# Patient Record
Sex: Female | Born: 1950
Health system: Southern US, Community
[De-identification: ages and names within clinical notes are randomized; demographics above are authoritative.]

## PROBLEM LIST (undated history)

## (undated) DIAGNOSIS — M5136 Other intervertebral disc degeneration, lumbar region: Secondary | ICD-10-CM

## (undated) DIAGNOSIS — K219 Gastro-esophageal reflux disease without esophagitis: Secondary | ICD-10-CM

## (undated) DIAGNOSIS — N189 Chronic kidney disease, unspecified: Secondary | ICD-10-CM

## (undated) DIAGNOSIS — F329 Major depressive disorder, single episode, unspecified: Secondary | ICD-10-CM

## (undated) DIAGNOSIS — I639 Cerebral infarction, unspecified: Secondary | ICD-10-CM

## (undated) DIAGNOSIS — R269 Unspecified abnormalities of gait and mobility: Principal | ICD-10-CM

## (undated) DIAGNOSIS — G971 Other reaction to spinal and lumbar puncture: Secondary | ICD-10-CM

## (undated) DIAGNOSIS — J310 Chronic rhinitis: Secondary | ICD-10-CM

## (undated) DIAGNOSIS — Z5189 Encounter for other specified aftercare: Secondary | ICD-10-CM

## (undated) DIAGNOSIS — F419 Anxiety disorder, unspecified: Secondary | ICD-10-CM

## (undated) DIAGNOSIS — IMO0002 Reserved for concepts with insufficient information to code with codable children: Secondary | ICD-10-CM

## (undated) DIAGNOSIS — H269 Unspecified cataract: Secondary | ICD-10-CM

## (undated) DIAGNOSIS — R0600 Dyspnea, unspecified: Secondary | ICD-10-CM

## (undated) DIAGNOSIS — D649 Anemia, unspecified: Secondary | ICD-10-CM

## (undated) DIAGNOSIS — M978XXA Periprosthetic fracture around other internal prosthetic joint, initial encounter: Secondary | ICD-10-CM

## (undated) DIAGNOSIS — J189 Pneumonia, unspecified organism: Secondary | ICD-10-CM

## (undated) DIAGNOSIS — T7840XA Allergy, unspecified, initial encounter: Secondary | ICD-10-CM

## (undated) DIAGNOSIS — E785 Hyperlipidemia, unspecified: Secondary | ICD-10-CM

## (undated) DIAGNOSIS — G709 Myoneural disorder, unspecified: Secondary | ICD-10-CM

## (undated) DIAGNOSIS — G4730 Sleep apnea, unspecified: Secondary | ICD-10-CM

## (undated) DIAGNOSIS — H44009 Unspecified purulent endophthalmitis, unspecified eye: Secondary | ICD-10-CM

## (undated) DIAGNOSIS — Z96659 Presence of unspecified artificial knee joint: Secondary | ICD-10-CM

## (undated) DIAGNOSIS — S7225XA Nondisplaced subtrochanteric fracture of left femur, initial encounter for closed fracture: Secondary | ICD-10-CM

## (undated) DIAGNOSIS — K922 Gastrointestinal hemorrhage, unspecified: Secondary | ICD-10-CM

## (undated) DIAGNOSIS — K5000 Crohn's disease of small intestine without complications: Secondary | ICD-10-CM

## (undated) DIAGNOSIS — G459 Transient cerebral ischemic attack, unspecified: Secondary | ICD-10-CM

## (undated) DIAGNOSIS — F32A Depression, unspecified: Secondary | ICD-10-CM

## (undated) DIAGNOSIS — I2510 Atherosclerotic heart disease of native coronary artery without angina pectoris: Secondary | ICD-10-CM

## (undated) DIAGNOSIS — I10 Essential (primary) hypertension: Secondary | ICD-10-CM

## (undated) DIAGNOSIS — R569 Unspecified convulsions: Secondary | ICD-10-CM

## (undated) DIAGNOSIS — M1990 Unspecified osteoarthritis, unspecified site: Secondary | ICD-10-CM

## (undated) DIAGNOSIS — E039 Hypothyroidism, unspecified: Secondary | ICD-10-CM

## (undated) DIAGNOSIS — M797 Fibromyalgia: Secondary | ICD-10-CM

## (undated) DIAGNOSIS — E559 Vitamin D deficiency, unspecified: Secondary | ICD-10-CM

## (undated) DIAGNOSIS — R7303 Prediabetes: Secondary | ICD-10-CM

## (undated) DIAGNOSIS — M51369 Other intervertebral disc degeneration, lumbar region without mention of lumbar back pain or lower extremity pain: Secondary | ICD-10-CM

## (undated) DIAGNOSIS — H409 Unspecified glaucoma: Secondary | ICD-10-CM

## (undated) DIAGNOSIS — G2581 Restless legs syndrome: Secondary | ICD-10-CM

## (undated) HISTORY — DX: Unspecified purulent endophthalmitis, unspecified eye: H44.009

## (undated) HISTORY — PX: FRACTURE SURGERY: SHX138

## (undated) HISTORY — DX: Unspecified cataract: H26.9

## (undated) HISTORY — DX: Unspecified convulsions: R56.9

## (undated) HISTORY — DX: Depression, unspecified: F32.A

## (undated) HISTORY — DX: Crohn's disease of small intestine without complications: K50.00

## (undated) HISTORY — PX: TOE SURGERY: SHX1073

## (undated) HISTORY — PX: JOINT REPLACEMENT: SHX530

## (undated) HISTORY — PX: SPINAL CORD DECOMPRESSION: SHX97

## (undated) HISTORY — DX: Unspecified glaucoma: H40.9

## (undated) HISTORY — PX: SPINE SURGERY: SHX786

## (undated) HISTORY — DX: Unspecified abnormalities of gait and mobility: R26.9

## (undated) HISTORY — DX: Chronic rhinitis: J31.0

## (undated) HISTORY — DX: Chronic kidney disease, unspecified: N18.9

## (undated) HISTORY — DX: Other intervertebral disc degeneration, lumbar region: M51.36

## (undated) HISTORY — DX: Encounter for other specified aftercare: Z51.89

## (undated) HISTORY — DX: Fibromyalgia: M79.7

## (undated) HISTORY — DX: Hyperlipidemia, unspecified: E78.5

## (undated) HISTORY — DX: Nondisplaced subtrochanteric fracture of left femur, initial encounter for closed fracture: S72.25XA

## (undated) HISTORY — DX: Restless legs syndrome: G25.81

## (undated) HISTORY — DX: Reserved for concepts with insufficient information to code with codable children: IMO0002

## (undated) HISTORY — DX: Vitamin D deficiency, unspecified: E55.9

## (undated) HISTORY — PX: BACK SURGERY: SHX140

## (undated) HISTORY — DX: Hypothyroidism, unspecified: E03.9

## (undated) HISTORY — PX: FINGER ARTHROSCOPY: SHX5001

## (undated) HISTORY — DX: Gastro-esophageal reflux disease without esophagitis: K21.9

## (undated) HISTORY — PX: EYE SURGERY: SHX253

## (undated) HISTORY — DX: Anxiety disorder, unspecified: F41.9

## (undated) HISTORY — DX: Essential (primary) hypertension: I10

## (undated) HISTORY — DX: Major depressive disorder, single episode, unspecified: F32.9

## (undated) HISTORY — DX: Other intervertebral disc degeneration, lumbar region without mention of lumbar back pain or lower extremity pain: M51.369

## (undated) HISTORY — PX: CATARACT EXTRACTION, BILATERAL: SHX1313

## (undated) HISTORY — PX: SHOULDER ADHESION RELEASE: SHX773

## (undated) HISTORY — DX: Atherosclerotic heart disease of native coronary artery without angina pectoris: I25.10

## (undated) HISTORY — PX: KNEE SURGERY: SHX244

## (undated) HISTORY — DX: Allergy, unspecified, initial encounter: T78.40XA

---
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
## 1990-06-30 HISTORY — PX: ABDOMINAL HYSTERECTOMY: SHX81

## 1998-11-12 ENCOUNTER — Other Ambulatory Visit: Admission: RE | Admit: 1998-11-12 | Discharge: 1998-11-12 | Payer: Self-pay | Admitting: Obstetrics and Gynecology

## 1999-02-07 ENCOUNTER — Ambulatory Visit (HOSPITAL_COMMUNITY): Admission: RE | Admit: 1999-02-07 | Discharge: 1999-02-07 | Payer: Self-pay | Admitting: *Deleted

## 2000-08-04 ENCOUNTER — Ambulatory Visit (HOSPITAL_COMMUNITY): Admission: RE | Admit: 2000-08-04 | Discharge: 2000-08-04 | Payer: Self-pay | Admitting: Otolaryngology

## 2000-08-04 ENCOUNTER — Encounter: Payer: Self-pay | Admitting: Otolaryngology

## 2000-10-08 ENCOUNTER — Other Ambulatory Visit: Admission: RE | Admit: 2000-10-08 | Discharge: 2000-10-08 | Payer: Self-pay | Admitting: Otolaryngology

## 2000-10-08 ENCOUNTER — Encounter (INDEPENDENT_AMBULATORY_CARE_PROVIDER_SITE_OTHER): Payer: Self-pay | Admitting: Specialist

## 2000-12-14 ENCOUNTER — Ambulatory Visit (HOSPITAL_COMMUNITY): Admission: RE | Admit: 2000-12-14 | Discharge: 2000-12-14 | Payer: Self-pay | Admitting: Internal Medicine

## 2000-12-14 ENCOUNTER — Encounter: Payer: Self-pay | Admitting: Internal Medicine

## 2000-12-21 ENCOUNTER — Encounter: Payer: Self-pay | Admitting: Internal Medicine

## 2000-12-21 ENCOUNTER — Ambulatory Visit (HOSPITAL_COMMUNITY): Admission: RE | Admit: 2000-12-21 | Discharge: 2000-12-21 | Payer: Self-pay | Admitting: Internal Medicine

## 2001-01-26 ENCOUNTER — Ambulatory Visit (HOSPITAL_BASED_OUTPATIENT_CLINIC_OR_DEPARTMENT_OTHER): Admission: RE | Admit: 2001-01-26 | Discharge: 2001-01-26 | Payer: Self-pay | Admitting: Otolaryngology

## 2001-01-26 ENCOUNTER — Encounter (INDEPENDENT_AMBULATORY_CARE_PROVIDER_SITE_OTHER): Payer: Self-pay | Admitting: *Deleted

## 2002-03-15 ENCOUNTER — Encounter: Payer: Self-pay | Admitting: Internal Medicine

## 2002-03-15 ENCOUNTER — Ambulatory Visit (HOSPITAL_COMMUNITY): Admission: RE | Admit: 2002-03-15 | Discharge: 2002-03-15 | Payer: Self-pay | Admitting: Internal Medicine

## 2002-10-24 ENCOUNTER — Ambulatory Visit (HOSPITAL_COMMUNITY): Admission: RE | Admit: 2002-10-24 | Discharge: 2002-10-24 | Payer: Self-pay | Admitting: Internal Medicine

## 2002-10-24 ENCOUNTER — Encounter: Payer: Self-pay | Admitting: Internal Medicine

## 2003-07-01 HISTORY — PX: ROTATOR CUFF REPAIR: SHX139

## 2003-10-24 ENCOUNTER — Ambulatory Visit (HOSPITAL_COMMUNITY): Admission: RE | Admit: 2003-10-24 | Discharge: 2003-10-24 | Payer: Self-pay | Admitting: Neurology

## 2003-12-13 ENCOUNTER — Ambulatory Visit (HOSPITAL_COMMUNITY): Admission: RE | Admit: 2003-12-13 | Discharge: 2003-12-13 | Payer: Self-pay | Admitting: Internal Medicine

## 2004-10-18 ENCOUNTER — Ambulatory Visit: Payer: Self-pay | Admitting: Gastroenterology

## 2004-11-01 ENCOUNTER — Ambulatory Visit: Payer: Self-pay | Admitting: Gastroenterology

## 2005-04-15 ENCOUNTER — Encounter: Admission: RE | Admit: 2005-04-15 | Discharge: 2005-04-15 | Payer: Self-pay | Admitting: Orthopedic Surgery

## 2005-04-15 ENCOUNTER — Other Ambulatory Visit: Admission: RE | Admit: 2005-04-15 | Discharge: 2005-04-15 | Payer: Self-pay | Admitting: Internal Medicine

## 2005-05-02 ENCOUNTER — Ambulatory Visit (HOSPITAL_COMMUNITY): Admission: RE | Admit: 2005-05-02 | Discharge: 2005-05-02 | Payer: Self-pay | Admitting: Internal Medicine

## 2005-05-07 ENCOUNTER — Emergency Department (HOSPITAL_COMMUNITY): Admission: EM | Admit: 2005-05-07 | Discharge: 2005-05-07 | Payer: Self-pay | Admitting: *Deleted

## 2006-01-21 ENCOUNTER — Ambulatory Visit: Payer: Self-pay | Admitting: Internal Medicine

## 2006-02-12 ENCOUNTER — Ambulatory Visit: Payer: Self-pay | Admitting: Cardiology

## 2006-02-13 ENCOUNTER — Ambulatory Visit: Payer: Self-pay

## 2006-03-13 ENCOUNTER — Inpatient Hospital Stay (HOSPITAL_COMMUNITY): Admission: AD | Admit: 2006-03-13 | Discharge: 2006-03-18 | Payer: Self-pay | Admitting: Orthopedic Surgery

## 2006-12-28 ENCOUNTER — Encounter (INDEPENDENT_AMBULATORY_CARE_PROVIDER_SITE_OTHER): Payer: Self-pay | Admitting: Otolaryngology

## 2006-12-28 ENCOUNTER — Ambulatory Visit (HOSPITAL_BASED_OUTPATIENT_CLINIC_OR_DEPARTMENT_OTHER): Admission: RE | Admit: 2006-12-28 | Discharge: 2006-12-28 | Payer: Self-pay | Admitting: Otolaryngology

## 2007-03-19 ENCOUNTER — Inpatient Hospital Stay (HOSPITAL_COMMUNITY): Admission: RE | Admit: 2007-03-19 | Discharge: 2007-03-22 | Payer: Self-pay | Admitting: Orthopedic Surgery

## 2007-10-21 ENCOUNTER — Emergency Department (HOSPITAL_COMMUNITY): Admission: EM | Admit: 2007-10-21 | Discharge: 2007-10-21 | Payer: Self-pay | Admitting: Emergency Medicine

## 2007-10-26 ENCOUNTER — Ambulatory Visit (HOSPITAL_COMMUNITY): Admission: RE | Admit: 2007-10-26 | Discharge: 2007-10-26 | Payer: Self-pay | Admitting: Emergency Medicine

## 2007-11-23 ENCOUNTER — Ambulatory Visit (HOSPITAL_COMMUNITY): Admission: RE | Admit: 2007-11-23 | Discharge: 2007-11-23 | Payer: Self-pay | Admitting: Neurosurgery

## 2007-11-25 ENCOUNTER — Ambulatory Visit (HOSPITAL_COMMUNITY): Admission: RE | Admit: 2007-11-25 | Discharge: 2007-11-25 | Payer: Self-pay | Admitting: Internal Medicine

## 2007-12-23 ENCOUNTER — Inpatient Hospital Stay (HOSPITAL_COMMUNITY): Admission: RE | Admit: 2007-12-23 | Discharge: 2007-12-27 | Payer: Self-pay | Admitting: Neurosurgery

## 2008-05-02 ENCOUNTER — Ambulatory Visit (HOSPITAL_COMMUNITY): Admission: RE | Admit: 2008-05-02 | Discharge: 2008-05-02 | Payer: Self-pay | Admitting: Orthopedic Surgery

## 2008-06-01 ENCOUNTER — Ambulatory Visit (HOSPITAL_COMMUNITY): Admission: RE | Admit: 2008-06-01 | Discharge: 2008-06-01 | Payer: Self-pay | Admitting: Orthopedic Surgery

## 2008-06-01 ENCOUNTER — Ambulatory Visit: Payer: Self-pay | Admitting: Surgery

## 2008-06-01 ENCOUNTER — Encounter (INDEPENDENT_AMBULATORY_CARE_PROVIDER_SITE_OTHER): Payer: Self-pay | Admitting: Orthopedic Surgery

## 2008-08-09 ENCOUNTER — Encounter: Admission: RE | Admit: 2008-08-09 | Discharge: 2008-08-09 | Payer: Self-pay | Admitting: Gastroenterology

## 2008-12-18 ENCOUNTER — Inpatient Hospital Stay (HOSPITAL_COMMUNITY): Admission: RE | Admit: 2008-12-18 | Discharge: 2008-12-20 | Payer: Self-pay | Admitting: Orthopedic Surgery

## 2008-12-18 ENCOUNTER — Encounter (INDEPENDENT_AMBULATORY_CARE_PROVIDER_SITE_OTHER): Payer: Self-pay | Admitting: Orthopedic Surgery

## 2009-03-20 ENCOUNTER — Ambulatory Visit (HOSPITAL_COMMUNITY): Admission: RE | Admit: 2009-03-20 | Discharge: 2009-03-20 | Payer: Self-pay | Admitting: Neurology

## 2009-07-24 ENCOUNTER — Ambulatory Visit (HOSPITAL_COMMUNITY)
Admission: RE | Admit: 2009-07-24 | Discharge: 2009-07-24 | Payer: Self-pay | Source: Home / Self Care | Admitting: Internal Medicine

## 2009-08-14 ENCOUNTER — Ambulatory Visit (HOSPITAL_COMMUNITY): Admission: RE | Admit: 2009-08-14 | Discharge: 2009-08-14 | Payer: Self-pay | Admitting: Internal Medicine

## 2009-08-28 ENCOUNTER — Encounter: Admission: RE | Admit: 2009-08-28 | Discharge: 2009-08-28 | Payer: Self-pay | Admitting: Orthopedic Surgery

## 2010-06-21 DIAGNOSIS — M797 Fibromyalgia: Secondary | ICD-10-CM | POA: Insufficient documentation

## 2010-06-21 DIAGNOSIS — G2581 Restless legs syndrome: Secondary | ICD-10-CM | POA: Insufficient documentation

## 2010-06-21 DIAGNOSIS — K219 Gastro-esophageal reflux disease without esophagitis: Secondary | ICD-10-CM | POA: Insufficient documentation

## 2010-06-21 DIAGNOSIS — G4733 Obstructive sleep apnea (adult) (pediatric): Secondary | ICD-10-CM | POA: Insufficient documentation

## 2010-06-21 DIAGNOSIS — I10 Essential (primary) hypertension: Secondary | ICD-10-CM | POA: Insufficient documentation

## 2010-06-21 DIAGNOSIS — K5090 Crohn's disease, unspecified, without complications: Secondary | ICD-10-CM | POA: Insufficient documentation

## 2010-06-25 ENCOUNTER — Encounter: Payer: Self-pay | Admitting: Internal Medicine

## 2010-06-25 ENCOUNTER — Ambulatory Visit: Payer: Self-pay | Admitting: Internal Medicine

## 2010-06-25 DIAGNOSIS — J310 Chronic rhinitis: Secondary | ICD-10-CM

## 2010-06-25 HISTORY — DX: Chronic rhinitis: J31.0

## 2010-06-25 LAB — CONVERTED CEMR LAB: IgE (Immunoglobulin E), Serum: 52.8 intl units/mL (ref 0.0–180.0)

## 2010-07-02 ENCOUNTER — Telehealth (INDEPENDENT_AMBULATORY_CARE_PROVIDER_SITE_OTHER): Payer: Self-pay | Admitting: *Deleted

## 2010-07-11 ENCOUNTER — Ambulatory Visit (HOSPITAL_COMMUNITY)
Admission: RE | Admit: 2010-07-11 | Discharge: 2010-07-11 | Payer: Self-pay | Source: Home / Self Care | Attending: Internal Medicine | Admitting: Internal Medicine

## 2010-08-01 NOTE — Assessment & Plan Note (Signed)
Summary: allergies/jd   Vital Signs:  Patient profile:   60 year old female Weight:      191.38 pounds O2 Sat:      93 % on Room air Pulse rate:   115 / minute BP sitting:   134 / 76  (left arm) Cuff size:   regular  Vitals Entered By: Reynaldo Minium CMA (June 25, 2010 2:32 PM)  O2 Flow:  Room air   Primary Provider/Referring Provider:  Oneta Rack   History of Present Illness: [birth year]89 yoF referred courtesy of Dr Oneta Rack asking allergy evaluation. Nonsmoker who had worked with Dr Rema Jasmine on allergy complaints as far back as 1965 when she was taking antihistamines and allergy shots. She gave her own vaccine then, but kept drifting off. She felt the shots helped but she relapsed each time she stopped. Recently using Claritin-D, nasal sprays, antibiotic ear drops and prednisone. She describes ear pressure and drainage, eyes water and burn, tension type bitemporal headaches, postnasal drip, sneezing. Denies cough, chest tightness, asthma dx or wheeze. Hx Bells Palsy and right eyelid droops. Skin itch on contact with some fragrances, otherwise no allergic skin problem and no problem with latex, foods, insects.  Has cat.  Preventive Screening-Counseling & Management  Alcohol-Tobacco     Smoking Status: never  Allergies (verified): 1)  ! Codeine 2)  ! Sulfa 3)  ! * Emycin 4)  ! * Flaxseed 5)  ! * Pravastain 6)  ! Lipitor 7)  ! Morphine 8)  ! Nsaids  Past History:  Family History: Last updated: 06/25/2010 Heart Disease: father-bypass, mother-pacemaker uncle(deceased) MI RA: mother-osetoarthritis,sister-lupus, sister-RA Cancer-mother-blood disorder, sister-luekimia  Social History: Last updated: 06/25/2010 Retired Engineer, site- disbility Married with 2 grown children ETOH-rare to social Patient never smoked.   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  Smoking Status:  never  Review of Systems      See HPI       The patient complains of shortness of breath with activity, shortness of breath at rest, acid heartburn, indigestion, abdominal pain, headaches, nasal congestion/difficulty breathing through nose, sneezing, itching, ear ache, hand/feet swelling, and joint stiffness or pain.  The patient denies productive cough, non-productive cough, coughing up blood, chest pain, irregular heartbeats, loss of appetite, weight change, difficulty swallowing, sore throat, tooth/dental problems, anxiety, depression, rash, change in color of mucus, and fever.    Physical Exam  Additional Exam:  General: A/Ox3; pleasant and cooperative, NAD, SKIN: no rash, lesions NODES: no lymphadenopathy HEENT: [place]/AT, EOM- WNL, Conjuctivae- clear, PERRLA, EARS- crust and debris in canals, right TM is red, not bulging, Nose- clear, Throat- clear and wnl, Mallampati  II NECK: Supple w/ fair ROM, JVD- none, normal carotid impulses w/o bruits Thyroid- normal to palpation CHEST: Clear to P&A HEART: RRR, no m/g/r heard ABDOMEN: Soft and nl; nml bowel sounds; no organomegaly or masses noted WNU:UVOZ, nl pulses, no edema  NEURO: Grossly intact to observation      Impression & Recommendations:  Problem # 1:  ALLERGIC RHINITIS (ICD-477.9) Chronic perennial and seasonal rhinitis by description. She  is not clear now about environmental  triggers as opposed to irritants. We will inventory her IgE status. I have begun discussion of environmental dust and mold precautions.   Problem # 2:  OTITIS EXTERNA (ICD-380.10) She has been treated and is encouraged to resume and finish prescribed ear drops. If she fails to clear, it may be eczema or fungal rather than allergic, and she may benefit from ENT.   Problem # 3:  OBSTRUCTIVE SLEEP APNEA (ICD-327.23)  We disciussed this briefly and can work with her on it if needed.   Medications Added to Medication List This Visit: 1)  Omeprazole 20 Mg Cpdr (Omeprazole) .... Take 1 by mouth once daily 2)  Neurontin 300 Mg Caps (Gabapentin) .... Take 1 by mouth four times a day  Other Orders: New Patient Level IV (72536) T-Allergy Profile Region II-[place], [place], [place], [place], [place] [phone])  Patient Instructions: 1)  Return as able for allergy skin testing. Stop all antihistamines 3 days before skin testing, including cold and allergy meds, otc sleep and cough meds.  2)  Lab 3)  Please use up your current ear drop as instructed.  4)  We can work on your sleep apnea issues if needed.    Orders Added: 1)  New Patient Level IV [34742] 2)  T-Allergy Profile Region [place], [place], [place], [place], [place] [[id number]]

## 2010-08-01 NOTE — Progress Notes (Signed)
Summary: results/cb  Phone Note Call from Patient Call back at Home Phone [phone]   Caller: Patient Call For: young Summary of Call: wants results of labs Initial call taken by: Lacinda Axon,  July 02, 2010 11:13 AM  Follow-up for Phone Call        Pls advise recent labs from 06/25/10, thanks! Follow-up by: Vernie Murders,  July 02, 2010 2:34 PM  Additional Follow-up for Phone Call Additional follow up Details #1::        Allergy profile does not show elevation of circulating antibodies in her blood stream. This makes allergy less likely, but doesn't rule it out. I can discuss with her when she comes in February.  Additional Follow-up by: Waymon Budge MD,  July 02, 2010 5:29 PM    Additional Follow-up for Phone Call Additional follow up Details #2::    Pt is aware of results and appt with CDY-will go over more in detail at Feb appt.Reynaldo Minium CMA  July 02, 2010 5:35 PM

## 2010-08-20 ENCOUNTER — Telehealth: Payer: Self-pay | Admitting: Internal Medicine

## 2010-08-20 ENCOUNTER — Institutional Professional Consult (permissible substitution): Payer: Self-pay | Admitting: Internal Medicine

## 2010-09-05 NOTE — Progress Notes (Signed)
Summary: allergy skin test date-LMTCB x 1  Phone Note Call from Patient   Caller: Patient Call For: young Reason for Call: Talk to Nurse Summary of Call: Wants to talk to [facility] in ref to her allergy testing. Initial call taken by: Netta Neat,  August 20, 2010 12:22 PM  Follow-up for Phone Call        Pt had to resched allergy testing and first opening was 3/26.  She is not happy with this date and would like to have this done sooner.  Pls advise thanks Follow-up by: Tilden Dome,  August 20, 2010 12:37 PM  Additional Follow-up for Phone Call Additional follow up Details #1::        I have called and left message for pt to call back to see if 09-17-2010 at 215pm will work for pt otherwise it will be first part of April for next skin testing day. Pt to call back and ask for Katie as I have held the slot for pt.Clayborne Dana CMA  August 21, 2010 3:07 PM     Additional Follow-up for Phone Call Additional follow up Details #2::    Patient calling back. #[phone]  Mateo Flow  August 22, 2010 10:33 AM   After reviewing the schedule-09-17-10 is our "go-live" for Epic and will not work so we need to work with pt for next open slot to do an allergy test-please call pt. Thanks.Clayborne Dana CMA  August 23, 2010 1:51 PM   LMOMTCB Tilden Dome  August 23, 2010 1:52 PM  pt ok with keeping 09-23-10 appt.Big [place]pp[place] Bing CMA  August 26, 2010 5:45 PM

## 2010-09-13 ENCOUNTER — Encounter: Payer: Self-pay | Admitting: Internal Medicine

## 2010-09-23 ENCOUNTER — Ambulatory Visit (INDEPENDENT_AMBULATORY_CARE_PROVIDER_SITE_OTHER): Payer: Medicare Other | Admitting: Internal Medicine

## 2010-09-23 ENCOUNTER — Encounter: Payer: Self-pay | Admitting: Internal Medicine

## 2010-09-23 VITALS — BP 112/58 | HR 80 | Ht 64.5 in | Wt 191.4 lb

## 2010-09-23 DIAGNOSIS — J309 Allergic rhinitis, unspecified: Secondary | ICD-10-CM

## 2010-09-23 NOTE — Assessment & Plan Note (Signed)
Most of her described situational exacerbations, together with the negative IgE tests, favor a nonallergic rhinitis. It may still help to avoid dusts, heavy pollens and other irritant exposures.  She was disappointed that testing did not confirm her expected explanation for her symptoms, however the tests are mutually confirmatory.

## 2010-09-23 NOTE — Patient Instructions (Signed)
Allergy evaluation by blood test "Allergy profile" and by allergy skin testing does not show significant allergy by the usual IgE pathway. You can still use antihistamines, decongestants and saline nasal rinse like Neti pot.  You can use dust barrrier encasings on your pillow and mattress, and maybe a HEPA- type air cleaner in your room as ways to reduce pollens and dust exposure, to see if that helps.   If you remain very suspicious that an allergy process is important, we can look again, maybe in a different season.

## 2010-09-23 NOTE — Progress Notes (Signed)
  Subjective:    Patient ID: Crystal [name], female    DOB: [birth year], 60 y.o.   MRN: [id number]  HPI 79 yoF followed here for allergic rhinitis, obstructive sleep apnea and hx of otitis. Last here 06/26/11 on referral from Dr Oneta Rack with complaits o nasal and ear pressure, postnasal drip. Coming now for allergy skin testing. Notices watering eyes, sneeze off antihistamines. Just finished 2 rounds of antibiotics for otitis interna/ externa with Dr C. Newman.. Had been on plane trip to [place] just before this and we discussed barotrauma, climate changes. Allergy profile 06/26/10- negative, w/ total IgE 52.8.  Skin test: Appropriate controls. Punctures- negative Intradermals- negative   Review of Systems    Constitutional:   No weight loss, night sweats,  Fevers, chills, fatigue, lassitude. HEENT:   No headaches,  Difficulty swallowing,  Tooth/dental problems,  Sore throat,                CV:  No chest pain,  Orthopnea, PND, swelling in lower extremities, anasarca, dizziness, palpitations  GI  No heartburn, indigestion, abdominal pain, nausea, vomiting, diarrhea, change in bowel habits, loss of appetite  Resp: No shortness of breath with exertion or at rest.  No excess mucus, no productive cough,  No non-productive cough,  No coughing up of blood.  No change in color of mucus.  No wheezing.  No chest wall deformity  Skin: no rash or lesions.  GU: no dysuria, change in color of urine, no urgency or frequency.  No flank pain.  MS:  No joint pain or swelling.  No decreased range of motion.  No back pain.  Psych:  No change in mood or affect. No depression or anxiety.  No memory loss.  Objective:   Physical Exam General- Alert, Oriented, Affect-appropriate, Distress- none acute  Skin- rash-none, lesions- none, excoriation- none. Clammy diaphoresis  Lymphadenopathy- none  Head- atraumatic  Eyes- Gross vision intact, PERRLA, conjunctivae clear, secretions clear  Ears- Normal-  Hearing, canals, Tm L ,   R ,  Nose- Clear, Septal dev, mucus, polyps, erosion, perforation   Throat- Mallampati II , mucosa clear , drainage- none, tonsils- atrophic  Neck- flexible , trachea midline, no stridor , thyroid nl, carotid no bruit  Chest - symmetrical excursion , unlabored     Heart/CV- RRR , no murmur , no gallop  , no rub, nl s1 s2                     - JVD- none , edema- none, stasis changes- none, varices- none     Lung- clear to P&A, wheeze- none, cough- none , dullness-none, rub- none     Chest wall-   Abd- tender-no, distended-no, bowel sounds-present, HSM- no  Br/ Gen/ Rectal- Not done, not indicated  Extrem- cyanosis- none, clubbing, none, atrophy- none, strength- nl  Neuro- hx Bell's Palsey w/ droop right lid         Assessment & Plan:

## 2010-10-07 LAB — CBC
HCT: 26.4 % — ABNORMAL LOW (ref 36.0–46.0)
HCT: 29.8 % — ABNORMAL LOW (ref 36.0–46.0)
HCT: 40.2 % (ref 36.0–46.0)
Hemoglobin: 13 g/dL (ref 12.0–15.0)
Hemoglobin: 8.8 g/dL — ABNORMAL LOW (ref 12.0–15.0)
Hemoglobin: 9.9 g/dL — ABNORMAL LOW (ref 12.0–15.0)
MCHC: 32.4 g/dL (ref 30.0–36.0)
MCHC: 33.2 g/dL (ref 30.0–36.0)
MCHC: 33.2 g/dL (ref 30.0–36.0)
MCV: 85.5 fL (ref 78.0–100.0)
MCV: 85.8 fL (ref 78.0–100.0)
MCV: 86 fL (ref 78.0–100.0)
Platelets: 231 10*3/uL (ref 150–400)
Platelets: 252 10*3/uL (ref 150–400)
Platelets: 333 10*3/uL (ref 150–400)
RBC: 3.07 MIL/uL — ABNORMAL LOW (ref 3.87–5.11)
RBC: 3.47 MIL/uL — ABNORMAL LOW (ref 3.87–5.11)
RBC: 4.71 MIL/uL (ref 3.87–5.11)
RDW: 16.6 % — ABNORMAL HIGH (ref 11.5–15.5)
RDW: 16.7 % — ABNORMAL HIGH (ref 11.5–15.5)
RDW: 16.7 % — ABNORMAL HIGH (ref 11.5–15.5)
WBC: 11.3 10*3/uL — ABNORMAL HIGH (ref 4.0–10.5)
WBC: 6.8 10*3/uL (ref 4.0–10.5)
WBC: 8.8 10*3/uL (ref 4.0–10.5)

## 2010-10-07 LAB — COMPREHENSIVE METABOLIC PANEL
ALT: 15 U/L (ref 0–35)
AST: 18 U/L (ref 0–37)
Albumin: 4.1 g/dL (ref 3.5–5.2)
Alkaline Phosphatase: 45 U/L (ref 39–117)
BUN: 16 mg/dL (ref 6–23)
CO2: 30 mEq/L (ref 19–32)
Calcium: 9.1 mg/dL (ref 8.4–10.5)
Chloride: 96 mEq/L (ref 96–112)
Creatinine, Ser: 0.9 mg/dL (ref 0.4–1.2)
GFR calc Af Amer: >60 mL/min (ref >60)
GFR calc non Af Amer: >60 mL/min (ref >60)
Glucose, Bld: 95 mg/dL (ref 70–99)
Potassium: 3.9 mEq/L (ref 3.5–5.1)
Sodium: 135 mEq/L (ref 135–145)
Total Bilirubin: 0.4 mg/dL (ref 0.3–1.2)
Total Protein: 7.3 g/dL (ref 6.0–8.3)

## 2010-10-07 LAB — DIFFERENTIAL
Basophils Absolute: 0.1 10*3/uL (ref 0.0–0.1)
Basophils Relative: 1 % (ref 0–1)
Eosinophils Absolute: 0.1 10*3/uL (ref 0.0–0.7)
Eosinophils Relative: 1 % (ref 0–5)
Lymphocytes Relative: 14 % (ref 12–46)
Lymphs Abs: 1.5 10*3/uL (ref 0.7–4.0)
Monocytes Absolute: 0.8 10*3/uL (ref 0.1–1.0)
Monocytes Relative: 7 % (ref 3–12)
Neutro Abs: 8.9 10*3/uL — ABNORMAL HIGH (ref 1.7–7.7)
Neutrophils Relative %: 78 % — ABNORMAL HIGH (ref 43–77)

## 2010-10-07 LAB — ANAEROBIC CULTURE

## 2010-10-07 LAB — CROSSMATCH
ABO/RH(D): A NEG
Antibody Screen: NEGATIVE

## 2010-10-07 LAB — URINE CULTURE
Colony Count: NO GROWTH
Culture: NO GROWTH

## 2010-10-07 LAB — URINE MICROSCOPIC-ADD ON

## 2010-10-07 LAB — URINALYSIS, ROUTINE W REFLEX MICROSCOPIC
Bilirubin Urine: NEGATIVE
Glucose, UA: NEGATIVE mg/dL
Ketones, ur: NEGATIVE mg/dL
Leukocytes, UA: NEGATIVE
Nitrite: NEGATIVE
Protein, ur: NEGATIVE mg/dL
Specific Gravity, Urine: 1.022 (ref 1.005–1.030)
Urobilinogen, UA: 0.2 mg/dL (ref 0.0–1.0)
pH: 5.5 (ref 5.0–8.0)

## 2010-10-07 LAB — BASIC METABOLIC PANEL
BUN: 12 mg/dL (ref 6–23)
BUN: 16 mg/dL (ref 6–23)
CO2: 31 mEq/L (ref 19–32)
CO2: 32 mEq/L (ref 19–32)
Calcium: 8 mg/dL — ABNORMAL LOW (ref 8.4–10.5)
Calcium: 8.1 mg/dL — ABNORMAL LOW (ref 8.4–10.5)
Chloride: 101 mEq/L (ref 96–112)
Chloride: 99 mEq/L (ref 96–112)
Creatinine, Ser: 0.97 mg/dL (ref 0.4–1.2)
Creatinine, Ser: 1.09 mg/dL (ref 0.4–1.2)
GFR calc Af Amer: >60 mL/min (ref >60)
GFR calc Af Amer: >60 mL/min (ref >60)
GFR calc non Af Amer: 51 mL/min — ABNORMAL LOW (ref >60)
GFR calc non Af Amer: 59 mL/min — ABNORMAL LOW (ref >60)
Glucose, Bld: 116 mg/dL — ABNORMAL HIGH (ref 70–99)
Glucose, Bld: 128 mg/dL — ABNORMAL HIGH (ref 70–99)
Potassium: 3.9 mEq/L (ref 3.5–5.1)
Potassium: 4.3 mEq/L (ref 3.5–5.1)
Sodium: 136 mEq/L (ref 135–145)
Sodium: 136 mEq/L (ref 135–145)

## 2010-10-07 LAB — SYNOVIAL CELL COUNT + DIFF, W/ CRYSTALS
Crystals, Fluid: NONE SEEN
Eosinophils-Synovial: 0 % (ref 0–1)
Lymphocytes-Synovial Fld: 11 % (ref 0–20)
Monocyte-Macrophage-Synovial Fluid: 11 % — ABNORMAL LOW (ref 50–90)
Neutrophil, Synovial: 78 % — ABNORMAL HIGH (ref 0–25)
WBC, Synovial: 2400 /mm3 — ABNORMAL HIGH (ref 0–200)

## 2010-10-07 LAB — BODY FLUID CULTURE: Culture: NO GROWTH

## 2010-10-07 LAB — GRAM STAIN

## 2010-10-07 LAB — PROTIME-INR
INR: 1 (ref 0.00–1.49)
Prothrombin Time: 12.8 seconds (ref 11.6–15.2)

## 2010-10-07 LAB — APTT: aPTT: 30 seconds (ref 24–37)

## 2010-10-10 ENCOUNTER — Encounter: Payer: Self-pay | Admitting: Internal Medicine

## 2010-11-12 NOTE — Op Note (Signed)
NAMERAJA, CAPUTI NO.:  [id number]   MEDICAL RECORD NO.:  [id number]          PATIENT TYPE:  INP   LOCATION:  2550                         FACILITY:  [facility]   PHYSICIAN:  Lockie Pares, M.D.    DATE OF BIRTH:  Aug 19, 1950   DATE OF PROCEDURE:  03/19/2007  DATE OF DISCHARGE:                               OPERATIVE REPORT   PREOPERATIVE DIAGNOSIS:  Osteoarthritis right knee.   POSTOPERATIVE DIAGNOSIS:  Osteoarthritis right knee.   OPERATION:  Right total knee replacement (LCS  standard femur, patella,  2.5 tibia with 15 mm bearing).   SURGEON:  Lockie Pares, M.D.   ASSISTANT:  Vonita Moss. Duffy, P.A.   TOURNIQUET TIME:  Approximately 1 hour 30 minutes.   DESCRIPTION OF PROCEDURE:  Sterile prep and drape.  Exsanguination of  leg, and placed at 375 mmHg.  Straight skin incision was made.  Medial  parapatellar approach to the knee made.  The tibia was cut with a 7-  degree posterior slope, followed by anterior-posterior femoral cut, and  a 15 mm bearing.  The medial side had to be stripped off relative to a  varus deformity.  Thereafter, a 4-degree distal valgus cut was made, and  a finishing guide was next used on the femur. Remnants of the menisci  were removed.  PCL was released.  Attention was next directed at the  tibia.  The tibia was sized to be 2.5 tibia.  Nyoka Lint was cut.  The  prosthesis trial was placed and the trial of the femur. Patella was cut,  resecting about 11 mm of the patella for a standard patella.  A 3-peg  patella was used and trialed.  A 15 bearing noted full extension.  No  instability, good alignment, and no instability in flexion as well.  Final components were next inserted, with the trials removed with the  exception of the bearing.  Prior to this, thrombin paste was placed into  the knee for blood control.  The capsule was infiltrated with 10 cc of  0.25% Marcaine with epinephrine.  Again, the cement was allowed to  harden.  Excess  cement was removed.  The tourniquet was released.  No  excess bleeding was noted.  Final bearing placed.  The Hemovac drain was  placed deep to the capsule, with closure with #1 Ethibond and 2-0 Vicryl  skin clips.  Hemovac was clamped.  Light pressure sterile dressing and  knee immobilizer applied.  Taken to the recovery room in stable  addition.      Lockie Pares, M.D.  Electronically Signed     WDC/MEDQ  D:  03/19/2007  T:  03/19/2007  Job:  [id number]

## 2010-11-12 NOTE — Op Note (Signed)
Crystal, [name] NO.:  [id number]   MEDICAL RECORD NO.:  [id number]          PATIENT TYPE:  INP   LOCATION:  2899                         FACILITY:  [facility]   PHYSICIAN:  Estill Bamberg. Ronnie Derby, M.D. DATE OF BIRTH:  1951-05-07   DATE OF PROCEDURE:  12/18/2008  DATE OF DISCHARGE:                               OPERATIVE REPORT   SURGEON:  Estill Bamberg. Ronnie Derby, MD   ASSISTANTS:  1. Carlynn Spry, PA-C  2. Lowell Guitar. Mancel Bale, PA-C   ANESTHESIA:  General.   PREOPERATIVE DIAGNOSIS:  Failed left total knee arthroplasty.   POSTOPERATIVE DIAGNOSIS:  Failed left total knee arthroplasty.   PROCEDURE:  Left removal of prosthesis and revision total knee  arthroplasty.   INDICATIONS FOR PROCEDURE:  The patient is a 60 year old white female I  believe a year status post a primary total knee arthroplasty but now  with evidence of loosening and no clinical evidence of infection.  Informed consent was obtained.   DESCRIPTION OF PROCEDURE:  The patient was laid spine and administered  general anesthesia.  Left leg prepped and draped in usual sterile  fashion.  Extremity exsanguinated with the Esmarch and the tourniquet  inflated to 350 mmHg.  I then elevated the tourniquet and then made the  midline incision over the old incision.  At this point, I opened up the  knee and sent off fluid for stat Gram-stain which was negative.  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  I debrided the entire synovium.  The  large were loose.  I easily removed them and then removed all the excess  cement and soft tissue.  There was some destruction of the femur.  There  was type A for sure as was the tibia with some posterior medial bone  loss on the tibia.  After getting all the soft tissue out and getting  all the cement out, I then sequentially reamed to 12 on the tibia and 16  on the femur.  I then used the 16 with a size D femur to cut my notch.  I sagittally sized the D.  I then trialed with the  D femur, 16 stem, and  found that I needed a posterolateral 5 mm augment.  This was cut through  the trial femoral component and this fit very very well.  I then turned  my attention to the tibia where I placed a 12-mm reamer down and then  the extramedullary alignment and cut about 6 mm off my lateral side of  the tibia and basically this freshened up the medial side.  I then  drilled with the top  and keeled to a size 3 tibial tray and then  trialed with a 3 tibia.  I did use an offset on the tibia to medialize  the component on the shaft and then the D femur with a 16 stem and then  I had to use a 23-mm CCK.  Chronically after evaluating the components I  removed, it was obvious that the mobile bearing patella had what seems  foot vertically.  There was metal-on-metal rubbing in the  patellofemoral  compartment causing the metallosis.  It is my assumption that the metal  debris created the loosening and fall into varus and stretched out the  lateral structures where a CCK constrained device was necessary.  I then  removed the trial components, copiously irrigated,  and then cemented  the Palamed G cement, removed all excess cement, and allowed the cement  to harden in extension.  I left a Hemovac coming out superolaterally and  deep to the arthrotomy.  Pain catheter coming out superomedially and  superficial to the arthrotomy.  I then let the tourniquet down, obtained  hemostasis at 2 hours and 5 minutes.  I then irrigated again and closed  the arthrotomy with figure-of-eight #1 Vicryl sutures, deep soft tissue  with buried with 0 Vicryl sutures, subcuticular 2-0 Vicryl stitch and  skin staples.   COMPLICATIONS:  None.   DRAINS:  One Hemovac and one pain catheter   ESTIMATED BLOOD LOSS:  300 mL.           ______________________________  Estill Bamberg. Ronnie Derby, M.D.     SDL/MEDQ  D:  12/18/2008  T:  12/19/2008  Job:  [id number]

## 2010-11-12 NOTE — Op Note (Signed)
NAMEVEGA, STARE NO.:  [id number]   MEDICAL RECORD NO.:  [id number]          PATIENT TYPE:  INP   LOCATION:  3025                         FACILITY:  [facility]   PHYSICIAN:  Marchia Meiers. Vertell Limber, M.D.  DATE OF BIRTH:  June 11, 1951   DATE OF PROCEDURE:  12/23/2007  DATE OF DISCHARGE:                               OPERATIVE REPORT   PREOPERATIVE DIAGNOSIS:  Herniated lumbar disk L4-5 with spondylosis,  spondylolisthesis, degenerative disk disease, back pain and lumbar  radiculopathy.   POSTOPERATIVE DIAGNOSIS:  Herniated lumbar disk L4-5 with spondylosis,  spondylolisthesis, degenerative disk disease, back pain and lumbar  radiculopathy.   PROCEDURE:  1. Exploration and fusion L4-5 through S1  2. Diskectomy L4-5.  3. Transforaminal lumbar interbody fusion with PEEK cage autograft,      Fortoss and bone morphogenic protein.  4. Nonsegmental pedicle screw fixation L4 through L5.  5. Posterolateral arthrodesis L4-S1 levels utilizing bone morphogenic      protein, autograft and Fortoss.   SURGEON:  Marchia Meiers. Vertell Limber, MD   ASSISTANTS:  1. Verdis Prime, RN  2. Otilio Connors, MD   ANESTHESIA:  General endotracheal anesthesia.   ESTIMATED BLOOD LOSS:  680 mL   COMPLICATIONS:  None.   DISPOSITION:  Recovery.   INDICATIONS:  Crystal [name] is a 60 year old woman who previously  undergone lumbar decompression and fusion at L5-S1 levels.  She presents  now with back pain and left lumbar radiculopathy with a large disk  herniation at L4-5 on the left.  It was elected to take her to surgery  to explore previous fusion and to decompress and fuse the L4-5 level.   PROCEDURE:  Crystal [name] was brought to the operating room.  Following  satisfactory and uncomplicated induction of general endotracheal  anesthesia plus intravenous lines, the patient was placed in a prone  position on the [place] table.  Her low back was then prepped and draped  in the usual sterile fashion  using C-arm in the midline incision after  infiltrating skin, subcutaneous tissues with local lidocaine with  epinephrine.  The L4-L5 transverse processes were exposed as were the  sacral alae bilaterally.  Intraoperative x-ray confirmed correct  orientation.  I did not took down the lateral fusion mass, did not  detect significant motion at the L5-S1 level.  But because Crystal [name]  procedure had been performed previously, it was not possible to distract  the spinous process to confirm with certainty that there had been  satisfactory arthrodesis.  Therefore, it was elected to extend the  posterolateral arthrodesis from L4-S1 levels at conclusion of the  decompression and fusion.  Subsequently, a hemilaminectomy of L4 was  performed with high-speed drill and Kerrison rongeurs and dense previous  scar tissue was carefully dissected free.  The lateral thecal sac at L5  nerve root decompressed.  Inferior facet of L4 was removed from the  left.  There appeared to be a free fragment of herniated disk material  which was directly compressing the takeoff of the left L5 nerve root and  this was carefully decompressed and disk material was removed.  The  interspace was then incised with 15 blade and disk material was removed  in piecemeal fashion.  Endplates were stripped of residual disk material  and prepared for grafting.  Subsequently, using a variety of ring  curettes, the endplates were stripped of residual disk material and  after trial sizing, a 9 mm x 28-mm PEEK banana shaped cage was selected,  packed with small BMP and Fortoss.  Additionally BMP and Fortoss were  placed deep to the cage and cage was then inserted and rotated into  position in transverse fashion without difficulty protecting the common  dural tube and L5 nerve roots.  Subsequently, additional bone autograft  was placed overlying the cage and tamped into position.  Intraoperative  fluoroscopy confirmed well-positioned cage with a  good amount of bone  graft around the cage within the interspace and subsequently L4-L5  pedicle screws were placed using 40 x 5.5-mm screws at L5, 45 x 5.5-mm  screws at L4, the sacral ala at L4 and L5 transverse processes were  decorticated with high-speed drill prior to placing the screws.  After  confirming the positioning on AP and lateral fluoroscopy, the left L5  screw was directed more medially and final x-ray demonstrated well-  positioned interbody grafts and pedicle screw fixation at these  operative levels.  Remaining BMP soaked sponge was then placed from L4-  S1 on the right and remaining Fortoss and bone autograft were then  placed overlying this and the decorticated lamina and facet joint  complex at L4-5.  Preloaded rods were then placed and locked down in  situ.  The self-retaining tractor was removed.  The lumbodorsal fascia  was closed with 1Vicryl sutures, subcutaneous tissues were approximated  with 2-0 Vicryl interrupted inverted sutures and the skin edges were  approximated with interrupted 3-0 Vicryl subcuticular stitch.  The wound  was dressed with Benzoin, Steri-Strips, Telfa gauze and tape.  The  patient was extubated in the operating room, returned to recovery in  stable satisfactory condition having tolerated the operation well.  Counts were correct at the end of the case.      Marchia Meiers. Vertell Limber, M.D.  Electronically Signed     JDS/MEDQ  D:  12/23/2007  T:  12/24/2007  Job:  [id number]

## 2010-11-12 NOTE — Op Note (Signed)
Crystal [name], Crystal [name]             ACCOUNT NO.:  [id number]   MEDICAL RECORD NO.:  [id number]          PATIENT TYPE:  AMB   LOCATION:  [place]                          FACILITY:  [facility]   PHYSICIAN:  Christopher E. Lucia Gaskins, M.D.DATE OF BIRTH:  1951/06/12   DATE OF PROCEDURE:  12/28/2006  DATE OF DISCHARGE:                               OPERATIVE REPORT   PREOPERATIVE DIAGNOSIS:  Left lower lip lesion.   POSTOPERATIVE DIAGNOSIS:  Left lower lip lesion.   OPERATION:  Excision of left lower lip lesion, 2.5 x 1.5 cm.   SURGEON:  Leonides Sake. Lucia Gaskins, M.D.   ANESTHESIA:  Local, 1% Xylocaine with epinephrine.   COMPLICATIONS:  None.   BRIEF NOTE:  Crystal [name] is a 60 year old female who accidentally bit her  lip during a nightmare about a year ago.  She developed a reactive  fibroma or excessive scar tissue of the left lower lip that she  intermittently bites.  She thought it would gradually get smaller, but  it has stayed the same size or gotten a little bit larger.  It measures  approximately 2.5 x 1.5 cm, exophytic, firm lesion of the left lower  lip.  She is taken to the operating room at this time for excision of  the left lower lip lesion under local anesthetic.   DESCRIPTION OF PROCEDURE:  Lip was injected with 3 mL of Xylocaine with  epinephrine for local anesthetic.  The lesion was then elliptically  excised and sent to Pathology.  Deep edge was closed with 4-0 chromic  sutures, two sutures in the submucosal layer and then approximately 10  interrupted 4-0 chromic sutures on the mucous membrane.  This completed  the procedure.  Crystal [name] tolerated this well and was discharged home.   DISPOSITION:  Will have her follow up in my office in 10 days for  recheck.  Review pathology.  Instructed to apply a cool compress if she  has any bleeding.  Given Tylenol and Vicodin p.r.n. pain.           ______________________________  Leonides Sake. Lucia Gaskins, M.D.     CEN/MEDQ  D:   12/28/2006  T:  12/28/2006  Job:  [id number]

## 2010-11-15 NOTE — Op Note (Signed)
NAMESAUNDRA, [name] NO.:  [id number]   MEDICAL RECORD NO.:  [id number]          PATIENT TYPE:  INP   LOCATION:  2899                         FACILITY:  [facility]   PHYSICIAN:  Lockie Pares, M.D.    DATE OF BIRTH:  [birth year]   DATE OF PROCEDURE:  03/13/2006  DATE OF DISCHARGE:                                 OPERATIVE REPORT   PREOPERATIVE DIAGNOSIS:  Severe osteoarthritis, left knee, with varus  deformity.   POSTOPERATIVE DIAGNOSIS:  Severe osteoarthritis, left knee, with varus  deformity.   OPERATION:  Left total knee replacement using OCS standard femur and  patella, size 2.5 tibia with 15-mm bearing.   SURGEON:  Lockie Pares, M.D.   ASSISTANT:  Aaron Edelman D. Petrarca, P.A.-C.   TOURNIQUET TIME:  1 hour 20 minutes.   DESCRIPTION OF PROCEDURE:  Sterile prep and drape, exsanguination of the  left, inflation to 350.  Straight skin incision, medial parapatellar  approach to the knee mach.  Stripping of the medial side to release some  mild varus contracture to the knee.  Cutting of the tibia was done with the  7-degree sloped cut 2 mm below the most diseased medial compartment.  The  anterior and posterior femoral cuts were made for a 15-mm flexion gap to be  equal to the 15-mm extension gap, with a 4-degree distal valgus cut.  Excess  menisci were removed.  Release and resection of the PCL and ACL were carried  out.  The finishing guide was placed, with the finishing cuts being made,  followed by attention being directed at the tibia.  Sized to be a 2.5 tibia,  with the keel hole being cut for the tibia.  Again, once the tibial tray was  placed, a 15-mm bearing was placed.  With the femoral trial in place, the  patella was cut, leaving about 14 mm of native patella for a 3-peg patella.  Patellar trial was placed.  Full extension was obtained on the table.  Excellent stability in flexion and extension.  No tendency for the bearing  to spin out, with 2 towel  clips applied to the extensor mechanism.  The  trial components were removed and the final components were inserted, with  the exception of the bearing.  Excess cement was removed, and then excess  cement was checked on the posterior aspect of the knee with the trial  bearing removed.  The final bearing was placed with a 15-mm bearing, and all  perimeters of stability in flexion and extension were seen to be  appropriate.  The cement was inserted with 1.5 g of Zinacef per batch for  antibiotic-impregnated cement.  A Hemovac drain was placed deep in the intra-  articular portion.  The capsule was infiltrated with 30 cc of 0.25%  Marcaine.  Interrupted Ethibond used on the capsule, 2-0 Vicryl and skin  clips.  Hemovac, again, had been placed.  Lightly compressive sterile  dressing and knee immobilizer applied.  Taken to the recovery room in stable  condition.      Lockie Pares, M.D.  Electronically Signed  WDC/MEDQ  D:  03/13/2006  T:  03/13/2006  Job:  [id number]

## 2010-11-15 NOTE — Assessment & Plan Note (Signed)
[facility]                               PULMONARY OFFICE NOTE   GEMINI, BUNTE                    MRN:          [id number]  DATE:01/21/2006                            DOB:          [birth year]    PROBLEM:  60 year old woman complains of sleep-related movements and  disorders.   HISTORY:  She says she experienced a transient ischemic attack 2 years ago,  which had produced some numbness in her cheek.  She had a negative  neurologic workup at that time, and follows with Dr. Erling Cruz, who is to see her  again in August.  Within about a month or two after that episode, her  husband began to complain that she was tossing and turning a lot in her  sleep, then kicking, then gradually beginning to bicycle with her legs and  to do karate kicks.  Her sister, a Marine scientist, spent a night with her at one  point, and told her it was like a seizure.  The patient thinks both legs  start jerking some before she falls asleep.  She began to talk in her sleep,  and did some limited sleep walking.  She tried Requip and Ambien, then quit  Ambien because of concern about the sleep walking, which did not improve.  Subsequently she failed Lunesta, Rozerem and all the others given to help  consolidate sleep and stop the activity.  Requip by itself initially was a  big help at 1 mg, but lately she is told that her legs are moving again, and  she is humming in her sleep.  She had a nocturnal polysomnogram on 2 nights  at the [facility] on [facility].  She says there was  marked leg activity and mild sleep apnea with an AHI of 6 per hour by her  description.  She was fitted with CPAP at an unknown pressure, and has been  using that every night.  She was told at the time of her sleep study that  she had no REM.  She has little or no dream recall currently.  Bedtime is  usually between 10:00 and 11:00 p.m. with variable latency to sleep onset.  She thinks  she wakes enough to be aware about twice a night, and gets up  between 7:00 and 9:00 a.m.   REVIEW OF SYSTEMS:  Snoring and witnessed apneas, difficulty with sleep  onset, crawling in legs and leg movement at night, daytime sleepiness, leg  and body jerks, some choking or gasping that wakes her from sleep, daytime  naps.  No daytime hallucination or vivid dreams.  Depression.  Indigestion  is helped by Prevacid.  In the mornings she tends to wake with numbness  below her knees and sharp pains in her feet, for which she uses capsaicin.   PAST HISTORY:  1.  Hypertension.  2.  Elevated cholesterol.  3.  Transient ischemic attack with negative diagnostic evaluation, she      reports, including cardiac echocardiogram, seen by Dr. Erling Cruz.  4.  Some allergy or seasonal nasal congestion.  5.  Chronic headaches.  6.  Obstructive sleep apnea, with an AHI of 6 per hour (very mild), now      using CPAP at 8 CWP through Dr. Pennie Banter office.  7.  Bell's palsy affecting right cheek, present for 5 years.  8.  Degenerative disk disease, involving neck and lumbar spine, with lumbar      laminectomy L4-5 in 1999.  9.  Hysterectomy.  10. Appendectomy.  11. Fibromyalgia.  12. Two C-sections.  13. Two shoulder surgeries.  14. Sprained joints.  15. Restless leg syndrome.   MEDICATIONS:  1.  Multivitamins.  2.  Prevacid 30 mg.  3.  Levothroid 100 mcg.  4.  Bisoprolol 10/6.25 mg.  5.  Allegra D 12 hours.  6.  Cymbalta 60 mg.  7.  Nabumetone 500 mg.  8.  Lyrica 150 mg t.i.d.  9.  Tramadol 50 mg.  10. Simvastatin 40 mg.  11. Requip 1 mg.  12. Aspirin 81 mg.  13. CPAP 8 CWP.   DRUG INTOLERANCES:  SULFA and ERYTHROMYCIN.   SOCIAL:  Nonsmoker, rare alcohol.  Married with children.   FAMILY:  Blood clots and cardiac congestion, leukemia, allergies.   OBJECTIVE:  VITAL SIGNS:  Weight 198 pounds, BP 114/62 sitting left arm,  pulse regular 72, room air saturation 98%.  GENERAL:  She is an overweight  white female, alert, oriented and articulate.  Mild facial asymmetry and expression.  HEENT:  Gaze conjugate.  Oropharynx is clear.  Nasal airway unobstructed.  NECK:  No thyromegaly, neck vein distention or stridor.  CHEST:  Breathing unlabored without cough or wheeze.  Heart sounds regular.  EXTREMITIES:  Without restlessness, tremor, cyanosis, clubbing or edema.   IMPRESSION:  1.  Minimal obstructive sleep apnea, apnea/hypopnea index 6 per hour, on      continuous positive airway pressure 8 CWP.  2.  Sleep disordered movements, which may include both restless legs and      rapid eye movement behavior disorder, although she appears to have rapid      eye movement suppression, perhaps by her Cymbalta.  3.  Paresthesias in the legs, especially on waking in the morning.  4.  Degenerative disk disease.  5.  History of transient ischemic attack.  6.  Some of her discomforts would also be consistent with side effects of      her medications, especially Requip.  We talked about overlap among her      various conditions and therapies.  I cannot tell that she does not have      a seizure disorder, given her history of transient ischemic attack, but      witnesses are not available to indicate how stereotyped her nighttime      physical movements are.  I have suggested that she consider either      increasing her Requip dose, since the lower dose had worked well, while      watching for increased side effects.  Alternatively, she could stop the      Requip entirely, and after a wash-out period of a week or so, perhaps      try Mirapex as an alternative.  Since she is very close to a return      office visit with Dr. Erling Cruz, I have suggested that she try increasing the      Requip and watching for side effects.  We had reviewed Requip side      effects in [facility].  She should have some sense of direction by  the     time she sees Dr. Erling Cruz.  On that basis, I have offered to see her again       p.r.n.  I have appreciated the chance to meet her.                                   Clinton D. Annamaria Boots, MD, FCCP, FACP   CDY/MedQ  DD:  01/21/2006  DT:  01/22/2006  Job #:  [id number]   cc:   Unk Pinto, MD  Alyson Locket. Love, MD

## 2010-11-15 NOTE — Discharge Summary (Signed)
[name], [name] NO.:  [id number]   MEDICAL RECORD NO.:  [id number]          PATIENT TYPE:  INP   LOCATION:  5035                         FACILITY:  [facility]   PHYSICIAN:  Estill Bamberg. Crystal [name], M.D. DATE OF BIRTH:  1951/03/18   DATE OF ADMISSION:  12/18/2008  DATE OF DISCHARGE:  12/20/2008                               DISCHARGE SUMMARY   ADMISSION DIAGNOSIS:  Painful left knee.   DISCHARGE DIAGNOSES:  1. Status post left total knee revision.  2. Acute blood loss anemia, status post surgery   PROCEDURE:  Left total knee revision.   HISTORY:  A 60 year old female complained of pain in left knee.  She had  a left  TKA done in 2007.  The patient has had pain ever since surgery.  Risks and benefits of new surgery were discussed with the patient and  the patient would like to proceed with the total knee revision.   ALLERGIES:  The patient has no known allergies.   ADMISSION MEDICATIONS:  Upon admission, the patient was taking:  1. Omeprazole 40 mg daily.  2. Levothyroxine 100 mcg daily.  3. Benazepril 20 mg in the morning.  4. Claritin-D in the morning.  5. Bisoprolol 10/6.25 daily.  6. Citalopram 40 mg daily.  7. Gabapentin 800 mg 3-4 times a day.  8. Fenofibrate 30 mg.  9. Ropinirole 4 mg.  10.Lidoderm patches 2-3 patches q.8-12 hours as needed.  11.Fluconazole nasal spray 50 mg 2 sprays per nostril a day.  12.Percocet 7.5/500 as needed.  13.Ultimate Woman vitamin twice a day.  14.Vitamin D3 five times a week.  15.Ultimate 10.  16.Probiotic daily.  17.Essential Enzymes 500 mg daily.  18.__________ daily.  19.Prilosec OTC daily.  20.MiraLax 17 daily.   HOSPITAL COURSE:  This is a 60 year old female admitted on December 18, 2008.  After appropriate workup laboratory studies were obtained  preoperatively as well as Ancef on-call to the operating room, she was  taken to OR where she underwent a left total knee revision.  She  tolerated the procedure well.   She was taken to PACU in good condition.  She was placed on IV pain medication as well as p.o. and Foley was  placed intraoperatively.   Postop day #1, vital signs stable.  The patient denied chest pain,  shortness breath, or calf pain.  The patient was started on Lovenox 30  mg subcu q.12 at 8 a.m.  Consults to PT, OT, and Care Management were  made.  The patient is weightbearing as tolerated.  CPM 0 to 90 degrees 6-  8 hours per day.  Incentive spirometry teaching was done.   Postop day #2, the patient continued to progress physical therapy.  Dressing was changed.  Marcaine pump and Hemovac were discontinued.  Foley was discontinued.  The patient was continued on p.o. meds.  The  patient was discharged home after Lovenox teaching.   LABORATORY STUDIES:  Upon admission to the hospital, the patient's white  blood cell count was 11.3, H and H were 13.0 and 4.2, and platelets were  333.  Sodium was 135,  potassium 3.9, chloride was 96, CO2 was 30,  glucose was 95, BUN was 16, and creatinine was 0.9.  Upon discharge from  the hospital, the patient's white blood cell count was 6.8, H and H were  8.8 and 26.4, and platelets were 231.  Sodium was 136, potassium was  3.9, chloride was 101, CO2 was 32, glucose was 128, BUN was 12, and  creatinine was 0.97.   DISCHARGE MEDICATIONS:  Upon discharge from the hospital, the patient  was given prescriptions for:  1. Oxycodone 5 mg 1-2 tablets every 4-6 hours as needed for pain.  2. Robaxin 500 mg 1-2 tablets every 6-8 hours as need for spasm.  3. Lovenox 40 mg inject subcutaneously daily, last dose being January 01, 2009.   DISCHARGE INSTRUCTIONS:  There are no restrictions to diet.  Follow blue  instruction sheet for wound care.  Increase activity slowly.  May use a  cane or walker, weightbearing as tolerated.  No lifting or driving for 6  weeks.  Home health care per [facility].   The patient will follow with Dr. Ronnie [name] on July 6.  Call for an   appointment [phone].  Discharged in improved condition.     ______________________________  Crystal Spry, PA-C    ______________________________  Estill Bamberg. Crystal [name], M.D.    MJ/MEDQ  D:  01/26/2009  T:  01/27/2009  Job:  [id number]

## 2010-11-15 NOTE — Discharge Summary (Signed)
NAMEDAVEDA, LAROCK NO.:  [id number]   MEDICAL RECORD NO.:  [id number]          PATIENT TYPE:  INP   LOCATION:  5014                         FACILITY:  [facility]   PHYSICIAN:  Lockie Pares, M.D.    DATE OF BIRTH:  May 01, 1951   DATE OF ADMISSION:  03/13/2006  DATE OF DISCHARGE:  03/18/2006                               DISCHARGE SUMMARY   ADMISSION DIAGNOSIS:  Osteoarthritis left knee.   DISCHARGE DIAGNOSES:  1. Osteoarthritis left knee.  2. Hypothyroidism.  3. History of Bell's palsy and trigeminal neuralgia.  4. Gastroesophageal reflux disease (GERD).  5. Hypertension.  6. Hyperlipidemia.  7. History of interstitial cystitis.  8. Fibromyalgia.  9. Restless leg syndrome.  10.History of transient ischemic attack.  11.Sleep apnea, on CPAP.  12.Degenerative joint disease and osteoarthritis of the lumbar spine.  13.Post op hemorrhagic anemia.   PROCEDURE:  Left total knee arthroplasty, surgeon Caffrey, assistant  Petrarca PA-C, using LCS standard femur, 15 mm bearing with size 2.5  tibia and patella.   HISTORY:  Mrs. Sida is a 60 year old white female with left knee pain  since a sledding accident in January 1996.  At that time, she had an MCL  tear and medial meniscal tear.  They were treated conservatively.  Patient did well until February 2007 when she developed left knee pain.  She denies any mechanical symptoms.  Knee pain is constant pain that  mostly affects the medial aspect of the knee.  She does have waking pain  also.  She uses a cane to ambulate.  She has failed conservative  treatment which has included Orthovisc and cortisone injections.  X-rays  reveal end-stage left knee osteoarthritis.  Since she has failed  conservative treatment, she is now indicated for total knee replacement.   HOSPITAL COURSE:  60 year old female admitted March 13, 2006 after  appropriate laboratory studies were obtained, as well as 1 g Ancef IV on  call to the  operating room.  Was taken to the operating room where she  underwent a left total knee arthroplasty.  She tolerated the procedure  well.  She was continued on Ancef 1 g IV q.8 hours x3 more doses.  She  was placed on Lovenox 30 mg sub cu q.12 hours starting on March 14, 2006 at 8:00 a.m.  A Foley was placed intra operatively.  Consultations  with PT, OT, and care management were made.  Physical therapy for weight  bearing as tolerated.  Radiographic studies were obtained in the PACU of  the left knee.  She was placed on a Dilaudid full dose PCA pump.  CPAP  was used as per protocol.   She was allowed out of bed to chair the following day.  Her PCA was  discontinued.  Ziac was held for mild hypotension.  Drains were pulled  on the 16th and her IV was saline locked, 17th, she began teaching for  Lovenox.  She was typed and crossed for 1 unit of blood on the 18th and  this was given with 20 mg of Lasix IV after the blood cells.  The  remainder of her hospital course was uneventful.  She was taught step  training and once this occurred, she was discharged on the 19th, to  return back to the office in followup.  EKG was read as normal sinus  rhythm, normal ECG.  Radiographic studies portable left knee revealed  total knee replacement.   LABORATORY STUDIES:  Admitted with hemoglobin 13.9, hematocrit 40.4%,  white count 6900, platelets 295,000.  Discharge hemoglobin 11.0,  hematocrit 31.9%.  Pre op Pro Time 13.7, INR 1.0, PTT 33.  Pre op sodium  140, potassium 3.6, chloride 100, CO2 31, glucose 78, BUN 8, creatinine  0.9, calcium 9.3, total protein 6.4, albumin 3.8, AST 27, ALT 28, ALP  73, total bilirubin 0.6.  Discharge sodium 139, potassium 3.8, chloride  103, CO2 29, glucose 131, BUN 4, creatinine 0.8, calcium 8.1.  Urinalysis showed a __________ urine.  Blood type was A positive,  antibody screen negative.  Transfused 1 unit.  Urine culture showed  40,000 colonies of multiple  bacteria.   DISCHARGE INSTRUCTIONS:  There is no restriction in her diet.  No  driving or lifting for 6 weeks.  Ambulate weight bearing as tolerated  with the crutches.   DISCHARGE MEDICATIONS:  1. Lovenox 40 mg inject daily at 8:00 a.m. as taught.  2. Dilaudid 2 mg 1 tablet every 4 hours to 6 hours p.r.n. pain.  3. Robaxin 500 mg 1 tab every 6-8 hours for spasms.  4. No aspirin while on the Lovenox.  Resume aspirin once she is      finished with the Lovenox.  5. Hold Tramadol while on the Dilaudid.   [facility] for her PT and OT.  CPM 0-90 degrees increasing daily by 5  degrees.   FOLLOWUP:  Follow instruction sheet.  Followup with Dr. French Ana  in 2  weeks post op.   DISPOSITION:  Discharged in improved condition.      Mike Craze [place], P.A.-C.      Lockie Pares, M.D.  Electronically Signed    BDP/MEDQ  D:  05/09/2006  T:  05/10/2006  Job:  [id number]

## 2010-11-15 NOTE — Op Note (Signed)
[place]  Patient:    Crystal [name], Crystal [name]                    MRN: [id number] Proc. Date: 01/26/01 Adm. Date:  [id number] Attending:  Clare Charon CC:         Leonides Sake. Lucia Gaskins, M.D.   Operative Report  PREOPERATIVE DIAGNOSIS:  Chronic left lateral tongue ulcer.  POSTOPERATIVE DIAGNOSIS:  Chronic left lateral tongue ulcer.  OPERATION PERFORMED:  Excisional biopsy of left lateral tongue ulcer (1.5 cm).  SURGEON:  Leonides Sake. Lucia Gaskins, M.D.  ANESTHESIA:  Local 1% Xylocaine with 1:100,000 epinephrine.  INDICATIONS FOR PROCEDURE:  The patient is a 60 year old female who has a history of a right Bells palsy, has had a chronic ulcer on the side of her tongue, now for about five months.  She has had previous biopsy which was benign but because this has been persistent and causing a lot of pain, she is taken to the operating room at this time for excision of left lateral tongue ulcer.  DESCRIPTION OF PROCEDURE:  The left lateral tongue was injected with 2 cc of Xylocaine with epinephrine.  The ulcer was then elliptically excised.  there was some surrounding induration.  There was minimal bleeding.  Ulcer was sent to pathology and the defect was closed with three interrupted chromic sutures. The patient tolerated the procedure well and was subsequently discharged home.  DISPOSITION:  The patient is discharged home later this morning on Tylenol and Vicodin for pain.  Will have her follow up in my office in two weeks for recheck. DD:  01/26/01 TD:  01/26/01 Job: [id number]

## 2010-11-15 NOTE — Discharge Summary (Signed)
Crystal [name], Crystal [name] NO.:  [id number]   MEDICAL RECORD NO.:  [id number]          PATIENT TYPE:  INP   LOCATION:  5006                         FACILITY:  [facility]   PHYSICIAN:  Lockie Pares, M.D.    DATE OF BIRTH:  10-19-1950   DATE OF ADMISSION:  03/19/2007  DATE OF DISCHARGE:  03/22/2007                               DISCHARGE SUMMARY   ADMITTING DIAGNOSES:  1. Osteoarthritis right knee.  2. Hypothyroidism.  3. History of left total knee.  4. History of Bell's palsy.  5. Gastroesophageal reflux disease.  6. Hypertension.  7. Fibromyalgia.  8. Sleep apnea.  9. History of transient ischemic attack.  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  11.Restless leg syndrome.  12.Sinusitis.   DISCHARGE DIAGNOSES:  1. Status post right total knee arthroplasty.  2. Hypotension, resolved.  3. Acute blood loss anemia secondary to surgery, did not require blood      transfusion.  4. History of left total knee arthroplasty.  5. Hypothyroidism.  6. Gastroesophageal reflux disease.  7. History of Bell's palsy.  8. Fibromyalgia.  9. Sleep apnea.  10.History of transient ischemic attack.  11.Degenerative changes lumbar and cervical spine.  12.Restless leg syndrome.  13.Sinusitis.   HISTORY OF PRESENT ILLNESS:  Mrs. Weich is a 60 year old female with a  five-year history of right knee pain.  Pain worse over the last year.  No injury to the right knee.  History of left total knee September of  2007 doing well.  Right knee pain described as constant with radiation  to the right lower leg.  Pain worse with ambulation and prolonged  sitting.  She does use a cane to ambulate.  Has waking pain.  Mechanical  symptoms positive giving way.  X-rays:  X-rays of the right knee show  bone-on-bone medial compartment, tricompartmental changes.  Patient  admitted to undergo right total knee arthroplasty.   SURGICAL PROCEDURE:  Patient was taken to the operating  room on  March 19, 2007 by Dr. Earlie Server assisted by Zenaida Deed, PA-C.  Patient underwent general anesthesia and femoral nerve block and then  underwent a right total knee arthroplasty.  The following components  were used:  A standard right femoral component, a 2.5 tibial tray, a 15  mm insert and a metal-backed standard patellar.  Patient tolerated  procedure well and returned to recovery in good stable condition.   CONSULTS:  The following consults were obtained while patient was  hospitalized:  1. PT  2. [place]:  Postop day one, patient afebrile, vital signs stable.  Patient complained of right shoulder pain greater than right knee.  The  patient afebrile, hypotensive 89/60, otherwise vital signs stable.  H&H  11 and 32.3.   Postop day two, patient denied any chest pain, shortness of breath, no  nausea or vomiting, tolerating diet well and voiding well.  Patient was  afebrile, vital signs otherwise stable, hypotension resolved.  H&H 9.8  and 28.8.   Postop day three, patient complained of no bowel movement, otherwise  denied chest pain, shortness of  breath, nausea, vomiting, tolerating  diet, pain under control.  The patient was afebrile, vital signs stable.  H&H 10.5 and 31.3.  Patient did work with physical therapy and did well  that day and; therefore, was discharged later that day in good stable  condition.  For her constipation, she was given Dulcolax suppository.   LABS:  Routine labs on admission:  CBC:  All values within normal  limits.   Coags:  All values within normal limits.   Routine chemistries on admission:  All values within normal limits.   Hepatic enzymes:  All values within normal limits.   Urinalysis was negative.  Urine culture showed 10,000 colonies, multiple  bacterial species, no predominant species.   DISCHARGE MEDICATIONS:  1. Estradiol 0.1 mg patch.  2. Nabumetone 500 mg is to be discontinued.  3. Claritin-D one  daily.  4. Continue Tramadol HCL 50 mg four times daily to be stopped.  5. Requip 4 mg at bedtime, continue.  6. Benazepril 20 mg one daily, continue.  7. Bisoprolol 10/6.25 one daily, continue.  8. Gabapentin 800 mg four tabs daily, continue.  9. Tricor 140 mg one daily, continue.  10.Citalopram 40 mg one daily, continue.  11.Ranitidine 300 mg two daily, continue.  12.Azithromycin 250 mg as directed, continue.  13.Levothyroxine 100 mcg one daily, continue.  14.Compazine 5 mg one p.o. three times daily, continue.  15.Meclizine 25 mg t.i.d. as needed.  16.Lorazepam 2 mg one p.o. q.h.s. p.r.n.  17.Fluticasone nasal spray b.i.d., continue.   The following meds __________  1. Lovenox 40 mg one injection daily at 8:00 a.m. x11 days.  2. Dilaudid 2 mg one tablet q.4 hours p.r.n. pain.  3. Robaxin 500 mg one or two tabs q 6-8 hours for spasm.  4. Iron 325 mg one tab daily x30 daily.  5. Laxative stool softener as needed.   WOUND CARE:  Patient to keep wound clean, dry and change dressing daily.  May shower after two days if no drainage.  Call if any signs of  infection.   DIET:  No restrictions.   SPECIAL INSTRUCTIONS:  CPM zero to 90 degrees six to eight hours a day,  increase by 10 degrees daily.   FOLLOWUP:  Patient needs to followup with Dr. French Ana in the office 14  days from surgery.  Patient is to call office at [phone] for  appointment.   CONDITION ON DISCHARGE:  Patient was discharged to home in good stable  condition.      Erskine Emery, Alisa Graff, M.D.  Electronically Signed    GC/MEDQ  D:  04/16/2007  T:  04/17/2007  Job:  [id number]

## 2010-11-15 NOTE — Assessment & Plan Note (Signed)
[facility]                              CARDIOLOGY OFFICE NOTE   Crystal [name], Crystal [name]                    MRN:          [id number]  DATE:02/12/2006                            DOB:          May 02, 1951    CARDIOLOGY CONSULTATION.   REQUESTING PHYSICIAN:  Unk Pinto, MD.   REASON FOR CONSULTATION:  Preoperative evaluation.   HISTORY OF PRESENT ILLNESS:  Crystal [name] is a 60 year old woman with a  reported history of hypertension, hyperlipidemia, obstructive sleep apnea on  CPAP therapy, and previous transient ischemic attack back in 2005.  She has  additional history of Bell's palsy and previous surgeries as outlined below.  She denies any known history of coronary artery disease or myocardial  infarction.  She had an echocardiogram obtained through our practice back in  2005, around the time of her transient ischemic attack, which revealed  normal left ventricular function with mild aortic sclerosis, trace mitral  regurgitation and no other major valvular abnormalities.  She is referred  now for preoperative assessment prior to a left total knee arthroplasty.  The actual surgical referral form indicates bilateral rotator cuff tear,  although the patient tells me that this was treated surgically in the past  and that her actual consultation now is for her knee surgery.   In terms of symptoms, Crystal [name] ambulates with a cane and is able to  traverse 12 steps without stopping, although not in quick pace.  She does  not have any clear exertional chest pain.  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  She denies having any prior ischemic evaluation and also has a  family history significant for cardiovascular disease including a heart  attack in her uncle at age 37.  Her electrocardiogram today shows sinus  rhythm at 82 b.p.m.  I do not have an old tracing at hand for  comparison.  We have been asked to evaluate her and assist with additional risk  stratification.   ALLERGIES:  SULFA DRUGS AND ERYTHROMYCIN.   PRESENT MEDICATIONS:  1. Multivitamin.  2. Prevacid 30 mg p.o. daily.  3. Levothyroxine 100 mcg p.o. daily.  4. Bisoprolol 10/6.25 mg p.o. daily.  5. Allegra D as directed.  6. Cymbalta 60 mg p.o. daily.  7. Nabumetone 500 mg as directed.  8. Lyrica 150 mg p.o. t.i.d.  9. Tramadol 50 mg 1-2 tablets p.o. p.r.n.  10.Requip 1 mg as directed.  11.Aspirin 81 mg p.o. daily.  12.Zetia 10 mg p.o. daily.  13.Omega 3 fish oil supplements.   PAST MEDICAL HISTORY:  As outlined in the History of Present Illness.  The  patient states that she has had several prior surgeries including  tonsillectomy, adenoidectomy, appendectomy, reset broken arm, cesarean  sections, hysterectomy, spinal surgery of L4 L5 in 1998, and more recently  bilateral shoulder surgery due to rotator cuff tears in 2004 and 2006.   REVIEW OF SYSTEMS:  Significant for seasonal allergies, fatigue, significant  left knee pain and occasional reflux.   SOCIAL HISTORY:  The patient is married and has 2 children.  She has no  significant tobacco or alcohol use.  She is retired, previously worked as a  Lawyer.  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   FAMILY HISTORY:  Is as discussed above including a history of premature  cardiovascular disease.   EXAMINATION:  Blood pressure is 122/74, rate is 82, weight is 198 pounds.  GENERAL:  This is an overweight woman in no acute distress, denying any  active chest pain.  HEENT:  Conjunctiva and lids are normal, oropharynx is clear.  NECK:  Supple without elevated jugular venous pressure, there is no loud  bruits, no thyromegaly is noted.  LUNGS:  Clear without labored breathing.  CARDIAC EXAM:  Reveals a regular rate and rhythm without loud murmur or S3  gallop.  There is no pericardial rub evident.   ABDOMEN:  Soft with normal bowel sounds, no obvious bruits.  EXTREMITIES:  Exhibit no significant pitting edema, distal pulses are 1-2+.   IMPRESSION RECOMMENDATIONS:  1. Preoperative evaluation in a 60 year old woman with cardiac risk      factors including hypertension, hyperlipidemia, previous transient      ischemic attack in 2005 and family history of premature cardiovascular      disease.  Her resting electrocardiogram is normal.  She is functionally      limited by her knee pain.  To achieve more accurate risk      stratification, our plan will be to proceed with an adenosine Myoview      on medical therapy.  If this is low risk, I would anticipate that she      would be able to proceed with planned knee replacement.  She is      already on a beta-blocker, which should obviously be continued      throughout.  2. Further plans to follow.                                Satira Sark, MD    SGM/MedQ  DD:  02/12/2006  DT:  02/12/2006  Job #:  [id number]   cc:   Unk Pinto, MD  Lockie Pares, MD

## 2010-11-15 NOTE — H&P (Signed)
NAMEEVELEEN, MCNEAR NO.:  [id number]   MEDICAL RECORD NO.:  [id number]          PATIENT TYPE:  INP   LOCATION:  NA                           FACILITY:  [facility]   PHYSICIAN:  Lockie Pares, M.D.    DATE OF BIRTH:  1950-09-28   DATE OF ADMISSION:  DATE OF DISCHARGE:                                HISTORY & PHYSICAL   DATE OF ADMISSION:  March 13, 2006   CHIEF COMPLAINT:  Left knee OA.   HISTORY OF PRESENT ILLNESS:  Mrs. Kester is a 60 year old white female with  left knee pain since a sledding accident January 1996.  At that time she had  an MCL tear and a medial meniscal tear.  These were treated conservatively.  The patient did well until February 2007 when she developed left knee pain.  She denies any mechanical symptoms.  Knee pain is a constant pain that  mostly affects the medial aspect of the knee.  She does have waking pain.  She uses a cane to ambulate.  She has failed conservative treatment which  has included Orthovisc and cortisone injections.  X-rays of the left knee  show end-stage osteoarthritis.   ALLERGIES:  1. CODEINE causes headache.  2. PERCOCET - hallucinations.  3. VICODIN - itching.  4. MORPHINE - nausea and dizziness.  5. SULFA - she is unsure.  6. ERYTHROMYCIN causes her to break out in hives.  7. FLAX SEED OIL - runny nose and itchy eyes.   CURRENT MEDICATIONS:  1. Prevacid 30 mg one daily in the a.m.  2. Levothroid 100 mcg one daily.  3. Bisoprolol 10/625 one daily.  4. Allegra-D 12 Hour one tablet q.a.m.  5. Cymbalta 60 mg one q.a.m.  6. Nabumetone 500 mg one b.i.d.  7. Lyrica 150 mg one t.i.d.  8. Tramadol 50 mg one to two tablets as needed for pain.  9. Zetia 10 mg one p.o. q.h.s.  10.ReQuip 2 mg one tablet p.o. q.h.s.  11.Aspirin 81 mg once daily, stopped 1 month ago.  12.Iron one tablet daily.  13.Omega 3 two to three tablets daily.  14.Vitamin D 400 international units one daily.  15.Malic acid and magnesium  tablet 5/86 mg two tablets three times a day.  16.Coenzyme Q one tablet two to three times a day.  17.Manganese chelate 16.7 mg three tablets at dinner.  18.Glucosamine sulfate 1500 mg two tablets daily.  19.Red yeast rice 600 mg two tablets daily.  20.Chelated iron 25 mg two tablets daily.   PAST MEDICAL HISTORY:  1. Hypothyroidism.  2. History of Bell's palsy and trigeminal neuralgia.  3. History of GERD.  4. History of hypertension.  5. Hyperlipidemia.  6. History of interstitial cystitis.  7. History of fibromyalgia.  8. Restless legs syndrome.  9. History of TIA.  10.Sleep apnea, on CPAP.  11.Cervical degenerative disc disease, lumbar degenerative disc disease.   PAST SURGICAL HISTORY:  1. In the 1960s, tonsillectomy, adenoidectomy.  2. In the 1960s, appendectomy.  3. In 1983, C-section.  4. In 1986, C-section.  5. In 1998, hysterectomy.  6. In 1998,  lumbar fusion L4-L5.  7. In 2005, left shoulder cope with rotator cuff repair.  8. In 2006, right shoulder rotator cuff repair.   The patient denies any complications with the above procedures and has  received no blood transfusions in the past.  She does note an ileus  postoperatively the hysterectomy.   SOCIAL HISTORY:  The patient denies any tobacco use.  She drinks  occasionally.  She lives in a three-story home with her husband.   PCP is Dr. Melford Aase.   FAMILY HISTORY:  The patient's mother alive at age 71, has CHF, diabetes,  pacemaker, and history of cancer.  Father, age 15, has coronary artery  disease and history of CABG with stent placement.  Also has history of MI  and hypertension.  She had a 77 year old sister who died of leukemia and had  a history of lupus.  She has a 83 year old sister who has a history of  gastric cancer and fibromyalgia.   REVIEW OF SYSTEMS:  The patient wears glasses at all times.  She had a  remote history of bronchitis.  History of Bell's palsy 5 years ago with mild  facial droop  on the right.  History of TIA 2 years ago.  Hypertension.  She  has a history of nausea, vomiting, diarrhea, constipation, and a history of  ileus postoperatively hysterectomy.  As a child she had scarlet fever.  She  has hypothyroidism.  Suffers from dizziness secondary to orthostatic  hypotension.  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  Positive for sleep  apnea for which she uses CPAP.  She has weight gain, restless legs syndrome,  and degenerative disc disease of the lumbar spine and the cervical spine.   PHYSICAL EXAMINATION:  GENERAL:  The patient is a well-developed, well-  nourished, overweight female who walks with an antalgic gait and uses a cane  to ambulate.  The patient's mood and affect are appropriate.  She talks  easily with the examiner.  The patient's height 5 feet 5 inches, weight is  185 pounds.  VITAL SIGNS:  Temperature 98.6, pulse 72, respiratory rate 16, blood  pressure is 112/72.  CARDIAC:  Regular rate and rhythm; no murmurs, rubs, gallops noted.  LUNGS:  Clear to auscultation bilaterally with no wheezes, rhonchi, or rales  noted.  HEENT:  Head is normocephalic, atraumatic, without frontal sinus tenderness.  She does have some maxillary tenderness on the left to palpation.  Conjunctivae are pink, sclerae are nonicteric.  PERRLA.  EOMs are intact.  No visible external ear deformities.  TMs are pearly gray bilaterally.  Nose:  Nasal septum midline, nasal mucosa pink and moist without polyps.  Buccal mucosa pink and moist.  The patient has good dentition.  Throat is  without erythema or exudate.  Tongue and uvula are in the midline.  ABDOMEN:  The patient has some mild epigastric tenderness to right upper  quadrant and hypogastric tenderness with palpation.  Otherwise, the abdomen  is soft and has bowel sounds x4 quadrants.  NECK:  Trachea is midline.  No lymphadenopathy.  Carotids 2+ without bruits. No tenderness along the upper cervical spine.  Lower cervical  spine she does  have some tenderness with palpation.  She has full range of motion of the  cervical spine without pain.  BACK:  No tenderness with palpation of the thoracic spine.  The lower lumbar  spine, she does have tenderness.  BREAST, GENITOURINARY AND RECTAL:  All deferred at this time.  MUSCULOSKELETAL:  Upper extremities:  She has full range of motion of the  upper extremities including the shoulders, elbows, and wrists.  Bilateral  hands:  She has arthritic changes and has tenderness in the right hand in  the first dorsal compartment.  Otherwise, good sensation throughout the  fingertips.  Lower extremities:  She has full range of motion of both hips  without pain.  Left knee 0-116 degrees of flexion.  She has tenderness along  the medial joint line with palpation.  No effusion, no edema noted in the  knee.  Crepitus noted with passive range of motion.  Valgus-varus stressing  reveals no laxity.  Right knee 0-120 degrees of flexion.  She has tenderness  with palpation along the medial joint line.  Valgus-varus stressing reveals  no laxity.  No effusion, no edema is noted.  Calves are nontender  bilaterally.  She has bunions both feet, left greater than right.  Dorsal  pedal pulses are 2+ bilaterally and equal and symmetric.  NEUROLOGIC:  The patient is alert and oriented x3.  Cranial nerves II-XII  are grossly intact except for cranial nerve VII which she has the inability  to smile or raise her eyelids on the right secondary to Bell's palsy.  Lower  extremity strength testing reveals 5/5 strength without any evidence of  weakness.   IMPRESSION:  1. End-stage osteoarthritis left knee.  2. Hypothyroidism.  3. History of Bell's palsy and trigeminal neuralgia.  The patient with      some residual effects involving cranial nerve VII and the right side of      the fact.  4. Gastroesophageal reflux disease.  5. Hypertension.  6. Hyperlipidemia.  7. History of interstitial  cystitis.  8. Fibromyalgia.  9. Restless legs syndrome.  10.History of transient ischemic attack.  11.Sleep apnea on constant positive airway pressure.  12.Degenerative disc disease lumbar spine and cervical spine.   PLAN:  The patient is to be admitted to [facility] on March 13, 2006, to undergo a left total knee arthroplasty by Dr. French Ana.  Prior to  surgery the patient to undergo all preoperative labs and testing.  The  patient did receive clearance from her primary care physician, Dr. Melford Aase.      Erskine Emery, Alisa Graff, M.D.  Electronically Signed    GC/MEDQ  D:  03/10/2006  T:  03/10/2006  Job:  [id number]

## 2010-11-15 NOTE — Discharge Summary (Signed)
Crystal [name], Crystal [name] NO.:  [id number]   MEDICAL RECORD NO.:  [id number]          PATIENT TYPE:  INP   LOCATION:  3025                         FACILITY:  [facility]   PHYSICIAN:  Marchia Meiers. Vertell Limber, M.D.  DATE OF BIRTH:  1950/08/17   DATE OF ADMISSION:  12/23/2007  DATE OF DISCHARGE:  12/27/2007                               DISCHARGE SUMMARY   REASON FOR ADMISSION:  Lumbar disk displacement with lumbosacral  spondylosis, spondylolisthesis, lumbar disk degeneration, hypertension,  hypercholesterolemia, hypothyroidism, myalgia, myositis, osteoarthritis,  restless leg syndrome, personal history of TIA, and status post prior  lumbar arthrodesis.   FINAL DIAGNOSES:  Lumbar disk displacement with lumbosacral spondylosis,  spondylolisthesis, lumbar disk degeneration, hypertension,  hypercholesterolemia, hypothyroidism, myalgia, myositis, osteoarthritis,  restless leg syndrome, personal history of TIA, and status post prior  lumbar arthrodesis.   HISTORY OF ILLNESS AND HOSPITAL COURSE:  [name] is a 60-year-  old woman who had previously undergone L5 through S1 decompression and  fusion, has not developed a significant spondylolisthesis and  degeneration with herniated disk at the L4-L5 level and with severe back  pain and radiculopathy.  The patient was admitted to the hospital and  underwent uncomplicated exploration of fusion, diskectomy at L4-L5 with  pedicle screw fixation at the L4 through L5 levels.  Postoperatively,  she was on a fentanyl PCA.  She was gradually mobilized.  She worked  with physical therapy.  Foley catheter was discontinued on December 25, 2007, and she was doing well on December 27, 2007, was discharged home with  OxyContin 20 mg twice daily, OxyIR 5 mg every four hours as needed, and  Robaxin 750 mg every six as needed for muscle spasms with home health,  physical therapy, occupational therapy 3 in 1 and a rolling walker with  instructions to  follow up in the office in 3 weeks postoperatively.      Marchia Meiers. Vertell Limber, M.D.  Electronically Signed     JDS/MEDQ  D:  02/09/2008  T:  02/09/2008  Job:  [id number]

## 2011-03-25 LAB — CREATININE, SERUM
Creatinine, Ser: 0.91
GFR calc Af Amer: >60
GFR calc non Af Amer: >60

## 2011-03-26 LAB — BASIC METABOLIC PANEL
BUN: 13
CO2: 29
Calcium: 9.4
Chloride: 100
Creatinine, Ser: 1.12
GFR calc Af Amer: >60
GFR calc non Af Amer: 50 — ABNORMAL LOW
Glucose, Bld: 88
Potassium: 4.1
Sodium: 137

## 2011-03-26 LAB — CBC
HCT: 38
Hemoglobin: 12.8
MCHC: 33.7
MCV: 89
Platelets: 329
RBC: 4.27
RDW: 14
WBC: 10.2

## 2011-03-26 LAB — TYPE AND SCREEN
ABO/RH(D): A NEG
Antibody Screen: NEGATIVE

## 2011-03-27 LAB — BASIC METABOLIC PANEL
BUN: 11
BUN: 8
CO2: 28
CO2: 30
Calcium: 8.4
Calcium: 9.3
Chloride: 100
Chloride: 108
Creatinine, Ser: 0.89
Creatinine, Ser: 1.07
GFR calc Af Amer: >60
GFR calc Af Amer: >60
GFR calc non Af Amer: 52 — ABNORMAL LOW
GFR calc non Af Amer: >60
Glucose, Bld: 104 — ABNORMAL HIGH
Glucose, Bld: 111 — ABNORMAL HIGH
Potassium: 3.9
Potassium: 3.9
Sodium: 137
Sodium: 143

## 2011-03-27 LAB — CBC
HCT: 29.2 — ABNORMAL LOW
HCT: 40.4
Hemoglobin: 13.9
Hemoglobin: 9.9 — ABNORMAL LOW
MCHC: 34
MCHC: 34.4
MCV: 88.1
MCV: 88.6
Platelets: 261
Platelets: 322
RBC: 3.3 — ABNORMAL LOW
RBC: 4.58
RDW: 14.2
RDW: 14.2
WBC: 6.1
WBC: 9.9

## 2011-03-27 LAB — TYPE AND SCREEN
ABO/RH(D): A NEG
Antibody Screen: NEGATIVE

## 2011-04-10 LAB — CBC
HCT: 28.8 — ABNORMAL LOW
HCT: 31.3 — ABNORMAL LOW
HCT: 32.3 — ABNORMAL LOW
HCT: 42.2
Hemoglobin: 10.5 — ABNORMAL LOW
Hemoglobin: 11 — ABNORMAL LOW
Hemoglobin: 14.3
Hemoglobin: 9.8 — ABNORMAL LOW
MCHC: 33.7
MCHC: 33.9
MCHC: 33.9
MCHC: 34
MCV: 94.1
MCV: 94.2
MCV: 94.3
MCV: 95.5
Platelets: 229
Platelets: 257
Platelets: 274
Platelets: 334
RBC: 3.06 — ABNORMAL LOW
RBC: 3.28 — ABNORMAL LOW
RBC: 3.43 — ABNORMAL LOW
RBC: 4.49
RDW: 12.5
RDW: 12.9
RDW: 12.9
RDW: 13
WBC: 5.8
WBC: 7.2
WBC: 8
WBC: 8.7

## 2011-04-10 LAB — COMPREHENSIVE METABOLIC PANEL
ALT: 33
AST: 29
Albumin: 4.1
Alkaline Phosphatase: 39
BUN: 15
CO2: 30
Calcium: 9.6
Chloride: 98
Creatinine, Ser: 0.98
GFR calc Af Amer: >60
GFR calc non Af Amer: 58 — ABNORMAL LOW
Glucose, Bld: 94
Potassium: 4.1
Sodium: 137
Total Bilirubin: 0.7
Total Protein: 7.3

## 2011-04-10 LAB — BASIC METABOLIC PANEL
BUN: 15
BUN: 7
CO2: 31
CO2: 31
Calcium: 7.7 — ABNORMAL LOW
Calcium: 8.1 — ABNORMAL LOW
Chloride: 101
Chloride: 98
Creatinine, Ser: 0.96
Creatinine, Ser: 1.05
GFR calc Af Amer: >60
GFR calc Af Amer: >60
GFR calc non Af Amer: 54 — ABNORMAL LOW
GFR calc non Af Amer: 60 — ABNORMAL LOW
Glucose, Bld: 120 — ABNORMAL HIGH
Glucose, Bld: 121 — ABNORMAL HIGH
Potassium: 3.7
Potassium: 4.2
Sodium: 134 — ABNORMAL LOW
Sodium: 136

## 2011-04-10 LAB — DIFFERENTIAL
Basophils Absolute: 0.1
Basophils Relative: 1
Eosinophils Absolute: 0.1
Eosinophils Relative: 1
Lymphocytes Relative: 22
Lymphs Abs: 1.9
Monocytes Absolute: 0.7
Monocytes Relative: 8
Neutro Abs: 5.9
Neutrophils Relative %: 68

## 2011-04-10 LAB — URINALYSIS, ROUTINE W REFLEX MICROSCOPIC
Bilirubin Urine: NEGATIVE
Glucose, UA: NEGATIVE
Hgb urine dipstick: NEGATIVE
Ketones, ur: NEGATIVE
Nitrite: NEGATIVE
Protein, ur: NEGATIVE
Specific Gravity, Urine: 1.022
Urobilinogen, UA: 0.2
pH: 5.5

## 2011-04-10 LAB — CROSSMATCH
ABO/RH(D): A NEG
Antibody Screen: NEGATIVE

## 2011-04-10 LAB — URINE CULTURE: Colony Count: 10000

## 2011-04-10 LAB — PROTIME-INR
INR: 1
Prothrombin Time: 12.9

## 2011-04-10 LAB — APTT: aPTT: 31

## 2011-05-05 ENCOUNTER — Other Ambulatory Visit (HOSPITAL_COMMUNITY): Payer: Self-pay | Admitting: Neurosurgery

## 2011-05-05 DIAGNOSIS — M4310 Spondylolisthesis, site unspecified: Secondary | ICD-10-CM

## 2011-05-09 ENCOUNTER — Ambulatory Visit (HOSPITAL_COMMUNITY)
Admission: RE | Admit: 2011-05-09 | Discharge: 2011-05-09 | Disposition: A | Discharge status: Home / Self Care | Payer: Medicare Other | Source: Ambulatory Visit | Attending: Neurosurgery | Admitting: Neurosurgery

## 2011-05-09 DIAGNOSIS — M4310 Spondylolisthesis, site unspecified: Secondary | ICD-10-CM

## 2011-05-09 DIAGNOSIS — M545 Low back pain, unspecified: Secondary | ICD-10-CM | POA: Insufficient documentation

## 2011-05-09 DIAGNOSIS — M79609 Pain in unspecified limb: Secondary | ICD-10-CM | POA: Insufficient documentation

## 2011-05-09 DIAGNOSIS — Q762 Congenital spondylolisthesis: Secondary | ICD-10-CM | POA: Insufficient documentation

## 2011-05-09 DIAGNOSIS — M51379 Other intervertebral disc degeneration, lumbosacral region without mention of lumbar back pain or lower extremity pain: Secondary | ICD-10-CM | POA: Insufficient documentation

## 2011-05-09 DIAGNOSIS — M5137 Other intervertebral disc degeneration, lumbosacral region: Secondary | ICD-10-CM | POA: Insufficient documentation

## 2011-05-09 LAB — CREATININE, SERUM
Creatinine, Ser: 0.98 mg/dL (ref 0.50–1.10)
GFR calc Af Amer: 70 mL/min — ABNORMAL LOW (ref >90)
GFR calc non Af Amer: 61 mL/min — ABNORMAL LOW (ref >90)

## 2011-05-09 MED ORDER — GADOBENATE DIMEGLUMINE 529 MG/ML IV SOLN
18.0000 mL | Freq: Once | INTRAVENOUS | Status: AC | PRN
  Administered 2011-05-09: 18 mL via INTRAVENOUS

## 2011-11-19 DIAGNOSIS — M21611 Bunion of right foot: Secondary | ICD-10-CM | POA: Insufficient documentation

## 2012-01-13 ENCOUNTER — Other Ambulatory Visit (HOSPITAL_COMMUNITY): Payer: Self-pay | Admitting: Internal Medicine

## 2012-01-13 ENCOUNTER — Ambulatory Visit (HOSPITAL_COMMUNITY)
Admission: RE | Admit: 2012-01-13 | Discharge: 2012-01-13 | Disposition: A | Discharge status: Home / Self Care | Payer: Medicare Other | Source: Ambulatory Visit | Attending: Internal Medicine | Admitting: Internal Medicine

## 2012-01-13 DIAGNOSIS — R143 Flatulence: Secondary | ICD-10-CM | POA: Insufficient documentation

## 2012-01-13 DIAGNOSIS — R141 Gas pain: Secondary | ICD-10-CM | POA: Insufficient documentation

## 2012-01-13 DIAGNOSIS — R109 Unspecified abdominal pain: Secondary | ICD-10-CM

## 2012-01-13 DIAGNOSIS — K5090 Crohn's disease, unspecified, without complications: Secondary | ICD-10-CM | POA: Insufficient documentation

## 2012-01-13 DIAGNOSIS — R142 Eructation: Secondary | ICD-10-CM | POA: Insufficient documentation

## 2012-04-19 ENCOUNTER — Other Ambulatory Visit: Payer: Self-pay | Admitting: Internal Medicine

## 2012-04-19 DIAGNOSIS — R109 Unspecified abdominal pain: Secondary | ICD-10-CM

## 2012-04-21 ENCOUNTER — Other Ambulatory Visit: Payer: Medicare Other

## 2012-04-23 ENCOUNTER — Ambulatory Visit
Admission: RE | Admit: 2012-04-23 | Discharge: 2012-04-23 | Disposition: A | Discharge status: Home / Self Care | Payer: Medicare Other | Source: Ambulatory Visit | Attending: Internal Medicine | Admitting: Internal Medicine

## 2012-04-23 DIAGNOSIS — R109 Unspecified abdominal pain: Secondary | ICD-10-CM

## 2012-04-23 MED ORDER — IOHEXOL 300 MG/ML  SOLN
100.0000 mL | Freq: Once | INTRAMUSCULAR | Status: AC | PRN
  Administered 2012-04-23: 100 mL via INTRAVENOUS

## 2012-07-05 ENCOUNTER — Other Ambulatory Visit: Payer: Self-pay | Admitting: Neurology

## 2012-07-05 DIAGNOSIS — R269 Unspecified abnormalities of gait and mobility: Secondary | ICD-10-CM

## 2012-07-05 DIAGNOSIS — G459 Transient cerebral ischemic attack, unspecified: Secondary | ICD-10-CM

## 2012-07-09 ENCOUNTER — Ambulatory Visit
Admission: RE | Admit: 2012-07-09 | Discharge: 2012-07-09 | Disposition: A | Discharge status: Home / Self Care | Payer: Medicare Other | Source: Ambulatory Visit | Attending: Neurology | Admitting: Neurology

## 2012-07-09 DIAGNOSIS — R269 Unspecified abnormalities of gait and mobility: Secondary | ICD-10-CM

## 2012-07-09 DIAGNOSIS — G459 Transient cerebral ischemic attack, unspecified: Secondary | ICD-10-CM

## 2012-08-23 DIAGNOSIS — M75100 Unspecified rotator cuff tear or rupture of unspecified shoulder, not specified as traumatic: Secondary | ICD-10-CM | POA: Insufficient documentation

## 2012-09-07 DIAGNOSIS — M25569 Pain in unspecified knee: Secondary | ICD-10-CM | POA: Insufficient documentation

## 2012-09-15 ENCOUNTER — Other Ambulatory Visit: Payer: Self-pay | Admitting: Internal Medicine

## 2012-09-15 DIAGNOSIS — R599 Enlarged lymph nodes, unspecified: Secondary | ICD-10-CM

## 2012-09-15 DIAGNOSIS — N6310 Unspecified lump in the right breast, unspecified quadrant: Secondary | ICD-10-CM

## 2012-09-17 ENCOUNTER — Ambulatory Visit: Payer: Medicare Other

## 2012-09-21 ENCOUNTER — Telehealth: Payer: Self-pay | Admitting: Oncology

## 2012-09-21 ENCOUNTER — Telehealth: Payer: Self-pay | Admitting: Internal Medicine

## 2012-09-21 ENCOUNTER — Encounter (HOSPITAL_COMMUNITY): Payer: Medicare Other

## 2012-09-21 NOTE — Telephone Encounter (Signed)
LVOM FOR PT TO RETURN CALL IN RE NP APPT.

## 2012-09-21 NOTE — Telephone Encounter (Signed)
S/W PT IN RE TO NP APPT 04/23 @ 1:30 W/DR. [place], PA DX- SYSTEMATIC REFRACTORY ANEMIA/ ? IRON INFUSION.  WELCOME PACKET MAILED,

## 2012-09-22 ENCOUNTER — Telehealth: Payer: Self-pay | Admitting: Oncology

## 2012-09-22 NOTE — Telephone Encounter (Signed)
C/D 09/22/12 for appt. 10/20/12

## 2012-09-24 ENCOUNTER — Ambulatory Visit
Admission: RE | Admit: 2012-09-24 | Discharge: 2012-09-24 | Disposition: A | Discharge status: Home / Self Care | Payer: Medicare Other | Source: Ambulatory Visit | Attending: Internal Medicine | Admitting: Internal Medicine

## 2012-09-24 DIAGNOSIS — R599 Enlarged lymph nodes, unspecified: Secondary | ICD-10-CM

## 2012-09-24 DIAGNOSIS — N6310 Unspecified lump in the right breast, unspecified quadrant: Secondary | ICD-10-CM

## 2012-09-30 ENCOUNTER — Other Ambulatory Visit: Payer: Self-pay | Admitting: Neurology

## 2012-09-30 DIAGNOSIS — D509 Iron deficiency anemia, unspecified: Secondary | ICD-10-CM

## 2012-09-30 NOTE — Progress Notes (Signed)
I called patient. The patient has apparently had blood work done recently showing a low iron level of 12. In the past, Dr. love has given her IV iron dextran. The patient does have a history of restless leg syndrome. I will get an iron dextran infusion set up for her.

## 2012-10-06 ENCOUNTER — Other Ambulatory Visit: Payer: Self-pay | Admitting: Neurology

## 2012-10-06 ENCOUNTER — Telehealth: Payer: Self-pay | Admitting: Neurology

## 2012-10-06 NOTE — Telephone Encounter (Signed)
Pt returned my call and she stated she was not aware of appts.  She has been talking with several people in Dr. Jacelyn Pi office. LMVM for Dr. Melford Aase to call her.  She had labs drawn by Dr. Earlean Shawl (GI) yesterday and is awaiting results.   I told her that would proceed with Dr. Melford Aase since he started process.   If we can be of help to let us know.  She verbalized understanding. I relayed the cancelled and rescheduled appts for her at [facility].

## 2012-10-06 NOTE — Telephone Encounter (Signed)
I called and spoke to [facility] at [facility] and pt had been scheduled for IV fereheme thru Dr. Melford Aase.  (09-17-12 and 09-21-12).  She had cancelled and then rescheduled and then cancelled again.   I LMVM for pt on her home phone for her to call me back for I needed information.  I consulted with Dr. Jannifer Franklin and told him about the other appt for infusion and pt should proceed with Dr. Jacelyn Pi previous order.

## 2012-10-20 ENCOUNTER — Ambulatory Visit: Payer: Medicare Other

## 2012-10-20 ENCOUNTER — Other Ambulatory Visit: Payer: Self-pay | Admitting: Oncology

## 2012-10-20 ENCOUNTER — Ambulatory Visit (HOSPITAL_BASED_OUTPATIENT_CLINIC_OR_DEPARTMENT_OTHER): Payer: Medicare Other | Admitting: Lab

## 2012-10-20 ENCOUNTER — Ambulatory Visit: Payer: Medicare Other | Admitting: Oncology

## 2012-10-20 ENCOUNTER — Other Ambulatory Visit: Payer: Medicare Other | Admitting: Lab

## 2012-10-20 DIAGNOSIS — D649 Anemia, unspecified: Secondary | ICD-10-CM

## 2012-10-20 LAB — CBC WITH DIFFERENTIAL/PLATELET
BASO%: 1.1 % (ref 0.0–2.0)
Basophils Absolute: 0.1 10*3/uL (ref 0.0–0.1)
EOS%: 1.7 % (ref 0.0–7.0)
Eosinophils Absolute: 0.1 10*3/uL (ref 0.0–0.5)
HCT: 40.2 % (ref 34.8–46.6)
HGB: 12.9 g/dL (ref 11.6–15.9)
LYMPH%: 17.6 % (ref 14.0–49.7)
MCH: 26.4 pg (ref 25.1–34.0)
MCHC: 32.2 g/dL (ref 31.5–36.0)
MCV: 82.1 fL (ref 79.5–101.0)
MONO#: 0.6 10*3/uL (ref 0.1–0.9)
MONO%: 8.2 % (ref 0.0–14.0)
NEUT#: 5.3 10*3/uL (ref 1.5–6.5)
NEUT%: 71.4 % (ref 38.4–76.8)
Platelets: 265 10*3/uL (ref 145–400)
RBC: 4.9 10*6/uL (ref 3.70–5.45)
RDW: 16.9 % — ABNORMAL HIGH (ref 11.2–14.5)
WBC: 7.4 10*3/uL (ref 3.9–10.3)
lymph#: 1.3 10*3/uL (ref 0.9–3.3)

## 2012-10-20 LAB — IRON AND TIBC
%SAT: 16 % — ABNORMAL LOW (ref 20–55)
Iron: 64 ug/dL (ref 42–145)
TIBC: 407 ug/dL (ref 250–470)
UIBC: 343 ug/dL (ref 125–400)

## 2012-10-20 LAB — COMPREHENSIVE METABOLIC PANEL (CC13)
ALT: 10 U/L (ref 0–55)
AST: 12 U/L (ref 5–34)
Albumin: 3.7 g/dL (ref 3.5–5.0)
Alkaline Phosphatase: 48 U/L (ref 40–150)
BUN: 15.6 mg/dL (ref 7.0–26.0)
CO2: 27 mEq/L (ref 22–29)
Calcium: 9.7 mg/dL (ref 8.4–10.4)
Chloride: 104 mEq/L (ref 98–107)
Creatinine: 1.1 mg/dL (ref 0.6–1.1)
Glucose: 109 mg/dl — ABNORMAL HIGH (ref 70–99)
Potassium: 3.9 mEq/L (ref 3.5–5.1)
Sodium: 141 mEq/L (ref 136–145)
Total Bilirubin: 0.31 mg/dL (ref 0.20–1.20)
Total Protein: 7.2 g/dL (ref 6.4–8.3)

## 2012-10-20 LAB — CHCC SMEAR

## 2012-10-20 LAB — FERRITIN: Ferritin: 15 ng/mL (ref 10–291)

## 2012-10-26 ENCOUNTER — Ambulatory Visit (INDEPENDENT_AMBULATORY_CARE_PROVIDER_SITE_OTHER): Payer: Medicare Other | Admitting: Neurology

## 2012-10-26 ENCOUNTER — Telehealth: Payer: Self-pay | Admitting: *Deleted

## 2012-10-26 ENCOUNTER — Encounter: Payer: Self-pay | Admitting: Neurology

## 2012-10-26 VITALS — BP 134/72 | HR 92 | Temp 99.2°F | Ht 63.25 in | Wt 191.0 lb

## 2012-10-26 DIAGNOSIS — R251 Tremor, unspecified: Secondary | ICD-10-CM

## 2012-10-26 DIAGNOSIS — R259 Unspecified abnormal involuntary movements: Secondary | ICD-10-CM

## 2012-10-26 DIAGNOSIS — G4733 Obstructive sleep apnea (adult) (pediatric): Secondary | ICD-10-CM

## 2012-10-26 DIAGNOSIS — R269 Unspecified abnormalities of gait and mobility: Secondary | ICD-10-CM

## 2012-10-26 HISTORY — DX: Unspecified abnormalities of gait and mobility: R26.9

## 2012-10-26 NOTE — Telephone Encounter (Signed)
Pt called states " I  I had labs on 4/23 and would like to know the results of my iron and if I will need an Iron infusion. Reviewed with MD, no Iron infusion needed, Fe is almost normal, MD will discuss further when pt is seen on 11/11/12

## 2012-10-26 NOTE — Patient Instructions (Addendum)
I think overall you are doing fairly well and are stable at this point.  I do have some generic suggestions for you today:  Please make sure that you drink plenty of fluids. I would like for you to exercise daily for example in the form of walking 20-30 minutes every day, if you can. Please keep a regular sleep-wake schedule, keep regular meal times, do not skip any meals, eat  healthy snacks in between meals, such as fruit or nuts. Try to eat protein with every meal.   Engage in social activities in your community and with your family and try to keep up with current events by reading the newspaper or watching the news.  I do not think we need to make any changes in your medications at this point. I think you're stable enough that I can see you back in 6 months, sooner if we need to. Please call us if you have any interim questions, concerns, or problems or updates to need to discuss.  Steve is my clinical assistant and will answer any of your questions and relay your messages to me and will give you my messages.   Our phone number is [phone]. We also have an after hours call service for urgent matters and there is a physician on-call for urgent questions. For any emergencies you know to call 911 or go to the nearest emergency room.      

## 2012-10-26 NOTE — Progress Notes (Signed)
Subjective:    Patient ID: Crystal [name] is a 62 y.o. female.  HPI  Interim history:   Crystal [name] is a very pleasant 62 year old right-handed woman who presents for FU consultation of her gait disturbance and tremors. She has an underlying medical history of hypertension, hyperlipidemia, Crohn's disease, hypothyroidism, Obstructive sleep apnea and right-sided TIA in March 2005 with left-sided weakness and numbness in the face lasting 40 minutes at the time. She also has depression and a history of right Bell's palsy and back pain as well as fibromyalgia and restless leg syndrome. She has a history of interstitial cystitis and cervical and lumbar degenerative disc disease. She has previously been seen by Dr. love and was last seen by him on 07/05/2012 for her history of balance difficulties. At the time of her last visit he suggested a repeat MRI and MRA of the brain. Her falls assessment postauricular time was 10. She had an MRI and MRA of the brain which did not show any acute or new abnormalities and changes were not much progress from 2005.  She is currently on Mucinex, probiotic, tramadol, ranitidine, oxybutynin, vitamin D, omeprazole, Synthroid, benazepril, hydrochlorothiazide, gabapentin fenofibrate, lorazepam, Skelaxin and multivitamin.  I reviewed Dr. Imagene Gurney prior notes and the patient's records and below is a summary of that review:  62 year old left-handed woman with bilateral hand tremors and a family history of tremors and degenerative disc disease as well as obstructive sleep apnea on BiPAP and was on CPAP in the past, status post left knee surgery in 2010. She has a history of Crohn's disease and TIA in March 2005.   She fell on 3/14 and also had food poisoning and she had L leg pain and was checked out at a Urgent care and had X ray of the L leg. She has a history of dream enactments and was told that she has PLMs. She uses a nasal mask, but her residual R facial weakness makes it  hard for her to use the mask and she is not closing her eye. She was taken off of ASA, which was 81 mg since her last presumed TIAs in 12/13, due to her colonoscopy some 3 weeks ago and she has an appt to discuss the findings with her GI doctor next week.   She has ongoing problems with her L shoulder and had 2 surgeries on it.   Her Past Medical History Is Significant For: Past Medical History  Diagnosis Date  . Restless leg syndrome   . OSA (obstructive sleep apnea)   . Fibromyalgia   . Crohn's   . GERD (gastroesophageal reflux disease)   . Hypertension   . Rhinitis 06/25/2010  . Gait disorder 10/26/2012    Her Past Surgical History Is Significant For: Past Surgical History  Procedure Laterality Date  . Tonsillectomy    . Knee susrgery    . Shoulder adhesion release    . Spine surgery      Her Family History Is Significant For: Family History  Problem Relation Age of Onset  . Lupus Sister   . Heart disease Father   . Cancer Mother     Her Social History Is Significant For: History   Social History  . Marital Status: Married    Spouse Name: N/A    Number of Children: 2  . Years of Education: N/A   Occupational History  . Retired Engineer, site    Social History Main Topics  . Smoking status: Never Smoker   .  Smokeless tobacco: None  . Alcohol Use: None  . Drug Use: None  . Sexually Active: None   Other Topics Concern  . None   Social History Narrative  . None    Her Allergies Are:  Allergies  Allergen Reactions  . Amitriptyline   . Atorvastatin   . Bio-Flax   . Codeine   . Erythromycin   . Morphine   . Nsaids   . Pravastatin   . Sinemet (Carbidopa W-Levodopa)   . Sulfonamide Derivatives   . Zoloft (Sertraline Hcl)   :   Her Current Medications Are:  Outpatient Encounter Prescriptions as of 10/26/2012  Medication Sig Dispense Refill  . azelastine (ASTELIN) 137 MCG/SPRAY nasal spray 1 spray by Nasal route 2 (two) times daily. Use in each  nostril as directed       . benazepril (LOTENSIN) 20 MG tablet Take 20 mg by mouth daily.        . bisoprolol-hydrochlorothiazide (ZIAC) 10-6.25 MG per tablet Take 1 tablet by mouth daily.        . DULoxetine (CYMBALTA) 60 MG capsule Take 60 mg by mouth daily.        . ergocalciferol (VITAMIN D2) 50000 UNITS capsule Take 50,000 Units by mouth once a week.        . fenofibrate micronized (LOFIBRA) 134 MG capsule Take 134 mg by mouth daily before breakfast.        . gabapentin (NEURONTIN) 300 MG capsule 800 mg. 1 tablet four times a day      . levothyroxine (SYNTHROID, LEVOTHROID) 100 MCG tablet Take 100 mcg by mouth daily.        [name] LORazepam (ATIVAN) 2 MG tablet Take 2 mg by mouth every 6 (six) hours as needed.        . metaxalone (SKELAXIN) 800 MG tablet Take 800 mg by mouth 3 (three) times daily.      [name] omeprazole (PRILOSEC) 20 MG capsule Take 40 mg by mouth daily.       [name] rOPINIRole (REQUIP) 4 MG tablet Take 4 mg by mouth at bedtime.        . traMADol (ULTRAM) 50 MG tablet Take 50 mg by mouth every 6 (six) hours as needed.        [name] estradiol (VIVELLE-DOT) 0.05 MG/24HR Place 1 patch onto the skin once a week.        . methocarbamol (ROBAXIN) 500 MG tablet Take 500 mg by mouth 4 (four) times daily.        [name] oxyCODONE (OXY IR/ROXICODONE) 5 MG immediate release tablet Take 5 mg by mouth every 4 (four) hours as needed.        [name] oxyCODONE (OXYCONTIN) 10 MG 12 hr tablet Take 10 mg by mouth every 12 (twelve) hours.        . phentermine 30 MG capsule Take 30 mg by mouth every morning.        No facility-administered encounter medications on file as of 10/26/2012.  :  Review of Systems  Constitutional: Positive for fatigue.  Respiratory:       Snoring  Endocrine: Positive for cold intolerance and heat intolerance.  Musculoskeletal: Positive for myalgias and arthralgias.  Neurological: Positive for tremors and headaches.       Memory loss  Psychiatric/Behavioral: Positive for sleep disturbance  (BiPap machine, too much sleep).    Objective:  Neurologic Exam  Physical Exam Physical Examination:   Filed Vitals:   10/26/12 1334  BP: 134/72  Pulse: 92  Temp: 99.2 F (37.3 C)    General Examination: The patient is a very pleasant 62 y.o. female in no acute distress. She appears well-developed and well-nourished and well groomed.   HEENT: Normocephalic, atraumatic, pupils are equal, round and reactive to light and accommodation. Extraocular tracking is good without limitation to gaze excursion or nystagmus noted. Normal smooth pursuit is noted. Hearing is grossly intact. Tympanic membranes are clear bilaterally. Face is asymmetric with R facial palsy. peech is clear with no dysarthria noted. There is no hypophonia. There is no lip, neck or jaw tremor. Neck is supple with full range of motion. There are no carotid bruits on auscultation. Oropharynx exam reveals: adequate dental hygiene and moderate airway crowding, due to redundant soft palate. Mallampati is class II. Tongue protrudes centrally and palate elevates symmetrically. There is moderate mouth dryness.   Chest: Clear to auscultation without wheezing, rhonchi or crackles noted.  Heart: S1+S2+0, regular and normal without murmurs, rubs or gallops noted.   Abdomen: Soft, non-tender and non-distended with normal bowel sounds appreciated on auscultation.  Extremities: There is no pitting edema in the distal lower extremities bilaterally. Pedal pulses are intact.  Skin: Warm and dry without trophic changes noted. There are no varicose veins.  Musculoskeletal: exam reveals no obvious joint deformities, tenderness or joint swelling or erythema.   Neurologically:  Mental status: The patient is awake, alert and oriented in all 4 spheres. Her memory, attention, language and knowledge are appropriate. There is no aphasia, agnosia, apraxia or anomia. Speech is clear with normal prosody and enunciation. Thought process is linear. Mood  is congruent and affect is normal.  Cranial nerves are as described above under HEENT exam. In addition, shoulder shrug is normal with equal shoulder height noted. Motor exam: Normal bulk, strength and tone is noted. There is no drift, or rebound. She has a very mild UE postural and action tremor, no resting tremor. Romberg is showing swaying. Reflexes are 1+ throughout. Fine motor skills are fairly well intact with normal finger taps, normal hand movements, normal rapid alternating patting, normal foot taps and normal foot agility.   Cerebellar testing shows no dysmetria or intention tremor on finger to nose testing. Heel to shin is unremarkable bilaterally. There is no truncal or gait ataxia.   Sensory exam is intact to light touch, pinprick, vibration, temperature sense and proprioception in the upper and lower extremities.  Gait, station and balance: cautious gait and unable to do tandem. No leaning to one side is noted. Posture is age-appropriate and stance is narrow based. No problems turning are noted. Intact toe and heel stance is noted.               Assessment and Plan:   Assessment and Plan:  In summary, CARSYN BOSTER is a very pleasant 62 y.o.-year old female with a history of gait disorder and tremor, she also has OSA on BiPAP. Her physical exam is stable. She is doing fairly well at this time and I reassured the patient in that regard.  I had a long chat with the patient about my findings and the diagnosis of gait disorder, which in most often multifactorial in etiology. She will have to wait and see if she can restart the baby ASA after she sees  the GI doctor next week. We talked about maintaining a healthy lifestyle in general. I encouraged the patient to eat healthy, exercise daily and keep well hydrated, to keep a scheduled bedtime and  wake time routine, to not skip any meals and eat healthy snacks in between meals and to have protein with every meal.  As far as medications are  concerned, I recommended the following at this time: no change.  I answered all her questions today and the patient was in agreement with the above outlined plan. I would like to see the patient back in 6 months, sooner if the need arises and encouraged her to call with any interim questions, concerns, problems or updates. She was advised to bring her BiPAP machine next time and to use an eye patch at night.

## 2012-11-11 ENCOUNTER — Telehealth: Payer: Self-pay | Admitting: Oncology

## 2012-11-11 ENCOUNTER — Ambulatory Visit: Payer: Medicare Other

## 2012-11-11 ENCOUNTER — Other Ambulatory Visit: Payer: Medicare Other | Admitting: Lab

## 2012-11-11 ENCOUNTER — Ambulatory Visit (HOSPITAL_BASED_OUTPATIENT_CLINIC_OR_DEPARTMENT_OTHER): Payer: Medicare Other | Admitting: Oncology

## 2012-11-11 VITALS — BP 128/72 | HR 92 | Temp 98.9°F | Resp 18 | Ht 63.25 in | Wt 192.9 lb

## 2012-11-11 DIAGNOSIS — D510 Vitamin B12 deficiency anemia due to intrinsic factor deficiency: Secondary | ICD-10-CM

## 2012-11-11 NOTE — Progress Notes (Signed)
Reason for Referral: Anemia.   HPI: 62 year old woman native of [place] currently have been living in [place] for the majority of her life. She does have a past medical history significant for Crohn's disease, fibromyalgia, osteoarthritis among other conditions who was referred to me for evaluation for iron deficiency anemia. Patient was evaluated by her primary care physician on 09/13/2012 after she presented with symptoms of nausea vomiting and food poisoning. She was found to have a hemoglobin of 11.9 and her iron level was 12 and saturation percent of 4. At that time she was not taking her iron supplements but since then she have restarted on that. She tolerates oral iron without any complications and since then she had a CBC done and showed a hemoglobin of 12.9 white cell count of 7.4 and platelet count of 265. Her MCV was 82.1 and her iron studies showed a total iron of 64 and ferritin of 15. That was done on April 20 13,014. Since that time she reports she is feeling well her energy is a little better than it was 2 months ago. She has not reported any active bleeding at this time. She has not reported any hematochezia or melena did not report any GU or GYN bleeding.  She follows up with gastroenterology on a regular basis and have not had any active GI bleeding. She is up to speed on her colonoscopies due to her Crohn's disease.  She is currently asymptomatic and has not reported any chest pain or difficulty breathing has not reported any dyspnea on exertion she does report mild fatigue however. She is able to perform most activities of daily living without any hindrance or decline   Past Medical History  Diagnosis Date  . Restless leg syndrome   . OSA (obstructive sleep apnea)   . Fibromyalgia   . Crohn's   . GERD (gastroesophageal reflux disease)   . Hypertension   . Rhinitis 06/25/2010  . Gait disorder 10/26/2012  :  Past Surgical History  Procedure Laterality Date  .  Tonsillectomy    . Knee susrgery    . Shoulder adhesion release    . Spine surgery    :  Current Outpatient Prescriptions  Medication Sig Dispense Refill  . azelastine (ASTELIN) 137 MCG/SPRAY nasal spray 1 spray by Nasal route 2 (two) times daily. Use in each nostril as directed       . benazepril (LOTENSIN) 20 MG tablet Take 20 mg by mouth daily.        . bisoprolol-hydrochlorothiazide (ZIAC) 10-6.25 MG per tablet Take 1 tablet by mouth daily.        . DULoxetine (CYMBALTA) 60 MG capsule Take 60 mg by mouth daily.        . ergocalciferol (VITAMIN D2) 50000 UNITS capsule Take 50,000 Units by mouth once a week.        . estradiol (VIVELLE-DOT) 0.05 MG/24HR Place 1 patch onto the skin once a week.        . fenofibrate micronized (LOFIBRA) 134 MG capsule Take 134 mg by mouth daily before breakfast.        . gabapentin (NEURONTIN) 300 MG capsule 800 mg. 1 tablet four times a day      . levothyroxine (SYNTHROID, LEVOTHROID) 100 MCG tablet Take 100 mcg by mouth daily.        [name] LORazepam (ATIVAN) 2 MG tablet Take 2 mg by mouth every 6 (six) hours as needed.        . metaxalone (SKELAXIN)  800 MG tablet Take 800 mg by mouth 3 (three) times daily.      . methocarbamol (ROBAXIN) 500 MG tablet Take 500 mg by mouth 4 (four) times daily.        [name] omeprazole (PRILOSEC) 20 MG capsule Take 40 mg by mouth daily.       [name] oxyCODONE (OXY IR/ROXICODONE) 5 MG immediate release tablet Take 5 mg by mouth every 4 (four) hours as needed.        [name] oxyCODONE (OXYCONTIN) 10 MG 12 hr tablet Take 10 mg by mouth every 12 (twelve) hours.        . phentermine 30 MG capsule Take 30 mg by mouth every morning.       [name] rOPINIRole (REQUIP) 4 MG tablet Take 4 mg by mouth at bedtime.        . traMADol (ULTRAM) 50 MG tablet Take 50 mg by mouth every 6 (six) hours as needed.         No current facility-administered medications for this visit.     Allergen Reactions  . Amitriptyline   . Atorvastatin   . Bio-Flax   . Codeine    . Erythromycin   . Morphine   . Nsaids   . Pravastatin   . Sinemet (Carbidopa W-Levodopa)   . Sulfonamide Derivatives   . Zoloft (Sertraline Hcl)   :  Family History  Problem Relation Age of Onset  . Lupus Sister   . Heart disease Father   . Cancer Mother   :  History   Social History  . Marital Status: Married    Spouse Name: N/A    Number of Children: 2  . Years of Education: N/A   Occupational History  . Retired Education officer, museum    Social History Main Topics  . Smoking status: Never Smoker   . Smokeless tobacco: Not on file  . Alcohol Use: Not on file  . Drug Use: Not on file  . Sexually Active: Not on file   Other Topics Concern  . Not on file   Social History Narrative  . No narrative on file  :  A comprehensive review of systems was negative.  Exam: Blood pressure 128/72, pulse 92, temperature 98.9 F (37.2 C), temperature source Oral, resp. rate 18, height 5' 3.25" (1.607 m), weight 192 lb 14.4 oz (87.499 kg). General appearance: alert and appears stated age Head: Normocephalic, without obvious abnormality, atraumatic Neck: no adenopathy, no carotid bruit, no JVD, supple, symmetrical, trachea midline and thyroid not enlarged, symmetric, no tenderness/mass/nodules Resp: clear to auscultation bilaterally Chest wall: no tenderness Cardio: regular rate and rhythm, S1, S2 normal, no murmur, click, rub or gallop GI: soft, non-tender; bowel sounds normal; no masses,  no organomegaly Extremities: extremities normal, atraumatic, no cyanosis or edema Skin: Skin color, texture, turgor normal. No rashes or lesions  CBC    Component Value Date/Time   WBC 7.4 10/20/2012 1358   WBC 6.8 12/20/2008 0558   RBC 4.90 10/20/2012 1358   RBC 3.07* 12/20/2008 0558   HGB 12.9 10/20/2012 1358   HGB 8.8* 12/20/2008 0558   HCT 40.2 10/20/2012 1358   HCT 26.4* 12/20/2008 0558   PLT 265 10/20/2012 1358   PLT 231 12/20/2008 0558   MCV 82.1 10/20/2012 1358   MCV 86.0 12/20/2008 0558    MCH 26.4 10/20/2012 1358   MCHC 32.2 10/20/2012 1358   MCHC 33.2 12/20/2008 0558   RDW 16.9* 10/20/2012 1358   RDW 16.6* 12/20/2008 0558  LYMPHSABS 1.3 10/20/2012 1358   LYMPHSABS 1.5 12/14/2008 1240   MONOABS 0.6 10/20/2012 1358   MONOABS 0.8 12/14/2008 1240   EOSABS 0.1 10/20/2012 1358   EOSABS 0.1 12/14/2008 1240   BASOSABS 0.1 10/20/2012 1358   BASOSABS 0.1 12/14/2008 1240   Results for RISHIKA, MCCOLLOM (MRN [id number]) as of 11/11/2012 13:53  Ref. Range 10/20/2012 13:58  Iron Latest Range: 42-145 ug/dL 64  UIBC Latest Range: 125-400 ug/dL 343  TIBC Latest Range: 250-470 ug/dL 407  %SAT Latest Range: 20-55 % 16 (L)  Ferritin Latest Range: 10-291 ng/mL 15      Assessment and Plan:   62 year old woman with the following issues:  1. Iron deficiency anemia she presented with a hemoglobin of 11.9 and RDW of 16.4 with a higher level of 12 with percent saturation of 4. After oral iron replacement her levels have corrected as mentioned above. The differential diagnosis of her iron deficiency anemia was discussed today most likely this is related to an absorptive issue to her Crohn's disease. That might have been aggravated by her food poisoning and cause her iron stores to be depleted at that time. Since that time with aggressive oral replacement her hemoglobin and iron levels have corrected. I see no evidence to suggest a blood disorder I see really no evidence of any ongoing blood loss. In terms of management, I recommended continuous oral supplementation as she is doing right now I also discussed with her the role of IV iron if needed to. I would reserve the use of IV iron after her oral iron replacement becomes unsuccessful and for that reason I will recheck her iron stores in about 4 months and make that decision.   2. Gross disease: She is followed up with her gastroenterologist.   3. Followup: 4 months to repeat CBC and iron studies.

## 2012-11-11 NOTE — Telephone Encounter (Signed)
gv pt appt schedule for September.

## 2012-11-11 NOTE — Addendum Note (Signed)
Addended by: Reesa Chew on: 11/11/2012 04:17 PM   Modules accepted: Orders

## 2012-11-11 NOTE — Progress Notes (Signed)
Mailed updated medication list to patient's home 

## 2012-12-01 ENCOUNTER — Other Ambulatory Visit (HOSPITAL_COMMUNITY): Payer: Self-pay | Admitting: Internal Medicine

## 2012-12-01 DIAGNOSIS — Z79899 Other long term (current) drug therapy: Secondary | ICD-10-CM

## 2012-12-01 DIAGNOSIS — E2839 Other primary ovarian failure: Secondary | ICD-10-CM

## 2012-12-06 ENCOUNTER — Ambulatory Visit (HOSPITAL_COMMUNITY)
Admission: RE | Admit: 2012-12-06 | Discharge: 2012-12-06 | Disposition: A | Discharge status: Home / Self Care | Payer: Medicare Other | Source: Ambulatory Visit | Attending: Internal Medicine | Admitting: Internal Medicine

## 2012-12-06 DIAGNOSIS — Z79899 Other long term (current) drug therapy: Secondary | ICD-10-CM

## 2012-12-06 DIAGNOSIS — Z13820 Encounter for screening for osteoporosis: Secondary | ICD-10-CM | POA: Insufficient documentation

## 2012-12-06 DIAGNOSIS — Z780 Asymptomatic menopausal state: Secondary | ICD-10-CM | POA: Insufficient documentation

## 2012-12-06 DIAGNOSIS — E2839 Other primary ovarian failure: Secondary | ICD-10-CM

## 2013-01-10 DIAGNOSIS — M545 Low back pain, unspecified: Secondary | ICD-10-CM | POA: Insufficient documentation

## 2013-01-10 DIAGNOSIS — M47812 Spondylosis without myelopathy or radiculopathy, cervical region: Secondary | ICD-10-CM | POA: Insufficient documentation

## 2013-01-10 DIAGNOSIS — M51379 Other intervertebral disc degeneration, lumbosacral region without mention of lumbar back pain or lower extremity pain: Secondary | ICD-10-CM | POA: Insufficient documentation

## 2013-01-28 ENCOUNTER — Telehealth: Payer: Self-pay | Admitting: Neurology

## 2013-03-15 ENCOUNTER — Telehealth: Payer: Self-pay | Admitting: Oncology

## 2013-03-15 NOTE — Telephone Encounter (Signed)
Pt spouse called to cx 9/17 appt due to pt out of town. Per spouse will call back to r/s.

## 2013-03-16 ENCOUNTER — Other Ambulatory Visit: Payer: Medicare Other | Admitting: Lab

## 2013-03-16 ENCOUNTER — Ambulatory Visit: Payer: Medicare Other | Admitting: Oncology

## 2013-04-27 ENCOUNTER — Ambulatory Visit: Payer: Medicare Other | Admitting: Neurology

## 2013-04-28 ENCOUNTER — Encounter: Payer: Self-pay | Admitting: Neurology

## 2013-04-28 ENCOUNTER — Ambulatory Visit (INDEPENDENT_AMBULATORY_CARE_PROVIDER_SITE_OTHER): Payer: Medicare Other | Admitting: Neurology

## 2013-04-28 VITALS — BP 136/88 | HR 77 | Temp 98.3°F | Ht 63.0 in | Wt 187.0 lb

## 2013-04-28 DIAGNOSIS — R251 Tremor, unspecified: Secondary | ICD-10-CM

## 2013-04-28 DIAGNOSIS — G459 Transient cerebral ischemic attack, unspecified: Secondary | ICD-10-CM

## 2013-04-28 DIAGNOSIS — R259 Unspecified abnormal involuntary movements: Secondary | ICD-10-CM

## 2013-04-28 DIAGNOSIS — H9222 Otorrhagia, left ear: Secondary | ICD-10-CM

## 2013-04-28 DIAGNOSIS — G510 Bell's palsy: Secondary | ICD-10-CM

## 2013-04-28 DIAGNOSIS — R269 Unspecified abnormalities of gait and mobility: Secondary | ICD-10-CM

## 2013-04-28 DIAGNOSIS — R42 Dizziness and giddiness: Secondary | ICD-10-CM

## 2013-04-28 DIAGNOSIS — G4733 Obstructive sleep apnea (adult) (pediatric): Secondary | ICD-10-CM

## 2013-04-28 DIAGNOSIS — H9210 Otorrhea, unspecified ear: Secondary | ICD-10-CM

## 2013-04-28 NOTE — Patient Instructions (Signed)
I think overall you are doing fairly well but I do want to suggest a few things today:  Remember to drink plenty of fluid, eat healthy meals and do not skip any meals. Try to eat protein with a every meal and eat a healthy snack such as fruit or nuts in between meals. Try to keep a regular sleep-wake schedule and try to exercise daily, particularly in the form of walking, 20-30 minutes a day, if you can.   Engage in social activities in your community and with your family and try to keep up with current events by reading the newspaper or watching the news.   As far as your medications are concerned, I would like to suggest starting back on a baby aspirin, you may have had a TIA. Continue outpatient rehab and your medications as directed. As discussed, secondary prevention is key after a TIA. This means: taking care of blood sugar values or diabetes management, good blood pressure (hypertension) control and optimizing cholesterol management, exercising daily or regularly within your own mobility limitations of course and overall cardiovascular risk factor reduction, which includes screening for and treatment of obstructive sleep apnea (OSA).   As far as diagnostic testing: MRI brain , echo, carotid doppler, referral to ENT.  I would like to see you back in 3 months, sooner if we need to. Please call us with any interim questions, concerns, problems, updates or refill requests.  Please also call us for any test results so we can go over those with you on the phone. Brett Canales is my clinical assistant and will answer any of your questions and relay your messages to me and also relay most of my messages to you.  Our phone number is [phone]. We also have an after hours call service for urgent matters and there is a physician on-call for urgent questions. For any emergencies you know to call 911 or go to the nearest emergency room.

## 2013-04-28 NOTE — Progress Notes (Signed)
Subjective:    Patient ID: Crystal [name] is a 62 y.o. female.  HPI  Interim history:   Crystal [name] is a very pleasant 62 year old right-handed woman who presents for FU consultation of her gait disturbance and tremors. She is unaccompanied today. I first met her on 10/26/2012, and which time I felt she had tremors as well as a multifactorial gait disorder and I suggested no new medications at the time but reinforced the need for treatment of her sleep apnea and we talked about general cardiovascular risk reduction. She has an underlying medical history of hypertension, hyperlipidemia, Crohn's disease, hypothyroidism, Obstructive sleep apnea and right-sided TIA in March 2005, which manifested with left-sided weakness and numbness in the face lasting 40 minutes. She also has depression and a history of right Bell's palsy, chronic back pain, fibromyalgia, RLS, interstitial cystitis and cervical and lumbar degenerative disc disease.  Last month, she had sudden onset of R eye vision loss for a about 40 minutes. She saw an eye doctor, and was told she may have glaucoma, she has astigmatism. Last week, she had blood from the L ear. She has had orthostatic hypotension before. She endorsed stress and lost her son in 2010-11-08, her mother died in 2011-11-08, and her father died on 04/15/13. She is tearful today. She had counseling in the past. She has PT per ortho and per PCP. She had some vertigo a few days ago.  She has previously been seen by Dr. Sandria Manly and was last seen by him on 07/05/2012 for her history of balance difficulties. At the time of her last visit he suggested a repeat MRI and MRA of the brain. MRI and MRA of the brain did not show any acute or new abnormalities and changes were not much progressed from 11-08-03.  She has had bilateral hand tremors and a family history of tremors and degenerative disc disease as well as obstructive sleep apnea on BiPAP and was on CPAP in the past, and is status post left knee  surgery in 11/07/08.  She fell in March 3/14 and went to [facility] and was Dx with food poisoning, and she had L leg pain and had X ray of the L leg. She has a history of dream enactments and was told that she has PLMs. She uses a nasal mask, but her residual R facial weakness makes it hard for her to use the mask and she is not closing her eye. She has been on ASA 81 mg since her last presumed TIA in 12/13. She has ongoing problems with her L shoulder and had 2 surgeries on it.   Her Past Medical History Is Significant For: Past Medical History  Diagnosis Date  . Restless leg syndrome   . OSA (obstructive sleep apnea)   . Fibromyalgia   . Crohn's   . GERD (gastroesophageal reflux disease)   . Hypertension   . Rhinitis 06/25/2010  . Gait disorder 10/26/2012    Her Past Surgical History Is Significant For: Past Surgical History  Procedure Laterality Date  . Tonsillectomy    . Knee susrgery    . Shoulder adhesion release    . Spine surgery      Her Family History Is Significant For: Family History  Problem Relation Age of Onset  . Lupus Sister   . Heart disease Father   . Cancer Mother     Her Social History Is Significant For: History   Social History  . Marital Status: Married  Spouse Name: N/A    Number of Children: 2  . Years of Education: N/A   Occupational History  . Retired Engineer, site    Social History Main Topics  . Smoking status: Never Smoker   . Smokeless tobacco: None  . Alcohol Use: None  . Drug Use: None  . Sexual Activity: None   Other Topics Concern  . None   Social History Narrative  . None    Her Allergies Are:  Allergies  Allergen Reactions  . Amitriptyline   . Atorvastatin   . Bio-Flax   . Codeine   . Erythromycin   . Morphine   . Nsaids   . Pravastatin   . Sinemet [Carbidopa W-Levodopa]   . Sulfonamide Derivatives   . Zoloft [Sertraline Hcl]   :   Her Current Medications Are:  Outpatient Encounter Prescriptions as of 04/28/2013   Medication Sig Dispense Refill  . benazepril (LOTENSIN) 20 MG tablet Take 20 mg by mouth daily.        . bisoprolol-hydrochlorothiazide (ZIAC) 10-6.25 MG per tablet Take 1 tablet by mouth daily.        [name] dexlansoprazole (DEXILANT) 60 MG capsule Take 60 mg by mouth daily.      . DULoxetine (CYMBALTA) 60 MG capsule Take 60 mg by mouth daily.        . ergocalciferol (VITAMIN D2) 50000 UNITS capsule Take 50,000 Units by mouth once a week.        . fenofibrate micronized (LOFIBRA) 134 MG capsule Take 134 mg by mouth daily before breakfast.        . gabapentin (NEURONTIN) 300 MG capsule 800 mg. 1 tablet four times a day      . levothyroxine (SYNTHROID, LEVOTHROID) 100 MCG tablet Take 100 mcg by mouth daily.        [name] LORazepam (ATIVAN) 2 MG tablet Take 2 mg by mouth every 6 (six) hours as needed.        . metaxalone (SKELAXIN) 800 MG tablet Take 800 mg by mouth 3 (three) times daily.      [name] rOPINIRole (REQUIP) 4 MG tablet Take 4 mg by mouth at bedtime.        . traMADol (ULTRAM) 50 MG tablet Take 50 mg by mouth every 6 (six) hours as needed.        . Triamcinolone Acetonide (NASACORT ALLERGY 24HR NA) Place 1 spray into the nose 2 (two) times daily.      [name] omeprazole (PRILOSEC) 20 MG capsule Take 40 mg by mouth daily.       . [DISCONTINUED] azelastine (ASTELIN) 137 MCG/SPRAY nasal spray 1 spray by Nasal route 2 (two) times daily. Use in each nostril as directed        No facility-administered encounter medications on file as of 04/28/2013.  :  Review of Systems:  Out of a complete 14 point review of systems, all are reviewed and negative with the exception of these symptoms as listed below:   Review of Systems  Constitutional: Negative.   HENT: Positive for rhinorrhea and tinnitus.   Eyes: Positive for visual disturbance.  Respiratory:       Snoring  Cardiovascular: Negative.   Gastrointestinal: Positive for diarrhea and constipation.  Endocrine: Negative.   Genitourinary: Negative.    Musculoskeletal: Negative.   Skin: Negative.   Allergic/Immunologic: Negative.   Neurological: Positive for tremors.       Restless leg  Hematological: Negative.   Psychiatric/Behavioral: Positive for dysphoric mood.  Objective:  Neurologic Exam  Physical Exam Physical Examination:   Filed Vitals:   04/28/13 1446  BP: 136/88  Pulse: 77  Temp:    General Examination: The patient is a very pleasant 62 y.o. female in no acute distress. She appears well-developed and well-nourished and well groomed. She is overweight.   HEENT: Normocephalic, atraumatic, pupils are equal, round and reactive to light and accommodation. Fundoscopy is normal b/l. Extraocular tracking is good without limitation to gaze excursion or nystagmus noted. Normal smooth pursuit is noted. Hearing is grossly intact. Tympanic membranes are clear bilaterally, but there is dried blood in the L ear canal. Face is asymmetric with R facial palsy. peech is clear with no dysarthria noted. There is no hypophonia. There is no lip, neck or jaw tremor. Neck is supple with full range of motion. There are no carotid bruits on auscultation. Oropharynx exam reveals: adequate dental hygiene and moderate airway crowding, due to redundant soft palate. Mallampati is class II. Tongue protrudes centrally and palate elevates symmetrically. There is moderate mouth dryness.   Chest: Clear to auscultation without wheezing, rhonchi or crackles noted.  Heart: S1+S2+0, regular and normal without murmurs, rubs or gallops noted.   Abdomen: Soft, non-tender and non-distended with normal bowel sounds appreciated on auscultation.  Extremities: There is no pitting edema in the distal lower extremities bilaterally. Pedal pulses are intact.  Skin: Warm and dry without trophic changes noted. There are no varicose veins.  Musculoskeletal: exam reveals no obvious joint deformities, tenderness or joint swelling or erythema.   Neurologically:  Mental  status: The patient is awake, alert and oriented in all 4 spheres. Her memory, attention, language and knowledge are appropriate. There is no aphasia, agnosia, apraxia or anomia. Speech is clear with normal prosody and enunciation. Thought process is linear. Mood is congruent and affect is normal.  Cranial nerves are as described above under HEENT exam. In addition, shoulder shrug is normal with equal shoulder height noted. Motor exam: Normal bulk, strength and tone is noted. There is no drift, or rebound. She has a very mild UE postural and action tremor, no resting tremor. Romberg is showing swaying. Reflexes are 1+ throughout. Fine motor skills are fairly well intact with normal finger taps, normal hand movements, normal rapid alternating patting, normal foot taps and normal foot agility.   Cerebellar testing shows no dysmetria or intention tremor on finger to nose testing. Heel to shin is unremarkable bilaterally. There is no truncal or gait ataxia.   Sensory exam is intact to light touch, pinprick, vibration, temperature sense in the upper and lower extremities.  Gait, station and balance: cautious gait. No leaning to one side is noted. Posture is age-appropriate and stance is narrow based. No problems turning are noted. She has some trouble with tandem walk, but better than last time.   Assessment and Plan:   In summary, Crystal [name] is a very pleasant 62 year old female with a history of gait disorder and tremor, she also has OSA on BiPAP and TIA in the past, and a possible recent TIA with transient R eye vision loss. Her physical exam is stable. She is advised to get started on baby ASA and I will do echocardiogram, C doppler, and repeat MRI brain. I will refer her to ENT. She is advised to discuss stress and depression with her PCP. She is on Cymbalta. She is advised to re-start counseling.   I had a long chat with the patient about my findings and  the diagnosis of gait disorder, which in  most often multifactorial in etiology, her HX of TIA, with possible recurrence. We talked about maintaining a healthy lifestyle in general and secondary stroke preventive. I encouraged the patient to eat healthy, exercise daily and keep well hydrated, to keep a scheduled bedtime and wake time routine, to not skip any meals and eat healthy snacks in between meals and to have protein with every meal.  As far as medications are concerned, I recommended the following at this time: restart baby aspirin.  I answered all her questions today and the patient was in agreement with the above outlined plan. I would like to see the patient back in 3 months, sooner if the need arises and encouraged her to call with any interim questions, concerns, problems or updates. She was advised to continue BiPAP regularly and to use an eye patch at night.

## 2013-05-11 ENCOUNTER — Ambulatory Visit
Admission: RE | Admit: 2013-05-11 | Discharge: 2013-05-11 | Disposition: A | Discharge status: Home / Self Care | Payer: Medicare Other | Source: Ambulatory Visit | Attending: Neurology | Admitting: Neurology

## 2013-05-11 DIAGNOSIS — H9222 Otorrhagia, left ear: Secondary | ICD-10-CM

## 2013-05-11 DIAGNOSIS — R42 Dizziness and giddiness: Secondary | ICD-10-CM

## 2013-05-11 DIAGNOSIS — R269 Unspecified abnormalities of gait and mobility: Secondary | ICD-10-CM

## 2013-05-11 DIAGNOSIS — G459 Transient cerebral ischemic attack, unspecified: Secondary | ICD-10-CM

## 2013-05-12 ENCOUNTER — Telehealth: Payer: Self-pay | Admitting: Internal Medicine

## 2013-05-12 NOTE — Telephone Encounter (Signed)
Pt said she has gotten a letter from the [facility] saying that it was time for to get an update on TDAP and Zostavax . Told her to let me send her chart back and look in our records to see what we show Call pt at [phone] Sending chart back to you.

## 2013-05-12 NOTE — Progress Notes (Signed)
Quick Note:  Please advise patient that the brain MRI from 05/11/2013 was reported as normal and without any change from a brain MRI from earlier this year. Huston Foley, MD, PhD [facility]  ______

## 2013-05-16 ENCOUNTER — Ambulatory Visit (HOSPITAL_COMMUNITY): Payer: Medicare Other | Attending: Cardiology | Admitting: Cardiology

## 2013-05-16 ENCOUNTER — Encounter: Payer: Self-pay | Admitting: Cardiology

## 2013-05-16 ENCOUNTER — Other Ambulatory Visit (HOSPITAL_COMMUNITY): Payer: Self-pay | Admitting: Neurology

## 2013-05-16 DIAGNOSIS — I10 Essential (primary) hypertension: Secondary | ICD-10-CM | POA: Insufficient documentation

## 2013-05-16 DIAGNOSIS — F17200 Nicotine dependence, unspecified, uncomplicated: Secondary | ICD-10-CM | POA: Insufficient documentation

## 2013-05-16 DIAGNOSIS — R42 Dizziness and giddiness: Secondary | ICD-10-CM

## 2013-05-16 DIAGNOSIS — I079 Rheumatic tricuspid valve disease, unspecified: Secondary | ICD-10-CM | POA: Insufficient documentation

## 2013-05-16 DIAGNOSIS — G459 Transient cerebral ischemic attack, unspecified: Secondary | ICD-10-CM

## 2013-05-16 DIAGNOSIS — H9222 Otorrhagia, left ear: Secondary | ICD-10-CM

## 2013-05-16 DIAGNOSIS — E785 Hyperlipidemia, unspecified: Secondary | ICD-10-CM | POA: Insufficient documentation

## 2013-05-16 DIAGNOSIS — I059 Rheumatic mitral valve disease, unspecified: Secondary | ICD-10-CM | POA: Insufficient documentation

## 2013-05-16 DIAGNOSIS — R269 Unspecified abnormalities of gait and mobility: Secondary | ICD-10-CM

## 2013-05-16 NOTE — Progress Notes (Signed)
Echo performed. 

## 2013-05-17 NOTE — Progress Notes (Signed)
Quick Note:  Please advise patient that there was no significant problem seen in her echocardiogram which is the heart ultrasound she recently had. In particular the heart pump function seems to be okay and the heart valves look okay as well as well as the chambers. There is no further testing needed as a followup for this particular test. Please remind her to keep any other tests and appointments. Huston Foley, MD, PhD [facility]  ______

## 2013-05-19 ENCOUNTER — Ambulatory Visit: Payer: Self-pay | Admitting: *Deleted

## 2013-05-19 VITALS — BP 116/72 | HR 80 | Resp 16 | Wt 185.0 lb

## 2013-05-19 DIAGNOSIS — R350 Frequency of micturition: Secondary | ICD-10-CM

## 2013-05-19 MED ORDER — CIPROFLOXACIN HCL 500 MG PO TABS: 500.0000 mg | ORAL_TABLET | Freq: Two times a day (BID) | ORAL | Status: AC

## 2013-05-19 NOTE — Progress Notes (Signed)
Patient ID: Crystal [name], female   DOB: 08/15/1950, 62 y.o.   MRN: [id number] Pt here for nurse visit to evaluate urine for possible infection. Pt states some low back discomfort and chills at times. RX  cipro 500mg  sent to [phone]).  Pt advised to fill RX if UTI symptoms increase.  UA and C and S ordered by Melissa Smith,[phone])

## 2013-05-20 LAB — URINALYSIS, ROUTINE W REFLEX MICROSCOPIC
Glucose, UA: NEGATIVE mg/dL
Hgb urine dipstick: NEGATIVE
Nitrite: NEGATIVE
Protein, ur: NEGATIVE mg/dL
Specific Gravity, Urine: 1.024 (ref 1.005–1.030)
Urobilinogen, UA: 1 mg/dL (ref 0.0–1.0)
pH: 5.5 (ref 5.0–8.0)

## 2013-05-20 LAB — URINALYSIS, MICROSCOPIC ONLY: Bacteria, UA: NONE SEEN

## 2013-05-20 LAB — URINE CULTURE
Colony Count: NO GROWTH
Organism ID, Bacteria: NO GROWTH

## 2013-05-22 ENCOUNTER — Encounter: Payer: Self-pay | Admitting: Internal Medicine

## 2013-05-22 DIAGNOSIS — I2510 Atherosclerotic heart disease of native coronary artery without angina pectoris: Secondary | ICD-10-CM | POA: Insufficient documentation

## 2013-05-22 DIAGNOSIS — E1169 Type 2 diabetes mellitus with other specified complication: Secondary | ICD-10-CM | POA: Insufficient documentation

## 2013-05-22 DIAGNOSIS — F3341 Major depressive disorder, recurrent, in partial remission: Secondary | ICD-10-CM | POA: Insufficient documentation

## 2013-05-22 DIAGNOSIS — E785 Hyperlipidemia, unspecified: Secondary | ICD-10-CM | POA: Insufficient documentation

## 2013-05-22 DIAGNOSIS — E039 Hypothyroidism, unspecified: Secondary | ICD-10-CM | POA: Insufficient documentation

## 2013-05-22 DIAGNOSIS — E559 Vitamin D deficiency, unspecified: Secondary | ICD-10-CM | POA: Insufficient documentation

## 2013-05-23 ENCOUNTER — Ambulatory Visit: Payer: Self-pay | Admitting: Emergency Medicine

## 2013-05-23 ENCOUNTER — Encounter: Payer: Self-pay | Admitting: Emergency Medicine

## 2013-05-23 VITALS — BP 120/60 | HR 78 | Temp 98.0°F | Resp 18 | Ht 64.0 in | Wt 184.0 lb

## 2013-05-23 DIAGNOSIS — M25571 Pain in right ankle and joints of right foot: Secondary | ICD-10-CM

## 2013-05-23 DIAGNOSIS — M549 Dorsalgia, unspecified: Secondary | ICD-10-CM

## 2013-05-23 DIAGNOSIS — R5383 Other fatigue: Secondary | ICD-10-CM

## 2013-05-23 LAB — CBC WITH DIFFERENTIAL/PLATELET
Basophils Absolute: 0.1 10*3/uL (ref 0.0–0.1)
Basophils Relative: 1 % (ref 0–1)
Eosinophils Absolute: 0.1 10*3/uL (ref 0.0–0.7)
Eosinophils Relative: 3 % (ref 0–5)
HCT: 39.6 % (ref 36.0–46.0)
Hemoglobin: 13.5 g/dL (ref 12.0–15.0)
Lymphocytes Relative: 21 % (ref 12–46)
Lymphs Abs: 1.1 10*3/uL (ref 0.7–4.0)
MCH: 30.1 pg (ref 26.0–34.0)
MCHC: 34.1 g/dL (ref 30.0–36.0)
MCV: 88.4 fL (ref 78.0–100.0)
Monocytes Absolute: 0.6 10*3/uL (ref 0.1–1.0)
Monocytes Relative: 11 % (ref 3–12)
Neutro Abs: 3.4 10*3/uL (ref 1.7–7.7)
Neutrophils Relative %: 64 % (ref 43–77)
Platelets: 272 10*3/uL (ref 150–400)
RBC: 4.48 MIL/uL (ref 3.87–5.11)
RDW: 13.7 % (ref 11.5–15.5)
WBC: 5.3 10*3/uL (ref 4.0–10.5)

## 2013-05-23 LAB — BASIC METABOLIC PANEL WITH GFR
BUN: 16 mg/dL (ref 6–23)
CO2: 30 mEq/L (ref 19–32)
Calcium: 9.7 mg/dL (ref 8.4–10.5)
Chloride: 99 mEq/L (ref 96–112)
Creat: 0.97 mg/dL (ref 0.50–1.10)
GFR, Est African American: 71 mL/min
GFR, Est Non African American: 62 mL/min
Glucose, Bld: 111 mg/dL — ABNORMAL HIGH (ref 70–99)
Potassium: 4.1 mEq/L (ref 3.5–5.3)
Sodium: 139 mEq/L (ref 135–145)

## 2013-05-23 LAB — HEPATIC FUNCTION PANEL
ALT: 11 U/L (ref 0–35)
AST: 15 U/L (ref 0–37)
Albumin: 4.3 g/dL (ref 3.5–5.2)
Alkaline Phosphatase: 42 U/L (ref 39–117)
Bilirubin, Direct: 0.1 mg/dL (ref 0.0–0.3)
Indirect Bilirubin: 0.4 mg/dL (ref 0.0–0.9)
Total Bilirubin: 0.5 mg/dL (ref 0.3–1.2)
Total Protein: 6.9 g/dL (ref 6.0–8.3)

## 2013-05-23 LAB — URIC ACID: Uric Acid, Serum: 4.3 mg/dL (ref 2.4–7.0)

## 2013-05-23 MED ORDER — DULOXETINE HCL 60 MG PO CPEP: 60.0000 mg | ORAL_CAPSULE | Freq: Every day | ORAL | Status: DC

## 2013-05-23 NOTE — Patient Instructions (Signed)
Gout Gout is when your joints become red, sore, and swell (inflammed). This is caused by the buildup of uric acid crystals in the joints. Uric acid is a chemical that is normally in the blood. If the level of uric acid gets too high in the blood, these crystals form in your joints and tissues. Over time, these crystals can form into masses near the joints and tissues. These masses can destroy bone and cause the bone to look misshapen (deformed). HOME CARE   Do not take aspirin for pain.  Only take medicine as told by your doctor.  Rest the joint as much as you can. When in bed, keep sheets and blankets off painful areas.  Keep the sore joints raised (elevated).  Put warm or cold packs on painful joints. Use of warm or cold packs depends on which works best for you.  Use crutches if the painful joint is in your leg.  Drink enough fluids to keep your pee (urine) clear or pale yellow. Limit alcohol, sugary drinks, and drinks with fructose in them.  Follow your diet instructions. Pay careful attention to how much protein you eat. Include fruits, vegetables, whole grains, and fat-free or low-fat milk products in your daily diet. Talk to your doctor or dietician about the use of coffee, vitamin C, and cherries. These may help lower uric acid levels.  Keep a healthy body weight. GET HELP RIGHT AWAY IF:   You have watery poop (diarrhea), throw up (vomit), or have any side effects from medicines.  You do not feel better in 24 hours, or you are getting worse.  Your joint becomes suddenly more tender, and you have chills or a fever. MAKE SURE YOU:   Understand these instructions.  Will watch your condition.  Will get help right away if you are not doing well or get worse. Document Released: 03/25/2008 Document Revised: 10/11/2012 Document Reviewed: 09/24/2009 [facility] Patient Information 2014 [place], [place]. Back Pain, Adult Back pain is very common. The pain often gets better over time.  The cause of back pain is usually not dangerous. Most people can learn to manage their back pain on their own.  HOME CARE   Stay active. Start with short walks on flat ground if you can. Try to walk farther each day.  Do not sit, drive, or stand in one place for more than 30 minutes. Do not stay in bed.  Do not avoid exercise or work. Activity can help your back heal faster.  Be careful when you bend or lift an object. Bend at your knees, keep the object close to you, and do not twist.  Sleep on a firm mattress. Lie on your side, and bend your knees. If you lie on your back, put a pillow under your knees.  Only take medicines as told by your doctor.  Put ice on the injured area.  Put ice in a plastic bag.  Place a towel between your skin and the bag.  Leave the ice on for 15-20 minutes, 03-04 times a day for the first 2 to 3 days. After that, you can switch between ice and heat packs.  Ask your doctor about back exercises or massage.  Avoid feeling anxious or stressed. Find good ways to deal with stress, such as exercise. GET HELP RIGHT AWAY IF:   Your pain does not go away with rest or medicine.  Your pain does not go away in 1 week.  You have new problems.  You do not  feel well.  The pain spreads into your legs.  You cannot control when you poop (bowel movement) or pee (urinate).  Your arms or legs feel weak or lose feeling (numbness).  You feel sick to your stomach (nauseous) or throw up (vomit).  You have belly (abdominal) pain.  You feel like you may pass out (faint). MAKE SURE YOU:   Understand these instructions.  Will watch your condition.  Will get help right away if you are not doing well or get worse. Document Released: 12/03/2007 Document Revised: 09/08/2011 Document Reviewed: 11/04/2010 [facility] Patient Information 2014 [place], [place].

## 2013-05-24 ENCOUNTER — Ambulatory Visit (INDEPENDENT_AMBULATORY_CARE_PROVIDER_SITE_OTHER): Payer: Medicare Other

## 2013-05-24 ENCOUNTER — Other Ambulatory Visit: Payer: Self-pay | Admitting: Internal Medicine

## 2013-05-24 DIAGNOSIS — R269 Unspecified abnormalities of gait and mobility: Secondary | ICD-10-CM

## 2013-05-24 DIAGNOSIS — I669 Occlusion and stenosis of unspecified cerebral artery: Secondary | ICD-10-CM

## 2013-05-24 DIAGNOSIS — R42 Dizziness and giddiness: Secondary | ICD-10-CM

## 2013-05-24 DIAGNOSIS — H9222 Otorrhagia, left ear: Secondary | ICD-10-CM

## 2013-05-24 DIAGNOSIS — G459 Transient cerebral ischemic attack, unspecified: Secondary | ICD-10-CM

## 2013-05-24 NOTE — Progress Notes (Signed)
Subjective:    Patient ID: Crystal [name], female    DOB: 1950-09-22, 62 y.o.   MRN: [id number]  HPI Comments: 62 yo female presents with increased fatigue and back pain but notes she has been off Cymbalta x 1 week. She also notes she is not exercising regularly, staying up late and off schedule due to multiple functions she has been attending. She is not drinking enough water or eating as healthy. She did have a neg UA C/S today.   Recently finished lamisil for toe fungus and has noticed Right great nail is now black and tender. She denies trauma. She notes HX of fungus with nail removal by podiatry in past.    Current Outpatient Prescriptions on File Prior to Visit  Medication Sig Dispense Refill  . benazepril (LOTENSIN) 20 MG tablet Take 20 mg by mouth daily.        . bisoprolol-hydrochlorothiazide (ZIAC) 10-6.25 MG per tablet Take 1 tablet by mouth daily.        [name] dexlansoprazole (DEXILANT) 60 MG capsule Take 60 mg by mouth daily.      . ergocalciferol (VITAMIN D2) 50000 UNITS capsule Take 50,000 Units by mouth once a week.        . fenofibrate micronized (LOFIBRA) 134 MG capsule Take 134 mg by mouth daily before breakfast.        . gabapentin (NEURONTIN) 300 MG capsule 800 mg. 1 tablet four times a day      . levothyroxine (SYNTHROID, LEVOTHROID) 100 MCG tablet Take 100 mcg by mouth daily.        [name] LORazepam (ATIVAN) 2 MG tablet Take 2 mg by mouth every 6 (six) hours as needed.        . metaxalone (SKELAXIN) 800 MG tablet Take 800 mg by mouth 3 (three) times daily.      [name] omeprazole (PRILOSEC) 20 MG capsule Take 40 mg by mouth daily.       [name] oxybutynin (DITROPAN) 5 MG tablet Take 5 mg by mouth 3 (three) times daily.      [name] rOPINIRole (REQUIP) 4 MG tablet Take 4 mg by mouth at bedtime.        . traMADol (ULTRAM) 50 MG tablet Take 50 mg by mouth every 6 (six) hours as needed.        . Triamcinolone Acetonide (NASACORT ALLERGY 24HR NA) Place 1 spray into the nose 2 (two) times daily.        No current facility-administered medications on file prior to visit.   ALLERGIES Amitriptyline; Atorvastatin; Bio-flax; Codeine; Erythromycin; Fish oil; Flax seed; Linzess; Morphine; Nsaids; Nuvigil; Pravastatin; Sinemet; Statins; Sulfonamide derivatives; and Zoloft  Past Medical History  Diagnosis Date  . Restless leg syndrome   . OSA (obstructive sleep apnea)   . Fibromyalgia   . Crohn's   . GERD (gastroesophageal reflux disease)   . Rhinitis 06/25/2010  . Gait disorder 10/26/2012  . Hypertension   . Hyperlipidemia   . Elevated hemoglobin A1c   . Thyroid disease   . Hypothyroid   . Depression   . Vitamin D deficiency   . ASCVD (arteriosclerotic cardiovascular disease)      Review of Systems  Constitutional: Positive for fatigue.  Musculoskeletal: Positive for back pain.       Toe pain  Skin: Positive for color change.  All other systems reviewed and are negative.    BP 120/60  Pulse 78  Temp(Src) 98 F (36.7 C) (Temporal)  Resp 18  Ht  5\' 4"  (1.626 m)  Wt 184 lb (83.462 kg)  BMI 31.57 kg/m2     Objective:   Physical Exam  Nursing note and vitals reviewed. Constitutional: She appears well-developed and well-nourished.  HENT:  Head: Normocephalic and atraumatic.  Right Ear: External ear normal.  Left Ear: External ear normal.  Nose: Nose normal.  Mouth/Throat: Oropharynx is clear and moist.  Eyes: Pupils are equal, round, and reactive to light.  Neck: Normal range of motion.  Cardiovascular: Normal rate, regular rhythm, normal heart sounds and intact distal pulses.   Pulmonary/Chest: Effort normal and breath sounds normal.  Abdominal: Soft. Bowel sounds are normal. She exhibits no distension.  Obese centrally  Musculoskeletal: Normal range of motion. She exhibits tenderness.  At base of nail right great toe  Lymphadenopathy:    She has no cervical adenopathy.  Neurological: She is alert.  Skin: Skin is warm and dry.  Right great toe nail thick and  black 3/4 of nail   Psychiatric: Judgment normal.          Assessment & Plan:  1. Fatigue- Neg UTI, needs to increase H2O, exercise regularly, eat healthier, and get scheduled routine. Restart Cymbalta AD. 2. Chronic back pain- See #1 and keep f/u with ortho/ neuro 3. Toe pain- S/p Lamisil treatment, try epsom salt soaks x 1 week if increase symptoms w/c, if no change f/u podiatry

## 2013-05-30 ENCOUNTER — Ambulatory Visit (INDEPENDENT_AMBULATORY_CARE_PROVIDER_SITE_OTHER): Payer: Medicare Other | Admitting: Internal Medicine

## 2013-05-30 ENCOUNTER — Encounter: Payer: Self-pay | Admitting: Internal Medicine

## 2013-05-30 VITALS — BP 114/66 | HR 76 | Temp 97.3°F | Resp 18 | Ht 63.5 in | Wt 185.6 lb

## 2013-05-30 DIAGNOSIS — Z113 Encounter for screening for infections with a predominantly sexual mode of transmission: Secondary | ICD-10-CM

## 2013-05-30 DIAGNOSIS — Z1212 Encounter for screening for malignant neoplasm of rectum: Secondary | ICD-10-CM

## 2013-05-30 DIAGNOSIS — E782 Mixed hyperlipidemia: Secondary | ICD-10-CM

## 2013-05-30 DIAGNOSIS — E559 Vitamin D deficiency, unspecified: Secondary | ICD-10-CM

## 2013-05-30 DIAGNOSIS — R7401 Elevation of levels of liver transaminase levels: Secondary | ICD-10-CM

## 2013-05-30 DIAGNOSIS — Z0000 Encounter for general adult medical examination without abnormal findings: Secondary | ICD-10-CM

## 2013-05-30 DIAGNOSIS — Z79899 Other long term (current) drug therapy: Secondary | ICD-10-CM

## 2013-05-30 DIAGNOSIS — I10 Essential (primary) hypertension: Secondary | ICD-10-CM

## 2013-05-30 LAB — CBC WITH DIFFERENTIAL/PLATELET
Basophils Absolute: 0 10*3/uL (ref 0.0–0.1)
Basophils Relative: 1 % (ref 0–1)
Eosinophils Absolute: 0.2 10*3/uL (ref 0.0–0.7)
Eosinophils Relative: 2 % (ref 0–5)
HCT: 40.5 % (ref 36.0–46.0)
Hemoglobin: 13.9 g/dL (ref 12.0–15.0)
Lymphocytes Relative: 23 % (ref 12–46)
Lymphs Abs: 1.9 10*3/uL (ref 0.7–4.0)
MCH: 30.6 pg (ref 26.0–34.0)
MCHC: 34.3 g/dL (ref 30.0–36.0)
MCV: 89.2 fL (ref 78.0–100.0)
Monocytes Absolute: 0.7 10*3/uL (ref 0.1–1.0)
Monocytes Relative: 8 % (ref 3–12)
Neutro Abs: 5.4 10*3/uL (ref 1.7–7.7)
Neutrophils Relative %: 66 % (ref 43–77)
Platelets: 297 10*3/uL (ref 150–400)
RBC: 4.54 MIL/uL (ref 3.87–5.11)
RDW: 14.1 % (ref 11.5–15.5)
WBC: 8.2 10*3/uL (ref 4.0–10.5)

## 2013-05-30 MED ORDER — ASPIRIN 81 MG PO CHEW: 81.0000 mg | CHEWABLE_TABLET | Freq: Every day | ORAL | Status: AC

## 2013-05-30 NOTE — Progress Notes (Signed)
Patient ID: Crystal [name], female   DOB: 10-23-1950, 62 y.o.   MRN: [id number]  Annual Screening Comprehensive Examination  This very nice 62 yo [name]  presents for complete physical.  Patient has been followed for HTN (1996), OSA & RLS, GERD, Fibromyalgia, Depression, Crohn's Disease, Hypothyroidism, Prediabetes, Hyperlipidemia, and Vitamin D Deficiency.   Patient's BP has been controlled at home. Patient denies any cardiac Symptoms as chest pain, palpitations, shortness of breath, dizziness or ankle swelling.Last renal profile did find normal BUN/Creat of 19/0.89 and calculated low GFR of 69 in mild insufficiency range.   Patient's hyperlipidemia is controlled with diet and medications. Patient denies myalgias or other medication SE's. Last cholesterol last visit was  , triglycerides  , and LDL   .     Patient has prediabetes since Oct 2010 with an A1c of 6.6%. Patient attempts dietary management with last A1c  6.6% in July this year. Patient denies reactive hypoglycemic symptoms, visual blurring, diabetic polys, or paresthesias.    Patient is followed by Dr Kathie Rhodes. [place] for Fibromyalgia with classic am stiffness, fatigue and trigger points. Dr Kinnie Scales follows patient for Crohn's Disease and GERD. Patient was recently seen by Dr Frances Furbish for transient (~ 40 min.) unilateral visual field defect and had negative 2DEC, carotid dopplers and brain MRI and subsequently started on low dose bASA.[name]   Finally, patient has history of Vitamin D Deficiency with last vitamin D of 70 in July (was 33 in 2008).     Current Outpatient Prescriptions on File Prior to Visit  Medication Sig Dispense Refill  . acyclovir (ZOVIRAX) 800 MG tablet Take 800 mg by mouth daily.      . benazepril (LOTENSIN) 20 MG tablet Take 20 mg by mouth daily.        . bisoprolol-hydrochlorothiazide (ZIAC) 10-6.25 MG per tablet Take 1 tablet by mouth daily.        [name] dexlansoprazole (DEXILANT) 60 MG capsule Take 60 mg by mouth daily.       . DULoxetine (CYMBALTA) 60 MG capsule Take 1 capsule (60 mg total) by mouth daily.  90 capsule  1  . ergocalciferol (VITAMIN D2) 50000 UNITS capsule Take 50,000 Units by mouth once a week.        . fenofibrate micronized (LOFIBRA) 134 MG capsule Take 134 mg by mouth daily before breakfast.        . gabapentin (NEURONTIN) 800 MG tablet Take one tablet by mouth four times daily  120 tablet  1  . levothyroxine (SYNTHROID, LEVOTHROID) 100 MCG tablet Take 100 mcg by mouth daily.        [name] LORazepam (ATIVAN) 2 MG tablet Take 2 mg by mouth every 6 (six) hours as needed.        . metaxalone (SKELAXIN) 800 MG tablet Take 800 mg by mouth 3 (three) times daily.      [name] omeprazole (PRILOSEC) 20 MG capsule Take 40 mg by mouth daily.       [name] oxybutynin (DITROPAN) 5 MG tablet Take 5 mg by mouth 3 (three) times daily.      [name] rOPINIRole (REQUIP) 4 MG tablet Take 4 mg by mouth at bedtime.        . traMADol (ULTRAM) 50 MG tablet Take 50 mg by mouth every 6 (six) hours as needed.        . Triamcinolone Acetonide (NASACORT ALLERGY 24HR NA) Place 1 spray into the nose 2 (two) times daily.       No  current facility-administered medications on file prior to visit.    Allergies  Allergen Reactions  . Amitriptyline   . Atorvastatin   . Bio-Flax   . Codeine   . Erythromycin   . Fish Oil   . Flax Seed [Bio-Flax]   . Linzess [Linaclotide]   . Morphine   . Nsaids   . Nuvigil [Armodafinil]   . Pravastatin   . Sinemet [Carbidopa W-Levodopa]   . Statins   . Sulfonamide Derivatives   . Zoloft [Sertraline Hcl]     Past Medical History  Diagnosis Date  . Restless leg syndrome   . OSA (obstructive sleep apnea)   . Fibromyalgia   . Crohn's   . GERD (gastroesophageal reflux disease)   . Rhinitis 06/25/2010  . Gait disorder 10/26/2012  . Hypertension   . Hyperlipidemia   . Elevated hemoglobin A1c   . Thyroid disease   . Hypothyroid   . Depression   . Vitamin D deficiency   . ASCVD (arteriosclerotic  cardiovascular disease)     Past Surgical History  Procedure Laterality Date  . Tonsillectomy    . Knee susrgery    . Shoulder adhesion release    . Spine surgery    . Appendectomy  1960  . Tonsillectomy  1960  . Cesarean section  1983 & 1886  . Abdominal hysterectomy  1992  . Rotator cuff repair Left 2005    Family History  Problem Relation Age of Onset  . Cancer Sister     uterus  . Depression Sister   . Heart disease Father   . Hypertension Father   . Hyperlipidemia Father   . Cancer Mother   . Diabetes Mother   . Osteoporosis Mother   . Leukemia Mother   . Leukemia Sister   . Lupus Sister     History  Substance Use Topics  . Smoking status: Never Smoker   . Smokeless tobacco: Not on file  . Alcohol Use: Yes     Comment: rarely    ROS Constitutional: Denies fever, chills, weight loss/gain, headaches, insomnia, fatigue, night sweats, and change in appetite. Eyes: Denies redness, blurred vision, diplopia, discharge, itchy, watery eyes.  ENT: Denies discharge, congestion, post nasal drip, epistaxis, sore throat, earache, hearing loss, dental pain, Tinnitus, Vertigo, Sinus pain, snoring.  Cardio: Denies chest pain, palpitations, irregular heartbeat, syncope, dyspnea, diaphoresis, orthopnea, PND, claudication, edema Respiratory: denies cough, dyspnea, DOE, pleurisy, hoarseness, laryngitis, wheezing.  Gastrointestinal: Denies dysphagia, heartburn, reflux, water brash, pain, cramps, nausea, vomiting, bloating, diarrhea, constipation, hematemesis, melena, hematochezia, jaundice, hemorrhoids Genitourinary: Denies dysuria, frequency, urgency, nocturia, hesitancy, discharge, hematuria, flank pain Breast:Breast lumps, nipple discharge, bleeding.  Musculoskeletal: Denies arthralgia, myalgia, stiffness, Jt. Swelling, pain, limp, and strain/sprain. Skin: Denies puritis, rash, hives, warts, acne, eczema, changing in skin lesion Neuro: Weakness, tremor, incoordination, spasms,  paresthesia, pain Psychiatric: Denies confusion, memory loss, sensory loss Endocrine: Denies change in weight, skin, hair change, nocturia, and paresthesia, diabetic polys, visual blurring, hyper /hypo glycemic episodes.  Heme/Lymph: No excessive bleeding, bruising, enlarged lymph nodes.  Filed Vitals:   05/30/13 1724  BP: 114/66  Pulse: 76  Temp: 97.3 F (36.3 C)  Resp: 18   Estimated body mass index is 32.36 kg/(m^2) as calculated from the following:   Height as of this encounter: 5' 3.5" (1.613 m).   Weight as of this encounter: 185 lb 9.6 oz (84.188 kg).  Physical Exam General Appearance: Well nourished, in no apparent distress. Eyes: PERRLA, EOMs, conjunctiva no swelling or  erythema, normal fundi and vessels. Sinuses: No frontal/maxillary tenderness ENT/Mouth: EACs patent / TMs  nl. Nares clear without erythema, swelling, mucoid exudates. Oral hygiene is good. No erythema, swelling, or exudate. Tongue normal, non-obstructing. Tonsils not swollen or erythematous. Hearing normal. Rt Hemifacial paresis (fr remote Bell's Palsy). Neck: Supple, thyroid normal. No bruits, nodes or JVD. Respiratory: Respiratory effort normal.  BS equal and clear bilateral without rales, rhonci, wheezing or stridor. Cardio: Heart sounds are normal with regular rate and rhythm and no murmurs, rubs or gallops. Peripheral pulses are normal and equal bilaterally without edema. No aortic or femoral bruits. Chest: symmetric with normal excursions and percussion. Breasts: Symmetric, without lumps, nipple discharge, retractions, or fibrocystic changes.  Abdomen: Flat, soft, with bowl sounds. Nontender, no guarding, rebound, hernias, masses, or organomegaly.  Lymphatics: Non tender without lymphadenopathy.  Genitourinary:  Musculoskeletal: Full ROM all peripheral extremities, joint stability, 5/5 strength, and normal gait. Skin: Warm and dry without rashes, lesions, cyanosis, clubbing or  ecchymosis.  Neuro:  Cranial nerves intact, reflexes equal bilaterally. Normal muscle tone, no cerebellar symptoms. Sensation intact.  Pysch: Awake and oriented X 3, normal affect, Insight and Judgment appropriate.   Assessment and Plan  1. Annual Screening Examination 2. Hypertension  3. Hyperlipidemia 4. Pre Diabetes 5. Vitamin D Deficiency 6. OSA 7. RLS 8. GERD 9. Crohn's Disease 10. Depression 11. Hypothyroidism 12. Fibromyalgia  Continue prudent diet as discussed, weight control, regular exercise, and medications. Routine screening labs and tests as requested with regular follow-up as recommended.

## 2013-05-30 NOTE — Patient Instructions (Signed)

## 2013-05-31 LAB — LIPID PANEL
Cholesterol: 199 mg/dL (ref 0–200)
HDL: 40 mg/dL (ref >39)
LDL Cholesterol: 110 mg/dL — ABNORMAL HIGH (ref 0–99)
Total CHOL/HDL Ratio: 5 Ratio
Triglycerides: 243 mg/dL — ABNORMAL HIGH (ref <150)
VLDL: 49 mg/dL — ABNORMAL HIGH (ref 0–40)

## 2013-05-31 LAB — BASIC METABOLIC PANEL WITH GFR
BUN: 20 mg/dL (ref 6–23)
CO2: 30 mEq/L (ref 19–32)
Calcium: 9.9 mg/dL (ref 8.4–10.5)
Chloride: 103 mEq/L (ref 96–112)
Creat: 0.85 mg/dL (ref 0.50–1.10)
GFR, Est African American: 84 mL/min
GFR, Est Non African American: 73 mL/min
Glucose, Bld: 89 mg/dL (ref 70–99)
Potassium: 3.9 mEq/L (ref 3.5–5.3)
Sodium: 142 mEq/L (ref 135–145)

## 2013-05-31 LAB — HEPATIC FUNCTION PANEL
ALT: 11 U/L (ref 0–35)
AST: 14 U/L (ref 0–37)
Albumin: 4.5 g/dL (ref 3.5–5.2)
Alkaline Phosphatase: 45 U/L (ref 39–117)
Bilirubin, Direct: 0.1 mg/dL (ref 0.0–0.3)
Indirect Bilirubin: 0.3 mg/dL (ref 0.0–0.9)
Total Bilirubin: 0.4 mg/dL (ref 0.3–1.2)
Total Protein: 7.1 g/dL (ref 6.0–8.3)

## 2013-05-31 LAB — HIV ANTIBODY (ROUTINE TESTING W REFLEX): HIV: NONREACTIVE

## 2013-05-31 LAB — HEPATITIS B SURFACE ANTIBODY,QUALITATIVE: Hep B S Ab: NEGATIVE

## 2013-05-31 LAB — URINALYSIS, MICROSCOPIC ONLY
Bacteria, UA: NONE SEEN
Casts: NONE SEEN
Squamous Epithelial / LPF: NONE SEEN

## 2013-05-31 LAB — MICROALBUMIN / CREATININE URINE RATIO
Creatinine, Urine: 235.8 mg/dL
Microalb Creat Ratio: 6.1 mg/g (ref 0.0–30.0)
Microalb, Ur: 1.45 mg/dL (ref 0.00–1.89)

## 2013-05-31 LAB — HEPATITIS B CORE ANTIBODY, TOTAL: Hep B Core Total Ab: NONREACTIVE

## 2013-05-31 LAB — RPR

## 2013-05-31 LAB — HEPATITIS A ANTIBODY, TOTAL: Hep A Total Ab: NONREACTIVE

## 2013-05-31 LAB — HEMOGLOBIN A1C
Hgb A1c MFr Bld: 6.4 % — ABNORMAL HIGH (ref <5.7)
Mean Plasma Glucose: 137 mg/dL — ABNORMAL HIGH (ref <117)

## 2013-05-31 LAB — TSH: TSH: 3.264 u[IU]/mL (ref 0.350–4.500)

## 2013-05-31 LAB — INSULIN, FASTING: Insulin fasting, serum: 23 u[IU]/mL (ref 3–28)

## 2013-05-31 LAB — VITAMIN D 25 HYDROXY (VIT D DEFICIENCY, FRACTURES): Vit D, 25-Hydroxy: 85 ng/mL (ref 30–89)

## 2013-05-31 LAB — HEPATITIS C ANTIBODY: HCV Ab: NEGATIVE

## 2013-05-31 LAB — VITAMIN B12: Vitamin B-12: 561 pg/mL (ref 211–911)

## 2013-05-31 LAB — MAGNESIUM: Magnesium: 2 mg/dL (ref 1.5–2.5)

## 2013-06-03 ENCOUNTER — Telehealth: Payer: Self-pay | Admitting: *Deleted

## 2013-06-03 LAB — HEPATITIS B E ANTIBODY: Hepatitis Be Antibody: NEGATIVE

## 2013-06-05 ENCOUNTER — Other Ambulatory Visit: Payer: Self-pay | Admitting: Internal Medicine

## 2013-06-05 DIAGNOSIS — M790 Rheumatism, unspecified: Secondary | ICD-10-CM

## 2013-06-05 MED ORDER — METAXALONE 800 MG PO TABS: 800.0000 mg | ORAL_TABLET | Freq: Three times a day (TID) | ORAL | Status: DC

## 2013-06-07 NOTE — Telephone Encounter (Signed)
DONE

## 2013-06-08 ENCOUNTER — Telehealth: Payer: Self-pay

## 2013-06-08 ENCOUNTER — Encounter: Payer: Self-pay | Admitting: Emergency Medicine

## 2013-06-08 NOTE — Telephone Encounter (Signed)
Pt was advised to schedule appt w/ ortho. Pt aware.

## 2013-06-08 NOTE — Telephone Encounter (Signed)
Message copied by Joya Martyr on Wed Jun 08, 2013  4:30 PM ------      Message from: Loree Fee R      Created: Wed Jun 08, 2013  1:02 PM       Please inform patient since no relief with her leg/ buttock ache/ pain/ jerking she needs to cal lher already established othopedic and schedule appointment for evaluation. ------

## 2013-06-08 NOTE — Progress Notes (Unsigned)
Patient ID: Crystal [name], female   DOB: [birth year], 62 y.o.   MRN: [id number]   Patient called with continued pain of right buttocks and leg, despite increased Tramadol, will advise she needs to call Ortho to set up re-evaluation, since she is already established.

## 2013-06-20 ENCOUNTER — Ambulatory Visit: Payer: Medicare Other | Admitting: Neurology

## 2013-07-12 ENCOUNTER — Telehealth: Payer: Self-pay | Admitting: *Deleted

## 2013-07-12 NOTE — Telephone Encounter (Signed)
Please advise patient that her carotid artery ultrasound was unremarkable, her echocardiogram was within normal limits and her brain MRI showed no changes from before.

## 2013-07-12 NOTE — Telephone Encounter (Signed)
Called patient to inform her about her Ultrasound, MRI and echocardiogram being normal. I advised the patient that if she has any other problems, questions or concerns to call the office. Patient stated that she would like to know if she could be cleared to go ahead and schedule her surgery for her hand. Please advise.

## 2013-07-12 NOTE — Telephone Encounter (Signed)
Please advise 

## 2013-07-12 NOTE — Telephone Encounter (Signed)
Surgical clearance should come from primary care physician. Please advise patient.

## 2013-07-13 NOTE — Telephone Encounter (Signed)
Informed patient per Dr Teofilo PodAthar's note to consult with pcp, she verbalized understanding but said that Dr. Frances FurbishAthar had suggested not to have the surgery, said that she will contact Pcp

## 2013-07-21 DIAGNOSIS — M5417 Radiculopathy, lumbosacral region: Secondary | ICD-10-CM | POA: Insufficient documentation

## 2013-07-25 ENCOUNTER — Other Ambulatory Visit: Payer: Self-pay | Admitting: Physician Assistant

## 2013-07-25 MED ORDER — LORAZEPAM 2 MG PO TABS: 2.0000 mg | ORAL_TABLET | Freq: Every evening | ORAL | Status: DC | PRN

## 2013-07-26 NOTE — Progress Notes (Signed)
[facility] TO CALL TO SCHEDULE A MARCH,JUNE & SEPT APPT

## 2013-08-18 ENCOUNTER — Encounter: Payer: Self-pay | Admitting: Neurology

## 2013-08-26 NOTE — Progress Notes (Signed)
Quick Note:  I reviewed the patient's PAP compliance data from 05/11/2013 to 08/08/2013, which is a total of 90 days, during which time the patient used PAP only 62 days. The average usage for all days was 3 hours and 57 minutes. The percent used days greater than 4 hours was only 53 %, indicating fair to poor compliance. The residual AHI was 1.5 per hour, indicating an adequate treatment pressure. We will have to get in touch with the patient and her home health company regarding her lapses and compliance and find out what the reasons may be to prevent her from being fully compliant. Her next appointment with me on his own 09/13/2013 at 3 PM.  Huston Foley, MD, PhD [facility] ([facility]   ______

## 2013-08-31 ENCOUNTER — Encounter: Payer: Self-pay | Admitting: Neurology

## 2013-09-07 ENCOUNTER — Encounter: Payer: Self-pay | Admitting: Physician Assistant

## 2013-09-07 ENCOUNTER — Ambulatory Visit (INDEPENDENT_AMBULATORY_CARE_PROVIDER_SITE_OTHER): Payer: Medicare Other | Admitting: Physician Assistant

## 2013-09-07 VITALS — BP 118/68 | HR 72 | Temp 97.5°F | Resp 16 | Ht 64.0 in | Wt 182.0 lb

## 2013-09-07 DIAGNOSIS — F329 Major depressive disorder, single episode, unspecified: Secondary | ICD-10-CM

## 2013-09-07 DIAGNOSIS — F3289 Other specified depressive episodes: Secondary | ICD-10-CM

## 2013-09-07 DIAGNOSIS — E785 Hyperlipidemia, unspecified: Secondary | ICD-10-CM

## 2013-09-07 DIAGNOSIS — E039 Hypothyroidism, unspecified: Secondary | ICD-10-CM

## 2013-09-07 DIAGNOSIS — Z0000 Encounter for general adult medical examination without abnormal findings: Secondary | ICD-10-CM

## 2013-09-07 DIAGNOSIS — E559 Vitamin D deficiency, unspecified: Secondary | ICD-10-CM

## 2013-09-07 DIAGNOSIS — R7309 Other abnormal glucose: Secondary | ICD-10-CM

## 2013-09-07 DIAGNOSIS — I10 Essential (primary) hypertension: Secondary | ICD-10-CM

## 2013-09-07 DIAGNOSIS — F32A Depression, unspecified: Secondary | ICD-10-CM

## 2013-09-07 LAB — CBC WITH DIFFERENTIAL/PLATELET
Basophils Absolute: 0.1 10*3/uL (ref 0.0–0.1)
Basophils Relative: 1 % (ref 0–1)
Eosinophils Absolute: 0.1 10*3/uL (ref 0.0–0.7)
Eosinophils Relative: 2 % (ref 0–5)
HCT: 42.6 % (ref 36.0–46.0)
Hemoglobin: 14.4 g/dL (ref 12.0–15.0)
Lymphocytes Relative: 21 % (ref 12–46)
Lymphs Abs: 1.4 10*3/uL (ref 0.7–4.0)
MCH: 30.1 pg (ref 26.0–34.0)
MCHC: 33.8 g/dL (ref 30.0–36.0)
MCV: 88.9 fL (ref 78.0–100.0)
Monocytes Absolute: 0.6 10*3/uL (ref 0.1–1.0)
Monocytes Relative: 8 % (ref 3–12)
Neutro Abs: 4.7 10*3/uL (ref 1.7–7.7)
Neutrophils Relative %: 68 % (ref 43–77)
Platelets: 293 10*3/uL (ref 150–400)
RBC: 4.79 MIL/uL (ref 3.87–5.11)
RDW: 13.8 % (ref 11.5–15.5)
WBC: 6.9 10*3/uL (ref 4.0–10.5)

## 2013-09-07 LAB — HEMOGLOBIN A1C
Hgb A1c MFr Bld: 6 % — ABNORMAL HIGH (ref <5.7)
Mean Plasma Glucose: 126 mg/dL — ABNORMAL HIGH (ref <117)

## 2013-09-07 MED ORDER — LEVOTHYROXINE SODIUM 112 MCG PO TABS: 112.0000 ug | ORAL_TABLET | Freq: Every day | ORAL | Status: DC

## 2013-09-07 MED ORDER — RANITIDINE HCL 150 MG PO TABS: 150.0000 mg | ORAL_TABLET | Freq: Every day | ORAL | Status: DC

## 2013-09-07 MED ORDER — CLONAZEPAM 2 MG PO TABS: 2.0000 mg | ORAL_TABLET | Freq: Two times a day (BID) | ORAL | Status: DC | PRN

## 2013-09-07 MED ORDER — PILOCARPINE HCL 5 MG PO TABS: 5.0000 mg | ORAL_TABLET | Freq: Every day | ORAL | Status: DC

## 2013-09-07 MED ORDER — CLONAZEPAM 2 MG PO TABS: 2.0000 mg | ORAL_TABLET | Freq: Two times a day (BID) | ORAL | Status: DC

## 2013-09-07 NOTE — Progress Notes (Signed)
Subjective:   Crystal [name] is a 63 y.o. female who presents for Medicare Annual Wellness Visit and 3 month follow up on hypertension, prediabetes, hyperlipidemia, vitamin D def.  Date of last medicare wellness visit is unknown.   Her blood pressure has been controlled at home, today their BP is BP: 118/68 mmHg She was walking, doing PT, and joined [facility] but then she stepped down wrong, and had right thigh/back pain, normal hip, after MRI on back and neck. She denies chest pain, shortness of breath, dizziness.  Her cholesterol is diet controlled. Her cholesterol is controlled. The cholesterol last visit was:   Lab Results  Component Value Date   CHOL 199 05/30/2013   HDL 40 05/30/2013   LDLCALC 110* 05/30/2013   TRIG 243* 05/30/2013   CHOLHDL 5.0 05/30/2013   She has been working on diet and exercise for prediabetes, and has no polyuria or polydipsia, no chest pain, dyspnea or TIAs, no numbness, tingling or pain in extremities. Last A1C in the office was:  Lab Results  Component Value Date   HGBA1C 6.4* 05/30/2013   Patient is on Vitamin D supplement.  Saw Dr. Micheal Likens for left shoulder for torn rotator cuff, doing PT,but now her right shoulder is hurting going down to two fingers.  Not sleeping well, waking up a 630-530, sleeps 3-4 a night and poor sleep, she is on CPAP.   Names of Other Physician/Practitioners you currently use: 1. [facility]- here for primary care 2. Dr. Janyth Contes, [place] [facility] eye doctor, last visit 07/2013 3. Dr. Ignatius Specking,  dentist, last visit 05/2013 Patient Care Team: Unk Pinto, MD as PCP - General (Internal Medicine)  Medication Review Current Outpatient Prescriptions on File Prior to Visit  Medication Sig Dispense Refill  . acyclovir (ZOVIRAX) 800 MG tablet Take 800 mg by mouth 3 (three) times daily.       [name] aspirin (ASPIRIN CHILDRENS) 81 MG chewable tablet Chew 1 tablet (81 mg total) by mouth daily.      . benazepril  (LOTENSIN) 20 MG tablet Take 20 mg by mouth daily.        . bisoprolol-hydrochlorothiazide (ZIAC) 10-6.25 MG per tablet Take 1 tablet by mouth daily.        [name] dexlansoprazole (DEXILANT) 60 MG capsule Take 60 mg by mouth daily.      . DULoxetine (CYMBALTA) 60 MG capsule Take 1 capsule (60 mg total) by mouth daily.  90 capsule  1  . ergocalciferol (VITAMIN D2) 50000 UNITS capsule Take 50,000 Units by mouth 3 (three) times a week.       . fenofibrate micronized (LOFIBRA) 134 MG capsule Take 134 mg by mouth daily before breakfast.        . gabapentin (NEURONTIN) 800 MG tablet Take one tablet by mouth four times daily  120 tablet  1  . levothyroxine (SYNTHROID, LEVOTHROID) 100 MCG tablet Take 100 mcg by mouth daily.       [name] LORazepam (ATIVAN) 2 MG tablet Take 1 tablet (2 mg total) by mouth at bedtime as needed.  30 tablet  3  . metaxalone (SKELAXIN) 800 MG tablet Take 1 tablet (800 mg total) by mouth 3 (three) times daily.  270 tablet  99  . oxybutynin (DITROPAN) 5 MG tablet Take 5 mg by mouth 3 (three) times daily.      . pilocarpine (SALAGEN) 5 MG tablet Take 5 mg by mouth daily.      [name] rOPINIRole (REQUIP) 4  MG tablet Take 4 mg by mouth at bedtime.        . traMADol (ULTRAM) 50 MG tablet Take 50 mg by mouth every 6 (six) hours as needed.        . Triamcinolone Acetonide (NASACORT ALLERGY 24HR NA) Place 1 spray into the nose 2 (two) times daily.       No current facility-administered medications on file prior to visit.    Current Problems (verified) Patient Active Problem List   Diagnosis Date Noted  . Hyperlipidemia   . Elevated hemoglobin A1c   . Hypothyroid   . Depression   . Vitamin D deficiency   . ASCVD (arteriosclerotic cardiovascular disease)   . Gait disorder 10/26/2012  . Tremor 10/26/2012  . Rhinitis 06/25/2010  . OBSTRUCTIVE SLEEP APNEA 06/21/2010  . RESTLESS LEG SYNDROME 06/21/2010  . HYPERTENSION 06/21/2010  . GERD 06/21/2010  . CROHN'S DISEASE 06/21/2010  . FIBROMYALGIA  06/21/2010    Screening Tests Health Maintenance  Topic Date Due  . Pap Smear  09/26/1966  . Colonoscopy  09/26/1998  . Influenza Vaccine  01/28/2013  . Tetanus/tdap  06/30/2014  . Mammogram  09/25/2014  . Zostavax  Completed    Health Maintenance Topics with due status: Overdue     Topic Date Due   PAP SMEAR 09/26/1966   COLONOSCOPY 09/26/1998   INFLUENZA VACCINE 01/28/2013    Immunization History  Administered Date(s) Administered  . Influenza Whole 03/30/2010  . Influenza-Unspecified 03/31/2011  . Pneumococcal-Unspecified 06/30/2005  . Td 06/30/2004  . Zoster 07/01/2007    Preventative care: Last colonoscopy: Dr. Earlean Shawl 2009 due this year Last mammogram: 08/2012 Last pap smear/pelvic exam: remote TAH/BSO  DEXA: 11/2012  Prior vaccinations: TD or Tdap: 2006  Influenza: 02/2013  Pneumococcal: 2007 Shingles/Zostavax: 2009  History reviewed: allergies, current medications, past family history, past medical history, past social history, past surgical history and problem list  Risk Factors: Osteoporosis: postmenopausal estrogen deficiency and amenorrhea History of fracture in the past year: no  Tobacco History  Substance Use Topics  . Smoking status: Never Smoker   . Smokeless tobacco: Not on file  . Alcohol Use: Yes     Comment: rarely    She does not smoke.  Patient is not a former smoker. Are there smokers in your home (other than you)?  No  Alcohol Current alcohol use: social drinker  Caffeine Current caffeine use: coffee 1 /day  Exercise Exercise limitations: The patient is experiencing exercise intolerance (ortho). Current exercise: none  Nutrition/Diet Current diet: in general, a "healthy" diet    Cardiac risk factors: dyslipidemia, family history of premature cardiovascular disease, hypertension, obesity (BMI >= 30 kg/m2) and sedentary lifestyle.  Depression Screen Nurse depression screen reviewed.  (Note: if answer to either of the  following is "Yes", a more complete depression screening is indicated)   Q1: Over the past two weeks, have you felt down, depressed or hopeless? No  Q2: Over the past two weeks, have you felt little interest or pleasure in doing things? No  Have you lost interest or pleasure in daily life? No  Do you often feel hopeless? No  Do you cry easily over simple problems? No  Activities of Daily Living Nurse ADLs screen reviewed.  In your present state of health, do you have any difficulty performing the following activities?:  Driving? No Managing money?  No Feeding yourself? No Getting from bed to chair? No Climbing a flight of stairs? Yes Preparing food and eating?: No Bathing  or showering? No Getting dressed: No Getting to the toilet? No Using the toilet:No Moving around from place to place: No In the past year have you fallen or had a near fall?:Yes   Are you sexually active?  No  Do you have more than one partner?  No  Vision Difficulties: yes  Hearing Difficulties: No Do you often ask people to speak up or repeat themselves? No Do you experience ringing or noises in your ears? No Do you have difficulty understanding soft or whispered voices? No  Cognition  Do you feel that you have a problem with memory? Yes  Do you often misplace items? Yes  Do you feel safe at home?  Yes  Advanced directives Does patient have a [place]? Yes Does patient have a Living Will? Yes    Objective:     Vision and hearing screens reviewed.   Blood pressure 118/68, pulse 72, temperature 97.5 F (36.4 C), resp. rate 16, height 5' 4"  (1.626 m), weight 182 lb (82.555 kg). Body mass index is 31.22 kg/(m^2).  General appearance: alert, no distress, WD/WN,  female Cognitive Testing  Alert? Yes  Normal Appearance?Yes  Oriented to person? Yes  Place? Yes   Time? Yes  Recall of three objects?  Yes  Can perform simple calculations? Yes  Displays appropriate  judgment?Yes  Can read the correct time from a watch face?Yes  HEENT: normocephalic, sclerae anicteric, TMs pearly, nares patent, no discharge or erythema, pharynx normal Oral cavity: MMM, no lesions Neck: supple, no lymphadenopathy, no thyromegaly, no masses Heart: RRR, normal S1, S2, no murmurs Lungs: CTA bilaterally, no wheezes, rhonchi, or rales Abdomen: +bs, soft, non tender, non distended, no masses, no hepatomegaly, no splenomegaly Musculoskeletal: nontender, no swelling, no obvious deformity Extremities: no edema, no cyanosis, no clubbing Pulses: 2+ symmetric, upper and lower extremities, normal cap refill Neurological: alert, oriented x 3, CN2-12 intact, strength normal upper extremities and lower extremities, sensation normal throughout, DTRs 2+ throughout, no cerebellar signs, gait normal Psychiatric: normal affect, behavior normal, pleasant  Breast: defer Gyn: defer Rectal:    Assessment:   Insomnia- stop lorazepam and try klonopin 5 for possible RLS  Dry mouth/sores in mouth- retry the pilocarpine  GERD/gas- can do tums, or zantac   Hypertension- Plan: CBC with Differential, BASIC METABOLIC PANEL WITH GFR, Hepatic function panel  Hypothyroid - Plan: TSH  Hyperlipidemia - Plan: Lipid panel  Elevated hemoglobin A1c - Plan: Hemoglobin A1c, Insulin, fasting  Vitamin D deficiency - Plan: Vit D  25 hydroxy (rtn osteoporosis monitoring)  Routine general medical examination at a health care facility - Plan: Magnesium    Plan:   During the course of the visit the patient was educated and counseled about appropriate screening and preventive services including:    Influenza vaccine  Screening electrocardiogram  Screening mammography  Bone densitometry screening  Colorectal cancer screening  Diabetes screening  Glaucoma screening  Nutrition counseling   Screening recommendations, referrals:  Vaccinations: Tdap vaccine not done  Influenza vaccine  yes Pneumococcal vaccine not done Shingles vaccine not done Hep B vaccine not done  Nutrition assessed and recommended  Colonoscopy yes Mammogram yes Pap smear not done Pelvic exam not done Recommended yearly ophthalmology/optometry visit for glaucoma screening and checkup Recommended yearly dental visit for hygiene and checkup Advanced directives - not done  Conditions/risks identified: BMI: Discussed weight loss, diet, and increase physical activity.  Increase physical activity: AHA recommends 150 minutes of physical activity a week.  Medications reviewed DEXA- next year Diabetes is at goal, ACE/ARB therapy: Yes. Urinary Incontinence is not an issue: discussed non pharmacology and pharmacology options.  Fall risk: high- discussed PT, home fall assessment, medications.   Medicare Attestation I have personally reviewed: The patient's medical and social history Their use of alcohol, tobacco or illicit drugs Their current medications and supplements The patient's functional ability including ADLs,fall risks, home safety risks, cognitive, and hearing and visual impairment Diet and physical activities Evidence for depression or mood disorders  The patient's weight, height, BMI, and visual acuity have been recorded in the chart.  I have made referrals, counseling, and provided education to the patient based on review of the above and I have provided the patient with a written personalized care plan for preventive services.     Vicie Mutters, PA-C   09/07/2013

## 2013-09-07 NOTE — Patient Instructions (Signed)
Stop lorazepam and try the klonopin- can take 1/4 at lunch or dinner and can take 1/2 or 1 whole tablet at night.  Stop oxybutynin and try myrbetriq at night 51m then 50 if needed- i'm hoping this will help decrease your anxiety and dry mouth Try the pilocarpine for 1 month to see if it helps dry mouth Continue dexliant and can take ranitindine at night as needed.   Preventative Care for Adults - Female      MAINTAIN REGULAR HEALTH EXAMS:  A routine yearly physical is a good way to check in with your primary care provider about your health and preventive screening. It is also an opportunity to share updates about your health and any concerns you have, and receive a thorough all-over exam.   Most health insurance companies pay for at least some preventative services.  Check with your health plan for specific coverages.  WHAT PREVENTATIVE SERVICES DO WOMEN NEED?  Adult women should have their weight and blood pressure checked regularly.   Women age 65and older should have their cholesterol levels checked regularly.  Women should be screened for cervical cancer with a Pap smear and pelvic exam beginning at either age 650 or 3 years after they become sexually activity.    Breast cancer screening generally begins at age 6595with a mammogram and breast exam by your primary care provider.    Beginning at age 6528and continuing to age 65 women should be screened for colorectal cancer.  Certain people may need continued testing until age 65  Updating vaccinations is part of preventative care.  Vaccinations help protect against diseases such as the flu.  Osteoporosis is a disease in which the bones lose minerals and strength as we age. Women ages 65and over should discuss this with their caregivers, as should women after menopause who have other risk factors.  Lab tests are generally done as part of preventative care to screen for anemia and blood disorders, to screen for problems with the  kidneys and liver, to screen for bladder problems, to check blood sugar, and to check your cholesterol level.  Preventative services generally include counseling about diet, exercise, avoiding tobacco, drugs, excessive alcohol consumption, and sexually transmitted infections.    GENERAL RECOMMENDATIONS FOR GOOD HEALTH:  Healthy diet:  Eat a variety of foods, including fruit, vegetables, animal or vegetable protein, such as meat, fish, chicken, and eggs, or beans, lentils, tofu, and grains, such as rice.  Drink plenty of water daily.  Decrease saturated fat in the diet, avoid lots of red meat, processed foods, sweets, fast foods, and fried foods.  Exercise:  Aerobic exercise helps maintain good heart health. At least 30-40 minutes of moderate-intensity exercise is recommended. For example, a brisk walk that increases your heart rate and breathing. This should be done on most days of the week.   Find a type of exercise or a variety of exercises that you enjoy so that it becomes a part of your daily life.  Examples are running, walking, swimming, water aerobics, and biking.  For motivation and support, explore group exercise such as aerobic class, spin class, Zumba, Yoga,or  martial arts, etc.    Set exercise goals for yourself, such as a certain weight goal, walk or run in a race such as a 5k walk/run.  Speak to your primary care provider about exercise goals.  Disease prevention:  If you smoke or chew tobacco, find out from your caregiver how to quit. It can  literally save your life, no matter how long you have been a tobacco user. If you do not use tobacco, never begin.   Maintain a healthy diet and normal weight. Increased weight leads to problems with blood pressure and diabetes.   The Body Mass Index or BMI is a way of measuring how much of your body is fat. Having a BMI above 27 increases the risk of heart disease, diabetes, hypertension, stroke and other problems related to obesity.  Your caregiver can help determine your BMI and based on it develop an exercise and dietary program to help you achieve or maintain this important measurement at a healthful level.  High blood pressure causes heart and blood vessel problems.  Persistent high blood pressure should be treated with medicine if weight loss and exercise do not work.   Fat and cholesterol leaves deposits in your arteries that can block them. This causes heart disease and vessel disease elsewhere in your body.  If your cholesterol is found to be high, or if you have heart disease or certain other medical conditions, then you may need to have your cholesterol monitored frequently and be treated with medication.   Ask if you should have a cardiac stress test if your history suggests this. A stress test is a test done on a treadmill that looks for heart disease. This test can find disease prior to there being a problem.  Menopause can be associated with physical symptoms and risks. Hormone replacement therapy is available to decrease these. You should talk to your caregiver about whether starting or continuing to take hormones is right for you.   Osteoporosis is a disease in which the bones lose minerals and strength as we age. This can result in serious bone fractures. Risk of osteoporosis can be identified using a bone density scan. Women ages 23 and over should discuss this with their caregivers, as should women after menopause who have other risk factors. Ask your caregiver whether you should be taking a calcium supplement and Vitamin D, to reduce the rate of osteoporosis.   Avoid drinking alcohol in excess (more than two drinks per day).  Avoid use of street drugs. Do not share needles with anyone. Ask for professional help if you need assistance or instructions on stopping the use of alcohol, cigarettes, and/or drugs.  Brush your teeth twice a day with fluoride toothpaste, and floss once a day. Good oral hygiene prevents  tooth decay and gum disease. The problems can be painful, unattractive, and can cause other health problems. Visit your dentist for a routine oral and dental check up and preventive care every 6-12 months.   Look at your skin regularly.  Use a mirror to look at your back. Notify your caregivers of changes in moles, especially if there are changes in shapes, colors, a size larger than a pencil eraser, an irregular border, or development of new moles.  Safety:  Use seatbelts 100% of the time, whether driving or as a passenger.  Use safety devices such as hearing protection if you work in environments with loud noise or significant background noise.  Use safety glasses when doing any work that could send debris in to the eyes.  Use a helmet if you ride a bike or motorcycle.  Use appropriate safety gear for contact sports.  Talk to your caregiver about gun safety.  Use sunscreen with a SPF (or skin protection factor) of 15 or greater.  Lighter skinned people are at a greater risk of  skin cancer. Don't forget to also wear sunglasses in order to protect your eyes from too much damaging sunlight. Damaging sunlight can accelerate cataract formation.   Practice safe sex. Use condoms. Condoms are used for birth control and to help reduce the spread of sexually transmitted infections (or STIs).  Some of the STIs are gonorrhea (the clap), chlamydia, syphilis, trichomonas, herpes, HPV (human papilloma virus) and HIV (human immunodeficiency virus) which causes AIDS. The herpes, HIV and HPV are viral illnesses that have no cure. These can result in disability, cancer and death.   Keep carbon monoxide and smoke detectors in your home functioning at all times. Change the batteries every 6 months or use a model that plugs into the wall.   Vaccinations:  Stay up to date with your tetanus shots and other required immunizations. You should have a booster for tetanus every 10 years. Be sure to get your flu shot every  year, since 5%-20% of the [place] population comes down with the flu. The flu vaccine changes each year, so being vaccinated once is not enough. Get your shot in the fall, before the flu season peaks.   Other vaccines to consider:  Human Papilloma Virus or HPV causes cancer of the cervix, and other infections that can be transmitted from person to person. There is a vaccine for HPV, and females should get immunized between the ages of 27 and 25. It requires a series of 3 shots.   Pneumococcal vaccine to protect against certain types of pneumonia.  This is normally recommended for adults age 54 or older.  However, adults younger than 65 years old with certain underlying conditions such as diabetes, heart or lung disease should also receive the vaccine.  Shingles vaccine to protect against Varicella Zoster if you are older than age 68, or younger than 65 years old with certain underlying illness.  Hepatitis A vaccine to protect against a form of infection of the liver by a virus acquired from food.  Hepatitis B vaccine to protect against a form of infection of the liver by a virus acquired from blood or body fluids, particularly if you work in health care.  If you plan to travel internationally, check with your local health department for specific vaccination recommendations.  Cancer Screening:  Breast cancer screening is essential to preventive care for women. All women age 72 and older should perform a breast self-exam every month. At age 11 and older, women should have their caregiver complete a breast exam each year. Women at ages 34 and older should have a mammogram (x-ray film) of the breasts. Your caregiver can discuss how often you need mammograms.    Cervical cancer screening includes taking a Pap smear (sample of cells examined under a microscope) from the cervix (end of the uterus). It also includes testing for HPV (Human Papilloma Virus, which can cause cervical cancer). Screening and a  pelvic exam should begin at age 3, or 3 years after a woman becomes sexually active. Screening should occur every year, with a Pap smear but no HPV testing, up to age 94. After age 24, you should have a Pap smear every 3 years with HPV testing, if no HPV was found previously.   Most routine colon cancer screening begins at the age of 94. On a yearly basis, doctors may provide special easy to use take-home tests to check for hidden blood in the stool. Sigmoidoscopy or colonoscopy can detect the earliest forms of colon cancer and is life saving.  These tests use a small camera at the end of a tube to directly examine the colon. Speak to your caregiver about this at age 32, when routine screening begins (and is repeated every 5 years unless early forms of pre-cancerous polyps or small growths are found).

## 2013-09-08 LAB — HEPATIC FUNCTION PANEL
ALT: 11 U/L (ref 0–35)
AST: 13 U/L (ref 0–37)
Albumin: 4.3 g/dL (ref 3.5–5.2)
Alkaline Phosphatase: 35 U/L — ABNORMAL LOW (ref 39–117)
Bilirubin, Direct: 0.1 mg/dL (ref 0.0–0.3)
Indirect Bilirubin: 0.4 mg/dL (ref 0.2–1.2)
Total Bilirubin: 0.5 mg/dL (ref 0.2–1.2)
Total Protein: 6.7 g/dL (ref 6.0–8.3)

## 2013-09-08 LAB — VITAMIN D 25 HYDROXY (VIT D DEFICIENCY, FRACTURES): Vit D, 25-Hydroxy: 109 ng/mL — ABNORMAL HIGH (ref 30–89)

## 2013-09-08 LAB — LIPID PANEL
Cholesterol: 194 mg/dL (ref 0–200)
HDL: 42 mg/dL (ref >39)
LDL Cholesterol: 123 mg/dL — ABNORMAL HIGH (ref 0–99)
Total CHOL/HDL Ratio: 4.6 Ratio
Triglycerides: 143 mg/dL (ref <150)
VLDL: 29 mg/dL (ref 0–40)

## 2013-09-08 LAB — BASIC METABOLIC PANEL WITH GFR
BUN: 16 mg/dL (ref 6–23)
CO2: 35 mEq/L — ABNORMAL HIGH (ref 19–32)
Calcium: 9.7 mg/dL (ref 8.4–10.5)
Chloride: 99 mEq/L (ref 96–112)
Creat: 0.93 mg/dL (ref 0.50–1.10)
GFR, Est African American: 75 mL/min
GFR, Est Non African American: 65 mL/min
Glucose, Bld: 103 mg/dL — ABNORMAL HIGH (ref 70–99)
Potassium: 4.2 mEq/L (ref 3.5–5.3)
Sodium: 142 mEq/L (ref 135–145)

## 2013-09-08 LAB — INSULIN, FASTING: Insulin fasting, serum: 26 u[IU]/mL (ref 3–28)

## 2013-09-08 LAB — MAGNESIUM: Magnesium: 2 mg/dL (ref 1.5–2.5)

## 2013-09-08 LAB — TSH: TSH: 0.889 u[IU]/mL (ref 0.350–4.500)

## 2013-09-13 ENCOUNTER — Ambulatory Visit (INDEPENDENT_AMBULATORY_CARE_PROVIDER_SITE_OTHER): Payer: Medicare Other | Admitting: Neurology

## 2013-09-13 ENCOUNTER — Encounter: Payer: Self-pay | Admitting: Neurology

## 2013-09-13 VITALS — BP 125/87 | HR 87 | Temp 97.6°F | Ht 63.0 in | Wt 185.0 lb

## 2013-09-13 DIAGNOSIS — R269 Unspecified abnormalities of gait and mobility: Secondary | ICD-10-CM

## 2013-09-13 DIAGNOSIS — G4733 Obstructive sleep apnea (adult) (pediatric): Secondary | ICD-10-CM

## 2013-09-13 DIAGNOSIS — R251 Tremor, unspecified: Secondary | ICD-10-CM

## 2013-09-13 DIAGNOSIS — G459 Transient cerebral ischemic attack, unspecified: Secondary | ICD-10-CM

## 2013-09-13 DIAGNOSIS — R259 Unspecified abnormal involuntary movements: Secondary | ICD-10-CM

## 2013-09-13 DIAGNOSIS — G510 Bell's palsy: Secondary | ICD-10-CM

## 2013-09-13 NOTE — Patient Instructions (Signed)
Continue exercising and your medications as directed. As discussed, secondary prevention is key after a stroke. This means: taking care of blood sugar values or diabetes management, good blood pressure (hypertension) control and optimizing cholesterol management, exercising daily or regularly within your own mobility limitations of course and overall cardiovascular risk factor reduction, which includes screening for and treatment of obstructive sleep apnea (OSA).

## 2013-09-13 NOTE — Progress Notes (Signed)
Subjective:    Patient ID: Crystal [name] is a 63 y.o. female.  HPI    Interim history:   Crystal [name] is a very pleasant 63 year old right-handed woman with a complex underlying medical history of hypertension, hyperlipidemia, Crohn's disease, hypothyroidism, obstructive sleep apnea, right-sided TIA in March 2005 (left-sided weakness and numbness in the face lasting 40 minutes), depression, and a history of right Bell's palsy, chronic back pain, fibromyalgia, RLS, interstitial cystitis and cervical and lumbar degenerative disc disease (s/p spine surgery of the lumbar spine under Dr. Vertell Limber), who presents for FU consultation of her gait disturbance and tremors. She is unaccompanied today. Last saw her on 04/28/2013, at which time I felt her physical exam was stable and she was advised to get started on baby aspirin for possible recent TIA with transient right eye vision loss. I requested echocardiogram, carotid Doppler study and repeat brain MRI and referred her to ENT. She was encouraged to discuss her stress and depression symptoms with her primary care physician. She was also encouraged to restart psychotherapy. We talked about secondary stroke prevention and her multifactorial gait disorder. Her carotid Doppler study from 05/24/2013 was unremarkable. Her brain MRI without contrast from 05/11/2013 was reported as normal without change from January 2014. In addition I personally reviewed the images through the PACS system. Her echocardiogram from 05/16/2013 showed an EF of 50-55%, mild mitral regurgitation, trivial tricuspid regurgitation, mild left atrial dilatation, and otherwise normal findings. We called her with all her test results.   Today, she reports, that she did not see ENT. She missed an appointment with Dr. Constance Holster in 11/14, but was not aware of the appointment. She had a fall and had hip pain on the R and had an X ray. She has had no further TIA. Her gait is stable. She has pain in her R  shoulder and hands.   I first met her on 10/26/2012, and which time I felt she had tremors as well as a multifactorial gait disorder and I suggested no new medications at the time but reinforced the need for treatment of her sleep apnea and we talked about general cardiovascular risk reduction. In 9/14, she had sudden onset of R eye vision loss for a about 40 minutes. She saw an eye doctor, and was told she may have glaucoma, and she has astigmatism. She has had orthostatic hypotension before. She endorsed stress and lost her son in 09-03-10, her mother died in 09-04-2011, and her father died on 04-25-2013. She was tearful. She had counseling in the past. She had PT per ortho and per PCP. She has had vertigo.  She has previously been seen by Dr. Erling Cruz and was last seen by him on 07/05/2012 for her history of balance difficulties. At the time of her last visit he suggested a repeat MRI and MRA of the brain. MRI and MRA of the brain did not show any acute or new abnormalities and changes were not much progressed from 2003/09/04.  She has had bilateral hand tremors and a family history of tremors and degenerative disc disease as well as obstructive sleep apnea on BiPAP and was on CPAP in the past, and is status post left knee surgery in September 03, 2008.  She fell in March 3/14 and went to [facility] and was Dx with food poisoning, and she had L leg pain and had X ray of the L leg. She has a history of dream enactments and was told that she has PLMs. She uses a  nasal mask, but her residual R facial weakness makes it hard for her to use the mask and she is not closing her eye. She has been on ASA 81 mg since her last presumed TIA in 12/13. She has ongoing problems with her L shoulder and had 2 surgeries on it.   Her Past Medical History Is Significant For: Past Medical History  Diagnosis Date  . Restless leg syndrome   . OSA (obstructive sleep apnea)   . Fibromyalgia   . Crohn's   . GERD (gastroesophageal reflux disease)   . Rhinitis  06/25/2010  . Gait disorder 10/26/2012  . Hypertension   . Hyperlipidemia   . Elevated hemoglobin A1c   . Thyroid disease   . Hypothyroid   . Depression   . Vitamin D deficiency   . ASCVD (arteriosclerotic cardiovascular disease)    Her Past Surgical History Is Significant For: Past Surgical History  Procedure Laterality Date  . Tonsillectomy    . Knee susrgery    . Shoulder adhesion release    . Spine surgery    . Appendectomy  1960  . Tonsillectomy  1960  . Cesarean section  [place]  . Abdominal hysterectomy  1992  . Rotator cuff repair Left 2005   Her Family History Is Significant For: Family History  Problem Relation Age of Onset  . Cancer Sister     uterus  . Depression Sister   . Heart disease Father   . Hypertension Father   . Hyperlipidemia Father   . Cancer Mother   . Diabetes Mother   . Osteoporosis Mother   . Leukemia Mother   . Leukemia Sister   . Lupus Sister     Her Social History Is Significant For: History   Social History  . Marital Status: Married    Spouse Name: N/A    Number of Children: 2  . Years of Education: N/A   Occupational History  . Retired Education officer, museum    Social History Main Topics  . Smoking status: Never Smoker   . Smokeless tobacco: None  . Alcohol Use: Yes     Comment: rarely  . Drug Use: None  . Sexual Activity: None   Other Topics Concern  . None   Social History Narrative  . None    Her Allergies Are:  Allergies  Allergen Reactions  . Amitriptyline   . Atorvastatin   . Bio-Flax   . Codeine   . Erythromycin   . Fish Oil   . Flax Seed [Bio-Flax]   . Linzess [Linaclotide]   . Morphine   . Nsaids   . Nuvigil [Armodafinil]   . Pravastatin   . Sinemet [Carbidopa W-Levodopa]   . Statins   . Sulfonamide Derivatives   . Zoloft [Sertraline Hcl]   :   Her Current Medications Are:  Outpatient Encounter Prescriptions as of 09/13/2013  Medication Sig  . acyclovir (ZOVIRAX) 800 MG tablet Take 800 mg  by mouth 3 (three) times daily.   [name] aspirin (ASPIRIN CHILDRENS) 81 MG chewable tablet Chew 1 tablet (81 mg total) by mouth daily.  . benazepril (LOTENSIN) 20 MG tablet Take 20 mg by mouth daily.    . bisoprolol-hydrochlorothiazide (ZIAC) 10-6.25 MG per tablet Take 1 tablet by mouth daily.    [name] dexlansoprazole (DEXILANT) 60 MG capsule Take 60 mg by mouth daily.  . DULoxetine (CYMBALTA) 60 MG capsule Take 1 capsule (60 mg total) by mouth daily.  . ergocalciferol (VITAMIN D2) 50000  UNITS capsule Take 50,000 Units by mouth 3 (three) times a week.   . fenofibrate micronized (LOFIBRA) 134 MG capsule Take 134 mg by mouth daily before breakfast.    . gabapentin (NEURONTIN) 800 MG tablet Take one tablet by mouth four times daily  . levothyroxine (SYNTHROID, LEVOTHROID) 112 MCG tablet Take 1 tablet (112 mcg total) by mouth daily before breakfast.  . LORazepam (ATIVAN) 2 MG tablet Take 1 tablet (2 mg total) by mouth at bedtime as needed.  . metaxalone (SKELAXIN) 800 MG tablet Take 1 tablet (800 mg total) by mouth 3 (three) times daily.  . pilocarpine (SALAGEN) 5 MG tablet Take 1 tablet (5 mg total) by mouth daily.  . ranitidine (ZANTAC) 150 MG tablet Take 1 tablet (150 mg total) by mouth at bedtime.  [name] rOPINIRole (REQUIP) 4 MG tablet Take 4 mg by mouth at bedtime.    . traMADol (ULTRAM) 50 MG tablet Take 50 mg by mouth every 6 (six) hours as needed.    . Triamcinolone Acetonide (NASACORT ALLERGY 24HR NA) Place 1 spray into the nose 2 (two) times daily.  . clonazePAM (KLONOPIN) 2 MG tablet Take 1 tablet (2 mg total) by mouth 2 (two) times daily as needed for anxiety.  [name] oxybutynin (DITROPAN) 5 MG tablet Take 5 mg by mouth 3 (three) times daily.   Review of Systems:  Out of a complete 14 point review of systems, all are reviewed and negative with the exception of these symptoms as listed below:   Review of Systems  Constitutional: Positive for diaphoresis and fatigue.  HENT: Positive for facial swelling.    Eyes: Negative.   Respiratory: Negative.   Cardiovascular: Negative.   Gastrointestinal: Positive for abdominal pain, constipation, blood in stool, abdominal distention and rectal pain.  Endocrine: Positive for polydipsia.  Genitourinary: Positive for frequency.  Musculoskeletal: Positive for arthralgias, back pain and neck stiffness.  Skin: Negative.   Allergic/Immunologic: Negative.   Neurological: Positive for dizziness, tremors, facial asymmetry and headaches.  Hematological: Negative.   Psychiatric/Behavioral: The patient is nervous/anxious.     Objective:  Neurologic Exam  Physical Exam Physical Examination:   Filed Vitals:   09/13/13 1528  BP: 125/87  Pulse: 87  Temp: 97.6 F (36.4 C)   General Examination: The patient is a very pleasant 63 y.o. female in no acute distress. She appears well-developed and well-nourished and well groomed. She is overweight.   HEENT: Normocephalic, atraumatic, pupils are equal, round and reactive to light and accommodation. Funduscopy is normal b/l. Extraocular tracking is good without limitation to gaze excursion or nystagmus noted. Normal smooth pursuit is noted. Hearing is grossly intact. Tympanic membranes are clear bilaterally. Face is asymmetric with R facial palsy, unchaged. Speech is clear with no dysarthria noted. There is no hypophonia. There is no lip, neck or jaw tremor. Neck is supple with full range of motion. There are no carotid bruits on auscultation. Oropharynx exam reveals: adequate dental hygiene and moderate airway crowding, due to redundant soft palate. Mallampati is class II. Tongue protrudes centrally and palate elevates symmetrically. There is moderate mouth dryness.   Chest: Clear to auscultation without wheezing, rhonchi or crackles noted.  Heart: S1+S2+0, regular and normal without murmurs, rubs or gallops noted.   Abdomen: Soft, non-tender and non-distended with normal bowel sounds appreciated on  auscultation.  Extremities: There is no pitting edema in the distal lower extremities bilaterally. Pedal pulses are intact.  Skin: Warm and dry without trophic changes noted. There are no  varicose veins. Mild eczematous changes noted on the L dorsal hand.   Musculoskeletal: exam reveals no obvious joint deformities, tenderness or joint swelling or erythema, but changes c/w arthritis in her hands are noted.    Neurologically:  Mental status: The patient is awake, alert and oriented in all 4 spheres. Her memory, attention, language and knowledge are appropriate. There is no aphasia, agnosia, apraxia or anomia. Speech is clear with normal prosody and enunciation. Thought process is linear. Mood is congruent and affect is normal.  Cranial nerves are as described above under HEENT exam. In addition, shoulder shrug is normal with equal shoulder height noted. Motor exam: Normal bulk, strength and tone is noted. There is no drift, or rebound. She has a very mild UE postural and action tremor, no resting tremor. Romberg is showing swaying. Reflexes are 1+ throughout. Fine motor skills are fairly well intact with normal finger taps, normal hand movements, normal rapid alternating patting, normal foot taps and normal foot agility.   Cerebellar testing shows no dysmetria or intention tremor on finger to nose testing. Heel to shin is unremarkable bilaterally. There is no truncal or gait ataxia.   Sensory exam is intact to light touch, pinprick, vibration, temperature sense in the upper and lower extremities.  Gait, station and balance: cautious gait. No leaning to one side is noted. Posture is age-appropriate and stance is narrow based. No problems turning are noted. She has some trouble with tandem walk, but better than last time.   Assessment and Plan:   In summary, Crystal [name] is a very pleasant 63 year old female with a history of gait disorder and tremor, OSA on BiPAP and TIA in the past, and a  possible recent TIA with transient R eye vision loss. Her physical exam is stable. She is advised to continue baby ASA and we completed a TIA w/u, which was unremarkable. We discussed all her test results today. She is still on Cymbalta. She could not tolerate Lyrica.   I had a long chat with the patient about my findings and the diagnosis of gait disorder and TIA. We talked about maintaining a healthy lifestyle in general and secondary stroke preventive. I encouraged the patient to eat healthy, exercise daily and keep well hydrated, to keep a scheduled bedtime and wake time routine, to not skip any meals and eat healthy snacks in between meals and to have protein with every meal.  As far as medications are concerned, I recommended the following at this time: restart baby aspirin. She is on BiPAP therapy. She has a sleep doctor but has not seen him. She does not know his name. She does not know who her DME company as. She was under the impression that I follow her for sleep apnea but I encouraged her to followup with the original sleep doctor. If not possible I would like for her to get records on her sleep study results and BiPAP machine orders so I can look at back. If she follows with me for sleep apnea we will have to get records. Otherwise, I can see her back on an as-needed basis at this point. She was advised to continue BiPAP regularly each night.

## 2013-09-15 DIAGNOSIS — M12819 Other specific arthropathies, not elsewhere classified, unspecified shoulder: Secondary | ICD-10-CM | POA: Insufficient documentation

## 2013-09-29 NOTE — Telephone Encounter (Signed)
Closing encounter

## 2013-10-21 ENCOUNTER — Telehealth: Payer: Self-pay

## 2013-10-21 NOTE — Telephone Encounter (Signed)
Pt called requesting lab results be faxed to Dr. Corliss Skains. Labs were faxed to [phone]. Pt aware.

## 2013-10-23 ENCOUNTER — Other Ambulatory Visit: Payer: Self-pay | Admitting: Physician Assistant

## 2013-10-25 ENCOUNTER — Encounter: Payer: Self-pay | Admitting: Internal Medicine

## 2013-10-25 ENCOUNTER — Ambulatory Visit (INDEPENDENT_AMBULATORY_CARE_PROVIDER_SITE_OTHER): Payer: Medicare Other | Admitting: Internal Medicine

## 2013-10-25 VITALS — BP 100/62 | HR 88 | Temp 97.7°F | Resp 16 | Ht 64.0 in | Wt 182.6 lb

## 2013-10-25 DIAGNOSIS — B351 Tinea unguium: Secondary | ICD-10-CM

## 2013-10-25 DIAGNOSIS — Z79899 Other long term (current) drug therapy: Secondary | ICD-10-CM

## 2013-10-25 DIAGNOSIS — G894 Chronic pain syndrome: Secondary | ICD-10-CM

## 2013-10-25 DIAGNOSIS — M25519 Pain in unspecified shoulder: Secondary | ICD-10-CM

## 2013-10-25 MED ORDER — PILOCARPINE HCL 5 MG PO TABS: 5.0000 mg | ORAL_TABLET | Freq: Two times a day (BID) | ORAL | Status: DC

## 2013-10-25 MED ORDER — ERGOCALCIFEROL 1.25 MG (50000 UT) PO CAPS: ORAL_CAPSULE | ORAL | Status: DC

## 2013-10-25 MED ORDER — GABAPENTIN 400 MG PO CAPS: 400.0000 mg | ORAL_CAPSULE | Freq: Three times a day (TID) | ORAL | Status: DC

## 2013-10-25 NOTE — Progress Notes (Deleted)
Patient ID: Crystal [name], female   DOB: 08-26-1950, 63 y.o.   MRN: [id number]    This very nice 63 y.o. female presents for 3 month follow up with Hypertension, Hyperlipidemia, Pre-Diabetes and Vitamin D Deficiency. Other problems include Crohn's Dz, Fibromyalgia, GERD, Depression, Hypothyroidism and OSA/RLS    HTN predates since   . BP has been controlled at home. Today's   . Patient denies any cardiac type chest pain, palpitations, dyspnea/orthopnea/PND, dizziness, claudication, or dependent edema.   Hyperlipidemia is controlled with diet & meds. Last Cholesterol was 194, Triglycerides were 143, HDL 42 and LDL 123 in Mar [id number]. Patient denies myalgias or other med SE's.    Also, the patient has history of PreDiabetes/insulin resistance since     with last A1c of 6.0% in Mar 2015.[name]. Patient denies any symptoms of reactive hypoglycemia, diabetic polys, paresthesias or visual blurring.   Further, Patient has history of Vitamin D Deficiency with last vitamin D of 109 in mar 2015. Patient supplements vitamin D without any suspected side-effects.  Current Outpatient Prescriptions on File Prior to Visit  Medication Sig Dispense Refill  . acyclovir (ZOVIRAX) 800 MG tablet Take 800 mg by mouth 3 (three) times daily.       [name]. aspirin (ASPIRIN CHILDRENS) 81 MG chewable tablet Chew 1 tablet (81 mg total) by mouth daily.      . benazepril (LOTENSIN) 20 MG tablet Take 20 mg by mouth daily.        . bisoprolol-hydrochlorothiazide (ZIAC) 10-6.25 MG per tablet Take 1 tablet by mouth daily.        . clonazePAM (KLONOPIN) 2 MG tablet Take 1 tablet (2 mg total) by mouth 2 (two) times daily as needed for anxiety.  60 tablet  0  . dexlansoprazole (DEXILANT) 60 MG capsule Take 60 mg by mouth daily.      . DULoxetine (CYMBALTA) 60 MG capsule Take 1 capsule (60 mg total) by mouth daily.  90 capsule  1  . ergocalciferol (VITAMIN D2) 50000 UNITS capsule Take 50,000 Units by mouth 3 (three) times a week.       .  fenofibrate micronized (LOFIBRA) 134 MG capsule Take 134 mg by mouth daily before breakfast.        . gabapentin (NEURONTIN) 800 MG tablet Take one tablet by mouth four times daily  120 tablet  1  . levothyroxine (SYNTHROID, LEVOTHROID) 112 MCG tablet Take 1 tablet (112 mcg total) by mouth daily before breakfast.  90 tablet  1  . LORazepam (ATIVAN) 2 MG tablet Take 1 tablet (2 mg total) by mouth at bedtime as needed.  30 tablet  3  . metaxalone (SKELAXIN) 800 MG tablet Take 1 tablet (800 mg total) by mouth 3 (three) times daily.  270 tablet  99  . oxybutynin (DITROPAN) 5 MG tablet Take 5 mg by mouth 3 (three) times daily.      . pilocarpine (SALAGEN) 5 MG tablet TAKE ONE TABLET BY MOUTH ONE TIME DAILY   30 tablet  1  . ranitidine (ZANTAC) 150 MG tablet Take 1 tablet (150 mg total) by mouth at bedtime.  90 tablet  3  . rOPINIRole (REQUIP) 4 MG tablet Take 4 mg by mouth at bedtime.        . traMADol (ULTRAM) 50 MG tablet Take 50 mg by mouth every 6 (six) hours as needed.        . Triamcinolone Acetonide (NASACORT ALLERGY 24HR NA) Place 1 spray into the nose 2 (  two) times daily.       No current facility-administered medications on file prior to visit.     Allergies  Allergen Reactions  . Amitriptyline   . Atorvastatin   . Bio-Flax   . Codeine   . Erythromycin   . Fish Oil   . Flax Seed [Bio-Flax]   . Linzess [Linaclotide]   . Morphine   . Nsaids   . Nuvigil [Armodafinil]   . Pravastatin   . Sinemet [Carbidopa W-Levodopa]   . Statins   . Sulfonamide Derivatives   . Zoloft [Sertraline Hcl]     PMHx:   Past Medical History  Diagnosis Date  . Restless leg syndrome   . OSA (obstructive sleep apnea)   . Fibromyalgia   . Crohn's   . GERD (gastroesophageal reflux disease)   . Rhinitis 06/25/2010  . Gait disorder 10/26/2012  . Hypertension   . Hyperlipidemia   . Elevated hemoglobin A1c   . Thyroid disease   . Hypothyroid   . Depression   . Vitamin D deficiency   . ASCVD  (arteriosclerotic cardiovascular disease)     FHx:    Reviewed / unchanged  SHx:    Reviewed / unchanged   Systems Review: Constitutional: Denies fever, chills, wt changes, headaches, insomnia, fatigue, night sweats, change in appetite. Eyes: Denies redness, blurred vision, diplopia, discharge, itchy, watery eyes.  ENT: Denies discharge, congestion, post nasal drip, epistaxis, sore throat, earache, hearing loss, dental pain, tinnitus, vertigo, sinus pain, snoring.  CV: Denies chest pain, palpitations, irregular heartbeat, syncope, dyspnea, diaphoresis, orthopnea, PND, claudication, edema. Respiratory: denies cough, dyspnea, DOE, pleurisy, hoarseness, laryngitis, wheezing.  Gastrointestinal: Denies dysphagia, odynophagia, heartburn, reflux, water brash, abdominal pain or cramps, nausea, vomiting, bloating, diarrhea, constipation, hematemesis, melena, hematochezia,  or hemorrhoids. Genitourinary: Denies dysuria, frequency, urgency, nocturia, hesitancy, discharge, hematuria, flank pain. Musculoskeletal: Denies arthralgias, myalgias, stiffness, jt. swelling, pain, limp, strain/sprain.  Skin: Denies pruritus, rash, hives, warts, acne, eczema, change in skin lesion(s). Neuro: No weakness, tremor, incoordination, spasms, paresthesia, or pain. Psychiatric: Denies confusion, memory loss, or sensory loss. Endo: Denies change in weight, skin, hair change.  Heme/Lymph: No excessive bleeding, bruising, orenlarged lymph nodes.   Exam:  There were no vitals taken for this visit.  Appears well nourished - in no distress. Eyes: PERRLA, EOMs, conjunctiva no swelling or erythema. Sinuses: No frontal/maxillary tenderness ENT/Mouth: EAC's clear, TM's nl w/o erythema, bulging. Nares clear w/o erythema, swelling, exudates. Oropharynx clear without erythema or exudates. Oral hygiene is good. Tongue normal, non obstructing. Hearing intact.  Neck: Supple. Thyroid nl. Car 2+/2+ without bruits, nodes or  JVD. Chest: Respirations nl with BS clear & equal w/o rales, rhonchi, wheezing or stridor.  Cor: Heart sounds normal w/ regular rate and rhythm without sig. murmurs, gallops, clicks, or rubs. Peripheral pulses normal and equal  without edema.  Abdomen: Soft & bowel sounds normal. Non-tender w/o guarding, rebound, hernias, masses, or organomegaly.  Lymphatics: Unremarkable.  Musculoskeletal: Full ROM all peripheral extremities, joint stability, 5/5 strength, and normal gait.  Skin: Warm, dry without exposed rashes, lesions, ecchymosis apparent.  Neuro: Cranial nerves intact, reflexes equal bilaterally. Sensory-motor testing grossly intact. Tendon reflexes grossly intact.  Pysch: Alert & oriented x 3. Insight and judgement nl & appropriate. No ideations.  Assessment and Plan:  1. Hypertension - Continue monitor blood pressure at home. Continue diet/meds same.  2. Hyperlipidemia - Continue diet/meds, exercise,& lifestyle modifications. Continue monitor periodic cholesterol/liver & renal functions   3. Pre-diabetes/Insulin Resistance - Continue  diet, exercise, lifestyle modifications. Monitor appropriate labs.  4. Vitamin D Deficiency - Continue supplementation.  Recommended regular exercise, BP monitoring, weight control, and discussed med and SE's. Recommended labs to assess and monitor clinical status. Further disposition pending results of labs.

## 2013-10-25 NOTE — Patient Instructions (Addendum)
   Shoulder Pain The shoulder is the joint that connects your arms to your body. The bones that form the shoulder joint include the upper arm bone (humerus), the shoulder blade (scapula), and the collarbone (clavicle). The top of the humerus is shaped like a ball and fits into a rather flat socket on the scapula (glenoid cavity). A combination of muscles and strong, fibrous tissues that connect muscles to bones (tendons) support your shoulder joint and hold the ball in the socket. Small, fluid-filled sacs (bursae) are located in different areas of the joint. They act as cushions between the bones and the overlying soft tissues and help reduce friction between the gliding tendons and the bone as you move your arm. Your shoulder joint allows a wide range of motion in your arm. This range of motion allows you to do things like scratch your back or throw a ball. However, this range of motion also makes your shoulder more prone to pain from overuse and injury. Causes of shoulder pain can originate from both injury and overuse and usually can be grouped in the following four categories:  Redness, swelling, and pain (inflammation) of the tendon (tendinitis) or the bursae (bursitis).  Instability, such as a dislocation of the joint.  Inflammation of the joint (arthritis).  Broken bone (fracture). HOME CARE INSTRUCTIONS   Apply ice to the sore area.  Put ice in a plastic bag.  Place a towel between your skin and the bag.  Leave the ice on for 15-20 minutes, 03-04 times per day for the first 2 days.  Stop using cold packs if they do not help with the pain.  If you have a shoulder sling or immobilizer, wear it as long as your caregiver instructs. Only remove it to shower or bathe. Move your arm as little as possible, but keep your hand moving to prevent swelling.  Squeeze a soft ball or foam pad as much as possible to help prevent swelling.  Only take over-the-counter or prescription medicines for  pain, discomfort, or fever as directed by your caregiver. SEEK MEDICAL CARE IF:   Your shoulder pain increases, or new pain develops in your arm, hand, or fingers.  Your hand or fingers become cold and numb.  Your pain is not relieved with medicines. SEEK IMMEDIATE MEDICAL CARE IF:   Your arm, hand, or fingers are numb or tingling.  Your arm, hand, or fingers are significantly swollen or turn white or blue. MAKE SURE YOU:   Understand these instructions.  Will watch your condition.  Will get help right away if you are not doing well or get worse. Document Released: 03/26/2005 Document Revised: 03/10/2012 Document Reviewed: 05/31/2011 [employer] Patient Information 2014 [employer].  

## 2013-10-25 NOTE — Progress Notes (Signed)
Subjective:    Patient ID: Crystal [name], female    DOB: April 23, 1951, 63 y.o.   MRN: [id number]  HPI Patient presents today with c/o neck and LBP for which she has seen Dr Venetia Maxon. She also relates she was seen by Dr Selmer Dominion at [facility] for left shoulder pains and was referred to Dr Lamona Curl for evaluation of bilat. shoulder pain and received steroid injections to both shoulders. She reports improvement in her shoulder pains with  Increased ROM. She now requests referral to a local pain management clinic for management of he pain meds and for further steroid injections if needed. She also admits excessive sleepiness throughout the day which she speculates may be medication related. Lastly she note failure on Lamisil for cure of her toenail fungus and would like to see a podiatrist.    Medication List       This list is accurate as of: 10/25/13  7:01 PM.  Always use your most recent med list.               acyclovir 800 MG tablet  Take 800 mg by mouth 3 (three) times daily.     aspirin 81 MG chewable tablet  Chew 1 tablet (81 mg total) by mouth daily.     benazepril 20 MG tablet  Take 20 mg by mouth daily.     bisoprolol-hydrochlorothiazide 10-6.25 MG per tablet ZIAC  Take 1 tablet by mouth daily.     DEXILANT 60 MG capsule  Take 60 mg by mouth daily.     DULoxetine 60 MG capsule  Take 1 capsule (60 mg total) by mouth daily.     ergocalciferol 50000 UNITS capsule  Take 1 capsule 2 x week (Mon & Fri)     fenofibrate micronized 134 MG capsule LOFIBRA  Take 134 mg by mouth daily before breakfast.     gabapentin 400 MG capsule NEURONTIN  Take 1 capsule (400 mg total) by mouth 3 (three) times daily. For pain     levothyroxine 112 MCG tablet  Take 1 tablet (112 mcg total) by mouth daily before breakfast.     LORazepam 2 MG tablet  Take 1 tablet (2 mg total) by mouth at bedtime as needed.     metaxalone 800 MG tablet  SKELAXIN  Take 1 tablet (800 mg total) by mouth 3 (three)  times daily.     NASACORT ALLERGY 24HR NA  Place 1 spray into the nose 2 (two) times daily.     oxybutynin 5 MG tablet  DITROPAN  Take 5 mg by mouth 3 (three) times daily.     pilocarpine 5 MG tablet  SALAGEN  Take 1 tablet (5 mg total) by mouth 2 (two) times daily. For dry mouth     ranitidine 150 MG tablet  Take 1 tablet (150 mg total) by mouth at bedtime.     rOPINIRole 4 MG tablet  REQUIP  Take 4 mg by mouth at bedtime.     traMADol 50 MG tablet  Take 50 mg by mouth every 6 (six) hours as needed.       Allergies  Allergen Reactions  . Amitriptyline   . Atorvastatin   . Bio-Flax   . Codeine   . Erythromycin   . Fish Oil   . Flax Seed [Bio-Flax]   . Linzess [Linaclotide]   . Morphine   . Nsaids   . Nuvigil [Armodafinil]   . Pravastatin   . Sinemet [Carbidopa W-Levodopa]   .  Statins   . Sulfonamide Derivatives   . Zoloft [Sertraline Hcl]    Past Medical History  Diagnosis Date  . Restless leg syndrome   . OSA (obstructive sleep apnea)   . Fibromyalgia   . Crohn's   . GERD (gastroesophageal reflux disease)   . Rhinitis 06/25/2010  . Gait disorder 10/26/2012  . Hypertension   . Hyperlipidemia   . Elevated hemoglobin A1c   . Thyroid disease   . Hypothyroid   . Depression   . Vitamin D deficiency   . ASCVD (arteriosclerotic cardiovascular disease)    Review of Systems Focused as above   and  c/o dysuria Objective:   Physical Exam  BP 100/62  Pulse 88  Temp(Src) 97.7 F (36.5 C) (Temporal)  Resp 16  Ht 5\' 4"  (1.626 m)  Wt 182 lb 9.6 oz (82.827 kg)  BMI 31.33 kg/m2  Exam focused on bilat UE shows decreased shoulder ROM secondary to pain with generally normal sensory motor testing  Assessment & Plan:   1. Pain in joint, shoulder region  - Ambulatory referral to Pain Clinic  2. Chronic pain syndrome  - gabapentin (NEURONTIN) 400 MG capsule; Take 1 capsule (400 mg total) by mouth 3 (three) times daily. (Tapering dose from 2400 mg to 1200 mg  daiy suspecting it is contributory to her excessive sleepiness.)  3. Encounter for long-term (current) use of other medications  - Urine Microscopic  4. Dermatophytosis of nail  - Ambulatory referral to Podiatry

## 2013-10-26 LAB — URINALYSIS, MICROSCOPIC ONLY

## 2013-10-28 ENCOUNTER — Other Ambulatory Visit: Payer: Self-pay | Admitting: Physician Assistant

## 2013-11-16 ENCOUNTER — Encounter: Payer: Self-pay | Admitting: Podiatry

## 2013-11-16 ENCOUNTER — Ambulatory Visit (INDEPENDENT_AMBULATORY_CARE_PROVIDER_SITE_OTHER): Payer: Medicare Other | Admitting: Podiatry

## 2013-11-16 ENCOUNTER — Ambulatory Visit (INDEPENDENT_AMBULATORY_CARE_PROVIDER_SITE_OTHER): Payer: Medicare Other

## 2013-11-16 DIAGNOSIS — M2040 Other hammer toe(s) (acquired), unspecified foot: Secondary | ICD-10-CM

## 2013-11-16 DIAGNOSIS — R52 Pain, unspecified: Secondary | ICD-10-CM

## 2013-11-16 DIAGNOSIS — M7750 Other enthesopathy of unspecified foot: Secondary | ICD-10-CM

## 2013-11-16 DIAGNOSIS — B351 Tinea unguium: Secondary | ICD-10-CM

## 2013-11-16 NOTE — Progress Notes (Signed)
Subjective:     Patient ID: Crystal [name], female   DOB: [birth year], 63 y.o.   MRN: [id number]  Foot Pain   patient presents with pain in the lesser digits right foot with elevation of the second third and elongation of the second and third toes right and previous surgery on both feet of digits and first metatarsal. Also complains about thick toenail right   Review of Systems  All other systems reviewed and are negative.      Objective:   Physical Exam  Nursing note and vitals reviewed. Constitutional: She is oriented to person, place, and time.  Cardiovascular: Intact distal pulses.   Musculoskeletal: Normal range of motion.  Neurological: She is oriented to person, place, and time.  Skin: Skin is warm.   neurovascular status found to be intact with range of motion adequate subtalar and midtarsal joint both feet with no motion first metatarsal left secondary to effusion and digital procedure second and third digit left with slight elevation but minimal discomfort and I noted on the right foot elevation of the second and third toes with redness on top of the interphalangeal joint and distal keratotic tissue third digit right. Also noted there to be thickness of the big toenail right secondary to trauma     Assessment:     Patient is in numerous surgeries done and has elongated second and third toes right foot that are moderately painful and is noted to have probable trauma mycotic nail right    Plan:     H&P and x-rays reviewed with patient. Today we discussed what we can and cannot accomplish in the best area I think I can help her would be the second and third toes right which would benefit from a fusion. Patient will be scheduled for this after she has her neck worked on by Dr. Vertell Limber in June

## 2013-11-16 NOTE — Progress Notes (Signed)
   Subjective:    Patient ID: Crystal [name], female    DOB: 10/10/1950, 63 y.o.   MRN: [id number]  HPI PT STATED BOTH TOP OF THE FEET ARE SORE SOMETIMES AND BURNING SENSATION FOR 3-4 YEARS. THE FEET GETTING WORSE. THE FEET GET AGGRAVATED BY WALKING. I TRIED PAIN MEDICATION CREAM.      Review of Systems  Constitutional: Positive for chills and fatigue.  HENT: Positive for sinus pressure.   Gastrointestinal: Positive for nausea, diarrhea and constipation.  Musculoskeletal: Positive for back pain, gait problem, joint swelling and myalgias.  Skin: Positive for color change.  Allergic/Immunologic: Positive for environmental allergies and food allergies.  Neurological: Positive for dizziness, tremors, weakness and headaches.  All other systems reviewed and are negative.      Objective:   Physical Exam        Assessment & Plan:

## 2013-11-22 ENCOUNTER — Other Ambulatory Visit: Payer: Self-pay | Admitting: Emergency Medicine

## 2013-11-23 ENCOUNTER — Telehealth: Payer: Self-pay | Admitting: *Deleted

## 2013-11-23 NOTE — Telephone Encounter (Signed)
Patient called and was given meds from pain management-Horizant and Neucynta.  Patient complained of sweats, dizziness, lack of focus, body aches, unsteady gait and sleep disturbance.  Patient stopped meds and returned to her previous pain medications and side effects subsided.  I called patient and she had received a call from pain management office.  They approved of her returning to previous meds until her next visit with them.

## 2013-12-07 ENCOUNTER — Ambulatory Visit: Payer: Self-pay | Admitting: Emergency Medicine

## 2013-12-08 ENCOUNTER — Ambulatory Visit (INDEPENDENT_AMBULATORY_CARE_PROVIDER_SITE_OTHER): Payer: Medicare Other | Admitting: Emergency Medicine

## 2013-12-08 ENCOUNTER — Encounter: Payer: Self-pay | Admitting: Emergency Medicine

## 2013-12-08 VITALS — BP 118/64 | HR 92 | Temp 98.0°F | Resp 18 | Ht 64.0 in | Wt 184.0 lb

## 2013-12-08 DIAGNOSIS — I10 Essential (primary) hypertension: Secondary | ICD-10-CM

## 2013-12-08 DIAGNOSIS — E039 Hypothyroidism, unspecified: Secondary | ICD-10-CM

## 2013-12-08 DIAGNOSIS — E782 Mixed hyperlipidemia: Secondary | ICD-10-CM

## 2013-12-08 DIAGNOSIS — D649 Anemia, unspecified: Secondary | ICD-10-CM

## 2013-12-08 DIAGNOSIS — R7309 Other abnormal glucose: Secondary | ICD-10-CM

## 2013-12-08 DIAGNOSIS — M2550 Pain in unspecified joint: Secondary | ICD-10-CM

## 2013-12-08 DIAGNOSIS — IMO0001 Reserved for inherently not codable concepts without codable children: Secondary | ICD-10-CM

## 2013-12-08 DIAGNOSIS — E538 Deficiency of other specified B group vitamins: Secondary | ICD-10-CM

## 2013-12-08 LAB — CBC WITH DIFFERENTIAL/PLATELET
Basophils Absolute: 0.1 10*3/uL (ref 0.0–0.1)
Basophils Relative: 1 % (ref 0–1)
Eosinophils Absolute: 0.3 10*3/uL (ref 0.0–0.7)
Eosinophils Relative: 4 % (ref 0–5)
HCT: 40.3 % (ref 36.0–46.0)
Hemoglobin: 14.1 g/dL (ref 12.0–15.0)
Lymphocytes Relative: 21 % (ref 12–46)
Lymphs Abs: 1.4 10*3/uL (ref 0.7–4.0)
MCH: 30.6 pg (ref 26.0–34.0)
MCHC: 35 g/dL (ref 30.0–36.0)
MCV: 87.4 fL (ref 78.0–100.0)
Monocytes Absolute: 0.7 10*3/uL (ref 0.1–1.0)
Monocytes Relative: 10 % (ref 3–12)
Neutro Abs: 4.2 10*3/uL (ref 1.7–7.7)
Neutrophils Relative %: 64 % (ref 43–77)
Platelets: 294 10*3/uL (ref 150–400)
RBC: 4.61 MIL/uL (ref 3.87–5.11)
RDW: 13.8 % (ref 11.5–15.5)
WBC: 6.5 10*3/uL (ref 4.0–10.5)

## 2013-12-08 NOTE — Progress Notes (Signed)
Subjective:    Patient ID: Crystal [name], female    DOB: [birth year], 63 y.o.   MRN: [id number]  HPI Comments: 63 yo WF presents for 3 month F/U for HTN, Cholesterol, Pre-Dm, D. Deficient. She is trying to improve exercise but has had difficulty with right thigh and neck and shoulder and hand arthritis/ myalgias. She is being followed by multiple orthopedics and neurologist for th. She has been trying to eat better.   ZAAZ machine helps with cholesterol/ balance/ pain and she has been researching the machine and wants to try to get insurance to cover cost. She did try machine of her cousin and noticed improvement with symptoms.  SHe has had increased stress and is feeling more depressed. She has lost her son, mother, father and cat over the last 3 years and feels she is not doing very well. She is crying more.   WBC             6.9   09/07/2013 HGB            14.4   09/07/2013 HCT            42.6   09/07/2013 PLT             293   09/07/2013 GLUCOSE         103   09/07/2013 CHOL            194   09/07/2013 TRIG            143   09/07/2013 HDL              42   09/07/2013 LDLCALC         123   09/07/2013 ALT              11   09/07/2013 AST              13   09/07/2013 NA              142   09/07/2013 K               4.2   09/07/2013 CL               99   09/07/2013 CREATININE     0.93   09/07/2013 BUN              16   09/07/2013 CO2              35   09/07/2013 TSH           0.889   09/07/2013 INR             1.0   12/14/2008 HGBA1C          6.0   09/07/2013 MICROALBUR     1.45   05/30/2013   Hyperlipidemia Associated symptoms include myalgias.  Hypertension     Medication List       This list is accurate as of: 12/08/13 12:16 PM.  Always use your most recent med list.               acyclovir 800 MG tablet  Commonly known as:  ZOVIRAX  Take 800 mg by mouth 3 (three) times daily.     aspirin 81 MG chewable tablet  Commonly known as:  ASPIRIN CHILDRENS  Chew 1 tablet (81 mg total)  by mouth daily.     benazepril 20 MG tablet  Commonly known as:  LOTENSIN  Take 20 mg by mouth daily.     bisoprolol-hydrochlorothiazide 10-6.25 MG per tablet  Commonly known as:  ZIAC  Take 1 tablet by mouth daily.     DEXILANT 60 MG capsule  Generic drug:  dexlansoprazole  Take 60 mg by mouth daily.     DULoxetine 60 MG capsule  Commonly known as:  CYMBALTA  TAKE ONE CAPSULE BY MOUTH ONE TIME DAILY     ergocalciferol 50000 UNITS capsule  Commonly known as:  VITAMIN D2  Take 1 capsule 2 x week (Mon & Fri)     fenofibrate micronized 134 MG capsule  Commonly known as:  LOFIBRA  Take 134 mg by mouth daily before breakfast.     gabapentin 800 MG tablet  Commonly known as:  NEURONTIN  Take 800 mg by mouth 3 (three) times daily.     levothyroxine 112 MCG tablet  Commonly known as:  SYNTHROID, LEVOTHROID  Take 1 tablet (112 mcg total) by mouth daily before breakfast.     LORazepam 2 MG tablet  Commonly known as:  ATIVAN  Take 1 tablet (2 mg total) by mouth at bedtime as needed.     metaxalone 800 MG tablet  Commonly known as:  SKELAXIN  Take 1 tablet (800 mg total) by mouth 3 (three) times daily.     NASACORT ALLERGY 24HR NA  Place 1 spray into the nose 2 (two) times daily.     oxybutynin 5 MG tablet  Commonly known as:  DITROPAN  Take 5 mg by mouth 3 (three) times daily.     pilocarpine 5 MG tablet  Commonly known as:  SALAGEN  Take 1 tablet (5 mg total) by mouth 2 (two) times daily. For dry mouth     ranitidine 150 MG tablet  Commonly known as:  ZANTAC  Take 1 tablet (150 mg total) by mouth at bedtime.     rOPINIRole 4 MG tablet  Commonly known as:  REQUIP  TAKE ONE TABLET BY MOUTH NIGHTLY AT BEDTIME for restless leg     traMADol 50 MG tablet  Commonly known as:  ULTRAM  Take 50 mg by mouth every 6 (six) hours as needed.       Allergies  Allergen Reactions  . Amitriptyline   . Atorvastatin   . Bio-Flax   . Codeine   . Erythromycin   . Fish Oil    . Flax Seed [Bio-Flax]   . Linzess [Linaclotide]   . Morphine   . Nsaids   . Nuvigil [Armodafinil]   . Pravastatin   . Sinemet [Carbidopa W-Levodopa]   . Statins   . Sulfonamide Derivatives   . Zoloft [Sertraline Hcl]    Past Medical History  Diagnosis Date  . Restless leg syndrome   . OSA (obstructive sleep apnea)   . Fibromyalgia   . Crohn's   . GERD (gastroesophageal reflux disease)   . Rhinitis 06/25/2010  . Gait disorder 10/26/2012  . Hypertension   . Hyperlipidemia   . Elevated hemoglobin A1c   . Thyroid disease   . Hypothyroid   . Depression   . Vitamin D deficiency   . ASCVD (arteriosclerotic cardiovascular disease)       Review of Systems  Constitutional: Positive for fatigue.  Musculoskeletal: Positive for arthralgias and myalgias.  Neurological: Positive for tremors.  Psychiatric/Behavioral: The patient is nervous/anxious.        Depressed  All other systems reviewed and are negative.  BP 118/64  Pulse 92  Temp(Src) 98 F (36.7 C) (Temporal)  Resp 18  Ht 5\' 4"  (1.626 m)  Wt 184 lb (83.462 kg)  BMI 31.57 kg/m2     Objective:   Physical Exam  Nursing note and vitals reviewed. Constitutional: She is oriented to person, place, and time. She appears well-developed and well-nourished. No distress.  HENT:  Head: Normocephalic and atraumatic.  Right Ear: External ear normal.  Left Ear: External ear normal.  Nose: Nose normal.  Mouth/Throat: Oropharynx is clear and moist.  Eyes: Conjunctivae and EOM are normal.  Neck: Normal range of motion. Neck supple. No JVD present. No thyromegaly present.  Cardiovascular: Normal rate, regular rhythm, normal heart sounds and intact distal pulses.   Pulmonary/Chest: Effort normal and breath sounds normal.  Abdominal: Soft. Bowel sounds are normal. She exhibits no distension and no mass. There is no tenderness. There is no rebound and no guarding.  Musculoskeletal: Normal range of motion. She exhibits no edema and  no tenderness.  + crepitus bilateral  Lymphadenopathy:    She has no cervical adenopathy.  Neurological: She is alert and oriented to person, place, and time. No cranial nerve deficit.  + tremor of both legs with sitting resting, otherwise no change with left facial droop  Skin: Skin is warm and dry. No rash noted. No erythema. No pallor.  Psychiatric: She has a normal mood and affect. Her behavior is normal. Judgment and thought content normal.  + tearful but appropriate          Assessment & Plan:  1.  3 month F/U for HTN, Cholesterol, Pre-Dm, D. Deficient. Needs healthy diet, cardio QD and obtain healthy weight. Check Labs, Check BP if >130/80 call office   2. Arthralgia/ Myalgias with chronic degenerative changes- will research ZAAZ machine and consider letter to insurance, advise needs same letter from [facility] and other specialists she sees.  3. Fatigue vs depression- check labs, increase activity and H2O, advise restart [facility], will consider medication change if no improvement, increase green leafy veggies.  OVER 40 minutes of exam, counseling, chart review, referral performed

## 2013-12-09 LAB — LIPID PANEL
Cholesterol: 174 mg/dL (ref 0–200)
HDL: 38 mg/dL — ABNORMAL LOW (ref >39)
LDL Cholesterol: 85 mg/dL (ref 0–99)
Total CHOL/HDL Ratio: 4.6 Ratio
Triglycerides: 255 mg/dL — ABNORMAL HIGH (ref <150)
VLDL: 51 mg/dL — ABNORMAL HIGH (ref 0–40)

## 2013-12-09 LAB — BASIC METABOLIC PANEL WITH GFR
BUN: 21 mg/dL (ref 6–23)
CO2: 31 mEq/L (ref 19–32)
Calcium: 9.9 mg/dL (ref 8.4–10.5)
Chloride: 102 mEq/L (ref 96–112)
Creat: 0.85 mg/dL (ref 0.50–1.10)
GFR, Est African American: 83 mL/min
GFR, Est Non African American: 72 mL/min
Glucose, Bld: 104 mg/dL — ABNORMAL HIGH (ref 70–99)
Potassium: 4 mEq/L (ref 3.5–5.3)
Sodium: 142 mEq/L (ref 135–145)

## 2013-12-09 LAB — HEPATIC FUNCTION PANEL
ALT: 12 U/L (ref 0–35)
AST: 14 U/L (ref 0–37)
Albumin: 4.2 g/dL (ref 3.5–5.2)
Alkaline Phosphatase: 38 U/L — ABNORMAL LOW (ref 39–117)
Bilirubin, Direct: 0.1 mg/dL (ref 0.0–0.3)
Indirect Bilirubin: 0.3 mg/dL (ref 0.2–1.2)
Total Bilirubin: 0.4 mg/dL (ref 0.2–1.2)
Total Protein: 6.7 g/dL (ref 6.0–8.3)

## 2013-12-09 LAB — FERRITIN: Ferritin: 58 ng/mL (ref 10–291)

## 2013-12-09 LAB — TSH: TSH: 0.799 u[IU]/mL (ref 0.350–4.500)

## 2013-12-09 LAB — HEMOGLOBIN A1C
Hgb A1c MFr Bld: 6.4 % — ABNORMAL HIGH (ref <5.7)
Mean Plasma Glucose: 137 mg/dL — ABNORMAL HIGH (ref <117)

## 2013-12-09 LAB — INSULIN, FASTING: Insulin fasting, serum: 50 u[IU]/mL — ABNORMAL HIGH (ref 3–28)

## 2013-12-09 LAB — VITAMIN B12: Vitamin B-12: 497 pg/mL (ref 211–911)

## 2013-12-09 LAB — FOLATE RBC: RBC Folate: 241 ng/mL — ABNORMAL LOW (ref >280)

## 2013-12-09 LAB — IRON AND TIBC
%SAT: 15 % — ABNORMAL LOW (ref 20–55)
Iron: 56 ug/dL (ref 42–145)
TIBC: 376 ug/dL (ref 250–470)
UIBC: 320 ug/dL (ref 125–400)

## 2013-12-19 ENCOUNTER — Other Ambulatory Visit: Payer: Self-pay | Admitting: Internal Medicine

## 2013-12-23 ENCOUNTER — Other Ambulatory Visit: Payer: Self-pay | Admitting: *Deleted

## 2013-12-23 MED ORDER — DEXLANSOPRAZOLE 60 MG PO CPDR: 60.0000 mg | DELAYED_RELEASE_CAPSULE | Freq: Every day | ORAL | Status: DC

## 2013-12-26 ENCOUNTER — Other Ambulatory Visit: Payer: Self-pay | Admitting: Physician Assistant

## 2013-12-26 NOTE — Telephone Encounter (Signed)
Called refill in to pharmacy    

## 2013-12-29 ENCOUNTER — Telehealth: Payer: Self-pay | Admitting: *Deleted

## 2013-12-29 NOTE — Telephone Encounter (Signed)
Left message to inform patient Dr Oneta Rack does not have a CPT code for a ZAAZ machine and he looked at device on-line and does not feel it would be beneficial.

## 2014-01-16 ENCOUNTER — Ambulatory Visit: Payer: Medicare Other | Admitting: Podiatry

## 2014-01-16 ENCOUNTER — Telehealth: Payer: Self-pay | Admitting: *Deleted

## 2014-01-16 ENCOUNTER — Other Ambulatory Visit: Payer: Self-pay | Admitting: Internal Medicine

## 2014-01-16 NOTE — Telephone Encounter (Signed)
Patient called requesting refill on Tramadol 50 mg 2 po TID.  Per Dr. Oneta Rack, I called and advised patient that Dr. Oneta Rack does not prescribe that Rx for her at that dosage and she needs to get all pain Rxs from her pain management doctor.  Patient aware and will call Pain Management for refill.

## 2014-01-19 ENCOUNTER — Ambulatory Visit: Payer: Medicare Other | Admitting: Podiatry

## 2014-01-23 ENCOUNTER — Ambulatory Visit: Payer: Medicare Other | Admitting: Podiatry

## 2014-01-30 ENCOUNTER — Other Ambulatory Visit: Payer: Self-pay | Admitting: Physician Assistant

## 2014-01-30 ENCOUNTER — Telehealth: Payer: Self-pay | Admitting: Internal Medicine

## 2014-01-30 NOTE — Telephone Encounter (Signed)
gabapentin (NEURONTIN) 800 MG tablet REFILL REQUEST

## 2014-02-01 ENCOUNTER — Encounter: Payer: Self-pay | Admitting: Podiatry

## 2014-02-01 ENCOUNTER — Ambulatory Visit (INDEPENDENT_AMBULATORY_CARE_PROVIDER_SITE_OTHER): Payer: Medicare Other | Admitting: Podiatry

## 2014-02-01 VITALS — BP 112/75 | HR 74 | Resp 17

## 2014-02-01 DIAGNOSIS — M2040 Other hammer toe(s) (acquired), unspecified foot: Secondary | ICD-10-CM

## 2014-02-01 NOTE — Progress Notes (Signed)
Subjective:     Patient ID: Crystal [name], female   DOB: [birth year], 64 y.o.   MRN: [id number]  HPI patient presents stating the second and third toes of my right foot are really bothering me and I want them fixed. I'm not having surgery on my neck currently   Review of Systems     Objective:   Physical Exam Neurovascular status intact with no changes in health history and elevated second and third toes right with moderate rigid contracture and pain when pressed to the interphalangeal joint    Assessment:     Hammertoe deformity second and third right with previous surgery done that has not successfully alleviated symptoms    Plan:     Reviewed conditions discussed and this is revisional surgery and there is no guarantee as far as results. Patient wants procedure and today I allowed her to read consent form line byline for digital fusion of digits 2 and 3 right foot with pin fixation. Patient wants surgery signed consent form and is scheduled for outpatient surgery understanding total recovery. Can take 6 months to one year and no guarantee as to final position of the toes

## 2014-02-03 ENCOUNTER — Other Ambulatory Visit: Payer: Self-pay | Admitting: Emergency Medicine

## 2014-02-09 ENCOUNTER — Encounter: Payer: Self-pay | Admitting: Physician Assistant

## 2014-02-09 ENCOUNTER — Ambulatory Visit (INDEPENDENT_AMBULATORY_CARE_PROVIDER_SITE_OTHER): Payer: Medicare Other | Admitting: Physician Assistant

## 2014-02-09 VITALS — BP 110/62 | HR 88 | Temp 97.7°F | Resp 16 | Wt 186.0 lb

## 2014-02-09 DIAGNOSIS — R059 Cough, unspecified: Secondary | ICD-10-CM

## 2014-02-09 DIAGNOSIS — R05 Cough: Secondary | ICD-10-CM

## 2014-02-09 MED ORDER — LOSARTAN POTASSIUM 50 MG PO TABS: 50.0000 mg | ORAL_TABLET | Freq: Every day | ORAL | Status: DC

## 2014-02-09 NOTE — Patient Instructions (Signed)
Stop/finish the benazepril and start on the new BP medication losartan once daily  If you feel your cough/ear is worse, fever, chills, call the office/message and I will send in Amoxacillin.

## 2014-02-09 NOTE — Progress Notes (Signed)
   Subjective:    Patient ID: Crystal [name], female    DOB: April 13, 1951, 63 y.o.   MRN: [id number]  Cough This is a new problem. Episode onset: 4 weeks. The problem has been unchanged. The cough is non-productive. Associated symptoms include ear congestion (right ear), ear pain, nasal congestion, postnasal drip, a rash (some chelitis on left mouth and anterior chest. ) and rhinorrhea. Pertinent negatives include no chest pain, chills, fever, headaches, heartburn, hemoptysis, myalgias, sore throat, shortness of breath, sweats, weight loss or wheezing. Treatments tried: nasocort. The treatment provided no relief.    Review of Systems  Constitutional: Negative for fever, chills, weight loss and diaphoresis.  HENT: Positive for congestion, ear pain, postnasal drip, rhinorrhea, sinus pressure and sneezing. Negative for sore throat.   Respiratory: Positive for cough. Negative for hemoptysis, chest tightness, shortness of breath and wheezing.   Cardiovascular: Negative.  Negative for chest pain.  Gastrointestinal: Negative.  Negative for heartburn.  Genitourinary: Negative.   Musculoskeletal: Negative for myalgias and neck pain.  Skin: Positive for rash (some chelitis on left mouth and anterior chest. ).  Neurological: Negative for headaches.       Objective:   Physical Exam  Constitutional: She is oriented to person, place, and time. She appears well-developed and well-nourished.  HENT:  Head: Normocephalic and atraumatic.  Mouth/Throat: Uvula is midline. Posterior oropharyngeal edema present. No oropharyngeal exudate, posterior oropharyngeal erythema or tonsillar abscesses.  Bilateral TM's appear dull  Eyes: Conjunctivae are normal. Pupils are equal, round, and reactive to light.  Neck: Normal range of motion. Neck supple.  Cardiovascular: Normal rate and regular rhythm.   Pulmonary/Chest: Effort normal and breath sounds normal.  Abdominal: Soft. Bowel sounds are normal.   Lymphadenopathy:    She has no cervical adenopathy.  Neurological: She is alert and oriented to person, place, and time.  Skin: Skin is warm and dry.       Assessment & Plan:  Non productive cough X 1 month- has had sick exposure, but no fever, chills, etc- will try to switch the ACE to losartan 50 mg QD and if it is not better will get ABX, get on OTC allergy pill.

## 2014-02-12 ENCOUNTER — Other Ambulatory Visit: Payer: Self-pay | Admitting: Internal Medicine

## 2014-02-14 ENCOUNTER — Telehealth: Payer: Self-pay

## 2014-02-14 ENCOUNTER — Telehealth: Payer: Self-pay | Admitting: *Deleted

## 2014-02-14 ENCOUNTER — Telehealth: Payer: Self-pay | Admitting: Podiatry

## 2014-02-14 DIAGNOSIS — M2040 Other hammer toe(s) (acquired), unspecified foot: Secondary | ICD-10-CM

## 2014-02-14 NOTE — Telephone Encounter (Signed)
Spoke with pt and called in a medication that she can take

## 2014-02-14 NOTE — Telephone Encounter (Signed)
Called the pharmacy as the patient states she has multiple allergies as she is unable to take the prescribed pain medication. Discussed her allergies and current medications with the pharmacist. Recommended Tramadol. Called in Tramadol 50mg  1 tab PO q4h prn pain disp #40. Patient notified.

## 2014-02-14 NOTE — Telephone Encounter (Signed)
Left message for pt to call back regarding medication question

## 2014-02-14 NOTE — Telephone Encounter (Signed)
Spoke with pt in regards to pain medication management, told her that ultram was called in to her pharmacy and to take as directed

## 2014-02-14 NOTE — Telephone Encounter (Signed)
Had surgery this morning.  The prescription my husband picked up is Hydrocodone Acetaminophen.  I interacts with one of my medicines.  Plus I had listed it under allergies that I itch with Hydrocodone.  Please give me a call back.

## 2014-02-15 ENCOUNTER — Encounter: Payer: Self-pay | Admitting: Podiatry

## 2014-02-15 NOTE — Progress Notes (Signed)
Dr Paulla Dolly performed a right hammertoe repair of 2,3 met on 02/14/14. Prescribed vicodin 10/320m #25 1 Q6-8 hrs prn pain

## 2014-02-15 NOTE — Telephone Encounter (Signed)
Can probably take ibuprophen if not she can come in for demerol

## 2014-02-16 ENCOUNTER — Other Ambulatory Visit: Payer: Self-pay | Admitting: *Deleted

## 2014-02-16 MED ORDER — FENOFIBRATE MICRONIZED 134 MG PO CAPS: 134.0000 mg | ORAL_CAPSULE | Freq: Every day | ORAL | Status: DC

## 2014-02-16 MED ORDER — BENAZEPRIL HCL 20 MG PO TABS: 20.0000 mg | ORAL_TABLET | Freq: Every day | ORAL | Status: DC

## 2014-02-20 ENCOUNTER — Encounter: Payer: Self-pay | Admitting: Podiatry

## 2014-02-20 ENCOUNTER — Ambulatory Visit: Payer: Medicare Other | Admitting: Podiatry

## 2014-02-20 ENCOUNTER — Ambulatory Visit (INDEPENDENT_AMBULATORY_CARE_PROVIDER_SITE_OTHER): Payer: Medicare Other

## 2014-02-20 VITALS — BP 133/72 | HR 84 | Resp 13 | Ht 63.5 in | Wt 182.0 lb

## 2014-02-20 DIAGNOSIS — M2041 Other hammer toe(s) (acquired), right foot: Secondary | ICD-10-CM

## 2014-02-20 DIAGNOSIS — M2040 Other hammer toe(s) (acquired), unspecified foot: Secondary | ICD-10-CM

## 2014-02-20 NOTE — Progress Notes (Signed)
Subjective:     Patient ID: Crystal [name], female   DOB: Nov 01, 1950, 63 y.o.   MRN: [id number]  HPI patient states I am doing fine with my second and third toes on my right foot with minimal discomfort and able to walk without pain. 6 days after having digital surgery 23 right   Review of Systems     Objective:   Physical Exam Neurovascular status intact with muscle strength adequate negative Homans sign noted and pins in place second and third toes with straight toes noted and wound edges well coapted with stitches in place    Assessment:     Doing well post digital fusion digits 23 right foot    Plan:     X-rays reviewed and sterile dressing reapplied. Digital splint dispensed and reappoint 2 weeks for suture removal

## 2014-02-27 ENCOUNTER — Telehealth: Payer: Self-pay | Admitting: Neurology

## 2014-02-27 NOTE — Telephone Encounter (Signed)
Patient calling because she wants to know if Dr. Frances Furbish will be her sleep provider since she is already established with her.  Advised the patient that I would document her request.

## 2014-02-28 ENCOUNTER — Other Ambulatory Visit: Payer: Self-pay | Admitting: *Deleted

## 2014-02-28 MED ORDER — DULOXETINE HCL 60 MG PO CPEP: ORAL_CAPSULE | ORAL | Status: DC

## 2014-03-01 ENCOUNTER — Ambulatory Visit: Payer: Self-pay | Admitting: Internal Medicine

## 2014-03-01 NOTE — Telephone Encounter (Signed)
Please look at my note from 09/13/2013 for additional information in the assessment and plan. Essentially, I had encouraged her to followup with her sleep physician and I did not have to see her back on a scheduled basis because she was doing well from the [place] standpoint. We did not make a followup appointment for that reason. I have also encouraged her to let me have a look at her sleep records but she could not recall the name of her sleep doctor. If she could please get me her sleep study results and records on her sleep evaluation and a compliance download we can take it from there. Apparently she is on BiPAP therapy.

## 2014-03-07 ENCOUNTER — Telehealth: Payer: Self-pay | Admitting: Podiatry

## 2014-03-07 ENCOUNTER — Ambulatory Visit (INDEPENDENT_AMBULATORY_CARE_PROVIDER_SITE_OTHER): Payer: Medicare Other

## 2014-03-07 DIAGNOSIS — Z4802 Encounter for removal of sutures: Secondary | ICD-10-CM

## 2014-03-07 NOTE — Telephone Encounter (Signed)
Pt would like to know if it is ok for her to drive a car?  She asked that a nurse/assistant to give her a call.

## 2014-03-07 NOTE — Progress Notes (Signed)
   Subjective:    Patient ID: Crystal [name], female    DOB: December 28, 1950, 63 y.o.   MRN: [id number]  HPI  Pt presents for suture removal   Review of Systems     Objective:   Physical Exam Sutures removed, wound edges aligned and approximated, afebrile and foot is slightly swollen, no erythema.       Assessment & Plan:  Advised to remain in digital splint to keep toes aligned, one remaining suture on 3rd met will be removed on next visit once scab is gone. Reappointed to come back in 2 weeks to see Dr Paulla Dolly and have external pins removed.

## 2014-03-10 NOTE — Telephone Encounter (Signed)
Called pt and told her to wait until the pin in her toe is removed before she drives

## 2014-03-15 ENCOUNTER — Ambulatory Visit: Payer: Self-pay | Admitting: Internal Medicine

## 2014-03-16 ENCOUNTER — Encounter: Payer: Self-pay | Admitting: Emergency Medicine

## 2014-03-20 ENCOUNTER — Other Ambulatory Visit: Payer: Self-pay | Admitting: Physician Assistant

## 2014-03-22 ENCOUNTER — Encounter: Payer: Medicare Other | Admitting: Podiatry

## 2014-03-23 ENCOUNTER — Ambulatory Visit (INDEPENDENT_AMBULATORY_CARE_PROVIDER_SITE_OTHER): Payer: Medicare Other

## 2014-03-23 ENCOUNTER — Ambulatory Visit (INDEPENDENT_AMBULATORY_CARE_PROVIDER_SITE_OTHER): Payer: Medicare Other | Admitting: Podiatry

## 2014-03-23 ENCOUNTER — Encounter: Payer: Self-pay | Admitting: Podiatry

## 2014-03-23 VITALS — BP 108/69 | HR 82 | Resp 15 | Ht 63.75 in | Wt 182.0 lb

## 2014-03-23 DIAGNOSIS — M2041 Other hammer toe(s) (acquired), right foot: Secondary | ICD-10-CM

## 2014-03-23 DIAGNOSIS — M2040 Other hammer toe(s) (acquired), unspecified foot: Secondary | ICD-10-CM

## 2014-03-23 NOTE — Progress Notes (Signed)
   Subjective:    Patient ID: Crystal [name], female    DOB: 07-31-1950, 63 y.o.   MRN: [id number]  HPI Comments: DOS 02/14/2014 right 2,3 hammer toe repair with pins     Review of Systems     Objective:   Physical Exam        Assessment & Plan:

## 2014-03-24 NOTE — Progress Notes (Signed)
Subjective:     Patient ID: Crystal [name], female   DOB: 11/22/1950, 63 y.o.   MRN: [id number]  HPI patient states I'm doing well with my surgery and ready to get these pins taken out   Review of Systems     Objective:   Physical Exam Neurovascular status intact with digits and good alignment and pins intact second and third toes right with toes that are in a very nice position in the transverse and sagittal plane    Assessment:     Doing well postop digital fusion digits 23 right    Plan:     Pins are removed today with sterile dressings applied x-rays reviewed and gradually allow patient to return to shoe gear over the next couple weeks and begin increased activities. Reappoint 4 weeks earlier if necessary

## 2014-03-26 ENCOUNTER — Other Ambulatory Visit: Payer: Self-pay | Admitting: Physician Assistant

## 2014-04-14 ENCOUNTER — Other Ambulatory Visit: Payer: Self-pay

## 2014-04-18 ENCOUNTER — Other Ambulatory Visit: Payer: Self-pay | Admitting: Internal Medicine

## 2014-04-24 ENCOUNTER — Ambulatory Visit (INDEPENDENT_AMBULATORY_CARE_PROVIDER_SITE_OTHER): Payer: Medicare Other | Admitting: Podiatry

## 2014-04-24 ENCOUNTER — Ambulatory Visit (INDEPENDENT_AMBULATORY_CARE_PROVIDER_SITE_OTHER): Payer: Medicare Other

## 2014-04-24 VITALS — BP 112/78 | HR 74 | Temp 98.2°F | Resp 17

## 2014-04-24 DIAGNOSIS — Z9889 Other specified postprocedural states: Secondary | ICD-10-CM

## 2014-04-24 NOTE — Progress Notes (Signed)
Subjective:     Patient ID: Crystal [name], female   DOB: [birth year], 63 y.o.   MRN: [id number]  HPI patient statesshe traumatized the right second and third toe and just wanted to make sure everything was okay after surgery   Review of Systems     Objective:   Physical Exam Neurovascular status unchanged with good alignment of the second and third toes with mild redness and swelling noted but no indications of change in structural position    Assessment:     Trauma to the right foot with possible damage to the underlying digits    Plan:     X-rays reviewed and explained condition. Allow patient to return to normal activity and reappoint on an as-needed basis

## 2014-04-24 NOTE — Progress Notes (Signed)
   Subjective:    Patient ID: Crystal [name], female    DOB: 01/18/1951, 63 y.o.   MRN: [id number]  HPI  Pt presents as routine post op, states that she stumped her toe 3 weeks ago and has had intermittent pain since. Noted some redness and swelling on 2,3 met right foot  Review of Systems     Objective:   Physical Exam        Assessment & Plan:

## 2014-04-26 DIAGNOSIS — M5412 Radiculopathy, cervical region: Secondary | ICD-10-CM | POA: Insufficient documentation

## 2014-04-27 ENCOUNTER — Other Ambulatory Visit: Payer: Self-pay | Admitting: Internal Medicine

## 2014-05-01 ENCOUNTER — Other Ambulatory Visit: Payer: Self-pay | Admitting: Internal Medicine

## 2014-05-18 ENCOUNTER — Other Ambulatory Visit: Payer: Self-pay | Admitting: Physician Assistant

## 2014-05-19 ENCOUNTER — Other Ambulatory Visit: Payer: Self-pay | Admitting: Internal Medicine

## 2014-05-19 MED ORDER — LORAZEPAM 2 MG PO TABS: ORAL_TABLET | ORAL | Status: DC

## 2014-06-01 ENCOUNTER — Other Ambulatory Visit: Payer: Self-pay

## 2014-06-01 MED ORDER — DULOXETINE HCL 60 MG PO CPEP: ORAL_CAPSULE | ORAL | Status: DC

## 2014-06-07 ENCOUNTER — Encounter: Payer: Self-pay | Admitting: Internal Medicine

## 2014-06-26 ENCOUNTER — Encounter: Payer: Self-pay | Admitting: Internal Medicine

## 2014-07-07 ENCOUNTER — Ambulatory Visit: Payer: Medicare Other | Admitting: Internal Medicine

## 2014-07-07 NOTE — Progress Notes (Signed)
Patient ID: Crystal [name], female   DOB: 06-07-1951, 64 y.o.   MRN: [id number] R E S [facility]

## 2014-07-11 ENCOUNTER — Encounter: Payer: Self-pay | Admitting: Internal Medicine

## 2014-07-11 ENCOUNTER — Ambulatory Visit (INDEPENDENT_AMBULATORY_CARE_PROVIDER_SITE_OTHER): Payer: Medicare Other | Admitting: Internal Medicine

## 2014-07-11 VITALS — BP 110/72 | HR 68 | Temp 99.0°F | Resp 16 | Ht 64.0 in | Wt 184.6 lb

## 2014-07-11 DIAGNOSIS — K50919 Crohn's disease, unspecified, with unspecified complications: Secondary | ICD-10-CM

## 2014-07-11 DIAGNOSIS — M797 Fibromyalgia: Secondary | ICD-10-CM

## 2014-07-11 DIAGNOSIS — Z0000 Encounter for general adult medical examination without abnormal findings: Secondary | ICD-10-CM

## 2014-07-11 DIAGNOSIS — G4733 Obstructive sleep apnea (adult) (pediatric): Secondary | ICD-10-CM

## 2014-07-11 DIAGNOSIS — K219 Gastro-esophageal reflux disease without esophagitis: Secondary | ICD-10-CM

## 2014-07-11 DIAGNOSIS — F32A Depression, unspecified: Secondary | ICD-10-CM

## 2014-07-11 DIAGNOSIS — R6889 Other general symptoms and signs: Secondary | ICD-10-CM

## 2014-07-11 DIAGNOSIS — Z79899 Other long term (current) drug therapy: Secondary | ICD-10-CM

## 2014-07-11 DIAGNOSIS — E039 Hypothyroidism, unspecified: Secondary | ICD-10-CM

## 2014-07-11 DIAGNOSIS — Z1331 Encounter for screening for depression: Secondary | ICD-10-CM

## 2014-07-11 DIAGNOSIS — E559 Vitamin D deficiency, unspecified: Secondary | ICD-10-CM

## 2014-07-11 DIAGNOSIS — Z0001 Encounter for general adult medical examination with abnormal findings: Secondary | ICD-10-CM

## 2014-07-11 DIAGNOSIS — Z1212 Encounter for screening for malignant neoplasm of rectum: Secondary | ICD-10-CM

## 2014-07-11 DIAGNOSIS — I10 Essential (primary) hypertension: Secondary | ICD-10-CM

## 2014-07-11 DIAGNOSIS — R7309 Other abnormal glucose: Secondary | ICD-10-CM

## 2014-07-11 DIAGNOSIS — F329 Major depressive disorder, single episode, unspecified: Secondary | ICD-10-CM

## 2014-07-11 DIAGNOSIS — E785 Hyperlipidemia, unspecified: Secondary | ICD-10-CM

## 2014-07-11 DIAGNOSIS — I2510 Atherosclerotic heart disease of native coronary artery without angina pectoris: Secondary | ICD-10-CM

## 2014-07-11 LAB — CBC WITH DIFFERENTIAL/PLATELET
Basophils Absolute: 0.1 10*3/uL (ref 0.0–0.1)
Basophils Relative: 1 % (ref 0–1)
Eosinophils Absolute: 0.2 10*3/uL (ref 0.0–0.7)
Eosinophils Relative: 3 % (ref 0–5)
HCT: 47.2 % — ABNORMAL HIGH (ref 36.0–46.0)
Hemoglobin: 15.8 g/dL — ABNORMAL HIGH (ref 12.0–15.0)
Lymphocytes Relative: 24 % (ref 12–46)
Lymphs Abs: 1.6 10*3/uL (ref 0.7–4.0)
MCH: 30.3 pg (ref 26.0–34.0)
MCHC: 33.5 g/dL (ref 30.0–36.0)
MCV: 90.6 fL (ref 78.0–100.0)
MPV: 8.9 fL (ref 8.6–12.4)
Monocytes Absolute: 0.7 10*3/uL (ref 0.1–1.0)
Monocytes Relative: 10 % (ref 3–12)
Neutro Abs: 4 10*3/uL (ref 1.7–7.7)
Neutrophils Relative %: 62 % (ref 43–77)
Platelets: 324 10*3/uL (ref 150–400)
RBC: 5.21 MIL/uL — ABNORMAL HIGH (ref 3.87–5.11)
RDW: 14.1 % (ref 11.5–15.5)
WBC: 6.5 10*3/uL (ref 4.0–10.5)

## 2014-07-11 LAB — HEMOGLOBIN A1C
Hgb A1c MFr Bld: 6.3 % — ABNORMAL HIGH (ref <5.7)
Mean Plasma Glucose: 134 mg/dL — ABNORMAL HIGH (ref <117)

## 2014-07-11 MED ORDER — BUPROPION HCL ER (XL) 300 MG PO TB24: ORAL_TABLET | ORAL | Status: DC

## 2014-07-11 NOTE — Progress Notes (Signed)
Patient ID: NOVEMBER SYPHER, female   DOB: [birth year], 64 y.o.   MRN: [id number]  [facility] VISIT AND CPE  Assessment:   1. Essential hypertension  - Microalbumin / creatinine urine ratio - EKG 12-Lead - [place], RETROPERITNL ABD,  LTD - TSH  2. Hyperlipidemia  - Lipid panel  3. PreDiabetes  - Hemoglobin A1c - Insulin, fasting  4. Vitamin D deficiency  - Vit D  25 hydroxy (rtn osteoporosis monitoring)  5. Fibromyalgia   6. Hypothyroidism   7. ASCVD (arteriosclerotic cardiovascular disease)   8. Regional enteritis (Crohn's Dz)    9. Gastroesophageal reflux disease   10. Screening for rectal cancer  - POC Hemoccult Bld/Stl (3-Cd Home Screen); Future  11. Depression screen  - - Rx Wellbutrin added to regimen  12. Medication management  - Urine Microscopic - CBC with Differential - BASIC METABOLIC PANEL WITH GFR - Hepatic function panel - Magnesium  13 - OSA -   - on BiPAP  - Refer to Dr Dohmeier  14. DJD, Cervical   15. Bilat Shoulder DJD -  16 . DDD, Cervical -   Plan:   During the course of the visit the patient was educated and counseled about appropriate screening and preventive services including:    Pneumococcal vaccine   Influenza vaccine  Td vaccine  Screening electrocardiogram  Bone densitometry screening  Colorectal cancer screening  Diabetes screening  Glaucoma screening  Nutrition counseling   Advanced directives: requested  Screening recommendations, referrals: Vaccinations: DT  vaccine 2006 Influenza vaccine HD 05/14/14 Pneumococcal vaccine 2007 Prevnar vaccine unavailable Shingles vaccine 2009 Hep B vaccine not indicated  Nutrition assessed and recommended  Colonoscopy 2010 - Dr Kinnie Scales Recommended yearly ophthalmology/optometry visit for glaucoma screening and checkup Recommended yearly dental visit for hygiene and checkup Advanced directives - Has HC POA, but no living will - offered   forms  Conditions/risks identified: BMI: Discussed weight loss, diet, and increase physical activity.  Increase physical activity: AHA recommends 150 minutes of physical activity a week.  Medications reviewed T2 Diabetes is not at goal, She has attempted dietary management, ACE/ARB therapy: No, Reason not on Ace Inhibitor/ARB therapy:  Allergic to  ACEi - on Losartin Urinary Incontinence is not an issue: discussed non pharmacology and pharmacology options.  Fall risk: low- discussed PT, home fall assessment, medications.   Subjective:   JESUSITA JOCELYN is a 64 y.o. [name] who presents for Medicare Annual Wellness Visit and complete physical.  Date of last medicare wellness visit is unknown.  She has had elevated blood pressure since 1996. Her blood pressure has been controlled at home, & today their BP is BP: 110/72 mmHg She does workout. She denies chest pain, shortness of breath, dizziness.   She is on cholesterol medication and denies myalgias. Her cholesterol is at goal. The cholesterol last visit was:  Lab Results  Component Value Date   CHOL 174 12/08/2013   HDL 38* 12/08/2013   LDLCALC 85 12/08/2013   TRIG 255* 12/08/2013   CHOLHDL 4.6 12/08/2013    She has had T2_NIDDM for 3 years (A1c 6.6% in Oct 2012 ). She has been working on diet and exercise for diabetes, and denies foot ulcerations, hyperglycemia, hypoglycemia , paresthesia of the feet, polydipsia, polyuria, visual disturbances and weight loss.  Last A1C in the office was:  Lab Results  Component Value Date   HGBA1C 6.4* 12/08/2013   Patient is on Vitamin D supplement.   (Vit D was 33 in  2008)  Lab Results  Component Value Date   VD25OH 109* 09/07/2013      Names of Other Physician/Practitioners you currently use: 1. [facility] here for primary care 2. Dr Josephina Gip [facility] -  eye doctor, last visit Oct 2015 3. Dr Jari Favre, DDS, dentist, last visit July  2015  Patient Care Team: [name], MD as PCP - General (Internal Medicine) Huston Foley, MD as Attending Physician (Neurology) Griffith Citron, MD as Consulting Physician (Gastroenterology) Maeola Harman, MD as Consulting Physician (Neurosurgery) Antony Contras, MD as Consulting Physician (Ophthalmology) [facility] Bjorn Loser, MD as Consulting Physician (Dermatology) Wendee Copp, MD as Referring Physician (Orthopedic Surgery)  Medication Review: Medication Sig  . acyclovir (ZOVIRAX) 800 MG tablet take 1 tablet by mouth 3 times daily before meals and 2 at bedtime  . bisoprolol-hydrochlorothiazide (ZIAC) 10-6.25 MG per tablet TAKE ONE TABLET BY MOUTH EVERY DAY FOR BLOOD PRESSURE   . buprenorphine (BUTRANS - DOSED MCG/HR) 5 MCG/HR PTWK patch Place onto the skin.  [name] dexlansoprazole (DEXILANT) 60 MG capsule Take 1 capsule (60 mg total) by mouth daily.  . DULoxetine (CYMBALTA) 60 MG capsule TAKE ONE CAPSULE BY MOUTH ONE TIME DAILY  . fenofibrate micronized (LOFIBRA) 134 MG capsule Take 1 capsule (134 mg total) by mouth daily before breakfast.  . gabapentin (NEURONTIN) 800 MG tablet TAKE ONE TABLET BY MOUTH FOUR TIMES DAILY   . levothyroxine (SYNTHROID, LEVOTHROID) 100 MCG tablet Take 1 tablet by mouth on tues, wed, thurs, sat, & sun. Take 1 & 1/2 tablet on mon & fri.  [name] LORazepam (ATIVAN) 2 MG tablet Take 1/2 to 1 tablet at bedtime as needed for sleep  . losartan (COZAAR) 50 MG tablet Take 1 tablet (50 mg total) by mouth daily.  [name] lubiprostone (AMITIZA) 24 MCG capsule Take 24 mcg by mouth 2 (two) times daily with a meal.  . metaxalone (SKELAXIN) 800 MG tablet Take 1 tablet (800 mg total) by mouth 3 (three) times daily.  [name] oxybutynin (DITROPAN) 5 MG tablet take 1 tablet twice up to three times a day  . pilocarpine (SALAGEN) 5 MG tablet Take 1 tablet (5 mg total) by mouth 2 (two) times daily. For dry mouth  . rOPINIRole (REQUIP) 4 MG tablet TAKE ONE TABLET BY MOUTH NIGHTLY AT BEDTIME for  restless leg  . Triamcinolone Acetonide (NASACORT ALLERGY 24HR NA) Place 1 spray into the nose 2 (two) times daily.  . Vitamin D, Ergocalciferol, (DRISDOL) 50000 UNITS CAPS capsule Take one capsule by mouth one time daily   Current Problems (verified) Patient Active Problem List   Diagnosis Date Noted  . Medication management 10/25/2013  . Rotator cuff arthropathy 09/15/2013  . Hyperlipidemia   . PreDiabetes   . Hypothyroid   . Depression   . Vitamin D deficiency   . ASCVD (arteriosclerotic cardiovascular disease)   . Gait disorder 10/26/2012  . Tremor 10/26/2012  . Disorder of rotator cuff 08/23/2012  . Bunion 11/19/2011  . Rhinitis 06/25/2010  . OBSTRUCTIVE SLEEP APNEA 06/21/2010  . RESTLESS LEG SYNDROME 06/21/2010  . Essential hypertension 06/21/2010  . GERD 06/21/2010  . Crohn's Enterocolitis / Regional enteritis 06/21/2010  . Fibromyalgia 06/21/2010    Screening Tests Health Maintenance  Topic Date Due  . TETANUS/TDAP  06/30/2014  . MAMMOGRAM  09/25/2014  . INFLUENZA VACCINE  01/29/2015  . COLONOSCOPY  05/16/2018  . DEXA SCAN  Completed  . PNEUMOCOCCAL POLYSACCHARIDE VACCINE AGE 41 AND OVER  Completed  .  ZOSTAVAX  Completed   Immunization History  Administered Date(s) Administered  . Influenza Whole 03/30/2010  . Influenza-Unspecified 03/31/2011, 05/14/2014  . Pneumococcal-Unspecified 06/30/2005  . Td 06/30/2004  . Zoster 07/01/2007   Preventative care: Last colonoscopy: 2010  Prior vaccinations: TD or : 2006  Influenza: HD 05/14/2014  Pneumococcal: 2007 Prevnar: unavailable Shingles/Zostavax: 2009  History reviewed: allergies, current medications, past family history, past medical history, past social history, past surgical history and problem list  Risk Factors: Tobacco History  Substance Use Topics  . Smoking status: Never Smoker   . Smokeless tobacco: Not on file  . Alcohol Use: Yes     Comment: rarely   She does not smoke.  Patient is not  a former smoker. Are there smokers in your home (other than you)?  No  Alcohol Current alcohol use: rarely  Caffeine Current caffeine use: coffee 1 cup /day  Exercise Current exercise: none  Nutrition/Diet Current diet: in general, a "healthy" diet    Cardiac risk factors: advanced age (older than 68 for men, 27 for women), diabetes mellitus, dyslipidemia, hypertension, obesity (BMI >= 30 kg/m2) and sedentary lifestyle.  Depression Screen (Note: if answer to either of the following is "Yes", a more complete depression screening is indicated)   Q1: Over the past two weeks, have you felt down, depressed or hopeless? No  Q2: Over the past two weeks, have you felt little interest or pleasure in doing things? No  Have you lost interest or pleasure in daily life? No  Do you often feel hopeless? No  Do you cry easily over simple problems? No  Activities of Daily Living In your present state of health, do you have any difficulty performing the following activities?:  Driving? No Managing money?  No Feeding yourself? No Getting from bed to chair? No Climbing a flight of stairs? No Preparing food and eating?: No Bathing or showering? No Getting dressed: No Getting to the toilet? No Using the toilet:No Moving around from place to place: No In the past year have you fallen or had a near fall?:No   Are you sexually active?  Yes  Do you have more than one partner?  No  Vision Difficulties: No  Hearing Difficulties: No Do you often ask people to speak up or repeat themselves? No Do you experience ringing or noises in your ears? No Do you have difficulty understanding soft or whispered voices? Sometimes.  Cognition  Do you feel that you have a problem with memory?No  Do you often misplace items? No  Do you feel safe at home?  Yes  Advanced directives Does patient have a Health Care Power of Attorney? Yes Does patient have a Living Will? No - offerede forms  In addition to  the HPI above,  No Fever-chills,  No Headache, No changes with Vision or hearing,  No problems swallowing food or Liquids,  No Chest pain or productive Cough or Shortness of Breath,  No Abdominal pain, No Nausea or Vomitting, Bowel movements are regular,  No Blood in stool or Urine,  No dysuria,  No new skin rashes or bruises,  (+) chronic neck pains followed by Dr Venetia Maxon (+) Chronic bilat shoulder pains. No new weakness, tingling, numbness in any extremity,  No recent weight loss,  No polyuria, polydypsia or polyphagia,  No significant Mental Stressors.  A full 10 point Review of Systems was done, except as stated above, all other Review of Systems were negative    Objective:  Blood pressure 110/72, pulse 68, temperature 99 F (37.2 C), resp. rate 16, height 5\' 4"  (1.626 m), weight 184 lb 9.6 oz (83.734 kg). Body mass index is 31.67 kg/(m^2).  General appearance: alert, no distress, WD/WN, female Cognitive Testing  Alert? Yes  Normal Appearance?Yes  Oriented to person? Yes  Place? Yes   Time? Yes  Recall of three objects?  Yes  Can perform simple calculations? Yes  Displays appropriate judgment? Yes  Can read the correct time from a watch/clock?Yes  HEENT: normocephalic, sclerae anicteric, TMs pearly, nares patent, no discharge or erythema, pharynx normal Oral cavity: MMM, no lesions Neck: supple, no lymphadenopathy, no thyromegaly, no masses Heart: RRR, normal S1, S2, no murmurs Lungs: CTA bilaterally, no wheezes, rhonchi, or rales Breasts: No abnormal masses. Abdomen: +bs, soft, non tender, non distended, no masses, no hepatomegaly, no splenomegaly Musculoskeletal: nontender, no swelling, no obvious deformity Extremities: no edema, no cyanosis, no clubbing Pulses: 2+ symmetric, upper and lower extremities, normal cap refill Neurological: alert, oriented x 3, CN2-12 intact, strength normal upper extremities and lower extremities, sensation normal throughout, DTRs 2+  throughout, no cerebellar signs, gait normal. Left hemifacial paresis (residuae of remote Bell's palsy) Psychiatric: normal affect, behavior normal, pleasant   Medicare Attestation I have personally reviewed: The patient's medical and social history Their use of alcohol, tobacco or illicit drugs Their current medications and supplements The patient's functional ability including ADLs,fall risks, home safety risks, cognitive, and hearing and visual impairment Diet and physical activities Evidence for depression or mood disorders  The patient's weight, height, BMI, and visual acuity have been recorded in the chart.  I have made referrals, counseling, and provided education to the patient based on review of the above and I have provided the patient with a written personalized care plan for preventive services.    Maisha Bogen DAVID, MD   07/11/2014

## 2014-07-11 NOTE — Patient Instructions (Signed)
Recommend the book "The END of DIETING" by Dr Baker Janus   and the book "The END of DIABETES " by Dr Excell Seltzer  At [email or web address] - get book & Audio CD's      Being diabetic has a  300% increased risk for heart attack, stroke, cancer, and alzheimer- type vascular dementia. It is very important that you work harder with diet by avoiding all foods that are white except chicken & fish. Avoid white rice (brown & wild rice is OK), white potatoes (sweetpotatoes in moderation is OK), White bread or wheat bread or anything made out of white flour like bagels, donuts, rolls, buns, biscuits, cakes, pastries, cookies, pizza crust, and pasta (made from white flour & egg whites) - vegetarian pasta or spinach or wheat pasta is OK. Multigrain breads like Arnold's or [employer], or multigrain sandwich thins or flatbreads.  Diet, exercise and weight loss can reverse and cure diabetes in the early stages.  Diet, exercise and weight loss is very important in the control and prevention of complications of diabetes which affects every system in your body, ie. Brain - dementia/stroke, eyes - glaucoma/blindness, heart - heart attack/heart failure, kidneys - dialysis, stomach - gastric paralysis, intestines - malabsorption, nerves - severe painful neuritis, circulation - gangrene & loss of a leg(s), and finally cancer and Alzheimers.    I recommend avoid fried & greasy foods,  sweets/candy, white rice (brown or wild rice or Quinoa is OK), white potatoes (sweet potatoes are OK) - anything made from white flour - bagels, doughnuts, rolls, buns, biscuits,white and wheat breads, pizza crust and traditional pasta made of white flour & egg white(vegetarian pasta or spinach or wheat pasta is OK).  Multi-grain bread is OK - like multi-grain flat bread or sandwich thins. Avoid alcohol in excess. Exercise is also important.    Eat all the vegetables you want - avoid meat, especially red meat and dairy - especially cheese.  Cheese  is the most concentrated form of trans-fats which is the worst thing to clog up our arteries. Veggie cheese is OK which can be found in the fresh produce section at [employer] or [employer] or [employer]  Preventive Care for Adults A healthy lifestyle and preventive care can promote health and wellness. Preventive health guidelines for women include the following key practices.  A routine yearly physical is a good way to check with your health care provider about your health and preventive screening. It is a chance to share any concerns and updates on your health and to receive a thorough exam.  Visit your dentist for a routine exam and preventive care every 6 months. Brush your teeth twice a day and floss once a day. Good oral hygiene prevents tooth decay and gum disease.  The frequency of eye exams is based on your age, health, family medical history, use of contact lenses, and other factors. Follow your health care provider's recommendations for frequency of eye exams.  Eat a healthy diet. Foods like vegetables, fruits, whole grains, low-fat dairy products, and lean protein foods contain the nutrients you need without too many calories. Decrease your intake of foods high in solid fats, added sugars, and salt. Eat the right amount of calories for you.Get information about a proper diet from your health care provider, if necessary.  Regular physical exercise is one of the most important things you can do for your health. Most adults should get at least 150 minutes of moderate-intensity exercise (any activity that increases  your heart rate and causes you to sweat) each week. In addition, most adults need muscle-strengthening exercises on 2 or more days a week.  Maintain a healthy weight. The body mass index (BMI) is a screening tool to identify possible weight problems. It provides an estimate of body fat based on height and weight. Your health care provider can find your BMI and can help you  achieve or maintain a healthy weight.For adults 20 years and older:  A BMI below 18.5 is considered underweight.  A BMI of 18.5 to 24.9 is normal.  A BMI of 25 to 29.9 is considered overweight.  A BMI of 30 and above is considered obese.  Maintain normal blood lipids and cholesterol levels by exercising and minimizing your intake of saturated fat. Eat a balanced diet with plenty of fruit and vegetables. If your lipid or cholesterol levels are high, you are over 50, or you are at high risk for heart disease, you may need your cholesterol levels checked more frequently.Ongoing high lipid and cholesterol levels should be treated with medicines if diet and exercise are not working.  If you smoke, find out from your health care provider how to quit. If you do not use tobacco, do not start.  Lung cancer screening is recommended for adults aged 55-80 years who are at high risk for developing lung cancer because of a history of smoking. A yearly low-dose CT scan of the lungs is recommended for people who have at least a 30-pack-year history of smoking and are a current smoker or have quit within the past 15 years. A pack year of smoking is smoking an average of 1 pack of cigarettes a day for 1 year (for example: 1 pack a day for 30 years or 2 packs a day for 15 years). Yearly screening should continue until the smoker has stopped smoking for at least 15 years. Yearly screening should be stopped for people who develop a health problem that would prevent them from having lung cancer treatment.  Avoid use of street drugs. Do not share needles with anyone. Ask for help if you need support or instructions about stopping the use of drugs.  High blood pressure causes heart disease and increases the risk of stroke.  Ongoing high blood pressure should be treated with medicines if weight loss and exercise do not work.  If you are 55-79 years old, ask your health care provider if you should take aspirin to  prevent strokes.  Diabetes screening involves taking a blood sample to check your fasting blood sugar level. This should be done once every 3 years, after age 45, if you are within normal weight and without risk factors for diabetes. Testing should be considered at a younger age or be carried out more frequently if you are overweight and have at least 1 risk factor for diabetes.  Breast cancer screening is essential preventive care for women. You should practice "breast self-awareness." This means understanding the normal appearance and feel of your breasts and may include breast self-examination. Any changes detected, no matter how small, should be reported to a health care provider. Women in their 20s and 30s should have a clinical breast exam (CBE) by a health care provider as part of a regular health exam every 1 to 3 years. After age 40, women should have a CBE every year. Starting at age 40, women should consider having a mammogram (breast X-ray test) every year. Women who have a family history of breast cancer should   talk to their health care provider about genetic screening. Women at a high risk of breast cancer should talk to their health care providers about having an MRI and a mammogram every year.  Breast cancer gene (BRCA)-related cancer risk assessment is recommended for women who have family members with BRCA-related cancers. BRCA-related cancers include breast, ovarian, tubal, and peritoneal cancers. Having family members with these cancers may be associated with an increased risk for harmful changes (mutations) in the breast cancer genes BRCA1 and BRCA2. Results of the assessment will determine the need for genetic counseling and BRCA1 and BRCA2 testing.  Routine pelvic exams to screen for cancer are no longer recommended for nonpregnant women who are considered low risk for cancer of the pelvic organs (ovaries, uterus, and vagina) and who do not have symptoms. Ask your health care provider  if a screening pelvic exam is right for you.  If you have had past treatment for cervical cancer or a condition that could lead to cancer, you need Pap tests and screening for cancer for at least 20 years after your treatment. If Pap tests have been discontinued, your risk factors (such as having a new sexual partner) need to be reassessed to determine if screening should be resumed. Some women have medical problems that increase the chance of getting cervical cancer. In these cases, your health care provider may recommend more frequent screening and Pap tests.    Colorectal cancer can be detected and often prevented. Most routine colorectal cancer screening begins at the age of 90 years and continues through age 8 years. However, your health care provider may recommend screening at an earlier age if you have risk factors for colon cancer. On a yearly basis, your health care provider may provide home test kits to check for hidden blood in the stool. Use of a small camera at the end of a tube, to directly examine the colon (sigmoidoscopy or colonoscopy), can detect the earliest forms of colorectal cancer. Talk to your health care provider about this at age 86, when routine screening begins. Direct exam of the colon should be repeated every 5-10 years through age 32 years, unless early forms of pre-cancerous polyps or small growths are found.  Osteoporosis is a disease in which the bones lose minerals and strength with aging. This can result in serious bone fractures or breaks. The risk of osteoporosis can be identified using a bone density scan. Women ages 75 years and over and women at risk for fractures or osteoporosis should discuss screening with their health care providers. Ask your health care provider whether you should take a calcium supplement or vitamin D to reduce the rate of osteoporosis.  Menopause can be associated with physical symptoms and risks. Hormone replacement therapy is available to  decrease symptoms and risks. You should talk to your health care provider about whether hormone replacement therapy is right for you.  Use sunscreen. Apply sunscreen liberally and repeatedly throughout the day. You should seek shade when your shadow is shorter than you. Protect yourself by wearing long sleeves, pants, a wide-brimmed hat, and sunglasses year round, whenever you are outdoors.  Once a month, do a whole body skin exam, using a mirror to look at the skin on your back. Tell your health care provider of new moles, moles that have irregular borders, moles that are larger than a pencil eraser, or moles that have changed in shape or color.  Stay current with required vaccines (immunizations).  Influenza vaccine. All adults  should be immunized every year.  Tetanus, diphtheria, and acellular pertussis (Td, Tdap) vaccine. Pregnant women should receive 1 dose of Tdap vaccine during each pregnancy. The dose should be obtained regardless of the length of time since the last dose. Immunization is preferred during the 27th-36th week of gestation. An adult who has not previously received Tdap or who does not know her vaccine status should receive 1 dose of Tdap. This initial dose should be followed by tetanus and diphtheria toxoids (Td) booster doses every 10 years. Adults with an unknown or incomplete history of completing a 3-dose immunization series with Td-containing vaccines should begin or complete a primary immunization series including a Tdap dose. Adults should receive a Td booster every 10 years.    Zoster vaccine. One dose is recommended for adults aged 44 years or older unless certain conditions are present.    Pneumococcal 13-valent conjugate (PCV13) vaccine. When indicated, a person who is uncertain of her immunization history and has no record of immunization should receive the PCV13 vaccine. An adult aged 74 years or older who has certain medical conditions and has not been previously  immunized should receive 1 dose of PCV13 vaccine. This PCV13 should be followed with a dose of pneumococcal polysaccharide (PPSV23) vaccine. The PPSV23 vaccine dose should be obtained at least 8 weeks after the dose of PCV13 vaccine. An adult aged 66 years or older who has certain medical conditions and previously received 1 or more doses of PPSV23 vaccine should receive 1 dose of PCV13. The PCV13 vaccine dose should be obtained 1 or more years after the last PPSV23 vaccine dose.    Pneumococcal polysaccharide (PPSV23) vaccine. When PCV13 is also indicated, PCV13 should be obtained first. All adults aged 89 years and older should be immunized. An adult younger than age 80 years who has certain medical conditions should be immunized. Any person who resides in a nursing home or long-term care facility should be immunized. An adult smoker should be immunized. People with an immunocompromised condition and certain other conditions should receive both PCV13 and PPSV23 vaccines. People with human immunodeficiency virus (HIV) infection should be immunized as soon as possible after diagnosis. Immunization during chemotherapy or radiation therapy should be avoided. Routine use of PPSV23 vaccine is not recommended for American Indians, [place] Natives, or people younger than 65 years unless there are medical conditions that require PPSV23 vaccine. When indicated, people who have unknown immunization and have no record of immunization should receive PPSV23 vaccine. One-time revaccination 5 years after the first dose of PPSV23 is recommended for people aged 19-64 years who have chronic kidney failure, nephrotic syndrome, asplenia, or immunocompromised conditions. People who received 1-2 doses of PPSV23 before age 34 years should receive another dose of PPSV23 vaccine at age 31 years or later if at least 5 years have passed since the previous dose. Doses of PPSV23 are not needed for people immunized with PPSV23 at or after  age 29 years.   Preventive Services / Frequency  Ages 55 years and over  Blood pressure check.  Lipid and cholesterol check.  Lung cancer screening. / Every year if you are aged 29-80 years and have a 30-pack-year history of smoking and currently smoke or have quit within the past 15 years. Yearly screening is stopped once you have quit smoking for at least 15 years or develop a health problem that would prevent you from having lung cancer treatment.  Clinical breast exam.** / Every year after age 40 years.  BRCA-related cancer risk assessment.** / For women who have family members with a BRCA-related cancer (breast, ovarian, tubal, or peritoneal cancers).  Mammogram.** / Every year beginning at age 40 years and continuing for as long as you are in good health. Consult with your health care provider.  Pap test.** / Every 3 years starting at age 30 years through age 65 or 70 years with 3 consecutive normal Pap tests. Testing can be stopped between 65 and 70 years with 3 consecutive normal Pap tests and no abnormal Pap or HPV tests in the past 10 years.  Fecal occult blood test (FOBT) of stool. / Every year beginning at age 50 years and continuing until age 75 years. You may not need to do this test if you get a colonoscopy every 10 years.  Flexible sigmoidoscopy or colonoscopy.** / Every 5 years for a flexible sigmoidoscopy or every 10 years for a colonoscopy beginning at age 50 years and continuing until age 75 years.  Hepatitis C blood test.** / For all people born from 1945 through 1965 and any individual with known risks for hepatitis C.  Osteoporosis screening.** / A one-time screening for women ages 65 years and over and women at risk for fractures or osteoporosis.  Skin self-exam. / Monthly.  Influenza vaccine. / Every year.  Tetanus, diphtheria, and acellular pertussis (Tdap/Td) vaccine.** / 1 dose of Td every 10 years.  Zoster vaccine.** / 1 dose for adults aged 60 years  or older.  Pneumococcal 13-valent conjugate (PCV13) vaccine.** / Consult your health care provider.  Pneumococcal polysaccharide (PPSV23) vaccine.** / 1 dose for all adults aged 65 years and older. Screening for abdominal aortic aneurysm (AAA)  by ultrasound is recommended for people who have history of high blood pressure or who are current or former smokers. 

## 2014-07-12 LAB — MAGNESIUM: Magnesium: 2.1 mg/dL (ref 1.5–2.5)

## 2014-07-12 LAB — HEPATIC FUNCTION PANEL
ALT: 10 U/L (ref 0–35)
AST: 14 U/L (ref 0–37)
Albumin: 4.3 g/dL (ref 3.5–5.2)
Alkaline Phosphatase: 37 U/L — ABNORMAL LOW (ref 39–117)
Bilirubin, Direct: 0.2 mg/dL (ref 0.0–0.3)
Indirect Bilirubin: 0.4 mg/dL (ref 0.2–1.2)
Total Bilirubin: 0.6 mg/dL (ref 0.2–1.2)
Total Protein: 7 g/dL (ref 6.0–8.3)

## 2014-07-12 LAB — BASIC METABOLIC PANEL WITH GFR
BUN: 14 mg/dL (ref 6–23)
CO2: 29 mEq/L (ref 19–32)
Calcium: 9.4 mg/dL (ref 8.4–10.5)
Chloride: 100 mEq/L (ref 96–112)
Creat: 0.84 mg/dL (ref 0.50–1.10)
GFR, Est African American: 84 mL/min
GFR, Est Non African American: 73 mL/min
Glucose, Bld: 104 mg/dL — ABNORMAL HIGH (ref 70–99)
Potassium: 4.1 mEq/L (ref 3.5–5.3)
Sodium: 141 mEq/L (ref 135–145)

## 2014-07-12 LAB — MICROALBUMIN / CREATININE URINE RATIO
Creatinine, Urine: 277.4 mg/dL
Microalb Creat Ratio: 8.7 mg/g (ref 0.0–30.0)
Microalb, Ur: 2.4 mg/dL — ABNORMAL HIGH (ref <2.0)

## 2014-07-12 LAB — INSULIN, FASTING: Insulin fasting, serum: 5.9 u[IU]/mL (ref 2.0–19.6)

## 2014-07-12 LAB — LIPID PANEL
Cholesterol: 201 mg/dL — ABNORMAL HIGH (ref 0–200)
HDL: 42 mg/dL (ref >39)
LDL Cholesterol: 130 mg/dL — ABNORMAL HIGH (ref 0–99)
Total CHOL/HDL Ratio: 4.8 Ratio
Triglycerides: 143 mg/dL (ref <150)
VLDL: 29 mg/dL (ref 0–40)

## 2014-07-12 LAB — TSH: TSH: 2.443 u[IU]/mL (ref 0.350–4.500)

## 2014-07-12 LAB — VITAMIN D 25 HYDROXY (VIT D DEFICIENCY, FRACTURES): Vit D, 25-Hydroxy: 79 ng/mL (ref 30–100)

## 2014-07-18 ENCOUNTER — Telehealth: Payer: Self-pay | Admitting: Neurology

## 2014-07-18 NOTE — Telephone Encounter (Signed)
Please see phone note from 02/27/2014 for further information. We have addressed this before. What I had explained to the patient in the past is when she is established on CPAP or BiPAP therapy and the machine is working well for her, she does not necessarily need to establish care with a new sleep MD.   She was encouraged to follow-up with her own sleep doctor. If she no longer has a sleep doctor, she can be scheduled with me. As far as I can tell, she had a CPAP titration study in January 2013 which is under "media" tab scanned in which I reviewed. She was placed on BiPAP therapy.   Bottom line, Okay to schedule for new sleep evaluation. Bring BiPAP machine and please ask DME for download. thx

## 2014-07-18 NOTE — Telephone Encounter (Signed)
Referring doctor office is calling to schedule appointment with you to follow up with patient for sleep. I see the patient had a sleep study performed back in 2012 and she was following up with you for neurology. Could you please take a look at patient chart and let me know if its ok to schedule appointment so I can let the referring office know where we stand with the referral? Thanks

## 2014-07-23 ENCOUNTER — Other Ambulatory Visit: Payer: Self-pay | Admitting: Internal Medicine

## 2014-07-27 ENCOUNTER — Ambulatory Visit (INDEPENDENT_AMBULATORY_CARE_PROVIDER_SITE_OTHER): Payer: Medicare Other | Admitting: Physician Assistant

## 2014-07-27 ENCOUNTER — Encounter: Payer: Self-pay | Admitting: Physician Assistant

## 2014-07-27 VITALS — BP 122/78 | HR 76 | Temp 97.7°F | Resp 16 | Ht 64.0 in | Wt 185.0 lb

## 2014-07-27 DIAGNOSIS — M545 Low back pain, unspecified: Secondary | ICD-10-CM

## 2014-07-27 DIAGNOSIS — K50919 Crohn's disease, unspecified, with unspecified complications: Secondary | ICD-10-CM

## 2014-07-27 LAB — COMPREHENSIVE METABOLIC PANEL
ALT: 12 U/L (ref 0–35)
AST: 14 U/L (ref 0–37)
Albumin: 4.3 g/dL (ref 3.5–5.2)
Alkaline Phosphatase: 37 U/L — ABNORMAL LOW (ref 39–117)
BUN: 14 mg/dL (ref 6–23)
CO2: 30 mEq/L (ref 19–32)
Calcium: 9.4 mg/dL (ref 8.4–10.5)
Chloride: 101 mEq/L (ref 96–112)
Creat: 0.94 mg/dL (ref 0.50–1.10)
Glucose, Bld: 97 mg/dL (ref 70–99)
Potassium: 4.2 mEq/L (ref 3.5–5.3)
Sodium: 142 mEq/L (ref 135–145)
Total Bilirubin: 0.6 mg/dL (ref 0.2–1.2)
Total Protein: 6.9 g/dL (ref 6.0–8.3)

## 2014-07-27 LAB — CBC WITH DIFFERENTIAL/PLATELET
Basophils Absolute: 0.1 10*3/uL (ref 0.0–0.1)
Basophils Relative: 1 % (ref 0–1)
Eosinophils Absolute: 0.2 10*3/uL (ref 0.0–0.7)
Eosinophils Relative: 3 % (ref 0–5)
HCT: 45.8 % (ref 36.0–46.0)
Hemoglobin: 15.6 g/dL — ABNORMAL HIGH (ref 12.0–15.0)
Lymphocytes Relative: 22 % (ref 12–46)
Lymphs Abs: 1.6 10*3/uL (ref 0.7–4.0)
MCH: 30.6 pg (ref 26.0–34.0)
MCHC: 34.1 g/dL (ref 30.0–36.0)
MCV: 90 fL (ref 78.0–100.0)
MPV: 9 fL (ref 8.6–12.4)
Monocytes Absolute: 0.6 10*3/uL (ref 0.1–1.0)
Monocytes Relative: 8 % (ref 3–12)
Neutro Abs: 4.7 10*3/uL (ref 1.7–7.7)
Neutrophils Relative %: 66 % (ref 43–77)
Platelets: 297 10*3/uL (ref 150–400)
RBC: 5.09 MIL/uL (ref 3.87–5.11)
RDW: 13.8 % (ref 11.5–15.5)
WBC: 7.1 10*3/uL (ref 4.0–10.5)

## 2014-07-27 MED ORDER — PREDNISONE 20 MG PO TABS: ORAL_TABLET | ORAL | Status: DC

## 2014-07-27 NOTE — Patient Instructions (Signed)
Call Dr. Vertell Limber and call Dr. Earlean Shawl  Crohn Disease Crohn disease is a long-term (chronic) soreness and redness (inflammation) of the intestines (bowel). It can affect any portion of the digestive tract, from the mouth to the anus. It can also cause problems outside the digestive tract. Crohn disease is closely related to a disease called ulcerative colitis (together, these two diseases are called inflammatory bowel disease).  CAUSES  The cause of Crohn disease is not known. One Link Snuffer is that, in an easily affected person, the immune system is triggered to attack the body's own digestive tissue. Crohn disease runs in families. It seems to be more common in certain geographic areas and amongst certain races. There are no clear-cut dietary causes.  SYMPTOMS  Crohn disease can cause many different symptoms since it can affect many different parts of the body. Symptoms include:  Fatigue.  Weight loss.  Chronic diarrhea, sometime bloody.  Abdominal pain and cramps.  Fever.  Ulcers or canker sores in the mouth or rectum.  Anemia (low red blood cells).  Arthritis, skin problems, and eye problems may occur. Complications of Crohn disease can include:  Series of holes (perforation) of the bowel.  Portions of the intestines sticking to each other (adhesions).  Obstruction of the bowel.  Fistula formation, typically in the rectal area but also in other areas. A fistula is an opening between the bowels and the outside, or between the bowels and another organ.  A painful crack in the mucous membrane of the anus (rectal fissure). DIAGNOSIS  Your caregiver may suspect Crohn disease based on your symptoms and an exam. Blood tests may confirm that there is a problem. You may be asked to submit a stool specimen for examination. X-rays and CT scans may be necessary. Ultimately, the diagnosis is usually made after a procedure that uses a flexible tube that is inserted via your mouth or your anus. This  is done under sedation and is called either an upper endoscopy or colonoscopy. With these tests, the specialist can take tiny tissue samples and remove them from the inside of the bowel (biopsy). Examination of this biopsy tissue under a microscope can reveal Crohn disease as the cause of your symptoms. Due to the many different forms that Crohn disease can take, symptoms may be present for several years before a diagnosis is made. TREATMENT  Medications are often used to decrease inflammation and control the immune system. These include medicines related to aspirin, steroid medications, and newer and stronger medications to slow down the immune system. Some medications may be used as suppositories or enemas. A number of other medications are used or have been studied. Your caregiver will make specific recommendations. HOME CARE INSTRUCTIONS   Symptoms such as diarrhea can be controlled with medications. Avoid foods that have a laxative effect such as fresh fruit, vegetables, and dairy products. During flare-ups, you can rest your bowel by refraining from solid foods. Drink clear liquids frequently during the day. (Electrolyte or rehydrating fluids are best. Your caregiver can help you with suggestions.) Drink often to prevent loss of body fluids (dehydration). When diarrhea has cleared, eat small meals and more frequently. Avoid food additives and stimulants such as caffeine (coffee, tea, or chocolate). Enzyme supplements may help if you develop intolerance to a sugar in dairy products (lactose). Ask your caregiver or dietitian about specific dietary instructions.  Try to maintain a positive attitude. Learn relaxation techniques such as self-hypnosis, mental imaging, and muscle relaxation.  If possible, avoid  stresses which can aggravate your condition.  Exercise regularly.  Follow your diet.  Always get plenty of rest. SEEK MEDICAL CARE IF:   Your symptoms fail to improve after a week or two of  new treatment.  You experience continued weight loss.  You have ongoing cramps or loose bowels.  You develop a new skin rash, skin sores, or eye problems. SEEK IMMEDIATE MEDICAL CARE IF:   You have worsening of your symptoms or develop new symptoms.  You have a fever.  You develop bloody diarrhea.  You develop severe abdominal pain. MAKE SURE YOU:   Understand these instructions.  Will watch your condition.  Will get help right away if you are not doing well or get worse. Document Released: 03/26/2005 Document Revised: 10/31/2013 Document Reviewed: 02/22/2007 [facility] Patient Information 2015 [place], [place]. This information is not intended to replace advice given to you by your health care provider. Make sure you discuss any questions you have with your health care provider.

## 2014-07-27 NOTE — Progress Notes (Signed)
Subjective:    Patient ID: Crystal [name], female    DOB: 1951-06-09, 64 y.o.   MRN: [id number]  HPI 64 y.o. WF with history of Crohn's, GERD, HTN, FM, RLS presents with lower back pain and crohn flare. She has had two spinal surgery's with Dr. Vertell Limber in the past, sees him every 3 months.  She has complicated history of chronic pain, FM/OA and sees mutlple doctors for this, she currently complains of left sharp SI pain, no radiation, worse with movement, lying helps some, she has gotten an injection with Dr. Larna Daughters 04/28/2014, states this did not help much. She states she does not want to cover up the symptom but believes that it is from a bone spur and would like to see Dr. Vertell Limber to possible have this removed as a cause.  Having mouth dryness. She had crohn flare since this Thursday. She has been having suprapubic pain and lower back with diarrhea 6-7 a day, denies fever, chills, mucus/blood in stool.   Past Medical History  Diagnosis Date  . Restless leg syndrome   . OSA (obstructive sleep apnea)   . Fibromyalgia   . Crohn's   . GERD (gastroesophageal reflux disease)   . Rhinitis 06/25/2010  . Gait disorder 10/26/2012  . Hypertension   . Hyperlipidemia   . Elevated hemoglobin A1c   . Thyroid disease   . Hypothyroid   . Depression   . Vitamin D deficiency   . ASCVD (arteriosclerotic cardiovascular disease)    Current Outpatient Prescriptions on File Prior to Visit  Medication Sig Dispense Refill  . acyclovir (ZOVIRAX) 800 MG tablet take 1 tablet by mouth 3 times daily before meals and 2 at bedtime 50 tablet PRN  . bisoprolol-hydrochlorothiazide (ZIAC) 10-6.25 MG per tablet TAKE ONE TABLET BY MOUTH EVERY DAY FOR BLOOD PRESSURE  90 tablet 99  . buPROPion (WELLBUTRIN XL) 300 MG 24 hr tablet Take 1 tablet every morning for mood & focusing attention 90 tablet 99  . dexlansoprazole (DEXILANT) 60 MG capsule Take 1 capsule (60 mg total) by mouth daily. 30 capsule 3  . DULoxetine  (CYMBALTA) 60 MG capsule TAKE ONE CAPSULE BY MOUTH ONE TIME DAILY 90 capsule 0  . fenofibrate micronized (LOFIBRA) 134 MG capsule Take 1 capsule (134 mg total) by mouth daily before breakfast. 90 capsule 3  . gabapentin (NEURONTIN) 800 MG tablet TAKE ONE TABLET BY MOUTH FOUR TIMES DAILY  120 tablet 2  . levothyroxine (SYNTHROID, LEVOTHROID) 100 MCG tablet Take 1 tablet by mouth on tues, wed, thurs, sat, & sun. Take 1 & 1/2 tablet on mon & fri. 102 tablet 11  . LORazepam (ATIVAN) 2 MG tablet Take 1/2 to 1 tablet at bedtime as needed for sleep 30 tablet 5  . losartan (COZAAR) 50 MG tablet Take 1 tablet (50 mg total) by mouth daily. 30 tablet 3  . lubiprostone (AMITIZA) 24 MCG capsule Take 24 mcg by mouth 2 (two) times daily with a meal.    . metaxalone (SKELAXIN) 800 MG tablet Take 1 tablet (800 mg total) by mouth 3 (three) times daily. 270 tablet 99  . oxybutynin (DITROPAN) 5 MG tablet take 1 tablet twice up to three times a day 90 tablet PRN  . pilocarpine (SALAGEN) 5 MG tablet Take 1 tablet (5 mg total) by mouth 2 (two) times daily. For dry mouth 60 tablet 99  . rOPINIRole (REQUIP) 4 MG tablet take 1 tablet by mouth at bedtime for restless leg 90 tablet 1  .  Triamcinolone Acetonide (NASACORT ALLERGY 24HR NA) Place 1 spray into the nose 2 (two) times daily.    . Vitamin D, Ergocalciferol, (DRISDOL) 50000 UNITS CAPS capsule Take one capsule by mouth one time daily 30 capsule PRN   No current facility-administered medications on file prior to visit.    Review of Systems  Constitutional: Positive for fatigue. Negative for fever, chills, diaphoresis, activity change, appetite change and unexpected weight change.  HENT: Negative.   Respiratory: Negative.   Cardiovascular: Negative.   Gastrointestinal: Positive for abdominal pain, diarrhea, constipation and abdominal distention. Negative for nausea, vomiting, blood in stool, anal bleeding and rectal pain.  Genitourinary: Negative.    Musculoskeletal: Positive for myalgias, back pain, arthralgias, gait problem, neck pain and neck stiffness. Negative for joint swelling.  Skin: Negative.   Neurological: Negative.   Psychiatric/Behavioral: Negative.        Objective:   Physical Exam  Constitutional: She is oriented to person, place, and time. She appears well-developed and well-nourished. No distress.  HENT:  Head: Normocephalic and atraumatic.  Eyes: Conjunctivae are normal. Pupils are equal, round, and reactive to light.  Neck: Normal range of motion. Neck supple.  Cardiovascular: Normal rate and regular rhythm.   Pulmonary/Chest: Effort normal and breath sounds normal.  Abdominal: Soft. Bowel sounds are normal. She exhibits no distension and no mass. There is tenderness (LLQ). There is no rebound and no guarding.  Musculoskeletal:  Patient is able to ambulate well.   Gait is not  antalgic. Straight leg raising with dorsiflexion negative bilaterally for radicular symptoms. Sensory exam in the legs are normal.  Knee reflexes are normal Ankle reflexes are normal Strength is normal and symmetric in arms and legs. There is not SI tenderness to palpation.  There isparaspinal muscle spasm.   There is not midline tenderness.   ROM of spine with normal but painful flexion, extension, lateral range of motion to the right and left, and rotation to the right and left.    Lymphadenopathy:    She has no cervical adenopathy.  Neurological: She is alert and oriented to person, place, and time.  Skin: Skin is warm and dry.       Assessment & Plan:  Lower back pain- Prednisone taper- follow up Dr. Laurin Coder management- will not write any pain medications and explained that we are limited with what we are able to do with her chronic/complicated pain.  Crohn's flare- prendisone taper- call Dr. Earlean Shawl- no rebound tenderness- if worsening AB pain/fever chills go to ER.

## 2014-07-28 ENCOUNTER — Other Ambulatory Visit: Payer: Self-pay | Admitting: Gastroenterology

## 2014-07-31 ENCOUNTER — Other Ambulatory Visit: Payer: Self-pay | Admitting: Gastroenterology

## 2014-07-31 DIAGNOSIS — K50019 Crohn's disease of small intestine with unspecified complications: Secondary | ICD-10-CM

## 2014-08-03 ENCOUNTER — Telehealth: Payer: Self-pay | Admitting: Neurology

## 2014-08-03 NOTE — Telephone Encounter (Signed)
Called [facility](spoke with [place]) for recent download and they will contact patient to bring in machine before visit

## 2014-08-07 ENCOUNTER — Ambulatory Visit (INDEPENDENT_AMBULATORY_CARE_PROVIDER_SITE_OTHER): Payer: 59 | Admitting: Neurology

## 2014-08-07 ENCOUNTER — Encounter: Payer: Self-pay | Admitting: Neurology

## 2014-08-07 VITALS — BP 129/90 | HR 102 | Temp 97.9°F | Ht 63.0 in | Wt 187.0 lb

## 2014-08-07 DIAGNOSIS — Z9989 Dependence on other enabling machines and devices: Principal | ICD-10-CM

## 2014-08-07 DIAGNOSIS — G4733 Obstructive sleep apnea (adult) (pediatric): Secondary | ICD-10-CM

## 2014-08-07 DIAGNOSIS — R251 Tremor, unspecified: Secondary | ICD-10-CM

## 2014-08-07 DIAGNOSIS — G510 Bell's palsy: Secondary | ICD-10-CM

## 2014-08-07 NOTE — Progress Notes (Signed)
Subjective:    Patient ID: Crystal [name] is a 64 y.o. female.  HPI     Interim history:   [name] is a 64 year old right-handed woman with a complex underlying medical history of hypertension, hyperlipidemia, Crohn's disease, hypothyroidism, obstructive sleep apnea, right-sided TIA in March 2005 (left-sided weakness and numbness in the face lasting 40 minutes), depression, and a history of right Bell's palsy, chronic back pain, fibromyalgia, RLS, interstitial cystitis and cervical and lumbar degenerative disc disease (s/p spine surgery of the lumbar spine under Dr. Vertell Limber), who presents for evaluation of her sleep disorder, in particular to establish care for obstructive sleep apnea. The patient is unaccompanied today. I had previously seen her for her gait disorder. I last saw her on 09/13/2013, at which time she reported no further TIA type symptoms. She had a stable gait. I suggested a prn follow-up. She requested to establish care for her obstructive sleep apnea as she no longer has a sleep specialist.   Today, reports not being fully compliant with her BiPAP machine lately. She has a nose mask, but it is not very comfortable. She She had seen ENT in 10-Aug-2012 and saw Dr. Lucia Gaskins. Her DME company is [employer]. She wakes up during the night. She gets up to use the bathroom once per night, usually around 5 AM. She sees Pain management. She gets injections into her shoulders. She was on a steroid taper, but finished it. She had dry needling with PT recently.   I reviewed today her compliance data from 05/09/2014 through 08/06/2014 which is a total of 90 days during which time she used her machine only 39 days with percent used days greater than 4 hours of 37%, indicating poor compliance. She is on AutoBiPAP with a maximum IPAP of 19, minimum EPAP of 5, pressure support of 4. Residual AHI is low at 0.9 per hour, leak acceptable with the 95th percentile at 13.3 L/m. Average usage for all days of only 2 hours  and 16 minutes.   I saw her on 04/28/2013, at which time I felt her physical exam was stable and she was advised to get started on baby aspirin for possible recent TIA with transient right eye vision loss. I requested echocardiogram, carotid Doppler study and repeat brain MRI and referred her to ENT. She was encouraged to discuss her stress and depression symptoms with her primary care physician. She was also encouraged to restart psychotherapy. We talked about secondary stroke prevention and her multifactorial gait disorder. Her carotid Doppler study from 05/24/2013 was unremarkable. Her brain MRI without contrast from 05/11/2013 was reported as normal without change from 08-10-12. In addition I personally reviewed the images through the PACS system. Her echocardiogram from 05/16/2013 showed an EF of 50-55%, mild mitral regurgitation, trivial tricuspid regurgitation, mild left atrial dilatation, and otherwise normal findings. We called her with all her test results.   I first met her on 10/26/2012, and which time I felt she had tremors as well as a multifactorial gait disorder and I suggested no new medications at the time but reinforced the need for treatment of her sleep apnea and we talked about general cardiovascular risk reduction. In 9/14, she had sudden onset of R eye vision loss for a about 40 minutes. She saw an eye doctor, and was told she may have glaucoma, and she has astigmatism. She has had orthostatic hypotension before. She endorsed stress and lost her son in 08-10-10, her mother died in 08-11-2011, and her father  died on 04/14/13. She was tearful. She had counseling in the past. She had PT per ortho and per PCP. She has had vertigo.   She has previously been seen by Dr. Erling Cruz and was last seen by him on 07/05/2012 for her history of balance difficulties. At the time of her last visit he suggested a repeat MRI and MRA of the brain. MRI and MRA of the brain did not show any acute or new abnormalities  and changes were not much progressed from 2005.   She has had bilateral hand tremors and a family history of tremors and degenerative disc disease as well as obstructive sleep apnea on BiPAP and was on CPAP in the past, and is status post left knee surgery in 2010.   She fell in March 3/14 and went to [facility] and was Dx with food poisoning, and she had L leg pain and had X ray of the L leg. She has a history of dream enactments and was told that she has PLMs. She uses a nasal mask, but her residual R facial weakness makes it hard for her to use the mask and she is not closing her eye. She has been on ASA 81 mg since her last presumed TIA in 12/13. She has ongoing problems with her L shoulder and had 2 surgeries on it.   Her Past Medical History Is Significant For: Past Medical History  Diagnosis Date  . Restless leg syndrome   . OSA (obstructive sleep apnea)   . Fibromyalgia   . Crohn's   . GERD (gastroesophageal reflux disease)   . Rhinitis 06/25/2010  . Gait disorder 10/26/2012  . Hypertension   . Hyperlipidemia   . Elevated hemoglobin A1c   . Thyroid disease   . Hypothyroid   . Depression   . Vitamin D deficiency   . ASCVD (arteriosclerotic cardiovascular disease)     Her Past Surgical History Is Significant For: Past Surgical History  Procedure Laterality Date  . Tonsillectomy    . Knee susrgery    . Shoulder adhesion release    . Spine surgery    . Appendectomy  1960  . Tonsillectomy  1960  . Cesarean section  [place]  . Abdominal hysterectomy  1992  . Rotator cuff repair Left 2005    Her Family History Is Significant For: Family History  Problem Relation Age of Onset  . Cancer Sister     uterus  . Depression Sister   . Heart disease Father   . Hypertension Father   . Hyperlipidemia Father   . Cancer Mother   . Diabetes Mother   . Osteoporosis Mother   . Leukemia Mother   . Leukemia Sister   . Lupus Sister     Her Social History Is Significant For: History     Social History  . Marital Status: Married    Spouse Name: N/A    Number of Children: 2  . Years of Education: N/A   Occupational History  . Retired Education officer, museum    Social History Main Topics  . Smoking status: Never Smoker   . Smokeless tobacco: None  . Alcohol Use: Yes     Comment: rarely  . Drug Use: None  . Sexual Activity: None   Other Topics Concern  . None   Social History Narrative    Her Allergies Are:  Allergies  Allergen Reactions  . Amitriptyline   . Atorvastatin   . Bio-Flax   . Codeine   .  Erythromycin   . Fish Oil   . Flax Seed [Bio-Flax]   . Linzess [Linaclotide]   . Morphine   . Nsaids   . Nuvigil [Armodafinil]   . Pravastatin   . Sinemet [Carbidopa W-Levodopa]   . Statins   . Sulfonamide Derivatives   . Zoloft [Sertraline Hcl]   :   Her Current Medications Are:  Outpatient Encounter Prescriptions as of 08/07/2014  Medication Sig  . acyclovir (ZOVIRAX) 800 MG tablet take 1 tablet by mouth 3 times daily before meals and 2 at bedtime  . bisoprolol-hydrochlorothiazide (ZIAC) 10-6.25 MG per tablet TAKE ONE TABLET BY MOUTH EVERY DAY FOR BLOOD PRESSURE   . buprenorphine (BUTRANS - DOSED MCG/HR) 10 MCG/HR PTWK patch Place 10 mcg onto the skin once a week.  [name] buPROPion (WELLBUTRIN XL) 300 MG 24 hr tablet Take 1 tablet every morning for mood & focusing attention  . dexlansoprazole (DEXILANT) 60 MG capsule Take 1 capsule (60 mg total) by mouth daily.  . DULoxetine (CYMBALTA) 60 MG capsule TAKE ONE CAPSULE BY MOUTH ONE TIME DAILY  . fenofibrate micronized (LOFIBRA) 134 MG capsule Take 1 capsule (134 mg total) by mouth daily before breakfast.  . gabapentin (NEURONTIN) 800 MG tablet TAKE ONE TABLET BY MOUTH FOUR TIMES DAILY   . levothyroxine (SYNTHROID, LEVOTHROID) 100 MCG tablet Take 1 tablet by mouth on tues, wed, thurs, sat, & sun. Take 1 & 1/2 tablet on mon & fri.  [name] LORazepam (ATIVAN) 2 MG tablet Take 1/2 to 1 tablet at bedtime as needed for sleep   . losartan (COZAAR) 50 MG tablet Take 1 tablet (50 mg total) by mouth daily.  [name] lubiprostone (AMITIZA) 24 MCG capsule Take 24 mcg by mouth 2 (two) times daily with a meal.  . metaxalone (SKELAXIN) 800 MG tablet Take 1 tablet (800 mg total) by mouth 3 (three) times daily.  [name] oxybutynin (DITROPAN) 5 MG tablet take 1 tablet twice up to three times a day  . pilocarpine (SALAGEN) 5 MG tablet Take 1 tablet (5 mg total) by mouth 2 (two) times daily. For dry mouth  . rOPINIRole (REQUIP) 4 MG tablet take 1 tablet by mouth at bedtime for restless leg  . TRAMADOL HCL PO Take by mouth as needed.  . Triamcinolone Acetonide (NASACORT ALLERGY 24HR NA) Place 1 spray into the nose 2 (two) times daily.  . Vitamin D, Ergocalciferol, (DRISDOL) 50000 UNITS CAPS capsule Take one capsule by mouth one time daily  . [DISCONTINUED] predniSONE (DELTASONE) 20 MG tablet 3 tablets daily with food for 3 days, 2 tabs daily for 3 days, 1 tab a day for 5 days. (Patient not taking: Reported on 08/07/2014)  :  Review of Systems:  Out of a complete 14 point review of systems, all are reviewed and negative with the exception of these symptoms as listed below:   Review of Systems Abdominal pain, finished on steroids, back pain, shoulder pain.   Objective:  Neurologic Exam  Physical Exam Physical Examination:   Filed Vitals:   08/07/14 1335  BP: 129/90  Pulse: 102  Temp: 97.9 F (36.6 C)    General Examination: The patient is a very pleasant 64 y.o. female in no acute distress. She appears well-developed and well-nourished and well groomed. She is overweight.   HEENT: Normocephalic, atraumatic, pupils are equal, round and reactive to light and accommodation. Funduscopy is normal b/l. Extraocular tracking is good without limitation to gaze excursion or nystagmus noted. Normal smooth pursuit is noted. Hearing  is grossly intact. Face is asymmetric with chronic R facial weakness and mild R sided hemifacial spasm. Speech is  clear with no dysarthria noted. There is no hypophonia. There is no lip, neck or jaw tremor. Neck is supple with full range of motion. There are no carotid bruits on auscultation. Oropharynx exam reveals: adequate dental hygiene and moderate airway crowding, due to redundant soft palate. Mallampati is class II. Tongue protrudes centrally and palate elevates symmetrically. There is moderate mouth dryness. She has a small nasal anatomy with small nostrils.  Chest: Clear to auscultation without wheezing, rhonchi or crackles noted.  Heart: S1+S2+0, regular and normal without murmurs, rubs or gallops noted.   Abdomen: Soft, non-tender and non-distended with normal bowel sounds appreciated on auscultation.  Extremities: There is no pitting edema in the distal lower extremities bilaterally. Pedal pulses are intact. Mild puffiness is noted around the ankles bilaterally.  Skin: Warm and dry without trophic changes noted. There are no varicose veins.   Musculoskeletal: exam reveals no obvious joint deformities, tenderness or joint swelling or erythema, but changes c/w arthritis in her hands are noted.    Neurologically:  Mental status: The patient is awake, alert and oriented in all 4 spheres. Her memory, attention, language and knowledge are appropriate. There is no aphasia, agnosia, apraxia or anomia. Speech is clear with normal prosody and enunciation. Thought process is linear. Mood is congruent and affect is normal.  Cranial nerves are as described above under HEENT exam. In addition, shoulder shrug is normal with equal shoulder height noted. Motor exam: Normal bulk, strength and tone is noted. There is no drift, or rebound. She has a very mild UE postural and action tremor, no resting tremor. Romberg shows minimal sway. Reflexes are 1+ throughout. Fine motor skills are fairly well intact with normal finger taps, normal hand movements, normal rapid alternating patting, normal foot taps and normal foot  agility.   Cerebellar testing shows no dysmetria or intention tremor on finger to nose testing. Heel to shin is unremarkable bilaterally. There is no truncal or gait ataxia.   Sensory exam is intact to light touch, pinprick, vibration, temperature sense in the upper and lower extremities.  Gait, station and balance: cautious gait. No leaning to one side is noted. Posture is age-appropriate and stance is narrow based. No problems turning are noted. She has some trouble with tandem walk.   Assessment and Plan:   In summary, SHANIYA TASHIRO is a very pleasant 64 year old female with a complex underlying medical history of hypertension, hyperlipidemia, Crohn's disease, hypothyroidism, obstructive sleep apnea, right-sided TIA in March 2005 (left-sided weakness and numbness in the face lasting 40 minutes), depression, and a history of right Bell's palsy, chronic back pain, fibromyalgia, RLS, interstitial cystitis and cervical and lumbar degenerative disc disease (s/p spine surgery of the lumbar spine under Dr. Vertell Limber), who presents for evaluation of her sleep disorder, in particular to establish care for obstructive sleep apnea. We discussed her sleep apnea diagnosis. She has been on AutoBiPAP, with suboptimal to poor compliance. She has some tolerance issues of her nasal mask. Her AHI is less than 1 per hour. Leak is acceptable. I reviewed her compliance data with her and explained the numbers. I would like for her to get refitted for the different nose mask. I provided an order for this and we will send this to her DME company, [facility]. I asked her to be fully compliant with BiPAP therapy. I will see her back  routinely in 3-4 months. Her physical exam is stable from last time.

## 2014-08-07 NOTE — Patient Instructions (Signed)
We will continue with you autoBiPAP, but try to get a better fitting nasal mask for you.

## 2014-08-14 ENCOUNTER — Other Ambulatory Visit: Payer: Self-pay

## 2014-08-15 ENCOUNTER — Other Ambulatory Visit: Payer: Self-pay

## 2014-08-22 ENCOUNTER — Other Ambulatory Visit: Payer: Self-pay | Admitting: Physician Assistant

## 2014-08-25 ENCOUNTER — Ambulatory Visit
Admission: RE | Admit: 2014-08-25 | Discharge: 2014-08-25 | Disposition: A | Discharge status: Home / Self Care | Payer: Medicare Other | Source: Ambulatory Visit | Attending: Gastroenterology | Admitting: Gastroenterology

## 2014-08-25 DIAGNOSIS — K50019 Crohn's disease of small intestine with unspecified complications: Secondary | ICD-10-CM

## 2014-08-25 MED ORDER — GADOBENATE DIMEGLUMINE 529 MG/ML IV SOLN
17.0000 mL | Freq: Once | INTRAVENOUS | Status: AC | PRN
  Administered 2014-08-25: 17 mL via INTRAVENOUS

## 2014-09-05 ENCOUNTER — Ambulatory Visit (INDEPENDENT_AMBULATORY_CARE_PROVIDER_SITE_OTHER): Payer: Medicare Other | Admitting: Internal Medicine

## 2014-09-05 ENCOUNTER — Encounter: Payer: Self-pay | Admitting: Internal Medicine

## 2014-09-05 VITALS — BP 130/76 | HR 88 | Temp 98.0°F | Resp 16 | Ht 64.0 in | Wt 185.0 lb

## 2014-09-05 DIAGNOSIS — J0140 Acute pansinusitis, unspecified: Secondary | ICD-10-CM

## 2014-09-05 MED ORDER — AZITHROMYCIN 250 MG PO TABS: ORAL_TABLET | ORAL | Status: DC

## 2014-09-05 MED ORDER — PROCHLORPERAZINE MALEATE 5 MG PO TABS: 5.0000 mg | ORAL_TABLET | Freq: Four times a day (QID) | ORAL | Status: DC | PRN

## 2014-09-05 NOTE — Patient Instructions (Signed)

## 2014-09-05 NOTE — Progress Notes (Signed)
Patient ID: Crystal [name], female   DOB: 1951-04-15, 64 y.o.   MRN: [id number]  HPI  Patient presents to the office for evaluation of cough.  It has been going on for 4 days.  Patient reports dry cough that is worse at night.  They also endorse change in voice, postnasal drip and sinus pressure, yellow green nasal sputum, congestion, left ear pain.  .  They have tried drinking fluids and resting.  They report that nothing has worked.  They denies other sick contacts.  Review of Systems  Constitutional: Negative for fever, chills, malaise/fatigue and diaphoresis.  HENT: Positive for congestion and ear pain. Negative for ear discharge, sore throat and tinnitus.        Sinus pressure  Eyes: Negative.   Respiratory: Positive for cough. Negative for sputum production, shortness of breath, wheezing and stridor.   Cardiovascular: Negative.   Gastrointestinal: Positive for nausea.  Genitourinary: Negative.   Musculoskeletal: Negative.   Neurological: Positive for headaches.    PE:  Filed Vitals:   09/05/14 1103  BP: 130/76  Pulse: 88  Temp: 98 F (36.7 C)  Resp: 16    General:  Alert and non-toxic, WDWN, NAD HEENT: NCAT, PERLA, EOM normal, no occular discharge or erythema.  Nasal mucosal edema with sinus tenderness to palpation.  Oropharynx clear with minimal oropharyngeal edema and erythema.  Mucous membranes moist and pink. Neck:  Cervical adenopathy Chest:  RRR no MRGs.  Lungs clear to auscultation A&P with no wheezes rhonchi or rales.   Abdomen: +BS x 4 quadrants, soft, non-tender, no guarding, rigidity, or rebound. Skin: warm and dry no rash Neuro: A&Ox4, CN II-XII grossly intact  Assessment and Plan:   1. Acute pansinusitis, recurrence not specified Patient is a 64 y.o. Who presents with URI symptoms and sinus pressure.  History and exam consistent with sinusitis.  Suspect that this is likely viral.  Have spoke to her about symptomatic care and use of nasal saline and  mucinex.  Will give Z pack with 1 refill.  Patient also requesting compazine for trip coming up.  Prescription given.  Patient has scheduled f/u appointment.  She is to call if no better after 1 zpack.  Patient states understanding.

## 2014-09-14 ENCOUNTER — Encounter: Payer: Self-pay | Admitting: Gastroenterology

## 2014-09-25 ENCOUNTER — Other Ambulatory Visit: Payer: Self-pay | Admitting: Physician Assistant

## 2014-10-06 ENCOUNTER — Other Ambulatory Visit: Payer: Self-pay | Admitting: Internal Medicine

## 2014-10-06 ENCOUNTER — Telehealth: Payer: Self-pay | Admitting: *Deleted

## 2014-10-06 NOTE — Telephone Encounter (Signed)
Patient called and states she only takes Wellbutrin in spring around the time of her son's death.  She asked how to wean off the drug later this spring.  Per Dr Oneta Rack, take 1 tab every other day x 2 weeks and then stop med.  Patient aware.

## 2014-10-31 ENCOUNTER — Encounter: Payer: Self-pay | Admitting: Physician Assistant

## 2014-10-31 ENCOUNTER — Ambulatory Visit (INDEPENDENT_AMBULATORY_CARE_PROVIDER_SITE_OTHER): Payer: Medicare Other | Admitting: Physician Assistant

## 2014-10-31 ENCOUNTER — Ambulatory Visit (HOSPITAL_COMMUNITY)
Admission: RE | Admit: 2014-10-31 | Discharge: 2014-10-31 | Disposition: A | Discharge status: Home / Self Care | Payer: Medicare Other | Source: Ambulatory Visit | Attending: Physician Assistant | Admitting: Physician Assistant

## 2014-10-31 ENCOUNTER — Ambulatory Visit: Payer: Self-pay | Admitting: Physician Assistant

## 2014-10-31 VITALS — BP 122/72 | HR 72 | Temp 97.7°F | Resp 16 | Wt 184.0 lb

## 2014-10-31 DIAGNOSIS — E559 Vitamin D deficiency, unspecified: Secondary | ICD-10-CM

## 2014-10-31 DIAGNOSIS — I10 Essential (primary) hypertension: Secondary | ICD-10-CM

## 2014-10-31 DIAGNOSIS — R7309 Other abnormal glucose: Secondary | ICD-10-CM

## 2014-10-31 DIAGNOSIS — E669 Obesity, unspecified: Secondary | ICD-10-CM | POA: Insufficient documentation

## 2014-10-31 DIAGNOSIS — M79641 Pain in right hand: Secondary | ICD-10-CM | POA: Diagnosis not present

## 2014-10-31 DIAGNOSIS — E66811 Obesity, class 1: Secondary | ICD-10-CM | POA: Insufficient documentation

## 2014-10-31 DIAGNOSIS — M7989 Other specified soft tissue disorders: Secondary | ICD-10-CM | POA: Insufficient documentation

## 2014-10-31 DIAGNOSIS — I2510 Atherosclerotic heart disease of native coronary artery without angina pectoris: Secondary | ICD-10-CM

## 2014-10-31 DIAGNOSIS — E785 Hyperlipidemia, unspecified: Secondary | ICD-10-CM

## 2014-10-31 DIAGNOSIS — E039 Hypothyroidism, unspecified: Secondary | ICD-10-CM

## 2014-10-31 DIAGNOSIS — Z79899 Other long term (current) drug therapy: Secondary | ICD-10-CM

## 2014-10-31 LAB — BASIC METABOLIC PANEL WITH GFR
BUN: 12 mg/dL (ref 6–23)
CO2: 31 mEq/L (ref 19–32)
Calcium: 9.2 mg/dL (ref 8.4–10.5)
Chloride: 103 mEq/L (ref 96–112)
Creat: 0.9 mg/dL (ref 0.50–1.10)
GFR, Est African American: 77 mL/min
GFR, Est Non African American: 67 mL/min
Glucose, Bld: 148 mg/dL — ABNORMAL HIGH (ref 70–99)
Potassium: 3.8 mEq/L (ref 3.5–5.3)
Sodium: 143 mEq/L (ref 135–145)

## 2014-10-31 LAB — MAGNESIUM: Magnesium: 1.8 mg/dL (ref 1.5–2.5)

## 2014-10-31 LAB — HEPATIC FUNCTION PANEL
ALT: 11 U/L (ref 0–35)
AST: 15 U/L (ref 0–37)
Albumin: 4 g/dL (ref 3.5–5.2)
Alkaline Phosphatase: 30 U/L — ABNORMAL LOW (ref 39–117)
Bilirubin, Direct: 0.1 mg/dL (ref 0.0–0.3)
Indirect Bilirubin: 0.4 mg/dL (ref 0.2–1.2)
Total Bilirubin: 0.5 mg/dL (ref 0.2–1.2)
Total Protein: 6.4 g/dL (ref 6.0–8.3)

## 2014-10-31 LAB — TSH: TSH: 1.406 u[IU]/mL (ref 0.350–4.500)

## 2014-10-31 LAB — LIPID PANEL
Cholesterol: 180 mg/dL (ref 0–200)
HDL: 34 mg/dL — ABNORMAL LOW (ref >=46)
LDL Cholesterol: 103 mg/dL — ABNORMAL HIGH (ref 0–99)
Total CHOL/HDL Ratio: 5.3 Ratio
Triglycerides: 214 mg/dL — ABNORMAL HIGH (ref <150)
VLDL: 43 mg/dL — ABNORMAL HIGH (ref 0–40)

## 2014-10-31 NOTE — Patient Instructions (Signed)
GET ON YOUR BIPAP REGULARLY!!!!!!!!!!   Diabetes is a very complicated disease...lets simplify it.  An easy way to look at it to understand the complications is if you think of the extra sugar floating in your blood stream as glass shards floating through your blood stream.    Diabetes affects your small vessels first: 1) The glass shards (sugar) scraps down the tiny blood vessels in your eyes and lead to diabetic retinopathy, the leading cause of blindness in the [place]. Diabetes is the leading cause of newly diagnosed adult (40 to 66 years of age) blindness in the [place].  2) The glass shards scratches down the tiny vessels of your legs leading to nerve damage called neuropathy and can lead to amputations of your feet. More than 60% of all non-traumatic amputations of lower limbs occur in people with diabetes.  3) Over time the small vessels in your brain are shredded and closed off, individually this does not cause any problems but over a long period of time many of the small vessels being blocked can lead to Vascular Dementia.   4) Your kidney's are a filter system and have a "net" that keeps certain things in the body and lets bad things out. Sugar shreds this net and leads to kidney damage and eventually failure. Decreasing the sugar that is destroying the net and certain blood pressure medications can help stop or decrease progression of kidney disease. Diabetes was the primary cause of kidney failure in 44 percent of all new cases in 2011.  5) Diabetes also destroys the small vessels in your penis that lead to erectile dysfunction. Eventually the vessels are so damaged that you may not be responsive to cialis or viagra.   Diabetes and your large vessels: Your larger vessels consist of your coronary arteries in your heart and the carotid vessels to your brain. Diabetes or even increased sugars put you at 300% increased risk of heart attack and stroke and this is why.. The sugar scrapes  down your large blood vessels and your body sees this as an internal injury and tries to repair itself. Just like you get a scab on your skin, your platelets will stick to the blood vessel wall trying to heal it. This is why we have diabetics on low dose aspirin daily, this prevents the platelets from sticking and can prevent plaque formation. In addition, your body takes cholesterol and tries to shove it into the open wound. This is why we want your LDL, or bad cholesterol, below 70.   The combination of platelets and cholesterol over 5-10 years forms plaque that can break off and cause a heart attack or stroke.   PLEASE REMEMBER:  Diabetes is preventable! Up to 71 percent of complications and morbidities among individuals with type 2 diabetes can be prevented, delayed, or effectively treated and minimized with regular visits to a health professional, appropriate monitoring and medication, and a healthy diet and lifestyle.     Bad carbs also include fruit juice, alcohol, and sweet tea. These are empty calories that do not signal to your brain that you are full.   Please remember the good carbs are still carbs which convert into sugar. So please measure them out no more than 1/2-1 cup of rice, oatmeal, pasta, and beans  Veggies are however free foods! Pile them on.   Not all fruit is created equal. Please see the list below, the fruit at the bottom is higher in sugars than the fruit at  the top. Please avoid all dried fruits.      Dizziness Dizziness is a common problem. It is a feeling of unsteadiness or light-headedness. You may feel like you are about to faint. Dizziness can lead to injury if you stumble or fall. A person of any age group can suffer from dizziness, but dizziness is more common in older adults. CAUSES  Dizziness can be caused by many different things, including: 5. Middle ear problems. 6. Standing for too long. 7. Infections. 8. An allergic reaction. 9. Aging. 10. An  emotional response to something, such as the sight of blood. 11. Side effects of medicines. 12. Tiredness. 13. Problems with circulation or blood pressure. 14. Excessive use of alcohol or medicines, or illegal drug use. 15. Breathing too fast (hyperventilation). 16. An irregular heart rhythm (arrhythmia). 17. A low red blood cell count (anemia). 18. Pregnancy. 19. Vomiting, diarrhea, fever, or other illnesses that cause body fluid loss (dehydration). 20. Diseases or conditions such as Parkinson's disease, high blood pressure (hypertension), diabetes, and thyroid problems. 21. Exposure to extreme heat. DIAGNOSIS  Your health care provider will ask about your symptoms, perform a physical exam, and perform an electrocardiogram (ECG) to record the electrical activity of your heart. Your health care provider may also perform other heart or blood tests to determine the cause of your dizziness. These may include:  Transthoracic echocardiogram (TTE). During echocardiography, sound waves are used to evaluate how blood flows through your heart.  Transesophageal echocardiogram (TEE).  Cardiac monitoring. This allows your health care provider to monitor your heart rate and rhythm in real time.  Holter monitor. This is a portable device that records your heartbeat and can help diagnose heart arrhythmias. It allows your health care provider to track your heart activity for several days if needed.  Stress tests by exercise or by giving medicine that makes the heart beat faster. TREATMENT  Treatment of dizziness depends on the cause of your symptoms and can vary greatly. HOME CARE INSTRUCTIONS   Drink enough fluids to keep your urine clear or pale yellow. This is especially important in very hot weather. In older adults, it is also important in cold weather.  Take your medicine exactly as directed if your dizziness is caused by medicines. When taking blood pressure medicines, it is especially important  to get up slowly.  Rise slowly from chairs and steady yourself until you feel okay.  In the morning, first sit up on the side of the bed. When you feel okay, stand slowly while holding onto something until you know your balance is fine.  Move your legs often if you need to stand in one place for a long time. Tighten and relax your muscles in your legs while standing.  Have someone stay with you for 1-2 days if dizziness continues to be a problem. Do this until you feel you are well enough to stay alone. Have the person call your health care provider if he or she notices changes in you that are concerning.  Do not drive or use heavy machinery if you feel dizzy.  Do not drink alcohol. SEEK IMMEDIATE MEDICAL CARE IF:   Your dizziness or light-headedness gets worse.  You feel nauseous or vomit.  You have problems talking, walking, or using your arms, hands, or legs.  You feel weak.  You are not thinking clearly or you have trouble forming sentences. It may take a friend or family member to notice this.  You have chest pain, abdominal  pain, shortness of breath, or sweating.  Your vision changes.  You notice any bleeding.  You have side effects from medicine that seems to be getting worse rather than better. MAKE SURE YOU:   Understand these instructions.  Will watch your condition.  Will get help right away if you are not doing well or get worse. Document Released: 12/10/2000 Document Revised: 06/21/2013 Document Reviewed: 01/03/2011 [facility] Patient Information 2015 [place], [place]. This information is not intended to replace advice given to you by your health care provider. Make sure you discuss any questions you have with your health care provider.

## 2014-10-31 NOTE — Progress Notes (Signed)
Assessment and Plan:  1. Hypertension -Continue medication, monitor blood pressure at home. Continue DASH diet.  Reminder to go to the ER if any CP, SOB, nausea, dizziness, severe HA, changes vision/speech, left arm numbness and tingling and jaw pain.  2. Cholesterol -Continue diet and exercise. Check cholesterol.   3. PreDiabetes  -Continue diet and exercise. Check A1C  4. Vitamin D Def - check level and continue medications.   .5. Hypothyroidism- check TSH level, continue medications the same, reminded to take on an empty stomach 30-50mns before food.  Send results to [facility] and Dr. DPatrecia Pour 6. Right hand pain s/p fall Get xray rule out fracture 4th and 5th MCP's  7. Shoulder pain and back pain Follow up ortho  8. Imbalance Several medications can cause it as well as not using bipap, allergies, non compliance with TSH medication, sugar is poorly controlled at this time-normal neuro-  Continue bASA, check labs and get back on BiPAP, if all normal will start to decrease medications.   Continue diet and meds as discussed. Further disposition pending results of labs. Discussed med's effects and SE's.   Over 30 minutes of exam, counseling, chart review, and critical decision making was performed   HPI 64y.o. female  presents for 3 month follow up on hypertension, cholesterol, diabetes and vitamin D deficiency.   Her blood pressure has been controlled at home, today her BP is BP: 122/72 mmHg.  She does workout but has been very busy and has not been doing it regularly. She denies chest pain, shortness of breath, dizziness. She has been fatigued, followed up with Dr. [place]exene Alberts [place] [address]   She is not on cholesterol medication, on fenofibrate and denies myalgias. Her cholesterol is not at goal. The cholesterol was:   Lab Results  Component Value Date   CHOL 201* 07/11/2014   HDL 42 07/11/2014   LDLCALC 130* 07/11/2014   TRIG 143 07/11/2014   CHOLHDL 4.8 07/11/2014   She has been  working on diet and exercise for prediabetes ,  and denies  paresthesia of the feet, polydipsia, polyuria and visual disturbances. Last A1C was:  Lab Results  Component Value Date   HGBA1C 6.3* 07/11/2014   Patient is on Vitamin D supplement. Lab Results  Component Value Date   VD25OH 7301/05/2015   She is on thyroid medication but she is afraid that she may have not had it for a while.  Her medication was not changed last visit.   Lab Results  Component Value Date   TSH 2.443 07/11/2014  .  She has history of falling 3 times over the last month, and last week fell on pavement landing on right hand and hitting the corner of her right eye. She continues to have swelling, bruising and pain in her right hand. She has been having some hand shaking, feel nausea, and she also feels that her feet have been "slapping more" having abnormal sensation in her feet with swelling.   BMI is Body mass index is 31.57 kg/(m^2)., she is working on diet and exercise. Wt Readings from Last 3 Encounters:  10/31/14 184 lb (83.462 kg)  09/05/14 185 lb (83.915 kg)  08/07/14 187 lb (84.823 kg)    Current Medications:  Current Outpatient Prescriptions on File Prior to Visit  Medication Sig Dispense Refill  . acyclovir (ZOVIRAX) 800 MG tablet take 1 tablet by mouth 3 times daily before meals and 2 at bedtime 50 tablet PRN  . bisoprolol-hydrochlorothiazide ([facility] 10-6.25  MG per tablet TAKE ONE TABLET BY MOUTH EVERY DAY FOR BLOOD PRESSURE  90 tablet 99  . buprenorphine (BUTRANS - DOSED MCG/HR) 10 MCG/HR PTWK patch Place 10 mcg onto the skin once a week.    [name] buPROPion (WELLBUTRIN XL) 300 MG 24 hr tablet Take 1 tablet every morning for mood & focusing attention 90 tablet 99  . dexlansoprazole (DEXILANT) 60 MG capsule Take 1 capsule (60 mg total) by mouth daily. 30 capsule 3  . DULoxetine (CYMBALTA) 60 MG capsule TAKE ONE CAPSULE BY MOUTH ONE TIME DAILY 90 capsule 0  . fenofibrate micronized (LOFIBRA) 134 MG  capsule Take 1 capsule (134 mg total) by mouth daily before breakfast. 90 capsule 3  . fluticasone (FLONASE) 50 MCG/ACT nasal spray USE TWO SPRAYS IN EACH NOSTRIL DAILY AS NEEDED 16 g 99  . gabapentin (NEURONTIN) 800 MG tablet TAKE ONE TABLET BY MOUTH FOUR TIMES DAILY  120 tablet 1  . levothyroxine (SYNTHROID, LEVOTHROID) 100 MCG tablet Take 1 tablet by mouth on tues, wed, thurs, sat, & sun. Take 1 & 1/2 tablet on mon & fri. 102 tablet 11  . LORazepam (ATIVAN) 2 MG tablet Take 1/2 to 1 tablet at bedtime as needed for sleep 30 tablet 5  . losartan (COZAAR) 50 MG tablet TAKE ONE TABLET BY MOUTH ONE TIME DAILY  30 tablet 99  . lubiprostone (AMITIZA) 24 MCG capsule Take 24 mcg by mouth 2 (two) times daily with a meal.    . metaxalone (SKELAXIN) 800 MG tablet Take 1 tablet (800 mg total) by mouth 3 (three) times daily. 270 tablet 99  . oxybutynin (DITROPAN) 5 MG tablet take 1 tablet twice up to three times a day 90 tablet PRN  . prochlorperazine (COMPAZINE) 5 MG tablet Take 1 tablet (5 mg total) by mouth every 6 (six) hours as needed for nausea or vomiting. 30 tablet 0  . rOPINIRole (REQUIP) 4 MG tablet take 1 tablet by mouth at bedtime for restless leg 90 tablet 1  . TRAMADOL HCL PO Take by mouth as needed.    . Triamcinolone Acetonide (NASACORT ALLERGY 24HR NA) Place 1 spray into the nose 2 (two) times daily.    . Vitamin D, Ergocalciferol, (DRISDOL) 50000 UNITS CAPS capsule Take one capsule by mouth one time daily 30 capsule PRN   No current facility-administered medications on file prior to visit.   Medical History:  Past Medical History  Diagnosis Date  . Restless leg syndrome   . OSA (obstructive sleep apnea)   . Fibromyalgia   . Crohn's   . GERD (gastroesophageal reflux disease)   . Rhinitis 06/25/2010  . Gait disorder 10/26/2012  . Hypertension   . Hyperlipidemia   . Elevated hemoglobin A1c   . Thyroid disease   . Hypothyroid   . Depression   . Vitamin D deficiency   . ASCVD  (arteriosclerotic cardiovascular disease)    Allergies:  Allergies  Allergen Reactions  . Amitriptyline   . Atorvastatin   . Bio-Flax   . Codeine   . Erythromycin   . Fish Oil   . Flax Seed [Bio-Flax]   . Linzess [Linaclotide]   . Morphine   . Nsaids   . Nuvigil [Armodafinil]   . Pravastatin   . Sinemet [Carbidopa W-Levodopa]   . Statins   . Sulfonamide Derivatives   . Zoloft [Sertraline Hcl]      Review of Systems:  Review of Systems  Constitutional: Positive for malaise/fatigue (has not used BiPAP x  1-2 months). Negative for fever, chills, weight loss and diaphoresis.  HENT: Positive for tinnitus. Negative for congestion, ear discharge, ear pain, hearing loss, nosebleeds and sore throat.   Respiratory: Negative.  Negative for stridor.   Cardiovascular: Negative.   Gastrointestinal: Positive for abdominal pain, diarrhea and constipation. Negative for heartburn, nausea, vomiting, blood in stool and melena.       Following up with Dr. Earlean Shawl today  Musculoskeletal: Positive for joint pain (shoulder, right hand that she fell on) and falls.  Skin: Negative.   Neurological: Positive for dizziness. Negative for tingling, tremors, sensory change, speech change, focal weakness, seizures, loss of consciousness, weakness and headaches.  Psychiatric/Behavioral: Positive for depression and memory loss. Negative for suicidal ideas, hallucinations and substance abuse. The patient has insomnia. The patient is not nervous/anxious.     Family history- Review and unchanged Social history- Review and unchanged Physical Exam: BP 122/72 mmHg  Pulse 72  Temp(Src) 97.7 F (36.5 C)  Resp 16  Wt 184 lb (83.462 kg) Wt Readings from Last 3 Encounters:  10/31/14 184 lb (83.462 kg)  09/05/14 185 lb (83.915 kg)  08/07/14 187 lb (84.823 kg)   General Appearance: Well nourished, in no apparent distress. Eyes: PERRLA, EOMs, conjunctiva no swelling or erythema Sinuses: No Frontal/maxillary  tenderness ENT/Mouth: Ext aud canals clear, TMs without erythema, bulging. No erythema, swelling, or exudate on post pharynx.  Tonsils not swollen or erythematous. Hearing normal.  Neck: Supple, thyroid normal.  Respiratory: Respiratory effort normal, BS equal bilaterally without rales, rhonchi, wheezing or stridor.  Cardio: RRR with no MRGs. Brisk peripheral pulses without edema.  Abdomen: Soft, + BS.  Non tender, no guarding, rebound, hernias, masses. Lymphatics: Non tender without lymphadenopathy.  Musculoskeletal: Full ROM, 5/5 strength, Normal gait. She has swelling, ecchymosis at lateral right hand with pinpoint tenderness at 4th and 5th MCP.  Skin: Warm, dry without rashes, lesions, ecchymosis.  Neuro: Cranial nerves intact. No cerebellar symptoms. 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    Vicie Mutters, PA-C 11:42 AM [facility]

## 2014-11-01 LAB — CBC WITH DIFFERENTIAL/PLATELET
Basophils Absolute: 0.1 10*3/uL (ref 0.0–0.1)
Basophils Relative: 1 % (ref 0–1)
Eosinophils Absolute: 0.1 10*3/uL (ref 0.0–0.7)
Eosinophils Relative: 1 % (ref 0–5)
HCT: 42.3 % (ref 36.0–46.0)
Hemoglobin: 14.2 g/dL (ref 12.0–15.0)
Lymphocytes Relative: 21 % (ref 12–46)
Lymphs Abs: 1.3 10*3/uL (ref 0.7–4.0)
MCH: 30.6 pg (ref 26.0–34.0)
MCHC: 33.6 g/dL (ref 30.0–36.0)
MCV: 91.2 fL (ref 78.0–100.0)
MPV: 8.9 fL (ref 8.6–12.4)
Monocytes Absolute: 0.5 10*3/uL (ref 0.1–1.0)
Monocytes Relative: 8 % (ref 3–12)
Neutro Abs: 4.2 10*3/uL (ref 1.7–7.7)
Neutrophils Relative %: 69 % (ref 43–77)
Platelets: 282 10*3/uL (ref 150–400)
RBC: 4.64 MIL/uL (ref 3.87–5.11)
RDW: 13.8 % (ref 11.5–15.5)
WBC: 6.1 10*3/uL (ref 4.0–10.5)

## 2014-11-01 LAB — INSULIN, FASTING: Insulin fasting, serum: 25.2 u[IU]/mL — ABNORMAL HIGH (ref 2.0–19.6)

## 2014-11-01 LAB — HEMOGLOBIN A1C
Hgb A1c MFr Bld: 6.7 % — ABNORMAL HIGH (ref <5.7)
Mean Plasma Glucose: 146 mg/dL — ABNORMAL HIGH (ref <117)

## 2014-11-01 LAB — VITAMIN D 25 HYDROXY (VIT D DEFICIENCY, FRACTURES): Vit D, 25-Hydroxy: 57 ng/mL (ref 30–100)

## 2014-11-18 ENCOUNTER — Other Ambulatory Visit: Payer: Self-pay | Admitting: Internal Medicine

## 2014-11-20 NOTE — Progress Notes (Signed)
Called Rx into [facility] in [employer] [address]

## 2014-11-23 DIAGNOSIS — M12819 Other specific arthropathies, not elsewhere classified, unspecified shoulder: Secondary | ICD-10-CM | POA: Insufficient documentation

## 2014-11-30 ENCOUNTER — Telehealth: Payer: Self-pay

## 2014-11-30 ENCOUNTER — Other Ambulatory Visit: Payer: Self-pay

## 2014-11-30 MED ORDER — CITALOPRAM HYDROBROMIDE 20 MG PO TABS: 20.0000 mg | ORAL_TABLET | Freq: Every day | ORAL | Status: DC

## 2014-11-30 NOTE — Telephone Encounter (Signed)
Received paper note from front office staff, patient called to advise that Cymbalta no longer covered on her insurance plan, and will cost her over 200.oo, per Dr Oneta Rack , sent in Citalopram 20 mg, three times daily, patient aware

## 2014-12-01 ENCOUNTER — Encounter: Payer: Self-pay | Admitting: Internal Medicine

## 2014-12-01 ENCOUNTER — Ambulatory Visit (INDEPENDENT_AMBULATORY_CARE_PROVIDER_SITE_OTHER): Payer: Medicare Other | Admitting: Internal Medicine

## 2014-12-01 VITALS — BP 138/80 | HR 82 | Temp 98.2°F | Resp 18 | Ht 64.0 in | Wt 184.0 lb

## 2014-12-01 DIAGNOSIS — M797 Fibromyalgia: Secondary | ICD-10-CM

## 2014-12-01 DIAGNOSIS — J324 Chronic pansinusitis: Secondary | ICD-10-CM

## 2014-12-01 DIAGNOSIS — Z79899 Other long term (current) drug therapy: Secondary | ICD-10-CM

## 2014-12-01 MED ORDER — CYCLOBENZAPRINE HCL 10 MG PO TABS: 10.0000 mg | ORAL_TABLET | Freq: Three times a day (TID) | ORAL | Status: DC | PRN

## 2014-12-01 MED ORDER — CETIRIZINE HCL 10 MG PO TABS: 10.0000 mg | ORAL_TABLET | Freq: Every day | ORAL | Status: DC

## 2014-12-01 NOTE — Patient Instructions (Signed)

## 2014-12-01 NOTE — Progress Notes (Signed)
   Subjective:    Patient ID: Crystal [name], female    DOB: 1951-05-03, 64 y.o.   MRN: [id number]  Fall Pertinent negatives include no fever or headaches.  Patient reports to the office for evaluation of possible nasal cartilage defect.  She reports that she fell at the end of April and she hit her hand and also her face on the ground at the time.  She reports that she tried taking a zpak which did not relieve the swelling in her nose.  She reports that she feels something inside her nose.  She feels like she has a lot of inflammation in her nose and she has not been able to breath through her nose.  She reports that she did have a broken tooth and is now having to have a cap placed and have some cavities filled.  She has seen an ENT in the past.  She saw Dr. Ezzard Standing in the past.    Patient reports that she is going to Dr. Scarlette Slice this week to have injections in her neck.  She would like to have reverse total shoulder surgery and would like to also have some spinal fusion done by her neurosurgeon.    She sees her neurologist Dr. Pernell Dupre in 2 weeks to talk about her fibromyalgia and also her depression.  She wants to make sure that she can switch from her cymbalta and can go on citalopram.     Review of Systems  Constitutional: Negative for fever, chills and fatigue.  HENT: Positive for congestion, rhinorrhea and sinus pressure. Negative for postnasal drip, sneezing, sore throat and voice change.   Neurological: Negative for dizziness, light-headedness and headaches.  Psychiatric/Behavioral: Negative for confusion, decreased concentration and agitation.       Objective:   Physical Exam  Constitutional: She is oriented to person, place, and time. She appears well-developed and well-nourished. No distress.  HENT:  Head: Normocephalic and atraumatic.  Mouth/Throat: Oropharynx is clear and moist. No oropharyngeal exudate.  Eyes: Conjunctivae are normal. No scleral icterus.  Neck: Normal range  of motion. Neck supple. No JVD present. No thyromegaly present.  Cardiovascular: Normal rate, regular rhythm, normal heart sounds and intact distal pulses.  Exam reveals no gallop and no friction rub.   No murmur heard. Pulmonary/Chest: Effort normal and breath sounds normal. No respiratory distress. She has no wheezes. She has no rales. She exhibits no tenderness.  Abdominal: Soft. Bowel sounds are normal. She exhibits no distension and no mass. There is no tenderness. There is no rebound and no guarding.  Musculoskeletal: Normal range of motion.  Lymphadenopathy:    She has no cervical adenopathy.  Neurological: She is alert and oriented to person, place, and time.  Skin: Skin is warm and dry. She is not diaphoretic.  Psychiatric: She has a normal mood and affect. Her behavior is normal. Judgment and thought content normal.  Nursing note and vitals reviewed.          Assessment & Plan:    1. Chronic pansinusitis -exam of nose appeared benign.  NO specific abnormalities or swelling noted -prednisone - Ambulatory referral to ENT  2. Fibromyalgia -restart cymbalta.  Start with 1/2 tablet daily x 1 week.  Increase as tolerated based on symptoms.   -d/c citalopram -patient told to discuss further treatment with neuro  3. Medication management -d/c skalaxin -flexeril prescribed secondary to cost concerns

## 2014-12-04 ENCOUNTER — Other Ambulatory Visit: Payer: Self-pay | Admitting: Internal Medicine

## 2014-12-04 ENCOUNTER — Encounter: Payer: Self-pay | Admitting: Internal Medicine

## 2014-12-04 MED ORDER — DULOXETINE HCL 60 MG PO CPEP: 60.0000 mg | ORAL_CAPSULE | Freq: Every day | ORAL | Status: DC

## 2014-12-05 ENCOUNTER — Encounter: Payer: Self-pay | Admitting: Neurology

## 2014-12-06 ENCOUNTER — Encounter: Payer: Self-pay | Admitting: Neurology

## 2014-12-06 ENCOUNTER — Ambulatory Visit (INDEPENDENT_AMBULATORY_CARE_PROVIDER_SITE_OTHER): Payer: Medicare Other | Admitting: Neurology

## 2014-12-06 VITALS — BP 112/60 | HR 68 | Resp 16 | Ht 64.0 in | Wt 180.0 lb

## 2014-12-06 DIAGNOSIS — G4733 Obstructive sleep apnea (adult) (pediatric): Secondary | ICD-10-CM

## 2014-12-06 NOTE — Patient Instructions (Addendum)
Discuss with Dr. Ezzard Standing any alternatives to BiPAP for sleep apnea from his standpoint, also discuss with your dentist whether he provides oral appliances for obstructive sleep apnea, since you cannot tolerate your BiPAP.  I can see you back as needed.  Use a cane for safety or your walker.

## 2014-12-06 NOTE — Progress Notes (Signed)
Subjective:    Patient ID: Crystal [name] is a 65 y.o. female.  HPI     Interim history:   Crystal [name] is a 64 year old left-handed woman with a complex underlying medical history of hypertension, hyperlipidemia, Crohn's disease, hypothyroidism, obstructive sleep apnea, right-sided TIA in March 2005 (left-sided weakness and numbness in the face lasting 40 minutes), depression, and a history of right Bell's palsy, chronic back pain, fibromyalgia, RLS, interstitial cystitis and cervical and lumbar degenerative disc disease (s/p spine surgery of the lumbar spine under Dr. Vertell Limber), who presents for follow-up consultation of her obstructive sleep apnea. The patient is unaccompanied today. I had previously seen her for her gait disorder. I last saw her on 08/07/2014, at which time she admitted that she was not fully compliant with her BiPAP. She had tolerance issues with the mask. I prescribed a new nose mask. She was also complaining of shoulder pain. I suggested that she continue using her BiPAP regularly and provided her with an order for new supplies.   Today, 12/06/2014: I reviewed her BiPAP compliance data from 07/08/2014 through 08/06/2014 which is a total of 30 days during which time she used her machine only 16 days with percent used days greater than 4 hours at only 47%, indicating suboptimal compliance with an average usage for all days of only 2 hours and 44 minutes, residual AHI low at 0.8, leak acceptable. Pressure setting of maximum IPAP of 19 cm, minimum EPAP of 5 cm and pressure support of 4 cm.   Today, 12/06/2014: She reports that she is not able to use her machine. Her mask causes her facial pain. She feels that her fibromyalgia has flared up and therefore she cannot tolerate the pressure or the mask on her face. She fell in March. She saw her primary care physician. She has an appointment with ENT coming up. She also has been seeing her dentist. She is interested in pursuing an oral  appliance. She's not using a cane or walker. She has both available. She has not used her BiPAP machine in about 4 months. She has bilateral shoulder pain. She had 2 rotator cuff surgeries on the left and one on the right. She may need shoulder replacement surgery. She is seeing orthopedics at [facility]. She also sees the pain clinic.  Previously:   I saw her on 09/13/2013, at which time she reported no further TIA type symptoms. She had a stable gait. I suggested a prn follow-up. She requested to establish care for her obstructive sleep apnea as she no longer has a sleep specialist.   I reviewed today her compliance data from 05/09/2014 through 08/06/2014 which is a total of 90 days during which time she used her machine only 39 days with percent used days greater than 4 hours of 37%, indicating poor compliance. She is on AutoBiPAP with a maximum IPAP of 19, minimum EPAP of 5, pressure support of 4. Residual AHI is low at 0.9 per hour, leak acceptable with the 95th percentile at 13.3 L/m. Average usage for all days of only 2 hours and 16 minutes.   I saw her on 04/28/2013, at which time I felt her physical exam was stable and she was advised to get started on baby aspirin for possible recent TIA with transient right eye vision loss. I requested echocardiogram, carotid Doppler study and repeat brain MRI and referred her to ENT. She was encouraged to discuss her stress and depression symptoms with her primary care physician. She was  also encouraged to restart psychotherapy. We talked about secondary stroke prevention and her multifactorial gait disorder. Her carotid Doppler study from 05/24/2013 was unremarkable. Her brain MRI without contrast from 05/11/2013 was reported as normal without change from January 2014. In addition I personally reviewed the images through the PACS system. Her echocardiogram from 05/16/2013 showed an EF of 50-55%, mild mitral regurgitation, trivial tricuspid regurgitation, mild left  atrial dilatation, and otherwise normal findings. We called her with all her test results.   I first met her on 10/26/2012, and which time I felt she had tremors as well as a multifactorial gait disorder and I suggested no new medications at the time but reinforced the need for treatment of her sleep apnea and we talked about general cardiovascular risk reduction. In 9/14, she had sudden onset of R eye vision loss for a about 40 minutes. She saw an eye doctor, and was told she may have glaucoma, and she has astigmatism. She has had orthostatic hypotension before. She endorsed stress and lost her son in 08-25-2010, her mother died in 08/26/11, and her father died on 04/16/2013. She was tearful. She had counseling in the past. She had PT per ortho and per PCP. She has had vertigo.   She has previously been seen by Dr. Erling Cruz and was last seen by him on 07/05/2012 for her history of balance difficulties. At the time of her last visit he suggested a repeat MRI and MRA of the brain. MRI and MRA of the brain did not show any acute or new abnormalities and changes were not much progressed from 26-Aug-2003.   She has had bilateral hand tremors and a family history of tremors and degenerative disc disease as well as obstructive sleep apnea on BiPAP and was on CPAP in the past, and is status post left knee surgery in 08-25-08.   She fell in March 3/14 and went to [facility] and was Dx with food poisoning, and she had L leg pain and had X ray of the L leg. She has a history of dream enactments and was told that she has PLMs. She uses a nasal mask, but her residual R facial weakness makes it hard for her to use the mask and she is not closing her eye. She has been on ASA 81 mg since her last presumed TIA in 12/13. She has ongoing problems with her L shoulder and had 2 surgeries on it.   Her Past Medical History Is Significant For: Past Medical History  Diagnosis Date  . Restless leg syndrome   . OSA (obstructive sleep apnea)   . Fibromyalgia   .  Crohn's   . GERD (gastroesophageal reflux disease)   . Rhinitis 06/25/2010  . Gait disorder 10/26/2012  . Hypertension   . Hyperlipidemia   . Elevated hemoglobin A1c   . Thyroid disease   . Hypothyroid   . Depression   . Vitamin D deficiency   . ASCVD (arteriosclerotic cardiovascular disease)     Her Past Surgical History Is Significant For: Past Surgical History  Procedure Laterality Date  . Tonsillectomy    . Knee susrgery    . Shoulder adhesion release    . Spine surgery    . Appendectomy  1960  . Tonsillectomy  1960  . Cesarean section  [place]  . Abdominal hysterectomy  1992  . Rotator cuff repair Left 2003-08-26    Her Family History Is Significant For: Family History  Problem Relation Age of Onset  .  Cancer Sister     uterus  . Depression Sister   . Heart disease Father   . Hypertension Father   . Hyperlipidemia Father   . Cancer Mother   . Diabetes Mother   . Osteoporosis Mother   . Leukemia Mother   . Leukemia Sister   . Lupus Sister     Her Social History Is Significant For: History   Social History  . Marital Status: Married    Spouse Name: N/A  . Number of Children: 2  . Years of Education: N/A   Occupational History  . Retired Education officer, museum    Social History Main Topics  . Smoking status: Never Smoker   . Smokeless tobacco: Not on file  . Alcohol Use: 0.0 oz/week    0 Standard drinks or equivalent per week     Comment: rarely  . Drug Use: Not on file  . Sexual Activity: Not on file   Other Topics Concern  . None   Social History Narrative    Her Allergies Are:  Allergies  Allergen Reactions  . Amitriptyline   . Atorvastatin   . Bio-Flax   . Codeine   . Erythromycin   . Fish Oil   . Flax Seed [Bio-Flax]   . Linzess [Linaclotide]   . Morphine   . Nsaids   . Nuvigil [Armodafinil]   . Pravastatin   . Sinemet [Carbidopa W-Levodopa]   . Statins   . Sulfonamide Derivatives   . Zoloft [Sertraline Hcl]   :   Her Current  Medications Are:  Outpatient Encounter Prescriptions as of 12/06/2014  Medication Sig  . acyclovir (ZOVIRAX) 800 MG tablet take 1 tablet by mouth 3 times daily before meals and 2 at bedtime  . baclofen (LIORESAL) 20 MG tablet Take 20 mg by mouth 2 (two) times daily.  . bisoprolol-hydrochlorothiazide (ZIAC) 10-6.25 MG per tablet TAKE ONE TABLET BY MOUTH EVERY DAY FOR BLOOD PRESSURE   . buprenorphine (BUTRANS - DOSED MCG/HR) 10 MCG/HR PTWK patch Place 10 mcg onto the skin once a week.  . cetirizine (ZYRTEC) 10 MG tablet Take 1 tablet (10 mg total) by mouth at bedtime.  . cyclobenzaprine (FLEXERIL) 10 MG tablet Take 1 tablet (10 mg total) by mouth every 8 (eight) hours as needed for muscle spasms.  [name] dexlansoprazole (DEXILANT) 60 MG capsule Take 1 capsule (60 mg total) by mouth daily.  . DULoxetine (CYMBALTA) 60 MG capsule Take 1 capsule (60 mg total) by mouth daily.  . fenofibrate micronized (LOFIBRA) 134 MG capsule Take 1 capsule (134 mg total) by mouth daily before breakfast.  . fluticasone (FLONASE) 50 MCG/ACT nasal spray USE TWO SPRAYS IN EACH NOSTRIL DAILY AS NEEDED  . gabapentin (NEURONTIN) 800 MG tablet TAKE ONE TABLET BY MOUTH FOUR TIMES DAILY   . levothyroxine (SYNTHROID, LEVOTHROID) 100 MCG tablet Take 1 tablet by mouth on tues, wed, thurs, sat, & sun. Take 1 & 1/2 tablet on mon & fri.  . losartan (COZAAR) 50 MG tablet TAKE ONE TABLET BY MOUTH ONE TIME DAILY   . lubiprostone (AMITIZA) 24 MCG capsule Take 24 mcg by mouth 2 (two) times daily with a meal.  . oxybutynin (DITROPAN) 5 MG tablet take 1 tablet twice up to three times a day  . prochlorperazine (COMPAZINE) 5 MG tablet Take 1 tablet (5 mg total) by mouth every 6 (six) hours as needed for nausea or vomiting.  . ranitidine (ZANTAC) 150 MG tablet Take 150 mg by mouth at bedtime.  [name]  rOPINIRole (REQUIP) 4 MG tablet take 1 tablet by mouth at bedtime for restless leg  . TRAMADOL HCL PO Take by mouth as needed.  . Triamcinolone Acetonide  (NASACORT ALLERGY 24HR NA) Place 1 spray into the nose 2 (two) times daily.  . Vitamin D, Ergocalciferol, (DRISDOL) 50000 UNITS CAPS capsule Take one capsule by mouth one time daily  . [DISCONTINUED] buPROPion (WELLBUTRIN XL) 300 MG 24 hr tablet Take 1 tablet every morning for mood & focusing attention (Patient not taking: Reported on 12/01/2014)   No facility-administered encounter medications on file as of 12/06/2014.  :  Review of Systems:  Out of a complete 14 point review of systems, all are reviewed and negative with the exception of these symptoms as listed below:   Review of Systems  Neurological:       Patient feel like she cannot wear PAP mask due to fibromyalgia and recent fall.     Objective:  Neurologic Exam  Physical Exam Physical Examination:   Filed Vitals:   12/06/14 1158  BP: 112/60  Pulse: 68  Resp: 16    General Examination: The patient is a very pleasant 64 y.o. female in no acute distress. She appears well-developed and well-nourished and well groomed. She is overweight.   HEENT: Normocephalic, atraumatic, pupils are equal, round and reactive to light and accommodation. Funduscopy is normal b/l. Extraocular tracking is good without limitation to gaze excursion or nystagmus noted. Normal smooth pursuit is noted. Hearing is grossly intact. Face is asymmetric with chronic R facial weakness and mild R sided hemifacial spasm, both unchanged. Speech is clear with no dysarthria noted. There is no hypophonia. There is no lip, neck or jaw tremor. Neck is supple with full range of motion. There are no carotid bruits on auscultation. Oropharynx exam reveals: adequate dental hygiene and moderate airway crowding, due to redundant soft palate. Mallampati is class II. Tongue protrudes centrally and palate elevates symmetrically. There is moderate to severe mouth dryness. She has a small nasal anatomy with small nostrils, she has a deviated septum.  Chest: Clear to auscultation  without wheezing, rhonchi or crackles noted.  Heart: S1+S2+0, regular and normal without murmurs, rubs or gallops noted.   Abdomen: Soft, non-tender and non-distended with normal bowel sounds appreciated on auscultation.  Extremities: There is no pitting edema in the distal lower extremities bilaterally. Pedal pulses are intact. Mild puffiness is noted around the ankles bilaterally.  Skin: Warm and dry without trophic changes noted. There are no varicose veins.   Musculoskeletal: exam reveals no obvious joint deformities, tenderness or joint swelling or erythema, but changes c/w arthritis in her hands are noted.    Neurologically:  Mental status: The patient is awake, alert and oriented in all 4 spheres. Her memory, attention, language and knowledge are appropriate. There is no aphasia, agnosia, apraxia or anomia. Speech is clear with normal prosody and enunciation. Thought process is linear. Mood is congruent and affect is normal.  Cranial nerves are as described above under HEENT exam. In addition, shoulder shrug is normal with equal shoulder height noted. Motor exam: Normal bulk, strength and tone is noted. There is no drift, or rebound. She has a very mild UE postural and action tremor, no resting tremor. Romberg shows no sway. Reflexes are 1+ throughout. Fine motor skills are fairly well intact with normal finger taps, normal hand movements, normal rapid alternating patting, normal foot taps and normal foot agility.   Cerebellar testing shows no dysmetria or intention tremor on  finger to nose testing. Heel to shin is unremarkable bilaterally. There is no truncal or gait ataxia.   Sensory exam is intact to light touch.  Gait, station and balance: cautious gait. No leaning to one side is noted. Posture is age-appropriate and stance is narrow based. No problems turning are noted. She has some trouble with tandem walk, unchanged.   Assessment and Plan:   In summary, Crystal [name] is a  very pleasant 64 year old female with a complex underlying medical history of hypertension, hyperlipidemia, Crohn's disease, hypothyroidism, obstructive sleep apnea, right-sided TIA in March 2005 (left-sided weakness and numbness in the face lasting 40 minutes), depression, and a history of right Bell's palsy, chronic back pain, fibromyalgia, RLS, interstitial cystitis and cervical and lumbar degenerative disc disease (s/p spine surgery of the lumbar spine under Dr. Vertell Limber), shoulder pain bilaterally, status post rotator cuff surgeries on both sides, who presents for follow-up consultation of her obstructive sleep apnea. She has been on auto BiPAP but has had suboptimal to poor compliance. She has had issues with tolerance of her mask and pressure. Today, we talked about potential alternative treatments to BiPAP and she is encouraged to talk to her ENT physician as well as her dentist about alternatives from their end of things. She may be a candidate for an oral appliance. If her dentist does not provide those I can certainly make a referral for her to a specialist dentist that makes custom-made dental devices. She was in agreement. In the interim, she will try to use her BiPAP if possible. She is advised to use her cane or walker for safety. At this juncture, I can see her back on an as-needed basis. I spent 15 minutes in total face-to-face time with the patient, more than 50% of which was spent in counseling and coordination of care, reviewing test results, reviewing medication and discussing or reviewing the diagnosis of OSA, its prognosis and treatment options.

## 2014-12-07 ENCOUNTER — Telehealth: Payer: Self-pay | Admitting: Internal Medicine

## 2014-12-07 NOTE — Telephone Encounter (Signed)
Patient called with questions about taking cymbalta.  Patient sent mychart message on how to take it.

## 2014-12-18 ENCOUNTER — Other Ambulatory Visit: Payer: Self-pay | Admitting: Internal Medicine

## 2014-12-29 ENCOUNTER — Other Ambulatory Visit: Payer: Self-pay | Admitting: Physician Assistant

## 2015-01-02 ENCOUNTER — Other Ambulatory Visit: Payer: Self-pay | Admitting: Internal Medicine

## 2015-01-02 DIAGNOSIS — H60399 Other infective otitis externa, unspecified ear: Secondary | ICD-10-CM

## 2015-01-02 MED ORDER — NEOMYCIN-POLYMYXIN-HC 3.5-10000-1 OT SOLN: OTIC | Status: DC

## 2015-01-03 ENCOUNTER — Encounter: Payer: Self-pay | Admitting: Internal Medicine

## 2015-01-03 ENCOUNTER — Encounter (INDEPENDENT_AMBULATORY_CARE_PROVIDER_SITE_OTHER): Payer: Self-pay

## 2015-01-03 ENCOUNTER — Ambulatory Visit (INDEPENDENT_AMBULATORY_CARE_PROVIDER_SITE_OTHER): Payer: Medicare Other | Admitting: Internal Medicine

## 2015-01-03 VITALS — BP 116/64 | HR 96 | Temp 98.0°F | Resp 18 | Ht 64.0 in | Wt 186.0 lb

## 2015-01-03 DIAGNOSIS — M797 Fibromyalgia: Secondary | ICD-10-CM

## 2015-01-03 DIAGNOSIS — Z23 Encounter for immunization: Secondary | ICD-10-CM

## 2015-01-03 NOTE — Patient Instructions (Signed)
Diphtheria Toxoid; Tetanus Toxoid Adsorbed, DT, Td What is this medicine? DIPHTHERIA AND TETANUS TOXOIDS ADSORBED (dif THEER ee uh and TET n us TOK soids ad SAWRB) is a vaccine. It is used to prevent infections of diphtheria and tetanus (lockjaw). This medicine may be used for other purposes; ask your health care provider or pharmacist if you have questions. COMMON BRAND NAME(S): DECAVAC, TENIVAC What should I tell my health care provider before I take this medicine? They need to know if you have any of these conditions: -bleeding disorder -immune system problems -infection with fever -low levels of platelets in the blood -an unusual or allergic reaction to diphtheria or tetanus toxoid, latex, thimerosal, other medicines, foods, dyes, or preservatives -pregnant or trying to get pregnant -breast-feeding How should I use this medicine? This vaccine is for injection into a muscle. It is given by a health care professional. A copy of Vaccine Information Statements will be given before each vaccination. Read this sheet carefully each time. The sheet may change frequently. Talk to your pediatrician regarding the use of this medicine in children. While this drug may be prescribed for selected conditions, precautions do apply. Overdosage: If you think you have taken too much of this medicine contact a poison control center or emergency room at once. NOTE: This medicine is only for you. Do not share this medicine with others. What if I miss a dose? Keep appointments for follow-up (booster) doses as directed. It is important not to miss your dose. Call your doctor or health care professional if you are unable to keep an appointment. What may interact with this medicine? -adalimumab -anakinra -infliximab -live vaccines -medicines that suppress your immune system -medicines to treat cancer -medicines that treat or prevent blood clots like daily aspirin, enoxaparin, heparin, ticlopidine,  warfarin -radiopharmaceuticals like iodine I-125 or I-131 This list may not describe all possible interactions. Give your health care provider a list of all the medicines, herbs, non-prescription drugs, or dietary supplements you use. Also tell them if you smoke, drink alcohol, or use illegal drugs. Some items may interact with your medicine. What should I watch for while using this medicine? Contact your doctor or health care professional and seek emergency medical care if any serious side effects occur. This vaccine, like all vaccines, may not fully protect everyone. What side effects may I notice from receiving this medicine? Side effects that you should report to your doctor or health care professional as soon as possible: -allergic reactions like skin rash, itching or hives, swelling of the face, lips, or tongue -arthritis pain -breathing problems -changes in hearing -extreme changes in behavior -fast, irregular heartbeat -fever over 100 degrees F -pain, tingling, numbness in the hands or feet -seizures -unusually weak or tired Side effects that usually do not require medical attention (report to your doctor or health care professional if they continue or are bothersome): -aches or pains -bruising, pain, swelling at site where injected -headache -loss of appetite -low-grade fever of 100 degrees F or less -nausea, vomiting -sleepy -swollen glands This list may not describe all possible side effects. Call your doctor for medical advice about side effects. You may report side effects to FDA at 1-800-FDA-1088. Where should I keep my medicine? This drug is given in a hospital or clinic and will not be stored at home. NOTE: This sheet is a summary. It may not cover all possible information. If you have questions about this medicine, talk to your doctor, pharmacist, or health care provider.    2015, Elsevier/Gold Standard. (2007-10-14 13:57:36)  

## 2015-01-03 NOTE — Progress Notes (Signed)
   Subjective:    Patient ID: Crystal [name], female    DOB: 09-13-1950, 64 y.o.   MRN: [id number]  HPI  Patient presents to the office for 1 month recheck after restarting cymbalta.  She reports that since seeing [place] last month she has been doing okay.  She reports that she has been having some numbness in her right leg.  She reports that she has had an MRI done and is seeing Dr. Elvera Bicker.  She is going to get the reading done tomorrow.  She feels like the cymbalta is helping significantly.  She is very happy with the relief of her fibromyalgia.  She does feel a little puffy, but feels that this is related to her constipation.  She reports no other problems with her medication.    Review of Systems  Constitutional: Positive for fatigue. Negative for fever and chills.  Respiratory: Negative for chest tightness and shortness of breath.   Cardiovascular: Negative for chest pain and palpitations.  Musculoskeletal: Positive for back pain. Negative for myalgias.  Neurological: Positive for numbness. Negative for dizziness.       Objective:   Physical Exam  Constitutional: She is oriented to person, place, and time. She appears well-developed and well-nourished. No distress.  HENT:  Head: Normocephalic and atraumatic.  Mouth/Throat: Oropharynx is clear and moist. No oropharyngeal exudate.  Eyes: Conjunctivae are normal. No scleral icterus.  Neck: Normal range of motion. Neck supple. No JVD present. No thyromegaly present.  Cardiovascular: Normal rate, regular rhythm, normal heart sounds and intact distal pulses.  Exam reveals no gallop and no friction rub.   No murmur heard. Pulmonary/Chest: Effort normal and breath sounds normal. No respiratory distress. She has no wheezes. She has no rales. She exhibits no tenderness.  Abdominal: Soft. Bowel sounds are normal. She exhibits no distension and no mass. There is no tenderness. There is no rebound and no guarding.  Musculoskeletal: Normal range  of motion.  Lymphadenopathy:    She has no cervical adenopathy.  Neurological: She is alert and oriented to person, place, and time.  Skin: Skin is warm and dry. She is not diaphoretic.  Psychiatric: She has a normal mood and affect. Her behavior is normal. Judgment and thought content normal.  Nursing note and vitals reviewed.         Assessment & Plan:    1. Need for prophylactic vaccination with tetanus-diphtheria (TD)  - DT Vaccine greater than 7yo IM  2. Fibromyalgia -cont cymbalta -try cutting flexeril in half to see if this decreases fatigue in the am

## 2015-01-13 ENCOUNTER — Other Ambulatory Visit: Payer: Self-pay | Admitting: Physician Assistant

## 2015-01-20 ENCOUNTER — Other Ambulatory Visit: Payer: Self-pay | Admitting: Internal Medicine

## 2015-01-31 ENCOUNTER — Other Ambulatory Visit: Payer: Self-pay | Admitting: *Deleted

## 2015-01-31 ENCOUNTER — Encounter: Payer: Self-pay | Admitting: Internal Medicine

## 2015-01-31 ENCOUNTER — Ambulatory Visit (INDEPENDENT_AMBULATORY_CARE_PROVIDER_SITE_OTHER): Payer: Medicare Other | Admitting: Internal Medicine

## 2015-01-31 VITALS — BP 126/80 | HR 84 | Temp 97.6°F | Resp 16 | Ht 64.0 in | Wt 185.8 lb

## 2015-01-31 DIAGNOSIS — E039 Hypothyroidism, unspecified: Secondary | ICD-10-CM

## 2015-01-31 DIAGNOSIS — M62838 Other muscle spasm: Secondary | ICD-10-CM

## 2015-01-31 DIAGNOSIS — E119 Type 2 diabetes mellitus without complications: Secondary | ICD-10-CM

## 2015-01-31 DIAGNOSIS — R7309 Other abnormal glucose: Secondary | ICD-10-CM

## 2015-01-31 DIAGNOSIS — Z6831 Body mass index (BMI) 31.0-31.9, adult: Secondary | ICD-10-CM

## 2015-01-31 DIAGNOSIS — I10 Essential (primary) hypertension: Secondary | ICD-10-CM

## 2015-01-31 DIAGNOSIS — E785 Hyperlipidemia, unspecified: Secondary | ICD-10-CM | POA: Diagnosis not present

## 2015-01-31 DIAGNOSIS — Z0000 Encounter for general adult medical examination without abnormal findings: Secondary | ICD-10-CM

## 2015-01-31 DIAGNOSIS — E559 Vitamin D deficiency, unspecified: Secondary | ICD-10-CM | POA: Diagnosis not present

## 2015-01-31 DIAGNOSIS — Z79899 Other long term (current) drug therapy: Secondary | ICD-10-CM

## 2015-01-31 DIAGNOSIS — K219 Gastro-esophageal reflux disease without esophagitis: Secondary | ICD-10-CM

## 2015-01-31 LAB — CBC WITH DIFFERENTIAL/PLATELET
Basophils Absolute: 0 10*3/uL (ref 0.0–0.1)
Basophils Relative: 0 % (ref 0–1)
Eosinophils Absolute: 0.2 10*3/uL (ref 0.0–0.7)
Eosinophils Relative: 2 % (ref 0–5)
HCT: 45.8 % (ref 36.0–46.0)
Hemoglobin: 15.6 g/dL — ABNORMAL HIGH (ref 12.0–15.0)
Lymphocytes Relative: 23 % (ref 12–46)
Lymphs Abs: 2.3 10*3/uL (ref 0.7–4.0)
MCH: 30.5 pg (ref 26.0–34.0)
MCHC: 34.1 g/dL (ref 30.0–36.0)
MCV: 89.5 fL (ref 78.0–100.0)
MPV: 8.7 fL (ref 8.6–12.4)
Monocytes Absolute: 0.9 10*3/uL (ref 0.1–1.0)
Monocytes Relative: 9 % (ref 3–12)
Neutro Abs: 6.5 10*3/uL (ref 1.7–7.7)
Neutrophils Relative %: 66 % (ref 43–77)
Platelets: 317 10*3/uL (ref 150–400)
RBC: 5.12 MIL/uL — ABNORMAL HIGH (ref 3.87–5.11)
RDW: 13.3 % (ref 11.5–15.5)
WBC: 9.9 10*3/uL (ref 4.0–10.5)

## 2015-01-31 LAB — BASIC METABOLIC PANEL WITH GFR
BUN: 17 mg/dL (ref 7–25)
CO2: 30 mmol/L (ref 20–31)
Calcium: 9.5 mg/dL (ref 8.6–10.4)
Chloride: 99 mmol/L (ref 98–110)
Creat: 0.93 mg/dL (ref 0.50–0.99)
GFR, Est African American: 74 mL/min (ref >=60)
GFR, Est Non African American: 64 mL/min (ref >=60)
Glucose, Bld: 91 mg/dL (ref 65–99)
Potassium: 4.1 mmol/L (ref 3.5–5.3)
Sodium: 142 mmol/L (ref 135–146)

## 2015-01-31 LAB — HEPATIC FUNCTION PANEL
ALT: 11 U/L (ref 6–29)
AST: 12 U/L (ref 10–35)
Albumin: 4 g/dL (ref 3.6–5.1)
Alkaline Phosphatase: 36 U/L (ref 33–130)
Bilirubin, Direct: 0.1 mg/dL (ref <=0.2)
Indirect Bilirubin: 0.4 mg/dL (ref 0.2–1.2)
Total Bilirubin: 0.5 mg/dL (ref 0.2–1.2)
Total Protein: 6.4 g/dL (ref 6.1–8.1)

## 2015-01-31 LAB — MAGNESIUM: Magnesium: 2.1 mg/dL (ref 1.5–2.5)

## 2015-01-31 LAB — LIPID PANEL
Cholesterol: 193 mg/dL (ref 125–200)
HDL: 52 mg/dL (ref >=46)
LDL Cholesterol: 105 mg/dL (ref <130)
Total CHOL/HDL Ratio: 3.7 Ratio (ref <=5.0)
Triglycerides: 179 mg/dL — ABNORMAL HIGH (ref <150)
VLDL: 36 mg/dL — ABNORMAL HIGH (ref <30)

## 2015-01-31 LAB — HEMOGLOBIN A1C
Hgb A1c MFr Bld: 6.6 % — ABNORMAL HIGH (ref <5.7)
Mean Plasma Glucose: 143 mg/dL — ABNORMAL HIGH (ref <117)

## 2015-01-31 LAB — TSH: TSH: 6.299 u[IU]/mL — ABNORMAL HIGH (ref 0.350–4.500)

## 2015-01-31 MED ORDER — FENOFIBRATE MICRONIZED 134 MG PO CAPS: ORAL_CAPSULE | ORAL | Status: DC

## 2015-01-31 MED ORDER — LEVOTHYROXINE SODIUM 100 MCG PO TABS: ORAL_TABLET | ORAL | Status: DC

## 2015-01-31 MED ORDER — ROPINIROLE HCL 4 MG PO TABS
ORAL_TABLET | ORAL | Status: DC
Start: 2015-01-31 — End: 2015-06-04

## 2015-01-31 MED ORDER — LOSARTAN POTASSIUM 50 MG PO TABS: 50.0000 mg | ORAL_TABLET | Freq: Every day | ORAL | Status: DC

## 2015-01-31 MED ORDER — BACLOFEN 20 MG PO TABS: ORAL_TABLET | ORAL | Status: DC

## 2015-01-31 MED ORDER — DEXLANSOPRAZOLE 60 MG PO CPDR: 60.0000 mg | DELAYED_RELEASE_CAPSULE | Freq: Every day | ORAL | Status: DC

## 2015-01-31 MED ORDER — CYCLOBENZAPRINE HCL 10 MG PO TABS: ORAL_TABLET | ORAL | Status: DC

## 2015-01-31 NOTE — Progress Notes (Signed)
Patient ID: Crystal [name], female   DOB: 11-Jul-1950, 64 y.o.   MRN: [id number]   This very nice 64 y.o. [name] presents for  follow up with Hypertension, Hyperlipidemia, Pre-Diabetes and Vitamin D Deficiency.    Patient is treated for HTN since 2006 & BP has been controlled at home. Today's BP: 126/80 mmHg. Patient has had no complaints of any cardiac type chest pain, palpitations, dyspnea/orthopnea/PND, dizziness, claudication, or dependent edema.   Hyperlipidemia is controlled with diet & meds. Patient denies myalgias or other med SE's. Last Lipids were near goal - Chol 180; HDL 34*; LDL 103*; and elevated Trig 214 on 10/31/2014.    Also, the patient has history of T2_NIDDM predating since Oct 2012 and she has attempted to manage this with diet  and she has had no symptoms of reactive hypoglycemia, diabetic polys, paresthesias or visual blurring.  Today's A1c is 6.6%.     Further, the patient also has history of Vitamin D Deficiency of 33 in 2008 and supplements vitamin D without any suspected side-effects. Last vitamin D was  57 on 10/31/2014.  Medication Sig  . acyclovir (ZOVIRAX) 800 MG tablet take 1 tablet by mouth 3 times daily before meals and 2 at bedtime  . bisoprolol-hctz (ZIAC) 10-6.25 MG per tablet TAKE ONE TABLET BY MOUTH EVERY DAY FOR BLOOD PRESSURE   . BUTRANS - DOSED MCG/HR WK patch Place 10 mcg onto the skin once a week.  . cetirizine 10 MG tablet Take 1 tablet (10 mg total) by mouth at bedtime.  . DULoxetine (CYMBALTA) 60 MG capsule Take 1 capsule (60 mg total) by mouth daily.  . fluticasone (FLONASE) 50 MCG/ACT nasal spray USE TWO SPRAYS IN EACH NOSTRIL DAILY AS NEEDED  . gabapentin (NEURONTIN) 800 MG tablet TAKE ONE TABLET BY MOUTH FOUR TIMES DAILY  . lubiprostone (AMITIZA) 24 MCG capsule Take 24 mcg by mouth 2 (two) times daily with a meal.  . CORTISPORIN otic solution 6  - 8 drops to affected ear 4 x daily for Otitis Externa  . oxybutynin  5 MG tablet take 1 tablet twice up to  three times a day  . prochlorperazine  5 MG tablet Take 1 tablet (5 mg total) by mouth every 6 (six) hours as needed for nausea or vomiting.  . ranitidine  150 MG tablet Take 150 mg by mouth at bedtime.  . TRAMADOL  Take by mouth as needed.  [name] NASACORT  Place 1 spray into the nose 2 (two) times daily.  . Vitamin D 50,000 UNITS CAPS Take one capsule by mouth one time daily  . baclofen (LIORESAL) 20 MG tablet Take 20 mg by mouth 2 (two) times daily. 7 am & 2 pm  . cyclobenzaprine  10 MG tablet TAKE 1 TABLET at HS  . dexlansoprazole ( 60 MG capsule Take 1 capsule (60 mg total) by mouth daily.  . fenofibrate  134 MG capsule TAKE 1 CAPSULE BY MOUTH DAILY BEFORE BREAKFAST  . levothyroxine  100 MCG tablet Take 1 tablet by mouth on tues, wed, thurs, sat, & sun. Take 1 & 1/2 tablet on mon & fri.  . losartan (COZAAR) 50 MG tablet TAKE ONE TABLET BY MOUTH ONE TIME DAILY   . rOPINIRole (REQUIP) 4 MG tablet TAKE 1 TABLET BY MOUTH AT BEDTIME FOR RESTLESS LEG   Allergies  Allergen Reactions  . Amitriptyline   . Atorvastatin   . Bio-Flax   . Codeine   . Erythromycin   . Fish Oil   .  Flax Seed [Bio-Flax]   . Linzess [Linaclotide]   . Morphine   . Nsaids   . Nuvigil [Armodafinil]   . Pravastatin   . Sinemet [Carbidopa W-Levodopa]   . Statins   . Sulfonamide Derivatives   . Zoloft [Sertraline Hcl]     PMHx:   Past Medical History  Diagnosis Date  . Restless leg syndrome   . OSA (obstructive sleep apnea)   . Fibromyalgia   . Crohn's   . GERD (gastroesophageal reflux disease)   . Rhinitis 06/25/2010  . Gait disorder 10/26/2012  . Hypertension   . Hyperlipidemia   . Elevated hemoglobin A1c   . Thyroid disease   . Hypothyroid   . Depression   . Vitamin D deficiency   . ASCVD (arteriosclerotic cardiovascular disease)    Immunization History  Administered Date(s) Administered  . DT 01/03/2015  . Influenza Whole 03/30/2010  . Influenza-Unspecified 03/31/2011, 05/14/2014  .  Pneumococcal-Unspecified 06/30/2005  . Td 06/30/2004  . Zoster 07/01/2007   Past Surgical History  Procedure Laterality Date  . Tonsillectomy    . Knee susrgery    . Shoulder adhesion release    . Spine surgery    . Appendectomy  1960  . Tonsillectomy  1960  . Cesarean section  1983 & 1886  . Abdominal hysterectomy  1992  . Rotator cuff repair Left 2005   FHx:    Reviewed / unchanged  SHx:    Reviewed / unchanged  Systems Review:  Constitutional: Denies fever, chills, wt changes, headaches, insomnia, fatigue, night sweats, change in appetite. Eyes: Denies redness, blurred vision, diplopia, discharge, itchy, watery eyes.  ENT: Denies discharge, congestion, post nasal drip, epistaxis, sore throat, earache, hearing loss, dental pain, tinnitus, vertigo, sinus pain, snoring.  CV: Denies chest pain, palpitations, irregular heartbeat, syncope, dyspnea, diaphoresis, orthopnea, PND, claudication or edema. Respiratory: denies cough, dyspnea, DOE, pleurisy, hoarseness, laryngitis, wheezing.  Gastrointestinal: Denies dysphagia, odynophagia, heartburn, reflux, water brash, abdominal pain or cramps, nausea, vomiting, bloating, diarrhea, constipation, hematemesis, melena, hematochezia  or hemorrhoids. Genitourinary: Denies dysuria, frequency, urgency, nocturia, hesitancy, discharge, hematuria or flank pain. Musculoskeletal: Denies arthralgias, myalgias, stiffness, jt. swelling, pain, limping or strain/sprain.  Skin: Denies pruritus, rash, hives, warts, acne, eczema or change in skin lesion(s). Neuro: No weakness, tremor, incoordination, spasms, paresthesia or pain. Psychiatric: Denies confusion, memory loss or sensory loss. Endo: Denies change in weight, skin or hair change.  Heme/Lymph: No excessive bleeding, bruising or enlarged lymph nodes.  Physical Exam  BP 126/80 mmHg  Pulse 84  Temp(Src) 97.6 F (36.4 C)  Resp 16  Ht 5\' 4"  (1.626 m)  Wt 185 lb 12.8 oz (84.278 kg)  BMI 31.88  kg/m2  Appears well nourished and in no distress. Eyes: PERRLA, EOMs, conjunctiva no swelling or erythema. Sinuses: No frontal/maxillary tenderness ENT/Mouth: EAC's clear, TM's nl w/o erythema, bulging. Nares clear w/o erythema, swelling, exudates. Oropharynx clear without erythema or exudates. Oral hygiene is good. Tongue normal, non obstructing. Hearing intact.  Neck: Supple. Thyroid nl. Car 2+/2+ without bruits, nodes or JVD. Chest: Respirations nl with BS clear & equal w/o rales, rhonchi, wheezing or stridor.  Cor: Heart sounds normal w/ regular rate and rhythm without sig. murmurs, gallops, clicks, or rubs. Peripheral pulses normal and equal  without edema.  Abdomen: Soft & bowel sounds normal. Non-tender w/o guarding, rebound, hernias, masses, or organomegaly.  Lymphatics: Unremarkable.  Musculoskeletal: Full ROM all peripheral extremities, joint stability, 5/5 strength, and normal gait.  Skin: Warm, dry without exposed  rashes, lesions or ecchymosis apparent.  Neuro: Cranial nerves intact, reflexes equal bilaterally. Sensory-motor testing grossly intact. Tendon reflexes grossly intact.  Pysch: Alert & oriented x 3.  Insight and judgement nl & appropriate. No ideations.  Assessment and Plan:  1. Essential hypertension  - TSH  2. Hyperlipidemia  - Lipid panel  3. T2_NIDDM, diet controlled  - Hemoglobin A1c - Insulin, random  4. Vitamin D deficiency  - Vit D  25 hydroxy (rtn osteoporosis monitoring); Future  5. Hypothyroidism, unspecified hypothyroidism type  - TSH  6. Gastroesophageal reflux disease, esophagitis presence not specified   7. Muscle spasms of lower extremity, unspecified laterality  - baclofen (LIORESAL) 20 MG tablet; Take 1 tablet 2 x daily at 7 AM and 2 PM for muscle spasm  Dispense: 180 each; Refill: 1  8. Medication management  - CBC with Differential/Platelet - BASIC METABOLIC PANEL WITH GFR - Hepatic function panel - Magnesium  9. BMI  31.0-31.9,adult   Recommended regular exercise, BP monitoring, weight control, and discussed med and SE's. Recommended labs to assess and monitor clinical status. Further disposition pending results of labs. Over 30 minutes of exam, counseling, chart review was performed

## 2015-01-31 NOTE — Patient Instructions (Signed)

## 2015-02-01 LAB — INSULIN, RANDOM: Insulin: 6.3 u[IU]/mL (ref 2.0–19.6)

## 2015-02-27 ENCOUNTER — Other Ambulatory Visit: Payer: Self-pay | Admitting: Internal Medicine

## 2015-03-13 ENCOUNTER — Ambulatory Visit (INDEPENDENT_AMBULATORY_CARE_PROVIDER_SITE_OTHER): Payer: Medicare Other | Admitting: Internal Medicine

## 2015-03-13 ENCOUNTER — Encounter: Payer: Self-pay | Admitting: Internal Medicine

## 2015-03-13 VITALS — BP 106/66 | HR 84 | Temp 97.0°F | Resp 18 | Ht 64.0 in | Wt 183.2 lb

## 2015-03-13 DIAGNOSIS — E119 Type 2 diabetes mellitus without complications: Secondary | ICD-10-CM | POA: Diagnosis not present

## 2015-03-13 DIAGNOSIS — L03211 Cellulitis of face: Secondary | ICD-10-CM | POA: Diagnosis not present

## 2015-03-13 DIAGNOSIS — Z6831 Body mass index (BMI) 31.0-31.9, adult: Secondary | ICD-10-CM

## 2015-03-13 DIAGNOSIS — L0201 Cutaneous abscess of face: Secondary | ICD-10-CM

## 2015-03-13 DIAGNOSIS — Z23 Encounter for immunization: Secondary | ICD-10-CM | POA: Diagnosis not present

## 2015-03-13 MED ORDER — DOXYCYCLINE HYCLATE 100 MG PO CAPS: ORAL_CAPSULE | ORAL | Status: DC

## 2015-03-13 NOTE — Progress Notes (Signed)
Subjective:    Patient ID: Crystal [name], female    DOB: 1950-10-26, 64 y.o.   MRN: [id number]  HPI  Patient presents for evaluation of a sore lump of her left chin and right cheek - the latter which she lanced with a straight pin.   She alo recounted hx/o chronic and disabling neck & LBP and bilat shoulder pains w/hx/o bilat rotator cuff surgeries at [facility] by Dr Selmer Dominion (?sp) (on the Rt x 1 and the Left x 2). He referred her to Dr Joanette Gula (?sp)  at [facility] for consideration of bilat  reverse-cup shoulder replacements, but wanted an opinion from her neurosurgeon, Dr Venetia Maxon and patient also was referred to Dr Fredia Beets at [facility]  (appt pending in Oct) for a 2sd opinion for consideration of sequence of neck vs shoulder surg 1st.  Patient was adamant that if she needs neck surgery that she prefers Dr Venetia Maxon do the surgery.    Medication Sig  . acyclovir (ZOVIRAX) 800 MG tablet take 1 tablet by mouth 3 times daily before meals and 2 at bedtime  . baclofen (LIORESAL) 20 MG tablet Take 1 tablet 2 x daily at 7 AM and 2 PM for muscle spasm  . bisoprolol-hctz (ZIAC) 10-6.25 MG  TAKE ONE TABLET BY MOUTH EVERY DAY FOR BLOOD PRESSURE   . BUTRANS - DOSED MCG/HR) 10 MCG/HR  Place 10 mcg onto the skin once a week.  [name] buPROPion  XL 300 MG  Take 300 mg by mouth daily.  . cetirizine (ZYRTEC) 10 MG tablet Take 1 tablet (10 mg total) by mouth at bedtime.  . cyclobenzaprine (FLEXERIL) 10 MG tablet TAKE 1 TABLET BY MOUTH EVERY 8 HOURS AS NEEDED FOR MUSCLE SPASMS.  [name] dexlansoprazole (DEXILANT) 60 MG capsule Take 1 capsule (60 mg total) by mouth daily.  . DULoxetine (CYMBALTA) 60 MG capsule TAKE 1 CAPSULE BY MOUTH DAILY.  . fenofibrate micronized (LOFIBRA) 134 MG capsule TAKE 1 CAPSULE BY MOUTH DAILY BEFORE BREAKFAST  . FLONASE nasal spray USE TWO SPRAYS IN EACH NOSTRIL DAILY AS NEEDED  . gabapentin (NEURONTIN) 800 MG tablet TAKE ONE TABLET BY MOUTH FOUR TIMES DAILY  . levothyroxine  100 MCG tablet Take 1 tablet by  mouth on tues, wed, thurs, sat, & sun. Take 1 & 1/2 tablet on mon & fri.  . losartan (COZAAR) 50 MG tablet Take 1 tablet (50 mg total) by mouth daily.  [name] lubiprostone (AMITIZA) 24 MCG capsule Take 24 mcg by mouth 2 (two) times daily with a meal.  . oxybutynin (DITROPAN) 5 MG tablet take 1 tablet twice up to three times a day  . prochlorperazine (COMPAZINE) 5 MG tablet Take 1 tablet (5 mg total) by mouth every 6 (six) hours as needed for nausea or vomiting.  . ranitidine (ZANTAC) 150 MG tablet Take 150 mg by mouth at bedtime.  [name] rOPINIRole (REQUIP) 4 MG tablet TAKE 1 TABLET BY MOUTH AT BEDTIME FOR RESTLESS LEG  . TRAMADOL HCL PO Take by mouth as needed.  . Vitamin D 50,000 UNITS CAPS capsule Take one capsule by mouth one time daily   Allergies  Allergen Reactions  . Amitriptyline   . Atorvastatin   . Bio-Flax   . Codeine   . Erythromycin   . Fish Oil   . Flax Seed [Bio-Flax]   . Linzess [Linaclotide]   . Morphine   . Nsaids   . Nuvigil [Armodafinil]   . Pravastatin   . Sinemet [Carbidopa W-Levodopa]   .  Statins   . Sulfonamide Derivatives   . Zoloft [Sertraline Hcl]    /pmh Past Surgical History  Procedure Laterality Date  . Tonsillectomy    . Knee susrgery    . Shoulder adhesion release    . Spine surgery    . Appendectomy  1960  . Tonsillectomy  1960  . Cesarean section  1983 & 1886  . Abdominal hysterectomy  1992  . Rotator cuff repair Left 2005   Review of Systems  10 point systems review negative except as above.     Objective:   Physical Exam  BP 106/66 mmHg  Pulse 84  Temp(Src) 97 F (36.1 C)  Resp 18  Ht 5\' 4"  (1.626 m)  Wt 183 lb 3.2 oz (83.099 kg)  BMI 31.43 kg/m2  Foicused exam of the head finds HEENT - Eac's patent. TM's Nl. EOM's full. PERRLA. NasoOroPharynx clear.there is a tender approx 1 cm palpable nodule in the left lower chin  w/o definite induration noted and no overlying skin changes. There is also a tender sebaceous cyst over the right  cheek zygoma area.  Neck is supple with tender para-cx spasm and no palpable Cx LN's.     Assessment & Plan:   1. Cellulitis and abscess of face  - doxycycline (VIBRAMYCIN) 100 MG capsule; Take 2 capsules at once on a full stomach, then take 1 capsule  2 x day on a full stomach for infection for 10 days  Dispense: 22 capsule; Refill: 0  - ROV 10-12 days to recheck.  2. Need for prophylactic vaccination and inoculation against influenza  - Flu vaccine HIGH DOSE PF (Fluzone High dose)

## 2015-03-15 ENCOUNTER — Other Ambulatory Visit: Payer: Self-pay | Admitting: *Deleted

## 2015-03-15 MED ORDER — PILOCARPINE HCL 5 MG PO TABS: 5.0000 mg | ORAL_TABLET | Freq: Two times a day (BID) | ORAL | Status: DC

## 2015-03-26 ENCOUNTER — Encounter: Payer: Self-pay | Admitting: Internal Medicine

## 2015-03-26 ENCOUNTER — Ambulatory Visit (INDEPENDENT_AMBULATORY_CARE_PROVIDER_SITE_OTHER): Payer: Medicare Other | Admitting: Internal Medicine

## 2015-03-26 VITALS — BP 110/62 | HR 88 | Temp 97.3°F | Resp 16 | Ht 64.0 in | Wt 187.4 lb

## 2015-03-26 DIAGNOSIS — L0201 Cutaneous abscess of face: Secondary | ICD-10-CM

## 2015-03-26 DIAGNOSIS — L03211 Cellulitis of face: Principal | ICD-10-CM

## 2015-03-26 NOTE — Progress Notes (Signed)
   Subjective:    Patient ID: Crystal [name], female    DOB: June 02, 1951, 64 y.o.   MRN: [id number]  HPI  Patient returns for f/u of infected sebaceous cyst of the left lower lip for which she had been on Doxycycline for 10 days. Review of Systems Neg except as above    Objective:   Physical Exam  BP 110/62 mmHg  Pulse 88  Temp(Src) 97.3 F (36.3 C)  Resp 16  Ht 5\' 4"  (1.626 m)  Wt 187 lb 6.4 oz (85.004 kg)  BMI 32.15 kg/m2  The cyst appears larger and is still tender.     Assessment & Plan:   1. Cellulitis and abscess of face  - Ref to Dr Narda Bonds and patient is to be seen now as a work-in

## 2015-04-12 ENCOUNTER — Ambulatory Visit: Payer: Self-pay | Admitting: Internal Medicine

## 2015-04-17 ENCOUNTER — Ambulatory Visit (INDEPENDENT_AMBULATORY_CARE_PROVIDER_SITE_OTHER): Payer: Medicare Other | Admitting: Internal Medicine

## 2015-04-17 ENCOUNTER — Encounter: Payer: Self-pay | Admitting: Internal Medicine

## 2015-04-17 VITALS — BP 110/78 | HR 88 | Temp 97.6°F | Resp 16 | Ht 64.0 in | Wt 188.2 lb

## 2015-04-17 DIAGNOSIS — Z6832 Body mass index (BMI) 32.0-32.9, adult: Secondary | ICD-10-CM

## 2015-04-17 DIAGNOSIS — Z23 Encounter for immunization: Secondary | ICD-10-CM

## 2015-04-17 DIAGNOSIS — K130 Diseases of lips: Secondary | ICD-10-CM

## 2015-04-17 MED ORDER — DOXYCYCLINE HYCLATE 100 MG PO CAPS: ORAL_CAPSULE | ORAL | Status: DC

## 2015-04-17 NOTE — Progress Notes (Signed)
Subjective:    Patient ID: Crystal [name], female    DOB: 05/04/1951, 64 y.o.   MRN: [id number]  HPI  Patient returns as a 3 week f/u of an infected cyst of the left lower lip that was I&D'd by Dr Narda Bonds and treated with Keflex 500 mg tid x 1 week . She feels that she still has some tender STS in the left lower lip. Denies fever.She relates that Dr Ezzard Standing expressed concern tht the infection represented a Staph infection, but no culture was done.     She also relates an upcoming visit with Dr Venetia Maxon in anticipation of scheduling neck surgery to be done before her [facility]ist will do a Rt shoulder replacement. Other interests she is pursing is to have a spinal cord stimulator done by Dr Jordan Likes for ongoing LE neuropathic pains which she relates have recently improved or lessened somewhat.     Medication Sig  . acyclovir (ZOVIRAX) 800 MG tablet take 1 tablet by mouth 3 times daily before meals and 2 at bedtime  . baclofen (LIORESAL) 20 MG tablet Take 1 tablet 2 x daily at 7 AM and 2 PM for muscle spasm  . bisoprolol-hydrochlorothiazide (ZIAC) 10-6.25 MG per tablet TAKE ONE TABLET BY MOUTH EVERY DAY FOR BLOOD PRESSURE   . buprenorphine (BUTRANS - DOSED MCG/HR) 10 MCG/HR PTWK patch Place 10 mcg onto the skin once a week.  [name] buPROPion (WELLBUTRIN XL) 300 MG 24 hr tablet Take 300 mg by mouth daily.  . cetirizine (ZYRTEC) 10 MG tablet Take 1 tablet (10 mg total) by mouth at bedtime.  . cyclobenzaprine (FLEXERIL) 10 MG tablet TAKE 1 TABLET BY MOUTH EVERY 8 HOURS AS NEEDED FOR MUSCLE SPASMS.  [name] dexlansoprazole (DEXILANT) 60 MG capsule Take 1 capsule (60 mg total) by mouth daily.  . DULoxetine (CYMBALTA) 60 MG capsule TAKE 1 CAPSULE BY MOUTH DAILY.  . fenofibrate micronized (LOFIBRA) 134 MG capsule TAKE 1 CAPSULE BY MOUTH DAILY BEFORE BREAKFAST  . fluticasone (FLONASE) 50 MCG/ACT nasal spray USE TWO SPRAYS IN EACH NOSTRIL DAILY AS NEEDED  . gabapentin (NEURONTIN) 800 MG tablet TAKE ONE TABLET  BY MOUTH FOUR TIMES DAILY  . levothyroxine (SYNTHROID, LEVOTHROID) 100 MCG tablet Take 1 tablet by mouth on tues, wed, thurs, sat, & sun. Take 1 & 1/2 tablet on mon & fri.  . losartan (COZAAR) 50 MG tablet Take 1 tablet (50 mg total) by mouth daily.  [name] lubiprostone (AMITIZA) 24 MCG capsule Take 24 mcg by mouth 2 (two) times daily with a meal.  . neomycin-polymyxin-hydrocortisone (CORTISPORIN) otic solution 6  - 8 drops to affected ear 4 x daily for Otitis Externa  . oxybutynin (DITROPAN) 5 MG tablet take 1 tablet twice up to three times a day  . pilocarpine (SALAGEN) 5 MG tablet Take 1 tablet (5 mg total) by mouth 2 (two) times daily.  . prochlorperazine (COMPAZINE) 5 MG tablet Take 1 tablet (5 mg total) by mouth every 6 (six) hours as needed for nausea or vomiting.  . ranitidine (ZANTAC) 150 MG tablet Take 150 mg by mouth at bedtime.  [name] rOPINIRole (REQUIP) 4 MG tablet TAKE 1 TABLET BY MOUTH AT BEDTIME FOR RESTLESS LEG  . TRAMADOL HCL PO Take by mouth as needed.  . Triamcinolone Acetonide (NASACORT ALLERGY 24HR NA) Place 1 spray into the nose 2 (two) times daily.  . Vitamin D, Ergocalciferol, (DRISDOL) 50000 UNITS CAPS capsule Take one capsule by mouth one time daily   Allergies  Allergen Reactions  . Amitriptyline   . Atorvastatin   . Bio-Flax   . Codeine   . Erythromycin   . Fish Oil   . Flax Seed [Bio-Flax]   . Linzess [Linaclotide]   . Morphine   . Nsaids   . Nuvigil [Armodafinil]   . Pravastatin   . Sinemet [Carbidopa W-Levodopa]   . Statins   . Sulfonamide Derivatives   . Zoloft [Sertraline Hcl]    Past Medical History  Diagnosis Date  . Restless leg syndrome   . Fibromyalgia   . Rhinitis 06/25/2010  . Gait disorder 10/26/2012  . Hyperlipidemia   . Hypothyroid   . Vitamin D deficiency   . ASCVD (arteriosclerotic cardiovascular disease)    Past Surgical History  Procedure Laterality Date  . Tonsillectomy    . Knee susrgery    . Shoulder adhesion release    . Spine  surgery    . Appendectomy  1960  . Tonsillectomy  1960  . Cesarean section  1983 & 1886  . Abdominal hysterectomy  1992  . Rotator cuff repair Left 2005   Review of Systems - pertinent  as relates above    Objective:   Physical Exam  BP 110/78 mmHg  Pulse 88  Temp(Src) 97.6 F (36.4 C)  Resp 16  Ht  (1.626 m)  Wt 188 lb 3.2 oz (85.367 kg)  BMI 32.29 kg/m2  HEENT - Eac's patent. TM's Nl. EOM's full. PERRLA. NasoOroPharynx clear. Still sl tender induration of the left lower lip w/o fluctuance & no obvious drainage or apparent cellulitis.  Neck - supple. Nl Thyroid. Carotids 2+ & No bruits, nodes, JVD Chest - Clear  Cor - Nl HS. RRR w/o sig M. No edema. MS- dec ROM Rt shoulder fr pain. Muscle power, tone and bulk Nl. Gait Nl. Neuro - No obvious Cr N abnormalities. Sensory, motor and Cerebellar functions appear Nl w/o focal abnormalities. Psyche - Mental status normal & appropriate.   Skin - clear    Assessment & Plan:   1. Query Cellulitis of lower left lip  - In consideration of upcoming neck surg will empirically treat 2 more weeks with Doxycycline and anticipate that  She will have routine testing for MRSA carrier status prior to any elective surgeries  - Doxycycline (VIBRAMYCIN) 100 MG capsule; Take 1 capsule  2 x day on a full stomach for infection for 2 weeks  Dispense: 30 capsule; Refill: 0  2. Need for prophylactic vaccination against Streptococcus pneumoniae (pneumococcus)  - Pneumococcal conjugate vaccine 13-valent  3. BMI 32.0-32.9,adult  - discussed Meds/SE's  cc: Dr Venetia Maxon

## 2015-05-03 ENCOUNTER — Ambulatory Visit: Payer: Self-pay | Admitting: Physician Assistant

## 2015-05-04 ENCOUNTER — Other Ambulatory Visit: Payer: Self-pay | Admitting: Internal Medicine

## 2015-05-08 ENCOUNTER — Ambulatory Visit (INDEPENDENT_AMBULATORY_CARE_PROVIDER_SITE_OTHER): Payer: Medicare Other | Admitting: Physician Assistant

## 2015-05-08 ENCOUNTER — Encounter: Payer: Self-pay | Admitting: Physician Assistant

## 2015-05-08 VITALS — BP 130/80 | HR 91 | Temp 97.0°F | Resp 14 | Ht 64.0 in | Wt 184.0 lb

## 2015-05-08 DIAGNOSIS — I10 Essential (primary) hypertension: Secondary | ICD-10-CM | POA: Diagnosis not present

## 2015-05-08 DIAGNOSIS — E559 Vitamin D deficiency, unspecified: Secondary | ICD-10-CM | POA: Diagnosis not present

## 2015-05-08 DIAGNOSIS — Z79899 Other long term (current) drug therapy: Secondary | ICD-10-CM | POA: Diagnosis not present

## 2015-05-08 DIAGNOSIS — E119 Type 2 diabetes mellitus without complications: Secondary | ICD-10-CM | POA: Diagnosis not present

## 2015-05-08 DIAGNOSIS — E785 Hyperlipidemia, unspecified: Secondary | ICD-10-CM | POA: Diagnosis not present

## 2015-05-08 DIAGNOSIS — Z1331 Encounter for screening for depression: Secondary | ICD-10-CM

## 2015-05-08 DIAGNOSIS — G4733 Obstructive sleep apnea (adult) (pediatric): Secondary | ICD-10-CM

## 2015-05-08 DIAGNOSIS — Z1389 Encounter for screening for other disorder: Secondary | ICD-10-CM

## 2015-05-08 DIAGNOSIS — E039 Hypothyroidism, unspecified: Secondary | ICD-10-CM

## 2015-05-08 LAB — CBC WITH DIFFERENTIAL/PLATELET
Basophils Absolute: 0.1 10*3/uL (ref 0.0–0.1)
Basophils Relative: 1 % (ref 0–1)
Eosinophils Absolute: 0.2 10*3/uL (ref 0.0–0.7)
Eosinophils Relative: 4 % (ref 0–5)
HCT: 45.1 % (ref 36.0–46.0)
Hemoglobin: 15 g/dL (ref 12.0–15.0)
Lymphocytes Relative: 23 % (ref 12–46)
Lymphs Abs: 1.4 10*3/uL (ref 0.7–4.0)
MCH: 29.7 pg (ref 26.0–34.0)
MCHC: 33.3 g/dL (ref 30.0–36.0)
MCV: 89.3 fL (ref 78.0–100.0)
MPV: 8.6 fL (ref 8.6–12.4)
Monocytes Absolute: 0.7 10*3/uL (ref 0.1–1.0)
Monocytes Relative: 11 % (ref 3–12)
Neutro Abs: 3.7 10*3/uL (ref 1.7–7.7)
Neutrophils Relative %: 61 % (ref 43–77)
Platelets: 272 10*3/uL (ref 150–400)
RBC: 5.05 MIL/uL (ref 3.87–5.11)
RDW: 13 % (ref 11.5–15.5)
WBC: 6 10*3/uL (ref 4.0–10.5)

## 2015-05-08 LAB — BASIC METABOLIC PANEL WITH GFR
BUN: 15 mg/dL (ref 7–25)
CO2: 28 mmol/L (ref 20–31)
Calcium: 9.7 mg/dL (ref 8.6–10.4)
Chloride: 101 mmol/L (ref 98–110)
Creat: 0.76 mg/dL (ref 0.50–0.99)
GFR, Est African American: >89 mL/min (ref >=60)
GFR, Est Non African American: 82 mL/min (ref >=60)
Glucose, Bld: 111 mg/dL — ABNORMAL HIGH (ref 65–99)
Potassium: 4 mmol/L (ref 3.5–5.3)
Sodium: 142 mmol/L (ref 135–146)

## 2015-05-08 LAB — LIPID PANEL
Cholesterol: 149 mg/dL (ref 125–200)
HDL: 35 mg/dL — ABNORMAL LOW (ref >=46)
LDL Cholesterol: 82 mg/dL (ref <130)
Total CHOL/HDL Ratio: 4.3 Ratio (ref <=5.0)
Triglycerides: 158 mg/dL — ABNORMAL HIGH (ref <150)
VLDL: 32 mg/dL — ABNORMAL HIGH (ref <30)

## 2015-05-08 LAB — HEPATIC FUNCTION PANEL
ALT: 11 U/L (ref 6–29)
AST: 15 U/L (ref 10–35)
Albumin: 4.3 g/dL (ref 3.6–5.1)
Alkaline Phosphatase: 40 U/L (ref 33–130)
Bilirubin, Direct: 0.1 mg/dL (ref <=0.2)
Indirect Bilirubin: 0.3 mg/dL (ref 0.2–1.2)
Total Bilirubin: 0.4 mg/dL (ref 0.2–1.2)
Total Protein: 6.7 g/dL (ref 6.1–8.1)

## 2015-05-08 LAB — MAGNESIUM: Magnesium: 2 mg/dL (ref 1.5–2.5)

## 2015-05-08 MED ORDER — CETIRIZINE HCL 10 MG PO TABS: 10.0000 mg | ORAL_TABLET | Freq: Every day | ORAL | Status: DC

## 2015-05-08 MED ORDER — FEXOFENADINE HCL 180 MG PO TABS: 180.0000 mg | ORAL_TABLET | Freq: Every day | ORAL | Status: DC

## 2015-05-08 MED ORDER — OXYBUTYNIN CHLORIDE 5 MG PO TABS: ORAL_TABLET | ORAL | Status: DC

## 2015-05-08 NOTE — Patient Instructions (Addendum)
Recommendations For Diabetic/Prediabetic Patients:   -  Take medications as prescribed  -  Recommend Dr Fara Olden Fuhrman's book "The End of Diabetes "  And "The End of Dieting"- Can get at  [email or web address] and encourage also get the Audio CD book  - AVOID Animal products, ie. Meat - red/white, Poultry and Dairy/especially cheese - Exercise at least 5 times a week for 30 minutes or preferably daily.  - No Smoking - Drink less than 2 drinks a day.  - Monitor your feet for sores - Have yearly Eye Exams - Recommend annual Flu vaccine  - Recommend Pneumovax and Prevnar vaccines - Shingles Vaccine (Zostavax) if over 64 y.o.  Goals:   - BMI less than 24 - Fasting sugar less than 130 or less than 150 if tapering medicines to lose weight  - Systolic BP less than 242  - Diastolic BP less than 80 - Bad LDL Cholesterol less than 70 - Triglycerides less than 150    Bad carbs also include fruit juice, alcohol, and sweet tea. These are empty calories that do not signal to your brain that you are full.   Please remember the good carbs are still carbs which convert into sugar. So please measure them out no more than 1/2-1 cup of rice, oatmeal, pasta, and beans  Veggies are however free foods! Pile them on.   Not all fruit is created equal. Please see the list below, the fruit at the bottom is higher in sugars than the fruit at the top. Please avoid all dried fruits.     Diabetes is a very complicated disease...lets simplify it.  An easy way to look at it to understand the complications is if you think of the extra sugar floating in your blood stream as glass shards floating through your blood stream.    Diabetes affects your small vessels first: 1) The glass shards (sugar) scraps down the tiny blood vessels in your eyes and lead to diabetic retinopathy, the leading cause of blindness in the [place]. Diabetes is the leading cause of newly diagnosed adult (18 to 66 years of age) blindness in the [place].  2) The glass shards scratches down the tiny vessels of your legs leading to nerve damage called neuropathy and can lead to amputations of your feet. More than 60% of all non-traumatic amputations of lower limbs occur in people with diabetes.  3) Over time the small vessels in your brain are shredded and closed off, individually this does not cause any problems but over a long period of time many of the small vessels being blocked can lead to Vascular Dementia.   4) Your kidney's are a filter system and have a "net" that keeps certain things in the body and lets bad things out. Sugar shreds this net and leads to kidney damage and eventually failure. Decreasing the sugar that is destroying the net and certain blood pressure medications can help stop or decrease progression of kidney disease. Diabetes was the primary cause of kidney failure in 44 percent of all new cases in 2011.  5) Diabetes also destroys the small vessels in your penis that lead to erectile dysfunction. Eventually the vessels are so damaged that you may not be responsive to cialis or viagra.   Diabetes and your large vessels: Your larger vessels consist of your coronary arteries in your heart and the carotid vessels to your brain. Diabetes or even increased sugars put you at 300% increased risk of heart attack and stroke  and this is why.. The sugar scrapes down your large blood vessels and your body sees this as an internal injury and tries to repair itself. Just like you get a scab on your skin, your platelets will stick to the blood vessel wall trying to heal it. This is why we have diabetics on low dose aspirin daily, this prevents the platelets from sticking and can prevent plaque formation. In addition, your body takes cholesterol and tries to shove it into the open wound. This is why we want your LDL, or bad cholesterol, below 70.   The combination of platelets and cholesterol over 5-10 years forms plaque that can break  off and cause a heart attack or stroke.   PLEASE REMEMBER:  Diabetes is preventable! Up to 48 percent of complications and morbidities among individuals with type 2 diabetes can be prevented, delayed, or effectively treated and minimized with regular visits to a health professional, appropriate monitoring and medication, and a healthy diet and lifestyle.   Hypothyroidism Hypothyroidism is a disorder of the thyroid. The thyroid is a large gland that is located in the lower front of the neck. The thyroid releases hormones that control how the body works. With hypothyroidism, the thyroid does not make enough of these hormones. CAUSES Causes of hypothyroidism may include: 5. Viral infections. 6. Pregnancy. 7. Your own defense system (immune system) attacking your thyroid. 8. Certain medicines. 9. Birth defects. 10. Past radiation treatments to your head or neck. 11. Past treatment with radioactive iodine. 12. Past surgical removal of part or all of your thyroid. 13. Problems with the gland that is located in the center of your brain (pituitary). SIGNS AND SYMPTOMS Signs and symptoms of hypothyroidism may include:  Feeling as though you have no energy (lethargy).  Inability to tolerate cold.  Weight gain that is not explained by a change in diet or exercise habits.  Dry skin.  Coarse hair.  Menstrual irregularity.  Slowing of thought processes.  Constipation.  Sadness or depression. DIAGNOSIS  Your health care provider may diagnose hypothyroidism with blood tests and ultrasound tests. TREATMENT Hypothyroidism is treated with medicine that replaces the hormones that your body does not make. After you begin treatment, it may take several weeks for symptoms to go away. HOME CARE INSTRUCTIONS   Take medicines only as directed by your health care provider.  If you start taking any new medicines, tell your health care provider.  Keep all follow-up visits as directed by your  health care provider. This is important. As your condition improves, your dosage needs may change. You will need to have blood tests regularly so that your health care provider can watch your condition. SEEK MEDICAL CARE IF:  Your symptoms do not get better with treatment.  You are taking thyroid replacement medicine and:  You sweat excessively.  You have tremors.  You feel anxious.  You lose weight rapidly.  You cannot tolerate heat.  You have emotional swings.  You have diarrhea.  You feel weak. SEEK IMMEDIATE MEDICAL CARE IF:   You develop chest pain.  You develop an irregular heartbeat.  You develop a rapid heartbeat.   This information is not intended to replace advice given to you by your health care provider. Make sure you discuss any questions you have with your health care provider.   Document Released: 06/16/2005 Document Revised: 07/07/2014 Document Reviewed: 11/01/2013 Elsevier Interactive Patient Education [employer].

## 2015-05-08 NOTE — Progress Notes (Signed)
Assessment and Plan:  1. Hypertension -Continue medication, monitor blood pressure at home. Continue DASH diet.  Reminder to go to the ER if any CP, SOB, nausea, dizziness, severe HA, changes vision/speech, left arm numbness and tingling and jaw pain.  2. Cholesterol -Continue diet and exercise. Check cholesterol.   3. PreDiabetes  -Continue diet and exercise. Check A1C  4. Vitamin D Def - check level and continue medications.   .5. Hypothyroidism- check TSH level, continue medications the same, reminded to take on an empty stomach 30-48mns before food.  Send results to [facility] and Dr. DPatrecia Pour 6. Shoulder pain and back pain Follow up ortho  7. Imbalance/Fatigue Several medications can cause it as well as not using bipap, allergies, non compliance with TSH medication, sugar is poorly controlled at this time-normal neuro-  Continue bASA, check labs and get back on BiPAP but with fatigue will either get titration study or new sleep study, if all normal will start to decrease medications.   Continue diet and meds as discussed. Further disposition pending results of labs. Discussed med's effects and SE's.   Over 30 minutes of exam, counseling, chart review, and critical decision making was performed   HPI 64y.o. female  presents for 3 month follow up on hypertension, cholesterol, diabetes and vitamin D deficiency.   Her blood pressure has been controlled at home, today her BP is BP: 130/80 mmHg.  She does workout but has been very busy and has not been doing it regularly. She denies chest pain, shortness of breath,.  She has been to [facility] for her shoulder and neck, her shoulder surgery has been canceled and she is going to see Dr. SVertell Limberfor neck surgery due to chronic pain.  She has been fatigued, followed up with Dr. ARexene Albertswith neuro, on Bipap, last study was 15 years ago, still having trouble with it. .   She is not on cholesterol medication, on fenofibrate and denies myalgias. Her  cholesterol is not at goal. The cholesterol was:   Lab Results  Component Value Date   CHOL 193 01/31/2015   HDL 52 01/31/2015   LDLCALC 105 01/31/2015   TRIG 179* 01/31/2015   CHOLHDL 3.7 01/31/2015   She has been working on diet and exercise for diabetes with CKD,and denies  paresthesia of the feet, polydipsia, polyuria and visual disturbances. Last A1C was:  Lab Results  Component Value Date   HGBA1C 6.6* 01/31/2015   Lab Results  Component Value Date   GFRNONAA 64 01/31/2015    Patient is on Vitamin D supplement. Lab Results  Component Value Date   VD25OH 54205/08/2014   She is on thyroid medication. Her medication was changed last visit to 1 pill daily but suppose to take 1.5 pills 2 days a week but has not done that.  Lab Results  Component Value Date   TSH 6.299* 01/31/2015   BMI is Body mass index is 31.57 kg/(m^2)., she is working on diet and exercise. Wt Readings from Last 3 Encounters:  05/08/15 184 lb (83.462 kg)  04/17/15 188 lb 3.2 oz (85.367 kg)  03/26/15 187 lb 6.4 oz (85.004 kg)    Current Medications:  Current Outpatient Prescriptions on File Prior to Visit  Medication Sig Dispense Refill  . acyclovir (ZOVIRAX) 800 MG tablet take 1 tablet by mouth 3 times daily before meals and 2 at bedtime 50 tablet PRN  . baclofen (LIORESAL) 20 MG tablet Take 1 tablet 2 x daily at 7 AM  and 2 PM for muscle spasm 180 each 1  . bisoprolol-hydrochlorothiazide (ZIAC) 10-6.25 MG per tablet TAKE ONE TABLET BY MOUTH EVERY DAY FOR BLOOD PRESSURE  90 tablet 99  . buprenorphine (BUTRANS - DOSED MCG/HR) 10 MCG/HR PTWK patch Place 10 mcg onto the skin once a week.    [name] buPROPion (WELLBUTRIN XL) 300 MG 24 hr tablet Take 300 mg by mouth daily.    . cetirizine (ZYRTEC) 10 MG tablet Take 1 tablet (10 mg total) by mouth at bedtime. 30 tablet 3  . cyclobenzaprine (FLEXERIL) 10 MG tablet TAKE 1 TABLET BY MOUTH EVERY 8 HOURS AS NEEDED FOR MUSCLE SPASMS. 90 tablet 0  . dexlansoprazole  (DEXILANT) 60 MG capsule Take 1 capsule (60 mg total) by mouth daily. 30 capsule 0  . doxycycline (VIBRAMYCIN) 100 MG capsule TAKE 1 CAPSULE 2 X DAY ON A FULL STOMACH FOR INFECTION FOR 2 WEEKS 30 capsule 0  . DULoxetine (CYMBALTA) 60 MG capsule TAKE 1 CAPSULE BY MOUTH DAILY. 30 capsule 2  . fenofibrate micronized (LOFIBRA) 134 MG capsule TAKE 1 CAPSULE BY MOUTH DAILY BEFORE BREAKFAST 30 capsule 0  . fluticasone (FLONASE) 50 MCG/ACT nasal spray USE TWO SPRAYS IN EACH NOSTRIL DAILY AS NEEDED 16 g 99  . gabapentin (NEURONTIN) 800 MG tablet TAKE ONE TABLET BY MOUTH FOUR TIMES DAILY 360 tablet 99  . levothyroxine (SYNTHROID, LEVOTHROID) 100 MCG tablet Take 1 tablet by mouth on tues, wed, thurs, sat, & sun. Take 1 & 1/2 tablet on mon & fri. 32 tablet 0  . losartan (COZAAR) 50 MG tablet Take 1 tablet (50 mg total) by mouth daily. 30 tablet 0  . lubiprostone (AMITIZA) 24 MCG capsule Take 24 mcg by mouth 2 (two) times daily with a meal.    . neomycin-polymyxin-hydrocortisone (CORTISPORIN) otic solution 6  - 8 drops to affected ear 4 x daily for Otitis Externa 10 mL 0  . oxybutynin (DITROPAN) 5 MG tablet take 1 tablet twice up to three times a day 90 tablet PRN  . pilocarpine (SALAGEN) 5 MG tablet Take 1 tablet (5 mg total) by mouth 2 (two) times daily. 60 tablet 6  . prochlorperazine (COMPAZINE) 5 MG tablet Take 1 tablet (5 mg total) by mouth every 6 (six) hours as needed for nausea or vomiting. 30 tablet 0  . ranitidine (ZANTAC) 150 MG tablet Take 150 mg by mouth at bedtime.    [name] rOPINIRole (REQUIP) 4 MG tablet TAKE 1 TABLET BY MOUTH AT BEDTIME FOR RESTLESS LEG 30 tablet 0  . TRAMADOL HCL PO Take by mouth as needed.    . Triamcinolone Acetonide (NASACORT ALLERGY 24HR NA) Place 1 spray into the nose 2 (two) times daily.    . Vitamin D, Ergocalciferol, (DRISDOL) 50000 UNITS CAPS capsule Take one capsule by mouth one time daily 30 capsule PRN   No current facility-administered medications on file prior to  visit.   Medical History:  Past Medical History  Diagnosis Date  . Restless leg syndrome   . Fibromyalgia   . Rhinitis 06/25/2010  . Gait disorder 10/26/2012  . Hyperlipidemia   . Hypothyroid   . Vitamin D deficiency   . ASCVD (arteriosclerotic cardiovascular disease)    Allergies:  Allergies  Allergen Reactions  . Amitriptyline   . Atorvastatin   . Bio-Flax   . Codeine   . Erythromycin   . Fish Oil   . Flax Seed [Bio-Flax]   . Linzess [Linaclotide]   . Morphine   . Nsaids   .  Nuvigil [Armodafinil]   . Pravastatin   . Sinemet [Carbidopa W-Levodopa]   . Statins   . Sulfonamide Derivatives   . Zoloft [Sertraline Hcl]      Review of Systems:  Review of Systems  Constitutional: Positive for malaise/fatigue (has not used BiPAP x 1-2 months). Negative for fever, chills, weight loss and diaphoresis.  HENT: Positive for congestion and tinnitus. Negative for ear discharge, ear pain, hearing loss, nosebleeds and sore throat.   Respiratory: Positive for cough. Negative for hemoptysis, sputum production, shortness of breath, wheezing and stridor.   Cardiovascular: Negative.   Gastrointestinal: Positive for constipation. Negative for heartburn, nausea, vomiting, abdominal pain, diarrhea, blood in stool and melena.       Following up with Dr. Earlean Shawl today  Musculoskeletal: Positive for myalgias, back pain, joint pain (shoulder) and neck pain. Negative for falls.  Skin: Negative.   Neurological: Negative for dizziness, tingling, tremors, sensory change, speech change, focal weakness, seizures, loss of consciousness, weakness and headaches.  Psychiatric/Behavioral: Positive for memory loss. Negative for depression, suicidal ideas, hallucinations and substance abuse. The patient has insomnia. The patient is not nervous/anxious.     Family history- Review and unchanged Social history- Review and unchanged Physical Exam: BP 130/80 mmHg  Pulse 100  Temp(Src) 97 F (36.1 C)  (Temporal)  Resp 14  Ht 5' 4"  (1.626 m)  Wt 184 lb (83.462 kg)  BMI 31.57 kg/m2  SpO2 97% Wt Readings from Last 3 Encounters:  05/08/15 184 lb (83.462 kg)  04/17/15 188 lb 3.2 oz (85.367 kg)  03/26/15 187 lb 6.4 oz (85.004 kg)   General Appearance: Well nourished, in no apparent distress. Eyes: PERRLA, EOMs, conjunctiva no swelling or erythema Sinuses: No Frontal/maxillary tenderness ENT/Mouth: Ext aud canals clear, TMs without erythema, bulging. No erythema, swelling, or exudate on post pharynx.  Tonsils not swollen or erythematous. Hearing normal.  Neck: Supple, thyroid normal.  Respiratory: Respiratory effort normal, BS equal bilaterally without rales, rhonchi, wheezing or stridor.  Cardio: RRR with no MRGs. Brisk peripheral pulses without edema.  Abdomen: Soft, + BS.  Non tender, no guarding, rebound, hernias, masses. Lymphatics: Non tender without lymphadenopathy.  Musculoskeletal: Full ROM, 5/5 strength, Antalgic gait.  Skin: Warm, dry without rashes, lesions, ecchymosis.  Neuro: Cranial nerves intact. No cerebellar symptoms. 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    Vicie Mutters, PA-C 11:33 AM [facility]

## 2015-05-09 ENCOUNTER — Other Ambulatory Visit: Payer: Self-pay | Admitting: Internal Medicine

## 2015-05-09 ENCOUNTER — Other Ambulatory Visit: Payer: Self-pay | Admitting: Physician Assistant

## 2015-05-09 ENCOUNTER — Telehealth: Payer: Self-pay | Admitting: Neurology

## 2015-05-09 LAB — TSH: TSH: 4.278 u[IU]/mL (ref 0.350–4.500)

## 2015-05-09 LAB — HEMOGLOBIN A1C
Hgb A1c MFr Bld: 6.6 % — ABNORMAL HIGH (ref <5.7)
Mean Plasma Glucose: 143 mg/dL — ABNORMAL HIGH (ref <117)

## 2015-05-09 LAB — VITAMIN D 25 HYDROXY (VIT D DEFICIENCY, FRACTURES): Vit D, 25-Hydroxy: 72 ng/mL (ref 30–100)

## 2015-05-09 MED ORDER — DULOXETINE HCL 60 MG PO CPEP: 60.0000 mg | ORAL_CAPSULE | Freq: Every day | ORAL | Status: DC

## 2015-05-09 NOTE — Telephone Encounter (Signed)
Dr Rexene Alberts, Keauna Alvarez [phone] was seen on 12/06/14, she has popped up on my workqueue list and I'm not sure what type of an appointment is needed.  Please let me know if you want her scheduled for a sleep study or consult.  Thanks, [employer]

## 2015-05-10 ENCOUNTER — Other Ambulatory Visit: Payer: Self-pay

## 2015-05-10 MED ORDER — VITAMIN D (ERGOCALCIFEROL) 1.25 MG (50000 UNIT) PO CAPS: 50000.0000 [IU] | ORAL_CAPSULE | Freq: Every day | ORAL | Status: DC

## 2015-05-12 ENCOUNTER — Other Ambulatory Visit: Payer: Self-pay | Admitting: Internal Medicine

## 2015-05-14 ENCOUNTER — Other Ambulatory Visit: Payer: Self-pay | Admitting: Internal Medicine

## 2015-05-15 ENCOUNTER — Other Ambulatory Visit: Payer: Self-pay | Admitting: Neurosurgery

## 2015-06-02 NOTE — H&P (Signed)
Patient ID:   [id number] Patient: Crystal [name]  Date of Birth: January 18, 1951 Visit Type: Office Visit   Date: 05/14/2015 01:30 PM Provider: Marchia Meiers. Vertell Limber MD   This 64 year old female presents for back pain.  History of Present Illness: 1.  back pain  Last seen in March, patient followed up with Dr. Earlean Shawl and was diagnosed with Crohn's disease.  She has been unable to afford new prescriptions, however.  Since that time, she followed up with [facility] about shoulder surgery.  Right shoulder surgery was scheduled and then canceled, as the surgeon wanted patient to have a second opinion of her neck.  A [facility] physician evaluated her neck and recommended surgery.  She returns today to discuss surgery with Dr. Vertell Limber.  She reports episodes of leg weakness, hand weakness, falls and increased neck pain.  MRI January 2015 CANOPY  The patient comes back after having been seen at [facility] she was recommended Surgery because of cervical stenosis.  She has been complaining of bilateral upper extremity and lower knee pain and is having increasing falling and has brisk upper and lower extremity reflexes on examination today.  I reviewed the patient's previous MRI of her cervical spine which does demonstrate significant stenosis at C3 C4, C4 C5, C5 C6, C6 C7 levels.  She is "compression each of these levels.  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  Based on the severity of the patient's pain complaints and the fact that she is falling and the fact that she has cervical stenosis, I recommended proceeding with cervical surgery as was previously recommended at [facility].  She wishes to have this done here as I had done surgery on her in the past.  Because of the long interval from previous cervical imaging I recommended obtaining a new MRI of cervical spine but feel that we can go ahead with planning surgery for her cervical stenosis.      Medical/Surgical/Interim History Reviewed, no  change.  Last detailed document date:07/13/2013.   PAST MEDICAL HISTORY, SURGICAL HISTORY, FAMILY HISTORY, SOCIAL HISTORY AND REVIEW OF SYSTEMS I have reviewed the patient's past medical, surgical, family and social history as well as the comprehensive review of systems as included on the [facility] history form dated 04/26/2014, which I have signed.  Family History: Reviewed, no changes.  Last detailed document: 07/13/2013.   Social History: Tobacco use reviewed. Reviewed, no changes. Last detailed document date: 07/13/2013.      MEDICATIONS(added, continued or stopped this visit): Started Medication Directions Instruction Stopped   acyclovir 800 mg tablet take 1 tablet by oral route 5 times every day for 10 days     Amitiza 24 mcg capsule take 1 capsule by oral route 2 times every day with food and water     bisoprolol 10 mg-hydrochlorothiazide 6.25 mg tablet take 1 tablet by oral route  every day     bupropion HCl XL 300 mg 24 hr tablet, extended release take 1 tablet by oral route  every day     Butrans 10 mcg/hour transdermal patch apply 1 patch by transdermal route  every 7 days     Dexilant 60 mg capsule, delayed release take 1 capsule by oral route  every day for 8 weeks     duloxetine 60 mg capsule,delayed release take 1 capsule by oral route  every day     ergocalciferol (vitamin D2) 50,000 unit capsule take 1 capsule by oral  route  every week     fenofibrate micronized 134 mg capsule take 1 capsule by oral route  every day with food     gabapentin 800 mg tablet take 1 tablet by oral route 4 times every day     lorazepam 2 mg tablet take 1 tablet by oral route  every day as needed     losartan potassium 69m ORAL Take 1 tablet daily     metaxalone 800 mg tablet take 1 tablet by oral route 3 times every day as needed     oxybutynin chloride 5 mg tablet take 1 tablet by oral route 2 times every day     pilocarpine 5 mg tablet take 1 tablet by oral  route 2 times every day     ropinirole 4 mg tablet take 0.5 tablet by oral route  every day     Tirosint 100 mcg capsule take 1 capsule by oral route  every day     tramadol 50 mg tablet take 1 tablet by oral route  every 6 hours as needed     triamcinolone acetonide 55 mcg nasal spray aerosol spray 2 spray by intranasal route  every day in each nostril       ALLERGIES: Ingredient Reaction Medication Name Comment  AMITRIPTYLINE     CARBIDOPA  Sinemet   HYDROCODONE     SULFA (SULFONAMIDE ANTIBIOTICS)     SERTRALINE HCL  Zoloft   LEVODOPA  Sinemet   NSAIDS (NON-STEROIDAL ANTI-INFLAMMATORY DRUG)     CODEINE     AMITRIPTYLINE HCL  Elavil   MORPHINE     FLAXSEED     ERYTHROMYCIN BASE      Reviewed, no changes.    Vitals Date Temp F BP Pulse Ht In Wt Lb BMI BSA Pain Score  05/14/2015  128/81 105 63.5 184 32.08  5/10      IMPRESSION Herniated cervical discs with cervical stenosis and cervical myelopathy at the C3 C4, C4 C5, C5 C6, C6 C7 levels.  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  Completed Orders (this encounter) Order Details Reason Side Interpretation Result Initial Treatment Date Region  Cervical Spine- AP/Lat/Flex/Ex      05/14/2015 All Levels to All Levels  Lifestyle education regarding diet Encouraged to eat a well balanced diet and follow up with primary care physician.         Assessment/Plan # Detail Type Description   1. Assessment Cervicalgia (M54.2).       2. Assessment Cervical myelopathy (G95.9).       3. Assessment Displacement of intervertebral disc of high cervical region (M50.21).       4. Assessment Radiculopathy, cervical region (M54.12).       5. Assessment Spinal stenosis in cervical region (M48.02).       6. Assessment Body mass index (BMI) 32.0-32.9, adult ((O12.24.   Plan Orders Today's instructions / counseling include(s) Lifestyle education regarding diet.         Pain Assessment/Treatment Pain Scale: 5/10. Method: Numeric  Pain Intensity Scale. Location: back. Onset: 12/05/1988. Duration: varies. Quality: discomforting. Pain Assessment/Treatment follow-up plan of care: Patient is taking medications as prescribed..  Fall Risk Plan The Patient has fallen 5 times in the last year.  Falls risk follow-up plan of care: Assisted devices: Advised patient to use handrails..  Patient wishes to proceed with surgery.  She has a lot of shoulder pain which was felt to be coming from her neck by the orthopedist at  [facility].  The plan is for cervical decompression fusion on 06/14/15.  The risks and benefits were discussed in detail with the patient.  She is going to get a repeat MRI of the cervical spine prior to surgery.  I will also obtain 4 view cervical radiographs.  We went over models and specific teaching.  She was fitted for a cervical collar.  She understands risks and benefits and wishes to proceed.  Orders: Diagnostic Procedures: Assessment Procedure  M48.02 ACDF - C3-C4 - C4-C5 - C5-C6 - C6-C7  M48.02 Cervical Spine- AP/Lat/Flex/Ex  M48.02 Cervical Spine- Lateral  M48.02 MRI Spinal/cerv W/o Contrast  Instruction(s)/Education: Assessment Instruction  Z68.32 Lifestyle education regarding diet             Provider:  Marchia Meiers. Vertell Limber MD  05/19/2015 02:11 PM Dictation edited by: Marchia Meiers. Vertell Limber    CC Providers: Unk Pinto [facility] [address] [place],    [address]-   Malcomb Gangemi MD [address] [place], [place] [address]              Electronically signed by Marchia Meiers. Vertell Limber MD on 05/19/2015 02:11 PM  Patient ID:   [id number] Patient: Crystal [name]  Date of Birth: 08/05/1950 Visit Type: Office Visit   Date: 09/06/2014 11:45 AM Provider: Marchia Meiers. Vertell Limber MD   This 64 year old female presents for back pain.  History of Present Illness: 1.  back pain  Pt returns for review of abdominal MRI following 1 month lumbar & left  abdominal pain.  SCS discussions on hold until current pain etiology established. She is to see Dr. Earlean Shawl tomorrow.   Butrans patch, Gabapentin, Cymbalta, and Tramadol continue without change  imaging on Canopy  I reviewed the patient's abdominal MRI with her which shows some bowel wall thickening.  I examined her and she appears to have left lower quadrant abdominal pain with radiation into her back.  I told her that at this point I think that she should follow-up with Dr. Earlean Shawl regarding her abdominal complaints with me if he feels that her pain is not related to her abdominal process.  If this is the case we'll need to get an MRI of her lumbar spine that is dedicated to that region as I am not able to make good assessments of her lumbar pathology based on an abdominal MRI.  A lot of her complaints seem related to primary abdominal process with diarrheal illness as well as abdominal pain and abdominal wall thickening.      Medical/Surgical/Interim History Reviewed, no change.  Last detailed document date:07/13/2013.   PAST MEDICAL HISTORY, SURGICAL HISTORY, FAMILY HISTORY, SOCIAL HISTORY AND REVIEW OF SYSTEMS I have reviewed the patient's past medical, surgical, family and social history as well as the comprehensive review of systems as included on the [facility] history form dated 04/26/2014, which I have signed.  Family History: Reviewed, no changes.  Last detailed document: 07/13/2013.   Social History: Tobacco use reviewed. Reviewed, no changes. Last detailed document date: 07/13/2013.      MEDICATIONS(added, continued or stopped this visit): Started Medication Directions Instruction Stopped   acyclovir 800 mg tablet take 1 tablet by oral route 5 times every day for 10 days     Amitiza 24 mcg capsule take 1 capsule by oral route 2 times every day with food and water     bisoprolol 10 mg-hydrochlorothiazide 6.25 mg tablet take 1 tablet by  oral  route  every day     bupropion HCl XL 300 mg 24 hr tablet, extended release take 1 tablet by oral route  every day     Butrans 10 mcg/hour transdermal patch apply 1 patch by transdermal route  every 7 days     Dexilant 60 mg capsule, delayed release take 1 capsule by oral route  every day for 8 weeks     duloxetine 60 mg capsule,delayed release take 1 capsule by oral route  every day     ergocalciferol (vitamin D2) 50,000 unit capsule take 1 capsule by oral route  every week     fenofibrate micronized 134 mg capsule take 1 capsule by oral route  every day with food     gabapentin 800 mg tablet take 1 tablet by oral route 4 times every day     lorazepam 2 mg tablet take 1 tablet by oral route  every day as needed     losartan potassium 67m ORAL Take 1 tablet daily     metaxalone 800 mg tablet take 1 tablet by oral route 3 times every day as needed     oxybutynin chloride 5 mg tablet take 1 tablet by oral route 2 times every day     pilocarpine 5 mg tablet take 1 tablet by oral route 2 times every day     ropinirole 4 mg tablet take 0.5 tablet by oral route  every day     Tirosint 100 mcg capsule take 1 capsule by oral route  every day     tramadol 50 mg tablet take 1 tablet by oral route  every 6 hours as needed     triamcinolone acetonide 55 mcg nasal spray aerosol spray 2 spray by intranasal route  every day in each nostril       ALLERGIES: Ingredient Reaction Medication Name Comment  AMITRIPTYLINE     CARBIDOPA  Sinemet   HYDROCODONE     SULFA (SULFONAMIDE ANTIBIOTICS)     SERTRALINE HCL  Zoloft   LEVODOPA  Sinemet   NSAIDS (NON-STEROIDAL ANTI-INFLAMMATORY DRUG)     CODEINE     AMITRIPTYLINE HCL  Elavil   MORPHINE     FLAXSEED     ERYTHROMYCIN BASE         Vitals Date Temp F BP Pulse Ht In Wt Lb BMI BSA Pain Score  09/06/2014  139/79 93 63.5 184 32.08  3/10      IMPRESSION Patient is complaining of abdominal pain and back pain.  She is going to see Dr. MEarlean Shawlto  assess this and will follow up with me if he feels that this is related primarily to her low back.  Completed Orders (this encounter) Order Details Reason Side Interpretation Result Initial Treatment Date Region  Dietary management education, guidance, and counseling Encouraged to eat a well balanced diet and follow up with primary care physician.         Assessment/Plan # Detail Type Description   1. Assessment Degeneration of lumbar or lumbosacral intervertebral disc (M51.37).       2. Assessment Low back pain, unspecified back pain laterality, with sciatica presence unspecified (M54.5).       3. Assessment Body mass index (BMI) 32.0-32.9, adult ((I69.62.   Plan Orders Today's instructions / counseling include(s) Dietary management education, guidance, and counseling.         Pain Assessment/Treatment Pain Scale: 3/10. Method: Numeric Pain Intensity Scale. Location: back. Onset: 12/05/1988. Duration: varies. Quality:  discomforting. Pain Assessment/Treatment follow-up plan of care: Patient taking medications as prescribed..  Fall Risk Plan The patient has not fallen in the last year.  Follow up with me as needed.  Orders: Instruction(s)/Education: Assessment Instruction  [phone] Dietary management education, guidance, and counseling             Provider:  Marchia Meiers. Vertell Limber MD  09/09/2014 02:09 PM Dictation edited by: Marchia Meiers. Vertell Limber    CC Providers: Unk Pinto [facility] [address] [place],  [place]  [address]-   Fidelia Cathers MD [address] [place], [place] [address]              Electronically signed by Marchia Meiers. Vertell Limber MD on 09/09/2014 02:09 PM  > [place], [place] [address] Phone: [phone]   Patient ID:   [id number] Patient: Crystal [name]  Date of Birth: 12-Jul-1950 Visit Type: Office Visit   Date: 07/13/2013 02:15 PM Provider: Marchia Meiers. Vertell Limber MD    This 64 year old female presents for Follow Up of back pain.  HISTORY OF PRESENT ILLNESS: 1.  Follow Up of back pain   Mrs Puckett visits reporting right buttock & hamstring pain, with occasional left side s/s as well.  She has been referred back by Dr. French Ana.  She had ESI L3-4 + trigger point injections by DrHarkins in July She had trigger pount injections by rheumatologist in December.  PT & Water Therapy helped a great deal in December; but she reports "stepping wrong" a couple times when exiting water, resulting in incrreased right buttock pain. She then saw Dr. French Ana, who referred her back to DrStern.  Tramadol 95m 2BID Neurontin 8068mTID Cymbalta 6017m/day Skelaxin 800m74mD-TID  Patient notes 3 episodes of TIA-like symptoms with loss of vision and has now had her I evaluated and has been told that she does not have an intrinsic problem in her eye.  She says that on the first part of December she injured her low back and had excruciating right thigh pain at 10 out of 10 as well as pain into her hip along with weakness she now associates she is improving and her pain is down to 3 out of 10 and denies left leg pain she[facility]  Of neck pain which is quite bothersome to her as well as low back pain and right leg pain.  She has not had any recent imaging studies.  On examination today the patient has a persistent left Bell's palsy.  She has full strength in upper and lower extremities to confrontation.  She has pain in her right shoulder to palpation.  She has negative Spurling maneuver.  She complains of pain in her right leg and has right sciatic notch discomfort.  She complains of pain in her right buttock into her right thigh.     PAST MEDICAL/SURGICAL HISTORY  (Detailed)  Disease/disorder Onset Date Management Date Comments  Fibromyalgia      High cholesterol      Hypertension          PAST MEDICAL HISTORY, SURGICAL  HISTORY, FAMILY HISTORY, SOCIAL HISTORY AND REVIEW OF SYSTEMS I have reviewed the patient's past medical, surgical, family and social history as well as the comprehensive review of systems as included on the [facility] history form dated 01/10/2013, which I have signed.  Family History  (Detailed)  Relationship Family Member Name Deceased Age at Death Condition Onset Age Cause of Death      Family history of Cancer, breast  N      Family history of Stroke  N      Family history of Myocardial infarction  N   SOCIAL HISTORY  (Detailed) Tobacco use reviewed. Preferred language is Unknown.   MARITAL STATUS/FAMILY/SOCIAL SUPPORT Currently married.   Smoking status: Never smoker.  SMOKING STATUS Use Status Type Smoking Status Usage Per Day Years Used Total Pack Years  no/never  Never smoker             MEDICATIONS(added, continued or stopped this visit):   ALLERGIES:  Ingredient Reaction Medication Name Comment  AMITRIPTYLINE     CARBIDOPA  Sinemet   HYDROCODONE     SULFA (SULFONAMIDE ANTIBIOTICS)     SERTRALINE HCL  Zoloft   LEVODOPA  Sinemet   NSAIDS (NON-STEROIDAL ANTI-INFLAMMATORY DRUG)     CODEINE     AMITRIPTYLINE HCL  Elavil   MORPHINE     FLAXSEED     ERYTHROMYCIN BASE     New allergies added during this encounter. Active list above.   Vitals Date Temp F BP Pulse Ht In Wt Lb BMI BSA Pain Score  07/13/2013  112/75 89 63.5 185 32.26  3/10        IMPRESSION Patient is complaining of neck and low back pain.  She previously was advised by me to have surgery on her neck.  We have not any recent imaging since that time which was partially 2005.  She is concerned about this and wants to know what is happening with her neck.  Shortly she has significant right leg pain.  To this end I recommended we obtain an MRI of her lumbar and cervical spine and I'll make further recommendations after the studies been performed.  The results of the  studies may recommend physical therapy and nonsurgical treatment.  Assessment/Plan # Detail Type Description   1. Assessment Lumbago (724.2).       2. Assessment Lumbar degenerative disc disease (722.52).       3. 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       4. Assessment Cervical disc disorder w/ radiculopathy (723.4).       5. Assessment Lumbar radiculopathy (724.4).        Pain Assessment/Treatment Pain Scale: 3/10. Method: Numeric Pain Intensity Scale. Pain Assessment/Treatment follow-up plan of care: Patient currently taking pain medication.  Follow-up with me after cervical and lumbar MRIs have been performed.  Orders: Diagnostic Procedures: Assessment Procedure  721.0 MRI Spinal/cerv W/o Contrast  724.4 MRI Spine/lumb With & W/o Contrast             Provider:  Marchia Meiers. Vertell Limber MD  07/17/2013 06:19 PM Dictation edited by: Marchia Meiers. Vertell Limber    CC Providers: Unk Pinto [facility] [address] [place],  [place]  [address]-   Neftali Thurow MD [address] [place], [place] [address] ----------------------------------------------------------------------------------------------------------------------------------------------------------------------         Electronically signed by Marchia Meiers. Vertell Limber MD on 07/17/2013 06:19 PM

## 2015-06-03 ENCOUNTER — Other Ambulatory Visit: Payer: Self-pay | Admitting: Internal Medicine

## 2015-06-04 ENCOUNTER — Other Ambulatory Visit: Payer: Self-pay | Admitting: Physician Assistant

## 2015-06-05 ENCOUNTER — Institutional Professional Consult (permissible substitution): Payer: Medicare Other | Admitting: Neurology

## 2015-06-06 ENCOUNTER — Encounter (HOSPITAL_COMMUNITY)
Admission: RE | Admit: 2015-06-06 | Discharge: 2015-06-06 | Disposition: A | Discharge status: Home / Self Care | Payer: Medicare Other | Source: Ambulatory Visit | Attending: Neurosurgery | Admitting: Neurosurgery

## 2015-06-06 ENCOUNTER — Encounter (HOSPITAL_COMMUNITY): Payer: Self-pay

## 2015-06-06 DIAGNOSIS — M4802 Spinal stenosis, cervical region: Secondary | ICD-10-CM | POA: Diagnosis not present

## 2015-06-06 DIAGNOSIS — Z01812 Encounter for preprocedural laboratory examination: Secondary | ICD-10-CM | POA: Insufficient documentation

## 2015-06-06 HISTORY — DX: Gastrointestinal hemorrhage, unspecified: K92.2

## 2015-06-06 HISTORY — DX: Sleep apnea, unspecified: G47.30

## 2015-06-06 HISTORY — DX: Transient cerebral ischemic attack, unspecified: G45.9

## 2015-06-06 HISTORY — DX: Unspecified osteoarthritis, unspecified site: M19.90

## 2015-06-06 LAB — BASIC METABOLIC PANEL
Anion gap: 9 (ref 5–15)
BUN: 14 mg/dL (ref 6–20)
CO2: 30 mmol/L (ref 22–32)
Calcium: 9.5 mg/dL (ref 8.9–10.3)
Chloride: 100 mmol/L — ABNORMAL LOW (ref 101–111)
Creatinine, Ser: 0.96 mg/dL (ref 0.44–1.00)
GFR calc Af Amer: >60 mL/min (ref >60)
GFR calc non Af Amer: >60 mL/min (ref >60)
Glucose, Bld: 109 mg/dL — ABNORMAL HIGH (ref 65–99)
Potassium: 3.9 mmol/L (ref 3.5–5.1)
Sodium: 139 mmol/L (ref 135–145)

## 2015-06-06 LAB — CBC
HCT: 47.3 % — ABNORMAL HIGH (ref 36.0–46.0)
Hemoglobin: 15.2 g/dL — ABNORMAL HIGH (ref 12.0–15.0)
MCH: 29.1 pg (ref 26.0–34.0)
MCHC: 32.1 g/dL (ref 30.0–36.0)
MCV: 90.6 fL (ref 78.0–100.0)
Platelets: 247 10*3/uL (ref 150–400)
RBC: 5.22 MIL/uL — ABNORMAL HIGH (ref 3.87–5.11)
RDW: 13 % (ref 11.5–15.5)
WBC: 6.6 10*3/uL (ref 4.0–10.5)

## 2015-06-06 LAB — SURGICAL PCR SCREEN
MRSA, PCR: NEGATIVE
Staphylococcus aureus: NEGATIVE

## 2015-06-06 NOTE — Progress Notes (Signed)
REQUESTED LAST EKG AND STRESS TEST FROM  DR. Oneta Rack [phone].

## 2015-06-06 NOTE — Progress Notes (Signed)
RIGHT FOOT SECOND TOE -  LOST NAIL  DRAINING W/ POSSIBLE INFECTION .

## 2015-06-06 NOTE — Pre-Procedure Instructions (Signed)
Crystal [name]  06/06/2015      [id number] - [name], [place] - [name] [address] Phone: [phone] Fax: [phone]  [facility] - [place], [place] - [address] [place] [address] Phone: [phone] Fax: [phone]    Your procedure is scheduled on  Thursday  06/14/15  Report to [facility] Admitting at 530 A.M.  Call this number if you have problems the morning of surgery:  [phone]   Remember:  Do not eat food or drink liquids after midnight.  Take these medicines the morning of surgery with A SIP OF WATER   BACLOFEN, ZIAC/BISOPROLOL, BUPROPION, DEXLANSOPRAZOLE, DULOXETINE, GABAPENTIN, LEVOTHYROXINE, EYE DROPS, OXYBUTYNIN, TRAMADOL IF NEEDED  (STOP ASPIRIN , NO VITAMINS OR HERBAL MEDICINES, PROBIOTIC, ALOE VERA JUICE)   Do not wear jewelry, make-up or nail polish.  Do not wear lotions, powders, or perfumes.  You may wear deodorant.  Do not shave 48 hours prior to surgery.  Men may shave face and neck.  Do not bring valuables to the hospital.  [facility] is not responsible for any belongings or valuables.  Contacts, dentures or bridgework may not be worn into surgery.  Leave your suitcase in the car.  After surgery it may be brought to your room.  For patients admitted to the hospital, discharge time will be determined by your treatment team.  Patients discharged the day of surgery will not be allowed to drive home.   Name and phone number of your driver:   Special instructions:  [name] - Preparing for Surgery  Before surgery, you can play an important role.  Because skin is not sterile, your skin needs to be as free of germs as possible.  You can reduce the number of germs on you skin by washing with CHG (chlorahexidine gluconate) soap before surgery.  CHG is an antiseptic cleaner which kills germs and bonds with the skin to continue killing  germs even after washing.  Please DO NOT use if you have an allergy to CHG or antibacterial soaps.  If your skin becomes reddened/irritated stop using the CHG and inform your nurse when you arrive at Short Stay.  Do not shave (including legs and underarms) for at least 48 hours prior to the first CHG shower.  You may shave your face.  Please follow these instructions carefully:   1.  Shower with CHG Soap the night before surgery and the                                morning of Surgery.  2.  If you choose to wash your hair, wash your hair first as usual with your       normal shampoo.  3.  After you shampoo, rinse your hair and body thoroughly to remove the                      Shampoo.  4.  Use CHG as you would any other liquid soap.  You can apply chg directly       to the skin and wash gently with scrungie or a clean washcloth.  5.  Apply the CHG Soap to your body ONLY FROM THE NECK DOWN.        Do not use on open wounds or open sores.  Avoid contact  with your eyes,       ears, mouth and genitals (private parts).  Wash genitals (private parts)       with your normal soap.  6.  Wash thoroughly, paying special attention to the area where your surgery        will be performed.  7.  Thoroughly rinse your body with warm water from the neck down.  8.  DO NOT shower/wash with your normal soap after using and rinsing off       the CHG Soap.  9.  Pat yourself dry with a clean towel.            10.  Wear clean pajamas.            11.  Place clean sheets on your bed the night of your first shower and do not        sleep with pets.  Day of Surgery  Do not apply any lotions/deoderants the morning of surgery.  Please wear clean clothes to the hospital/surgery center.    Please read over the following fact sheets that you were given. Pain Booklet, Coughing and Deep Breathing, MRSA Information and Surgical Site Infection Prevention

## 2015-06-07 NOTE — H&P (Signed)
Patient ID:   [id number] Patient: Crystal [name]  Date of Birth: [birth year] Visit Type: Office Visit   Date: 05/14/2015 01:30 PM Provider: Marchia Meiers. Vertell Limber MD   This 64 year old female presents for back pain.  History of Present Illness: 1.  back pain  Last seen in March, patient followed up with Dr. Earlean Shawl and was diagnosed with Crohn's disease.  She has been unable to afford new prescriptions, however.  Since that time, she followed up with [facility] about shoulder surgery.  Right shoulder surgery was scheduled and then canceled, as the surgeon wanted patient to have a second opinion of her neck.  A [facility] physician evaluated her neck and recommended surgery.  She returns today to discuss surgery with Dr. Vertell Limber.  She reports episodes of leg weakness, hand weakness, falls and increased neck pain.  MRI January 2015 CANOPY  The patient comes back after having been seen at [facility] she was recommended Surgery because of cervical stenosis.  She has been complaining of bilateral upper extremity and lower knee pain and is having increasing falling and has brisk upper and lower extremity reflexes on examination today.  I reviewed the patient's previous MRI of her cervical spine which does demonstrate significant stenosis at C3 C4, C4 C5, C5 C6, C6 C7 levels.  She is "compression each of these levels.  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  Based on the severity of the patient's pain complaints and the fact that she is falling and the fact that she has cervical stenosis, I recommended proceeding with cervical surgery as was previously recommended at [facility].  She wishes to have this done here as I had done surgery on her in the past.  Because of the long interval from previous cervical imaging I recommended obtaining a new MRI of cervical spine but feel that we can go ahead with planning surgery for her cervical stenosis.      Medical/Surgical/Interim History Reviewed, no  change.  Last detailed document date:07/13/2013.   PAST MEDICAL HISTORY, SURGICAL HISTORY, FAMILY HISTORY, SOCIAL HISTORY AND REVIEW OF SYSTEMS I have reviewed the patient's past medical, surgical, family and social history as well as the comprehensive review of systems as included on the [facility] history form dated 04/26/2014, which I have signed.  Family History: Reviewed, no changes.  Last detailed document: 07/13/2013.   Social History: Tobacco use reviewed. Reviewed, no changes. Last detailed document date: 07/13/2013.      MEDICATIONS(added, continued or stopped this visit): Started Medication Directions Instruction Stopped   acyclovir 800 mg tablet take 1 tablet by oral route 5 times every day for 10 days     Amitiza 24 mcg capsule take 1 capsule by oral route 2 times every day with food and water     bisoprolol 10 mg-hydrochlorothiazide 6.25 mg tablet take 1 tablet by oral route  every day     bupropion HCl XL 300 mg 24 hr tablet, extended release take 1 tablet by oral route  every day     Butrans 10 mcg/hour transdermal patch apply 1 patch by transdermal route  every 7 days     Dexilant 60 mg capsule, delayed release take 1 capsule by oral route  every day for 8 weeks     duloxetine 60 mg capsule,delayed release take 1 capsule by oral route  every day     ergocalciferol (vitamin D2) 50,000 unit capsule take 1 capsule by oral  route  every week     fenofibrate micronized 134 mg capsule take 1 capsule by oral route  every day with food     gabapentin 800 mg tablet take 1 tablet by oral route 4 times every day     lorazepam 2 mg tablet take 1 tablet by oral route  every day as needed     losartan potassium 80m ORAL Take 1 tablet daily     metaxalone 800 mg tablet take 1 tablet by oral route 3 times every day as needed     oxybutynin chloride 5 mg tablet take 1 tablet by oral route 2 times every day     pilocarpine 5 mg tablet take 1 tablet by oral  route 2 times every day     ropinirole 4 mg tablet take 0.5 tablet by oral route  every day     Tirosint 100 mcg capsule take 1 capsule by oral route  every day     tramadol 50 mg tablet take 1 tablet by oral route  every 6 hours as needed     triamcinolone acetonide 55 mcg nasal spray aerosol spray 2 spray by intranasal route  every day in each nostril       ALLERGIES: Ingredient Reaction Medication Name Comment  AMITRIPTYLINE     CARBIDOPA  Sinemet   HYDROCODONE     SULFA (SULFONAMIDE ANTIBIOTICS)     SERTRALINE HCL  Zoloft   LEVODOPA  Sinemet   NSAIDS (NON-STEROIDAL ANTI-INFLAMMATORY DRUG)     CODEINE     AMITRIPTYLINE HCL  Elavil   MORPHINE     FLAXSEED     ERYTHROMYCIN BASE      Reviewed, no changes.    Vitals Date Temp F BP Pulse Ht In Wt Lb BMI BSA Pain Score  05/14/2015  128/81 105 63.5 184 32.08  5/10      IMPRESSION Herniated cervical discs with cervical stenosis and cervical myelopathy at the C3 C4, C4 C5, C5 C6, C6 C7 levels.  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  Completed Orders (this encounter) Order Details Reason Side Interpretation Result Initial Treatment Date Region  Cervical Spine- AP/Lat/Flex/Ex      05/14/2015 All Levels to All Levels  Lifestyle education regarding diet Encouraged to eat a well balanced diet and follow up with primary care physician.         Assessment/Plan # Detail Type Description   1. Assessment Cervicalgia (M54.2).       2. Assessment Cervical myelopathy (G95.9).       3. Assessment Displacement of intervertebral disc of high cervical region (M50.21).       4. Assessment Radiculopathy, cervical region (M54.12).       5. Assessment Spinal stenosis in cervical region (M48.02).       6. Assessment Body mass index (BMI) 32.0-32.9, adult ((B51.02.   Plan Orders Today's instructions / counseling include(s) Lifestyle education regarding diet.         Pain Assessment/Treatment Pain Scale: 5/10. Method: Numeric  Pain Intensity Scale. Location: back. Onset: 12/05/1988. Duration: varies. Quality: discomforting. Pain Assessment/Treatment follow-up plan of care: Patient is taking medications as prescribed..  Fall Risk Plan The Patient has fallen 5 times in the last year.  Falls risk follow-up plan of care: Assisted devices: Advised patient to use handrails..  Patient wishes to proceed with surgery.  She has a lot of shoulder pain which was felt to be coming from her neck by the orthopedist at  [facility].  The plan is for cervical decompression fusion on 06/14/15.  The risks and benefits were discussed in detail with the patient.  She is going to get a repeat MRI of the cervical spine prior to surgery.  I will also obtain 4 view cervical radiographs.  We went over models and specific teaching.  She was fitted for a cervical collar.  She understands risks and benefits and wishes to proceed.  Orders: Diagnostic Procedures: Assessment Procedure  M48.02 ACDF - C3-C4 - C4-C5 - C5-C6 - C6-C7  M48.02 Cervical Spine- AP/Lat/Flex/Ex  M48.02 Cervical Spine- Lateral  M48.02 MRI Spinal/cerv W/o Contrast  Instruction(s)/Education: Assessment Instruction  Z68.32 Lifestyle education regarding diet             Provider:  Marchia Meiers. Vertell Limber MD  05/19/2015 02:11 PM Dictation edited by: Marchia Meiers. Vertell Limber    CC Providers: Unk Pinto [name] [address] [place],  [place]  [id number]   Anisha Starliper MD [address] [place], [place] [address]              Electronically signed by Marchia Meiers. Vertell Limber MD on 05/19/2015 02:11 PM

## 2015-06-12 ENCOUNTER — Other Ambulatory Visit: Payer: Self-pay | Admitting: Internal Medicine

## 2015-06-13 MED ORDER — CEFAZOLIN SODIUM-DEXTROSE 2-3 GM-% IV SOLR
2.0000 g | INTRAVENOUS | Status: AC
  Administered 2015-06-14: 2 g via INTRAVENOUS
  Filled 2015-06-13: qty 50

## 2015-06-13 NOTE — Anesthesia Preprocedure Evaluation (Addendum)
Anesthesia Evaluation  Patient identified by MRN, date of birth, ID band Patient awake    Reviewed: Allergy & Precautions, NPO status , Patient's Chart, lab work & pertinent test results  Airway Mallampati: III  TM Distance: >3 FB Neck ROM: Full    Dental no notable dental hx.    Pulmonary sleep apnea ,    Pulmonary exam normal breath sounds clear to auscultation       Cardiovascular hypertension, Pt. on medications Normal cardiovascular exam Rhythm:Regular Rate:Normal     Neuro/Psych PSYCHIATRIC DISORDERS Depression TIA   GI/Hepatic Neg liver ROS, GERD  ,  Endo/Other  diabetes, Type 2Hypothyroidism   Renal/GU negative Renal ROS     Musculoskeletal  (+) Arthritis , Fibromyalgia -  Abdominal (+) + obese,   Peds  Hematology negative hematology ROS (+)   Anesthesia Other Findings   Reproductive/Obstetrics negative OB ROS                           Anesthesia Physical Anesthesia Plan  ASA: III  Anesthesia Plan: General   Post-op Pain Management:    Induction: Intravenous  Airway Management Planned: Oral ETT  Additional Equipment:   Intra-op Plan:   Post-operative Plan: Extubation in OR  Informed Consent: I have reviewed the patients History and Physical, chart, labs and discussed the procedure including the risks, benefits and alternatives for the proposed anesthesia with the patient or authorized representative who has indicated his/her understanding and acceptance.   Dental advisory given  Plan Discussed with: CRNA  Anesthesia Plan Comments:         Anesthesia Quick Evaluation

## 2015-06-14 ENCOUNTER — Inpatient Hospital Stay (HOSPITAL_COMMUNITY): Payer: Medicare Other | Admitting: Anesthesiology

## 2015-06-14 ENCOUNTER — Inpatient Hospital Stay (HOSPITAL_COMMUNITY): Payer: Medicare Other

## 2015-06-14 ENCOUNTER — Encounter (HOSPITAL_COMMUNITY)
Admission: RE | Disposition: A | Discharge status: Home / Self Care | Payer: Self-pay | Source: Ambulatory Visit | Attending: Neurosurgery

## 2015-06-14 ENCOUNTER — Inpatient Hospital Stay (HOSPITAL_COMMUNITY)
Admission: RE | Admit: 2015-06-14 | Discharge: 2015-06-15 | DRG: 472 | Disposition: A | Discharge status: Home / Self Care | Payer: Medicare Other | Source: Ambulatory Visit | Attending: Neurosurgery | Admitting: Neurosurgery

## 2015-06-14 ENCOUNTER — Encounter (HOSPITAL_COMMUNITY): Payer: Self-pay | Admitting: Anesthesiology

## 2015-06-14 DIAGNOSIS — M4802 Spinal stenosis, cervical region: Secondary | ICD-10-CM | POA: Diagnosis present

## 2015-06-14 DIAGNOSIS — E039 Hypothyroidism, unspecified: Secondary | ICD-10-CM | POA: Diagnosis present

## 2015-06-14 DIAGNOSIS — Z886 Allergy status to analgesic agent status: Secondary | ICD-10-CM | POA: Diagnosis not present

## 2015-06-14 DIAGNOSIS — K219 Gastro-esophageal reflux disease without esophagitis: Secondary | ICD-10-CM | POA: Diagnosis present

## 2015-06-14 DIAGNOSIS — M4712 Other spondylosis with myelopathy, cervical region: Secondary | ICD-10-CM | POA: Diagnosis present

## 2015-06-14 DIAGNOSIS — I10 Essential (primary) hypertension: Secondary | ICD-10-CM | POA: Diagnosis present

## 2015-06-14 DIAGNOSIS — Z7982 Long term (current) use of aspirin: Secondary | ICD-10-CM

## 2015-06-14 DIAGNOSIS — Z885 Allergy status to narcotic agent status: Secondary | ICD-10-CM | POA: Diagnosis not present

## 2015-06-14 DIAGNOSIS — Z881 Allergy status to other antibiotic agents status: Secondary | ICD-10-CM | POA: Diagnosis not present

## 2015-06-14 DIAGNOSIS — E669 Obesity, unspecified: Secondary | ICD-10-CM | POA: Diagnosis present

## 2015-06-14 DIAGNOSIS — Z882 Allergy status to sulfonamides status: Secondary | ICD-10-CM

## 2015-06-14 DIAGNOSIS — G4730 Sleep apnea, unspecified: Secondary | ICD-10-CM | POA: Diagnosis present

## 2015-06-14 DIAGNOSIS — Z419 Encounter for procedure for purposes other than remedying health state, unspecified: Secondary | ICD-10-CM

## 2015-06-14 DIAGNOSIS — Z888 Allergy status to other drugs, medicaments and biological substances status: Secondary | ICD-10-CM

## 2015-06-14 DIAGNOSIS — M797 Fibromyalgia: Secondary | ICD-10-CM | POA: Diagnosis present

## 2015-06-14 DIAGNOSIS — Z8673 Personal history of transient ischemic attack (TIA), and cerebral infarction without residual deficits: Secondary | ICD-10-CM | POA: Diagnosis not present

## 2015-06-14 DIAGNOSIS — Z8249 Family history of ischemic heart disease and other diseases of the circulatory system: Secondary | ICD-10-CM

## 2015-06-14 DIAGNOSIS — M50121 Cervical disc disorder at C4-C5 level with radiculopathy: Secondary | ICD-10-CM | POA: Diagnosis present

## 2015-06-14 DIAGNOSIS — F329 Major depressive disorder, single episode, unspecified: Secondary | ICD-10-CM | POA: Diagnosis present

## 2015-06-14 DIAGNOSIS — E7800 Pure hypercholesterolemia, unspecified: Secondary | ICD-10-CM | POA: Diagnosis present

## 2015-06-14 DIAGNOSIS — Q761 Klippel-Feil syndrome: Secondary | ICD-10-CM | POA: Diagnosis not present

## 2015-06-14 DIAGNOSIS — M5116 Intervertebral disc disorders with radiculopathy, lumbar region: Secondary | ICD-10-CM | POA: Diagnosis present

## 2015-06-14 DIAGNOSIS — G510 Bell's palsy: Secondary | ICD-10-CM | POA: Diagnosis present

## 2015-06-14 DIAGNOSIS — Z823 Family history of stroke: Secondary | ICD-10-CM

## 2015-06-14 DIAGNOSIS — Z9181 History of falling: Secondary | ICD-10-CM | POA: Diagnosis not present

## 2015-06-14 DIAGNOSIS — Z803 Family history of malignant neoplasm of breast: Secondary | ICD-10-CM | POA: Diagnosis not present

## 2015-06-14 DIAGNOSIS — E119 Type 2 diabetes mellitus without complications: Secondary | ICD-10-CM | POA: Diagnosis present

## 2015-06-14 DIAGNOSIS — Z6832 Body mass index (BMI) 32.0-32.9, adult: Secondary | ICD-10-CM | POA: Diagnosis not present

## 2015-06-14 DIAGNOSIS — K5090 Crohn's disease, unspecified, without complications: Secondary | ICD-10-CM | POA: Diagnosis present

## 2015-06-14 HISTORY — PX: ANTERIOR CERVICAL DECOMPRESSION/DISCECTOMY FUSION 4 LEVELS: SHX5556

## 2015-06-14 LAB — GLUCOSE, CAPILLARY: Glucose-Capillary: 155 mg/dL — ABNORMAL HIGH (ref 65–99)

## 2015-06-14 SURGERY — ANTERIOR CERVICAL DECOMPRESSION/DISCECTOMY FUSION 4 LEVELS
Anesthesia: General

## 2015-06-14 MED ORDER — METHOCARBAMOL 500 MG PO TABS
500.0000 mg | ORAL_TABLET | Freq: Four times a day (QID) | ORAL | Status: DC | PRN
  Administered 2015-06-15: 500 mg via ORAL
  Filled 2015-06-14: qty 1

## 2015-06-14 MED ORDER — SUCCINYLCHOLINE CHLORIDE 20 MG/ML IJ SOLN
INTRAMUSCULAR | Status: AC
  Filled 2015-06-14: qty 1

## 2015-06-14 MED ORDER — LOSARTAN POTASSIUM 50 MG PO TABS
50.0000 mg | ORAL_TABLET | Freq: Every day | ORAL | Status: DC
  Administered 2015-06-15: 50 mg via ORAL
  Filled 2015-06-14: qty 1

## 2015-06-14 MED ORDER — TRAMADOL HCL 50 MG PO TABS: 50.0000 mg | ORAL_TABLET | Freq: Four times a day (QID) | ORAL | Status: DC | PRN

## 2015-06-14 MED ORDER — ROPINIROLE HCL 1 MG PO TABS
4.0000 mg | ORAL_TABLET | Freq: Every day | ORAL | Status: DC
  Administered 2015-06-14: 2 mg via ORAL
  Filled 2015-06-14 (×2): qty 4

## 2015-06-14 MED ORDER — FENTANYL CITRATE (PF) 100 MCG/2ML IJ SOLN
INTRAMUSCULAR | Status: DC | PRN
  Administered 2015-06-14 (×3): 50 ug via INTRAVENOUS
  Administered 2015-06-14: 100 ug via INTRAVENOUS
  Administered 2015-06-14 (×2): 50 ug via INTRAVENOUS
  Administered 2015-06-14: 100 ug via INTRAVENOUS
  Administered 2015-06-14: 50 ug via INTRAVENOUS

## 2015-06-14 MED ORDER — LIDOCAINE-EPINEPHRINE 1 %-1:100000 IJ SOLN
INTRAMUSCULAR | Status: DC | PRN
  Administered 2015-06-14: 10 mL

## 2015-06-14 MED ORDER — SODIUM CHLORIDE 0.9 % IJ SOLN
3.0000 mL | Freq: Two times a day (BID) | INTRAMUSCULAR | Status: DC
  Administered 2015-06-14: 3 mL via INTRAVENOUS

## 2015-06-14 MED ORDER — SCOPOLAMINE 1 MG/3DAYS TD PT72
MEDICATED_PATCH | TRANSDERMAL | Status: DC | PRN
  Administered 2015-06-14: 1 via TRANSDERMAL

## 2015-06-14 MED ORDER — DOCUSATE SODIUM 100 MG PO CAPS
100.0000 mg | ORAL_CAPSULE | Freq: Two times a day (BID) | ORAL | Status: DC
  Administered 2015-06-14 – 2015-06-15 (×2): 100 mg via ORAL
  Filled 2015-06-14 (×2): qty 1

## 2015-06-14 MED ORDER — FENOFIBRATE 54 MG PO TABS
54.0000 mg | ORAL_TABLET | Freq: Every day | ORAL | Status: DC
  Administered 2015-06-15: 54 mg via ORAL
  Filled 2015-06-14: qty 1

## 2015-06-14 MED ORDER — MENTHOL 3 MG MT LOZG: 1.0000 | LOZENGE | OROMUCOSAL | Status: DC | PRN

## 2015-06-14 MED ORDER — BISOPROLOL-HYDROCHLOROTHIAZIDE 10-6.25 MG PO TABS
1.0000 | ORAL_TABLET | Freq: Every day | ORAL | Status: DC
Start: 2015-06-14 — End: 2015-06-15
  Administered 2015-06-14 – 2015-06-15 (×2): 1 via ORAL
  Filled 2015-06-14 (×2): qty 1

## 2015-06-14 MED ORDER — KCL IN DEXTROSE-NACL 20-5-0.45 MEQ/L-%-% IV SOLN: INTRAVENOUS | Status: DC

## 2015-06-14 MED ORDER — BUPIVACAINE HCL (PF) 0.5 % IJ SOLN
INTRAMUSCULAR | Status: DC | PRN
  Administered 2015-06-14: 10 mL

## 2015-06-14 MED ORDER — MIDAZOLAM HCL 2 MG/2ML IJ SOLN
INTRAMUSCULAR | Status: AC
  Filled 2015-06-14: qty 2

## 2015-06-14 MED ORDER — 0.9 % SODIUM CHLORIDE (POUR BTL) OPTIME
TOPICAL | Status: DC | PRN
  Administered 2015-06-14: 1000 mL

## 2015-06-14 MED ORDER — FLEET ENEMA 7-19 GM/118ML RE ENEM: 1.0000 | ENEMA | Freq: Once | RECTAL | Status: DC | PRN

## 2015-06-14 MED ORDER — ACYCLOVIR 800 MG PO TABS
800.0000 mg | ORAL_TABLET | Freq: Four times a day (QID) | ORAL | Status: DC
  Administered 2015-06-14 – 2015-06-15 (×3): 800 mg via ORAL
  Filled 2015-06-14 (×6): qty 1

## 2015-06-14 MED ORDER — SCOPOLAMINE 1 MG/3DAYS TD PT72
MEDICATED_PATCH | TRANSDERMAL | Status: AC
  Filled 2015-06-14: qty 1

## 2015-06-14 MED ORDER — PROPOFOL 10 MG/ML IV BOLUS
INTRAVENOUS | Status: DC | PRN
  Administered 2015-06-14: 160 mg via INTRAVENOUS

## 2015-06-14 MED ORDER — LORATADINE 10 MG PO TABS: 10.0000 mg | ORAL_TABLET | Freq: Every evening | ORAL | Status: DC | PRN

## 2015-06-14 MED ORDER — ONDANSETRON HCL 4 MG/2ML IJ SOLN
INTRAMUSCULAR | Status: DC | PRN
  Administered 2015-06-14: 4 mg via INTRAVENOUS

## 2015-06-14 MED ORDER — DEXAMETHASONE SODIUM PHOSPHATE 10 MG/ML IJ SOLN
INTRAMUSCULAR | Status: DC | PRN
  Administered 2015-06-14: 10 mg via INTRAVENOUS

## 2015-06-14 MED ORDER — SUCCINYLCHOLINE CHLORIDE 20 MG/ML IJ SOLN
INTRAMUSCULAR | Status: DC | PRN
  Administered 2015-06-14: 100 mg via INTRAVENOUS

## 2015-06-14 MED ORDER — LEVOTHYROXINE SODIUM 100 MCG PO TABS
100.0000 ug | ORAL_TABLET | Freq: Every day | ORAL | Status: DC
  Administered 2015-06-15: 100 ug via ORAL
  Filled 2015-06-14: qty 1

## 2015-06-14 MED ORDER — SUGAMMADEX SODIUM 200 MG/2ML IV SOLN
INTRAVENOUS | Status: DC | PRN
  Administered 2015-06-14: 200 mg via INTRAVENOUS

## 2015-06-14 MED ORDER — ACETAMINOPHEN 325 MG PO TABS: 650.0000 mg | ORAL_TABLET | ORAL | Status: DC | PRN

## 2015-06-14 MED ORDER — LUBIPROSTONE 24 MCG PO CAPS
24.0000 ug | ORAL_CAPSULE | Freq: Two times a day (BID) | ORAL | Status: DC
  Administered 2015-06-14 – 2015-06-15 (×2): 24 ug via ORAL
  Filled 2015-06-14 (×4): qty 1

## 2015-06-14 MED ORDER — PROCHLORPERAZINE MALEATE 5 MG PO TABS: 5.0000 mg | ORAL_TABLET | Freq: Four times a day (QID) | ORAL | Status: DC | PRN

## 2015-06-14 MED ORDER — DEXAMETHASONE SODIUM PHOSPHATE 10 MG/ML IJ SOLN
INTRAMUSCULAR | Status: AC
  Filled 2015-06-14: qty 1

## 2015-06-14 MED ORDER — FENTANYL CITRATE (PF) 250 MCG/5ML IJ SOLN
INTRAMUSCULAR | Status: AC
  Filled 2015-06-14: qty 10

## 2015-06-14 MED ORDER — SODIUM CHLORIDE 0.9 % IV SOLN
10.0000 mg | INTRAVENOUS | Status: DC | PRN
  Administered 2015-06-14: 25 ug/min via INTRAVENOUS

## 2015-06-14 MED ORDER — ARTIFICIAL TEARS OP OINT
TOPICAL_OINTMENT | OPHTHALMIC | Status: DC | PRN
  Administered 2015-06-14: 1 via OPHTHALMIC

## 2015-06-14 MED ORDER — VECURONIUM BROMIDE 10 MG IV SOLR
INTRAVENOUS | Status: DC | PRN
  Administered 2015-06-14: 2 mg via INTRAVENOUS

## 2015-06-14 MED ORDER — DULOXETINE HCL 30 MG PO CPEP
60.0000 mg | ORAL_CAPSULE | Freq: Every day | ORAL | Status: DC
  Administered 2015-06-15: 60 mg via ORAL
  Filled 2015-06-14: qty 2

## 2015-06-14 MED ORDER — ONDANSETRON HCL 4 MG/2ML IJ SOLN
INTRAMUSCULAR | Status: AC
  Filled 2015-06-14: qty 2

## 2015-06-14 MED ORDER — BUPRENORPHINE 10 MCG/HR TD PTWK: 10.0000 ug | MEDICATED_PATCH | TRANSDERMAL | Status: DC

## 2015-06-14 MED ORDER — ZOLPIDEM TARTRATE 5 MG PO TABS: 5.0000 mg | ORAL_TABLET | Freq: Every evening | ORAL | Status: DC | PRN

## 2015-06-14 MED ORDER — ASPIRIN 81 MG PO CHEW
81.0000 mg | CHEWABLE_TABLET | Freq: Every day | ORAL | Status: DC
  Administered 2015-06-14 – 2015-06-15 (×2): 81 mg via ORAL
  Filled 2015-06-14 (×2): qty 1

## 2015-06-14 MED ORDER — MEPERIDINE HCL 25 MG/ML IJ SOLN: 6.2500 mg | INTRAMUSCULAR | Status: DC | PRN

## 2015-06-14 MED ORDER — HYDROMORPHONE HCL 1 MG/ML IJ SOLN
0.5000 mg | INTRAMUSCULAR | Status: DC | PRN
Start: 2015-06-14 — End: 2015-06-15
  Administered 2015-06-14: 1 mg via INTRAVENOUS
  Filled 2015-06-14: qty 1

## 2015-06-14 MED ORDER — THROMBIN 5000 UNITS EX SOLR
OROMUCOSAL | Status: DC | PRN
  Administered 2015-06-14: 09:00:00 via TOPICAL

## 2015-06-14 MED ORDER — ROCURONIUM BROMIDE 50 MG/5ML IV SOLN
INTRAVENOUS | Status: AC
  Filled 2015-06-14: qty 1

## 2015-06-14 MED ORDER — POLYETHYLENE GLYCOL 3350 17 G PO PACK
17.0000 g | PACK | Freq: Every day | ORAL | Status: DC | PRN
Start: 2015-06-14 — End: 2015-06-15

## 2015-06-14 MED ORDER — OXYCODONE-ACETAMINOPHEN 5-325 MG PO TABS
1.0000 | ORAL_TABLET | ORAL | Status: DC | PRN
  Administered 2015-06-14 – 2015-06-15 (×5): 2 via ORAL
  Filled 2015-06-14 (×5): qty 2

## 2015-06-14 MED ORDER — DULOXETINE HCL 30 MG PO CPEP: 60.0000 mg | ORAL_CAPSULE | Freq: Every day | ORAL | Status: DC

## 2015-06-14 MED ORDER — PROBIOTIC ADVANCED PO CAPS: ORAL_CAPSULE | Freq: Every day | ORAL | Status: DC

## 2015-06-14 MED ORDER — OXYBUTYNIN CHLORIDE 5 MG PO TABS
5.0000 mg | ORAL_TABLET | Freq: Two times a day (BID) | ORAL | Status: DC
  Administered 2015-06-14 – 2015-06-15 (×2): 5 mg via ORAL
  Filled 2015-06-14 (×3): qty 1

## 2015-06-14 MED ORDER — LACTATED RINGERS IV SOLN
INTRAVENOUS | Status: DC | PRN
  Administered 2015-06-14 (×2): via INTRAVENOUS

## 2015-06-14 MED ORDER — BACLOFEN 20 MG PO TABS
20.0000 mg | ORAL_TABLET | Freq: Two times a day (BID) | ORAL | Status: DC
  Administered 2015-06-14 – 2015-06-15 (×2): 20 mg via ORAL
  Filled 2015-06-14 (×3): qty 1

## 2015-06-14 MED ORDER — PANTOPRAZOLE SODIUM 40 MG PO TBEC: 40.0000 mg | DELAYED_RELEASE_TABLET | Freq: Every day | ORAL | Status: DC

## 2015-06-14 MED ORDER — SODIUM CHLORIDE 0.9 % IV SOLN: 250.0000 mL | INTRAVENOUS | Status: DC

## 2015-06-14 MED ORDER — CYCLOBENZAPRINE HCL 10 MG PO TABS
10.0000 mg | ORAL_TABLET | Freq: Three times a day (TID) | ORAL | Status: DC | PRN
  Administered 2015-06-14 – 2015-06-15 (×2): 10 mg via ORAL
  Filled 2015-06-14 (×2): qty 1

## 2015-06-14 MED ORDER — ACETAMINOPHEN 650 MG RE SUPP: 650.0000 mg | RECTAL | Status: DC | PRN

## 2015-06-14 MED ORDER — HYDROMORPHONE HCL 1 MG/ML IJ SOLN: 0.2500 mg | INTRAMUSCULAR | Status: DC | PRN

## 2015-06-14 MED ORDER — PANTOPRAZOLE SODIUM 40 MG IV SOLR
40.0000 mg | Freq: Every day | INTRAVENOUS | Status: DC
  Administered 2015-06-14: 40 mg via INTRAVENOUS
  Filled 2015-06-14: qty 40

## 2015-06-14 MED ORDER — ONDANSETRON HCL 4 MG/2ML IJ SOLN: 4.0000 mg | INTRAMUSCULAR | Status: DC | PRN

## 2015-06-14 MED ORDER — PROPOFOL 10 MG/ML IV BOLUS
INTRAVENOUS | Status: AC
  Filled 2015-06-14: qty 40

## 2015-06-14 MED ORDER — LIDOCAINE HCL (CARDIAC) 20 MG/ML IV SOLN
INTRAVENOUS | Status: DC | PRN
  Administered 2015-06-14: 50 mg via INTRAVENOUS

## 2015-06-14 MED ORDER — METHOCARBAMOL 1000 MG/10ML IJ SOLN: 500.0000 mg | Freq: Four times a day (QID) | INTRAVENOUS | Status: DC | PRN

## 2015-06-14 MED ORDER — VITAMIN D (ERGOCALCIFEROL) 1.25 MG (50000 UNIT) PO CAPS
50000.0000 [IU] | ORAL_CAPSULE | ORAL | Status: DC
  Administered 2015-06-15: 50000 [IU] via ORAL
  Filled 2015-06-14: qty 1

## 2015-06-14 MED ORDER — ROCURONIUM BROMIDE 100 MG/10ML IV SOLN
INTRAVENOUS | Status: DC | PRN
  Administered 2015-06-14: 50 mg via INTRAVENOUS

## 2015-06-14 MED ORDER — GABAPENTIN 400 MG PO CAPS
800.0000 mg | ORAL_CAPSULE | Freq: Three times a day (TID) | ORAL | Status: DC
  Administered 2015-06-14 – 2015-06-15 (×3): 800 mg via ORAL
  Filled 2015-06-14 (×3): qty 2

## 2015-06-14 MED ORDER — ALUM & MAG HYDROXIDE-SIMETH 200-200-20 MG/5ML PO SUSP: 30.0000 mL | Freq: Four times a day (QID) | ORAL | Status: DC | PRN

## 2015-06-14 MED ORDER — SUGAMMADEX SODIUM 200 MG/2ML IV SOLN
INTRAVENOUS | Status: AC
  Filled 2015-06-14: qty 2

## 2015-06-14 MED ORDER — BISACODYL 10 MG RE SUPP: 10.0000 mg | Freq: Every day | RECTAL | Status: DC | PRN

## 2015-06-14 MED ORDER — SODIUM CHLORIDE 0.9 % IJ SOLN: 3.0000 mL | INTRAMUSCULAR | Status: DC | PRN

## 2015-06-14 MED ORDER — NEOMYCIN-POLYMYXIN-HC 3.5-10000-1 OT SOLN: 3.0000 [drp] | Freq: Three times a day (TID) | OTIC | Status: DC

## 2015-06-14 MED ORDER — BUPROPION HCL ER (XL) 300 MG PO TB24
300.0000 mg | ORAL_TABLET | Freq: Every day | ORAL | Status: DC
  Administered 2015-06-15: 300 mg via ORAL
  Filled 2015-06-14: qty 1

## 2015-06-14 MED ORDER — PROMETHAZINE HCL 25 MG/ML IJ SOLN: 6.2500 mg | INTRAMUSCULAR | Status: DC | PRN

## 2015-06-14 MED ORDER — LIDOCAINE HCL (CARDIAC) 20 MG/ML IV SOLN
INTRAVENOUS | Status: AC
  Filled 2015-06-14: qty 10

## 2015-06-14 MED ORDER — PILOCARPINE HCL 5 MG PO TABS
5.0000 mg | ORAL_TABLET | Freq: Two times a day (BID) | ORAL | Status: DC
  Administered 2015-06-14 – 2015-06-15 (×2): 5 mg via ORAL
  Filled 2015-06-14 (×3): qty 1

## 2015-06-14 MED ORDER — MIDAZOLAM HCL 5 MG/5ML IJ SOLN
INTRAMUSCULAR | Status: DC | PRN
  Administered 2015-06-14: 2 mg via INTRAVENOUS

## 2015-06-14 MED ORDER — CEFAZOLIN SODIUM-DEXTROSE 2-3 GM-% IV SOLR
2.0000 g | Freq: Three times a day (TID) | INTRAVENOUS | Status: AC
  Administered 2015-06-14 (×2): 2 g via INTRAVENOUS
  Filled 2015-06-14 (×2): qty 50

## 2015-06-14 MED ORDER — THROMBIN 20000 UNITS EX SOLR
CUTANEOUS | Status: DC | PRN
  Administered 2015-06-14: 09:00:00 via TOPICAL

## 2015-06-14 MED ORDER — FAMOTIDINE 20 MG PO TABS
20.0000 mg | ORAL_TABLET | Freq: Two times a day (BID) | ORAL | Status: DC
  Administered 2015-06-14 – 2015-06-15 (×2): 20 mg via ORAL
  Filled 2015-06-14 (×2): qty 1

## 2015-06-14 MED ORDER — PHENOL 1.4 % MT LIQD: 1.0000 | OROMUCOSAL | Status: DC | PRN

## 2015-06-14 MED ORDER — LIDOCAINE HCL (CARDIAC) 20 MG/ML IV SOLN
INTRAVENOUS | Status: AC
  Filled 2015-06-14: qty 5

## 2015-06-14 MED ORDER — FLUTICASONE PROPIONATE 50 MCG/ACT NA SUSP
2.0000 | Freq: Every day | NASAL | Status: DC
  Administered 2015-06-14 – 2015-06-15 (×2): 2 via NASAL
  Filled 2015-06-14: qty 16

## 2015-06-14 SURGICAL SUPPLY — 75 items
ADH SKN CLS APL DERMABOND .7 (GAUZE/BANDAGES/DRESSINGS) ×1
APL SKNCLS STERI-STRIP NONHPOA (GAUZE/BANDAGES/DRESSINGS)
BENZOIN TINCTURE PRP APPL 2/3 (GAUZE/BANDAGES/DRESSINGS) IMPLANT
BIT DRILL NEURO 2X3.1 SFT TUCH (MISCELLANEOUS) ×1 IMPLANT
BIT DRILL POWER (BIT) IMPLANT
BLADE ULTRA TIP 2M (BLADE) IMPLANT
BNDG GAUZE ELAST 4 BULKY (GAUZE/BANDAGES/DRESSINGS) IMPLANT
BUR BARREL STRAIGHT FLUTE 4.0 (BURR) ×2 IMPLANT
CAGE COROENT SM 6X13X15 (Cage) ×1 IMPLANT
CAGE SMALL 7X13X15 (Cage) ×3 IMPLANT
CANISTER SUCT 3000ML PPV (MISCELLANEOUS) ×2 IMPLANT
COVER MAYO STAND STRL (DRAPES) ×2 IMPLANT
DECANTER SPIKE VIAL GLASS SM (MISCELLANEOUS) ×2 IMPLANT
DERMABOND ADVANCED (GAUZE/BANDAGES/DRESSINGS) ×1
DERMABOND ADVANCED .7 DNX12 (GAUZE/BANDAGES/DRESSINGS) ×1 IMPLANT
DRAIN JACKSON PRATT 10MM FLAT (MISCELLANEOUS) ×1 IMPLANT
DRAPE LAPAROTOMY 100X72 PEDS (DRAPES) ×2 IMPLANT
DRAPE MICROSCOPE LEICA (MISCELLANEOUS) ×2 IMPLANT
DRAPE POUCH INSTRU U-SHP 10X18 (DRAPES) ×2 IMPLANT
DRAPE PROXIMA HALF (DRAPES) IMPLANT
DRILL BIT POWER (BIT) ×2
DRILL NEURO 2X3.1 SOFT TOUCH (MISCELLANEOUS) ×2
DRSG OPSITE POSTOP 3X4 (GAUZE/BANDAGES/DRESSINGS) ×1 IMPLANT
DURAPREP 6ML APPLICATOR 50/CS (WOUND CARE) ×2 IMPLANT
ELECT COATED BLADE 2.86 ST (ELECTRODE) ×2 IMPLANT
ELECT REM PT RETURN 9FT ADLT (ELECTROSURGICAL) ×2
ELECTRODE REM PT RTRN 9FT ADLT (ELECTROSURGICAL) ×1 IMPLANT
EVACUATOR SILICONE 100CC (DRAIN) ×1 IMPLANT
GAUZE SPONGE 4X4 12PLY STRL (GAUZE/BANDAGES/DRESSINGS) IMPLANT
GAUZE SPONGE 4X4 16PLY XRAY LF (GAUZE/BANDAGES/DRESSINGS) IMPLANT
GLOVE BIO SURGEON STRL SZ8 (GLOVE) ×2 IMPLANT
GLOVE BIOGEL PI IND STRL 8 (GLOVE) ×1 IMPLANT
GLOVE BIOGEL PI IND STRL 8.5 (GLOVE) ×1 IMPLANT
GLOVE BIOGEL PI INDICATOR 8 (GLOVE) ×1
GLOVE BIOGEL PI INDICATOR 8.5 (GLOVE) ×1
GLOVE ECLIPSE 7.5 STRL STRAW (GLOVE) ×2 IMPLANT
GLOVE EXAM NITRILE LRG STRL (GLOVE) IMPLANT
GLOVE EXAM NITRILE MD LF STRL (GLOVE) IMPLANT
GLOVE EXAM NITRILE XL STR (GLOVE) IMPLANT
GLOVE EXAM NITRILE XS STR PU (GLOVE) IMPLANT
GOWN STRL REUS W/ TWL LRG LVL3 (GOWN DISPOSABLE) IMPLANT
GOWN STRL REUS W/ TWL XL LVL3 (GOWN DISPOSABLE) IMPLANT
GOWN STRL REUS W/TWL 2XL LVL3 (GOWN DISPOSABLE) IMPLANT
GOWN STRL REUS W/TWL LRG LVL3 (GOWN DISPOSABLE)
GOWN STRL REUS W/TWL XL LVL3 (GOWN DISPOSABLE) ×2
HALTER HD/CHIN CERV TRACTION D (MISCELLANEOUS) ×2 IMPLANT
HEMOSTAT POWDER SURGIFOAM 1G (HEMOSTASIS) ×1 IMPLANT
KIT BASIN OR (CUSTOM PROCEDURE TRAY) ×2 IMPLANT
KIT ROOM TURNOVER OR (KITS) ×2 IMPLANT
NDL HYPO 18GX1.5 BLUNT FILL (NEEDLE) ×1 IMPLANT
NDL HYPO 25X1 1.5 SAFETY (NEEDLE) ×1 IMPLANT
NDL SPNL 22GX3.5 QUINCKE BK (NEEDLE) ×2 IMPLANT
NEEDLE HYPO 18GX1.5 BLUNT FILL (NEEDLE) ×2 IMPLANT
NEEDLE HYPO 25X1 1.5 SAFETY (NEEDLE) ×2 IMPLANT
NEEDLE SPNL 22GX3.5 QUINCKE BK (NEEDLE) ×6 IMPLANT
NS IRRIG 1000ML POUR BTL (IV SOLUTION) ×2 IMPLANT
PACK LAMINECTOMY NEURO (CUSTOM PROCEDURE TRAY) ×2 IMPLANT
PAD ARMBOARD 7.5X6 YLW CONV (MISCELLANEOUS) ×2 IMPLANT
PIN DISTRACTION 14MM (PIN) ×5 IMPLANT
PLATE ARCHON 4-LEVEL 68MM (Plate) ×1 IMPLANT
PUTTY DBM ALLOFUSE 2.5CC (Bone Implant) ×1 IMPLANT
RUBBERBAND STERILE (MISCELLANEOUS) ×4 IMPLANT
SCREW ARCHON SELFTAP 4.0X13 (Screw) ×8 IMPLANT
SCREW ARCHON SELFTAP 4.0X13MM (Screw) ×2 IMPLANT
SPONGE INTESTINAL PEANUT (DISPOSABLE) ×3 IMPLANT
SPONGE SURGIFOAM ABS GEL 100 (HEMOSTASIS) IMPLANT
STAPLER SKIN PROX WIDE 3.9 (STAPLE) ×1 IMPLANT
STRIP CLOSURE SKIN 1/2X4 (GAUZE/BANDAGES/DRESSINGS) IMPLANT
SUT VIC AB 3-0 SH 8-18 (SUTURE) ×3 IMPLANT
SYR 3ML LL SCALE MARK (SYRINGE) ×2 IMPLANT
TOWEL OR 17X24 6PK STRL BLUE (TOWEL DISPOSABLE) ×2 IMPLANT
TOWEL OR 17X26 10 PK STRL BLUE (TOWEL DISPOSABLE) ×3 IMPLANT
TRAP SPECIMEN MUCOUS 40CC (MISCELLANEOUS) ×1 IMPLANT
TRAY FOLEY W/METER SILVER 14FR (SET/KITS/TRAYS/PACK) ×1 IMPLANT
WATER STERILE IRR 1000ML POUR (IV SOLUTION) ×2 IMPLANT

## 2015-06-14 NOTE — Brief Op Note (Signed)
06/14/2015  11:17 AM  PATIENT:  Crystal [name]  64 y.o. female  PRE-OPERATIVE DIAGNOSIS:  Spinal stenosis in cervical region, cervical spondylosis with myelopathy, herniated cervical discs, cervical radiculopathy C 34, C 45, C 56, C67 levels  POST-OPERATIVE DIAGNOSIS:  Spinal stenosis in cervical region, cervical spondylosis with myelopathy, herniated cervical discs, cervical radiculopathy C 34, C 45, C 56, C67 levels   PROCEDURE:  Procedure(s) with comments: Cervical three-four Cervical four-five Cervical five- six Cervical six- seven  Anterior cervical decompression/diskectomy/fusion (N/A) - C3-4 C4-5 C5-6 C6-7 Anterior cervical decompression/diskectomy/fusion with PEEK cages, autograft, allograft, anterior cervical plate  SURGEON:  Surgeon(s) and Role:    * Erline Levine, MD - Primary    * Kevan Ny Ditty, MD - Assisting  PHYSICIAN ASSISTANT:   ASSISTANTS: Poteat, RN   ANESTHESIA:   general  EBL:  Total I/O In: 1000 [I.V.:1000] Out: 500 [Urine:400; Blood:100]  BLOOD ADMINISTERED:none  DRAINS: (10) Jackson-Pratt drain(s) with closed bulb suction in the prevertebral space   LOCAL MEDICATIONS USED:  LIDOCAINE   SPECIMEN:  No Specimen  DISPOSITION OF SPECIMEN:  N/A  COUNTS:  YES  TOURNIQUET:  * No tourniquets in log *  DICTATION: Patient was brought to operating room and following the smooth and uncomplicated induction of general endotracheal anesthesia her head was placed on a horseshoe head holder she was placed in 5 pounds of Holter traction and her anterior neck was prepped and draped in usual sterile fashion. An incision was made on the left side of midline after infiltrating the skin and subcutaneous tissues with local lidocaine. The platysmal layer was incised and subplatysmal dissection was performed exposing the anterior border sternocleidomastoid muscle. Using blunt dissection the carotid sheath was kept lateral and trachea and esophagus kept medial exposing  the anterior cervical spine. A bent spinal needle was placed it was felt to be the C3-4 level and this was confirmed on intraoperative x-ray. Longus coli muscles were taken down from the anterior cervical spine using electrocautery and key elevator and self-retaining retractor was placed. The interspaces from C3/4 to C6/7  were incised and a thorough discectomies were performed. Distraction pins were placed at the C3/4 level. Uncinate spurs and central spondylitic ridges were drilled down with a high-speed drill. The spinal cord dura and both C4 nerve roots were widely decompressed. Hemostasis was assured. After trial sizing an 7 mm peek interbody cage was selected and packed with autograft and DBM. The graft was tamped into position and countersunk appropriately.  Distraction pins were placed at the C4/5 level. Uncinate spurs and central spondylitic ridges were drilled down with a high-speed drill. The spinal cord dura and both C5 nerve roots were widely decompressed. Hemostasis was assured. After trial sizing an 7 mm peek interbody cage was selected and packed in a similar fashion. The graft was tamped into position and countersunk appropriately. The retractor was moved and the interspace at C6/7 was incised and a thorough discectomy was performed. Distraction pins were placed. Uncinate spurs and central spondylitic ridges were drilled down with a high-speed drill. The spinal cord dura and both C7 nerve roots were widely decompressed. Hemostasis was assured. After trial sizing a 7 mm peek interbody cage was selected and packed in a similar fashion. The graft was tamped into position and countersunk appropriately.The interspace at C5/6 was incised and a thorough discectomy was performed. Distraction pins were placed. Uncinate spurs and central spondylitic ridges were drilled down with a high-speed drill.   This interspace was overgrown  with bone and it was drilled open with the high speed drill. The spinal cord  dura and both C6 nerve roots were widely decompressed. Hemostasis was assured. After trial sizing an 6 mm peek interbody cage was selected and packed with profuse block and autograft. The graft was tamped into position and countersunk appropriately.  Distraction weight was removed. A 68 mm Nuvasive Archon anterior cervical plate was affixed to the cervical spine with 13 mm variable-angle screws 2 at C3, 2 at C4, 2 at C5,  and 2 at C6, and 2 at C7.  All screws were well-positioned and locking mechanisms were engaged. Soft tissues were inspected and found to be in good repair. The wound was irrigated. A final x-ray was obtained with good visualization at C3 through C4 with the uppermost interbody graft well visualized. A #10 JP drain was inserted through a separate stab incision. The platysma layer was closed with 3-0 Vicryl stitches and the skin was reapproximated with 3-0 Vicryl subcuticular stitches. The wound was dressed with benzoin and steristrips. Counts were correct at the end of the case. Patient was extubated and taken to recovery in stable and satisfactory condition.   PLAN OF CARE: Admit to inpatient   PATIENT DISPOSITION:  PACU - hemodynamically stable.   Delay start of Pharmacological VTE agent (>24hrs) due to surgical blood loss or risk of bleeding: yes

## 2015-06-14 NOTE — Op Note (Signed)
06/14/2015  11:17 AM  PATIENT:  Crystal [name]  64 y.o. female  PRE-OPERATIVE DIAGNOSIS:  Spinal stenosis in cervical region, cervical spondylosis with myelopathy, herniated cervical discs, cervical radiculopathy C 34, C 45, C 56, C67 levels  POST-OPERATIVE DIAGNOSIS:  Spinal stenosis in cervical region, cervical spondylosis with myelopathy, herniated cervical discs, cervical radiculopathy C 34, C 45, C 56, C67 levels   PROCEDURE:  Procedure(s) with comments: Cervical three-four Cervical four-five Cervical five- six Cervical six- seven  Anterior cervical decompression/diskectomy/fusion (N/A) - C3-4 C4-5 C5-6 C6-7 Anterior cervical decompression/diskectomy/fusion with PEEK cages, autograft, allograft, anterior cervical plate  SURGEON:  Surgeon(s) and Role:    * Erline Levine, MD - Primary    * Kevan Ny Ditty, MD - Assisting  PHYSICIAN ASSISTANT:   ASSISTANTS: Poteat, RN   ANESTHESIA:   general  EBL:  Total I/O In: 1000 [I.V.:1000] Out: 500 [Urine:400; Blood:100]  BLOOD ADMINISTERED:none  DRAINS: (10) Jackson-Pratt drain(s) with closed bulb suction in the prevertebral space   LOCAL MEDICATIONS USED:  LIDOCAINE   SPECIMEN:  No Specimen  DISPOSITION OF SPECIMEN:  N/A  COUNTS:  YES  TOURNIQUET:  * No tourniquets in log *  DICTATION: Patient was brought to operating room and following the smooth and uncomplicated induction of general endotracheal anesthesia her head was placed on a horseshoe head holder she was placed in 5 pounds of Holter traction and her anterior neck was prepped and draped in usual sterile fashion. An incision was made on the left side of midline after infiltrating the skin and subcutaneous tissues with local lidocaine. The platysmal layer was incised and subplatysmal dissection was performed exposing the anterior border sternocleidomastoid muscle. Using blunt dissection the carotid sheath was kept lateral and trachea and esophagus kept medial exposing  the anterior cervical spine. A bent spinal needle was placed it was felt to be the C3-4 level and this was confirmed on intraoperative x-ray. Longus coli muscles were taken down from the anterior cervical spine using electrocautery and key elevator and self-retaining retractor was placed. The interspaces from C3/4 to C6/7  were incised and a thorough discectomies were performed. Distraction pins were placed at the C3/4 level. Uncinate spurs and central spondylitic ridges were drilled down with a high-speed drill. The spinal cord dura and both C4 nerve roots were widely decompressed. Hemostasis was assured. After trial sizing an 7 mm peek interbody cage was selected and packed with autograft and DBM. The graft was tamped into position and countersunk appropriately.  Distraction pins were placed at the C4/5 level. Uncinate spurs and central spondylitic ridges were drilled down with a high-speed drill. The spinal cord dura and both C5 nerve roots were widely decompressed. Hemostasis was assured. After trial sizing an 7 mm peek interbody cage was selected and packed in a similar fashion. The graft was tamped into position and countersunk appropriately. The retractor was moved and the interspace at C6/7 was incised and a thorough discectomy was performed. Distraction pins were placed. Uncinate spurs and central spondylitic ridges were drilled down with a high-speed drill. The spinal cord dura and both C7 nerve roots were widely decompressed. Hemostasis was assured. After trial sizing a 7 mm peek interbody cage was selected and packed in a similar fashion. The graft was tamped into position and countersunk appropriately.The interspace at C5/6 was incised and a thorough discectomy was performed. Distraction pins were placed. Uncinate spurs and central spondylitic ridges were drilled down with a high-speed drill.   This interspace was overgrown  with bone and it was drilled open with the high speed drill. The spinal cord  dura and both C6 nerve roots were widely decompressed. Hemostasis was assured. After trial sizing an 6 mm peek interbody cage was selected and packed with profuse block and autograft. The graft was tamped into position and countersunk appropriately.  Distraction weight was removed. A 68 mm Nuvasive Archon anterior cervical plate was affixed to the cervical spine with 13 mm variable-angle screws 2 at C3, 2 at C4, 2 at C5,  and 2 at C6, and 2 at C7.  All screws were well-positioned and locking mechanisms were engaged. Soft tissues were inspected and found to be in good repair. The wound was irrigated. A final x-ray was obtained with good visualization at C3 through C4 with the uppermost interbody graft well visualized. A #10 JP drain was inserted through a separate stab incision. The platysma layer was closed with 3-0 Vicryl stitches and the skin was reapproximated with 3-0 Vicryl subcuticular stitches. The wound was dressed with benzoin and steristrips. Counts were correct at the end of the case. Patient was extubated and taken to recovery in stable and satisfactory condition.   PLAN OF CARE: Admit to inpatient   PATIENT DISPOSITION:  PACU - hemodynamically stable.   Delay start of Pharmacological VTE agent (>24hrs) due to surgical blood loss or risk of bleeding: yes

## 2015-06-14 NOTE — Interval H&P Note (Signed)
History and Physical Interval Note:  06/14/2015 7:59 AM  Crystal [name]  has presented today for surgery, with the diagnosis of Spinal stenosis in cervical region  The various methods of treatment have been discussed with the patient and family. After consideration of risks, benefits and other options for treatment, the patient has consented to  Procedure(s) with comments: C3-4 C4-5 C5-6 C6-7 Anterior cervical decompression/diskectomy/fusion (N/A) - C3-4 C4-5 C5-6 C6-7 Anterior cervical decompression/diskectomy/fusion as a surgical intervention .  The patient's history has been reviewed, patient examined, no change in status, stable for surgery.  I have reviewed the patient's chart and labs.  Questions were answered to the patient's satisfaction.     Bethania Schlotzhauer D

## 2015-06-14 NOTE — Transfer of Care (Signed)
Immediate Anesthesia Transfer of Care Note  Patient: Crystal [name]  Procedure(s) Performed: Procedure(s) with comments: Cervical three-four Cervical four-five Cervical five- six Cervical six- seven  Anterior cervical decompression/diskectomy/fusion (N/A) - C3-4 C4-5 C5-6 C6-7 Anterior cervical decompression/diskectomy/fusion  Patient Location: PACU  Anesthesia Type:General  Level of Consciousness: awake, oriented, sedated, patient cooperative and responds to stimulation  Airway & Oxygen Therapy: Patient Spontanous Breathing and Patient connected to face mask oxygen  Post-op Assessment: Report given to RN, Post -op Vital signs reviewed and stable, Patient moving all extremities and Patient moving all extremities X 4  Post vital signs: Reviewed and stable  Last Vitals:  Filed Vitals:   06/14/15 1140 06/14/15 1141  BP:  123/66  Pulse:  95  Temp: 36.1 C   Resp:  21    Complications: No apparent anesthesia complications

## 2015-06-14 NOTE — Progress Notes (Signed)
Pt states pain is pretty severe when asked but pt can barely hold eyes open and I am having to remind pt to breathe. Will reposition for comfort.

## 2015-06-14 NOTE — Anesthesia Postprocedure Evaluation (Signed)
Anesthesia Post Note  Patient: Crystal [name]  Procedure(s) Performed: Procedure(s) (LRB): Cervical three-four Cervical four-five Cervical five- six Cervical six- seven  Anterior cervical decompression/diskectomy/fusion (N/A)  Patient location during evaluation: PACU Anesthesia Type: General Level of consciousness: sedated and patient cooperative Pain management: pain level controlled Vital Signs Assessment: post-procedure vital signs reviewed and stable Respiratory status: spontaneous breathing Cardiovascular status: stable Anesthetic complications: no    Last Vitals:  Filed Vitals:   06/14/15 1241 06/14/15 1256  BP: 126/71 129/75  Pulse: 87 85  Temp:    Resp: 15 11    Last Pain:  Filed Vitals:   06/14/15 1258  PainSc: 0-No pain                 Lewie Loron

## 2015-06-14 NOTE — Progress Notes (Signed)
Placed sacral dressing , area has a pain patch and red area that looks like irritation from old patch.

## 2015-06-14 NOTE — Progress Notes (Signed)
Awake, alert, conversant.  MAEW with full bilateral D/B/T/HI strength.  Doing well.

## 2015-06-14 NOTE — Progress Notes (Signed)
Utilization review completed.  

## 2015-06-14 NOTE — Anesthesia Procedure Notes (Signed)
Procedure Name: Intubation Date/Time: 06/14/2015 8:10 AM Performed by: Wray Kearns A Pre-anesthesia Checklist: Patient identified, Timeout performed, Emergency Drugs available, Patient being monitored and Suction available Patient Re-evaluated:Patient Re-evaluated prior to inductionOxygen Delivery Method: Circle system utilized Preoxygenation: Pre-oxygenation with 100% oxygen Intubation Type: IV induction Ventilation: Mask ventilation without difficulty Laryngoscope Size: Mac, 4 and Glidescope Grade View: Grade I Tube type: Oral Tube size: 7.5 mm Number of attempts: 1 Airway Equipment and Method: Rigid stylet Placement Confirmation: ETT inserted through vocal cords under direct vision,  breath sounds checked- equal and bilateral and positive ETCO2 Secured at: 21 cm Tube secured with: Tape Dental Injury: Teeth and Oropharynx as per pre-operative assessment  Difficulty Due To: Difficulty was anticipated, Difficult Airway- due to reduced neck mobility, Difficult Airway- due to anterior larynx and Difficult Airway- due to limited oral opening Future Recommendations: Recommend- induction with short-acting agent, and alternative techniques readily available

## 2015-06-15 ENCOUNTER — Encounter (HOSPITAL_COMMUNITY): Payer: Self-pay | Admitting: Neurosurgery

## 2015-06-15 MED ORDER — PANTOPRAZOLE SODIUM 40 MG PO TBEC: 40.0000 mg | DELAYED_RELEASE_TABLET | Freq: Every day | ORAL | Status: DC

## 2015-06-15 NOTE — Evaluation (Signed)
Physical Therapy Evaluation  Patient Details Name: Crystal [name] MRN: [id number] DOB: 01/06/1951 Today's Date: 06/15/2015   History of Present Illness  64 yo female s/p C3-4 C4-5 C5-6 C6-7 with aspen hard collar PMH: HTN Fibromyalgia, L knee surg,  Clinical Impression  Pt admitted with above diagnosis. Pt currently with functional limitations due to the deficits listed below (see PT Problem List). At the time of PT eval pt was able to perform transfers and ambulation with close guard for safety as pt somewhat unsteady with OOB. Pt had LOB on stairs requiring heavy min assist to recover. Encouraged pt to take it slow at home. Pt will benefit from skilled PT to increase their independence and safety with mobility to allow discharge to the venue listed below.       Follow Up Recommendations Outpatient PT    Equipment Recommendations  None recommended by PT    Recommendations for Other Services       Precautions / Restrictions Precautions Precautions: Cervical Precaution Comments: handout provided and reviewed in detail Required Braces or Orthoses: Cervical Brace Cervical Brace: Hard collar;At all times Restrictions Weight Bearing Restrictions: No      Mobility  Bed Mobility Overal bed mobility: Modified Independent             General bed mobility comments: Pt received sitting up in recliner.   Transfers Overall transfer level: Needs assistance Equipment used: None Transfers: Sit to/from Stand Sit to Stand: Min guard         General transfer comment: Close guard for safety as pt appeared somewhat unsteady upon 1st stand.   Ambulation/Gait Ambulation/Gait assistance: Min guard Ambulation Distance (Feet): 500 Feet Assistive device: None Gait Pattern/deviations: Step-through pattern;Decreased stride length Gait velocity: Decreased Gait velocity interpretation: Below normal speed for age/gender General Gait Details: Close guard for safety. Occasional  unsteadiness noted however pt was able to recover independently.   Stairs Stairs: Yes Stairs assistance: Min assist Stair Management: No rails;One rail Right;Forwards Number of Stairs: 9 General stair comments: Pt did not require assistance with railing use. To simulate entrance into home, pt attempted 3 steps without rails and lost balance. Min assist required to recover.   Wheelchair Mobility    Modified Rankin (Stroke Patients Only)       Balance Overall balance assessment: Needs assistance Sitting-balance support: Feet supported;No upper extremity supported Sitting balance-Leahy Scale: Fair     Standing balance support: No upper extremity supported;During functional activity Standing balance-Leahy Scale: Poor Standing balance comment: Occasional hands-on assist required to steady.                              Pertinent Vitals/Pain Pain Assessment: Faces Faces Pain Scale: Hurts little more Pain Location: RUE and neck Pain Descriptors / Indicators: Operative site guarding;Discomfort Pain Intervention(s): Limited activity within patient's tolerance;Monitored during session;Repositioned    Home Living Family/patient expects to be discharged to:: Private residence Living Arrangements: Spouse/significant other Available Help at Discharge: Family;Available 24 hours/day Type of Home: House Home Access: Stairs to enter Entrance Stairs-Rails: None Entrance Stairs-Number of Steps: 3 Home Layout: Multi-level;Bed/bath upstairs Home Equipment: Walker - 2 wheels;Cane - single point;Bedside commode;Shower seat;Wheelchair - power;Grab bars - tub/shower (suction cup grab bars in bathroom) Additional Comments: pt will have (A) from spouse upon d/c. pt has electric w/c that was fathers but reports inability to use in the home    Prior Function Level of Independence: Independent  Hand Dominance   Dominant Hand: Left    Extremity/Trunk Assessment    Upper Extremity Assessment: Defer to OT evaluation RUE Deficits / Details: reports no numbness or tingling. Pt reports PTA deficits at shoulder. pt able to AROM 90 shoulder flexion. Pt also reports carpal tunnel deficits. pt with decr gross motor for turning aspen collar brace to change chin positioning.once in chair sitting for several minutes reports some changes in the R UE ( shooting pain)     LUE Deficits / Details: reports no numbness or tingling. Pt reports PTA deficits at shoulder. pt able to AROM 90 shoulder flexion. decr gross motor for turning aspen collar chin adjustment.    Lower Extremity Assessment: Overall WFL for tasks assessed (Slight decreased strength but overall WFL)      Cervical / Trunk Assessment: Other exceptions  Communication   Communication: No difficulties  Cognition Arousal/Alertness: Awake/alert Behavior During Therapy: WFL for tasks assessed/performed (Distracts easily) Overall Cognitive Status: Within Functional Limits for tasks assessed                      General Comments General comments (skin integrity, edema, etc.): incision covered and intact    Exercises        Assessment/Plan    PT Assessment Patient needs continued PT services  PT Diagnosis Difficulty walking;Acute pain   PT Problem List Decreased strength;Decreased range of motion;Decreased activity tolerance;Decreased balance;Decreased mobility;Decreased knowledge of use of DME;Decreased safety awareness;Decreased knowledge of precautions;Pain  PT Treatment Interventions DME instruction;Stair training;Gait training;Functional mobility training;Therapeutic activities;Therapeutic exercise;Neuromuscular re-education;Patient/family education   PT Goals (Current goals can be found in the Care Plan section) Acute Rehab PT Goals Patient Stated Goal: to have christmas with family PT Goal Formulation: With patient Time For Goal Achievement: 06/22/15 Potential to Achieve Goals: Good     Frequency Min 5X/week   Barriers to discharge        Co-evaluation               End of Session Equipment Utilized During Treatment: Cervical collar Activity Tolerance: Patient tolerated treatment well Patient left: in bed;with call bell/phone within reach Nurse Communication: Mobility status         Time: 5300-5110 PT Time Calculation (min) (ACUTE ONLY): 15 min   Charges:   PT Evaluation $Initial PT Evaluation Tier I: 1 Procedure     PT G Codes:        Conni Slipper Jul 15, 2015, 10:30 AM   Conni Slipper, PT, DPT Acute Rehabilitation Services Pager: [phone]

## 2015-06-15 NOTE — Evaluation (Signed)
Occupational Therapy Evaluation Patient Details Name: Crystal [name] MRN: [id number] DOB: [birth year] Today's Date: 06/15/2015    History of Present Illness 64 yo female s/p C3-4 C4-5 C5-6 C6-7 with aspen hard collar PMH: HTN Fibromyalgia, L knee surg,   Clinical Impression   Patient is s/p C3-4 C4-5 C5-6 C6-7 surgery resulting in functional limitations due to the deficits listed below (see OT problem list). PTA mod I for all adls. Patient will benefit from skilled OT acutely to increase independence and safety with ADLS to allow discharge home with spouse support.     Follow Up Recommendations  No OT follow up    Equipment Recommendations  None recommended by OT    Recommendations for Other Services       Precautions / Restrictions Precautions Precautions: Cervical Precaution Comments: handout provided and reviewed in detail Required Braces or Orthoses: Cervical Brace Cervical Brace: Hard collar;At all times      Mobility Bed Mobility Overal bed mobility: Modified Independent             General bed mobility comments: exiting R side  Transfers Overall transfer level: Needs assistance   Transfers: Sit to/from Stand Sit to Stand: Min guard              Balance                                            ADL Overall ADL's : Needs assistance/impaired     Grooming: Wash/dry face;Wash/dry hands;Minimal Biomedical engineer: Min guard;Ambulation;BSC           Functional mobility during ADLs: Min guard General ADL Comments: pt will have spouse (A) upon d/c. educated on don doff of brace. pt asking questions about opening christmas gifts and pt educated on proper seating and alignment     Vision Additional Comments: noted to close R eye adn look with L eye   Perception     Praxis      Pertinent Vitals/Pain Pain Assessment: Faces Faces Pain Scale: Hurts little more Pain Location: R Ue and  neck Pain Descriptors / Indicators: Discomfort;Shooting Pain Intervention(s): Repositioned;Premedicated before session;Monitored during session     Hand Dominance Left   Extremity/Trunk Assessment Upper Extremity Assessment Upper Extremity Assessment: RUE deficits/detail;LUE deficits/detail RUE Deficits / Details: reports no numbness or tingling. Pt reports PTA deficits at shoulder. pt able to AROM 90 shoulder flexion. Pt also reports carpal tunnel deficits. pt with decr gross motor for turning aspen collar brace to change chin positioning.once in chair sitting for several minutes reports some changes in the R UE ( shooting pain) RUE Coordination: decreased gross motor LUE Deficits / Details: reports no numbness or tingling. Pt reports PTA deficits at shoulder. pt able to AROM 90 shoulder flexion. decr gross motor for turning aspen collar chin adjustment.    Lower Extremity Assessment Lower Extremity Assessment: Defer to PT evaluation   Cervical / Trunk Assessment Cervical / Trunk Assessment: Other exceptions (s/p surg)   Communication Communication Communication: No difficulties   Cognition Arousal/Alertness: Awake/alert Behavior During Therapy: WFL for tasks assessed/performed Overall Cognitive Status: Within Functional Limits for tasks assessed                     General Comments  Exercises       Shoulder Instructions      Home Living Family/patient expects to be discharged to:: Private residence Living Arrangements: Spouse/significant other Available Help at Discharge: Family;Available 24 hours/day Type of Home: House Home Access: Stairs to enter [employer] of Steps: 3 Entrance Stairs-Rails: None Home Layout: Multi-level;Bed/bath upstairs Alternate Level Stairs-Number of Steps: 6   Bathroom Shower/Tub: Chief Strategy Officer: Standard     Home Equipment: Environmental consultant - 2 wheels;Cane - single point;Bedside commode;Shower  seat;Wheelchair - power;Grab bars - tub/shower (suction cup grab bars in bathroom)   Additional Comments: pt will have (A) from spouse upon d/c. pt has electric w/c that was fathers but reports inability to use in the home      Prior Functioning/Environment Level of Independence: Independent             OT Diagnosis: Generalized weakness;Acute pain   OT Problem List: Decreased strength;Decreased activity tolerance;Impaired balance (sitting and/or standing);Decreased safety awareness;Decreased knowledge of use of DME or AE;Decreased knowledge of precautions;Pain   OT Treatment/Interventions: Self-care/ADL training;Therapeutic exercise;DME and/or AE instruction;Therapeutic activities;Patient/family education;Balance training    OT Goals(Current goals can be found in the care plan section) Acute Rehab OT Goals Patient Stated Goal: to have christmas with family OT Goal Formulation: With patient Time For Goal Achievement: 06/29/15 Potential to Achieve Goals: Good  OT Frequency: Min 2X/week   Barriers to D/C:            Co-evaluation              End of Session Nurse Communication: Mobility status;Precautions  Activity Tolerance: Patient tolerated treatment well Patient left: in chair;with call bell/phone within reach   Time: 5027-7412 OT Time Calculation (min): 29 min Charges:  OT General Charges $OT Visit: 1 Procedure OT Evaluation $Initial OT Evaluation Tier I: 1 Procedure OT Treatments $Self Care/Home Management : 8-22 mins G-Codes:    Boone Master B Jun 27, 2015, 9:08 AM  Pager: [phone]

## 2015-06-15 NOTE — Progress Notes (Signed)
Pt and husband given D/C instructions with Rx's, verbal understanding was provided. Pt's incision is covered with Honeycomb dressing and has a scant amount of drainage. Pt's IV and JP drain were removed prior to D/C. Pt D/C'd home via wheelchair @1515  per MD order. Pt is stable @ D/C and has no other needs at this time. Rema Fendt, RN

## 2015-06-15 NOTE — Discharge Instructions (Signed)
Wound Care °Leave incision open to air. °You may shower. °Do not scrub directly on incision.  °Do not put any creams, lotions, or ointments on incision. °Activity °Walk each and every day, increasing distance each day. °No lifting greater than 5 lbs.  Avoid excessive neck motion. °No driving for 2 weeks; may ride as a passenger locally. °Wear neck brace at all times except when showering.  If provided soft collar, may wear for comfort unless otherwise instructed. °Diet °Resume your normal diet.  °Return to Work °Will be discussed at you follow up appointment. °Call Your Doctor If Any of These Occur °Redness, drainage, or swelling at the wound.  °Temperature greater than 101 degrees. °Severe pain not relieved by pain medication. °Increased difficulty swallowing. °Incision starts to come apart. °Follow Up Appt °Call today for appointment in 3-4 weeks ([phone]) or for problems.  If you have any hardware placed in your spine, you will need an x-ray before your appointment. ° ° °Anterior Cervical Diskectomy and Fusion °Anterior cervical diskectomy and fusion is a surgery that is done on the neck (cervical spine) to take pressure off of the nerves or the spinal cord. It is performed through the front (anterior) part of the neck. During this surgery, the damaged disk that is causing pain, numbness, or weakness is removed. The area where the disk was removed is filled with a plastic spacer implant, a bone graft, or both. These implants and bone grafts take pressure off of the nerves and spinal cord by making more room for the nerves to leave the spine. °LET YOUR HEALTH CARE PROVIDER KNOW ABOUT: °· Any allergies you have. °· All medicines you are taking, including vitamins, herbs, eye drops, creams, and over-the-counter medicines. °· Previous problems you or members of your family have had with the use of anesthetics. °· Any blood disorders you have. °· Previous surgeries you have had. °· Any medical conditions you may  have. °RISKS AND COMPLICATIONS °Generally, this is a safe procedure. However, problems may occur, including: °· Infection. °· Bleeding with the possible need for blood transfusion. °· Injury to surrounding structures, including nerves. °· Leakage of fluid from the brain or spinal cord (cerebrospinal fluid). °· Blood clots. °· Temporary breathing difficulties after surgery. °BEFORE THE PROCEDURE °· Follow your health care provider's instructions about eating or drinking restrictions. °· Ask your health care provider about: °¨ Changing or stopping your regular medicines. This is especially important if you are taking diabetes medicines or blood thinners. °¨ Taking medicines such as aspirin and ibuprofen. These medicines can thin your blood. Do not take these medicines before your procedure if your health care provider instructs you not to. °· You may be given antibiotic medicines to help prevent infection. °· Your incision site may be marked on your neck. °PROCEDURE °· An IV tube will be inserted into one of your veins. °· You will be given one or more of the following: °¨ A medicine that helps you relax (sedative). °¨ A medicine that makes you fall asleep (general anesthetic). °· A breathing tube will be placed. °· Your neck will be cleaned with a germ-killing solution (antiseptic). °· Your surgeon will make an incision on the front of your neck, usually within a skinfold line. °· Your neck muscles will be spread apart, and the damaged disk and bone spurs will be removed. °· The area where the disk was removed will be filled with a small plastic spacer implant, a bone graft, or both. °· Your surgeon   may put metal plates and screws (hardware) in your neck. This helps to stabilize the surgical site and keep implants and bone grafts in place. The hardware reduces motion at the surgical site so your bones can grow together (fuse). This provides extra support to your neck. °· The incision will be closed with stitches  (sutures). °· A bandage (dressing) will be applied to cover the incision. °The procedure may vary among health care providers and hospitals. °AFTER THE PROCEDURE °· Your blood pressure, heart rate, breathing rate, and blood oxygen level will be monitored often until the medicines you were given have worn off.  °· You will be monitored for any signs of complications from the procedure, such as: °¨ Too much bleeding from the incision site. °¨ A buildup of blood under your skin at the surgical site. °¨ Difficulty breathing. °· You may continue to receive antibiotics. °· You can start to eat as soon as you feel comfortable. °· You may be given a neck brace to wear after surgery. This brace limits your neck movement while your bones are fusing together. Follow your health care provider's instructions about how often and how long you need to wear this. °  °This information is not intended to replace advice given to you by your health care provider. Make sure you discuss any questions you have with your health care provider. °  °Document Released: 06/04/2009 Document Revised: 07/07/2014 Document Reviewed: 06/04/2009 °Elsevier Interactive Patient Education ©2016 [employer] ° °

## 2015-06-15 NOTE — Progress Notes (Signed)
Subjective: Patient reports "I feel stronger in my arms and shoulders. The pain is much better too"  Objective: Vital signs in last 24 hours: Temp:  [97 F (36.1 C)-98.8 F (37.1 C)] 97.9 F (36.6 C) (12/16 0407) Pulse Rate:  [72-95] 72 (12/16 0407) Resp:  [7-21] 18 (12/16 0407) BP: (100-140)/(50-81) 119/57 mmHg (12/16 0407) SpO2:  [90 %-97 %] 94 % (12/16 0407)  Intake/Output from previous day: 12/15 0701 - 12/16 0700 In: 2000 [I.V.:2000] Out: 2450 [Urine:2250; Drains:100; Blood:100] Intake/Output this shift:    Awakens to voice. Reports mild posterior cervical/bilat shoulder pain, noting much improved from pre-op. Incision with Honeycomb over Dermabond. No erythema, swelling, or drainage. JP drain patent ~32ml overnight. Strength is good BUE.   Lab Results: No results for input(s): WBC, HGB, HCT, PLT in the last 72 hours. BMET No results for input(s): NA, K, CL, CO2, GLUCOSE, BUN, CREATININE, CALCIUM in the last 72 hours.  Studies/Results: Dg Cervical Spine 2-3 Views  06/14/2015  CLINICAL DATA:  Cervical spine surgery. EXAM: CERVICAL SPINE - 2-3 VIEW COMPARISON:  MRI 06/07/2015. FINDINGS: Metallic marker noted initially at the C3-C4 level. C3-C4 and C4-C5 anterior fusion. Lower cervical spine not well imaged. IMPRESSION: Postsurgical changes cervical spine. Electronically Signed   By: Maisie Fus  Register   On: 06/14/2015 11:45    Assessment/Plan: Improved    LOS: 1 day  Per DrStern, d/c drain, d/c IV, d/c to home. Drain pulled, dsd to site. Rx's to chart for Norco (pt states she tolerates better than Oxycodone) and Flexeril. She has Baclofen at home for daytime spasm relief. Pt verbalizes understanding of d/c instructions and agrees to call office to schedule 3-4 wk f/u visit.    Crystal [name] 06/15/2015, 7:44 AM

## 2015-06-15 NOTE — Discharge Summary (Signed)
Physician Discharge Summary  Patient ID: Crystal [name] MRN: [id number] DOB/AGE: 64-Crystal [name].  Admit date: 06/14/2015 Discharge date: 06/15/2015  Admission Diagnoses: Spinal stenosis in cervical region, cervical spondylosis with myelopathy, herniated cervical discs, cervical radiculopathy C 34, C 45, C 56, C67 levels   Discharge Diagnoses: Spinal stenosis in cervical region, cervical spondylosis with myelopathy, herniated cervical discs, cervical radiculopathy C 34, C 45, C 56, C67 levels s/p Cervical three-four Cervical four-five Cervical five- six Cervical six- seven  Anterior cervical decompression/diskectomy/fusion (N/A) - C3-4 C4-5 C5-6 C6-7 Anterior cervical decompression/diskectomy/fusion with PEEK cages, autograft, allograft, anterior cervical plate  Active Problems:   Spinal stenosis in cervical region   Discharged Condition: good  Hospital Course: Crystal [name] was admitted for surgery with dx spondylosis and myelopathy.  Following uncomplicated ACDF C3-7, she recovered well and transferred to [facility] for nursing care. She has progressed nicely.   Consults: None  Significant Diagnostic Studies: radiology: X-Ray: intra-op  Treatments: surgery: Cervical three-four Cervical four-five Cervical five- six Cervical six- seven  Anterior cervical decompression/diskectomy/fusion (N/A) - C3-4 C4-5 C5-6 C6-7 Anterior cervical decompression/diskectomy/fusion with PEEK cages, autograft, allograft, anterior cervical plate   Discharge Exam: Blood pressure 119/57, pulse 72, temperature 97.9 F (36.6 C), temperature source Oral, resp. rate 18, SpO2 94 %. Awakens to voice. Reports mild posterior cervical/bilat shoulder pain, noting much improved from pre-op. Incision with Honeycomb over Dermabond. No erythema, swelling, or drainage. JP drain patent ~43ml overnight. Strength is good BUE.    Disposition: Discharge to home. Drained pulled, dsd to site. Rx's to chart for Norco (pt states  she tolerates better than Oxycodone) and Flexeril. She has Baclofen at home for daytime spasm relief. Pt verbalizes understanding of d/c instructions and agrees to call office to schedule 3-4 wk f/u visit.       Medication List    ASK your doctor about these medications        acyclovir 800 MG tablet  Commonly known as:  ZOVIRAX  take 1 tablet by mouth 3 times daily before meals and 2 at bedtime     aspirin 81 MG tablet  Take 81 mg by mouth daily.     baclofen 20 MG tablet  Commonly known as:  LIORESAL  Take 1 tablet 2 x daily at 7 AM and 2 PM for muscle spasm     bisoprolol-hydrochlorothiazide 10-6.25 MG tablet  Commonly known as:  ZIAC  TAKE ONE TABLET BY MOUTH ONE TIME DAILY FOR BLOOD PRESSURE     buprenorphine 10 MCG/HR Ptwk patch  Commonly known as:  BUTRANS - dosed mcg/hr  Place 10 mcg onto the skin once a week.     buPROPion 300 MG 24 hr tablet  Commonly known as:  WELLBUTRIN XL  Take 300 mg by mouth daily.     cetirizine 10 MG tablet  Commonly known as:  ZYRTEC  TAKE 1 TABLET (10 MG TOTAL) BY MOUTH AT BEDTIME.     cyclobenzaprine 10 MG tablet  Commonly known as:  FLEXERIL  TAKE 1 TABLET BY MOUTH EVERY 8HRS AS NEEDED FOR MUSCLE SPASMS     dexlansoprazole 60 MG capsule  Commonly known as:  DEXILANT  Take 1 capsule (60 mg total) by mouth daily.     doxycycline 100 MG capsule  Commonly known as:  VIBRAMYCIN  TAKE 2 CAPS NOW ON A FULL STOMACH, THEN TAKE 1 CAPSULE 2 TIMES DAILY ON A FULL STOMACH FOR INFECTION     DULoxetine 60 MG capsule  Commonly known as:  CYMBALTA  Take 1 capsule (60 mg total) by mouth daily.     DULoxetine 60 MG capsule  Commonly known as:  CYMBALTA  TAKE 1 CAPSULE BY MOUTH DAILY.     fenofibrate micronized 134 MG capsule  Commonly known as:  LOFIBRA  TAKE 1 CAPSULE BY MOUTH DAILY BEFORE BREAKFAST     fluticasone 50 MCG/ACT nasal spray  Commonly known as:  FLONASE  USE TWO SPRAYS IN EACH NOSTRIL DAILY AS NEEDED     gabapentin 800  MG tablet  Commonly known as:  NEURONTIN  TAKE ONE TABLET BY MOUTH FOUR TIMES DAILY     levothyroxine 100 MCG tablet  Commonly known as:  SYNTHROID, LEVOTHROID  TAKE 1 TABLET BY MOUTH ON TUES, WED, THURS, SAT AND SUN. TAKE 1& 1/2 TABLET ON MON AND FRI     losartan 50 MG tablet  Commonly known as:  COZAAR  Take 1 tablet (50 mg total) by mouth daily.     lubiprostone 24 MCG capsule  Commonly known as:  AMITIZA  Take 24 mcg by mouth 2 (two) times daily with a meal.     neomycin-polymyxin-hydrocortisone otic solution  Commonly known as:  CORTISPORIN  6  - 8 drops to affected ear 4 x daily for Otitis Externa     OVER THE COUNTER MEDICATION  Take 1 tablet by mouth daily. OSTEO MATRIX     OVER THE COUNTER MEDICATION  ALEO VERA JUICE - DRINK ONCE DAILY IN THE MORNING     oxybutynin 5 MG tablet  Commonly known as:  DITROPAN  take 1 tablet two times a day     pilocarpine 5 MG tablet  Commonly known as:  SALAGEN  Take 1 tablet (5 mg total) by mouth 2 (two) times daily.     PROBIOTIC ADVANCED PO  Take 1 capsule by mouth daily. ULTIMATE PROBIOTIC     prochlorperazine 5 MG tablet  Commonly known as:  COMPAZINE  Take 1 tablet (5 mg total) by mouth every 6 (six) hours as needed for nausea or vomiting.     ranitidine 150 MG tablet  Commonly known as:  ZANTAC  Take 150 mg by mouth at bedtime.     rOPINIRole 4 MG tablet  Commonly known as:  REQUIP  TAKE 1 TABLET BY MOUTH AT BEDTIME FOR RESTLESS LEG     TRAMADOL HCL PO  Take by mouth as needed.     Vitamin D (Ergocalciferol) 50000 UNITS Caps capsule  Commonly known as:  DRISDOL  Take 1 capsule (50,000 Units total) by mouth daily.         Signed: Georgiann Cocker 06/15/2015, 7:49 AM

## 2015-06-24 ENCOUNTER — Emergency Department (HOSPITAL_COMMUNITY): Payer: Medicare Other

## 2015-06-24 ENCOUNTER — Observation Stay (HOSPITAL_COMMUNITY)
Admission: EM | Admit: 2015-06-24 | Discharge: 2015-06-27 | Disposition: A | Discharge status: Home / Self Care | Payer: Medicare Other | Attending: Internal Medicine | Admitting: Internal Medicine

## 2015-06-24 ENCOUNTER — Encounter (HOSPITAL_COMMUNITY): Payer: Self-pay | Admitting: *Deleted

## 2015-06-24 ENCOUNTER — Observation Stay (HOSPITAL_COMMUNITY): Payer: Medicare Other

## 2015-06-24 DIAGNOSIS — E785 Hyperlipidemia, unspecified: Secondary | ICD-10-CM | POA: Diagnosis not present

## 2015-06-24 DIAGNOSIS — G2581 Restless legs syndrome: Secondary | ICD-10-CM | POA: Insufficient documentation

## 2015-06-24 DIAGNOSIS — E860 Dehydration: Secondary | ICD-10-CM | POA: Insufficient documentation

## 2015-06-24 DIAGNOSIS — M797 Fibromyalgia: Secondary | ICD-10-CM | POA: Diagnosis not present

## 2015-06-24 DIAGNOSIS — E559 Vitamin D deficiency, unspecified: Secondary | ICD-10-CM | POA: Diagnosis not present

## 2015-06-24 DIAGNOSIS — I2510 Atherosclerotic heart disease of native coronary artery without angina pectoris: Secondary | ICD-10-CM | POA: Diagnosis present

## 2015-06-24 DIAGNOSIS — I10 Essential (primary) hypertension: Secondary | ICD-10-CM | POA: Diagnosis present

## 2015-06-24 DIAGNOSIS — Z8673 Personal history of transient ischemic attack (TIA), and cerebral infarction without residual deficits: Secondary | ICD-10-CM | POA: Diagnosis not present

## 2015-06-24 DIAGNOSIS — R0902 Hypoxemia: Secondary | ICD-10-CM

## 2015-06-24 DIAGNOSIS — Z9981 Dependence on supplemental oxygen: Secondary | ICD-10-CM | POA: Insufficient documentation

## 2015-06-24 DIAGNOSIS — K219 Gastro-esophageal reflux disease without esophagitis: Secondary | ICD-10-CM | POA: Insufficient documentation

## 2015-06-24 DIAGNOSIS — E1169 Type 2 diabetes mellitus with other specified complication: Secondary | ICD-10-CM

## 2015-06-24 DIAGNOSIS — Z7982 Long term (current) use of aspirin: Secondary | ICD-10-CM | POA: Insufficient documentation

## 2015-06-24 DIAGNOSIS — R52 Pain, unspecified: Secondary | ICD-10-CM | POA: Diagnosis present

## 2015-06-24 DIAGNOSIS — G4733 Obstructive sleep apnea (adult) (pediatric): Secondary | ICD-10-CM | POA: Diagnosis not present

## 2015-06-24 DIAGNOSIS — M1990 Unspecified osteoarthritis, unspecified site: Secondary | ICD-10-CM | POA: Diagnosis not present

## 2015-06-24 DIAGNOSIS — E039 Hypothyroidism, unspecified: Secondary | ICD-10-CM | POA: Diagnosis present

## 2015-06-24 DIAGNOSIS — R5383 Other fatigue: Secondary | ICD-10-CM | POA: Diagnosis present

## 2015-06-24 DIAGNOSIS — M4802 Spinal stenosis, cervical region: Secondary | ICD-10-CM | POA: Diagnosis not present

## 2015-06-24 DIAGNOSIS — E119 Type 2 diabetes mellitus without complications: Secondary | ICD-10-CM

## 2015-06-24 DIAGNOSIS — R51 Headache: Secondary | ICD-10-CM | POA: Diagnosis not present

## 2015-06-24 DIAGNOSIS — F329 Major depressive disorder, single episode, unspecified: Secondary | ICD-10-CM | POA: Insufficient documentation

## 2015-06-24 DIAGNOSIS — Z9660 Presence of unspecified orthopedic joint implant: Secondary | ICD-10-CM | POA: Diagnosis not present

## 2015-06-24 DIAGNOSIS — N179 Acute kidney failure, unspecified: Secondary | ICD-10-CM

## 2015-06-24 DIAGNOSIS — F3341 Major depressive disorder, recurrent, in partial remission: Secondary | ICD-10-CM | POA: Diagnosis present

## 2015-06-24 DIAGNOSIS — M62838 Other muscle spasm: Secondary | ICD-10-CM

## 2015-06-24 DIAGNOSIS — Z79899 Other long term (current) drug therapy: Secondary | ICD-10-CM | POA: Insufficient documentation

## 2015-06-24 DIAGNOSIS — G8918 Other acute postprocedural pain: Secondary | ICD-10-CM | POA: Diagnosis not present

## 2015-06-24 LAB — CBC WITH DIFFERENTIAL/PLATELET
Basophils Absolute: 0.1 10*3/uL (ref 0.0–0.1)
Basophils Relative: 0 %
Eosinophils Absolute: 0.2 10*3/uL (ref 0.0–0.7)
Eosinophils Relative: 2 %
HCT: 42 % (ref 36.0–46.0)
Hemoglobin: 13.6 g/dL (ref 12.0–15.0)
Lymphocytes Relative: 8 %
Lymphs Abs: 1 10*3/uL (ref 0.7–4.0)
MCH: 29.7 pg (ref 26.0–34.0)
MCHC: 32.4 g/dL (ref 30.0–36.0)
MCV: 91.7 fL (ref 78.0–100.0)
Monocytes Absolute: 0.9 10*3/uL (ref 0.1–1.0)
Monocytes Relative: 7 %
Neutro Abs: 10.7 10*3/uL — ABNORMAL HIGH (ref 1.7–7.7)
Neutrophils Relative %: 83 %
Platelets: 275 10*3/uL (ref 150–400)
RBC: 4.58 MIL/uL (ref 3.87–5.11)
RDW: 13.6 % (ref 11.5–15.5)
WBC: 12.8 10*3/uL — ABNORMAL HIGH (ref 4.0–10.5)

## 2015-06-24 LAB — PROTIME-INR
INR: 1.09 (ref 0.00–1.49)
Prothrombin Time: 14.3 seconds (ref 11.6–15.2)

## 2015-06-24 LAB — CBG MONITORING, ED
Glucose-Capillary: 112 mg/dL — ABNORMAL HIGH (ref 65–99)
Glucose-Capillary: 104 mg/dL — ABNORMAL HIGH (ref 65–99)

## 2015-06-24 LAB — BASIC METABOLIC PANEL
Anion gap: 8 (ref 5–15)
BUN: 37 mg/dL — ABNORMAL HIGH (ref 6–20)
CO2: 30 mmol/L (ref 22–32)
Calcium: 9.1 mg/dL (ref 8.9–10.3)
Chloride: 97 mmol/L — ABNORMAL LOW (ref 101–111)
Creatinine, Ser: 1.89 mg/dL — ABNORMAL HIGH (ref 0.44–1.00)
GFR calc Af Amer: 31 mL/min — ABNORMAL LOW (ref >60)
GFR calc non Af Amer: 27 mL/min — ABNORMAL LOW (ref >60)
Glucose, Bld: 117 mg/dL — ABNORMAL HIGH (ref 65–99)
Potassium: 3.8 mmol/L (ref 3.5–5.1)
Sodium: 135 mmol/L (ref 135–145)

## 2015-06-24 LAB — TSH: TSH: 1.974 u[IU]/mL (ref 0.350–4.500)

## 2015-06-24 LAB — MAGNESIUM: Magnesium: 1.8 mg/dL (ref 1.7–2.4)

## 2015-06-24 LAB — I-STAT CG4 LACTIC ACID, ED: Lactic Acid, Venous: 1.02 mmol/L (ref 0.5–2.0)

## 2015-06-24 LAB — BRAIN NATRIURETIC PEPTIDE: B Natriuretic Peptide: 31.8 pg/mL (ref 0.0–100.0)

## 2015-06-24 MED ORDER — LEVOTHYROXINE SODIUM 100 MCG PO TABS
100.0000 ug | ORAL_TABLET | Freq: Every day | ORAL | Status: DC
  Administered 2015-06-25 – 2015-06-27 (×3): 100 ug via ORAL
  Filled 2015-06-24 (×5): qty 1

## 2015-06-24 MED ORDER — GABAPENTIN 400 MG PO CAPS
800.0000 mg | ORAL_CAPSULE | Freq: Four times a day (QID) | ORAL | Status: DC
  Administered 2015-06-25: 800 mg via ORAL
  Filled 2015-06-24 (×4): qty 2

## 2015-06-24 MED ORDER — HEPARIN SODIUM (PORCINE) 5000 UNIT/ML IJ SOLN
5000.0000 [IU] | Freq: Three times a day (TID) | INTRAMUSCULAR | Status: DC
  Administered 2015-06-25 – 2015-06-27 (×8): 5000 [IU] via SUBCUTANEOUS
  Filled 2015-06-24 (×11): qty 1

## 2015-06-24 MED ORDER — MAGNESIUM CITRATE PO SOLN: 1.0000 | Freq: Once | ORAL | Status: DC | PRN

## 2015-06-24 MED ORDER — ONDANSETRON HCL 4 MG PO TABS: 4.0000 mg | ORAL_TABLET | Freq: Four times a day (QID) | ORAL | Status: DC | PRN

## 2015-06-24 MED ORDER — DEXAMETHASONE SODIUM PHOSPHATE 4 MG/ML IJ SOLN
4.0000 mg | Freq: Four times a day (QID) | INTRAMUSCULAR | Status: DC
  Administered 2015-06-25 (×2): 4 mg via INTRAVENOUS
  Filled 2015-06-24 (×4): qty 1

## 2015-06-24 MED ORDER — ONDANSETRON HCL 4 MG/2ML IJ SOLN: 4.0000 mg | Freq: Four times a day (QID) | INTRAMUSCULAR | Status: DC | PRN

## 2015-06-24 MED ORDER — ASPIRIN EC 81 MG PO TBEC
81.0000 mg | DELAYED_RELEASE_TABLET | Freq: Every day | ORAL | Status: DC
  Administered 2015-06-25 – 2015-06-27 (×4): 81 mg via ORAL
  Filled 2015-06-24 (×4): qty 1

## 2015-06-24 MED ORDER — POLYETHYLENE GLYCOL 3350 17 G PO PACK
17.0000 g | PACK | Freq: Every day | ORAL | Status: DC | PRN
  Filled 2015-06-24: qty 1

## 2015-06-24 MED ORDER — SODIUM CHLORIDE 0.9 % IV BOLUS (SEPSIS)
1000.0000 mL | Freq: Once | INTRAVENOUS | Status: AC
  Administered 2015-06-24: 1000 mL via INTRAVENOUS

## 2015-06-24 MED ORDER — HYDROMORPHONE HCL 1 MG/ML IJ SOLN
1.0000 mg | Freq: Once | INTRAMUSCULAR | Status: AC
  Administered 2015-06-24: 1 mg via INTRAVENOUS
  Filled 2015-06-24: qty 1

## 2015-06-24 MED ORDER — SORBITOL 70 % SOLN
30.0000 mL | Freq: Every day | Status: DC | PRN
  Filled 2015-06-24: qty 30

## 2015-06-24 MED ORDER — ACETAMINOPHEN 650 MG RE SUPP: 650.0000 mg | Freq: Four times a day (QID) | RECTAL | Status: DC | PRN

## 2015-06-24 MED ORDER — POTASSIUM CHLORIDE IN NACL 20-0.9 MEQ/L-% IV SOLN
INTRAVENOUS | Status: DC
  Administered 2015-06-25 – 2015-06-26 (×2): via INTRAVENOUS
  Filled 2015-06-24 (×5): qty 1000

## 2015-06-24 MED ORDER — DOCUSATE SODIUM 100 MG PO CAPS
100.0000 mg | ORAL_CAPSULE | Freq: Two times a day (BID) | ORAL | Status: DC
  Administered 2015-06-25 – 2015-06-27 (×6): 100 mg via ORAL
  Filled 2015-06-24 (×8): qty 1

## 2015-06-24 MED ORDER — HYDROMORPHONE HCL 1 MG/ML IJ SOLN: 1.0000 mg | INTRAMUSCULAR | Status: DC | PRN

## 2015-06-24 MED ORDER — DEXAMETHASONE SODIUM PHOSPHATE 10 MG/ML IJ SOLN
10.0000 mg | Freq: Once | INTRAMUSCULAR | Status: AC
  Administered 2015-06-24: 10 mg via INTRAVENOUS
  Filled 2015-06-24: qty 1

## 2015-06-24 MED ORDER — DULOXETINE HCL 60 MG PO CPEP
60.0000 mg | ORAL_CAPSULE | Freq: Every day | ORAL | Status: DC
Start: 2015-06-24 — End: 2015-06-27
  Administered 2015-06-25 – 2015-06-27 (×4): 60 mg via ORAL
  Filled 2015-06-24 (×5): qty 1

## 2015-06-24 MED ORDER — SODIUM CHLORIDE 0.9 % IJ SOLN
3.0000 mL | Freq: Two times a day (BID) | INTRAMUSCULAR | Status: DC
  Administered 2015-06-24 – 2015-06-26 (×4): 3 mL via INTRAVENOUS

## 2015-06-24 MED ORDER — LUBIPROSTONE 24 MCG PO CAPS
24.0000 ug | ORAL_CAPSULE | Freq: Two times a day (BID) | ORAL | Status: DC
  Administered 2015-06-25 – 2015-06-27 (×5): 24 ug via ORAL
  Filled 2015-06-24 (×7): qty 1

## 2015-06-24 MED ORDER — CYCLOBENZAPRINE HCL 5 MG PO TABS
5.0000 mg | ORAL_TABLET | Freq: Three times a day (TID) | ORAL | Status: DC
  Administered 2015-06-25: 5 mg via ORAL
  Filled 2015-06-24 (×3): qty 1

## 2015-06-24 MED ORDER — HYDROCODONE-ACETAMINOPHEN 5-325 MG PO TABS: 1.0000 | ORAL_TABLET | ORAL | Status: DC | PRN

## 2015-06-24 MED ORDER — NALOXONE HCL 0.4 MG/ML IJ SOLN
0.4000 mg | INTRAMUSCULAR | Status: DC | PRN
Start: 2015-06-24 — End: 2015-06-27

## 2015-06-24 MED ORDER — INSULIN ASPART 100 UNIT/ML ~~LOC~~ SOLN
0.0000 [IU] | Freq: Three times a day (TID) | SUBCUTANEOUS | Status: DC
  Administered 2015-06-25 – 2015-06-27 (×5): 2 [IU] via SUBCUTANEOUS

## 2015-06-24 MED ORDER — ACETAMINOPHEN 325 MG PO TABS: 650.0000 mg | ORAL_TABLET | Freq: Four times a day (QID) | ORAL | Status: DC | PRN

## 2015-06-24 MED ORDER — BUPROPION HCL ER (XL) 300 MG PO TB24
300.0000 mg | ORAL_TABLET | Freq: Every day | ORAL | Status: DC
  Administered 2015-06-25 – 2015-06-27 (×4): 300 mg via ORAL
  Filled 2015-06-24 (×5): qty 1

## 2015-06-24 MED ORDER — PANTOPRAZOLE SODIUM 40 MG PO TBEC
40.0000 mg | DELAYED_RELEASE_TABLET | Freq: Every day | ORAL | Status: DC
  Administered 2015-06-25 – 2015-06-27 (×4): 40 mg via ORAL
  Filled 2015-06-24 (×4): qty 1

## 2015-06-24 NOTE — ED Notes (Signed)
Cancel repeat istat lactic acid

## 2015-06-24 NOTE — ED Provider Notes (Signed)
CSN: [id number]     Arrival date & time 06/24/15  1552 History   First MD Initiated Contact with Patient 06/24/15 1557     Chief Complaint  Patient presents with  . Back Pain     (Consider location/radiation/quality/duration/timing/severity/associated sxs/prior Treatment) HPI Comments: Patient presents to the emergency department with chief complaint of fatigue. She states that she recently had neck surgery on December 14. She has had difficulty controlling her pain. Today she states that her husband was having difficulty feeding her, and that she was having a difficult time staying awake. She does not complain of pain at this time. She is on 2 L of oxygen at home, but requiring 5 L function in the ED. She reports having a slight cough, but denies any fevers, chills, abdominal pain, or dysuria. She denies any other symptoms.  The history is provided by the patient. No language interpreter was used.    Past Medical History  Diagnosis Date  . Restless leg syndrome   . Fibromyalgia   . Rhinitis 06/25/2010  . Gait disorder 10/26/2012  . Hyperlipidemia   . Hypothyroid   . Vitamin D deficiency   . ASCVD (arteriosclerotic cardiovascular disease)   . Hypertension   . TIA (transient ischemic attack)     3        . Arthritis   . GERD (gastroesophageal reflux disease)   . GI bleed   . Sleep apnea   . Depression    Past Surgical History  Procedure Laterality Date  . Knee susrgery    . Shoulder adhesion release    . Spine surgery    . Appendectomy  1960  . Cesarean section  1983 & 1886  . Abdominal hysterectomy  1992  . Rotator cuff repair Left 2005    2 LEFT  1  RIGHT  . Tonsillectomy      T+A  . Tonsillectomy  1960  . Joint replacement      2   LEFT  1 RIGHT   . Toe surgery      LEFT       . Toe surgery      RIGHT  BIG TOE  . Anterior cervical decompression/discectomy fusion 4 levels N/A 06/14/2015    Procedure: Cervical three-four Cervical four-five Cervical five- six  Cervical six- seven  Anterior cervical decompression/diskectomy/fusion;  Surgeon: Maeola Harman, MD;  Location: [facility];  Service: Neurosurgery;  Laterality: N/A;  C3-4 C4-5 C5-6 C6-7 Anterior cervical decompression/diskectomy/fusion   Family History  Problem Relation Age of Onset  . Cancer Sister     uterus  . Depression Sister   . Heart disease Father   . Hypertension Father   . Hyperlipidemia Father   . Cancer Mother   . Diabetes Mother   . Osteoporosis Mother   . Leukemia Mother   . Leukemia Sister   . Lupus Sister    Social History  Substance Use Topics  . Smoking status: Never Smoker   . Smokeless tobacco: Not on file  . Alcohol Use: 0.0 oz/week    0 Standard drinks or equivalent per week     Comment: rarely   OB History    No data available     Review of Systems  Constitutional: Negative for fever and chills.  Respiratory: Negative for shortness of breath.   Cardiovascular: Negative for chest pain.  Gastrointestinal: Negative for nausea, vomiting, diarrhea and constipation.  Genitourinary: Negative for dysuria.  All other systems reviewed and are  negative.     Allergies  Atorvastatin; Codeine; Pravastatin; Statins; Amitriptyline; Fish oil; Linzess; Nuvigil; Erythromycin; Erythromycin base; Flax seed; Hydrocodone-acetaminophen; Morphine; Nsaids; Sertraline; Sinemet; and Sulfamethoxazole  Home Medications   Prior to Admission medications   Medication Sig Start Date End Date Taking? Authorizing Provider  acyclovir (ZOVIRAX) 800 MG tablet take 1 tablet by mouth 3 times daily before meals and 2 at bedtime 02/12/14   [name], MD  aspirin 81 MG tablet Take 81 mg by mouth daily.    Historical Provider, MD  baclofen (LIORESAL) 20 MG tablet Take 1 tablet 2 x daily at 7 AM and 2 PM for muscle spasm 01/31/15 08/03/15  [name], MD  bisoprolol-hydrochlorothiazide [facility]) 10-6.25 MG tablet TAKE ONE TABLET BY MOUTH ONE TIME DAILY FOR BLOOD PRESSURE 06/12/15    Courtney Forcucci, PA-C  buprenorphine (BUTRANS - DOSED MCG/HR) 10 MCG/HR PTWK patch Place 10 mcg onto the skin once a week.    Historical Provider, MD  buPROPion (WELLBUTRIN XL) 300 MG 24 hr tablet Take 300 mg by mouth daily.    Historical Provider, MD  cetirizine (ZYRTEC) 10 MG tablet TAKE 1 TABLET (10 MG TOTAL) BY MOUTH AT BEDTIME. Patient taking differently: TAKE 1 TABLET (10 MG TOTAL) BY MOUTH AT BEDTIME AS NEEDED 05/12/15   [name], MD  cyclobenzaprine (FLEXERIL) 10 MG tablet TAKE 1 TABLET BY MOUTH EVERY 8HRS AS NEEDED FOR MUSCLE SPASMS 06/12/15   Courtney Forcucci, PA-C  dexlansoprazole (DEXILANT) 60 MG capsule Take 1 capsule (60 mg total) by mouth daily. 01/31/15   [name], MD  doxycycline (VIBRAMYCIN) 100 MG capsule TAKE 2 CAPS NOW ON A FULL STOMACH, THEN TAKE 1 CAPSULE 2 TIMES DAILY ON A FULL STOMACH FOR INFECTION 06/12/15   Courtney Forcucci, PA-C  DULoxetine (CYMBALTA) 60 MG capsule Take 1 capsule (60 mg total) by mouth daily. 05/09/15   Quentin Mulling, PA-C  DULoxetine (CYMBALTA) 60 MG capsule TAKE 1 CAPSULE BY MOUTH DAILY. 06/12/15   Courtney Forcucci, PA-C  fenofibrate micronized (LOFIBRA) 134 MG capsule TAKE 1 CAPSULE BY MOUTH DAILY BEFORE BREAKFAST 01/31/15   [name], MD  fluticasone [facility]) 50 MCG/ACT nasal spray USE TWO SPRAYS IN EACH NOSTRIL DAILY AS NEEDED 10/06/14   [name], MD  gabapentin (NEURONTIN) 800 MG tablet TAKE ONE TABLET BY MOUTH FOUR TIMES DAILY 12/30/14   [name], MD  levothyroxine (SYNTHROID, LEVOTHROID) 100 MCG tablet TAKE 1 TABLET BY MOUTH ON TUES, WED, THURS, SAT AND SUN. TAKE 1& 1/2 TABLET ON MON AND FRI 06/12/15   Courtney Forcucci, PA-C  losartan (COZAAR) 50 MG tablet Take 1 tablet (50 mg total) by mouth daily. 01/31/15   [name], MD  lubiprostone (AMITIZA) 24 MCG capsule Take 24 mcg by mouth 2 (two) times daily with a meal.    Historical Provider, MD  neomycin-polymyxin-hydrocortisone (CORTISPORIN) otic solution 6  - 8  drops to affected ear 4 x daily for Otitis Externa 01/02/15   [name], MD  OVER THE COUNTER MEDICATION Take 1 tablet by mouth daily. OSTEO MATRIX    Historical Provider, MD  OVER THE COUNTER MEDICATION ALEO VERA JUICE - DRINK ONCE DAILY IN THE MORNING    Historical Provider, MD  oxybutynin (DITROPAN) 5 MG tablet take 1 tablet two times a day 05/08/15   Quentin Mulling, PA-C  pilocarpine (SALAGEN) 5 MG tablet Take 1 tablet (5 mg total) by mouth 2 (two) times daily. Patient taking differently: Take 5 mg by mouth 2 (two) times daily as needed.  03/15/15   [name], MD  Probiotic Product (PROBIOTIC ADVANCED PO) Take 1 capsule by mouth daily. ULTIMATE PROBIOTIC    Historical Provider, MD  prochlorperazine (COMPAZINE) 5 MG tablet Take 1 tablet (5 mg total) by mouth every 6 (six) hours as needed for nausea or vomiting. 09/05/14 09/05/15  Courtney Forcucci, PA-C  ranitidine (ZANTAC) 150 MG tablet Take 150 mg by mouth at bedtime.    Historical Provider, MD  rOPINIRole (REQUIP) 4 MG tablet TAKE 1 TABLET BY MOUTH AT BEDTIME FOR RESTLESS LEG 06/04/15   Quentin Mulling, PA-C  TRAMADOL HCL PO Take by mouth as needed.    Historical Provider, MD  Vitamin D, Ergocalciferol, (DRISDOL) 50000 UNITS CAPS capsule Take 1 capsule (50,000 Units total) by mouth daily. Patient taking differently: Take 50,000 Units by mouth 2 (two) times a week. MON & FRI 05/10/15   Quentin Mulling, PA-C   BP 87/52 mmHg  Pulse 93  Temp(Src) 98 F (36.7 C) (Oral)  Resp 15  SpO2 94% Physical Exam  Constitutional: She is oriented to person, place, and time. She appears well-developed and well-nourished.  In [place] collar  HENT:  Head: Normocephalic and atraumatic.  Eyes: Conjunctivae and EOM are normal. Pupils are equal, round, and reactive to light.  Neck: Normal range of motion. Neck supple.  Cardiovascular: Normal rate and regular rhythm.  Exam reveals no gallop and no friction rub.   No murmur heard. Pulmonary/Chest: Effort  normal and breath sounds normal. No respiratory distress. She has no wheezes. She has no rales. She exhibits no tenderness.  Abdominal: Soft. Bowel sounds are normal. She exhibits no distension and no mass. There is no tenderness. There is no rebound and no guarding.  Musculoskeletal: Normal range of motion. She exhibits no edema or tenderness.  Neurological: She is alert and oriented to person, place, and time.  Drowsy, but arousable  Skin: Skin is warm and dry.  Surgical incision on anterior neck is without evidence of infection  Psychiatric: She has a normal mood and affect. Her behavior is normal. Judgment and thought content normal.  Nursing note and vitals reviewed.   ED Course  Procedures (including critical care time) Results for orders placed or performed during the hospital encounter of 06/24/15  CBC with Differential/Platelet  Result Value Ref Range   WBC 12.8 (H) 4.0 - 10.5 K/uL   RBC 4.58 3.87 - 5.11 MIL/uL   Hemoglobin 13.6 12.0 - 15.0 g/dL   HCT 16.1 09.6 - 04.5 %   MCV 91.7 78.0 - 100.0 fL   MCH 29.7 26.0 - 34.0 pg   MCHC 32.4 30.0 - 36.0 g/dL   RDW 40.9 81.1 - 91.4 %   Platelets 275 150 - 400 K/uL   Neutrophils Relative % 83 %   Neutro Abs 10.7 (H) 1.7 - 7.7 K/uL   Lymphocytes Relative 8 %   Lymphs Abs 1.0 0.7 - 4.0 K/uL   Monocytes Relative 7 %   Monocytes Absolute 0.9 0.1 - 1.0 K/uL   Eosinophils Relative 2 %   Eosinophils Absolute 0.2 0.0 - 0.7 K/uL   Basophils Relative 0 %   Basophils Absolute 0.1 0.0 - 0.1 K/uL  Basic metabolic panel  Result Value Ref Range   Sodium 135 135 - 145 mmol/L   Potassium 3.8 3.5 - 5.1 mmol/L   Chloride 97 (L) 101 - 111 mmol/L   CO2 30 22 - 32 mmol/L   Glucose, Bld 117 (H) 65 - 99 mg/dL   BUN 37 (  H) 6 - 20 mg/dL   Creatinine, Ser 9.93 (H) 0.44 - 1.00 mg/dL   Calcium 9.1 8.9 - 71.6 mg/dL   GFR calc non Af Amer 27 (L) >60 mL/min   GFR calc Af Amer 31 (L) >60 mL/min   Anion gap 8 5 - 15  I-Stat CG4 Lactic Acid, ED  Result  Value Ref Range   Lactic Acid, Venous 1.02 0.5 - 2.0 mmol/L  CBG monitoring, ED  Result Value Ref Range   Glucose-Capillary 112 (H) 65 - 99 mg/dL  CBG monitoring, ED  Result Value Ref Range   Glucose-Capillary 104 (H) 65 - 99 mg/dL   Dg Cervical Spine 2-3 Views  06/14/2015  CLINICAL DATA:  Cervical spine surgery. EXAM: CERVICAL SPINE - 2-3 VIEW COMPARISON:  MRI 06/07/2015. FINDINGS: Metallic marker noted initially at the C3-C4 level. C3-C4 and C4-C5 anterior fusion. Lower cervical spine not well imaged. IMPRESSION: Postsurgical changes cervical spine. Electronically Signed   By: Maisie Fus  Register   On: 06/14/2015 11:45   Ct Cervical Spine Wo Contrast  06/24/2015  CLINICAL DATA:  64 year old female with with recent cervical discectomy presenting with fall and neck pain. EXAM: CT CERVICAL SPINE WITHOUT CONTRAST TECHNIQUE: Multidetector CT imaging of the cervical spine was performed without intravenous contrast. Multiplanar CT image reconstructions were also generated. COMPARISON:  Radiograph dated 06/14/2015 FINDINGS: There is no acute fracture or subluxation of the cervical spine.There is postsurgical changes of C3-C7 disc spacers with anterior fusion plate and screws. The bones are osteopenic. There is multilevel degenerative changes of the spine. There is C2-C3 ankylosis. NewThe odontoid and spinous processes are intact.There is normal anatomic alignment of the C1-C2 lateral masses. The visualized soft tissues appear unremarkable. IMPRESSION: No acute fracture or subluxation. C3-C7 anterior fusion plate and screws. Electronically Signed   By: Elgie Collard M.D.   On: 06/24/2015 18:19    I have personally reviewed and evaluated these images and lab results as part of my medical decision-making.   MDM   Final diagnoses:  Post-op pain  AKI (acute kidney injury) (HCC)    Patient with fatigue, sleeping more than normal, having a difficult time staying awake to eat. I suspect that the  patient is overmedicated.  Patient is unable to tell me what she is taking or how much.  BP is soft and patient is hypoxic.  Will give some fluids and check labs.  Doubt infection.  Currently protecting airway and arousable though drowsy.  Will hold narcan for now.  Patient seen by and discussed with Dr. Donnald Garre, who recommends consulting with neurosurgery after ED workup is complete.  Patient will likely require admission for pain control  Patient's pain is unrelieved and she is having side effects of of being on the pain meds: hypoxic, drowsy, not eating or drinking.  Cr is up at 1.89.  Likely dehydration.  Dr. Donnald Garre agrees with plan for neurosurg consult and admission to medicine for dehydration and AKI.  6:47 PM Discussed with Dr. Gerlene Fee from neurosurgery.  Advises that infection of anterior cervical decompression is extremely rare.  Advises that patient be started on some decadron and muscle relaxer.  He is available for consult, but at this time does not see a need for any surgical intervention.  7:07 PM Appreciate Dr. Antionette Char for admitting the patient.        Roxy Horseman, PA-C 06/24/15 1907  Arby Barrette, MD 07/07/15 [phone]

## 2015-06-24 NOTE — ED Notes (Signed)
Delay in transport, pt getting xray

## 2015-06-24 NOTE — ED Notes (Signed)
Delayed in blood due to pt is using the bathroom

## 2015-06-24 NOTE — ED Notes (Signed)
Nurse drawing labs. 

## 2015-06-24 NOTE — ED Notes (Signed)
Pt from home via EMS- Per EMS, pt had cervical discectomy 12/14. Pt has been taking rx'd pain meds without relief. Pt is here today requesting pain control. Pt has had pain meds changed by her physician but none have worked for her. Pt is A&O and in NAD

## 2015-06-24 NOTE — H&P (Signed)
[facility] History and Physical  Crystal [name] HUD:[id number] DOB: [birth year] DOA: 06/24/2015  Referring physician: ED physician PCP: Crystal Richards, MD  Specialists: Dr. Vertell [name], [place]  Chief Complaint: Intractable pain, weakness  HPI: Crystal [name] is a 64 y.o. female with PMH of hypertension, hyperlipidemia, fibromyalgia, OSA, depression, and cervical spinal stenosis status post anterior cervical decompression with diskectomy and fusion on 06/14/2015, presenting to the ED for intractable neck pain despite aggressive attempts to manage at home. The surgery was performed by Dr. Vertell [name] with no immediate complications and the patient was discharged home the following day without incident. She was discharged with a cervical collar, Flexeril and Norco. She was unable to achieve adequate pain-control despite also using tramadol, baclofen, and PO Dilaudid. Reports that pain is continuous, focused at cervical spine, with intermittent radiation down the spine and down both arms. Pain is worse in seated position and a little better when lying supine. Her husband reports that she has not been able to tolerate eating or drinking due to pain, and has had only a half of cookie and several sips of water in the last 3 days. She denies fever, chills, cough, confusion, or focal weakness or numbness. She endorses intermittent right-sided frontal headaches.   In ED, patient was found to be afebrile, saturating well on room air, with vital signs stable. Blood work was notable for SCr of 1.89, up from baseline of <1. A leukocytosis to 12.8k was also noted. CT cervical spine was obtained and failed to demonstrate any acute fracture, subluxation, or explanation for patient's intractable pain.  On-call neurosurgeon, Dr. Hal [name] was contacted from the ED and recommended Decadron and muscle relaxants. He is available for formal consultation as needed. Hospitalists were asked to admit this patient.   Where does  patient live?   At home   Can patient participate in ADLs?  Barely        Review of Systems:   General: no fevers, chills, sweats, or weight change. Positive for poor appetite, fatigue HEENT: no blurry vision, hearing changes or sore throat Pulm: no dyspnea, cough, or wheeze CV: no chest pain or palpitations Abd: no nausea, vomiting, abdominal pain, or diarrhea. Constipation GU: no dysuria, hematuria, increased urinary frequency, or urgency  Ext: no leg edema Neuro: no focal weakness, no vision change or hearing loss. Numbness, tingling radiates from neck down both arms, and down spine when sitting up Skin: no rash, no wounds, aside from anterior cervical incision site MSK: No muscle spasm, no deformity, no red, hot, or swollen joint Heme: No easy bruising or bleeding Travel history: No recent long distant travel    Allergy:  Allergies  Allergen Reactions  . Atorvastatin Other (See Comments)    Extreme pain  . Codeine Other (See Comments)    Headache  . Pravastatin Other (See Comments)    Exreme pain  . Statins Other (See Comments)    Extreme pain  . Amitriptyline Other (See Comments)    Unspecified  . Fish Oil Nausea Only  . Linzess [Linaclotide] Other (See Comments)    Unspecified  . Nuvigil [Armodafinil] Other (See Comments)    Unspecified  . Erythromycin Other (See Comments)    Reaction unspecified  . Erythromycin Base Rash  . Flax Seed [Bio-Flax] Rash    Linseed  . Hydrocodone-Acetaminophen Rash  . Morphine Nausea And Vomiting and Rash  . Nsaids Nausea Only  . Sertraline Rash  . Sinemet [Carbidopa W-Levodopa] Rash  . Sulfamethoxazole Rash  Past Medical History  Diagnosis Date  . Restless leg syndrome   . Fibromyalgia   . Rhinitis 06/25/2010  . Gait disorder 10/26/2012  . Hyperlipidemia   . Hypothyroid   . Vitamin D deficiency   . ASCVD (arteriosclerotic cardiovascular disease)   . Hypertension   . TIA (transient ischemic attack)     3        .  Arthritis   . GERD (gastroesophageal reflux disease)   . GI bleed   . Sleep apnea   . Depression     Past Surgical History  Procedure Laterality Date  . Knee susrgery    . Shoulder adhesion release    . Spine surgery    . Appendectomy  1960  . Cesarean section  [place]  . Abdominal hysterectomy  1992  . Rotator cuff repair Left 2005    2 LEFT  1  RIGHT  . Tonsillectomy      T+A  . Tonsillectomy  1960  . Joint replacement      2   LEFT  1 RIGHT   . Toe surgery      LEFT       . Toe surgery      RIGHT  BIG TOE  . Anterior cervical decompression/discectomy fusion 4 levels N/A 06/14/2015    Procedure: Cervical three-four Cervical four-five Cervical five- six Cervical six- seven  Anterior cervical decompression/diskectomy/fusion;  Surgeon: Erline Levine, MD;  Location: [facility];  Service: Neurosurgery;  Laterality: N/A;  C3-4 C4-5 C5-6 C6-7 Anterior cervical decompression/diskectomy/fusion    Social History:  reports that she has never smoked. She does not have any smokeless tobacco history on file. She reports that she drinks alcohol. She reports that she does not use illicit drugs.  Family History:  Family History  Problem Relation Age of Onset  . Cancer Sister     uterus  . Depression Sister   . Heart disease Father   . Hypertension Father   . Hyperlipidemia Father   . Cancer Mother   . Diabetes Mother   . Osteoporosis Mother   . Leukemia Mother   . Leukemia Sister   . Lupus Sister      Prior to Admission medications   Medication Sig Start Date End Date Taking? Authorizing Provider  aspirin EC 81 MG tablet Take 81 mg by mouth daily.   Yes Historical Provider, MD  bisoprolol-hydrochlorothiazide (ZIAC) 10-6.25 MG tablet TAKE ONE TABLET BY MOUTH ONE TIME DAILY FOR BLOOD PRESSURE 06/12/15  Yes Courtney Forcucci, PA-C  buprenorphine (BUTRANS - DOSED MCG/HR) 10 MCG/HR PTWK patch Place 10 mcg onto the skin once a week. wednesday   Yes Historical Provider, MD   buPROPion (WELLBUTRIN XL) 300 MG 24 hr tablet Take 300 mg by mouth daily.   Yes Historical Provider, MD  cyclobenzaprine (FLEXERIL) 10 MG tablet TAKE 1 TABLET BY MOUTH EVERY 8HRS AS NEEDED FOR MUSCLE SPASMS 06/12/15  Yes Courtney Forcucci, PA-C  dexlansoprazole (DEXILANT) 60 MG capsule Take 1 capsule (60 mg total) by mouth daily. 01/31/15  Yes Unk Pinto, MD  diazepam (VALIUM) 5 MG tablet Take 5-10 mg by mouth 3 (three) times daily as needed for muscle spasms.   Yes Historical Provider, MD  doxycycline (VIBRAMYCIN) 100 MG capsule TAKE 2 CAPS NOW ON A FULL STOMACH, THEN TAKE 1 CAPSULE 2 TIMES DAILY ON A FULL STOMACH FOR INFECTION 06/12/15  Yes Courtney Forcucci, PA-C  DULoxetine (CYMBALTA) 60 MG capsule TAKE 1 CAPSULE BY MOUTH  DAILY. 06/12/15  Yes Courtney Forcucci, PA-C  fenofibrate micronized (LOFIBRA) 134 MG capsule TAKE 1 CAPSULE BY MOUTH DAILY BEFORE BREAKFAST 01/31/15  Yes Unk Pinto, MD  gabapentin (NEURONTIN) 800 MG tablet TAKE ONE TABLET BY MOUTH FOUR TIMES DAILY 12/30/14  Yes Unk Pinto, MD  HYDROmorphone (DILAUDID) 2 MG tablet Take 2-4 mg by mouth every 4 (four) hours as needed for severe pain.   Yes Historical Provider, MD  levothyroxine (SYNTHROID, LEVOTHROID) 100 MCG tablet TAKE 1 TABLET BY MOUTH ON TUES, WED, THURS, SAT AND SUN. TAKE 1& 1/2 TABLET ON MON AND FRI 06/12/15  Yes Courtney Forcucci, PA-C  losartan (COZAAR) 50 MG tablet Take 1 tablet (50 mg total) by mouth daily. 01/31/15  Yes Unk Pinto, MD  lubiprostone (AMITIZA) 24 MCG capsule Take 24 mcg by mouth 2 (two) times daily with a meal.   Yes Historical Provider, MD  oxybutynin (DITROPAN) 5 MG tablet take 1 tablet two times a day 05/08/15  Yes Vicie Mutters, PA-C  ranitidine (ZANTAC) 150 MG tablet Take 150 mg by mouth at bedtime.   Yes Historical Provider, MD  rOPINIRole (REQUIP) 4 MG tablet TAKE 1 TABLET BY MOUTH AT BEDTIME FOR RESTLESS LEG Patient taking differently: take half of a tablet (0.5 mg) twice daily  06/04/15  Yes Vicie Mutters, PA-C  Vitamin D, Ergocalciferol, (DRISDOL) 50000 UNITS CAPS capsule Take 1 capsule (50,000 Units total) by mouth daily. Patient taking differently: Take 50,000 Units by mouth 2 (two) times a week. MON & FRI 05/10/15  Yes Vicie Mutters, PA-C  acyclovir (ZOVIRAX) 800 MG tablet take 1 tablet by mouth 3 times daily before meals and 2 at bedtime Patient not taking: Reported on 06/24/2015 02/12/14   Unk Pinto, MD  baclofen (LIORESAL) 20 MG tablet Take 1 tablet 2 x daily at 7 AM and 2 PM for muscle spasm Patient not taking: Reported on 06/24/2015 01/31/15 08/03/15  Unk Pinto, MD  cetirizine (ZYRTEC) 10 MG tablet TAKE 1 TABLET (10 MG TOTAL) BY MOUTH AT BEDTIME. Patient not taking: Reported on 06/24/2015 05/12/15   Unk Pinto, MD  fluticasone [facility]) 50 MCG/ACT nasal spray USE TWO SPRAYS IN [facility] NOSTRIL DAILY AS NEEDED Patient not taking: Reported on 06/24/2015 10/06/14   Unk Pinto, MD  neomycin-polymyxin-hydrocortisone (CORTISPORIN) otic solution 6  - 8 drops to affected ear 4 x daily for Otitis Externa Patient not taking: Reported on 06/24/2015 01/02/15   Unk Pinto, MD  pilocarpine (SALAGEN) 5 MG tablet Take 1 tablet (5 mg total) by mouth 2 (two) times daily. Patient not taking: Reported on 06/24/2015 03/15/15   Unk Pinto, MD  prochlorperazine (COMPAZINE) 5 MG tablet Take 1 tablet (5 mg total) by mouth every 6 (six) hours as needed for nausea or vomiting. Patient not taking: Reported on 06/24/2015 09/05/14 09/05/15  Starlyn Skeans, PA-C    Physical Exam: Filed Vitals:   06/24/15 1603 06/24/15 1645 06/24/15 1700 06/24/15 1800  BP: 87/52 95/65 101/60 100/73  Pulse: 93 79 87 82  Temp: 98 F (36.7 C)     TempSrc: Oral     Resp: 15 13  14   SpO2: 94% 96% 100% 94%   General: Not in acute respiratory distress. Laying supine with cervical collar on, calling out in pain  HEENT:       Eyes: PERRL, EOMI, no scleral icterus or conjunctival pallor.  Pupils 54m and reactive b/l       ENT: No discharge from the ears or nose, oral mucosa dry and tacky, lips  cracking.        Neck: No JVD, no bruit, no appreciable mass Heme: No supra- or infraclavicular adenopathy, no pallor Cardiac: S1/S2, RRR, No significant murmurs, No gallops or rubs. Pulm: Good air movement bilaterally. No rales, wheezing, rhonchi or rubs. Nasal canula in place Abd: Soft, mild distension, nontender, no rebound pain or gaurding, no mass or organomegaly, BS present. Ext: No LE edema bilaterally. 2+DP/PT pulse bilaterally. Musculoskeletal: No gross deformity, no red, hot, swollen joints, anterior cervical surgical incision is non-draining, without surrounding erythema  Skin: No rashes or wounds on exposed surfaces  Neuro: Alert, oriented X3, PERRL, EOMI, tongue and uvula midline, shouler shrug intact, muscle strength 5/5 and symmetric in grip, elbow flexion/extension, shoulder flexion/abduction, hip flexion, knee extension, plantar flexion, dorsiflexion.  Sensation to light touch intact in distal extrems x4. Brachial reflex 2+ bilaterally. Knee reflex 2+ bilaterally. Negative Babinski's sign.  No focal findings Psych: Patient is not overtly psychotic, denies suicidal or homocidal ideation, no active hallucinations.  Labs on Admission:  Basic Metabolic Panel:  Recent Labs Lab 06/24/15 1647  NA 135  K 3.8  CL 97*  CO2 30  GLUCOSE 117*  BUN 37*  CREATININE 1.89*  CALCIUM 9.1   Liver Function Tests: No results for input(s): AST, ALT, ALKPHOS, BILITOT, PROT, ALBUMIN in the last 168 hours. No results for input(s): LIPASE, AMYLASE in the last 168 hours. No results for input(s): AMMONIA in the last 168 hours. CBC:  Recent Labs Lab 06/24/15 1647  WBC 12.8*  NEUTROABS 10.7*  HGB 13.6  HCT 42.0  MCV 91.7  PLT 275   Cardiac Enzymes: No results for input(s): CKTOTAL, CKMB, CKMBINDEX, TROPONINI in the last 168 hours.  BNP (last 3 results) No results for input(s):  BNP in the last 8760 hours.  ProBNP (last 3 results) No results for input(s): PROBNP in the last 8760 hours.  CBG:  Recent Labs Lab 06/24/15 1621 06/24/15 1632  GLUCAP 112* 104*    Radiological Exams on Admission: Ct Cervical Spine Wo Contrast  06/24/2015  CLINICAL DATA:  64 year old female with with recent cervical discectomy presenting with fall and neck pain. EXAM: CT CERVICAL SPINE WITHOUT CONTRAST TECHNIQUE: Multidetector CT imaging of the cervical spine was performed without intravenous contrast. Multiplanar CT image reconstructions were also generated. COMPARISON:  Radiograph dated 06/14/2015 FINDINGS: There is no acute fracture or subluxation of the cervical spine.There is postsurgical changes of C3-C7 disc spacers with anterior fusion plate and screws. The bones are osteopenic. There is multilevel degenerative changes of the spine. There is C2-C3 ankylosis. NewThe odontoid and spinous processes are intact.There is normal anatomic alignment of the C1-C2 lateral masses. The visualized soft tissues appear unremarkable. IMPRESSION: No acute fracture or subluxation. C3-C7 anterior fusion plate and screws. Electronically Signed   By: Anner Crete M.D.   On: 06/24/2015 18:19    EKG: Not done in ED, ordered and pending   Assessment/Plan  1. Post-operative pain, intractable  - Anterior approach decompression/diskectomy/fusion of C3-7 performed 06/14/15 by Dr. Vertell [name] without incident  - Failed management with Norco, Advil, tramadol, hydromorphone, buprenorphine, baclofen, Flexeril, and Valium at home - CT cervical spine stable with no acute fracture or subluxation  - Admit to obs for pain-control  - NSAIDs ideal, but avoiding now given ARF  - APAP, Norco, and Dilaudid prn now, consider long-acting agent as appropriate at d/c  - Continue Baclofen  - Augment with Neurontin and Cymbalta (her home meds), and Decadron 85m q6h   -  Aggressive bowel regimen - Monitor with continuous pulse  ox and end-tidal CO2; naloxone available prn overdose   - Anticipate need for adjusting the regimen to achieve pain-control  2. Acute renal failure, dehydration - SCr 1.89, up from baseline of <1  - Suspect secondary to very poor oral intake, dehydration - 2 L NS bolus given in ED, continue with NS + K at 100cc/hr overnight  - Anticipate recovery with IVF resuscitation, follow BMET  - Avoiding NSAIDs for now, may benefit from ketorolac or others once ARF resolved and pt taking taking adequate PO   3. Chronic HTN - Low BP here prior to IVF  - Holding home antihypertensives for now - Cautious resumption of home meds as appropriate   4. Depression - Continue home Cymbalta  - May be contributing to #1 above  - Denies SI/HI, hallucinations - Monitoring  5. Hypothyroidism - Continue home Synthroid  - No suggestion of acute thyroid disorder currently   6. Non insulin-dependent DM - Glucose slightly elevated at admit  - Not on home anti-diabetic agents  - CBGs TID w/ meals and qHS with [facility], while on Decadron-  - A1c pending   7. OSA  - CPAP qHS  - on continuous pulse ox and ETCO2 as above    DVT ppx: SQ Heparin     Code Status: Full code Family Communication: Yes, patient's husband at bed side Disposition Plan: Admit to inpatient   Date of Service 06/24/2015    Vianne Bulls, MD [facility] Pager [phone]  If 7PM-7AM, please contact night-coverage [email or web address] Password [facility] 06/24/2015, 7:52 PM

## 2015-06-24 NOTE — ED Notes (Signed)
Bed: WA20 Expected date:  Expected time:  Means of arrival:  Comments: EMS- 64 yo post surgical back pain

## 2015-06-25 ENCOUNTER — Encounter: Payer: Self-pay | Admitting: *Deleted

## 2015-06-25 DIAGNOSIS — N179 Acute kidney failure, unspecified: Secondary | ICD-10-CM | POA: Diagnosis not present

## 2015-06-25 DIAGNOSIS — I10 Essential (primary) hypertension: Secondary | ICD-10-CM | POA: Diagnosis not present

## 2015-06-25 DIAGNOSIS — G8918 Other acute postprocedural pain: Secondary | ICD-10-CM | POA: Diagnosis not present

## 2015-06-25 DIAGNOSIS — E119 Type 2 diabetes mellitus without complications: Secondary | ICD-10-CM | POA: Diagnosis not present

## 2015-06-25 LAB — CBC
HCT: 40.2 % (ref 36.0–46.0)
Hemoglobin: 13.3 g/dL (ref 12.0–15.0)
MCH: 30 pg (ref 26.0–34.0)
MCHC: 33.1 g/dL (ref 30.0–36.0)
MCV: 90.7 fL (ref 78.0–100.0)
Platelets: 250 10*3/uL (ref 150–400)
RBC: 4.43 MIL/uL (ref 3.87–5.11)
RDW: 13.5 % (ref 11.5–15.5)
WBC: 10.6 10*3/uL — ABNORMAL HIGH (ref 4.0–10.5)

## 2015-06-25 LAB — BASIC METABOLIC PANEL
Anion gap: 10 (ref 5–15)
BUN: 25 mg/dL — ABNORMAL HIGH (ref 6–20)
CO2: 25 mmol/L (ref 22–32)
Calcium: 8.8 mg/dL — ABNORMAL LOW (ref 8.9–10.3)
Chloride: 103 mmol/L (ref 101–111)
Creatinine, Ser: 0.92 mg/dL (ref 0.44–1.00)
GFR calc Af Amer: >60 mL/min (ref >60)
GFR calc non Af Amer: >60 mL/min (ref >60)
Glucose, Bld: 150 mg/dL — ABNORMAL HIGH (ref 65–99)
Potassium: 4.8 mmol/L (ref 3.5–5.1)
Sodium: 138 mmol/L (ref 135–145)

## 2015-06-25 LAB — GLUCOSE, CAPILLARY
Glucose-Capillary: 127 mg/dL — ABNORMAL HIGH (ref 65–99)
Glucose-Capillary: 147 mg/dL — ABNORMAL HIGH (ref 65–99)
Glucose-Capillary: 128 mg/dL — ABNORMAL HIGH (ref 65–99)
Glucose-Capillary: 139 mg/dL — ABNORMAL HIGH (ref 65–99)

## 2015-06-25 LAB — URINALYSIS, ROUTINE W REFLEX MICROSCOPIC
Bilirubin Urine: NEGATIVE
Glucose, UA: NEGATIVE mg/dL
Hgb urine dipstick: NEGATIVE
Ketones, ur: NEGATIVE mg/dL
Nitrite: NEGATIVE
Protein, ur: NEGATIVE mg/dL
Specific Gravity, Urine: 1.016 (ref 1.005–1.030)
pH: 5 (ref 5.0–8.0)

## 2015-06-25 LAB — URINE MICROSCOPIC-ADD ON: RBC / HPF: NONE SEEN RBC/hpf (ref 0–5)

## 2015-06-25 MED ORDER — HYDROCODONE-ACETAMINOPHEN 5-325 MG PO TABS: 1.0000 | ORAL_TABLET | ORAL | Status: DC | PRN

## 2015-06-25 MED ORDER — DEXAMETHASONE 4 MG PO TABS
4.0000 mg | ORAL_TABLET | Freq: Two times a day (BID) | ORAL | Status: DC
  Administered 2015-06-25 – 2015-06-27 (×5): 4 mg via ORAL
  Filled 2015-06-25 (×6): qty 1

## 2015-06-25 MED ORDER — GABAPENTIN 300 MG PO CAPS
300.0000 mg | ORAL_CAPSULE | Freq: Three times a day (TID) | ORAL | Status: DC
  Administered 2015-06-25 – 2015-06-27 (×7): 300 mg via ORAL
  Filled 2015-06-25 (×9): qty 1

## 2015-06-25 MED ORDER — CYCLOBENZAPRINE HCL 5 MG PO TABS: 5.0000 mg | ORAL_TABLET | Freq: Two times a day (BID) | ORAL | Status: DC | PRN

## 2015-06-25 MED ORDER — HYDROMORPHONE HCL 1 MG/ML IJ SOLN: 1.0000 mg | INTRAMUSCULAR | Status: DC | PRN

## 2015-06-25 NOTE — Progress Notes (Signed)
Noted 2 Butrans patches on patient upon assessment. One to buttock and the other to her Lt abd. Patient states patches are once a week and that she should only have one on. Removed patch on buttock. Patch to abd still in place. Will continue to monitor patient.

## 2015-06-25 NOTE — Progress Notes (Signed)
[facility] PROGRESS NOTE  Crystal [name] FAO:[id number] DOB: 16-Dec-1950 DOA: 06/24/2015 PCP: Alesia Richards, MD  Assessment/Plan: 1. Post-operative pain, intractable  - Anterior approach decompression/diskectomy/fusion of C3-7 performed 06/14/15 by Dr. Vertell Limber,  - CT C spine stable, seems somewhat overmedicated and drowsy and hence very poor Po intake at home for 1 week -was supposedly taking Advil, tramadol, hydromorphone, buprenorphine, baclofen, Flexeril, and Valium at home - Continue Norco, Dilaudid for severe pain, change flexeril to BID PRN, continue home Butrens patch, cut down Gabapentin due to oversedation, takes 819m QID at home -change decadron to PO, continue home dose CYmbalta - Aggressive bowel regimen - Monitor with continuous pulse ox  -PT eval -d/w Dr.Kritzer last pm, recommended Decadron -incentive spirometry  2. Acute renal failure, dehydration - SCr 1.89, up from baseline of <1  - Suspect pre-renal and NSAIDs -improved with IVF, cut down rate  3. Chronic HTN - Low BP here prior to IVF  - resume cozaar  4. Depression - Continue home Cymbalta  - agree that may be contributing to #1 above   5. Hypothyroidism - Continue home Synthroid   6. Non insulin-dependent DM - monitor on steroids, not on meds at home, FU Hbaic   7. OSA  - CPAP qHS  - on continuous pulse ox and ETCO2 as above   DVT ppx: SQ Heparin   Code Status: FUll COde Family Communication: none at bedside, will call spouse Disposition Plan: home soon   Consultants:  D/w NSG, Dr.Kritzer  HPI/Subjective: Feels ok this am, sleeping all am per RN  Objective: Filed Vitals:   06/24/15 2040 06/25/15 0448  BP: 99/56 131/62  Pulse: 87 92  Temp: 98.2 F (36.8 C) 97.8 F (36.6 C)  Resp: 16 16    Intake/Output Summary (Last 24 hours) at 06/25/15 0849 Last data filed at 06/25/15 0700  Gross per 24 hour  Intake    615 ml  Output      0 ml  Net    615 ml    Filed Weights   06/24/15 2040  Weight: 82.2 kg (181 lb 3.5 oz)    Exam:   General: somnolent, drowsy, but arousable and talking, C collar on  Cardiovascular: S1s2/RRR  Respiratory:ROnchi at bases  Abdomen: soft, NT, Bs present  Musculoskeletal: no edema c/c  Data Reviewed: Basic Metabolic Panel:  Recent Labs Lab 06/24/15 1647 06/24/15 2109 06/25/15 0449  NA 135  --  138  K 3.8  --  4.8  CL 97*  --  103  CO2 30  --  25  GLUCOSE 117*  --  150*  BUN 37*  --  25*  CREATININE 1.89*  --  0.92  CALCIUM 9.1  --  8.8*  MG  --  1.8  --    Liver Function Tests: No results for input(s): AST, ALT, ALKPHOS, BILITOT, PROT, ALBUMIN in the last 168 hours. No results for input(s): LIPASE, AMYLASE in the last 168 hours. No results for input(s): AMMONIA in the last 168 hours. CBC:  Recent Labs Lab 06/24/15 1647 06/25/15 0449  WBC 12.8* 10.6*  NEUTROABS 10.7*  --   HGB 13.6 13.3  HCT 42.0 40.2  MCV 91.7 90.7  PLT 275 250   Cardiac Enzymes: No results for input(s): CKTOTAL, CKMB, CKMBINDEX, TROPONINI in the last 168 hours. BNP (last 3 results)  Recent Labs  06/24/15 2109  BNP 31.8    ProBNP (last 3 results) No results for input(s): PROBNP in the last 8760  hours.  CBG:  Recent Labs Lab 06/24/15 1621 06/24/15 1632 06/25/15 0750  GLUCAP 112* 104* 127*    No results found for this or any previous visit (from the past 240 hour(s)).   Studies: Ct Cervical Spine Wo Contrast  06/24/2015  CLINICAL DATA:  64 year old female with with recent cervical discectomy presenting with fall and neck pain. EXAM: CT CERVICAL SPINE WITHOUT CONTRAST TECHNIQUE: Multidetector CT imaging of the cervical spine was performed without intravenous contrast. Multiplanar CT image reconstructions were also generated. COMPARISON:  Radiograph dated 06/14/2015 FINDINGS: There is no acute fracture or subluxation of the cervical spine.There is postsurgical changes of C3-C7 disc spacers with  anterior fusion plate and screws. The bones are osteopenic. There is multilevel degenerative changes of the spine. There is C2-C3 ankylosis. NewThe odontoid and spinous processes are intact.There is normal anatomic alignment of the C1-C2 lateral masses. The visualized soft tissues appear unremarkable. IMPRESSION: No acute fracture or subluxation. C3-C7 anterior fusion plate and screws. Electronically Signed   By: Anner Crete M.D.   On: 06/24/2015 18:19   Portable Chest 1 View  06/24/2015  CLINICAL DATA:  Hypoxemia EXAM: PORTABLE CHEST 1 VIEW COMPARISON:  07/11/2010 FINDINGS: Elevated right hemidiaphragm with mild right lower lobe atelectasis. Mild left lower lobe airspace disease. Negative for heart failure or effusion. Chronic degenerative changes in both shoulders. Resection distal clavicle bilaterally. IMPRESSION: Elevated right hemidiaphragm Mild left lower lobe atelectasis/infiltrate. Electronically Signed   By: Franchot Gallo M.D.   On: 06/24/2015 20:24    Scheduled Meds: . aspirin EC  81 mg Oral Daily  . buPROPion  300 mg Oral Daily  . dexamethasone  4 mg Oral Q12H  . docusate sodium  100 mg Oral BID  . DULoxetine  60 mg Oral Daily  . gabapentin  300 mg Oral TID  . heparin  5,000 Units Subcutaneous 3 times per day  . insulin aspart  0-15 Units Subcutaneous TID WC  . levothyroxine  100 mcg Oral QAC breakfast  . lubiprostone  24 mcg Oral BID WC  . pantoprazole  40 mg Oral Daily  . sodium chloride  3 mL Intravenous Q12H   Continuous Infusions: . 0.9 % NaCl with KCl 20 mEq / L 100 mL/hr at 06/25/15 0051   Antibiotics Given (last 72 hours)    None      Principal Problem:   Post-op pain Active Problems:   Obstructive sleep apnea   Essential hypertension   T2_NIDDM   Hypothyroid   Depression, controlled   ASCVD (arteriosclerotic cardiovascular disease)   Spinal stenosis in cervical region   Acute renal failure (ARF) ([place])   Intractable pain    Time spent:  48mn    Malli Falotico  [facility] Pager [phone] If 7PM-7AM, please contact night-coverage at [email or web address], password [email or web address]12/26/2016, 8:49 AM

## 2015-06-25 NOTE — Progress Notes (Signed)
OT Cancellation Note  Patient Details Name: Crystal [name] MRN: [id number] DOB: 14-Mar-1951   Cancelled Treatment:    Reason Eval/Treat Not Completed: Other (comment)  Pt sleeping in bed. OT introduced self to husband and husband felt pt needed to rest at this time. Will perform OT eval next day as schedule allows.  Dorena Bodo [place], [place] [phone] 06/25/2015, 3:04 PM

## 2015-06-25 NOTE — Evaluation (Signed)
Physical Therapy Evaluation Patient Details Name: Crystal [name] MRN: [id number] DOB: 07-02-1950 Today's Date: 06/25/2015   History of Present Illness  64 yo female admitted with post op pain. ACDF C3-C7 06/14/15, RLS, fibromyalgia, TIA, gait d/o, HTN  Clinical Impression  On eval, pt required Min guard-Min assist for mobility-walked ~150 feet with use of RW. Assessed gait without walker and pt was very unsteady/reaching out for objects in environment to hold on to. Processing appears a little slow and awareness mildly impaired-repeated questioning and instructions given throughout session. Discussed d/c plan-pt states her husband is usually available at home. Recommend HHPT and 24 hour supervision/assist.     Follow Up Recommendations Home health PT;Supervision/Assistance - 24 hour    Equipment Recommendations  None recommended by PT (pt states she has a walker at home)    Recommendations for Other Services       Precautions / Restrictions Precautions Precautions: Cervical Required Braces or Orthoses: Cervical Brace Cervical Brace: At all times (per last PT notes) Restrictions Weight Bearing Restrictions: No      Mobility  Bed Mobility               General bed mobility comments: pt standing in bathroom with nursing  Transfers Overall transfer level: Needs assistance   Transfers: Sit to/from Stand Sit to Stand: Min guard         General transfer comment: close guard for safety.   Ambulation/Gait Ambulation/Gait assistance: Min assist;Min guard Ambulation Distance (Feet): 175 Feet (25'x1) Assistive device: Rolling walker (2 wheeled);None Gait Pattern/deviations: Step-through pattern;Decreased stride length     General Gait Details: close guard when ambulating with walker. Min assist when ambulating without device. Observed pt reaching out for objects in environment to hold on to once walker was taken away. Pt voiced feeling of unsteadiness.   Stairs            Wheelchair Mobility    Modified Rankin (Stroke Patients Only)       Balance Overall balance assessment: Needs assistance         Standing balance support: During functional activity Standing balance-Leahy Scale: Fair Standing balance comment: needs RW for support.                             Pertinent Vitals/Pain Pain Assessment: 0-10 Pain Score: 3  Pain Location: worse with sitting. "tongue is sore". pt denied neck or UE pain during PT session Pain Intervention(s): Monitored during session;Repositioned    Home Living Family/patient expects to be discharged to:: Private residence Living Arrangements: Spouse/significant other Available Help at Discharge: Family;Available 24 hours/day Type of Home: House Home Access: Stairs to enter   [employer] of Steps: 3 Home Layout: Multi-level;Bed/bath upstairs Home Equipment: Walker - 2 wheels;Cane - single point;Bedside commode;Shower seat;Wheelchair - power;Grab bars - tub/shower      Prior Function Level of Independence: Independent               Hand Dominance        Extremity/Trunk Assessment   Upper Extremity Assessment: Defer to OT evaluation           Lower Extremity Assessment: Generalized weakness         Communication   Communication: No difficulties  Cognition Arousal/Alertness: Awake/alert Behavior During Therapy:  (easily distracted. appears HOH at times?) Overall Cognitive Status: No family/caregiver present to determine baseline cognitive functioning  General Comments      Exercises        Assessment/Plan    PT Assessment Patient needs continued PT services  PT Diagnosis Difficulty walking   PT Problem List Decreased balance;Decreased safety awareness;Decreased mobility;Pain  PT Treatment Interventions Gait training;Functional mobility training;Therapeutic activities;Patient/family education;Balance training;DME  instruction;Therapeutic exercise   PT Goals (Current goals can be found in the Care Plan section) Acute Rehab PT Goals Patient Stated Goal: to get out of here PT Goal Formulation: With patient Time For Goal Achievement: 08/09/15    Frequency Min 3X/week   Barriers to discharge        Co-evaluation               End of Session Equipment Utilized During Treatment: Cervical collar Activity Tolerance: Patient tolerated treatment well Patient left: in chair;with call bell/phone within reach;with chair alarm set      Functional Assessment Tool Used: clinical judgement    Time: 1139-1203 PT Time Calculation (min) (ACUTE ONLY): 24 min   Charges:   PT Evaluation $Initial PT Evaluation Tier I: 1 Procedure     PT G Codes:   PT G-Codes **NOT FOR INPATIENT CLASS** Functional Assessment Tool Used: clinical judgement    Rebeca Alert, MPT Pager: [phone]

## 2015-06-25 NOTE — Progress Notes (Signed)
Patient unable to wear cpap due to cervical collar. Patient has on endtidal. RT will continue to monitor.

## 2015-06-26 DIAGNOSIS — G8918 Other acute postprocedural pain: Secondary | ICD-10-CM | POA: Diagnosis not present

## 2015-06-26 DIAGNOSIS — E119 Type 2 diabetes mellitus without complications: Secondary | ICD-10-CM | POA: Diagnosis not present

## 2015-06-26 DIAGNOSIS — N179 Acute kidney failure, unspecified: Secondary | ICD-10-CM | POA: Diagnosis not present

## 2015-06-26 LAB — CBC
HCT: 34.9 % — ABNORMAL LOW (ref 36.0–46.0)
Hemoglobin: 11.4 g/dL — ABNORMAL LOW (ref 12.0–15.0)
MCH: 29.3 pg (ref 26.0–34.0)
MCHC: 32.7 g/dL (ref 30.0–36.0)
MCV: 89.7 fL (ref 78.0–100.0)
Platelets: 253 10*3/uL (ref 150–400)
RBC: 3.89 MIL/uL (ref 3.87–5.11)
RDW: 13.4 % (ref 11.5–15.5)
WBC: 6.3 10*3/uL (ref 4.0–10.5)

## 2015-06-26 LAB — HEMOGLOBIN A1C
Hgb A1c MFr Bld: 6.7 % — ABNORMAL HIGH (ref 4.8–5.6)
Mean Plasma Glucose: 146 mg/dL

## 2015-06-26 LAB — BASIC METABOLIC PANEL
Anion gap: 7 (ref 5–15)
BUN: 21 mg/dL — ABNORMAL HIGH (ref 6–20)
CO2: 28 mmol/L (ref 22–32)
Calcium: 8.8 mg/dL — ABNORMAL LOW (ref 8.9–10.3)
Chloride: 106 mmol/L (ref 101–111)
Creatinine, Ser: 0.82 mg/dL (ref 0.44–1.00)
GFR calc Af Amer: >60 mL/min (ref >60)
GFR calc non Af Amer: >60 mL/min (ref >60)
Glucose, Bld: 157 mg/dL — ABNORMAL HIGH (ref 65–99)
Potassium: 3.8 mmol/L (ref 3.5–5.1)
Sodium: 141 mmol/L (ref 135–145)

## 2015-06-26 LAB — GLUCOSE, CAPILLARY
Glucose-Capillary: 125 mg/dL — ABNORMAL HIGH (ref 65–99)
Glucose-Capillary: 127 mg/dL — ABNORMAL HIGH (ref 65–99)
Glucose-Capillary: 131 mg/dL — ABNORMAL HIGH (ref 65–99)
Glucose-Capillary: 173 mg/dL — ABNORMAL HIGH (ref 65–99)

## 2015-06-26 MED ORDER — GABAPENTIN 300 MG PO CAPS: 300.0000 mg | ORAL_CAPSULE | Freq: Three times a day (TID) | ORAL | Status: DC

## 2015-06-26 MED ORDER — POLYETHYLENE GLYCOL 3350 17 G PO PACK: 17.0000 g | PACK | Freq: Every day | ORAL | Status: DC | PRN

## 2015-06-26 MED ORDER — DEXAMETHASONE 2 MG PO TABS
2.0000 mg | ORAL_TABLET | Freq: Two times a day (BID) | ORAL | Status: DC
Start: 2015-06-26 — End: 2015-07-05

## 2015-06-26 MED ORDER — CYCLOBENZAPRINE HCL 5 MG PO TABS: 5.0000 mg | ORAL_TABLET | Freq: Two times a day (BID) | ORAL | Status: DC | PRN

## 2015-06-26 MED ORDER — HYDROCODONE-ACETAMINOPHEN 5-325 MG PO TABS
1.0000 | ORAL_TABLET | ORAL | Status: DC | PRN
Start: 2015-06-26 — End: 2015-08-09

## 2015-06-26 NOTE — Progress Notes (Signed)
Physical Therapy Treatment Patient Details Name: Crystal [name] MRN: [id number] DOB: Nov 22, 1950 Today's Date: 06/26/2015    History of Present Illness 64 yo female admitted with post op pain. ACDF C3-C7 06/14/15, RLS, fibromyalgia, TIA, gait d/o, HTN    PT Comments    Hard collar is difficult to maintain correct chin placement.  Pt in bed with pillow under head and top of collar in pt's mouth.  Removed pillow and adjusted without movement.  Then instructed pt to "log roll" OOB to decrease stress/strain neck.  Pt able to sit EOB Indep with no C/O's.  Assisted with amb in hallway using a walker due to mild unsteadiness.  HIGH FALL RISK due to compromised field of vision as pt is unable to look down at feet/obsticles on floor.  Pt will need assist at home at times.    Follow Up Recommendations  Home health PT     Equipment Recommendations  None recommended by PT    Recommendations for Other Services       Precautions / Restrictions Precautions Precautions: Cervical Required Braces or Orthoses: Cervical Brace Cervical Brace: At all times;Hard collar Restrictions Weight Bearing Restrictions: No Other Position/Activity Restrictions: WBAT    Mobility  Bed Mobility Overal bed mobility: Needs Assistance Bed Mobility: Rolling;Sidelying to Sit           General bed mobility comments: instructed to perform side rolling then sidelying to sit with VC's on proper tech  Transfers Overall transfer level: Needs assistance Equipment used: Rolling walker (2 wheeled) Transfers: Sit to/from Stand Sit to Stand: Supervision;Min guard         General transfer comment: for safety; cues for UE placement  Ambulation/Gait Ambulation/Gait assistance: Min guard;Min assist Ambulation Distance (Feet): 145 Feet Assistive device: Rolling walker (2 wheeled);None   Gait velocity: decreased   General Gait Details: slightly more unsteady amb without walker and HIGH FALL RISK due to  comprimised field of vision due to hard crvical collar, pt is unable to flex neck to look down at feet/obsticles on floor.     Stairs            Wheelchair Mobility    Modified Rankin (Stroke Patients Only)       Balance                                    Cognition Arousal/Alertness: Awake/alert Behavior During Therapy: WFL for tasks assessed/performed Overall Cognitive Status: Within Functional Limits for tasks assessed                      Exercises      General Comments        Pertinent Vitals/Pain Pain Assessment: Faces Faces Pain Scale: Hurts little more Pain Location: throat Pain Descriptors / Indicators: Discomfort;Constant Pain Intervention(s): Monitored during session    Home Living                      Prior Function            PT Goals (current goals can now be found in the care plan section) Progress towards PT goals: Progressing toward goals    Frequency  Min 3X/week    PT Plan      Co-evaluation             End of Session Equipment Utilized During Treatment: Cervical collar;Gait belt Activity Tolerance:  Patient tolerated treatment well Patient left: in chair;with call bell/phone within reach;with chair alarm set     Time: 1125-1150 PT Time Calculation (min) (ACUTE ONLY): 25 min  Charges:  $Gait Training: 8-22 mins $Therapeutic Activity: 8-22 mins                    G Codes:      Felecia Shelling  PTA [facility]  Acute  Rehab Pager      [phone]

## 2015-06-26 NOTE — Discharge Summary (Signed)
Physician Discharge Summary  Crystal [name] OQH:[id number] DOB: [birth year] DOA: 06/24/2015  PCP: Alesia Richards, MD  Admit date: 06/24/2015 Discharge date: 06/26/2015  Time spent: 45 minutes  Recommendations for Outpatient Follow-up:  1. Dr.Stern in 1weeks 2. Dr.McKeown in 1 week 3. Home health Pt,OT, Aide 4. Caution against increasing or adding new sedating meds   Discharge Diagnoses:  Principal Problem:   Post-op pain Active Problems:   Obstructive sleep apnea   Essential hypertension   T2_NIDDM   Hypothyroid   Depression, controlled   ASCVD (arteriosclerotic cardiovascular disease)   Spinal stenosis in cervical region   Acute renal failure (ARF) (HCC)   Intractable pain   Discharge Condition: stable  Diet recommendation: Diabetic, heart healthy  Filed Weights   06/24/15 2040  Weight: 82.2 kg (181 lb 3.5 oz)    History of present illness:  Chief Complaint: Intractable pain, weakness  HPI: Crystal [name] is a 64 y.o. female with PMH of hypertension, hyperlipidemia, fibromyalgia, OSA, depression, and cervical spinal stenosis status post anterior cervical decompression with diskectomy and fusion on 06/14/2015, presenting to the ED for intractable neck pain despite aggressive attempts to manage at home. The surgery was performed by Dr. Vertell Limber with no immediate complications and the patient was discharged home the following day without incident. She was discharged with a cervical collar, Flexeril and Norco. She was unable to achieve adequate pain-control despite also using tramadol, baclofen, and PO Dilaudid. Reports that pain was continuous, focused at cervical spine, with intermittent radiation down the spine and down both arms. In ED, patient was found to be afebrile, saturating well on room air, with vital signs stable. Blood work was notable for SCr of 1.89, up from baseline of <1. A leukocytosis to 12.8k was also noted. CT cervical spine was obtained and  failed to demonstrate any acute fracture, subluxation, or explanation for patient's intractable pain. On-call neurosurgeon, Dr. Hal Neer was contacted from the ED and recommended Decadron and muscle relaxants  Hospital Course:  . Post-operative pain, intractable  - Anterior approach decompression/diskectomy/fusion of C3-7 performed 06/14/15 by Dr. Vertell Limber,  - CT C spine stable, seems somewhat overmedicated and drowsy and hence very poor Po intake at home for 1 week -was supposedly taking Advil, tramadol, hydromorphone, buprenorphine, baclofen, Flexeril, and Valium at home - Admitting MD d/w Dr.Kritzer covering for Dr.Stern he recommended trial of decadron, supportive care. -She was started on Norco, and Flexeril PRN, Advil,, tramadol, DIlaudid PO, Baclofen and Valium were stopped, we also cut down her gabapentin dose due to oversedation -changed decadron to PO wean over 3-4days, will not continue for long since it delays healing. -continued on home dose Cymbalta and  bowel regimen started -PT eval completed, ambulated in halls, HHPT/OT recommended and set up -she is advised to FU with PCP and Dr.Stern, she did not require much PRNs at all in the hospital  2. Acute renal failure, dehydration - SCr 1.89, up from baseline of <1 on admission - Suspect pre-renal and NSAIDs induced - resolved with IVF  3. Chronic HTN - Low BP here prior to IVF , since improved and stable - resumed Bisoprolol/hctz and stopped cozaar  4. Depression - Continue home Cymbalta  - agree that may be contributing to #1 above   5. Hypothyroidism - Continue home Synthroid   6. Non insulin-dependent DM - CBGs stable despite steroids, not on meds at home  7. OSA  - CPAP qHS   Consultations:  D/w Dr.Kritzer  Discharge Exam: Filed Vitals:  06/25/15 2142 06/26/15 0503  BP: 127/71 133/78  Pulse: 105 98  Temp: 98 F (36.7 C) 97.5 F (36.4 C)  Resp: 16 16    General:  AAOx3 Cardiovascular:S1S2/RRR Respiratory:CTAB  Discharge Instructions   Discharge Instructions    Diet - low sodium heart healthy    Complete by:  As directed      Increase activity slowly    Complete by:  As directed           Current Discharge Medication List    START taking these medications   Details  dexamethasone (DECADRON) 2 MG tablet Take 1 tablet (2 mg total) by mouth 2 (two) times daily. For 2days then STOP Qty: 4 tablet, Refills: 0    gabapentin (NEURONTIN) 300 MG capsule Take 1 capsule (300 mg total) by mouth 3 (three) times daily. Qty: 60 capsule, Refills: 0    HYDROcodone-acetaminophen (NORCO/VICODIN) 5-325 MG tablet Take 1 tablet by mouth every 4 (four) hours as needed for moderate pain or severe pain. Qty: 30 tablet, Refills: 0    polyethylene glycol (MIRALAX / GLYCOLAX) packet Take 17 g by mouth daily as needed for mild constipation. Qty: 14 each, Refills: 0      CONTINUE these medications which have CHANGED   Details  cyclobenzaprine (FLEXERIL) 5 MG tablet Take 1 tablet (5 mg total) by mouth 2 (two) times daily as needed for muscle spasms. Qty: 20 tablet, Refills: 0      CONTINUE these medications which have NOT CHANGED   Details  aspirin EC 81 MG tablet Take 81 mg by mouth daily.    bisoprolol-hydrochlorothiazide (ZIAC) 10-6.25 MG tablet TAKE ONE TABLET BY MOUTH ONE TIME DAILY FOR BLOOD PRESSURE Qty: 90 tablet, Refills: 4    buprenorphine (BUTRANS - DOSED MCG/HR) 10 MCG/HR PTWK patch Place 10 mcg onto the skin once a week. wednesday    buPROPion (WELLBUTRIN XL) 300 MG 24 hr tablet Take 300 mg by mouth daily.    dexlansoprazole (DEXILANT) 60 MG capsule Take 1 capsule (60 mg total) by mouth daily. Qty: 30 capsule, Refills: 0    DULoxetine (CYMBALTA) 60 MG capsule TAKE 1 CAPSULE BY MOUTH DAILY. Qty: 30 capsule, Refills: 2    fenofibrate micronized (LOFIBRA) 134 MG capsule TAKE 1 CAPSULE BY MOUTH DAILY BEFORE BREAKFAST Qty: 30 capsule, Refills:  0    levothyroxine (SYNTHROID, LEVOTHROID) 100 MCG tablet TAKE 1 TABLET BY MOUTH ON TUES, WED, THURS, SAT AND SUN. TAKE 1& 1/2 TABLET ON MON AND FRI Qty: 102 tablet, Refills: 3    lubiprostone (AMITIZA) 24 MCG capsule Take 24 mcg by mouth 2 (two) times daily with a meal.    oxybutynin (DITROPAN) 5 MG tablet take 1 tablet two times a day Qty: 180 tablet, Refills: PRN    ranitidine (ZANTAC) 150 MG tablet Take 150 mg by mouth at bedtime.    Vitamin D, Ergocalciferol, (DRISDOL) 50000 UNITS CAPS capsule Take 1 capsule (50,000 Units total) by mouth daily. Qty: 30 capsule, Refills: PRN    fluticasone (FLONASE) 50 MCG/ACT nasal spray USE TWO SPRAYS IN EACH NOSTRIL DAILY AS NEEDED Qty: 16 g, Refills: 99    neomycin-polymyxin-hydrocortisone (CORTISPORIN) otic solution 6  - 8 drops to affected ear 4 x daily for Otitis Externa Qty: 10 mL, Refills: 0   Associated Diagnoses: Otitis, externa, infective, unspecified laterality      STOP taking these medications     diazepam (VALIUM) 5 MG tablet      doxycycline (VIBRAMYCIN) 100 MG  capsule      gabapentin (NEURONTIN) 800 MG tablet      HYDROmorphone (DILAUDID) 2 MG tablet      losartan (COZAAR) 50 MG tablet      rOPINIRole (REQUIP) 4 MG tablet      acyclovir (ZOVIRAX) 800 MG tablet      baclofen (LIORESAL) 20 MG tablet      cetirizine (ZYRTEC) 10 MG tablet      pilocarpine (SALAGEN) 5 MG tablet      prochlorperazine (COMPAZINE) 5 MG tablet        Allergies  Allergen Reactions  . Atorvastatin Other (See Comments)    Extreme pain  . Codeine Other (See Comments)    Headache  . Pravastatin Other (See Comments)    Exreme pain  . Statins Other (See Comments)    Extreme pain  . Amitriptyline Other (See Comments)    Unspecified  . Fish Oil Nausea Only  . Linzess [Linaclotide] Other (See Comments)    Unspecified  . Nuvigil [Armodafinil] Other (See Comments)    Unspecified  . Erythromycin Other (See Comments)    Reaction  unspecified  . Erythromycin Base Rash  . Flax Seed [Bio-Flax] Rash    Linseed  . Hydrocodone-Acetaminophen Rash  . Morphine Nausea And Vomiting and Rash  . Nsaids Nausea Only  . Sertraline Rash  . Sinemet [Carbidopa W-Levodopa] Rash  . Sulfamethoxazole Rash   Follow-up Information    Follow up with MCKEOWN,WILLIAM DAVID, MD. Schedule an appointment as soon as possible for a visit in 1 week.   Specialty:  Internal Medicine   Contact information:   [address] [place] [address] [phone]       Follow up with Peggyann Shoals, MD. Schedule an appointment as soon as possible for a visit in 1 week.   Specialty:  Neurosurgery   Contact information:   [address] [place] [address] [phone]        The results of significant diagnostics from this hospitalization (including imaging, microbiology, ancillary and laboratory) are listed below for reference.    Significant Diagnostic Studies: Dg Cervical Spine 2-3 Views  06/14/2015  CLINICAL DATA:  Cervical spine surgery. EXAM: CERVICAL SPINE - 2-3 VIEW COMPARISON:  MRI 06/07/2015. FINDINGS: Metallic marker noted initially at the C3-C4 level. C3-C4 and C4-C5 anterior fusion. Lower cervical spine not well imaged. IMPRESSION: Postsurgical changes cervical spine. Electronically Signed   By: Marcello Moores  Register   On: 06/14/2015 11:45   Ct Cervical Spine Wo Contrast  06/24/2015  CLINICAL DATA:  64 year old female with with recent cervical discectomy presenting with fall and neck pain. EXAM: CT CERVICAL SPINE WITHOUT CONTRAST TECHNIQUE: Multidetector CT imaging of the cervical spine was performed without intravenous contrast. Multiplanar CT image reconstructions were also generated. COMPARISON:  Radiograph dated 06/14/2015 FINDINGS: There is no acute fracture or subluxation of the cervical spine.There is postsurgical changes of C3-C7 disc spacers with anterior fusion plate and screws. The bones are  osteopenic. There is multilevel degenerative changes of the spine. There is C2-C3 ankylosis. NewThe odontoid and spinous processes are intact.There is normal anatomic alignment of the C1-C2 lateral masses. The visualized soft tissues appear unremarkable. IMPRESSION: No acute fracture or subluxation. C3-C7 anterior fusion plate and screws. Electronically Signed   By: Anner Crete M.D.   On: 06/24/2015 18:19   Portable Chest 1 View  06/24/2015  CLINICAL DATA:  Hypoxemia EXAM: PORTABLE CHEST 1 VIEW COMPARISON:  07/11/2010 FINDINGS: Elevated right hemidiaphragm  with mild right lower lobe atelectasis. Mild left lower lobe airspace disease. Negative for heart failure or effusion. Chronic degenerative changes in both shoulders. Resection distal clavicle bilaterally. IMPRESSION: Elevated right hemidiaphragm Mild left lower lobe atelectasis/infiltrate. Electronically Signed   By: Franchot Gallo M.D.   On: 06/24/2015 20:24    Microbiology: No results found for this or any previous visit (from the past 240 hour(s)).   Labs: Basic Metabolic Panel:  Recent Labs Lab 06/24/15 1647 06/24/15 2109 06/25/15 0449 06/26/15 0454  NA 135  --  138 141  K 3.8  --  4.8 3.8  CL 97*  --  103 106  CO2 30  --  25 28  GLUCOSE 117*  --  150* 157*  BUN 37*  --  25* 21*  CREATININE 1.89*  --  0.92 0.82  CALCIUM 9.1  --  8.8* 8.8*  MG  --  1.8  --   --    Liver Function Tests: No results for input(s): AST, ALT, ALKPHOS, BILITOT, PROT, ALBUMIN in the last 168 hours. No results for input(s): LIPASE, AMYLASE in the last 168 hours. No results for input(s): AMMONIA in the last 168 hours. CBC:  Recent Labs Lab 06/24/15 1647 06/25/15 0449 06/26/15 0454  WBC 12.8* 10.6* 6.3  NEUTROABS 10.7*  --   --   HGB 13.6 13.3 11.4*  HCT 42.0 40.2 34.9*  MCV 91.7 90.7 89.7  PLT 275 250 253   Cardiac Enzymes: No results for input(s): CKTOTAL, CKMB, CKMBINDEX, TROPONINI in the last 168 hours. BNP: BNP (last 3  results)  Recent Labs  06/24/15 2109  BNP 31.8    ProBNP (last 3 results) No results for input(s): PROBNP in the last 8760 hours.  CBG:  Recent Labs Lab 06/25/15 1205 06/25/15 1649 06/25/15 2142 06/26/15 0742 06/26/15 1148  GLUCAP 147* 128* 139* 125* 127*       SignedDomenic Polite MD  FACP  [facility] 06/26/2015, 1:21 PM

## 2015-06-26 NOTE — Care Management Note (Signed)
Case Management Note  Patient Details  Name: Crystal [name] MRN: [id number] Date of Birth: 1951-06-01  Subjective/Objective:  64 y/o f admitted w/post-op pain. Will talk to patient/spouse about HHPT/HHOT ordered when spouse arrives to hospital.                   Action/Plan:d/c plan home w/HHC.   Expected Discharge Date:                  Expected Discharge Plan:  Home w Home Health Services  In-House Referral:     Discharge planning Services     Post Acute Care Choice:    Choice offered to:  Spouse  DME Arranged:    DME Agency:     HH Arranged:    HH Agency:     Status of Service:  In process, will continue to follow  Medicare Important Message Given:    Date Medicare IM Given:    Medicare IM give by:    Date Additional Medicare IM Given:    Additional Medicare Important Message give by:     If discussed at Long Length of Stay Meetings, dates discussed:    Additional Comments:  Lanier Clam, RN 06/26/2015, 1:52 PM

## 2015-06-26 NOTE — Care Management Note (Signed)
Case Management Note  Patient Details  Name: Crystal [name] MRN: [id number] Date of Birth: February 26, 1951  Subjective/Objective:spoke to spouse/patient in rm about HHC choice-[facility]-TC rep Clydie Braun aware of referral, she will confirm if able to accept.  Patient/spouse had lots of questions about HHC services, & dme they wanted-shower chair-informed that it was not recommended, & it is an out of pocket expense-they can get 1 @ any retail store-spouse mentioned Bed,bath, & beyond.they already have a rw.  Await [facility] to confirm if able to accept.                    Action/Plan:   Expected Discharge Date:                  Expected Discharge Plan:  Home w Home Health Services  In-House Referral:     Discharge planning Services     Post Acute Care Choice:    Choice offered to:  Spouse  DME Arranged:    DME Agency:     HH Arranged:  PT, OT HH Agency:     Status of Service:  In process, will continue to follow  Medicare Important Message Given:    Date Medicare IM Given:    Medicare IM give by:    Date Additional Medicare IM Given:    Additional Medicare Important Message give by:     If discussed at Long Length of Stay Meetings, dates discussed:    Additional Comments:  Lanier Clam, RN 06/26/2015, 2:15 PM

## 2015-06-26 NOTE — Evaluation (Signed)
Occupational Therapy Evaluation Patient Details Name: Crystal [name] MRN: [id number] DOB: June 01, 1951 Today's Date: 06/26/2015    History of Present Illness 64 yo female admitted with post op pain. ACDF C3-C7 06/14/15, RLS, fibromyalgia, TIA, gait d/o, HTN   Clinical Impression   Pt was admitted for the above.  Unsure of PLOF since surgery earlier this month.  Pt does not recall anything since sx.  She currently needs overall min guard to occasional min A for adls. She will benefit from continued OT with supervision level goals in acute setting    Follow Up Recommendations  Home health OT;Supervision/Assistance - 24 hour    Equipment Recommendations  None recommended by OT    Recommendations for Other Services       Precautions / Restrictions Precautions Precautions: Cervical Required Braces or Orthoses: Cervical Brace Cervical Brace: At all times Restrictions Weight Bearing Restrictions: No      Mobility Bed Mobility Overal bed mobility: Modified Independent                Transfers   Equipment used: Rolling walker (2 wheeled) Transfers: Sit to/from Stand Sit to Stand: Min guard         General transfer comment: for safety; cues for UE placement    Balance                                            ADL Overall ADL's : Needs assistance/impaired     Grooming: Wash/dry hands;Min guard;Standing   Upper Body Bathing: Set up;Sitting   Lower Body Bathing: Min guard;Sit to/from stand   Upper Body Dressing : Set up;Sitting   Lower Body Dressing: Min guard;Sit to/from stand   Toilet Transfer: Min guard;Minimal assistance;Comfort height toilet;RW   Toileting- Architect and Hygiene: Min guard;Sit to/from stand         General ADL Comments: pt ambulated to bathroom twice:  cues for safety with RW.  Assisted her in controlling descent one time.  Cues for UE placement when standing.  Neck collar appears loose--tightened  straps and pt could not tolerate.  RN is aware that they were trying to switch out for smaller one     Vision     Perception     Praxis      Pertinent Vitals/Pain Pain Assessment: No/denies pain.  Pt mentioned that she has had 2 back surgeries and has fibromyalgia.  No c/o pain this session     Hand Dominance     Extremity/Trunk Assessment Upper Extremity Assessment Upper Extremity Assessment: Generalized weakness (per report; MMT deferred; able to lift bil UEs to 90)           Communication Communication Communication: No difficulties   Cognition Arousal/Alertness: Awake/alert Behavior During Therapy: WFL for tasks assessed/performed Overall Cognitive Status: No family/caregiver present to determine baseline cognitive functioning (no memory of events since sx; decreased safety)                     General Comments       Exercises       Shoulder Instructions      Home Living Family/patient expects to be discharged to:: Private residence Living Arrangements: Spouse/significant other Available Help at Discharge: Family;Available 24 hours/day  Additional Comments: per PT note, pt has tub/seat/clamp on grab bars      Prior Functioning/Environment          Comments: pt unable to answer PLOF questions since neck sx--may have needed assistance    OT Diagnosis: Generalized weakness   OT Problem List: Decreased strength;Decreased activity tolerance;Impaired balance (sitting and/or standing);Decreased safety awareness;Decreased knowledge of precautions   OT Treatment/Interventions: Self-care/ADL training;Therapeutic exercise;Therapeutic activities;Patient/family education;Balance training    OT Goals(Current goals can be found in the care plan section) Acute Rehab OT Goals Patient Stated Goal: none stated OT Goal Formulation: With patient Time For Goal Achievement: 07/03/15 Potential to Achieve Goals: Good ADL  Goals Pt Will Perform Grooming: with supervision;standing Pt Will Transfer to Toilet: with supervision;regular height toilet;ambulating Additional ADL Goal #1: pt will complete ADL with set up/supervision, sit to stand  OT Frequency: Min 2X/week   Barriers to D/C:            Co-evaluation              End of Session Nurse Communication:  (neck collar)  Activity Tolerance: Patient tolerated treatment well Patient left: in bed;with call bell/phone within reach;with bed alarm set   Time: 2956-2130 OT Time Calculation (min): 24 min Charges:  OT General Charges $OT Visit: 1 Procedure OT Evaluation $Initial OT Evaluation Tier I: 1 Procedure G-Codes: OT G-codes **NOT FOR INPATIENT CLASS** Functional Assessment Tool Used: clinical observation and judgment Functional Limitation: Self care Self Care Current Status (Q6578): At least 20 percent but less than 40 percent impaired, limited or restricted Self Care Goal Status (I6962): At least 1 percent but less than 20 percent impaired, limited or restricted  [facility] 06/26/2015, 10:10 AM Marica Otter, OTR/L [phone] 06/26/2015

## 2015-06-26 NOTE — Care Management Note (Signed)
Case Management Note  Patient Details  Name: Crystal [name] MRN: [id number] Date of Birth: 1950/08/28  Subjective/Objective:  [facility] for [facility]. [name] confirmed insurance-patient has co pay without HHRN of $22 per visit for HHPT/HHOT.  HHRN added for-med instruction. Noted d/c in am if stable.                 Action/Plan:d/c plan home w/HHC.   Expected Discharge Date:                  Expected Discharge Plan:  Home w Home Health Services  In-House Referral:     Discharge planning Services     Post Acute Care Choice:    Choice offered to:  Spouse  DME Arranged:    DME Agency:     HH Arranged:  PT, OT, RN HH Agency:  [facility]  Status of Service:  In process, will continue to follow  Medicare Important Message Given:    Date Medicare IM Given:    Medicare IM give by:    Date Additional Medicare IM Given:    Additional Medicare Important Message give by:     If discussed at Long Length of Stay Meetings, dates discussed:    Additional Comments:  Lanier Clam, RN 06/26/2015, 2:54 PM

## 2015-06-27 LAB — GLUCOSE, CAPILLARY: Glucose-Capillary: 135 mg/dL — ABNORMAL HIGH (ref 65–99)

## 2015-06-27 NOTE — Care Management Obs Status (Signed)
MEDICARE OBSERVATION STATUS NOTIFICATION   Patient Details  Name: SHAMYRA KINSLER MRN: [id number] Date of Birth: [birth year]   Medicare Observation Status Notification Given:  Yes    Alexis Goodell, RN 06/27/2015, 10:50 AM

## 2015-06-27 NOTE — Progress Notes (Signed)
   06/25/15 1211  PT Time Calculation  PT Start Time (ACUTE ONLY) 1139  PT Stop Time (ACUTE ONLY) 1203  PT Time Calculation (min) (ACUTE ONLY) 24 min  PT G-Codes **NOT FOR INPATIENT CLASS**  Functional Assessment Tool Used clinical judgement  Functional Limitation Mobility: Walking and moving around  Mobility: Walking and Moving Around Current Status (Z6109) CJ  Mobility: Walking and Moving Around Goal Status (U0454) CI  PT General Charges  $$ ACUTE PT VISIT 1 Procedure  PT Evaluation  $Initial PT Evaluation Tier I 1 Procedure   Crystal [name], MPT [phone]

## 2015-06-27 NOTE — Care Management Note (Signed)
Case Management Note  Patient Details  Name: [name] MRN: [id number] Date of Birth: 1951-03-27  Subjective/Objective: [facility]. [name] confirmed insurance-patient has co pay without HHRN of $22 per visit for HHPT/HHOT. HHRN added for-med instruction. Noted d/c in am if stable.    Action/Plan:d/c plan home w/HHC.   Expected Discharge Date:                  Expected Discharge Plan:  Home w Home Health Services  In-House Referral:  NA  Discharge planning Services  CM Consult  Post Acute Care Choice:  Home Health Choice offered to:  Spouse  DME Arranged:  N/A DME Agency:  NA  HH Arranged:  PT, OT, RN, Nurse's Aide HH Agency:  [facility]  Status of Service:  Completed, signed off  Medicare Important Message Given:    Date Medicare IM Given:    Medicare IM give by:    Date Additional Medicare IM Given:    Additional Medicare Important Message give by:     If discussed at Long Length of Stay Meetings, dates discussed:    Additional Comments:  Alexis Goodell, RN 06/27/2015, 11:17 AM

## 2015-06-27 NOTE — Progress Notes (Signed)
Stable, no changes from DC summary yesterday

## 2015-07-02 DIAGNOSIS — Z4789 Encounter for other orthopedic aftercare: Secondary | ICD-10-CM

## 2015-07-02 DIAGNOSIS — F329 Major depressive disorder, single episode, unspecified: Secondary | ICD-10-CM | POA: Diagnosis not present

## 2015-07-02 DIAGNOSIS — I2510 Atherosclerotic heart disease of native coronary artery without angina pectoris: Secondary | ICD-10-CM | POA: Diagnosis not present

## 2015-07-02 DIAGNOSIS — E119 Type 2 diabetes mellitus without complications: Secondary | ICD-10-CM | POA: Diagnosis not present

## 2015-07-05 ENCOUNTER — Ambulatory Visit (INDEPENDENT_AMBULATORY_CARE_PROVIDER_SITE_OTHER): Payer: Medicare Other | Admitting: Internal Medicine

## 2015-07-05 DIAGNOSIS — G8918 Other acute postprocedural pain: Secondary | ICD-10-CM | POA: Diagnosis not present

## 2015-07-05 DIAGNOSIS — Z981 Arthrodesis status: Secondary | ICD-10-CM | POA: Diagnosis not present

## 2015-07-05 DIAGNOSIS — E860 Dehydration: Secondary | ICD-10-CM | POA: Diagnosis not present

## 2015-07-05 NOTE — Progress Notes (Signed)
   Subjective:    Patient ID: Crystal [name], female    DOB: 02/28/1951, 65 y.o.   MRN: [id number]  HPI  Patient presents with her husband for OV after hospitalization after neck surgery on 06/13/15. She reports that she doesn't really remember much since her surgery until her admission to the hospital.  She was overmedicated and found to be hypoxic and also hypersomnolent.  She reports that since she has been home she has been doing a lot better. She reports that has been a lot more awake and she has been eating and drinking well. She has followed up with Dr. Venetia Maxon this week and he is happy with her healing and reports.  She reports that she is seeing Dr. Ollen Bowl in the Neurosurgery office for pain management at this time.  She reports that she is no longer going to go to [facility].  She would like 1 person to manage all her pain medications.    She feels that her breathing is doing well.  She is not using supplemental O2.  She is satting 94% on RA.  She is getting 95% O2 after walking greater than 100 ft.  She does not qualify for supplemental O2.  She is supposed to be using CPAP at nightime.     Review of Systems  Constitutional: Negative for fever, chills and fatigue.  Respiratory: Negative for cough, chest tightness, shortness of breath and wheezing.   Cardiovascular: Negative for chest pain and palpitations.  Gastrointestinal: Positive for constipation. Negative for nausea, vomiting, abdominal pain and diarrhea.  Genitourinary: Negative for dysuria, urgency, frequency, hematuria and difficulty urinating.  Neurological: Negative for dizziness, weakness and light-headedness.  Psychiatric/Behavioral: Negative for suicidal ideas, dysphoric mood and agitation. The patient is not nervous/anxious.        Objective:   Physical Exam  Constitutional: She is oriented to person, place, and time. She appears well-developed and well-nourished. No distress. Cervical collar in place.     HENT:  Head: Normocephalic.  Mouth/Throat: Oropharynx is clear and moist. No oropharyngeal exudate.  Eyes: Conjunctivae are normal. No scleral icterus.  Neck: Normal range of motion. Neck supple. No JVD present. No thyromegaly present.  Cardiovascular: Normal rate, regular rhythm, normal heart sounds and intact distal pulses.  Exam reveals no gallop and no friction rub.   No murmur heard. Pulmonary/Chest: Effort normal and breath sounds normal. No respiratory distress. She has no wheezes. She has no rales. She exhibits no tenderness.  Abdominal: Soft. Bowel sounds are normal. She exhibits no distension and no mass. There is no tenderness. There is no rebound and no guarding.  Musculoskeletal: Normal range of motion.  Lymphadenopathy:    She has no cervical adenopathy.  Neurological: She is alert and oriented to person, place, and time.  Skin: Skin is warm and dry. She is not diaphoretic.  Psychiatric: She has a normal mood and affect. Her behavior is normal. Judgment and thought content normal.  Nursing note and vitals reviewed.          Assessment & Plan:    1. Dehydration -encouraged 4-5 water bottles per day -appears to be well hydrated on exam - Comprehensive metabolic panel  2. Post-op pain -appears to now be well controlled -recommend that all further pain medications narcotic and non-narcotic be handled by pain management -PT  3. S/P cervical spinal fusion -cont to follow with Dr. Venetia Maxon -continuous C-collar for 2-3 more weeks

## 2015-07-06 LAB — COMPREHENSIVE METABOLIC PANEL
ALT: 14 U/L (ref 6–29)
AST: 14 U/L (ref 10–35)
Albumin: 4.2 g/dL (ref 3.6–5.1)
Alkaline Phosphatase: 54 U/L (ref 33–130)
BUN: 13 mg/dL (ref 7–25)
CO2: 28 mmol/L (ref 20–31)
Calcium: 10.2 mg/dL (ref 8.6–10.4)
Chloride: 100 mmol/L (ref 98–110)
Creat: 0.88 mg/dL (ref 0.50–0.99)
Glucose, Bld: 116 mg/dL — ABNORMAL HIGH (ref 65–99)
Potassium: 4 mmol/L (ref 3.5–5.3)
Sodium: 142 mmol/L (ref 135–146)
Total Bilirubin: 0.4 mg/dL (ref 0.2–1.2)
Total Protein: 6.8 g/dL (ref 6.1–8.1)

## 2015-07-13 ENCOUNTER — Encounter: Payer: Self-pay | Admitting: Internal Medicine

## 2015-08-01 ENCOUNTER — Institutional Professional Consult (permissible substitution): Payer: Medicare Other | Admitting: Neurology

## 2015-08-02 ENCOUNTER — Telehealth: Payer: Self-pay | Admitting: *Deleted

## 2015-08-02 NOTE — Telephone Encounter (Signed)
Patient called and states she has sores on her tongue.  Per Dr Oneta Rack, try 1/2 teaspoon vinegar in 1 cup of water, rinse her mouth and hold the solution in her mouth x 1 minute 3-4 times a day.  Patient is aware.

## 2015-08-06 ENCOUNTER — Institutional Professional Consult (permissible substitution): Payer: Medicare Other | Admitting: Neurology

## 2015-08-06 ENCOUNTER — Telehealth: Payer: Self-pay

## 2015-08-06 NOTE — Telephone Encounter (Signed)
Patient did not show to new sleep consult appt today  

## 2015-08-08 ENCOUNTER — Encounter: Payer: Self-pay | Admitting: Neurology

## 2015-08-09 ENCOUNTER — Encounter: Payer: Self-pay | Admitting: Internal Medicine

## 2015-08-09 ENCOUNTER — Ambulatory Visit (INDEPENDENT_AMBULATORY_CARE_PROVIDER_SITE_OTHER): Payer: Medicare Other | Admitting: Internal Medicine

## 2015-08-09 VITALS — BP 108/72 | HR 80 | Temp 97.5°F | Resp 16 | Ht 63.25 in | Wt 171.8 lb

## 2015-08-09 DIAGNOSIS — G2581 Restless legs syndrome: Secondary | ICD-10-CM

## 2015-08-09 DIAGNOSIS — Z0000 Encounter for general adult medical examination without abnormal findings: Secondary | ICD-10-CM

## 2015-08-09 DIAGNOSIS — I10 Essential (primary) hypertension: Secondary | ICD-10-CM | POA: Diagnosis not present

## 2015-08-09 DIAGNOSIS — K589 Irritable bowel syndrome without diarrhea: Secondary | ICD-10-CM

## 2015-08-09 DIAGNOSIS — E785 Hyperlipidemia, unspecified: Secondary | ICD-10-CM

## 2015-08-09 DIAGNOSIS — E039 Hypothyroidism, unspecified: Secondary | ICD-10-CM

## 2015-08-09 DIAGNOSIS — E119 Type 2 diabetes mellitus without complications: Secondary | ICD-10-CM

## 2015-08-09 DIAGNOSIS — F329 Major depressive disorder, single episode, unspecified: Secondary | ICD-10-CM

## 2015-08-09 DIAGNOSIS — Z79899 Other long term (current) drug therapy: Secondary | ICD-10-CM

## 2015-08-09 DIAGNOSIS — F32A Depression, unspecified: Secondary | ICD-10-CM

## 2015-08-09 DIAGNOSIS — Z1212 Encounter for screening for malignant neoplasm of rectum: Secondary | ICD-10-CM

## 2015-08-09 DIAGNOSIS — K219 Gastro-esophageal reflux disease without esophagitis: Secondary | ICD-10-CM

## 2015-08-09 DIAGNOSIS — Z0001 Encounter for general adult medical examination with abnormal findings: Secondary | ICD-10-CM

## 2015-08-09 DIAGNOSIS — E559 Vitamin D deficiency, unspecified: Secondary | ICD-10-CM

## 2015-08-09 DIAGNOSIS — Z1331 Encounter for screening for depression: Secondary | ICD-10-CM

## 2015-08-09 DIAGNOSIS — D509 Iron deficiency anemia, unspecified: Secondary | ICD-10-CM

## 2015-08-09 DIAGNOSIS — Z1389 Encounter for screening for other disorder: Secondary | ICD-10-CM

## 2015-08-09 LAB — HEPATIC FUNCTION PANEL
ALT: 11 U/L (ref 6–29)
AST: 17 U/L (ref 10–35)
Albumin: 4.4 g/dL (ref 3.6–5.1)
Alkaline Phosphatase: 47 U/L (ref 33–130)
Bilirubin, Direct: 0.1 mg/dL (ref <=0.2)
Indirect Bilirubin: 0.4 mg/dL (ref 0.2–1.2)
Total Bilirubin: 0.5 mg/dL (ref 0.2–1.2)
Total Protein: 7.3 g/dL (ref 6.1–8.1)

## 2015-08-09 LAB — CBC WITH DIFFERENTIAL/PLATELET
Basophils Absolute: 0.1 10*3/uL (ref 0.0–0.1)
Basophils Relative: 1 % (ref 0–1)
Eosinophils Absolute: 0.2 10*3/uL (ref 0.0–0.7)
Eosinophils Relative: 2 % (ref 0–5)
HCT: 44.6 % (ref 36.0–46.0)
Hemoglobin: 14.9 g/dL (ref 12.0–15.0)
Lymphocytes Relative: 16 % (ref 12–46)
Lymphs Abs: 1.6 10*3/uL (ref 0.7–4.0)
MCH: 29.9 pg (ref 26.0–34.0)
MCHC: 33.4 g/dL (ref 30.0–36.0)
MCV: 89.6 fL (ref 78.0–100.0)
MPV: 9.8 fL (ref 8.6–12.4)
Monocytes Absolute: 0.6 10*3/uL (ref 0.1–1.0)
Monocytes Relative: 6 % (ref 3–12)
Neutro Abs: 7.6 10*3/uL (ref 1.7–7.7)
Neutrophils Relative %: 75 % (ref 43–77)
Platelets: 246 10*3/uL (ref 150–400)
RBC: 4.98 MIL/uL (ref 3.87–5.11)
RDW: 14.6 % (ref 11.5–15.5)
WBC: 10.1 10*3/uL (ref 4.0–10.5)

## 2015-08-09 LAB — LIPID PANEL
Cholesterol: 202 mg/dL — ABNORMAL HIGH (ref 125–200)
HDL: 37 mg/dL — ABNORMAL LOW (ref >=46)
LDL Cholesterol: 102 mg/dL (ref <130)
Total CHOL/HDL Ratio: 5.5 Ratio — ABNORMAL HIGH (ref <=5.0)
Triglycerides: 317 mg/dL — ABNORMAL HIGH (ref <150)
VLDL: 63 mg/dL — ABNORMAL HIGH (ref <30)

## 2015-08-09 LAB — BASIC METABOLIC PANEL WITH GFR
BUN: 16 mg/dL (ref 7–25)
CO2: 28 mmol/L (ref 20–31)
Calcium: 9.8 mg/dL (ref 8.6–10.4)
Chloride: 100 mmol/L (ref 98–110)
Creat: 0.87 mg/dL (ref 0.50–0.99)
GFR, Est African American: 80 mL/min (ref >=60)
GFR, Est Non African American: 70 mL/min (ref >=60)
Glucose, Bld: 99 mg/dL (ref 65–99)
Potassium: 4 mmol/L (ref 3.5–5.3)
Sodium: 140 mmol/L (ref 135–146)

## 2015-08-09 LAB — TSH: TSH: 2.22 mIU/L

## 2015-08-09 LAB — MAGNESIUM: Magnesium: 2 mg/dL (ref 1.5–2.5)

## 2015-08-09 MED ORDER — ROPINIROLE HCL 2 MG PO TABS: ORAL_TABLET | ORAL | Status: DC

## 2015-08-09 NOTE — Progress Notes (Signed)
Patient ID: Crystal [name], female   DOB: 1951/05/31, 65 y.o.   MRN: [id number]  Annual Screening/Preventative Visit And Comprehensive Evaluation &  Examination  This very nice 65 y.o. [name] presents for presents for a Wellness/Preventative Visit & comprehensive evaluation and management of multiple medical co-morbidities.  Patient has been followed for HTN, T2_NIDDM, Hyperlipidemia and Vitamin D Deficiency. Patient has multiple other co-morbidities including GERD, Crohn's Dz, RLS, Hypothyroidism, OSA on BiPAP and Chronic Pain from  DDD - now post a recent L ACDF on Jun 13, 2015 by Dr Venetia Maxon for Cx Spinal Stenosis and had hardware replaced. By count, patient has has a total of # 22 surgeries and procedures thus far.     HTN predates since  2006. Patient's BP has been controlled at home and patient denies any cardiac symptoms as chest pain, palpitations, shortness of breath, dizziness or ankle swelling. Today's BP: 108/72 mmHg    Patient's hyperlipidemia is controlled with diet and medications. Patient denies myalgias or other medication SE's. Last lipids were  Cholesterol 149; HDL 35*; LDL 82; Triglycerides 158 on 05/08/2015.   Patient has T2_NIDDM on diet  predating since Oct 2012 and patient denies reactive hypoglycemic symptoms, visual blurring, diabetic polys, or paresthesias. Last A1c was not at goal with A1c 6.7% on 06/24/2015.   Finally, patient has history of Vitamin D Deficiency of "59" in 2008 and last Vitamin D was 72 on 05/08/2015.    Medication Sig  . aspirin EC 81 MG  Take 81 mg by mouth daily.  . bisoprolol-hctz (ZIAC) 10-6.25  TAKE ONE TABLET BY MOUTH ONE TIME DAILY FOR BLOOD PRESSURE  . buPROPion-XL 300 MG - Take 300 mg by mouth daily.  . cyclobenzaprine 5 MG tablet Take 1 tablet (5 mg total) by mouth 2 (two) times daily as needed for muscle spasms.  . DEXILANT 60 MG Take 1 capsule (60 mg total) by mouth daily.  . DULoxetine 60 MG  TAKE 1 CAPSULE BY MOUTH DAILY.  . fenofibrate  134  MG  TAKE 1 CAPSULE BY MOUTH DAILY BEFORE BREAKFAST  . FLONASE nasal spray USE TWO SPRAYS IN EACH NOSTRIL DAILY AS NEEDED  . gabapentin 300 MG  Take 1 capsule (300 mg total) by mouth 3 (three) times daily.  [name] levothyroxine 100 MCG  TAKE 1 TABLET BY MOUTH ON TUES, WED, THURS, SAT AND SUN. TAKE 1& 1/2 TABLET ON MON AND FRI  . AMITIZA 24 MCG  Take 24 mcg by mouth 2 (two) times daily with a meal.  .  (CORTISPORIN otic soln 6  - 8 drops to affected ear 4 x daily for Otitis Externa  . oxybutynin  5 MG take 1 tablet two times a day  . MIRALAX   Take 17 g by mouth daily as needed for mild constipation.  . ranitidine (ZANTAC) 150 MG tablet Take 150 mg by mouth at bedtime.  . Vitamin D 02637 UNITS C Take 50,000 Units by mouth 2 (two) times a week. MON & FRI)   Allergies  Allergen Reactions  . Atorvastatin Other (See Comments)    Extreme pain  . Codeine Other (See Comments)    Headache  . Pravastatin Other (See Comments)    Exreme pain  . Statins Other (See Comments)    Extreme pain  . Amitriptyline Other (See Comments)    Unspecified  . Fish Oil Nausea Only  . Linzess [Linaclotide] Other (See Comments)    Unspecified  . Nuvigil [Armodafinil] Other (See Comments)  Unspecified  . Erythromycin Other (See Comments)    Reaction unspecified  . Erythromycin Base Rash  . Flax Seed [Bio-Flax] Rash    Linseed  . Hydrocodone-Acetaminophen Rash  . Morphine Nausea And Vomiting and Rash  . Nsaids Nausea Only  . Sertraline Rash  . Sinemet [Carbidopa W-Levodopa] Rash  . Sulfamethoxazole Rash   Past Medical History  Diagnosis Date  . Restless leg syndrome   . Fibromyalgia   . Rhinitis 06/25/2010  . Gait disorder 10/26/2012  . Hyperlipidemia   . Hypothyroid   . Vitamin D deficiency   . ASCVD (arteriosclerotic cardiovascular disease)   . Hypertension   . TIA (transient ischemic attack)     3        . Arthritis   . GERD (gastroesophageal reflux disease)   . GI bleed   . Sleep apnea   .  Depression    Health Maintenance  Topic Date Due  . FOOT EXAM  09/26/1958  . OPHTHALMOLOGY EXAM  09/26/1958  . TETANUS/TDAP  06/30/2014  . URINE MICROALBUMIN  07/12/2015  . HEMOGLOBIN A1C  12/23/2015  . INFLUENZA VACCINE  01/29/2016  . PNA vac Low Risk Adult (2 of 2 - PPSV23) 04/16/2016  . MAMMOGRAM  11/23/2016  . COLONOSCOPY  05/16/2018  . DEXA SCAN  Completed  . ZOSTAVAX  Completed  . Hepatitis C Screening  Completed   Immunization History  Administered Date(s) Administered  . DT 01/03/2015  . Influenza Whole 03/30/2010  . Influenza, High Dose Seasonal PF 03/13/2015  . Influenza-Unspecified 03/31/2011, 05/14/2014  . Pneumococcal Conjugate-13 04/17/2015  . Pneumococcal-Unspecified 06/30/2005  . Td 06/30/2004  . Zoster 07/01/2007   Past Surgical History  Procedure Laterality Date  . Knee susrgery    . Shoulder adhesion release    . Spine surgery    . Appendectomy  1960  . Cesarean section  1983 & 1886  . Abdominal hysterectomy  1992  . Rotator cuff repair Left 2005    2 LEFT  1  RIGHT  . Tonsillectomy      T+A  . Tonsillectomy  1960  . Joint replacement      2   LEFT  1 RIGHT   . Toe surgery      LEFT       . Toe surgery      RIGHT  BIG TOE  . Anterior cervical decompression/discectomy fusion 4 levels N/A 06/14/2015    Procedure: Cervical three-four Cervical four-five Cervical five- six Cervical six- seven  Anterior cervical decompression/diskectomy/fusion;  Surgeon: Maeola Harman, MD;  Location: [facility];  Service: Neurosurgery;  Laterality: N/A;  C3-4 C4-5 C5-6 C6-7 Anterior cervical decompression/diskectomy/fusion   Family History  Problem Relation Age of Onset  . Cancer Sister     uterus  . Depression Sister   . Heart disease Father   . Hypertension Father   . Hyperlipidemia Father   . Cancer Mother   . Diabetes Mother   . Osteoporosis Mother   . Leukemia Mother   . Leukemia Sister   . Lupus Sister    Social History  Substance Use Topics  .  Smoking status: Never Smoker   . Smokeless tobacco: None  . Alcohol Use: 0.0 oz/week    0 Standard drinks or equivalent per week     Comment: rarely    ROS Constitutional: Denies fever, chills, weight loss/gain, headaches, insomnia,  night sweats, and change in appetite. Does c/o fatigue. Eyes: Denies redness, blurred vision, diplopia, discharge,  itchy, watery eyes.  ENT: Denies discharge, congestion, post nasal drip, epistaxis, sore throat, earache, hearing loss, dental pain, Tinnitus, Vertigo, Sinus pain, snoring.  Cardio: Denies chest pain, palpitations, irregular heartbeat, syncope, dyspnea, diaphoresis, orthopnea, PND, claudication, edema Respiratory: denies cough, dyspnea, DOE, pleurisy, hoarseness, laryngitis, wheezing.  Gastrointestinal: Denies dysphagia, heartburn, reflux, water brash, pain, cramps, nausea, vomiting, bloating, diarrhea, constipation, hematemesis, melena, hematochezia, jaundice, hemorrhoids Genitourinary: Denies dysuria, frequency, urgency, nocturia, hesitancy, discharge, hematuria, flank pain Breast: Breast lumps, nipple discharge, bleeding.  Musculoskeletal: Denies arthralgia, myalgia, stiffness, Jt. Swelling, pain, limp, and strain/sprain. Denies falls. Skin: Denies puritis, rash, hives, warts, acne, eczema, changing in skin lesion Neuro: No weakness, tremor, incoordination, spasms, paresthesia, pain Psychiatric: Denies confusion, memory loss, sensory loss. Denies Depression. Endocrine: Denies change in weight, skin, hair change, nocturia, and paresthesia, diabetic polys, visual blurring, hyper / hypo glycemic episodes.  Heme/Lymph: No excessive bleeding, bruising, enlarged lymph nodes.  Physical Exam  BP 108/72 mmHg  Pulse 80  Temp(Src) 97.5 F (36.4 C)  Resp 16  Ht 5' 3.25" (1.607 m)  Wt 171 lb 12.8 oz (77.928 kg)  BMI 30.18 kg/m2  General Appearance: Well nourished and in no apparent distress. Eyes: PERRLA, EOMs, conjunctiva no swelling or erythema,  normal fundi and vessels. Mild L peripheral hemifacial paresis (from old Bell's palsy)  Sinuses: No frontal/maxillary tenderness ENT/Mouth: EACs patent / TMs  nl. Nares clear without erythema, swelling, mucoid exudates. Oral hygiene is good. No erythema, swelling, or exudate. Tongue normal, non-obstructing. Tonsils not swollen or erythematous. Hearing normal.  Neck: Supple, thyroid normal. No bruits, nodes or JVD. Respiratory: Respiratory effort normal.  BS equal and clear bilateral without rales, rhonci, wheezing or stridor. Cardio: Heart sounds are normal with regular rate and rhythm and no murmurs, rubs or gallops. Peripheral pulses are normal and equal bilaterally without edema. No aortic or femoral bruits. Chest: symmetric with normal excursions and percussion. Breasts: Symmetric, without lumps, nipple discharge, retractions, or fibrocystic changes.  Abdomen: Flat, soft, with bowl sounds. Nontender, no guarding, rebound, hernias, masses, or organomegaly.  Lymphatics: Non tender without lymphadenopathy.  Genitourinary:  Musculoskeletal: Full ROM all peripheral extremities, joint stability, 5/5 strength, and normal gait. Skin: Warm and dry without rashes, lesions, cyanosis, clubbing or  ecchymosis.  Neuro: Cranial nerves intact, reflexes equal bilaterally. Normal muscle tone, no cerebellar symptoms. Sensation intact.  Pysch: Alert and oriented X 3, normal affect, Insight and Judgment appropriate.   Assessment and Plan  1. Annual Preventative Screening Examination   2. Essential hypertension  - Microalbumin / creatinine urine ratio - EKG 12-Lead - [place], RETROPERITNL ABD,  LTD - TSH  3. Hyperlipidemia  - Lipid panel - TSH  4. T2_NIDDM  - Hemoglobin A1c - Insulin, random  5. Vitamin D deficiency  - VITAMIN D 25 Hydroxy   6. Hypothyroidism, unspecified hypothyroidism type   7. Gastroesophageal reflux disease   8. IBS (irritable colon syndrome)   9. Depression,  controlled   10. Screening for rectal cancer  - POC Hemoccult Bld/Stl   11. Medication management  - Urinalysis, Routine w reflex microscopic  - CBC with Differential/Platelet - BASIC METABOLIC PANEL WITH GFR - Hepatic function panel - Magnesium  12. Anemia, iron deficiency  - Iron and TIBC  13. RLS (restless legs syndrome)  - rOPINIRole (REQUIP) 2 MG tablet; Take 1 tablet 4 x day for Restless Leg Syndrome  Dispense: 360 tablet; Refill: 1 - Iron Panel    Continue prudent diet as discussed, weight control, BP monitoring,  regular exercise, and medications. Discussed med's effects and SE's. Screening labs and tests as requested with regular follow-up as recommended. Over 40 minutes of exam, counseling, chart review and high complex critical decision making was performed.

## 2015-08-09 NOTE — Patient Instructions (Signed)

## 2015-08-10 LAB — MICROALBUMIN / CREATININE URINE RATIO
Creatinine, Urine: 121 mg/dL (ref 20–320)
Microalb Creat Ratio: 5 mcg/mg creat (ref <30)
Microalb, Ur: 0.6 mg/dL

## 2015-08-10 LAB — URINALYSIS, ROUTINE W REFLEX MICROSCOPIC
Bilirubin Urine: NEGATIVE
Glucose, UA: NEGATIVE
Hgb urine dipstick: NEGATIVE
Ketones, ur: NEGATIVE
Nitrite: NEGATIVE
Protein, ur: NEGATIVE
Specific Gravity, Urine: 1.022 (ref 1.001–1.035)
pH: 6 (ref 5.0–8.0)

## 2015-08-10 LAB — HEMOGLOBIN A1C
Hgb A1c MFr Bld: 6.2 % — ABNORMAL HIGH (ref <5.7)
Mean Plasma Glucose: 131 mg/dL — ABNORMAL HIGH (ref <117)

## 2015-08-10 LAB — URINALYSIS, MICROSCOPIC ONLY
Bacteria, UA: NONE SEEN [HPF]
Yeast: NONE SEEN [HPF]

## 2015-08-10 LAB — INSULIN, RANDOM: Insulin: 2.7 u[IU]/mL (ref 2.0–19.6)

## 2015-08-10 LAB — VITAMIN D 25 HYDROXY (VIT D DEFICIENCY, FRACTURES): Vit D, 25-Hydroxy: 92 ng/mL (ref 30–100)

## 2015-08-15 ENCOUNTER — Other Ambulatory Visit: Payer: Self-pay | Admitting: Internal Medicine

## 2015-08-27 ENCOUNTER — Telehealth: Payer: Self-pay

## 2015-08-27 NOTE — Telephone Encounter (Signed)
I spoke to patient about bringing in CPAP to appt. Patient has not used in about a year and [facility] machine to [facility]>

## 2015-08-28 ENCOUNTER — Encounter: Payer: Self-pay | Admitting: Neurology

## 2015-08-28 ENCOUNTER — Ambulatory Visit (INDEPENDENT_AMBULATORY_CARE_PROVIDER_SITE_OTHER): Payer: Medicare Other | Admitting: Neurology

## 2015-08-28 VITALS — BP 112/78 | HR 78 | Resp 16 | Ht 65.5 in | Wt 172.0 lb

## 2015-08-28 DIAGNOSIS — Z9889 Other specified postprocedural states: Secondary | ICD-10-CM

## 2015-08-28 DIAGNOSIS — G510 Bell's palsy: Secondary | ICD-10-CM | POA: Diagnosis not present

## 2015-08-28 DIAGNOSIS — G4761 Periodic limb movement disorder: Secondary | ICD-10-CM

## 2015-08-28 DIAGNOSIS — G2581 Restless legs syndrome: Secondary | ICD-10-CM | POA: Diagnosis not present

## 2015-08-28 DIAGNOSIS — G4733 Obstructive sleep apnea (adult) (pediatric): Secondary | ICD-10-CM

## 2015-08-28 NOTE — Patient Instructions (Addendum)
Based on your symptoms and your exam I believe you may still be at risk for obstructive sleep apnea or OSA, and I think we should proceed with a sleep study to determine whether you do or do not have OSA and how severe it is. If you have more than mild OSA, I want you to consider treatment with CPAP. Please remember, the risks and ramifications of moderate to severe obstructive sleep apnea or OSA are: Cardiovascular disease, including congestive heart failure, stroke, difficult to control hypertension, arrhythmias, and even type 2 diabetes has been linked to untreated OSA. Sleep apnea causes disruption of sleep and sleep deprivation in most cases, which, in turn, can cause recurrent headaches, problems with memory, mood, concentration, focus, and vigilance. Most people with untreated sleep apnea report excessive daytime sleepiness, which can affect their ability to drive. Please do not drive if you feel sleepy.   I will likely see you back after your sleep study to go over the test results and where to go from there. We will call you after your sleep study to advise about the results (most likely, you will hear from Diana, my nurse) and to set up an appointment at the time, as necessary.    Our sleep lab administrative assistant, Dawn will meet with you or call you to schedule your sleep study. If you don't hear back from her by next week please feel free to call her at [phone]. This is her direct line and please leave a message with your phone number to call back if you get the voicemail box. She will call back as soon as possible.   

## 2015-08-28 NOTE — Progress Notes (Signed)
Subjective:    Patient ID: Crystal [name] is a 65 y.o. female.  HPI     Interim history:    Crystal [name] is a 65 year old left-handed woman with a complex underlying medical history of hypertension, hyperlipidemia, Crohn's disease, hypothyroidism, obstructive sleep apnea, right-sided TIA in March 2005 (left-sided weakness and numbness in the face lasting 40 minutes), depression, and a history of right Bell's palsy, chronic back pain, fibromyalgia, RLS, interstitial cystitis and cervical and lumbar degenerative disc disease (s/p spine surgery of the lumbar spine under Dr. Vertell Limber), who presents for follow-up consultation of her obstructive sleep apnea. She is re-referred by her primary care provider, Crystal [name], for reevaluation of her prior diagnosis of OSA. The patient is unaccompanied today. Of note, she no showed for an appointment on 08/06/2015. I last saw her on 12/06/2014, at which time we talked about her BiPAP compliance. She was suboptimally compliant and was not able to tolerate it fully. She was advised to talk to her dentist about a dental appliance for potential treatment of her sleep apnea. She had previously been diagnosed with obstructive sleep apnea and placed on BiPAP therapy. She was diagnosed and placed on treatment before I first met her. She was also advised to talk to her ear nose throat physician about surgical options for sleep apnea treatment.  Today, 08/28/2015: She reports difficulty with her sleep including sleep maintenance, she has some snoring. Her Epworth sleepiness score is 13 out of 24, her fatigue score is 57 out of 63. Of note, in the interim, she had ACDF under Dr. Vertell Limber on 06/14/2015. She went home, but was in pain had AMS. She had medication changes for pain. She then presented to the emergency room on 06/24/2015 for pain. She was admitted and was in the hospital from 06/24/15 and 06/26/15. I reviewed the ER records and hospital records including discharge  summary. CT cervical spine showed stable findings. She was felt to be overmedicated and some medications were adjusted and changed. She started seeing Dr. Maryjean Ka. She takes no narcotic pain medication. She takes gabapentin 300 mg tid, and Cymbalta and Linzess. She is taking ropinirole 2 mg bid, 5 PM and BT, which is between 10 to 11 PM. She fell onto her right arm recently and had a bruise on her right arm. She is able to walk without a cane at this time. She overall is pleased with how her neck surgery has helped her. She has problems with both shoulders and may need shoulder surgeries in the near future. She has cataracts and has upcoming left-sided cataract surgery later in March 2017.  Previously:   I had previously seen her for her gait disorder. I saw her on 08/07/2014, at which time she admitted that she was not fully compliant with her BiPAP. She had tolerance issues with the mask. I prescribed a new nose mask. She was also complaining of shoulder pain. I suggested that she continue using her BiPAP regularly and provided her with an order for new supplies.   I reviewed her BiPAP compliance data from 07/08/2014 through 08/06/2014 which is a total of 30 days during which time she used her machine only 16 days with percent used days greater than 4 hours at only 47%, indicating suboptimal compliance with an average usage for all days of only 2 hours and 44 minutes, residual AHI low at 0.8, leak acceptable. Pressure setting of maximum IPAP of 19 cm, minimum EPAP of 5 cm and pressure support of 4  cm.   I saw her on 09/13/2013, at which time she reported no further TIA type symptoms. She had a stable gait. I suggested a prn follow-up. She requested to establish care for her obstructive sleep apnea as she no longer has a sleep specialist.   I reviewed today her compliance data from 05/09/2014 through 08/06/2014 which is a total of 90 days during which time she used her machine only 39 days with percent  used days greater than 4 hours of 37%, indicating poor compliance. She is on AutoBiPAP with a maximum IPAP of 19, minimum EPAP of 5, pressure support of 4. Residual AHI is low at 0.9 per hour, leak acceptable with the 95th percentile at 13.3 L/m. Average usage for all days of only 2 hours and 16 minutes.   I saw her on 04/28/2013, at which time I felt her physical exam was stable and she was advised to get started on baby aspirin for possible recent TIA with transient right eye vision loss. I requested echocardiogram, carotid Doppler study and repeat brain MRI and referred her to ENT. She was encouraged to discuss her stress and depression symptoms with her primary care physician. She was also encouraged to restart psychotherapy. We talked about secondary stroke prevention and her multifactorial gait disorder. Her carotid Doppler study from 05/24/2013 was unremarkable. Her brain MRI without contrast from 05/11/2013 was reported as normal without change from January 2014. In addition I personally reviewed the images through the PACS system. Her echocardiogram from 05/16/2013 showed an EF of 50-55%, mild mitral regurgitation, trivial tricuspid regurgitation, mild left atrial dilatation, and otherwise normal findings. We called her with all her test results.   I first met her on 10/26/2012, and which time I felt she had tremors as well as a multifactorial gait disorder and I suggested no new medications at the time but reinforced the need for treatment of her sleep apnea and we talked about general cardiovascular risk reduction. In 9/14, she had sudden onset of R eye vision loss for a about 40 minutes. She saw an eye doctor, and was told she may have glaucoma, and she has astigmatism. She has had orthostatic hypotension before. She endorsed stress and lost her son in 2010-09-03, her mother died in 2011-09-04, and her father died on 11-May-2013. She was tearful. She had counseling in the past. She had PT per ortho and per PCP. She  has had vertigo.   She has previously been seen by Dr. Erling Cruz and was last seen by him on 07/05/2012 for her history of balance difficulties. At the time of her last visit he suggested a repeat MRI and MRA of the brain. MRI and MRA of the brain did not show any acute or new abnormalities and changes were not much progressed from 2003/09/04.   She has had bilateral hand tremors and a family history of tremors and degenerative disc disease as well as obstructive sleep apnea on BiPAP and was on CPAP in the past, and is status post left knee surgery in 09-03-08.   She fell in March 3/14 and went to [facility] and was Dx with food poisoning, and she had L leg pain and had X ray of the L leg. She has a history of dream enactments and was told that she has PLMs. She uses a nasal mask, but her residual R facial weakness makes it hard for her to use the mask and she is not closing her eye. She has been on ASA 81  mg since her last presumed TIA in 12/13. She has ongoing problems with her L shoulder and had 2 surgeries on it.   Her Past Medical History Is Significant For: Past Medical History  Diagnosis Date  . Restless leg syndrome   . Fibromyalgia   . Rhinitis 06/25/2010  . Gait disorder 10/26/2012  . Hyperlipidemia   . Hypothyroid   . Vitamin D deficiency   . ASCVD (arteriosclerotic cardiovascular disease)   . Hypertension   . TIA (transient ischemic attack)     3        . Arthritis   . GERD (gastroesophageal reflux disease)   . GI bleed   . Sleep apnea   . Depression     Her Past Surgical History Is Significant For: Past Surgical History  Procedure Laterality Date  . Knee susrgery    . Shoulder adhesion release    . Spine surgery    . Appendectomy  1960  . Cesarean section  [place]  . Abdominal hysterectomy  1992  . Rotator cuff repair Left 2005    2 LEFT  1  RIGHT  . Tonsillectomy      T+A  . Tonsillectomy  1960  . Joint replacement      2   LEFT  1 RIGHT   . Toe surgery      LEFT       . Toe  surgery      RIGHT  BIG TOE  . Anterior cervical decompression/discectomy fusion 4 levels N/A 06/14/2015    Procedure: Cervical three-four Cervical four-five Cervical five- six Cervical six- seven  Anterior cervical decompression/diskectomy/fusion;  Surgeon: Erline Levine, MD;  Location: [place] [facility];  Service: Neurosurgery;  Laterality: N/A;  C3-4 C4-5 C5-6 C6-7 Anterior cervical decompression/diskectomy/fusion    Her Family History Is Significant For: Family History  Problem Relation Age of Onset  . Cancer Sister     uterus  . Depression Sister   . Heart disease Father   . Hypertension Father   . Hyperlipidemia Father   . Cancer Mother   . Diabetes Mother   . Osteoporosis Mother   . Leukemia Mother   . Leukemia Sister   . Lupus Sister     Her Social History Is Significant For: Social History   Social History  . Marital Status: Married    Spouse Name: N/A  . Number of Children: 2  . Years of Education: N/A   Occupational History  . Retired Education officer, museum    Social History Main Topics  . Smoking status: Never Smoker   . Smokeless tobacco: None  . Alcohol Use: 0.0 oz/week    0 Standard drinks or equivalent per week     Comment: rarely  . Drug Use: No  . Sexual Activity: Not Asked   Other Topics Concern  . None   Social History Narrative   Drinks about 1 cup of coffee a day, occasional coke zero     Her Allergies Are:  Allergies  Allergen Reactions  . Atorvastatin Other (See Comments)    Extreme pain  . Codeine Other (See Comments)    Headache  . Pravastatin Other (See Comments)    Exreme pain  . Statins Other (See Comments)    Extreme pain  . Amitriptyline Other (See Comments)    Unspecified  . Fish Oil Nausea Only  . Linzess [Linaclotide] Other (See Comments)    Unspecified  . Nuvigil [Armodafinil] Other (See Comments)  Unspecified  . Erythromycin Other (See Comments)    Reaction unspecified  . Erythromycin Base Rash  . Flax Seed [Bio-Flax]  Rash    Linseed  . Hydrocodone-Acetaminophen Rash  . Morphine Nausea And Vomiting and Rash  . Nsaids Nausea Only  . Sertraline Rash  . Sinemet [Carbidopa W-Levodopa] Rash  . Sulfamethoxazole Rash  :   Her Current Medications Are:  Outpatient Encounter Prescriptions as of 08/28/2015  Medication Sig  . aspirin EC 81 MG tablet Take 81 mg by mouth daily.  . bisoprolol-hydrochlorothiazide (ZIAC) 10-6.25 MG tablet TAKE ONE TABLET BY MOUTH ONE TIME DAILY FOR BLOOD PRESSURE  . buPROPion (WELLBUTRIN XL) 300 MG 24 hr tablet Take 300 mg by mouth daily.  . cyclobenzaprine (FLEXERIL) 5 MG tablet Take 1 tablet (5 mg total) by mouth 2 (two) times daily as needed for muscle spasms.  [name] dexlansoprazole (DEXILANT) 60 MG capsule Take 1 capsule (60 mg total) by mouth daily.  . DULoxetine (CYMBALTA) 60 MG capsule TAKE 1 CAPSULE BY MOUTH DAILY.  . fenofibrate micronized (LOFIBRA) 134 MG capsule TAKE 1 CAPSULE BY MOUTH DAILY BEFORE BREAKFAST  . fluticasone (FLONASE) 50 MCG/ACT nasal spray USE TWO SPRAYS IN EACH NOSTRIL DAILY AS NEEDED  . gabapentin (NEURONTIN) 300 MG capsule Take 1 capsule (300 mg total) by mouth 3 (three) times daily.  [name] levothyroxine (SYNTHROID, LEVOTHROID) 100 MCG tablet TAKE 1 TABLET BY MOUTH ON TUES, WED, THURS, SAT AND SUN. TAKE 1& 1/2 TABLET ON MON AND FRI  . Linaclotide (LINZESS) 290 MCG CAPS capsule Take 290 mcg by mouth daily.  [name] oxybutynin (DITROPAN) 5 MG tablet take 1 tablet two times a day  . polyethylene glycol (MIRALAX / GLYCOLAX) packet Take 17 g by mouth daily as needed for mild constipation.  . ranitidine (ZANTAC) 150 MG tablet Take 150 mg by mouth at bedtime.  [name] rOPINIRole (REQUIP) 2 MG tablet Take 1 tablet 4 x day for Restless Leg Syndrome  . Vitamin D, Ergocalciferol, (DRISDOL) 50000 UNITS CAPS capsule Take 1 capsule (50,000 Units total) by mouth daily. (Patient taking differently: Take 50,000 Units by mouth 2 (two) times a week. MON & FRI)  . [DISCONTINUED] Linaclotide  (LINZESS PO) Take 1 capsule by mouth daily.  . [DISCONTINUED] lubiprostone (AMITIZA) 24 MCG capsule Take 24 mcg by mouth 2 (two) times daily with a meal.  . [DISCONTINUED] neomycin-polymyxin-hydrocortisone (CORTISPORIN) otic solution 6  - 8 drops to affected ear 4 x daily for Otitis Externa   No facility-administered encounter medications on file as of 08/28/2015.  :  Review of Systems:  Out of a complete 14 point review of systems, all are reviewed and negative with the exception of these symptoms as listed below:   Review of Systems  Neurological:       Patient has had 2 prior sleep studies.  Originally put on CPAP and later on BiPAP. She feels that she did better on CPAP.  Has not used BiPAP in 1+ years.    Epworth Sleepiness Scale 0= would never doze 1= slight chance of dozing 2= moderate chance of dozing 3= high chance of dozing  Sitting and reading:1 Watching TV:2 Sitting inactive in a public place (ex. Theater or meeting):1 As a passenger in a car for an hour without a break:3 Lying down to rest in the afternoon:3 Sitting and talking to someone:0 Sitting quietly after lunch (no alcohol):2 In a car, while stopped in traffic:1 Total:13  Objective:  Neurologic Exam  Physical Exam Physical Examination:  Filed Vitals:   08/28/15 0925  BP: 112/78  Pulse: 78  Resp: 16    General Examination: The patient is a very pleasant 65 y.o. female in no acute distress. She appears well-developed and well-nourished and well groomed. She is overweight.   HEENT: Normocephalic, atraumatic, pupils are equal, round and reactive to light and accommodation. Funduscopy is normal b/l. She has bilateral cataracts. Extraocular tracking is good without limitation to gaze excursion or nystagmus noted. Normal smooth pursuit is noted. Hearing is grossly intact. Face is asymmetric with chronic R facial weakness and mild R sided hemifacial spasm, both unchanged. Speech is clear with no dysarthria  noted. There is no hypophonia. There is no lip, neck or jaw tremor. Neck is supple with full range of motion. There are no carotid bruits on auscultation. Oropharynx exam reveals: adequate dental hygiene and overall mild airway crowding, less prominent that it appeared before, due to redundant soft palate. Tonsils are absent. Mallampati is class II. Tongue protrudes centrally and palate elevates symmetrically. There is moderate to severe mouth dryness. She has a small nasal anatomy with small nostrils, she has a deviated septum. she has a well-healing anterior cervical scar from neck surgery.   Chest: Clear to auscultation without wheezing, rhonchi or crackles noted.  Heart: S1+S2+0, regular and normal without murmurs, rubs or gallops noted.   Abdomen: Soft, non-tender and non-distended with normal bowel sounds appreciated on auscultation.  Extremities: There is no pitting edema in the distal lower extremities bilaterally. Pedal pulses are intact. Mild puffiness is noted around the ankles bilaterally.  Skin: Warm and dry without trophic changes noted. There are no varicose veins.   Musculoskeletal: exam reveals no obvious joint deformities, tenderness or joint swelling or erythema, but changes c/w arthritis in her hands are noted.    Neurologically:  Mental status: The patient is awake, alert and oriented in all 4 spheres. Her memory, attention, language and knowledge are appropriate. There is no aphasia, agnosia, apraxia or anomia. Speech is clear with normal prosody and enunciation. Thought process is linear. Mood is congruent and affect is normal.  Cranial nerves are as described above under HEENT exam. In addition, shoulder shrug is normal with equal shoulder height noted. Motor exam: Normal bulk, strength and tone is noted. There is no drift, or rebound. She has a very mild UE postural and action tremor, no resting tremor. Romberg shows no sway. Reflexes are 1+ throughout. Fine motor skills are  fairly well intact with normal finger taps, normal hand movements, normal rapid alternating patting, normal foot taps and normal foot agility.   Cerebellar testing shows no dysmetria or intention tremor on finger to nose testing. Heel to shin is unremarkable bilaterally. There is no truncal or gait ataxia.   Sensory exam is intact to light touch.  Gait, station and balance: cautious gait. No leaning to one side is noted. Posture is age-appropriate and stance is narrow based. No problems turning are noted. She has some trouble with tandem walk, unchanged.   Assessment and Plan:   In summary, KALLEN DELATORRE is a very pleasant 65 year old female with a complex underlying medical history of hypertension, hyperlipidemia, Crohn's disease, hypothyroidism, obstructive sleep apnea, right-sided TIA in March 2005 (left-sided weakness and numbness in the face lasting 40 minutes), depression, and a history of right Bell's palsy and Hemifacial spasms, chronic back pain, fibromyalgia, RLS, interstitial cystitis and cervical and lumbar degenerative disc disease (s/p spine surgery of the lumbar spine under Dr. Vertell Limber),  recent neck surgery in December 2016 under Dr. Vertell Limber, recent hospital admission in late December secondary to altered mental status in the context of overmedication, who presents for reevaluation of her obstructive sleep apnea. She was in the past diagnosed with sleep apnea and placed on auto BiPAP. She had suboptimal and poor compliance in the past. She had prior sleep study testing before I met her. She had problems tolerating the mask and the pressure and felt that her fibromyalgia flared up with BiPAP. Nevertheless, she would like to get reevaluated as she is not sleeping well. She snores some but not as bad. She still has restless legs symptoms which are reasonably well controlled with Requip 2 mg twice daily. She also has a history of PLMD. She states that prior sleep study testing showed severe leg  twitches. At this juncture, I suggested we proceed with overnight sleep study testing. She is advised regarding CPAP therapy. We may be able to try CPAP with her. This is if she qualifies for treatment. We will have to see how her sleep study looks. I have also advised her regarding PLMD. Sometimes PLMS improved when underlying sleep apnea is treated with CPAP or BiPAP.

## 2015-09-05 ENCOUNTER — Other Ambulatory Visit: Payer: Self-pay | Admitting: Internal Medicine

## 2015-09-09 ENCOUNTER — Other Ambulatory Visit: Payer: Self-pay | Admitting: Internal Medicine

## 2015-09-12 ENCOUNTER — Ambulatory Visit (INDEPENDENT_AMBULATORY_CARE_PROVIDER_SITE_OTHER): Payer: Medicare Other | Admitting: Neurology

## 2015-09-12 DIAGNOSIS — G4720 Circadian rhythm sleep disorder, unspecified type: Secondary | ICD-10-CM

## 2015-09-12 DIAGNOSIS — R0683 Snoring: Secondary | ICD-10-CM

## 2015-09-12 DIAGNOSIS — G4733 Obstructive sleep apnea (adult) (pediatric): Secondary | ICD-10-CM

## 2015-09-12 DIAGNOSIS — G479 Sleep disorder, unspecified: Secondary | ICD-10-CM

## 2015-09-12 DIAGNOSIS — G4734 Idiopathic sleep related nonobstructive alveolar hypoventilation: Secondary | ICD-10-CM

## 2015-09-12 DIAGNOSIS — G478 Other sleep disorders: Secondary | ICD-10-CM

## 2015-09-13 ENCOUNTER — Telehealth: Payer: Self-pay | Admitting: Neurology

## 2015-09-13 NOTE — Telephone Encounter (Signed)
LM for patient to call back for results. Study faxed to PCP.

## 2015-09-13 NOTE — Sleep Study (Signed)
Please see the scanned sleep study interpretation located in the procedure tab within the chart review section.   

## 2015-09-13 NOTE — Telephone Encounter (Signed)
Patient referred by PCP, last seen by me on 08/28/15, had diagnostic PSG on 09/12/15.   Please call and notify the patient that the recent sleep study did not show any significant obstructive sleep apnea, but her oxygen levels were lower throughout the night, she was given oxygen around 2 AM at 1 lpm, which brought her average oxygen saturation up to around 89% average, as opposed to mid 80s without additional O2. I would recommend, that see a lung specialist/pulmonologist, as she may have an underlying lung issue and may benefit from treatment and also may qualify for oxygen therapy at night at home. Please inform patient that we can make a referral to a pulmonologist of her choice or we can ask for [facility] pulmonology. We can go over the details of the study during a follow up appointment. Arrange a followup appointment and ask about referral to pulm. I will place order. Route or fax report to PCP.  Once you have spoken to patient, you can close this encounter.   Thanks,  Huston Foley, MD, PhD [facility])

## 2015-09-14 ENCOUNTER — Other Ambulatory Visit: Payer: Self-pay | Admitting: Physician Assistant

## 2015-09-14 DIAGNOSIS — G4734 Idiopathic sleep related nonobstructive alveolar hypoventilation: Secondary | ICD-10-CM

## 2015-09-14 NOTE — Progress Notes (Signed)
Pt aware & voiced understanding

## 2015-09-14 NOTE — Telephone Encounter (Signed)
Pt returned Diana's call °

## 2015-09-14 NOTE — Progress Notes (Unsigned)
No sleep apnea on sleep study however oxygen did drop over night so we are going to do an overnight pulse ox to see if you need oxygen at night and refer you to pulmonary to see why your oxygen is going down in the night.

## 2015-09-17 NOTE — Telephone Encounter (Signed)
I spoke to patient and she is aware of results. She had appt with PCP and they have made referral to [facility] Pulmonology.

## 2015-10-08 ENCOUNTER — Other Ambulatory Visit: Payer: Self-pay | Admitting: Internal Medicine

## 2015-10-08 ENCOUNTER — Encounter: Payer: Self-pay | Admitting: Internal Medicine

## 2015-10-16 ENCOUNTER — Telehealth: Payer: Self-pay | Admitting: Internal Medicine

## 2015-10-16 NOTE — Telephone Encounter (Signed)
[facility] faxed communication. Unable to schedule patient. Patient has been contacted, states she will call back to schedule the overnght oximetery.

## 2015-10-24 ENCOUNTER — Ambulatory Visit (INDEPENDENT_AMBULATORY_CARE_PROVIDER_SITE_OTHER): Payer: Medicare Other | Admitting: Internal Medicine

## 2015-10-24 ENCOUNTER — Encounter: Payer: Self-pay | Admitting: Internal Medicine

## 2015-10-24 VITALS — BP 114/76 | HR 80 | Temp 97.7°F | Resp 16 | Ht 63.25 in | Wt 172.4 lb

## 2015-10-24 DIAGNOSIS — J0140 Acute pansinusitis, unspecified: Secondary | ICD-10-CM | POA: Diagnosis not present

## 2015-10-24 DIAGNOSIS — H6500 Acute serous otitis media, unspecified ear: Secondary | ICD-10-CM

## 2015-10-24 MED ORDER — PREDNISONE 20 MG PO TABS: ORAL_TABLET | ORAL | Status: DC

## 2015-10-24 MED ORDER — LEVOFLOXACIN 500 MG PO TABS: ORAL_TABLET | ORAL | Status: DC

## 2015-10-24 NOTE — Progress Notes (Signed)
Subjective:    Patient ID: Crystal [name], female    DOB: 1950-12-28, 65 y.o.   MRN: [id number]  HPI Patient has several issues : has sinus pressure & congestion, pressure in ears , L>R and has a lump in her lower lipp that gets tender that expresses a whitish material and which she had drained in the past by Dr Narda Bonds. No fever, chills, sweats, but feels fatigued.   Outpatient Prescriptions Prior to Visit  Medication Sig Dispense Refill  . aspirin EC 81 MG tablet Take 81 mg by mouth daily.    . bisoprolol-hydrochlorothiazide (ZIAC) 10-6.25 MG tablet TAKE ONE TABLET BY MOUTH ONE TIME DAILY FOR BLOOD PRESSURE 90 tablet 1  . buPROPion (WELLBUTRIN XL) 300 MG 24 hr tablet TAKE 1 TABLET BY MOUTH EVERY MORNING FOR MOOD AND FOCUS ATTENTION 90 tablet 1  . cyclobenzaprine (FLEXERIL) 5 MG tablet Take 1 tablet (5 mg total) by mouth 2 (two) times daily as needed for muscle spasms. 20 tablet 0  . dexlansoprazole (DEXILANT) 60 MG capsule Take 1 capsule (60 mg total) by mouth daily. 30 capsule 0  . DULoxetine (CYMBALTA) 60 MG capsule TAKE 1 CAPSULE BY MOUTH DAILY. 30 capsule 2  . fenofibrate micronized (LOFIBRA) 134 MG capsule TAKE 1 CAPSULE BY MOUTH DAILY BEFORE BREAKFAST 30 capsule 0  . fluticasone (FLONASE) 50 MCG/ACT nasal spray USE TWO SPRAYS IN EACH NOSTRIL DAILY AS NEEDED 16 g 99  . gabapentin (NEURONTIN) 300 MG capsule Take 1 capsule (300 mg total) by mouth 3 (three) times daily. 60 capsule 0  . levothyroxine (SYNTHROID, LEVOTHROID) 100 MCG tablet TAKE 1 TABLET BY MOUTH ON TUES, WED, THURS, SAT AND SUN. TAKE 1& 1/2 TABLET ON MON AND FRI 102 tablet 3  . Linaclotide (LINZESS) 290 MCG CAPS capsule Take 290 mcg by mouth daily.    [name] losartan (COZAAR) 50 MG tablet TAKE ONE TABLET BY MOUTH ONE TIME DAILY 30 tablet 3  . oxybutynin (DITROPAN) 5 MG tablet take 1 tablet two times a day 180 tablet PRN  . polyethylene glycol (MIRALAX / GLYCOLAX) packet Take 17 g by mouth daily as needed for mild  constipation. 14 each 0  . ranitidine (ZANTAC) 150 MG tablet Take 150 mg by mouth at bedtime.    [name] rOPINIRole (REQUIP) 2 MG tablet Take 1 tablet 4 x day for Restless Leg Syndrome 360 tablet 1  . Vitamin D, Ergocalciferol, (DRISDOL) 50000 UNITS CAPS capsule Take 1 capsule (50,000 Units total) by mouth daily. (Patient taking differently: Take 50,000 Units by mouth 2 (two) times a week. MON & FRI) 30 capsule PRN   No facility-administered medications prior to visit.   Allergies  Allergen Reactions  . Atorvastatin Other (See Comments)    Extreme pain  . Codeine Other (See Comments)    Headache  . Pravastatin Other (See Comments)    Exreme pain  . Statins Other (See Comments)    Extreme pain  . Amitriptyline Other (See Comments)    Unspecified  . Fish Oil Nausea Only  . Linzess [Linaclotide] Other (See Comments)    Unspecified  . Nuvigil [Armodafinil] Other (See Comments)    Unspecified  . Erythromycin Other (See Comments)    Reaction unspecified  . Erythromycin Base Rash  . Flax Seed [Bio-Flax] Rash    Linseed  . Hydrocodone-Acetaminophen Rash  . Morphine Nausea And Vomiting and Rash  . Nsaids Nausea Only  . Sertraline Rash  . Sinemet [Carbidopa W-Levodopa] Rash  . Sulfamethoxazole Rash  Past Medical History  Diagnosis Date  . Restless leg syndrome   . Fibromyalgia   . Rhinitis 06/25/2010  . Gait disorder 10/26/2012  . Hyperlipidemia   . Hypothyroid   . Vitamin D deficiency   . ASCVD (arteriosclerotic cardiovascular disease)   . Hypertension   . TIA (transient ischemic attack)     3        . Arthritis   . GERD (gastroesophageal reflux disease)   . GI bleed   . Sleep apnea   . Depression    Review of Systems 10 point systems review negative except as above.    Objective:   Physical Exam  BP 114/76 mmHg  Pulse 80  Temp(Src) 97.7 F (36.5 C)  Resp 16  Ht 5' 3.25" (1.607 m)  Wt 172 lb 6.4 oz (78.2 kg)  BMI 30.28 kg/m2  HEENT - Eac's patent. TM's retracted  2+. EOM's full. (+) tender frontal/maxillary areas. PERRLA. NasoOroPharynx clear. Palpable firm subcutaneous mass ~ 1 x 1 CM in the left lower lip Neck - supple. Nl Thyroid. Carotids 2+ & No bruits, nodes, JVD Chest - Clear equal BS w/o Rales, rhonchi, wheezes. Cor - Nl HS. RRR w/o sig M. MS- FROM w/o deformities. Muscle power, tone and bulk Nl. Gait Nl. Neuro - Old R hemi-facial paresis from remote Bell's Palsy. No obvious Cr N abnormalities.Nl w/o focal abnormalities. Psyche - Mental status normal & appropriate.  No delusions, ideations or obvious mood abnormalities.    Assessment & Plan:   1. Acute pansinusitis, recurrence not specified  - levofloxacin (LEVAQUIN) 500 MG tablet; Take 1 tablet daily with food for infection  Dispense: 15 tablet; Refill: 1 - predniSONE (DELTASONE) 20 MG tablet; 1 tab 3 x day for 3 days, then 1 tab 2 x day for 3 days, then 1 tab 1 x day for 5 days  Dispense: 20 tablet; Refill: 0  2. Acute serous otitis media, recurrence not specified, unspecified laterality  - Discussed meds/SE's. Patient desires to stop Cymbalta & was advised to take qod x 2 wks, then q3d x 2 weeks , then stop - Also advised to see Dr C. Newman again for the lump in her lip.

## 2015-10-26 ENCOUNTER — Ambulatory Visit (INDEPENDENT_AMBULATORY_CARE_PROVIDER_SITE_OTHER): Payer: Medicare Other

## 2015-10-26 ENCOUNTER — Ambulatory Visit (INDEPENDENT_AMBULATORY_CARE_PROVIDER_SITE_OTHER): Payer: Medicare Other | Admitting: Podiatry

## 2015-10-26 ENCOUNTER — Encounter: Payer: Self-pay | Admitting: Podiatry

## 2015-10-26 DIAGNOSIS — R52 Pain, unspecified: Secondary | ICD-10-CM

## 2015-10-26 DIAGNOSIS — M779 Enthesopathy, unspecified: Secondary | ICD-10-CM

## 2015-10-26 NOTE — Progress Notes (Signed)
   Subjective:    Patient ID: Crystal [name], female    DOB: 03-18-1951, 65 y.o.   MRN: [id number]  HPI Chief Complaint  Patient presents with  . Foot Pain    ''INJURED RT FOOT/TOES STILL SORE.''   PT STATED INJURED RT FOOT LAST NIGHT AND STILL PAINFUL. FOOT IS GETTING WORSE AND TRIED ICE-NO RELIEF.  Review of Systems  Musculoskeletal: Positive for gait problem.       Objective:   Physical Exam        Assessment & Plan:

## 2015-10-29 NOTE — Progress Notes (Signed)
Subjective:     Patient ID: Crystal [name], female   DOB: 09-13-1950, 65 y.o.   MRN: [id number]  HPI patient states I jammed my right foot several days ago and I was concerned that I might have something or done something from my previous surgery   Review of Systems     Objective:   Physical Exam Neurovascular status intact muscle strength adequate with patient found to have inflammation and pain around the right big toe and lateral foot with mild edema noted    Assessment:     Possibility for trauma to the right foot secondary to injury that occurred and possible fracture    Plan:     X-rays reviewed and advised on elevation compression and reappoint to recheck again in the next several weeks

## 2015-11-07 ENCOUNTER — Other Ambulatory Visit: Payer: Self-pay | Admitting: Otolaryngology

## 2015-11-08 ENCOUNTER — Ambulatory Visit (INDEPENDENT_AMBULATORY_CARE_PROVIDER_SITE_OTHER)
Admission: RE | Admit: 2015-11-08 | Discharge: 2015-11-08 | Disposition: A | Discharge status: Home / Self Care | Payer: Medicare Other | Source: Ambulatory Visit | Attending: Pulmonary Disease | Admitting: Pulmonary Disease

## 2015-11-08 ENCOUNTER — Ambulatory Visit (INDEPENDENT_AMBULATORY_CARE_PROVIDER_SITE_OTHER): Payer: Medicare Other | Admitting: Pulmonary Disease

## 2015-11-08 ENCOUNTER — Encounter: Payer: Self-pay | Admitting: Pulmonary Disease

## 2015-11-08 VITALS — BP 112/68 | HR 100 | Ht 63.25 in | Wt 172.0 lb

## 2015-11-08 DIAGNOSIS — R0600 Dyspnea, unspecified: Secondary | ICD-10-CM

## 2015-11-08 DIAGNOSIS — R0609 Other forms of dyspnea: Secondary | ICD-10-CM | POA: Insufficient documentation

## 2015-11-08 DIAGNOSIS — G2581 Restless legs syndrome: Secondary | ICD-10-CM

## 2015-11-08 DIAGNOSIS — K219 Gastro-esophageal reflux disease without esophagitis: Secondary | ICD-10-CM

## 2015-11-08 DIAGNOSIS — G4733 Obstructive sleep apnea (adult) (pediatric): Secondary | ICD-10-CM

## 2015-11-08 NOTE — Progress Notes (Signed)
Subjective:    Patient ID: Crystal [name], female    DOB: 17-Mar-1951, 65 y.o.   MRN: [id number]  HPI  This is the case of Crystal [name], 65 y.o. Female, who was referred byDid n Dr. Vicie Mutters in consultation regarding OSA.   As you very well know, patient was dxed with OSA in 2006. Had a sleep study, unknown severity. Ended up on cpap 9 cm H2O in 2006.   Felt better using cpap. More energy. Less sleepiness. Did not tolerate mask.  Was switched to Bipap in 2012. Had a rpt study then.  She used bipap from 2012 until 2016. She stopped using it since she could not tolerate the pressure. Has issues with mask as well. Uncomfortable. Best for her was nasal mask.   Pt had a sleep study in 08/2015 where AHI was 4.1. Had sig o2 desatn.    Has RLS controlled with meds.    Non smoker. Not known to have asthma or copd.  Has sinus allergies. Stable off meds.   Past Medical History  Diagnosis Date  . Restless leg syndrome   . Fibromyalgia   . Rhinitis 06/25/2010  . Gait disorder 10/26/2012  . Hyperlipidemia   . Hypothyroid   . Vitamin D deficiency   . ASCVD (arteriosclerotic cardiovascular disease)   . Hypertension   . TIA (transient ischemic attack)     3        . Arthritis   . GERD (gastroesophageal reflux disease)   . GI bleed   . Sleep apnea   . Depression    (-) CA, DVT  Family History  Problem Relation Age of Onset  . Cancer Sister     uterus  . Depression Sister   . Heart disease Father   . Hypertension Father   . Hyperlipidemia Father   . Cancer Mother   . Diabetes Mother   . Osteoporosis Mother   . Leukemia Mother   . Leukemia Sister   . Lupus Sister      Past Surgical History  Procedure Laterality Date  . Knee susrgery    . Shoulder adhesion release    . Spine surgery    . Appendectomy  1960  . Cesarean section  [place]  . Abdominal hysterectomy  1992  . Rotator cuff repair Left 2005    2 LEFT  1  RIGHT  . Tonsillectomy      T+A  .  Tonsillectomy  1960  . Joint replacement      2   LEFT  1 RIGHT   . Toe surgery      LEFT       . Toe surgery      RIGHT  BIG TOE  . Anterior cervical decompression/discectomy fusion 4 levels N/A 06/14/2015    Procedure: Cervical three-four Cervical four-five Cervical five- six Cervical six- seven  Anterior cervical decompression/diskectomy/fusion;  Surgeon: Erline Levine, MD;  Location: [facility];  Service: Neurosurgery;  Laterality: N/A;  C3-4 C4-5 C5-6 C6-7 Anterior cervical decompression/diskectomy/fusion    Social History   Social History  . Marital Status: Married    Spouse Name: N/A  . Number of Children: 2  . Years of Education: N/A   Occupational History  . Retired Education officer, museum    Social History Main Topics  . Smoking status: Never Smoker   . Smokeless tobacco: Not on file  . Alcohol Use: 0.0 oz/week    0 Standard drinks or equivalent  per week     Comment: rarely  . Drug Use: No  . Sexual Activity: Not on file   Other Topics Concern  . Not on file   Social History Narrative   Drinks about 1 cup of coffee a day, occasional coke zero     Was a Pharmacist, hospital.   Allergies  Allergen Reactions  . Atorvastatin Other (See Comments)    Extreme pain  . Codeine Other (See Comments)    Headache  . Pravastatin Other (See Comments)    Exreme pain  . Statins Other (See Comments)    Extreme pain  . Amitriptyline Other (See Comments)    Unspecified  . Fish Oil Nausea Only  . Linzess [Linaclotide] Other (See Comments)    Unspecified  . Nuvigil [Armodafinil] Other (See Comments)    Unspecified  . Erythromycin Other (See Comments)    Reaction unspecified  . Erythromycin Base Rash  . Flax Seed [Bio-Flax] Rash    Linseed  . Hydrocodone-Acetaminophen Rash  . Morphine Nausea And Vomiting and Rash  . Nsaids Nausea Only  . Sertraline Rash  . Sinemet [Carbidopa W-Levodopa] Rash  . Sulfamethoxazole Rash     Outpatient Prescriptions Prior to Visit  Medication Sig  Dispense Refill  . aspirin EC 81 MG tablet Take 81 mg by mouth daily.    . bisoprolol-hydrochlorothiazide (ZIAC) 10-6.25 MG tablet TAKE ONE TABLET BY MOUTH ONE TIME DAILY FOR BLOOD PRESSURE 90 tablet 1  . buPROPion (WELLBUTRIN XL) 300 MG 24 hr tablet TAKE 1 TABLET BY MOUTH EVERY MORNING FOR MOOD AND FOCUS ATTENTION 90 tablet 1  . cetirizine (ZYRTEC) 10 MG tablet TAKE 1 TABLET (10 MG TOTAL) BY MOUTH AT BEDTIME.  99  . cyclobenzaprine (FLEXERIL) 5 MG tablet Take 1 tablet (5 mg total) by mouth 2 (two) times daily as needed for muscle spasms. 20 tablet 0  . dexlansoprazole (DEXILANT) 60 MG capsule Take 1 capsule (60 mg total) by mouth daily. 30 capsule 0  . DULoxetine (CYMBALTA) 60 MG capsule TAKE 1 CAPSULE BY MOUTH DAILY. 30 capsule 2  . fenofibrate micronized (LOFIBRA) 134 MG capsule TAKE 1 CAPSULE BY MOUTH DAILY BEFORE BREAKFAST 30 capsule 0  . fluticasone (FLONASE) 50 MCG/ACT nasal spray USE TWO SPRAYS IN EACH NOSTRIL DAILY AS NEEDED 16 g 99  . gabapentin (NEURONTIN) 300 MG capsule Take 1 capsule (300 mg total) by mouth 3 (three) times daily. 60 capsule 0  . levothyroxine (SYNTHROID, LEVOTHROID) 100 MCG tablet TAKE 1 TABLET BY MOUTH ON TUES, WED, THURS, SAT AND SUN. TAKE 1& 1/2 TABLET ON MON AND FRI 102 tablet 3  . Linaclotide (LINZESS) 290 MCG CAPS capsule Take 290 mcg by mouth daily.    [name] losartan (COZAAR) 50 MG tablet TAKE ONE TABLET BY MOUTH ONE TIME DAILY 30 tablet 3  . oxybutynin (DITROPAN) 5 MG tablet take 1 tablet two times a day 180 tablet PRN  . polyethylene glycol (MIRALAX / GLYCOLAX) packet Take 17 g by mouth daily as needed for mild constipation. 14 each 0  . polyethylene glycol powder (GLYCOLAX/MIRALAX) powder MIX 1 CAPFUL IN 8OZ'S OF WATER DAILY AT BEDTIME AS DIRECTED  11  . prednisoLONE acetate (PRED FORTE) 1 % ophthalmic suspension INSTILL ONE DROP TO OPERATIVE EYE FOUR TIMES DAILY STARTING AFTER SURGERY  0  . ranitidine (ZANTAC) 150 MG tablet Take 150 mg by mouth at bedtime.    [name]  rOPINIRole (REQUIP) 2 MG tablet Take 1 tablet 4 x day for Restless Leg Syndrome  360 tablet 1  . Vitamin D, Ergocalciferol, (DRISDOL) 50000 UNITS CAPS capsule Take 1 capsule (50,000 Units total) by mouth daily. (Patient taking differently: Take 50,000 Units by mouth 2 (two) times a week. MON & FRI) 30 capsule PRN  . predniSONE (DELTASONE) 20 MG tablet 1 tab 3 x day for 3 days, then 1 tab 2 x day for 3 days, then 1 tab 1 x day for 5 days 20 tablet 0  . levofloxacin (LEVAQUIN) 500 MG tablet Take 1 tablet daily with food for infection 15 tablet 1   No facility-administered medications prior to visit.   Meds ordered this encounter  Medications  . cephALEXin (KEFLEX) 250 MG capsule    Sig: Take 250 mg by mouth 3 (three) times daily.        Review of Systems  Constitutional: Negative.  Negative for fever and unexpected weight change.  HENT: Positive for congestion, ear pain and sinus pressure. Negative for dental problem, nosebleeds, postnasal drip, rhinorrhea, sneezing, sore throat and trouble swallowing.   Eyes: Positive for redness and visual disturbance. Negative for itching.  Respiratory: Positive for shortness of breath. Negative for cough, chest tightness and wheezing.   Cardiovascular: Negative.  Negative for palpitations and leg swelling.  Gastrointestinal: Negative.  Negative for nausea and vomiting.  Endocrine: Negative.   Genitourinary: Negative.  Negative for dysuria.  Musculoskeletal: Positive for back pain, joint swelling, arthralgias and neck pain.  Skin: Negative.  Negative for rash.  Allergic/Immunologic: Positive for environmental allergies.  Neurological: Positive for dizziness, light-headedness and headaches.  Hematological: Does not bruise/bleed easily.  Psychiatric/Behavioral: Negative.  Negative for dysphoric mood. The patient is not nervous/anxious.        Objective:   Physical Exam   Vitals:  Filed Vitals:   11/08/15 1405  BP: 112/68  Pulse: 100  Height:  5' 3.25" (1.607 m)  Weight: 172 lb (78.019 kg)  SpO2: 98%    Constitutional/General:  Pleasant, well-nourished, well-developed, not in any distress,  Comfortably seating.  Well kempt  Body mass index is 30.21 kg/(m^2). Wt Readings from Last 3 Encounters:  11/08/15 172 lb (78.019 kg)  10/24/15 172 lb 6.4 oz (78.2 kg)  08/28/15 172 lb (78.019 kg)      HEENT: Pupils equal and reactive to light and accommodation. Anicteric sclerae. Normal nasal mucosa.   No oral  lesions,  mouth clear,  oropharynx clear, no postnasal drip. (-) Oral thrush. No dental caries.  Airway - Mallampati class III- IV  Neck: No masses. Midline trachea. No JVD, (-) LAD. (-) bruits appreciated.  Respiratory/Chest: Grossly normal chest. (-) deformity. (-) Accessory muscle use.  Symmetric expansion. (-) Tenderness on palpation.  Resonant on percussion.  Diminished BS on both lower lung zones. (-) wheezing, crackles, rhonchi (-) egophony  Cardiovascular: Regular rate and  rhythm, heart sounds normal, no murmur or gallops, no peripheral edema  Gastrointestinal:  Normal bowel sounds. Soft, non-tender. No hepatosplenomegaly.  (-) masses.   Musculoskeletal:  Normal muscle tone. Normal gait.   Extremities: Grossly normal. (-) clubbing, cyanosis.  (-) edema  Skin: (-) rash,lesions seen.   Neurological/Psychiatric : alert, oriented to time, place, person. Normal mood and affect           Assessment & Plan:  Obstructive sleep apnea Patient was dxed with OSA in 2006. Had a sleep study, unknown severity. Ended up on cpap 9 cm H2O in 2006.   Felt better using cpap. More energy. Less sleepiness. Did not tolerate mask.  Was  switched to [place] in 2012. Had a rpt study then.  She used bipap from 2012 until 2016. She stopped using it since she could not tolerate the pressure. Has issues with mask as well. Uncomfortable. Best for her was nasal mask.   Pt had a sleep study in 08/2015 where AHI was 4.1. Had sig  o2 desatn.    Has RLS controlled with meds.   Pt felt better over all with cpap. Did not tolerate bipap.  Plan: 1. HST. If (+) OSA, try autocpap 5-10 cm H2O. Does not tolerate high pressure. Also, needs mask fitting session. She likes nasal mask I think.  2. If with osa, need to correct it and get noc ox on cpap. Pt with sig o2 desatn during 08/2015 PSG.  3. RLS is controlled.   Exertional dyspnea Has SOB with exertion. Non smoker. Sig o2 desatn with PSG. Needs pft, abg, CXR. May need echo.   RESTLESS LEG SYNDROME Controlled with meds.  Cont with requip.   GERD Cont PPI. Asp precaution. Diet changes.                Thank you very much for letting me participate in this patient's care. Please do not hesitate to give me a call if you have any questions or concerns regarding the treatment plan.   Patient will follow up with me in 3 mos.     Monica Becton, MD 11/08/2015   7:19 PM [facility] [place] Pager: [phone] Office: [phone], Fax: [phone]                                                                                                                                                                                                                                                                                                                                     ,

## 2015-11-08 NOTE — Assessment & Plan Note (Signed)
Cont PPI. Asp precaution. Diet changes.

## 2015-11-08 NOTE — Assessment & Plan Note (Addendum)
Patient was dxed with OSA in 2006. Had a sleep study, unknown severity. Ended up on cpap 9 cm H2O in 2006.   Felt better using cpap. More energy. Less sleepiness. Did not tolerate mask.  Was switched to Bipap in 2012. Had a rpt study then.  She used bipap from 2012 until 2016. She stopped using it since she could not tolerate the pressure. Has issues with mask as well. Uncomfortable. Best for her was nasal mask.   Pt had a sleep study in 08/2015 where AHI was 4.1. Had sig o2 desatn.    Has RLS controlled with meds.   Pt felt better over all with cpap. Did not tolerate bipap.  Plan: 1. HST. If (+) OSA, try autocpap 5-10 cm H2O. Does not tolerate high pressure. Also, needs mask fitting session. She likes nasal mask I think.  2. If with osa, need to correct it and get noc ox on cpap. Pt with sig o2 desatn during 08/2015 PSG.  3. RLS is controlled.

## 2015-11-08 NOTE — Assessment & Plan Note (Signed)
Controlled with meds.  Cont with requip.

## 2015-11-08 NOTE — Assessment & Plan Note (Signed)
Has SOB with exertion. Non smoker. Sig o2 desatn with PSG. Needs pft, abg, CXR. May need echo.

## 2015-11-08 NOTE — Patient Instructions (Signed)
1. We will order you a home sleep study. 2. We will order you a breathing test, blood test, and chest xray.  Return to clinic in 3 mos, sooner if with issues.

## 2015-11-09 ENCOUNTER — Telehealth: Payer: Self-pay | Admitting: Pulmonary Disease

## 2015-11-09 NOTE — Telephone Encounter (Signed)
Result Notes     Notes Recorded by Vito Backers, CMA on 11/09/2015 at 10:24 AM Attempted to contact patient regarding results. Left message on voicemail for patient to return call. ------  Notes Recorded by Louann Sjogren, MD on 11/08/2015 at 10:21 PM pls tell pt CXR showed no active lung dse. R hemi diaphragm is elevated >> just like before. Nothing to do.   lmtcb x1 for pt.

## 2015-11-12 NOTE — Telephone Encounter (Signed)
Patient notified of CXR results..  No questions or concerns at this time. Nothing further needed.

## 2015-11-12 NOTE — Telephone Encounter (Signed)
[phone], pt cb

## 2015-11-13 ENCOUNTER — Encounter: Payer: Self-pay | Admitting: Internal Medicine

## 2015-11-13 ENCOUNTER — Ambulatory Visit (INDEPENDENT_AMBULATORY_CARE_PROVIDER_SITE_OTHER): Payer: Medicare Other | Admitting: Internal Medicine

## 2015-11-13 VITALS — BP 134/80 | HR 90 | Temp 98.2°F | Resp 16 | Ht 63.25 in | Wt 171.0 lb

## 2015-11-13 DIAGNOSIS — F32A Depression, unspecified: Secondary | ICD-10-CM

## 2015-11-13 DIAGNOSIS — M797 Fibromyalgia: Secondary | ICD-10-CM

## 2015-11-13 DIAGNOSIS — K50919 Crohn's disease, unspecified, with unspecified complications: Secondary | ICD-10-CM | POA: Diagnosis not present

## 2015-11-13 DIAGNOSIS — J309 Allergic rhinitis, unspecified: Secondary | ICD-10-CM | POA: Diagnosis not present

## 2015-11-13 DIAGNOSIS — Z0001 Encounter for general adult medical examination with abnormal findings: Secondary | ICD-10-CM | POA: Diagnosis not present

## 2015-11-13 DIAGNOSIS — I2510 Atherosclerotic heart disease of native coronary artery without angina pectoris: Secondary | ICD-10-CM | POA: Diagnosis not present

## 2015-11-13 DIAGNOSIS — E119 Type 2 diabetes mellitus without complications: Secondary | ICD-10-CM

## 2015-11-13 DIAGNOSIS — Z1159 Encounter for screening for other viral diseases: Secondary | ICD-10-CM | POA: Diagnosis not present

## 2015-11-13 DIAGNOSIS — G4733 Obstructive sleep apnea (adult) (pediatric): Secondary | ICD-10-CM | POA: Diagnosis not present

## 2015-11-13 DIAGNOSIS — K219 Gastro-esophageal reflux disease without esophagitis: Secondary | ICD-10-CM | POA: Diagnosis not present

## 2015-11-13 DIAGNOSIS — Z79899 Other long term (current) drug therapy: Secondary | ICD-10-CM | POA: Diagnosis not present

## 2015-11-13 DIAGNOSIS — R6889 Other general symptoms and signs: Secondary | ICD-10-CM

## 2015-11-13 DIAGNOSIS — E559 Vitamin D deficiency, unspecified: Secondary | ICD-10-CM

## 2015-11-13 DIAGNOSIS — M12819 Other specific arthropathies, not elsewhere classified, unspecified shoulder: Secondary | ICD-10-CM

## 2015-11-13 DIAGNOSIS — G2581 Restless legs syndrome: Secondary | ICD-10-CM

## 2015-11-13 DIAGNOSIS — R269 Unspecified abnormalities of gait and mobility: Secondary | ICD-10-CM

## 2015-11-13 DIAGNOSIS — I10 Essential (primary) hypertension: Secondary | ICD-10-CM | POA: Diagnosis not present

## 2015-11-13 DIAGNOSIS — M4802 Spinal stenosis, cervical region: Secondary | ICD-10-CM

## 2015-11-13 DIAGNOSIS — E039 Hypothyroidism, unspecified: Secondary | ICD-10-CM | POA: Diagnosis not present

## 2015-11-13 DIAGNOSIS — Z0000 Encounter for general adult medical examination without abnormal findings: Secondary | ICD-10-CM

## 2015-11-13 DIAGNOSIS — E785 Hyperlipidemia, unspecified: Secondary | ICD-10-CM | POA: Diagnosis not present

## 2015-11-13 DIAGNOSIS — K589 Irritable bowel syndrome without diarrhea: Secondary | ICD-10-CM

## 2015-11-13 DIAGNOSIS — F329 Major depressive disorder, single episode, unspecified: Secondary | ICD-10-CM

## 2015-11-13 DIAGNOSIS — M12519 Traumatic arthropathy, unspecified shoulder: Secondary | ICD-10-CM

## 2015-11-13 DIAGNOSIS — M542 Cervicalgia: Secondary | ICD-10-CM

## 2015-11-13 LAB — BASIC METABOLIC PANEL WITH GFR
BUN: 15 mg/dL (ref 7–25)
CO2: 29 mmol/L (ref 20–31)
Calcium: 9.4 mg/dL (ref 8.6–10.4)
Chloride: 102 mmol/L (ref 98–110)
Creat: 0.9 mg/dL (ref 0.50–0.99)
GFR, Est African American: 77 mL/min (ref >=60)
GFR, Est Non African American: 66 mL/min (ref >=60)
Glucose, Bld: 93 mg/dL (ref 65–99)
Potassium: 3.8 mmol/L (ref 3.5–5.3)
Sodium: 142 mmol/L (ref 135–146)

## 2015-11-13 LAB — CBC WITH DIFFERENTIAL/PLATELET
Basophils Absolute: 59 cells/uL (ref 0–200)
Basophils Relative: 1 %
Eosinophils Absolute: 236 cells/uL (ref 15–500)
Eosinophils Relative: 4 %
HCT: 42.8 % (ref 35.0–45.0)
Hemoglobin: 14.6 g/dL (ref 11.7–15.5)
Lymphocytes Relative: 23 %
Lymphs Abs: 1357 cells/uL (ref 850–3900)
MCH: 30.8 pg (ref 27.0–33.0)
MCHC: 34.1 g/dL (ref 32.0–36.0)
MCV: 90.3 fL (ref 80.0–100.0)
MPV: 9 fL (ref 7.5–12.5)
Monocytes Absolute: 708 cells/uL (ref 200–950)
Monocytes Relative: 12 %
Neutro Abs: 3540 cells/uL (ref 1500–7800)
Neutrophils Relative %: 60 %
Platelets: 229 10*3/uL (ref 140–400)
RBC: 4.74 MIL/uL (ref 3.80–5.10)
RDW: 13.5 % (ref 11.0–15.0)
WBC: 5.9 10*3/uL (ref 3.8–10.8)

## 2015-11-13 LAB — LIPID PANEL
Cholesterol: 215 mg/dL — ABNORMAL HIGH (ref 125–200)
HDL: 40 mg/dL — ABNORMAL LOW (ref >=46)
LDL Cholesterol: 131 mg/dL — ABNORMAL HIGH (ref <130)
Total CHOL/HDL Ratio: 5.4 Ratio — ABNORMAL HIGH (ref <=5.0)
Triglycerides: 218 mg/dL — ABNORMAL HIGH (ref <150)
VLDL: 44 mg/dL — ABNORMAL HIGH (ref <30)

## 2015-11-13 LAB — HEPATIC FUNCTION PANEL
ALT: 11 U/L (ref 6–29)
AST: 12 U/L (ref 10–35)
Albumin: 4.2 g/dL (ref 3.6–5.1)
Alkaline Phosphatase: 46 U/L (ref 33–130)
Bilirubin, Direct: 0.1 mg/dL (ref <=0.2)
Indirect Bilirubin: 0.3 mg/dL (ref 0.2–1.2)
Total Bilirubin: 0.4 mg/dL (ref 0.2–1.2)
Total Protein: 6.6 g/dL (ref 6.1–8.1)

## 2015-11-13 LAB — HEMOGLOBIN A1C
Hgb A1c MFr Bld: 6.1 % — ABNORMAL HIGH (ref <5.7)
Mean Plasma Glucose: 128 mg/dL

## 2015-11-13 MED ORDER — NEOMYCIN-POLYMYXIN-HC 3.5-10000-1 OT SUSP: 4.0000 [drp] | Freq: Four times a day (QID) | OTIC | Status: DC

## 2015-11-13 MED ORDER — LOSARTAN POTASSIUM 50 MG PO TABS: 50.0000 mg | ORAL_TABLET | Freq: Every day | ORAL | Status: DC

## 2015-11-13 MED ORDER — AZELASTINE HCL 0.1 % NA SOLN: 1.0000 | Freq: Two times a day (BID) | NASAL | Status: DC

## 2015-11-13 NOTE — Progress Notes (Signed)
MEDICARE ANNUAL WELLNESS VISIT AND FOLLOW UP  Assessment:    1. Essential hypertension -well controlled currently -monitor at home -dash diet -exercise as tolerated  2. ASCVD (arteriosclerotic cardiovascular disease) -as seen on imaging -cont to maximize cholesterol management and BP management  3. Obstructive sleep apnea -cont use of cpap  4. Allergic rhinitis, unspecified allergic rhinitis type -cont meds  5. T2_NIDDM -monitor CBGs - Hemoglobin A1c  6. Hypothyroidism, unspecified hypothyroidism type -cont levothyroxine - TSH  7. Hyperlipidemia  - Lipid panel  8. Vitamin D deficiency -cont supplement  9. Medication management  - CBC with Differential/Platelet - BASIC METABOLIC PANEL WITH GFR - Hepatic function panel  10. Need for hepatitis C screening test  - Hepatitis C antibody  11. Gastroesophageal reflux disease, esophagitis presence not specified -cont meds  12. Crohn's disease with complication, unspecified gastrointestinal tract location [facility]) -followed by Dr. Kinnie Scales  13. IBS (irritable colon syndrome) -followed by Dr. Kinnie Scales  14. Fibromyalgia -followed by Dr. Ollen Bowl  15. Rotator cuff arthropathy, unspecified laterality -followed by ortho  16. RESTLESS LEG SYNDROME -followed by Dr. Ollen Bowl  17. Gait disorder -followed by neuro  18. Depression, controlled -cont current meds  19. Morbid obesity, unspecified obesity type (HCC) -diet and exercise as tolerated  20. Medicare annual wellness visit, subsequent -due next year  59. Cervical pain -followed by neurosurgery  22. Spinal stenosis in cervical region -followed by neurosurgery  Over 30 minutes of exam, counseling, chart review, and critical decision making was performed  Plan:   During the course of the visit the patient was educated and counseled about appropriate screening and preventive services including:    Pneumococcal vaccine   Influenza vaccine  Td  vaccine  Prevnar 13  Screening electrocardiogram  Screening mammography  Bone densitometry screening  Colorectal cancer screening  Diabetes screening  Glaucoma screening  Nutrition counseling   Advanced directives: given info/requested copies  Conditions/risks identified: Diabetes is at goal, ACE/ARB therapy: Yes. Urinary Incontinence is not an issue: discussed non pharmacology and pharmacology options.  Fall risk: low- discussed PT, home fall assessment, medications.    Subjective:   Crystal [name] is a 65 y.o. female who presents for Medicare Annual Wellness Visit and 3 month follow up on hypertension, prediabetes, hyperlipidemia, vitamin D def.  Date of last medicare wellness visit is unknown.   Her blood pressure has been controlled at home, today their BP is BP: 134/80 mmHg She does workout. She denies chest pain, shortness of breath, dizziness.  She is not on cholesterol medication and denies myalgias. Her cholesterol is at goal. The cholesterol last visit was:   Lab Results  Component Value Date   CHOL 215* 11/13/2015   HDL 40* 11/13/2015   LDLCALC 131* 11/13/2015   TRIG 218* 11/13/2015   CHOLHDL 5.4* 11/13/2015   She has been working on diet and exercise for prediabetes, and denies foot ulcerations, hyperglycemia, hypoglycemia , increased appetite, nausea, paresthesia of the feet, polydipsia, polyuria, visual disturbances, vomiting and weight loss. Last A1C in the office was:  Lab Results  Component Value Date   HGBA1C 6.1* 11/13/2015   Last GFR Lab Results  Component Value Date   GFRNONAA 66 11/13/2015   Lab Results  Component Value Date   GFRAA 77 11/13/2015   Patient is on Vitamin D supplement. Lab Results  Component Value Date   VD25OH 92 08/09/2015     Patient reports that she did have some oral surgery and had a cyst removed.  She reports that she did have cataract surgery and has done well with that.  She is seeing Dr. Randon Goldsmith at  [facility].    She notes that her neck is feeling a lot better.  She reports that she still has issues with her shoulders, but is not having any issues with the neck.  She is seeing Dr. Ollen Bowl as her pain management doctor and is very happy with him.    She notes that she has some trouble with sleeping.  She notes that she some issues falling asleep.  She feels like she can't turn her brain off.  She is off some of her medications.  She reports that she gets up and is tired.  She reports that she is not snoring.  She does take a nap sometimes during the day time.     Medication Review Current Outpatient Prescriptions on File Prior to Visit  Medication Sig Dispense Refill  . aspirin EC 81 MG tablet Take 81 mg by mouth daily.    . bisoprolol-hydrochlorothiazide (ZIAC) 10-6.25 MG tablet TAKE ONE TABLET BY MOUTH ONE TIME DAILY FOR BLOOD PRESSURE 90 tablet 1  . buPROPion (WELLBUTRIN XL) 300 MG 24 hr tablet TAKE 1 TABLET BY MOUTH EVERY MORNING FOR MOOD AND FOCUS ATTENTION 90 tablet 1  . cetirizine (ZYRTEC) 10 MG tablet TAKE 1 TABLET (10 MG TOTAL) BY MOUTH AT BEDTIME.  99  . cyclobenzaprine (FLEXERIL) 5 MG tablet Take 1 tablet (5 mg total) by mouth 2 (two) times daily as needed for muscle spasms. 20 tablet 0  . dexlansoprazole (DEXILANT) 60 MG capsule Take 1 capsule (60 mg total) by mouth daily. 30 capsule 0  . DULoxetine (CYMBALTA) 60 MG capsule TAKE 1 CAPSULE BY MOUTH DAILY. 30 capsule 2  . fenofibrate micronized (LOFIBRA) 134 MG capsule TAKE 1 CAPSULE BY MOUTH DAILY BEFORE BREAKFAST 30 capsule 0  . fluticasone (FLONASE) 50 MCG/ACT nasal spray USE TWO SPRAYS IN EACH NOSTRIL DAILY AS NEEDED 16 g 99  . gabapentin (NEURONTIN) 300 MG capsule Take 1 capsule (300 mg total) by mouth 3 (three) times daily. 60 capsule 0  . levothyroxine (SYNTHROID, LEVOTHROID) 100 MCG tablet TAKE 1 TABLET BY MOUTH ON TUES, WED, THURS, SAT AND SUN. TAKE 1& 1/2 TABLET ON MON AND FRI 102 tablet 3  . Linaclotide  (LINZESS) 290 MCG CAPS capsule Take 290 mcg by mouth daily.    [name] oxybutynin (DITROPAN) 5 MG tablet take 1 tablet two times a day 180 tablet PRN  . polyethylene glycol (MIRALAX / GLYCOLAX) packet Take 17 g by mouth daily as needed for mild constipation. 14 each 0  . ranitidine (ZANTAC) 150 MG tablet Take 150 mg by mouth at bedtime.    [name] rOPINIRole (REQUIP) 2 MG tablet Take 1 tablet 4 x day for Restless Leg Syndrome 360 tablet 1  . Vitamin D, Ergocalciferol, (DRISDOL) 50000 UNITS CAPS capsule Take 1 capsule (50,000 Units total) by mouth daily. (Patient taking differently: Take 50,000 Units by mouth 2 (two) times a week. MON & FRI) 30 capsule PRN   No current facility-administered medications on file prior to visit.    Current Problems (verified) Patient Active Problem List   Diagnosis Date Noted  . Exertional dyspnea 11/08/2015  . IBS (irritable colon syndrome) 08/09/2015  . Intractable pain 06/24/2015  . Spinal stenosis in cervical region 06/14/2015  . Cervical pain 03/06/2015  . Medicare annual wellness visit, subsequent 01/31/2015  . Morbid obesity (BMI 31.88)  10/31/2014  . Medication  management 10/25/2013  . Rotator cuff arthropathy 09/15/2013  . Hyperlipidemia   . T2_NIDDM   . Hypothyroid   . Depression, controlled   . Vitamin D deficiency   . ASCVD (arteriosclerotic cardiovascular disease)   . Gait disorder 10/26/2012  . Rhinitis 06/25/2010  . Obstructive sleep apnea 06/21/2010  . RESTLESS LEG SYNDROME 06/21/2010  . Essential hypertension 06/21/2010  . GERD 06/21/2010  . Crohn's Enterocolitis / Regional enteritis 06/21/2010  . Fibromyalgia 06/21/2010    Screening Tests Immunization History  Administered Date(s) Administered  . DT 01/03/2015  . Influenza Whole 03/30/2010  . Influenza, High Dose Seasonal PF 03/13/2015  . Influenza-Unspecified 03/31/2011, 05/14/2014  . Pneumococcal Conjugate-13 04/17/2015  . Pneumococcal-Unspecified 06/30/2005  . Td 06/30/2004  .  Zoster 07/01/2007    Preventative care: Last colonoscopy: Dr. Kinnie Scales, due 2019 Last mammogram: 2016 DEXA: 2014  Prior vaccinations: TD or Tdap: 2016  Influenza: 2016  Pneumococcal: 2007 Prevnar13: 2016 Shingles/Zostavax: 2009  Names of Other Physician/Practitioners you currently use: 1. [facility]- here for primary care 2. Dr. Randon Goldsmith, eye doctor, last visit 2017 3. Unsure of doctors name , dentist, last visit 2017 Patient Care Team: [name], MD as PCP - General (Internal Medicine) Huston Foley, MD as Attending Physician (Neurology) Sharrell Ku, MD as Consulting Physician (Gastroenterology) Maeola Harman, MD as Consulting Physician (Neurosurgery) Antony Contras, MD as Consulting Physician (Ophthalmology) Campbell Stall, MD as Consulting Physician (Dermatology) Wendee Copp, MD as Referring Physician (Orthopedic Surgery)  Past Surgical History  Procedure Laterality Date  . Knee susrgery    . Shoulder adhesion release    . Spine surgery    . Appendectomy  1960  . Cesarean section  1983 & 1886  . Abdominal hysterectomy  1992  . Rotator cuff repair Left 2005    2 LEFT  1  RIGHT  . Tonsillectomy      T+A  . Tonsillectomy  1960  . Joint replacement      2   LEFT  1 RIGHT   . Toe surgery      LEFT       . Toe surgery      RIGHT  BIG TOE  . Anterior cervical decompression/discectomy fusion 4 levels N/A 06/14/2015    Procedure: Cervical three-four Cervical four-five Cervical five- six Cervical six- seven  Anterior cervical decompression/diskectomy/fusion;  Surgeon: Maeola Harman, MD;  Location: [facility];  Service: Neurosurgery;  Laterality: N/A;  C3-4 C4-5 C5-6 C6-7 Anterior cervical decompression/diskectomy/fusion   Family History  Problem Relation Age of Onset  . Cancer Sister     uterus  . Depression Sister   . Heart disease Father   . Hypertension Father   . Hyperlipidemia Father   . Cancer Mother   . Diabetes Mother    . Osteoporosis Mother   . Leukemia Mother   . Leukemia Sister   . Lupus Sister    Social History  Substance Use Topics  . Smoking status: Never Smoker   . Smokeless tobacco: None  . Alcohol Use: 0.0 oz/week    0 Standard drinks or equivalent per week     Comment: rarely   Allergies  Allergen Reactions  . Atorvastatin Other (See Comments)    Extreme pain  . Codeine Other (See Comments)    Headache  . Pravastatin Other (See Comments)    Exreme pain  . Statins Other (See Comments)    Extreme pain  . Amitriptyline Other (See Comments)  Unspecified  . Fish Oil Nausea Only  . Linzess [Linaclotide] Other (See Comments)    Unspecified  . Nuvigil [Armodafinil] Other (See Comments)    Unspecified  . Erythromycin Other (See Comments)    Reaction unspecified  . Erythromycin Base Rash  . Flax Seed [Bio-Flax] Rash    Linseed  . Hydrocodone-Acetaminophen Rash  . Morphine Nausea And Vomiting and Rash  . Nsaids Nausea Only  . Sertraline Rash  . Sinemet [Carbidopa W-Levodopa] Rash  . Sulfamethoxazole Rash    MEDICARE WELLNESS OBJECTIVES: Tobacco use: She does not smoke.  Patient is not a former smoker. If yes, counseling given Alcohol Current alcohol use: none Diet: well balanced Physical activity: Current Exercise Habits: The patient does not participate in regular exercise at present Cardiac risk factors: Cardiac Risk Factors include: advanced age (>65men, >51 women);diabetes mellitus;dyslipidemia;hypertension;obesity (BMI >30kg/m2) Depression/mood screen:   Depression screen [facility] 2/9 11/13/2015  Decreased Interest 1  Down, Depressed, Hopeless 1  PHQ - 2 Score 2  Altered sleeping -  Tired, decreased energy -  Change in appetite -  Feeling bad or failure about yourself  -  Trouble concentrating -  Moving slowly or fidgety/restless -  Suicidal thoughts -  PHQ-9 Score -    ADLs:  In your present state of health, do you have any difficulty performing the following  activities: 11/13/2015 08/09/2015  Hearing? N N  Vision? N N  Difficulty concentrating or making decisions? N N  Walking or climbing stairs? N N  Dressing or bathing? N N  Doing errands, shopping? N N  Preparing Food and eating ? N -  Using the Toilet? N -  In the past six months, have you accidently leaked urine? N -  Do you have problems with loss of bowel control? N -  Managing your Medications? N -  Managing your Finances? N -  Housekeeping or managing your Housekeeping? N -     Cognitive Testing  Alert? Yes  Normal Appearance?Yes  Oriented to person? Yes  Place? Yes   Time? Yes  Recall of three objects?  Yes  Can perform simple calculations? Yes  Displays appropriate judgment?Yes  Can read the correct time from a watch face?Yes  EOL planning: Does patient have an advance directive?: Yes Type of Advance Directive: Healthcare Power of Attorney, Living will Does patient want to make changes to advanced directive?: No - Patient declined Copy of advanced directive(s) in chart?: No - copy requested   Objective:   Today's Vitals   11/13/15 1100  BP: 134/80  Pulse: 90  Temp: 98.2 F (36.8 C)  TempSrc: Temporal  Resp: 16  Height: 5' 3.25" (1.607 m)  Weight: 171 lb (77.565 kg)   Body mass index is 30.04 kg/(m^2).  General appearance: alert, no distress, WD/WN,  female HEENT: normocephalic, sclerae anicteric, TMs pearly, nares patent, no discharge or erythema, pharynx normal Oral cavity: MMM, no lesions Neck: supple, no lymphadenopathy, no thyromegaly, no masses Heart: RRR, normal S1, S2, no murmurs Lungs: CTA bilaterally, no wheezes, rhonchi, or rales Abdomen: +bs, soft, non tender, non distended, no masses, no hepatomegaly, no splenomegaly Musculoskeletal: nontender, no swelling, no obvious deformity Extremities: no edema, no cyanosis, no clubbing Pulses: 2+ symmetric, upper and lower extremities, normal cap refill Neurological: alert, oriented x 3, CN2-12 intact,  strength normal upper extremities and lower extremities, sensation normal throughout, DTRs 2+ throughout, no cerebellar signs, gait normal Psychiatric: normal affect, behavior normal, pleasant  Breast: defer Gyn: defer Rectal: defer  Medicare Attestation I have personally reviewed: The patient's medical and social history Their use of alcohol, tobacco or illicit drugs Their current medications and supplements The patient's functional ability including ADLs,fall risks, home safety risks, cognitive, and hearing and visual impairment Diet and physical activities Evidence for depression or mood disorders  The patient's weight, height, BMI, and visual acuity have been recorded in the chart.  I have made referrals, counseling, and provided education to the patient based on review of the above and I have provided the patient with a written personalized care plan for preventive services.     Terri Piedra, PA-C   11/14/2015

## 2015-11-14 LAB — TSH: TSH: 2.67 mIU/L

## 2015-11-18 ENCOUNTER — Encounter: Payer: Self-pay | Admitting: *Deleted

## 2015-11-21 ENCOUNTER — Ambulatory Visit (HOSPITAL_COMMUNITY)
Admission: RE | Admit: 2015-11-21 | Discharge: 2015-11-21 | Disposition: A | Discharge status: Home / Self Care | Payer: Medicare Other | Source: Ambulatory Visit | Attending: Pulmonary Disease | Admitting: Pulmonary Disease

## 2015-11-21 DIAGNOSIS — R0600 Dyspnea, unspecified: Secondary | ICD-10-CM | POA: Diagnosis present

## 2015-11-21 LAB — PULMONARY FUNCTION TEST
DL/VA % pred: 85 %
DL/VA: 4.04 ml/min/mmHg/L
DLCO cor % pred: 75 %
DLCO cor: 17.65 ml/min/mmHg
DLCO unc % pred: 77 %
DLCO unc: 18.12 ml/min/mmHg
FEF 25-75 Post: 2.95 L/sec
FEF 25-75 Pre: 2.98 L/sec
FEF2575-%Change-Post: 0 %
FEF2575-%Pred-Post: 148 %
FEF2575-%Pred-Pre: 149 %
FEV1-%Change-Post: -1 %
FEV1-%Pred-Post: 109 %
FEV1-%Pred-Pre: 110 %
FEV1-Post: 2.49 L
FEV1-Pre: 2.51 L
FEV1FVC-%Change-Post: 1 %
FEV1FVC-%Pred-Pre: 110 %
FEV6-%Change-Post: -2 %
FEV6-%Pred-Post: 100 %
FEV6-%Pred-Pre: 103 %
FEV6-Post: 2.88 L
FEV6-Pre: 2.96 L
FEV6FVC-%Pred-Post: 104 %
FEV6FVC-%Pred-Pre: 104 %
FVC-%Change-Post: -2 %
FVC-%Pred-Post: 95 %
FVC-%Pred-Pre: 98 %
FVC-Post: 2.88 L
FVC-Pre: 2.96 L
Post FEV1/FVC ratio: 86 %
Post FEV6/FVC ratio: 100 %
Pre FEV1/FVC ratio: 85 %
Pre FEV6/FVC Ratio: 100 %
RV % pred: 59 %
RV: 1.25 L
TLC % pred: 88 %
TLC: 4.36 L

## 2015-11-21 LAB — BLOOD GAS, ARTERIAL
Acid-base deficit: 1.1 mmol/L (ref 0.0–2.0)
Bicarbonate: 22.4 mEq/L (ref 20.0–24.0)
Drawn by: 103701
FIO2: 0.21
O2 Saturation: 95.6 %
Patient temperature: 98.6
TCO2: 19.6 mmol/L (ref 0–100)
pCO2 arterial: 35.7 mmHg (ref 35.0–45.0)
pH, Arterial: 7.415 (ref 7.350–7.450)
pO2, Arterial: 86.5 mmHg (ref 80.0–100.0)

## 2015-11-21 MED ORDER — ALBUTEROL SULFATE (2.5 MG/3ML) 0.083% IN NEBU
2.5000 mg | INHALATION_SOLUTION | Freq: Once | RESPIRATORY_TRACT | Status: AC
  Administered 2015-11-21: 2.5 mg via RESPIRATORY_TRACT

## 2015-11-22 ENCOUNTER — Telehealth: Payer: Self-pay | Admitting: Pulmonary Disease

## 2015-11-22 NOTE — Telephone Encounter (Signed)
Notes Recorded by Louann Sjogren, MD on 11/21/2015 at 3:48 PM pls tell pt pft was Normal. Plan is to get a home sleep study, treat OSA, get a download. If OSA is controlled, will get ONO and determine if she will still need O2. Has pt had Home sleep study? Thanks. AD  Spoke with the pt and notified of results/recs per AD  She verbalized understanding  HST scheduled for 12/04/15   She is aware of the plan above  Nothing further needed

## 2015-11-29 ENCOUNTER — Other Ambulatory Visit: Payer: Self-pay | Admitting: Internal Medicine

## 2015-12-04 DIAGNOSIS — G4733 Obstructive sleep apnea (adult) (pediatric): Secondary | ICD-10-CM | POA: Diagnosis not present

## 2015-12-06 ENCOUNTER — Telehealth: Payer: Self-pay | Admitting: Pulmonary Disease

## 2015-12-06 DIAGNOSIS — G4733 Obstructive sleep apnea (adult) (pediatric): Secondary | ICD-10-CM | POA: Diagnosis not present

## 2015-12-06 NOTE — Telephone Encounter (Signed)
  Please call the pt and tell the pt the [place]  showed OSA   Pt stops breathing  31  times an hour.   Please order autoCPAP 5-15 cm H2O. Patient will need a mask fitting session. Likely wants a nasal mask. Patient will need a 1 month download.   Patient will also ned an ONO after obtaining good DL > to be ordered on f/u.   Patient needs to be seen by me or any of the NPs/APPs  4-6 weeks after obtaining the cpap machine. Let me know if you receive this.   Thanks!   J. Shirl Harris, MD 12/06/2015, 4:07 PM

## 2015-12-07 ENCOUNTER — Other Ambulatory Visit: Payer: Self-pay | Admitting: *Deleted

## 2015-12-07 DIAGNOSIS — G4733 Obstructive sleep apnea (adult) (pediatric): Secondary | ICD-10-CM

## 2015-12-10 NOTE — Telephone Encounter (Signed)
Spoke with pt and gave results and recommendations. Pt agrees to start CPAP therapy. Order placed. Pt aware to call office and schedule f/u appt once starts CPAP. Nothing further needed.  

## 2015-12-19 ENCOUNTER — Telehealth: Payer: Self-pay | Admitting: Pulmonary Disease

## 2015-12-19 NOTE — Telephone Encounter (Signed)
Spoke with pt, c/o increased sob worse qam X2 weeks.  Pt states she "feels toxic" and compares her feelings to "a hangover".  Denies mucus production, chest pain, fever, chills.  Pt has not taken any medications to help with s/s.  Pt requesting further recs.   [facility] [place] on [address]    Pt also wants to make sure she gets her cpap machine before she goes on a 2 week vacation.  She has already spoken to [facility] about the delivery of her cpap.  I advised that if she has questions about the delivery of her cpap she should contact [facility] about this.  Pt expressed understanding.    AD please advise on recs.  Thanks!

## 2015-12-19 NOTE — Telephone Encounter (Signed)
   I saw this pt for OSA. Incidentally with SOB.  Blood work, PFT and CXR unremarkable.   If she feels "SOB" and "toxic" and "hungover" then maybe she can go to ED.  She can also be seen by her PCP or by one of [place] at the office.  I was planning to do an echo to check her heart so ask her if she wants to have it done.   Thanks.  AD

## 2015-12-19 NOTE — Telephone Encounter (Signed)
Spoke with pt, scheduled acute OV with MR tomorrow at pt's request.  Nothing further needed.

## 2015-12-20 ENCOUNTER — Ambulatory Visit: Payer: Medicare Other | Admitting: Internal Medicine

## 2016-01-07 ENCOUNTER — Telehealth: Payer: Self-pay | Admitting: Pulmonary Disease

## 2016-01-07 NOTE — Telephone Encounter (Signed)
Called [facility] and was advised they have completed everything today and she will be getting a call shortly. LMTCB x1

## 2016-01-08 NOTE — Telephone Encounter (Signed)
Pt aware of cpap recs.  Nothing further needed.

## 2016-01-10 ENCOUNTER — Telehealth: Payer: Self-pay | Admitting: *Deleted

## 2016-01-10 DIAGNOSIS — G4733 Obstructive sleep apnea (adult) (pediatric): Secondary | ICD-10-CM

## 2016-01-10 NOTE — Telephone Encounter (Signed)
A reminder was set in [facility] box for a 1 month download. Order has been placed. Nothing further was needed.

## 2016-01-16 ENCOUNTER — Other Ambulatory Visit: Payer: Self-pay | Admitting: *Deleted

## 2016-01-16 MED ORDER — CYCLOBENZAPRINE HCL 5 MG PO TABS: 5.0000 mg | ORAL_TABLET | Freq: Two times a day (BID) | ORAL | Status: DC | PRN

## 2016-02-01 ENCOUNTER — Ambulatory Visit: Payer: Medicare Other | Admitting: Pulmonary Disease

## 2016-02-01 ENCOUNTER — Other Ambulatory Visit: Payer: Self-pay | Admitting: Physician Assistant

## 2016-02-07 ENCOUNTER — Telehealth: Payer: Self-pay | Admitting: Pulmonary Disease

## 2016-02-07 NOTE — Telephone Encounter (Signed)
LMTCB

## 2016-02-08 NOTE — Telephone Encounter (Signed)
Patient scheduled to see TP on 03/21/16 at 1115.  Patient aware to arrive 15 min early for appointment. Nothing further needed.

## 2016-02-08 NOTE — Telephone Encounter (Signed)
[phone], pt cb

## 2016-02-08 NOTE — Telephone Encounter (Signed)
lmomtcb x1 for pt 

## 2016-02-13 ENCOUNTER — Other Ambulatory Visit: Payer: Self-pay | Admitting: Internal Medicine

## 2016-02-13 DIAGNOSIS — G2581 Restless legs syndrome: Secondary | ICD-10-CM

## 2016-02-14 ENCOUNTER — Other Ambulatory Visit: Payer: Self-pay | Admitting: Internal Medicine

## 2016-02-15 ENCOUNTER — Ambulatory Visit (INDEPENDENT_AMBULATORY_CARE_PROVIDER_SITE_OTHER): Payer: Medicare Other | Admitting: Internal Medicine

## 2016-02-15 ENCOUNTER — Encounter: Payer: Self-pay | Admitting: Internal Medicine

## 2016-02-15 VITALS — BP 132/60 | HR 81 | Temp 97.3°F | Resp 14 | Ht 63.25 in | Wt 174.2 lb

## 2016-02-15 DIAGNOSIS — E559 Vitamin D deficiency, unspecified: Secondary | ICD-10-CM | POA: Diagnosis not present

## 2016-02-15 DIAGNOSIS — K219 Gastro-esophageal reflux disease without esophagitis: Secondary | ICD-10-CM | POA: Diagnosis not present

## 2016-02-15 DIAGNOSIS — E785 Hyperlipidemia, unspecified: Secondary | ICD-10-CM

## 2016-02-15 DIAGNOSIS — E039 Hypothyroidism, unspecified: Secondary | ICD-10-CM | POA: Diagnosis not present

## 2016-02-15 DIAGNOSIS — E119 Type 2 diabetes mellitus without complications: Secondary | ICD-10-CM | POA: Diagnosis not present

## 2016-02-15 DIAGNOSIS — I10 Essential (primary) hypertension: Secondary | ICD-10-CM

## 2016-02-15 DIAGNOSIS — Z79899 Other long term (current) drug therapy: Secondary | ICD-10-CM | POA: Diagnosis not present

## 2016-02-15 LAB — LIPID PANEL
Cholesterol: 171 mg/dL (ref 125–200)
HDL: 50 mg/dL (ref >=46)
LDL Cholesterol: 86 mg/dL (ref <130)
Total CHOL/HDL Ratio: 3.4 Ratio (ref <=5.0)
Triglycerides: 173 mg/dL — ABNORMAL HIGH (ref <150)
VLDL: 35 mg/dL — ABNORMAL HIGH (ref <30)

## 2016-02-15 LAB — HEPATIC FUNCTION PANEL
ALT: 10 U/L (ref 6–29)
AST: 13 U/L (ref 10–35)
Albumin: 4.3 g/dL (ref 3.6–5.1)
Alkaline Phosphatase: 42 U/L (ref 33–130)
Bilirubin, Direct: 0.1 mg/dL (ref <=0.2)
Indirect Bilirubin: 0.3 mg/dL (ref 0.2–1.2)
Total Bilirubin: 0.4 mg/dL (ref 0.2–1.2)
Total Protein: 6.7 g/dL (ref 6.1–8.1)

## 2016-02-15 LAB — CBC WITH DIFFERENTIAL/PLATELET
Basophils Absolute: 67 cells/uL (ref 0–200)
Basophils Relative: 1 %
Eosinophils Absolute: 134 cells/uL (ref 15–500)
Eosinophils Relative: 2 %
HCT: 42.7 % (ref 35.0–45.0)
Hemoglobin: 14.2 g/dL (ref 11.7–15.5)
Lymphocytes Relative: 22 %
Lymphs Abs: 1474 cells/uL (ref 850–3900)
MCH: 30.1 pg (ref 27.0–33.0)
MCHC: 33.3 g/dL (ref 32.0–36.0)
MCV: 90.5 fL (ref 80.0–100.0)
MPV: 8.9 fL (ref 7.5–12.5)
Monocytes Absolute: 536 cells/uL (ref 200–950)
Monocytes Relative: 8 %
Neutro Abs: 4489 cells/uL (ref 1500–7800)
Neutrophils Relative %: 67 %
Platelets: 266 10*3/uL (ref 140–400)
RBC: 4.72 MIL/uL (ref 3.80–5.10)
RDW: 13 % (ref 11.0–15.0)
WBC: 6.7 10*3/uL (ref 3.8–10.8)

## 2016-02-15 LAB — MAGNESIUM: Magnesium: 2 mg/dL (ref 1.5–2.5)

## 2016-02-15 LAB — BASIC METABOLIC PANEL WITH GFR
BUN: 23 mg/dL (ref 7–25)
CO2: 29 mmol/L (ref 20–31)
Calcium: 9.8 mg/dL (ref 8.6–10.4)
Chloride: 103 mmol/L (ref 98–110)
Creat: 0.91 mg/dL (ref 0.50–0.99)
GFR, Est African American: 76 mL/min (ref >=60)
GFR, Est Non African American: 65 mL/min (ref >=60)
Glucose, Bld: 146 mg/dL — ABNORMAL HIGH (ref 65–99)
Potassium: 3.9 mmol/L (ref 3.5–5.3)
Sodium: 140 mmol/L (ref 135–146)

## 2016-02-15 LAB — TSH: TSH: 3.83 mIU/L

## 2016-02-15 LAB — HEMOGLOBIN A1C
Hgb A1c MFr Bld: 6 % — ABNORMAL HIGH (ref <5.7)
Mean Plasma Glucose: 126 mg/dL

## 2016-02-15 NOTE — Progress Notes (Signed)
ADULT & ADOLESCENT INTERNAL MEDICINE                       [name], M.D.        Dyanne Carrel. Steffanie Dunn, P.A.-C       Terri Piedra, P.A.-C   [facility]                [address]                [place], [place]. [address] Telephone [phone] Telefax [phone] ______________________________________________________________________     This very nice 65y.o.[name] presents for 6 month follow up with Hypertension, Hyperlipidemia, Pre-Diabetes and Vitamin D Deficiency. Other problems include OSA on CPAP with improved sleep hygiene, altho patient does report daytime sleepiness which is mosty likely due to her medicines. She has GERD apparently controlled on diet & her meds and finally she has hx/o Crohn's.      Patient is treated for HTN circa 2008 & BP has been controlled at home. Today's BP: 132/60. Patient has had no complaints of any cardiac type chest pain, palpitations, dyspnea/orthopnea/PND, dizziness, claudication, or dependent edema.     Hyperlipidemia is controlled with diet & meds. Patient denies myalgias or other med SE's. Last Lipids were not at goal: Lab Results  Component Value Date   CHOL 171 02/15/2016   HDL 50 02/15/2016   LDLCALC 86 02/15/2016   TRIG 173 (H) 02/15/2016   CHOLHDL 3.4 02/15/2016      Also, the patient has history of T2_NIDDM circa 2012 and has had no symptoms of reactive hypoglycemia, diabetic polys, paresthesias or visual blurring.  Last A1c was not at goal: Lab Results  Component Value Date   HGBA1C 6.1 (H) 11/13/2015      Further, the patient also has history of Vitamin D Deficiency  Of "33" in 2008 and supplements vitamin D without any suspected side-effects. Last vitamin D was at goal: Lab Results  Component Value Date   VD25OH 92 08/09/2015   Current Outpatient Prescriptions on File Prior to Visit  Medication Sig  . aspirin EC 81 MG tablet Take 81 mg by mouth daily.  [name] azelastine (ASTELIN) 0.1  % nasal spray Place 1 spray into both nostrils 2 (two) times daily. Use in each nostril as directed  . bisoprolol-hydrochlorothiazide (ZIAC) 10-6.25 MG tablet TAKE ONE TABLET BY MOUTH ONE TIME DAILY FOR BLOOD PRESSURE  . buPROPion (WELLBUTRIN XL) 300 MG 24 hr tablet TAKE 1 TABLET BY MOUTH EVERY MORNING FOR MOOD AND FOCUS ATTENTION  . cetirizine (ZYRTEC) 10 MG tablet TAKE 1 TAB AT BEDTIME.  . cyclobenzaprine  5 MG tablet Take 1 tab 2 times daily as needed for muscle spasms.  [name] dexlansoprazole 60 MG capsule Take 1 caps daily.  . DULoxetine  60 MG capsule TAKE 1 CAP DAILY.  . fenofibrate 134 MG capsule TAKE 1 CAP DAILY BEFORE BREAKFAST  . FLONASEnasal spray USE TWO SPRAYS IN EACH NOSTRIL DAILY AS NEEDED  . gabapentin (NEURONTIN) 300 MG  TAKE ONE CAP 3 TIMES A DAY  . levothyroxine  100 MCG tablet TAKE 1 TAB ON TWThSS and 1&1/2 TAB -MON / FRI  . Linaclotide (LINZESS) 290 MCG CAPS  Take 290 mcg by mouth daily.  [name] losartan (COZAAR) 50 MG tablet TAKE 1 TAB DAILY.  [name] CORTISPORIN 3.5-10000-1 otic susp Place 4 drops into both ears 4 (four) times daily. X 7 days  . oxybutynin (DITROPAN) 5 MG tablet  take 1 tablet two times a day  . MIRALAX  packet Take 1 daily as needed for mild constipation.  [name]. rOPINIRole (REQUIP) 2 MG  TAKE 1 TABLET 4 TIMES A DAY FOR RESTLESS LEG SYNDROME  . Vitamin D 50,000 UNITS  Take 50,000 Units2 times a week. MON & FRI)   Allergies  Allergen Reactions  . Atorvastatin Other (See Comments)    Extreme pain  . Codeine Other (See Comments)    Headache  . Pravastatin Other (See Comments)    Exreme pain  . Statins Other (See Comments)    Extreme pain  . Amitriptyline Other (See Comments)    Unspecified  . Fish Oil Nausea Only  . Linzess [Linaclotide] Other (See Comments)    Unspecified  . Nuvigil [Armodafinil] Other (See Comments)    Unspecified  . Erythromycin Other (See Comments)    Reaction unspecified  . Erythromycin Base Rash  . Flax Seed [Bio-Flax] Rash    Linseed   . Hydrocodone-Acetaminophen Rash  . Morphine Nausea And Vomiting and Rash  . Nsaids Nausea Only  . Sertraline Rash  . Sinemet [Carbidopa W-Levodopa] Rash  . Sulfamethoxazole Rash   PMHx:   Past Medical History:  Diagnosis Date  . Arthritis   . ASCVD (arteriosclerotic cardiovascular disease)   . Depression   . Fibromyalgia   . Gait disorder 10/26/2012  . GERD (gastroesophageal reflux disease)   . GI bleed   . Hyperlipidemia   . Hypertension   . Hypothyroid   . Restless leg syndrome   . Rhinitis 06/25/2010  . Sleep apnea   . TIA (transient ischemic attack)    3        . Vitamin D deficiency    Immunization History  Administered Date(s) Administered  . DT 01/03/2015  . Influenza Whole 03/30/2010  . Influenza, High Dose Seasonal PF 03/13/2015  . Influenza-Unspecified 03/31/2011, 05/14/2014  . Pneumococcal Conjugate-13 04/17/2015  . Pneumococcal-Unspecified 06/30/2005  . Td 06/30/2004  . Zoster 07/01/2007   Past Surgical History:  Procedure Laterality Date  . ABDOMINAL HYSTERECTOMY  1992  . ANTERIOR CERVICAL DECOMPRESSION/DISCECTOMY FUSION 4 LEVELS N/A 06/14/2015   Procedure: Cervical three-four Cervical four-five Cervical five- six Cervical six- seven  Anterior cervical decompression/diskectomy/fusion;  Surgeon: Maeola HarmanJoseph Stern, MD;  Location: [facility];  Service: Neurosurgery;  Laterality: N/A;  C3-4 C4-5 C5-6 C6-7 Anterior cervical decompression/diskectomy/fusion  . APPENDECTOMY  1960  . CESAREAN SECTION  1983 & 1886  . JOINT REPLACEMENT     2   LEFT  1 RIGHT   . Knee susrgery    . ROTATOR CUFF REPAIR Left 2005   2 LEFT  1  RIGHT  . SHOULDER ADHESION RELEASE    . SPINE SURGERY    . TOE SURGERY     LEFT       . TOE SURGERY     RIGHT  BIG TOE  . TONSILLECTOMY     T+A  . TONSILLECTOMY  1960   FHx:    Reviewed / unchanged  SHx:    Reviewed / unchanged  Systems Review:  Constitutional: Denies fever, chills, wt changes, headaches, insomnia, fatigue, night  sweats, change in appetite. Eyes: Denies redness, blurred vision, diplopia, discharge, itchy, watery eyes.  ENT: Denies discharge, congestion, post nasal drip, epistaxis, sore throat, earache, hearing loss, dental pain, tinnitus, vertigo, sinus pain, snoring.  CV: Denies chest pain, palpitations, irregular heartbeat, syncope, dyspnea, diaphoresis, orthopnea, PND, claudication or edema. Respiratory: denies cough, dyspnea, DOE, pleurisy, hoarseness,  laryngitis, wheezing.  Gastrointestinal: Denies dysphagia, odynophagia, heartburn, reflux, water brash, abdominal pain or cramps, nausea, vomiting, bloating, diarrhea, constipation, hematemesis, melena, hematochezia  or hemorrhoids. Genitourinary: Denies dysuria, frequency, urgency, nocturia, hesitancy, discharge, hematuria or flank pain. Musculoskeletal: Denies arthralgias, myalgias, stiffness, jt. swelling, pain, limping or strain/sprain.  Skin: Denies pruritus, rash, hives, warts, acne, eczema or change in skin lesion(s). Neuro: No weakness, tremor, incoordination, spasms, paresthesia or pain. Psychiatric: Denies confusion, memory loss or sensory loss. Endo: Denies change in weight, skin or hair change.  Heme/Lymph: No excessive bleeding, bruising or enlarged lymph nodes.  Physical Exam  BP 132/60   Pulse 81   Temp 97.3 F (36.3 C)   Resp 14   Ht 5' 3.25" (1.607 m)   Wt 174 lb 3.2 oz (79 kg)   SpO2 98%   BMI 30.61 kg/m   Appears over nourished and in no distress. Rt Hemifacial paresis. Eyes: PERRLA, EOMs, conjunctiva no swelling or erythema. Sinuses: No frontal/maxillary tenderness ENT/Mouth: EAC's clear, TM's nl w/o erythema, bulging. Nares clear w/o erythema, swelling, exudates. Oropharynx clear without erythema or exudates. Oral hygiene is good. Tongue normal, non obstructing. Hearing intact.  Neck: Supple. Thyroid nl. Car 2+/2+ without bruits, nodes or JVD. Chest: Respirations nl with BS clear & equal w/o rales, rhonchi, wheezing or  stridor.  Cor: Heart sounds normal w/ regular rate and rhythm without sig. murmurs, gallops, clicks, or rubs. Peripheral pulses normal and equal  without edema.  Abdomen: Soft & bowel sounds normal. Non-tender w/o guarding, rebound, hernias, masses, or organomegaly.  Lymphatics: Unremarkable.  Musculoskeletal: Full ROM all peripheral extremities, joint stability, 5/5 strength, and normal gait.  Skin: Warm, dry without exposed rashes, lesions or ecchymosis apparent.  Neuro: Cranial nerves intact, reflexes equal bilaterally. Sensory-motor testing grossly intact. Tendon reflexes grossly intact.  Pysch: Alert & oriented x 3.  Insight and judgement nl & appropriate. No ideations.  Assessment and Plan:   1. Essential hypertension  - Continue medication, monitor blood pressure at home. Continue DASH diet. Reminder to go to the ER if any CP, SOB, nausea, dizziness, severe HA, changes vision/speech, left arm numbness and tingling and jaw pain. - TSH  2. Hyperlipidemia  - Continue diet/meds, exercise,& lifestyle modifications. Continue monitor periodic cholesterol/liver & renal functions  - Lipid panel - TSH  3. T2_NIDDM  - Continue diet, exercise, lifestyle modifications. Monitor appropriate labs. - Hemoglobin A1c - Insulin, random  4. Vitamin D deficiency  - Continue supplementation. - VITAMIN D 25 Hydroxy   5. Hypothyroidism  - TSH  6. Gastroesophageal reflux disease   7. Medication management  - CBC with Differential/Platelet - BASIC METABOLIC PANEL WITH GFR - Hepatic function panel - Magnesium      Recommended regular exercise, BP monitoring, weight control, and discussed med and SE's. Recommended labs to assess and monitor clinical status. Further disposition pending results of labs. Over 30 minutes of exam, counseling, chart review was performed

## 2016-02-15 NOTE — Patient Instructions (Signed)

## 2016-02-16 LAB — VITAMIN D 25 HYDROXY (VIT D DEFICIENCY, FRACTURES): Vit D, 25-Hydroxy: 128 ng/mL — ABNORMAL HIGH (ref 30–100)

## 2016-02-16 LAB — INSULIN, RANDOM: Insulin: 51.7 u[IU]/mL — ABNORMAL HIGH (ref 2.0–19.6)

## 2016-02-18 ENCOUNTER — Other Ambulatory Visit: Payer: Self-pay | Admitting: *Deleted

## 2016-02-18 MED ORDER — AZELASTINE HCL 0.1 % NA SOLN: 1.0000 | Freq: Two times a day (BID) | NASAL | 2 refills | Status: DC

## 2016-02-29 ENCOUNTER — Other Ambulatory Visit: Payer: Self-pay | Admitting: *Deleted

## 2016-03-05 ENCOUNTER — Ambulatory Visit: Payer: Medicare Other | Admitting: Pulmonary Disease

## 2016-03-07 ENCOUNTER — Telehealth: Payer: Self-pay | Admitting: Pulmonary Disease

## 2016-03-07 NOTE — Telephone Encounter (Signed)
Per AD, needs OV next week with any MD or if only wants to see AD then it will be 2 weeks from now.  If worsening symptoms, go to ED over the weekend.   Pt wants to schedule appt with any MD on Monday.   Pt scheduled with Dr Annamaria Boots on 03/10/16 at 11:30.  Nothing further needed.

## 2016-03-07 NOTE — Telephone Encounter (Signed)
Pt c/o increased breathing issues x last few days. Some chest tightness at night and with over exertion. Pt states that her body "feels toxic". In the mornings she she feels uncoordinated/disoriented and feels this way when she gets sleepy. Pt having hallucinations/nightmares where she will shout out at night and move around as if she is awake but her husband tells her she will be fully asleep and having what seems to be hallucinations during the night.  Pt states that she has RLS and takes Ropinirole  - seems to not be sleeping well at night. Will send to Dr Christene Slates for rec's Nothing further needed.

## 2016-03-08 ENCOUNTER — Other Ambulatory Visit: Payer: Self-pay | Admitting: Internal Medicine

## 2016-03-10 ENCOUNTER — Encounter: Payer: Self-pay | Admitting: Internal Medicine

## 2016-03-10 ENCOUNTER — Ambulatory Visit (INDEPENDENT_AMBULATORY_CARE_PROVIDER_SITE_OTHER): Payer: Medicare Other | Admitting: Internal Medicine

## 2016-03-10 VITALS — BP 102/62 | HR 88

## 2016-03-10 DIAGNOSIS — G4734 Idiopathic sleep related nonobstructive alveolar hypoventilation: Secondary | ICD-10-CM | POA: Diagnosis not present

## 2016-03-10 DIAGNOSIS — G4733 Obstructive sleep apnea (adult) (pediatric): Secondary | ICD-10-CM | POA: Diagnosis not present

## 2016-03-10 DIAGNOSIS — G2581 Restless legs syndrome: Secondary | ICD-10-CM

## 2016-03-10 NOTE — Patient Instructions (Addendum)
Order- DME [facility] please change CPAP auto range to 8-15, continue mask of choice, humidifier, supplies, AirView  Dx OSA  Order- ONOX on CPAP with room air     Dx sleep hypoxia  Keep appointment with Dr Clayborn Bigness

## 2016-03-10 NOTE — Assessment & Plan Note (Signed)
She describes complex movements of hands in sleep. I doubt seizure. REM Behavior Disorder is possible. I favor partial arousals from sleep under medication. She does not describe sleepwalking or incontinence.

## 2016-03-10 NOTE — Assessment & Plan Note (Signed)
She is describing some discomfort in the back of her head which might be from lying on straps, especially straps are too tight. "Full" feeling in her head may be simply effect of CPAP pressure. I suspect much of the discomfort she describes as "feeling toxic" his effects of the various sedating medications she takes in the evening and bedtime. She has some of that feeling after waking in the morning when she is gone back to bed without CPAP for a couple of hours. Question oxygen status while wearing CPAP. She says CPAP pressure starts to low. Plan-overnight oximetry on CPAP. Change auto Pap range to 8-15. Follow-up with Dr. Murlean Iba.

## 2016-03-10 NOTE — Progress Notes (Signed)
Subjective:    Patient ID: Crystal [name], female    DOB: 1950/12/18, 65 y.o.   MRN: [id number]  HPI  11/08/15- Dr DeDios This is the case of Crystal [name], 65 y.o. Female, who was referred byDid n Dr. Quentin Mulling in consultation regarding OSA.  As you very well know, patient was dxed with OSA in 2006. Had a sleep study, unknown severity. Ended up on cpap 9 cm H2O in 2006.  Felt better using cpap. More energy. Less sleepiness. Did not tolerate mask.  Was switched to Bipap in 2012. Had a rpt study then.  She used bipap from 2012 until 2016. She stopped using it since she could not tolerate the pressure. Has issues with mask as well. Uncomfortable. Best for her was nasal mask.  Pt had a sleep study in 08/2015 where AHI was 4.1. Had sig o2 desatn.  Has RLS controlled with meds.  Patient was dxed with OSA in 2006. Had a sleep study, unknown severity. Ended up on cpap 9 cm H2O in 2006.   Felt better using cpap. More energy. Less sleepiness. Did not tolerate mask.  Was switched to Bipap in 2012. Had a rpt study then.  She used bipap from 2012 until 2016. She stopped using it since she could not tolerate the pressure. Has issues with mask as well. Uncomfortable. Best for her was nasal mask.  Pt had a sleep study in 08/2015 where AHI was 4.1. Had sig o2 desatn.  Has RLS controlled with meds.  Pt felt better over all with cpap. Did not tolerate bipap. Plan: 1. HST. If (+) OSA, try autocpap 5-10 cm H2O. Does not tolerate high pressure. Also, needs mask fitting session. She likes nasal mask I think.  2. If with osa, need to correct it and get noc ox on cpap. Pt with sig o2 desatn during 08/2015 PSG.  3. RLS is controlled.  Exertional dyspnea Has SOB with exertion. Non smoker. Sig o2 desatn with PSG. Needs pft, abg, CXR. May need echo.   03/10/2016-65 year old female patient of Dr. Clayborn Bigness followed for OSA, Restless Legs( Neurology), exertional dyspnea, complicated by GERD, Crohn's,  HBP FOLLOW FOR:  Acute Visit-Dr DeDios patient; wakes up every morning feeling "drugged", head feels "numb", she feels like she is "toxic" like she is not getting enough oxygen, unable to breath in enough air.   Unattended Home Sleep Test 12/04/2015-severe OSA-AHI 30.7/hour, desaturating to 73%, body weight 172 pounds CPAP [facility] auto 5-15 PFT 11/21/2015-normal except borderline low DLCO Has had anterior C-spine surgery.   TIA x 3 in past.  Bell's palsy with residual weakness right face. Mainly on days when she has used CPAP, she wakes in the morning with a numb pressure sensation on the back of her head, some high-pitched tinnitus and malaise-"feels toxic" At beginning CPAP pressure is too low, never too high. Bedtime medication-Flexeril, gabapentin at supper 400 mg, Requip Gets up 5:30 or 6 AM to walk dog and take morning meds. By 8:30 AM she may go back to bed without CPAP for a couple of hours, waking feeling "drugged". Talks a lot in her sleep, moves around, moves hands and fingers as if playing piano  ROS-see HPI   Negative unless "+" Constitutional:    weight loss, night sweats, fevers, chills, +fatigue, lassitude. HEENT:    headaches, difficulty swallowing, tooth/dental problems, sore throat,       sneezing, itching, ear ache, nasal congestion, post nasal drip, snoring CV:    chest  pain, orthopnea, PND, swelling in lower extremities, anasarca,                                                    dizziness, palpitations Resp:   shortness of breath with exertion or at rest.                productive cough,   non-productive cough, coughing up of blood.              change in color of mucus.  wheezing.   Skin:    rash or lesions. GI:  No-   heartburn, indigestion, abdominal pain, nausea, vomiting, diarrhea,                 change in bowel habits, loss of appetite GU: dysuria, change in color of urine, no urgency or frequency.   flank pain. MS:   joint pain, stiffness, decreased range of  motion, back pain. Neuro-    + HPI Psych:  change in mood or affect.  depression or anxiety.   memory loss.  OBJ- Physical Exam General- Alert, Oriented, Affect-appropriate, Distress- none acute Skin- rash-none, lesions- none, excoriation- none Lymphadenopathy- none Head- atraumatic            Eyes- Gross vision intact, PERRLA, conjunctivae and secretions clear            Ears- Hearing, canals-normal            Nose- Clear, no-Septal dev, mucus, polyps, erosion, perforation             Throat- Mallampati III , mucosa clear , drainage- none, tonsils- atrophic Neck- flexible , trachea midline, no stridor , thyroid nl, carotid no bruit Chest - symmetrical excursion , unlabored           Heart/CV- RRR , no murmur , no gallop  , no rub, nl s1 s2                           - JVD- none , edema- none, stasis changes- none, varices- none           Lung- clear to P&A, wheeze- none, cough- none , dullness-none, rub- none           Chest wall-  Abd-  Br/ Gen/ Rectal- Not done, not indicated Extrem- cyanosis- none, clubbing, none, atrophy- none, strength- nl Neuro-+slack R lower face (since Bell's Palsey)      Assessment & Plan:                                                                                                                                                                                                                                                                                                                                          ,

## 2016-03-21 ENCOUNTER — Ambulatory Visit: Payer: Medicare Other | Admitting: Adult Health

## 2016-03-24 ENCOUNTER — Other Ambulatory Visit: Payer: Self-pay | Admitting: Internal Medicine

## 2016-03-26 ENCOUNTER — Encounter: Payer: Self-pay | Admitting: Pulmonary Disease

## 2016-03-26 ENCOUNTER — Ambulatory Visit (INDEPENDENT_AMBULATORY_CARE_PROVIDER_SITE_OTHER): Payer: Medicare Other | Admitting: Pulmonary Disease

## 2016-03-26 DIAGNOSIS — G478 Other sleep disorders: Secondary | ICD-10-CM | POA: Insufficient documentation

## 2016-03-26 DIAGNOSIS — R5383 Other fatigue: Secondary | ICD-10-CM | POA: Diagnosis not present

## 2016-03-26 DIAGNOSIS — G4733 Obstructive sleep apnea (adult) (pediatric): Secondary | ICD-10-CM | POA: Diagnosis not present

## 2016-03-26 NOTE — Progress Notes (Signed)
Subjective:    Patient ID: Crystal [name], female    DOB: 1951/06/24, 65 y.o.   MRN: [id number]  HPI  Patient was referred to the office for sleep apnea.   As you very well know, patient was dxed with OSA in 2006. Had a sleep study, unknown severity. Ended up on cpap 9 cm H2O in 2006.   Felt better using cpap. More energy. Less sleepiness. Did not tolerate mask.  Was switched to Bipap in 2012. Had a rpt study then.  She used bipap from 2012 until 2016. She stopped using it since she could not tolerate the pressure. Has issues with mask as well. Uncomfortable. Best for her was nasal mask.   Pt had a sleep study in 08/2015 where AHI was 4.1. Had sig o2 desatn.    Has RLS controlled with meds.    Non smoker. Not known to have asthma or copd.  Has sinus allergies. Stable off meds.   ROV 03/26/16 Since last seen, she had a home sleep study which showed her AHI was 30. She was started on auto CPAP 5-15 cm water. Download the last month shows 73% compliance, AHI 3.7. She recently saw Dr. Annamaria Boots for "feeling hung over when she wakes up". She felt machine's pressure was not enough so it was increased to autocpap 8-15 cm water >> tolerating it well. She feels better with cpap. Sleeps through the night.   Her main issues now is she feels tired in the morning. She is tired all day. Tiredness a little better with cpap. Has been tired x 1 yr. ESS is 2.    Pt sleep talks since starting the cpap. Occasional acting out of dreams. Not too frequent. Happened 2x since being on cpap.     Past Medical History:  Diagnosis Date  . Arthritis   . ASCVD (arteriosclerotic cardiovascular disease)   . Depression   . Fibromyalgia   . Gait disorder 10/26/2012  . GERD (gastroesophageal reflux disease)   . GI bleed   . Hyperlipidemia   . Hypertension   . Hypothyroid   . Restless leg syndrome   . Rhinitis 06/25/2010  . Sleep apnea   . TIA (transient ischemic attack)    3        . Vitamin D deficiency      (-) CA, DVT  Family History  Problem Relation Age of Onset  . Cancer Sister     uterus  . Depression Sister   . Heart disease Father   . Hypertension Father   . Hyperlipidemia Father   . Cancer Mother   . Diabetes Mother   . Osteoporosis Mother   . Leukemia Mother   . Leukemia Sister   . Lupus Sister      Past Surgical History:  Procedure Laterality Date  . ABDOMINAL HYSTERECTOMY  1992  . ANTERIOR CERVICAL DECOMPRESSION/DISCECTOMY FUSION 4 LEVELS N/A 06/14/2015   Procedure: Cervical three-four Cervical four-five Cervical five- six Cervical six- seven  Anterior cervical decompression/diskectomy/fusion;  Surgeon: Erline Levine, MD;  Location: [facility];  Service: Neurosurgery;  Laterality: N/A;  C3-4 C4-5 C5-6 C6-7 Anterior cervical decompression/diskectomy/fusion  . APPENDECTOMY  1960  . [place]  . JOINT REPLACEMENT     2   LEFT  1 RIGHT   . Knee susrgery    . ROTATOR CUFF REPAIR Left 2005   2 LEFT  1  RIGHT  . SHOULDER ADHESION RELEASE    . SPINE  SURGERY    . TOE SURGERY     LEFT       . TOE SURGERY     RIGHT  BIG TOE  . TONSILLECTOMY     T+A  . TONSILLECTOMY  1960    Social History   Social History  . Marital status: Married    Spouse name: N/A  . Number of children: 2  . Years of education: N/A   Occupational History  . Retired Education officer, museum Disabled   Social History Main Topics  . Smoking status: Never Smoker  . Smokeless tobacco: Not on file  . Alcohol use 0.0 oz/week     Comment: rarely  . Drug use: No  . Sexual activity: Not on file   Other Topics Concern  . Not on file   Social History Narrative   Drinks about 1 cup of coffee a day, occasional coke zero     Was a Pharmacist, hospital.   Allergies  Allergen Reactions  . Atorvastatin Other (See Comments)    Extreme pain  . Codeine Other (See Comments)    Headache  . Pravastatin Other (See Comments)    Exreme pain  . Statins Other (See Comments)    Extreme pain  .  Amitriptyline Other (See Comments)    Unspecified  . Fish Oil Nausea Only  . Linzess [Linaclotide] Other (See Comments)    Unspecified  . Nuvigil [Armodafinil] Other (See Comments)    Unspecified  . Erythromycin Other (See Comments)    Reaction unspecified  . Erythromycin Base Rash  . Flax Seed [Bio-Flax] Rash    Linseed  . Hydrocodone-Acetaminophen Rash  . Morphine Nausea And Vomiting and Rash  . Nsaids Nausea Only  . Sertraline Rash  . Sinemet [Carbidopa W-Levodopa] Rash  . Sulfamethoxazole Rash     Outpatient Medications Prior to Visit  Medication Sig Dispense Refill  . aspirin EC 81 MG tablet Take 81 mg by mouth daily.    [name] azelastine (ASTELIN) 0.1 % nasal spray Place 1 spray into both nostrils 2 (two) times daily. Use in each nostril as directed 30 mL 2  . bisoprolol-hydrochlorothiazide (ZIAC) 10-6.25 MG tablet TAKE ONE TABLET BY MOUTH ONE TIME DAILY FOR BLOOD PRESSURE 90 tablet 1  . buPROPion (WELLBUTRIN XL) 300 MG 24 hr tablet TAKE 1 TABLET BY MOUTH EVERY MORNING FOR MOOD AND FOCUS ATTENTION 90 tablet 1  . cetirizine (ZYRTEC) 10 MG tablet TAKE 1 TABLET (10 MG TOTAL) BY MOUTH AT BEDTIME.  99  . cyclobenzaprine (FLEXERIL) 5 MG tablet Take 1 tablet (5 mg total) by mouth 2 (two) times daily as needed for muscle spasms. 90 tablet 1  . dexlansoprazole (DEXILANT) 60 MG capsule Take 1 capsule (60 mg total) by mouth daily. 30 capsule 0  . DULoxetine (CYMBALTA) 60 MG capsule TAKE 1 CAPSULE BY MOUTH DAILY. 90 capsule 1  . fenofibrate micronized (LOFIBRA) 134 MG capsule TAKE 1 CAPSULE BY MOUTH DAILY BEFORE BREAKFAST 90 capsule 3  . fluticasone (FLONASE) 50 MCG/ACT nasal spray USE TWO SPRAYS IN EACH NOSTRIL DAILY AS NEEDED 16 g 99  . gabapentin (NEURONTIN) 300 MG capsule TAKE ONE CAPSULE BY MOUTH 3 TIMES A DAY 60 capsule 0  . levothyroxine (SYNTHROID, LEVOTHROID) 100 MCG tablet TAKE 1 TABLET BY MOUTH ON TUES, WED, THURS, SAT AND SUN. TAKE 1& 1/2 TABLET ON MON AND FRI 102 tablet 3  .  Linaclotide (LINZESS) 290 MCG CAPS capsule Take 290 mcg by mouth daily.    [name] losartan (COZAAR)  50 MG tablet TAKE 1 TABLET (50 MG TOTAL) BY MOUTH DAILY. 90 tablet 0  . neomycin-polymyxin-hydrocortisone (CORTISPORIN) 3.5-10000-1 otic suspension Place 4 drops into both ears 4 (four) times daily. X 7 days 10 mL 1  . oxybutynin (DITROPAN) 5 MG tablet take 1 tablet two times a day 180 tablet PRN  . polyethylene glycol (MIRALAX / GLYCOLAX) packet Take 17 g by mouth daily as needed for mild constipation. 14 each 0  . rOPINIRole (REQUIP) 2 MG tablet TAKE 1 TABLET 4 TIMES A DAY FOR RESTLESS LEG SYNDROME 360 tablet 1  . Vitamin D, Ergocalciferol, (DRISDOL) 50000 UNITS CAPS capsule Take 1 capsule (50,000 Units total) by mouth daily. (Patient taking differently: Take 50,000 Units by mouth 2 (two) times a week. MON & FRI) 30 capsule PRN   No facility-administered medications prior to visit.    No orders of the defined types were placed in this encounter.       Review of Systems  Constitutional: Negative.  Negative for fever and unexpected weight change.  HENT: Positive for congestion, ear pain and sinus pressure. Negative for dental problem, nosebleeds, postnasal drip, rhinorrhea, sneezing, sore throat and trouble swallowing.   Eyes: Positive for redness and visual disturbance. Negative for itching.  Respiratory: Positive for shortness of breath. Negative for cough, chest tightness and wheezing.   Cardiovascular: Negative.  Negative for palpitations and leg swelling.  Gastrointestinal: Negative.  Negative for nausea and vomiting.  Endocrine: Negative.   Genitourinary: Negative.  Negative for dysuria.  Musculoskeletal: Positive for arthralgias, back pain, joint swelling and neck pain.  Skin: Negative.  Negative for rash.  Allergic/Immunologic: Positive for environmental allergies.  Neurological: Positive for dizziness, light-headedness and headaches.  Hematological: Does not bruise/bleed easily.    Psychiatric/Behavioral: Negative.  Negative for dysphoric mood. The patient is not nervous/anxious.        Objective:   Physical Exam   Vitals:  Vitals:   03/26/16 1056  BP: 122/72  Pulse: 83  SpO2: 92%  Weight: 79.8 kg (176 lb)  Height: 5' 3.25" (1.607 m)    Constitutional/General:  Pleasant, well-nourished, well-developed, not in any distress,  Comfortably seating.  Well kempt  Body mass index is 30.93 kg/m. Wt Readings from Last 3 Encounters:  03/26/16 79.8 kg (176 lb)  02/15/16 79 kg (174 lb 3.2 oz)  11/13/15 77.6 kg (171 lb)      HEENT: Pupils equal and reactive to light and accommodation. Anicteric sclerae. Normal nasal mucosa.   No oral  lesions,  mouth clear,  oropharynx clear, no postnasal drip. (-) Oral thrush. No dental caries.  Airway - Mallampati class III  Neck: No masses. Midline trachea. No JVD, (-) LAD. (-) bruits appreciated.  Respiratory/Chest: Grossly normal chest. (-) deformity. (-) Accessory muscle use.  Symmetric expansion. (-) Tenderness on palpation.  Resonant on percussion.  Diminished BS on both lower lung zones. (-) wheezing, crackles, rhonchi (-) egophony  Cardiovascular: Regular rate and  rhythm, heart sounds normal, no murmur or gallops, no peripheral edema  Gastrointestinal:  Normal bowel sounds. Soft, non-tender. No hepatosplenomegaly.  (-) masses.   Musculoskeletal:  Normal muscle tone. Normal gait.   Extremities: Grossly normal. (-) clubbing, cyanosis.  (-) edema  Skin: (-) rash,lesions seen.   Neurological/Psychiatric : alert, oriented to time, place, person. Normal mood and affect           Assessment & Plan:  Obstructive sleep apnea Patient was dxed with OSA in 2006. Had a sleep study, unknown severity.  Ended up on cpap 9 cm H2O in 2006.   Felt better using cpap. More energy. Less sleepiness. Did not tolerate mask.  Was switched to Bipap in 2012. Had a rpt study then.  She used bipap from 2012 until  2016. She stopped using it since she could not tolerate the pressure. Has issues with mask as well. Uncomfortable. Best for her was nasal mask.   Pt had a sleep study in 08/2015 where AHI was 4.1. Had sig o2 desatn.  Patient had a rpt  sleep study (home) in 11/2015 and AHI was 31.  Has RLS controlled with meds.   Was placed on auto CPAP 5-15 centimeters water. Felt pressure was not enough so it was switched to 8-15 cm water. (02/2016)  Download the last 1 month (02/2016) : 73%, AHI 3.7 Overall, she feels better with CPAP therapy. Less sleepiness. However, her tiredness and fatigue are lingering. (pls see separate discussion). Feels benefit of CPAP. Able to sleep longer and sleep through the night. Less hypersomnia.  She has been sleep talking couple times since starting on the CPAP. She also has acted out her dreams twice since being on CPAP. She has been dreaming more as being on the CPAP. She asked me a way to find out whether she is having deeper sleep. I mentioned the fact that since she is dreaming more and acting out during her sleep is indirectly telling me she is having REM rebound. If she is having more REM sleep, most likely she is having stage III or deeper sleep.  Fatigue This is the main concern of the patient today. She states she is still tired. She has been tired the last year or so, maybe a little better since starting CPAP therapy. She does not think she is sleepy. Her ESS was 2.   I think at the end of the day, her tiredness/fatigue is multifactorial: 1. Related to severe sleep apnea which is optimally controlled with her auto CPAP.  2. Related to insufficient sleep. She usually sleeps 8-10 hours per night and she will feel better with this. Lately, she's been traveling because of family. She would've interrupted sleep and insufficient sleep. She is more tired when she is not able to sleep at least 8 hours per night. I reiterated the fact that she should try to get at least 8 hours  of sleep so she will feel better. I also mentioned that, through time, our requirement for adequate sleep lessons. I don't want her to force herself sleeping more than 8 hours if 8 hours will be enough.  3. Related to medicines which are sedating. She takes: Flexeril at bedtime, Neurontin twice a day, Cymbalta in the morning. She takes Wellbutrin in the morning which theoretically is a stimulant but can have a different effect on her. Unfortunately, patient has fibromyalgia, chronic pain, depression. All these medical conditions and the medicines mentioned will make her fatigued and tired. I mentioned to her, she needs to have a good cocktail of medications. Her medicines might need to be tweaked so she does not have a lot of sedating meds in the morning. However, we need to make sure that her pain is adequately controlled. I mentioned to her she should follow-up with her regular doctor. May be a pain specialist will also help. May be alternative  therapy such as acupuncture might also help her with her medical condition. She'll touch base with her regular doctor.  4. Patient had recent blood work in August  2017. No evidence of anemia, renal failure, thyroid issues which can theoretically make her fatigued.  Sleep talking Recent sleep talking and acting out of dreams since starting CPAP therapy in June/2017. Likely related to REM rebound.  Not too frequent. Will observe for now. I would avoid starting her on another medicine as she is ready taking a lot of medicines. Discussed with her bedroom safety. Try to keep bedroom safe, no sharp objects, keep the door and windows locked.  I spent at least 25 minutes with the patient today and more than 50% was spent counseling the patient regarding disease and management and facilitating labs and medications.             Patient will follow up with me in 6 mos.     Monica Becton, MD 03/26/2016   12:47 PM [facility] [place] Pager: [phone] Office: [phone], Fax: [phone]                                                                                                                                                                                                                                                                                                                                     ,

## 2016-03-26 NOTE — Patient Instructions (Signed)
   It was a pleasure taking care of you today!  Continue using your CPAP machine. You are on autocpap 8-15 cm water.   Please make sure you use your CPAP device everytime you sleep.  We will monitor the usage of your machine per your insurance requirement.  Your insurance company may take the machine from you if you are not using it regularly.   Please clean the mask, tubings, filter, water reservoir with soapy water every week.  Please use distilled water for the water reservoir.   Please call the office or your machine provider (DME company) if you are having issues with the device.   We will follow-up on the oxygen test which was ordered.  Return to clinic in 6 months.

## 2016-03-26 NOTE — Assessment & Plan Note (Signed)
This is the main concern of the patient today. She states she is still tired. She has been tired the last year or so, maybe a little better since starting CPAP therapy. She does not think she is sleepy. Her ESS was 2.   I think at the end of the day, her tiredness/fatigue is multifactorial: 1. Related to severe sleep apnea which is optimally controlled with her auto CPAP.  2. Related to insufficient sleep. She usually sleeps 8-10 hours per night and she will feel better with this. Lately, she's been traveling because of family. She would've interrupted sleep and insufficient sleep. She is more tired when she is not able to sleep at least 8 hours per night. I reiterated the fact that she should try to get at least 8 hours of sleep so she will feel better. I also mentioned that, through time, our requirement for adequate sleep lessons. I don't want her to force herself sleeping more than 8 hours if 8 hours will be enough.  3. Related to medicines which are sedating. She takes: Flexeril at bedtime, Neurontin twice a day, Cymbalta in the morning. She takes Wellbutrin in the morning which theoretically is a stimulant but can have a different effect on her. Unfortunately, patient has fibromyalgia, chronic pain, depression. All these medical conditions and the medicines mentioned will make her fatigued and tired. I mentioned to her, she needs to have a good cocktail of medications. Her medicines might need to be tweaked so she does not have a lot of sedating meds in the morning. However, we need to make sure that her pain is adequately controlled. I mentioned to her she should follow-up with her regular doctor. May be a pain specialist will also help. May be alternative  therapy such as acupuncture might also help her with her medical condition. She'll touch base with her regular doctor.  4. Patient had recent blood work in August 2017. No evidence of anemia, renal failure, thyroid issues which can theoretically  make her fatigued.

## 2016-03-26 NOTE — Assessment & Plan Note (Signed)
Recent sleep talking and acting out of dreams since starting CPAP therapy in June/2017. Likely related to REM rebound.  Not too frequent. Will observe for now. I would avoid starting her on another medicine as she is ready taking a lot of medicines. Discussed with her bedroom safety. Try to keep bedroom safe, no sharp objects, keep the door and windows locked.

## 2016-03-26 NOTE — Assessment & Plan Note (Addendum)
Patient was dxed with OSA in 2006. Had a sleep study, unknown severity. Ended up on cpap 9 cm H2O in 2006.   Felt better using cpap. More energy. Less sleepiness. Did not tolerate mask.  Was switched to Bipap in 2012. Had a rpt study then.  She used bipap from 2012 until 2016. She stopped using it since she could not tolerate the pressure. Has issues with mask as well. Uncomfortable. Best for her was nasal mask.   Pt had a sleep study in 08/2015 where AHI was 4.1. Had sig o2 desatn.  Patient had a rpt  sleep study (home) in 11/2015 and AHI was 31.  Has RLS controlled with meds.   Was placed on auto CPAP 5-15 centimeters water. Felt pressure was not enough so it was switched to 8-15 cm water. (02/2016)  Download the last 1 month (02/2016) : 73%, AHI 3.7 Overall, she feels better with CPAP therapy. Less sleepiness. However, her tiredness and fatigue are lingering. (pls see separate discussion). Feels benefit of CPAP. Able to sleep longer and sleep through the night. Less hypersomnia.  She has been sleep talking couple times since starting on the CPAP. She also has acted out her dreams twice since being on CPAP. She has been dreaming more as being on the CPAP. She asked me a way to find out whether she is having deeper sleep. I mentioned the fact that since she is dreaming more and acting out during her sleep is indirectly telling me she is having REM rebound. If she is having more REM sleep, most likely she is having stage III or deeper sleep.

## 2016-03-31 ENCOUNTER — Other Ambulatory Visit: Payer: Self-pay | Admitting: Internal Medicine

## 2016-04-02 ENCOUNTER — Ambulatory Visit (INDEPENDENT_AMBULATORY_CARE_PROVIDER_SITE_OTHER): Payer: Medicare Other | Admitting: Internal Medicine

## 2016-04-02 ENCOUNTER — Encounter: Payer: Self-pay | Admitting: Internal Medicine

## 2016-04-02 VITALS — BP 124/80 | HR 88 | Temp 97.6°F | Resp 16 | Ht 63.25 in | Wt 172.2 lb

## 2016-04-02 DIAGNOSIS — M797 Fibromyalgia: Secondary | ICD-10-CM

## 2016-04-02 MED ORDER — DULOXETINE HCL 60 MG PO CPEP: ORAL_CAPSULE | ORAL | 1 refills | Status: DC

## 2016-04-02 NOTE — Progress Notes (Signed)
[facility]   [name], M.D.    Dyanne Carrel. Steffanie Dunn, P.A.-C      Terri Piedra, P.A.-C  [facility]                [address]                [place], [place]. [address] Telephone [phone] Telefax [phone] Subjective:    Patient ID: Crystal [name], female    DOB: [birth year], 65 y.o.   MRN: [id number]  HPI  Patient is a very nice but unfortunate [name] with multiple debilitating co-morbidities. Today she relates her pains attributed to her Fibromyalgia not controled meds. She also has ongoing orthopedic neck and shoulder pains and has upcoming appt with Dr Rennis Chris. She has been on Cymbalta 60 mg qd snd is also on Gabapentin 800 mg x 1/2 tab 2 x/da to transition to 300 mg tid. Other issues discussed Depression/Grief from related to not too distant untimely loss of her son in [place].   Medication Sig  . acyclovir (ZOVIRAX) 800 MG tablet TAKE ONE TABLET BY MOUTH THREE TIMES DAILY WITH MEALS AND 2 NIGHTLY AT BEDTIME.  [name] aspirin EC 81 MG tablet Take 81 mg by mouth daily.  [name] azelastine (ASTELIN) 0.1 % nasal spray Place 1 spray into both nostrils 2 (two) times daily. Use in each nostril as directed  . bisoprolol-hydrochlorothiazide (ZIAC) 10-6.25 MG tablet TAKE ONE TABLET BY MOUTH ONE TIME DAILY FOR BLOOD PRESSURE  . buPROPion (WELLBUTRIN XL) 300 MG 24 hr tablet TAKE 1 TABLET BY MOUTH EVERY MORNING FOR MOOD AND FOCUS ATTENTION  . cyclobenzaprine (FLEXERIL) 5 MG tablet Take 1 tablet (5 mg total) by mouth 2 (two) times daily as needed for muscle spasms.  [name] dexlansoprazole (DEXILANT) 60 MG capsule Take 1 capsule (60 mg total) by mouth daily.  . fenofibrate micronized (LOFIBRA) 134 MG capsule TAKE 1 CAPSULE BY MOUTH DAILY BEFORE BREAKFAST  . fluticasone (FLONASE) 50 MCG/ACT nasal spray USE TWO SPRAYS IN EACH NOSTRIL DAILY AS NEEDED  . gabapentin (NEURONTIN) 300 MG capsule TAKE ONE CAPSULE BY MOUTH 3 TIMES A DAY  .  levothyroxine (SYNTHROID, LEVOTHROID) 100 MCG tablet TAKE 1 TABLET BY MOUTH ON TUES, WED, THURS, SAT AND SUN. TAKE 1& 1/2 TABLET ON MON AND FRI  . Linaclotide (LINZESS) 290 MCG CAPS capsule Take 290 mcg by mouth daily.  [name] losartan (COZAAR) 50 MG tablet TAKE 1 TABLET (50 MG TOTAL) BY MOUTH DAILY.  [name] neomycin-polymyxin-hydrocortisone (CORTISPORIN) 3.5-10000-1 otic suspension Place 4 drops into both ears 4 (four) times daily. X 7 days  . oxybutynin (DITROPAN) 5 MG tablet take 1 tablet two times a day  . polyethylene glycol (MIRALAX / GLYCOLAX) packet Take 17 g by mouth daily as needed for mild constipation.  [name] rOPINIRole (REQUIP) 2 MG tablet TAKE 1 TABLET 4 TIMES A DAY FOR RESTLESS LEG SYNDROME  . Vitamin D, Ergocalciferol, (DRISDOL) 50000 UNITS CAPS capsule Take 1 capsule (50,000 Units total) by mouth daily. (Patient taking differently: Take 50,000 Units by mouth 2 (two) times a week. MON & FRI)  . DULoxetine (CYMBALTA) 60 MG capsule TAKE 1 CAPSULE BY MOUTH DAILY.  . cetirizine (ZYRTEC) 10 MG tablet TAKE 1 TABLET (10 MG TOTAL) BY MOUTH AT BEDTIME.   Allergies  Allergen Reactions  . Atorvastatin Other (See Comments)    Extreme pain  . Codeine Other (See Comments)    Headache  . Pravastatin Other (See Comments)  Exreme pain  . Statins Other (See Comments)    Extreme pain  . Amitriptyline Other (See Comments)    Unspecified  . Fish Oil Nausea Only  . Linzess [Linaclotide] Other (See Comments)    Unspecified  . Nuvigil [Armodafinil] Other (See Comments)    Unspecified  . Erythromycin Other (See Comments)    Reaction unspecified  . Erythromycin Base Rash  . Flax Seed [Bio-Flax] Rash    Linseed  . Hydrocodone-Acetaminophen Rash  . Morphine Nausea And Vomiting and Rash  . Nsaids Nausea Only  . Sertraline Rash  . Sinemet [Carbidopa W-Levodopa] Rash  . Sulfamethoxazole Rash   Past Medical History:  Diagnosis Date  . Arthritis   . ASCVD (arteriosclerotic cardiovascular disease)   .  Depression   . Fibromyalgia   . Gait disorder 10/26/2012  . GERD (gastroesophageal reflux disease)   . GI bleed   . Hyperlipidemia   . Hypertension   . Hypothyroid   . Restless leg syndrome   . Rhinitis 06/25/2010  . Sleep apnea   . TIA (transient ischemic attack)    3        . Vitamin D deficiency    Past Surgical History:  Procedure Laterality Date  . ABDOMINAL HYSTERECTOMY  1992  . ANTERIOR CERVICAL DECOMPRESSION/DISCECTOMY FUSION 4 LEVELS N/A 06/14/2015   Procedure: Cervical three-four Cervical four-five Cervical five- six Cervical six- seven  Anterior cervical decompression/diskectomy/fusion;  Surgeon: Maeola HarmanJoseph Stern, MD;  Location: [facility];  Service: Neurosurgery;  Laterality: N/A;  C3-4 C4-5 C5-6 C6-7 Anterior cervical decompression/diskectomy/fusion  . APPENDECTOMY  1960  . CESAREAN SECTION  1983 & 1886  . JOINT REPLACEMENT     2   LEFT  1 RIGHT   . Knee susrgery    . ROTATOR CUFF REPAIR Left 2005   2 LEFT  1  RIGHT  . SHOULDER ADHESION RELEASE    . SPINE SURGERY    . TOE SURGERY     LEFT       . TOE SURGERY     RIGHT  BIG TOE  . TONSILLECTOMY     T+A  . TONSILLECTOMY  1960   Review of Systems  10 point systems review negative except as above.    Objective:   Physical Exam  BP 124/80   Pulse 88   Temp 97.6 F (36.4 C)   Resp 16   Ht 5' 3.25" (1.607 m)   Wt 172 lb 3.2 oz (78.1 kg)   BMI 30.26 kg/m   Skin Neg exposed. In no acute distress.  HEENT - Eac's patent. TM's Nl. EOM's full. PERRLA. NasoOroPharynx clear. Neck - supple. Nl Thyroid. Carotids 2+ & No bruits, nodes, JVD Chest - Clear equal BS w/o Rales, rhonchi, wheezes. Cor - Nl HS. RRR w/o sig MGR. PP 1(+). No edema. MS- FROM w/o deformities. Muscle power, tone and bulk Nl. Gait Nl. Neuro -Rt hemifacial paresis from remote Bell's palsy.  Sensory, motor and Cerebellar functions otherwise appear Nl w/o focal abnormalities. Psyche - Flat depressed affect.     Assessment & Plan:   1.  Fibromyalgia  Increase dose  - DULoxetine (CYMBALTA) 60 MG capsule; Take 1 capsule 2 x/day for pain.  Dispense: 180 capsule; Refill: 1  etterh

## 2016-04-15 ENCOUNTER — Telehealth: Payer: Self-pay | Admitting: Pulmonary Disease

## 2016-04-15 ENCOUNTER — Ambulatory Visit (INDEPENDENT_AMBULATORY_CARE_PROVIDER_SITE_OTHER): Payer: Medicare Other | Admitting: Internal Medicine

## 2016-04-15 ENCOUNTER — Encounter: Payer: Self-pay | Admitting: Internal Medicine

## 2016-04-15 VITALS — BP 138/76 | HR 80 | Temp 98.0°F | Resp 18 | Ht 63.25 in

## 2016-04-15 DIAGNOSIS — J302 Other seasonal allergic rhinitis: Secondary | ICD-10-CM

## 2016-04-15 MED ORDER — AZITHROMYCIN 250 MG PO TABS: ORAL_TABLET | ORAL | 0 refills | Status: DC

## 2016-04-15 MED ORDER — DEXAMETHASONE SODIUM PHOSPHATE 10 MG/ML IJ SOLN
10.0000 mg | Freq: Once | INTRAMUSCULAR | Status: AC
  Administered 2016-04-15: 10 mg via INTRAMUSCULAR

## 2016-04-15 MED ORDER — CIPROFLOXACIN HCL 0.3 % OP SOLN: 1.0000 [drp] | OPHTHALMIC | 0 refills | Status: DC

## 2016-04-15 NOTE — Telephone Encounter (Signed)
Patient calling to get results from her ONO done a week ago.  Do not see in patient's chart, cannot locate in Dr. Christene Slatese Dios' folder or Dr. Roxy CedarYoung's folder/cart.  Called [facility] to get a copy of the ONO faxed to us.  Spoke to Campbell Soupiffany at [facility] and she will fax report.  Awaiting fax.

## 2016-04-15 NOTE — Patient Instructions (Signed)
Please start using the astelin 2 sprays per nostril in the mornings and right before bedtime.  Please continue to use flonase 2 sprays per nostril right before bedtime.  Please use zyrtec, allegra, or claritin daily.  Please continue to rinse your nose out with saline as often as you can tolerate.    Please use cipro eye drops every 4 hours.  Do this for 5 days.   Please don't use the zpak unless you develop fevers.

## 2016-04-15 NOTE — Telephone Encounter (Addendum)
Accidentally closed encounter: Addended: Received fax of ONO report and asked Dr. Maple HudsonYoung to review in Dr. Christene Slatese Dios' absence.  Dr. Maple HudsonYoung noted that ONO showed desaturation while on CPAP.  Dr. Maple HudsonYoung stated that Dr. Christene Slatese Dios will have to decide based upon this report what he wants to do next and deferred to Dr. Christene Slatese Dios.   Dr. Christene Slatese Dios, please advise. (placed in Dr. Christene Slatese Dios' box for review)

## 2016-04-15 NOTE — Telephone Encounter (Signed)
ONO has been placed in your box for your review when you get to office.

## 2016-04-15 NOTE — Progress Notes (Signed)
HPI  Patient presents to the office for evaluation of eye swelling, facial swelling, and sinus pressure.  It has been going on for 1 weeks.  Patient reports dry, barky cough with no change since her last medication change.  They also endorse change in voice, postnasal drip and sinus congestion, clear fluid, periorbital swelling, burning itching eyes,  mild ear pressure and congestion.  .  They have tried lubricating eye bullets.  She is continuing to use flonase.  She reports that she is not currently using the astelin.  She is not doing claritin, zyrtec, or allegra.   They report that nothing has worked.  They denies other sick contacts.  Review of Systems  Constitutional: Positive for malaise/fatigue. Negative for chills and fever.  HENT: Positive for congestion, ear pain, hearing loss and sore throat.   Eyes: Positive for pain and redness. Negative for blurred vision, double vision and photophobia.  Respiratory: Positive for cough. Negative for sputum production, shortness of breath and wheezing.   Cardiovascular: Negative for chest pain, palpitations and leg swelling.  Neurological: Positive for headaches.    PE:  Vitals:   04/15/16 1551  BP: 138/76  Pulse: 80  Resp: 18  Temp: 98 F (36.7 C)   General:  Alert and non-toxic, WDWN, NAD HEENT: NCAT, PERLA, EOM normal, no occular discharge or erythema.  Nasal mucosal edema with sinus tenderness to palpation.  Oropharynx clear with minimal oropharyngeal edema and erythema.  Mucous membranes moist and pink. Neck:  Cervical adenopathy Chest:  RRR no MRGs.  Lungs clear to auscultation A&P with no wheezes rhonchi or rales.   Abdomen: +BS x 4 quadrants, soft, non-tender, no guarding, rigidity, or rebound. Skin: warm and dry no rash Neuro: A&Ox4, CN II-XII grossly intact  Assessment and Plan:   1. Chronic seasonal allergic rhinitis, unspecified trigger -zyrtec, allegra, or claritin  -cont singulair -cont flonase -start astelin -no  need for abx -eye infection is likely allergic rhinitis - ciprofloxacin (CILOXAN) 0.3 % ophthalmic solution; Place 1 drop into both eyes every 4 (four) hours while awake. Administer 1 drop every 4 hours x 5 days  Dispense: 5 mL; Refill: 0 - dexamethasone (DECADRON) injection 10 mg; Inject 1 mL (10 mg total) into the muscle once.

## 2016-04-15 NOTE — Telephone Encounter (Signed)
   When was the ONO done?  I need to look at the actual report and decide what to do.  How many minutes with O2 satn < 88%?  Thanks.  Monica Becton, MD 04/15/2016, 4:12 PM [place] [facility] Pager [phone] After 3 pm or if no answer, call [phone]

## 2016-04-21 ENCOUNTER — Telehealth: Payer: Self-pay | Admitting: Pulmonary Disease

## 2016-04-21 DIAGNOSIS — G4733 Obstructive sleep apnea (adult) (pediatric): Secondary | ICD-10-CM

## 2016-04-21 NOTE — Telephone Encounter (Signed)
   pls call the pt and tell her her ONO on RA and CPAP was abnormal.  She will need o2 2L with her CPAP. Pls ask the DME if she can get O2 just through the ONO test or if she will need a sleep study with her cpap and prove whether she  needs O2 or not.  (I hope this makes sense).  Thanks.   Monica Becton, MD 04/21/2016, 1:37 PM [place] [facility] Pager [phone] After 3 pm or if no answer, call [phone]

## 2016-04-22 NOTE — Telephone Encounter (Signed)
Spoke with pt. And discuss Dr. Christene Slates message

## 2016-04-23 NOTE — Telephone Encounter (Signed)
Per [place], order was placed to [facility] for O2 to be added to CPAP.  Nothing further needed.

## 2016-04-24 ENCOUNTER — Telehealth: Payer: Self-pay | Admitting: Pulmonary Disease

## 2016-04-24 DIAGNOSIS — G4733 Obstructive sleep apnea (adult) (pediatric): Secondary | ICD-10-CM

## 2016-04-24 NOTE — Telephone Encounter (Signed)
Spoke with Leafy Ro at [facility], wants to know if pt's cpap is managing her 02-Per Leafy Ro, medicare guidelines will require patient to either have an in-office qualifying walk test for oxygen or a titration study showing that she needs oxygen in order for them to provide pt with nocturnal 02.    AD please advise.  Thanks.

## 2016-04-25 NOTE — Telephone Encounter (Signed)
Results have been explained to patient, pt expressed understanding.  Order placed for CPAP titration study. Nothing further needed.

## 2016-04-25 NOTE — Telephone Encounter (Signed)
pls tell pt that medicare wants a sleep study to determine if she needs O2 with her cpap. If she is OK with that, pls order a "CPAP titration study"  Which is a sleep study with pt wearing her own cpap machine and the lab will add/titrate O2 for non apneic hypoxemia and for O2 sats < 88%. Hope this makes sense.  Thanks  J. Shirl Harris, MD 04/25/2016, 8:53 AM [place] [facility] Pager [phone] After 3 pm or if no answer, call [phone]

## 2016-05-02 ENCOUNTER — Ambulatory Visit (HOSPITAL_BASED_OUTPATIENT_CLINIC_OR_DEPARTMENT_OTHER): Payer: Medicare Other | Attending: Pulmonary Disease | Admitting: Pulmonary Disease

## 2016-05-02 DIAGNOSIS — G4733 Obstructive sleep apnea (adult) (pediatric): Secondary | ICD-10-CM

## 2016-05-02 DIAGNOSIS — R0683 Snoring: Secondary | ICD-10-CM | POA: Insufficient documentation

## 2016-05-06 ENCOUNTER — Telehealth: Payer: Self-pay | Admitting: Pulmonary Disease

## 2016-05-06 DIAGNOSIS — G4733 Obstructive sleep apnea (adult) (pediatric): Secondary | ICD-10-CM

## 2016-05-06 NOTE — Procedures (Signed)
NAME: Crystal [name] DATE OF BIRTH:  [birth year] MEDICAL RECORD NUMBER [id number]  LOCATION: [facility]  PHYSICIAN: [place]  DATE OF STUDY: 05/02/2016   CLINICAL INFORMATION  The patient is referred for a CPAP titration to treat sleep apnea. Date of NPSG, Split Night or HST:   SLEEP STUDY TECHNIQUE  As per the AASM Manual for the Scoring of Sleep and Associated Events v2.3 (April 2016) with a hypopnea requiring 4% desaturations.  The channels recorded and monitored were frontal, central and occipital EEG, electrooculogram (EOG), submentalis EMG (chin), nasal and oral airflow, thoracic and abdominal wall motion, anterior tibialis EMG, snore microphone, electrocardiogram, and pulse oximetry. Continuous positive airway pressure (CPAP) was initiated at the beginning of the study and titrated to treat sleep-disordered breathing.  MEDICATIONS  Medications self-administered by patient taken the night of the study : N/A   TECHNICIAN COMMENTS  Comments added by technician: Patient was restless all through the night. NONE  Comments added by scorer: N/A   RESPIRATORY PARAMETERS  Optimal PAP Pressure (cm): 11 AHI at Optimal Pressure (/hr): 0.0  Overall Minimal O2 (%): 87.00 Supine % at Optimal Pressure (%): 8  Minimal O2 at Optimal Pressure (%): 90.0    SLEEP ARCHITECTURE  The study was initiated at 10:33:18 PM and ended at 4:40:47 AM.  Sleep onset time was 22.8 minutes and the sleep efficiency was 80.5%. The total sleep time was 295.7 minutes.  The patient spent 2.71% of the night in stage N1 sleep, 77.85% in stage N2 sleep, 0.00% in stage N3 and 19.44% in REM.Stage REM latency was 274.5 minutes  Wake after sleep onset was 49.0. Alpha intrusion was absent. Supine sleep was 21.21%.  CARDIAC DATA  The 2 lead EKG demonstrated sinus rhythm. The mean heart rate was 71.37 beats per minute. Other EKG findings include: None.   LEG MOVEMENT DATA  The total  Periodic Limb Movements of Sleep (PLMS) were 341. The PLMS index was 69.18. A PLMS index of <15 is considered normal in adults.  IMPRESSIONS  1. No significant o2 desaturation based on this study. The study was done on autocpap and RA to determine if patient will need o2 supplementation as her nocturnal oximetry was abnormal. 2. The optimal PAP pressure was 11 cm of water. 3. Central sleep apnea was not noted during this titration (CAI = 0.0/h). 4. The patient snored with Soft snoring volume during this titration study. 5. No cardiac abnormalities were observed during this study. 6. Severe periodic limb movements were observed during this study. Arousals associated with PLMs were significant.   DIAGNOSIS  1. Obstructive Sleep Apnea (327.23 [G47.33 ICD-10]) 2. No significant O2 desaturation on cpap and room air based on this study.   RECOMMENDATIONS  1. No evidence for significant O2 desaturation based on this study. 2. Continue with CPAP therapy and room air. May try setting CPAP therapy on 11 cm H2O with a Small size Fisher&Paykel Full Face Mask Simplus mask and heated humidification. 3. Avoid alcohol, sedatives and other CNS depressants that may worsen sleep apnea and disrupt normal sleep architecture. 4. Sleep hygiene should be reviewed to assess factors that may improve sleep quality. 5. Weight management and regular exercise should be initiated or continued. 6. Follow up in the office as scheduled.  Monica Becton, MD 05/06/2016, 7:05 AM [place] [facility] Pager [phone] After 3 pm or if no answer, call [phone]

## 2016-05-06 NOTE — Telephone Encounter (Signed)
   pls tell pt the lab sleep study done on cpap did NOT show she needed O2 with her cpap.  Cont with cpap therapy on room air.  pls ask her if she wants to change her cpap pressure based on the study (CPAP 11 cm water). Let me know what she says. Thanks.  Monica Becton, MD 05/06/2016, 7:08 AM [place] [facility] Pager [phone] After 3 pm or if no answer, call [phone]

## 2016-05-07 NOTE — Telephone Encounter (Signed)
pls ask dme to change her settings to cpap 11 cm water. pls order FFM as well. Thanks. pls ask download for 1 month while on cpap 11 cm water.   Monica Becton, MD 05/07/2016, 7:37 PM [place] [facility] Pager [phone] After 3 pm or if no answer, call [phone]

## 2016-05-07 NOTE — Telephone Encounter (Signed)
Spoke with the pt and notified of results and recs She verbalized understanding  She states she "probably" wants the pressure changed to 11 She wanted AD to know that the first half of her study was done using "nasal buds" and then the second half on FFM  She felt that she did better on the FFM  Please advise, thanks

## 2016-05-08 ENCOUNTER — Encounter: Payer: Self-pay | Admitting: Internal Medicine

## 2016-05-08 ENCOUNTER — Ambulatory Visit (INDEPENDENT_AMBULATORY_CARE_PROVIDER_SITE_OTHER): Payer: Medicare Other | Admitting: Internal Medicine

## 2016-05-08 VITALS — BP 118/62 | HR 80 | Temp 98.0°F | Resp 16 | Ht 63.25 in

## 2016-05-08 DIAGNOSIS — B370 Candidal stomatitis: Secondary | ICD-10-CM

## 2016-05-08 MED ORDER — MAGIC MOUTHWASH W/LIDOCAINE: 5.0000 mL | Freq: Three times a day (TID) | ORAL | 0 refills | Status: DC

## 2016-05-08 NOTE — Progress Notes (Signed)
   Subjective:    Patient ID: Crystal [name], female    DOB: 1950-07-08, 65 y.o.   MRN: [id number]  HPI  Patient reports to the office for evaluation of bilateral cheek and mouth sores.  She reports that she has been having some soreness in her mouth and some white streaking and some pain.  She has recently been on an antibiotic and is also taking acyclovir.  She reports that its just raw.  She reports that she can wipe it away.  She did have a cyst removed a year ago in her mouth and this was removed by Dr. Ezzard Standing.  She reports that she still can feel a lump in her mouth.  No dental pain.  She reports that she has been able to eat and drink okay.    Review of Systems  Constitutional: Positive for fatigue. Negative for chills and fever.  HENT: Positive for mouth sores. Negative for congestion, ear pain, facial swelling, postnasal drip, rhinorrhea, sinus pain, sinus pressure, sore throat, trouble swallowing and voice change.   Respiratory: Positive for cough.        Objective:   Physical Exam  Constitutional: She appears well-developed and well-nourished. No distress.  HENT:  Head: Normocephalic.  Mouth/Throat: No oropharyngeal exudate.  Minimal amounts of white exudate on cheeks and below the gum line of the teeth with  Minimal to no appreciable mucositis.  palpation does elicit mild pain per patient's report.   Eyes: Conjunctivae are normal. No scleral icterus.  Neck: Normal range of motion. Neck supple. No JVD present. No thyromegaly present.  Cardiovascular: Normal rate, regular rhythm, normal heart sounds and intact distal pulses.  Exam reveals no gallop and no friction rub.   No murmur heard. Pulmonary/Chest: Effort normal and breath sounds normal. No respiratory distress. She has no wheezes. She has no rales. She exhibits no tenderness.  Lymphadenopathy:    She has no cervical adenopathy.  Skin: She is not diaphoretic.  Nursing note and vitals reviewed.   Vitals:   05/08/16  1055  BP: 118/62  Pulse: 80  Resp: 16  Temp: 98 F (36.7 C)         Assessment & Plan:    1. Thrush -magic mouthwash TID x 1 week -increase fluid intake -not mucositis and not herpes so stop acyclovir -did recently finish abx

## 2016-05-08 NOTE — Patient Instructions (Signed)
Please start using the magic mouth wash 3 times a day.  Please swish and gargle with it for 30 seconds and spit it out.  Please do this for a week.  This will take care of the thrush.    Thrush, Adult Crystal [name], also called oral candidiasis, is a fungal infection that develops in the mouth and throat and on the tongue. It causes white patches to form on the mouth and tongue. Crystal [name] is most common in older adults, but it can occur at any age.  Many cases of thrush are mild, but this infection can also be more serious. Crystal [name] can be a recurring problem for people who have chronic illnesses or who take medicines that limit the body's ability to fight infection. Because these people have difficulty fighting infections, the fungus that causes thrush can spread throughout the body. This can cause life-threatening blood or organ infections. CAUSES  Crystal [name] is usually caused by a yeast called Candida albicans. This fungus is normally present in small amounts in the mouth and on other mucous membranes. It usually causes no harm. However, when conditions are present that allow the fungus to grow uncontrolled, it invades surrounding tissues and becomes an infection. Less often, other Candida species can also lead to thrush.  RISK FACTORS Crystal [name] is more likely to develop in the following people:  People with an impaired ability to fight infection (weakened immune system).   Older adults.   People with HIV.   People with diabetes.   People with dry mouth (xerostomia).   Pregnant women.   People with poor dental care, especially those who have false teeth.   People who use antibiotic medicines.  SIGNS AND SYMPTOMS  Crystal [name] can be a mild infection that causes no symptoms. If symptoms develop, they may include:   A burning feeling in the mouth and throat. This can occur at the start of a thrush infection.   White patches that adhere to the mouth and tongue. The tissue around the patches may be red,  raw, and painful. If rubbed (during tooth brushing, for example), the patches and the tissue of the mouth may bleed easily.   A bad taste in the mouth or difficulty tasting foods.   Cottony feeling in the mouth.   Pain during eating and swallowing. DIAGNOSIS  Your health care provider can usually diagnose thrush by looking in your mouth and asking you questions about your health.  TREATMENT  Medicines that help prevent the growth of fungi (antifungals) are the standard treatment for thrush. These medicines are either applied directly to the affected area (topical) or swallowed (oral). The treatment will depend on the severity of the condition.  Mild Thrush Mild cases of thrush may clear up with the use of an antifungal mouth rinse or lozenges. Treatment usually lasts about 14 days.  Moderate to Severe Thrush  More severe thrush infections that have spread to the esophagus are treated with an oral antifungal medicine. A topical antifungal medicine may also be used.   For some severe infections, a treatment period longer than 14 days may be needed.   Oral antifungal medicines are almost never used during pregnancy because the fetus may be harmed. However, if a pregnant woman has a rare, severe thrush infection that has spread to her blood, oral antifungal medicines may be used. In this case, the risk of harm to the mother and fetus from the severe thrush infection may be greater than the risk posed by the use of  antifungal medicines.  Persistent or Recurrent Thrush For cases of thrush that do not go away or keep coming back, treatment may involve the following:   Treatment may be needed twice as long as the symptoms last.   Treatment will include both oral and topical antifungal medicines.   People with weakened immune systems can take an antifungal medicine on a continuous basis to prevent thrush infections.  It is important to treat conditions that make you more likely to get  thrush, such as diabetes or HIV.  HOME CARE INSTRUCTIONS   Only take over-the-counter or prescription medicine as directed by your health care provider. Talk to your health care provider about an over-the-counter medicine called gentian violet, which kills bacteria and fungi.   Eat plain, unflavored yogurt as directed by your health care provider. Check the label to make sure the yogurt contains live cultures. This yogurt can help healthy bacteria grow in the mouth that can stop the growth of the fungus that causes thrush.   Try these measures to help reduce the discomfort of thrush:   Drink cold liquids such as water or iced tea.   Try flavored ice treats or frozen juices.   Eat foods that are easy to swallow, such as gelatin, ice cream, or custard.   If the patches in your mouth are painful, try drinking from a straw.   Rinse your mouth several times a day with a warm saltwater rinse. You can make the saltwater mixture with 1 tsp (6 g) of salt in 8 fl oz (0.2 L) of warm water.   If you wear dentures, remove the dentures before going to bed, brush them vigorously, and soak them in a cleaning solution as directed by your health care provider.   Women who are breastfeeding should clean their nipples with an antifungal medicine as directed by their health care provider. Dry the nipples after breastfeeding. Applying lanolin-containing body lotion may help relieve nipple soreness.  SEEK MEDICAL CARE IF:  Your symptoms are getting worse or are not improving within 7 days of starting treatment.   You have symptoms of spreading infection, such as white patches on the skin outside of the mouth.   You are nursing and you have redness, burning, or pain in the nipples that is not relieved with treatment.  MAKE SURE YOU:  Understand these instructions.  Will watch your condition.  Will get help right away if you are not doing well or get worse.   This information is not intended  to replace advice given to you by your health care provider. Make sure you discuss any questions you have with your health care provider.   Document Released: 03/11/2004 Document Revised: 07/07/2014 Document Reviewed: 01/17/2013 Elsevier Interactive Patient Education [employer]2016 [employer] Inc.

## 2016-05-08 NOTE — Telephone Encounter (Signed)
lmtcb x1 for pt. 

## 2016-05-09 NOTE — Telephone Encounter (Signed)
Pt returned call Spoke with patient and discussed AD's recommendations Pt stated that she is having some nasal congestion and thrush, so will be wearing the nasal pillows - saw ENT today and will not be able to wear FFM for about 2 weeks.  Pt would like to hold off on the [facility] order for now and will call when she is ready  Pressure change order sent Nothing further needed; will sign off

## 2016-05-12 ENCOUNTER — Other Ambulatory Visit: Payer: Self-pay | Admitting: Physician Assistant

## 2016-05-19 ENCOUNTER — Ambulatory Visit (INDEPENDENT_AMBULATORY_CARE_PROVIDER_SITE_OTHER): Payer: Medicare Other | Admitting: Physician Assistant

## 2016-05-19 VITALS — BP 128/70 | HR 83 | Temp 97.7°F | Resp 14 | Ht 64.0 in | Wt 179.0 lb

## 2016-05-19 DIAGNOSIS — Z79899 Other long term (current) drug therapy: Secondary | ICD-10-CM

## 2016-05-19 DIAGNOSIS — I10 Essential (primary) hypertension: Secondary | ICD-10-CM

## 2016-05-19 DIAGNOSIS — R5383 Other fatigue: Secondary | ICD-10-CM | POA: Diagnosis not present

## 2016-05-19 DIAGNOSIS — E785 Hyperlipidemia, unspecified: Secondary | ICD-10-CM

## 2016-05-19 DIAGNOSIS — E119 Type 2 diabetes mellitus without complications: Secondary | ICD-10-CM

## 2016-05-19 DIAGNOSIS — D649 Anemia, unspecified: Secondary | ICD-10-CM | POA: Diagnosis not present

## 2016-05-19 DIAGNOSIS — E559 Vitamin D deficiency, unspecified: Secondary | ICD-10-CM

## 2016-05-19 DIAGNOSIS — M797 Fibromyalgia: Secondary | ICD-10-CM | POA: Diagnosis not present

## 2016-05-19 LAB — HEPATIC FUNCTION PANEL
ALT: 12 U/L (ref 6–29)
AST: 17 U/L (ref 10–35)
Albumin: 4.3 g/dL (ref 3.6–5.1)
Alkaline Phosphatase: 40 U/L (ref 33–130)
Bilirubin, Direct: 0.1 mg/dL (ref <=0.2)
Indirect Bilirubin: 0.4 mg/dL (ref 0.2–1.2)
Total Bilirubin: 0.5 mg/dL (ref 0.2–1.2)
Total Protein: 6.9 g/dL (ref 6.1–8.1)

## 2016-05-19 LAB — CBC WITH DIFFERENTIAL/PLATELET
Basophils Absolute: 57 cells/uL (ref 0–200)
Basophils Relative: 1 %
Eosinophils Absolute: 228 cells/uL (ref 15–500)
Eosinophils Relative: 4 %
HCT: 41.1 % (ref 35.0–45.0)
Hemoglobin: 13.9 g/dL (ref 11.7–15.5)
Lymphocytes Relative: 23 %
Lymphs Abs: 1311 cells/uL (ref 850–3900)
MCH: 30.3 pg (ref 27.0–33.0)
MCHC: 33.8 g/dL (ref 32.0–36.0)
MCV: 89.7 fL (ref 80.0–100.0)
MPV: 8.7 fL (ref 7.5–12.5)
Monocytes Absolute: 399 cells/uL (ref 200–950)
Monocytes Relative: 7 %
Neutro Abs: 3705 cells/uL (ref 1500–7800)
Neutrophils Relative %: 65 %
Platelets: 262 10*3/uL (ref 140–400)
RBC: 4.58 MIL/uL (ref 3.80–5.10)
RDW: 14.2 % (ref 11.0–15.0)
WBC: 5.7 10*3/uL (ref 3.8–10.8)

## 2016-05-19 LAB — BASIC METABOLIC PANEL WITH GFR
BUN: 16 mg/dL (ref 7–25)
CO2: 27 mmol/L (ref 20–31)
Calcium: 9.5 mg/dL (ref 8.6–10.4)
Chloride: 102 mmol/L (ref 98–110)
Creat: 0.85 mg/dL (ref 0.50–0.99)
GFR, Est African American: 82 mL/min (ref >=60)
GFR, Est Non African American: 71 mL/min (ref >=60)
Glucose, Bld: 118 mg/dL — ABNORMAL HIGH (ref 65–99)
Potassium: 4 mmol/L (ref 3.5–5.3)
Sodium: 140 mmol/L (ref 135–146)

## 2016-05-19 LAB — TSH: TSH: 1.74 mIU/L

## 2016-05-19 LAB — IRON AND TIBC
%SAT: 25 % (ref 11–50)
Iron: 92 ug/dL (ref 45–160)
TIBC: 366 ug/dL (ref 250–450)
UIBC: 274 ug/dL (ref 125–400)

## 2016-05-19 LAB — LIPID PANEL
Cholesterol: 176 mg/dL (ref <200)
HDL: 41 mg/dL — ABNORMAL LOW (ref >50)
LDL Cholesterol: 102 mg/dL — ABNORMAL HIGH (ref <100)
Total CHOL/HDL Ratio: 4.3 Ratio (ref <5.0)
Triglycerides: 163 mg/dL — ABNORMAL HIGH (ref <150)
VLDL: 33 mg/dL — ABNORMAL HIGH (ref <30)

## 2016-05-19 LAB — FERRITIN: Ferritin: 112 ng/mL (ref 20–288)

## 2016-05-19 LAB — VITAMIN B12: Vitamin B-12: 416 pg/mL (ref 200–1100)

## 2016-05-19 LAB — MAGNESIUM: Magnesium: 2.1 mg/dL (ref 1.5–2.5)

## 2016-05-19 NOTE — Patient Instructions (Signed)
Fatigue Introduction Fatigue is feeling tired all of the time, a lack of energy, or a lack of motivation. Occasional or mild fatigue is often a normal response to activity or life in general. However, long-lasting (chronic) or extreme fatigue may indicate an underlying medical condition. Follow these instructions at home: Watch your fatigue for any changes. The following actions may help to lessen any discomfort you are feeling:  Talk to your health care provider about how much sleep you need each night. Try to get the required amount every night.  Take medicines only as directed by your health care provider.  Eat a healthy and nutritious diet. Ask your health care provider if you need help changing your diet.  Drink enough fluid to keep your urine clear or pale yellow.  Practice ways of relaxing, such as yoga, meditation, massage therapy, or acupuncture.  Exercise regularly.  Change situations that cause you stress. Try to keep your work and personal routine reasonable.  Do not abuse illegal drugs.  Limit alcohol intake to no more than 1 drink per day for nonpregnant women and 2 drinks per day for men. One drink equals 12 ounces of beer, 5 ounces of wine, or 1 ounces of hard liquor.  Take a multivitamin, if directed by your health care provider. Contact a health care provider if:  Your fatigue does not get better.  You have a fever.  You have unintentional weight loss or gain.  You have headaches.  You have difficulty:  Falling asleep.  Sleeping throughout the night.  You feel angry, guilty, anxious, or sad.  You are unable to have a bowel movement (constipation).  You skin is dry.  Your legs or another part of your body is swollen. Get help right away if:  You feel confused.  Your vision is blurry.  You feel faint or pass out.  You have a severe headache.  You have severe abdominal, pelvic, or back pain.  You have chest pain, shortness of breath, or an  irregular or fast heartbeat.  You are unable to urinate or you urinate less than normal.  You develop abnormal bleeding, such as bleeding from the rectum, vagina, nose, lungs, or nipples.  You vomit blood.  You have thoughts about harming yourself or committing suicide.  You are worried that you might harm someone else. This information is not intended to replace advice given to you by your health care provider. Make sure you discuss any questions you have with your health care provider. Document Released: 04/13/2007 Document Revised: 11/22/2015 Document Reviewed: 10/18/2013  2017 Elsevier  

## 2016-05-19 NOTE — Progress Notes (Signed)
Assessment and Plan:  Hypertension -Continue medication, monitor blood pressure at home. Continue DASH diet.  Reminder to go to the ER if any CP, SOB, nausea, dizziness, severe HA, changes vision/speech, left arm numbness and tingling and jaw pain.  Cholesterol -Continue diet and exercise. Check cholesterol.    PreDiabetes  -Continue diet and exercise. Check A1C  Vitamin D Def - check level and continue medications.   Hypothyroidism- check TSH level, continue medications the same, reminded to take on an empty stomach 30-29mns before food.   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 .   Continue diet and meds as discussed. Further disposition pending results of labs. Discussed med's effects and SE's.   Over 30 minutes of exam, counseling, chart review, and critical decision making was performed Future Appointments Date Time Provider [place] 09/08/2016 10:00 AM WUnk Pinto MD [facility] None     HPI 65y.o. female  presents for 3 month follow up on hypertension, cholesterol, diabetes and vitamin D deficiency.   Her blood pressure has been controlled at home, today her BP is BP: 128/70.  She does workout but has been very busy and has not been doing it regularly. She denies chest pain, shortness of breath,.  She has FM, has seen Dr. SVertell Limberin the past for neck/back, on cymbalta only on once a day and gabapentin.  She complains of constipation. Follows with Dr. HMaryjean Kaand will get shots in her shoulders. She is on several sedating medications and continue to have fatigue, just went up to 11 on her Bipap on Friday, following with Dr. YAnnamaria Boots has RLS controlled on meds.  She  is not on cholesterol medication, on fenofibrate and denies myalgias. Her cholesterol is not at goal. The cholesterol was:   Lab Results  Component Value Date   CHOL 171 02/15/2016   HDL 50 02/15/2016   LDLCALC 86 02/15/2016   TRIG 173 (H) 02/15/2016   CHOLHDL 3.4 02/15/2016   She has been working on diet and exercise for diabetes with CKD,and denies  paresthesia of the feet, polydipsia, polyuria and visual disturbances. Last A1C was:  Lab Results  Component Value Date   HGBA1C 6.0 (H) 02/15/2016   Lab Results  Component Value Date   GFRNONAA 65 02/15/2016    Patient is on Vitamin D supplement. Lab Results  Component Value Date   VD25OH 128 (H) 02/15/2016   She is on thyroid medication. Her medication was not changed, she is on 1 a day. Lab Results  Component Value Date   TSH 3.83 02/15/2016   BMI is Body mass index is 30.73 kg/m., she is working on diet and exercise. Wt Readings from Last 3 Encounters:  05/19/16 179 lb (81.2 kg)  05/02/16 170 lb (77.1 kg)  04/02/16 172 lb 3.2 oz (78.1 kg)    Current Medications:  Current Outpatient Prescriptions on File Prior to Visit  Medication Sig Dispense Refill  . acyclovir (ZOVIRAX) 800 MG tablet TAKE ONE TABLET BY MOUTH THREE TIMES DAILY WITH MEALS AND 2 NIGHTLY AT BEDTIME. 50 tablet PRN  . aspirin EC 81 MG tablet Take 81 mg by mouth daily.    .Marland Kitchenazelastine (ASTELIN) 0.1 % nasal  spray Place 1 spray into both nostrils 2 (two) times daily. Use in each nostril as directed 30 mL 2  . bisoprolol-hydrochlorothiazide (ZIAC) 10-6.25 MG tablet TAKE ONE TABLET BY MOUTH ONE TIME DAILY FOR BLOOD PRESSURE 90 tablet 1  . buPROPion (WELLBUTRIN XL) 300 MG 24 hr tablet TAKE 1 TABLET BY MOUTH EVERY MORNING FOR MOOD AND FOCUS ATTENTION 90 tablet 1  . ciprofloxacin (CILOXAN) 0.3 % ophthalmic solution Place 1 drop into both eyes every 4 (four) hours while awake. Administer 1 drop every 4 hours x 5 days 5 mL 0  . cyclobenzaprine (FLEXERIL) 5 MG tablet  Take 1 tablet (5 mg total) by mouth 2 (two) times daily as needed for muscle spasms. 90 tablet 1  . dexlansoprazole (DEXILANT) 60 MG capsule Take 1 capsule (60 mg total) by mouth daily. 30 capsule 0  . DULoxetine (CYMBALTA) 60 MG capsule Take 1 capsule 2 x/day for pain. 180 capsule 1  . fenofibrate micronized (LOFIBRA) 134 MG capsule TAKE 1 CAPSULE BY MOUTH DAILY BEFORE BREAKFAST 90 capsule 3  . fluticasone (FLONASE) 50 MCG/ACT nasal spray USE TWO SPRAYS IN EACH NOSTRIL DAILY AS NEEDED 16 g 99  . gabapentin (NEURONTIN) 300 MG capsule TAKE ONE CAPSULE BY MOUTH 3 TIMES A DAY 60 capsule 0  . levothyroxine (SYNTHROID, LEVOTHROID) 100 MCG tablet TAKE 1 TABLET BY MOUTH ON TUES, WED, THURS, SAT AND SUN. TAKE 1& 1/2 TABLET ON MON AND FRI 102 tablet 3  . Linaclotide (LINZESS) 290 MCG CAPS capsule Take 290 mcg by mouth daily.    [name] losartan (COZAAR) 50 MG tablet TAKE 1 TABLET (50 MG TOTAL) BY MOUTH DAILY. 90 tablet 0  . magic mouthwash w/lidocaine SOLN Take 5 mLs by mouth 3 (three) times daily. 480 mL 0  . neomycin-polymyxin-hydrocortisone (CORTISPORIN) 3.5-10000-1 otic suspension Place 4 drops into both ears 4 (four) times daily. X 7 days 10 mL 1  . oxybutynin (DITROPAN) 5 MG tablet TAKE 1 TABLET BY MOUTH 2 TIMES DAILY 180 tablet 1  . polyethylene glycol (MIRALAX / GLYCOLAX) packet Take 17 g by mouth daily as needed for mild constipation. 14 each 0  . rOPINIRole (REQUIP) 2 MG tablet TAKE 1 TABLET 4 TIMES A DAY FOR RESTLESS LEG SYNDROME 360 tablet 1  . Vitamin D, Ergocalciferol, (DRISDOL) 50000 UNITS CAPS capsule Take 1 capsule (50,000 Units total) by mouth daily. (Patient taking differently: Take 50,000 Units by mouth 2 (two) times a week. MON & FRI) 30 capsule PRN   No current facility-administered medications on file prior to visit.    Medical History:  Past Medical History:  Diagnosis Date  . Arthritis   . ASCVD (arteriosclerotic cardiovascular disease)   . Depression   . Fibromyalgia   . Gait  disorder 10/26/2012  . GERD (gastroesophageal reflux disease)   . GI bleed   . Hyperlipidemia   . Hypertension   . Hypothyroid   . Restless leg syndrome   . Rhinitis 06/25/2010  . Sleep apnea   . TIA (transient ischemic attack)    3        . Vitamin D deficiency    Allergies:  Allergies  Allergen Reactions  . Atorvastatin Other (See Comments)    Extreme pain  . Codeine Other (See Comments)    Headache  . Pravastatin Other (See Comments)    Exreme pain  . Statins Other (See Comments)    Extreme pain  . Amitriptyline Other (See Comments)    Unspecified  . Fish Oil  Nausea Only  . Linzess [Linaclotide] Other (See Comments)    Unspecified  . Nuvigil [Armodafinil] Other (See Comments)    Unspecified  . Erythromycin Other (See Comments)    Reaction unspecified  . Erythromycin Base Rash  . Flax Seed [Bio-Flax] Rash    Linseed  . Hydrocodone-Acetaminophen Rash  . Morphine Nausea And Vomiting and Rash  . Nsaids Nausea Only  . Sertraline Rash  . Sinemet [Carbidopa W-Levodopa] Rash  . Sulfamethoxazole Rash     Review of Systems:  Review of Systems  Constitutional: Positive for malaise/fatigue (just started on BiPAP 11 from 8 on Friday). Negative for chills, diaphoresis, fever and weight loss.  HENT: Negative for congestion, ear discharge, ear pain, hearing loss, nosebleeds, sore throat and tinnitus.   Respiratory: Negative for cough, hemoptysis, sputum production, shortness of breath, wheezing and stridor.   Cardiovascular: Negative.   Gastrointestinal: Positive for constipation. Negative for abdominal pain, blood in stool, diarrhea, heartburn, melena, nausea and vomiting.       Following up with Dr. Earlean Shawl today  Musculoskeletal: Positive for back pain, joint pain (shoulder), myalgias and neck pain. Negative for falls.  Skin: Negative.   Neurological: Negative for dizziness, tingling, tremors, sensory change, speech change, focal weakness, seizures, loss of consciousness,  weakness and headaches.  Psychiatric/Behavioral: Negative for depression, hallucinations, memory loss, substance abuse and suicidal ideas. The patient is not nervous/anxious and does not have insomnia.     Family history- Review and unchanged Social history- Review and unchanged Physical Exam: BP 128/70   Pulse 83   Temp 97.7 F (36.5 C) (Temporal)   Resp 14   Ht 5' 4"  (1.626 m)   Wt 179 lb (81.2 kg)   SpO2 97%   BMI 30.73 kg/m  Wt Readings from Last 3 Encounters:  05/19/16 179 lb (81.2 kg)  05/02/16 170 lb (77.1 kg)  04/02/16 172 lb 3.2 oz (78.1 kg)   General Appearance: Well nourished, in no apparent distress. Eyes: PERRLA, EOMs, conjunctiva no swelling or erythema Sinuses: No Frontal/maxillary tenderness ENT/Mouth: Ext aud canals clear, TMs without erythema, bulging. No erythema, swelling, or exudate on post pharynx.  Tonsils not swollen or erythematous. Hearing normal.  Neck: Supple, thyroid normal.  Respiratory: Respiratory effort normal, BS equal bilaterally without rales, rhonchi, wheezing or stridor.  Cardio: RRR with no MRGs. Brisk peripheral pulses without edema.  Abdomen: Soft, + BS.  Non tender, no guarding, rebound, hernias, masses. Lymphatics: Non tender without lymphadenopathy.  Musculoskeletal: Full ROM, 5/5 strength, Antalgic gait.  Skin: Warm, dry without rashes, lesions, ecchymosis.  Neuro: Cranial nerves intact. No cerebellar symptoms. 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    Vicie Mutters, PA-C 11:37 AM [facility]

## 2016-05-20 LAB — VITAMIN D 25 HYDROXY (VIT D DEFICIENCY, FRACTURES): Vit D, 25-Hydroxy: 127 ng/mL — ABNORMAL HIGH (ref 30–100)

## 2016-05-20 LAB — FOLATE RBC: RBC Folate: 419 ng/mL (ref >280)

## 2016-05-20 LAB — HEMOGLOBIN A1C
Hgb A1c MFr Bld: 5.9 % — ABNORMAL HIGH (ref <5.7)
Mean Plasma Glucose: 123 mg/dL

## 2016-05-27 ENCOUNTER — Other Ambulatory Visit: Payer: Self-pay

## 2016-05-27 MED ORDER — MAGIC MOUTHWASH W/LIDOCAINE: 5.0000 mL | Freq: Three times a day (TID) | ORAL | 0 refills | Status: DC

## 2016-05-28 ENCOUNTER — Telehealth: Payer: Self-pay | Admitting: Physician Assistant

## 2016-05-28 ENCOUNTER — Other Ambulatory Visit: Payer: Self-pay

## 2016-05-28 MED ORDER — FIRST-DUKES MOUTHWASH MT SUSP: OROMUCOSAL | 1 refills | Status: DC

## 2016-05-28 MED ORDER — MAGIC MOUTHWASH W/LIDOCAINE: 5.0000 mL | Freq: Three times a day (TID) | ORAL | 0 refills | Status: DC

## 2016-05-28 NOTE — Telephone Encounter (Signed)
Printed out magic mouth wash

## 2016-06-04 ENCOUNTER — Ambulatory Visit (INDEPENDENT_AMBULATORY_CARE_PROVIDER_SITE_OTHER): Payer: Medicare Other | Admitting: Podiatry

## 2016-06-04 ENCOUNTER — Encounter: Payer: Self-pay | Admitting: Internal Medicine

## 2016-06-04 ENCOUNTER — Encounter: Payer: Self-pay | Admitting: Podiatry

## 2016-06-04 ENCOUNTER — Ambulatory Visit (INDEPENDENT_AMBULATORY_CARE_PROVIDER_SITE_OTHER): Payer: Medicare Other

## 2016-06-04 ENCOUNTER — Ambulatory Visit (INDEPENDENT_AMBULATORY_CARE_PROVIDER_SITE_OTHER): Payer: Medicare Other | Admitting: Internal Medicine

## 2016-06-04 VITALS — BP 122/76 | HR 74 | Temp 98.0°F | Resp 16 | Ht 64.0 in | Wt 175.0 lb

## 2016-06-04 VITALS — BP 115/73 | HR 84 | Resp 16

## 2016-06-04 DIAGNOSIS — M79671 Pain in right foot: Secondary | ICD-10-CM

## 2016-06-04 DIAGNOSIS — M7750 Other enthesopathy of unspecified foot: Secondary | ICD-10-CM

## 2016-06-04 DIAGNOSIS — K1230 Oral mucositis (ulcerative), unspecified: Secondary | ICD-10-CM

## 2016-06-04 DIAGNOSIS — M779 Enthesopathy, unspecified: Secondary | ICD-10-CM

## 2016-06-04 LAB — VITAMIN B12: Vitamin B-12: 340 pg/mL (ref 200–1100)

## 2016-06-04 MED ORDER — TRIAMCINOLONE ACETONIDE 10 MG/ML IJ SUSP
10.0000 mg | Freq: Once | INTRAMUSCULAR | Status: AC
  Administered 2016-06-04: 10 mg

## 2016-06-04 MED ORDER — RANITIDINE HCL 300 MG PO TABS: 300.0000 mg | ORAL_TABLET | Freq: Every day | ORAL | 1 refills | Status: DC

## 2016-06-04 NOTE — Progress Notes (Signed)
Assessment and Plan:   1. Oral mucositis -leukoplakia vs. Possible dry mouth -cont Dukes - Ambulatory referral to ENT - Vitamin B1, whole blood - Folate RBC - Vitamin B12 - Zinc    HPI 65 y.o.female presents for 1 month follow up of soreness in her mouth.  She reports that she used the Duke's magic mouthwash.  She reports that the mouthwash did help some.  She reports that she is using the wash every two hours.  She feels like it has progressed some.  She reports that she is gagging a little bit and has been having some trouble with swallowing.  She reports that she has not had further issues.  She reports that it has been going on for over a month now and she would like to have it further investigated.  She has been swallowing okay, but sometimes she gags.  She does have some pain with sipping water.  She is eating normally.  She has seen an ENT before.  She has seen Dr. Ezzard Standing in the past.  She reports that she had him work on a cyst.    Past Medical History:  Diagnosis Date  . Arthritis   . ASCVD (arteriosclerotic cardiovascular disease)   . Depression   . Fibromyalgia   . Gait disorder 10/26/2012  . GERD (gastroesophageal reflux disease)   . GI bleed   . Hyperlipidemia   . Hypertension   . Hypothyroid   . Restless leg syndrome   . Rhinitis 06/25/2010  . Sleep apnea   . TIA (transient ischemic attack)    3        . Vitamin D deficiency      Allergies  Allergen Reactions  . Atorvastatin Other (See Comments)    Extreme pain  . Codeine Other (See Comments)    Headache  . Pravastatin Other (See Comments)    Exreme pain  . Statins Other (See Comments)    Extreme pain  . Amitriptyline Other (See Comments)    Unspecified  . Fish Oil Nausea Only  . Linzess [Linaclotide] Other (See Comments)    Unspecified  . Nuvigil [Armodafinil] Other (See Comments)    Unspecified  . Erythromycin Other (See Comments)    Reaction unspecified  . Erythromycin Base Rash  . Flax Seed  [Bio-Flax] Rash    Linseed  . Hydrocodone-Acetaminophen Rash  . Morphine Nausea And Vomiting and Rash  . Nsaids Nausea Only  . Sertraline Rash  . Sinemet [Carbidopa W-Levodopa] Rash  . Sulfamethoxazole Rash      Current Outpatient Prescriptions on File Prior to Visit  Medication Sig Dispense Refill  . acyclovir (ZOVIRAX) 800 MG tablet TAKE ONE TABLET BY MOUTH THREE TIMES DAILY WITH MEALS AND 2 NIGHTLY AT BEDTIME. 50 tablet PRN  . aspirin EC 81 MG tablet Take 81 mg by mouth daily.    [name] azelastine (ASTELIN) 0.1 % nasal spray Place 1 spray into both nostrils 2 (two) times daily. Use in each nostril as directed 30 mL 2  . bisoprolol-hydrochlorothiazide (ZIAC) 10-6.25 MG tablet TAKE ONE TABLET BY MOUTH ONE TIME DAILY FOR BLOOD PRESSURE 90 tablet 1  . buPROPion (WELLBUTRIN XL) 300 MG 24 hr tablet TAKE 1 TABLET BY MOUTH EVERY MORNING FOR MOOD AND FOCUS ATTENTION 90 tablet 1  . ciprofloxacin (CILOXAN) 0.3 % ophthalmic solution Place 1 drop into both eyes every 4 (four) hours while awake. Administer 1 drop every 4 hours x 5 days 5 mL 0  . cyclobenzaprine (FLEXERIL) 5  MG tablet Take 1 tablet (5 mg total) by mouth 2 (two) times daily as needed for muscle spasms. 90 tablet 1  . dexlansoprazole (DEXILANT) 60 MG capsule Take 1 capsule (60 mg total) by mouth daily. 30 capsule 0  . Diphenhyd-Hydrocort-Nystatin (FIRST-DUKES MOUTHWASH) SUSP 1 tsp swish and swallow q 2 hours as needed 240 mL 1  . DULoxetine (CYMBALTA) 60 MG capsule Take 1 capsule 2 x/day for pain. 180 capsule 1  . fenofibrate micronized (LOFIBRA) 134 MG capsule TAKE 1 CAPSULE BY MOUTH DAILY BEFORE BREAKFAST 90 capsule 3  . fluticasone (FLONASE) 50 MCG/ACT nasal spray USE TWO SPRAYS IN EACH NOSTRIL DAILY AS NEEDED 16 g 99  . gabapentin (NEURONTIN) 300 MG capsule TAKE ONE CAPSULE BY MOUTH 3 TIMES A DAY 60 capsule 0  . levothyroxine (SYNTHROID, LEVOTHROID) 100 MCG tablet TAKE 1 TABLET BY MOUTH ON TUES, WED, THURS, SAT AND SUN. TAKE 1& 1/2 TABLET  ON MON AND FRI 102 tablet 3  . Linaclotide (LINZESS) 290 MCG CAPS capsule Take 290 mcg by mouth daily.    [name]. losartan (COZAAR) 50 MG tablet TAKE 1 TABLET (50 MG TOTAL) BY MOUTH DAILY. 90 tablet 0  . magic mouthwash w/lidocaine SOLN Take 5 mLs by mouth 3 (three) times daily. 480 mL 0  . neomycin-polymyxin-hydrocortisone (CORTISPORIN) 3.5-10000-1 otic suspension Place 4 drops into both ears 4 (four) times daily. X 7 days 10 mL 1  . oxybutynin (DITROPAN) 5 MG tablet TAKE 1 TABLET BY MOUTH 2 TIMES DAILY 180 tablet 1  . polyethylene glycol (MIRALAX / GLYCOLAX) packet Take 17 g by mouth daily as needed for mild constipation. 14 each 0  . rOPINIRole (REQUIP) 2 MG tablet TAKE 1 TABLET 4 TIMES A DAY FOR RESTLESS LEG SYNDROME 360 tablet 1  . Vitamin D, Ergocalciferol, (DRISDOL) 50000 UNITS CAPS capsule Take 1 capsule (50,000 Units total) by mouth daily. (Patient taking differently: Take 50,000 Units by mouth 2 (two) times a week. MON & FRI) 30 capsule PRN   No current facility-administered medications on file prior to visit.     ROS: all negative except above.   Physical Exam: Filed Weights   06/04/16 1043  Weight: 175 lb (79.4 kg)   BP 122/76   Pulse 74   Temp 98 F (36.7 C) (Temporal)   Resp 16   Ht 5\' 4"  (1.626 m)   Wt 175 lb (79.4 kg)   BMI 30.04 kg/m  General Appearance: Well developed well nourished, non-toxic appearing in no apparent distress. Eyes: PERRLA, EOMs, conjunctiva w/ no swelling or erythema or discharge Sinuses: No Frontal/maxillary tenderness.  Mild ptosis of the right eye lid ENT/Mouth: Ear canals clear without swelling or erythema.  TM's normal bilaterally with no retractions, bulging, or loss of landmarks.  Mucous membranes dry.  Lacy reticular pattern to the posterior bilateral lower mucosa and also below the anterior alveolar ridge. No lesions or ulcers noted Neck: Supple, thyroid normal, no notable JVD  Respiratory: Respiratory effort normal, Clear breath sounds  anteriorly and posteriorly bilaterally without rales, rhonchi, wheezing or stridor. No retractions or accessory muscle usage. Cardio: RRR with no MRGs.   Abdomen: Soft, + BS.  Non tender, no guarding, rebound, hernias, masses.  Musculoskeletal: Full ROM, 5/5 strength, normal gait.  Skin: Warm, dry without rashes  Neuro: Awake and oriented X 3, Cranial nerves intact. Normal muscle tone, no cerebellar symptoms. Sensation intact.  Psych: normal affect, Insight and Judgment appropriate.     Terri Piedraourtney Forcucci, PA-C 11:01 AM [place]  [facility]  

## 2016-06-05 NOTE — Progress Notes (Signed)
Subjective:     Patient ID: Crystal [name], female   DOB: 1951-05-04, 65 y.o.   MRN: [id number]  HPI patient presents stating she's getting pain on top of her right foot   Review of Systems     Objective:   Physical Exam Neurovascular status intact with inflammation and what appears to be arthritis around the second metatarsophalangeal joint with thickness of the joint and reduced motion    Assessment:     Inflammatory capsulitis with possibility for arthritis    Plan:     H&P condition reviewed and I went ahead did a proximal nerve block aspirated the joint and a small amount of clear fluid and injected with a quarter cc deck some some Kenalog and advised that if this does not get better we will need to consider a lesser MPJ joint implant  X-ray report indicated that there is changes around the second MPJ with narrowing of the joint and spurring

## 2016-06-06 LAB — ZINC: Zinc: 94 ug/dL (ref 60–130)

## 2016-06-06 LAB — FOLATE RBC

## 2016-06-08 ENCOUNTER — Encounter: Payer: Self-pay | Admitting: Physician Assistant

## 2016-06-08 LAB — VITAMIN B1, WHOLE BLOOD: Vitamin B1 (Thiamine), Blood: 105 nmol/L (ref 78–185)

## 2016-06-10 ENCOUNTER — Other Ambulatory Visit: Payer: Self-pay | Admitting: *Deleted

## 2016-06-10 MED ORDER — GABAPENTIN 300 MG PO CAPS: 300.0000 mg | ORAL_CAPSULE | Freq: Three times a day (TID) | ORAL | 0 refills | Status: DC

## 2016-06-16 ENCOUNTER — Other Ambulatory Visit: Payer: Self-pay | Admitting: Internal Medicine

## 2016-06-16 DIAGNOSIS — D519 Vitamin B12 deficiency anemia, unspecified: Secondary | ICD-10-CM | POA: Insufficient documentation

## 2016-06-20 ENCOUNTER — Other Ambulatory Visit: Payer: Self-pay | Admitting: Internal Medicine

## 2016-07-03 ENCOUNTER — Ambulatory Visit: Payer: Medicare Other | Admitting: Podiatry

## 2016-07-03 ENCOUNTER — Other Ambulatory Visit: Payer: Self-pay | Admitting: Internal Medicine

## 2016-07-09 ENCOUNTER — Ambulatory Visit (INDEPENDENT_AMBULATORY_CARE_PROVIDER_SITE_OTHER): Payer: Medicare Other | Admitting: Podiatry

## 2016-07-09 ENCOUNTER — Encounter: Payer: Self-pay | Admitting: Podiatry

## 2016-07-09 DIAGNOSIS — M79671 Pain in right foot: Secondary | ICD-10-CM | POA: Diagnosis not present

## 2016-07-09 DIAGNOSIS — M779 Enthesopathy, unspecified: Secondary | ICD-10-CM | POA: Diagnosis not present

## 2016-07-09 NOTE — Progress Notes (Signed)
Subjective:     Patient ID: Crystal [name], female   DOB: 11/07/1950, 66 y.o.   MRN: [id number]  HPI patient presents stating it is still better but it's starting to hurt again and I feel like I'm walking on the bone   Review of Systems     Objective:   Physical Exam Neurovascular status intact with inflammation around the second MPJ right that's improved but it still present with indications that there is an arthritic event occurring    Assessment:     Inflammatory capsulitis with probable arthritic changes to the second MPJ    Plan:     Education rendered and at this time orthotics were scanned and I do think long-term that this is going to require possible bone surgery with joint implantation second MPJ. Patient will be seen back to recheck

## 2016-07-10 ENCOUNTER — Other Ambulatory Visit: Payer: Self-pay | Admitting: Internal Medicine

## 2016-07-21 ENCOUNTER — Other Ambulatory Visit: Payer: Self-pay | Admitting: Internal Medicine

## 2016-07-23 ENCOUNTER — Ambulatory Visit (INDEPENDENT_AMBULATORY_CARE_PROVIDER_SITE_OTHER): Payer: Medicare Other

## 2016-07-23 DIAGNOSIS — M779 Enthesopathy, unspecified: Secondary | ICD-10-CM

## 2016-07-23 NOTE — Progress Notes (Signed)
Patient presents for orthotic pick up.  Verbal and written break in and wear instructions given.  Patient will follow up in 4 weeks if symptoms worsen or fail to improve. 

## 2016-07-23 NOTE — Patient Instructions (Signed)

## 2016-07-24 ENCOUNTER — Other Ambulatory Visit: Payer: Self-pay | Admitting: Internal Medicine

## 2016-07-30 ENCOUNTER — Ambulatory Visit (INDEPENDENT_AMBULATORY_CARE_PROVIDER_SITE_OTHER): Payer: Medicare Other | Admitting: Internal Medicine

## 2016-07-30 VITALS — BP 138/78 | HR 87 | Temp 97.9°F | Wt 179.8 lb

## 2016-07-30 DIAGNOSIS — J069 Acute upper respiratory infection, unspecified: Secondary | ICD-10-CM | POA: Diagnosis not present

## 2016-07-30 MED ORDER — DEXAMETHASONE SODIUM PHOSPHATE 10 MG/ML IJ SOLN: 10.0000 mg | Freq: Once | INTRAMUSCULAR | Status: DC

## 2016-07-30 MED ORDER — AZITHROMYCIN 250 MG PO TABS: ORAL_TABLET | ORAL | 0 refills | Status: DC

## 2016-07-30 MED ORDER — NEOMYCIN-POLYMYXIN-HC 3.5-10000-1 OT SUSP: 4.0000 [drp] | Freq: Four times a day (QID) | OTIC | 1 refills | Status: DC

## 2016-07-30 MED ORDER — GABAPENTIN 300 MG PO CAPS: 300.0000 mg | ORAL_CAPSULE | Freq: Three times a day (TID) | ORAL | 0 refills | Status: DC

## 2016-07-30 MED ORDER — CYCLOBENZAPRINE HCL 10 MG PO TABS: 10.0000 mg | ORAL_TABLET | Freq: Three times a day (TID) | ORAL | 1 refills | Status: DC | PRN

## 2016-07-30 MED ORDER — FLUTICASONE PROPIONATE 50 MCG/ACT NA SUSP: NASAL | 3 refills | Status: DC

## 2016-07-30 NOTE — Progress Notes (Signed)
HPI  Patient presents to the office for evaluation of mouth soreness and right ear pain.  She reports that she still is having issues with her mouth being sore.  She reports that her ear is very full.  It has been draining clear liquid and crusting.  She reports that she was able to get some thick mucous coming out.  She reports that her hearing is okay.  She sometimes hears waves like the ocean or a high pitched noise but her clarity isn't great.  It has been going on for 1 weeks.  Patient reports wet, cough which she is bring some stuff up with in the morning.  She reports that she thinks a lot of it is nasal congestion.  They also endorse change in voice, postnasal drip, shortness of breath and nasal congestion, right ear pain, headache.  .  They have tried auralgan, and nasal sprays like astelin, flonase, and ranitidine.  They report that nothing has worked.  They admits to other sick contacts.  She reports that she is concerned that she is going to be with [facility] at the [facility] and she is afraid it give it to them.    Review of Systems  Constitutional: Positive for malaise/fatigue. Negative for chills and fever.  HENT: Positive for congestion, ear discharge, ear pain, hearing loss and sore throat.   Respiratory: Positive for cough. Negative for sputum production, shortness of breath and wheezing.   Cardiovascular: Negative for chest pain, palpitations and leg swelling.  Neurological: Positive for headaches.    PE:  Vitals:   07/30/16 1436  BP: 138/78  Pulse: 87  Temp: 97.9 F (36.6 C)   General:  Alert and non-toxic, WDWN, NAD HEENT: NCAT, PERLA, EOM normal, no occular discharge or erythema.  Nasal mucosal edema with sinus tenderness to palpation.  Oropharynx clear with minimal oropharyngeal edema and erythema.  Mucous membranes moist and pink. Neck:  Cervical adenopathy Chest:  RRR no MRGs.  Lungs clear to auscultation A&P with no wheezes rhonchi or rales.    Abdomen: +BS x 4 quadrants, soft, non-tender, no guarding, rigidity, or rebound. Skin: warm and dry no rash Neuro: A&Ox4, CN II-XII grossly intact  Assessment and Plan:   1. Acute URI -zpak -cont astelin -flonase -cont daily antihistamine -patient likely getting crusting from use of cortisporin otic recommended D/C as no evidence of otitis externa - dexamethasone (DECADRON) injection 10 mg; Inject 1 mL (10 mg total) into the muscle once.

## 2016-07-30 NOTE — Patient Instructions (Signed)
Please take flexeril 5 mg.  Please cut the 10 mg tablet in half.   Please take the zpak until it is completely gone.  Please take the flonase 2 sprays per nostril each night before bedtime.  Please use nasal saline in your nose as often as tolerated.  Please continue your ranitidine twice daily.

## 2016-08-06 ENCOUNTER — Telehealth: Payer: Self-pay | Admitting: Pulmonary Disease

## 2016-08-06 DIAGNOSIS — G4733 Obstructive sleep apnea (adult) (pediatric): Secondary | ICD-10-CM

## 2016-08-06 NOTE — Telephone Encounter (Signed)
Spoke with pt. She is needing help with her CPAP machine. Pt needs to have an order sent to [facility] for the plastic nose pieces for her mask. Order will be placed. Nothing further was needed.

## 2016-08-16 ENCOUNTER — Other Ambulatory Visit: Payer: Self-pay | Admitting: Internal Medicine

## 2016-09-01 ENCOUNTER — Ambulatory Visit (INDEPENDENT_AMBULATORY_CARE_PROVIDER_SITE_OTHER): Payer: Medicare Other | Admitting: Physician Assistant

## 2016-09-01 ENCOUNTER — Encounter: Payer: Self-pay | Admitting: Physician Assistant

## 2016-09-01 VITALS — BP 130/78 | HR 91 | Temp 97.5°F | Resp 14 | Ht 64.0 in | Wt 177.0 lb

## 2016-09-01 DIAGNOSIS — R5383 Other fatigue: Secondary | ICD-10-CM | POA: Diagnosis not present

## 2016-09-01 DIAGNOSIS — J302 Other seasonal allergic rhinitis: Secondary | ICD-10-CM

## 2016-09-01 DIAGNOSIS — E119 Type 2 diabetes mellitus without complications: Secondary | ICD-10-CM | POA: Diagnosis not present

## 2016-09-01 MED ORDER — PHENTERMINE HCL 37.5 MG PO TABS: 37.5000 mg | ORAL_TABLET | Freq: Every day | ORAL | 2 refills | Status: DC

## 2016-09-01 NOTE — Progress Notes (Signed)
Subjective:    Patient ID: Crystal [name], female    DOB: 1950/08/17, 66 y.o.   MRN: [id number]  HPI 66 y.o. WF presents with cough. She was treated with zpak, has dry cough or some sinus congestion, has had some crust right ear.   Blood pressure 130/78, pulse 91, temperature 97.5 F (36.4 C), resp. rate 14, height 5' 4"  (1.626 m), weight 177 lb (80.3 kg), SpO2 99 %.  Medications Current Outpatient Prescriptions on File Prior to Visit  Medication Sig  . acyclovir (ZOVIRAX) 800 MG tablet TAKE ONE TABLET BY MOUTH THREE TIMES DAILY WITH MEALS AND 2 NIGHTLY AT BEDTIME.  [name] aspirin EC 81 MG tablet Take 81 mg by mouth daily.  [name] azelastine (ASTELIN) 0.1 % nasal spray Place 1 spray into both nostrils 2 (two) times daily. Use in each nostril as directed  . bisoprolol-hydrochlorothiazide (ZIAC) 10-6.25 MG tablet TAKE ONE TABLET BY MOUTH ONE TIME DAILY FOR BLOOD PRESSUREFILL 07/05/2015  . buPROPion (WELLBUTRIN XL) 300 MG 24 hr tablet TAKE 1 TABLET BY MOUTH EVERY MORNING FOR MOOD AND FOCUS ATTENTION  . cyclobenzaprine (FLEXERIL) 10 MG tablet Take 1 tablet (10 mg total) by mouth 3 (three) times daily as needed for muscle spasms.  [name] dexlansoprazole (DEXILANT) 60 MG capsule Take 1 capsule (60 mg total) by mouth daily.  . DULoxetine (CYMBALTA) 60 MG capsule Take 1 capsule 2 x/day for pain.  . fenofibrate micronized (LOFIBRA) 134 MG capsule TAKE 1 CAPSULE BY MOUTH DAILY BEFORE BREAKFAST  . fluticasone (FLONASE) 50 MCG/ACT nasal spray USE TWO SPRAYS IN EACH NOSTRIL DAILY AS NEEDED  . gabapentin (NEURONTIN) 300 MG capsule Take 1 capsule (300 mg total) by mouth 3 (three) times daily.  [name] levothyroxine (SYNTHROID, LEVOTHROID) 100 MCG tablet TAKE 1 TABLET BY MOUTH ON TUES, WED, THURS, SAT AND SUN. TAKE 1& 1/2 TABLET ON MON AND FRI **12/18  . Linaclotide (LINZESS) 290 MCG CAPS capsule Take 290 mcg by mouth daily.  [name] losartan (COZAAR) 50 MG tablet TAKE 1 TABLET (50 MG TOTAL) BY MOUTH DAILY.  [name]  neomycin-polymyxin-hydrocortisone (CORTISPORIN) 3.5-10000-1 otic suspension Place 4 drops into both ears 4 (four) times daily. X 7 days  . oxybutynin (DITROPAN) 5 MG tablet TAKE 1 TABLET BY MOUTH 2 TIMES DAILY  . polyethylene glycol (MIRALAX / GLYCOLAX) packet Take 17 g by mouth daily as needed for mild constipation.  . ranitidine (ZANTAC) 300 MG tablet Take 1 tablet (300 mg total) by mouth at bedtime.  [name] rOPINIRole (REQUIP) 2 MG tablet TAKE 1 TABLET 4 TIMES A DAY FOR RESTLESS LEG SYNDROME  . Vitamin D, Ergocalciferol, (DRISDOL) 50000 units CAPS capsule TAKE ONE CAPSULE BY MOUTH ONE TIME DAILY   Current Facility-Administered Medications on File Prior to Visit  Medication  . dexamethasone (DECADRON) injection 10 mg    Problem list She has Obstructive sleep apnea; RESTLESS LEG SYNDROME; Essential hypertension; Rhinitis; GERD; Crohn's Enterocolitis / Regional enteritis; Fibromyalgia; Gait disorder; Hyperlipidemia; T2_NIDDM; Hypothyroid; Depression, controlled; Vitamin D deficiency; ASCVD; Medication management; Rotator cuff arthropathy; Morbid obesity (BMI 31.88) ; Medicare annual wellness visit, subsequent; Cervical pain; Spinal stenosis in cervical region; IBS (irritable colon syndrome); Fatigue; Sleep talking; and B12 deficiency anemia on her problem list.  Review of Systems  Constitutional: Positive for fatigue. Negative for chills and fever.  HENT: Positive for ear pain, postnasal drip, rhinorrhea and sneezing. Negative for congestion, facial swelling, mouth sores, sinus pain, sinus pressure, sore throat, trouble swallowing and voice change.   Eyes: Negative.  Respiratory: Positive for cough.   Cardiovascular: Negative.   Gastrointestinal: Negative.   Genitourinary: Negative.        Objective:   Physical Exam  Constitutional: She is oriented to person, place, and time. She appears well-developed and well-nourished. No distress.  HENT:  Head: Normocephalic and atraumatic.   Mouth/Throat: Oropharynx is clear and moist. No oropharyngeal exudate.  Eyes: Conjunctivae are normal. No scleral icterus.  Neck: Normal range of motion. Neck supple. No JVD present. No thyromegaly present.  Cardiovascular: Normal rate, regular rhythm, normal heart sounds and intact distal pulses.  Exam reveals no gallop and no friction rub.   No murmur heard. Pulmonary/Chest: Effort normal and breath sounds normal. No respiratory distress. She has no wheezes. She has no rales. She exhibits no tenderness.  Abdominal: Soft. Bowel sounds are normal. She exhibits no distension and no mass. There is no tenderness. There is no rebound and no guarding.  Musculoskeletal: Normal range of motion.  Lymphadenopathy:    She has no cervical adenopathy.  Neurological: She is alert and oriented to person, place, and time.  Skin: Skin is warm and dry. She is not diaphoretic.  Psychiatric: She has a normal mood and affect. Her behavior is normal. Judgment and thought content normal.  Nursing note and vitals reviewed.      Assessment & Plan:    T2_NIDDM Discussed general issues about diabetes pathophysiology and management., Educational material distributed., Suggested low cholesterol diet., Encouraged aerobic exercise., Discussed foot care., Reminded to get yearly retinal exam.  Fatigue, unspecified type Will try phentermine for obesity and weight loss  Morbid obesity, unspecified obesity type (HCC) Try phentermine  Chronic seasonal allergic rhinitis, unspecified trigger Get on flonase and allergy pill, no fever, chills, no signs of infection

## 2016-09-01 NOTE — Patient Instructions (Addendum)
Wellbutrin but in 1/2 x 2 weeks and then stop Can add on phentermine 37.32m start with 1/2 and then can go up to 1 tablet  Your ears and sinuses are connected by the eustachian tube. When your sinuses are inflamed, this can close off the tube and cause fluid to collect in your middle ear. This can then cause dizziness, popping, clicking, ringing, and echoing in your ears. This is often NOT an infection and does NOT require antibiotics, it is caused by inflammation so the treatments help the inflammation. This can take a long time to get better so please be patient.  Here are things you can do to help with this: - Try the Flonase or Nasonex. Remember to spray each nostril twice towards the outer part of your eye.  Do not sniff but instead pinch your nose and tilt your head back to help the medicine get into your sinuses.  The best time to do this is at bedtime.Stop if you get blurred vision or nose bleeds.  -While drinking fluids, pinch and hold nose close and swallow, to help open eustachian tubes to drain fluid behind ear drums. -Please pick one of the over the counter allergy medications below and take it once daily for allergies.  It will also help with fluid behind ear drums. Claritin or loratadine cheapest but likely the weakest  Zyrtec or certizine at night because it can make you sleepy The strongest is allegra or fexafinadine  Cheapest at [employer], [employer], [employer] -can use decongestant over the counter, please do not use if you have high blood pressure or certain heart conditions.   if worsening HA, changes vision/speech, imbalance, weakness go to the ER    Phentermine  While taking the medication we may ask that you come into the office once a month or once every 2-3 months to monitor your weight, blood pressure, and heart rate. In addition we can help answer your questions about diet, exercise, and help you every step of the way with your weight loss journey. Sometime it is helpful if you  bring in a food diary or use an app on your phone such as myfitnesspal to record your calorie intake, especially in the beginning.   You can start out on 1/3 to 1/2 a pill in the morning and if you are tolerating it well you can increase to one pill daily. I also have some patients that take 1/3 or 1/2 at lunch to help prevent night time eating.  This medication is cheapest CASH pay at [facility] 14-17 dollars and you do NOT need a membership to get meds from there.    What is this medicine? PHENTERMINE (FEN ter meen) decreases your appetite. This medicine is intended to be used in addition to a healthy reduced calorie diet and exercise. The best results are achieved this way. This medicine is only indicated for short-term use. Eventually your weight loss may level out and the medication will no longer be needed.   How should I use this medicine? Take this medicine by mouth. Follow the directions on the prescription label. The tablets should stay in the bottle until immediately before you take your dose. Take your doses at regular intervals. Do not take your medicine more often than directed.  Overdosage: If you think you have taken too much of this medicine contact a poison control center or emergency room at once. NOTE: This medicine is only for you. Do not share this  medicine with others.  What if I miss a dose? If you miss a dose, take it as soon as you can. If it is almost time for your next dose, take only that dose. Do not take double or extra doses. Do not increase or in any way change your dose without consulting your doctor.  What should I watch for while using this medicine? Notify your physician immediately if you become short of breath while doing your normal activities. Do not take this medicine within 6 hours of bedtime. It can keep you from getting to sleep. Avoid drinks that contain caffeine and try to stick to a regular bedtime every night. Do not stand  or sit up quickly, especially if you are an older patient. This reduces the risk of dizzy or fainting spells. Avoid alcoholic drinks.  What side effects may I notice from receiving this medicine? Side effects that you should report to your doctor or health care professional as soon as possible: -chest pain, palpitations -depression or severe changes in mood -increased blood pressure -irritability -nervousness or restlessness -severe dizziness -shortness of breath -problems urinating -unusual swelling of the legs -vomiting  Side effects that usually do not require medical attention (report to your doctor or health care professional if they continue or are bothersome): -blurred vision or other eye problems -changes in sexual ability or desire -constipation or diarrhea -difficulty sleeping -dry mouth or unpleasant taste -headache -nausea This list may not describe all possible side effects. Call your doctor for medical advice about side effects. You may report side effects to FDA at 1-800-FDA-1088.   Simple math prevails.    1st - exercise does not produce significant weight loss - at best one converts fat into muscle , "bulks up", loses inches, but usually stays "weight neutral"     2nd - think of your body weightas a check book: If you eat more calories than you burn up - you save money or gain weight .... Or if you spend more money than you put in the check book, ie burn up more calories than you eat, then you lose weight     3rd - if you walk or run 1 mile, you burn up 100 calories - you have to burn up 3,500 calories to lose 1 pound, ie you have to walk/run 35 miles to lose 1 measly pound. So if you want to lose 10 #, then you have to walk/run 350 miles, so.... clearly exercise is not the solution.     4. So if you consume 1,500 calories, then you have to burn up the equivalent of 15 miles to stay weight neutral - It also stands to reason that if you consume 1,500 cal/day and don't  lose weight, then you must be burning up about 1,500 cals/day to stay weight neutral.     5. If you really want to lose weight, you must cut your calorie intake 300 calories /day and at that rate you should lose about 1 # every 3 days.   6. Please purchase Dr Fara Olden Fuhrman's book(s) "The End of Dieting" & "Eat to Live" . It has some great concepts and recipes.

## 2016-09-02 ENCOUNTER — Other Ambulatory Visit: Payer: Self-pay | Admitting: *Deleted

## 2016-09-02 MED ORDER — FLUTICASONE PROPIONATE 50 MCG/ACT NA SUSP: NASAL | 3 refills | Status: DC

## 2016-09-03 ENCOUNTER — Other Ambulatory Visit: Payer: Self-pay | Admitting: *Deleted

## 2016-09-03 MED ORDER — VITAMIN D (ERGOCALCIFEROL) 1.25 MG (50000 UNIT) PO CAPS: 50000.0000 [IU] | ORAL_CAPSULE | Freq: Every day | ORAL | 1 refills | Status: DC

## 2016-09-04 ENCOUNTER — Ambulatory Visit (INDEPENDENT_AMBULATORY_CARE_PROVIDER_SITE_OTHER): Payer: Medicare Other | Admitting: Internal Medicine

## 2016-09-04 VITALS — BP 102/60 | HR 80 | Temp 97.8°F | Resp 16 | Ht 64.0 in

## 2016-09-04 DIAGNOSIS — M7121 Synovial cyst of popliteal space [Baker], right knee: Secondary | ICD-10-CM

## 2016-09-04 NOTE — Patient Instructions (Signed)
Bursitis Bursitis is inflammation and irritation of a bursa, which is one of the small, fluid-filled sacs that cushion and protect the moving parts of your body. These sacs are located between bones and muscles, muscle attachments, or skin areas next to bones. A bursa protects these structures from the wear and tear that results from frequent movement. An inflamed bursa causes pain and swelling. Fluid may build up inside the sac. Bursitis is most common near joints, especially the knees, elbows, hips, and shoulders. What are the causes? Bursitis can be caused by:  Injury from:  A direct blow, like falling on your knee or elbow.  Overuse of a joint (repetitive stress).  Infection. This can happen if bacteria gets into a bursa through a cut or scrape near a joint.  Diseases that cause joint inflammation, such as gout and rheumatoid arthritis. What increases the risk? You may be at risk for bursitis if you:  Have a job or hobby that involves a lot of repetitive stress on your joints.  Have a condition that weakens your body's defense system (immune system), such as diabetes, cancer, or HIV.  Lift and reach overhead often.  Kneel or lean on hard surfaces often.  Run or walk often. What are the signs or symptoms? The most common signs and symptoms of bursitis are:  Pain that gets worse when you move the affected body part or put weight on it.  Inflammation.  Stiffness. Other signs and symptoms may include:  Redness.  Tenderness.  Warmth.  Pain that continues after rest.  Fever and chills. This may occur in bursitis caused by infection. How is this diagnosed? Bursitis may be diagnosed by:  Medical history and physical exam.  MRI.  A procedure to drain fluid from the bursa with a needle (aspiration). The fluid may be checked for signs of infection or gout.  Blood tests to rule out other causes of inflammation. How is this treated? Bursitis can usually be treated at  home with rest, ice, compression, and elevation (RICE). For mild bursitis, RICE treatment may be all you need. Other treatments may include:  Nonsteroidal anti-inflammatory drugs (NSAIDs) to treat pain and inflammation.  Corticosteroids to fight inflammation. You may have these drugs injected into and around the area of bursitis.  Aspiration of bursitis fluid to relieve pain and improve movement.  Antibiotic medicine to treat an infected bursa.  A splint, brace, or walking aid.  Physical therapy if you continue to have pain or limited movement.  Surgery to remove a damaged or infected bursa. This may be needed if you have a very bad case of bursitis or if other treatments have not worked. Follow these instructions at home:  Take medicines only as directed by your health care provider.  If you were prescribed an antibiotic medicine, finish it all even if you start to feel better.  Rest the affected area as directed by your health care provider.  Keep the area elevated.  Avoid activities that make pain worse.  Apply ice to the injured area:  Place ice in a plastic bag.  Place a towel between your skin and the bag.  Leave the ice on for 20 minutes, 2-3 times a day.  Use splints, braces, pads, or walking aids as directed by your health care provider.  Keep all follow-up visits as directed by your health care provider. This is important. How is this prevented?  Wear knee pads if you kneel often.  Wear sturdy running or walking shoes   that fit you well.  Take regular breaks from repetitive activity.  Warm up by stretching before doing any strenuous activity.  Maintain a healthy weight or lose weight as recommended by your health care provider. Ask your health care provider if you need help.  Exercise regularly. Start any new physical activity gradually. Contact a health care provider if:  Your bursitis is not responding to treatment or home care.  You have a  fever.  You have chills. This information is not intended to replace advice given to you by your health care provider. Make sure you discuss any questions you have with your health care provider. Document Released: 06/13/2000 Document Revised: 11/22/2015 Document Reviewed: 09/05/2013 Elsevier Interactive Patient Education  2017 [employer] Inc.  

## 2016-09-04 NOTE — Progress Notes (Signed)
Assessment and Plan:   1. Synovial cyst of right popliteal space -wells score of 0 -very low suspicion of DVT -history of total knee replacement -patient cannot tolerate NSAIDs -patient cannot tolerate oral steroids -already on muscle relaxers and gabapentin and cymbalta -given total knee status recommend patient go and see ortho -can use ice or heat as desired.      HPI 67 y.o.female presents for a large lump behind the right knee.  She has noticed that it has been there for the last week.  She reports that her leg has been bothering her and has been really achy.  She reports that she has had some tingling and some numbness that she can appreciate with it.  She has not had leg swelling or redness.  She has had some tingling and numbness but no calf pain. She reports that she is sitting a little more often since she has been baby sitting.  She has not been using any ice or heat.  She just thought that it was her fibromyalgia.  She generally sees Dr. Madelon Lips.  She had her last knee put in  By somebody else.  She reports that she has not seen anybody else since the surgery.  She is comfortable to go back to him.   She is not on estrogen products, has not done long distance traveling, no recent surgery, no history of personal cancer, and no history of blood clots.  No unilateral lower leg swelling or redness.    Past Medical History:  Diagnosis Date  . Arthritis   . ASCVD (arteriosclerotic cardiovascular disease)   . Depression   . Fibromyalgia   . Gait disorder 10/26/2012  . GERD (gastroesophageal reflux disease)   . GI bleed   . Hyperlipidemia   . Hypertension   . Hypothyroid   . Restless leg syndrome   . Rhinitis 06/25/2010  . Sleep apnea   . TIA (transient ischemic attack)    3        . Vitamin D deficiency      Allergies  Allergen Reactions  . Atorvastatin Other (See Comments)    Extreme pain  . Codeine Other (See Comments)    Headache  . Pravastatin Other (See  Comments)    Exreme pain  . Statins Other (See Comments)    Extreme pain  . Amitriptyline Other (See Comments)    Unspecified  . Fish Oil Nausea Only  . Linzess [Linaclotide] Other (See Comments)    Unspecified  . Nuvigil [Armodafinil] Other (See Comments)    Unspecified  . Erythromycin Other (See Comments)    Reaction unspecified  . Erythromycin Base Rash  . Flax Seed [Bio-Flax] Rash    Linseed  . Hydrocodone-Acetaminophen Rash  . Morphine Nausea And Vomiting and Rash  . Nsaids Nausea Only  . Sertraline Rash  . Sinemet [Carbidopa W-Levodopa] Rash  . Sulfamethoxazole Rash      Current Outpatient Prescriptions on File Prior to Visit  Medication Sig Dispense Refill  . acyclovir (ZOVIRAX) 800 MG tablet TAKE ONE TABLET BY MOUTH THREE TIMES DAILY WITH MEALS AND 2 NIGHTLY AT BEDTIME. 50 tablet PRN  . aspirin EC 81 MG tablet Take 81 mg by mouth daily.    [name] azelastine (ASTELIN) 0.1 % nasal spray Place 1 spray into both nostrils 2 (two) times daily. Use in each nostril as directed 30 mL 2  . bisoprolol-hydrochlorothiazide (ZIAC) 10-6.25 MG tablet TAKE ONE TABLET BY MOUTH ONE TIME DAILY FOR BLOOD PRESSUREFILL 07/05/2015  90 tablet 1  . buPROPion (WELLBUTRIN XL) 300 MG 24 hr tablet TAKE 1 TABLET BY MOUTH EVERY MORNING FOR MOOD AND FOCUS ATTENTION 90 tablet 1  . cyclobenzaprine (FLEXERIL) 10 MG tablet Take 1 tablet (10 mg total) by mouth 3 (three) times daily as needed for muscle spasms. 270 tablet 1  . dexlansoprazole (DEXILANT) 60 MG capsule Take 1 capsule (60 mg total) by mouth daily. 30 capsule 0  . DULoxetine (CYMBALTA) 60 MG capsule Take 1 capsule 2 x/day for pain. 180 capsule 1  . fenofibrate micronized (LOFIBRA) 134 MG capsule TAKE 1 CAPSULE BY MOUTH DAILY BEFORE BREAKFAST 90 capsule 3  . fluticasone (FLONASE) 50 MCG/ACT nasal spray USE TWO SPRAYS IN EACH NOSTRIL DAILY AS NEEDED 48 g 3  . gabapentin (NEURONTIN) 300 MG capsule Take 1 capsule (300 mg total) by mouth 3 (three) times  daily. 270 capsule 0  . levothyroxine (SYNTHROID, LEVOTHROID) 100 MCG tablet TAKE 1 TABLET BY MOUTH ON TUES, WED, THURS, SAT AND SUN. TAKE 1& 1/2 TABLET ON MON AND FRI 12/18 102 tablet 3  . Linaclotide (LINZESS) 290 MCG CAPS capsule Take 290 mcg by mouth daily.    [name] losartan (COZAAR) 50 MG tablet TAKE 1 TABLET (50 MG TOTAL) BY MOUTH DAILY. 90 tablet 0  . neomycin-polymyxin-hydrocortisone (CORTISPORIN) 3.5-10000-1 otic suspension Place 4 drops into both ears 4 (four) times daily. X 7 days 10 mL 1  . oxybutynin (DITROPAN) 5 MG tablet TAKE 1 TABLET BY MOUTH 2 TIMES DAILY 180 tablet 1  . phentermine (ADIPEX-P) 37.5 MG tablet Take 1 tablet (37.5 mg total) by mouth daily before breakfast. 30 tablet 2  . polyethylene glycol (MIRALAX / GLYCOLAX) packet Take 17 g by mouth daily as needed for mild constipation. 14 each 0  . ranitidine (ZANTAC) 300 MG tablet Take 1 tablet (300 mg total) by mouth at bedtime. 60 tablet 1  . rOPINIRole (REQUIP) 2 MG tablet TAKE 1 TABLET 4 TIMES A DAY FOR RESTLESS LEG SYNDROME 360 tablet 1  . Vitamin D, Ergocalciferol, (DRISDOL) 50000 units CAPS capsule Take 1 capsule (50,000 Units total) by mouth daily. 90 capsule 1   Current Facility-Administered Medications on File Prior to Visit  Medication Dose Route Frequency Provider Last Rate Last Dose  . dexamethasone (DECADRON) injection 10 mg  10 mg Intramuscular Once [employer], PA-C        ROS: all negative except above.   Physical Exam: There were no vitals filed for this visit. BP 102/60   Pulse 80   Temp 97.8 F (36.6 C) (Temporal)   Resp 16   Ht 5\' 4"  (1.626 m)  General Appearance: Well developed well nourished, non-toxic appearing in no apparent distress. Eyes: PERRLA, EOMs, conjunctiva w/ no swelling or erythema or discharge Sinuses: No Frontal/maxillary tenderness ENT/Mouth: Ear canals clear without swelling or erythema.  TM's normal bilaterally with no retractions, bulging, or loss of landmarks.   Neck:  Supple, thyroid normal, no notable JVD  Respiratory: Respiratory effort normal, Clear breath sounds anteriorly and posteriorly bilaterally without rales, rhonchi, wheezing or stridor. No retractions or accessory muscle usage. Cardio: RRR with no MRGs.   Abdomen: Soft, + BS.  Non tender, no guarding, rebound, hernias, masses.  Musculoskeletal: Full ROM, 5/5 strength, normal gait. Bilateral knees with well healed midline surgical scars.  Mild fullness in the lateral posterior popliteal area.  Mildly tender to palpation.  No unilateral lower leg swelling or redness.  Negative holmans sign.   Skin: Warm, dry without  rashes  Neuro: Awake and oriented X 3, Cranial nerves intact. Normal muscle tone, no cerebellar symptoms. Sensation intact.  Psych: normal affect, Insight and Judgment appropriate.     Terri Piedra, PA-C 11:42 AM [facility]

## 2016-09-07 NOTE — Progress Notes (Signed)
[facility] Crystal [name], M.D.    Dyanne Carrel. Steffanie Dunn, P.A.-C      Terri Piedra, P.A.-C  [facility]                [address]                [place], [place]. [address] Telephone [phone] Telefax [phone]  Annual Screening/Preventative Visit & Comprehensive Evaluation &  Examination     This very nice 66 y.o.  [name] presents for a Screening/Preventative Visit & comprehensive evaluation and management of multiple medical co-morbidities.  Patient has been followed for HTN, Prediabetes, Hyperlipidemia, Hypothyroidism and Vitamin D Deficiency. Patient has GERD controlled with diet & meds. She also has Crohn's Dz which apparently has been quiescent.  Also patient is on BiPAP for OSA with improved sleep hygiene and less daytime sleepiness.  Patient also has a remote L Bell's Palsy w/ residue of a mild L hemi-facial paresis.      Of interest , patient has hx/o 22 surgeries. Patient has Chronic Pain Syndrome attributed to multiple neck & back surgeries and is on both Cymbalta & Gabapentin. Lat surgery was L [facility]/Dr Venetia Maxon (05/2015) for Cx Spinal Stenosis. More recently patient was evaluated for a suspected R popliteal synovial cyst and encouraged to se he orthopedist(Dr Caffery) who did her R TKR.      HTN predates since 2006. Patient's BP has been controlled at home and patient denies any cardiac symptoms as chest pain, palpitations, shortness of breath, dizziness or ankle swelling. Today's BP is at goal - 116/74.      Patient's hyperlipidemia is controlled with diet and medications. Patient denies myalgias or other medication SE's. Last lipids were near goal with sl elevated LDL 102 and elevated Trig's 163:  Lab Results  Component Value Date   CHOL 176 05/19/2016   HDL 41 (L) 05/19/2016   LDLCALC 102 (H) 05/19/2016   TRIG 163 (H) 05/19/2016   CHOLHDL 4.3 05/19/2016      Patient has Morbid Obesity (BMI 30+) and  consequent diet controlled T2_NIDDM predating since 03/2011  and patient denies reactive hypoglycemic symptoms, visual blurring, diabetic polys, or paresthesias. A1c was 6.7% in Dec 2016. Last A1c was near goal: Lab Results  Component Value Date   HGBA1C 5.9 (H) 05/19/2016      Finally, patient has history of Vitamin D Deficiency ("33" in 2008)  and last Vitamin D was sl elevated and dose was tapered.: Lab Results  Component Value Date   VD25OH 127 (H) 05/19/2016   Current Outpatient Prescriptions on File Prior to Visit  Medication Sig  . acyclovir800 MG  TAKE ONE TAB 3 x / DA WITH MEALS AND 2 AT BEDTIME.  [name] aspirin EC 81 MG  Take  daily.  . ASTELIN nasal spray 1 spray into nostrils 2 x daily  . bisoprolol-hctz 10-6.25  TAKE ONE TAB DAILY   . buPROPion-XL 300  TAKE 1 TAB EVERY MORNING   . cyclobenzaprine  10 MG Take 1 tab 3 x daily as needed   . DEXILANT 60 MG Take 1 capdaily.  . DULoxetine  60 MG Take 1 cap 2 x/day for pain.  . fenofibrate  134 MG TAKE 1 CAP  DAILY   . FLONASE nasal spray USE TWO SPRAYS IN  NOSTRIL DAILY   . gabapentin  300 MG  Take 1 cap 3 x daily.  [name] levothyroxine  100 MCG  TAKE 1  TAB x 5 da . TAKE 1.5 TAB x 2 days - MON/FRI   . Linaclotide  290 MCG  Take daily.  [name] losartan  50 MG  TAKE 1 TAB DAILY.  [name] oxybutynin  5 MG  TAKE 1 TAB 2 x DAILY  . phentermine 37.5 MG  Take 1 tab daily before breakfast.  . MIRALAX   17 g  Take  daily as needed   . ranitidine  300 MG Take 1 tab at bedtime.  [name] rOPINIRole  2 MG TAKE 1 TAB 4 TIMES A DAY FOR RLS  . Vitamin D 50,000 units  Take 1 cap daily.   Allergies  Allergen Reactions  . Atorvastatin Other (See Comments)    Extreme pain  . Codeine Other (See Comments)    Headache  . Pravastatin Other (See Comments)    Exreme pain  . Statins Other (See Comments)    Extreme pain  . Amitriptyline Other (See Comments)    Unspecified  . Fish Oil Nausea Only  . Linzess [Linaclotide] Other (See Comments)    Unspecified  . Nuvigil  [Armodafinil] Other (See Comments)    Unspecified  . Erythromycin Other (See Comments)    Reaction unspecified  . Erythromycin Base Rash  . Flax Seed [Bio-Flax] Rash    Linseed  . Hydrocodone-Acetaminophen Rash  . Morphine Nausea And Vomiting and Rash  . Nsaids Nausea Only  . Sertraline Rash  . Sinemet [Carbidopa W-Levodopa] Rash  . Sulfamethoxazole Rash   Past Medical History:  Diagnosis Date  . Arthritis   . ASCVD (arteriosclerotic cardiovascular disease)   . Depression   . Fibromyalgia   . Gait disorder 10/26/2012  . GERD (gastroesophageal reflux disease)   . GI bleed   . Hyperlipidemia   . Hypertension   . Hypothyroid   . Restless leg syndrome   . Rhinitis 06/25/2010  . Sleep apnea   . TIA (transient ischemic attack)    3        . Vitamin D deficiency    Health Maintenance  Topic Date Due  . PNA vac Low Risk Adult (2 of 2 - PPSV23) 04/16/2016  . OPHTHALMOLOGY EXAM  09/11/2016  . FOOT EXAM  11/13/2016  . HEMOGLOBIN A1C  11/16/2016  . MAMMOGRAM  11/23/2016  . COLONOSCOPY  05/16/2018  . TETANUS/TDAP  01/02/2025  . INFLUENZA VACCINE  Completed  . DEXA SCAN  Completed  . Hepatitis C Screening  Completed   Immunization History  Administered Date(s) Administered  . DT 01/03/2015  . Influenza Whole 03/30/2010  . Influenza, High Dose Seasonal PF 03/13/2015  . Influenza-Unspecified 03/31/2011, 05/14/2014, 02/27/2016  . Pneumococcal Conjugate-13 04/17/2015  . Pneumococcal-Unspecified 06/30/2005  . Td 06/30/2004  . Zoster 07/01/2007   Past Surgical History:  Procedure Laterality Date  . ABDOMINAL HYSTERECTOMY  1992  . ANTERIOR CERVICAL DECOMPRESSION/DISCECTOMY FUSION 4 LEVELS N/A 06/14/2015   Procedure: Cervical three-four Cervical four-five Cervical five- six Cervical six- seven  Anterior cervical decompression/diskectomy/fusion;  Surgeon: Maeola Harman, MD;  Location: [facility];  Service: Neurosurgery;  Laterality: N/A;  C3-4 C4-5 C5-6 C6-7 Anterior cervical  decompression/diskectomy/fusion  . APPENDECTOMY  1960  . CESAREAN SECTION  1983 & 1886  . JOINT REPLACEMENT     2   LEFT  1 RIGHT   . Knee susrgery    . ROTATOR CUFF REPAIR Left 2005   2 LEFT  1  RIGHT  . SHOULDER ADHESION RELEASE    . SPINE SURGERY    .  TOE SURGERY     LEFT       . TOE SURGERY     RIGHT  BIG TOE  . TONSILLECTOMY     T+A  . TONSILLECTOMY  1960   Family History  Problem Relation Age of Onset  . Leukemia Sister   . Lupus Sister   . Cancer Sister     uterus  . Depression Sister   . Heart disease Father   . Hypertension Father   . Hyperlipidemia Father   . Cancer Mother   . Diabetes Mother   . Osteoporosis Mother   . Leukemia Mother    Social History  Substance Use Topics  . Smoking status: Never Smoker  . Smokeless tobacco: Never Used  . Alcohol use 0.0 oz/week     Comment: rarely    ROS Constitutional: Denies fever, chills, weight loss/gain, headaches, insomnia,  night sweats, and change in appetite. Does c/o fatigue. Eyes: Denies redness, blurred vision, diplopia, discharge, itchy, watery eyes.  ENT:  Has a Left hemi Facial weakness (residuae of remote Bell's Palsy). Denies discharge, congestion, post nasal drip, epistaxis, sore throat, earache, hearing loss, dental pain, Tinnitus, Vertigo, Sinus pain, snoring.  Cardio: Denies chest pain, palpitations, irregular heartbeat, syncope, dyspnea, diaphoresis, orthopnea, PND, claudication, edema Respiratory: denies cough, dyspnea, DOE, pleurisy, hoarseness, laryngitis, wheezing.  Gastrointestinal: Denies dysphagia, heartburn, reflux, water brash, pain, cramps, nausea, vomiting, bloating, diarrhea, constipation, hematemesis, melena, hematochezia, jaundice, hemorrhoids Genitourinary: Denies dysuria, frequency, urgency, nocturia, hesitancy, discharge, hematuria, flank pain Breast: Breast lumps, nipple discharge, bleeding.  Musculoskeletal: Denies arthralgia, myalgia, stiffness, Jt. Swelling, pain, limp, and  strain/sprain. Denies falls. Skin: Denies puritis, rash, hives, warts, acne, eczema, changing in skin lesion Neuro: No weakness, tremor, incoordination, spasms, paresthesia, pain Psychiatric: Denies confusion, memory loss, sensory loss. Denies Depression. Endocrine: Denies change in weight, skin, hair change, nocturia, and paresthesia, diabetic polys, visual blurring, hyper / hypo glycemic episodes.  Heme/Lymph: No excessive bleeding, bruising, enlarged lymph nodes.  Physical Exam  BP 116/74   Pulse 80   Temp 97 F (36.1 C)   Resp 16   Ht 5' 3.5" (1.613 m)   Wt 174 lb 3.2 oz (79 kg)   BMI 30.37 kg/m   General Appearance: Well nourished and in no apparent distress. Left hemi-facial paresis.   Eyes: PERRLA, EOMs, conjunctiva no swelling or erythema, normal fundi and vessels. Widening of L>R inter palpebral fissure and flattened L nasolabial fold.  Sinuses: No frontal/maxillary tenderness ENT/Mouth: EACs patent / TMs  nl. Nares clear without erythema, swelling, mucoid exudates. Oral hygiene is good. No erythema, swelling, or exudate. Tongue normal, non-obstructing. Tonsils not swollen or erythematous. Hearing normal.  Neck: Supple, thyroid normal. No bruits, nodes or JVD. Respiratory: Respiratory effort normal.  BS equal and clear bilateral without rales, rhonci, wheezing or stridor. Cardio: Heart sounds are normal with regular rate and rhythm and no murmurs, rubs or gallops. Peripheral pulses are normal and equal bilaterally without edema. No aortic or femoral bruits. Chest: symmetric with normal excursions and percussion. Breasts: Symmetric, without lumps, nipple discharge, retractions, or fibrocystic changes.  Abdomen: Flat, soft with bowel sounds active. Nontender, no guarding, rebound, hernias, masses, or organomegaly.  Lymphatics: Non tender without lymphadenopathy.  Musculoskeletal: Full ROM all peripheral extremities, joint stability, 5/5 strength, and normal gait.  Popliteal cyst  lateral R posterior knee.  Skin: Warm and dry without rashes, lesions, cyanosis, clubbing or  ecchymosis.  Neuro: Cranial nerves intact, reflexes equal bilaterally. Normal muscle tone, no cerebellar  symptoms. Sensation to touch, vibratory and Monofilament  Intact to the topes bilatrerally.  Pysch: Alert and oriented X 3, normal affect, Insight and Judgment appropriate.   Assessment and Plan  1. Annual Preventative Screening Examination   2. Essential hypertension  - EKG 12-Lead - Urinalysis, Routine w reflex microscopic - CBC with Differential/Platelet - Magnesium - TSH  3. Mixed hyperlipidemia  - EKG 12-Lead - Hepatic function panel - Lipid panel - TSH  4. T2_NIDDM  - EKG 12-Lead - [facility] DIABETES FOOT EXAM - LOW EXTREMITY NEUR EXAM DOCUM - Hemoglobin A1c - Insulin, random  5. Vitamin D deficiency   6. Gastroesophageal reflux disease   7. Crohn's disease with complication (HCC)   8. Other chronic pain   9. Screening for ischemic heart disease  - EKG 12-Lead  10. OSA treated with BiPAP   11. RESTLESS LEG SYNDROME   12. Screening for colorectal cancer  - POC Hemoccult Bld/Stl   13. Medication management  - Urinalysis, Routine w reflex microscopic - CBC with Differential/Platelet - BASIC METABOLIC PANEL WITH GFR - Hepatic function panel - Magnesium - Lipid panel - TSH - Hemoglobin A1c - Insulin, random - VITAMIN D 25 Hydroxy        Continue prudent diet as discussed, weight control, BP monitoring, regular exercise, and medications. Discussed med's effects and SE's. Screening labs and tests as requested with regular follow-up as recommended. Over 40 minutes of exam, counseling, chart review and high complex critical decision making was performed.

## 2016-09-07 NOTE — Patient Instructions (Signed)

## 2016-09-08 ENCOUNTER — Ambulatory Visit (INDEPENDENT_AMBULATORY_CARE_PROVIDER_SITE_OTHER): Payer: Medicare Other | Admitting: Internal Medicine

## 2016-09-08 ENCOUNTER — Encounter: Payer: Self-pay | Admitting: Internal Medicine

## 2016-09-08 VITALS — BP 116/74 | HR 80 | Temp 97.0°F | Resp 16 | Ht 63.5 in | Wt 174.2 lb

## 2016-09-08 DIAGNOSIS — Z0001 Encounter for general adult medical examination with abnormal findings: Secondary | ICD-10-CM

## 2016-09-08 DIAGNOSIS — G4733 Obstructive sleep apnea (adult) (pediatric): Secondary | ICD-10-CM

## 2016-09-08 DIAGNOSIS — K219 Gastro-esophageal reflux disease without esophagitis: Secondary | ICD-10-CM

## 2016-09-08 DIAGNOSIS — E119 Type 2 diabetes mellitus without complications: Secondary | ICD-10-CM

## 2016-09-08 DIAGNOSIS — G8929 Other chronic pain: Secondary | ICD-10-CM

## 2016-09-08 DIAGNOSIS — I10 Essential (primary) hypertension: Secondary | ICD-10-CM

## 2016-09-08 DIAGNOSIS — K50919 Crohn's disease, unspecified, with unspecified complications: Secondary | ICD-10-CM

## 2016-09-08 DIAGNOSIS — Z1211 Encounter for screening for malignant neoplasm of colon: Secondary | ICD-10-CM

## 2016-09-08 DIAGNOSIS — Z1212 Encounter for screening for malignant neoplasm of rectum: Secondary | ICD-10-CM

## 2016-09-08 DIAGNOSIS — E559 Vitamin D deficiency, unspecified: Secondary | ICD-10-CM

## 2016-09-08 DIAGNOSIS — G2581 Restless legs syndrome: Secondary | ICD-10-CM

## 2016-09-08 DIAGNOSIS — Z0000 Encounter for general adult medical examination without abnormal findings: Secondary | ICD-10-CM

## 2016-09-08 DIAGNOSIS — Z79899 Other long term (current) drug therapy: Secondary | ICD-10-CM

## 2016-09-08 DIAGNOSIS — Z136 Encounter for screening for cardiovascular disorders: Secondary | ICD-10-CM

## 2016-09-08 DIAGNOSIS — E782 Mixed hyperlipidemia: Secondary | ICD-10-CM

## 2016-09-08 LAB — BASIC METABOLIC PANEL WITH GFR
BUN: 18 mg/dL (ref 7–25)
CO2: 32 mmol/L — ABNORMAL HIGH (ref 20–31)
Calcium: 9.7 mg/dL (ref 8.6–10.4)
Chloride: 100 mmol/L (ref 98–110)
Creat: 0.89 mg/dL (ref 0.50–0.99)
GFR, Est African American: 78 mL/min (ref >=60)
GFR, Est Non African American: 67 mL/min (ref >=60)
Glucose, Bld: 121 mg/dL — ABNORMAL HIGH (ref 65–99)
Potassium: 4.1 mmol/L (ref 3.5–5.3)
Sodium: 141 mmol/L (ref 135–146)

## 2016-09-08 LAB — CBC WITH DIFFERENTIAL/PLATELET
Basophils Absolute: 71 cells/uL (ref 0–200)
Basophils Relative: 1 %
Eosinophils Absolute: 213 cells/uL (ref 15–500)
Eosinophils Relative: 3 %
HCT: 44.3 % (ref 35.0–45.0)
Hemoglobin: 14.6 g/dL (ref 11.7–15.5)
Lymphocytes Relative: 22 %
Lymphs Abs: 1562 cells/uL (ref 850–3900)
MCH: 29 pg (ref 27.0–33.0)
MCHC: 33 g/dL (ref 32.0–36.0)
MCV: 87.9 fL (ref 80.0–100.0)
MPV: 9 fL (ref 7.5–12.5)
Monocytes Absolute: 639 cells/uL (ref 200–950)
Monocytes Relative: 9 %
Neutro Abs: 4615 cells/uL (ref 1500–7800)
Neutrophils Relative %: 65 %
Platelets: 300 10*3/uL (ref 140–400)
RBC: 5.04 MIL/uL (ref 3.80–5.10)
RDW: 13.3 % (ref 11.0–15.0)
WBC: 7.1 10*3/uL (ref 3.8–10.8)

## 2016-09-08 LAB — TSH: TSH: 5.45 mIU/L — ABNORMAL HIGH

## 2016-09-08 LAB — HEPATIC FUNCTION PANEL
ALT: 12 U/L (ref 6–29)
AST: 16 U/L (ref 10–35)
Albumin: 4.4 g/dL (ref 3.6–5.1)
Alkaline Phosphatase: 49 U/L (ref 33–130)
Bilirubin, Direct: 0.1 mg/dL (ref <=0.2)
Indirect Bilirubin: 0.3 mg/dL (ref 0.2–1.2)
Total Bilirubin: 0.4 mg/dL (ref 0.2–1.2)
Total Protein: 7.2 g/dL (ref 6.1–8.1)

## 2016-09-08 LAB — LIPID PANEL
Cholesterol: 181 mg/dL (ref <200)
HDL: 38 mg/dL — ABNORMAL LOW (ref >50)
LDL Cholesterol: 107 mg/dL — ABNORMAL HIGH (ref <100)
Total CHOL/HDL Ratio: 4.8 Ratio (ref <5.0)
Triglycerides: 178 mg/dL — ABNORMAL HIGH (ref <150)
VLDL: 36 mg/dL — ABNORMAL HIGH (ref <30)

## 2016-09-09 LAB — URINALYSIS, MICROSCOPIC ONLY
Bacteria, UA: NONE SEEN [HPF]
Casts: NONE SEEN [LPF]
Squamous Epithelial / LPF: NONE SEEN [HPF] (ref <=5)
Yeast: NONE SEEN [HPF]

## 2016-09-09 LAB — URINALYSIS, ROUTINE W REFLEX MICROSCOPIC
Bilirubin Urine: NEGATIVE
Glucose, UA: NEGATIVE
Hgb urine dipstick: NEGATIVE
Nitrite: NEGATIVE
Specific Gravity, Urine: 1.028 (ref 1.001–1.035)
pH: 6 (ref 5.0–8.0)

## 2016-09-09 LAB — MAGNESIUM: Magnesium: 2 mg/dL (ref 1.5–2.5)

## 2016-09-09 LAB — INSULIN, RANDOM: Insulin: 15.9 u[IU]/mL (ref 2.0–19.6)

## 2016-09-09 LAB — VITAMIN D 25 HYDROXY (VIT D DEFICIENCY, FRACTURES): Vit D, 25-Hydroxy: 86 ng/mL (ref 30–100)

## 2016-09-09 LAB — HEMOGLOBIN A1C
Hgb A1c MFr Bld: 6 % — ABNORMAL HIGH (ref <5.7)
Mean Plasma Glucose: 126 mg/dL

## 2016-09-10 ENCOUNTER — Ambulatory Visit (INDEPENDENT_AMBULATORY_CARE_PROVIDER_SITE_OTHER)
Admission: RE | Admit: 2016-09-10 | Discharge: 2016-09-10 | Disposition: A | Discharge status: Home / Self Care | Payer: Medicare Other | Source: Ambulatory Visit | Attending: Pulmonary Disease | Admitting: Pulmonary Disease

## 2016-09-10 ENCOUNTER — Other Ambulatory Visit: Payer: Self-pay | Admitting: Orthopedic Surgery

## 2016-09-10 ENCOUNTER — Encounter: Payer: Self-pay | Admitting: Pulmonary Disease

## 2016-09-10 ENCOUNTER — Ambulatory Visit (INDEPENDENT_AMBULATORY_CARE_PROVIDER_SITE_OTHER): Payer: Medicare Other | Admitting: Pulmonary Disease

## 2016-09-10 VITALS — BP 124/74 | HR 100 | Ht 63.5 in | Wt 174.0 lb

## 2016-09-10 DIAGNOSIS — G4700 Insomnia, unspecified: Secondary | ICD-10-CM

## 2016-09-10 DIAGNOSIS — J301 Allergic rhinitis due to pollen: Secondary | ICD-10-CM

## 2016-09-10 DIAGNOSIS — R059 Cough, unspecified: Secondary | ICD-10-CM

## 2016-09-10 DIAGNOSIS — R05 Cough: Secondary | ICD-10-CM

## 2016-09-10 DIAGNOSIS — G4733 Obstructive sleep apnea (adult) (pediatric): Secondary | ICD-10-CM

## 2016-09-10 DIAGNOSIS — G478 Other sleep disorders: Secondary | ICD-10-CM | POA: Diagnosis not present

## 2016-09-10 DIAGNOSIS — R5383 Other fatigue: Secondary | ICD-10-CM

## 2016-09-10 DIAGNOSIS — K219 Gastro-esophageal reflux disease without esophagitis: Secondary | ICD-10-CM | POA: Diagnosis not present

## 2016-09-10 DIAGNOSIS — M7121 Synovial cyst of popliteal space [Baker], right knee: Secondary | ICD-10-CM

## 2016-09-10 NOTE — Assessment & Plan Note (Deleted)
This is the main concern of the patient today. She states she is still tired. She has been tired the last year or so, maybe a little better since starting CPAP therapy. She does not think she is sleepy. Her ESS was 2.   I think at the end of the day, her tiredness/fatigue is multifactorial: 1. Related to severe sleep apnea which is optimally controlled with her auto CPAP.  2. Related to insufficient sleep. She usually sleeps 8-10 hours per night and she will feel better with this. Lately, she's been traveling because of family. She would've interrupted sleep and insufficient sleep. She is more tired when she is not able to sleep at least 8 hours per night. I reiterated the fact that she should try to get at least 8 hours of sleep so she will feel better. I also mentioned that, through time, our requirement for adequate sleep lessons. I don't want her to force herself sleeping more than 8 hours if 8 hours will be enough.  3. Related to medicines which are sedating. She takes: Flexeril at bedtime, Neurontin twice a day, Cymbalta in the morning. She takes Wellbutrin in the morning which theoretically is a stimulant but can have a different effect on her. Unfortunately, patient has fibromyalgia, chronic pain, depression. All these medical conditions and the medicines mentioned will make her fatigued and tired. I mentioned to her, she needs to have a good cocktail of medications. Her medicines might need to be tweaked so she does not have a lot of sedating meds in the morning. However, we need to make sure that her pain is adequately controlled. I mentioned to her she should follow-up with her regular doctor. May be a pain specialist will also help. May be alternative  therapy such as acupuncture might also help her with her medical condition. She'll touch base with her regular doctor.  4. Patient had recent blood work in August 2017. No evidence of anemia, renal failure, thyroid issues which can theoretically  make her fatigued.

## 2016-09-10 NOTE — Assessment & Plan Note (Signed)
Cont flonase + aztelin + claritin D. May need singulair

## 2016-09-10 NOTE — Patient Instructions (Signed)
  It was a pleasure taking care of you today!  Continue using your CPAP machine.   Please make sure you use your CPAP device everytime you sleep.  We will monitor the usage of your machine per your insurance requirement.  Your insurance company may take the machine from you if you are not using it regularly.   Please clean the mask, tubings, filter, water reservoir with soapy water every week.  Please use distilled water for the water reservoir.   Please call the office or your machine provider (DME company) if you are having issues with the device.   Cont flonase and claritin D. Cont heartburn meds.  Follow up with your GI MD.   Return to clinic in 6 mos  with  NP/APP

## 2016-09-10 NOTE — Assessment & Plan Note (Signed)
Recent sleep talking and acting out of dreams since starting CPAP therapy in June/2017. Likely related to REM rebound.  Not too frequent. Will observe for now. I would avoid starting her on another medicine as she is ready taking a lot of medicines. Discussed with her bedroom safety. Try to keep bedroom safe, no sharp objects, keep the door and windows locked.

## 2016-09-10 NOTE — Assessment & Plan Note (Signed)
Related to her pain acting up recently. She also has her sleep schedule all over the place. She ends up being awake at night and napping during the daytime. I told her to discuss with her primary care doctor regarding pain meds. I suggested to hold off on napping during the daytime. Disussed good sleep hygiene.

## 2016-09-10 NOTE — Assessment & Plan Note (Addendum)
Patient was dxed with OSA in 2006. Had a sleep study, unknown severity. Ended up on cpap 9 cm H2O in 2006.   Felt better using cpap. More energy. Less sleepiness. Did not tolerate mask.  Was switched to Bipap in 2012. Had a rpt study then.  She used bipap from 2012 until 2016. She stopped using it since she could not tolerate the pressure. Has issues with mask as well. Uncomfortable. Best for her was nasal mask.   Pt had a sleep study in 08/2015 where AHI was 4.1. Had sig o2 desatn.  Patient had a rpt  sleep study (home) in 11/2015 and AHI was 31.  Has RLS controlled with meds.   Patient had a in lab titration to determine whether she needed oxygen with her CPAP. Patient at a lab titration, no need for oxygen when she uses her CPAP. Optimal pressure was 11 cm water. (04/2016)  Patient feels better using CPAP. More energy. Less sleepiness. The last month: 67%, AHI 6.5. She is having some mask leak issues to last couple weeks. She had the wrong size mask. Also, her pain has been acting up recently has difficulties falling asleep related to pain. And subsequently being during the daytime.  Plan:  We extensively discussed the importance of treating OSA and the need to use PAP therapy.   Continue with cpap 11 cm water on RA. She is unhappy with [facility]. She wants to be switched to [facility]. We will put the order to switch her to [facility]. Also told her to follow-up regarding her supplies.   Patient was instructed to have mask, tubings, filter, reservoir cleaned at least once a week with soapy water.  Patient was instructed to call the office if he/she is having issues with the PAP device.    I advised patient to obtain sufficient amount of sleep --  7 to 8 hours at least in a 24 hr period.  Patient was advised to follow good sleep hygiene.  Patient was advised NOT to engage in activities requiring concentration and/or vigilance if he/she is and  sleepy.  Patient is NOT to drive  if he/she is sleepy.    [name]

## 2016-09-10 NOTE — Progress Notes (Signed)
Subjective:    Patient ID: Debroah Baller, female    DOB: 1951-06-21, 66 y.o.   MRN: [id number]  HPI  Patient was referred to the office for sleep apnea.   As you very well know, patient was dxed with OSA in 2006. Had a sleep study, unknown severity. Ended up on cpap 9 cm H2O in 2006.   Felt better using cpap. More energy. Less sleepiness. Did not tolerate mask.  Was switched to Bipap in 2012. Had a rpt study then.  She used bipap from 2012 until 2016. She stopped using it since she could not tolerate the pressure. Has issues with mask as well. Uncomfortable. Best for her was nasal mask.   Pt had a sleep study in 08/2015 where AHI was 4.1. Had sig o2 desatn.    Has RLS controlled with meds.    Non smoker. Not known to have asthma or copd.  Has sinus allergies. Stable off meds.   ROV 03/26/16 Since last seen, she had a home sleep study which showed her AHI was 30. She was started on auto CPAP 5-15 cm water. Download the last month shows 73% compliance, AHI 3.7. She recently saw Dr. Annamaria Boots for "feeling hung over when she wakes up". She felt machine's pressure was not enough so it was increased to autocpap 8-15 cm water >> tolerating it well. She feels better with cpap. Sleeps through the night.   Her main issues now is she feels tired in the morning. She is tired all day. Tiredness a little better with cpap. Has been tired x 1 yr. ESS is 2.    Pt sleep talks since starting the cpap. Occasional acting out of dreams. Not too frequent. Happened 2x since being on cpap.   ROV 09/10/16 Patient returns to the office as follow-up on her sleep apnea. Since last seen, she had a lab titration study to determine if she needed oxygen with her CPAP. That study showed she did not need oxygen with her CPAP. It also showed optimal CPAP therapy for her was 11 cm water. Currently, uses CPAP 11 cm water. That also last month: 67%, AHI 1.2. She has issues with her cpap masks (she got wrong size of mask).    Pt has been in pain x 2 yrs. Her pain is worse the last couple of weeks. She has not been sleeping well at night 2/2 pain.  She sleeps in daytime when she is not able to fall asleep at night.   Pt with cough x 1 yr. Worse the last year. Has sinus issues. Uses azteline and flonase and  claritin D.  She has Crohns and GERD. Uses PPI and zantac daily.   Not smoking. No asthma or copd.    Review of Systems  Constitutional: Negative.  Negative for fever and unexpected weight change.  HENT: Positive for congestion, ear pain and sinus pressure. Negative for dental problem, nosebleeds, postnasal drip, rhinorrhea, sneezing, sore throat and trouble swallowing.   Eyes: Positive for redness and visual disturbance. Negative for itching.  Respiratory: Positive for shortness of breath. Negative for cough, chest tightness and wheezing.   Cardiovascular: Negative.  Negative for palpitations and leg swelling.  Gastrointestinal: Negative.  Negative for nausea and vomiting.  Endocrine: Negative.   Genitourinary: Negative.  Negative for dysuria.  Musculoskeletal: Positive for arthralgias, back pain, joint swelling and neck pain.  Skin: Negative.  Negative for rash.  Allergic/Immunologic: Positive for environmental allergies.  Neurological: Positive for dizziness, light-headedness and  headaches.  Hematological: Does not bruise/bleed easily.  Psychiatric/Behavioral: Negative.  Negative for dysphoric mood. The patient is not nervous/anxious.        Objective:   Physical Exam   Vitals:  Vitals:   09/10/16 1422  BP: 124/74  Pulse: 100  SpO2: 95%  Weight: 174 lb (78.9 kg)  Height: 5' 3.5" (1.613 m)    Constitutional/General: Morbidly obese lady,  not in any distress,  Comfortably seating.  Well kempt  Body mass index is 30.34 kg/m. Wt Readings from Last 3 Encounters:  09/10/16 174 lb (78.9 kg)  09/08/16 174 lb 3.2 oz (79 kg)  09/01/16 177 lb (80.3 kg)      HEENT: Pupils equal and  reactive to light and accommodation. Anicteric sclerae. Normal nasal mucosa.   No oral  lesions,  mouth clear,  oropharynx clear, no postnasal drip. (-) Oral thrush. No dental caries.  Airway - Mallampati class III-IV  Neck: No masses. Midline trachea. No JVD, (-) LAD. (-) bruits appreciated.  Respiratory/Chest: Grossly normal chest. (-) deformity. (-) Accessory muscle use.  Symmetric expansion. (-) Tenderness on palpation.  Resonant on percussion.  Diminished BS on both lower lung zones. (-) wheezing, crackles, rhonchi (-) egophony  Cardiovascular: Regular rate and  rhythm, heart sounds normal, no murmur or gallops, no peripheral edema  Gastrointestinal:  Normal bowel sounds. Soft, non-tender. No hepatosplenomegaly.  (-) masses.   Musculoskeletal:  Normal muscle tone. Normal gait.   Extremities: Grossly normal. (-) clubbing, cyanosis.  (-) edema  Skin: (-) rash,lesions seen.   Neurological/Psychiatric : alert, oriented to time, place, person. Normal mood and affect           Assessment & Plan:  Obstructive sleep apnea Patient was dxed with OSA in 2006. Had a sleep study, unknown severity. Ended up on cpap 9 cm H2O in 2006.   Felt better using cpap. More energy. Less sleepiness. Did not tolerate mask.  Was switched to Bipap in 2012. Had a rpt study then.  She used bipap from 2012 until 2016. She stopped using it since she could not tolerate the pressure. Has issues with mask as well. Uncomfortable. Best for her was nasal mask.   Pt had a sleep study in 08/2015 where AHI was 4.1. Had sig o2 desatn.  Patient had a rpt  sleep study (home) in 11/2015 and AHI was 31.  Has RLS controlled with meds.   Patient had a in lab titration to determine whether she needed oxygen with her CPAP. Patient at a lab titration, no need for oxygen when she uses her CPAP. Optimal pressure was 11 cm water. (04/2016)  Patient feels better using CPAP. More energy. Less sleepiness. The last  month: 67%, AHI 6.5. She is having some mask leak issues to last couple weeks. She had the wrong size mask. Also, her pain has been acting up recently has difficulties falling asleep related to pain. And subsequently being during the daytime.  Plan:  We extensively discussed the importance of treating OSA and the need to use PAP therapy.   Continue with cpap 11 cm water on RA. She is unhappy with [facility]. She wants to be [facility] to [facility]. We will put the order to switch her to [facility]. Also told her to follow-up regarding her supplies.   Patient was instructed to have mask, tubings, filter, reservoir cleaned at least once a week with soapy water.  Patient was instructed to call the office if he/she is having issues with  the PAP device.    I advised patient to obtain sufficient amount of sleep --  7 to 8 hours at least in a 24 hr period.  Patient was advised to follow good sleep hygiene.  Patient was advised NOT to engage in activities requiring concentration and/or vigilance if he/she is and  sleepy.  Patient is NOT to drive if he/she is sleepy.    .  Sleep talking Recent sleep talking and acting out of dreams since starting CPAP therapy in June/2017. Likely related to REM rebound.  Not too frequent. Will observe for now. I would avoid starting her on another medicine as she is ready taking a lot of medicines. Discussed with her bedroom safety. Try to keep bedroom safe, no sharp objects, keep the door and windows locked.  GERD Cont PPI.daily. Suggest to increase zantac to BID.  Asp precaution. Diet changes.  GERD/IBD have been flared up recently. Suggested that she follows up with her gastroenterologist.  Rhinitis Cont flonase + aztelin + claritin D. May need singulair  Insomnia Related to her pain acting up recently. She also has her sleep schedule all over the place. She ends up being awake at night and napping during the daytime. I told her to discuss with  her primary care doctor regarding pain meds. I suggested to hold off on napping during the daytime. Disussed good sleep hygiene.           Patient will follow up  in 6 mos.     Monica Becton, MD 09/10/2016   5:25 PM [facility] [place] Pager: [phone] Office: [phone], Fax: [phone]                                                                                                                                                                                                                                                                                                                                     ,

## 2016-09-10 NOTE — Assessment & Plan Note (Signed)
Cont PPI.daily. Suggest to increase zantac to BID.  Asp precaution. Diet changes.  GERD/IBD have been flared up recently. Suggested that she follows up with her gastroenterologist.

## 2016-09-12 ENCOUNTER — Telehealth: Payer: Self-pay | Admitting: Pulmonary Disease

## 2016-09-12 DIAGNOSIS — J849 Interstitial pulmonary disease, unspecified: Secondary | ICD-10-CM

## 2016-09-12 NOTE — Telephone Encounter (Signed)
Notes recorded by Louann Sjogren, MD on 09/11/2016 at 2:53 PM EDT pls tell pt the CXR did not show PNA. Radiologist read it as possible "scarred lung" at the bases. I recommend a chest HRCT if Ok with pt. Dx is ILD. Thanks ------------------------------------------- Spoke with pt. She is aware of results. Order for HRCT has been placed. Nothing further was needed.

## 2016-09-16 ENCOUNTER — Telehealth: Payer: Self-pay | Admitting: *Deleted

## 2016-09-16 ENCOUNTER — Other Ambulatory Visit: Payer: Self-pay

## 2016-09-16 DIAGNOSIS — G4733 Obstructive sleep apnea (adult) (pediatric): Secondary | ICD-10-CM

## 2016-09-16 MED ORDER — MECLIZINE HCL 25 MG PO TABS: 25.0000 mg | ORAL_TABLET | Freq: Three times a day (TID) | ORAL | 0 refills | Status: DC | PRN

## 2016-09-16 NOTE — Telephone Encounter (Signed)
Patient called and states she is ou of town and is having nausea and dizziness.  Per Dr Oneta Rack send in an RX foe Meclizine. Patient is aware.

## 2016-09-25 ENCOUNTER — Other Ambulatory Visit: Payer: Self-pay | Admitting: Internal Medicine

## 2016-09-30 ENCOUNTER — Other Ambulatory Visit: Payer: Self-pay | Admitting: Orthopedic Surgery

## 2016-09-30 DIAGNOSIS — M7121 Synovial cyst of popliteal space [Baker], right knee: Secondary | ICD-10-CM

## 2016-10-02 ENCOUNTER — Other Ambulatory Visit: Payer: Self-pay | Admitting: Internal Medicine

## 2016-10-02 ENCOUNTER — Ambulatory Visit
Admission: RE | Admit: 2016-10-02 | Discharge: 2016-10-02 | Disposition: A | Discharge status: Home / Self Care | Payer: Medicare Other | Source: Ambulatory Visit | Attending: Orthopedic Surgery | Admitting: Orthopedic Surgery

## 2016-10-02 ENCOUNTER — Ambulatory Visit
Admission: RE | Admit: 2016-10-02 | Discharge: 2016-10-02 | Disposition: A | Discharge status: Home / Self Care | Payer: Medicare Other | Source: Ambulatory Visit | Attending: Internal Medicine | Admitting: Internal Medicine

## 2016-10-02 ENCOUNTER — Ambulatory Visit (INDEPENDENT_AMBULATORY_CARE_PROVIDER_SITE_OTHER)
Admission: RE | Admit: 2016-10-02 | Discharge: 2016-10-02 | Disposition: A | Discharge status: Home / Self Care | Payer: Medicare Other | Source: Ambulatory Visit | Attending: Pulmonary Disease | Admitting: Pulmonary Disease

## 2016-10-02 DIAGNOSIS — J849 Interstitial pulmonary disease, unspecified: Secondary | ICD-10-CM | POA: Diagnosis not present

## 2016-10-02 DIAGNOSIS — M7121 Synovial cyst of popliteal space [Baker], right knee: Secondary | ICD-10-CM

## 2016-10-02 DIAGNOSIS — Z1231 Encounter for screening mammogram for malignant neoplasm of breast: Secondary | ICD-10-CM

## 2016-10-03 ENCOUNTER — Other Ambulatory Visit: Payer: Self-pay

## 2016-10-03 DIAGNOSIS — R059 Cough, unspecified: Secondary | ICD-10-CM

## 2016-10-03 DIAGNOSIS — R05 Cough: Secondary | ICD-10-CM

## 2016-10-03 DIAGNOSIS — R0600 Dyspnea, unspecified: Secondary | ICD-10-CM

## 2016-10-04 ENCOUNTER — Other Ambulatory Visit: Payer: Self-pay | Admitting: Internal Medicine

## 2016-10-04 DIAGNOSIS — M797 Fibromyalgia: Secondary | ICD-10-CM

## 2016-10-05 ENCOUNTER — Other Ambulatory Visit: Payer: Self-pay | Admitting: Internal Medicine

## 2016-10-08 ENCOUNTER — Ambulatory Visit (INDEPENDENT_AMBULATORY_CARE_PROVIDER_SITE_OTHER): Payer: Medicare Other | Admitting: Physician Assistant

## 2016-10-08 ENCOUNTER — Encounter: Payer: Self-pay | Admitting: Physician Assistant

## 2016-10-08 ENCOUNTER — Other Ambulatory Visit: Payer: Self-pay | Admitting: Physician Assistant

## 2016-10-08 VITALS — BP 126/70 | HR 96 | Temp 97.8°F | Resp 16 | Ht 63.5 in | Wt 173.0 lb

## 2016-10-08 DIAGNOSIS — I10 Essential (primary) hypertension: Secondary | ICD-10-CM

## 2016-10-08 DIAGNOSIS — R05 Cough: Secondary | ICD-10-CM

## 2016-10-08 DIAGNOSIS — R110 Nausea: Secondary | ICD-10-CM | POA: Diagnosis not present

## 2016-10-08 DIAGNOSIS — K219 Gastro-esophageal reflux disease without esophagitis: Secondary | ICD-10-CM | POA: Diagnosis not present

## 2016-10-08 DIAGNOSIS — B370 Candidal stomatitis: Secondary | ICD-10-CM | POA: Diagnosis not present

## 2016-10-08 DIAGNOSIS — R059 Cough, unspecified: Secondary | ICD-10-CM

## 2016-10-08 DIAGNOSIS — E119 Type 2 diabetes mellitus without complications: Secondary | ICD-10-CM

## 2016-10-08 MED ORDER — VALSARTAN 80 MG PO TABS: ORAL_TABLET | ORAL | 11 refills | Status: DC

## 2016-10-08 MED ORDER — FIRST-DUKES MOUTHWASH MT SUSP: OROMUCOSAL | 3 refills | Status: DC

## 2016-10-08 MED ORDER — FLUCONAZOLE 100 MG PO TABS: 100.0000 mg | ORAL_TABLET | Freq: Every day | ORAL | 0 refills | Status: AC

## 2016-10-08 MED ORDER — METOCLOPRAMIDE HCL 5 MG PO TABS: 5.0000 mg | ORAL_TABLET | Freq: Three times a day (TID) | ORAL | 1 refills | Status: DC

## 2016-10-08 NOTE — Progress Notes (Signed)
66 y.o.female presents for a follow up after being on phentermine for weight loss and energy.  While on the medication they have lost 1 lbs since last visit. They deny palpitations, anxiety, trouble sleeping, elevated BP. States it feel that it is helping her especially with energy and helping daughter kelly babysit, going back up in 2 weeks however she has been sick recently. She has cough, has seen pulmonary for this, has thrush again, she is on CPAP. Had normal CT, will repeat in one year. She has cough worse in morning "feels like something hung up", right ear with fullness, decreased hearing. She is on flonase and not helping. She is on losartan. Started to have nausea intermittent worse after meals, and Sunday felt that she had thrush, has had in the past, has had some throat pain, scraped off plaques, has been using [facility] mouth wash. Last on ABX 1 month ago.  Has OV scheduled for may 19th with GI.   BMI is Body mass index is 30.16 kg/m., she is working on diet and exercise. Wt Readings from Last 3 Encounters:  10/08/16 173 lb (78.5 kg)  09/10/16 174 lb (78.9 kg)  09/08/16 174 lb 3.2 oz (79 kg)   Blood pressure 126/70, pulse 96, temperature 97.8 F (36.6 C), temperature source Temporal, resp. rate 16, height 5' 3.5" (1.613 m), weight 173 lb (78.5 kg).  Medications: Current Outpatient Prescriptions on File Prior to Visit  Medication Sig Dispense Refill  . acyclovir (ZOVIRAX) 800 MG tablet TAKE ONE TABLET BY MOUTH THREE TIMES DAILY WITH MEALS AND 2 NIGHTLY AT BEDTIME. 50 tablet PRN  . aspirin EC 81 MG tablet Take 81 mg by mouth daily.    [name] azelastine (ASTELIN) 0.1 % nasal spray Place 1 spray into both nostrils 2 (two) times daily. Use in each nostril as directed 30 mL 2  . bisoprolol-hydrochlorothiazide (ZIAC) 10-6.25 MG tablet TAKE ONE TABLET BY MOUTH ONE TIME DAILY FOR BLOOD PRESSUREFILL 07/05/2015 90 tablet 1  . buPROPion (WELLBUTRIN XL) 300 MG 24 hr tablet TAKE 1 TABLET BY MOUTH EVERY  MORNING FOR MOOD AND FOCUS ATTENTION 90 tablet 1  . cyclobenzaprine (FLEXERIL) 10 MG tablet Take 1 tablet (10 mg total) by mouth 3 (three) times daily as needed for muscle spasms. 270 tablet 1  . dexlansoprazole (DEXILANT) 60 MG capsule Take 1 capsule (60 mg total) by mouth daily. 30 capsule 0  . DULoxetine (CYMBALTA) 60 MG capsule TAKE 1 CAPSULE BY MOUTH 2 TIMES A DAY FOR PAIN. 180 capsule 1  . fenofibrate micronized (LOFIBRA) 134 MG capsule TAKE 1 CAPSULE BY MOUTH DAILY BEFORE BREAKFAST 90 capsule 3  . fluticasone (FLONASE) 50 MCG/ACT nasal spray USE TWO SPRAYS IN EACH NOSTRIL DAILY AS NEEDED 48 g 3  . gabapentin (NEURONTIN) 300 MG capsule Take 1 capsule (300 mg total) by mouth 3 (three) times daily. 270 capsule 0  . Linaclotide (LINZESS) 290 MCG CAPS capsule Take 290 mcg by mouth daily.    [name] losartan (COZAAR) 50 MG tablet TAKE 1 TABLET (50 MG TOTAL) BY MOUTH DAILY. 90 tablet 0  . losartan (COZAAR) 50 MG tablet TAKE 1 TABLET (50 MG TOTAL) BY MOUTH DAILY. 90 tablet 1  . meclizine (ANTIVERT) 25 MG tablet Take 1 tablet (25 mg total) by mouth 3 (three) times daily as needed for dizziness. 60 tablet 0  . neomycin-polymyxin-hydrocortisone (CORTISPORIN) 3.5-10000-1 otic suspension Place 4 drops into both ears 4 (four) times daily. X 7 days 10 mL 1  . oxybutynin (  DITROPAN) 5 MG tablet TAKE 1 TABLET BY MOUTH 2 TIMES DAILY 180 tablet 1  . phentermine (ADIPEX-P) 37.5 MG tablet Take 1 tablet (37.5 mg total) by mouth daily before breakfast. 30 tablet 2  . polyethylene glycol (MIRALAX / GLYCOLAX) packet Take 17 g by mouth daily as needed for mild constipation. 14 each 0  . ranitidine (ZANTAC) 300 MG tablet TAKE 1 TABLET BY MOUTH AT BEDTIME 90 tablet 3  . rOPINIRole (REQUIP) 2 MG tablet TAKE 1 TABLET 4 TIMES A DAY FOR RESTLESS LEG SYNDROME 360 tablet 1  . Vitamin D, Ergocalciferol, (DRISDOL) 50000 units CAPS capsule Take 1 capsule (50,000 Units total) by mouth daily. (Patient taking differently: Take 50,000  Units by mouth daily. Take on Mondays, Wednesday,fridays) 90 capsule 1   No current facility-administered medications on file prior to visit.     ROS: All negative except for above  Physical exam: Vitals:   10/08/16 1114  BP: 126/70  Pulse: 96  Resp: 16  Temp: 97.8 F (36.6 C)   Physical Exam  Constitutional: She appears well-developed and well-nourished. No distress.  HENT:  Head: Normocephalic.  Mouth/Throat: No oropharyngeal exudate.  Minimal amounts of white exudate on cheeks and below the gum line of the teeth with glossitis of tongue.   Eyes: Conjunctivae are normal. No scleral icterus.  Neck: Normal range of motion. Neck supple. No JVD present. No thyromegaly present.  Cardiovascular: Normal rate, regular rhythm, normal heart sounds and intact distal pulses.  Exam reveals no gallop and no friction rub.   No murmur heard. Pulmonary/Chest: Effort normal and breath sounds normal. No respiratory distress. She has no wheezes. She has no rales. She exhibits no tenderness.  Lymphadenopathy:    She has no cervical adenopathy.  Skin: She is not diaphoretic.  Nursing note and vitals reviewed.   A&P   Essential hypertension Will try to switch losartan to diovan to see if it helps cough -     valsartan (DIOVAN) 80 MG tablet; 1/2-1 tablet at for BP, stop losartan  T2_NIDDM Discussed general issues about diabetes pathophysiology and management., Educational material distributed., Suggested low cholesterol diet., Encouraged aerobic exercise., Discussed foot care., Reminded to get yearly retinal exam.  Morbid obesity (BMI 31.88)  General weight loss/lifestyle modification strategies discussed (elicit support from others; identify saboteurs; non-food rewards, etc). Continue food diary Continue restricted calorie diet Continue daily exercise as well as behavior modification such as walking further away, putting down the fork, having a plan, using stairs, etc.  Medication:  phentermine  Follow up in 2 months  Thrush -     fluconazole (DIFLUCAN) 100 MG tablet; Take 1 tablet (100 mg total) by mouth daily. -     Diphenhyd-Hydrocort-Nystatin (FIRST-DUKES MOUTHWASH) SUSP; Hydrocortisone 23m, Nystatin 3 million units, Diphenhydramine 12.566m5ml  1tsp swish and swallow every 2 hours  Cough Continue follow up GI Cough drops To more specific ARB reglan Follow up pulmonary -     valsartan (DIOVAN) 80 MG tablet; 1/2-1 tablet at for BP, stop losartan  Nausea Bland foods discussed Follow up GI -     metoCLOPramide (REGLAN) 5 MG tablet; Take 1 tablet (5 mg total) by mouth 3 (three) times daily before meals.  Gastroesophageal reflux disease, esophagitis presence not specified -     metoCLOPramide (REGLAN) 5 MG tablet; Take 1 tablet (5 mg total) by mouth 3 (three) times daily before meals. - diet discussed  Future Appointments Date Time Provider [place]/18/2018 11:00 AM AmVicie Mutters  PA-C [facility] None  03/18/2017 11:00 AM Unk Pinto, MD [facility] None  10/06/2017 10:00 AM Unk Pinto, MD [facility] None

## 2016-10-08 NOTE — Patient Instructions (Addendum)
Take reglan AS needed to help with fullness/move stomach along, this is short term medication.  Stop the losartan and do 1/2-1 of the diovan to help see if this stops the cough Start the diflucan once daily  Follow up GI  Common causes of cough OR hoarseness OR sore throat:   Allergies, Viral Infections, Acid Reflux and Bacterial Infections.  1) Allergies and viral infections cause a cough OR sore throat by post nasal drip and are often worse at night, can also have sneezing, lower grade fevers, clear/yellow mucus. This is best treated with allergy medications or nasal sprays.  Please get on allegra for 1-2 weeks The strongest is allegra or fexafinadine  Cheapest at [facility], [employer], [employer]  2) Bacterial infections are more severe than allergies or viral infections with fever, teeth pain, fatigue. This can be treated with prednisone and the same over the counter medication and after 7 days can be treated with an antibiotic.   3) Silent reflux/GERD can cause a cough OR sore throat OR hoarseness WITHOUT heart burn because the esophagus that goes to the stomach and trachea that goes to the lungs are very close and when you lay down the acid can irritate your throat and lungs. This can cause hoarseness, cough, and wheezing. Please stop any alcohol or anti-inflammatories like aleve/advil/ibuprofen and start an over the counter Prilosec or omeprazole 1-2 times daily 38mns before food for 2 weeks, then switch to over the counter zantac/ratinidine or pepcid/famotadine once at night for 2 weeks.   4) sometimes irritation causes more irritation. Try voice rest, use sugar free cough drops to prevent coughing, and try to stop clearing your throat.   If you ever have a cough that does not go away after trying these things please make a follow up visit for further evaluation or we can refer you to a specialist. Or if you ever have shortness of breath or chest pain go to the ER.     Simple math prevails.     1st - exercise does not produce significant weight loss - at best one converts fat into muscle , "bulks up", loses inches, but usually stays "weight neutral"     2nd - think of your body weightas a check book: If you eat more calories than you burn up - you save money or gain weight .... Or if you spend more money than you put in the check book, ie burn up more calories than you eat, then you lose weight     3rd - if you walk or run 1 mile, you burn up 100 calories - you have to burn up 3,500 calories to lose 1 pound, ie you have to walk/run 35 miles to lose 1 measly pound. So if you want to lose 10 #, then you have to walk/run 350 miles, so.... clearly exercise is not the solution.     4. So if you consume 1,500 calories, then you have to burn up the equivalent of 15 miles to stay weight neutral - It also stands to reason that if you consume 1,500 cal/day and don't lose weight, then you must be burning up about 1,500 cals/day to stay weight neutral.     5. If you really want to lose weight, you must cut your calorie intake 300 calories /day and at that rate you should lose about 1 # every 3 days.   6. Please purchase Dr JFara OldenFuhrman's book(s) "The End of Dieting" & "Eat to Live" . It  has some great concepts and recipes.      We want weight loss that will last so you should lose 1-2 pounds a week.  THAT IS IT! Please pick THREE things a month to change. Once it is a habit check off the item. Then pick another three items off the list to become habits.  If you are already doing a habit on the list GREAT!  Cross that item off! o Don't drink your calories. Ie, alcohol, soda, fruit juice, and sweet tea.  o Drink more water. Drink a glass when you feel hungry or before each meal.  o Eat breakfast - Complex carb and protein (likeDannon light and fit yogurt, oatmeal, fruit, eggs, [place] bacon). o Measure your cereal.  Eat no more than one cup a day. (ie [place]) o Eat an apple a day. o Add a vegetable  a day. o Try a new vegetable a month. o Use Pam! Stop using oil or butter to cook. o Don't finish your plate or use smaller plates. o Share your dessert. o Eat sugar free Jello for dessert or frozen grapes. o Don't eat 2-3 hours before bed. o Switch to whole wheat bread, pasta, and brown rice. o Make healthier choices when you eat out. No fries! o Pick baked chicken, NOT fried. o Don't forget to SLOW DOWN when you eat. It is not going anywhere.  o Take the stairs. o Park far away in the parking lot o [employer] (or weights) for 10 minutes while watching TV. o Walk at work for 10 minutes during break. o Walk outside 1 time a week with your friend, kids, dog, or significant other. o Start a walking group at [place] the mall as much as you can tolerate.  o Keep a food diary. o Weigh yourself daily. o Walk for 15 minutes 3 days per week. o Cook at home more often and eat out less.  If life happens and you go back to old habits, it is okay.  Just start over. You can do it!   If you experience chest pain, get short of breath, or tired during the exercise, please stop immediately and inform your doctor.

## 2016-10-10 ENCOUNTER — Other Ambulatory Visit: Payer: Self-pay | Admitting: Internal Medicine

## 2016-10-16 ENCOUNTER — Other Ambulatory Visit: Payer: Self-pay | Admitting: Internal Medicine

## 2016-10-16 MED ORDER — MELOXICAM 15 MG PO TABS: ORAL_TABLET | ORAL | 1 refills | Status: DC

## 2016-10-20 ENCOUNTER — Other Ambulatory Visit: Payer: Self-pay

## 2016-10-20 MED ORDER — RANITIDINE HCL 300 MG PO TABS: 300.0000 mg | ORAL_TABLET | Freq: Every day | ORAL | 3 refills | Status: DC

## 2016-10-21 ENCOUNTER — Other Ambulatory Visit: Payer: Self-pay

## 2016-10-21 MED ORDER — RANITIDINE HCL 300 MG PO TABS: 300.0000 mg | ORAL_TABLET | Freq: Every day | ORAL | 0 refills | Status: DC

## 2016-10-25 ENCOUNTER — Other Ambulatory Visit: Payer: Self-pay | Admitting: Internal Medicine

## 2016-11-12 ENCOUNTER — Other Ambulatory Visit: Payer: Self-pay | Admitting: Internal Medicine

## 2016-11-28 ENCOUNTER — Other Ambulatory Visit: Payer: Self-pay | Admitting: *Deleted

## 2016-11-28 MED ORDER — GABAPENTIN 300 MG PO CAPS: 300.0000 mg | ORAL_CAPSULE | Freq: Three times a day (TID) | ORAL | 0 refills | Status: DC

## 2016-12-01 ENCOUNTER — Other Ambulatory Visit (HOSPITAL_COMMUNITY): Payer: Self-pay | Admitting: Orthopedic Surgery

## 2016-12-01 DIAGNOSIS — T84032D Mechanical loosening of internal right knee prosthetic joint, subsequent encounter: Secondary | ICD-10-CM

## 2016-12-02 ENCOUNTER — Other Ambulatory Visit: Payer: Self-pay | Admitting: Internal Medicine

## 2016-12-02 ENCOUNTER — Ambulatory Visit (INDEPENDENT_AMBULATORY_CARE_PROVIDER_SITE_OTHER): Payer: Medicare Other | Admitting: Internal Medicine

## 2016-12-02 ENCOUNTER — Encounter: Payer: Self-pay | Admitting: Internal Medicine

## 2016-12-02 VITALS — BP 106/68 | HR 80 | Temp 97.7°F | Resp 16 | Ht 63.5 in | Wt 171.4 lb

## 2016-12-02 DIAGNOSIS — H60311 Diffuse otitis externa, right ear: Secondary | ICD-10-CM | POA: Diagnosis not present

## 2016-12-02 DIAGNOSIS — J0140 Acute pansinusitis, unspecified: Secondary | ICD-10-CM

## 2016-12-02 MED ORDER — CEPHALEXIN 500 MG PO CAPS: ORAL_CAPSULE | ORAL | 0 refills | Status: AC

## 2016-12-02 NOTE — Progress Notes (Signed)
Subjective:    Patient ID: Crystal [name], female    DOB: Oct 14, 1950, 66 y.o.   MRN: [id number]  HPI   This delightful 66 yo [name] with multiple co-morbidities presents with c/o recent treatment for a Rt ear infection w/Augmentin at an [facility] in [place]. She reports ongoing discomfort & drainage from the Rt ear and also worsening head & chest congestion/cough. Denies fevers, chills, sweats, rash or dyspnea.    Medication Sig  . acyclovir (ZOVIRAX) 800 MG tablet TAKE ONE TABLET BY MOUTH THREE TIMES DAILY WITH MEALS AND 2 NIGHTLY AT BEDTIME.  [name] aspirin EC 81 MG tablet Take 81 mg by mouth daily.  [name] azelastine (ASTELIN) 0.1 % nasal spray Place 1 spray into both nostrils 2 (two) times daily. Use in each nostril as directed  . bisoprolol-hydrochlorothiazide (ZIAC) 10-6.25 MG tablet TAKE ONE TABLET BY MOUTH ONE TIME DAILY FOR BLOOD PRESSURE   . buPROPion (WELLBUTRIN XL) 300 MG 24 hr tablet TAKE 1 TABLET BY MOUTH EVERY MORNING FOR MOOD AND FOCUS ATTENTION  . cyclobenzaprine (FLEXERIL) 5 MG tablet TAKE 1 TABLET BY MOUTH 2 TIMES DAILY AS NEEDED FOR MUSCLE SPASMS.  [name] dexlansoprazole (DEXILANT) 60 MG capsule Take 1 capsule (60 mg total) by mouth daily.  . Diphenhyd-Hydrocort-Nystatin (FIRST-DUKES MOUTHWASH) SUSP Hydrocortisone 60mg , Nystatin 3 million units, Diphenhydramine 12.5mg /66ml  1tsp swish and swallow every 2 hours  . DULoxetine (CYMBALTA) 60 MG capsule TAKE 1 CAPSULE BY MOUTH 2 TIMES A DAY FOR PAIN.  . fenofibrate micronized (LOFIBRA) 134 MG capsule TAKE 1 CAPSULE BY MOUTH DAILY BEFORE BREAKFAST  . fluticasone (FLONASE) 50 MCG/ACT nasal spray USE TWO SPRAYS IN EACH NOSTRIL DAILY AS NEEDED  . gabapentin (NEURONTIN) 300 MG capsule Take 1 capsule (300 mg total) by mouth 3 (three) times daily.  [name] levothyroxine (SYNTHROID, LEVOTHROID) 100 MCG tablet Take 100 mcg by mouth daily before breakfast.  . Linaclotide (LINZESS) 290 MCG CAPS capsule Take 290 mcg by mouth daily.  . meclizine (ANTIVERT)  25 MG tablet Take 1 tablet (25 mg total) by mouth 3 (three) times daily as needed for dizziness.  . meloxicam (MOBIC) 15 MG tablet Take 1/2 to 1 tablet daily with food forPain & Inflammation  . metoCLOPramide (REGLAN) 5 MG tablet Take 1 tablet (5 mg total) by mouth 3 (three) times daily before meals.  [name] neomycin-polymyxin-hydrocortisone (CORTISPORIN) 3.5-10000-1 otic suspension Place 4 drops into both ears 4 (four) times daily. X 7 days  . oxybutynin (DITROPAN) 5 MG tablet TAKE 1 TABLET BY MOUTH 2 TIMES DAILY  . polyethylene glycol (MIRALAX / GLYCOLAX) packet Take 17 g by mouth daily as needed for mild constipation.  . ranitidine (ZANTAC) 300 MG tablet Take 1 tablet (300 mg total) by mouth at bedtime.  [name] rOPINIRole (REQUIP) 2 MG tablet TAKE 1 TABLET 4 TIMES A DAY FOR RESTLESS LEG SYNDROME  . valsartan (DIOVAN) 80 MG tablet 1/2-1 tablet dailyt for BP, stop losartan  . Vitamin D, Ergocalciferol, (DRISDOL) 50000 units CAPS capsule Take 1 capsule (50,000 Units total) by mouth daily. (Patient taking differently: Take 50,000 Units by mouth daily. Take on Mondays, Wednesday,fridays)  . phentermine (ADIPEX-P) 37.5 MG tablet Take 1 tablet (37.5 mg total) by mouth daily before breakfast.   No facility-administered medications prior to visit.    Allergies  Allergen Reactions  . Atorvastatin Other (See Comments)    Extreme pain  . Codeine Other (See Comments)    Headache  . Pravastatin Other (See Comments)    Exreme  pain  . Statins Other (See Comments)    Extreme pain  . Amitriptyline Other (See Comments)    Unspecified  . Fish Oil Nausea Only  . Linzess [Linaclotide] Other (See Comments)    Unspecified  . Nuvigil [Armodafinil] Other (See Comments)    Unspecified  . Erythromycin Other (See Comments)    Reaction unspecified  . Erythromycin Base Rash  . Flax Seed [Bio-Flax] Rash    Linseed  . Hydrocodone-Acetaminophen Rash  . Morphine Nausea And Vomiting and Rash  . Nsaids Nausea Only  .  Sertraline Rash  . Sinemet [Carbidopa W-Levodopa] Rash  . Sulfamethoxazole Rash   Past Medical History:  Diagnosis Date  . Arthritis   . ASCVD (arteriosclerotic cardiovascular disease)   . Depression   . Fibromyalgia   . Gait disorder 10/26/2012  . GERD (gastroesophageal reflux disease)   . GI bleed   . Hyperlipidemia   . Hypertension   . Hypothyroid   . Restless leg syndrome   . Rhinitis 06/25/2010  . Sleep apnea   . TIA (transient ischemic attack)    3        . Vitamin D deficiency    Past Surgical History:  Procedure Laterality Date  . ABDOMINAL HYSTERECTOMY  1992  . ANTERIOR CERVICAL DECOMPRESSION/DISCECTOMY FUSION 4 LEVELS N/A 06/14/2015   Procedure: Cervical three-four Cervical four-five Cervical five- six Cervical six- seven  Anterior cervical decompression/diskectomy/fusion;  Surgeon: Maeola Harman, MD;  Location: [facility];  Service: Neurosurgery;  Laterality: N/A;  C3-4 C4-5 C5-6 C6-7 Anterior cervical decompression/diskectomy/fusion  . APPENDECTOMY  1960  . CESAREAN SECTION  1983 & 1886  . JOINT REPLACEMENT     2   LEFT  1 RIGHT   . Knee susrgery    . ROTATOR CUFF REPAIR Left 2005   2 LEFT  1  RIGHT  . SHOULDER ADHESION RELEASE    . SPINE SURGERY    . TOE SURGERY     LEFT       . TOE SURGERY     RIGHT  BIG TOE  . TONSILLECTOMY     T+A  . TONSILLECTOMY  1960   Review of Systems  10 point systems review negative except as above.    Objective:   Physical Exam  BP 106/68   Pulse 80   Temp 97.7 F (36.5 C)   Resp 16   Ht 5' 3.5" (1.613 m)   Wt 171 lb 6.4 oz (77.7 kg)   BMI 29.89 kg/m   Dry cough - No rash, cyanosis. No stridor.  HEENT -  Lt EAC & TM Nl.  Rt EAC inflammed with a putrid discharge or exudate. Rt Tl 1-2 (+) injected.   EOM's full. PERRLA. Bilat (+) frontal/maxillary tenderness. NasoOroPharynx clear. Neck - supple. Nl Thyroid. Carotids 2+ & No bruits, nodes, JVD Chest -  Few scattered rales & rhonchi and no wheezes. Cor - Nl HS. RRR  w/o sig MGR. PP 1(+). No edema. MS- FROM w/o deformities. Muscle power, tone and bulk Nl. Gait Nl. Neuro - No obvious Cr N abnormalities. Sensory, motor and Cerebellar functions appear Nl w/o focal abnormalities. Psyche - Mental status normal & appropriate.    Assessment & Plan:   1. Acute diffuse otitis externa of right ear  - Has Rx Cortisporin Hessie Knows Susp  that she never started and she's advised to take it   - cephALEXin (KEFLEX) 500 MG capsule; Take 1 capsule 4 x/ day with meals & bedtime  Dispense: 60 capsule; Refill: 0  2. Acute non-recurrent pansinusitis  - cephALEXin (KEFLEX) 500 MG capsule; Take 1 capsule 4 x/ day with meals & bedtime  Dispense: 60 capsule; Refill: 0  - recc ROV 2 weeks for recheck

## 2016-12-05 ENCOUNTER — Telehealth: Payer: Self-pay | Admitting: *Deleted

## 2016-12-05 NOTE — Telephone Encounter (Signed)
Patient called and reported Keflex is causing her to have nausea and dizziness.  Per Dr Oneta Rack, he wants her to take OTC Dramamine 1/2 hour prior to taking the medication and take on a full stomach.  Patient is aware.

## 2016-12-08 ENCOUNTER — Encounter (HOSPITAL_COMMUNITY): Payer: Medicare Other

## 2016-12-08 ENCOUNTER — Encounter (HOSPITAL_COMMUNITY)
Admission: RE | Admit: 2016-12-08 | Discharge: 2016-12-08 | Disposition: A | Discharge status: Home / Self Care | Payer: Medicare Other | Source: Ambulatory Visit | Attending: Orthopedic Surgery | Admitting: Orthopedic Surgery

## 2016-12-08 DIAGNOSIS — T84032D Mechanical loosening of internal right knee prosthetic joint, subsequent encounter: Secondary | ICD-10-CM | POA: Insufficient documentation

## 2016-12-08 DIAGNOSIS — X58XXXD Exposure to other specified factors, subsequent encounter: Secondary | ICD-10-CM | POA: Diagnosis not present

## 2016-12-08 MED ORDER — TECHNETIUM TC 99M MEDRONATE IV KIT
25.0000 | PACK | Freq: Once | INTRAVENOUS | Status: AC | PRN
  Administered 2016-12-08: 25 via INTRAVENOUS

## 2016-12-10 ENCOUNTER — Encounter: Payer: Self-pay | Admitting: Internal Medicine

## 2016-12-15 ENCOUNTER — Ambulatory Visit: Payer: Self-pay | Admitting: Physician Assistant

## 2016-12-17 ENCOUNTER — Ambulatory Visit: Payer: Self-pay | Admitting: Physician Assistant

## 2016-12-18 DIAGNOSIS — Z96652 Presence of left artificial knee joint: Secondary | ICD-10-CM | POA: Insufficient documentation

## 2017-01-05 NOTE — Progress Notes (Signed)
MEDICARE ANNUAL WELLNESS VISIT AND FOLLOW UP  Assessment:    Essential hypertension -well controlled currently -monitor at home -dash diet -exercise as tolerated   ASCVD (arteriosclerotic cardiovascular disease) -cont to maximize cholesterol management and BP management   Obstructive sleep apnea -cont use of cpap   Allergic rhinitis, unspecified allergic rhinitis type -cont meds  T2_NIDDM Discussed general issues about diabetes pathophysiology and management., Educational material distributed., Suggested low cholesterol diet., Encouraged aerobic exercise., Discussed foot care., Reminded to get yearly retinal exam. - Hemoglobin A1c   Hypothyroidism, unspecified hypothyroidism type Hypothyroidism-check TSH level, continue medications the same, reminded to take on an empty stomach 30-67mns before food.  - TSH   Hyperlipidemia -continue medications, check lipids, decrease fatty foods, increase activity.  - Lipid panel  Vitamin D deficiency -cont supplement  Medication management - CBC with Differential/Platelet - BASIC METABOLIC PANEL WITH GFR - Hepatic function panel  Gastroesophageal reflux disease, esophagitis presence not specified -cont meds  Crohn's disease with complication, unspecified gastrointestinal tract location ([facility] -followed by Dr. MEarlean Shawl  IBS (irritable colon syndrome) -followed by Dr. MEarlean Shawl Fibromyalgia -followed by Dr. HMaryjean Ka Rotator cuff arthropathy, unspecified laterality -followed by ortho   RESTLESS LEG SYNDROME -followed by Dr. HMaryjean Ka  Gait disorder -followed by neuro   Depression, remission partial -cont current meds   Morbid obesity, unspecified obesity type ([place] -diet and exercise as tolerated  Medicare annual wellness visit, subsequent -due next year   Cervical pain -followed by neurosurgery  Spinal stenosis in cervical region -followed by neurosurgery  Fatigue, unspecified type Multifactorial, continue to  monitor  Anemia due to vitamin B12 deficiency, unspecified B12 deficiency type Continue supplement  Insomnia, unspecified type Sleep hygiene discussed  Chronic otitis externa of right ear, unspecified type -     ciprofloxacin-dexamethasone (CIPRODEX) OTIC suspension; Place 4 drops into the right ear 2 (two) times daily. -     levofloxacin (LEVAQUIN) 500 MG tablet; Take 1 tablet (500 mg total) by mouth daily.  Need for prophylactic vaccination against Streptococcus pneumoniae (pneumococcus) -     Pneumococcal polysaccharide vaccine 23-valent greater than or equal to 2yo subcutaneous/IM   Over 30 minutes of exam, counseling, chart review, and critical decision making was performed  Plan:   During the course of the visit the patient was educated and counseled about appropriate screening and preventive services including:    Pneumococcal vaccine   Influenza vaccine  Td vaccine  Prevnar 13  Screening electrocardiogram  Screening mammography  Bone densitometry screening  Colorectal cancer screening  Diabetes screening  Glaucoma screening  Nutrition counseling   Advanced directives: given info/requested copies   Subjective:   Crystal KACZYNSKIis a 66y.o. female who presents for Medicare Annual Wellness Visit and 3 month follow up on hypertension, prediabetes, hyperlipidemia, vitamin D def.  Has been visiting her daughter, cWindle Guardis grandson, now 771 months  Her blood pressure has been controlled at home, today their BP is BP: 118/68 She does workout, she has been having bilateral knee pain, seeing specialist.  . She denies chest pain, shortness of breath, dizziness.  She continue to have right ear infection.  She is not on cholesterol medication and denies myalgias. Her cholesterol is at goal. The cholesterol last visit was:   Lab Results  Component Value Date   CHOL 155 01/06/2017   HDL 36 (L) 01/06/2017   LDLCALC 84 01/06/2017   TRIG 177 (H) 01/06/2017    CHOLHDL 4.3 01/06/2017   She  has been working on diet and exercise for prediabetes, and denies foot ulcerations, hyperglycemia, hypoglycemia , increased appetite, nausea, paresthesia of the feet, polydipsia, polyuria, visual disturbances, vomiting and weight loss. Last A1C in the office was:  Lab Results  Component Value Date   HGBA1C 5.9 (H) 01/06/2017   Last GFR Lab Results  Component Value Date   GFRNONAA 57 (L) 01/06/2017   Patient is on Vitamin D supplement. Lab Results  Component Value Date   VD25OH 86 09/08/2016     BMI is Body mass index is 29.5 kg/m., she is working on diet and exercise. She is on phentermine and doing well with weight loss.  Wt Readings from Last 3 Encounters:  01/06/17 169 lb 3.2 oz (76.7 kg)  12/02/16 171 lb 6.4 oz (77.7 kg)  10/08/16 173 lb (78.5 kg)     Medication Review Current Outpatient Prescriptions on File Prior to Visit  Medication Sig  . acyclovir (ZOVIRAX) 800 MG tablet TAKE ONE TABLET BY MOUTH THREE TIMES DAILY WITH MEALS AND 2 NIGHTLY AT BEDTIME.  [name] aspirin EC 81 MG tablet Take 81 mg by mouth daily.  [name] azelastine (ASTELIN) 0.1 % nasal spray Place 1 spray into both nostrils 2 (two) times daily. Use in each nostril as directed  . bisoprolol-hydrochlorothiazide (ZIAC) 10-6.25 MG tablet TAKE ONE TABLET BY MOUTH ONE TIME DAILY FOR BLOOD PRESSUREFILL 07/05/2015  . buPROPion (WELLBUTRIN XL) 300 MG 24 hr tablet TAKE 1 TABLET BY MOUTH EVERY MORNING FOR MOOD AND FOCUS ATTENTION  . cyclobenzaprine (FLEXERIL) 5 MG tablet TAKE 1 TABLET BY MOUTH 2 TIMES DAILY AS NEEDED FOR MUSCLE SPASMS.  [name] dexlansoprazole (DEXILANT) 60 MG capsule Take 1 capsule (60 mg total) by mouth daily.  . DULoxetine (CYMBALTA) 60 MG capsule TAKE 1 CAPSULE BY MOUTH 2 TIMES A DAY FOR PAIN.  . fenofibrate micronized (LOFIBRA) 134 MG capsule TAKE 1 CAPSULE BY MOUTH DAILY BEFORE BREAKFAST  . fluticasone (FLONASE) 50 MCG/ACT nasal spray USE TWO SPRAYS IN EACH NOSTRIL DAILY AS NEEDED   . gabapentin (NEURONTIN) 300 MG capsule Take 1 capsule (300 mg total) by mouth 3 (three) times daily.  [name] levothyroxine (SYNTHROID, LEVOTHROID) 100 MCG tablet Take 100 mcg by mouth daily before breakfast.  . Linaclotide (LINZESS) 290 MCG CAPS capsule Take 290 mcg by mouth daily.  . meclizine (ANTIVERT) 25 MG tablet Take 1 tablet (25 mg total) by mouth 3 (three) times daily as needed for dizziness.  . metoCLOPramide (REGLAN) 5 MG tablet Take 1 tablet (5 mg total) by mouth 3 (three) times daily before meals.  [name] neomycin-polymyxin-hydrocortisone (CORTISPORIN) 3.5-10000-1 otic suspension Place 4 drops into both ears 4 (four) times daily. X 7 days  . oxybutynin (DITROPAN) 5 MG tablet TAKE 1 TABLET BY MOUTH 2 TIMES DAILY  . polyethylene glycol (MIRALAX / GLYCOLAX) packet Take 17 g by mouth daily as needed for mild constipation.  . ranitidine (ZANTAC) 300 MG tablet Take 1 tablet (300 mg total) by mouth at bedtime.  [name] rOPINIRole (REQUIP) 2 MG tablet TAKE 1 TABLET 4 TIMES A DAY FOR RESTLESS LEG SYNDROME  . valsartan (DIOVAN) 80 MG tablet 1/2-1 tablet dailyt for BP, stop losartan  . Vitamin D, Ergocalciferol, (DRISDOL) 50000 units CAPS capsule Take 1 capsule (50,000 Units total) by mouth daily. (Patient taking differently: Take 50,000 Units by mouth daily. Take on Mondays, Wednesday,fridays)   No current facility-administered medications on file prior to visit.     Current Problems (verified) Patient Active Problem List  Diagnosis Date Noted  . Insomnia 09/10/2016  . B12 deficiency anemia 06/16/2016  . Fatigue 03/26/2016  . Sleep talking 03/26/2016  . IBS (irritable colon syndrome) 08/09/2015  . Spinal stenosis in cervical region 06/14/2015  . Cervical pain 03/06/2015  . Medicare annual wellness visit, subsequent 01/31/2015  . Morbid obesity (BMI 31.88)  10/31/2014  . Medication management 10/25/2013  . Rotator cuff arthropathy 09/15/2013  . Hyperlipidemia   . T2_NIDDM   . Hypothyroid   .  Depression, major, recurrent, in partial remission ([place])   . Vitamin D deficiency   . ASCVD   . Gait disorder 10/26/2012  . Rhinitis 06/25/2010  . Obstructive sleep apnea 06/21/2010  . RESTLESS LEG SYNDROME 06/21/2010  . Essential hypertension 06/21/2010  . GERD 06/21/2010  . Crohn's Enterocolitis / Regional enteritis 06/21/2010  . Fibromyalgia 06/21/2010    Screening Tests Immunization History  Administered Date(s) Administered  . DT 01/03/2015  . Influenza Whole 03/30/2010  . Influenza, High Dose Seasonal PF 03/13/2015  . Influenza-Unspecified 03/31/2011, 05/14/2014, 02/27/2016  . Pneumococcal Conjugate-13 04/17/2015  . Pneumococcal Polysaccharide-23 01/06/2017  . Pneumococcal-Unspecified 06/30/2005  . Td 06/30/2004  . Zoster 07/01/2007    Preventative care: Last colonoscopy: Dr. Earlean Shawl, due 2019 Last mammogram: 2016 DEXA: 2014  Prior vaccinations: TD or Tdap: 2016  Influenza: 2017  Pneumococcal: 2007 DUE Prevnar13: 2016 Shingles/Zostavax: 2009  Names of Other Physician/Practitioners you currently use: 1. [facility]- here for primary care 2. Dr. Nori Riis, eye doctor, last visit 2017 3. Tina Griffiths, dentist, last visit 2018 q 6 months Patient Care Team: Unk Pinto, MD as PCP - General (Internal Medicine) Star Age, MD as Attending Physician (Neurology) Richmond Campbell, MD as Consulting Physician (Gastroenterology) Erline Levine, MD as Consulting Physician (Neurosurgery) Katy Apo, MD as Consulting Physician (Ophthalmology) Crista Luria, MD as Consulting Physician (Dermatology) Carney Bern, MD as Referring Physician (Orthopedic Surgery)  Allergies Allergies  Allergen Reactions  . Atorvastatin Other (See Comments)    Extreme pain  . Codeine Other (See Comments)    Headache  . Pravastatin Other (See Comments)    Exreme pain  . Statins Other (See Comments)    Extreme pain  . Amitriptyline Other  (See Comments)    Unspecified  . Fish Oil Nausea Only  . Linzess [Linaclotide] Other (See Comments)    Unspecified  . Nuvigil [Armodafinil] Other (See Comments)    Unspecified  . Erythromycin Other (See Comments)    Reaction unspecified  . Erythromycin Base Rash  . Flax Seed [Bio-Flax] Rash    Linseed  . Hydrocodone-Acetaminophen Rash  . Morphine Nausea And Vomiting and Rash  . Nsaids Nausea Only  . Sertraline Rash  . Sinemet [Carbidopa W-Levodopa] Rash  . Sulfamethoxazole Rash    SURGICAL HISTORY She  has a past surgical history that includes Knee susrgery; Shoulder adhesion release; Spine surgery; Appendectomy (1960); Cesarean section ([place]); Abdominal hysterectomy (1992); Rotator cuff repair (Left, 2005); Tonsillectomy; Tonsillectomy (1960); Joint replacement; Toe Surgery; Toe Surgery; and Anterior cervical decompression/discectomy fusion 4 level (N/A, 06/14/2015). FAMILY HISTORY Her family history includes Breast cancer in her mother; Cancer in her mother and sister; Depression in her sister; Diabetes in her mother; Heart disease in her father; Hyperlipidemia in her father; Hypertension in her father; Leukemia in her mother and sister; Lupus in her sister; Osteoporosis in her mother. SOCIAL HISTORY She  reports that she has never smoked. She has never used smokeless tobacco. She reports that she drinks alcohol. She reports  that she does not use drugs.  MEDICARE WELLNESS OBJECTIVES: Physical activity: Current Exercise Habits: The patient does not participate in regular exercise at present, Exercise limited by: orthopedic condition(s) Cardiac risk factors: Cardiac Risk Factors include: advanced age (>36mn, >>47women);dyslipidemia;hypertension;sedentary lifestyle Depression/mood screen:   [facility]  Decreased Interest 0  Down, Depressed, Hopeless 0  PHQ - 2 Score 0  Altered sleeping -  Tired, decreased energy -  Change in appetite -  Feeling  bad or failure about yourself  -  Trouble concentrating -  Moving slowly or fidgety/restless -  Suicidal thoughts -  PHQ-9 Score -    ADLs:  In your present state of health, do you have any difficulty performing the following activities: 01/06/2017 09/08/2016  Hearing? N N  Vision? N N  Difficulty concentrating or making decisions? N N  Walking or climbing stairs? N N  Dressing or bathing? N N  Doing errands, shopping? N N  Some recent data might be hidden     Cognitive Testing  Alert? Yes  Normal Appearance?Yes  Oriented to person? Yes  Place? Yes   Time? Yes  Recall of three objects?  Yes  Can perform simple calculations? Yes  Displays appropriate judgment?Yes  Can read the correct time from a watch face?Yes  EOL planning: Does Patient Have a Medical Advance Directive?: Yes Type of Advance Directive: Healthcare Power of Attorney, Living will Copy of [place] Chart?: No - copy requested   Objective:   Today's Vitals   01/06/17 1456  BP: 118/68  Pulse: 95  Resp: 14  Temp: 97.9 F (36.6 C)  SpO2: 98%  Weight: 169 lb 3.2 oz (76.7 kg)  Height: 5' 3.5" (1.613 m)  PainSc: 5    Body mass index is 29.5 kg/m.  General appearance: alert, no distress, WD/WN,  female HEENT: normocephalic, sclerae anicteric, right ear with pain with pulling on tragus, + erythema, swelling external canal with yellow/white discharge, TMs pearly without bulging or erythema, nares patent, no discharge or erythema, pharynx normal Oral cavity: MMM, no lesions Neck: supple, no lymphadenopathy, no thyromegaly, no masses Heart: RRR, normal S1, S2, no murmurs Lungs: CTA bilaterally, no wheezes, rhonchi, or rales Abdomen: +bs, soft, non tender, non distended, no masses, no hepatomegaly, no splenomegaly Musculoskeletal: nontender, no swelling, no obvious deformity Extremities: no edema, no cyanosis, no clubbing Pulses: 2+ symmetric, upper and lower extremities, normal cap  refill Neurological: alert, oriented x 3, CN2-12 intact, strength normal upper extremities and lower extremities, sensation normal throughout, DTRs 2+ throughout, no cerebellar signs, gait normal Psychiatric: normal affect, behavior normal, pleasant  Breast: defer Gyn: defer Rectal: defer   Medicare Attestation I have personally reviewed: The patient's medical and social history Their use of alcohol, tobacco or illicit drugs Their current medications and supplements The patient's functional ability including ADLs,fall risks, home safety risks, cognitive, and hearing and visual impairment Diet and physical activities Evidence for depression or mood disorders  The patient's weight, height, BMI, and visual acuity have been recorded in the chart.  I have made referrals, counseling, and provided education to the patient based on review of the above and I have provided the patient with a written personalized care plan for preventive services.     AVicie Mutters PA-C   01/07/2017

## 2017-01-06 ENCOUNTER — Ambulatory Visit (INDEPENDENT_AMBULATORY_CARE_PROVIDER_SITE_OTHER): Payer: Medicare Other | Admitting: Physician Assistant

## 2017-01-06 ENCOUNTER — Encounter: Payer: Self-pay | Admitting: Physician Assistant

## 2017-01-06 VITALS — BP 118/68 | HR 95 | Temp 97.9°F | Resp 14 | Ht 63.5 in | Wt 169.2 lb

## 2017-01-06 DIAGNOSIS — K589 Irritable bowel syndrome without diarrhea: Secondary | ICD-10-CM

## 2017-01-06 DIAGNOSIS — Z23 Encounter for immunization: Secondary | ICD-10-CM

## 2017-01-06 DIAGNOSIS — E782 Mixed hyperlipidemia: Secondary | ICD-10-CM

## 2017-01-06 DIAGNOSIS — E119 Type 2 diabetes mellitus without complications: Secondary | ICD-10-CM | POA: Diagnosis not present

## 2017-01-06 DIAGNOSIS — R5383 Other fatigue: Secondary | ICD-10-CM

## 2017-01-06 DIAGNOSIS — K50919 Crohn's disease, unspecified, with unspecified complications: Secondary | ICD-10-CM | POA: Diagnosis not present

## 2017-01-06 DIAGNOSIS — E039 Hypothyroidism, unspecified: Secondary | ICD-10-CM

## 2017-01-06 DIAGNOSIS — I10 Essential (primary) hypertension: Secondary | ICD-10-CM

## 2017-01-06 DIAGNOSIS — K219 Gastro-esophageal reflux disease without esophagitis: Secondary | ICD-10-CM | POA: Diagnosis not present

## 2017-01-06 DIAGNOSIS — M12819 Other specific arthropathies, not elsewhere classified, unspecified shoulder: Secondary | ICD-10-CM | POA: Diagnosis not present

## 2017-01-06 DIAGNOSIS — G4700 Insomnia, unspecified: Secondary | ICD-10-CM

## 2017-01-06 DIAGNOSIS — Z0001 Encounter for general adult medical examination with abnormal findings: Secondary | ICD-10-CM | POA: Diagnosis not present

## 2017-01-06 DIAGNOSIS — G2581 Restless legs syndrome: Secondary | ICD-10-CM

## 2017-01-06 DIAGNOSIS — Z0000 Encounter for general adult medical examination without abnormal findings: Secondary | ICD-10-CM

## 2017-01-06 DIAGNOSIS — J301 Allergic rhinitis due to pollen: Secondary | ICD-10-CM | POA: Diagnosis not present

## 2017-01-06 DIAGNOSIS — H6061 Unspecified chronic otitis externa, right ear: Secondary | ICD-10-CM

## 2017-01-06 DIAGNOSIS — M75100 Unspecified rotator cuff tear or rupture of unspecified shoulder, not specified as traumatic: Secondary | ICD-10-CM

## 2017-01-06 DIAGNOSIS — R6889 Other general symptoms and signs: Secondary | ICD-10-CM | POA: Diagnosis not present

## 2017-01-06 DIAGNOSIS — E559 Vitamin D deficiency, unspecified: Secondary | ICD-10-CM

## 2017-01-06 DIAGNOSIS — I2510 Atherosclerotic heart disease of native coronary artery without angina pectoris: Secondary | ICD-10-CM

## 2017-01-06 DIAGNOSIS — F3341 Major depressive disorder, recurrent, in partial remission: Secondary | ICD-10-CM

## 2017-01-06 DIAGNOSIS — M797 Fibromyalgia: Secondary | ICD-10-CM | POA: Diagnosis not present

## 2017-01-06 DIAGNOSIS — G4733 Obstructive sleep apnea (adult) (pediatric): Secondary | ICD-10-CM

## 2017-01-06 DIAGNOSIS — R269 Unspecified abnormalities of gait and mobility: Secondary | ICD-10-CM

## 2017-01-06 DIAGNOSIS — M542 Cervicalgia: Secondary | ICD-10-CM

## 2017-01-06 DIAGNOSIS — Z79899 Other long term (current) drug therapy: Secondary | ICD-10-CM

## 2017-01-06 DIAGNOSIS — D519 Vitamin B12 deficiency anemia, unspecified: Secondary | ICD-10-CM

## 2017-01-06 MED ORDER — PHENTERMINE HCL 37.5 MG PO TABS: ORAL_TABLET | ORAL | 2 refills | Status: DC

## 2017-01-06 MED ORDER — CIPROFLOXACIN-DEXAMETHASONE 0.3-0.1 % OT SUSP: 4.0000 [drp] | Freq: Two times a day (BID) | OTIC | 0 refills | Status: DC

## 2017-01-06 MED ORDER — LEVOFLOXACIN 500 MG PO TABS: 500.0000 mg | ORAL_TABLET | Freq: Every day | ORAL | 0 refills | Status: DC

## 2017-01-06 NOTE — Progress Notes (Signed)
PHENTERMINE WAS CALLED INTO PHARMACY ON 10TH jULY 2018 BY DD AT 4:38PM

## 2017-01-06 NOTE — Patient Instructions (Signed)
Otitis Externa Otitis externa is an infection of the outer ear canal. The outer ear canal is the area between the outside of the ear and the eardrum. Otitis externa is sometimes called "swimmer's ear." What are the causes? This condition may be caused by:  Swimming in dirty water.  Moisture in the ear.  An injury to the inside of the ear.  An object stuck in the ear.  A cut or scrape on the outside of the ear.  What increases the risk? This condition is more likely to develop in swimmers. What are the signs or symptoms? The first symptom of this condition is often itching in the ear. Later signs and symptoms include:  Swelling of the ear.  Redness in the ear.  Ear pain. The pain may get worse when you pull on your ear.  Pus coming from the ear.  How is this diagnosed? This condition may be diagnosed by examining the ear and testing fluid from the ear for bacteria and funguses. How is this treated? This condition may be treated with:  Antibiotic ear drops. These are often given for 10-14 days.  Medicine to reduce itching and swelling.  Follow these instructions at home:  If you were prescribed antibiotic ear drops, apply them as told by your health care provider. Do not stop using the antibiotic even if your condition improves.  Take over-the-counter and prescription medicines only as told by your health care provider.  Keep all follow-up visits as told by your health care provider. This is important. How is this prevented?  Keep your ear dry. Use the corner of a towel to dry your ear after you swim or bathe.  Avoid scratching or putting things in your ear. Doing these things can damage the ear canal or remove the protective wax that lines it, which makes it easier for bacteria and funguses to grow.  Avoid swimming in lakes, polluted water, or pools that may not have the right amount of chlorine.  Consider making ear drops and putting 3 or 4 drops in each ear after  you swim. Ask your health care provider about how you can make ear drops. Contact a health care provider if:  You have a fever.  After 3 days your ear is still red, swollen, painful, or draining pus.  Your redness, swelling, or pain gets worse.  You have a severe headache.  You have redness, swelling, pain, or tenderness in the area behind your ear. This information is not intended to replace advice given to you by your health care provider. Make sure you discuss any questions you have with your health care provider. Document Released: 06/16/2005 Document Revised: 07/24/2015 Document Reviewed: 03/26/2015 Elsevier Interactive Patient Education  [employer].

## 2017-01-07 LAB — HEPATIC FUNCTION PANEL
ALT: 9 U/L (ref 6–29)
AST: 14 U/L (ref 10–35)
Albumin: 4.2 g/dL (ref 3.6–5.1)
Alkaline Phosphatase: 45 U/L (ref 33–130)
Bilirubin, Direct: 0.1 mg/dL (ref <=0.2)
Indirect Bilirubin: 0.3 mg/dL (ref 0.2–1.2)
Total Bilirubin: 0.4 mg/dL (ref 0.2–1.2)
Total Protein: 6.8 g/dL (ref 6.1–8.1)

## 2017-01-07 LAB — CBC WITH DIFFERENTIAL/PLATELET
Basophils Absolute: 69 cells/uL (ref 0–200)
Basophils Relative: 1 %
Eosinophils Absolute: 207 cells/uL (ref 15–500)
Eosinophils Relative: 3 %
HCT: 40.7 % (ref 35.0–45.0)
Hemoglobin: 13.2 g/dL (ref 11.7–15.5)
Lymphocytes Relative: 20 %
Lymphs Abs: 1380 cells/uL (ref 850–3900)
MCH: 28.7 pg (ref 27.0–33.0)
MCHC: 32.4 g/dL (ref 32.0–36.0)
MCV: 88.5 fL (ref 80.0–100.0)
MPV: 8.9 fL (ref 7.5–12.5)
Monocytes Absolute: 690 cells/uL (ref 200–950)
Monocytes Relative: 10 %
Neutro Abs: 4554 cells/uL (ref 1500–7800)
Neutrophils Relative %: 66 %
Platelets: 283 10*3/uL (ref 140–400)
RBC: 4.6 MIL/uL (ref 3.80–5.10)
RDW: 14.2 % (ref 11.0–15.0)
WBC: 6.9 10*3/uL (ref 3.8–10.8)

## 2017-01-07 LAB — BASIC METABOLIC PANEL WITH GFR
BUN: 20 mg/dL (ref 7–25)
CO2: 25 mmol/L (ref 20–31)
Calcium: 10 mg/dL (ref 8.6–10.4)
Chloride: 103 mmol/L (ref 98–110)
Creat: 1.01 mg/dL — ABNORMAL HIGH (ref 0.50–0.99)
GFR, Est African American: 66 mL/min (ref >=60)
GFR, Est Non African American: 57 mL/min — ABNORMAL LOW (ref >=60)
Glucose, Bld: 112 mg/dL — ABNORMAL HIGH (ref 65–99)
Potassium: 4.5 mmol/L (ref 3.5–5.3)
Sodium: 143 mmol/L (ref 135–146)

## 2017-01-07 LAB — TSH: TSH: 1.53 mIU/L

## 2017-01-07 LAB — HEMOGLOBIN A1C
Hgb A1c MFr Bld: 5.9 % — ABNORMAL HIGH (ref <5.7)
Mean Plasma Glucose: 123 mg/dL

## 2017-01-07 LAB — MAGNESIUM: Magnesium: 2.1 mg/dL (ref 1.5–2.5)

## 2017-01-07 LAB — LIPID PANEL
Cholesterol: 155 mg/dL (ref <200)
HDL: 36 mg/dL — ABNORMAL LOW (ref >50)
LDL Cholesterol: 84 mg/dL (ref <100)
Total CHOL/HDL Ratio: 4.3 Ratio (ref <5.0)
Triglycerides: 177 mg/dL — ABNORMAL HIGH (ref <150)
VLDL: 35 mg/dL — ABNORMAL HIGH (ref <30)

## 2017-01-20 ENCOUNTER — Telehealth: Payer: Self-pay

## 2017-01-20 ENCOUNTER — Other Ambulatory Visit: Payer: Self-pay | Admitting: Physician Assistant

## 2017-01-20 DIAGNOSIS — H6061 Unspecified chronic otitis externa, right ear: Secondary | ICD-10-CM

## 2017-01-20 NOTE — Telephone Encounter (Signed)
Pt reports that her ear infection is coming back after finishing meds. Starting to get yellow drainage in her ears again.  Pt was informed that a referral has been ordered & someone will be getting in contact with her to set that appt up.   Pt agreed & hung up.

## 2017-01-23 DIAGNOSIS — F32A Depression, unspecified: Secondary | ICD-10-CM | POA: Insufficient documentation

## 2017-01-23 DIAGNOSIS — K5000 Crohn's disease of small intestine without complications: Secondary | ICD-10-CM | POA: Insufficient documentation

## 2017-01-23 DIAGNOSIS — G459 Transient cerebral ischemic attack, unspecified: Secondary | ICD-10-CM | POA: Insufficient documentation

## 2017-01-23 DIAGNOSIS — M519 Unspecified thoracic, thoracolumbar and lumbosacral intervertebral disc disorder: Secondary | ICD-10-CM | POA: Insufficient documentation

## 2017-01-23 DIAGNOSIS — M5090 Cervical disc disorder, unspecified, unspecified cervical region: Secondary | ICD-10-CM | POA: Insufficient documentation

## 2017-01-23 DIAGNOSIS — K5900 Constipation, unspecified: Secondary | ICD-10-CM | POA: Insufficient documentation

## 2017-01-26 ENCOUNTER — Encounter: Payer: Self-pay | Admitting: Physician Assistant

## 2017-01-28 ENCOUNTER — Other Ambulatory Visit: Payer: Self-pay | Admitting: Internal Medicine

## 2017-01-28 DIAGNOSIS — G2581 Restless legs syndrome: Secondary | ICD-10-CM

## 2017-02-12 DIAGNOSIS — M180 Bilateral primary osteoarthritis of first carpometacarpal joints: Secondary | ICD-10-CM | POA: Insufficient documentation

## 2017-02-13 ENCOUNTER — Other Ambulatory Visit: Payer: Self-pay | Admitting: Physician Assistant

## 2017-02-19 ENCOUNTER — Other Ambulatory Visit: Payer: Self-pay | Admitting: Internal Medicine

## 2017-02-25 ENCOUNTER — Other Ambulatory Visit: Payer: Self-pay | Admitting: Internal Medicine

## 2017-02-26 ENCOUNTER — Other Ambulatory Visit: Payer: Self-pay | Admitting: Physician Assistant

## 2017-02-26 DIAGNOSIS — H6061 Unspecified chronic otitis externa, right ear: Secondary | ICD-10-CM

## 2017-03-17 ENCOUNTER — Ambulatory Visit: Payer: Self-pay | Admitting: Internal Medicine

## 2017-03-18 ENCOUNTER — Ambulatory Visit (INDEPENDENT_AMBULATORY_CARE_PROVIDER_SITE_OTHER): Payer: Medicare Other | Admitting: Internal Medicine

## 2017-03-18 ENCOUNTER — Encounter: Payer: Self-pay | Admitting: Internal Medicine

## 2017-03-18 ENCOUNTER — Ambulatory Visit: Payer: Self-pay | Admitting: Internal Medicine

## 2017-03-18 VITALS — BP 106/72 | HR 76 | Temp 97.3°F | Resp 18 | Ht 63.5 in | Wt 166.2 lb

## 2017-03-18 DIAGNOSIS — J0410 Acute tracheitis without obstruction: Secondary | ICD-10-CM | POA: Diagnosis not present

## 2017-03-18 MED ORDER — BENZONATATE 200 MG PO CAPS: ORAL_CAPSULE | ORAL | 1 refills | Status: DC

## 2017-03-18 MED ORDER — PREDNISONE 20 MG PO TABS: ORAL_TABLET | ORAL | 0 refills | Status: DC

## 2017-03-18 MED ORDER — PROMETHAZINE-DM 6.25-15 MG/5ML PO SYRP: ORAL_SOLUTION | ORAL | 1 refills | Status: DC

## 2017-03-18 NOTE — Progress Notes (Signed)
Subjective:    Patient ID: Crystal [name], female    DOB: 1950-11-22, 66 y.o.   MRN: [id number]  HPI  Patient was [place] of [place] and given Augmentin x 10 days for a bronchitis. Then 4 days ago on 9/15 she was again treated with Levaquin x 10 days for a possible PNA. She persists with a barking non-productive cough. Denies fevers dyspnea.  Medication Sig  . acyclovir (ZOVIRAX) 800 MG tablet TAKE ONE TABLET BY MOUTH THREE TIMES DAILY WITH MEALS AND 2 NIGHTLY AT BEDTIME.  [name] aspirin EC 81 MG tablet Take 81 mg by mouth daily.  . bisoprolol-hydrochlorothiazide (ZIAC) 10-6.25 MG tablet TAKE ONE TABLET BY MOUTH ONE TIME DAILY FOR BLOOD PRESSURe FILL 07/05/2015  . buPROPion (WELLBUTRIN XL) 300 MG 24 hr tablet TAKE 1 TABLET BY MOUTH EVERY MORNING FOR MOOD AND FOCUS ATTENTION  . cyclobenzaprine (FLEXERIL) 5 MG tablet TAKE 1 TABLET BY MOUTH 2 TIMES DAILY AS NEEDED FOR MUSCLE SPASMS.  . DULoxetine (CYMBALTA) 60 MG capsule TAKE 1 CAPSULE BY MOUTH 2 TIMES A DAY FOR PAIN.  . fenofibrate micronized (LOFIBRA) 134 MG capsule TAKE 1 CAPSULE BY MOUTH DAILY BEFORE BREAKFAST  . fluticasone (FLONASE) 50 MCG/ACT nasal spray USE TWO SPRAYS IN EACH NOSTRIL DAILY AS NEEDED  . gabapentin (NEURONTIN) 300 MG capsule TAKE ONE CAPSULE 3 TIMES A DAY  . IRON PO Take 25 mg by mouth.  . levofloxacin (LEVAQUIN) 500 MG tablet TAKE 1 TABLET EVERY DAY  . levothyroxine (SYNTHROID, LEVOTHROID) 100 MCG tablet Take 100 mcg by mouth daily before breakfast.  . Linaclotide (LINZESS) 290 MCG CAPS capsule Take 290 mcg by mouth daily.  . meclizine (ANTIVERT) 25 MG tablet Take 1 tablet (25 mg total) by mouth 3 (three) times daily as needed for dizziness.  . CORTISPORINotic suspension Place 4 drops into both ears 4 (four) times daily. X 7 days  . oxybutynin  5 MG tablet TAKE 1 TABLET BY MOUTH 2 TIMES DAILY  . phentermine  37.5 MG tablet TAKE 1 TABLET BY MOUTH EVERY DAY BEFORE BREAKFAST  .  MIRALAX   Take 17 g by mouth daily as needed for mild constipation.  . ranitidine  300 MG tablet Take 1 tablet (300 mg total) by mouth at bedtime.  [name] rOPINIRole  2 MG tablet TAKE 1 TABLET 4 TIMES A DAY FOR RESTLESS LEG SYNDROME  . valsartan 80 MG tablet 1/2-1 tablet dailyt for BP, stop losartan  . Vitamin D, Ergocalciferol, (DRISDOL) 50000 units CAPS capsule Take 1 capsule (50,000 Units total) by mouth daily. (Patient taking differently: Take 50,000 Units by mouth daily. Take on Mondays, Wednesday,fridays)  . azelastine (ASTELIN) 0.1 % nasal spray Place 1 spray into both nostrils 2 (two) times daily. Use in each nostril as directed  . ciprofloxacin-dexamethasone (CIPRODEX) OTIC suspension Place 4 drops into the right ear 2 (two) times daily.  [name] dexlansoprazole (DEXILANT) 60 MG capsule Take 1 capsule (60 mg total) by mouth daily.  . metoCLOPramide (REGLAN) 5 MG tablet Take 1 tablet (5 mg total) by mouth 3 (three) times daily before meals.   Allergies  Allergen Reactions  . Atorvastatin Other (See Comments)    Extreme pain  . Codeine Other (See Comments)    Headache  . Pravastatin Other (See Comments)    Exreme pain  . Statins Other (See Comments)    Extreme pain  . Amitriptyline Other (See Comments)    Unspecified  .  Fish Oil Nausea Only  . Linzess [Linaclotide] Other (See Comments)    Unspecified  . Nuvigil [Armodafinil] Other (See Comments)    Unspecified  . Erythromycin Other (See Comments)    Reaction unspecified  . Erythromycin Base Rash  . Flax Seed [Bio-Flax] Rash    Linseed  . Hydrocodone-Acetaminophen Rash  . Morphine Nausea And Vomiting and Rash  . Nsaids Nausea Only  . Sertraline Rash  . Sinemet [Carbidopa W-Levodopa] Rash  . Sulfamethoxazole Rash   Past Medical History:  Diagnosis Date  . Arthritis   . ASCVD (arteriosclerotic cardiovascular disease)   . Depression   . Fibromyalgia   . Gait disorder 10/26/2012  . GERD (gastroesophageal reflux disease)   . GI  bleed   . Hyperlipidemia   . Hypertension   . Hypothyroid   . Restless leg syndrome   . Rhinitis 06/25/2010  . Sleep apnea   . TIA (transient ischemic attack)    3        . Vitamin D deficiency    Past Surgical History:  Procedure Laterality Date  . ABDOMINAL HYSTERECTOMY  1992  . ANTERIOR CERVICAL DECOMPRESSION/DISCECTOMY FUSION 4 LEVELS N/A 06/14/2015   Procedure: Cervical three-four Cervical four-five Cervical five- six Cervical six- seven  Anterior cervical decompression/diskectomy/fusion;  Surgeon: Maeola Harman, MD;  Location: [facility];  Service: Neurosurgery;  Laterality: N/A;  C3-4 C4-5 C5-6 C6-7 Anterior cervical decompression/diskectomy/fusion  . APPENDECTOMY  1960  . CESAREAN SECTION  1983 & 1886  . JOINT REPLACEMENT     2   LEFT  1 RIGHT   . Knee susrgery    . ROTATOR CUFF REPAIR Left 2005   2 LEFT  1  RIGHT  . SHOULDER ADHESION RELEASE    . SPINE SURGERY    . TOE SURGERY     LEFT       . TOE SURGERY     RIGHT  BIG TOE  . TONSILLECTOMY     T+A  . TONSILLECTOMY  1960   Review of Systems  10 point systems review negative except as above.    Objective:   Physical Exam   BP 106/72   Pulse 76   Temp (!) 97.3 F (36.3 C)   Resp 18   Ht 5' 3.5" (1.613 m)   Wt 166 lb 3.2 oz (75.4 kg)   BMI 28.98 kg/m   Hoarse with a barking cough. No stridor. O2 sat 97%.    HEENT - WNL. Neck - supple. No JVD. Chest -  (+) coarse rales/rhonchi at the L posterior base.  Cor - Nl HS. RRR w/o sig MGR. PP 1(+). No edema. MS- FROM w/o deformities.  Gait Nl.    Assessment & Plan:   1. Tracheitis  - complete 1 more week of Levaquin  - benzonatate  200 MG capsule; Take 1 perle 3 x / day to prevent cough  Dispense: 30 capsule; Refill: 1  - PROMETHAZINE-DM ; Take 1 to 2 tsp enery 4 hours if needed for cough  Dispense: 360 mL; Refill: 1  - Recc Mucus-Relief 1,200 mg 2 x/ day   - discussed meds/SE and ROV - prn

## 2017-03-19 ENCOUNTER — Encounter: Payer: Self-pay | Admitting: Internal Medicine

## 2017-03-23 ENCOUNTER — Encounter (HOSPITAL_COMMUNITY): Payer: Self-pay | Admitting: Obstetrics and Gynecology

## 2017-03-23 ENCOUNTER — Inpatient Hospital Stay (HOSPITAL_COMMUNITY)
Admission: EM | Admit: 2017-03-23 | Discharge: 2017-03-26 | DRG: 481 | Disposition: A | Discharge status: Skilled Nursing Facility | Payer: Medicare Other | Attending: Internal Medicine | Admitting: Internal Medicine

## 2017-03-23 ENCOUNTER — Emergency Department (HOSPITAL_COMMUNITY): Payer: Medicare Other

## 2017-03-23 DIAGNOSIS — E559 Vitamin D deficiency, unspecified: Secondary | ICD-10-CM | POA: Diagnosis present

## 2017-03-23 DIAGNOSIS — Y92000 Kitchen of unspecified non-institutional (private) residence as  the place of occurrence of the external cause: Secondary | ICD-10-CM

## 2017-03-23 DIAGNOSIS — M797 Fibromyalgia: Secondary | ICD-10-CM

## 2017-03-23 DIAGNOSIS — K5090 Crohn's disease, unspecified, without complications: Secondary | ICD-10-CM | POA: Diagnosis present

## 2017-03-23 DIAGNOSIS — I2510 Atherosclerotic heart disease of native coronary artery without angina pectoris: Secondary | ICD-10-CM | POA: Diagnosis present

## 2017-03-23 DIAGNOSIS — Z886 Allergy status to analgesic agent status: Secondary | ICD-10-CM | POA: Diagnosis not present

## 2017-03-23 DIAGNOSIS — E876 Hypokalemia: Secondary | ICD-10-CM | POA: Diagnosis present

## 2017-03-23 DIAGNOSIS — S72402A Unspecified fracture of lower end of left femur, initial encounter for closed fracture: Secondary | ICD-10-CM | POA: Diagnosis not present

## 2017-03-23 DIAGNOSIS — Z8673 Personal history of transient ischemic attack (TIA), and cerebral infarction without residual deficits: Secondary | ICD-10-CM

## 2017-03-23 DIAGNOSIS — D62 Acute posthemorrhagic anemia: Secondary | ICD-10-CM | POA: Diagnosis not present

## 2017-03-23 DIAGNOSIS — I10 Essential (primary) hypertension: Secondary | ICD-10-CM | POA: Diagnosis present

## 2017-03-23 DIAGNOSIS — M9712XA Periprosthetic fracture around internal prosthetic left knee joint, initial encounter: Secondary | ICD-10-CM | POA: Diagnosis present

## 2017-03-23 DIAGNOSIS — K219 Gastro-esophageal reflux disease without esophagitis: Secondary | ICD-10-CM | POA: Diagnosis present

## 2017-03-23 DIAGNOSIS — G2581 Restless legs syndrome: Secondary | ICD-10-CM | POA: Diagnosis present

## 2017-03-23 DIAGNOSIS — Z8262 Family history of osteoporosis: Secondary | ICD-10-CM | POA: Diagnosis not present

## 2017-03-23 DIAGNOSIS — S72442A Displaced fracture of lower epiphysis (separation) of left femur, initial encounter for closed fracture: Secondary | ICD-10-CM | POA: Diagnosis not present

## 2017-03-23 DIAGNOSIS — E8889 Other specified metabolic disorders: Secondary | ICD-10-CM | POA: Diagnosis present

## 2017-03-23 DIAGNOSIS — Z882 Allergy status to sulfonamides status: Secondary | ICD-10-CM

## 2017-03-23 DIAGNOSIS — W19XXXA Unspecified fall, initial encounter: Secondary | ICD-10-CM

## 2017-03-23 DIAGNOSIS — E785 Hyperlipidemia, unspecified: Secondary | ICD-10-CM | POA: Diagnosis present

## 2017-03-23 DIAGNOSIS — Y92009 Unspecified place in unspecified non-institutional (private) residence as the place of occurrence of the external cause: Secondary | ICD-10-CM | POA: Diagnosis not present

## 2017-03-23 DIAGNOSIS — F3341 Major depressive disorder, recurrent, in partial remission: Secondary | ICD-10-CM | POA: Diagnosis present

## 2017-03-23 DIAGNOSIS — Z885 Allergy status to narcotic agent status: Secondary | ICD-10-CM

## 2017-03-23 DIAGNOSIS — W010XXA Fall on same level from slipping, tripping and stumbling without subsequent striking against object, initial encounter: Secondary | ICD-10-CM | POA: Diagnosis present

## 2017-03-23 DIAGNOSIS — Z7982 Long term (current) use of aspirin: Secondary | ICD-10-CM | POA: Diagnosis not present

## 2017-03-23 DIAGNOSIS — S72452A Displaced supracondylar fracture without intracondylar extension of lower end of left femur, initial encounter for closed fracture: Principal | ICD-10-CM | POA: Diagnosis present

## 2017-03-23 DIAGNOSIS — Z818 Family history of other mental and behavioral disorders: Secondary | ICD-10-CM

## 2017-03-23 DIAGNOSIS — Z888 Allergy status to other drugs, medicaments and biological substances status: Secondary | ICD-10-CM

## 2017-03-23 DIAGNOSIS — M79605 Pain in left leg: Secondary | ICD-10-CM | POA: Diagnosis present

## 2017-03-23 DIAGNOSIS — G4733 Obstructive sleep apnea (adult) (pediatric): Secondary | ICD-10-CM

## 2017-03-23 DIAGNOSIS — N390 Urinary tract infection, site not specified: Secondary | ICD-10-CM | POA: Diagnosis present

## 2017-03-23 DIAGNOSIS — M978XXA Periprosthetic fracture around other internal prosthetic joint, initial encounter: Secondary | ICD-10-CM

## 2017-03-23 DIAGNOSIS — Z419 Encounter for procedure for purposes other than remedying health state, unspecified: Secondary | ICD-10-CM

## 2017-03-23 DIAGNOSIS — Z96659 Presence of unspecified artificial knee joint: Secondary | ICD-10-CM

## 2017-03-23 DIAGNOSIS — E039 Hypothyroidism, unspecified: Secondary | ICD-10-CM | POA: Diagnosis present

## 2017-03-23 HISTORY — DX: Periprosthetic fracture around other internal prosthetic joint, initial encounter: M97.8XXA

## 2017-03-23 HISTORY — DX: Presence of unspecified artificial knee joint: Z96.659

## 2017-03-23 HISTORY — DX: Periprosthetic fracture around other internal prosthetic joint, initial encounter: Z96.659

## 2017-03-23 LAB — BASIC METABOLIC PANEL
Anion gap: 10 (ref 5–15)
BUN: 24 mg/dL — ABNORMAL HIGH (ref 6–20)
CO2: 29 mmol/L (ref 22–32)
Calcium: 9.2 mg/dL (ref 8.9–10.3)
Chloride: 104 mmol/L (ref 101–111)
Creatinine, Ser: 0.86 mg/dL (ref 0.44–1.00)
GFR calc Af Amer: >60 mL/min (ref >60)
GFR calc non Af Amer: >60 mL/min (ref >60)
Glucose, Bld: 92 mg/dL (ref 65–99)
Potassium: 3 mmol/L — ABNORMAL LOW (ref 3.5–5.1)
Sodium: 143 mmol/L (ref 135–145)

## 2017-03-23 LAB — URINALYSIS, ROUTINE W REFLEX MICROSCOPIC
Bilirubin Urine: NEGATIVE
Glucose, UA: NEGATIVE mg/dL
Hgb urine dipstick: NEGATIVE
Ketones, ur: NEGATIVE mg/dL
Nitrite: NEGATIVE
Protein, ur: NEGATIVE mg/dL
Specific Gravity, Urine: 1.028 (ref 1.005–1.030)
pH: 5 (ref 5.0–8.0)

## 2017-03-23 LAB — ABO/RH: ABO/RH(D): A NEG

## 2017-03-23 LAB — TYPE AND SCREEN
ABO/RH(D): A NEG
Antibody Screen: NEGATIVE

## 2017-03-23 LAB — CBC WITH DIFFERENTIAL/PLATELET
Basophils Absolute: 0 10*3/uL (ref 0.0–0.1)
Basophils Relative: 0 %
Eosinophils Absolute: 0.1 10*3/uL (ref 0.0–0.7)
Eosinophils Relative: 1 %
HCT: 34.3 % — ABNORMAL LOW (ref 36.0–46.0)
Hemoglobin: 11.5 g/dL — ABNORMAL LOW (ref 12.0–15.0)
Lymphocytes Relative: 19 %
Lymphs Abs: 2.5 10*3/uL (ref 0.7–4.0)
MCH: 29.4 pg (ref 26.0–34.0)
MCHC: 33.5 g/dL (ref 30.0–36.0)
MCV: 87.7 fL (ref 78.0–100.0)
Monocytes Absolute: 1 10*3/uL (ref 0.1–1.0)
Monocytes Relative: 8 %
Neutro Abs: 9.5 10*3/uL — ABNORMAL HIGH (ref 1.7–7.7)
Neutrophils Relative %: 72 %
Platelets: 295 10*3/uL (ref 150–400)
RBC: 3.91 MIL/uL (ref 3.87–5.11)
RDW: 14.2 % (ref 11.5–15.5)
WBC: 13.1 10*3/uL — ABNORMAL HIGH (ref 4.0–10.5)

## 2017-03-23 LAB — PROTIME-INR
INR: 1.09
Prothrombin Time: 14 seconds (ref 11.4–15.2)

## 2017-03-23 SURGERY — Surgical Case
Anesthesia: *Unknown

## 2017-03-23 MED ORDER — ONDANSETRON HCL 4 MG/2ML IJ SOLN
4.0000 mg | Freq: Once | INTRAMUSCULAR | Status: AC
  Administered 2017-03-23: 4 mg via INTRAVENOUS
  Filled 2017-03-23: qty 2

## 2017-03-23 MED ORDER — SODIUM CHLORIDE 0.9 % IV SOLN
INTRAVENOUS | Status: DC
  Administered 2017-03-24: 02:00:00 via INTRAVENOUS

## 2017-03-23 MED ORDER — POLYETHYLENE GLYCOL 3350 17 G PO PACK: 17.0000 g | PACK | Freq: Every day | ORAL | Status: DC | PRN

## 2017-03-23 MED ORDER — DEXTROSE 5 % IV SOLN
500.0000 mg | Freq: Four times a day (QID) | INTRAVENOUS | Status: DC | PRN
  Filled 2017-03-23: qty 5

## 2017-03-23 MED ORDER — FENTANYL CITRATE (PF) 100 MCG/2ML IJ SOLN
50.0000 ug | INTRAMUSCULAR | Status: DC | PRN
  Administered 2017-03-23 – 2017-03-26 (×7): 50 ug via INTRAVENOUS
  Filled 2017-03-23 (×7): qty 2

## 2017-03-23 MED ORDER — WHITE PETROLATUM GEL
Status: DC | PRN
  Administered 2017-03-23: 1 via TOPICAL
  Administered 2017-03-24: 19:00:00 via TOPICAL
  Filled 2017-03-23: qty 5
  Filled 2017-03-23: qty 1

## 2017-03-23 MED ORDER — CEFTRIAXONE SODIUM 1 G IJ SOLR
1.0000 g | INTRAMUSCULAR | Status: AC
  Administered 2017-03-24 – 2017-03-25 (×2): 1 g via INTRAVENOUS
  Filled 2017-03-23 (×3): qty 10

## 2017-03-23 MED ORDER — IRBESARTAN 75 MG PO TABS
75.0000 mg | ORAL_TABLET | Freq: Every day | ORAL | Status: DC
  Administered 2017-03-25 – 2017-03-26 (×2): 75 mg via ORAL
  Filled 2017-03-23 (×3): qty 1

## 2017-03-23 MED ORDER — LINACLOTIDE 145 MCG PO CAPS
290.0000 ug | ORAL_CAPSULE | Freq: Every day | ORAL | Status: DC
  Administered 2017-03-25 – 2017-03-26 (×2): 290 ug via ORAL
  Filled 2017-03-23 (×3): qty 2

## 2017-03-23 MED ORDER — ROPINIROLE HCL 1 MG PO TABS
2.0000 mg | ORAL_TABLET | Freq: Three times a day (TID) | ORAL | Status: DC
  Administered 2017-03-24 – 2017-03-26 (×7): 2 mg via ORAL
  Filled 2017-03-23 (×8): qty 2

## 2017-03-23 MED ORDER — MECLIZINE HCL 25 MG PO TABS
25.0000 mg | ORAL_TABLET | Freq: Three times a day (TID) | ORAL | Status: DC | PRN
  Filled 2017-03-23: qty 1

## 2017-03-23 MED ORDER — DULOXETINE HCL 60 MG PO CPEP
60.0000 mg | ORAL_CAPSULE | Freq: Two times a day (BID) | ORAL | Status: DC
  Administered 2017-03-24 – 2017-03-25 (×3): 60 mg via ORAL
  Filled 2017-03-23: qty 2
  Filled 2017-03-23 (×4): qty 1

## 2017-03-23 MED ORDER — POTASSIUM CHLORIDE CRYS ER 20 MEQ PO TBCR
40.0000 meq | EXTENDED_RELEASE_TABLET | ORAL | Status: AC
  Administered 2017-03-23: 40 meq via ORAL
  Filled 2017-03-23: qty 2

## 2017-03-23 MED ORDER — FENTANYL CITRATE (PF) 100 MCG/2ML IJ SOLN
50.0000 ug | INTRAMUSCULAR | Status: AC | PRN
  Administered 2017-03-23 (×2): 50 ug via INTRAVENOUS
  Filled 2017-03-23 (×2): qty 2

## 2017-03-23 MED ORDER — OXYBUTYNIN CHLORIDE 5 MG PO TABS
5.0000 mg | ORAL_TABLET | Freq: Two times a day (BID) | ORAL | Status: DC
  Administered 2017-03-24 – 2017-03-26 (×5): 5 mg via ORAL
  Filled 2017-03-23 (×5): qty 1

## 2017-03-23 MED ORDER — BUPROPION HCL ER (XL) 150 MG PO TB24
300.0000 mg | ORAL_TABLET | Freq: Every day | ORAL | Status: DC
  Administered 2017-03-25 – 2017-03-26 (×2): 300 mg via ORAL
  Filled 2017-03-23 (×2): qty 2

## 2017-03-23 MED ORDER — CEFTRIAXONE SODIUM 1 G IJ SOLR
1.0000 g | Freq: Once | INTRAMUSCULAR | Status: AC
  Administered 2017-03-24: 1 g via INTRAVENOUS
  Filled 2017-03-23: qty 10

## 2017-03-23 MED ORDER — PREDNISONE 20 MG PO TABS
20.0000 mg | ORAL_TABLET | Freq: Every day | ORAL | Status: DC
  Administered 2017-03-24 – 2017-03-26 (×3): 20 mg via ORAL
  Filled 2017-03-23 (×3): qty 1

## 2017-03-23 MED ORDER — METHOCARBAMOL 500 MG PO TABS
500.0000 mg | ORAL_TABLET | Freq: Four times a day (QID) | ORAL | Status: DC | PRN
  Administered 2017-03-24 – 2017-03-26 (×6): 500 mg via ORAL
  Filled 2017-03-23 (×7): qty 1

## 2017-03-23 MED ORDER — RISAQUAD PO CAPS
1.0000 | ORAL_CAPSULE | Freq: Three times a day (TID) | ORAL | Status: DC
  Administered 2017-03-24 – 2017-03-26 (×5): 1 via ORAL
  Filled 2017-03-23 (×6): qty 1

## 2017-03-23 MED ORDER — BENZONATATE 100 MG PO CAPS: 200.0000 mg | ORAL_CAPSULE | Freq: Three times a day (TID) | ORAL | Status: DC | PRN

## 2017-03-23 MED ORDER — FAMOTIDINE 20 MG PO TABS
40.0000 mg | ORAL_TABLET | Freq: Every day | ORAL | Status: DC
  Administered 2017-03-24 – 2017-03-25 (×3): 40 mg via ORAL
  Filled 2017-03-23 (×4): qty 2

## 2017-03-23 MED ORDER — FLUTICASONE PROPIONATE 50 MCG/ACT NA SUSP
2.0000 | Freq: Every day | NASAL | Status: DC | PRN
  Filled 2017-03-23: qty 16

## 2017-03-23 MED ORDER — GABAPENTIN 300 MG PO CAPS
300.0000 mg | ORAL_CAPSULE | Freq: Three times a day (TID) | ORAL | Status: DC
  Administered 2017-03-24 – 2017-03-26 (×7): 300 mg via ORAL
  Filled 2017-03-23 (×7): qty 1

## 2017-03-23 MED ORDER — LEVOTHYROXINE SODIUM 100 MCG PO TABS
100.0000 ug | ORAL_TABLET | Freq: Every day | ORAL | Status: DC
  Administered 2017-03-24 – 2017-03-26 (×3): 100 ug via ORAL
  Filled 2017-03-23 (×3): qty 1

## 2017-03-23 MED ORDER — ENOXAPARIN SODIUM 40 MG/0.4ML ~~LOC~~ SOLN
40.0000 mg | Freq: Every day | SUBCUTANEOUS | Status: DC
  Administered 2017-03-24: 40 mg via SUBCUTANEOUS
  Filled 2017-03-23: qty 0.4

## 2017-03-23 MED ORDER — ASPIRIN EC 81 MG PO TBEC
81.0000 mg | DELAYED_RELEASE_TABLET | Freq: Every day | ORAL | Status: DC
  Administered 2017-03-25: 81 mg via ORAL
  Filled 2017-03-23 (×3): qty 1

## 2017-03-23 MED ORDER — FENOFIBRATE 160 MG PO TABS
160.0000 mg | ORAL_TABLET | Freq: Every day | ORAL | Status: DC
  Administered 2017-03-25 – 2017-03-26 (×2): 160 mg via ORAL
  Filled 2017-03-23 (×3): qty 1

## 2017-03-23 NOTE — ED Notes (Signed)
Pt given sandwich, cheese stick and apple sauce per MD approval.

## 2017-03-23 NOTE — Clinical Social Work Note (Signed)
Clinical Social Work Assessment  Patient Details  Name: Crystal [name] MRN: [id number] Date of Birth: [birth year]  Date of referral:  03/23/17               Reason for consult:  Facility Placement                Permission sought to share information with:  Facility Art therapist granted to share information::  Yes, Verbal Permission Granted  Name::        Agency::     Relationship::     Contact Information:     Housing/Transportation Living arrangements for the past 2 months:  Single Family Home Source of Information:  Patient Patient Interpreter Needed:  None Criminal Activity/Legal Involvement Pertinent to Current Situation/Hospitalization:    Significant Relationships:  Adult Children Lives with:  Self Do you feel safe going back to the place where you live?  Yes Need for family participation in patient care:  No (Coment)  Care giving concerns:  None listed by pt/family   Social Worker assessment / plan:  CSW met with pt and confirmed pt's plan to be discharged to SNF at discharge, if PT recommends.  CSW provided active listening and validated pt's family's concerns that pt go to [facility] if they have a bed and that pt's husband be consulted.   CSW DEPT was given permission to complete FL-2 and send referrals out to SNF facilities in [place] via the hub per pt's request.  Pt has been living independently prior to being admitted to [facility].   Employment status:  Retired Nurse, adult PT Recommendations:  Not assessed at this time Information / Referral to community resources:     Patient/Family's Response to care:  Patient alert and oriented.  Patient agreeable to plan.  Pt's husnand supportive and strongly involved in pt.'s care and pt wishes husband to be involved in decision-making.  Pt pleasant and appreciated CSW intervention.    Patient/Family's Understanding of and Emotional Response to Diagnosis, Current Treatment, and  Prognosis:  Still assessing   Emotional Assessment Appearance:  Appears stated age Attitude/Demeanor/Rapport:    Affect (typically observed):  Accepting, Adaptable, Pleasant, Calm Orientation:  Oriented to Situation, Oriented to  Time, Oriented to Place, Oriented to Self Alcohol / Substance use:    Psych involvement (Current and /or in the community):     Discharge Needs  Concerns to be addressed:  No discharge needs identified Readmission within the last 30 days:  No Current discharge risk:  None Barriers to Discharge:  No Barriers Identified   Claudine Mouton, LCSWA 03/23/2017, 9:55 PM

## 2017-03-23 NOTE — H&P (Signed)
History and Physical    NAKIYAH BEVERLEY [name]:[id number] DOB: Jun 18, 1951 DOA: 03/23/2017  Referring MD/NP/PA: Dr. Isla Pence PCP: Unk Pinto, MD  Patient coming from: Home  Chief Complaint: Fall  HPI: Crystal [name] is a 66 y.o. female with medical history significant of HTN, HLD, TIA,  fibromyalgia, depression, interstitial cystitis, OSA on CPAP, RLS, Crohn's disease, and GERD; presents after having a fall at home. Patient notes that she was in her kitchen at the time when she slipped and fell with her left leg pending underneath her. She reports having excruciating pain and inability to get up or bear weight. She has had previous orthopedic surgeries performed by Dr. Ronnie Derby. Denies any trauma to her head or loss of consciousness.  Patient had been recently treated for suspected bronchitis with 10 days of Augmentin on 8/31, then treated with 10 days of Levaquin on 9/15 for a possible pneumonia. She was seen by her PCP on 9/19, and advised complete 1 more week of Levaquin. Since that time she has completed the Levaquin and her cough has been improvement. They're associated symptoms include increased warmth with urination. She reports previous history of urinary tract infections before in the past.   ED Course: Upon admission into the emergency department patient was seen to be afebrile with vital signs relatively within normal limits. Labs revealed WBC 13.1, hemoglobin 11.5, potassium 3, BUN 24, and creatinine 0.86. Urinalysis revealed signs of moderate leukocytes and 6-30 WBCs. Chest x-ray was otherwise clear. X-rays of the left knee revealed a distal displaced fracture. Dr. Ronnie Derby is the patient's orthopedis,t and they were formally consulted. Dr[facility] Lorre Nick [facility]. Patient had been given 50 mEq of fentanyl and 4 mg of Zofran while in the ED.  Review of Systems  Constitutional: Negative for malaise/fatigue and weight loss.    HENT: Negative for ear discharge and nosebleeds.   Eyes: Negative for photophobia and pain.  Respiratory: Positive for cough (Resolving). Negative for shortness of breath.   Cardiovascular: Negative for chest pain, palpitations and claudication.  Gastrointestinal: Positive for constipation (Chronic) and diarrhea (Chronic). Negative for blood in stool, nausea and vomiting.  Genitourinary: Positive for frequency (Chronic). Negative for hematuria.  Musculoskeletal: Positive for falls, joint pain and myalgias.  Skin: Negative for itching and rash.  Neurological: Negative for sensory change and speech change.  Endo/Heme/Allergies: Positive for environmental allergies. Negative for polydipsia.  Psychiatric/Behavioral: Negative for memory loss and substance abuse.    Past Medical History:  Diagnosis Date  . Arthritis   . ASCVD (arteriosclerotic cardiovascular disease)   . Depression   . Fibromyalgia   . Gait disorder 10/26/2012  . GERD (gastroesophageal reflux disease)   . GI bleed   . Hyperlipidemia   . Hypertension   . Hypothyroid   . Restless leg syndrome   . Rhinitis 06/25/2010  . Sleep apnea   . TIA (transient ischemic attack)    3        . Vitamin D deficiency     Past Surgical History:  Procedure Laterality Date  . ABDOMINAL HYSTERECTOMY  1992  . ANTERIOR CERVICAL DECOMPRESSION/DISCECTOMY FUSION 4 LEVELS N/A 06/14/2015   Procedure: Cervical three-four Cervical four-five Cervical five- six Cervical six- seven  Anterior cervical decompression/diskectomy/fusion;  Surgeon: Erline Levine, MD;  Location: [facility];  Service: Neurosurgery;  Laterality: N/A;  C3-4 C4-5 C5-6 C6-7 Anterior cervical decompression/diskectomy/fusion  . APPENDECTOMY  1960  . [place]  .  JOINT REPLACEMENT     2   LEFT  1 RIGHT   . Knee susrgery    . ROTATOR CUFF REPAIR Left 2005   2 LEFT  1  RIGHT  . SHOULDER ADHESION RELEASE    . SPINE SURGERY    . TOE SURGERY     LEFT        . TOE SURGERY     RIGHT  BIG TOE  . TONSILLECTOMY     T+A  . TONSILLECTOMY  1960     reports that she has never smoked. She has never used smokeless tobacco. She reports that she drinks alcohol. She reports that she does not use drugs.  Allergies  Allergen Reactions  . Atorvastatin Other (See Comments)    Extreme pain  . Codeine Other (See Comments)    Headache  . Pravastatin Other (See Comments)    Exreme pain  . Statins Other (See Comments)    Extreme pain  . Amitriptyline Other (See Comments)    Unspecified  . Fish Oil Nausea Only  . Nuvigil [Armodafinil] Other (See Comments)    Unspecified  . Erythromycin Other (See Comments)    Reaction unspecified  . Erythromycin Base Rash  . Flax Seed [Bio-Flax] Rash    Linseed  . Hydrocodone-Acetaminophen Rash  . Morphine Nausea And Vomiting and Rash  . Nsaids Nausea Only  . Sertraline Rash  . Sinemet [Carbidopa W-Levodopa] Rash  . Sulfamethoxazole Rash    Family History  Problem Relation Age of Onset  . Leukemia Sister   . Lupus Sister   . Cancer Sister        uterus  . Depression Sister   . Heart disease Father   . Hypertension Father   . Hyperlipidemia Father   . Cancer Mother   . Diabetes Mother   . Osteoporosis Mother   . Leukemia Mother   . Breast cancer Mother     Prior to Admission medications   Medication Sig Start Date End Date Taking? Authorizing Provider  acyclovir (ZOVIRAX) 800 MG tablet TAKE ONE TABLET BY MOUTH THREE TIMES DAILY WITH MEALS AND 2 NIGHTLY AT BEDTIME. 03/31/16  Yes Unk Pinto, MD  aspirin EC 81 MG tablet Take 81 mg by mouth daily.   Yes [provider]  benzonatate (TESSALON) 200 MG capsule Take 1 perle 3 x / day to prevent cough 03/18/17  Yes Unk Pinto, MD  bisoprolol-hydrochlorothiazide [facility]) 10-6.25 MG tablet TAKE ONE TABLET BY MOUTH ONE TIME DAILY FOR BLOOD PRESSURE FILL 07/05/2015 02/19/17  Yes Unk Pinto, MD  buPROPion (WELLBUTRIN XL) 300 MG 24 hr tablet  TAKE 1 TABLET BY MOUTH EVERY MORNING FOR MOOD AND FOCUS ATTENTION 09/25/16  Yes Unk Pinto, MD  cyclobenzaprine (FLEXERIL) 5 MG tablet TAKE 1 TABLET BY MOUTH 2 TIMES DAILY AS NEEDED FOR MUSCLE SPASMS. 11/12/16  Yes Unk Pinto, MD  diclofenac sodium (VOLTAREN) 1 % GEL Apply 2 g topically 2 (two) times daily. 12/27/16  Yes [provider]  DULoxetine (CYMBALTA) 60 MG capsule TAKE 1 CAPSULE BY MOUTH 2 TIMES A DAY FOR PAIN. Patient taking differently: TAKE 1 CAPSULE BY MOUTH DAILY FOR PAIN. 10/04/16  Yes Unk Pinto, MD  fenofibrate micronized (LOFIBRA) 134 MG capsule TAKE 1 CAPSULE BY MOUTH DAILY BEFORE BREAKFAST 02/13/17  Yes Unk Pinto, MD  fluticasone [facility]) 50 MCG/ACT nasal spray USE TWO SPRAYS IN EACH NOSTRIL DAILY AS NEEDED 09/02/16  Yes Unk Pinto, MD  gabapentin (NEURONTIN) 300 MG capsule TAKE ONE CAPSULE  3 TIMES A DAY 02/25/17  Yes Unk Pinto, MD  IRON PO Take 25 mg by mouth daily.    Yes [provider]  levothyroxine (SYNTHROID, LEVOTHROID) 100 MCG tablet Take 100 mcg by mouth daily before breakfast.   Yes [provider]  Linaclotide (LINZESS) 290 MCG CAPS capsule Take 290 mcg by mouth daily.   Yes [provider]  meclizine (ANTIVERT) 25 MG tablet Take 1 tablet (25 mg total) by mouth 3 (three) times daily as needed for dizziness. 09/16/16  Yes Unk Pinto, MD  OVER THE COUNTER MEDICATION Take 1 capsule by mouth daily.    Yes [provider]  OVER THE COUNTER MEDICATION Take 1 capsule by mouth daily.    Yes [provider]  oxybutynin (DITROPAN) 5 MG tablet TAKE 1 TABLET BY MOUTH 2 TIMES DAILY 10/25/16  Yes Unk Pinto, MD  phentermine (ADIPEX-P) 37.5 MG tablet TAKE 1 TABLET BY MOUTH EVERY DAY BEFORE BREAKFAST Patient taking differently: Take 37.5 mg by mouth every other day.  01/06/17  Yes Vicie Mutters, PA-C  polyethylene glycol [facility] / GLYCOLAX) packet Take 17 g by mouth daily as needed for  mild constipation. 06/26/15  Yes Domenic Polite, MD  predniSONE (DELTASONE) 20 MG tablet 1 tab 3 x day for 2 days, then 1 tab 2 x day for 2 days, then 1 tab 1 x day for 3 days 03/18/17  Yes Unk Pinto, MD  promethazine-dextromethorphan (PROMETHAZINE-DM) 6.25-15 MG/5ML syrup Take 1 to 2 tsp enery 4 hours if needed for cough Patient taking differently: Take 1 to 2 tsp every 4 hours if needed for cough 03/18/17  Yes Unk Pinto, MD  ranitidine (ZANTAC) 300 MG tablet Take 1 tablet (300 mg total) by mouth at bedtime. 10/21/16  Yes Vicie Mutters, PA-C  rOPINIRole (REQUIP) 2 MG tablet TAKE 1 TABLET 4 TIMES A DAY FOR RESTLESS LEG SYNDROME Patient taking differently: TAKE 1 TABLET 3 TIMES A DAY FOR RESTLESS LEG SYNDROME 01/28/17  Yes Unk Pinto, MD  traMADol (ULTRAM) 50 MG tablet Take 50 mg by mouth every 8 (eight) hours as needed for pain. 12/22/16  Yes [provider]  valsartan (DIOVAN) 80 MG tablet 1/2-1 tablet dailyt for BP, stop losartan Patient taking differently: Take 80 mg by mouth daily.  10/08/16 10/08/17 Yes Vicie Mutters, PA-C  Vitamin D, Ergocalciferol, (DRISDOL) 50000 units CAPS capsule Take 1 capsule (50,000 Units total) by mouth daily. Patient taking differently: Take 50,000 Units by mouth daily. Take on tuesday, Wednesday,thursday 09/03/16  Yes Unk Pinto, MD  levofloxacin (LEVAQUIN) 500 MG tablet TAKE 1 TABLET EVERY DAY Patient not taking: Reported on 03/23/2017 02/26/17   Unk Pinto, MD  neomycin-polymyxin-hydrocortisone (CORTISPORIN) 3.5-10000-1 otic suspension Place 4 drops into both ears 4 (four) times daily. X 7 days Patient not taking: Reported on 03/23/2017 07/30/16   Starlyn Skeans, PA-C    Physical Exam:  Constitutional: Elderly female in moderate discomfort on the hospital gurney  Vitals:   03/23/17 1619 03/23/17 1838  BP: (!) 125/95 116/79  Pulse: 86 94  Resp: 18 14  Temp: 98.4 F (36.9 C)   TempSrc: Oral   SpO2: 97% 98%  Weight:  76.2 kg (168 lb)   Height: 5' 4"  (1.626 m)    Eyes: PERRL, lids and conjunctivae normal ENMT: Mucous membranes are dry. Posterior pharynx clear of any exudate or lesions.  Neck: normal, supple, no masses, no thyromegaly Respiratory: clear to auscultation bilaterally, no wheezing, no crackles. Normal respiratory effort. No accessory muscle  use.  Cardiovascular: Regular rate and rhythm, no murmurs / rubs / gallops. No extremity edema. 2+ pedal pulses. No carotid bruits.  Abdomen: no tenderness, no masses palpated. No hepatosplenomegaly. Bowel sounds positive.  Musculoskeletal: no clubbing / cyanosis. Deformity of the  Skin: no rashes, lesions, ulcers. No induration Neurologic: CN 2-12 grossly intact. Sensation intact, DTR normal. Strength 5/5 in all 4.  Psychiatric: Normal judgment and insight. Alert and oriented x 3. Normal mood.     Labs on Admission: I have personally reviewed following labs and imaging studies  CBC:  Recent Labs Lab 03/23/17 1706  WBC 13.1*  NEUTROABS 9.5*  HGB 11.5*  HCT 34.3*  MCV 87.7  PLT 932   Basic Metabolic Panel:  Recent Labs Lab 03/23/17 1706  NA 143  K 3.0*  CL 104  CO2 29  GLUCOSE 92  BUN 24*  CREATININE 0.86  CALCIUM 9.2   GFR: Estimated Creatinine Clearance: 62.6 mL/min (by C-G formula based on SCr of 0.86 mg/dL). Liver Function Tests: No results for input(s): AST, ALT, ALKPHOS, BILITOT, PROT, ALBUMIN in the last 168 hours. No results for input(s): LIPASE, AMYLASE in the last 168 hours. No results for input(s): AMMONIA in the last 168 hours. Coagulation Profile:  Recent Labs Lab 03/23/17 1706  INR 1.09   Cardiac Enzymes: No results for input(s): CKTOTAL, CKMB, CKMBINDEX, TROPONINI in the last 168 hours. BNP (last 3 results) No results for input(s): PROBNP in the last 8760 hours. HbA1C: No results for input(s): HGBA1C in the last 72 hours. CBG: No results for input(s): GLUCAP in the last 168 hours. Lipid Profile: No  results for input(s): CHOL, HDL, LDLCALC, TRIG, CHOLHDL, LDLDIRECT in the last 72 hours. Thyroid Function Tests: No results for input(s): TSH, T4TOTAL, FREET4, T3FREE, THYROIDAB in the last 72 hours. Anemia Panel: No results for input(s): VITAMINB12, FOLATE, FERRITIN, TIBC, IRON, RETICCTPCT in the last 72 hours. Urine analysis:    Component Value Date/Time   COLORURINE YELLOW 03/23/2017 1706   APPEARANCEUR HAZY (A) 03/23/2017 1706   LABSPEC 1.028 03/23/2017 1706   PHURINE 5.0 03/23/2017 1706   GLUCOSEU NEGATIVE 03/23/2017 1706   HGBUR NEGATIVE 03/23/2017 1706   BILIRUBINUR NEGATIVE 03/23/2017 1706   KETONESUR NEGATIVE 03/23/2017 1706   PROTEINUR NEGATIVE 03/23/2017 1706   UROBILINOGEN 1 05/19/2013 1354   NITRITE NEGATIVE 03/23/2017 1706   LEUKOCYTESUR MODERATE (A) 03/23/2017 1706   Sepsis Labs: No results found for this or any previous visit (from the past 240 hour(s)).   Radiological Exams on Admission: Dg Chest 2 View  Result Date: 03/23/2017 CLINICAL DATA:  Femur fracture EXAM: CHEST  2 VIEW COMPARISON:  None. FINDINGS: Stable asymmetric elevation right hemidiaphragm. The lungs are clear without focal pneumonia, edema, pneumothorax or pleural effusion. The cardiopericardial silhouette is within normal limits for size. The visualized bony structures of the thorax are intact. Incomplete visualization lower cervical fusion. Telemetry leads overlie the chest. IMPRESSION: No active cardiopulmonary disease. Electronically Signed   By: Misty Stanley M.D.   On: 03/23/2017 17:40   Dg Knee 1-2 Views Left  Result Date: 03/23/2017 CLINICAL DATA:  Fall, left leg pain. EXAM: LEFT KNEE - 1-2 VIEW COMPARISON:  None. FINDINGS: AP and lateral views of the left knee are provided. There is a displaced fracture within the distal left femur, centered at the distal femoral metadiaphysis, with approximately 2 cm diastasis at the upper margin of the fracture. The femoral intramedullary stem component of the  left knee arthroplasty appears to extend through  the fracture site, but appears otherwise intact and appropriately positioned. IMPRESSION: 1. Displaced fracture within the distal left femur, acute appearing, centered at the distal metadiaphysis, with approximately 2 cm diastasis at the upper margin of the fracture. 2. Femoral intramedullary stem component of the left knee arthroplasty hardware appears to extend into the fracture site, but appears otherwise intact and appropriately positioned. Inferior portion of the arthroplasty hardware is incompletely imaged but appears intact and appropriately positioned. Electronically Signed   By: Franki Cabot M.D.   On: 03/23/2017 17:47   Dg Hip Unilat With Pelvis 2-3 Views Left  Result Date: 03/23/2017 CLINICAL DATA:  Fall, left leg pain EXAM: DG HIP (WITH OR WITHOUT PELVIS) 2-3V LEFT COMPARISON:  None. FINDINGS: Single-view of the pelvis and two views of the left hip are provided. Osseous alignment is normal. No fracture line or displaced fracture fragment seen. Fusion hardware within the lower lumbar spine is incompletely imaged. Soft tissues about the left hip are unremarkable. IMPRESSION: No acute findings at the left hip. Electronically Signed   By: Franki Cabot M.D.   On: 03/23/2017 17:44   Dg Femur 1v Left  Result Date: 03/23/2017 CLINICAL DATA:  Fall, leg pain. EXAM: LEFT FEMUR 1 VIEW COMPARISON:  None. FINDINGS: Left knee arthroplasty hardware in place, incompletely imaged, femoral intramedullary stem component appearing intact and appropriately positioned. There is a displaced fracture of the distal left femur, of uncertain age, presumably acute based on accompanying plain film of the left knee. Upper femur appears intact and normally aligned. Soft tissues about the left femur are unremarkable. IMPRESSION: 1. Displaced fracture of the distal left femur, acute appearance based on accompanying plain film of the left knee. 2. Femoral component of the left  knee arthroplasty hardware appears intact and appropriately positioned on this exam, extends to the fracture line on accompanying plain film of the knee. Electronically Signed   By: Franki Cabot M.D.   On: 03/23/2017 17:44    EKG: Independently reviewed. Normal sinus rhythm  Assessment/Plan Displaced left femur fracture 2/2 fall: Acute. Patient reports slipping and falling in the kitchen onto her left leg. X-rays revealed displaced fracture of the distal left femur. Dr. Ronnie Derby orthopedics consulted. - Admit to MedSurg bed at [facility] - Hip/femur fracture orders initiated - Fentanyl prn pain - IV fluids normal saline 75 ml/hr - Appreciate orthopedic consultative services, for further recommendation  Leukocytosis: Acute. WBC elevated at 13.1.  - Recheck CBC in a.m.  Suspected urinary tract infection: Acute. UA positive for signs of infection. - Follow-up urine culture - Rocephin IV  Hypokalemia: Acute. Initial potassium 3 on admission. - Given 40 mEq of potassium chloride IV 1 dose now - Continue to monitor and replace as needed  Normocytic normochromic anemia: Hemoglobin 11.5. Baseline previously noted to be 13 back in 12/2016. - Recheck CBC in a.m   Essential hypertension - Continue pharmacy substitution of irbesartan for Diovan  Depression/fibromyalgia - Continue Cymbalta and Wellbutrin  Hypothyroidism: Stable last TSH noted to be 1.53 on 01/06/2017. - Continue levothyroxine   Restless leg syndrome - Continue Requip  OSA on CPAP - CPAP per RT   DVT prophylaxis: Lovenox Code Status: Full  Family Communication:  No family present at bedside Disposition Plan: TBD, but likely need of rehabilitation Consults called: Dr. Ronnie Derby orthopedic surgery  Admission status: inpatient  Norval Morton MD [facility] Pager [phone]   If 7PM-7AM, please contact night-coverage [email or web address] Password [email or web address]  03/23/2017, 7:20 PM

## 2017-03-23 NOTE — ED Notes (Signed)
Bed: PJ12 Expected date:  Expected time:  Means of arrival:  Comments: EMS-knee FX

## 2017-03-23 NOTE — ED Notes (Signed)
Patient able to use bedpan with assistance.

## 2017-03-23 NOTE — ED Provider Notes (Signed)
[facility] DEPT Provider Note   CSN: [id number] Arrival date & time: 03/23/17  1609     History   Chief Complaint Chief Complaint  Patient presents with  . Fall    HPI Crystal [name] is a 66 y.o. female.  Pt presents to the ED today after slipping on some water and falling.  She said her left leg went out from under her when she slipped.  It bent the wrong way.  Pt c/o pain to her whole left leg, but she appears to have a deformity of her left femur.  Pt given 150 mcg of fentanyl en route by EMS. Pt has had multiple orthopedic surgeries and is followed by Dr. Ronnie Derby.      Past Medical History:  Diagnosis Date  . Arthritis   . ASCVD (arteriosclerotic cardiovascular disease)   . Depression   . Fibromyalgia   . Gait disorder 10/26/2012  . GERD (gastroesophageal reflux disease)   . GI bleed   . Hyperlipidemia   . Hypertension   . Hypothyroid   . Restless leg syndrome   . Rhinitis 06/25/2010  . Sleep apnea   . TIA (transient ischemic attack)    3        . Vitamin D deficiency     Patient Active Problem List   Diagnosis Date Noted  . Insomnia 09/10/2016  . B12 deficiency anemia 06/16/2016  . Fatigue 03/26/2016  . Sleep talking 03/26/2016  . IBS (irritable colon syndrome) 08/09/2015  . Spinal stenosis in cervical region 06/14/2015  . Cervical pain 03/06/2015  . Medicare annual wellness visit, subsequent 01/31/2015  . Morbid obesity (BMI 31.88)  10/31/2014  . Medication management 10/25/2013  . Rotator cuff arthropathy 09/15/2013  . Hyperlipidemia   . T2_NIDDM   . Hypothyroid   . Depression, major, recurrent, in partial remission ([place])   . Vitamin D deficiency   . ASCVD   . Gait disorder 10/26/2012  . Rhinitis 06/25/2010  . Obstructive sleep apnea 06/21/2010  . RESTLESS LEG SYNDROME 06/21/2010  . Essential hypertension 06/21/2010  . GERD 06/21/2010  . Crohn's Enterocolitis / Regional enteritis 06/21/2010  . Fibromyalgia 06/21/2010    Past Surgical  History:  Procedure Laterality Date  . ABDOMINAL HYSTERECTOMY  1992  . ANTERIOR CERVICAL DECOMPRESSION/DISCECTOMY FUSION 4 LEVELS N/A 06/14/2015   Procedure: Cervical three-four Cervical four-five Cervical five- six Cervical six- seven  Anterior cervical decompression/diskectomy/fusion;  Surgeon: Erline Levine, MD;  Location: [facility];  Service: Neurosurgery;  Laterality: N/A;  C3-4 C4-5 C5-6 C6-7 Anterior cervical decompression/diskectomy/fusion  . APPENDECTOMY  1960  . [place]  . JOINT REPLACEMENT     2   LEFT  1 RIGHT   . Knee susrgery    . ROTATOR CUFF REPAIR Left 2005   2 LEFT  1  RIGHT  . SHOULDER ADHESION RELEASE    . SPINE SURGERY    . TOE SURGERY     LEFT       . TOE SURGERY     RIGHT  BIG TOE  . TONSILLECTOMY     T+A  . TONSILLECTOMY  1960    OB History    No data available       Home Medications      Family History Family History  Problem Relation Age of Onset  . Leukemia Sister   . Lupus Sister   . Cancer Sister        uterus  . Depression Sister   .  Heart disease Father   . Hypertension Father   . Hyperlipidemia Father   . Cancer Mother   . Diabetes Mother   . Osteoporosis Mother   . Leukemia Mother   . Breast cancer Mother     Social History Social History  Substance Use Topics  . Smoking status: Never Smoker  . Smokeless tobacco: Never Used  . Alcohol use 0.0 oz/week     Comment: rarely     Allergies   Atorvastatin; Codeine; Pravastatin; Statins; Amitriptyline; Fish oil; Nuvigil [armodafinil]; Erythromycin; Erythromycin base; Flax seed [bio-flax]; Hydrocodone-acetaminophen; Morphine; Nsaids; Sertraline; Sinemet [carbidopa w-levodopa]; and Sulfamethoxazole   Review of Systems Review of Systems  Musculoskeletal:       Left leg pain  All other systems reviewed and are negative.    Physical Exam Updated Vital Signs BP 116/79 (BP Location: Right Arm)   Pulse 94   Temp 98.4 F (36.9 C) (Oral)   Resp 14    Ht 5' 4"  (1.626 m)   Wt 76.2 kg (168 lb)   SpO2 98%   BMI 28.84 kg/m   Physical Exam  Constitutional: She is oriented to person, place, and time. She appears well-developed and well-nourished.  HENT:  Head: Normocephalic and atraumatic.  Right Ear: External ear normal.  Left Ear: External ear normal.  Nose: Nose normal.  Mouth/Throat: Oropharynx is clear and moist.  Eyes: Pupils are equal, round, and reactive to light. Conjunctivae and EOM are normal.  Neck: Normal range of motion. Neck supple.  Cardiovascular: Normal rate, regular rhythm, normal heart sounds and intact distal pulses.   Pulmonary/Chest: Effort normal and breath sounds normal.  Abdominal: Soft. Bowel sounds are normal.  Musculoskeletal:       Legs: Pt has tenderness to her whole left leg, but deformity isolated to left thigh.  Neurological: She is alert and oriented to person, place, and time.  Skin: Skin is warm.  Psychiatric: She has a normal mood and affect. Her behavior is normal. Judgment and thought content normal.  Nursing note and vitals reviewed.    ED Treatments / Results  Labs (all labs ordered are listed, but only abnormal results are displayed) Labs Reviewed  BASIC METABOLIC PANEL - Abnormal; Notable for the following:       Result Value   Potassium 3.0 (*)    BUN 24 (*)    All other components within normal limits  CBC WITH DIFFERENTIAL/PLATELET - Abnormal; Notable for the following:    WBC 13.1 (*)    Hemoglobin 11.5 (*)    HCT 34.3 (*)    Neutro Abs 9.5 (*)    All other components within normal limits  URINALYSIS, ROUTINE W REFLEX MICROSCOPIC - Abnormal; Notable for the following:    APPearance HAZY (*)    Leukocytes, UA MODERATE (*)    Bacteria, UA FEW (*)    Squamous Epithelial / LPF 0-5 (*)    All other components within normal limits  PROTIME-INR  TYPE AND SCREEN    EKG  EKG Interpretation  Date/Time:  Monday March 23 2017 16:48:03 EDT Ventricular Rate:  83 PR  Interval:    QRS Duration: 86 QT Interval:  394 QTC Calculation: 463 R Axis:   57 Text Interpretation:  Sinus rhythm Borderline low voltage, extremity leads Confirmed by Isla Pence [phone]) on 03/23/2017 5:39:34 PM       Radiology Dg Chest 2 View  Result Date: 03/23/2017 CLINICAL DATA:  Femur fracture EXAM: CHEST  2 VIEW COMPARISON:  None.  FINDINGS: Stable asymmetric elevation right hemidiaphragm. The lungs are clear without focal pneumonia, edema, pneumothorax or pleural effusion. The cardiopericardial silhouette is within normal limits for size. The visualized bony structures of the thorax are intact. Incomplete visualization lower cervical fusion. Telemetry leads overlie the chest. IMPRESSION: No active cardiopulmonary disease. Electronically Signed   By: Misty Stanley M.D.   On: 03/23/2017 17:40   Dg Knee 1-2 Views Left  Result Date: 03/23/2017 CLINICAL DATA:  Fall, left leg pain. EXAM: LEFT KNEE - 1-2 VIEW COMPARISON:  None. FINDINGS: AP and lateral views of the left knee are provided. There is a displaced fracture within the distal left femur, centered at the distal femoral metadiaphysis, with approximately 2 cm diastasis at the upper margin of the fracture. The femoral intramedullary stem component of the left knee arthroplasty appears to extend through the fracture site, but appears otherwise intact and appropriately positioned. IMPRESSION: 1. Displaced fracture within the distal left femur, acute appearing, centered at the distal metadiaphysis, with approximately 2 cm diastasis at the upper margin of the fracture. 2. Femoral intramedullary stem component of the left knee arthroplasty hardware appears to extend into the fracture site, but appears otherwise intact and appropriately positioned. Inferior portion of the arthroplasty hardware is incompletely imaged but appears intact and appropriately positioned. Electronically Signed   By: Franki Cabot M.D.   On: 03/23/2017 17:47   Dg Hip  Unilat With Pelvis 2-3 Views Left  Result Date: 03/23/2017 CLINICAL DATA:  Fall, left leg pain EXAM: DG HIP (WITH OR WITHOUT PELVIS) 2-3V LEFT COMPARISON:  None. FINDINGS: Single-view of the pelvis and two views of the left hip are provided. Osseous alignment is normal. No fracture line or displaced fracture fragment seen. Fusion hardware within the lower lumbar spine is incompletely imaged. Soft tissues about the left hip are unremarkable. IMPRESSION: No acute findings at the left hip. Electronically Signed   By: Franki Cabot M.D.   On: 03/23/2017 17:44   Dg Femur 1v Left  Result Date: 03/23/2017 CLINICAL DATA:  Fall, leg pain. EXAM: LEFT FEMUR 1 VIEW COMPARISON:  None. FINDINGS: Left knee arthroplasty hardware in place, incompletely imaged, femoral intramedullary stem component appearing intact and appropriately positioned. There is a displaced fracture of the distal left femur, of uncertain age, presumably acute based on accompanying plain film of the left knee. Upper femur appears intact and normally aligned. Soft tissues about the left femur are unremarkable. IMPRESSION: 1. Displaced fracture of the distal left femur, acute appearance based on accompanying plain film of the left knee. 2. Femoral component of the left knee arthroplasty hardware appears intact and appropriately positioned on this exam, extends to the fracture line on accompanying plain film of the knee. Electronically Signed   By: Franki Cabot M.D.   On: 03/23/2017 17:44    Procedures Procedures (including critical care time)  Medications Ordered in ED Medications  fentaNYL (SUBLIMAZE) injection 50 mcg (50 mcg Intravenous Given 03/23/17 1734)  ondansetron (ZOFRAN) injection 4 mg (not administered)  white petrolatum (VASELINE) gel (not administered)     Initial Impression / Assessment and Plan / ED Course  I have reviewed the triage vital signs and the nursing notes.  Pertinent labs & imaging results that were available  during my care of the patient were reviewed by me and considered in my medical decision making (see chart for details).    Pt d/w Dr. Ronnie Derby (pt's ortho of choice) who requests hospitalist admission with transfer to [facility].  Pt  d/w Dr. Tamala Julian ([facility]) who will arrange for transfer to [facility].   Final Clinical Impressions(s) / ED Diagnoses   Final diagnoses:  Fall, initial encounter  Closed fracture of distal end of left femur, unspecified fracture morphology, initial encounter [facility])    New Prescriptions New Prescriptions   No medications on file     Isla Pence, MD 03/23/17 1934

## 2017-03-23 NOTE — ED Notes (Signed)
Patient transported to X-ray 

## 2017-03-23 NOTE — ED Triage Notes (Signed)
Per EMS:Pt is coming from home Pt slipped on water and her leg went out from under her. Pt has pain in her left leg from knee down. Pt has a hx of knee surgery. Pt reports pain a 5/10. Pt has had 150 mcgs of fentanyl.  Deformity noted above knee. EMS able to palpate area. Pulses noted   Last vitals: 141/87 BP HR 90 97% O2 on RA 16 RR  20g LAC

## 2017-03-23 NOTE — Progress Notes (Signed)
Pharmacy Antibiotic Note  Crystal [name] is a 66 y.o. female admitted on 03/23/2017 with UTI.  Pharmacy has been consulted for Ceftriaxone dosing.  Plan: Ceftriaxone 1g IV q24h.  Ceftriaxone does not require renal or hepatic adjustment, so pharmacy will sign off at this time. Please re-consult if further assistance needed.   Height: 5\' 4"  (162.6 cm) Weight: 168 lb (76.2 kg) IBW/kg (Calculated) : 54.7  Temp (24hrs), Avg:98.4 F (36.9 C), Min:98.4 F (36.9 C), Max:98.4 F (36.9 C)   Recent Labs Lab 03/23/17 1706  WBC 13.1*  CREATININE 0.86    Estimated Creatinine Clearance: 62.6 mL/min (by C-G formula based on SCr of 0.86 mg/dL).    Allergies  Allergen Reactions  . Atorvastatin Other (See Comments)    Extreme pain  . Codeine Other (See Comments)    Headache  . Pravastatin Other (See Comments)    Exreme pain  . Statins Other (See Comments)    Extreme pain  . Amitriptyline Other (See Comments)    Unspecified  . Fish Oil Nausea Only  . Nuvigil [Armodafinil] Other (See Comments)    Unspecified  . Erythromycin Other (See Comments)    Reaction unspecified  . Erythromycin Base Rash  . Flax Seed [Bio-Flax] Rash    Linseed  . Hydrocodone-Acetaminophen Rash  . Morphine Nausea And Vomiting and Rash  . Nsaids Nausea Only  . Sertraline Rash  . Sinemet [Carbidopa W-Levodopa] Rash  . Sulfamethoxazole Rash    Antimicrobials this admission: 9/24 >> Ceftriaxone >>  Dose adjustments this admission: --  Microbiology results: 9/24 UCx: sent    Thank you for allowing pharmacy to be a part of this patient's care.   Greer Pickerel, PharmD, BCPS Pager: [phone] 03/23/2017 8:19 PM

## 2017-03-23 NOTE — ED Notes (Signed)
RN going to get blood from IV.

## 2017-03-24 ENCOUNTER — Inpatient Hospital Stay (HOSPITAL_COMMUNITY): Payer: Medicare Other | Admitting: Certified Registered Nurse Anesthetist

## 2017-03-24 ENCOUNTER — Encounter (HOSPITAL_COMMUNITY)
Admission: EM | Disposition: A | Discharge status: Skilled Nursing Facility | Payer: Self-pay | Source: Home / Self Care | Attending: Internal Medicine

## 2017-03-24 ENCOUNTER — Inpatient Hospital Stay (HOSPITAL_COMMUNITY): Payer: Medicare Other

## 2017-03-24 DIAGNOSIS — N390 Urinary tract infection, site not specified: Secondary | ICD-10-CM

## 2017-03-24 HISTORY — PX: ORIF FEMUR FRACTURE: SHX2119

## 2017-03-24 LAB — CBC WITH DIFFERENTIAL/PLATELET
Basophils Absolute: 0 10*3/uL (ref 0.0–0.1)
Basophils Relative: 0 %
Eosinophils Absolute: 0.2 10*3/uL (ref 0.0–0.7)
Eosinophils Relative: 2 %
HCT: 34.4 % — ABNORMAL LOW (ref 36.0–46.0)
Hemoglobin: 11.1 g/dL — ABNORMAL LOW (ref 12.0–15.0)
Lymphocytes Relative: 26 %
Lymphs Abs: 2.3 10*3/uL (ref 0.7–4.0)
MCH: 28.8 pg (ref 26.0–34.0)
MCHC: 32.3 g/dL (ref 30.0–36.0)
MCV: 89.4 fL (ref 78.0–100.0)
Monocytes Absolute: 0.8 10*3/uL (ref 0.1–1.0)
Monocytes Relative: 8 %
Neutro Abs: 5.7 10*3/uL (ref 1.7–7.7)
Neutrophils Relative %: 64 %
Platelets: 269 10*3/uL (ref 150–400)
RBC: 3.85 MIL/uL — ABNORMAL LOW (ref 3.87–5.11)
RDW: 14.5 % (ref 11.5–15.5)
WBC: 9 10*3/uL (ref 4.0–10.5)

## 2017-03-24 LAB — BASIC METABOLIC PANEL
Anion gap: 8 (ref 5–15)
BUN: 26 mg/dL — ABNORMAL HIGH (ref 6–20)
CO2: 31 mmol/L (ref 22–32)
Calcium: 9 mg/dL (ref 8.9–10.3)
Chloride: 106 mmol/L (ref 101–111)
Creatinine, Ser: 0.85 mg/dL (ref 0.44–1.00)
GFR calc Af Amer: >60 mL/min (ref >60)
GFR calc non Af Amer: >60 mL/min (ref >60)
Glucose, Bld: 126 mg/dL — ABNORMAL HIGH (ref 65–99)
Potassium: 3.8 mmol/L (ref 3.5–5.1)
Sodium: 145 mmol/L (ref 135–145)

## 2017-03-24 LAB — CBC
HCT: 28.6 % — ABNORMAL LOW (ref 36.0–46.0)
Hemoglobin: 9.1 g/dL — ABNORMAL LOW (ref 12.0–15.0)
MCH: 28.3 pg (ref 26.0–34.0)
MCHC: 31.8 g/dL (ref 30.0–36.0)
MCV: 89.1 fL (ref 78.0–100.0)
Platelets: 230 10*3/uL (ref 150–400)
RBC: 3.21 MIL/uL — ABNORMAL LOW (ref 3.87–5.11)
RDW: 14.4 % (ref 11.5–15.5)
WBC: 13.3 10*3/uL — ABNORMAL HIGH (ref 4.0–10.5)

## 2017-03-24 LAB — PROTIME-INR
INR: 1.11
Prothrombin Time: 14.2 seconds (ref 11.4–15.2)

## 2017-03-24 LAB — APTT: aPTT: 34 seconds (ref 24–36)

## 2017-03-24 LAB — CREATININE, SERUM
Creatinine, Ser: 0.78 mg/dL (ref 0.44–1.00)
GFR calc Af Amer: >60 mL/min (ref >60)
GFR calc non Af Amer: >60 mL/min (ref >60)

## 2017-03-24 SURGERY — OPEN REDUCTION INTERNAL FIXATION (ORIF) DISTAL FEMUR FRACTURE
Anesthesia: General | Laterality: Left

## 2017-03-24 MED ORDER — CEFAZOLIN SODIUM-DEXTROSE 2-4 GM/100ML-% IV SOLN
INTRAVENOUS | Status: AC
  Filled 2017-03-24: qty 100

## 2017-03-24 MED ORDER — 0.9 % SODIUM CHLORIDE (POUR BTL) OPTIME
TOPICAL | Status: DC | PRN
  Administered 2017-03-24: 1000 mL

## 2017-03-24 MED ORDER — SODIUM CHLORIDE 0.9 % IJ SOLN
INTRAMUSCULAR | Status: AC
  Filled 2017-03-24: qty 10

## 2017-03-24 MED ORDER — METOCLOPRAMIDE HCL 5 MG PO TABS: 5.0000 mg | ORAL_TABLET | Freq: Three times a day (TID) | ORAL | Status: DC | PRN

## 2017-03-24 MED ORDER — TRAMADOL HCL 50 MG PO TABS
50.0000 mg | ORAL_TABLET | Freq: Four times a day (QID) | ORAL | Status: DC | PRN
  Administered 2017-03-25: 50 mg via ORAL
  Administered 2017-03-25 – 2017-03-26 (×2): 100 mg via ORAL
  Filled 2017-03-24: qty 2
  Filled 2017-03-24: qty 1
  Filled 2017-03-24: qty 2

## 2017-03-24 MED ORDER — MENTHOL 3 MG MT LOZG
1.0000 | LOZENGE | OROMUCOSAL | Status: DC | PRN
Start: 2017-03-24 — End: 2017-03-26

## 2017-03-24 MED ORDER — ALBUTEROL SULFATE HFA 108 (90 BASE) MCG/ACT IN AERS
INHALATION_SPRAY | RESPIRATORY_TRACT | Status: DC | PRN
  Administered 2017-03-24: 4 via RESPIRATORY_TRACT

## 2017-03-24 MED ORDER — CEFAZOLIN SODIUM-DEXTROSE 2-4 GM/100ML-% IV SOLN
2.0000 g | Freq: Four times a day (QID) | INTRAVENOUS | Status: AC
  Administered 2017-03-24 – 2017-03-25 (×2): 2 g via INTRAVENOUS
  Filled 2017-03-24 (×2): qty 100

## 2017-03-24 MED ORDER — METOCLOPRAMIDE HCL 5 MG/ML IJ SOLN: 5.0000 mg | Freq: Three times a day (TID) | INTRAMUSCULAR | Status: DC | PRN

## 2017-03-24 MED ORDER — MIDAZOLAM HCL 2 MG/2ML IJ SOLN
INTRAMUSCULAR | Status: DC | PRN
  Administered 2017-03-24: 2 mg via INTRAVENOUS

## 2017-03-24 MED ORDER — ONDANSETRON HCL 4 MG PO TABS: 4.0000 mg | ORAL_TABLET | Freq: Four times a day (QID) | ORAL | Status: DC | PRN

## 2017-03-24 MED ORDER — CEFAZOLIN SODIUM-DEXTROSE 2-4 GM/100ML-% IV SOLN
2.0000 g | INTRAVENOUS | Status: AC
  Administered 2017-03-24: 2 g via INTRAVENOUS

## 2017-03-24 MED ORDER — MIDAZOLAM HCL 2 MG/2ML IJ SOLN
INTRAMUSCULAR | Status: AC
  Filled 2017-03-24: qty 2

## 2017-03-24 MED ORDER — PROPOFOL 10 MG/ML IV BOLUS
INTRAVENOUS | Status: AC
  Filled 2017-03-24: qty 20

## 2017-03-24 MED ORDER — ONDANSETRON HCL 4 MG/2ML IJ SOLN: 4.0000 mg | Freq: Once | INTRAMUSCULAR | Status: DC | PRN

## 2017-03-24 MED ORDER — ONDANSETRON HCL 4 MG/2ML IJ SOLN: 4.0000 mg | Freq: Four times a day (QID) | INTRAMUSCULAR | Status: DC | PRN

## 2017-03-24 MED ORDER — OXYCODONE-ACETAMINOPHEN 5-325 MG PO TABS
ORAL_TABLET | ORAL | Status: AC
  Filled 2017-03-24: qty 1

## 2017-03-24 MED ORDER — ACETAMINOPHEN 325 MG PO TABS: 650.0000 mg | ORAL_TABLET | Freq: Four times a day (QID) | ORAL | Status: DC | PRN

## 2017-03-24 MED ORDER — SUGAMMADEX SODIUM 200 MG/2ML IV SOLN
INTRAVENOUS | Status: AC
  Filled 2017-03-24: qty 2

## 2017-03-24 MED ORDER — ONDANSETRON HCL 4 MG/2ML IJ SOLN
INTRAMUSCULAR | Status: DC | PRN
  Administered 2017-03-24: 4 mg via INTRAVENOUS

## 2017-03-24 MED ORDER — LACTATED RINGERS IV SOLN
INTRAVENOUS | Status: DC | PRN
  Administered 2017-03-24 (×3): via INTRAVENOUS

## 2017-03-24 MED ORDER — ACETAMINOPHEN 650 MG RE SUPP: 650.0000 mg | Freq: Four times a day (QID) | RECTAL | Status: DC | PRN

## 2017-03-24 MED ORDER — PHENOL 1.4 % MT LIQD: 1.0000 | OROMUCOSAL | Status: DC | PRN

## 2017-03-24 MED ORDER — FENTANYL CITRATE (PF) 250 MCG/5ML IJ SOLN
INTRAMUSCULAR | Status: DC | PRN
  Administered 2017-03-24 (×2): 50 ug via INTRAVENOUS
  Administered 2017-03-24: 100 ug via INTRAVENOUS

## 2017-03-24 MED ORDER — ALBUMIN HUMAN 5 % IV SOLN
INTRAVENOUS | Status: DC | PRN
  Administered 2017-03-24 (×2): via INTRAVENOUS

## 2017-03-24 MED ORDER — PROPOFOL 10 MG/ML IV BOLUS
INTRAVENOUS | Status: DC | PRN
  Administered 2017-03-24: 150 mg via INTRAVENOUS

## 2017-03-24 MED ORDER — ROCURONIUM BROMIDE 10 MG/ML (PF) SYRINGE
PREFILLED_SYRINGE | INTRAVENOUS | Status: DC | PRN
  Administered 2017-03-24: 20 mg via INTRAVENOUS
  Administered 2017-03-24: 50 mg via INTRAVENOUS
  Administered 2017-03-24: 20 mg via INTRAVENOUS
  Administered 2017-03-24: 10 mg via INTRAVENOUS

## 2017-03-24 MED ORDER — KETAMINE HCL-SODIUM CHLORIDE 100-0.9 MG/10ML-% IV SOSY
PREFILLED_SYRINGE | INTRAVENOUS | Status: AC
  Filled 2017-03-24: qty 10

## 2017-03-24 MED ORDER — PHENYLEPHRINE 40 MCG/ML (10ML) SYRINGE FOR IV PUSH (FOR BLOOD PRESSURE SUPPORT)
PREFILLED_SYRINGE | INTRAVENOUS | Status: DC | PRN
  Administered 2017-03-24: 80 ug via INTRAVENOUS
  Administered 2017-03-24: 120 ug via INTRAVENOUS
  Administered 2017-03-24: 40 ug via INTRAVENOUS
  Administered 2017-03-24 (×2): 80 ug via INTRAVENOUS
  Administered 2017-03-24: 40 ug via INTRAVENOUS
  Administered 2017-03-24: 120 ug via INTRAVENOUS

## 2017-03-24 MED ORDER — LORAZEPAM 2 MG/ML IJ SOLN
0.5000 mg | Freq: Once | INTRAMUSCULAR | Status: AC
  Administered 2017-03-24: 0.5 mg via INTRAVENOUS
  Filled 2017-03-24: qty 1

## 2017-03-24 MED ORDER — PHENYLEPHRINE HCL 10 MG/ML IJ SOLN
INTRAVENOUS | Status: DC | PRN
  Administered 2017-03-24: 10 ug/min via INTRAVENOUS

## 2017-03-24 MED ORDER — FENTANYL CITRATE (PF) 250 MCG/5ML IJ SOLN
INTRAMUSCULAR | Status: AC
  Filled 2017-03-24: qty 5

## 2017-03-24 MED ORDER — DEXMEDETOMIDINE HCL 200 MCG/2ML IV SOLN
INTRAVENOUS | Status: DC | PRN
  Administered 2017-03-24: 40 ug via INTRAVENOUS

## 2017-03-24 MED ORDER — EPHEDRINE SULFATE 50 MG/ML IJ SOLN
INTRAMUSCULAR | Status: AC
  Filled 2017-03-24: qty 1

## 2017-03-24 MED ORDER — POVIDONE-IODINE 10 % EX SWAB: 2.0000 "application " | Freq: Once | CUTANEOUS | Status: DC

## 2017-03-24 MED ORDER — FENTANYL CITRATE (PF) 100 MCG/2ML IJ SOLN: 25.0000 ug | INTRAMUSCULAR | Status: DC | PRN

## 2017-03-24 MED ORDER — SUGAMMADEX SODIUM 200 MG/2ML IV SOLN
INTRAVENOUS | Status: DC | PRN
  Administered 2017-03-24: 200 mg via INTRAVENOUS

## 2017-03-24 MED ORDER — FENTANYL CITRATE (PF) 100 MCG/2ML IJ SOLN: 50.0000 ug | INTRAMUSCULAR | Status: DC | PRN

## 2017-03-24 MED ORDER — ENOXAPARIN SODIUM 40 MG/0.4ML ~~LOC~~ SOLN
40.0000 mg | SUBCUTANEOUS | Status: DC
  Administered 2017-03-25 – 2017-03-26 (×2): 40 mg via SUBCUTANEOUS
  Filled 2017-03-24 (×2): qty 0.4

## 2017-03-24 MED ORDER — CHLORHEXIDINE GLUCONATE 4 % EX LIQD: 60.0000 mL | Freq: Once | CUTANEOUS | Status: DC

## 2017-03-24 MED ORDER — DEXAMETHASONE SODIUM PHOSPHATE 10 MG/ML IJ SOLN
INTRAMUSCULAR | Status: AC
  Filled 2017-03-24: qty 1

## 2017-03-24 MED ORDER — LIDOCAINE 2% (20 MG/ML) 5 ML SYRINGE
INTRAMUSCULAR | Status: DC | PRN
  Administered 2017-03-24: 60 mg via INTRAVENOUS

## 2017-03-24 MED ORDER — KETAMINE HCL 10 MG/ML IJ SOLN
INTRAMUSCULAR | Status: DC | PRN
  Administered 2017-03-24: 20 mg via INTRAVENOUS

## 2017-03-24 MED ORDER — PHENYLEPHRINE 40 MCG/ML (10ML) SYRINGE FOR IV PUSH (FOR BLOOD PRESSURE SUPPORT)
PREFILLED_SYRINGE | INTRAVENOUS | Status: AC
  Filled 2017-03-24: qty 10

## 2017-03-24 MED ORDER — DEXAMETHASONE SODIUM PHOSPHATE 10 MG/ML IJ SOLN
INTRAMUSCULAR | Status: DC | PRN
  Administered 2017-03-24: 10 mg via INTRAVENOUS

## 2017-03-24 MED ORDER — OXYCODONE-ACETAMINOPHEN 5-325 MG PO TABS
1.0000 | ORAL_TABLET | Freq: Four times a day (QID) | ORAL | Status: DC | PRN
  Administered 2017-03-24: 2 via ORAL
  Administered 2017-03-25: 1 via ORAL
  Filled 2017-03-24: qty 1

## 2017-03-24 SURGICAL SUPPLY — 84 items
BANDAGE ACE 4X5 VEL STRL LF (GAUZE/BANDAGES/DRESSINGS) ×2 IMPLANT
BANDAGE ACE 6X5 VEL STRL LF (GAUZE/BANDAGES/DRESSINGS) ×2 IMPLANT
BIT DRILL 4.3 (BIT) ×2 IMPLANT
BIT DRILL 4.3X300MM (BIT) IMPLANT
BIT DRILL QC 3.3X195 (BIT) ×1 IMPLANT
BLADE CLIPPER SURG (BLADE) IMPLANT
BNDG GAUZE ELAST 4 BULKY (GAUZE/BANDAGES/DRESSINGS) ×1 IMPLANT
BONE CANC CHIPS 40CC CAN1/2 (Bone Implant) ×2 IMPLANT
BRUSH SCRUB SURG 4.25 DISP (MISCELLANEOUS) ×4 IMPLANT
CABLE CERLAGE W/CRIMP 1.8 (Cable) ×1 IMPLANT
CANISTER SUCT 3000ML PPV (MISCELLANEOUS) ×2 IMPLANT
CAP LOCK NCB (Cap) ×11 IMPLANT
CHIPS CANC BONE 40CC CAN1/2 (Bone Implant) ×1 IMPLANT
COVER SURGICAL LIGHT HANDLE (MISCELLANEOUS) ×2 IMPLANT
DRAPE C-ARM 42X72 X-RAY (DRAPES) ×2 IMPLANT
DRAPE C-ARMOR (DRAPES) ×2 IMPLANT
DRAPE IMP U-DRAPE 54X76 (DRAPES) ×2 IMPLANT
DRAPE ORTHO SPLIT 77X108 STRL (DRAPES) ×6
DRAPE SURG ORHT 6 SPLT 77X108 (DRAPES) ×3 IMPLANT
DRAPE U-SHAPE 47X51 STRL (DRAPES) ×2 IMPLANT
DRILL BIT 4.3 (BIT) ×2
DRSG ADAPTIC 3X8 NADH LF (GAUZE/BANDAGES/DRESSINGS) ×1 IMPLANT
DRSG MEPILEX BORDER 4X12 (GAUZE/BANDAGES/DRESSINGS) ×1 IMPLANT
DRSG MEPILEX BORDER 4X4 (GAUZE/BANDAGES/DRESSINGS) ×1 IMPLANT
DRSG MEPITEL 4X7.2 (GAUZE/BANDAGES/DRESSINGS) ×2 IMPLANT
DRSG PAD ABDOMINAL 8X10 ST (GAUZE/BANDAGES/DRESSINGS) ×4 IMPLANT
ELECT REM PT RETURN 9FT ADLT (ELECTROSURGICAL) ×2
ELECTRODE REM PT RTRN 9FT ADLT (ELECTROSURGICAL) ×1 IMPLANT
EVACUATOR 1/8 PVC DRAIN (DRAIN) IMPLANT
EVACUATOR 3/16  PVC DRAIN (DRAIN)
EVACUATOR 3/16 PVC DRAIN (DRAIN) IMPLANT
GAUZE SPONGE 4X4 12PLY STRL (GAUZE/BANDAGES/DRESSINGS) ×1 IMPLANT
GLOVE BIO SURGEON STRL SZ7.5 (GLOVE) ×2 IMPLANT
GLOVE BIO SURGEON STRL SZ8 (GLOVE) ×2 IMPLANT
GLOVE BIOGEL PI IND STRL 7.5 (GLOVE) ×1 IMPLANT
GLOVE BIOGEL PI IND STRL 8 (GLOVE) ×1 IMPLANT
GLOVE BIOGEL PI INDICATOR 7.5 (GLOVE) ×1
GLOVE BIOGEL PI INDICATOR 8 (GLOVE) ×1
GOWN STRL REUS W/ TWL LRG LVL3 (GOWN DISPOSABLE) ×2 IMPLANT
GOWN STRL REUS W/ TWL XL LVL3 (GOWN DISPOSABLE) ×1 IMPLANT
GOWN STRL REUS W/TWL LRG LVL3 (GOWN DISPOSABLE) ×4
GOWN STRL REUS W/TWL XL LVL3 (GOWN DISPOSABLE) ×2
GRAFT BNE CHIP CANC 1-8 40 (Bone Implant) IMPLANT
K-WIRE 2.0 (WIRE) ×6
K-WIRE FXSTD 280X2XNS SS (WIRE) ×3
KIT BASIN OR (CUSTOM PROCEDURE TRAY) ×2 IMPLANT
KIT INFUSE LRG II (Orthopedic Implant) ×1 IMPLANT
KIT ROOM TURNOVER OR (KITS) ×2 IMPLANT
KWIRE FXSTD 280X2XNS SS (WIRE) IMPLANT
LOCKPLATE CABLE BUTTON NCP HIP (Orthopedic Implant) ×1 IMPLANT
NEEDLE 22X1 1/2 (OR ONLY) (NEEDLE) IMPLANT
NS IRRIG 1000ML POUR BTL (IV SOLUTION) ×2 IMPLANT
PACK TOTAL JOINT (CUSTOM PROCEDURE TRAY) ×2 IMPLANT
PACK UNIVERSAL I (CUSTOM PROCEDURE TRAY) ×2 IMPLANT
PAD ARMBOARD 7.5X6 YLW CONV (MISCELLANEOUS) ×4 IMPLANT
PAD CAST 4YDX4 CTTN HI CHSV (CAST SUPPLIES) ×1 IMPLANT
PADDING CAST COTTON 4X4 STRL (CAST SUPPLIES) ×2
PADDING CAST COTTON 6X4 STRL (CAST SUPPLIES) ×2 IMPLANT
PLATE NCB 15H HIP (Plate) ×1 IMPLANT
SCREW CORT NCB SELFTAP 5.0X50 (Screw) ×1 IMPLANT
SCREW CORTICAL NCB 5.0X65 (Screw) ×1 IMPLANT
SCREW NCB 5.0X26MM (Screw) ×2 IMPLANT
SCREW NCB 5.0X28MM (Screw) ×1 IMPLANT
SCREW NCB 5.0X30MM (Screw) ×1 IMPLANT
SCREW NCB 5.0X34MM (Screw) ×2 IMPLANT
SCREW NCB 5.0X36MM (Screw) ×2 IMPLANT
SPONGE LAP 18X18 X RAY DECT (DISPOSABLE) ×2 IMPLANT
STAPLER VISISTAT 35W (STAPLE) ×2 IMPLANT
SUCTION FRAZIER HANDLE 10FR (MISCELLANEOUS) ×1
SUCTION TUBE FRAZIER 10FR DISP (MISCELLANEOUS) ×1 IMPLANT
SUT ETHILON 2 0 PSLX (SUTURE) ×2 IMPLANT
SUT PROLENE 0 CT 2 (SUTURE) IMPLANT
SUT VIC AB 0 CT1 27 (SUTURE) ×4
SUT VIC AB 0 CT1 27XBRD ANBCTR (SUTURE) ×2 IMPLANT
SUT VIC AB 1 CT1 27 (SUTURE) ×6
SUT VIC AB 1 CT1 27XBRD ANBCTR (SUTURE) ×2 IMPLANT
SUT VIC AB 2-0 CT1 27 (SUTURE) ×6
SUT VIC AB 2-0 CT1 TAPERPNT 27 (SUTURE) ×2 IMPLANT
SYR 20ML ECCENTRIC (SYRINGE) IMPLANT
TOWEL OR 17X24 6PK STRL BLUE (TOWEL DISPOSABLE) ×2 IMPLANT
TOWEL OR 17X26 10 PK STRL BLUE (TOWEL DISPOSABLE) ×4 IMPLANT
TRAY FOLEY W/METER SILVER 16FR (SET/KITS/TRAYS/PACK) ×1 IMPLANT
WATER STERILE IRR 1000ML POUR (IV SOLUTION) ×4 IMPLANT
YANKAUER SUCT BULB TIP NO VENT (SUCTIONS) ×1 IMPLANT

## 2017-03-24 NOTE — ED Notes (Signed)
Pt requested head of bed to be flat.

## 2017-03-24 NOTE — Progress Notes (Signed)
PROGRESS NOTE    Crystal [name]  OZD:[id number] DOB: 01-23-1951 DOA: 03/23/2017 PCP: Unk Pinto, MD    Brief Narrative:  66 year old female presented after a mechanical fall, she does have medical history of hypertension, fibromyalgia, depression OSA and Crohn's disease. Apparently she slipped while walking, fell from her old height, no head trauma, experienced excruciating pain in inability to bear weight after trauma. The physical examination blood pressure 125/95, heart rate 86, respiratory 18, temperature 98.4, obstruction isn't present, dry mucous membranes, lungs clear to incision bilaterally, no wheezing or rales,, heart S1-S2 present rhythmic, the abdomen was soft, nontender, no lower extremity edema. Patient showed displaced left femur fracture.  Patient admitted to the hospital working diagnosis of left femoral fracture.    Assessment & Plan:   Principal Problem:   Displaced fracture of distal epiphysis of left femur (HCC) Active Problems:   Obstructive sleep apnea   RESTLESS LEG SYNDROME   Essential hypertension   Hypothyroid   Depression, major, recurrent, in partial remission ([place])   Fall at home, initial encounter   Hypokalemia   1. Left femur fracture. Patient was transferred to [facility] for surgical intervention that she tolerated well, continue pain control and physical therapy and orthopedics recommendations  2. Urinary tract infection. Will continue antibiotic therapy with ceftriaxone iV, will follow on cultures, cell count and temperature curve.   3. Hypokalemia. Continue repletion with kcl, follow renal panel in am.   4. Hypertension. Continue antihypertensive agents. Continue with irbersartan,   5. Hypothyroidism. Continue levothyroxine  6. Crohn's disease. Stable with no signs of exacerbation   DVT prophylaxis: scd  Code Status: full Family Communication:  Disposition Plan:    Consultants:   Orthopedics  Procedures:      Antimicrobials:       Subjective: Patient with pain at the right lower extremity, worse with movement and improved with analgesics. Positive fatigue.   Objective: Vitals:   03/24/17 1624 03/24/17 1630 03/24/17 1645 03/24/17 1731  BP:  104/62 105/62 116/70  Pulse: 87 94 90 89  Resp: 17 15 15 15   Temp: (!) 97.1 F (36.2 C)   (!) 97.4 F (36.3 C)  TempSrc:    Oral  SpO2: 91% 93% 94% 100%  Weight:      Height:        Intake/Output Summary (Last 24 hours) at 03/24/17 1825 Last data filed at 03/24/17 1619  Gross per 24 hour  Intake             2500 ml  Output             1325 ml  Net             1175 ml   Filed Weights   03/23/17 1619  Weight: 76.2 kg (168 lb)    Examination:  General: Not in pain or dyspnea. deconditioned Neurology: Awake and alert, non focal  E ENT: no pallor, no icterus, oral mucosa moist Cardiovascular: No JVD. S1-S2 present, rhythmic, no gallops, rubs, or murmurs. No lower extremity edema. Pulmonary: vesicular breath sounds bilaterally, adequate air movement, no wheezing, rhonchi or rales. Gastrointestinal. Abdomen flat, no organomegaly, non tender, no rebound or guarding Skin. No rashes Musculoskeletal: right lower extremity with postsurgical dressing in place.      Data Reviewed: I have personally reviewed following labs and imaging studies  CBC:  Recent Labs Lab 03/23/17 1706 03/24/17 0501  WBC 13.1* 9.0  NEUTROABS 9.5* 5.7  HGB 11.5* 11.1*  HCT 34.3*  34.4*  MCV 87.7 89.4  PLT 295 007   Basic Metabolic Panel:  Recent Labs Lab 03/23/17 1706 03/24/17 0501  NA 143 145  K 3.0* 3.8  CL 104 106  CO2 29 31  GLUCOSE 92 126*  BUN 24* 26*  CREATININE 0.86 0.85  CALCIUM 9.2 9.0   GFR: Estimated Creatinine Clearance: 63.3 mL/min (by C-G formula based on SCr of 0.85 mg/dL). Liver Function Tests: No results for input(s): AST, ALT, ALKPHOS, BILITOT, PROT, ALBUMIN in the last 168 hours. No results for input(s): LIPASE,  AMYLASE in the last 168 hours. No results for input(s): AMMONIA in the last 168 hours. Coagulation Profile:  Recent Labs Lab 03/23/17 1706 03/24/17 0501  INR 1.09 1.11   Cardiac Enzymes: No results for input(s): CKTOTAL, CKMB, CKMBINDEX, TROPONINI in the last 168 hours. BNP (last 3 results) No results for input(s): PROBNP in the last 8760 hours. HbA1C: No results for input(s): HGBA1C in the last 72 hours. CBG: No results for input(s): GLUCAP in the last 168 hours. Lipid Profile: No results for input(s): CHOL, HDL, LDLCALC, TRIG, CHOLHDL, LDLDIRECT in the last 72 hours. Thyroid Function Tests: No results for input(s): TSH, T4TOTAL, FREET4, T3FREE, THYROIDAB in the last 72 hours. Anemia Panel: No results for input(s): VITAMINB12, FOLATE, FERRITIN, TIBC, IRON, RETICCTPCT in the last 72 hours.    Radiology Studies: I have reviewed all of the imaging during this hospital visit personally     Scheduled Meds: . acidophilus  1 capsule Oral TID  . aspirin EC  81 mg Oral Daily  . buPROPion  300 mg Oral Daily  . DULoxetine  60 mg Oral BID  . [START ON 03/25/2017] enoxaparin (LOVENOX) injection  40 mg Subcutaneous Q24H  . famotidine  40 mg Oral QHS  . fenofibrate  160 mg Oral Daily  . gabapentin  300 mg Oral TID  . irbesartan  75 mg Oral Daily  . levothyroxine  100 mcg Oral QAC breakfast  . linaclotide  290 mcg Oral Daily  . oxybutynin  5 mg Oral BID  . oxyCODONE-acetaminophen      . oxyCODONE-acetaminophen      . predniSONE  20 mg Oral Q breakfast  . rOPINIRole  2 mg Oral TID   Continuous Infusions: . sodium chloride 75 mL/hr at 03/24/17 0154  .  ceFAZolin (ANCEF) IV    . cefTRIAXone (ROCEPHIN)  IV    . methocarbamol (ROBAXIN)  IV       LOS: 1 day        Sukhman Kocher Gerome Apley, MD [facility] Pager [phone]

## 2017-03-24 NOTE — Progress Notes (Signed)
Spoke with pt, who is currently in the ED, regarding cpap.  Pt did not want to go on cpap at this time stating she may be going to [facility] soon.  Pt was advised to call whenever she is ready for bed/cpap.

## 2017-03-24 NOTE — Anesthesia Preprocedure Evaluation (Addendum)
Anesthesia Evaluation  Patient identified by MRN, date of birth, ID band Patient awake    Reviewed: Allergy & Precautions, NPO status , Patient's Chart, lab work & pertinent test results  Airway Mallampati: II  TM Distance: >3 FB Neck ROM: Full    Dental  (+) Teeth Intact, Dental Advisory Given   Pulmonary    breath sounds clear to auscultation       Cardiovascular hypertension,  Rhythm:Regular Rate:Normal     Neuro/Psych    GI/Hepatic   Endo/Other    Renal/GU      Musculoskeletal   Abdominal   Peds  Hematology   Anesthesia Other Findings   Reproductive/Obstetrics                             Anesthesia Physical Anesthesia Plan  ASA: III  Anesthesia Plan: General   Post-op Pain Management:    Induction: Intravenous  PONV Risk Score and Plan: Ondansetron and Dexamethasone  Airway Management Planned: Oral ETT  Additional Equipment:   Intra-op Plan:   Post-operative Plan: Extubation in OR  Informed Consent:   Dental advisory given  Plan Discussed with: CRNA and Anesthesiologist  Anesthesia Plan Comments:         Anesthesia Quick Evaluation

## 2017-03-24 NOTE — Progress Notes (Signed)
RT placed patient on CPAP unit for the night. Patient stated she could not wear our machine due to the discomfort in the mask. RT informed patient she could have husband bring her machine from home so she could wear it during her stay. Will continue to monitor.

## 2017-03-24 NOTE — Brief Op Note (Signed)
03/23/2017 - 03/24/2017  2:41 PM  PATIENT:  Crystal [name]  66 y.o. female  PRE-OPERATIVE DIAGNOSIS: LEFT PERIPROSTHETIC DISTAL FEMUR FRACTURE  POST-OPERATIVE DIAGNOSIS:  LEFT PERIPROSTHETIC DISTAL FEMUR FRACTURE  PROCEDURE:  Procedure(s): OPEN REDUCTION INTERNAL FIXATION (ORIF) FEMUR FRACTURE (Left) WITH BONE GRAFTING  SURGEON:  Surgeon(s) and Role:    Altamese , MD - Primary  PHYSICIAN ASSISTANT: Ainsley Spinner, PA-C  ANESTHESIA:   general  EBL:  Total I/O In: 1500 [I.V.:1000; IV Piggyback:500] Out: 1050 [Urine:650; Blood:400]  BLOOD ADMINISTERED:none  DRAINS: none   LOCAL MEDICATIONS USED:  NONE  SPECIMEN:  No Specimen  DISPOSITION OF SPECIMEN:  N/A  COUNTS:  YES  TOURNIQUET:    DICTATION: .Other Dictation: Dictation Number [id number]  PLAN OF CARE: Admit to inpatient   PATIENT DISPOSITION:  PACU - hemodynamically stable.   Delay start of Pharmacological VTE agent (>24hrs) due to surgical blood loss or risk of bleeding: no

## 2017-03-24 NOTE — Progress Notes (Signed)
Patient arrived to 6n26, alert and oriented slightly drowsy, pain moderate- will medicate per orders. Family present upon admission. VSS, IV fluids infusing, O2 on 2L, placed on continuous pulse ox, foley cath in place. Left leg from thigh down to foot in ace wrap and left hip incision covered with hydrocolloid foam with slight shadowing present. Will continue to monitor.

## 2017-03-24 NOTE — Anesthesia Procedure Notes (Signed)
Procedure Name: Intubation Date/Time: 03/24/2017 11:14 AM Performed by: Geraldo Docker Pre-anesthesia Checklist: Patient identified, Patient being monitored, Timeout performed, Emergency Drugs available and Suction available Patient Re-evaluated:Patient Re-evaluated prior to induction Oxygen Delivery Method: Circle System Utilized Preoxygenation: Pre-oxygenation with 100% oxygen Induction Type: IV induction Ventilation: Mask ventilation without difficulty Laryngoscope Size: Miller and 3 Grade View: Grade I Tube type: Oral Tube size: 7.5 mm Number of attempts: 1 Airway Equipment and Method: Stylet Placement Confirmation: ETT inserted through vocal cords under direct vision,  positive ETCO2 and breath sounds checked- equal and bilateral Secured at: 22 cm Tube secured with: Tape Dental Injury: Teeth and Oropharynx as per pre-operative assessment

## 2017-03-24 NOTE — Progress Notes (Signed)
[facility]  Pt to be transferred to [facility] OR preop holding for surgery today Spoke with ED nurse. [facility] contacted at 0810 I placed transfer order in computer and reconciled transfer orders  NPO Bedrest Pt has active type and screen Other labs look ok  U/A questionable, pt on rocephin. Urine culture ordered, unclear if it has been completed   Mearl Latin, PA-C [facility] [phone] (C) [phone] (O) 03/24/2017 8:44 AM

## 2017-03-24 NOTE — Transfer of Care (Signed)
Immediate Anesthesia Transfer of Care Note  Patient: Crystal [name]  Procedure(s) Performed: Procedure(s): OPEN REDUCTION INTERNAL FIXATION (ORIF) FEMUR FRACTURE (Left)  Patient Location: PACU  Anesthesia Type:General  Level of Consciousness: drowsy and patient cooperative  Airway & Oxygen Therapy: Patient Spontanous Breathing and Patient connected to face mask oxygen  Post-op Assessment: Report given to RN and Post -op Vital signs reviewed and stable  Post vital signs: Reviewed and stable  Last Vitals:  Vitals:   03/24/17 0800 03/24/17 1511  BP: 122/78   Pulse: 84   Resp: 15   Temp:  (P) 37.2 C  SpO2: 97%     Last Pain:  Vitals:   03/24/17 0742  TempSrc:   PainSc: 2          Complications: No apparent anesthesia complications

## 2017-03-24 NOTE — Consult Note (Signed)
[facility]opaedic [facility]uma [facility] Consultation  Reason for Consult: left periprosthetic fracture Referring Physician: Fuller Plan, MD  Crystal [name] is an 66 y.o. female.  HPI: [place] at home; does not use any assistive devices regular but "has a cane." Denies numbness or tingling. Three story home without bathroom or bedroom on the first. Patient anticipates needs for Rehab or SNF post op.  Past Medical History:  Diagnosis Date  . Arthritis   . ASCVD (arteriosclerotic cardiovascular disease)   . Depression   . Fibromyalgia   . Gait disorder 10/26/2012  . GERD (gastroesophageal reflux disease)   . GI bleed   . Hyperlipidemia   . Hypertension   . Hypothyroid   . Restless leg syndrome   . Rhinitis 06/25/2010  . Sleep apnea   . TIA (transient ischemic attack)    3        . Vitamin D deficiency     Past Surgical History:  Procedure Laterality Date  . ABDOMINAL HYSTERECTOMY  1992  . ANTERIOR CERVICAL DECOMPRESSION/DISCECTOMY FUSION 4 LEVELS N/A 06/14/2015   Procedure: Cervical three-four Cervical four-five Cervical five- six Cervical six- seven  Anterior cervical decompression/diskectomy/fusion;  Surgeon: Erline Levine, MD;  Location: [facility];  Service: Neurosurgery;  Laterality: N/A;  C3-4 C4-5 C5-6 C6-7 Anterior cervical decompression/diskectomy/fusion  . APPENDECTOMY  1960  . [place]  . JOINT REPLACEMENT     2   LEFT  1 RIGHT   . Knee susrgery    . ROTATOR CUFF REPAIR Left 2005   2 LEFT  1  RIGHT  . SHOULDER ADHESION RELEASE    . SPINE SURGERY    . TOE SURGERY     LEFT       . TOE SURGERY     RIGHT  BIG TOE  . TONSILLECTOMY     T+A  . TONSILLECTOMY  1960    Family History  Problem Relation Age of Onset  . Leukemia Sister   . Lupus Sister   . Cancer Sister        uterus  . Depression Sister   . Heart disease Father   . Hypertension Father   . Hyperlipidemia Father   . Cancer Mother   . Diabetes Mother   . Osteoporosis Mother   .  Leukemia Mother   . Breast cancer Mother     Social History:  reports that she has never smoked. She has never used smokeless tobacco. She reports that she drinks alcohol. She reports that she does not use drugs.  Allergies:  Allergies  Allergen Reactions  . Atorvastatin Other (See Comments)    Extreme pain  . Codeine Other (See Comments)    Headache  . Pravastatin Other (See Comments)    Exreme pain  . Statins Other (See Comments)    Extreme pain  . Amitriptyline Other (See Comments)    Unspecified  . Fish Oil Nausea Only  . Nuvigil [Armodafinil] Other (See Comments)    Unspecified  . Erythromycin Other (See Comments)    Reaction unspecified  . Erythromycin Base Rash  . Flax Seed [Bio-Flax] Rash    Linseed  . Hydrocodone-Acetaminophen Rash  . Morphine Nausea And Vomiting and Rash  . Nsaids Nausea Only  . Sertraline Rash  . Sinemet [Carbidopa W-Levodopa] Rash  . Sulfamethoxazole Rash    Medications:  Prior to Admission:  Prescriptions Prior to Admission  Medication Sig Dispense Refill Last Dose  . acyclovir (ZOVIRAX) 800 MG tablet TAKE  ONE TABLET BY MOUTH THREE TIMES DAILY WITH MEALS AND 2 NIGHTLY AT BEDTIME. 50 tablet PRN more than 30days  . aspirin EC 81 MG tablet Take 81 mg by mouth daily.   03/23/2017 at Unknown time  . benzonatate (TESSALON) 200 MG capsule Take 1 perle 3 x / day to prevent cough 30 capsule 1 03/23/2017 at Unknown time  . bisoprolol-hydrochlorothiazide (ZIAC) 10-6.25 MG tablet TAKE ONE TABLET BY MOUTH ONE TIME DAILY FOR BLOOD PRESSURE FILL 07/05/2015 90 tablet 1 03/22/2017 at 1700  . buPROPion (WELLBUTRIN XL) 300 MG 24 hr tablet TAKE 1 TABLET BY MOUTH EVERY MORNING FOR MOOD AND FOCUS ATTENTION 90 tablet 1 03/23/2017 at Unknown time  . cyclobenzaprine (FLEXERIL) 5 MG tablet TAKE 1 TABLET BY MOUTH 2 TIMES DAILY AS NEEDED FOR MUSCLE SPASMS. 90 tablet 1 unknown  . diclofenac sodium (VOLTAREN) 1 % GEL Apply 2 g topically 2 (two) times daily.  2 unknown  .  DULoxetine (CYMBALTA) 60 MG capsule TAKE 1 CAPSULE BY MOUTH 2 TIMES A DAY FOR PAIN. (Patient taking differently: TAKE 1 CAPSULE BY MOUTH DAILY FOR PAIN.) 180 capsule 1 03/22/2017 at Unknown time  . fenofibrate micronized (LOFIBRA) 134 MG capsule TAKE 1 CAPSULE BY MOUTH DAILY BEFORE BREAKFAST 90 capsule 3 03/23/2017 at Unknown time  . fluticasone (FLONASE) 50 MCG/ACT nasal spray USE TWO SPRAYS IN EACH NOSTRIL DAILY AS NEEDED 48 g 3 Past Week at Unknown time  . gabapentin (NEURONTIN) 300 MG capsule TAKE ONE CAPSULE 3 TIMES A DAY 270 capsule 1 03/23/2017 at Unknown time  . IRON PO Take 25 mg by mouth daily.    03/23/2017 at Unknown time  . levothyroxine (SYNTHROID, LEVOTHROID) 100 MCG tablet Take 100 mcg by mouth daily before breakfast.   03/23/2017 at Unknown time  . Linaclotide (LINZESS) 290 MCG CAPS capsule Take 290 mcg by mouth daily.   03/23/2017 at Unknown time  . meclizine (ANTIVERT) 25 MG tablet Take 1 tablet (25 mg total) by mouth 3 (three) times daily as needed for dizziness. 60 tablet 0 03/22/2017 at Unknown time  . OVER THE COUNTER MEDICATION Take 1 capsule by mouth daily.    03/23/2017 at Unknown time  . OVER THE COUNTER MEDICATION Take 1 capsule by mouth daily.    03/23/2017 at Unknown time  . oxybutynin (DITROPAN) 5 MG tablet TAKE 1 TABLET BY MOUTH 2 TIMES DAILY 180 tablet 1 03/23/2017 at Unknown time  . phentermine (ADIPEX-P) 37.5 MG tablet TAKE 1 TABLET BY MOUTH EVERY DAY BEFORE BREAKFAST (Patient taking differently: Take 37.5 mg by mouth every other day. ) 30 tablet 2 03/23/2017 at Unknown time  . polyethylene glycol (MIRALAX / GLYCOLAX) packet Take 17 g by mouth daily as needed for mild constipation. 14 each 0 03/22/2017 at Unknown time  . predniSONE (DELTASONE) 20 MG tablet 1 tab 3 x day for 2 days, then 1 tab 2 x day for 2 days, then 1 tab 1 x day for 3 days 13 tablet 0 03/23/2017 at Unknown time  . promethazine-dextromethorphan (PROMETHAZINE-DM) 6.25-15 MG/5ML syrup Take 1 to 2 tsp enery 4 hours  if needed for cough (Patient taking differently: Take 1 to 2 tsp every 4 hours if needed for cough) 360 mL 1 03/22/2017 at Unknown time  . ranitidine (ZANTAC) 300 MG tablet Take 1 tablet (300 mg total) by mouth at bedtime. 90 tablet 0 03/22/2017 at Unknown time  . rOPINIRole (REQUIP) 2 MG tablet TAKE 1 TABLET 4 TIMES A DAY FOR RESTLESS LEG  SYNDROME (Patient taking differently: TAKE 1 TABLET 3 TIMES A DAY FOR RESTLESS LEG SYNDROME) 360 tablet 1 03/23/2017 at Unknown time  . traMADol (ULTRAM) 50 MG tablet Take 50 mg by mouth every 8 (eight) hours as needed for pain.  0 03/23/2017 at Unknown time  . valsartan (DIOVAN) 80 MG tablet 1/2-1 tablet dailyt for BP, stop losartan (Patient taking differently: Take 80 mg by mouth daily. ) 30 tablet 11 03/23/2017 at Unknown time  . Vitamin D, Ergocalciferol, (DRISDOL) 50000 units CAPS capsule Take 1 capsule (50,000 Units total) by mouth daily. (Patient taking differently: Take 50,000 Units by mouth daily. Take on tuesday, Wednesday,thursday) 90 capsule 1 03/19/2017  . levofloxacin (LEVAQUIN) 500 MG tablet TAKE 1 TABLET EVERY DAY (Patient not taking: Reported on 03/23/2017) 10 tablet 0 Completed Course at Unknown time  . neomycin-polymyxin-hydrocortisone (CORTISPORIN) 3.5-10000-1 otic suspension Place 4 drops into both ears 4 (four) times daily. X 7 days (Patient not taking: Reported on 03/23/2017) 10 mL 1 Completed Course at Unknown time    Results for orders placed or performed during the hospital encounter of 03/23/17 (from the past 48 hour(s))  Type and screen [place]     Status: None   Collection Time: 03/23/17  5:02 PM  Result Value Ref Range   ABO/RH(D) A NEG    Antibody Screen NEG    Sample Expiration 03/26/2017   ABO/Rh     Status: None   Collection Time: 03/23/17  5:02 PM  Result Value Ref Range   ABO/RH(D) A NEG   Basic metabolic panel     Status: Abnormal   Collection Time: 03/23/17  5:06 PM  Result Value Ref Range   Sodium 143  135 - 145 mmol/L   Potassium 3.0 (L) 3.5 - 5.1 mmol/L   Chloride 104 101 - 111 mmol/L   CO2 29 22 - 32 mmol/L   Glucose, Bld 92 65 - 99 mg/dL   BUN 24 (H) 6 - 20 mg/dL   Creatinine, Ser 0.86 0.44 - 1.00 mg/dL   Calcium 9.2 8.9 - 10.3 mg/dL   GFR calc non Af Amer >60 >60 mL/min   GFR calc Af Amer >60 >60 mL/min    Comment: (NOTE) The eGFR has been calculated using the CKD EPI equation. This calculation has not been validated in all clinical situations. eGFR's persistently <60 mL/min signify possible Chronic Kidney Disease.    Anion gap 10 5 - 15  CBC WITH DIFFERENTIAL     Status: Abnormal   Collection Time: 03/23/17  5:06 PM  Result Value Ref Range   WBC 13.1 (H) 4.0 - 10.5 K/uL   RBC 3.91 3.87 - 5.11 MIL/uL   Hemoglobin 11.5 (L) 12.0 - 15.0 g/dL   HCT 34.3 (L) 36.0 - 46.0 %   MCV 87.7 78.0 - 100.0 fL   MCH 29.4 26.0 - 34.0 pg   MCHC 33.5 30.0 - 36.0 g/dL   RDW 14.2 11.5 - 15.5 %   Platelets 295 150 - 400 K/uL   Neutrophils Relative % 72 %   Neutro Abs 9.5 (H) 1.7 - 7.7 K/uL   Lymphocytes Relative 19 %   Lymphs Abs 2.5 0.7 - 4.0 K/uL   Monocytes Relative 8 %   Monocytes Absolute 1.0 0.1 - 1.0 K/uL   Eosinophils Relative 1 %   Eosinophils Absolute 0.1 0.0 - 0.7 K/uL   Basophils Relative 0 %   Basophils Absolute 0.0 0.0 - 0.1 K/uL  Protime-INR  Status: None   Collection Time: 03/23/17  5:06 PM  Result Value Ref Range   Prothrombin Time 14.0 11.4 - 15.2 seconds   INR 1.09   Urinalysis, Routine w reflex microscopic     Status: Abnormal   Collection Time: 03/23/17  5:06 PM  Result Value Ref Range   Color, Urine YELLOW YELLOW   APPearance HAZY (A) CLEAR   Specific Gravity, Urine 1.028 1.005 - 1.030   pH 5.0 5.0 - 8.0   Glucose, UA NEGATIVE NEGATIVE mg/dL   Hgb urine dipstick NEGATIVE NEGATIVE   Bilirubin Urine NEGATIVE NEGATIVE   Ketones, ur NEGATIVE NEGATIVE mg/dL   Protein, ur NEGATIVE NEGATIVE mg/dL   Nitrite NEGATIVE NEGATIVE   Leukocytes, UA MODERATE (A)  NEGATIVE   RBC / HPF 0-5 0 - 5 RBC/hpf   WBC, UA 6-30 0 - 5 WBC/hpf   Bacteria, UA FEW (A) NONE SEEN   Squamous Epithelial / LPF 0-5 (A) NONE SEEN   Mucus PRESENT    Ca Oxalate Crys, UA PRESENT   CBC with Differential/Platelet     Status: Abnormal   Collection Time: 03/24/17  5:01 AM  Result Value Ref Range   WBC 9.0 4.0 - 10.5 K/uL   RBC 3.85 (L) 3.87 - 5.11 MIL/uL   Hemoglobin 11.1 (L) 12.0 - 15.0 g/dL   HCT 34.4 (L) 36.0 - 46.0 %   MCV 89.4 78.0 - 100.0 fL   MCH 28.8 26.0 - 34.0 pg   MCHC 32.3 30.0 - 36.0 g/dL   RDW 14.5 11.5 - 15.5 %   Platelets 269 150 - 400 K/uL   Neutrophils Relative % 64 %   Neutro Abs 5.7 1.7 - 7.7 K/uL   Lymphocytes Relative 26 %   Lymphs Abs 2.3 0.7 - 4.0 K/uL   Monocytes Relative 8 %   Monocytes Absolute 0.8 0.1 - 1.0 K/uL   Eosinophils Relative 2 %   Eosinophils Absolute 0.2 0.0 - 0.7 K/uL   Basophils Relative 0 %   Basophils Absolute 0.0 0.0 - 0.1 K/uL  Protime-INR     Status: None   Collection Time: 03/24/17  5:01 AM  Result Value Ref Range   Prothrombin Time 14.2 11.4 - 15.2 seconds   INR 8.11   Basic metabolic panel     Status: Abnormal   Collection Time: 03/24/17  5:01 AM  Result Value Ref Range   Sodium 145 135 - 145 mmol/L   Potassium 3.8 3.5 - 5.1 mmol/L    Comment: DELTA CHECK NOTED NO VISIBLE HEMOLYSIS    Chloride 106 101 - 111 mmol/L   CO2 31 22 - 32 mmol/L   Glucose, Bld 126 (H) 65 - 99 mg/dL   BUN 26 (H) 6 - 20 mg/dL   Creatinine, Ser 0.85 0.44 - 1.00 mg/dL   Calcium 9.0 8.9 - 10.3 mg/dL   GFR calc non Af Amer >60 >60 mL/min   GFR calc Af Amer >60 >60 mL/min    Comment: (NOTE) The eGFR has been calculated using the CKD EPI equation. This calculation has not been validated in all clinical situations. eGFR's persistently <60 mL/min signify possible Chronic Kidney Disease.    Anion gap 8 5 - 15  APTT     Status: None   Collection Time: 03/24/17  5:01 AM  Result Value Ref Range   aPTT 34 24 - 36 seconds    Dg Chest  2 View  Result Date: 03/23/2017 CLINICAL DATA:  Femur fracture EXAM: CHEST  2 VIEW COMPARISON:  None. FINDINGS: Stable asymmetric elevation right hemidiaphragm. The lungs are clear without focal pneumonia, edema, pneumothorax or pleural effusion. The cardiopericardial silhouette is within normal limits for size. The visualized bony structures of the thorax are intact. Incomplete visualization lower cervical fusion. Telemetry leads overlie the chest. IMPRESSION: No active cardiopulmonary disease. Electronically Signed   By: Misty Stanley M.D.   On: 03/23/2017 17:40   Dg Knee 1-2 Views Left  Result Date: 03/23/2017 CLINICAL DATA:  Fall, left leg pain. EXAM: LEFT KNEE - 1-2 VIEW COMPARISON:  None. FINDINGS: AP and lateral views of the left knee are provided. There is a displaced fracture within the distal left femur, centered at the distal femoral metadiaphysis, with approximately 2 cm diastasis at the upper margin of the fracture. The femoral intramedullary stem component of the left knee arthroplasty appears to extend through the fracture site, but appears otherwise intact and appropriately positioned. IMPRESSION: 1. Displaced fracture within the distal left femur, acute appearing, centered at the distal metadiaphysis, with approximately 2 cm diastasis at the upper margin of the fracture. 2. Femoral intramedullary stem component of the left knee arthroplasty hardware appears to extend into the fracture site, but appears otherwise intact and appropriately positioned. Inferior portion of the arthroplasty hardware is incompletely imaged but appears intact and appropriately positioned. Electronically Signed   By: Franki Cabot M.D.   On: 03/23/2017 17:47   Dg Hip Unilat With Pelvis 2-3 Views Left  Result Date: 03/23/2017 CLINICAL DATA:  Fall, left leg pain EXAM: DG HIP (WITH OR WITHOUT PELVIS) 2-3V LEFT COMPARISON:  None. FINDINGS: Single-view of the pelvis and two views of the left hip are provided. Osseous  alignment is normal. No fracture line or displaced fracture fragment seen. Fusion hardware within the lower lumbar spine is incompletely imaged. Soft tissues about the left hip are unremarkable. IMPRESSION: No acute findings at the left hip. Electronically Signed   By: Franki Cabot M.D.   On: 03/23/2017 17:44   Dg Femur 1v Left  Result Date: 03/23/2017 CLINICAL DATA:  Fall, leg pain. EXAM: LEFT FEMUR 1 VIEW COMPARISON:  None. FINDINGS: Left knee arthroplasty hardware in place, incompletely imaged, femoral intramedullary stem component appearing intact and appropriately positioned. There is a displaced fracture of the distal left femur, of uncertain age, presumably acute based on accompanying plain film of the left knee. Upper femur appears intact and normally aligned. Soft tissues about the left femur are unremarkable. IMPRESSION: 1. Displaced fracture of the distal left femur, acute appearance based on accompanying plain film of the left knee. 2. Femoral component of the left knee arthroplasty hardware appears intact and appropriately positioned on this exam, extends to the fracture line on accompanying plain film of the knee. Electronically Signed   By: Franki Cabot M.D.   On: 03/23/2017 17:44    ROS as above, extensive Blood pressure 122/78, pulse 84, temperature 98.4 F (36.9 C), temperature source Oral, resp. rate 15, height 5' 4"  (1.626 m), weight 76.2 kg (168 lb), SpO2 97 %. Physical Exam  NCAT, talkative LLE Braced  Edema/ swelling controlled  Sens: DPN, SPN, TN intact  Motor: EHL, FHL, and lessor toe ext and flex all intact grossly  Brisk cap refill, warm to touch  Midline, slightly medial incision  Assessment/Plan: Left periprosthetic femoral shaft and supracondylar fracture with stable prosthesis  I discussed with the patient the risks and benefits of surgery, including the possibility of infection, nerve injury, vessel injury, wound breakdown, arthritis,  symptomatic hardware, DVT/  PE, loss of motion, malunion, nonunion, and need for further surgery among others.  She acknowledged these risks and wished to proceed.  Altamese Sudan, MD [facility] [phone] (p)  03/24/2017  11:27 AM

## 2017-03-25 ENCOUNTER — Other Ambulatory Visit: Payer: Self-pay | Admitting: Internal Medicine

## 2017-03-25 ENCOUNTER — Encounter (HOSPITAL_COMMUNITY): Payer: Self-pay | Admitting: Orthopedic Surgery

## 2017-03-25 DIAGNOSIS — I10 Essential (primary) hypertension: Secondary | ICD-10-CM

## 2017-03-25 DIAGNOSIS — E876 Hypokalemia: Secondary | ICD-10-CM

## 2017-03-25 DIAGNOSIS — E039 Hypothyroidism, unspecified: Secondary | ICD-10-CM

## 2017-03-25 DIAGNOSIS — S72402A Unspecified fracture of lower end of left femur, initial encounter for closed fracture: Secondary | ICD-10-CM

## 2017-03-25 LAB — CBC
HCT: 25.2 % — ABNORMAL LOW (ref 36.0–46.0)
HCT: 27.8 % — ABNORMAL LOW (ref 36.0–46.0)
Hemoglobin: 8 g/dL — ABNORMAL LOW (ref 12.0–15.0)
Hemoglobin: 8.6 g/dL — ABNORMAL LOW (ref 12.0–15.0)
MCH: 28.4 pg (ref 26.0–34.0)
MCH: 27.9 pg (ref 26.0–34.0)
MCHC: 31.7 g/dL (ref 30.0–36.0)
MCHC: 30.9 g/dL (ref 30.0–36.0)
MCV: 89.4 fL (ref 78.0–100.0)
MCV: 90.3 fL (ref 78.0–100.0)
Platelets: 225 10*3/uL (ref 150–400)
Platelets: 254 10*3/uL (ref 150–400)
RBC: 2.82 MIL/uL — ABNORMAL LOW (ref 3.87–5.11)
RBC: 3.08 MIL/uL — ABNORMAL LOW (ref 3.87–5.11)
RDW: 14.4 % (ref 11.5–15.5)
RDW: 14.5 % (ref 11.5–15.5)
WBC: 10.3 10*3/uL (ref 4.0–10.5)
WBC: 11.7 10*3/uL — ABNORMAL HIGH (ref 4.0–10.5)

## 2017-03-25 LAB — BASIC METABOLIC PANEL
Anion gap: 6 (ref 5–15)
BUN: 15 mg/dL (ref 6–20)
CO2: 31 mmol/L (ref 22–32)
Calcium: 7.9 mg/dL — ABNORMAL LOW (ref 8.9–10.3)
Chloride: 104 mmol/L (ref 101–111)
Creatinine, Ser: 0.74 mg/dL (ref 0.44–1.00)
GFR calc Af Amer: >60 mL/min (ref >60)
GFR calc non Af Amer: >60 mL/min (ref >60)
Glucose, Bld: 116 mg/dL — ABNORMAL HIGH (ref 65–99)
Potassium: 3.7 mmol/L (ref 3.5–5.1)
Sodium: 141 mmol/L (ref 135–145)

## 2017-03-25 LAB — PROTIME-INR
INR: 1.17
Prothrombin Time: 14.9 seconds (ref 11.4–15.2)

## 2017-03-25 MED ORDER — OXYCODONE-ACETAMINOPHEN 5-325 MG PO TABS
1.0000 | ORAL_TABLET | Freq: Four times a day (QID) | ORAL | Status: DC | PRN
  Administered 2017-03-25 – 2017-03-26 (×5): 1 via ORAL
  Filled 2017-03-25 (×5): qty 1

## 2017-03-25 NOTE — Anesthesia Postprocedure Evaluation (Signed)
Anesthesia Post Note  Patient: Crystal [name]  Procedure(s) Performed: Procedure(s) (LRB): OPEN REDUCTION INTERNAL FIXATION (ORIF) FEMUR FRACTURE (Left)     Patient location during evaluation: PACU Anesthesia Type: General Level of consciousness: awake and sedated Pain management: pain level controlled Vital Signs Assessment: post-procedure vital signs reviewed and stable Respiratory status: spontaneous breathing, nonlabored ventilation, respiratory function stable and patient connected to nasal cannula oxygen Cardiovascular status: blood pressure returned to baseline and stable Postop Assessment: no apparent nausea or vomiting Anesthetic complications: no    Last Vitals:  Vitals:   03/25/17 0141 03/25/17 0510  BP: (!) 102/52 114/64  Pulse: 81 83  Resp: 16 16  Temp: 36.6 C 36.6 C  SpO2: 94% 96%    Last Pain:  Vitals:   03/25/17 0917  TempSrc:   PainSc: 7                  Kaitlynne Wenz,JAMES TERRILL

## 2017-03-25 NOTE — Evaluation (Signed)
Physical Therapy Evaluation Patient Details Name: Crystal [name] MRN: [id number] DOB: [birth year] Today's Date: 03/25/2017   History of Present Illness  Pt admitted after falling, resulting in L periprosthetic femur fx. Underwent ORIF with bone grafting 03/24/17. PMH; OSA, restless leg syndrome, depression, fibromyalgia, HTN, chrohns disease. Pt with multiple orthopedic surgeries including her back and B shoulders, currently in OPPT for R knee cyst.   Clinical Impression  Patient presents with decreased independence with mobility due to pain and limited weight bearing as well as decreased ROM and strength.  Feel STSNF level rehab appropriate for continued skilled PT at d/c.  Will follow acutely as well to address deficits and progress as tolerated.    Follow Up Recommendations SNF;Supervision/Assistance - 24 hour    Equipment Recommendations  Other (comment) (TBA at next venue)    Recommendations for Other Services       Precautions / Restrictions Precautions Precautions: Fall Restrictions Weight Bearing Restrictions: Yes LLE Weight Bearing: Non weight bearing      Mobility  Bed Mobility Overal bed mobility: Needs Assistance Bed Mobility: Supine to Sit     Supine to sit: +2 for physical assistance;Mod assist     General bed mobility comments: attempted log roll technique initially, but unable to tolerate, performed helicopter technique with less pain, assist to support L LE and raise trunk, pt able to advance hips to EOB while L LE was supported  Transfers Overall transfer level: Needs assistance Equipment used: Rolling walker (2 wheeled) Transfers: Sit to/from [employer] Transfers Sit to Stand: +2 physical assistance;Min assist Stand pivot transfers: +2 physical assistance;Min assist       General transfer comment: assist to steady and to assure pt was adhering to NWB precaution on L LE, cues for technique   Ambulation/Gait                Stairs             Wheelchair Mobility    Modified Rankin (Stroke Patients Only)       Balance Overall balance assessment: Needs assistance   Sitting balance-Leahy Scale: Good       Standing balance-Leahy Scale: Poor Standing balance comment: B UE support required in static standing due to NWB                             Pertinent Vitals/Pain Pain Score: 8  Pain Location: lower back and L hip/knee Pain Descriptors / Indicators: Aching;Sharp Pain Intervention(s): Monitored during session;Repositioned;Ice applied    Home Living Family/patient expects to be discharged to:: Skilled nursing facility Living Arrangements: Spouse/significant other Available Help at Discharge: Family;Available PRN/intermittently Type of Home: House Home Access: Stairs to enter Entrance Stairs-Rails: None   Home Layout: Multi-level;Bed/bath upstairs Home Equipment: Environmental consultant - 2 wheels;Cane - single point;Grab bars - tub/shower;Wheelchair - power Additional Comments: Thinks she has a BSC but not sure, has grandfathers power chair, but it is old and is separated    Prior Function Level of Independence: Independent         Comments: was having PT on knees due to cyst on R and twisted L falling on last step at her daughters while holding grandson     Hand Dominance   Dominant Hand: Left    Extremity/Trunk Assessment   Upper Extremity Assessment Upper Extremity Assessment: Defer to OT evaluation    Lower Extremity Assessment Lower Extremity Assessment: LLE deficits/detail LLE Deficits / Details: AAROM  knee flexion very minimal (approx 10 degrees) due to pain, able to perform quad set, ankle AROM WFL LLE Sensation: history of peripheral neuropathy    Cervical / Trunk Assessment Cervical / Trunk Assessment: Other exceptions Cervical / Trunk Exceptions: h/o chronic back pain and surgery  Communication   Communication: No difficulties  Cognition Arousal/Alertness:  Awake/alert Behavior During Therapy: WFL for tasks assessed/performed Overall Cognitive Status: Within Functional Limits for tasks assessed                                        General Comments      Exercises Total Joint Exercises Ankle Circles/Pumps: AROM;Both;5 reps;Supine Quad Sets: AROM;Both;5 reps;Supine Heel Slides: AAROM;Left;Other (comment) (about 3 very limited due to pain)   Assessment/Plan    PT Assessment Patient needs continued PT services  PT Problem List Decreased strength;Decreased activity tolerance;Decreased balance;Decreased knowledge of use of DME;Decreased range of motion;Decreased mobility;Pain       PT Treatment Interventions DME instruction;Gait training;Functional mobility training;Balance training;Patient/family education;Therapeutic exercise;Therapeutic activities    PT Goals (Current goals can be found in the Care Plan section)  Acute Rehab PT Goals Patient Stated Goal: to return to PLOF PT Goal Formulation: With patient Time For Goal Achievement: 04/03/17 Potential to Achieve Goals: Good    Frequency Min 3X/week   Barriers to discharge        Co-evaluation PT/OT/SLP Co-Evaluation/Treatment: Yes Reason for Co-Treatment: For patient/therapist safety PT goals addressed during session: Mobility/safety with mobility;Balance         AM-PAC PT "6 Clicks" Daily Activity  Outcome Measure Difficulty turning over in bed (including adjusting bedclothes, sheets and blankets)?: Unable Difficulty moving from lying on back to sitting on the side of the bed? : Unable Difficulty sitting down on and standing up from a chair with arms (e.g., wheelchair, bedside commode, etc,.)?: Unable Help needed moving to and from a bed to chair (including a wheelchair)?: A Lot Help needed walking in hospital room?: A Lot Help needed climbing 3-5 steps with a railing? : Total 6 Click Score: 8    End of Session Equipment Utilized During Treatment:  Gait belt Activity Tolerance: Patient limited by pain Patient left: in chair;with call bell/phone within reach;with chair alarm set   PT Visit Diagnosis: Other abnormalities of gait and mobility (R26.89);Pain;History of falling (Z91.81) Pain - Right/Left: Left Pain - part of body: Knee    Time: 7902-4097 PT Time Calculation (min) (ACUTE ONLY): 32 min   Charges:   PT Evaluation $PT Eval Moderate Complexity: 1 Mod     PT G CodesMagda Kiel, [place] [phone] 03/25/2017   Reginia Naas 03/25/2017, 2:16 PM

## 2017-03-25 NOTE — Progress Notes (Signed)
PROGRESS NOTE    Crystal [name]  EHU:[id number] DOB: [birth year] DOA: 03/23/2017 PCP: Unk Pinto, MD    Brief Narrative:  66 year old female with past medical history of hypertension, fibromyalgia, depression, OSA and Crohn's disease presented after a mechanical fall. She was admitted with left femur fracture and underwent surgical intervention on 03/24/2017 by orthopedics.  Assessment & Plan:   Principal Problem:   Periprosthetic supracondylar fracture of femur, left  Active Problems:   Obstructive sleep apnea   RESTLESS LEG SYNDROME   Essential hypertension   Hypothyroid   Depression, major, recurrent, in partial remission ([place])   Fall at home, initial encounter   Hypokalemia   1. Left femur fracture.  - Status post ORIF by orthopedics on 03/24/2017 - Activity as per orthopedics and PT/OT recommendations - Pain management - Fall precautions  2. Urinary tract infection.  - Cultures negative so far. Discontinue Rocephin after today's dose  3. Hypokalemia.  - Resolved.  4. Hypertension.  Continue with irbersartan. Monitor blood pressure  5. Hypothyroidism. Continue levothyroxine  6. Crohn's disease. Stable with no signs of exacerbation   7. Acute blood loss anemia: -Probably postop. Monitor  DVT prophylaxis: scd/Lovenox  Code Status: full Family Communication: None at bedside Disposition Plan: Nursing home in 1-2 days   Consultants:   Orthopedics  Procedures:   ORIF on 03/24/2017  Antimicrobials:    Rocephin from 03/23/2017   Subjective: Patient seen and examined at bedside. She complains of right hip pain but is slightly drowsy. No Overnight fever, nausea or vomiting   Objective: Vitals:   03/24/17 1731 03/24/17 2139 03/25/17 0141 03/25/17 0510  BP: 116/70 (!) 112/59 (!) 102/52 114/64  Pulse: 89 97 81 83  Resp: 15 16 16 16   Temp: (!) 97.4 F (36.3 C) 97.6 F (36.4 C) 97.8 F (36.6 C) 97.8 F (36.6 C)  TempSrc: Oral Oral Oral Oral    SpO2: 100% 97% 94% 96%  Weight:      Height:        Intake/Output Summary (Last 24 hours) at 03/25/17 1222 Last data filed at 03/25/17 0513  Gross per 24 hour  Intake           5012.5 ml  Output             1800 ml  Net           3212.5 ml   Filed Weights   03/23/17 1619  Weight: 76.2 kg (168 lb)    Examination:  General: Slightly drowsy but wakes up on calling her name. No acute distress  Cardiovascular:  S1-S2 present; rate controlled Pulmonary: Bilateral decreased breath sounds at bases. Gastrointestinal: Soft, nontender, bowel sounds positive  Musculoskeletal: No cyanosis, clubbing or edema     Data Reviewed: I have personally reviewed following labs and imaging studies  CBC:  Recent Labs Lab 03/23/17 1706 03/24/17 0501 03/24/17 1806 03/25/17 0408  WBC 13.1* 9.0 13.3* 10.3  NEUTROABS 9.5* 5.7  --   --   HGB 11.5* 11.1* 9.1* 8.0*  HCT 34.3* 34.4* 28.6* 25.2*  MCV 87.7 89.4 89.1 89.4  PLT 295 269 230 785   Basic Metabolic Panel:  Recent Labs Lab 03/23/17 1706 03/24/17 0501 03/24/17 1806 03/25/17 0408  NA 143 145  --  141  K 3.0* 3.8  --  3.7  CL 104 106  --  104  CO2 29 31  --  31  GLUCOSE 92 126*  --  116*  BUN 24* 26*  --  15  CREATININE 0.86 0.85 0.78 0.74  CALCIUM 9.2 9.0  --  7.9*   GFR: Estimated Creatinine Clearance: 67.3 mL/min (by C-G formula based on SCr of 0.74 mg/dL). Liver Function Tests: No results for input(s): AST, ALT, ALKPHOS, BILITOT, PROT, ALBUMIN in the last 168 hours. No results for input(s): LIPASE, AMYLASE in the last 168 hours. No results for input(s): AMMONIA in the last 168 hours. Coagulation Profile:  Recent Labs Lab 03/23/17 1706 03/24/17 0501 03/25/17 0408  INR 1.09 1.11 1.17   Cardiac Enzymes: No results for input(s): CKTOTAL, CKMB, CKMBINDEX, TROPONINI in the last 168 hours. BNP (last 3 results) No results for input(s): PROBNP in the last 8760 hours. HbA1C: No results for input(s): HGBA1C in the last  72 hours. CBG: No results for input(s): GLUCAP in the last 168 hours. Lipid Profile: No results for input(s): CHOL, HDL, LDLCALC, TRIG, CHOLHDL, LDLDIRECT in the last 72 hours. Thyroid Function Tests: No results for input(s): TSH, T4TOTAL, FREET4, T3FREE, THYROIDAB in the last 72 hours. Anemia Panel: No results for input(s): VITAMINB12, FOLATE, FERRITIN, TIBC, IRON, RETICCTPCT in the last 72 hours.    Radiology Studies: I have reviewed all of the imaging during this hospital visit personally     Scheduled Meds: . acidophilus  1 capsule Oral TID  . aspirin EC  81 mg Oral Daily  . buPROPion  300 mg Oral Daily  . DULoxetine  60 mg Oral BID  . enoxaparin (LOVENOX) injection  40 mg Subcutaneous Q24H  . famotidine  40 mg Oral QHS  . fenofibrate  160 mg Oral Daily  . gabapentin  300 mg Oral TID  . irbesartan  75 mg Oral Daily  . levothyroxine  100 mcg Oral QAC breakfast  . linaclotide  290 mcg Oral Daily  . oxybutynin  5 mg Oral BID  . predniSONE  20 mg Oral Q breakfast  . rOPINIRole  2 mg Oral TID   Continuous Infusions: . sodium chloride 75 mL/hr at 03/24/17 0154  . cefTRIAXone (ROCEPHIN)  IV Stopped (03/24/17 2131)  . methocarbamol (ROBAXIN)  IV       LOS: 2 days        Aline August, MD [facility] Pager [phone]

## 2017-03-25 NOTE — NC FL2 (Signed)
[place] MEDICAID FL2 LEVEL OF CARE SCREENING TOOL     IDENTIFICATION  Patient Name: Crystal [name] Birthdate: 01-03-1951 Sex: female Admission Date (Current Location): 03/23/2017  [facility] and [place] Number:  Producer, television/film/video and Address:  [facility] [place], [address] [place], [place] [address]      Provider Number: [id number]  Attending Physician Name and Address:  Glade Lloyd, MD  Relative Name and Phone Number:       Current Level of Care: Hospital Recommended Level of Care: Skilled Nursing Facility Prior Approval Number:    Date Approved/Denied:   PASRR Number:    Discharge Plan: SNF    Current Diagnoses: Patient Active Problem List   Diagnosis Date Noted  . Fall at home, initial encounter 03/24/2017  . Hypokalemia 03/24/2017  . Periprosthetic supracondylar fracture of femur, left  03/23/2017  . Insomnia 09/10/2016  . B12 deficiency anemia 06/16/2016  . Fatigue 03/26/2016  . Sleep talking 03/26/2016  . IBS (irritable colon syndrome) 08/09/2015  . Spinal stenosis in cervical region 06/14/2015  . Cervical pain 03/06/2015  . Medicare annual wellness visit, subsequent 01/31/2015  . Morbid obesity (BMI 31.88)  10/31/2014  . Medication management 10/25/2013  . Rotator cuff arthropathy 09/15/2013  . Hyperlipidemia   . T2_NIDDM   . Hypothyroid   . Depression, major, recurrent, in partial remission ([facility])   . Vitamin D deficiency   . ASCVD   . Gait disorder 10/26/2012  . Rhinitis 06/25/2010  . Obstructive sleep apnea 06/21/2010  . RESTLESS LEG SYNDROME 06/21/2010  . Essential hypertension 06/21/2010  . GERD 06/21/2010  . Crohn's Enterocolitis / Regional enteritis 06/21/2010  . Fibromyalgia 06/21/2010    Orientation RESPIRATION BLADDER Height & Weight     Self, Time, Situation, Place  O2 (2L [place]) Incontinent, Indwelling catheter Weight: 168 lb (76.2 kg) Height:  5\' 4"  (162.6 cm)  BEHAVIORAL SYMPTOMS/MOOD NEUROLOGICAL  BOWEL NUTRITION STATUS      Continent Diet (see DC summary)  AMBULATORY STATUS COMMUNICATION OF NEEDS Skin   Extensive Assist Verbally Surgical wounds (left leg wound- bandage with hyudrocolloid dressing changes)                       Personal Care Assistance Level of Assistance  Bathing, Dressing Bathing Assistance: Maximum assistance   Dressing Assistance: Maximum assistance     Functional Limitations Info             SPECIAL CARE FACTORS FREQUENCY  PT (By licensed PT), OT (By licensed OT)     PT Frequency: 5/wk OT Frequency: 5/wk            Contractures      Additional Factors Info  Code Status, Allergies, Psychotropic Code Status Info: FULL Allergies Info: Atorvastatin, Codeine, Pravastatin, Statins, Amitriptyline, Fish Oil, Nuvigil Armodafinil, Erythromycin, Erythromycin Base, Flax Seed Bio-flax, Hydrocodone-acetaminophen, Morphine, Nsaids, Sertraline, Sinemet Carbidopa W-levodopa, Sulfamethoxazole Psychotropic Info: wellbutin, cymbalta         Current Medications (03/25/2017):  This is the current hospital active medication list Current Facility-Administered Medications  Medication Dose Route Frequency Provider Last Rate Last Dose  . acetaminophen (TYLENOL) tablet 650 mg  650 mg Oral Q6H PRN Montez Morita, PA-C       Or  . acetaminophen (TYLENOL) suppository 650 mg  650 mg Rectal Q6H PRN Montez Morita, PA-C      . acidophilus (RISAQUAD) capsule 1 capsule  1 capsule Oral TID Clydie Braun, MD  1 capsule at 03/25/17 1108  . aspirin EC tablet 81 mg  81 mg Oral Daily Smith, Rondell A, MD   81 mg at 03/25/17 1108  . benzonatate (TESSALON) capsule 200 mg  200 mg Oral TID PRN Madelyn Flavors A, MD      . buPROPion (WELLBUTRIN XL) 24 hr tablet 300 mg  300 mg Oral Daily Smith, Rondell A, MD   300 mg at 03/25/17 1108  . cefTRIAXone (ROCEPHIN) 1 g in dextrose 5 % 50 mL IVPB  1 g Intravenous Q24H Glade Lloyd, MD   Stopped at 03/24/17 2131  . DULoxetine (CYMBALTA)  DR capsule 60 mg  60 mg Oral BID Madelyn Flavors A, MD   60 mg at 03/24/17 2102  . enoxaparin (LOVENOX) injection 40 mg  40 mg Subcutaneous Q24H Montez Morita, PA-C   40 mg at 03/25/17 7829  . famotidine (PEPCID) tablet 40 mg  40 mg Oral QHS Madelyn Flavors A, MD   40 mg at 03/24/17 2101  . fenofibrate tablet 160 mg  160 mg Oral Daily Madelyn Flavors A, MD   160 mg at 03/25/17 1109  . fentaNYL (SUBLIMAZE) injection 50 mcg  50 mcg Intravenous Q2H PRN Clydie Braun, MD   50 mcg at 03/24/17 2107  . fentaNYL (SUBLIMAZE) injection 50 mcg  50 mcg Intravenous Q30 min PRN Smith, Rondell A, MD      . fluticasone (FLONASE) 50 MCG/ACT nasal spray 2 spray  2 spray Each Nare Daily PRN Smith, Rondell A, MD      . gabapentin (NEURONTIN) capsule 300 mg  300 mg Oral TID Madelyn Flavors A, MD   300 mg at 03/25/17 1109  . irbesartan (AVAPRO) tablet 75 mg  75 mg Oral Daily Katrinka Blazing, Rondell A, MD   75 mg at 03/25/17 1109  . levothyroxine (SYNTHROID, LEVOTHROID) tablet 100 mcg  100 mcg Oral QAC breakfast Madelyn Flavors A, MD   100 mcg at 03/25/17 [id number]  . linaclotide (LINZESS) capsule 290 mcg  290 mcg Oral Daily Madelyn Flavors A, MD   290 mcg at 03/25/17 1109  . meclizine (ANTIVERT) tablet 25 mg  25 mg Oral TID PRN Madelyn Flavors A, MD      . menthol-cetylpyridinium (CEPACOL) lozenge 3 mg  1 lozenge Oral PRN Montez Morita, PA-C       Or  . phenol (CHLORASEPTIC) mouth spray 1 spray  1 spray Mouth/Throat PRN Montez Morita, PA-C      . methocarbamol (ROBAXIN) tablet 500 mg  500 mg Oral Q6H PRN Madelyn Flavors A, MD   500 mg at 03/25/17 3086   Or  . methocarbamol (ROBAXIN) 500 mg in dextrose 5 % 50 mL IVPB  500 mg Intravenous Q6H PRN Madelyn Flavors A, MD      . metoCLOPramide (REGLAN) tablet 5-10 mg  5-10 mg Oral Q8H PRN Montez Morita, PA-C       Or  . metoCLOPramide (REGLAN) injection 5-10 mg  5-10 mg Intravenous Q8H PRN Montez Morita, PA-C      . ondansetron [facility]) tablet 4 mg  4 mg Oral Q6H PRN Montez Morita, PA-C       Or  .  ondansetron [facility] injection 4 mg  4 mg Intravenous Q6H PRN Montez Morita, PA-C      . oxybutynin (DITROPAN) tablet 5 mg  5 mg Oral BID Madelyn Flavors A, MD   5 mg at 03/25/17 1109  . oxyCODONE-acetaminophen (PERCOCET/ROXICET) 5-325 MG per tablet 1 tablet  1 tablet Oral  Q6H PRN Montez Morita, PA-C   1 tablet at 03/25/17 1204  . polyethylene glycol (MIRALAX / GLYCOLAX) packet 17 g  17 g Oral Daily PRN Katrinka Blazing, Rondell A, MD      . predniSONE (DELTASONE) tablet 20 mg  20 mg Oral Q breakfast Madelyn Flavors A, MD   20 mg at 03/25/17 1610  . rOPINIRole (REQUIP) tablet 2 mg  2 mg Oral TID Madelyn Flavors A, MD   2 mg at 03/25/17 1108  . traMADol (ULTRAM) tablet 50-100 mg  50-100 mg Oral Q6H PRN Montez Morita, PA-C   50 mg at 03/25/17 9604  . white petrolatum (VASELINE) gel   Topical PRN Jacalyn Lefevre, MD         Discharge Medications: Please see discharge summary for a list of discharge medications.  Relevant Imaging Results:  Relevant Lab Results:   Additional Information SS#: [id number]  Burna Sis, LCSW

## 2017-03-25 NOTE — Evaluation (Signed)
Occupational Therapy Evaluation Patient Crystal [name] MRN: [id number] DOB: 1951/02/03 Today's Date: 03/25/2017    History of Present Illness Pt admitted after falling, resulting in L periprosthetic femur fx. Underwent ORIF with bone grafting 03/24/17. PMH; OSA, restless leg syndrome, depression, fibromyalgia, HTN, chrohns disease. Pt with multiple orthopedic surgeries including her back and B shoulders, currently in OPPT for R knee cyst.    Clinical Impression   Pt was independent prior to admission. Presents with chronic back pain and L hip pain. She requires 2 person assist for mobility and set up to total assist for ADL. Pt will need post acute rehab prior to return home. Will follow.    Follow Up Recommendations  SNF;Supervision/Assistance - 24 hour    Equipment Recommendations       Recommendations for Other Services       Precautions / Restrictions Precautions Precautions: Fall Restrictions Weight Bearing Restrictions: Yes LLE Weight Bearing: Non weight bearing      Mobility Bed Mobility Overal bed mobility: Needs Assistance Bed Mobility: Supine to Sit     Supine to sit: +2 for physical assistance;Mod assist     General bed mobility comments: attempted log roll technique initially, but unable to tolerate, performed helicopter technique with less pain, assist to support L LE and raise trunk, pt able to advance hips to EOB while L LE was supported  Transfers Overall transfer level: Needs assistance Equipment used: Rolling walker (2 wheeled) Transfers: Sit to/from [employer] Transfers Sit to Stand: +2 physical assistance;Min assist Stand pivot transfers: +2 physical assistance;Min assist       General transfer comment: assist to steady and to assure pt was adhering to NWB precaution on L LE, cues for technique     Balance Overall balance assessment: Needs assistance   Sitting balance-Leahy Scale: Good       Standing balance-Leahy  Scale: Poor Standing balance comment: B UE support required in static standing                           ADL either performed or assessed with clinical judgement   ADL Overall ADL's : Needs assistance/impaired Eating/Feeding: Independent;Bed level   Grooming: Brushing hair;Oral care;Sitting;Set up   Upper Body Bathing: Set up;Sitting   Lower Body Bathing: +2 for physical assistance;Total assistance;Sit to/from stand   Upper Body Dressing : Minimal assistance;Sitting   Lower Body Dressing: Total assistance;+2 for physical assistance;Sit to/from stand   Toilet Transfer: +2 for physical assistance;Minimal assistance;Stand-pivot;RW Toilet Transfer Details (indicate cue type and reason): simulated to chair Toileting- Clothing Manipulation and Hygiene: Maximal assistance;+2 for physical assistance;Sit to/from stand               Vision Baseline Vision/History: Wears glasses Wears Glasses: Reading only Patient Visual Report: No change from baseline       Perception     Praxis      Pertinent Vitals/Pain Pain Assessment: 0-10 Pain Score: 8  Pain Location: lower back and L hip/knee Pain Descriptors / Indicators: Aching;Sharp Pain Intervention(s): Monitored during session;Premedicated before session;Repositioned;Ice applied     Hand Dominance Left   Extremity/Trunk Assessment Upper Extremity Assessment Upper Extremity Assessment: Overall WFL for tasks assessed (reports B rotator cuff repairs)   Lower Extremity Assessment Lower Extremity Assessment: Defer to PT evaluation   Cervical / Trunk Assessment Cervical / Trunk Assessment: Other exceptions (hx of chronic back pain, surgery)   Communication Communication Communication: No difficulties  Cognition Arousal/Alertness: Awake/alert Behavior During Therapy: WFL for tasks assessed/performed Overall Cognitive Status: Within Functional Limits for tasks assessed                                      General Comments       Exercises     Shoulder Instructions      Home Living Family/patient expects to be discharged to:: Private residence Living Arrangements: Spouse/significant other Available Help at Discharge: Family;Available PRN/intermittently Type of Home: House Home Access: Stairs to enter [employer] of Steps: 3 Entrance Stairs-Rails: None Home Layout: Multi-level;Bed/bath upstairs Alternate Level Stairs-Number of Steps: 8 Alternate Level Stairs-Rails: Right Bathroom Shower/Tub: Tub/shower unit;Walk-in shower         Home Equipment: Dan Humphreys - 2 wheels;Cane - single point;Grab bars - tub/shower;Wheelchair - power   Additional Comments: Thinks she has a BSC but not sure, has grandfathers power chair, but it is old and is separated      Prior Functioning/Environment Level of Independence: Independent        Comments: was having PT on knees due to cyst on R and twisted L falling on last step at her daughters while holding grandson        OT Problem List: Decreased activity tolerance;Impaired balance (sitting and/or standing);Pain;Decreased knowledge of use of DME or AE      OT Treatment/Interventions: Self-care/ADL training;DME and/or AE instruction;Therapeutic activities;Patient/family education;Balance training    OT Goals(Current goals can be found in the care plan section) Acute Rehab OT Goals Patient Stated Goal: to return to PLOF OT Goal Formulation: With patient Time For Goal Achievement: 05/08/17 Potential to Achieve Goals: Good ADL Goals Pt Will Perform Lower Body Bathing: with min guard assist;with adaptive equipment;sit to/from stand Pt Will Perform Lower Body Dressing: with min guard assist;with adaptive equipment;sit to/from stand Pt Will Transfer to Toilet: with min guard assist;ambulating;bedside commode Pt Will Perform Toileting - Clothing Manipulation and hygiene: with min guard assist;sitting/lateral leans Additional ADL  Goal #1: Pt will perform bed mobility with min assist in preparation for ADL.  OT Frequency: Min 2X/week   Barriers to D/C:            Co-evaluation PT/OT/SLP Co-Evaluation/Treatment: Yes Reason for Co-Treatment: For patient/therapist safety   OT goals addressed during session: ADL's and self-care      AM-PAC PT "6 Clicks" Daily Activity     Outcome Measure Help from another person eating meals?: None Help from another person taking care of personal grooming?: A Little Help from another person toileting, which includes using toliet, bedpan, or urinal?: A Lot Help from another person bathing (including washing, rinsing, drying)?: A Lot Help from another person to put on and taking off regular upper body clothing?: A Little Help from another person to put on and taking off regular lower body clothing?: Total 6 Click Score: 15   End of Session Equipment Utilized During Treatment: Gait belt;Rolling walker Nurse Communication: Mobility status  Activity Tolerance: Patient tolerated treatment well Patient left: in chair;with call bell/phone within reach;with nursing/sitter in room  OT Visit Diagnosis: Unsteadiness on feet (R26.81);Other abnormalities of gait and mobility (R26.89);History of falling (Z91.81);Muscle weakness (generalized) (M62.81);Pain                Time: 1610-9604 OT Time Calculation (min): 32 min Charges:  OT General Charges $OT Visit: 1 Visit OT Evaluation $OT Eval Moderate Complexity: 1 Mod G-Codes:  03/25/2017 Martie Round, OTR/L Pager: [phone]  Iran Planas, Crystal [name] 03/25/2017, 10:55 AM

## 2017-03-25 NOTE — Progress Notes (Signed)
Orthopedic Trauma Service Progress Note   Patient ID: Crystal [name] MRN: [id number] DOB/AGE: 03-Oct-1950 66 y.o.  Subjective:  C/o pain L leg nsg reports that pt falling asleep with pain meds  Chronic tramadol use  States she does not have a bed or bathroom on main level of house and will likely need SNF  No other complaints noted   Review of Systems  Constitutional: Negative for chills and fever.  Respiratory: Negative for shortness of breath.   Cardiovascular: Negative for chest pain and palpitations.  Gastrointestinal: Negative for abdominal pain, nausea and vomiting.    Objective:   VITALS:   Vitals:   03/24/17 1731 03/24/17 2139 03/25/17 0141 03/25/17 0510  BP: 116/70 (!) 112/59 (!) 102/52 114/64  Pulse: 89 97 81 83  Resp: Temp: (!) 97.4 F (36.3 C) 97.6 F (36.4 C) 97.8 F (36.6 C) 97.8 F (36.6 C)  TempSrc: Oral Oral Oral Oral  SpO2: 100% 97% 94% 96%  Weight:      Height:        Estimated body mass index is 28.84 kg/m as calculated from the following:   Height as of this encounter:  (1.626 m).   Weight as of this encounter: 76.2 kg (168 lb).   Intake/Output      09/25 0701 - 09/26 0700 09/26 0701 - 09/27 0700   P.O. 430    I.V. (mL/kg) 4032.5 (52.9)    IV Piggyback 550    Total Intake(mL/kg) 5012.5 (65.8)    Urine (mL/kg/hr) 1825 (1)    Blood 450    Total Output 2275     Net +2737.5            LABS  Results for orders placed or performed during the hospital encounter of 03/23/17 (from the past 24 hour(s))  CBC     Status: Abnormal   Collection Time: 03/24/17  6:06 PM  Result Value Ref Range   WBC 13.3 (H) 4.0 - 10.5 K/uL   RBC 3.21 (L) 3.87 - 5.11 MIL/uL   Hemoglobin 9.1 (L) 12.0 - 15.0 g/dL   HCT 40.9 (L) 81.1 - 91.4 %   MCV 89.1 78.0 - 100.0 fL   MCH 28.3 26.0 - 34.0 pg   MCHC 31.8 30.0 - 36.0 g/dL   RDW 78.2 95.6 - 21.3 %   Platelets 230 150 - 400 K/uL  Creatinine, serum     Status:  None   Collection Time: 03/24/17  6:06 PM  Result Value Ref Range   Creatinine, Ser 0.78 0.44 - 1.00 mg/dL   GFR calc non Af Amer >60 >60 mL/min   GFR calc Af Amer >60 >60 mL/min  CBC     Status: Abnormal   Collection Time: 03/25/17  4:08 AM  Result Value Ref Range   WBC 10.3 4.0 - 10.5 K/uL   RBC 2.82 (L) 3.87 - 5.11 MIL/uL   Hemoglobin 8.0 (L) 12.0 - 15.0 g/dL   HCT 08.6 (L) 57.8 - 46.9 %   MCV 89.4 78.0 - 100.0 fL   MCH 28.4 26.0 - 34.0 pg   MCHC 31.7 30.0 - 36.0 g/dL   RDW 62.9 52.8 - 41.3 %   Platelets 225 150 - 400 K/uL  Basic metabolic panel     Status: Abnormal   Collection Time: 03/25/17  4:08 AM  Result Value Ref Range   Sodium 141 135 - 145 mmol/L   Potassium 3.7 3.5 - 5.1 mmol/L  Chloride 104 101 - 111 mmol/L   CO2 31 22 - 32 mmol/L   Glucose, Bld 116 (H) 65 - 99 mg/dL   BUN 15 6 - 20 mg/dL   Creatinine, Ser 6.33 0.44 - 1.00 mg/dL   Calcium 7.9 (L) 8.9 - 10.3 mg/dL   GFR calc non Af Amer >60 >60 mL/min   GFR calc Af Amer >60 >60 mL/min   Anion gap 6 5 - 15  Protime-INR     Status: None   Collection Time: 03/25/17  4:08 AM  Result Value Ref Range   Prothrombin Time 14.9 11.4 - 15.2 seconds   INR 1.17      PHYSICAL EXAM:    Gen: in bed, NAD, appears well  Lungs: breathing unlabored  Cardiac: RRR Abd: + BS, NTND  Ext:       Left Lower Extremity   Dressing c/d/i  Motor and sensory functions intact and at baseline for pt  Swelling controlled  Ext warm   + DP pulse    Assessment/Plan: 1 Day Post-Op   Principal Problem:   Periprosthetic supracondylar fracture of femur, left  Active Problems:   Obstructive sleep apnea   RESTLESS LEG SYNDROME   Essential hypertension   Hypothyroid   Depression, major, recurrent, in partial remission (HCC)   Fall at home, initial encounter   Hypokalemia   Anti-infectives    Start     Dose/Rate Route Frequency Ordered Stop   03/24/17 2000  cefTRIAXone (ROCEPHIN) 1 g in dextrose 5 % 50 mL IVPB     1 g 100  mL/hr over 30 Minutes Intravenous Every 24 hours 03/23/17 2017     03/24/17 1900  ceFAZolin (ANCEF) IVPB 2g/100 mL premix     2 g 200 mL/hr over 30 Minutes Intravenous Every 6 hours 03/24/17 1736 03/25/17 0217   03/24/17 1045  ceFAZolin (ANCEF) IVPB 2g/100 mL premix     2 g 200 mL/hr over 30 Minutes Intravenous On call to O.R. 03/24/17 1037 03/24/17 1120   03/24/17 0916  ceFAZolin (ANCEF) 2-4 GM/100ML-% IVPB    Comments:  [name]   : cabinet override      03/24/17 0916 03/24/17 1120   03/23/17 2015  cefTRIAXone (ROCEPHIN) 1 g in dextrose 5 % 50 mL IVPB     1 g 100 mL/hr over 30 Minutes Intravenous  Once 03/23/17 2010 03/24/17 0309    .  POD/HD#: 1  66 y/o female s/p ground level fall with L periprosthetic distal femur fracture  -L periprosthetic distal femur fracture s/p ORIF  NWB x 8 weeks  Knee ROM as tolerated (constrained TKA)  PT/OT evals  Dressing change tomorrow  Ice PRN, elevate   - Pain management:  On tramadol chronically   Quite sedated with percocet per nsg  Trial ultram first  Percocet for severe breakthrough pain    - ABL anemia/Hemodynamics  Check CBC this afternoon  Will likely need transfusion, not surprising given fracture  - Medical issues   Per medical service   - DVT/PE prophylaxis:  lovenox x 21 days  - ID:   Rocephin for UTI  Follow up on cultures  - Metabolic Bone Disease:  Vitamin D ordered   Had good levels about 4 months ago   - Activity:  Up with therapy  NWB L leg x 8 weeks  - FEN/GI prophylaxis/Foley/Lines:  Reg diet   protonix   Dc foley   - Dispo:  Therapy evals  SW consult for  SNF    Mearl Latin, PA-C [facility] [phone] Traci Sermon (C) 03/25/2017, 9:09 AM

## 2017-03-25 NOTE — Op Note (Signed)
NAME:  Crystal [name], Crystal [name] NO.:  MEDICAL RECORD NO.:  [id number]  LOCATION:                                 FACILITY:  PHYSICIAN:  Astrid Divine. Marcelino Scot, M.D.      DATE OF BIRTH:  DATE OF PROCEDURE:  03/24/2017 DATE OF DISCHARGE:                              OPERATIVE REPORT   PREOPERATIVE DIAGNOSIS:  Left periprosthetic supracondylar femur fracture.  POSTOPERATIVE DIAGNOSIS:  Left periprosthetic supracondylar femur fracture.  PROCEDURE:  Open reduction and internal fixation of left supracondylar femur fracture with bone grafting.  SURGEON:  Astrid Divine. Marcelino Scot, M.D.  ASSISTANT:  Ainsley Spinner, PA-C.  ANESTHESIA:  General.  COMPLICATIONS:  None.  TOURNIQUET:  None.  I/O:  1000 mL crystalloid, 500 mL colloid/UOP 650 mL, EBL 400 mL.  SPECIMENS:  None.  DISPOSITION:  To PACU.  CONDITION:  Stable.  BRIEF SUMMARY AND INDICATION FOR PROCEDURE:  Crystal [name] is a 66- year-old female status post revision of left total knee arthroplasty by Dr. Vickey Huger about 9 years ago.  She sustained a ground-level fall resulting in breakage of her femoral metaphysis extending down into the supracondylar region and up into the shaft at the level of the revision stem.  I did discuss further risks and benefits of surgical repair including potential for infection, nerve injury, vessel injury, DVT, PE, heart attack, stroke, malunion, nonunion, symptomatic hardware, and need for further surgery among others.  She acknowledged these risks and did wish to proceed.  BRIEF SUMMARY OF PROCEDURE:  The patient was taken to the operating room where general anesthesia was induced.  Her left lower extremity was prepped and draped in usual sterile fashion.  No tourniquet was used during the procedure.  I began with a time-out and then standard lateral approach was made to the distal femur going through the IT band, reflecting the vastus anteriorly, and exposing the fracture site,  which was cleaned with curette and lavage.  C-arm was used to help identify the appropriate starting point.  I did not extend all the way up the leg for placement of the plate, but up far enough to gain control with cerclage fixation.  My assistant maintained reduction by pulling traction and a series of bumps under the leg on the sagittal plane.  A Satinsky clamp was used to pass a small caliber K-wires around the bone and then, these were sequentially tightened to obtain reduction.  The plate was then brought in.  The 15-hole Biomet distal femoral locking plate was used extending it proximally up towards the flare of the femur and distally keeping it as proximal as possible to reduce its profile distally and to match the contour of the bone and also as posterior as possible to gain fixation behind the stem.  This was difficult because of the change in contour of her distal femur, which had a tendency to make plate somewhat prominent anteriorly.  However, because of the other factors, we accepted its position after placing in its posterior and proximal as possible for fixation of the fracture.  The plate was pinned in this position distally, provisionally, and then a standard screw placed proximally.  Additional standard screws were placed proximally and then sequential fixation into the femur distally.  A cerclage wire was placed midshaft in addition to the small caliber cerclage wires. This was placed __________ within the plate.  Final AP and lateral images showed appropriate hardware placement, trajectory, and length. End caps were placed over all screws to convert this into a fixed angle device.  Following this, extensive bone grafting was performed with a large Infuse bone over the fracture site at the medial side of the anterior medial condyle along the lateral side at the end of the stem and at the anterior cortex proximal to this where the fracture site exited.  Extensive graft  was also placed between the bone and the plate to build bone mass for the patient.  Wounds were irrigated thoroughly and closed in standard layered fashion with #1 Vicryl, 0 Vicryl, 2-0 Vicryl, 2-0 nylon.  Sterile gently compressive dressing was then applied from foot to thigh.  The leg was brought in extension with excellent range of motion and alignment clinically.  Ainsley Spinner, PA-C, did assist me throughout.  An assistant was absolutely necessary given the difficulty of producing and maintaining reduction and then with also retraction for instrumentation around and through the femur.  PROGNOSIS:  The patient will be nonweightbearing with unrestricted range of motion of the hip, knee, and ankle.  We would anticipate complete union in 8 weeks or so that she is a nonsmoker.  She will be on formal pharmacologic DVT prophylaxis.  We are anticipating either a rehab stay for skilled nursing given her 3 story home without a bathroom or bedroom on the ground floor and with her infirm husband.     Astrid Divine. Marcelino Scot, M.D.     MHH/MEDQ  D:  03/24/2017  T:  03/24/2017  Job:  [id number]

## 2017-03-25 NOTE — Progress Notes (Signed)
Foley was removed. Pt is due to void at 2200.

## 2017-03-25 NOTE — Progress Notes (Signed)
Paged on-call provider r/t patients request to change from Percocet to oxycontin. On-call called and said she needed to check with MD in am about that.

## 2017-03-26 DIAGNOSIS — F3341 Major depressive disorder, recurrent, in partial remission: Secondary | ICD-10-CM

## 2017-03-26 LAB — URINE CULTURE: Culture: NO GROWTH

## 2017-03-26 LAB — PROTIME-INR
INR: 1.09
Prothrombin Time: 14 seconds (ref 11.4–15.2)

## 2017-03-26 LAB — BASIC METABOLIC PANEL
Anion gap: 6 (ref 5–15)
BUN: 13 mg/dL (ref 6–20)
CO2: 30 mmol/L (ref 22–32)
Calcium: 7.8 mg/dL — ABNORMAL LOW (ref 8.9–10.3)
Chloride: 103 mmol/L (ref 101–111)
Creatinine, Ser: 0.66 mg/dL (ref 0.44–1.00)
GFR calc Af Amer: >60 mL/min (ref >60)
GFR calc non Af Amer: >60 mL/min (ref >60)
Glucose, Bld: 104 mg/dL — ABNORMAL HIGH (ref 65–99)
Potassium: 3.5 mmol/L (ref 3.5–5.1)
Sodium: 139 mmol/L (ref 135–145)

## 2017-03-26 LAB — CBC
HCT: 24.8 % — ABNORMAL LOW (ref 36.0–46.0)
Hemoglobin: 7.7 g/dL — ABNORMAL LOW (ref 12.0–15.0)
MCH: 28.1 pg (ref 26.0–34.0)
MCHC: 31 g/dL (ref 30.0–36.0)
MCV: 90.5 fL (ref 78.0–100.0)
Platelets: 212 10*3/uL (ref 150–400)
RBC: 2.74 MIL/uL — ABNORMAL LOW (ref 3.87–5.11)
RDW: 14.9 % (ref 11.5–15.5)
WBC: 8 10*3/uL (ref 4.0–10.5)

## 2017-03-26 LAB — MAGNESIUM: Magnesium: 2.1 mg/dL (ref 1.7–2.4)

## 2017-03-26 LAB — TSH: TSH: 0.445 u[IU]/mL (ref 0.350–4.500)

## 2017-03-26 MED ORDER — BISOPROLOL-HYDROCHLOROTHIAZIDE 10-6.25 MG PO TABS: 1.0000 | ORAL_TABLET | Freq: Every day | ORAL | Status: DC

## 2017-03-26 MED ORDER — OXYCODONE HCL 5 MG PO TABS: 5.0000 mg | ORAL_TABLET | Freq: Four times a day (QID) | ORAL | 0 refills | Status: DC | PRN

## 2017-03-26 MED ORDER — ROPINIROLE HCL 2 MG PO TABS: ORAL_TABLET | ORAL | Status: DC

## 2017-03-26 MED ORDER — DULOXETINE HCL 60 MG PO CPEP: ORAL_CAPSULE | ORAL | Status: DC

## 2017-03-26 MED ORDER — OXYCODONE-ACETAMINOPHEN 5-325 MG PO TABS: 1.0000 | ORAL_TABLET | Freq: Four times a day (QID) | ORAL | 0 refills | Status: DC | PRN

## 2017-03-26 MED ORDER — ENOXAPARIN SODIUM 40 MG/0.4ML ~~LOC~~ SOLN: 40.0000 mg | SUBCUTANEOUS | 0 refills | Status: DC

## 2017-03-26 MED ORDER — VITAMIN D (ERGOCALCIFEROL) 1.25 MG (50000 UNIT) PO CAPS: 50000.0000 [IU] | ORAL_CAPSULE | Freq: Every day | ORAL | Status: DC

## 2017-03-26 NOTE — Progress Notes (Signed)
Orthopedic Trauma Service Progress Note   Patient ID: Crystal [name] MRN: [id number] DOB/AGE: 1950-10-20 66 y.o.  Subjective:  Doing ok C/o pain in L leg as well as chronic back pain Asking for oxycontin  Pt has bed at [facility] for today    ROS As above  Objective:   VITALS:   Vitals:   03/25/17 0510 03/25/17 1317 03/25/17 2101 03/26/17 0542  BP: 114/64 124/65 117/65 107/60  Pulse: 83 88 98 86  Resp: Temp: 97.8 F (36.6 C) 98.1 F (36.7 C) 99.4 F (37.4 C) 98.3 F (36.8 C)  TempSrc: Oral Oral Oral Oral  SpO2: 96% 93% 96% 93%  Weight:      Height:        Estimated body mass index is 28.84 kg/m as calculated from the following:   Height as of this encounter:  (1.626 m).   Weight as of this encounter: 76.2 kg (168 lb).   Intake/Output      09/26 0701 - 09/27 0700 09/27 0701 - 09/28 0700   P.O. 236    Total Intake(mL/kg) 236 (3.1)    Urine (mL/kg/hr) 1050 (0.6)    Total Output 1050     Net -814          Urine Occurrence 0 x 1 x   Stool Occurrence 1 x 1 x     LABS  Results for orders placed or performed during the hospital encounter of 03/23/17 (from the past 24 hour(s))  Culture, Urine     Status: None   Collection Time: 03/25/17  1:21 PM  Result Value Ref Range   Specimen Description URINE, CATHETERIZED    Special Requests NONE    Culture NO GROWTH    Report Status 03/26/2017 FINAL   CBC     Status: Abnormal   Collection Time: 03/26/17  3:54 AM  Result Value Ref Range   WBC 8.0 4.0 - 10.5 K/uL   RBC 2.74 (L) 3.87 - 5.11 MIL/uL   Hemoglobin 7.7 (L) 12.0 - 15.0 g/dL   HCT 40.9 (L) 81.1 - 91.4 %   MCV 90.5 78.0 - 100.0 fL   MCH 28.1 26.0 - 34.0 pg   MCHC 31.0 30.0 - 36.0 g/dL   RDW 78.2 95.6 - 21.3 %   Platelets 212 150 - 400 K/uL  Basic metabolic panel     Status: Abnormal   Collection Time: 03/26/17  3:54 AM  Result Value Ref Range   Sodium 139 135 - 145 mmol/L   Potassium 3.5 3.5 - 5.1 mmol/L   Chloride 103 101 - 111 mmol/L   CO2 30 22 - 32 mmol/L   Glucose, Bld 104 (H) 65 - 99 mg/dL   BUN 13 6 - 20 mg/dL   Creatinine, Ser 0.86 0.44 - 1.00 mg/dL   Calcium 7.8 (L) 8.9 - 10.3 mg/dL   GFR calc non Af Amer >60 >60 mL/min   GFR calc Af Amer >60 >60 mL/min   Anion gap 6 5 - 15  Protime-INR     Status: None   Collection Time: 03/26/17  3:54 AM  Result Value Ref Range   Prothrombin Time 14.0 11.4 - 15.2 seconds   INR 1.09   TSH     Status: None   Collection Time: 03/26/17  3:54 AM  Result Value Ref Range   TSH 0.445 0.350 - 4.500 uIU/mL  Magnesium     Status: None   Collection Time: 03/26/17  3:54 AM  Result Value Ref Range   Magnesium 2.1 1.7 - 2.4 mg/dL     PHYSICAL EXAM:   Gen: in bed, appears very comfortable  Lungs: unlabored Cardiac: regular  Ext:       Left Lower Extremity   Dressing removed  Incisions look great, scant bloody drainage  Swelling stable  Distal motor and sensory functions at baseline  Ext warm  + DP pulse  No DCT     Assessment/Plan: 2 Days Post-Op   Principal Problem:   Periprosthetic supracondylar fracture of femur, left  Active Problems:   Obstructive sleep apnea   RESTLESS LEG SYNDROME   Essential hypertension   Hypothyroid   Depression, major, recurrent, in partial remission (HCC)   Fall at home, initial encounter   Hypokalemia   Anti-infectives    Start     Dose/Rate Route Frequency Ordered Stop   03/24/17 2000  cefTRIAXone (ROCEPHIN) 1 g in dextrose 5 % 50 mL IVPB     1 g 100 mL/hr over 30 Minutes Intravenous Every 24 hours 03/23/17 2017 03/26/17 0204   03/24/17 1900  ceFAZolin (ANCEF) IVPB 2g/100 mL premix     2 g 200 mL/hr over 30 Minutes Intravenous Every 6 hours 03/24/17 1736 03/25/17 0217   03/24/17 1045  ceFAZolin (ANCEF) IVPB 2g/100 mL premix     2 g 200 mL/hr over 30 Minutes Intravenous On call to O.R. 03/24/17 1037 03/24/17 1120   03/24/17 0916  ceFAZolin (ANCEF) 2-4 GM/100ML-% IVPB    Comments:  [name]   : cabinet override      03/24/17 0916 03/24/17 1120   03/23/17 2015  cefTRIAXone (ROCEPHIN) 1 g in dextrose 5 % 50 mL IVPB     1 g 100 mL/hr over 30 Minutes Intravenous  Once 03/23/17 2010 03/24/17 0309    .  POD/HD#: 2   66 y/o female s/p ground level fall with L periprosthetic distal femur fracture   -L periprosthetic distal femur fracture s/p ORIF             NWB x 8 weeks             Knee ROM as tolerated (constrained TKA)             PT/OT              Dressing changed today, dressing changes as needed starting tomorrow   Dry dressing ok   Ok to shower              Ice PRN, elevate  TED or ace wrap     - Pain management:             increase percocet dose to 1-2 po q6h prn   Also add breakthrough oxy IR   Discussed that oxycontin is for chronic pain and my objective is to address her acute fracture pain               - ABL anemia/Hemodynamics             asymptomatic      - Medical issues              Per medical service    - DVT/PE prophylaxis:             lovenox x 21 days    - Metabolic Bone Disease:             Vitamin D pending  Had good levels about 4 months ago    - Activity:             Up with therapy             NWB L leg x 8 weeks   - FEN/GI prophylaxis/Foley/Lines:             Reg diet              protonix   - Dispo:             SNF  Follow up with ortho in 10-14 days, please call for appointment    Mearl Latin, PA-C [facility] [phone] Traci Sermon (C) 03/26/2017, 12:49 PM

## 2017-03-26 NOTE — Progress Notes (Signed)
CSW followed up with patient at bedside- they choose [facility] for rehab  Burna Sis, LCSW Clinical Social Worker [phone]

## 2017-03-26 NOTE — Discharge Summary (Addendum)
Physician Discharge Summary  Crystal [name] DQQ:[id number] DOB: Jul 19, 1950 DOA: 03/23/2017  PCP: Unk Pinto, MD  Admit date: 03/23/2017 Discharge date: 03/26/2017  Admitted From: Home Disposition:  SNF  Recommendations for Outpatient Follow-up:  1. Follow up with provider at SNF at earliest convenience with repeat CBC/BMP 2. Follow up with orthopedics as scheduled 3. Patient might benefit from outpatient pain management evaluation Activity and wound care as per orthopedics recommendations    Home Health: No   Equipment/Devices: None  Discharge Condition: Stable  CODE STATUS: Full  Diet recommendation: Heart Healthy   Brief/Interim Summary: 66 year old female with past medical history of hypertension, fibromyalgia, depression, OSA and Crohn's disease presented after a mechanical fall. She was admitted with left femur fracture and underwent surgical intervention on 03/24/2017 by orthopedics. She will be discharged to SNF once bed is available.  Discharge Diagnoses:  Principal Problem:   Periprosthetic supracondylar fracture of femur, left  Active Problems:   Obstructive sleep apnea   RESTLESS LEG SYNDROME   Essential hypertension   Hypothyroid   Depression, major, recurrent, in partial remission ([place])   Fall at home, initial encounter   Hypokalemia  1. Left femur fracture.  - Status post ORIF by orthopedics on 03/24/2017 - Activity as per orthopedics and PT/OT recommendations - Pain management - Fall precautions - discharge to SNF once bed is available.  2. Urinary tract infection.  - S/P 3 days of Rocephin. Monitor off Antibiotics  3. Hypokalemia.  - Resolved.  4. Hypertension.  Continue home antihypertensive regimen  5. Hypothyroidism. Continue levothyroxine  6. Crohn's disease. Stable with no signs of exacerbation   7. Acute blood loss anemia: -Probably postop.  - outpatient followup   Discharge Instructions  Discharge Instructions    Call MD for:  difficulty breathing, headache or visual disturbances    Complete by:  As directed    Call MD for:  extreme fatigue    Complete by:  As directed    Call MD for:  hives    Complete by:  As directed    Call MD for:  persistant dizziness or light-headedness    Complete by:  As directed    Call MD for:  persistant nausea and vomiting    Complete by:  As directed    Call MD for:  redness, tenderness, or signs of infection (pain, swelling, redness, odor or green/yellow discharge around incision site)    Complete by:  As directed    Call MD for:  severe uncontrolled pain    Complete by:  As directed    Call MD for:  temperature >100.4    Complete by:  As directed    Diet - low sodium heart healthy    Complete by:  As directed    Discharge instructions    Complete by:  As directed    Activity and wound care as per orthopedics recommendations   Increase activity slowly    Complete by:  As directed      Allergies as of 03/26/2017      Reactions   Atorvastatin Other (See Comments)   Extreme pain   Codeine Other (See Comments)   Headache   Pravastatin Other (See Comments)   Exreme pain   Statins Other (See Comments)   Extreme pain   Amitriptyline Other (See Comments)   Unspecified   Fish Oil Nausea Only   Nuvigil [armodafinil] Other (See Comments)   Unspecified   Erythromycin Other (See Comments)   Reaction unspecified  Erythromycin Base Rash   Flax Seed [bio-flax] Rash   Linseed   Hydrocodone-acetaminophen Rash   Morphine Nausea And Vomiting, Rash   Nsaids Nausea Only   Sertraline Rash   Sinemet [carbidopa W-levodopa] Rash   Sulfamethoxazole Rash      Medication List    STOP taking these medications   levofloxacin 500 MG tablet Commonly known as:  LEVAQUIN   neomycin-polymyxin-hydrocortisone 3.5-10000-1 OTIC suspension Commonly known as:  CORTISPORIN   predniSONE 20 MG tablet Commonly known as:  DELTASONE   promethazine-dextromethorphan 6.25-15  MG/5ML syrup Commonly known as:  PROMETHAZINE-DM   traMADol 50 MG tablet Commonly known as:  ULTRAM     TAKE these medications   acyclovir 800 MG tablet Commonly known as:  ZOVIRAX TAKE ONE TABLET BY MOUTH THREE TIMES DAILY WITH MEALS AND 2 NIGHTLY AT BEDTIME.   aspirin EC 81 MG tablet Take 81 mg by mouth daily.   benzonatate 200 MG capsule Commonly known as:  TESSALON Take 1 perle 3 x / day to prevent cough   bisoprolol-hydrochlorothiazide 10-6.25 MG tablet Commonly known as:  ZIAC Take 1 tablet by mouth daily. What changed:  See the new instructions.   buPROPion 300 MG 24 hr tablet Commonly known as:  WELLBUTRIN XL TAKE 1 TABLET BY MOUTH EVERY MORNING FOR MOOD AND FOCUS ATTENTION   cyclobenzaprine 5 MG tablet Commonly known as:  FLEXERIL TAKE 1 TABLET BY MOUTH 2 TIMES DAILY AS NEEDED FOR MUSCLE SPASMS.   diclofenac sodium 1 % Gel Commonly known as:  VOLTAREN Apply 2 g topically 2 (two) times daily.   DULoxetine 60 MG capsule Commonly known as:  CYMBALTA TAKE 1 CAPSULE BY MOUTH DAILY FOR PAIN.   enoxaparin 40 MG/0.4ML injection Commonly known as:  LOVENOX Inject 0.4 mLs (40 mg total) into the skin daily.   fenofibrate micronized 134 MG capsule Commonly known as:  LOFIBRA TAKE 1 CAPSULE BY MOUTH DAILY BEFORE BREAKFAST   fluticasone 50 MCG/ACT nasal spray Commonly known as:  FLONASE USE TWO SPRAYS IN EACH NOSTRIL DAILY AS NEEDED   gabapentin 300 MG capsule Commonly known as:  NEURONTIN TAKE ONE CAPSULE 3 TIMES A DAY   IRON PO Take 25 mg by mouth daily.   levothyroxine 100 MCG tablet Commonly known as:  SYNTHROID, LEVOTHROID Take 100 mcg by mouth daily before breakfast.   LINZESS 290 MCG Caps capsule Generic drug:  linaclotide Take 290 mcg by mouth daily.   meclizine 25 MG tablet Commonly known as:  ANTIVERT Take 1 tablet (25 mg total) by mouth 3 (three) times daily as needed for dizziness.   OVER THE COUNTER MEDICATION Take 1 capsule by mouth  daily.   OVER THE COUNTER MEDICATION Take 1 capsule by mouth daily.   oxybutynin 5 MG tablet Commonly known as:  DITROPAN TAKE 1 TABLET BY MOUTH 2 TIMES DAILY   oxyCODONE 5 MG immediate release tablet Commonly known as:  ROXICODONE Take 1-2 tablets (5-10 mg total) by mouth every 6 (six) hours as needed for breakthrough pain (take between percocet for breakthrough pain only).   oxyCODONE-acetaminophen 5-325 MG tablet Commonly known as:  PERCOCET/ROXICET Take 1-2 tablets by mouth every 6 (six) hours as needed for moderate pain or severe pain.   phentermine 37.5 MG tablet Commonly known as:  ADIPEX-P TAKE 1 TABLET BY MOUTH EVERY DAY BEFORE BREAKFAST What changed:  how much to take  how to take this  when to take this  additional instructions   polyethylene glycol packet  Commonly known as:  MIRALAX / GLYCOLAX Take 17 g by mouth daily as needed for mild constipation.   ranitidine 300 MG tablet Commonly known as:  ZANTAC Take 1 tablet (300 mg total) by mouth at bedtime.   rOPINIRole 2 MG tablet Commonly known as:  REQUIP TAKE 1 TABLET 3 TIMES A DAY FOR RESTLESS LEG SYNDROME   valsartan 80 MG tablet Commonly known as:  DIOVAN 1/2-1 tablet dailyt for BP, stop losartan What changed:  how much to take  how to take this  when to take this  additional instructions   Vitamin D (Ergocalciferol) 50000 units Caps capsule Commonly known as:  DRISDOL Take 1 capsule (50,000 Units total) by mouth daily. Take on tuesday, Wednesday,thursday What changed:  additional instructions            Discharge Care Instructions        Start     Ordered   03/27/17 0000  enoxaparin (LOVENOX) 40 MG/0.4ML injection  Every 24 hours    Question:  Supervising Provider  Answer:  HANDY, MICHAEL   03/26/17 1248   03/26/17 0000  DULoxetine (CYMBALTA) 60 MG capsule     03/26/17 1203   03/26/17 0000  rOPINIRole (REQUIP) 2 MG tablet     03/26/17 1203   03/26/17 0000  Vitamin D,  Ergocalciferol, (DRISDOL) 50000 units CAPS capsule  Daily     03/26/17 1203   03/26/17 0000  Increase activity slowly     03/26/17 1203   03/26/17 0000  Diet - low sodium heart healthy     03/26/17 1203   03/26/17 0000  Discharge instructions    Comments:  Activity and wound care as per orthopedics recommendations   03/26/17 1203   03/26/17 0000  Call MD for:  temperature >100.4     03/26/17 1203   03/26/17 0000  Call MD for:  persistant nausea and vomiting     03/26/17 1203   03/26/17 0000  Call MD for:  severe uncontrolled pain     03/26/17 1203   03/26/17 0000  Call MD for:  redness, tenderness, or signs of infection (pain, swelling, redness, odor or green/yellow discharge around incision site)     03/26/17 1203   03/26/17 0000  Call MD for:  difficulty breathing, headache or visual disturbances     03/26/17 1203   03/26/17 0000  Call MD for:  hives     03/26/17 1203   03/26/17 0000  Call MD for:  persistant dizziness or light-headedness     03/26/17 1203   03/26/17 0000  Call MD for:  extreme fatigue     03/26/17 1203   03/26/17 0000  bisoprolol-hydrochlorothiazide (ZIAC) 10-6.25 MG tablet  Daily     03/26/17 1213   03/26/17 0000  oxyCODONE-acetaminophen (PERCOCET/ROXICET) 5-325 MG tablet  Every 6 hours PRN    Question:  Supervising Provider  Answer:  HANDY, MICHAEL   03/26/17 1245   03/26/17 0000  oxyCODONE (ROXICODONE) 5 MG immediate release tablet  Every 6 hours PRN    Question:  Supervising Provider  Answer:  Altamese Onarga   03/26/17 1245   03/26/17 0000  Care order/instruction     03/26/17 1301         Contact information for follow-up providers    Altamese , MD Follow up in 1 week(s).   Specialty:  Orthopedic Surgery Contact information: [address] [place] [address] [phone]  Contact information for after-discharge care    Destination    [facility] Follow up.   Specialty:  [place] information: [address] [address] [phone]                 Allergies  Allergen Reactions  . Atorvastatin Other (See Comments)    Extreme pain  . Codeine Other (See Comments)    Headache  . Pravastatin Other (See Comments)    Exreme pain  . Statins Other (See Comments)    Extreme pain  . Amitriptyline Other (See Comments)    Unspecified  . Fish Oil Nausea Only  . Nuvigil [Armodafinil] Other (See Comments)    Unspecified  . Erythromycin Other (See Comments)    Reaction unspecified  . Erythromycin Base Rash  . Flax Seed [Bio-Flax] Rash    Linseed  . Hydrocodone-Acetaminophen Rash  . Morphine Nausea And Vomiting and Rash  . Nsaids Nausea Only  . Sertraline Rash  . Sinemet [Carbidopa W-Levodopa] Rash  . Sulfamethoxazole Rash    Consultations:  Orthopedics   Procedures/Studies: Dg Chest 2 View  Result Date: 03/23/2017 CLINICAL DATA:  Femur fracture EXAM: CHEST  2 VIEW COMPARISON:  None. FINDINGS: Stable asymmetric elevation right hemidiaphragm. The lungs are clear without focal pneumonia, edema, pneumothorax or pleural effusion. The cardiopericardial silhouette is within normal limits for size. The visualized bony structures of the thorax are intact. Incomplete visualization lower cervical fusion. Telemetry leads overlie the chest. IMPRESSION: No active cardiopulmonary disease. Electronically Signed   By: Misty Stanley M.D.   On: 03/23/2017 17:40   Dg Knee 1-2 Views Left  Result Date: 03/23/2017 CLINICAL DATA:  Fall, left leg pain. EXAM: LEFT KNEE - 1-2 VIEW COMPARISON:  None. FINDINGS: AP and lateral views of the left knee are provided. There is a displaced fracture within the distal left femur, centered at the distal femoral metadiaphysis, with approximately 2 cm diastasis at the upper margin of the fracture. The femoral intramedullary stem component of the left knee arthroplasty appears to extend through  the fracture site, but appears otherwise intact and appropriately positioned. IMPRESSION: 1. Displaced fracture within the distal left femur, acute appearing, centered at the distal metadiaphysis, with approximately 2 cm diastasis at the upper margin of the fracture. 2. Femoral intramedullary stem component of the left knee arthroplasty hardware appears to extend into the fracture site, but appears otherwise intact and appropriately positioned. Inferior portion of the arthroplasty hardware is incompletely imaged but appears intact and appropriately positioned. Electronically Signed   By: Franki Cabot M.D.   On: 03/23/2017 17:47   Dg Knee Left Port  Result Date: 03/24/2017 CLINICAL DATA:  Initial evaluation status post surgery for distal left femoral fracture. EXAM: PORTABLE LEFT KNEE - 1-2 VIEW COMPARISON:  None. FINDINGS: Postoperative changes from interval ORIF of previously identified distal left femoral periprosthetic fracture. Partially visualized fracture now in anatomic alignment. There has been interval placement of lateral malleoli plate screw fixation at the distal left femur. Visualized hardware intact without complication. Tibial hardware stable in appearance. No new fracture. Postoperative soft tissue swelling and emphysema noted. IMPRESSION: Postoperative changes from interval ORIF of distal left femoral periprosthetic fracture without complication. Electronically Signed   By: Jeannine Boga M.D.   On: 03/24/2017 20:41   Dg C-arm Gt 120 Min  Result Date: 03/24/2017 CLINICAL DATA:  ORIF left femur. EXAM: LEFT FEMUR 2 VIEWS; DG C-ARM GT 120 MIN COMPARISON:  Bone scan 12/04/2016. FINDINGS: Postsurgical changes of the left femur noted with plate and screw fixation. Near alignment were noted. Total left knee replacement. Hardware intact. IMPRESSION: ORIF left femur with near anatomic alignment where visualized. Hardware intact . Electronically Signed   By: Marcello Moores  Register   On: 03/24/2017  14:43   Dg Hip Unilat With Pelvis 2-3 Views Left  Result Date: 03/23/2017 CLINICAL DATA:  Fall, left leg pain EXAM: DG HIP (WITH OR WITHOUT PELVIS) 2-3V LEFT COMPARISON:  None. FINDINGS: Single-view of the pelvis and two views of the left hip are provided. Osseous alignment is normal. No fracture line or displaced fracture fragment seen. Fusion hardware within the lower lumbar spine is incompletely imaged. Soft tissues about the left hip are unremarkable. IMPRESSION: No acute findings at the left hip. Electronically Signed   By: Franki Cabot M.D.   On: 03/23/2017 17:44   Dg Femur 1v Left  Result Date: 03/23/2017 CLINICAL DATA:  Fall, leg pain. EXAM: LEFT FEMUR 1 VIEW COMPARISON:  None. FINDINGS: Left knee arthroplasty hardware in place, incompletely imaged, femoral intramedullary stem component appearing intact and appropriately positioned. There is a displaced fracture of the distal left femur, of uncertain age, presumably acute based on accompanying plain film of the left knee. Upper femur appears intact and normally aligned. Soft tissues about the left femur are unremarkable. IMPRESSION: 1. Displaced fracture of the distal left femur, acute appearance based on accompanying plain film of the left knee. 2. Femoral component of the left knee arthroplasty hardware appears intact and appropriately positioned on this exam, extends to the fracture line on accompanying plain film of the knee. Electronically Signed   By: Franki Cabot M.D.   On: 03/23/2017 17:44   Dg Femur Min 2 Views Left  Result Date: 03/24/2017 CLINICAL DATA:  ORIF left femur. EXAM: LEFT FEMUR 2 VIEWS; DG C-ARM GT 120 MIN COMPARISON:  Bone scan 12/04/2016. FINDINGS: Postsurgical changes of the left femur noted with plate and screw fixation. Near alignment were noted. Total left knee replacement. Hardware intact. IMPRESSION: ORIF left femur with near anatomic alignment where visualized. Hardware intact . Electronically Signed   By: Marcello Moores   Register   On: 03/24/2017 14:43   Dg Femur Port Min 2 Views Left  Result Date: 03/24/2017 CLINICAL DATA:  Status post surgery for close fracture of left distal femur. EXAM: LEFT FEMUR PORTABLE 2 VIEWS COMPARISON:  None. FINDINGS: The patient has undergone interval open reduction with screw and plate fixation of the distal femur periprosthetic fracture. Additionally, 2 cerclage wires were placed around the fracture site. The hardware components and fracture fragments appear to be in anatomic alignment. IMPRESSION: 1. Status post ORIF of periprosthetic distal femur fracture. Electronically Signed   By: Kerby Moors M.D.   On: 03/24/2017 20:42     ORIF on 03/24/2017   Subjective: Patient seen and examined at bedside. She complains of right hip pain and lower extremity pain. No Overnight fever, nausea or vomiting.  Discharge Exam: Vitals:   03/25/17 2101 03/26/17 0542  BP: 117/65 107/60  Pulse: 98 86  Resp: 17 17  Temp: 99.4 F (37.4 C) 98.3 F (36.8 C)  SpO2: 96% 93%   Vitals:   03/25/17 0510 03/25/17 1317 03/25/17 2101 03/26/17 0542  BP: 114/64 124/65 117/65 107/60  Pulse: 83 88 98 86  Resp: 16  17 17   Temp: 97.8 F (36.6 C) 98.1 F (36.7 C) 99.4 F (37.4 C) 98.3 F (36.8 C)  TempSrc: Oral Oral  Oral Oral  SpO2: 96% 93% 96% 93%  Weight:      Height:        General: Pt is alert, awake, not in acute distress Cardiovascular: rate controlled, S1/S2 + Respiratory: Bilateral decreased breath sounds at bases Abdominal: Soft, NT, ND, bowel sounds +     The results of significant diagnostics from this hospitalization (including imaging, microbiology, ancillary and laboratory) are listed below for reference.     Microbiology: Recent Results (from the past 240 hour(s))  Urine culture     Status: Abnormal   Collection Time: 03/23/17  5:06 PM  Result Value Ref Range Status   Specimen Description URINE, RANDOM  Final   Special Requests NONE  Final   Culture MULTIPLE  SPECIES PRESENT, SUGGEST RECOLLECTION (A)  Final   Report Status 03/26/2017 FINAL  Final  Culture, Urine     Status: None   Collection Time: 03/25/17  1:21 PM  Result Value Ref Range Status   Specimen Description URINE, CATHETERIZED  Final   Special Requests NONE  Final   Culture NO GROWTH  Final   Report Status 03/26/2017 FINAL  Final     Labs: BNP (last 3 results) No results for input(s): BNP in the last 8760 hours. Basic Metabolic Panel:  Recent Labs Lab 03/23/17 1706 03/24/17 0501 03/24/17 1806 03/25/17 0408 03/26/17 0354  NA 143 145  --  141 139  K 3.0* 3.8  --  3.7 3.5  CL 104 106  --  104 103  CO2 29 31  --  31 30  GLUCOSE 92 126*  --  116* 104*  BUN 24* 26*  --  15 13  CREATININE 0.86 0.85 0.78 0.74 0.66  CALCIUM 9.2 9.0  --  7.9* 7.8*  MG  --   --   --   --  2.1   Liver Function Tests: No results for input(s): AST, ALT, ALKPHOS, BILITOT, PROT, ALBUMIN in the last 168 hours. No results for input(s): LIPASE, AMYLASE in the last 168 hours. No results for input(s): AMMONIA in the last 168 hours. CBC:  Recent Labs Lab 03/23/17 1706 03/24/17 0501 03/24/17 1806 03/25/17 0408 03/25/17 1204 03/26/17 0354  WBC 13.1* 9.0 13.3* 10.3 11.7* 8.0  NEUTROABS 9.5* 5.7  --   --   --   --   HGB 11.5* 11.1* 9.1* 8.0* 8.6* 7.7*  HCT 34.3* 34.4* 28.6* 25.2* 27.8* 24.8*  MCV 87.7 89.4 89.1 89.4 90.3 90.5  PLT 295 269 230 225 254 212   Cardiac Enzymes: No results for input(s): CKTOTAL, CKMB, CKMBINDEX, TROPONINI in the last 168 hours. BNP: Invalid input(s): POCBNP CBG: No results for input(s): GLUCAP in the last 168 hours. D-Dimer No results for input(s): DDIMER in the last 72 hours. Hgb A1c No results for input(s): HGBA1C in the last 72 hours. Lipid Profile No results for input(s): CHOL, HDL, LDLCALC, TRIG, CHOLHDL, LDLDIRECT in the last 72 hours. Thyroid function studies  Recent Labs  03/26/17 0354  TSH 0.445   Anemia work up No results for input(s):  VITAMINB12, FOLATE, FERRITIN, TIBC, IRON, RETICCTPCT in the last 72 hours. Urinalysis    Component Value Date/Time   COLORURINE YELLOW 03/23/2017 1706   APPEARANCEUR HAZY (A) 03/23/2017 1706   LABSPEC 1.028 03/23/2017 1706   PHURINE 5.0 03/23/2017 1706   GLUCOSEU NEGATIVE 03/23/2017 1706   HGBUR NEGATIVE 03/23/2017 1706   BILIRUBINUR NEGATIVE 03/23/2017 1706   KETONESUR NEGATIVE 03/23/2017 1706   PROTEINUR NEGATIVE 03/23/2017 1706  UROBILINOGEN 1 05/19/2013 1354   NITRITE NEGATIVE 03/23/2017 1706   LEUKOCYTESUR MODERATE (A) 03/23/2017 1706   Sepsis Labs Invalid input(s): PROCALCITONIN,  WBC,  LACTICIDVEN Microbiology Recent Results (from the past 240 hour(s))  Urine culture     Status: Abnormal   Collection Time: 03/23/17  5:06 PM  Result Value Ref Range Status   Specimen Description URINE, RANDOM  Final   Special Requests NONE  Final   Culture MULTIPLE SPECIES PRESENT, SUGGEST RECOLLECTION (A)  Final   Report Status 03/26/2017 FINAL  Final  Culture, Urine     Status: None   Collection Time: 03/25/17  1:21 PM  Result Value Ref Range Status   Specimen Description URINE, CATHETERIZED  Final   Special Requests NONE  Final   Culture NO GROWTH  Final   Report Status 03/26/2017 FINAL  Final     Time coordinating discharge: 35 minutes  SIGNED:   Aline August, MD  [facility] 03/26/2017, 12:04 PM Pager: [phone]  If 7PM-7AM, please contact night-coverage [email or web address] Password [email or web address]

## 2017-03-26 NOTE — Progress Notes (Signed)
Report called to Jacquelyn at [facility] at this time

## 2017-03-26 NOTE — Discharge Instructions (Signed)
[facility]th[facility] Trauma Service Discharge Instructions   General Discharge Instructions  WEIGHT BEARING STATUS: Nonweightbearing Left leg   RANGE OF MOTION/ACTIVITY: unrestricted range of motion Left knee. Do not place pillows under knee at rest. Keep pillows under ankle to keep leg in extension while at rest. Daily PT/OT for knee range of motion exercises   Wound Care: daily wound care starting on 03/27/2017. See below  Discharge Wound Care Instructions  Do NOT apply any ointments, solutions or lotions to pin sites or surgical wounds.  These prevent needed drainage and even though solutions like hydrogen peroxide kill bacteria, they also damage cells lining the pin sites that help fight infection.  Applying lotions or ointments can keep the wounds moist and can cause them to breakdown and open up as well. This can increase the risk for infection. When in doubt call the office.  Surgical incisions should be dressed daily.  If any drainage is noted, use one layer of adaptic, then gauze, Kerlix, and an ace wrap.  Once the incision is completely dry and without drainage, it may be left open to air out.  Showering may begin 36-48 hours later.  Cleaning gently with soap and water.  Traumatic wounds should be dressed daily as well.    One layer of adaptic, gauze, Kerlix, then ace wrap.  The adaptic can be discontinued once the draining has ceased    If you have a wet to dry dressing: wet the gauze with saline the squeeze as much saline out so the gauze is moist (not soaking wet), place moistened gauze over wound, then place a dry gauze over the moist one, followed by Kerlix wrap, then ace wrap.  DVT/PE prophylaxis: Lovenox x 21 days   Diet: as you were eating previously.  Can use over the counter stool softeners and bowel preparations, such as Miralax, to help with bowel movements.  Narcotics can be constipating.  Be sure to drink plenty of fluids  PAIN MEDICATION USE AND EXPECTATIONS  You have  likely been given narcotic medications to help control your pain.  After a traumatic event that results in an fracture (broken bone) with or without surgery, it is ok to use narcotic pain medications to help control one's pain.  We understand that everyone responds to pain differently and each individual patient will be evaluated on a regular basis for the continued need for narcotic medications. Ideally, narcotic medication use should last no more than 6-8 weeks (coinciding with fracture healing).   As a patient it is your responsibility as well to monitor narcotic medication use and report the amount and frequency you use these medications when you come to your office visit.   We would also advise that if you are using narcotic medications, you should take a dose prior to therapy to maximize you participation.  IF YOU ARE ON NARCOTIC MEDICATIONS IT IS NOT PERMISSIBLE TO OPERATE A MOTOR VEHICLE (MOTORCYCLE/CAR/TRUCK/MOPED) OR HEAVY MACHINERY DO NOT MIX NARCOTICS WITH OTHER CNS (CENTRAL NERVOUS SYSTEM) DEPRESSANTS SUCH AS ALCOHOL   STOP SMOKING OR USING NICOTINE PRODUCTS!!!!  As discussed nicotine severely impairs your body's ability to heal surgical and traumatic wounds but also impairs bone healing.  Wounds and bone heal by forming microscopic blood vessels (angiogenesis) and nicotine is a vasoconstrictor (essentially, shrinks blood vessels).  Therefore, if vasoconstriction occurs to these microscopic blood vessels they essentially disappear and are unable to deliver necessary nutrients to the healing tissue.  This is one modifiable factor that you can do  to dramatically increase your chances of healing your injury.    (This means no smoking, no nicotine gum, patches, etc)  DO NOT USE NONSTEROIDAL ANTI-INFLAMMATORY DRUGS (NSAID'S)  Using products such as Advil (ibuprofen), Aleve (naproxen), Motrin (ibuprofen) for additional pain control during fracture healing can delay and/or prevent the healing  response.  If you would like to take over the counter (OTC) medication, Tylenol (acetaminophen) is ok.  However, some narcotic medications that are given for pain control contain acetaminophen as well. Therefore, you should not exceed more than 4000 mg of tylenol in a day if you do not have liver disease.  Also note that there are may OTC medicines, such as cold medicines and allergy medicines that my contain tylenol as well.  If you have any questions about medications and/or interactions please ask your doctor/PA or your pharmacist.      ICE AND ELEVATE INJURED/OPERATIVE EXTREMITY  Using ice and elevating the injured extremity above your heart can help with swelling and pain control.  Icing in a pulsatile fashion, such as 20 minutes on and 20 minutes off, can be followed.    Do not place ice directly on skin. Make sure there is a barrier between to skin and the ice pack.    Using frozen items such as frozen peas works well as the conform nicely to the are that needs to be iced.  USE AN ACE WRAP OR TED HOSE FOR SWELLING CONTROL  In addition to icing and elevation, Ace wraps or TED hose are used to help limit and resolve swelling.  It is recommended to use Ace wraps or TED hose until you are informed to stop.    When using Ace Wraps start the wrapping distally (farthest away from the body) and wrap proximally (closer to the body)   Example: If you had surgery on your leg or thing and you do not have a splint on, start the ace wrap at the toes and work your way up to the thigh        If you had surgery on your upper extremity and do not have a splint on, start the ace wrap at your fingers and work your way up to the upper arm  IF YOU ARE IN A SPLINT OR CAST DO NOT REMOVE IT FOR ANY REASON   If your splint gets wet for any reason please contact the office immediately. You may shower in your splint or cast as long as you keep it dry.  This can be done by wrapping in a cast cover or garbage back (or  similar)  Do Not stick any thing down your splint or cast such as pencils, money, or hangers to try and scratch yourself with.  If you feel itchy take benadryl as prescribed on the bottle for itching  IF YOU ARE IN A CAM BOOT (BLACK BOOT)  You may remove boot periodically. Perform daily dressing changes as noted below.  Wash the liner of the boot regularly and wear a sock when wearing the boot. It is recommended that you sleep in the boot until told otherwise  CALL THE OFFICE WITH ANY QUESTIONS OR CONCERNS: [phone]

## 2017-03-26 NOTE — Progress Notes (Signed)
Patient will discharge to [facility] Anticipated discharge date: 9/27 Family notified: [facility] by Sharin Mons- scheduled for 4pm  CSW signing off.  Burna Sis, LCSW Clinical Social Worker [phone]

## 2017-03-26 NOTE — Clinical Social Work Placement (Signed)
   CLINICAL SOCIAL WORK PLACEMENT  NOTE  Date:  03/26/2017  Patient Details  Name: Crystal [name] MRN: [id number] Date of Birth: 1951/02/09  Clinical Social Work is seeking post-discharge placement for this patient at the Skilled  Nursing Facility level of care (*CSW will initial, date and re-position this form in  chart as items are completed):  Yes   Patient/family provided with [place] Clinical Social Work Department's list of facilities offering this level of care within the geographic area requested by the patient (or if unable, by the patient's family).  Yes   Patient/family informed of their freedom to choose among providers that offer the needed level of care, that participate in Medicare, Medicaid or managed care program needed by the patient, have an available bed and are willing to accept the patient.  Yes   Patient/family informed of 's ownership interest in [facility] and [facility], as well as of the fact that they are under no obligation to receive care at these facilities.  PASRR submitted to EDS on 03/25/17     PASRR number received on 03/25/17     Existing PASRR number confirmed on       FL2 transmitted to all facilities in geographic area requested by pt/family on 03/25/17     FL2 transmitted to all facilities within larger geographic area on       Patient informed that his/her managed care company has contracts with or will negotiate with certain facilities, including the following:        Yes   Patient/family informed of bed offers received.  Patient chooses bed at [facility]     Physician recommends and patient chooses bed at      Patient to be transferred to [facility] on 03/26/17.  Patient to be transferred to facility by ptar     Patient family notified on 03/26/17 of transfer.  Name of family member notified:  Laurence Aly     PHYSICIAN Please sign FL2     Additional Comment:     _______________________________________________ Burna Sis, LCSW 03/26/2017, 1:23 PM

## 2017-03-27 ENCOUNTER — Non-Acute Institutional Stay (SKILLED_NURSING_FACILITY): Payer: Medicare Other | Admitting: Adult Health

## 2017-03-27 ENCOUNTER — Encounter: Payer: Self-pay | Admitting: Adult Health

## 2017-03-27 DIAGNOSIS — G2581 Restless legs syndrome: Secondary | ICD-10-CM

## 2017-03-27 DIAGNOSIS — G4733 Obstructive sleep apnea (adult) (pediatric): Secondary | ICD-10-CM | POA: Diagnosis not present

## 2017-03-27 DIAGNOSIS — J301 Allergic rhinitis due to pollen: Secondary | ICD-10-CM

## 2017-03-27 DIAGNOSIS — M797 Fibromyalgia: Secondary | ICD-10-CM

## 2017-03-27 DIAGNOSIS — I10 Essential (primary) hypertension: Secondary | ICD-10-CM

## 2017-03-27 DIAGNOSIS — E039 Hypothyroidism, unspecified: Secondary | ICD-10-CM

## 2017-03-27 DIAGNOSIS — N3281 Overactive bladder: Secondary | ICD-10-CM | POA: Diagnosis not present

## 2017-03-27 DIAGNOSIS — F3341 Major depressive disorder, recurrent, in partial remission: Secondary | ICD-10-CM | POA: Diagnosis not present

## 2017-03-27 DIAGNOSIS — S728X2S Other fracture of left femur, sequela: Secondary | ICD-10-CM

## 2017-03-27 LAB — CALCITRIOL (1,25 DI-OH VIT D): Vit D, 1,25-Dihydroxy: 82.9 pg/mL — ABNORMAL HIGH (ref 19.9–79.3)

## 2017-03-27 LAB — PTH, INTACT AND CALCIUM
Calcium, Total (PTH): 7.8 mg/dL — ABNORMAL LOW (ref 8.7–10.3)
PTH: 44 pg/mL (ref 15–65)

## 2017-03-27 LAB — VITAMIN D 25 HYDROXY (VIT D DEFICIENCY, FRACTURES): Vit D, 25-Hydroxy: 65.8 ng/mL (ref 30.0–100.0)

## 2017-03-27 NOTE — Progress Notes (Signed)
DATE:  03/27/2017   MRN:  [id number]  BIRTHDAY: [birth year]  Facility:  Nursing Home Location:  [facility] Room Number: 295-J  LEVEL OF CARE:  SNF [phone])  Contact Information    Name [place] Spouse [phone]       Code Status History    Date Active Date Inactive Code Status Order ID Comments User Context   03/23/2017  8:06 PM 03/26/2017 10:45 PM Full Code [id number]  Norval Morton, MD ED   06/24/2015  7:50 PM 06/27/2015  2:57 PM Full Code [id number]  Vianne Bulls, MD ED   06/14/2015  2:26 PM 06/15/2015  6:32 PM Full Code [id number]  Erline Levine, MD Inpatient       Chief Complaint  Patient presents with  . Hospitalization Follow-up    Hospital followup    HISTORY OF PRESENT ILLNESS:  This is a 89-YO female seen for hospital followup.  She is a short-term rehabilitation resident of [facility].  She was admitted to [facility] on 03/26/2017 following an admission at [facility] 03/23/17-03/26/17 for periprosthetic supracondylar fracture of left femur, S/P ORIF on 03/24/17.  She has a PMH of HTN, fibromyalgia, depression, OSA, and Crohn's disease. She was seen in her room and was requesting for pain medication. Charge nurse showed prescription for Oxycodone PRN  prescriptions.  She has been been admitted for a short-term rehabilitation.  PAST MEDICAL HISTORY:  Past Medical History:  Diagnosis Date  . Arthritis   . ASCVD (arteriosclerotic cardiovascular disease)   . Depression   . Fibromyalgia   . Gait disorder 10/26/2012  . GERD (gastroesophageal reflux disease)   . GI bleed   . Hyperlipidemia   . Hypertension   . Hypothyroid   . Periprosthetic supracondylar fracture of femur, left  03/23/2017  . Restless leg syndrome   . Rhinitis 06/25/2010  . Sleep apnea   . TIA (transient ischemic attack)    3        . Vitamin D deficiency       CURRENT MEDICATIONS: Reviewed  Patient's Medications  New Prescriptions   No medications on file  Previous Medications   ACYCLOVIR (ZOVIRAX) 800 MG TABLET    TAKE ONE TABLET BY MOUTH THREE TIMES DAILY WITH MEALS AND 2 NIGHTLY AT BEDTIME.   ASPIRIN EC 81 MG TABLET    Take 81 mg by mouth daily.   BENZONATATE (TESSALON) 200 MG CAPSULE    Take 1 perle 3 x / day to prevent cough   BISOPROLOL-HYDROCHLOROTHIAZIDE (ZIAC) 10-6.25 MG TABLET    Take 1 tablet by mouth daily.   BUPROPION (WELLBUTRIN XL) 300 MG 24 HR TABLET    TAKE 1 TABLET BY MOUTH EVERY MORNING FOR MOOD AND FOCUS ATTENTION   CYCLOBENZAPRINE (FLEXERIL) 5 MG TABLET    TAKE 1 TABLET BY MOUTH 2 TIMES DAILY AS NEEDED FOR MUSCLE SPASMS.   DICLOFENAC SODIUM (VOLTAREN) 1 % GEL    Apply 2 g topically 2 (two) times daily.   DULOXETINE (CYMBALTA) 60 MG CAPSULE    TAKE 1 CAPSULE BY MOUTH DAILY FOR PAIN.   ENOXAPARIN (LOVENOX) 40 MG/0.4ML INJECTION    Inject 0.4 mLs (40 mg total) into the skin daily.   FENOFIBRATE MICRONIZED (LOFIBRA) 134 MG CAPSULE    TAKE 1 CAPSULE BY MOUTH DAILY BEFORE BREAKFAST   FERROUS SULFATE 325 (65 FE) MG TABLET    Take 325 mg by  mouth daily with breakfast.   FLUTICASONE (FLONASE) 50 MCG/ACT NASAL SPRAY    USE TWO SPRAYS IN EACH NOSTRIL DAILY AS NEEDED   GABAPENTIN (NEURONTIN) 300 MG CAPSULE    TAKE ONE CAPSULE 3 TIMES A DAY   LEVOTHYROXINE (SYNTHROID, LEVOTHROID) 100 MCG TABLET    Take 100 mcg by mouth daily before breakfast.   LINACLOTIDE (LINZESS) 290 MCG CAPS CAPSULE    Take 290 mcg by mouth daily.   MECLIZINE (ANTIVERT) 25 MG TABLET    Take 1 tablet (25 mg total) by mouth 3 (three) times daily as needed for dizziness.   OXYBUTYNIN (DITROPAN) 5 MG TABLET    TAKE 1 TABLET BY MOUTH 2 TIMES DAILY   OXYCODONE (OXY-IR) 5 MG CAPSULE    Take 5 mg by mouth every 8 (eight) hours as needed.   POLYETHYLENE GLYCOL (MIRALAX / GLYCOLAX) PACKET    Take 17 g by mouth daily as needed for mild constipation.   RANITIDINE (ZANTAC)  300 MG TABLET    Take 1 tablet (300 mg total) by mouth at bedtime.   ROPINIROLE (REQUIP) 2 MG TABLET    TAKE 1 TABLET 3 TIMES A DAY FOR RESTLESS LEG SYNDROME   VALSARTAN (DIOVAN) 40 MG TABLET    Take 40 mg by mouth daily.  Modified Medications   No medications on file  Discontinued Medications   IRON PO    Take 25 mg by mouth daily.    OVER THE COUNTER MEDICATION    Take 1 capsule by mouth daily.    OVER THE COUNTER MEDICATION    Take 1 capsule by mouth daily.    OXYCODONE (ROXICODONE) 5 MG IMMEDIATE RELEASE TABLET    Take 1-2 tablets (5-10 mg total) by mouth every 6 (six) hours as needed for breakthrough pain (take between percocet for breakthrough pain only).   OXYCODONE-ACETAMINOPHEN (PERCOCET/ROXICET) 5-325 MG TABLET    Take 1-2 tablets by mouth every 6 (six) hours as needed for moderate pain or severe pain.   PHENTERMINE (ADIPEX-P) 37.5 MG TABLET    TAKE 1 TABLET BY MOUTH EVERY DAY BEFORE BREAKFAST   VALSARTAN (DIOVAN) 80 MG TABLET    1/2-1 tablet dailyt for BP, stop losartan   VITAMIN D, ERGOCALCIFEROL, (DRISDOL) 50000 UNITS CAPS CAPSULE    Take 1 capsule (50,000 Units total) by mouth daily. Take on tuesday, Wednesday,thursday     Allergies  Allergen Reactions  . Atorvastatin Other (See Comments)    Extreme pain  . Codeine Other (See Comments)    Headache  . Pravastatin Other (See Comments)    Exreme pain  . Statins Other (See Comments)    Extreme pain  . Amitriptyline Other (See Comments)    Unspecified  . Fish Oil Nausea Only  . Nuvigil [Armodafinil] Other (See Comments)    Unspecified  . Erythromycin Other (See Comments)    Reaction unspecified  . Erythromycin Base Rash  . Flax Seed [Bio-Flax] Rash    Linseed  . Hydrocodone-Acetaminophen Rash  . Morphine Nausea And Vomiting and Rash  . Nsaids Nausea Only  . Sertraline Rash  . Sinemet [Carbidopa W-Levodopa] Rash  . Sulfamethoxazole Rash     REVIEW OF SYSTEMS:  GENERAL: no change in appetite, no fatigue, no  weight changes, no fever, chills or weakness MOUTH and THROAT: Denies oral discomfort, gingival pain or bleeding, pain from teeth or hoarseness   RESPIRATORY: no cough, SOB, DOE, wheezing, hemoptysis CARDIAC: no chest pain, edema or palpitations GI: no abdominal pain, diarrhea,  constipation, heart burn, nausea or vomiting GU: Denies dysuria, frequency, hematuria, incontinence, or discharge PSYCHIATRIC: Denies feeling of depression or anxiety. No report of hallucinations, insomnia, paranoia, or agitation    PHYSICAL EXAMINATION  GENERAL APPEARANCE: Well nourished. In no acute distress. Normal body habitus SKIN:  Left femur surgical incision has sutures,  is dry, no eryhthema MOUTH and THROAT: Lips are without lesions. Oral mucosa is moist and without lesions.  RESPIRATORY: breathing is even & unlabored, BS CTAB CARDIAC: RRR, no murmur,no extra heart sounds, no edema GI: abdomen soft, normal BS, no masses, no tenderness, no hepatomegaly, no splenomegaly EXTREMITIES: Able to move X 4 extremities PSYCHIATRIC: Alert and oriented X 3. Affect and behavior are appropriate   LABS/RADIOLOGY: Labs reviewed: Basic Metabolic Panel:  Recent Labs  09/08/16 1023 01/06/17 1538  03/24/17 0501 03/24/17 1806 03/25/17 0408 03/26/17 0354  NA 141 143  < > 145  --  141 139  K 4.1 4.5  < > 3.8  --  3.7 3.5  CL 100 103  < > 106  --  104 103  CO2 32* 25  < > 31  --  31 30  GLUCOSE 121* 112*  < > 126*  --  116* 104*  BUN 18 20  < > 26*  --  15 13  CREATININE 0.89 1.01*  < > 0.85 0.78 0.74 0.66  CALCIUM 9.7 10.0  < > 9.0  --  7.9* 7.8*  MG 2.0 2.1  --   --   --   --  2.1  < > = values in this interval not displayed. Liver Function Tests:  Recent Labs  05/19/16 1153 09/08/16 1023 01/06/17 1538  AST 17 16 14   ALT 12 12 9   ALKPHOS 40 49 45  BILITOT 0.5 0.4 0.4  PROT 6.9 7.2 6.8  ALBUMIN 4.3 4.4 4.2   CBC:  Recent Labs  01/06/17 1538 03/23/17 1706 03/24/17 0501  03/25/17 0408  03/25/17 1204 03/26/17 0354  WBC 6.9 13.1* 9.0  < > 10.3 11.7* 8.0  NEUTROABS 4,554 9.5* 5.7  --   --   --   --   HGB 13.2 11.5* 11.1*  < > 8.0* 8.6* 7.7*  HCT 40.7 34.3* 34.4*  < > 25.2* 27.8* 24.8*  MCV 88.5 87.7 89.4  < > 89.4 90.3 90.5  PLT 283 295 269  < > 225 254 212  < > = values in this interval not displayed. Lipid Panel:  Recent Labs  05/19/16 1153 09/08/16 1023 01/06/17 1538  HDL 41* 38* 36*    Dg Chest 2 View  Result Date: 03/23/2017 CLINICAL DATA:  Femur fracture EXAM: CHEST  2 VIEW COMPARISON:  None. FINDINGS: Stable asymmetric elevation right hemidiaphragm. The lungs are clear without focal pneumonia, edema, pneumothorax or pleural effusion. The cardiopericardial silhouette is within normal limits for size. The visualized bony structures of the thorax are intact. Incomplete visualization lower cervical fusion. Telemetry leads overlie the chest. IMPRESSION: No active cardiopulmonary disease. Electronically Signed   By: Misty Stanley M.D.   On: 03/23/2017 17:40   Dg Knee 1-2 Views Left  Result Date: 03/23/2017 CLINICAL DATA:  Fall, left leg pain. EXAM: LEFT KNEE - 1-2 VIEW COMPARISON:  None. FINDINGS: AP and lateral views of the left knee are provided. There is a displaced fracture within the distal left femur, centered at the distal femoral metadiaphysis, with approximately 2 cm diastasis at the upper margin of the fracture. The femoral intramedullary stem component of  the left knee arthroplasty appears to extend through the fracture site, but appears otherwise intact and appropriately positioned. IMPRESSION: 1. Displaced fracture within the distal left femur, acute appearing, centered at the distal metadiaphysis, with approximately 2 cm diastasis at the upper margin of the fracture. 2. Femoral intramedullary stem component of the left knee arthroplasty hardware appears to extend into the fracture site, but appears otherwise intact and appropriately positioned. Inferior  portion of the arthroplasty hardware is incompletely imaged but appears intact and appropriately positioned. Electronically Signed   By: Franki Cabot M.D.   On: 03/23/2017 17:47   Dg Knee Left Port  Result Date: 03/24/2017 CLINICAL DATA:  Initial evaluation status post surgery for distal left femoral fracture. EXAM: PORTABLE LEFT KNEE - 1-2 VIEW COMPARISON:  None. FINDINGS: Postoperative changes from interval ORIF of previously identified distal left femoral periprosthetic fracture. Partially visualized fracture now in anatomic alignment. There has been interval placement of lateral malleoli plate screw fixation at the distal left femur. Visualized hardware intact without complication. Tibial hardware stable in appearance. No new fracture. Postoperative soft tissue swelling and emphysema noted. IMPRESSION: Postoperative changes from interval ORIF of distal left femoral periprosthetic fracture without complication. Electronically Signed   By: Jeannine Boga M.D.   On: 03/24/2017 20:41   Dg C-arm Gt 120 Min  Result Date: 03/24/2017 CLINICAL DATA:  ORIF left femur. EXAM: LEFT FEMUR 2 VIEWS; DG C-ARM GT 120 MIN COMPARISON:  Bone scan 12/04/2016. FINDINGS: Postsurgical changes of the left femur noted with plate and screw fixation. Near alignment were noted. Total left knee replacement. Hardware intact. IMPRESSION: ORIF left femur with near anatomic alignment where visualized. Hardware intact . Electronically Signed   By: Marcello Moores  Register   On: 03/24/2017 14:43   Dg Hip Unilat With Pelvis 2-3 Views Left  Result Date: 03/23/2017 CLINICAL DATA:  Fall, left leg pain EXAM: DG HIP (WITH OR WITHOUT PELVIS) 2-3V LEFT COMPARISON:  None. FINDINGS: Single-view of the pelvis and two views of the left hip are provided. Osseous alignment is normal. No fracture line or displaced fracture fragment seen. Fusion hardware within the lower lumbar spine is incompletely imaged. Soft tissues about the left hip are  unremarkable. IMPRESSION: No acute findings at the left hip. Electronically Signed   By: Franki Cabot M.D.   On: 03/23/2017 17:44   Dg Femur 1v Left  Result Date: 03/23/2017 CLINICAL DATA:  Fall, leg pain. EXAM: LEFT FEMUR 1 VIEW COMPARISON:  None. FINDINGS: Left knee arthroplasty hardware in place, incompletely imaged, femoral intramedullary stem component appearing intact and appropriately positioned. There is a displaced fracture of the distal left femur, of uncertain age, presumably acute based on accompanying plain film of the left knee. Upper femur appears intact and normally aligned. Soft tissues about the left femur are unremarkable. IMPRESSION: 1. Displaced fracture of the distal left femur, acute appearance based on accompanying plain film of the left knee. 2. Femoral component of the left knee arthroplasty hardware appears intact and appropriately positioned on this exam, extends to the fracture line on accompanying plain film of the knee. Electronically Signed   By: Franki Cabot M.D.   On: 03/23/2017 17:44   Dg Femur Min 2 Views Left  Result Date: 03/24/2017 CLINICAL DATA:  ORIF left femur. EXAM: LEFT FEMUR 2 VIEWS; DG C-ARM GT 120 MIN COMPARISON:  Bone scan 12/04/2016. FINDINGS: Postsurgical changes of the left femur noted with plate and screw fixation. Near alignment were noted. Total left knee replacement. Hardware intact.  IMPRESSION: ORIF left femur with near anatomic alignment where visualized. Hardware intact . Electronically Signed   By: Marcello Moores  Register   On: 03/24/2017 14:43   Dg Femur Port Min 2 Views Left  Result Date: 03/24/2017 CLINICAL DATA:  Status post surgery for close fracture of left distal femur. EXAM: LEFT FEMUR PORTABLE 2 VIEWS COMPARISON:  None. FINDINGS: The patient has undergone interval open reduction with screw and plate fixation of the distal femur periprosthetic fracture. Additionally, 2 cerclage wires were placed around the fracture site. The hardware  components and fracture fragments appear to be in anatomic alignment. IMPRESSION: 1. Status post ORIF of periprosthetic distal femur fracture. Electronically Signed   By: Kerby Moors M.D.   On: 03/24/2017 20:42    ASSESSMENT/PLAN:  1. Other closed fracture of left femur, unspecified portion of femur, sequela - for rehabilitation with PT and OT, for therapeutic strengthening exercises; start oxycodone 5 mg 1 tab by mouth every 8 hours when necessary for pain, cyclobenzaprine 5 mg 1 tab twice a day when necessary for muscle spasm, follow-up with orthopedic surgeon,  Dr. Marcelino Scot, in 1 week  2. OAB (overactive bladder) - continue oxybutynin 5 mg 1 tab twice a day  3. Essential hypertension - well controlled, continue Ziac 10-6.25 mg 1 tab daily and valsartan 40 mg 1 tab daily, check BMP  4. Obstructive sleep apnea - continue CPAP at at bedtime   5. Allergic rhinitis due to pollen, unspecified seasonality - stable, continue fluticasone 50 g 2 sprays into each nostril daily  6. RESTLESS LEG SYNDROME - continue ropinirole 2 mg 1 tab 3 times daily   7. Hypothyroidism, unspecified type - continue levothyroxine 100 g 1 tab daily Lab Results  Component Value Date   TSH 0.445 03/26/2017    8. Fibromyalgia - Continue Cymbalta 60 mg 1 capsule daily and gabapentin 300 mg 1 capsule 3 times daily  9. Depression - continue bupropion XL 300 mg 1 tab daily   10. Constipation -  Discontinue Linzess, start senna S2 tabs by mouth twice a day and MiraLAX 17 g daily  12. Anemia, acute blood loss -  Start FeSO4  325 mg 1 tab BID, check CBC Lab Results  Component Value Date   HGB 7.7 (L) 03/26/2017      Goals of care:  Short-term rehabilitation    Monina C. [place] - NP    [employer] [phone]

## 2017-03-28 LAB — CBC AND DIFFERENTIAL
HCT: 29 — AB (ref 36–46)
Hemoglobin: 9.4 — AB (ref 12.0–16.0)
Neutrophils Absolute: 8
Platelets: 306 (ref 150–399)
WBC: 11.1

## 2017-03-28 LAB — BASIC METABOLIC PANEL
BUN: 14 (ref 4–21)
Creatinine: 0.6 (ref 0.5–1.1)
Glucose: 91
Potassium: 4.9 (ref 3.4–5.3)
Sodium: 142 (ref 137–147)

## 2017-03-31 ENCOUNTER — Encounter: Payer: Self-pay | Admitting: Internal Medicine

## 2017-03-31 ENCOUNTER — Non-Acute Institutional Stay (SKILLED_NURSING_FACILITY): Payer: Medicare Other | Admitting: Internal Medicine

## 2017-03-31 DIAGNOSIS — M978XXD Periprosthetic fracture around other internal prosthetic joint, subsequent encounter: Secondary | ICD-10-CM

## 2017-03-31 DIAGNOSIS — G4733 Obstructive sleep apnea (adult) (pediatric): Secondary | ICD-10-CM | POA: Diagnosis not present

## 2017-03-31 DIAGNOSIS — Z96659 Presence of unspecified artificial knee joint: Secondary | ICD-10-CM | POA: Diagnosis not present

## 2017-03-31 DIAGNOSIS — I10 Essential (primary) hypertension: Secondary | ICD-10-CM

## 2017-03-31 NOTE — Assessment & Plan Note (Signed)
Contact Dr. Carlean Jews office for follow-up

## 2017-03-31 NOTE — Assessment & Plan Note (Signed)
03/31/17 valsartan will be reduced to 20 mg daily Ziac dose will be adjusted if hypotension persists

## 2017-03-31 NOTE — Assessment & Plan Note (Signed)
CPAP ordered

## 2017-03-31 NOTE — Patient Instructions (Signed)
See assessment and plan under each diagnosis in the problem list and acutely for this visit 

## 2017-03-31 NOTE — Progress Notes (Signed)
NURSING HOME LOCATION:  [facility] ROOM NUMBER:  309-A  CODE STATUS:  Full Code  PCP:  [name], MD  [address] [place] [address]   This is a comprehensive admission note to [facility] performed on this date less than 30 days from date of admission. Included are preadmission medical/surgical history;reconciled medication list; family history; social history and comprehensive review of systems.  Corrections and additions to the records were documented . Comprehensive physical exam was also performed. Additionally a clinical summary was entered for each active diagnosis pertinent to this admission in the Problem List to enhance continuity of care.  HPI: The patient was hospitalized 9/24-9/27/18 for surgical correction 9/25 by Dr Carola Frost of a periprosthetic supracondylar  left femur fracture sustained in a mechanical fall. She had slipped in water and fallen in the kitchen. There was no cardiac or neurologic prodrome. His operative note was reviewed. He describes "open reduction and internal fixation left supracondylar femur fracture with bone grafting. Hospitalization was complicated by urinary tract infection for which she received 3 days of Rocephin. Additionally acute blood loss anemia was present. At discharge hemoglobin 9.4/hematocrit 29 ;hemoglobin/hematocrit was 11.5 and 34.3 preop She was transferred to SNF for PT/OT with nonweightbearing with unrestricted range of motion of the hip, knee, and ankle. Lovenox prophylaxis was prescribed. She did not return home as she lives in a 3 story abode without bathroom or bedroom on the ground floor. Her husband is infirm. She also had a mechanical fall in May injuring her left knee. She been wearing a brace. This is being evaluated by Dr Despina Hick.  Past medical and surgical history: Includes hypertension, fibromyalgia, depression, obstructive sleep apnea, hypothyroidism, vitamin D. deficiency,  dyslipidemia and restless leg syndrome , TIAs,Bell's palsyand Crohn's disease. Pertinent surgeries include abdominal hysterectomy and anterior cervical decompression/discectomy infusion at 4 levels. She's had multiple orthopedic procedures and surgery.  Social history: Rare alcohol, never smoked  Family history: Reviewed  Review of systems: Her major issue is the left thigh pain postoperatively. She's had some pain in the left heel due to positioning to relieve pain in the thigh. She does describe intermittent fever and chills which she believes relates to her fibromyalgia. She describes dry eyes in the context of previous Bell's palsy. She is concerned as BP has been "low" She wants to have CPAP ordered @ SNF.  Constitutional: No significant weight change, fatigue  Eyes: No redness, discharge, pain, vision change ENT/mouth: No nasal congestion,  purulent discharge, earache,change in hearing ,sore throat  Cardiovascular: No chest pain, palpitations,paroxysmal nocturnal dyspnea, claudication, edema  Respiratory: No cough, sputum production,hemoptysis, DOE , significant snoring,apnea (note: on home CPAP)  Gastrointestinal: No heartburn,dysphagia,abdominal pain, nausea / vomiting,rectal bleeding, melena,change in bowels Genitourinary: No dysuria,hematuria, pyuria,  incontinence, nocturia Dermatologic: No rash, pruritus, change in appearance of skin Neurologic: No dizziness,headache,syncope, seizures, numbness , tingling Psychiatric: No significant anxiety , depression, insomnia, anorexia Endocrine: No change in hair/skin/ nails, excessive thirst, excessive hunger, excessive urination  Hematologic/lymphatic: No significant bruising, lymphadenopathy,abnormal bleeding Allergy/immunology: No itchy/ watery eyes, significant sneezing, urticaria, angioedema  Physical exam:  Pertinent or positive findings: She appears younger than her stated age. There is facial asymmetry due to the Bell's palsy  residua.Slight ptosis on the right. Heart sounds are somewhat distant. Abdomen slightly protuberant. She has a tattoo over the right ventral wrist. She has a SQ cystic lesion over the right lateral posterior calf. She has vertical operative scars over the knees. Effusion  is present in the left knee. Pedal pulses are slightly decreased. She is wearing a foam support bootie on the right foot.  General appearance:Adequately nourished; no acute distress , increased work of breathing is present.   Lymphatic: No lymphadenopathy about the head, neck, axilla . Eyes: No conjunctival inflammation or lid edema is present. There is no scleral icterus. Ears:  External ear exam shows no significant lesions or deformities.   Nose:  External nasal examination shows no deformity or inflammation. Nasal mucosa are pink and moist without lesions ,exudates Oral exam: lips and gums are healthy appearing.There is no oropharyngeal erythema or exudate . Neck:  No thyromegaly, masses, tenderness noted.    Heart:  Normal rate and regular rhythm. S1 and S2 normal without gallop, murmur, click, rub .  Lungs:Chest clear to auscultation without wheezes, rhonchi,rales , rubs. Abdomen:Bowel sounds are normal. Abdomen is soft and nontender with no organomegaly, hernias,masses. GU: deferred  Extremities:  No cyanosis, clubbing,edema  Neurologic exam : Strength equal  in upper extremities Balance,Rhomberg,finger to nose testing could not be completed due to clinical state Skin: Warm & dry w/o tenting. No significant lesions or rash.  See clinical summary under each active problem in the Problem List with associated updated therapeutic plan

## 2017-04-02 ENCOUNTER — Other Ambulatory Visit: Payer: Self-pay | Admitting: Internal Medicine

## 2017-04-02 DIAGNOSIS — M797 Fibromyalgia: Secondary | ICD-10-CM

## 2017-04-09 ENCOUNTER — Non-Acute Institutional Stay (SKILLED_NURSING_FACILITY): Payer: Medicare Other | Admitting: Internal Medicine

## 2017-04-09 ENCOUNTER — Encounter: Payer: Self-pay | Admitting: Internal Medicine

## 2017-04-09 DIAGNOSIS — Z79899 Other long term (current) drug therapy: Secondary | ICD-10-CM

## 2017-04-09 DIAGNOSIS — M978XXD Periprosthetic fracture around other internal prosthetic joint, subsequent encounter: Secondary | ICD-10-CM

## 2017-04-09 DIAGNOSIS — Z9189 Other specified personal risk factors, not elsewhere classified: Secondary | ICD-10-CM | POA: Diagnosis not present

## 2017-04-09 DIAGNOSIS — Z96659 Presence of unspecified artificial knee joint: Secondary | ICD-10-CM | POA: Diagnosis not present

## 2017-04-09 NOTE — Assessment & Plan Note (Addendum)
I discussed my concerns about opiod risk based on comorbidities with Mr Crystal [name], [place]  Risk also discussed with patient

## 2017-04-09 NOTE — Progress Notes (Signed)
    NURSING HOME LOCATION:  [facility] ROOM NUMBER:  309-A  CODE STATUS:  Full Code  PCP:   Crystal Cowboy, MD  [address] [place] [address]   This is a nursing facility follow up for specific acute issue of postoperative pain.  Interim medical record and care since last [facility] visit was updated with review of diagnostic studies and change in clinical status since last visit were documented.  HPI: Crystal Morita, PA saw the patient yesterday. His assessment and recommendations were reviewed and entered in the problem list. I called and discussed the situation with him. He states she said she was having excruciating pain. According to her nurse she has been receiving her opioid on a regular schedule and not requesting any pain medicine for breakthrough. Her opioid-related risk was calculated to be 30-50 percent depending on her opioid dose. Additionally she is on polypharmacy with multiple Beers agents.  Review of systems: She states that she's been taking the muscle relaxants but they're not that effective. She states that she is interested deprescribing in relationship to the polypharmacy.  Physical exam:  Pertinent or positive findings: There is facial asymmetry with ptosis on the right and asymmetry of the nasolabial folds. An S4 type cadence is suggested with a faint systolic murmur at the base. Pedal pulses are decreased. She has significant osteoarthritic changes in the DIP joints.  General appearance:Adequately nourished; no acute distress , increased work of breathing is present. Appears younger than stated age Lymphatic: No lymphadenopathy about the head, neck, axilla . Eyes: No conjunctival inflammation or lid edema is present. There is no scleral icterus. Neck:  No thyromegaly, masses, tenderness noted.    Heart:  Normal rate and regular rhythm without click, rub .  Lungs:Chest clear to auscultation without wheezes, rhonchi,rales ,  rubs. Abdomen:Bowel sounds are normal. Abdomen is soft and nontender with no organomegaly, hernias,masses. GU: deferred  Extremities:  No cyanosis, clubbing,edema  Neurologic exam : Cn 2-7 intact Strength equal  in upper  extremities Balance,Rhomberg,finger to nose testing could not be completed due to clinical state Skin: Warm & dry w/o tenting. No significant lesions or rash.  See summary under each active problem in the Problem List with associated updated therapeutic plan

## 2017-04-09 NOTE — Assessment & Plan Note (Signed)
Beers list discussed with patient, she plans to visit that site

## 2017-04-09 NOTE — Patient Instructions (Addendum)
See assessment and plan under each diagnosis in the problem list and acutely for this visit Total time 27 minutes; greater than 50% of the visit spent counseling patient  for problems & risks  addressed at this encounter

## 2017-04-10 NOTE — Assessment & Plan Note (Signed)
Copy of article sent to Mr Crystal [name] Risk shared with patient to provide informed consent if she wants to increase opiods, which I advise against

## 2017-04-15 ENCOUNTER — Ambulatory Visit: Payer: Self-pay | Admitting: Internal Medicine

## 2017-04-24 ENCOUNTER — Other Ambulatory Visit: Payer: Self-pay | Admitting: Internal Medicine

## 2017-04-28 ENCOUNTER — Ambulatory Visit (INDEPENDENT_AMBULATORY_CARE_PROVIDER_SITE_OTHER): Payer: Medicare Other | Admitting: Internal Medicine

## 2017-04-28 VITALS — BP 112/78 | HR 80 | Temp 97.5°F | Resp 16

## 2017-04-28 DIAGNOSIS — K219 Gastro-esophageal reflux disease without esophagitis: Secondary | ICD-10-CM

## 2017-04-28 DIAGNOSIS — Z79899 Other long term (current) drug therapy: Secondary | ICD-10-CM

## 2017-04-28 DIAGNOSIS — S72402S Unspecified fracture of lower end of left femur, sequela: Secondary | ICD-10-CM

## 2017-04-28 DIAGNOSIS — D649 Anemia, unspecified: Secondary | ICD-10-CM | POA: Diagnosis not present

## 2017-04-28 DIAGNOSIS — I10 Essential (primary) hypertension: Secondary | ICD-10-CM | POA: Diagnosis not present

## 2017-04-28 DIAGNOSIS — N390 Urinary tract infection, site not specified: Secondary | ICD-10-CM

## 2017-04-28 NOTE — Progress Notes (Signed)
Crystal [name]     This very nice 66 y.o. [name] was admitted  9/24-28 for a Left periprosthetic supracondylar fx and underwent ORIF on 9/28 per Dr Charlann Boxer. Then she was transferred to [facility] for non weight bearing OT/LPT and was d/c'd home 10/26 to continue with Home PT.  She  presents now for post hospital follow up and surprising has a good attitude.  Patient was contacted post discharge by office staff to assure stability and schedule f/u.      Hospitalization discharge instructions and medications are reconciled with the patient.      Patient is also followed with a multiplicity of Medical co-morbidities including Hypertension, Hyperlipidemia, OSA/CPAP/RLS, Pre-Diabetes, Hypothyroidism, Crohn's Dz,  Fibromyalgia,  DDD and Vitamin D Deficiency. Her GERD is controlled with her meds. She has a R hemifacial paresis consequent e of a remote R Bell's palsy circa 2000.      Patient is treated for HTN (1996) & BP has been controlled at home. Today's BP is at goal - 112/78. Patient has had no complaints of any cardiac type chest pain, palpitations, dyspnea/orthopnea/PND, dizziness, claudication, or dependent edema.     Hyperlipidemia is controlled with diet & meds. Patient denies myalgias or other med SE's. Last Lipids were at goal albeit elevated Trig's:  Lab Results  Component Value Date   CHOL 155 01/06/2017   HDL 36 (L) 01/06/2017   LDLCALC 84 01/06/2017   TRIG 177 (H) 01/06/2017   CHOLHDL 4.3 01/06/2017      Also, the patient has history of T2_NIDDM (A1c 6.6% in 2012 and A1c 6.8% in 2013) and has had no symptoms of reactive hypoglycemia, diabetic polys, paresthesias or visual blurring.  She has been attempting dietary control with good success. Last A1c was near goal: Lab Results  Component Value Date   HGBA1C 5.9 (H) 01/06/2017      Patient has been on Thyroid Replacement circa 2000. Further, the patient also has history of Vitamin D Deficiency ("33" / 2008)  and supplements  vitamin D without any suspected side-effects. Last vitamin D was at goal: Lab Results  Component Value Date   VD25OH 65.8 03/26/2017   Current Outpatient Prescriptions on File Prior to Visit  Medication Sig  . aspirin EC 81 MG tablet Take 81 mg by mouth daily.  . bisoprolol-hydrochlorothiazide (ZIAC) 10-6.25 MG tablet Take 1 tablet by mouth daily.  [name] buPROPion (WELLBUTRIN XL) 300 MG 24 hr tablet TAKE 1 TABLET BY MOUTH EVERY MORNING FOR MOOD AND FOCUS ATTENTION  . Cholecalciferol (VITAMIN D3) 50000 units CAPS Take 1 capsule by mouth once a week.  . cyclobenzaprine (FLEXERIL) 5 MG tablet TAKE 1 TABLET BY MOUTH 2 TIMES DAILY AS NEEDED FOR MUSCLE SPASMS.  [name] diclofenac sodium (VOLTAREN) 1 % GEL Apply 2 g topically 2 (two) times daily.  . DULoxetine (CYMBALTA) 60 MG capsule TAKE 1 CAPSULE BY MOUTH 2 TIMES A DAY FOR PAIN.  . fenofibrate micronized (LOFIBRA) 134 MG capsule TAKE 1 CAPSULE BY MOUTH DAILY BEFORE BREAKFAST  . ferrous sulfate 325 (65 FE) MG tablet Take 325 mg by mouth 2 (two) times daily with a meal.   . fluticasone (FLONASE) 50 MCG/ACT nasal spray USE TWO SPRAYS IN EACH NOSTRIL DAILY AS NEEDED  . gabapentin (NEURONTIN) 300 MG capsule TAKE ONE CAPSULE 3 TIMES A DAY  . levothyroxine (SYNTHROID, LEVOTHROID) 100 MCG tablet Take 100 mcg by mouth daily before breakfast.  . meclizine (ANTIVERT) 25 MG tablet Take 1 tablet (25 mg  total) by mouth 3 (three) times daily as needed for dizziness.  [name]. oxybutynin (DITROPAN) 5 MG tablet TAKE 1 TABLET TWICE A DAY  . oxycodone (OXY-IR) 5 MG capsule Take 5 mg by mouth every 8 (eight) hours as needed.  . polyethylene glycol (MIRALAX / GLYCOLAX) packet Take 17 g by mouth daily as needed for mild constipation.  . ranitidine (ZANTAC) 300 MG tablet Take 1 tablet (300 mg total) by mouth at bedtime.  [name]. rOPINIRole (REQUIP) 2 MG tablet TAKE 1 TABLET 3 TIMES A DAY FOR RESTLESS LEG SYNDROME  . sennosides-docusate sodium (SENOKOT-S) 8.6-50 MG tablet Take 2 tablets by  mouth 2 (two) times daily.  . valsartan (DIOVAN) 40 MG tablet Take 40 mg by mouth daily.   No current facility-administered medications on file prior to visit.    Allergies  Allergen Reactions  . Atorvastatin Other (See Comments)    Extreme pain  . Codeine Other (See Comments)    Headache  . Pravastatin Other (See Comments)    Exreme pain  . Statins Other (See Comments)    Extreme pain  . Amitriptyline Other (See Comments)    Unspecified  . Fish Oil Nausea Only  . Nuvigil [Armodafinil] Other (See Comments)    Unspecified  . Erythromycin Other (See Comments)    Reaction unspecified  . Erythromycin Base Rash  . Flax Seed [Bio-Flax] Rash    Linseed  . Hydrocodone-Acetaminophen Rash  . Morphine Nausea And Vomiting and Rash  . Nsaids Nausea Only  . Sertraline Rash  . Sinemet [Carbidopa W-Levodopa] Rash  . Sulfamethoxazole Rash   PMHx:   Past Medical History:  Diagnosis Date  . Arthritis   . ASCVD (arteriosclerotic cardiovascular disease)   . Depression   . Fibromyalgia   . Gait disorder 10/26/2012  . GERD (gastroesophageal reflux disease)   . GI bleed   . Hyperlipidemia   . Hypertension   . Hypothyroid   . Periprosthetic supracondylar fracture of femur, left  03/23/2017  . Restless leg syndrome   . Rhinitis 06/25/2010  . Sleep apnea   . TIA (transient ischemic attack)    3        . Vitamin D deficiency    Immunization History  Administered Date(s) Administered  . DT 01/03/2015  . Influenza Whole 03/30/2010  . Influenza, High Dose Seasonal PF 03/13/2015  . Influenza-Unspecified 03/31/2011, 05/14/2014, 02/27/2016  . Pneumococcal Conjugate-13 04/17/2015  . Pneumococcal Polysaccharide-23 01/06/2017  . Pneumococcal-Unspecified 06/30/2005  . Td 06/30/2004  . Zoster 07/01/2007   Past Surgical History:  Procedure Laterality Date  . ABDOMINAL HYSTERECTOMY  1992  . ANTERIOR CERVICAL DECOMPRESSION/DISCECTOMY FUSION 4 LEVELS N/A 06/14/2015   Procedure: Cervical  three-four Cervical four-five Cervical five- six Cervical six- seven  Anterior cervical decompression/diskectomy/fusion;  Surgeon: Maeola HarmanJoseph Stern, MD;  Location: [facility];  Service: Neurosurgery;  Laterality: N/A;  C3-4 C4-5 C5-6 C6-7 Anterior cervical decompression/diskectomy/fusion  . APPENDECTOMY  1960  . CESAREAN SECTION  1983 & 1886  . JOINT REPLACEMENT     2   LEFT  1 RIGHT   . KNEE SURGERY    . ORIF FEMUR FRACTURE Left 03/24/2017   Procedure: OPEN REDUCTION INTERNAL FIXATION (ORIF) FEMUR FRACTURE;  Surgeon: Myrene GalasHandy, Michael, MD;  Location: [facility] OR;  Service: Orthopedics;  Laterality: Left;  . ROTATOR CUFF REPAIR Left 2005   2 LEFT  1  RIGHT  . SHOULDER ADHESION RELEASE    . SPINE SURGERY    . TOE SURGERY  LEFT       . TOE SURGERY     RIGHT  BIG TOE  . TONSILLECTOMY  1960   FHx:    Reviewed / unchanged  SHx:    Reviewed / unchanged  Systems Review:  Constitutional: Denies fever, chills, wt changes, headaches, insomnia, fatigue, night sweats, change in appetite. Eyes: Denies redness, blurred vision, diplopia, discharge, itchy, watery eyes.  ENT: Denies discharge, congestion, post nasal drip, epistaxis, sore throat, earache, hearing loss, dental pain, tinnitus, vertigo, sinus pain, snoring.  CV: Denies chest pain, palpitations, irregular heartbeat, syncope, dyspnea, diaphoresis, orthopnea, PND, claudication or edema. Respiratory: denies cough, dyspnea, DOE, pleurisy, hoarseness, laryngitis, wheezing.  Gastrointestinal: Denies dysphagia, odynophagia, heartburn, reflux, water brash, abdominal pain or cramps, nausea, vomiting, bloating, diarrhea, constipation, hematemesis, melena, hematochezia  or hemorrhoids. Genitourinary: Denies dysuria, frequency, urgency, nocturia, hesitancy, discharge, hematuria or flank pain. Musculoskeletal: Denies arthralgias, myalgias, stiffness, jt. swelling, pain, limping or strain/sprain.  Skin: Denies pruritus, rash, hives, warts, acne, eczema or change  in skin lesion(s). Neuro: No weakness, tremor, incoordination, spasms, paresthesia or pain. Psychiatric: Denies confusion, memory loss or sensory loss. Endo: Denies change in weight, skin or hair change.  Heme/Lymph: No excessive bleeding, bruising or enlarged lymph nodes.  Physical Exam  BP 112/78   Pulse 80   Temp (!) 97.5 F (36.4 C)   Resp 16   Appears well nourished, well groomed  and in no distress. Mild R hemi facial paresis.  Eyes: PERRLA, EOMs, conjunctiva no swelling or erythema. Sinuses: No frontal/maxillary tenderness ENT/Mouth: EAC's clear, TM's nl w/o erythema, bulging. Nares clear w/o erythema, swelling, exudates. Oropharynx clear without erythema or exudates. Oral hygiene is good. Tongue normal, non obstructing. Hearing intact.  Neck: Supple. Thyroid nl. Car 2+/2+ without bruits, nodes or JVD. Chest: Respirations nl with BS clear & equal w/o rales, rhonchi, wheezing or stridor.  Cor: Heart sounds normal w/ regular rate and rhythm without sig. murmurs, gallops, clicks or rubs. Peripheral pulses normal and equal  without edema.  Abdomen: Soft & bowel sounds normal. Non-tender w/o guarding, rebound, hernias, masses or organomegaly.  Lymphatics: Unremarkable.  Musculoskeletal: In W/C with LLE in Extension Skin: Warm, dry without exposed rashes, lesions or ecchymosis apparent.  Neuro: Cranial nerves intact, reflexes equal bilaterally. Sensory-motor testing grossly intact. Tendon reflexes grossly intact.  Pysch: Alert & oriented x 3.  Insight and judgement nl & appropriate. No ideations.  Assessment and Plan:  1. Closed fracture of distal end of left femur, unspecified fracture morphology, sequela  -Continue LPT /[facility]   2. Anemia, unspecified type  - CBC with Differential/Platelet  3. Hypocalcemia  - BASIC METABOLIC PANEL WITH GFR  4. Urinary tract infection    5. Essential hypertension  - Continue medication, monitor blood pressure at home.  - Continue  DASH diet. Reminder to go to the ER if any CP,  SOB, nausea, dizziness, severe HA, changes vision/speech.  6. Gastroesophageal reflux disease  - Continue diet, exercise, lifestyle modifications.   7. Medication management  - CBC with Differential/Platelet - BASIC METABOLIC PANEL WITH GFR       Discussed  regular exercise, BP monitoring, weight control to achieve/maintain BMI less than 25 and discussed med and SE's. Recommended labs to assess and monitor clinical status with further disposition pending results of labs. Over 45 minutes of exam, counseling, chart review was performed.

## 2017-04-28 NOTE — Patient Instructions (Signed)

## 2017-04-29 ENCOUNTER — Encounter: Payer: Self-pay | Admitting: Internal Medicine

## 2017-04-29 LAB — CBC WITH DIFFERENTIAL/PLATELET
Basophils Absolute: 81 {cells}/uL (ref 0–200)
Basophils Relative: 1.4 %
Eosinophils Absolute: 191 {cells}/uL (ref 15–500)
Eosinophils Relative: 3.3 %
HCT: 38.1 % (ref 35.0–45.0)
Hemoglobin: 12.5 g/dL (ref 11.7–15.5)
Lymphs Abs: 1270 {cells}/uL (ref 850–3900)
MCH: 28.5 pg (ref 27.0–33.0)
MCHC: 32.8 g/dL (ref 32.0–36.0)
MCV: 87 fL (ref 80.0–100.0)
MPV: 9.2 fL (ref 7.5–12.5)
Monocytes Relative: 9.4 %
Neutro Abs: 3712 {cells}/uL (ref 1500–7800)
Neutrophils Relative %: 64 %
Platelets: 294 Thousand/uL (ref 140–400)
RBC: 4.38 Million/uL (ref 3.80–5.10)
RDW: 13.1 % (ref 11.0–15.0)
Total Lymphocyte: 21.9 %
WBC mixed population: 545 {cells}/uL (ref 200–950)
WBC: 5.8 Thousand/uL (ref 3.8–10.8)

## 2017-04-29 LAB — BASIC METABOLIC PANEL WITH GFR
BUN: 16 mg/dL (ref 7–25)
CO2: 31 mmol/L (ref 20–32)
Calcium: 9.8 mg/dL (ref 8.6–10.4)
Chloride: 105 mmol/L (ref 98–110)
Creat: 0.73 mg/dL (ref 0.50–0.99)
GFR, Est African American: 98 mL/min/{1.73_m2} (ref >=60)
GFR, Est Non African American: 85 mL/min/{1.73_m2} (ref >=60)
Glucose, Bld: 91 mg/dL (ref 65–99)
Potassium: 4.5 mmol/L (ref 3.5–5.3)
Sodium: 144 mmol/L (ref 135–146)

## 2017-05-04 ENCOUNTER — Ambulatory Visit: Payer: Self-pay | Admitting: Adult Health

## 2017-05-26 ENCOUNTER — Other Ambulatory Visit: Payer: Self-pay | Admitting: Internal Medicine

## 2017-05-26 MED ORDER — PHENTERMINE HCL 37.5 MG PO TABS: ORAL_TABLET | ORAL | 5 refills | Status: DC

## 2017-06-01 ENCOUNTER — Other Ambulatory Visit: Payer: Self-pay | Admitting: Internal Medicine

## 2017-06-17 ENCOUNTER — Telehealth: Payer: Self-pay | Admitting: Physician Assistant

## 2017-06-17 MED ORDER — TELMISARTAN 20 MG PO TABS: 20.0000 mg | ORAL_TABLET | Freq: Every day | ORAL | 1 refills | Status: DC

## 2017-06-17 NOTE — Telephone Encounter (Signed)
Pt was informed of med recall & pt states that her pharmacy has already informed her the recall & has already changed her meds & she has picked up the new RX from the pharmacy. Provider has been informed of this as well.

## 2017-06-17 NOTE — Telephone Encounter (Signed)
Patient is on valsartan 27m will switch to micardis 261mdue to recall Please call patient, tell her to stop valsartan and start new medication

## 2017-06-25 ENCOUNTER — Other Ambulatory Visit: Payer: Self-pay | Admitting: Physician Assistant

## 2017-06-25 ENCOUNTER — Other Ambulatory Visit: Payer: Self-pay | Admitting: Internal Medicine

## 2017-06-25 DIAGNOSIS — H6061 Unspecified chronic otitis externa, right ear: Secondary | ICD-10-CM

## 2017-07-01 ENCOUNTER — Other Ambulatory Visit: Payer: Self-pay | Admitting: *Deleted

## 2017-07-01 MED ORDER — MECLIZINE HCL 25 MG PO TABS: 25.0000 mg | ORAL_TABLET | Freq: Three times a day (TID) | ORAL | 0 refills | Status: DC | PRN

## 2017-07-20 ENCOUNTER — Other Ambulatory Visit: Payer: Self-pay | Admitting: Internal Medicine

## 2017-07-20 DIAGNOSIS — G2581 Restless legs syndrome: Secondary | ICD-10-CM

## 2017-08-07 ENCOUNTER — Other Ambulatory Visit: Payer: Self-pay | Admitting: Internal Medicine

## 2017-08-12 ENCOUNTER — Other Ambulatory Visit: Payer: Self-pay | Admitting: Internal Medicine

## 2017-08-12 ENCOUNTER — Telehealth: Payer: Self-pay

## 2017-08-12 ENCOUNTER — Other Ambulatory Visit: Payer: Self-pay | Admitting: Physician Assistant

## 2017-08-12 MED ORDER — TELMISARTAN 20 MG PO TABS: 20.0000 mg | ORAL_TABLET | Freq: Every day | ORAL | 1 refills | Status: DC

## 2017-08-12 NOTE — Telephone Encounter (Signed)
Pt made aware of MICARDIS that has been sent.

## 2017-08-25 ENCOUNTER — Ambulatory Visit: Payer: Medicare Other | Admitting: Physician Assistant

## 2017-08-25 VITALS — BP 118/72 | HR 88 | Temp 97.2°F | Resp 16 | Ht 63.5 in | Wt 172.0 lb

## 2017-08-25 DIAGNOSIS — R350 Frequency of micturition: Secondary | ICD-10-CM

## 2017-08-25 DIAGNOSIS — R2689 Other abnormalities of gait and mobility: Secondary | ICD-10-CM

## 2017-08-25 DIAGNOSIS — M791 Myalgia, unspecified site: Secondary | ICD-10-CM

## 2017-08-25 DIAGNOSIS — D649 Anemia, unspecified: Secondary | ICD-10-CM | POA: Diagnosis not present

## 2017-08-25 NOTE — Progress Notes (Signed)
Subjective:    Patient ID: Crystal [name], female    DOB: 1951/05/30, 67 y.o.   MRN: [id number]  HPI 67 y.o. WF with history of HTN, ASVCD, OSA, DM2, chron's, RLS, FM, gait DO, depression presents with multiple complaints. She had recent left periprosthetic supracondylar fx, s/p ORIF with Dr. Alvan Dame in Sept, has just finished PT. She states she has had multiple falls, she has fallen out of bed, then 3 weeks ago she was at [employer], she was going to sit on a chair and she fell side ways and hit her right head, she states her balance has been worse since that time. She states her legs are "quivering" a lot and feels legs were weak at PT last 2 weeks, states it is worse with activity, worse later in the day. She also feels some pain in her lower back and feels that she is urinary frequency/some burning.   No double vision, changes in her vision.   She has an appointment in April with neuro. Last MRI brain was 2014.  Has had previous spinal surgery, last MRI lumbar: IMPRESSION: 1.  Unchanged L5-S1 anterolisthesis with discectomy and posterior decompression. 2.  Interval L4-L5 posterior rod and pedicle screw fixation and discectomy. Resection of previously seen extruded disc fragment. No recurrent stenosis. 3.  L3-L4 mild adjacent segment degenerative disc disease.  Lab Results  Component Value Date   HGBA1C 5.9 (H) 01/06/2017   Lab Results  Component Value Date   TSH 0.445 03/26/2017   Review of Systems See HPI    Objective:   Physical Exam  Constitutional: She is oriented to person, place, and time. She appears well-developed and well-nourished. No distress.  HENT:  Head: Normocephalic and atraumatic.  Eyes: Conjunctivae are normal. Pupils are equal, round, and reactive to light.  Neck: Normal range of motion. Neck supple.  Cardiovascular: Normal rate and regular rhythm.  Pulmonary/Chest: Effort normal and breath sounds normal.  Abdominal: Soft. Bowel sounds are normal. She  exhibits no distension and no mass. There is no tenderness. There is no rebound and no guarding.  Musculoskeletal:  Patient is able to ambulate well.   Gait is antalgic. Straight leg raising with dorsiflexion negative bilaterally for radicular symptoms. Sensory exam in the legs is abnormal left leg Knee reflexes are decrease bilateral.  Ankle reflexes are decrease bilateral Strength is normal and symmetric in arms and legs. There is not SI tenderness to palpation.  There is paraspinal muscle spasm.   There is not midline tenderness.   ROM of spine with normal but painful flexion, extension, lateral range of motion to the right and left, and rotation to the right and left.    Lymphadenopathy:    She has no cervical adenopathy.  Neurological: She is alert and oriented to person, place, and time. She displays abnormal reflex. She displays no atrophy and no tremor. A cranial nerve deficit (right facial hemiparesis unchanged, tongue minline) is present. She exhibits normal muscle tone. She displays no seizure activity. Coordination abnormal.  Skin: Skin is warm and dry.       Assessment & Plan:   Urinary frequency Rule out UTI -     Urinalysis, Routine w reflex microscopic -     Urine Culture  Imbalance  Has had imbalance in the past however since fall has had worsening imbalance/memory issues, will get CT acute, has follow up with neuro 03/04- will let them get formal work up/MRI if they feel it is needed History  of lumbar surgery, abnormal exam- suggest follow up with neurosurgery  -     CBC with Differential/Platelet -     BASIC METABOLIC PANEL WITH GFR -     Hepatic function panel -     TSH -     Iron,Total/Total Iron Binding Cap -     Vitamin B12 -     CT Head Wo Contrast; Future  Myalgia -     CBC with Differential/Platelet -     BASIC METABOLIC PANEL WITH GFR -     Hepatic function panel -     TSH -     Sedimentation rate -     CK  Anemia, unspecified type -     CBC  with Differential/Platelet -     Iron,Total/Total Iron Binding Cap -     Vitamin B12   Future Appointments  Date Time Provider [place]  09/29/2017 11:30 AM Star Age, MD [facility] None  10/06/2017 10:00 AM Unk Pinto, MD [facility] None

## 2017-08-25 NOTE — Patient Instructions (Signed)
We will get Ct head to look for any bleeding Follow up with neuro  We will get labs  Follow up with neurosurgery or ortho for you back  Will go to the ER if worsening headache, changes vision/speech, imbalance, weakness.   Fall Prevention in the Home Falls can cause injuries and can affect people from all age groups. There are many simple things that you can do to make your home safe and to help prevent falls. What can I do on the outside of my home?  Regularly repair the edges of walkways and driveways and fix any cracks.  Remove high doorway thresholds.  Trim any shrubbery on the main path into your home.  Use bright outdoor lighting.  Clear walkways of debris and clutter, including tools and rocks.  Regularly check that handrails are securely fastened and in good repair. Both sides of any steps should have handrails.  Install guardrails along the edges of any raised decks or porches.  Have leaves, snow, and ice cleared regularly.  Use sand or salt on walkways during winter months.  In the garage, clean up any spills right away, including grease or oil spills. What can I do in the bathroom?  Use night lights.  Install grab bars by the toilet and in the tub and shower. Do not use towel bars as grab bars.  Use non-skid mats or decals on the floor of the tub or shower.  If you need to sit down while you are in the shower, use a plastic, non-slip stool.  Keep the floor dry. Immediately clean up any water that spills on the floor.  Remove soap buildup in the tub or shower on a regular basis.  Attach bath mats securely with double-sided non-slip rug tape.  Remove throw rugs and other tripping hazards from the floor. What can I do in the bedroom?  Use night lights.  Make sure that a bedside light is easy to reach.  Do not use oversized bedding that drapes onto the floor.  Have a firm chair that has side arms to use for getting dressed.  Remove throw rugs and  other tripping hazards from the floor. What can I do in the kitchen?  Clean up any spills right away.  Avoid walking on wet floors.  Place frequently used items in easy-to-reach places.  If you need to reach for something above you, use a sturdy step stool that has a grab bar.  Keep electrical cables out of the way.  Do not use floor polish or wax that makes floors slippery. If you have to use wax, make sure that it is non-skid floor wax.  Remove throw rugs and other tripping hazards from the floor. What can I do in the stairways?  Do not leave any items on the stairs.  Make sure that there are handrails on both sides of the stairs. Fix handrails that are broken or loose. Make sure that handrails are as long as the stairways.  Check any carpeting to make sure that it is firmly attached to the stairs. Fix any carpet that is loose or worn.  Avoid having throw rugs at the top or bottom of stairways, or secure the rugs with carpet tape to prevent them from moving.  Make sure that you have a light switch at the top of the stairs and the bottom of the stairs. If you do not have them, have them installed. What are some other fall prevention tips?  Wear closed-toe shoes that  fit well and support your feet. Wear shoes that have rubber soles or low heels.  When you use a stepladder, make sure that it is completely opened and that the sides are firmly locked. Have someone hold the ladder while you are using it. Do not climb a closed stepladder.  Add color or contrast paint or tape to grab bars and handrails in your home. Place contrasting color strips on the first and last steps.  Use mobility aids as needed, such as canes, walkers, scooters, and crutches.  Turn on lights if it is dark. Replace any light bulbs that burn out.  Set up furniture so that there are clear paths. Keep the furniture in the same spot.  Fix any uneven floor surfaces.  Choose a carpet design that does not hide  the edge of steps of a stairway.  Be aware of any and all pets.  Review your medicines with your healthcare provider. Some medicines can cause dizziness or changes in blood pressure, which increase your risk of falling. Talk with your health care provider about other ways that you can decrease your risk of falls. This may include working with a physical therapist or trainer to improve your strength, balance, and endurance. This information is not intended to replace advice given to you by your health care provider. Make sure you discuss any questions you have with your health care provider. Document Released: 06/06/2002 Document Revised: 11/13/2015 Document Reviewed: 07/21/2014 Elsevier Interactive Patient Education  2018 [place] Ask your health care provider which exercises are safe for you. Do exercises exactly as told by your health care provider and adjust them as directed. It is normal to feel mild stretching, pulling, tightness, or discomfort as you do these exercises, but you should stop right away if you feel sudden pain or your pain gets worse. Do not begin these exercises until told by your health care provider. Stretching and range of motion exercises These exercises warm up your muscles and joints and improve the movement and flexibility of your hips and your back. These exercises may also help to relieve pain, numbness, and tingling. Exercise A: Single knee to chest  1. Lie on your back on a firm surface with both legs straight. 2. Bend one of your knees. Use your hands to move your knee up toward your chest until you feel a gentle stretch in your lower back and buttock. ? Hold your leg in this position by holding onto the front of your knee. ? Keep your other leg as straight as possible. 3. Hold for __________ seconds. 4. Slowly return to the starting position. 5. Repeat this exercise with your other leg. Repeat __________ times. Complete this  exercise __________ times a day. Exercise B: Double knee to chest  1. Lie on your back on a firm surface with both legs straight. 2. Bend one of your knees and move it toward your chest until you feel a gentle stretch in your lower back and buttock. 3. Tense your abdominal muscles and repeat the previous step with your other leg. 4. Hold both of your legs in this position by holding onto the backs of your thighs or the fronts of your knees. 5. Hold for __________ seconds. 6. Tense your abdominal muscles and slowly move your legs back to the floor, one leg at a time. Repeat __________ times. Complete this exercise __________ times a day. Strengthening exercises These exercises build strength and endurance in your back. Endurance  is the ability to use your muscles for a long time, even after they get tired. Exercise C: Pelvic tilt 1. Lie on your back on a firm bed or the floor. Bend your knees and keep your feet flat. 2. Tense your abdominal muscles. Tip your pelvis up toward the ceiling and flatten your lower back into the floor. ? To help with this exercise, you may place a small towel under your lower back and try to push your back into the towel. 3. Hold for __________ seconds. 4. Let your muscles relax completely before you repeat this exercise. Repeat __________ times. Complete this exercise __________ times a day. Exercise D: Abdominal crunch  1. Lie on your back on a firm surface. Bend your knees and keep your feet flat. Cross your arms over your chest. 2. Tuck your chin down toward your chest, without bending your neck. 3. Use your abdominal muscles to lift your upper body off of the ground, straight up into the air. ? Try to lift yourself until your shoulder blades are off the ground. You may need to work up to this. ? Keep your lower back on the ground while you crunch upward. ? Do not hold your breath. 4. Slowly lower yourself down. Keep your abdominal muscles tense until you are  back to the starting position. Repeat __________ times. Complete this exercise __________ times a day. Exercise E: Alternating arm and leg raises  1. Get on your hands and knees on a firm surface. If you are on a hard floor, you may want to use padding to cushion your knees, such as an exercise mat. 2. Line up your arms and legs. Your hands should be below your shoulders, and your knees should be below your hips. 3. Lift your left leg behind you. At the same time, raise your right arm and straighten it in front of you. ? Do not lift your leg higher than your hip. ? Do not lift your arm higher than your shoulder. ? Keep your abdominal and back muscles tight. ? Keep your hips facing the ground. ? Do not arch your back. ? Keep your balance carefully, and do not hold your breath. 4. Hold for __________ seconds. 5. Slowly return to the starting position and repeat with your right leg and your left arm. Repeat __________ times. Complete this exercise __________ times a day. Posture and body mechanics  Body mechanics refers to the movements and positions of your body while you do your daily activities. Posture is part of body mechanics. Good posture and healthy body mechanics can help to relieve stress in your body's tissues and joints. Good posture means that your spine is in its natural S-curve position (your spine is neutral), your shoulders are pulled back slightly, and your head is not tipped forward. The following are general guidelines for applying improved posture and body mechanics to your everyday activities. Standing   When standing, keep your spine neutral and your feet about hip-width apart. Keep a slight bend in your knees. Your ears, shoulders, and hips should line up.  When you do a task in which you stand in one place for a long time, place one foot up on a stable object that is 2-4 inches (5-10 cm) high, such as a footstool. This helps keep your spine neutral. Sitting   When  sitting, keep your spine neutral and keep your feet flat on the floor. Use a footrest, if necessary, and keep your thighs parallel to the floor.  Avoid rounding your shoulders, and avoid tilting your head forward.  When working at a desk or a computer, keep your desk at a height where your hands are slightly lower than your elbows. Slide your chair under your desk so you are close enough to maintain good posture.  When working at a computer, place your monitor at a height where you are looking straight ahead and you do not have to tilt your head forward or downward to look at the screen. Resting  When lying down and resting, avoid positions that are most painful for you.  If you have pain with activities such as sitting, bending, stooping, or squatting (flexion-based activities), lie in a position in which your body does not bend very much. For example, avoid curling up on your side with your arms and knees near your chest (fetal position).  If you have pain with activities such as standing for a long time or reaching with your arms (extension-based activities), lie with your spine in a neutral position and bend your knees slightly. Try the following positions: ? Lying on your side with a pillow between your knees. ? Lying on your back with a pillow under your knees.  Lifting   When lifting objects, keep your feet at least shoulder-width apart and tighten your abdominal muscles.  Bend your knees and hips and keep your spine neutral. It is important to lift using the strength of your legs, not your back. Do not lock your knees straight out.  Always ask for help to lift heavy or awkward objects. This information is not intended to replace advice given to you by your health care provider. Make sure you discuss any questions you have with your health care provider. Document Released: 06/16/2005 Document Revised: 02/21/2016 Document Reviewed: 03/27/2015 Elsevier Interactive Patient Education   [employer].

## 2017-08-26 LAB — BASIC METABOLIC PANEL WITH GFR
BUN/Creatinine Ratio: 28 (calc) — ABNORMAL HIGH (ref 6–22)
BUN: 26 mg/dL — ABNORMAL HIGH (ref 7–25)
CO2: 31 mmol/L (ref 20–32)
Calcium: 10.1 mg/dL (ref 8.6–10.4)
Chloride: 102 mmol/L (ref 98–110)
Creat: 0.94 mg/dL (ref 0.50–0.99)
GFR, Est African American: 72 mL/min/{1.73_m2} (ref >=60)
GFR, Est Non African American: 62 mL/min/{1.73_m2} (ref >=60)
Glucose, Bld: 90 mg/dL (ref 65–99)
Potassium: 4.2 mmol/L (ref 3.5–5.3)
Sodium: 144 mmol/L (ref 135–146)

## 2017-08-26 LAB — CBC WITH DIFFERENTIAL/PLATELET
Basophils Absolute: 103 cells/uL (ref 0–200)
Basophils Relative: 1.2 %
Eosinophils Absolute: 129 cells/uL (ref 15–500)
Eosinophils Relative: 1.5 %
HCT: 42.7 % (ref 35.0–45.0)
Hemoglobin: 14.4 g/dL (ref 11.7–15.5)
Lymphs Abs: 1686 cells/uL (ref 850–3900)
MCH: 30.1 pg (ref 27.0–33.0)
MCHC: 33.7 g/dL (ref 32.0–36.0)
MCV: 89.1 fL (ref 80.0–100.0)
MPV: 9.1 fL (ref 7.5–12.5)
Monocytes Relative: 7 %
Neutro Abs: 6080 cells/uL (ref 1500–7800)
Neutrophils Relative %: 70.7 %
Platelets: 358 10*3/uL (ref 140–400)
RBC: 4.79 10*6/uL (ref 3.80–5.10)
RDW: 13.2 % (ref 11.0–15.0)
Total Lymphocyte: 19.6 %
WBC mixed population: 602 cells/uL (ref 200–950)
WBC: 8.6 10*3/uL (ref 3.8–10.8)

## 2017-08-26 LAB — HEPATIC FUNCTION PANEL
AG Ratio: 1.5 (calc) (ref 1.0–2.5)
ALT: 12 U/L (ref 6–29)
AST: 15 U/L (ref 10–35)
Albumin: 4.5 g/dL (ref 3.6–5.1)
Alkaline phosphatase (APISO): 58 U/L (ref 33–130)
Bilirubin, Direct: 0.1 mg/dL (ref 0.0–0.2)
Globulin: 3 g/dL (calc) (ref 1.9–3.7)
Indirect Bilirubin: 0.3 mg/dL (calc) (ref 0.2–1.2)
Total Bilirubin: 0.4 mg/dL (ref 0.2–1.2)
Total Protein: 7.5 g/dL (ref 6.1–8.1)

## 2017-08-26 LAB — TSH: TSH: 3.12 mIU/L (ref 0.40–4.50)

## 2017-08-26 LAB — VITAMIN B12: Vitamin B-12: 437 pg/mL (ref 200–1100)

## 2017-08-26 LAB — SEDIMENTATION RATE: Sed Rate: 17 mm/h (ref 0–30)

## 2017-08-26 LAB — IRON, TOTAL/TOTAL IRON BINDING CAP
%SAT: 14 % (calc) (ref 11–50)
Iron: 53 ug/dL (ref 45–160)
TIBC: 384 mcg/dL (calc) (ref 250–450)

## 2017-08-26 LAB — CK: Total CK: 43 U/L (ref 29–143)

## 2017-09-03 ENCOUNTER — Ambulatory Visit: Payer: Medicare Other | Admitting: Rheumatology

## 2017-09-03 ENCOUNTER — Encounter: Payer: Self-pay | Admitting: Rheumatology

## 2017-09-03 VITALS — BP 118/76 | HR 88 | Resp 14 | Ht 64.0 in | Wt 172.0 lb

## 2017-09-03 DIAGNOSIS — Z8719 Personal history of other diseases of the digestive system: Secondary | ICD-10-CM

## 2017-09-03 DIAGNOSIS — M797 Fibromyalgia: Secondary | ICD-10-CM | POA: Diagnosis not present

## 2017-09-03 DIAGNOSIS — S7225XA Nondisplaced subtrochanteric fracture of left femur, initial encounter for closed fracture: Secondary | ICD-10-CM | POA: Insufficient documentation

## 2017-09-03 DIAGNOSIS — Z8639 Personal history of other endocrine, nutritional and metabolic disease: Secondary | ICD-10-CM | POA: Diagnosis not present

## 2017-09-03 DIAGNOSIS — M51369 Other intervertebral disc degeneration, lumbar region without mention of lumbar back pain or lower extremity pain: Secondary | ICD-10-CM

## 2017-09-03 DIAGNOSIS — S7225XS Nondisplaced subtrochanteric fracture of left femur, sequela: Secondary | ICD-10-CM | POA: Diagnosis not present

## 2017-09-03 DIAGNOSIS — M19072 Primary osteoarthritis, left ankle and foot: Secondary | ICD-10-CM

## 2017-09-03 DIAGNOSIS — M19041 Primary osteoarthritis, right hand: Secondary | ICD-10-CM

## 2017-09-03 DIAGNOSIS — M5136 Other intervertebral disc degeneration, lumbar region: Secondary | ICD-10-CM | POA: Diagnosis not present

## 2017-09-03 DIAGNOSIS — Z8669 Personal history of other diseases of the nervous system and sense organs: Secondary | ICD-10-CM

## 2017-09-03 DIAGNOSIS — M19071 Primary osteoarthritis, right ankle and foot: Secondary | ICD-10-CM | POA: Diagnosis not present

## 2017-09-03 DIAGNOSIS — M5030 Other cervical disc degeneration, unspecified cervical region: Secondary | ICD-10-CM | POA: Diagnosis not present

## 2017-09-03 DIAGNOSIS — Z8679 Personal history of other diseases of the circulatory system: Secondary | ICD-10-CM | POA: Diagnosis not present

## 2017-09-03 DIAGNOSIS — Z96653 Presence of artificial knee joint, bilateral: Secondary | ICD-10-CM | POA: Diagnosis not present

## 2017-09-03 DIAGNOSIS — Z8659 Personal history of other mental and behavioral disorders: Secondary | ICD-10-CM | POA: Diagnosis not present

## 2017-09-03 DIAGNOSIS — M19042 Primary osteoarthritis, left hand: Secondary | ICD-10-CM

## 2017-09-03 HISTORY — DX: Nondisplaced subtrochanteric fracture of left femur, initial encounter for closed fracture: S72.25XA

## 2017-09-03 NOTE — Progress Notes (Signed)
Office Visit Note  Patient: Crystal [name]             Date of Birth: 01-04-1951           MRN: [id number]             PCP: [name], MD Referring: [name], MD Visit Date: 09/03/2017 Occupation: @GUAROCC @    Subjective:  Pain in multiple joints.   History of Present Illness: Crystal [name] is a 67 y.o. female with history of fibromyalgia osteoarthritis and disc disease.  According to patient in September 2018 she fell and fractured her left femur for which she had to undergo surgery.  She continues to have discomfort in her hands and feet due to underlying osteoarthritis.  She also have disc disease in her cervical and lumbar region which causes discomfort.  She states she had a Baker's cyst in her right knee joint for which she was seen by Dr. Valentina Gu was going under physical therapy.  She has generalized pain from underlying fibromyalgia.  Activities of Daily Living:  Patient reports morning stiffness for 1 hour.   Patient Denies nocturnal pain.  Difficulty dressing/grooming: Reports Difficulty climbing stairs: Reports Difficulty getting out of chair: Reports Difficulty using hands for taps, buttons, cutlery, and/or writing: Reports   Review of Systems  Constitutional: Positive for fatigue. Negative for night sweats, weight gain, weight loss and weakness.  HENT: Positive for mouth dryness. Negative for mouth sores, trouble swallowing, trouble swallowing and nose dryness.   Eyes: Positive for dryness. Negative for pain, redness and visual disturbance.  Respiratory: Negative for cough, shortness of breath and difficulty breathing.   Cardiovascular: Negative for chest pain, palpitations, hypertension, irregular heartbeat and swelling in legs/feet.  Gastrointestinal: Positive for constipation. Negative for blood in stool and diarrhea.  Endocrine: Negative for increased urination.  Genitourinary: Negative for pelvic pain and vaginal dryness.  Musculoskeletal:  Positive for arthralgias, joint pain and morning stiffness. Negative for joint swelling, myalgias, muscle weakness, muscle tenderness and myalgias.  Skin: Negative for color change, rash, hair loss, redness, skin tightness, ulcers and sensitivity to sunlight.  Allergic/Immunologic: Negative for susceptible to infections.  Neurological: Negative for dizziness, tremors, headaches, memory loss and night sweats.  Hematological: Negative for bruising/bleeding tendency and swollen glands.  Psychiatric/Behavioral: Negative for depressed mood, confusion and sleep disturbance. The patient is not nervous/anxious.     PMFS History:  Patient Active Problem List   Diagnosis Date Noted  . Closed nondisplaced subtrochanteric fracture of left femur (HCC) 09/03/2017  . At risk for adverse drug event 04/09/2017  . Polypharmacy 04/09/2017  . Periprosthetic supracondylar fracture of femur, left  03/23/2017  . B12 deficiency anemia 06/16/2016  . Fatigue 03/26/2016  . Sleep talking 03/26/2016  . IBS (irritable colon syndrome) 08/09/2015  . Spinal stenosis in cervical region 06/14/2015  . Morbid obesity (BMI 31.88)  10/31/2014  . Medication management 10/25/2013  . Rotator cuff arthropathy 09/15/2013  . Hyperlipidemia   . T2_NIDDM   . Hypothyroid   . Depression, major, recurrent, in partial remission (HCC)   . Vitamin D deficiency   . ASCVD   . Gait disorder 10/26/2012  . Rhinitis 06/25/2010  . Obstructive sleep apnea 06/21/2010  . RESTLESS LEG SYNDROME 06/21/2010  . Essential hypertension 06/21/2010  . GERD 06/21/2010  . Crohn's Enterocolitis / Regional enteritis 06/21/2010  . Fibromyalgia 06/21/2010    Past Medical History:  Diagnosis Date  . Arthritis   . ASCVD (arteriosclerotic cardiovascular disease)   .  Depression   . Fibromyalgia   . Gait disorder 10/26/2012  . GERD (gastroesophageal reflux disease)   . GI bleed   . Hyperlipidemia   . Hypertension   . Hypothyroid   . Periprosthetic  supracondylar fracture of femur, left  03/23/2017  . Restless leg syndrome   . Rhinitis 06/25/2010  . Sleep apnea   . TIA (transient ischemic attack)    3        . Vitamin D deficiency     Family History  Problem Relation Age of Onset  . Leukemia Sister   . Lupus Sister   . Depression Sister   . Rheum arthritis Sister   . Heart disease Father   . Hypertension Father   . Hyperlipidemia Father   . Neuropathy Father   . Cancer Mother   . Diabetes Mother   . Osteoporosis Mother   . Breast cancer Mother   . Migraines Daughter    Past Surgical History:  Procedure Laterality Date  . ABDOMINAL HYSTERECTOMY  1992  . ANTERIOR CERVICAL DECOMPRESSION/DISCECTOMY FUSION 4 LEVELS N/A 06/14/2015   Procedure: Cervical three-four Cervical four-five Cervical five- six Cervical six- seven  Anterior cervical decompression/diskectomy/fusion;  Surgeon: Maeola Harman, MD;  Location: [facility];  Service: Neurosurgery;  Laterality: N/A;  C3-4 C4-5 C5-6 C6-7 Anterior cervical decompression/diskectomy/fusion  . APPENDECTOMY  1960  . CESAREAN SECTION  1983 & 1886  . JOINT REPLACEMENT     2   LEFT  1 RIGHT   . KNEE SURGERY    . ORIF FEMUR FRACTURE Left 03/24/2017   Procedure: OPEN REDUCTION INTERNAL FIXATION (ORIF) FEMUR FRACTURE;  Surgeon: Myrene Galas, MD;  Location: [facility];  Service: Orthopedics;  Laterality: Left;  . ROTATOR CUFF REPAIR Left 2005   2 LEFT  1  RIGHT  . SHOULDER ADHESION RELEASE    . SPINE SURGERY    . TOE SURGERY     LEFT       . TOE SURGERY     RIGHT  BIG TOE  . TONSILLECTOMY  1960   Social History   Social History Narrative   Drinks about 1 cup of coffee a day, occasional coke zero      Objective: Vital Signs: BP 118/76 (BP Location: Left Arm, Patient Position: Sitting, Cuff Size: Normal)   Pulse 88   Resp 14   Ht 5\' 4"  (1.626 m)   Wt 172 lb (78 kg) Comment: per patient  BMI 29.52 kg/m    Physical Exam  Constitutional: She is oriented to person, place, and  time. She appears well-developed and well-nourished.  HENT:  Head: Normocephalic and atraumatic.  Eyes: Conjunctivae and EOM are normal.  Neck: Normal range of motion.  Cardiovascular: Normal rate, regular rhythm, normal heart sounds and intact distal pulses.  Pulmonary/Chest: Effort normal and breath sounds normal.  Abdominal: Soft. Bowel sounds are normal.  Lymphadenopathy:    She has no cervical adenopathy.  Neurological: She is alert and oriented to person, place, and time.  Skin: Skin is warm and dry. Capillary refill takes less than 2 seconds.  Psychiatric: She has a normal mood and affect. Her behavior is normal.  Nursing note and vitals reviewed.    Musculoskeletal Exam: C-spine thoracic lumbar spine limited range of motion with discomfort.  Shoulder joints elbow joints are good range of motion.  She has severe osteoarthritis involving her DIP PIP joints with thickening.  She has bilateral total knee replacement.  She had Baker's cyst in her  right knee joint.  She has generalized hyperalgesia.  CDAI Exam: No CDAI exam completed.    Investigation: No additional findings. CBC Latest Ref Rng & Units 08/25/2017 04/28/2017 03/28/2017  WBC 3.8 - 10.8 Thousand/uL 8.6 5.8 11.1  Hemoglobin 11.7 - 15.5 g/dL 60.4 54.0 9.8(J)  Hematocrit 35.0 - 45.0 % 42.7 38.1 29(A)  Platelets 140 - 400 Thousand/uL 358 294 306   CMP Latest Ref Rng & Units 08/25/2017 04/28/2017 03/28/2017  Glucose 65 - 99 mg/dL 90 91 -  BUN 7 - 25 mg/dL 19(J) 16 14  Creatinine 0.50 - 0.99 mg/dL 4.78 2.95 0.6  Sodium 621 - 146 mmol/L 144 144 142  Potassium 3.5 - 5.3 mmol/L 4.2 4.5 4.9  Chloride 98 - 110 mmol/L 102 105 -  CO2 20 - 32 mmol/L 31 31 -  Calcium 8.6 - 10.4 mg/dL 30.8 9.8 -  Total Protein 6.1 - 8.1 g/dL 7.5 - -  Total Bilirubin 0.2 - 1.2 mg/dL 0.4 - -  Alkaline Phos 33 - 130 U/L - - -  AST 10 - 35 U/L 15 - -  ALT 6 - 29 U/L 12 - -    Imaging: No results found.  Speciality Comments: No specialty  comments available.    Procedures:  No procedures performed Allergies: Atorvastatin; Codeine; Pravastatin; Statins; Amitriptyline; Fish oil; Nuvigil [armodafinil]; Erythromycin; Erythromycin base; Flax seed [bio-flax]; Hydrocodone-acetaminophen; Morphine; Nsaids; Sertraline; Sinemet [carbidopa w-levodopa]; and Sulfamethoxazole   Assessment / Plan:     Visit Diagnoses: Primary osteoarthritis of both hands: She has severe osteoarthritis in her hands.  Joint protection muscle strengthening was discussed.  History of total bilateral knee replacement: She continues to have pain and discomfort in her knee joints.  She has been followed by Dr. Valentina Gu.  She had a Baker's cyst in her right knee joint.  She has tried physical therapy.  Closed nondisplaced subtrochanteric fracture of left femur, sequela - September 2018.  Status post surgery by Dr. Carola Frost.  She states she had good recovery from femur fracture.  Frequent falls: We had detailed discussion regarding muscle strengthening and fall prevention.  She has some exercises from her physical therapy have advised her to continue with that.  Primary osteoarthritis of both feet: Proper fitting shoes were discussed.  DDD (degenerative disc disease), cervical: Chronic pain  DDD (degenerative disc disease), lumbar - s/p fusion: Chronic pain  Fibromyalgia: She continues to have some generalized pain and discomfort.  History of vitamin D deficiency: She is on vitamin D supplement.  Other medical problems are listed as follows:  History of Crohn's disease  History of restless legs syndrome  History of hypertension  History of hypothyroidism  History of gastroesophageal reflux (GERD)  History of IBS  History of depression  History of sleep apnea    Orders: No orders of the defined types were placed in this encounter.  No orders of the defined types were placed in this encounter.   Face-to-face time spent with patient was 40 minutes.   Greater than 50% of time was spent in counseling and coordination of care.  Follow-Up Instructions: Return if symptoms worsen or fail to improve, for Osteoarthritis,FMS,DDD.   Pollyann Savoy, MD  Note - This record has been created using Animal nutritionist.  Chart creation errors have been sought, but may not always  have been located. Such creation errors do not reflect on  the standard of medical care.

## 2017-09-05 ENCOUNTER — Other Ambulatory Visit: Payer: Self-pay | Admitting: Internal Medicine

## 2017-09-09 ENCOUNTER — Ambulatory Visit
Admission: RE | Admit: 2017-09-09 | Discharge: 2017-09-09 | Disposition: A | Discharge status: Home / Self Care | Payer: Medicare Other | Source: Ambulatory Visit | Attending: Physician Assistant | Admitting: Physician Assistant

## 2017-09-09 DIAGNOSIS — R2689 Other abnormalities of gait and mobility: Secondary | ICD-10-CM

## 2017-09-11 ENCOUNTER — Other Ambulatory Visit: Payer: Self-pay | Admitting: *Deleted

## 2017-09-11 ENCOUNTER — Telehealth: Payer: Self-pay | Admitting: Acute Care

## 2017-09-11 DIAGNOSIS — R059 Cough, unspecified: Secondary | ICD-10-CM

## 2017-09-11 DIAGNOSIS — R05 Cough: Secondary | ICD-10-CM

## 2017-09-11 DIAGNOSIS — R911 Solitary pulmonary nodule: Secondary | ICD-10-CM

## 2017-09-11 NOTE — Telephone Encounter (Signed)
Called and spoke with patient regarding f/u appt Pt was former [facility] needing f/u with SG Scheduled appt with SG for 10/12/2017 at 11am Pt needs CT chest w/o contrast prior to appt Scheduled CT today, sent to [facility] with note attached Will follow up

## 2017-09-16 NOTE — Telephone Encounter (Signed)
CT has been scheduled 09/28/17 at 11:30 prior to pt's appt she has with SG 10/12/17.  Will close this encounter.

## 2017-09-17 ENCOUNTER — Ambulatory Visit: Payer: Medicare Other | Admitting: Physician Assistant

## 2017-09-17 ENCOUNTER — Encounter: Payer: Self-pay | Admitting: Physician Assistant

## 2017-09-17 VITALS — BP 120/62 | HR 102 | Temp 97.6°F | Ht 64.0 in | Wt 176.2 lb

## 2017-09-17 DIAGNOSIS — R29898 Other symptoms and signs involving the musculoskeletal system: Secondary | ICD-10-CM | POA: Diagnosis not present

## 2017-09-17 DIAGNOSIS — R350 Frequency of micturition: Secondary | ICD-10-CM | POA: Diagnosis not present

## 2017-09-17 DIAGNOSIS — M79604 Pain in right leg: Secondary | ICD-10-CM | POA: Diagnosis not present

## 2017-09-17 NOTE — Patient Instructions (Addendum)
Get back on miralax daily Will get a urine  Try going up on your gabapentin- take 2 at supper or bedtime- if this is helping can do 2 at supper and 2 at dinner after 1-2 weeks  Dr. Rexene Alberts can evaluate with an EMG to see if this is coming from back or not But I do suggest a follow up with Dr. Verneita Griffes

## 2017-09-17 NOTE — Progress Notes (Signed)
Subjective:    Patient ID: Crystal [name], female    DOB: 1951-01-01, 67 y.o.   MRN: [id number]  HPI 67 y.o. WF with history of chronic pain presents with right posterior knee pain, she has a long history of pain with this, history is provided from patient, we do not have good records of this information.   She has seen Dr. French Ana a year ago for right leg pain, informed it was a baker's cyst, she had MRI/US with them (we do not have a copy). She was then sent to Dr. Verneita Griffes, wanted to do PT however then she fell in sept and broke her femur so everything was put on hold.  She states that the pain returned over a month ago and has been much worse.  She currently has bilateral leg pain anterior thighs, anterior/lateral shins, but worse pain is posterior right knee and left anterior thigh where femur surgery was. She has quivering/sharp/sharp pain, will go into her foot. It is better with walking but she will have weakness I her legs with walking, pain is worse with resting/at night. She has tried tramadol, dicolfenac gel, CBD oil, and cymbalta that helped some but not much. She is on gabapentin 358m TID.   She also complains of constipation, she is not on miralax, she was suppose to leave a urine and wants to today, has felt warm. She still has gallbladder, has been having some RUQ pain.   She has OV with Dr. ARexene Alberts neurologist, has follow up April 1st. Has remote history of EMG with Dr. LErling Cruzdue to imbalance. She has had normal zinc, B12, RPR, PTH, Hep C, HIV, CPK, folate.    Blood pressure 120/62, pulse (!) 102, temperature 97.6 F (36.4 C), height 5' 4"  (1.626 m), weight 176 lb 3.2 oz (79.9 kg), SpO2 98 %.  Medications Current Outpatient Medications on File Prior to Visit  Medication Sig  . aspirin EC 81 MG tablet Take 81 mg by mouth daily.  . bisoprolol-hydrochlorothiazide (ZIAC) 10-6.25 MG tablet TAKE ONE TABLET BY MOUTH ONE TIME DAILY FOR BLOOD PRESSUREFILL 07/05/2015  . buPROPion  (WELLBUTRIN XL) 300 MG 24 hr tablet TAKE 1 TABLET BY MOUTH EVERY MORNING FOR MOOD AND FOCUS ATTENTION  . Cholecalciferol (VITAMIN D3) 50000 units CAPS Take 1 capsule by mouth once a week.  .Marland KitchenCIPRODEX OTIC suspension PLACE 4 DROPS INTO THE RIGHT EAR 2 (TWO) TIMES DAILY.  . cyclobenzaprine (FLEXERIL) 5 MG tablet TAKE 1 TABLET BY MOUTH 2 TIMES DAILY AS NEEDED FOR MUSCLE SPASMS.  .Marland Kitchendiclofenac sodium (VOLTAREN) 1 % GEL Apply 2 g topically 2 (two) times daily.  . DULoxetine (CYMBALTA) 60 MG capsule TAKE 1 CAPSULE BY MOUTH 2 TIMES A DAY FOR PAIN.  . fenofibrate micronized (LOFIBRA) 134 MG capsule TAKE 1 CAPSULE BY MOUTH DAILY BEFORE BREAKFAST  . ferrous sulfate 325 (65 FE) MG tablet Take 325 mg by mouth 2 (two) times daily with a meal.   . fluticasone (FLONASE) 50 MCG/ACT nasal spray USE TWO SPRAYS IN EACH NOSTRIL DAILY AS NEEDED  . gabapentin (NEURONTIN) 300 MG capsule TAKE ONE CAPSULE 3 TIMES A DAY  . levothyroxine (SYNTHROID, LEVOTHROID) 100 MCG tablet Take 100 mcg by mouth daily before breakfast.  . Linaclotide (LINZESS PO) Take by mouth daily.  . meclizine (ANTIVERT) 25 MG tablet TAKE 1 TABLET (25 MG TOTAL) BY MOUTH 3 (THREE) TIMES DAILY AS NEEDED FOR DIZZINESS.  .Marland Kitchenoxybutynin (DITROPAN) 5 MG tablet TAKE 1 TABLET TWICE A DAY  .  phentermine (ADIPEX-P) 37.5 MG tablet Take 1/2 to 1 tablet every morning for dieting & weightloss  . polyethylene glycol (MIRALAX / GLYCOLAX) packet Take 17 g by mouth daily as needed for mild constipation.  . ranitidine (ZANTAC) 300 MG tablet Take 1 tablet (300 mg total) by mouth at bedtime.  [name] rOPINIRole (REQUIP) 2 MG tablet TAKE 1 TABLET 4 TIMES A DAY FOR RESTLESS LEG SYNDROME  . sennosides-docusate sodium (SENOKOT-S) 8.6-50 MG tablet Take 2 tablets by mouth 2 (two) times daily.  [name] telmisartan (MICARDIS) 20 MG tablet Take 1 tablet (20 mg total) by mouth daily.  . Vitamin D, Ergocalciferol, (DRISDOL) 50000 units CAPS capsule TAKE 1 CAPSULE (50,000 UNITS TOTAL) BY MOUTH  DAILY.   No current facility-administered medications on file prior to visit.     Problem list She has Obstructive sleep apnea; RESTLESS LEG SYNDROME; Essential hypertension; Rhinitis; GERD; Crohn's Enterocolitis / Regional enteritis; Fibromyalgia; Gait disorder; Hyperlipidemia; T2_NIDDM; Hypothyroid; Depression, major, recurrent, in partial remission ([place]); Vitamin D deficiency; ASCVD; Medication management; Rotator cuff arthropathy; Morbid obesity (BMI 31.88) ; Spinal stenosis in cervical region; IBS (irritable colon syndrome); Fatigue; Sleep talking; B12 deficiency anemia; Periprosthetic supracondylar fracture of femur, left ; At risk for adverse drug event; Polypharmacy; and Closed nondisplaced subtrochanteric fracture of left femur ([place]) on their problem list.  Review of Systems See HPI multitude of symtoms    Objective:   Physical Exam  Constitutional: She is oriented to person, place, and time. She appears well-developed and well-nourished. No distress.  HENT:  Head: Normocephalic and atraumatic.  Eyes: Pupils are equal, round, and reactive to light. Conjunctivae are normal.  Neck: Normal range of motion. Neck supple.  Cardiovascular: Normal rate and regular rhythm.  Pulmonary/Chest: Effort normal and breath sounds normal.  Abdominal: Soft. Bowel sounds are normal. She exhibits no distension and no mass. There is no tenderness. There is no rebound and no guarding.  Musculoskeletal:  Patient is able to ambulate well.   Gait is antalgic. Straight leg raising with dorsiflexion negative bilaterally for radicular symptoms. Sensory exam in the legs is abnormal left leg Knee reflexes are decrease bilateral.  Ankle reflexes are decrease bilateral Strength is normal and symmetric in arms and legs. There is SI tenderness to palpation on the RIGHT.  There is paraspinal muscle spasm on the RIGHT.   There is not midline tenderness.   ROM of spine with normal but painful flexion, extension,  lateral range of motion to the right and left, and rotation to the right and left.   She has no swelling, warmth, tenderness in her calf, some pain to palpation right posterior knee.   Lymphadenopathy:    She has no cervical adenopathy.  Neurological: She is alert and oriented to person, place, and time. She displays abnormal reflex. She displays no atrophy and no tremor. A cranial nerve deficit (right facial hemiparesis unchanged, tongue minline) is present. She exhibits normal muscle tone. She displays no seizure activity. Coordination abnormal.  Skin: Skin is warm and dry.       Assessment & Plan:   Weakness of both lower extremities Suggest PT- will call with who she would like to see Check labs Will increase gabapentin from 3 a day to 1 AM, 1 supper and 2 bed time ? From back follow up with ortho, has OV with neuro -     Myasthenia gravis panel 2 -     Ambulatory referral to Physical Therapy  Pain of right lower extremity No swelling,  redness, no evidence of DVT + pain palpation right posterior knee- likely still a baker's cyst will follow up with ortho -     CBC with Differential/Platelet -     BASIC METABOLIC PANEL WITH GFR -     Hepatic function panel -     Ambulatory referral to Physical Therapy  Urinary frequency -     Urinalysis, Routine w reflex microscopic -     Urine Culture

## 2017-09-22 LAB — BASIC METABOLIC PANEL WITH GFR
BUN: 23 mg/dL (ref 7–25)
CO2: 33 mmol/L — ABNORMAL HIGH (ref 20–32)
Calcium: 10.4 mg/dL (ref 8.6–10.4)
Chloride: 102 mmol/L (ref 98–110)
Creat: 0.99 mg/dL (ref 0.50–0.99)
GFR, Est African American: 68 mL/min/{1.73_m2} (ref >=60)
GFR, Est Non African American: 59 mL/min/{1.73_m2} — ABNORMAL LOW (ref >=60)
Glucose, Bld: 132 mg/dL — ABNORMAL HIGH (ref 65–99)
Potassium: 5.1 mmol/L (ref 3.5–5.3)
Sodium: 143 mmol/L (ref 135–146)

## 2017-09-22 LAB — CBC WITH DIFFERENTIAL/PLATELET
Basophils Absolute: 109 cells/uL (ref 0–200)
Basophils Relative: 1.3 %
Eosinophils Absolute: 151 cells/uL (ref 15–500)
Eosinophils Relative: 1.8 %
HCT: 43 % (ref 35.0–45.0)
Hemoglobin: 14.7 g/dL (ref 11.7–15.5)
Lymphs Abs: 1663 cells/uL (ref 850–3900)
MCH: 30 pg (ref 27.0–33.0)
MCHC: 34.2 g/dL (ref 32.0–36.0)
MCV: 87.8 fL (ref 80.0–100.0)
MPV: 9.2 fL (ref 7.5–12.5)
Monocytes Relative: 8.5 %
Neutro Abs: 5762 cells/uL (ref 1500–7800)
Neutrophils Relative %: 68.6 %
Platelets: 344 10*3/uL (ref 140–400)
RBC: 4.9 10*6/uL (ref 3.80–5.10)
RDW: 12.1 % (ref 11.0–15.0)
Total Lymphocyte: 19.8 %
WBC mixed population: 714 cells/uL (ref 200–950)
WBC: 8.4 10*3/uL (ref 3.8–10.8)

## 2017-09-22 LAB — URINALYSIS, ROUTINE W REFLEX MICROSCOPIC
Bilirubin Urine: NEGATIVE
Glucose, UA: NEGATIVE
Hgb urine dipstick: NEGATIVE
Leukocytes, UA: NEGATIVE
Nitrite: NEGATIVE
Protein, ur: NEGATIVE
Specific Gravity, Urine: 1.03 (ref 1.001–1.03)
pH: <=5 (ref 5.0–8.0)

## 2017-09-22 LAB — MYASTHENIA GRAVIS PANEL 2
A CHR BINDING ABS: <0.3 nmol/L
ACHR Blocking Abs: <15 % Inhibition (ref <15)
Acetylchol Modul Ab: 9 % Inhibition

## 2017-09-22 LAB — URINE CULTURE
MICRO NUMBER:: 90358278
Result:: NO GROWTH
SPECIMEN QUALITY:: ADEQUATE

## 2017-09-22 LAB — HEPATIC FUNCTION PANEL
AG Ratio: 1.8 (calc) (ref 1.0–2.5)
ALT: 9 U/L (ref 6–29)
AST: 13 U/L (ref 10–35)
Albumin: 4.4 g/dL (ref 3.6–5.1)
Alkaline phosphatase (APISO): 54 U/L (ref 33–130)
Bilirubin, Direct: 0.1 mg/dL (ref 0.0–0.2)
Globulin: 2.4 g/dL (calc) (ref 1.9–3.7)
Indirect Bilirubin: 0.3 mg/dL (calc) (ref 0.2–1.2)
Total Bilirubin: 0.4 mg/dL (ref 0.2–1.2)
Total Protein: 6.8 g/dL (ref 6.1–8.1)

## 2017-09-29 ENCOUNTER — Ambulatory Visit: Payer: Medicare Other | Admitting: Neurology

## 2017-09-29 ENCOUNTER — Encounter: Payer: Self-pay | Admitting: Neurology

## 2017-09-29 VITALS — BP 134/73 | HR 103 | Ht 64.0 in | Wt 176.0 lb

## 2017-09-29 DIAGNOSIS — R269 Unspecified abnormalities of gait and mobility: Secondary | ICD-10-CM

## 2017-09-29 DIAGNOSIS — M79604 Pain in right leg: Secondary | ICD-10-CM | POA: Diagnosis not present

## 2017-09-29 DIAGNOSIS — M79605 Pain in left leg: Secondary | ICD-10-CM

## 2017-09-29 NOTE — Progress Notes (Signed)
Subjective:    Patient ID: Crystal [name] is a 67 y.o. female.  HPI     Interim history:   [name] is a 67 year old left-handed woman with a complex underlying medical history of hypertension, hyperlipidemia, Crohn's disease, hypothyroidism, obstructive sleep apnea, right-sided TIA in March 2005 (left-sided weakness and numbness in the face lasting 40 minutes), depression, and a history of right Bell's palsy, chronic back pain, fibromyalgia, RLS, interstitial cystitis and cervical and lumbar degenerative disc disease (s/p spine surgery of the lumbar spine under Dr. Vertell Limber x 2 and neck surgery x 1), who presents for nerve pain. She sees multiple specialists, including rheum, GI, neurosurgery, ortho. I have previously seen her for her gait disorder and falls. I last saw her on 08/28/2015.   Today, 09/29/2017: She reports having issues with a cyst behind the right knee. She has had a Fx in her femor and had surgery in September in 2018. She still has pain in both legs, she had foot surgery on both sides. She is status post bilateral knee replacement surgeries. She still has back pain and an appointment pending with Dr. Vertell Limber this month. She continues to be on multiple medications. She has had physical therapy. She had blood work recently through her primary care provider, on 08/25/2017, at which time she had unremarkable CK level, B12, TSH, ESR. She also additional blood work and urine testing on 09/17/2017 including negative myasthenia gravis panel.  She had a head CT without contrast on 09/09/2017 and I reviewed the results: IMPRESSION: Negative exam. No cause of the presenting symptoms is identified. Mild age related volume loss. Small heavily calcified meningioma of the falx not felt to be significant. See above discussion.   Previously:  She no showed for an appointment on 08/06/2015.   08/28/2015: She reports difficulty with her sleep including sleep maintenance, she has some snoring.  Her Epworth sleepiness score is 13 out of 24, her fatigue score is 57 out of 63. Of note, in the interim, she had ACDF under Dr. Vertell Limber on 06/14/2015. She went home, but was in pain had AMS. She had medication changes for pain. She then presented to the emergency room on 06/24/2015 for pain. She was admitted and was in the hospital from 06/24/15 and 06/26/15. I reviewed the ER records and hospital records including discharge summary. CT cervical spine showed stable findings. She was felt to be overmedicated and some medications were adjusted and changed. She started seeing Dr. Maryjean Ka. She takes no narcotic pain medication. She takes gabapentin 300 mg tid, and Cymbalta and Linzess. She is taking ropinirole 2 mg bid, 5 PM and BT, which is between 10 to 11 PM. She fell onto her right arm recently and had a bruise on her right arm. She is able to walk without a cane at this time. She overall is pleased with how her neck surgery has helped her. She has problems with both shoulders and may need shoulder surgeries in the near future. She has cataracts and has upcoming left-sided cataract surgery later in March 2017.   I saw her on 12/06/2014, at which time we talked about her BiPAP compliance. She was suboptimally compliant and was not able to tolerate it fully. She was advised to talk to her dentist about a dental appliance for potential treatment of her sleep apnea. She had previously been diagnosed with obstructive sleep apnea and placed on BiPAP therapy. She was diagnosed and placed on treatment before I first met her. She was  also advised to talk to her ear nose throat physician about surgical options for sleep apnea treatment. I had previously seen her for her gait disorder. I saw her on 08/07/2014, at which time she admitted that she was not fully compliant with her BiPAP. She had tolerance issues with the mask. I prescribed a new nose mask. She was also complaining of shoulder pain. I suggested that she continue  using her BiPAP regularly and provided her with an order for new supplies.  I reviewed her BiPAP compliance data from 07/08/2014 through 08/06/2014 which is a total of 30 days during which time she used her machine only 16 days with percent used days greater than 4 hours at only 47%, indicating suboptimal compliance with an average usage for all days of only 2 hours and 44 minutes, residual AHI low at 0.8, leak acceptable. Pressure setting of maximum IPAP of 19 cm, minimum EPAP of 5 cm and pressure support of 4 cm.    I saw her on 09/13/2013, at which time she reported no further TIA type symptoms. She had a stable gait. I suggested a prn follow-up. She requested to establish care for her obstructive sleep apnea as she no longer has a sleep specialist.    I reviewed today her compliance data from 05/09/2014 through 08/06/2014 which is a total of 90 days during which time she used her machine only 39 days with percent used days greater than 4 hours of 37%, indicating poor compliance. She is on AutoBiPAP with a maximum IPAP of 19, minimum EPAP of 5, pressure support of 4. Residual AHI is low at 0.9 per hour, leak acceptable with the 95th percentile at 13.3 L/m. Average usage for all days of only 2 hours and 16 minutes.    I saw her on 04/28/2013, at which time I felt her physical exam was stable and she was advised to get started on baby aspirin for possible recent TIA with transient right eye vision loss. I requested echocardiogram, carotid Doppler study and repeat brain MRI and referred her to ENT. She was encouraged to discuss her stress and depression symptoms with her primary care physician. She was also encouraged to restart psychotherapy. We talked about secondary stroke prevention and her multifactorial gait disorder. Her carotid Doppler study from 05/24/2013 was unremarkable. Her brain MRI without contrast from 05/11/2013 was reported as normal without change from January 2014. In addition I personally  reviewed the images through the PACS system. Her echocardiogram from 05/16/2013 showed an EF of 50-55%, mild mitral regurgitation, trivial tricuspid regurgitation, mild left atrial dilatation, and otherwise normal findings. We called her with all her test results.    I first met her on 10/26/2012, and which time I felt she had tremors as well as a multifactorial gait disorder and I suggested no new medications at the time but reinforced the need for treatment of her sleep apnea and we talked about general cardiovascular risk reduction. In 9/14, she had sudden onset of R eye vision loss for a about 40 minutes. She saw an eye doctor, and was told she may have glaucoma, and she has astigmatism. She has had orthostatic hypotension before. She endorsed stress and lost her son in Sep 10, 2010, her mother died in 2011/09/11, and her father died on 2013/05/02. She was tearful. She had counseling in the past. She had PT per ortho and per PCP. She has had vertigo.   She has previously been seen by Dr. Erling Cruz and was last seen by  him on 07/05/2012 for her history of balance difficulties. At the time of her last visit he suggested a repeat MRI and MRA of the brain. MRI and MRA of the brain did not show any acute or new abnormalities and changes were not much progressed from 2005.   She has had bilateral hand tremors and a family history of tremors and degenerative disc disease as well as obstructive sleep apnea on BiPAP and was on CPAP in the past, and is status post left knee surgery in 2010.   She fell in March 3/14 and went to [facility] and was Dx with food poisoning, and she had L leg pain and had X ray of the L leg. She has a history of dream enactments and was told that she has PLMs. She uses a nasal mask, but her residual R facial weakness makes it hard for her to use the mask and she is not closing her eye. She has been on ASA 81 mg since her last presumed TIA in 12/13. She has ongoing problems with her L shoulder and had 2 surgeries on  it.  Her Past Medical History Is Significant For: Past Medical History:  Diagnosis Date  . Arthritis   . ASCVD (arteriosclerotic cardiovascular disease)   . Depression   . Fibromyalgia   . Gait disorder 10/26/2012  . GERD (gastroesophageal reflux disease)   . GI bleed   . Hyperlipidemia   . Hypertension   . Hypothyroid   . Periprosthetic supracondylar fracture of femur, left  03/23/2017  . Restless leg syndrome   . Rhinitis 06/25/2010  . Sleep apnea   . TIA (transient ischemic attack)    3        . Vitamin D deficiency     Her Past Surgical History Is Significant For: Past Surgical History:  Procedure Laterality Date  . ABDOMINAL HYSTERECTOMY  1992  . ANTERIOR CERVICAL DECOMPRESSION/DISCECTOMY FUSION 4 LEVELS N/A 06/14/2015   Procedure: Cervical three-four Cervical four-five Cervical five- six Cervical six- seven  Anterior cervical decompression/diskectomy/fusion;  Surgeon: Erline Levine, MD;  Location: [facility];  Service: Neurosurgery;  Laterality: N/A;  C3-4 C4-5 C5-6 C6-7 Anterior cervical decompression/diskectomy/fusion  . APPENDECTOMY  1960  . [place]  . JOINT REPLACEMENT     2   LEFT  1 RIGHT   . KNEE SURGERY    . ORIF FEMUR FRACTURE Left 03/24/2017   Procedure: OPEN REDUCTION INTERNAL FIXATION (ORIF) FEMUR FRACTURE;  Surgeon: Altamese Caryville, MD;  Location: [facility];  Service: Orthopedics;  Laterality: Left;  . ROTATOR CUFF REPAIR Left 2005   2 LEFT  1  RIGHT  . SHOULDER ADHESION RELEASE    . SPINE SURGERY    . TOE SURGERY     LEFT       . TOE SURGERY     RIGHT  BIG TOE  . TONSILLECTOMY  1960    Her Family History Is Significant For: Family History  Problem Relation Age of Onset  . Leukemia Sister   . Lupus Sister   . Depression Sister   . Rheum arthritis Sister   . Heart disease Father   . Hypertension Father   . Hyperlipidemia Father   . Neuropathy Father   . Cancer Mother   . Diabetes Mother   . Osteoporosis Mother   . Breast  cancer Mother   . Migraines Daughter     Her Social History Is Significant For: Social History   Socioeconomic  History  . Marital status: Married    Spouse name: Not on file  . Number of children: 2  . Years of education: Not on file  . Highest education level: Not on file  Occupational History  . Occupation: Retired Games developer: DISABLED  Social Needs  . Financial resource strain: Not on file  . Food insecurity:    Worry: Not on file    Inability: Not on file  . Transportation needs:    Medical: Not on file    Non-medical: Not on file  Tobacco Use  . Smoking status: Never Smoker  . Smokeless tobacco: Never Used  Substance and Sexual Activity  . Alcohol use: Yes    Alcohol/week: 0.0 oz    Comment: rarely  . Drug use: No  . Sexual activity: Not on file  Lifestyle  . Physical activity:    Days per week: Not on file    Minutes per session: Not on file  . Stress: Not on file  Relationships  . Social connections:    Talks on phone: Not on file    Gets together: Not on file    Attends religious service: Not on file    Active member of club or organization: Not on file    Attends meetings of clubs or organizations: Not on file    Relationship status: Not on file  Other Topics Concern  . Not on file  Social History Narrative   Drinks about 1 cup of coffee a day, occasional coke zero     Her Allergies Are:  Allergies  Allergen Reactions  . Atorvastatin Other (See Comments)    Extreme pain  . Codeine Other (See Comments)    Headache  . Pravastatin Other (See Comments)    Exreme pain  . Statins Other (See Comments)    Extreme pain  . Amitriptyline Other (See Comments)    Unspecified  . Fish Oil Nausea Only  . Nuvigil [Armodafinil] Other (See Comments)    Unspecified  . Erythromycin Other (See Comments)    Reaction unspecified  . Erythromycin Base Rash  . Flax Seed [Bio-Flax] Rash    Linseed  . Hydrocodone-Acetaminophen Rash  . Morphine  Nausea And Vomiting and Rash  . Nsaids Nausea Only  . Sertraline Rash  . Sinemet [Carbidopa W-Levodopa] Rash  . Sulfamethoxazole Rash  :   Her Current Medications Are:  See list in chart. Could not pull in  Review of Systems:  Out of a complete 14 point review of systems, all are reviewed and negative with the exception of these symptoms as listed below:  Review of Systems  Neurological:       Pt presents today to discuss her osa, bell's palsy, and pain in her legs. Pt reports that she does not have a current sleep doctor because the pulmonologist that she was seeing is leaving the practice. Pt has not been using her cpap and did not bring it to be downloaded today. Pt reports that she has bell's palsy. Pt also reports having painful sensations in her legs.    Objective:  Neurological Exam  Physical Exam Physical Examination:   Vitals:   09/29/17 1144  BP: 134/73  Pulse: (!) 103   General Examination: The patient is a very pleasant 67 y.o. female in no acute distress. She appears well-developed and well-nourished and well groomed.   HEENT: Normocephalic, atraumatic, pupils are equal, round and reactive to light and accommodation. Extraocular tracking is fairly  good. Normal smooth pursuit is noted. Hearing is grossly intact. Face is asymmetric with chronic R facial weakness and mild R sided hemifacial spasm, both unchanged. Speech is clear with no dysarthria noted. There is no hypophonia. There is no lip, neck or jaw tremor. Neck is supple with full range of motion. Oropharynx exam reveals: moderate mouth dryness. Adequate dental hygiene. Tonsils are absent. Mallampati is class II. Tongue protrudes centrally and palate elevates symmetrically. Scar from neck surgery, unrem.  Chest: Clear to auscultation without wheezing, rhonchi or crackles noted.  Heart: S1+S2+0, regular and normal without murmurs, rubs or gallops noted.   Abdomen: Soft, non-tender and non-distended with normal  bowel sounds appreciated on auscultation.  Extremities: There is no pitting edema in the distal lower extremities bilaterally.   Skin: Warm and dry without trophic changes noted. There are no varicose veins.   Musculoskeletal: exam reveals no obvious joint deformities, tenderness or joint swelling or erythema, but changes c/w arthritis in her hands are noted, Scars from bilateral knee surgeries are unremarkable.    Neurologically:  Mental status: The patient is awake, alert and oriented in all 4 spheres. Her memory, attention, language and knowledge are appropriate. There is no aphasia, agnosia, apraxia or anomia. Speech is clear with normal prosody and enunciation. Thought process is linear. Mood is congruent and affect is normal.  Cranial nerves are as described above under HEENT exam. In addition, shoulder shrug is normal with equal shoulder height noted. Motor exam: Normal bulk, strength and tone is noted. There is no drift, or rebound. Reflexes are 1+ in the UE, trace in the LEs. Fine motor skills are fairly well preserved.   Cerebellar testing shows no dysmetria or intention tremor on finger to nose testing. Heel to shin is unremarkable bilaterally. There is no truncal or gait ataxia.   Sensory exam is intact to light touch, PP, temp and vibration in the UEs and LEs, reports being more sensitive to vibration and pinprick sensation in the distal lower extremities.  Gait, station and balance: cautious gait. No leaning to one side is noted. Posture is age-appropriate and stance is narrow based. No problems turning are noted.   Assessment and Plan:   In summary, FLOREEN TEEGARDEN is a very pleasant 67 year old female with a complex underlying medical history of hypertension, hyperlipidemia, Crohn's disease, hypothyroidism, obstructive sleep apnea, hx of TIA in '05 depression, right Bell's palsy and hemifacial spasms, chronic back pain, fibromyalgia, RLS, interstitial cystitis and  cervical and lumbar degenerative disc disease, arthritis with status post multiple surgeries including surgery for femur fracture last year, status post lower back and cervical spine surgeries, bilateral knee replacement surgeries, who presents for re-evaluation of her gait disorder and her falls. She complains of leg pain. She complains of ongoing issues with back pain and pain behind her right knee secondary to a cyst.she has a largely nonfocal neurological exam with the exception of stable findings with respect to her Bell's palsy, she has a nonspecific gait disorder, no telltale findings concerning for peripheral neuropathy. She recalls having had an EMG and nerve conduction test in the past when she was still seeing Dr. Erling Cruz. She reports that she was told that there was pinched nerve issues from the back. Unfortunately, there is not a whole lot I can add at this point. I explained to her that her gait disorder is largely secondary to multiple factors including suboptimal hydration, having fallen before, degenerative spine disease, degenerative arthritis with status post multiple surgeries  including joint replacement surgeries, residual pain from her back and neck, pain in her right knee from a cyst, taking multiple medications including potentially sedating medications. She is advised to continue with physical therapy, she is advised to stay better hydrated with water and use her walker for gait safety. She is agreeable to pursuing another EMG and nerve conduction test in our office. We will keep her posted as to her test results. She is advised to follow-up with her spine specialist and her other specialist, as well as her primary care physician. I spent 25 minutes in total face-to-face time with the patient, more than 50% of which was spent in counseling and coordination of care, reviewing test results, reviewing medication and discussing or reviewing the diagnosis of gait d/o, its prognosis and treatment  options. Pertinent laboratory and imaging test results that were available during this visit with the patient were reviewed by me and considered in my medical decision making (see chart for details).

## 2017-09-29 NOTE — Patient Instructions (Addendum)
Unfortunately, there is not much I can offer you for your leg pain. We can do an EMG and nerve conduction velocity test, which is an electrical nerve and muscle test, which we will schedule. We will call you with the results. As discussed, you have several reasons to be off balance and have leg pain: degenerative arthritis, having had multiple spine and joint surgeries, Femur fracture, cyst behind right knee, suboptimal hydration, taking multiple medications, having fallen before.  Please stay better hydrated with water, use your walker for safety.

## 2017-10-01 ENCOUNTER — Other Ambulatory Visit: Payer: Medicare Other

## 2017-10-01 ENCOUNTER — Ambulatory Visit (INDEPENDENT_AMBULATORY_CARE_PROVIDER_SITE_OTHER)
Admission: RE | Admit: 2017-10-01 | Discharge: 2017-10-01 | Disposition: A | Discharge status: Home / Self Care | Payer: Medicare Other | Source: Ambulatory Visit | Attending: Acute Care | Admitting: Acute Care

## 2017-10-01 DIAGNOSIS — R059 Cough, unspecified: Secondary | ICD-10-CM

## 2017-10-01 DIAGNOSIS — R05 Cough: Secondary | ICD-10-CM

## 2017-10-01 DIAGNOSIS — R911 Solitary pulmonary nodule: Secondary | ICD-10-CM

## 2017-10-02 ENCOUNTER — Other Ambulatory Visit: Payer: Self-pay | Admitting: Internal Medicine

## 2017-10-02 DIAGNOSIS — H6061 Unspecified chronic otitis externa, right ear: Secondary | ICD-10-CM

## 2017-10-02 DIAGNOSIS — N3281 Overactive bladder: Secondary | ICD-10-CM

## 2017-10-05 NOTE — Patient Instructions (Addendum)

## 2017-10-05 NOTE — Progress Notes (Signed)
[place] [facility] [name], M.D.     Dyanne Carrel. Steffanie Dunn, P.A.-C Judd Gaudier, DNP [facility] [address] [place], [place]. [address] Telephone [phone] Telefax [phone] Annual Screening/Preventative Visit & Comprehensive Evaluation &  Examination     This very nice 67 y.o. [name]  presents for a Screening/Preventative Visit & comprehensive evaluation and management of multiple medical co-morbidities.  Patient has been followed for HTN, HLD, Prediabetes, GERD, Hypothyroidism, Crohn's Dz, OSA/BiPAP and Vitamin D Deficiency. Patient has significant hx/o #23 previous surgeries. She has a Chronic Pain Syndrome consequent of multiple Neck & back surgeries & is on Cymbalta/Gabapentin to avoid chronic Opioids. Patient's GERD is controlled on current meds. 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  Patient has hx/o OSA & does not use her CPAP/BiPAP.       HTN predates circa 2006. Patient's BP has been controlled at home and patient denies any cardiac symptoms as chest pain, palpitations, shortness of breath, dizziness or ankle swelling. Today's BP is at goal - 117/76.     Patient's hyperlipidemia is controlled with diet and medications. Patient denies myalgias or other medication SE's. Last lipids were at goal: Lab Results  Component Value Date   CHOL 166 10/06/2017   HDL 43 (L) 10/06/2017   LDLCALC 95 10/06/2017   TRIG 180 (H) 10/06/2017   CHOLHDL 3.9 10/06/2017      Patient has  Morbid Obesity (BMI 30+)  and diet controlled T2_DM (2012) with CKD3 (GFR 59)  and patient denies reactive hypoglycemic symptoms, visual blurring, diabetic polys, or paresthesias.  A1c 6.7% in Dec 2016 and last A1c was near goal and back in the preDiabetic range.  Lab Results  Component Value Date   HGBA1C 5.9 (H) 01/06/2017      Finally, patient has history of Vitamin D Deficiency ("33"/2008) and last Vitamin D was  at goal: Lab Results  Component Value Date   VD25OH 65.8 03/26/2017    Allergies  Allergen Reactions  . Atorvastatin Other (See Comments)    Extreme pain  . Codeine Other (See Comments)    Headache  . Pravastatin Other (See Comments)    Exreme pain  . Statins Other (See Comments)    Extreme pain  . Amitriptyline Other (See Comments)    Unspecified  . Fish Oil Nausea Only  . Nuvigil [Armodafinil] Other (See Comments)    Unspecified  . Erythromycin Other (See Comments)    Reaction unspecified  . Erythromycin Base Rash  . Flax Seed [Bio-Flax] Rash    Linseed  . Hydrocodone-Acetaminophen Rash  . Morphine Nausea And Vomiting and Rash  . Nsaids Nausea Only  . Sertraline Rash  . Sinemet [Carbidopa W-Levodopa] Rash  . Sulfamethoxazole Rash   Past Medical History:  Diagnosis Date  . Arthritis   . ASCVD (arteriosclerotic cardiovascular disease)   . Depression   . Fibromyalgia   . Gait disorder 10/26/2012  . GERD (gastroesophageal reflux disease)   . GI bleed   . Hyperlipidemia   . Hypertension   . Hypothyroid   . Periprosthetic supracondylar fracture of femur, left  03/23/2017  . Restless leg syndrome   . Rhinitis 06/25/2010  . Sleep apnea   . TIA (transient ischemic attack)    3        . Vitamin D deficiency    Health Maintenance  Topic Date Due  . HEMOGLOBIN A1C  07/09/2017  . FOOT EXAM  09/08/2017  .  OPHTHALMOLOGY EXAM  12/10/2017  . INFLUENZA VACCINE  01/28/2018  . COLONOSCOPY  05/16/2018  . MAMMOGRAM  10/03/2018  . TETANUS/TDAP  01/02/2025  . DEXA SCAN  Completed  . Hepatitis C Screening  Completed  . PNA vac Low Risk Adult  Completed   Immunization History  Administered Date(s) Administered  . DT 01/03/2015  . Influenza Whole 03/30/2010  . Influenza, High Dose Seasonal PF 03/13/2015  . Influenza-Unspecified 03/31/2011, 05/14/2014, 02/27/2016  . Pneumococcal Conjugate-13 04/17/2015  . Pneumococcal Polysaccharide-23 01/06/2017  .  Pneumococcal-Unspecified 06/30/2005  . Td 06/30/2004  . Zoster 07/01/2007   Last Colon - Oct 2009 - Dr Kinnie Scales and 10 yr f/u due Nov 2019  Last [facility] - 04.05.2018 and scheduled for next week  Past Surgical History:  Procedure Laterality Date  . ABDOMINAL HYSTERECTOMY  1992  . ANTERIOR CERVICAL DECOMPRESSION/DISCECTOMY FUSION 4 LEVELS N/A 06/14/2015   Procedure: Cervical three-four Cervical four-five Cervical five- six Cervical six- seven  Anterior cervical decompression/diskectomy/fusion;  Surgeon: Maeola Harman, MD;  Location: [facility];  Service: Neurosurgery;  Laterality: N/A;  C3-4 C4-5 C5-6 C6-7 Anterior cervical decompression/diskectomy/fusion  . APPENDECTOMY  1960  . CESAREAN SECTION  1983 & 1886  . JOINT REPLACEMENT     2   LEFT  1 RIGHT   . KNEE SURGERY    . ORIF FEMUR FRACTURE Left 03/24/2017   Procedure: OPEN REDUCTION INTERNAL FIXATION (ORIF) FEMUR FRACTURE;  Surgeon: Myrene Galas, MD;  Location: [facility] OR;  Service: Orthopedics;  Laterality: Left;  . ROTATOR CUFF REPAIR Left 2005   2 LEFT  1  RIGHT  . SHOULDER ADHESION RELEASE    . SPINE SURGERY    . TOE SURGERY     LEFT       . TOE SURGERY     RIGHT  BIG TOE  . TONSILLECTOMY  1960   Family History  Problem Relation Age of Onset  . Leukemia Sister   . Lupus Sister   . Depression Sister   . Rheum arthritis Sister   . Heart disease Father   . Hypertension Father   . Hyperlipidemia Father   . Neuropathy Father   . Cancer Mother   . Diabetes Mother   . Osteoporosis Mother   . Breast cancer Mother   . Migraines Daughter    Social History   Tobacco Use  . Smoking status: Never Smoker  . Smokeless tobacco: Never Used  Substance Use Topics  . Alcohol use: Yes    Alcohol/week: 0.0 oz    Comment: rarely  . Drug use: No    ROS Constitutional: Denies fever, chills, weight loss/gain, headaches, insomnia,  night sweats, and change in appetite. Does c/o fatigue. Eyes: Denies redness, blurred vision, diplopia,  discharge, itchy, watery eyes.  ENT: Denies discharge, congestion, post nasal drip, epistaxis, sore throat, earache, hearing loss, dental pain, Tinnitus, Vertigo, Sinus pain, snoring.  Cardio: Denies chest pain, palpitations, irregular heartbeat, syncope, dyspnea, diaphoresis, orthopnea, PND, claudication, edema Respiratory: denies cough, dyspnea, DOE, pleurisy, hoarseness, laryngitis, wheezing.  Gastrointestinal: Denies dysphagia, heartburn, reflux, water brash, pain, cramps, nausea, vomiting, bloating, diarrhea, constipation, hematemesis, melena, hematochezia, jaundice, hemorrhoids Genitourinary: Denies dysuria, frequency, urgency, nocturia, hesitancy, discharge, hematuria, flank pain Breast: Breast lumps, nipple discharge, bleeding.  Musculoskeletal: Denies arthralgia, myalgia, stiffness, Jt. Swelling, pain, limp, and strain/sprain. Denies falls. Skin: Denies puritis, rash, hives, warts, acne, eczema, changing in skin lesion Neuro: No weakness, tremor, incoordination, spasms, paresthesia, pain Psychiatric: Denies confusion, memory loss, sensory loss. Denies Depression.  Endocrine: Denies change in weight, skin, hair change, nocturia, and paresthesia, diabetic polys, visual blurring, hyper / hypo glycemic episodes.  Heme/Lymph: No excessive bleeding, bruising, enlarged lymph nodes.  Physical Exam  BP 117/76   Pulse 91   Temp 97.7 F (36.5 C)   Resp 16   Ht 5\' 3"  (1.6 m)   Wt 178 lb (80.7 kg)   BMI 31.53 kg/m   Postural Sitting     BP 117/76   P 91   &     Standing     BP 120/76   P 90  General Appearance: Over nourished, well groomed and in no apparent distress. Lt hemi-facial paresis.   Eyes: PERRLA, EOMs, conjunctiva no swelling or erythema, normal fundi and vessels. Sinuses: No frontal/maxillary tenderness ENT/Mouth: EACs patent / TMs  nl. Nares clear without erythema, swelling, mucoid exudates. Oral hygiene is good. No erythema, swelling, or exudate. Tongue normal,  non-obstructing. Tonsils not swollen or erythematous. Hearing normal.  Neck: Supple, thyroid not palpable. No bruits, nodes or JVD. Respiratory: Respiratory effort normal.  BS equal and clear bilateral without rales, rhonci, wheezing or stridor. Cardio: Heart sounds are normal with regular rate and rhythm and no murmurs, rubs or gallops. Peripheral pulses are normal and equal bilaterally without edema. No aortic or femoral bruits. Chest: symmetric with normal excursions and percussion. Breasts: Symmetric, without lumps, nipple discharge, retractions, or fibrocystic changes.  Abdomen: Flat, soft with bowel sounds active. Nontender, no guarding, rebound, hernias, masses, or organomegaly.  Lymphatics: Non tender without lymphadenopathy.  Musculoskeletal: Full ROM all peripheral extremities, joint stability, 5/5 strength, and normal gait. Skin: Warm and dry without rashes, lesions, cyanosis, clubbing or  ecchymosis.  Neuro: Cranial nerves intact, reflexes equal bilaterally. Normal muscle tone, no cerebellar symptoms. Sensation intact.  Pysch: Alert and oriented X 3, normal affect, Insight and Judgment appropriate.   Assessment and Plan  1. Annual Preventative Screening Examination  2. Essential hypertension  - EKG 12-Lead - Urinalysis, Routine w reflex microscopic - Microalbumin / creatinine urine ratio - BASIC METABOLIC PANEL WITH GFR - Magnesium - TSH  3. Hyperlipidemia, mixed  - EKG 12-Lead - Lipid panel - TSH  4. Type 2 diabetes mellitus with stage 3 chronic kidney disease, without long-term current use of insulin (HCC)  - EKG 12-Lead - Urinalysis, Routine w reflex microscopic - Microalbumin / creatinine urine ratio - BASIC METABOLIC PANEL WITH GFR - Hemoglobin A1c - Insulin, random  5. Vitamin D deficiency  - VITAMIN D 25 Hydroxyl  6. Gastroesophageal reflux disease   7. Obstructive sleep apnea, hx  - Intolerant of Mask & not using  8. Hypothyroidism  - TSH  9.  Depression, major, recurrent, in remission (HCC)   10. Crohn's disease, hx (HCC)   11. Screening for colorectal cancer  - POC Hemoccult Bld/Stl  12. Screening for ischemic heart disease  - EKG 12-Lead  13. Medication management  - Urinalysis, Routine w reflex microscopic - Microalbumin / creatinine urine ratio - BASIC METABOLIC PANEL WITH GFR - Magnesium - Lipid panel - TSH - Hemoglobin A1c - Insulin, random - VITAMIN D 25 Hydroxyl          Patient was counseled in prudent diet to achieve/maintain BMI less than 25 for weight control, BP monitoring, regular exercise and medications. Discussed med's effects and SE's. Screening labs and tests as requested with regular follow-up as recommended. Over 40 minutes of exam, counseling, chart review and high complex critical decision making was performed.

## 2017-10-06 ENCOUNTER — Ambulatory Visit: Payer: Medicare Other | Admitting: Internal Medicine

## 2017-10-06 ENCOUNTER — Encounter: Payer: Self-pay | Admitting: Internal Medicine

## 2017-10-06 VITALS — BP 117/76 | HR 91 | Temp 97.7°F | Resp 16 | Ht 63.0 in | Wt 178.0 lb

## 2017-10-06 DIAGNOSIS — F3341 Major depressive disorder, recurrent, in partial remission: Secondary | ICD-10-CM

## 2017-10-06 DIAGNOSIS — Z0001 Encounter for general adult medical examination with abnormal findings: Secondary | ICD-10-CM

## 2017-10-06 DIAGNOSIS — N183 Chronic kidney disease, stage 3 unspecified: Secondary | ICD-10-CM

## 2017-10-06 DIAGNOSIS — E559 Vitamin D deficiency, unspecified: Secondary | ICD-10-CM

## 2017-10-06 DIAGNOSIS — E1122 Type 2 diabetes mellitus with diabetic chronic kidney disease: Secondary | ICD-10-CM

## 2017-10-06 DIAGNOSIS — Z136 Encounter for screening for cardiovascular disorders: Secondary | ICD-10-CM

## 2017-10-06 DIAGNOSIS — I10 Essential (primary) hypertension: Secondary | ICD-10-CM

## 2017-10-06 DIAGNOSIS — Z1211 Encounter for screening for malignant neoplasm of colon: Secondary | ICD-10-CM

## 2017-10-06 DIAGNOSIS — E039 Hypothyroidism, unspecified: Secondary | ICD-10-CM

## 2017-10-06 DIAGNOSIS — Z1212 Encounter for screening for malignant neoplasm of rectum: Secondary | ICD-10-CM

## 2017-10-06 DIAGNOSIS — K219 Gastro-esophageal reflux disease without esophagitis: Secondary | ICD-10-CM

## 2017-10-06 DIAGNOSIS — Z79899 Other long term (current) drug therapy: Secondary | ICD-10-CM

## 2017-10-06 DIAGNOSIS — K50919 Crohn's disease, unspecified, with unspecified complications: Secondary | ICD-10-CM

## 2017-10-06 DIAGNOSIS — G4733 Obstructive sleep apnea (adult) (pediatric): Secondary | ICD-10-CM

## 2017-10-06 DIAGNOSIS — E782 Mixed hyperlipidemia: Secondary | ICD-10-CM

## 2017-10-07 ENCOUNTER — Telehealth: Payer: Self-pay | Admitting: *Deleted

## 2017-10-07 LAB — URINALYSIS, ROUTINE W REFLEX MICROSCOPIC
Bacteria, UA: NONE SEEN /HPF
Bilirubin Urine: NEGATIVE
Glucose, UA: NEGATIVE
Hgb urine dipstick: NEGATIVE
Hyaline Cast: NONE SEEN /LPF
Ketones, ur: NEGATIVE
Nitrite: NEGATIVE
Protein, ur: NEGATIVE
RBC / HPF: NONE SEEN /HPF (ref 0–2)
Specific Gravity, Urine: 1.022 (ref 1.001–1.03)
pH: 6.5 (ref 5.0–8.0)

## 2017-10-07 LAB — LIPID PANEL
Cholesterol: 166 mg/dL (ref <200)
HDL: 43 mg/dL — ABNORMAL LOW (ref >50)
LDL Cholesterol (Calc): 95 mg/dL (calc)
Non-HDL Cholesterol (Calc): 123 mg/dL (calc) (ref <130)
Total CHOL/HDL Ratio: 3.9 (calc) (ref <5.0)
Triglycerides: 180 mg/dL — ABNORMAL HIGH (ref <150)

## 2017-10-07 LAB — HEMOGLOBIN A1C
Hgb A1c MFr Bld: 5.9 % of total Hgb — ABNORMAL HIGH (ref <5.7)
Mean Plasma Glucose: 123 (calc)
eAG (mmol/L): 6.8 (calc)

## 2017-10-07 LAB — TSH: TSH: 1.97 mIU/L (ref 0.40–4.50)

## 2017-10-07 LAB — CBC WITH DIFFERENTIAL/PLATELET
Basophils Absolute: 101 cells/uL (ref 0–200)
Basophils Relative: 1.4 %
Eosinophils Absolute: 187 cells/uL (ref 15–500)
Eosinophils Relative: 2.6 %
HCT: 42.4 % (ref 35.0–45.0)
Hemoglobin: 13.9 g/dL (ref 11.7–15.5)
Lymphs Abs: 1339 cells/uL (ref 850–3900)
MCH: 29.1 pg (ref 27.0–33.0)
MCHC: 32.8 g/dL (ref 32.0–36.0)
MCV: 88.9 fL (ref 80.0–100.0)
MPV: 9.1 fL (ref 7.5–12.5)
Monocytes Relative: 8.4 %
Neutro Abs: 4968 cells/uL (ref 1500–7800)
Neutrophils Relative %: 69 %
Platelets: 341 10*3/uL (ref 140–400)
RBC: 4.77 10*6/uL (ref 3.80–5.10)
RDW: 12.4 % (ref 11.0–15.0)
Total Lymphocyte: 18.6 %
WBC mixed population: 605 cells/uL (ref 200–950)
WBC: 7.2 10*3/uL (ref 3.8–10.8)

## 2017-10-07 LAB — HEPATIC FUNCTION PANEL
AG Ratio: 1.8 (calc) (ref 1.0–2.5)
ALT: 9 U/L (ref 6–29)
AST: 14 U/L (ref 10–35)
Albumin: 4.5 g/dL (ref 3.6–5.1)
Alkaline phosphatase (APISO): 53 U/L (ref 33–130)
Bilirubin, Direct: 0.1 mg/dL (ref 0.0–0.2)
Globulin: 2.5 g/dL (calc) (ref 1.9–3.7)
Indirect Bilirubin: 0.4 mg/dL (calc) (ref 0.2–1.2)
Total Bilirubin: 0.5 mg/dL (ref 0.2–1.2)
Total Protein: 7 g/dL (ref 6.1–8.1)

## 2017-10-07 LAB — VITAMIN D 25 HYDROXY (VIT D DEFICIENCY, FRACTURES): Vit D, 25-Hydroxy: 120 ng/mL — ABNORMAL HIGH (ref 30–100)

## 2017-10-07 LAB — INSULIN, RANDOM: Insulin: 49.4 u[IU]/mL — ABNORMAL HIGH (ref 2.0–19.6)

## 2017-10-07 LAB — MICROALBUMIN / CREATININE URINE RATIO
Creatinine, Urine: 135 mg/dL (ref 20–275)
Microalb Creat Ratio: 14 mcg/mg creat (ref <30)
Microalb, Ur: 1.9 mg/dL

## 2017-10-07 LAB — BASIC METABOLIC PANEL WITH GFR
BUN/Creatinine Ratio: 19 (calc) (ref 6–22)
BUN: 19 mg/dL (ref 7–25)
CO2: 32 mmol/L (ref 20–32)
Calcium: 9.8 mg/dL (ref 8.6–10.4)
Chloride: 99 mmol/L (ref 98–110)
Creat: 1.01 mg/dL — ABNORMAL HIGH (ref 0.50–0.99)
GFR, Est African American: 66 mL/min/{1.73_m2} (ref >=60)
GFR, Est Non African American: 57 mL/min/{1.73_m2} — ABNORMAL LOW (ref >=60)
Glucose, Bld: 135 mg/dL — ABNORMAL HIGH (ref 65–99)
Potassium: 3.9 mmol/L (ref 3.5–5.3)
Sodium: 141 mmol/L (ref 135–146)

## 2017-10-07 LAB — MAGNESIUM: Magnesium: 2.2 mg/dL (ref 1.5–2.5)

## 2017-10-07 NOTE — Telephone Encounter (Signed)
Patient called and reported she is a yeast infection around her mouth. Per Dr Oneta Rack, the patient can try OTC antifungal cream and apply the cream 3-4 times a day.

## 2017-10-08 ENCOUNTER — Encounter: Payer: Self-pay | Admitting: Adult Health

## 2017-10-08 ENCOUNTER — Ambulatory Visit: Payer: Medicare Other | Admitting: Adult Health

## 2017-10-08 VITALS — BP 106/70 | HR 89 | Temp 97.5°F

## 2017-10-08 DIAGNOSIS — K130 Diseases of lips: Secondary | ICD-10-CM

## 2017-10-08 MED ORDER — KETOCONAZOLE 2 % EX CREA: 1.0000 "application " | TOPICAL_CREAM | Freq: Two times a day (BID) | CUTANEOUS | 0 refills | Status: DC

## 2017-10-08 NOTE — Progress Notes (Signed)
Assessment and Plan:  Crystal [name] was seen today for mouth lesions.  Diagnoses and all orders for this visit:  Angular cheilitis Advised to stop cosmetic lip products and plumping agents; apply petrolatum jelly liberally to maintain moisture, apply liberally to skin surrounding mouth at night as barrier to salivary contact related to drooling from bells palsy. After reviewing therapy options, it appears antifungal has never been tried for this. If this is ineffective, may try alternate oral antifungal or antibiotic agent with MRSA coverage.    Follow up as needed. May refer back to derm if not improving.   -     ketoconazole (NIZORAL) 2 % cream; Apply 1 application topically 2 (two) times daily.  Further disposition pending results of labs. Discussed med's effects and SE's.   Over 15 minutes of exam, counseling, chart review, and critical decision making was performed.   Future Appointments  Date Time Provider Department Center  10/12/2017 11:00 AM Bevelyn Ngo, NP [facility] None  10/27/2017  1:45 PM Gearldine Shown [facility] None  10/27/2017  2:30 PM Sater, Pearletha Furl, MD [facility] None  01/22/2018 11:15 AM Judd Gaudier, NP [facility] None  04/28/2018 11:15 AM [name], MD [facility] None  11/08/2018 10:00 AM [name], MD [facility] None    ------------------------------------------------------------------------------------------------------------------   HPI BP 106/70   Pulse 89   Temp (!) 97.5 F (36.4 C)   SpO2 97%   67 y.o.female with hx of T2DM and bells palsy presents for concerns of ongoing dry cracked areas at the side of her mouth which are sore and reportedly occasionally develop lesions with discharge. Presents today with very dry/cracked lips and injected/cracked angles of mouth. She has reportedly been on multiple previous treatments for this, though cannot recall what has been tried. She reports using petrolatum gelly as a lip moisturizing agent, but also  regularly uses cosmetic agents as well as  tretinoin 0.1% cream, as well as multiple lip plumping creams. She has been prescribed mupirocin topical by dermatology (also endorses hx of MRSA) but reports this did not significantly improve/resolve recurrent symptoms. She also endorses intermittent white patchy oral lesions, though currently does not have any.    Past Medical History:  Diagnosis Date  . Arthritis   . ASCVD (arteriosclerotic cardiovascular disease)   . Depression   . Fibromyalgia   . Gait disorder 10/26/2012  . GERD (gastroesophageal reflux disease)   . GI bleed   . Hyperlipidemia   . Hypertension   . Hypothyroid   . Periprosthetic supracondylar fracture of femur, left  03/23/2017  . Restless leg syndrome   . Rhinitis 06/25/2010  . Sleep apnea   . TIA (transient ischemic attack)    3        . Vitamin D deficiency      Allergies  Allergen Reactions  . Atorvastatin Other (See Comments)    Extreme pain  . Codeine Other (See Comments)    Headache  . Pravastatin Other (See Comments)    Exreme pain  . Statins Other (See Comments)    Extreme pain  . Amitriptyline Other (See Comments)    Unspecified  . Fish Oil Nausea Only  . Nuvigil [Armodafinil] Other (See Comments)    Unspecified  . Erythromycin Other (See Comments)    Reaction unspecified  . Erythromycin Base Rash  . Flax Seed [Bio-Flax] Rash    Linseed  . Hydrocodone-Acetaminophen Rash  . Morphine Nausea And Vomiting and Rash  . Nsaids Nausea Only  . Sertraline Rash  .  Sinemet [Carbidopa W-Levodopa] Rash  . Sulfamethoxazole Rash    Current Outpatient Medications on File Prior to Visit  Medication Sig  . aspirin EC 81 MG tablet Take 81 mg by mouth daily.  . bisoprolol-hydrochlorothiazide (ZIAC) 10-6.25 MG tablet TAKE ONE TABLET BY MOUTH ONE TIME DAILY FOR BLOOD PRESSURE FILL 07/05/2015 (Patient taking differently: TAKE 1/2 TABLET BY MOUTH ONE TIME DAILY FOR BLOOD PRESSURE)  . buPROPion (WELLBUTRIN XL) 300  MG 24 hr tablet TAKE 1 TABLET BY MOUTH EVERY MORNING FOR MOOD AND FOCUS ATTENTION  . CIPRODEX OTIC suspension PLACE 4 DROPS INTO THE RIGHT EAR 2 (TWO) TIMES DAILY.  . cyclobenzaprine (FLEXERIL) 5 MG tablet TAKE 1 TABLET BY MOUTH 2 TIMES DAILY AS NEEDED FOR MUSCLE SPASMS. (Patient taking differently: TAKE 1 TABLET BY MOUTH 1 TIME DAILY AS NEEDED FOR MUSCLE SPASMS.)  . diclofenac sodium (VOLTAREN) 1 % GEL Apply 2 g topically 2 (two) times daily as needed.   . DULoxetine (CYMBALTA) 60 MG capsule TAKE 1 CAPSULE BY MOUTH 2 TIMES A DAY FOR PAIN.  . fenofibrate micronized (LOFIBRA) 134 MG capsule TAKE 1 CAPSULE BY MOUTH DAILY BEFORE BREAKFAST  . ferrous sulfate 325 (65 FE) MG tablet Take 325 mg by mouth daily.   . fluticasone (FLONASE) 50 MCG/ACT nasal spray USE TWO SPRAYS IN EACH NOSTRIL DAILY AS NEEDED  . gabapentin (NEURONTIN) 300 MG capsule TAKE ONE CAPSULE 3 TIMES A DAY  . levothyroxine (SYNTHROID, LEVOTHROID) 100 MCG tablet Take 100 mcg by mouth daily before breakfast.  . Linaclotide (LINZESS PO) Take by mouth daily.  . meclizine (ANTIVERT) 25 MG tablet TAKE 1 TABLET (25 MG TOTAL) BY MOUTH 3 (THREE) TIMES DAILY AS NEEDED FOR DIZZINESS.  [name] oxybutynin (DITROPAN) 5 MG tablet TAKE 1 TABLET BY MOUTH TWICE A DAY  . phentermine (ADIPEX-P) 37.5 MG tablet Take 1/2 to 1 tablet every morning for dieting & weightloss  . polyethylene glycol (MIRALAX / GLYCOLAX) packet Take 17 g by mouth daily as needed for mild constipation.  . ranitidine (ZANTAC) 300 MG tablet Take 1 tablet (300 mg total) by mouth at bedtime.  [name] rOPINIRole (REQUIP) 2 MG tablet TAKE 1 TABLET 4 TIMES A DAY FOR RESTLESS LEG SYNDROME (Patient taking differently: TAKE 1 TABLET 3TIMES A DAY FOR RESTLESS LEG SYNDROME)  . telmisartan (MICARDIS) 20 MG tablet Take 1 tablet (20 mg total) by mouth daily.  . Vitamin D, Ergocalciferol, (DRISDOL) 50000 units CAPS capsule TAKE 1 CAPSULE (50,000 UNITS TOTAL) BY MOUTH DAILY.  [name] Cholecalciferol (VITAMIN D3)  50000 units CAPS Take 1 capsule by mouth daily.    No current facility-administered medications on file prior to visit.     ROS: all negative except above.   Physical Exam:  BP 106/70   Pulse 89   Temp (!) 97.5 F (36.4 C)   SpO2 97%   General Appearance: Well nourished, in no apparent distress. Eyes: PERRLA, EOMs, conjunctiva no swelling or erythema ENT/Mouth:  No erythema, swelling, or exudate on post pharynx.  Tonsils not swollen or erythematous. Hearing normal. Lips very dry and flaky, angles of mouth appear injected/raw with cracks. Single injected/open lesion without notable drainage to chin. No oral lesions/patches/plaques at this time.  Neck: Supple.  Respiratory: Respiratory effort normal, BS equal bilaterally without rales, rhonchi, wheezing or stridor.  Cardio: RRR with no MRGs.   Lymphatics: Non tender without lymphadenopathy.  Musculoskeletal: normal gait.  Psych: Awake and oriented X 3, normal affect, Insight and Judgment appropriate.    Carlyon Shadow  Laurence Aly, NP 5:44 PM [facility]

## 2017-10-09 ENCOUNTER — Encounter: Payer: Self-pay | Admitting: Adult Health

## 2017-10-12 ENCOUNTER — Encounter: Payer: Self-pay | Admitting: Acute Care

## 2017-10-12 ENCOUNTER — Ambulatory Visit: Payer: Medicare Other | Admitting: Acute Care

## 2017-10-12 VITALS — BP 122/80 | HR 93 | Ht 63.5 in | Wt 174.0 lb

## 2017-10-12 DIAGNOSIS — K5790 Diverticulosis of intestine, part unspecified, without perforation or abscess without bleeding: Secondary | ICD-10-CM | POA: Insufficient documentation

## 2017-10-12 DIAGNOSIS — J301 Allergic rhinitis due to pollen: Secondary | ICD-10-CM

## 2017-10-12 DIAGNOSIS — G4733 Obstructive sleep apnea (adult) (pediatric): Secondary | ICD-10-CM

## 2017-10-12 DIAGNOSIS — J479 Bronchiectasis, uncomplicated: Secondary | ICD-10-CM | POA: Diagnosis not present

## 2017-10-12 DIAGNOSIS — K648 Other hemorrhoids: Secondary | ICD-10-CM | POA: Insufficient documentation

## 2017-10-12 NOTE — Progress Notes (Signed)
History of Present Illness Crystal [name] is a 67 y.o. female never smoker with OSA and pulmonary nodule surveillance . CT Chest 09/2017 indicated mild bronchiectasis.She had Crystal [name] as a child.She has Crohns and GERD . She Uses PPI and zantac daily. She has had TIA x 3. Pt.  was previously followed by Dr. Corrie Dandy.   10/12/2017  Pt was last seen in the office 09/2016 by Dr. Corrie Dandy. At that time she was using  CPAP at 11 cm H2O.  CPAP titration done 04/2016 indicated  optimal CPAP therapy for her was 11 cm water. She uses CPAP 11 cm water. 08/2016 Down Load indicated 67% compliance , AHI 1.2.  She has issues with her cpap masks (she got wrong size of mask). She presents today stating that she has not been compliant with her CPAP. Down Load reveals 13% compliance in the last month. She states she has had issues with her nasal pillows and she has not been wearing her CPAP. We discussed the risks on non-treated OSA, and she feels if we can improve the fit of her nasal pillows she will use the device more. She notes she does sleep better on her CPAP, ans she states she knows she needs to use it more.She does have some complaints of allergies, but she is not following  her allergy regimen.Additionally she is not following a regular regimen for her bronchiectasis.We reviewed her CT Chest results. Pulmonary Nodule is unchanged.She denies [name], chest pain, orthopnea or hemoptysis.  Test Results: Down Load 09/11/2017-10/10/2017 Set pressure of 11 cm H2) Usage 4/30 days or 13% > 4 hours 1 day < 4 hours 3 days  Median Usage days used 2 hours 35 minutes + for leaks AHI 6.2.  CT Chest w/o Contrast IMPRESSION: 1. Interval stability of 3 mm solid left lower lobe pulmonary nodule, considered benign. 2. Interval stability of mild cylindrical bronchiectasis in the medial right lung.  CBC Latest Ref Rng & Units 10/06/2017 09/17/2017 08/25/2017  WBC 3.8 - 10.8 Thousand/uL 7.2 8.4 8.6  Hemoglobin 11.7 -  15.5 g/dL 13.9 14.7 14.4  Hematocrit 35.0 - 45.0 % 42.4 43.0 42.7  Platelets 140 - 400 Thousand/uL 341 344 358    BMP Latest Ref Rng & Units 10/06/2017 09/17/2017 08/25/2017  Glucose 65 - 99 mg/dL 135(H) 132(H) 90  BUN 7 - 25 mg/dL 19 23 26(H)  Creatinine 0.50 - 0.99 mg/dL 1.01(H) 0.99 0.94  BUN/Creat Ratio 6 - 22 (calc) 19 NOT APPLICABLE 24(O)  Sodium 135 - 146 mmol/L 141 143 144  Potassium 3.5 - 5.3 mmol/L 3.9 5.1 4.2  Chloride 98 - 110 mmol/L 99 102 102  CO2 20 - 32 mmol/L 32 33(H) 31  Calcium 8.6 - 10.4 mg/dL 9.8 10.4 10.1    BNP    Component Value Date/Time   BNP 31.8 06/24/2015 2109    ProBNP No results found for: PROBNP  PFT    Component Value Date/Time   FEV1PRE 2.51 11/21/2015 1051   FEV1POST 2.49 11/21/2015 1051   FVCPRE 2.96 11/21/2015 1051   FVCPOST 2.88 11/21/2015 1051   TLC 4.36 11/21/2015 1051   DLCOUNC 18.12 11/21/2015 1051   PREFEV1FVCRT 85 11/21/2015 1051   PSTFEV1FVCRT 86 11/21/2015 1051    Ct Chest Wo Contrast  Result Date: 10/01/2017 CLINICAL DATA:  Follow-up pulmonary nodule.  Nonproductive cough. EXAM: CT CHEST WITHOUT CONTRAST TECHNIQUE: Multidetector CT imaging of the chest was performed following the standard protocol without IV contrast. COMPARISON:  10/02/2016  high-resolution chest CT. 03/23/2017 chest radiograph. FINDINGS: Cardiovascular: Normal heart size. No significant pericardial fluid/thickening. Mildly atherosclerotic nonaneurysmal thoracic aorta. Normal caliber pulmonary arteries. Mediastinum/Nodes: No discrete thyroid nodules. Unremarkable esophagus. No pathologically enlarged axillary, mediastinal or gross hilar lymph nodes, noting limited sensitivity for the detection of hilar adenopathy on this noncontrast study. Lungs/Pleura: No pneumothorax. No pleural effusion. No acute consolidative airspace disease or lung masses. Stable solid 3 mm left lower lobe pulmonary nodule (series 3/image 100). No new significant pulmonary nodules. Stable mild  cylindrical bronchiectasis in the medial right lower lobe, medial basilar right upper lobe and medial right middle lobe. Stable mild subpleural lines and reticulation in the medial dependent right lower lobe compatible with mild postinfectious/postinflammatory scarring. Upper abdomen: No acute abnormality. Musculoskeletal: No aggressive appearing focal osseous lesions. Partially visualized surgical hardware from ACDF in the lower cervical spine. Moderate thoracic spondylosis. IMPRESSION: 1. Interval stability of 3 mm solid left lower lobe pulmonary nodule, considered benign. 2. Interval stability of mild cylindrical bronchiectasis in the medial right lung. Aortic Atherosclerosis (ICD10-I70.0). Electronically Signed   By: Ilona Sorrel M.D.   On: 10/01/2017 15:24     Past medical hx Past Medical History:  Diagnosis Date  . Arthritis   . ASCVD (arteriosclerotic cardiovascular disease)   . Depression   . Fibromyalgia   . Gait disorder 10/26/2012  . GERD (gastroesophageal reflux disease)   . GI bleed   . Hyperlipidemia   . Hypertension   . Hypothyroid   . Periprosthetic supracondylar fracture of femur, left  03/23/2017  . Restless leg syndrome   . Rhinitis 06/25/2010  . Sleep apnea   . TIA (transient ischemic attack)    3        . Vitamin D deficiency      Social History   Tobacco Use  . Smoking status: Never Smoker  . Smokeless tobacco: Never Used  Substance Use Topics  . Alcohol use: Yes    Alcohol/week: 0.0 oz    Comment: rarely  . Drug use: No    Crystal [name] reports that she has never smoked. She has never used smokeless tobacco. She reports that she drinks alcohol. She reports that she does not use drugs.  Tobacco Cessation: Never smoker   Past surgical hx, Family hx, Social hx all reviewed.  Current Outpatient Medications on File Prior to Visit  Medication Sig  . aspirin EC 81 MG tablet Take 81 mg by mouth daily.  . bisoprolol-hydrochlorothiazide (ZIAC) 5-6.25 MG tablet  Take 1 tablet by mouth daily.  [name] buPROPion (WELLBUTRIN XL) 300 MG 24 hr tablet TAKE 1 TABLET BY MOUTH EVERY MORNING FOR MOOD AND FOCUS ATTENTION  . CIPRODEX OTIC suspension PLACE 4 DROPS INTO THE RIGHT EAR 2 (TWO) TIMES DAILY.  . cyclobenzaprine (FLEXERIL) 5 MG tablet TAKE 1 TABLET BY MOUTH 2 TIMES DAILY AS NEEDED FOR MUSCLE SPASMS. (Patient taking differently: TAKE 1 TABLET BY MOUTH 1 TIME DAILY AS NEEDED FOR MUSCLE SPASMS.)  . diclofenac sodium (VOLTAREN) 1 % GEL Apply 2 g topically 2 (two) times daily as needed.   . DULoxetine (CYMBALTA) 60 MG capsule TAKE 1 CAPSULE BY MOUTH 2 TIMES A DAY FOR PAIN.  . fenofibrate micronized (LOFIBRA) 134 MG capsule TAKE 1 CAPSULE BY MOUTH DAILY BEFORE BREAKFAST  . ferrous sulfate 325 (65 FE) MG tablet Take 325 mg by mouth daily.   . fluticasone (FLONASE) 50 MCG/ACT nasal spray USE TWO SPRAYS IN EACH NOSTRIL DAILY AS NEEDED  . gabapentin (NEURONTIN) 300 MG  capsule TAKE ONE CAPSULE 3 TIMES A DAY  . ketoconazole (NIZORAL) 2 % cream Apply 1 application topically 2 (two) times daily.  [name] levothyroxine (SYNTHROID, LEVOTHROID) 100 MCG tablet Take 100 mcg by mouth daily before breakfast.  . Linaclotide (LINZESS PO) Take by mouth daily.  . meclizine (ANTIVERT) 25 MG tablet TAKE 1 TABLET (25 MG TOTAL) BY MOUTH 3 (THREE) TIMES DAILY AS NEEDED FOR DIZZINESS.  [name] oxybutynin (DITROPAN) 5 MG tablet TAKE 1 TABLET BY MOUTH TWICE A DAY  . phentermine (ADIPEX-P) 37.5 MG tablet Take 1/2 to 1 tablet every morning for dieting & weightloss  . polyethylene glycol (MIRALAX / GLYCOLAX) packet Take 17 g by mouth daily as needed for mild constipation.  . ranitidine (ZANTAC) 300 MG tablet Take 1 tablet (300 mg total) by mouth at bedtime.  [name] rOPINIRole (REQUIP) 2 MG tablet TAKE 1 TABLET 4 TIMES A DAY FOR RESTLESS LEG SYNDROME (Patient taking differently: TAKE 1 TABLET 3TIMES A DAY FOR RESTLESS LEG SYNDROME)  . telmisartan (MICARDIS) 20 MG tablet Take 1 tablet (20 mg total) by mouth daily.  .  Vitamin D, Ergocalciferol, (DRISDOL) 50000 units CAPS capsule TAKE 1 CAPSULE (50,000 UNITS TOTAL) BY MOUTH DAILY.   No current facility-administered medications on file prior to visit.      Allergies  Allergen Reactions  . Atorvastatin Other (See Comments)    Extreme pain  . Codeine Other (See Comments)    Headache  . Pravastatin Other (See Comments)    Exreme pain  . Statins Other (See Comments)    Extreme pain  . Amitriptyline Other (See Comments)    Unspecified  . Fish Oil Nausea Only  . Nuvigil [Armodafinil] Other (See Comments)    Unspecified  . Erythromycin Other (See Comments)    Reaction unspecified  . Erythromycin Base Rash  . Flax Seed [Bio-Flax] Rash    Linseed  . Hydrocodone-Acetaminophen Rash  . Morphine Nausea And Vomiting and Rash  . Nsaids Nausea Only  . Sertraline Rash  . Sinemet [Carbidopa W-Levodopa] Rash  . Sulfamethoxazole Rash    Review Of Systems:  Constitutional:   No  weight loss, night sweats,  Fevers, chills, fatigue, or  lassitude.  HEENT:   Occasional  headaches,  Difficulty swallowing,  Tooth/dental problems, or  Sore throat,                No sneezing, itching, ear ache, +nasal congestion, +post nasal drip, +dry mouth  CV:  No chest pain,  Orthopnea, PND, swelling in lower extremities, anasarca, dizziness, palpitations, syncope.   GI  No heartburn, indigestion, abdominal pain, nausea, vomiting, diarrhea, change in bowel habits, loss of appetite, bloody stools.   Resp: No shortness of breath with exertion or at rest.  Baseline  excess mucus, baseline  productive cough,  No non-productive cough,  No coughing up of blood.  No change in color of mucus.  No wheezing.  No chest wall deformity  Skin: no rash or lesions.  GU: no dysuria, change in color of urine, no urgency or frequency.  No flank pain, no hematuria   MS:  No joint pain or swelling.  No decreased range of motion.  No back pain.  Psych:  No change in mood or affect. No  depression or anxiety.  No memory loss.   Vital Signs BP 122/80   Pulse 93   Ht 5' 3.5" (1.613 m)   Wt 174 lb (78.9 kg)   SpO2 91%   BMI 30.34 kg/m  Physical Exam:  General- No distress,  A&Ox3, pleasant ENT: No sinus tenderness, TM clear, pale nasal mucosa, no oral exudate,+ post nasal drip, no LAN Cardiac: S1, S2, regular rate and rhythm, no murmur Chest: No wheeze/ rales/ dullness; no accessory muscle use, no nasal flaring, no sternal retractions, rhonchi which clear with cough Abd.: Soft Non-tender, ND, BS + Ext: No clubbing cyanosis, edema Neuro:  normal strength, MAE x 4, A&O x 3, droop to L eye lid Skin: No rashes, warm and dry Psych: normal mood and behavior   Assessment/Plan  Obstructive sleep apnea Non-compliant with CPAP therapy States this is because of bad mask fit. Plan We will send you for a mask fitting ( nasal pillows please consider dreamware) Continue on CPAP at bedtime. You appear to be benefiting from the treatment Goal is to wear for at least 6 hours each night for maximal clinical benefit. Continue to work on weight loss, as the link between excess weight  and sleep apnea is well established.  Do not drive if sleepy. Remember to clean mask, tubing, filter, and reservoir once weekly with soapy water.  Please contact office for sooner follow up if symptoms do not improve or worsen or seek emergency care     Bronchiectasis without complication ([place]) Stable at present Plan: We will give you a flutter valve. Use 4 puffs twice daily. Clean Flutter valve once daily  with soapy water. Mucinex ( Generic ok) 1200 mg once daily Biotene Mouthwash for mouth dryness. Follow up with Dr. Elsworth Soho or Judson Roch NP  in  3 months .( Follow up OSA and Bronchiectasis) Please contact office for sooner follow up if symptoms do not improve or worsen or seek emergency care  Consider culturing sputum at follow up for baseline with bronchiectasis.   Rhinitis Pt.  Currently not using her allergy regimen Plan: Resume your allergy treatment. Regimen. ( Flonase  Resume Claritin 10 mg daily for allergies. Mucinex ( Generic ok) 1200 mg once daily Biotene Mouthwash for mouth dryness. Follow up with Dr. Elsworth Soho or Judson Roch NP  in  3 months .( Follow up OSA and Bronchiectasis) Please contact office for sooner follow up if symptoms do not improve or worsen or seek emergency care       Magdalen Spatz, NP 10/12/2017  2:13 PM

## 2017-10-12 NOTE — Assessment & Plan Note (Signed)
Non-compliant with CPAP therapy States this is because of bad mask fit. Plan We will send you for a mask fitting ( nasal pillows please consider dreamware) Continue on CPAP at bedtime. You appear to be benefiting from the treatment Goal is to wear for at least 6 hours each night for maximal clinical benefit. Continue to work on weight loss, as the link between excess weight  and sleep apnea is well established.  Do not drive if sleepy. Remember to clean mask, tubing, filter, and reservoir once weekly with soapy water.  Please contact office for sooner follow up if symptoms do not improve or worsen or seek emergency care

## 2017-10-12 NOTE — Assessment & Plan Note (Signed)
Pt. Currently not using her allergy regimen Plan: Resume your allergy treatment. Regimen. ( Flonase  Resume Claritin 10 mg daily for allergies. Mucinex ( Generic ok) 1200 mg once daily Biotene Mouthwash for mouth dryness. Follow up with Dr. Elsworth Soho or Judson Roch NP  in  3 months .( Follow up OSA and Bronchiectasis) Please contact office for sooner follow up if symptoms do not improve or worsen or seek emergency care

## 2017-10-12 NOTE — Assessment & Plan Note (Signed)
Stable at present Plan: We will give you a flutter valve. Use 4 puffs twice daily. Clean Flutter valve once daily  with soapy water. Mucinex ( Generic ok) 1200 mg once daily Biotene Mouthwash for mouth dryness. Follow up with Dr. Elsworth Soho or Judson Roch NP  in  3 months .( Follow up OSA and Bronchiectasis) Please contact office for sooner follow up if symptoms do not improve or worsen or seek emergency care  Consider culturing sputum at follow up for baseline with bronchiectasis.

## 2017-10-12 NOTE — Patient Instructions (Addendum)
It is good to see you today. We will send you for a mask fitting ( nasal pillows please consider dreamware) Continue on CPAP at bedtime. You appear to be benefiting from the treatment Goal is to wear for at least 6 hours each night for maximal clinical benefit. Continue to work on weight loss, as the link between excess weight  and sleep apnea is well established.  Do not drive if sleepy. Remember to clean mask, tubing, filter, and reservoir once weekly with soapy water.  Resume your allergy treatment. Regimen. ( Flonase  Resume Claritin 10 mg daily for allergies. We will give you a flutter valve. Use 4 puffs twice daily. Clean Flutter valve once daily  with soapy water. Mucinex ( Generic ok) 1200 mg once daily Biotene Mouthwash for mouth dryness. Follow up with Dr. Vassie Loll or Maralyn Sago NP  in  3 months .( Follow up OSA and Bronchiectasis) Please contact office for sooner follow up if symptoms do not improve or worsen or seek emergency care  Consider culturing sputum at follow up for baseline with bronchiectasis.

## 2017-10-13 LAB — HM COLONOSCOPY

## 2017-10-18 DIAGNOSIS — Z86010 Personal history of colonic polyps: Secondary | ICD-10-CM | POA: Insufficient documentation

## 2017-10-18 DIAGNOSIS — Z860101 Personal history of adenomatous and serrated colon polyps: Secondary | ICD-10-CM | POA: Insufficient documentation

## 2017-10-21 ENCOUNTER — Ambulatory Visit: Payer: Medicare Other | Admitting: Rheumatology

## 2017-10-22 ENCOUNTER — Other Ambulatory Visit: Payer: Self-pay | Admitting: Internal Medicine

## 2017-10-22 MED ORDER — FLUCONAZOLE 150 MG PO TABS: ORAL_TABLET | ORAL | 1 refills | Status: DC

## 2017-10-23 ENCOUNTER — Encounter (INDEPENDENT_AMBULATORY_CARE_PROVIDER_SITE_OTHER): Payer: Self-pay

## 2017-10-24 ENCOUNTER — Other Ambulatory Visit: Payer: Self-pay | Admitting: Internal Medicine

## 2017-10-27 ENCOUNTER — Encounter

## 2017-10-27 ENCOUNTER — Encounter: Payer: Medicare Other | Admitting: Neurology

## 2017-11-03 ENCOUNTER — Other Ambulatory Visit: Payer: Self-pay | Admitting: Internal Medicine

## 2017-11-04 ENCOUNTER — Ambulatory Visit (HOSPITAL_BASED_OUTPATIENT_CLINIC_OR_DEPARTMENT_OTHER): Payer: Medicare Other | Attending: Acute Care

## 2017-11-04 DIAGNOSIS — G4733 Obstructive sleep apnea (adult) (pediatric): Secondary | ICD-10-CM

## 2017-11-10 ENCOUNTER — Encounter: Payer: Self-pay | Admitting: Internal Medicine

## 2017-11-24 ENCOUNTER — Telehealth: Payer: Self-pay | Admitting: Acute Care

## 2017-11-24 NOTE — Telephone Encounter (Signed)
Please call patient and see if mask fitting has been done and if she is using her CPAP every night. [id number] not, please schedule mask fitting . Thanks so much.

## 2017-11-25 ENCOUNTER — Ambulatory Visit: Payer: Medicare Other | Admitting: Podiatry

## 2017-11-25 ENCOUNTER — Encounter: Payer: Self-pay | Admitting: Podiatry

## 2017-11-25 ENCOUNTER — Other Ambulatory Visit: Payer: Self-pay | Admitting: Podiatry

## 2017-11-25 ENCOUNTER — Ambulatory Visit (INDEPENDENT_AMBULATORY_CARE_PROVIDER_SITE_OTHER): Payer: Medicare Other

## 2017-11-25 DIAGNOSIS — M79671 Pain in right foot: Secondary | ICD-10-CM

## 2017-11-25 DIAGNOSIS — M779 Enthesopathy, unspecified: Secondary | ICD-10-CM

## 2017-11-25 DIAGNOSIS — M79672 Pain in left foot: Secondary | ICD-10-CM

## 2017-11-25 MED ORDER — TRIAMCINOLONE ACETONIDE 10 MG/ML IJ SUSP
10.0000 mg | Freq: Once | INTRAMUSCULAR | Status: AC
  Administered 2017-11-25: 10 mg

## 2017-11-25 NOTE — Telephone Encounter (Signed)
ATC pt, no answer. Left message for pt to call back.  

## 2017-11-25 NOTE — Progress Notes (Signed)
Subjective:   Patient ID: Crystal [name], female   DOB: 67 y.o.   MRN: [id number]   HPI Patient presents stating that I have had a lot of pain on top of my joints of both feet I am getting ready to go out of town and it is hard for me to walk   ROS      Objective:  Physical Exam  Neurovascular status intact with patient found to have inflammation pain of the second MPJ bilateral     Assessment:  Acute capsulitis second MPJ bilateral     Plan:  Advised on rigid bottom shoes and today I did inject the joints 3 mg Dexasone Kenalog 5 mg Xylocaine advised on padding therapy and will be seen back as needed

## 2017-11-30 ENCOUNTER — Other Ambulatory Visit: Payer: Self-pay | Admitting: Internal Medicine

## 2017-11-30 IMAGING — CT CT CERVICAL SPINE W/O CM
3 of 4 series · 13 of 33 positions shown, 16 images · non-contrast
Comparison: Radiograph dated 06/14/2015

CLINICAL DATA: 64-year-old female with with recent cervical
discectomy presenting with fall and neck pain.

EXAM:
CT CERVICAL SPINE WITHOUT CONTRAST
TECHNIQUE: Multidetector CT imaging of the cervical spine was performed without
intravenous contrast. Multiplanar CT image reconstructions were also
generated.

[Series 3: c-spine st · axial · 0.26mm/px · z∈[+1073,+1193]mm · 5 of 92 slices shown, 7 images]
[im 16/92  soft-tissue]
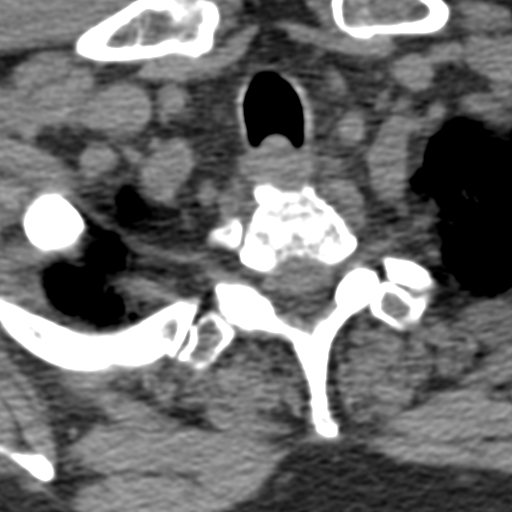
[im 16/92  bone]
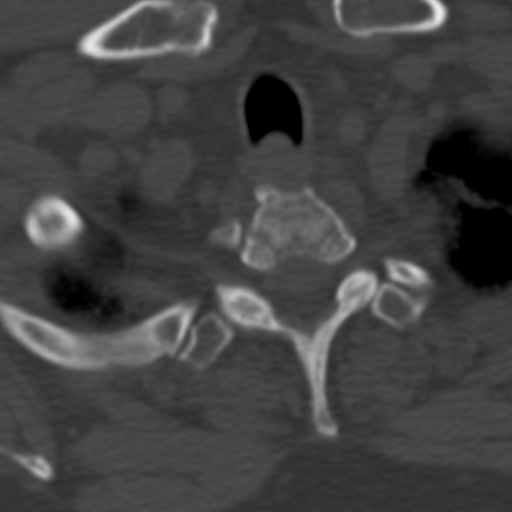
[im 31/92  bone]
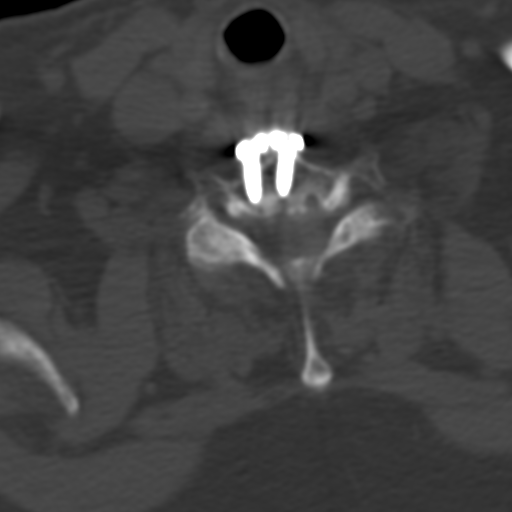
[im 46/92  bone]
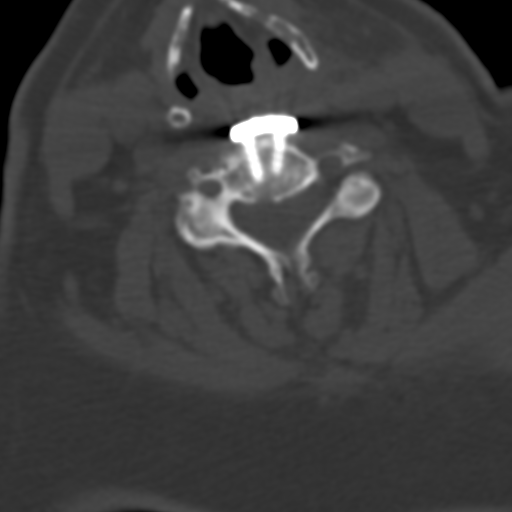
[im 61/92  bone]
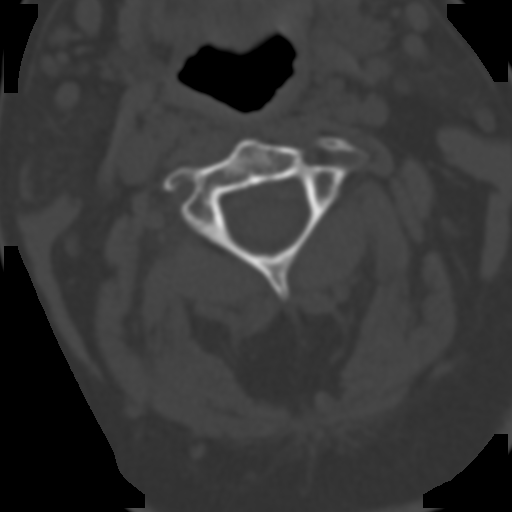
[im 76/92  soft-tissue]
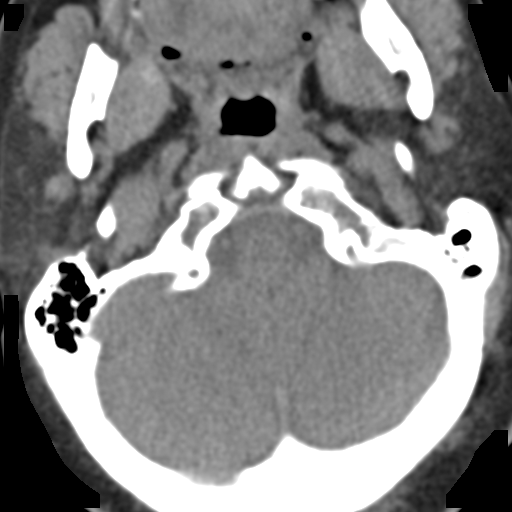
[im 76/92  bone]
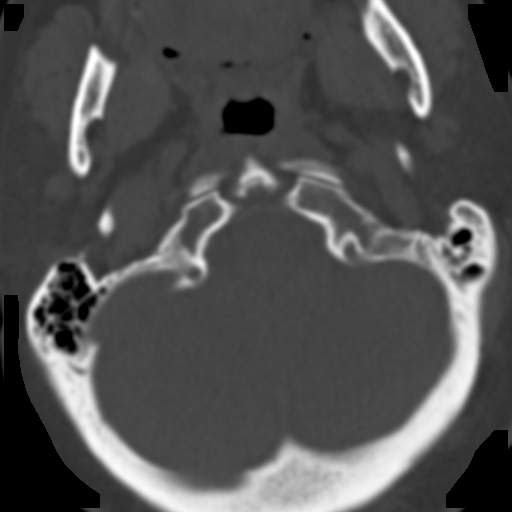

[Series 7: coronal recons · coronal · 0.27mm/px · 3 of 61 slices shown]
[im 13/61  bone]
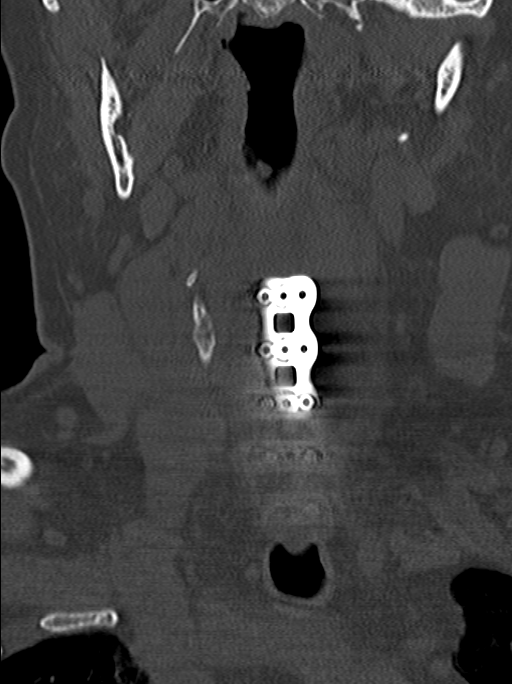
[im 25/61  bone]
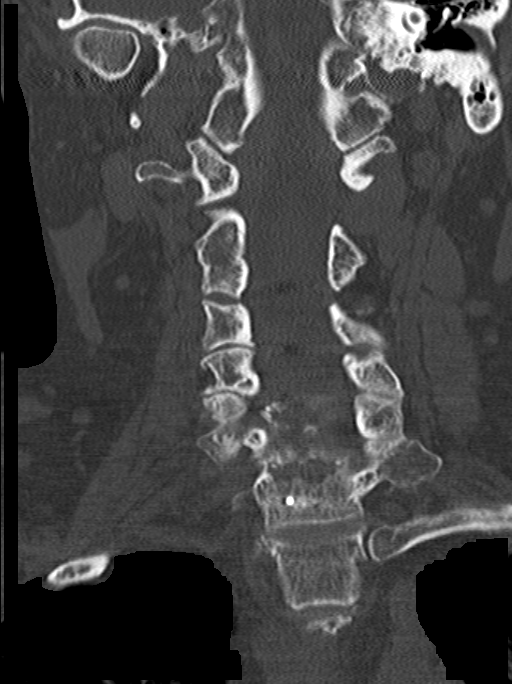
[im 37/61  bone]
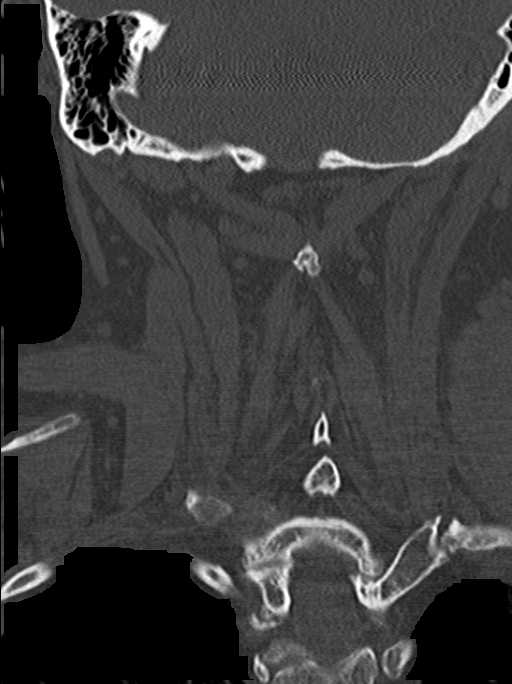

[Series 8: sagittal recons · sagittal · 0.27mm/px · 5 of 61 slices shown, 6 images]
[im 21/61  bone]
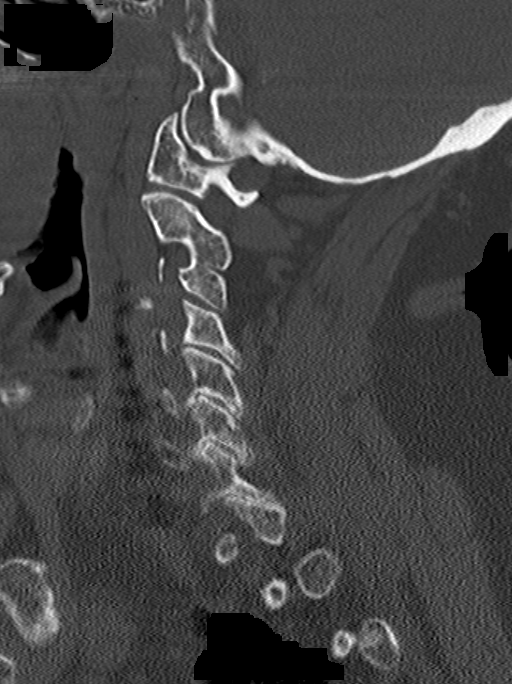
[im 26/61  bone]
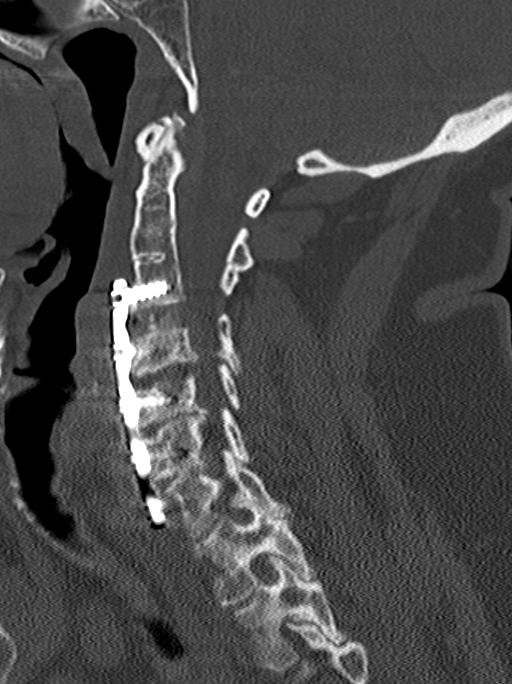
[im 31/61  soft-tissue]
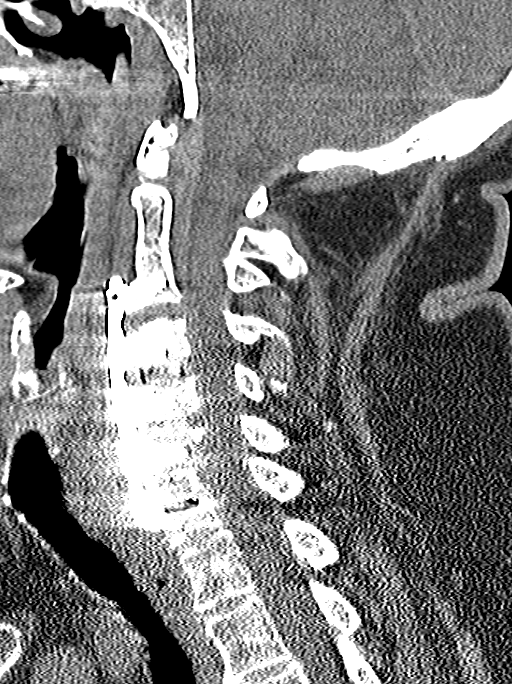
[im 31/61  bone]
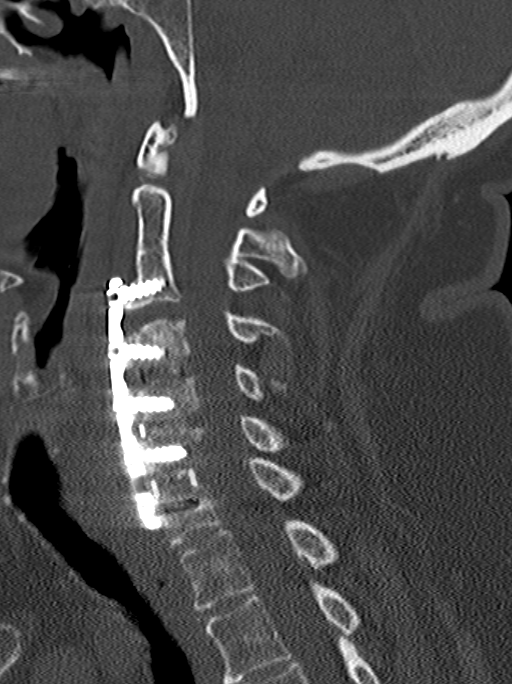
[im 36/61  bone]
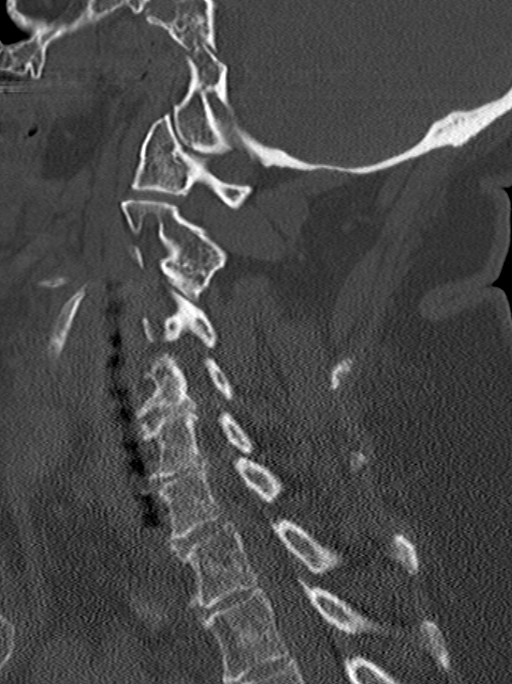
[im 41/61  bone]
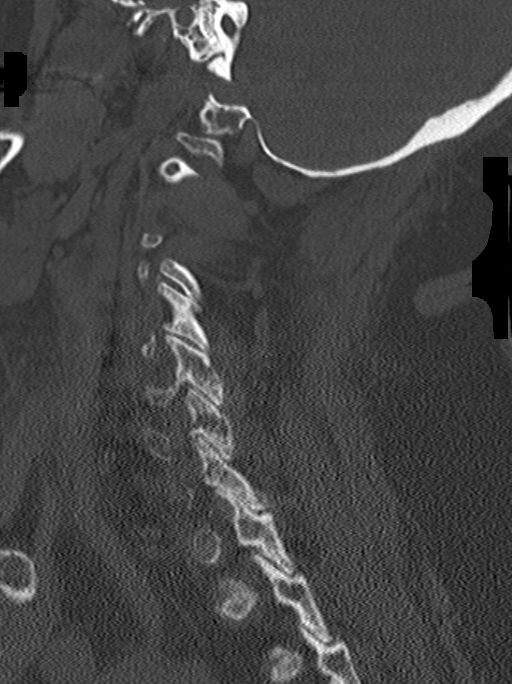

[13 of 33 positions shown; findings below may reference images not displayed]

FINDINGS: There is no acute fracture or subluxation of the cervical
spine.There is postsurgical changes of C3-C7 disc spacers with
anterior fusion plate and screws. The bones are osteopenic. There is
multilevel degenerative changes of the spine. There is C2-C3
ankylosis. NewThe odontoid and spinous processes are intact.There is
normal anatomic alignment of the C1-C2 lateral masses. The
visualized soft tissues appear unremarkable.
IMPRESSION: No acute fracture or subluxation.

C3-C7 anterior fusion plate and screws.

## 2017-12-01 NOTE — Telephone Encounter (Signed)
Please continue to get in touch with patient. Thanks so much

## 2017-12-02 NOTE — Telephone Encounter (Signed)
ATC pt, no answer. Left message for pt to call back.  

## 2017-12-09 NOTE — Telephone Encounter (Signed)
ATC pt, no answer. Left message for pt to call back.  

## 2017-12-10 NOTE — Telephone Encounter (Signed)
Patient called and states she still the rash/sores around her mouth and lips.  Per Dr Oneta Rack, she should make an appointment at her dermatologist.  Patient is aware and will make the appointment.

## 2017-12-10 NOTE — Telephone Encounter (Signed)
Spoke with pt, she states she did have a mask fitting, and received a mask but it fits too tight and she cannot adjust the strap that goes around her head. I asked her was she referring to a chin strap and she states it is the strap that goes around her head.  She didn't use it every night, but plans to wear it for now on. I called Barbara Cower to verify if pt had a mask fit and to find out which mask she currently has. I advised her to call [facility] to see if she can get a larger mask and let us know if she needs an order. Should I place another order for mask fit?

## 2017-12-11 ENCOUNTER — Telehealth: Payer: Self-pay | Admitting: Internal Medicine

## 2017-12-11 NOTE — Telephone Encounter (Signed)
Patient called with c/o recurrent mouth rash, lesions. Derm Specialist unable to see patient until end of July. Per Dr Melford Aase, follow up with ENT, Dr Radene Journey,  treated these sx recently, with instructions to follow up if not improved. Gave patient Dr Pollie Friar number, and [facility] #, per patient request. Requested she update our office of the outcome.

## 2017-12-25 ENCOUNTER — Other Ambulatory Visit: Payer: Self-pay | Admitting: Physician Assistant

## 2017-12-25 DIAGNOSIS — G2581 Restless legs syndrome: Secondary | ICD-10-CM

## 2017-12-26 ENCOUNTER — Other Ambulatory Visit: Payer: Self-pay | Admitting: Internal Medicine

## 2018-01-22 ENCOUNTER — Ambulatory Visit: Payer: Self-pay | Admitting: Adult Health

## 2018-02-04 ENCOUNTER — Other Ambulatory Visit: Payer: Self-pay | Admitting: Physician Assistant

## 2018-02-04 ENCOUNTER — Other Ambulatory Visit: Payer: Self-pay | Admitting: Internal Medicine

## 2018-02-09 ENCOUNTER — Encounter: Payer: Self-pay | Admitting: Physician Assistant

## 2018-02-09 NOTE — Progress Notes (Signed)
MEDICARE ANNUAL WELLNESS VISIT AND FOLLOW UP  Assessment:    Essential hypertension -well controlled currently -monitor at home -dash diet -exercise as tolerated   ASCVD (arteriosclerotic cardiovascular disease) -cont to maximize cholesterol management and BP management   Obstructive sleep apnea -cont use of cpap   Allergic rhinitis, unspecified allergic rhinitis type -cont meds  T2_NIDDM Discussed general issues about diabetes pathophysiology and management., Educational material distributed., Suggested low cholesterol diet., Encouraged aerobic exercise., Discussed foot care., Reminded to get yearly retinal exam. - Hemoglobin A1c   Hypothyroidism, unspecified hypothyroidism type Hypothyroidism-check TSH level, continue medications the same, reminded to take on an empty stomach 30-32mns before food.  - TSH   Hyperlipidemia -continue medications, check lipids, decrease fatty foods, increase activity.  - Lipid panel  Vitamin D deficiency -cont supplement  Medication management - CBC with Differential/Platelet - BASIC METABOLIC PANEL WITH GFR - Hepatic function panel  Gastroesophageal reflux disease, esophagitis presence not specified -cont meds  Crohn's disease with complication, unspecified gastrointestinal tract location ([place] -followed by Dr. MEarlean Shawl  IBS (irritable colon syndrome) -followed by Dr. MEarlean Shawl Fibromyalgia -followed by Dr. HMaryjean Ka Rotator cuff arthropathy, unspecified laterality -followed by ortho   RESTLESS LEG SYNDROME -followed by Dr. HMaryjean Ka  Gait disorder -followed by neuro   Depression, remission partial -cont current meds   Morbid obesity, unspecified obesity type ([place] -diet and exercise as tolerated  Medicare annual wellness visit, subsequent -due next year   Cervical pain -followed by neurosurgery  Spinal stenosis in cervical region -followed by neurosurgery  Fatigue, unspecified type Multifactorial, continue to  monitor  Anemia due to vitamin B12 deficiency, unspecified B12 deficiency type Continue supplement  Insomnia, unspecified type Sleep hygiene discussed   Over 30 minutes of exam, counseling, chart review, and critical decision making was performed  Future Appointments  Date Time Provider [place] 04/28/2018 11:15 AM MUnk Pinto MD [facility] None  11/08/2018 10:00 AM MUnk Pinto MD [facility] None    Plan:   During the course of the visit the patient was educated and counseled about appropriate screening and preventive services including:    Pneumococcal vaccine   Influenza vaccine  Td vaccine  Prevnar 13  Screening electrocardiogram  Screening mammography  Bone densitometry screening  Colorectal cancer screening  Diabetes screening  Glaucoma screening  Nutrition counseling   Advanced directives: given info/requested copies   Subjective:   Crystal SUCHOCKIis a 67y.o. female who presents for Medicare Annual Wellness Visit and 3 month follow up on hypertension, prediabetes, hyperlipidemia, vitamin D def.   Her blood pressure has been controlled at home, today their BP is BP: 124/80 She does workout, she has been having bilateral knee pain, had femur fracture, going to get right hand surgery and needs bilateral shoulder replaced, seeing specialist.  . She denies chest pain, shortness of breath, dizziness.  She is not on cholesterol medication and denies myalgias. Her cholesterol is at goal. The cholesterol last visit was:   Lab Results  Component Value Date   CHOL 166 10/06/2017   HDL 43 (L) 10/06/2017   LDLCALC 95 10/06/2017   TRIG 180 (H) 10/06/2017   CHOLHDL 3.9 10/06/2017   She has been working on diet and exercise for prediabetes, and denies foot ulcerations, hyperglycemia, hypoglycemia , increased appetite, nausea, paresthesia of the feet, polydipsia, polyuria, visual disturbances, vomiting and weight loss. Last A1C in the  office was:  Lab Results  Component Value Date   HGBA1C 5.9 (H)  10/06/2017   Last GFR Lab Results  Component Value Date   GFRNONAA 57 (L) 10/06/2017   Patient is on Vitamin D supplement. Lab Results  Component Value Date   VD25OH 120 (H) 10/06/2017     BMI is Body mass index is 31.77 kg/m., she is working on diet and exercise.  Wt Readings from Last 3 Encounters:  02/10/18 182 lb 3.2 oz (82.6 kg)  10/12/17 174 lb (78.9 kg)  10/06/17 178 lb (80.7 kg)     Medication Review Current Outpatient Medications on File Prior to Visit  Medication Sig  . aspirin EC 81 MG tablet Take 81 mg by mouth daily.  . bisoprolol-hydrochlorothiazide (ZIAC) 5-6.25 MG tablet Take 1 tablet by mouth daily.  . budesonide (ENTOCORT EC) 3 MG 24 hr capsule Take 3 mg by mouth daily.  [name] buPROPion (WELLBUTRIN XL) 300 MG 24 hr tablet TAKE 1 TABLET BY MOUTH EVERY MORNING FOR MOOD AND FOCUS ATTENTION  . CIPRODEX OTIC suspension PLACE 4 DROPS INTO THE RIGHT EAR 2 (TWO) TIMES DAILY.  . cyclobenzaprine (FLEXERIL) 5 MG tablet TAKE 1 TABLET BY MOUTH 2 TIMES DAILY AS NEEDED FOR MUSCLE SPASMS. (Patient taking differently: TAKE 1 TABLET BY MOUTH 1 TIME DAILY AS NEEDED FOR MUSCLE SPASMS.)  . diclofenac sodium (VOLTAREN) 1 % GEL Apply 2 g topically 2 (two) times daily as needed.   . DULoxetine (CYMBALTA) 60 MG capsule TAKE 1 CAPSULE BY MOUTH 2 TIMES A DAY FOR PAIN.  . fenofibrate micronized (LOFIBRA) 134 MG capsule TAKE 1 CAPSULE BY MOUTH DAILY BEFORE BREAKFAST  . ferrous sulfate 325 (65 FE) MG tablet Take 325 mg by mouth daily.   . fluconazole (DIFLUCAN) 150 MG tablet Take 1 tablet weekly if needed for yeast infection  . fluticasone (FLONASE) 50 MCG/ACT nasal spray USE TWO SPRAYS IN EACH NOSTRIL DAILY AS NEEDED  . gabapentin (NEURONTIN) 300 MG capsule TAKE ONE CAPSULE 3 TIMES A DAY  . ketoconazole (NIZORAL) 2 % cream Apply 1 application topically 2 (two) times daily.  [name] levothyroxine (SYNTHROID, LEVOTHROID) 100 MCG tablet  Take 100 mcg by mouth daily before breakfast.  . meclizine (ANTIVERT) 25 MG tablet TAKE 1 TABLET (25 MG TOTAL) BY MOUTH 3 (THREE) TIMES DAILY AS NEEDED FOR DIZZINESS.  [name] oxybutynin (DITROPAN) 5 MG tablet TAKE 1 TABLET BY MOUTH TWICE A DAY  . polyethylene glycol (MIRALAX / GLYCOLAX) packet Take 17 g by mouth daily as needed for mild constipation.  . ranitidine (ZANTAC) 300 MG tablet Take 1 tablet (300 mg total) by mouth at bedtime.  [name] rOPINIRole (REQUIP) 2 MG tablet TAKE 1 TABLET 4 TIMES A DAY FOR RESTLESS LEG SYNDROME  . telmisartan (MICARDIS) 20 MG tablet TAKE 1 TABLET BY MOUTH EVERY DAY  . TRAMADOL HCL ER PO Take by mouth.  . Vitamin D, Ergocalciferol, (DRISDOL) 50000 units CAPS capsule TAKE 1 CAPSULE (50,000 UNITS TOTAL) BY MOUTH DAILY.   No current facility-administered medications on file prior to visit.      Current Problems (verified) Patient Active Problem List   Diagnosis Date Noted  . Bronchiectasis without complication ([place]) 53/74/8270  . Polypharmacy 04/09/2017  . B12 deficiency anemia 06/16/2016  . Sleep talking 03/26/2016  . IBS (irritable colon syndrome) 08/09/2015  . Spinal stenosis in cervical region 06/14/2015  . Morbid obesity (BMI 31.88)  10/31/2014  . Medication management 10/25/2013  . Rotator cuff arthropathy 09/15/2013  . Hyperlipidemia   . T2_NIDDM   . Hypothyroid   . Depression, major, recurrent, in  partial remission ([place])   . Vitamin D deficiency   . ASCVD   . Gait disorder 10/26/2012  . Rhinitis 06/25/2010  . Obstructive sleep apnea 06/21/2010  . RESTLESS LEG SYNDROME 06/21/2010  . Essential hypertension 06/21/2010  . GERD 06/21/2010  . Crohn's Enterocolitis / Regional enteritis 06/21/2010  . Fibromyalgia 06/21/2010    Screening Tests Immunization History  Administered Date(s) Administered  . DT 01/03/2015  . Influenza Whole 03/30/2010  . Influenza, High Dose Seasonal PF 03/13/2015  . Influenza-Unspecified 03/31/2011, 05/14/2014, 02/27/2016   . Pneumococcal Conjugate-13 04/17/2015  . Pneumococcal Polysaccharide-23 01/06/2017  . Pneumococcal-Unspecified 06/30/2005  . Td 06/30/2004  . Zoster 07/01/2007    Preventative care: Last colonoscopy: Dr. Earlean Shawl, 09/2017 Last mammogram: 09/2016 DEXA: 2014 DUE Sleep study 04/2016  Prior vaccinations: TD or Tdap: 2016  Influenza: 2018  Pneumococcal: 2018 Prevnar13: 2016 Shingles/Zostavax: 2009  Names of Other Physician/Practitioners you currently use: 1. [facility]- here for primary care 2. Dr. Nori Riis, eye doctor, last visit 2019 3. Tina Griffiths, dentist, last visit 2018 q 6 months Patient Care Team: Unk Pinto, MD as PCP - General (Internal Medicine) Star Age, MD as Attending Physician (Neurology) Rozetta Nunnery, MD as Consulting Physician (Otolaryngology) Richmond Campbell, MD as Consulting Physician (Gastroenterology) Erline Levine, MD as Consulting Physician (Neurosurgery) Katy Apo, MD as Consulting Physician (Ophthalmology) Crista Luria, MD as Consulting Physician (Dermatology) Carney Bern, MD as Referring Physician (Orthopedic Surgery)  Allergies Allergies  Allergen Reactions  . Atorvastatin Other (See Comments)    Extreme pain  . Codeine Other (See Comments)    Headache  . Pravastatin Other (See Comments)    Exreme pain  . Statins Other (See Comments)    Extreme pain  . Amitriptyline Other (See Comments)    Unspecified  . Fish Oil Nausea Only  . Nuvigil [Armodafinil] Other (See Comments)    Unspecified  . Erythromycin Other (See Comments)    Reaction unspecified  . Erythromycin Base Rash  . Flax Seed [Bio-Flax] Rash    Linseed  . Hydrocodone-Acetaminophen Rash  . Morphine Nausea And Vomiting and Rash  . Nsaids Nausea Only  . Sertraline Rash  . Sinemet [Carbidopa W-Levodopa] Rash  . Sulfamethoxazole Rash    SURGICAL HISTORY She  has a past surgical history that includes Shoulder  adhesion release; Spine surgery; Appendectomy (1960); Cesarean section ([place]); Abdominal hysterectomy (1992); Rotator cuff repair (Left, 2005); Tonsillectomy (1960); Joint replacement; Toe Surgery; Toe Surgery; Anterior cervical decompression/discectomy fusion 4 level (N/A, 06/14/2015); ORIF femur fracture (Left, 03/24/2017); and Knee surgery. FAMILY HISTORY Her family history includes Breast cancer in her mother; Cancer in her mother; Depression in her sister; Diabetes in her mother; Heart disease in her father; Hyperlipidemia in her father; Hypertension in her father; Leukemia in her sister; Lupus in her sister; Migraines in her daughter; Neuropathy in her father; Osteoporosis in her mother; Rheum arthritis in her sister. SOCIAL HISTORY She  reports that she has never smoked. She has never used smokeless tobacco. She reports that she drinks alcohol. She reports that she does not use drugs.  MEDICARE WELLNESS OBJECTIVES: Physical activity: Current Exercise Habits: The patient does not participate in regular exercise at present(doing PT) Cardiac risk factors: Cardiac Risk Factors include: advanced age (>63mn, >>49women);dyslipidemia;hypertension;obesity (BMI >30kg/m2);sedentary lifestyle Depression/mood screen:   [facility]  Decreased Interest 0  Down, Depressed, Hopeless 0  PHQ - 2 Score 0  Altered sleeping -  Tired, decreased energy -  Change in appetite -  Feeling bad or failure about yourself  -  Trouble concentrating -  Moving slowly or fidgety/restless -  Suicidal thoughts -  PHQ-9 Score -  Some recent data might be hidden    ADLs:  In your present state of health, do you have any difficulty performing the following activities: 02/10/2018 10/06/2017  Hearing? N N  Vision? N N  Difficulty concentrating or making decisions? Y N  Walking or climbing stairs? N N  Dressing or bathing? N N  Doing errands, shopping? N N  Some recent data might be hidden      Cognitive Testing  Alert? Yes  Normal Appearance?Yes  Oriented to person? Yes  Place? Yes   Time? Yes  Recall of three objects?  Yes  Can perform simple calculations? Yes  Displays appropriate judgment?Yes  Can read the correct time from a watch face?Yes  EOL planning: Does Patient Have a Medical Advance Directive?: Yes Type of Advance Directive: Living will, Healthcare Power of [place] in Chart?: No - copy requested   Objective:   Today's Vitals   02/10/18 1427  BP: 124/80  Pulse: 90  Resp: 14  Temp: 97.7 F (36.5 C)  SpO2: 97%  Weight: 182 lb 3.2 oz (82.6 kg)  Height: 5' 3.5" (1.613 m)  PainSc: 7   PainLoc: Neck   Body mass index is 31.77 kg/m.  General appearance: alert, no distress, WD/WN,  female HEENT: normocephalic, sclerae anicteric, right ear with pain with pulling on tragus, + erythema, swelling external canal with yellow/white discharge, TMs pearly without bulging or erythema, nares patent, no discharge or erythema, pharynx normal Oral cavity: MMM, no lesions Neck: supple, no lymphadenopathy, no thyromegaly, no masses Heart: RRR, normal S1, S2, no murmurs Lungs: CTA bilaterally, no wheezes, rhonchi, or rales Abdomen: +bs, soft, non tender, non distended, no masses, no hepatomegaly, no splenomegaly Musculoskeletal: nontender, no swelling, no obvious deformity Extremities: no edema, no cyanosis, no clubbing Pulses: 2+ symmetric, upper and lower extremities, normal cap refill Neurological: alert, oriented x 3, CN2-12 intact, strength normal upper extremities and lower extremities, sensation normal throughout, DTRs 2+ throughout, no cerebellar signs, gait normal Psychiatric: normal affect, behavior normal, pleasant  Breast: defer Gyn: defer Rectal: defer   Medicare Attestation I have personally reviewed: The patient's medical and social history Their use of alcohol, tobacco or illicit drugs Their current medications  and supplements The patient's functional ability including ADLs,fall risks, home safety risks, cognitive, and hearing and visual impairment Diet and physical activities Evidence for depression or mood disorders  The patient's weight, height, BMI, and visual acuity have been recorded in the chart.  I have made referrals, counseling, and provided education to the patient based on review of the above and I have provided the patient with a written personalized care plan for preventive services.     Vicie Mutters, PA-C   02/10/2018

## 2018-02-09 NOTE — Patient Instructions (Addendum)
If you have been in the ER, hospital, or long term care facility, we want to see you within one week of discharge so please CONTACT OUR OFFICE at [phone].  We will try to contact you if we know about your admission/visit but we would love for you to contact us first.  We know your history and want to make sure all of your questions are answered and needs are taken care of after admission.   We also want our patients to know that we do NOT approve of [facility] soliciting our patients to do home visits and we do NOT approve of [facility] screenings. Please contact us before doing either of these "services".   [facility]  7 a.m.-6:30 p.m., Monday 7 a.m.-5 p.m., Tuesday-Friday Schedule an appointment by calling [phone].  Will do DEXA the same time

## 2018-02-10 ENCOUNTER — Ambulatory Visit: Payer: Medicare Other | Admitting: Physician Assistant

## 2018-02-10 ENCOUNTER — Encounter: Payer: Self-pay | Admitting: Physician Assistant

## 2018-02-10 VITALS — BP 124/80 | HR 90 | Temp 97.7°F | Resp 14 | Ht 63.5 in | Wt 182.2 lb

## 2018-02-10 DIAGNOSIS — G478 Other sleep disorders: Secondary | ICD-10-CM

## 2018-02-10 DIAGNOSIS — Z0001 Encounter for general adult medical examination with abnormal findings: Secondary | ICD-10-CM

## 2018-02-10 DIAGNOSIS — E2839 Other primary ovarian failure: Secondary | ICD-10-CM

## 2018-02-10 DIAGNOSIS — Z79899 Other long term (current) drug therapy: Secondary | ICD-10-CM

## 2018-02-10 DIAGNOSIS — E782 Mixed hyperlipidemia: Secondary | ICD-10-CM

## 2018-02-10 DIAGNOSIS — J479 Bronchiectasis, uncomplicated: Secondary | ICD-10-CM | POA: Diagnosis not present

## 2018-02-10 DIAGNOSIS — F3341 Major depressive disorder, recurrent, in partial remission: Secondary | ICD-10-CM

## 2018-02-10 DIAGNOSIS — M4802 Spinal stenosis, cervical region: Secondary | ICD-10-CM

## 2018-02-10 DIAGNOSIS — R269 Unspecified abnormalities of gait and mobility: Secondary | ICD-10-CM

## 2018-02-10 DIAGNOSIS — E119 Type 2 diabetes mellitus without complications: Secondary | ICD-10-CM

## 2018-02-10 DIAGNOSIS — I2510 Atherosclerotic heart disease of native coronary artery without angina pectoris: Secondary | ICD-10-CM

## 2018-02-10 DIAGNOSIS — I10 Essential (primary) hypertension: Secondary | ICD-10-CM | POA: Diagnosis not present

## 2018-02-10 DIAGNOSIS — D519 Vitamin B12 deficiency anemia, unspecified: Secondary | ICD-10-CM

## 2018-02-10 DIAGNOSIS — M797 Fibromyalgia: Secondary | ICD-10-CM

## 2018-02-10 DIAGNOSIS — R6889 Other general symptoms and signs: Secondary | ICD-10-CM | POA: Diagnosis not present

## 2018-02-10 DIAGNOSIS — Z6831 Body mass index (BMI) 31.0-31.9, adult: Secondary | ICD-10-CM

## 2018-02-10 DIAGNOSIS — K219 Gastro-esophageal reflux disease without esophagitis: Secondary | ICD-10-CM

## 2018-02-10 DIAGNOSIS — E039 Hypothyroidism, unspecified: Secondary | ICD-10-CM

## 2018-02-10 DIAGNOSIS — G4733 Obstructive sleep apnea (adult) (pediatric): Secondary | ICD-10-CM

## 2018-02-10 DIAGNOSIS — G2581 Restless legs syndrome: Secondary | ICD-10-CM

## 2018-02-10 DIAGNOSIS — E559 Vitamin D deficiency, unspecified: Secondary | ICD-10-CM

## 2018-02-10 DIAGNOSIS — Z0000 Encounter for general adult medical examination without abnormal findings: Secondary | ICD-10-CM

## 2018-02-10 DIAGNOSIS — K50919 Crohn's disease, unspecified, with unspecified complications: Secondary | ICD-10-CM

## 2018-02-10 DIAGNOSIS — K589 Irritable bowel syndrome without diarrhea: Secondary | ICD-10-CM

## 2018-02-11 LAB — LIPID PANEL
Cholesterol: 170 mg/dL (ref <200)
HDL: 63 mg/dL (ref >50)
LDL Cholesterol (Calc): 90 mg/dL (calc)
Non-HDL Cholesterol (Calc): 107 mg/dL (calc) (ref <130)
Total CHOL/HDL Ratio: 2.7 (calc) (ref <5.0)
Triglycerides: 79 mg/dL (ref <150)

## 2018-02-11 LAB — COMPLETE METABOLIC PANEL WITH GFR
AG Ratio: 1.9 (calc) (ref 1.0–2.5)
ALT: 13 U/L (ref 6–29)
AST: 15 U/L (ref 10–35)
Albumin: 4.1 g/dL (ref 3.6–5.1)
Alkaline phosphatase (APISO): 40 U/L (ref 33–130)
BUN: 18 mg/dL (ref 7–25)
CO2: 30 mmol/L (ref 20–32)
Calcium: 9.2 mg/dL (ref 8.6–10.4)
Chloride: 101 mmol/L (ref 98–110)
Creat: 0.76 mg/dL (ref 0.50–0.99)
GFR, Est African American: 93 mL/min/{1.73_m2} (ref >=60)
GFR, Est Non African American: 80 mL/min/{1.73_m2} (ref >=60)
Globulin: 2.2 g/dL (calc) (ref 1.9–3.7)
Glucose, Bld: 90 mg/dL (ref 65–99)
Potassium: 4.2 mmol/L (ref 3.5–5.3)
Sodium: 141 mmol/L (ref 135–146)
Total Bilirubin: 0.6 mg/dL (ref 0.2–1.2)
Total Protein: 6.3 g/dL (ref 6.1–8.1)

## 2018-02-11 LAB — CBC WITH DIFFERENTIAL/PLATELET
Basophils Absolute: 84 cells/uL (ref 0–200)
Basophils Relative: 0.8 %
Eosinophils Absolute: 105 cells/uL (ref 15–500)
Eosinophils Relative: 1 %
HCT: 43.4 % (ref 35.0–45.0)
Hemoglobin: 14.4 g/dL (ref 11.7–15.5)
Lymphs Abs: 1649 cells/uL (ref 850–3900)
MCH: 29.6 pg (ref 27.0–33.0)
MCHC: 33.2 g/dL (ref 32.0–36.0)
MCV: 89.1 fL (ref 80.0–100.0)
MPV: 9.1 fL (ref 7.5–12.5)
Monocytes Relative: 6.7 %
Neutro Abs: 7959 cells/uL — ABNORMAL HIGH (ref 1500–7800)
Neutrophils Relative %: 75.8 %
Platelets: 298 10*3/uL (ref 140–400)
RBC: 4.87 10*6/uL (ref 3.80–5.10)
RDW: 14 % (ref 11.0–15.0)
Total Lymphocyte: 15.7 %
WBC mixed population: 704 cells/uL (ref 200–950)
WBC: 10.5 10*3/uL (ref 3.8–10.8)

## 2018-02-11 LAB — HEMOGLOBIN A1C
Hgb A1c MFr Bld: 6.2 % of total Hgb — ABNORMAL HIGH (ref <5.7)
Mean Plasma Glucose: 131 (calc)
eAG (mmol/L): 7.3 (calc)

## 2018-02-11 LAB — VITAMIN B12: Vitamin B-12: 302 pg/mL (ref 200–1100)

## 2018-02-11 LAB — FOLATE RBC: RBC Folate: 721 ng/mL RBC (ref >280)

## 2018-02-11 LAB — TSH: TSH: 2.22 mIU/L (ref 0.40–4.50)

## 2018-02-11 LAB — IRON, TOTAL/TOTAL IRON BINDING CAP
%SAT: 23 % (calc) (ref 16–45)
Iron: 76 ug/dL (ref 45–160)
TIBC: 324 mcg/dL (calc) (ref 250–450)

## 2018-02-11 LAB — MAGNESIUM: Magnesium: 2.1 mg/dL (ref 1.5–2.5)

## 2018-02-11 LAB — VITAMIN D 25 HYDROXY (VIT D DEFICIENCY, FRACTURES): Vit D, 25-Hydroxy: 107 ng/mL — ABNORMAL HIGH (ref 30–100)

## 2018-02-12 ENCOUNTER — Other Ambulatory Visit: Payer: Self-pay | Admitting: Internal Medicine

## 2018-02-12 DIAGNOSIS — Z1231 Encounter for screening mammogram for malignant neoplasm of breast: Secondary | ICD-10-CM

## 2018-03-03 ENCOUNTER — Telehealth: Payer: Self-pay | Admitting: Acute Care

## 2018-03-03 NOTE — Telephone Encounter (Signed)
Called and spoke to pt. Pt states she received a call from our office a few weeks ago. I do not see documentation of our office attempting to contact pt  Pt is not sure of who called.  Pt last seen 10/12/17 and was instructed to f/u in 30mo  Pt states she has an upcoming surgery planned for October, and will schedule OV after surgery. Nothing further is needed.

## 2018-03-06 ENCOUNTER — Other Ambulatory Visit: Payer: Self-pay | Admitting: Internal Medicine

## 2018-03-11 ENCOUNTER — Ambulatory Visit: Payer: Medicare Other | Admitting: Podiatry

## 2018-03-19 ENCOUNTER — Ambulatory Visit: Payer: Medicare Other | Admitting: Podiatry

## 2018-03-19 DIAGNOSIS — M779 Enthesopathy, unspecified: Secondary | ICD-10-CM

## 2018-03-19 NOTE — Progress Notes (Signed)
Subjective:  Patient ID: Crystal [name], female    DOB: 06-14-1951,  MRN: [id number]  Chief Complaint  Patient presents with  . Foot Pain    bilateral, [place] patient    67 y.o. female presents with the above complaint. Reports pain in the right foot. States she stumped her foot and is having pain in her toe joints again.   Review of Systems: Negative except as noted in the HPI. Denies N/V/F/Ch.  Past Medical History:  Diagnosis Date  . Arthritis   . ASCVD (arteriosclerotic cardiovascular disease)   . Closed nondisplaced subtrochanteric fracture of left femur ([place]) 09/03/2017   September 2018.  Status post surgery by Dr. Marcelino Scot  . Depression   . Fibromyalgia   . Gait disorder 10/26/2012  . GERD (gastroesophageal reflux disease)   . GI bleed   . Hyperlipidemia   . Hypertension   . Hypothyroid   . Periprosthetic supracondylar fracture of femur, left  03/23/2017  . Restless leg syndrome   . Rhinitis 06/25/2010  . Sleep apnea   . TIA (transient ischemic attack)    3        . Vitamin D deficiency     Current Outpatient Medications:  .  aspirin EC 81 MG tablet, Take 81 mg by mouth daily., Disp: , Rfl:  .  bisoprolol-hydrochlorothiazide (ZIAC) 10-6.25 MG tablet, Take 1 tablet daily for BP, Disp: 90 tablet, Rfl: 1 .  budesonide (ENTOCORT EC) 3 MG 24 hr capsule, Take 3 mg by mouth daily., Disp: , Rfl:  .  buPROPion (WELLBUTRIN XL) 300 MG 24 hr tablet, TAKE 1 TABLET BY MOUTH EVERY MORNING FOR MOOD AND FOCUS ATTENTION, Disp: 90 tablet, Rfl: 1 .  CIPRODEX OTIC suspension, PLACE 4 DROPS INTO THE RIGHT EAR 2 (TWO) TIMES DAILY., Disp: 7.5 mL, Rfl: 0 .  cyclobenzaprine (FLEXERIL) 5 MG tablet, TAKE 1 TABLET BY MOUTH 2 TIMES DAILY AS NEEDED FOR MUSCLE SPASMS. (Patient taking differently: TAKE 1 TABLET BY MOUTH 1 TIME DAILY AS NEEDED FOR MUSCLE SPASMS.), Disp: 90 tablet, Rfl: 1 .  diclofenac sodium (VOLTAREN) 1 % GEL, Apply 2 g topically 2 (two) times daily as needed. , Disp: , Rfl: 2 .   DULoxetine (CYMBALTA) 60 MG capsule, TAKE 1 CAPSULE BY MOUTH 2 TIMES A DAY FOR PAIN., Disp: 180 capsule, Rfl: 1 .  fenofibrate micronized (LOFIBRA) 134 MG capsule, TAKE 1 CAPSULE BY MOUTH DAILY BEFORE BREAKFAST, Disp: 90 capsule, Rfl: 3 .  ferrous sulfate 325 (65 FE) MG tablet, Take 325 mg by mouth daily. , Disp: , Rfl:  .  fluconazole (DIFLUCAN) 150 MG tablet, Take 1 tablet weekly if needed for yeast infection, Disp: 4 tablet, Rfl: 1 .  fluticasone (FLONASE) 50 MCG/ACT nasal spray, USE TWO SPRAYS IN EACH NOSTRIL DAILY AS NEEDED, Disp: 48 g, Rfl: 3 .  gabapentin (NEURONTIN) 300 MG capsule, TAKE ONE CAPSULE 3 TIMES A DAY, Disp: 270 capsule, Rfl: 0 .  ketoconazole (NIZORAL) 2 % cream, Apply 1 application topically 2 (two) times daily., Disp: 15 g, Rfl: 0 .  levothyroxine (SYNTHROID, LEVOTHROID) 100 MCG tablet, Take 100 mcg by mouth daily before breakfast., Disp: , Rfl:  .  meclizine (ANTIVERT) 25 MG tablet, TAKE 1 TABLET (25 MG TOTAL) BY MOUTH 3 (THREE) TIMES DAILY AS NEEDED FOR DIZZINESS., Disp: 90 tablet, Rfl: 0 .  oxybutynin (DITROPAN) 5 MG tablet, TAKE 1 TABLET BY MOUTH TWICE A DAY, Disp: 180 tablet, Rfl: 1 .  polyethylene glycol (MIRALAX / GLYCOLAX) packet, Take  17 g by mouth daily as needed for mild constipation., Disp: 14 each, Rfl: 0 .  ranitidine (ZANTAC) 300 MG tablet, Take 1 tablet (300 mg total) by mouth at bedtime., Disp: 90 tablet, Rfl: 0 .  rOPINIRole (REQUIP) 2 MG tablet, TAKE 1 TABLET 4 TIMES A DAY FOR RESTLESS LEG SYNDROME, Disp: 360 tablet, Rfl: 1 .  telmisartan (MICARDIS) 20 MG tablet, TAKE 1 TABLET BY MOUTH EVERY DAY, Disp: 90 tablet, Rfl: 1 .  TRAMADOL HCL ER PO, Take by mouth., Disp: , Rfl:  .  Vitamin D, Ergocalciferol, (DRISDOL) 50000 units CAPS capsule, TAKE 1 CAPSULE (50,000 UNITS TOTAL) BY MOUTH DAILY., Disp: 90 capsule, Rfl: 1  Social History   Tobacco Use  Smoking Status Never Smoker  Smokeless Tobacco Never Used    Allergies  Allergen Reactions  . Atorvastatin  Other (See Comments)    Extreme pain  . Codeine Other (See Comments)    Headache  . Pravastatin Other (See Comments)    Exreme pain  . Statins Other (See Comments)    Extreme pain  . Amitriptyline Other (See Comments)    Unspecified  . Fish Oil Nausea Only  . Nuvigil [Armodafinil] Other (See Comments)    Unspecified  . Erythromycin Other (See Comments)    Reaction unspecified  . Erythromycin Base Rash  . Flax Seed [Bio-Flax] Rash    Linseed  . Hydrocodone-Acetaminophen Rash  . Morphine Nausea And Vomiting and Rash  . Nsaids Nausea Only  . Sertraline Rash  . Sinemet [Carbidopa W-Levodopa] Rash  . Sulfamethoxazole Rash   Objective:  There were no vitals filed for this visit. There is no height or weight on file to calculate BMI. Constitutional Well developed. Well nourished.  Vascular Dorsalis pedis pulses palpable bilaterally. Posterior tibial pulses palpable bilaterally. Capillary refill normal to all digits.  No cyanosis or clubbing noted. Pedal hair growth normal.  Neurologic Normal speech. Oriented to person, place, and time. Epicritic sensation to light touch grossly present bilaterally.  Dermatologic Nails well groomed and normal in appearance. No open wounds. No skin lesions.  Orthopedic: Normal joint ROM without pain or crepitus bilaterally. No visible deformities. POP 2nd/3rd MPJs Right foot.   Radiographs: None today Assessment:   1. Capsulitis    Plan:  Patient was evaluated and treated and all questions answered.  Capsulitis R 2nd/3rd MPJ -Injections delivered to 2nd/3rd MPJs right. -Educated on reduction of activity.  Procedure: Joint Injection Location: Right 2nd/3rd MPJ joint Skin Prep: Alcohol. Injectate: 0.5 cc 1% lidocaine plain, 0.5 cc dexamethasone phosphate. Disposition: Patient tolerated procedure well. Injection site dressed with a band-aid.   Return if symptoms worsen or fail to improve.

## 2018-03-25 ENCOUNTER — Other Ambulatory Visit: Payer: Self-pay | Admitting: Internal Medicine

## 2018-03-25 DIAGNOSIS — N3281 Overactive bladder: Secondary | ICD-10-CM

## 2018-03-26 ENCOUNTER — Telehealth: Payer: Self-pay | Admitting: Physician Assistant

## 2018-03-26 MED ORDER — CLOTRIMAZOLE-BETAMETHASONE 1-0.05 % EX CREA: 1.0000 "application " | TOPICAL_CREAM | Freq: Two times a day (BID) | CUTANEOUS | 2 refills | Status: DC

## 2018-03-26 MED ORDER — DOXYCYCLINE HYCLATE 100 MG PO CAPS: ORAL_CAPSULE | ORAL | 0 refills | Status: DC

## 2018-03-26 NOTE — Telephone Encounter (Signed)
PT HAS BEEN MADE AWARE AND VOICED UNDERSTANDING PT WAS INFORMED TO GET ON A B12 SUBLINGUAL. PT REPORTS SHE ALREADY IS GOING TO DERM.

## 2018-03-26 NOTE — Telephone Encounter (Signed)
Is she on B12 sublingual? Needs to be to help her mouth! Send in cream that is cheap with [facility] if it is expensive with her insurance, use twice a day. If not better will refer to derm when she gets back.  Sent in Doxy.  Congrats!

## 2018-03-26 NOTE — Telephone Encounter (Signed)
-----   Message from Elenor Quinones, [place] sent at 03/24/2018 11:40 AM EDT ----- Regarding: MED Pt reports she is still having some trouble with her mouth & would like an Rx for it. Pt would like the ABX to take with her out of town her daughter is having a baby.  Per yellow note

## 2018-04-06 ENCOUNTER — Ambulatory Visit
Admission: RE | Admit: 2018-04-06 | Discharge: 2018-04-06 | Disposition: A | Discharge status: Home / Self Care | Payer: Medicare Other | Source: Ambulatory Visit | Attending: Physician Assistant | Admitting: Physician Assistant

## 2018-04-06 ENCOUNTER — Ambulatory Visit
Admission: RE | Admit: 2018-04-06 | Discharge: 2018-04-06 | Disposition: A | Discharge status: Home / Self Care | Payer: Medicare Other | Source: Ambulatory Visit | Attending: Internal Medicine | Admitting: Internal Medicine

## 2018-04-06 DIAGNOSIS — Z1231 Encounter for screening mammogram for malignant neoplasm of breast: Secondary | ICD-10-CM

## 2018-04-06 DIAGNOSIS — E2839 Other primary ovarian failure: Secondary | ICD-10-CM

## 2018-04-20 ENCOUNTER — Encounter: Payer: Self-pay | Admitting: Physician Assistant

## 2018-04-20 ENCOUNTER — Ambulatory Visit: Payer: Medicare Other | Admitting: Physician Assistant

## 2018-04-20 VITALS — BP 122/74 | HR 62 | Temp 98.2°F | Resp 18 | Ht 63.5 in | Wt 182.8 lb

## 2018-04-20 DIAGNOSIS — M2550 Pain in unspecified joint: Secondary | ICD-10-CM

## 2018-04-20 DIAGNOSIS — E119 Type 2 diabetes mellitus without complications: Secondary | ICD-10-CM

## 2018-04-20 DIAGNOSIS — R682 Dry mouth, unspecified: Secondary | ICD-10-CM | POA: Diagnosis not present

## 2018-04-20 DIAGNOSIS — B370 Candidal stomatitis: Secondary | ICD-10-CM

## 2018-04-20 DIAGNOSIS — R12 Heartburn: Secondary | ICD-10-CM

## 2018-04-20 MED ORDER — NYSTATIN 100000 UNIT/ML MT SUSP: OROMUCOSAL | 0 refills | Status: DC

## 2018-04-20 MED ORDER — FLUCONAZOLE 150 MG PO TABS: 150.0000 mg | ORAL_TABLET | Freq: Every day | ORAL | 3 refills | Status: DC

## 2018-04-20 NOTE — Progress Notes (Signed)
Subjective:    Patient ID: Crystal [name], female    DOB: February 24, 1951, 67 y.o.   MRN: [id number]  HPI 67 y.o. WF presents with multitude of symptoms. She states since June she has had several issues. She has had pain around her mouth for angular chelitis, rawness with white patches, tongue swelling, she has seen Dr. Lucia Gaskins in the past who sent her to dermatology- has been putting triamcinolone and clotrimazole which helps some and puts local honey on it.   Her B12 was 302, iron is 76 02/10/2018.  She has had a cough been out of breath, HA, lack of energy, diarrhea and heartburn. She has not had an EGD, she has had colonoscopy 09/2017.  She had negative C diff, had elevated CRP. She is following with Dr. Earlean Shawl from this and her crohn's will get colonosocpy every 3 months. She has dry mouth dry eyes. She has early saiety, heartburn and is on protonix.    Blood pressure 122/74, pulse 62, temperature 98.2 F (36.8 C), resp. rate 18, height 5' 3.5" (1.613 m), weight 182 lb 12.8 oz (82.9 kg), SpO2 99 %.  Medications Current Outpatient Medications on File Prior to Visit  Medication Sig  . aspirin EC 81 MG tablet Take 81 mg by mouth daily.  . bisoprolol-hydrochlorothiazide (ZIAC) 10-6.25 MG tablet Take 1 tablet daily for BP  . budesonide (ENTOCORT EC) 3 MG 24 hr capsule Take 3 mg by mouth daily.  [name] buPROPion (WELLBUTRIN XL) 300 MG 24 hr tablet TAKE 1 TABLET BY MOUTH EVERY MORNING FOR MOOD AND FOCUS ATTENTION  . CIPRODEX OTIC suspension PLACE 4 DROPS INTO THE RIGHT EAR 2 (TWO) TIMES DAILY.  . clotrimazole-betamethasone (LOTRISONE) cream Apply 1 application topically 2 (two) times daily.  . cyclobenzaprine (FLEXERIL) 5 MG tablet TAKE 1 TABLET BY MOUTH 2 TIMES DAILY AS NEEDED FOR MUSCLE SPASMS. (Patient taking differently: TAKE 1 TABLET BY MOUTH 1 TIME DAILY AS NEEDED FOR MUSCLE SPASMS.)  . diclofenac sodium (VOLTAREN) 1 % GEL Apply 2 g topically 2 (two) times daily as needed.   . DULoxetine  (CYMBALTA) 60 MG capsule TAKE 1 CAPSULE BY MOUTH 2 TIMES A DAY FOR PAIN.  . fenofibrate micronized (LOFIBRA) 134 MG capsule TAKE 1 CAPSULE BY MOUTH DAILY BEFORE BREAKFAST  . ferrous sulfate 325 (65 FE) MG tablet Take 325 mg by mouth daily.   . fluconazole (DIFLUCAN) 150 MG tablet Take 1 tablet weekly if needed for yeast infection  . gabapentin (NEURONTIN) 300 MG capsule TAKE ONE CAPSULE 3 TIMES A DAY  . ketoconazole (NIZORAL) 2 % cream Apply 1 application topically 2 (two) times daily.  [name] levothyroxine (SYNTHROID, LEVOTHROID) 100 MCG tablet Take 100 mcg by mouth daily before breakfast.  . meclizine (ANTIVERT) 25 MG tablet TAKE 1 TABLET (25 MG TOTAL) BY MOUTH 3 (THREE) TIMES DAILY AS NEEDED FOR DIZZINESS.  [name] oxybutynin (DITROPAN) 5 MG tablet TAKE 1 TABLET BY MOUTH TWICE A DAY  . polyethylene glycol (MIRALAX / GLYCOLAX) packet Take 17 g by mouth daily as needed for mild constipation.  . ranitidine (ZANTAC) 300 MG tablet Take 1 tablet (300 mg total) by mouth at bedtime.  [name] rOPINIRole (REQUIP) 2 MG tablet TAKE 1 TABLET 4 TIMES A DAY FOR RESTLESS LEG SYNDROME  . telmisartan (MICARDIS) 20 MG tablet TAKE 1 TABLET BY MOUTH EVERY DAY  . TRAMADOL HCL ER PO Take by mouth.  . Vitamin D, Ergocalciferol, (DRISDOL) 50000 units CAPS capsule TAKE 1 CAPSULE (50,000 UNITS TOTAL) BY  MOUTH DAILY.  . fluticasone (FLONASE) 50 MCG/ACT nasal spray USE TWO SPRAYS IN EACH NOSTRIL DAILY AS NEEDED (Patient not taking: Reported on 04/20/2018)   No current facility-administered medications on file prior to visit.     Problem list She has Obstructive sleep apnea; RESTLESS LEG SYNDROME; Essential hypertension; Rhinitis; GERD; Crohn's Enterocolitis / Regional enteritis; Fibromyalgia; Gait disorder; Hyperlipidemia; T2_NIDDM; Hypothyroid; Depression, major, recurrent, in partial remission ([place]); Vitamin D deficiency; ASCVD; Medication management; Rotator cuff arthropathy; Morbid obesity (BMI 31.88) ; Spinal stenosis in cervical  region; IBS (irritable colon syndrome); Sleep talking; B12 deficiency anemia; Polypharmacy; and Bronchiectasis without complication ([place]) on their problem list.   Review of Systems  Constitutional: Positive for fatigue. Negative for chills and fever.  HENT: Positive for mouth sores. Negative for congestion, ear pain, facial swelling, postnasal drip, rhinorrhea, sinus pressure, sinus pain, sore throat, trouble swallowing and voice change.   Respiratory: Positive for shortness of breath. Negative for apnea, cough, choking, chest tightness, wheezing and stridor.   Cardiovascular: Negative for chest pain, palpitations and leg swelling.  Gastrointestinal: Positive for diarrhea and nausea. Negative for abdominal distention, abdominal pain, anal bleeding, blood in stool, constipation, rectal pain and vomiting.  Genitourinary: Negative.   Musculoskeletal: Positive for arthralgias.  Neurological: Negative.   Hematological: Bruises/bleeds easily.       Objective:   Physical Exam  Constitutional: She appears well-developed and well-nourished. No distress.  HENT:  Head: Normocephalic.  Mouth/Throat: No oropharyngeal exudate.  Minimal amounts of white exudate on cheeks and below the gum line of the teeth with  Minimal to no appreciable mucositis.  palpation does elicit mild pain per patient's report.   Eyes: Conjunctivae are normal. No scleral icterus.  Neck: Normal range of motion. Neck supple. No JVD present. No thyromegaly present.  Cardiovascular: Normal rate, regular rhythm, normal heart sounds and intact distal pulses. Exam reveals no gallop and no friction rub.  No murmur heard. Pulmonary/Chest: Effort normal and breath sounds normal. No respiratory distress. She has no wheezes. She has no rales. She exhibits no tenderness.  Abdominal: Soft. Bowel sounds are normal. There is tenderness (diffuse). There is no rebound.  Musculoskeletal: She exhibits tenderness (diffuse).  Lymphadenopathy:     She has no cervical adenopathy.  Neurological:   Lt hemi-facial paresis unchanged  Skin: No rash noted. She is not diaphoretic. No erythema.  Nursing note and vitals reviewed.      Assessment & Plan:    Dry mouth ? Contributing to the ulcers Check labs Get on nystatin wash -     Sjogrens syndrome-A extractable nuclear antibody -     Sjogrens syndrome-B extractable nuclear antibody -     Cyclic citrul peptide antibody, IgG -     Cancel: Rheumatoid factor -     Centromere Antibodies; Future -     RNP Antibody -     nystatin (MYCOSTATIN) 100000 UNIT/ML suspension; 5 ml four times a day, retain in mouth as long as possible (Swish and Spit).  Use for 48 hours after symptoms resolve. -     fluconazole (DIFLUCAN) 150 MG tablet; Take 1 tablet (150 mg total) by mouth daily.  Heartburn -     Celiac Disease Comprehensive Panel with Reflexes - ? Benefit from reglan/need for gastric emptying study with early satiety.  Follow up with Dr. Earlean Shawl  T2_NIDDM Discussed general issues about diabetes pathophysiology and management., Educational material distributed., Suggested low cholesterol diet., Encouraged aerobic exercise., Discussed foot care., Reminded to get yearly retinal  exam.  Arthralgia, unspecified joint -     ANA -     Anti-DNA antibody, double-stranded -     Cyclic citrul peptide antibody, IgG -     Cancel: Rheumatoid factor -     Centromere Antibodies; Future -     RNP Antibody  Thrush -     nystatin (MYCOSTATIN) 100000 UNIT/ML suspension; 5 ml four times a day, retain in mouth as long as possible (Swish and Spit).  Use for 48 hours after symptoms resolve. -     fluconazole (DIFLUCAN) 150 MG tablet; Take 1 tablet (150 mg total) by mouth daily.

## 2018-04-20 NOTE — Patient Instructions (Addendum)
Increase the protonix/pantroprazole to twice a day for 1-2 weeks Can then do one in the morning and pepcid at night Do the nystatin mouth wash Will get labs  If your gas/stomach is not better follow up with Dr. Earlean Shawl May need gastric emptying study to rule our gastroparesis.    Gastroparesis Gastroparesis, also called delayed gastric emptying, is a condition in which food takes longer than normal to empty from the stomach. The condition is usually long-lasting (chronic). What are the causes? This condition may be caused by:  An endocrine disorder, such as hypothyroidism or diabetes. Diabetes is the most common cause of this condition.  A nervous system disease, such as Parkinson disease or multiple sclerosis.  Cancer, infection, or surgery of the stomach or vagus nerve.  A connective tissue disorder, such as scleroderma.  Certain medicines.  In most cases, the cause is not known. What increases the risk? This condition is more likely to develop in:  People with certain disorders, including endocrine disorders, eating disorders, amyloidosis, and scleroderma.  People with certain diseases, including Parkinson disease or multiple sclerosis.  People with cancer or infection of the stomach or vagus nerve.  People who have had surgery on the stomach or vagus nerve.  People who take certain medicines.  Women.  What are the signs or symptoms? Symptoms of this condition include:  An early feeling of fullness when eating.  Nausea.  Weight loss.  Vomiting.  Heartburn.  Abdominal bloating.  Inconsistent blood glucose levels.  Lack of appetite.  Acid from the stomach coming up into the esophagus (gastroesophageal reflux).  Spasms of the stomach.  Symptoms may come and go. How is this diagnosed? This condition is diagnosed with tests, such as:  Tests that check how long it takes food to move through the stomach and intestines. These tests include: ? Upper  gastrointestinal (GI) series. In this test, X-rays of the intestines are taken after you drink a liquid. The liquid makes the intestines show up better on the X-rays. ? Gastric emptying scintigraphy. In this test, scans are taken after you eat food that contains a small amount of radioactive material. ? Wireless capsule GI monitoring system. This test involves swallowing a capsule that records information about movement through the stomach.  Gastric manometry. This test measures electrical and muscular activity in the stomach. It is done with a thin tube that is passed down the throat and into the stomach.  Endoscopy. This test checks for abnormalities in the lining of the stomach. It is done with a long, thin tube that is passed down the throat and into the stomach.  An ultrasound. This test can help rule out gallbladder disease or pancreatitis as a cause of your symptoms. It uses sound waves to take pictures of the inside of your body.  How is this treated? There is no cure for gastroparesis. This condition may be managed with:  Treatment of the underlying condition causing the gastroparesis.  Lifestyle changes, including exercise and dietary changes. Dietary changes can include: ? Changes in what and when you eat. ? Eating smaller meals more often. ? Eating low-fat foods. ? Eating low-fiber forms of high-fiber foods, such as cooked vegetables instead of raw vegetables. ? Having liquid foods in place of solid foods. Liquid foods are easier to digest.  Medicines. These may be given to control nausea and vomiting and to stimulate stomach muscles.  Getting food through a feeding tube. This may be done in severe cases.  A gastric  neurostimulator. This is a device that is inserted into the body with surgery. It helps improve stomach emptying and control nausea and vomiting.  Follow these instructions at home:  Follow your health care provider's instructions about exercise and diet.  Take  medicines only as directed by your health care provider. Contact a health care provider if:  Your symptoms do not improve with treatment.  You have new symptoms. Get help right away if:  You have severe abdominal pain that does not improve with treatment.  You have nausea that does not go away.  You cannot keep fluids down. This information is not intended to replace advice given to you by your health care provider. Make sure you discuss any questions you have with your health care provider. Document Released: 06/16/2005 Document Revised: 11/22/2015 Document Reviewed: 06/12/2014 Elsevier Interactive Patient Education  2018 [place], Adult Oral thrush, also called oral candidiasis, is a fungal infection that develops in the mouth and throat and on the tongue. It causes white patches to form on the mouth and tongue. Ritta Slot is most common in older adults, but it can occur at any age. Many cases of thrush are mild, but this infection can also be serious. Ritta Slot can be a repeated (recurrent) problem for certain people who have a weak body defense system (immune system). The weakness can be caused by chronic illnesses, or by taking medicines that limit the body's ability to fight infection. If a person has difficulty fighting infection, the fungus that causes thrush can spread through the body. This can cause life-threatening blood or organ infections. What are the causes? This condition is caused by a fungus (yeast) called Candida albicans.  This fungus is normally present in small amounts in the mouth and on other mucous membranes. It usually causes no harm.  If conditions are present that allow the fungus to grow without control, it invades surrounding tissues and becomes an infection.  Other Candida species can also lead to thrush (rare).  What increases the risk? This condition is more likely to develop in:  People with a weakened immune system.  Older  adults.  People with HIV (human immunodeficiency virus).  People with diabetes.  People with dry mouth (xerostomia).  Pregnant women.  People with poor dental care, especially people who have false teeth.  People who use antibiotic medicines.  What are the signs or symptoms? Symptoms of this condition can vary from mild and moderate to severe and persistent. Symptoms may include:  A burning feeling in the mouth and throat. This can occur at the start of a thrush infection.  White patches that stick to the mouth and tongue. The tissue around the patches may be red, raw, and painful. If rubbed (during tooth brushing, for example), the patches and the tissue of the mouth may bleed easily.  A bad taste in the mouth or difficulty tasting foods.  A cottony feeling in the mouth.  Pain during eating and swallowing.  Poor appetite.  Cracking at the corners of the mouth.  How is this diagnosed? This condition is diagnosed based on:  Physical exam. Your health care provider will look in your mouth.  Health history. Your health care provider will ask you questions about your health.  How is this treated? This condition is treated with medicines called antifungals, which prevent the growth of fungi. These medicines are either applied directly to the affected area (topical) or swallowed (oral). The treatment will depend on the  severity of the condition. Mild thrush Mild cases of thrush may clear up with the use of an antifungal mouth rinse or lozenges. Treatment usually lasts about 14 days. Moderate to severe thrush  More severe thrush infections that have spread to the esophagus are treated with an oral antifungal medicine. A topical antifungal medicine may also be used.  For some severe infections, treatment may need to continue for more than 14 days.  Oral antifungal medicines are rarely used during pregnancy because they may be harmful to the unborn child. If you are pregnant,  talk with your health care provider about options for treatment. Persistent or recurrent thrush For cases of thrush that do not go away or keep coming back:  Treatment may be needed twice as long as the symptoms last.  Treatment will include both oral and topical antifungal medicines.  People with a weakened immune system can take an antifungal medicine on a continuous basis to prevent thrush infections.  It is important to treat conditions that make a person more likely to get thrush, such as diabetes or HIV. Follow these instructions at home: Medicines  Take over-the-counter and prescription medicines only as told by your health care provider.  Talk with your health care provider about an over-the-counter medicine called gentian violet, which kills bacteria and fungi. Relieving soreness and discomfort To help reduce the discomfort of thrush:  Drink cold liquids such as water or iced tea.  Try flavored ice treats or frozen juices.  Eat foods that are easy to swallow, such as gelatin, ice cream, or custard.  Try drinking from a straw if the patches in your mouth are painful.  General instructions  Eat plain, unflavored yogurt as directed by your health care provider. Check the label to make sure the yogurt contains live cultures. This yogurt can help healthy bacteria to grow in the mouth and can stop the growth of the fungus that causes thrush.  If you wear dentures, remove the dentures before going to bed, brush them vigorously, and soak them in a cleaning solution as directed by your health care provider.  Rinse your mouth with a warm salt-water mixture several times a day. To make a salt-water mixture, completely dissolve 1/2-1 tsp of salt in 1 cup of warm water. Contact a health care provider if:  Your symptoms are getting worse or are not improving within 7 days of starting treatment.  You have symptoms of a spreading infection, such as white patches on the skin outside of  the mouth. This information is not intended to replace advice given to you by your health care provider. Make sure you discuss any questions you have with your health care provider. Document Released: 03/11/2004 Document Revised: 03/10/2016 Document Reviewed: 03/10/2016 Elsevier Interactive Patient Education  2017 [employer].

## 2018-04-21 LAB — ANA: Anti Nuclear Antibody(ANA): NEGATIVE

## 2018-04-21 LAB — CELIAC DISEASE COMPREHENSIVE PANEL WITH REFLEXES
(tTG) Ab, IgA: 1 U/mL
Immunoglobulin A: 208 mg/dL (ref 20–320)

## 2018-04-21 LAB — SJOGRENS SYNDROME-B EXTRACTABLE NUCLEAR ANTIBODY: SSB (La) (ENA) Antibody, IgG: <1 AI

## 2018-04-21 LAB — RNP ANTIBODY: Ribonucleic Protein(ENA) Antibody, IgG: <1 AI

## 2018-04-21 LAB — ANTI-DNA ANTIBODY, DOUBLE-STRANDED: ds DNA Ab: 2 IU/mL

## 2018-04-21 LAB — CYCLIC CITRUL PEPTIDE ANTIBODY, IGG: Cyclic Citrullin Peptide Ab: <16 UNITS

## 2018-04-21 LAB — SJOGRENS SYNDROME-A EXTRACTABLE NUCLEAR ANTIBODY: SSA (Ro) (ENA) Antibody, IgG: <1 AI

## 2018-04-23 ENCOUNTER — Telehealth: Payer: Self-pay

## 2018-04-23 NOTE — Telephone Encounter (Signed)
Pt has been informed to pick up meclizine, get allergy. Pt was informed of MyChart message for all this information as well. Pt voiced understanding & agreed.

## 2018-04-23 NOTE — Telephone Encounter (Signed)
-----   Message from Vicie Mutters, [place] sent at 04/23/2018 11:45 AM EDT ----- Regarding: RE: DIZZINESS Had normal CT brain recently.  Monitor BP, take meclizine that she would have as needed up to 3 x a day, if she does not please resend for her.  Make sure she is on nasal spray and allergy pill.   She was informed to call 911 if she develop any new symptoms such as worsening headaches, episodes of blurred vision, double vision or complete loss of vision or speech difficulties or motor weakness.  Estill Bamberg  ----- Message ----- From: Elenor Quinones, CMA Sent: 04/23/2018  10:00 AM EDT To: Vicie Mutters, PA-C Subject: DIZZINESS                                      Per yellow note: DIZZINESS   Pt reports Having DIZZINESS says that she is not sure what to take but the rooming is spinning.  Pharmacy: [facility] [address]

## 2018-04-28 ENCOUNTER — Encounter: Payer: Self-pay | Admitting: Internal Medicine

## 2018-04-28 ENCOUNTER — Ambulatory Visit: Payer: Medicare Other | Admitting: Internal Medicine

## 2018-04-28 VITALS — BP 124/76 | HR 80 | Temp 97.5°F | Resp 16 | Ht 63.5 in | Wt 179.6 lb

## 2018-04-28 DIAGNOSIS — I10 Essential (primary) hypertension: Secondary | ICD-10-CM | POA: Diagnosis not present

## 2018-04-28 DIAGNOSIS — E559 Vitamin D deficiency, unspecified: Secondary | ICD-10-CM | POA: Diagnosis not present

## 2018-04-28 DIAGNOSIS — R7309 Other abnormal glucose: Secondary | ICD-10-CM

## 2018-04-28 DIAGNOSIS — E782 Mixed hyperlipidemia: Secondary | ICD-10-CM | POA: Diagnosis not present

## 2018-04-28 DIAGNOSIS — E039 Hypothyroidism, unspecified: Secondary | ICD-10-CM

## 2018-04-28 DIAGNOSIS — Z79899 Other long term (current) drug therapy: Secondary | ICD-10-CM

## 2018-04-28 NOTE — Progress Notes (Signed)
This very nice 67 y.o. [name] presents for 6 month follow up with HTN, HLD, Pre-Diabetes, GERD, Crohn's Dz, Hypothyroidism, OSA/CPAP,  and Vitamin D Deficiency. Patient's Crystal [name] is controlled on her meds. Patient has OSA & does not use her CPAP/BiPAP.  She has a Chronic Pain Syndrome consequent of multiple Neck & back surgeries & is on Cymbalta/Gabapentin to avoid chronic Opioids. 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     Patient is treated for HTN (2006) & BP has been controlled at home. Today's BP is at goal -  124/76. Patient has had no complaints of any cardiac type chest pain, palpitations, dyspnea / orthopnea / PND, dizziness, claudication, or dependent edema.     Hyperlipidemia is controlled with diet & meds. Patient denies myalgias or other med SE's. Last Lipids were at goal:  Lab Results  Component Value Date   CHOL 170 02/10/2018   HDL 63 02/10/2018   LDLCALC 90 02/10/2018   TRIG 79 02/10/2018   CHOLHDL 2.7 02/10/2018      Also, the patient has Morbid Obesity (BMI 31+) and history of  T2_DM (diet controlled) w/CKD3 (GFR 59). A1c was 6.7% in Dec 2016.  and has had no symptoms of reactive hypoglycemia, diabetic polys, paresthesias or visual blurring.  Last A1c was not at goal: Lab Results  Component Value Date   HGBA1C 6.2 (H) 02/10/2018      Further, the patient also has history of Vitamin D Deficiency and supplements vitamin D without any suspected side-effects. Last vitamin D was sl elevated: Lab Results  Component Value Date   VD25OH 107 (H) 02/10/2018   Current Outpatient Medications on File Prior to Visit  Medication Sig  . aspirin EC 81 MG tablet Take 81 mg by mouth daily.  . bisoprolol-hydrochlorothiazide (ZIAC) 10-6.25 MG tablet Take 1 tablet daily for BP  . budesonide (ENTOCORT EC) 3 MG 24 hr capsule Take 3 mg by mouth daily.  [name] buPROPion (WELLBUTRIN XL) 300 MG 24 hr tablet TAKE 1 TABLET BY MOUTH EVERY MORNING FOR MOOD AND FOCUS  ATTENTION  . CIPRODEX OTIC suspension PLACE 4 DROPS INTO THE RIGHT EAR 2 (TWO) TIMES DAILY.  . clotrimazole-betamethasone (LOTRISONE) cream Apply 1 application topically 2 (two) times daily.  . cyclobenzaprine (FLEXERIL) 5 MG tablet TAKE 1 TABLET BY MOUTH 2 TIMES DAILY AS NEEDED FOR MUSCLE SPASMS. (Patient taking differently: TAKE 1 TABLET BY MOUTH 1 TIME DAILY AS NEEDED FOR MUSCLE SPASMS.)  . diclofenac sodium (VOLTAREN) 1 % GEL Apply 2 g topically 2 (two) times daily as needed.   . DULoxetine (CYMBALTA) 60 MG capsule TAKE 1 CAPSULE BY MOUTH 2 TIMES A DAY FOR PAIN.  . fenofibrate micronized (LOFIBRA) 134 MG capsule TAKE 1 CAPSULE BY MOUTH DAILY BEFORE BREAKFAST  . ferrous sulfate 325 (65 FE) MG tablet Take 325 mg by mouth daily.   . fluticasone (FLONASE) 50 MCG/ACT nasal spray USE TWO SPRAYS IN EACH NOSTRIL DAILY AS NEEDED  . gabapentin (NEURONTIN) 300 MG capsule TAKE ONE CAPSULE 3 TIMES A DAY  . ketoconazole (NIZORAL) 2 % cream Apply 1 application topically 2 (two) times daily.  [name] levothyroxine (SYNTHROID, LEVOTHROID) 100 MCG tablet Take 100 mcg by mouth daily before breakfast.  . meclizine (ANTIVERT) 25 MG tablet TAKE 1 TABLET (25 MG TOTAL) BY MOUTH 3 (THREE) TIMES DAILY AS NEEDED FOR DIZZINESS.  [name] nystatin (MYCOSTATIN) 100000 UNIT/ML suspension 5 ml four times a day, retain in mouth as  long as possible (Swish and Spit).  Use for 48 hours after symptoms resolve.  [name] oxybutynin (DITROPAN) 5 MG tablet TAKE 1 TABLET BY MOUTH TWICE A DAY  . polyethylene glycol (MIRALAX / GLYCOLAX) packet Take 17 g by mouth daily as needed for mild constipation.  [name] rOPINIRole (REQUIP) 2 MG tablet TAKE 1 TABLET 4 TIMES A DAY FOR RESTLESS LEG SYNDROME  . telmisartan (MICARDIS) 20 MG tablet TAKE 1 TABLET BY MOUTH EVERY DAY  . TRAMADOL HCL ER PO Take by mouth.  . Vitamin D, Ergocalciferol, (DRISDOL) 50000 units CAPS capsule TAKE 1 CAPSULE (50,000 UNITS TOTAL) BY MOUTH DAILY.   No current facility-administered  medications on file prior to visit.    Allergies  Allergen Reactions  . Atorvastatin Other (See Comments)    Extreme pain  . Codeine Other (See Comments)    Headache  . Pravastatin Other (See Comments)    Exreme pain  . Statins Other (See Comments)    Extreme pain  . Amitriptyline Other (See Comments)    Unspecified  . Fish Oil Nausea Only  . Nuvigil [Armodafinil] Other (See Comments)    Unspecified  . Erythromycin Other (See Comments)    Reaction unspecified  . Erythromycin Base Rash  . Flax Seed [Bio-Flax] Rash    Linseed  . Hydrocodone-Acetaminophen Rash  . Morphine Nausea And Vomiting and Rash  . Nsaids Nausea Only  . Sertraline Rash  . Sinemet [Carbidopa W-Levodopa] Rash  . Sulfamethoxazole Rash   PMHx:   Past Medical History:  Diagnosis Date  . Arthritis   . ASCVD (arteriosclerotic cardiovascular disease)   . Closed nondisplaced subtrochanteric fracture of left femur (HCC) 09/03/2017   September 2018.  Status post surgery by Dr. Carola Frost  . Depression   . Fibromyalgia   . Gait disorder 10/26/2012  . GERD (gastroesophageal reflux disease)   . GI bleed   . Hyperlipidemia   . Hypertension   . Hypothyroid   . Periprosthetic supracondylar fracture of femur, left  03/23/2017  . Restless leg syndrome   . Rhinitis 06/25/2010  . Sleep apnea   . TIA (transient ischemic attack)    3        . Vitamin D deficiency    Immunization History  Administered Date(s) Administered  . DT 01/03/2015  . Influenza Whole 03/30/2010  . Influenza, High Dose Seasonal PF 03/13/2015  . Influenza-Unspecified 03/31/2011, 05/14/2014, 02/27/2016  . Pneumococcal Conjugate-13 04/17/2015  . Pneumococcal Polysaccharide-23 01/06/2017  . Pneumococcal-Unspecified 06/30/2005  . Td 06/30/2004  . Zoster 07/01/2007   Past Surgical History:  Procedure Laterality Date  . ABDOMINAL HYSTERECTOMY  1992  . ANTERIOR CERVICAL DECOMPRESSION/DISCECTOMY FUSION 4 LEVELS N/A 06/14/2015   Procedure: Cervical  three-four Cervical four-five Cervical five- six Cervical six- seven  Anterior cervical decompression/diskectomy/fusion;  Surgeon: Maeola Harman, MD;  Location: [facility];  Service: Neurosurgery;  Laterality: N/A;  C3-4 C4-5 C5-6 C6-7 Anterior cervical decompression/diskectomy/fusion  . APPENDECTOMY  1960  . CESAREAN SECTION  1983 & 1886  . JOINT REPLACEMENT     2   LEFT  1 RIGHT   . KNEE SURGERY    . ORIF FEMUR FRACTURE Left 03/24/2017   Procedure: OPEN REDUCTION INTERNAL FIXATION (ORIF) FEMUR FRACTURE;  Surgeon: Myrene Galas, MD;  Location: [facility];  Service: Orthopedics;  Laterality: Left;  . ROTATOR CUFF REPAIR Left 2005   2 LEFT  1  RIGHT  . SHOULDER ADHESION RELEASE    . SPINE SURGERY    . TOE  SURGERY     LEFT       . TOE SURGERY     RIGHT  BIG TOE  . TONSILLECTOMY  1960   FHx:    Reviewed / unchanged  SHx:    Reviewed / unchanged   Systems Review:  Constitutional: Denies fever, chills, wt changes, headaches, insomnia, fatigue, night sweats, change in appetite. Eyes: Denies redness, blurred vision, diplopia, discharge, itchy, watery eyes.  ENT: Denies discharge, congestion, post nasal drip, epistaxis, sore throat, earache, hearing loss, dental pain, tinnitus, vertigo, sinus pain, snoring.  CV: Denies chest pain, palpitations, irregular heartbeat, syncope, dyspnea, diaphoresis, orthopnea, PND, claudication or edema. Respiratory: denies cough, dyspnea, DOE, pleurisy, hoarseness, laryngitis, wheezing.  Gastrointestinal: Denies dysphagia, odynophagia, heartburn, reflux, water brash, abdominal pain or cramps, nausea, vomiting, bloating, diarrhea, constipation, hematemesis, melena, hematochezia  or hemorrhoids. Genitourinary: Denies dysuria, frequency, urgency, nocturia, hesitancy, discharge, hematuria or flank pain. Musculoskeletal: Denies arthralgias, myalgias, stiffness, jt. swelling, pain, limping or strain/sprain.  Skin: Denies pruritus, rash, hives, warts, acne, eczema or  change in skin lesion(s). Neuro: No weakness, tremor, incoordination, spasms, paresthesia or pain. Psychiatric: Denies confusion, memory loss or sensory loss. Endo: Denies change in weight, skin or hair change.  Heme/Lymph: No excessive bleeding, bruising or enlarged lymph nodes.  Physical Exam  BP 124/76   Pulse 80   Temp (!) 97.5 F (36.4 C)   Resp 16   Ht 5' 3.5" (1.613 m)   Wt 179 lb 9.6 oz (81.5 kg)   BMI 31.32 kg/m   Appears  well nourished, well groomed  and in no distress. Mild Rt hemi-facial paresis.   Eyes: PERRLA, EOMs, conjunctiva no swelling or erythema. Sinuses: No frontal/maxillary tenderness ENT/Mouth: EAC's clear, TM's nl w/o erythema, bulging. Nares clear w/o erythema, swelling, exudates. Oropharynx clear without erythema or exudates. Oral hygiene is good. Tongue normal, non obstructing. Hearing intact.  Neck: Supple. Thyroid not palpable. Car 2+/2+ without bruits, nodes or JVD. Chest: Respirations nl with BS clear & equal w/o rales, rhonchi, wheezing or stridor.  Cor: Heart sounds normal w/ regular rate and rhythm without sig. murmurs, gallops, clicks or rubs. Peripheral pulses normal and equal  without edema.  Abdomen: Soft & bowel sounds normal. Non-tender w/o guarding, rebound, hernias, masses or organomegaly.  Lymphatics: Unremarkable.  Musculoskeletal: Full ROM all peripheral extremities, joint stability, 5/5 strength and normal gait.  Skin: Warm, dry without exposed rashes, lesions or ecchymosis apparent.  Neuro: Cranial nerves intact, reflexes equal bilaterally. Sensory-motor testing grossly intact. Tendon reflexes grossly intact.  Pysch: Alert & oriented x 3.  Insight and judgement nl & appropriate. No ideations.  Assessment and Plan:  1. Essential hypertension  - Continue medication, monitor blood pressure at home.  - Continue DASH diet.  Reminder to go to the ER if any CP,  SOB, nausea, dizziness, severe HA, changes vision/speech.  - CBC with  Differential/Platelet - COMPLETE METABOLIC PANEL WITH GFR - Magnesium  2. Hyperlipidemia, mixed  - Continue diet/meds, exercise,& lifestyle modifications.  - Continue monitor periodic cholesterol/liver & renal functions    3. Abnormal glucose  - Continue diet, exercise,  - lifestyle modifications.  - Monitor appropriate labs. - Insulin, random  4. Vitamin D deficiency  - Continue supplementation.  5. Hypothyroidism  6. Medication management  - CBC with Differential/Platelet - COMPLETE METABOLIC PANEL WITH GFR - Magnesium - Insulin, random      Discussed  regular exercise, BP monitoring, weight control to achieve/maintain BMI less than 25 and discussed med  and SE's. Recommended labs to assess and monitor clinical status with further disposition pending results of labs. (Lipid, TSH & A1c have been done in the last 90 days). Over 30 minutes of exam, counseling, chart review was performed.

## 2018-04-29 ENCOUNTER — Other Ambulatory Visit: Payer: Self-pay | Admitting: Internal Medicine

## 2018-04-29 LAB — CBC WITH DIFFERENTIAL/PLATELET
Basophils Absolute: 88 cells/uL (ref 0–200)
Basophils Relative: 1.1 %
Eosinophils Absolute: 96 cells/uL (ref 15–500)
Eosinophils Relative: 1.2 %
HCT: 40.9 % (ref 35.0–45.0)
Hemoglobin: 13.6 g/dL (ref 11.7–15.5)
Lymphs Abs: 1504 cells/uL (ref 850–3900)
MCH: 28.6 pg (ref 27.0–33.0)
MCHC: 33.3 g/dL (ref 32.0–36.0)
MCV: 85.9 fL (ref 80.0–100.0)
MPV: 9.2 fL (ref 7.5–12.5)
Monocytes Relative: 8.1 %
Neutro Abs: 5664 cells/uL (ref 1500–7800)
Neutrophils Relative %: 70.8 %
Platelets: 327 10*3/uL (ref 140–400)
RBC: 4.76 10*6/uL (ref 3.80–5.10)
RDW: 13.1 % (ref 11.0–15.0)
Total Lymphocyte: 18.8 %
WBC mixed population: 648 cells/uL (ref 200–950)
WBC: 8 10*3/uL (ref 3.8–10.8)

## 2018-04-29 LAB — INSULIN, RANDOM: Insulin: 3.6 u[IU]/mL (ref 2.0–19.6)

## 2018-04-29 LAB — COMPLETE METABOLIC PANEL WITH GFR
AG Ratio: 1.8 (calc) (ref 1.0–2.5)
ALT: 10 U/L (ref 6–29)
AST: 13 U/L (ref 10–35)
Albumin: 4.2 g/dL (ref 3.6–5.1)
Alkaline phosphatase (APISO): 43 U/L (ref 33–130)
BUN: 19 mg/dL (ref 7–25)
CO2: 31 mmol/L (ref 20–32)
Calcium: 9.7 mg/dL (ref 8.6–10.4)
Chloride: 102 mmol/L (ref 98–110)
Creat: 0.81 mg/dL (ref 0.50–0.99)
GFR, Est African American: 86 mL/min/{1.73_m2} (ref >=60)
GFR, Est Non African American: 74 mL/min/{1.73_m2} (ref >=60)
Globulin: 2.3 g/dL (calc) (ref 1.9–3.7)
Glucose, Bld: 104 mg/dL — ABNORMAL HIGH (ref 65–99)
Potassium: 4 mmol/L (ref 3.5–5.3)
Sodium: 143 mmol/L (ref 135–146)
Total Bilirubin: 0.5 mg/dL (ref 0.2–1.2)
Total Protein: 6.5 g/dL (ref 6.1–8.1)

## 2018-04-29 LAB — MAGNESIUM: Magnesium: 1.9 mg/dL (ref 1.5–2.5)

## 2018-05-02 ENCOUNTER — Encounter: Payer: Self-pay | Admitting: Internal Medicine

## 2018-05-02 NOTE — Patient Instructions (Signed)

## 2018-05-13 ENCOUNTER — Other Ambulatory Visit: Payer: Self-pay

## 2018-05-13 ENCOUNTER — Telehealth: Payer: Self-pay | Admitting: Physician Assistant

## 2018-05-13 DIAGNOSIS — K1379 Other lesions of oral mucosa: Secondary | ICD-10-CM

## 2018-05-13 MED ORDER — LIDOCAINE HCL URETHRAL/MUCOSAL 2 % EX GEL: CUTANEOUS | 0 refills | Status: DC

## 2018-05-13 NOTE — Telephone Encounter (Signed)
Patient complaining of mouth ulcers, has seen derm, will send to rheumatology to rule out behcet's. Will send in lidocaine for you now.

## 2018-05-14 NOTE — Telephone Encounter (Signed)
Pt aware & rx faxed

## 2018-05-15 ENCOUNTER — Other Ambulatory Visit: Payer: Self-pay | Admitting: Internal Medicine

## 2018-05-15 DIAGNOSIS — B370 Candidal stomatitis: Secondary | ICD-10-CM

## 2018-05-15 DIAGNOSIS — R682 Dry mouth, unspecified: Secondary | ICD-10-CM

## 2018-05-15 MED ORDER — NYSTATIN 100000 UNIT/ML MT SUSP: OROMUCOSAL | 3 refills | Status: DC

## 2018-05-17 ENCOUNTER — Other Ambulatory Visit: Payer: Self-pay | Admitting: Internal Medicine

## 2018-05-20 ENCOUNTER — Encounter: Payer: Self-pay | Admitting: Internal Medicine

## 2018-05-20 ENCOUNTER — Ambulatory Visit: Payer: Medicare Other | Admitting: Internal Medicine

## 2018-05-20 VITALS — BP 114/76 | HR 80 | Temp 97.3°F | Resp 16 | Ht 63.5 in | Wt 177.4 lb

## 2018-05-20 DIAGNOSIS — K219 Gastro-esophageal reflux disease without esophagitis: Secondary | ICD-10-CM | POA: Diagnosis not present

## 2018-05-20 MED ORDER — ESOMEPRAZOLE MAGNESIUM 40 MG PO CPDR: DELAYED_RELEASE_CAPSULE | ORAL | 3 refills | Status: DC

## 2018-05-20 NOTE — Patient Instructions (Signed)
Food Choices for Gastroesophageal Reflux Disease, Adult When you have gastroesophageal reflux disease (GERD), the foods you eat and your eating habits are very important. Choosing the right foods can help ease the discomfort of GERD. Consider working with a diet and nutrition specialist (dietitian) to help you make healthy food choices. What general guidelines should I follow? Eating plan  Choose healthy foods low in fat, such as fruits, vegetables, whole grains, low-fat dairy products, and lean meat, fish, and poultry.  Eat frequent, small meals instead of three large meals each day. Eat your meals slowly, in a relaxed setting. Avoid bending over or lying down until 2-3 hours after eating.  Limit high-fat foods such as fatty meats or fried foods.  Limit your intake of oils, butter, and shortening to less than 8 teaspoons each day.  Avoid the following: ? Foods that cause symptoms. These may be different for different people. Keep a food diary to keep track of foods that cause symptoms. ? Alcohol. ? Drinking large amounts of liquid with meals. ? Eating meals during the 2-3 hours before bed.  Cook foods using methods other than frying. This may include baking, grilling, or broiling. Lifestyle   Maintain a healthy weight. Ask your health care provider what weight is healthy for you. If you need to lose weight, work with your health care provider to do so safely.  Exercise for at least 30 minutes on 5 or more days each week, or as told by your health care provider.  Avoid wearing clothes that fit tightly around your waist and chest.  Do not use any products that contain nicotine or tobacco, such as cigarettes and e-cigarettes. If you need help quitting, ask your health care provider.  Sleep with the head of your bed raised. Use a wedge under the mattress or blocks under the bed frame to raise the head of the bed. What foods are not recommended? The items listed may not be a complete  list. Talk with your dietitian about what dietary choices are best for you. Grains Pastries or quick breads with added fat. French toast. Vegetables Deep fried vegetables. French fries. Any vegetables prepared with added fat. Any vegetables that cause symptoms. For some people this may include tomatoes and tomato products, chili peppers, onions and garlic, and horseradish. Fruits Any fruits prepared with added fat. Any fruits that cause symptoms. For some people this may include citrus fruits, such as oranges, grapefruit, pineapple, and lemons. Meats and other protein foods High-fat meats, such as fatty beef or pork, hot dogs, ribs, ham, sausage, salami and bacon. Fried meat or protein, including fried fish and fried chicken. Nuts and nut butters. Dairy Whole milk and chocolate milk. Sour cream. Cream. Ice cream. Cream cheese. Milk shakes. Beverages Coffee and tea, with or without caffeine. Carbonated beverages. Sodas. Energy drinks. Fruit juice made with acidic fruits (such as orange or grapefruit). Tomato juice. Alcoholic drinks. Fats and oils Butter. Margarine. Shortening. Ghee. Sweets and desserts Chocolate and cocoa. Donuts. Seasoning and other foods Pepper. Peppermint and spearmint. Any condiments, herbs, or seasonings that cause symptoms. For some people, this may include curry, hot sauce, or vinegar-based salad dressings. Summary  When you have gastroesophageal reflux disease (GERD), food and lifestyle choices are very important to help ease the discomfort of GERD.  Eat frequent, small meals instead of three large meals each day. Eat your meals slowly, in a relaxed setting. Avoid bending over or lying down until 2-3 hours after eating.  Limit high-fat   foods such as fatty meat or fried foods. This information is not intended to replace advice given to you by your health care provider. Make sure you discuss any questions you have with your health care provider. Document Released:  06/16/2005 Document Revised: 06/17/2016 Document Reviewed: 06/17/2016 Elsevier Interactive Patient Education  2018 [employer]  

## 2018-05-20 NOTE — Progress Notes (Signed)
Subjective:    Patient ID: Crystal [name], female    DOB: September 30, 1950, 67 y.o.   MRN: [id number]  HPI     This nice 67 yo with multiple co-morbidities including not the least of which are Crohn's for which she's on Entocort EC and also GERD for which she's taking Dexilant 2015 mg low dose. Today she's c/o increased heartburn & reflux sx's. She contacted her GI Dr for assistance and per her report he reported back after conferring with his "Pharmacist Specialist" who reviewed her med list and implicated her Ziac, Micardis and Oxybutynin as possible items affecting her reflux.  She also admits eating chocolate and occasionally has peppermint schnapps. In addition she frequently has banana smoothies. Has occasional Nausea. Also occas to frequent diarrhea which she attributes to her Crohn's.   Medication Sig  . aspirin EC 81 MG tablet Take 81 mg by mouth daily.  . bisoprolol-hydrochlorothiazide (ZIAC) 10-6.25 MG tablet Take 1 tablet daily for BP  . budesonide (ENTOCORT EC) 3 MG 24 hr capsule Take 3 mg by mouth daily.  [name] buPROPion (WELLBUTRIN XL) 300 MG 24 hr tablet TAKE 1 TABLET BY MOUTH EVERY MORNING FOR MOOD AND FOCUS ATTENTION  . cyclobenzaprine (FLEXERIL) 5 MG tablet taking differently: TAKE 1 TABLET BY MOUTH 1 TIME DAILY AS NEEDED FOR MUSCLE SPASMS.)  . diclofenac sodium (VOLTAREN) 1 % GEL Apply 2 g topically 2 (two) times daily as needed.   . DULoxetine (CYMBALTA) 60 MG capsule TAKE 1 CAPSULE BY MOUTH 2 TIMES A DAY FOR PAIN.  . fenofibrate micronized (LOFIBRA) 134 MG capsule TAKE 1 CAPSULE BY MOUTH DAILY BEFORE BREAKFAST  . ferrous sulfate 325 (65 FE) MG tablet Take 325 mg by mouth daily.   [name] FLONASE nasal spray USE TWO SPRAYS IN  NOSTRIL DAILY   . gabapentin (NEURONTIN) 300 MG capsule TAKE ONE CAPSULE 3 TIMES A DAY  . levothyroxine100 MCG tablet Take 100 mcg by mouth daily before breakfast.  . lidocaine (XYLOCAINE) 2 % jelly apply small amount topically to affected area 2-3 x/ day  .  meclizine (ANTIVERT) 25 MG tablet TAKE 1 TABLET 3  TIMES DAILY AS NEEDED  . mupirocin ointment (BACTROBAN) 2 % Place 1 application into the nose 2 (two) times daily.  [name] nystatin (MYCOSTATIN) 100000 UNIT/ML suspension 5 ml four times a day, retain in mouth as long as possible (Swish and Spit).  Use for 48 hours after symptoms resolve.  .  fiber  lax Takes 1-2 tablespoons daily.  [name] oxybutynin  5 MG tablet TAKE 1 TABLET BY MOUTH TWICE A DAY  . polyethylene glycol  packet Take 17 g  daily as needed for mild constipation.  [name] rOPINIRole  2 MG tablet TAKE 1 TABLET 4 TIMES A DAY FOR RESTLESS LEG SYNDROME  . telmisartan 20 MG tablet TAKE 1 TABLET BY MOUTH EVERY DAY  . TRAMADOL HCL ER PO Take by mouth.  . Vitamin D 50,000 units CAPS  TAKE 1 CAPSULE (50,000 UNITS TOTAL) BY MOUTH DAILY.  [name] LOTRISONE cream Apply 1 application topically 2 (two) times daily.  [name] ketoconazole (NIZORAL) 2 % cream Apply 1 application topically 2 (two) times daily.   Allergies  Allergen Reactions  . Atorvastatin Other (See Comments)    Extreme pain  . Codeine Other (See Comments)    Headache  . Pravastatin Other (See Comments)    Exreme pain  . Statins Other (See Comments)    Extreme pain  . Amitriptyline Other (See Comments)  Unspecified  . Fish Oil Nausea Only  . Nuvigil [Armodafinil] Other (See Comments)    Unspecified  . Erythromycin Other (See Comments)    Reaction unspecified  . Erythromycin Base Rash  . Flax Seed [Bio-Flax] Rash    Linseed  . Hydrocodone-Acetaminophen Rash  . Morphine Nausea And Vomiting and Rash  . Nsaids Nausea Only  . Sertraline Rash  . Sinemet [Carbidopa W-Levodopa] Rash  . Sulfamethoxazole Rash   Past Medical History:  Diagnosis Date  . Arthritis   . ASCVD (arteriosclerotic cardiovascular disease)   . Closed nondisplaced subtrochanteric fracture of left femur (HCC) 09/03/2017   September 2018.  Status post surgery by Dr. Carola Frost  . Depression   . Fibromyalgia   . Gait disorder  10/26/2012  . GERD (gastroesophageal reflux disease)   . GI bleed   . Hyperlipidemia   . Hypertension   . Hypothyroid   . Periprosthetic supracondylar fracture of femur, left  03/23/2017  . Restless leg syndrome   . Rhinitis 06/25/2010  . Sleep apnea   . TIA (transient ischemic attack)    3        . Vitamin D deficiency    Past Surgical History:  Procedure Laterality Date  . ABDOMINAL HYSTERECTOMY  1992  . ANTERIOR CERVICAL DECOMPRESSION/DISCECTOMY FUSION 4 LEVELS N/A 06/14/2015   Procedure: Cervical three-four Cervical four-five Cervical five- six Cervical six- seven  Anterior cervical decompression/diskectomy/fusion;  Surgeon: Maeola Harman, MD;  Location: [facility];  Service: Neurosurgery;  Laterality: N/A;  C3-4 C4-5 C5-6 C6-7 Anterior cervical decompression/diskectomy/fusion  . APPENDECTOMY  1960  . CESAREAN SECTION  1983 & 1886  . JOINT REPLACEMENT     2   LEFT  1 RIGHT   . KNEE SURGERY    . ORIF FEMUR FRACTURE Left 03/24/2017   Procedure: OPEN REDUCTION INTERNAL FIXATION (ORIF) FEMUR FRACTURE;  Surgeon: Myrene Galas, MD;  Location: [facility];  Service: Orthopedics;  Laterality: Left;  . ROTATOR CUFF REPAIR Left 2005   2 LEFT  1  RIGHT  . SHOULDER ADHESION RELEASE    . SPINE SURGERY    . TOE SURGERY     LEFT       . TOE SURGERY     RIGHT  BIG TOE  . TONSILLECTOMY  1960   Review of Systems    10 point systems review negative except as above.    Objective:   Physical Exam  BP 114/76   Pulse 80   Temp (!) 97.3 F (36.3 C)   Resp 16   Ht 5' 3.5" (1.613 m)   Wt 177 lb 6.4 oz (80.5 kg)   BMI 30.93 kg/m   HEENT - WNL. Neck - supple.  Chest - Clear equal BS. Cor - Nl HS. RRR w/o sig m. Abd - Soft, benign. Non-tender w/o G or RB. BS Nl. MS- FROM w/o deformities.  Gait Nl. Neuro -  Nl w/o focal abnormalities except mild Rt hemi-facial paresis.    Assessment & Plan:   1. Gastroesophageal reflux disease, esophagitis presence not specified  - increase Dexilant 30  mg to 2 caps / day til use up then switch to   - esomeprazole (NEXIUM) 40 MG capsule; Take 1 capsule Daily for Acid indigestion & Reflux  Dispense: 90 capsule; Refill: 3  - discussed med / SE's.  - long discussion reviewing diet triggers to stop Bananas, chocolate & peppermint.   - Also recommended stop the Oxybutynin which apparently she's been on for 7  years for excessive sweating.

## 2018-05-21 ENCOUNTER — Other Ambulatory Visit: Payer: Self-pay | Admitting: Adult Health

## 2018-05-24 ENCOUNTER — Ambulatory Visit: Payer: Medicare Other | Admitting: Adult Health Nurse Practitioner

## 2018-05-24 ENCOUNTER — Encounter: Payer: Self-pay | Admitting: Adult Health Nurse Practitioner

## 2018-05-24 VITALS — BP 124/86 | HR 99 | Temp 97.4°F | Ht 63.5 in

## 2018-05-24 DIAGNOSIS — R7309 Other abnormal glucose: Secondary | ICD-10-CM

## 2018-05-24 DIAGNOSIS — H6593 Unspecified nonsuppurative otitis media, bilateral: Secondary | ICD-10-CM

## 2018-05-24 DIAGNOSIS — J0100 Acute maxillary sinusitis, unspecified: Secondary | ICD-10-CM | POA: Diagnosis not present

## 2018-05-24 DIAGNOSIS — J301 Allergic rhinitis due to pollen: Secondary | ICD-10-CM | POA: Diagnosis not present

## 2018-05-24 DIAGNOSIS — H7293 Unspecified perforation of tympanic membrane, bilateral: Secondary | ICD-10-CM

## 2018-05-24 DIAGNOSIS — B370 Candidal stomatitis: Secondary | ICD-10-CM

## 2018-05-24 MED ORDER — FLUCONAZOLE 150 MG PO TABS: 150.0000 mg | ORAL_TABLET | Freq: Every day | ORAL | 0 refills | Status: DC

## 2018-05-24 MED ORDER — AZITHROMYCIN 250 MG PO TABS: ORAL_TABLET | ORAL | 1 refills | Status: DC

## 2018-05-24 NOTE — Progress Notes (Signed)
Assessment and Plan:  Crystal [name] was seen today for acute visit, cough and sore throat.  Diagnoses and all orders for this visit:  Acute maxillary sinusitis, recurrence not specified Continue Mucinex D, monitor blood pressure.  If elevation occurs, discontinue. Increase water intake -     azithromycin (ZITHROMAX) 250 MG tablet; Take 2 tablets (500 mg) on  Day 1,  followed by 1 tablet (250 mg) once daily on Days 2 through 5.  Allergic rhinitis due to pollen, unspecified seasonality Continue Flonase daily Use Neti Pot BID. Use bottled or distilled water only.  DO NOT USE TAP WATER!  Use prior to any nasal sprays.  Abnormal glucose Monitor dietary intake Low carbohydrate and decrease sugars  Oral Candiadiasis Resolving, continue using Nystain mouth rinse. Monitor symptoms -     fluconazole (DIFLUCAN) 150 MG tablet; Take 1 tablet (150 mg total) by mouth daily at onset of yeast symptoms.  Serous otitis media of both ears with rupture of tympanic membrane Take OTC Zyrtec May make you sleepy, take at night  Call or return with new or worsening symptoms as discussed in appointment.  May contact via office phone [phone] or via MyChart.   Further disposition pending results of labs. Discussed med's effects and SE's.   Over 30 minutes of exam, counseling, chart review, and critical decision making was performed.   Future Appointments  Date Time Provider Department Center  08/03/2018 11:00 AM Judd Gaudier, NP [facility] None  11/08/2018 10:00 AM [name], MD [facility] None    ------------------------------------------------------------------------------------------------------------------   HPI 67 y.o.female presents for sickness that began on Friday.  She reports that two days agao she has had a fever.  She is taking mucinex with some relief and that has been helping.  Reports that her voice is raspy and reports that she has some upper throat soreness that comes and  goes.  She has some nasal congestion and uses flonase daily.  She is having some mild dizziness and ear fullness.  Past Medical History:  Diagnosis Date  . Arthritis   . ASCVD (arteriosclerotic cardiovascular disease)   . Closed nondisplaced subtrochanteric fracture of left femur (HCC) 09/03/2017   September 2018.  Status post surgery by Dr. Carola Frost  . Depression   . Fibromyalgia   . Gait disorder 10/26/2012  . GERD (gastroesophageal reflux disease)   . GI bleed   . Hyperlipidemia   . Hypertension   . Hypothyroid   . Periprosthetic supracondylar fracture of femur, left  03/23/2017  . Restless leg syndrome   . Rhinitis 06/25/2010  . Sleep apnea   . TIA (transient ischemic attack)    3        . Vitamin D deficiency      Allergies  Allergen Reactions  . Atorvastatin Other (See Comments)    Extreme pain  . Codeine Other (See Comments)    Headache  . Pravastatin Other (See Comments)    Exreme pain  . Statins Other (See Comments)    Extreme pain  . Amitriptyline Other (See Comments)    Unspecified  . Fish Oil Nausea Only  . Nuvigil [Armodafinil] Other (See Comments)    Unspecified  . Erythromycin Other (See Comments)    Reaction unspecified  . Erythromycin Base Rash  . Flax Seed [Bio-Flax] Rash    Linseed  . Hydrocodone-Acetaminophen Rash  . Morphine Nausea And Vomiting and Rash  . Nsaids Nausea Only  . Sertraline Rash  . Sinemet [Carbidopa W-Levodopa] Rash  . Sulfamethoxazole Rash  Current Outpatient Medications on File Prior to Visit  Medication Sig  . aspirin EC 81 MG tablet Take 81 mg by mouth daily.  . bisoprolol-hydrochlorothiazide (ZIAC) 10-6.25 MG tablet Take 1 tablet daily for BP  . budesonide (ENTOCORT EC) 3 MG 24 hr capsule Take 3 mg by mouth daily.  [name] buPROPion (WELLBUTRIN XL) 300 MG 24 hr tablet TAKE 1 TABLET BY MOUTH EVERY MORNING FOR MOOD AND FOCUS ATTENTION  . cyclobenzaprine (FLEXERIL) 5 MG tablet TAKE 1 TABLET BY MOUTH 2 TIMES DAILY AS NEEDED FOR  MUSCLE SPASMS. (Patient taking differently: TAKE 1 TABLET BY MOUTH 1 TIME DAILY AS NEEDED FOR MUSCLE SPASMS.)  . diclofenac sodium (VOLTAREN) 1 % GEL Apply 2 g topically 2 (two) times daily as needed.   . DULoxetine (CYMBALTA) 60 MG capsule TAKE 1 CAPSULE BY MOUTH 2 TIMES A DAY FOR PAIN.  . fenofibrate micronized (LOFIBRA) 134 MG capsule TAKE 1 CAPSULE BY MOUTH DAILY BEFORE BREAKFAST  . ferrous sulfate 325 (65 FE) MG tablet Take 325 mg by mouth daily.   . fluticasone (FLONASE) 50 MCG/ACT nasal spray USE TWO SPRAYS IN EACH NOSTRIL DAILY AS NEEDED  . gabapentin (NEURONTIN) 300 MG capsule TAKE ONE CAPSULE 3 TIMES A DAY  . levothyroxine (SYNTHROID, LEVOTHROID) 100 MCG tablet Take 100 mcg by mouth daily before breakfast.  . lidocaine (XYLOCAINE) 2 % jelly apply small amount topically to affected area 2-3 times a day  . meclizine (ANTIVERT) 25 MG tablet TAKE 1 TABLET (25 MG TOTAL) BY MOUTH 3 (THREE) TIMES DAILY AS NEEDED FOR DIZZINESS.  . mupirocin ointment (BACTROBAN) 2 % Place 1 application into the nose 2 (two) times daily.  [name] nystatin (MYCOSTATIN) 100000 UNIT/ML suspension 5 ml four times a day, retain in mouth as long as possible (Swish and Spit).  Use for 48 hours after symptoms resolve.  [name] OVER THE COUNTER MEDICATION Takes fiber 1-2 tablespoons daily.  . polyethylene glycol (MIRALAX / GLYCOLAX) packet Take 17 g by mouth daily as needed for mild constipation.  [name] rOPINIRole (REQUIP) 2 MG tablet TAKE 1 TABLET 4 TIMES A DAY FOR RESTLESS LEG SYNDROME  . telmisartan (MICARDIS) 20 MG tablet TAKE 1 TABLET BY MOUTH EVERY DAY  . TRAMADOL HCL ER PO Take by mouth.  . Vitamin D, Ergocalciferol, (DRISDOL) 50000 units CAPS capsule TAKE 1 CAPSULE (50,000 UNITS TOTAL) BY MOUTH DAILY.  [name] esomeprazole (NEXIUM) 40 MG capsule Take 1 capsule Daily for Acid indigestion & Reflux (Patient not taking: Reported on 05/24/2018)   No current facility-administered medications on file prior to visit.     ROS: all negative  except above.  Review of Systems  Constitutional: Positive for diaphoresis, fever and weight loss. Negative for chills and malaise/fatigue.  HENT: Positive for congestion, ear pain, sinus pain and sore throat. Negative for ear discharge, hearing loss, nosebleeds and tinnitus.   Eyes: Positive for pain, discharge and redness. Negative for blurred vision, double vision and photophobia.  Respiratory: Positive for cough and sputum production. Negative for hemoptysis, shortness of breath, wheezing and stridor.   Gastrointestinal: Negative for abdominal pain, blood in stool, constipation, diarrhea, heartburn, melena, nausea and vomiting.  Genitourinary: Negative for dysuria, flank pain, frequency, hematuria and urgency.  Skin: Negative for itching and rash.  Neurological: Positive for dizziness. Negative for tingling, tremors, sensory change, speech change, focal weakness, seizures, loss of consciousness, weakness and headaches.    Physical Exam:  BP 124/86   Pulse 99   Temp (!) 97.4 F (36.3 C)  Ht 5' 3.5" (1.613 m)   SpO2 99%   BMI 30.93 kg/m   General Appearance: Well nourished, in no apparent distress. Eyes: PERRLA, EOMs, conjunctiva no swelling or erythema Sinuses: No Frontal tenderness, positive maxillary tenderness, bilateral. ENT/Mouth: Ext aud canals clear, TMs without erythema, bulging. Serous noted behind bilateral TM's. No erythema, swelling, or exudate on post pharynx.  Tonsils not swollen or erythematous. Hearing normal. Maxillary sinus tenderness. Neck: Supple, thyroid normal.  Respiratory: Respiratory effort normal, BS equal bilaterally without rales, rhonchi, wheezing or stridor.  Cardio: RRR with no MRGs. Brisk peripheral pulses without edema.  Abdomen: Soft, + BS.  Non tender, no guarding, rebound, hernias, masses. Lymphatics: Non tender without lymphadenopathy.  Musculoskeletal: Full ROM, 5/5 strength, normal gait.  Skin: Warm, dry without rashes, lesions, ecchymosis.   Neuro: Cranial nerves intact. Normal muscle tone, no cerebellar symptoms. Sensation intact.  Psych: Awake and oriented X 3, normal affect, Insight and Judgment appropriate.     Elder Negus, NP 9:07 AM [facility]

## 2018-05-24 NOTE — Patient Instructions (Addendum)
Try Mucinex D, nasal decongestant, monitor blood pressure, if elevated discontinue  We have sent in Azithromyacin pack  Take 2 tablets today and then one tablet by mouth daily until complete Continue using Nystatin mouth wash as discussed Will call in Diflucan 150mg , take one tablet at onset of increasing yeast symptoms Use a Neti Pot twice a day.  Use bottled or distilled water only, DO NOT USE TAP WATER. This will help to sooth your sinuses. Over the counter Zyrtec, take one tablet nightly to help dry up fluid in your ears, may help with dizziness.  Call or return with new or worsening symptoms as discussed in appointment.  May contact via office phone [phone] or via MyChart.   Sinusitis, Adult Sinusitis is soreness and inflammation of your sinuses. Sinuses are hollow spaces in the bones around your face. They are located:  Around your eyes.  In the middle of your forehead.  Behind your nose.  In your cheekbones.  Your sinuses and nasal passages are lined with a stringy fluid (mucus). Mucus normally drains out of your sinuses. When your nasal tissues get inflamed or swollen, the mucus can get trapped or blocked so air cannot flow through your sinuses. This lets bacteria, viruses, and funguses grow, and that leads to infection. Follow these instructions at home: Medicines  Take, use, or apply over-the-counter and prescription medicines only as told by your doctor. These may include nasal sprays.  If you were prescribed an antibiotic medicine, take it as told by your doctor. Do not stop taking the antibiotic even if you start to feel better. Hydrate and Humidify  Drink enough water to keep your pee (urine) clear or pale yellow.  Use a cool mist humidifier to keep the humidity level in your home above 50%.  Breathe in steam for 10-15 minutes, 3-4 times a day or as told by your doctor. You can do this in the bathroom while a hot shower is running.  Try not to spend time in  cool or dry air. Rest  Rest as much as possible.  Sleep with your head raised (elevated).  Make sure to get enough sleep each night. General instructions  Put a warm, moist washcloth on your face 3-4 times a day or as told by your doctor. This will help with discomfort.  Wash your hands often with soap and water. If there is no soap and water, use hand sanitizer.  Do not smoke. Avoid being around people who are smoking (secondhand smoke).  Keep all follow-up visits as told by your doctor. This is important. Contact a doctor if:  You have a fever.  Your symptoms get worse.  Your symptoms do not get better within 10 days. Get help right away if:  You have a very bad headache.  You cannot stop throwing up (vomiting).  You have pain or swelling around your face or eyes.  You have trouble seeing.  You feel confused.  Your neck is stiff.  You have trouble breathing. This information is not intended to replace advice given to you by your health care provider. Make sure you discuss any questions you have with your health care provider. Document Released: 12/03/2007 Document Revised: 02/10/2016 Document Reviewed: 04/11/2015 Elsevier Interactive Patient Education  [employer].

## 2018-05-31 ENCOUNTER — Encounter: Payer: Self-pay | Admitting: Adult Health Nurse Practitioner

## 2018-06-01 ENCOUNTER — Encounter: Payer: Self-pay | Admitting: Internal Medicine

## 2018-06-01 ENCOUNTER — Ambulatory Visit: Payer: Medicare Other | Admitting: Internal Medicine

## 2018-06-01 VITALS — BP 114/66 | HR 87 | Temp 97.6°F | Ht 63.5 in | Wt 178.4 lb

## 2018-06-01 DIAGNOSIS — J0140 Acute pansinusitis, unspecified: Secondary | ICD-10-CM | POA: Diagnosis not present

## 2018-06-01 DIAGNOSIS — J0410 Acute tracheitis without obstruction: Secondary | ICD-10-CM | POA: Diagnosis not present

## 2018-06-01 MED ORDER — LEVOFLOXACIN 500 MG PO TABS: ORAL_TABLET | ORAL | 1 refills | Status: DC

## 2018-06-01 MED ORDER — PREDNISONE 20 MG PO TABS: ORAL_TABLET | ORAL | 0 refills | Status: DC

## 2018-06-01 MED ORDER — PROMETHAZINE-DM 6.25-15 MG/5ML PO SYRP: ORAL_SOLUTION | ORAL | 1 refills | Status: DC

## 2018-06-01 NOTE — Progress Notes (Signed)
Subjective:    Patient ID: Crystal [name], female    DOB: Aug 11, 1950, 67 y.o.   MRN: [id number]  HPI     Patient is a delightful 67 yo [name] who was treated about a week ago for sinusitis  with a z pak & she returns today with additional c/o chest congestion and productive cough . She relates upcoming elective hand surg in 3 days with Dr Mina Marble and also apparently scheduled for Rt  Shoulder surg 07/22/2019 at [facility].   Denies fever, chills or dyspnea. Sputum is reported yellowish green & thick.   Medication Sig  . aspirin EC 81 MG tablet Take 81 mg by mouth daily.  . bisoprolol-hctz 10-6.25 MG tablet Take 1 tablet daily for BP  . ENTOCORT EC 3 MG 24 hr capsule Take 3 mg by mouth daily.  [name] buPROPion-XL 300 MG TAKE 1 TABLET BY MOUTH EVERY MORNING FOR MOOD AND FOCUS ATTENTION  . cyclobenzaprine  5 MG tablet TAKE 1 TABLET BY MOUTH 2 TIMES DAILY AS NEEDED   . diclofenac1 % GEL Apply 2 g topically 2 (two) times daily as needed.   . DULoxetine 60 MG capsule TAKE 1 CAPSULE BY MOUTH 2 TIMES A DAY FOR PAIN.  [name] Esomeprazole 40 MG capsule Take 1 capsule Daily for Acid indigestion & Reflux  . fenofibrate  134 MG capsule TAKE 1 CAPSULE BY MOUTH DAILY BEFORE BREAKFAST  . ferrous sulfate 325 (65 FE) MG tablet Take 325 mg by mouth daily.   . fluconazole  150 MG tablet Take 1 tablet (150 mg total) by mouth daily.  [name] FLONASE nasal spray USE TWO SPRAYS IN EACH NOSTRIL DAILY AS NEEDED  . gabapentin  300 MG capsule TAKE ONE CAPSULE 3 TIMES A DAY  . levothyroxine (SYNTHROID, LEVOTHROID) 100 MCG tablet Take 100 mcg by mouth daily before breakfast.  . lidocaine (XYLOCAINE) 2 % jelly apply small amount topically to affected area 2-3 times a day  . meclizine (ANTIVERT) 25 MG tablet TAKE 1 TABLET (25 MG TOTAL) BY MOUTH 3 (THREE) TIMES DAILY AS NEEDED FOR DIZZINESS.  [name] BACTROBAN 2 % Place 1 application into the nose 2 (two) times daily.  [name] MYCOSTATIN 100000 UNIT/ML susp 5 ml four times a day, retain in mouth as long as  possible (Swish and Spit).  Use for 48 hours after symptoms resolve.  [name] OVER THE COUNTER MEDICATION Takes fiber 1-2 tablespoons daily.  [name] MIRALAX Take 17 g by mouth daily as needed for mild constipation.  [name] rOPINIRole  2 MG tablet TAKE 1 TABLET 4 TIMES A DAY   . telmisartan  20 MG tablet TAKE 1 TABLET BY MOUTH EVERY DAY  . TRAMADOL HCL ER Take by mouth.  . Vitamin D 50,000 units  TAKE 1 CAPSULE  DAILY.   Allergies  Allergen Reactions  . Atorvastatin Other (See Comments)    Extreme pain  . Codeine Other (See Comments)    Headache  . Pravastatin Other (See Comments)    Exreme pain  . Statins Other (See Comments)    Extreme pain  . Amitriptyline Other (See Comments)    Unspecified  . Fish Oil Nausea Only  . Nuvigil [Armodafinil] Other (See Comments)    Unspecified  . Erythromycin Other (See Comments)    Reaction unspecified  . Erythromycin Base Rash  . Flax Seed [Bio-Flax] Rash    Linseed  . Hydrocodone-Acetaminophen Rash  . Morphine Nausea And Vomiting and Rash  . Nsaids Nausea Only  . Sertraline  Rash  . Sinemet [Carbidopa W-Levodopa] Rash  . Sulfamethoxazole Rash   Past Medical History:  Diagnosis Date  . Arthritis   . ASCVD (arteriosclerotic cardiovascular disease)   . Closed nondisplaced subtrochanteric fracture of left femur (HCC) 09/03/2017   September 2018.  Status post surgery by Dr. Carola Frost  . Depression   . Fibromyalgia   . Gait disorder 10/26/2012  . GERD (gastroesophageal reflux disease)   . GI bleed   . Hyperlipidemia   . Hypertension   . Hypothyroid   . Periprosthetic supracondylar fracture of femur, left  03/23/2017  . Restless leg syndrome   . Rhinitis 06/25/2010  . Sleep apnea   . TIA (transient ischemic attack)    3        . Vitamin D deficiency    Past Surgical History:  Procedure Laterality Date  . ABDOMINAL HYSTERECTOMY  1992  . ANTERIOR CERVICAL DECOMPRESSION/DISCECTOMY FUSION 4 LEVELS N/A 06/14/2015   Procedure: Cervical three-four Cervical  four-five Cervical five- six Cervical six- seven  Anterior cervical decompression/diskectomy/fusion;  Surgeon: Maeola Harman, MD;  Location: [facility];  Service: Neurosurgery;  Laterality: N/A;  C3-4 C4-5 C5-6 C6-7 Anterior cervical decompression/diskectomy/fusion  . APPENDECTOMY  1960  . CESAREAN SECTION  1983 & 1886  . JOINT REPLACEMENT     2   LEFT  1 RIGHT   . KNEE SURGERY    . ORIF FEMUR FRACTURE Left 03/24/2017   Procedure: OPEN REDUCTION INTERNAL FIXATION (ORIF) FEMUR FRACTURE;  Surgeon: Myrene Galas, MD;  Location: [facility] OR;  Service: Orthopedics;  Laterality: Left;  . ROTATOR CUFF REPAIR Left 2005   2 LEFT  1  RIGHT  . SHOULDER ADHESION RELEASE    . SPINE SURGERY    . TOE SURGERY     LEFT       . TOE SURGERY     RIGHT  BIG TOE  . TONSILLECTOMY  1960     Review of Systems     10 point systems review negative except as above    Objective:   Physical Exam   BP 114/66   Pulse 87   Temp 97.6 F (36.4 C)   Ht 5' 3.5" (1.613 m)   Wt 178 lb 6.4 oz (80.9 kg)   SpO2 99%   BMI 31.11 kg/m  O2 sat is 99%.  In No Distress. No stridor. Speech hoarse. Cough congested.   HEENT - Eac's patent. TM's Nl. EOM's full. PERRLA.  NasoOroPharynx clear. Fronto-maxillary sinuses (+) tender.  Neck - supple. Nl Thyroid. Chest - Clear equal BS w/scatter coarse inspiratory rales, expiratory rhonchi & wheezes. Cor - Nl HS. RRR w/o sig m. No edema. MS- FROM w/o deformities. Muscle power, tone and bulk Nl. Gait Nl. Neuro - No obvious Cr N abnormalities. Sensory, motor and Cerebellar functions appear Nl w/o focal abnormalities. Skin - exposed clear w/o rash cyanosis or icterus.    Assessment & Plan:   1. Tracheitis  2. Subacute pansinusitis  - predniSONE (DELTASONE) 20 MG tablet; 1 tab 3 x day for 3 days, then 1 tab 2 x day for 3 days, then 1 tab 1 x day for 5 days  Dispense: 20 tablet  - levofloxacin (LEVAQUIN) 500 MG tablet; Take 1 tablet daily with food for infection  Dispense: 15  tablet; Refill: 1  - promethazine-dextromethorphan (PROMETHAZINE-DM) 6.25-15 MG/5ML syrup; Take 1 to 2 tsp every 4 hours if needed for cough  Dispense: 360 mL; Refill: 1

## 2018-06-09 ENCOUNTER — Telehealth: Payer: Self-pay | Admitting: *Deleted

## 2018-06-09 MED ORDER — OXYBUTYNIN CHLORIDE 5 MG PO TABS: 5.0000 mg | ORAL_TABLET | Freq: Two times a day (BID) | ORAL | 1 refills | Status: DC

## 2018-06-09 NOTE — Telephone Encounter (Signed)
Patient called and states Dr Kinnie Scales stopped her Oxybutynin, as a possible cause for her GERD. The patient is having excessive sweating, since off the medication, and states no improvement in her GERD.  Per Dr Oneta Rack, it is OK to restart her Oxybutynin and a refill has been sent to her pharmacy.

## 2018-06-12 ENCOUNTER — Other Ambulatory Visit: Payer: Self-pay | Admitting: Internal Medicine

## 2018-06-12 DIAGNOSIS — M797 Fibromyalgia: Secondary | ICD-10-CM

## 2018-06-13 ENCOUNTER — Other Ambulatory Visit: Payer: Self-pay | Admitting: Internal Medicine

## 2018-06-16 ENCOUNTER — Encounter: Payer: Self-pay | Admitting: Podiatry

## 2018-06-16 ENCOUNTER — Encounter: Payer: Self-pay | Admitting: Pulmonary Disease

## 2018-06-16 ENCOUNTER — Ambulatory Visit (INDEPENDENT_AMBULATORY_CARE_PROVIDER_SITE_OTHER): Payer: Medicare Other | Admitting: Podiatry

## 2018-06-16 ENCOUNTER — Ambulatory Visit: Payer: Medicare Other | Admitting: Pulmonary Disease

## 2018-06-16 VITALS — BP 110/68 | HR 87 | Ht 64.0 in | Wt 182.0 lb

## 2018-06-16 DIAGNOSIS — M7751 Other enthesopathy of right foot: Secondary | ICD-10-CM | POA: Diagnosis not present

## 2018-06-16 DIAGNOSIS — M779 Enthesopathy, unspecified: Secondary | ICD-10-CM

## 2018-06-16 DIAGNOSIS — Z9989 Dependence on other enabling machines and devices: Secondary | ICD-10-CM

## 2018-06-16 DIAGNOSIS — G4733 Obstructive sleep apnea (adult) (pediatric): Secondary | ICD-10-CM | POA: Diagnosis not present

## 2018-06-16 DIAGNOSIS — J479 Bronchiectasis, uncomplicated: Secondary | ICD-10-CM | POA: Diagnosis not present

## 2018-06-16 DIAGNOSIS — M7752 Other enthesopathy of left foot: Secondary | ICD-10-CM

## 2018-06-16 MED ORDER — TRIAMCINOLONE ACETONIDE 10 MG/ML IJ SUSP
10.0000 mg | Freq: Once | INTRAMUSCULAR | Status: AC
  Administered 2018-06-16: 10 mg

## 2018-06-16 NOTE — Patient Instructions (Signed)
Will arrange for [facility] to change you to auto CPAP setting  Follow up in 6 months

## 2018-06-16 NOTE — Progress Notes (Signed)
Subjective:   Patient ID: Crystal [name], female   DOB: 67 y.o.   MRN: [id number]   HPI Patient states last injections did not help and I am due to have surgery on other parts of my body and are desperate for some relief   ROS      Objective:  Physical Exam  Neurovascular status intact with patient's right second and third MPJs and left second MPJ being inflamed and sore with patient having structural malalignment     Assessment:  Inflammatory capsulitis second MPJ bilateral mild third right     Plan:  Today I did reinject the joints and I discussed ultimately surgery to shorten the metatarsals but she cannot do anything short-term due to her other issues that she is experiencing.  Patient will be seen back as needed

## 2018-06-16 NOTE — Progress Notes (Signed)
[facility]  Chief Complaint  Patient presents with  . Follow-up    Pt has a cpap machine, but not used it since Aug 2019. Pt has chronic sinus infections, SOB with exertion, and some wheezing in last few weeks.    Constitutional:  BP 110/68 (BP Location: Left Arm, Cuff Size: Normal)   Pulse 87   Ht 5' 4"  (1.626 m)   Wt 182 lb (82.6 kg)   SpO2 95%   BMI 31.24 kg/m   Past Medical History:  Vit D deficiency, TIA, RLS, Hypothyroidism, HTN, HLD, GERD, Fibromyalgia, Depression, CAD, Crohn's disease  Brief Summary:  Crystal [name] is a 67 y.o. female with obstructive sleep apnea, and bronchiectasis in setting of reflux.  She was previously seen by Dr. Corrie Dandy.  Has trouble with CPAP mask.  Has nasal collapse and difficult to use mask.  Getting new mask from DME.  Also wants to change to auto CPAP.  Was tx with ABx recently for chest cold.  Getting better.  Was seen by Dr. Lucia Gaskins with ENT.  She will schedule appointment for f/u.  Not having chest pain, fever, hemoptysis.  Cough is getting better and not bringing up sputum.  Wheezing better.  She feels like her nose plugs up and then she has a hard time breathing.  Physical Exam:   Appearance - well kempt   ENMT - clear nasal mucosa, midline nasal  septum, no oral exudates, no LAN, trachea midline, facial droop on Rt, alar collapse with narrow nasal angles  Respiratory - normal chest wall, normal respiratory effort, no accessory muscle use, no wheeze/rales  CV - s1s2 regular rate and rhythm, no murmurs, no peripheral edema, radial pulses symmetric  GI - soft, non tender, no masses  Lymph - no adenopathy noted in neck and axillary areas  MSK - normal gait  Ext - no cyanosis, clubbing, or joint inflammation noted  Skin - no rashes, lesions, or ulcers  Neuro - normal strength, oriented x 3  Psych - normal mood and affect  Assessment/Plan:   Obstructive sleep apnea. - she  will get a new CPAP mask - will have [facility] change her to auto CPAP setting  Nasal congestion with narrow nasal angles and alar collapse. - she will f/u with Dr. Lucia Gaskins with ENT  Regional bronchiectasis in setting of reflux. - monitor clinically  - she is followed by Dr. Earlean Shawl with GI for her reflux    Patient Instructions  Will arrange for [facility] to change you to auto CPAP setting  Follow up in 6 months  Time spent 31 minutes  [name], MD [place] Pager: [phone] 06/16/2018, 4:27 PM  Flow Sheet     Pulmonary tests:  PFT 11/21/15 >> FEV1 2.51 (110%), FEV1% 85%, TLC 4.36 (88%), DLCO 77% CT chest 10/01/17 >> 3 mm LLL nodule, mild cylindrical BTX Rt side  Sleep tests:  HST 12/04/15 >> 30.7, SpO2 low 73%  Cardiac tests:  Echo 05/16/13 >> EF 50 to 55%, mild MR  Medications:   Allergies as of 06/16/2018      Reactions   Atorvastatin Other (See Comments)   Extreme pain   Codeine Other (See Comments)   Headache   Pravastatin Other (See Comments)   Exreme pain   Statins Other (See Comments)   Extreme pain   Amitriptyline Other (See Comments)   Unspecified   Fish Oil Nausea Only   Linseed Oil Other (See  Comments)   Other Reaction: Other reaction   Nuvigil [armodafinil] Other (See Comments)   Unspecified   Sulfa Antibiotics Other (See Comments)   Erythromycin Other (See Comments)   Reaction unspecified   Erythromycin Base Rash   Flax Seed [bio-flax] Rash   Linseed   Hydrocodone-acetaminophen Rash   Morphine Nausea And Vomiting, Rash   Nsaids Nausea Only   Sertraline Rash   Sinemet [carbidopa W-levodopa] Rash   Sulfamethoxazole Rash      Medication List       Accurate as of June 16, 2018  4:27 PM. Always use your most recent med list.        aspirin EC 81 MG tablet Take 81 mg by mouth daily.   bisoprolol-hydrochlorothiazide 10-6.25 MG tablet Commonly known as:  ZIAC Take 1 tablet daily for BP     budesonide 3 MG 24 hr capsule Commonly known as:  ENTOCORT EC Take 3 mg by mouth daily.   buPROPion 300 MG 24 hr tablet Commonly known as:  WELLBUTRIN XL TAKE 1 TABLET BY MOUTH EVERY MORNING FOR MOOD AND FOCUS ATTENTION   cyclobenzaprine 5 MG tablet Commonly known as:  FLEXERIL TAKE 1 TABLET BY MOUTH 2 TIMES DAILY AS NEEDED FOR MUSCLE SPASMS.   diclofenac sodium 1 % Gel Commonly known as:  VOLTAREN Apply 2 g topically 2 (two) times daily as needed.   DULoxetine 60 MG capsule Commonly known as:  CYMBALTA TAKE 1 CAPSULE BY MOUTH 2 TIMES A DAY FOR PAIN.   esomeprazole 40 MG capsule Commonly known as:  NEXIUM Take 1 capsule Daily for Acid indigestion & Reflux   fenofibrate micronized 134 MG capsule Commonly known as:  LOFIBRA TAKE 1 CAPSULE BY MOUTH DAILY BEFORE BREAKFAST   ferrous sulfate 325 (65 FE) MG tablet Take 325 mg by mouth daily.   fluconazole 150 MG tablet Commonly known as:  DIFLUCAN Take 1 tablet (150 mg total) by mouth daily.   fluticasone 50 MCG/ACT nasal spray Commonly known as:  FLONASE USE TWO SPRAYS IN EACH NOSTRIL DAILY AS NEEDED   gabapentin 300 MG capsule Commonly known as:  NEURONTIN TAKE ONE CAPSULE 3 TIMES A DAY   levofloxacin 500 MG tablet Commonly known as:  LEVAQUIN Take 1 tablet daily with food for infection   levothyroxine 100 MCG tablet Commonly known as:  SYNTHROID, LEVOTHROID Take 100 mcg by mouth daily before breakfast.   lidocaine 2 % jelly Commonly known as:  XYLOCAINE apply small amount topically to affected area 2-3 times a day   meclizine 25 MG tablet Commonly known as:  ANTIVERT TAKE 1 TABLET (25 MG TOTAL) BY MOUTH 3 (THREE) TIMES DAILY AS NEEDED FOR DIZZINESS.   mupirocin ointment 2 % Commonly known as:  BACTROBAN Place 1 application into the nose 2 (two) times daily.   nystatin 100000 UNIT/ML suspension Commonly known as:  MYCOSTATIN 5 ml four times a day, retain in mouth as long as possible (Swish and Spit).  Use  for 48 hours after symptoms resolve.   OVER THE COUNTER MEDICATION Takes fiber 1-2 tablespoons daily.   oxybutynin 5 MG tablet Commonly known as:  DITROPAN Take 1 tablet (5 mg total) by mouth 2 (two) times daily.   polyethylene glycol packet Commonly known as:  MIRALAX / GLYCOLAX Take 17 g by mouth daily as needed for mild constipation.   predniSONE 20 MG tablet Commonly known as:  DELTASONE 1 tab 3 x day for 3 days, then 1 tab 2 x day for  3 days, then 1 tab 1 x day for 5 days   promethazine-dextromethorphan 6.25-15 MG/5ML syrup Commonly known as:  PROMETHAZINE-DM Take 1 to 2 tsp every 4 hours if needed for cough   rOPINIRole 2 MG tablet Commonly known as:  REQUIP TAKE 1 TABLET 4 TIMES A DAY FOR RESTLESS LEG SYNDROME   telmisartan 20 MG tablet Commonly known as:  MICARDIS TAKE 1 TABLET BY MOUTH EVERY DAY   TRAMADOL HCL ER PO Take by mouth.   Vitamin D (Ergocalciferol) 1.25 MG (50000 UT) Caps capsule Commonly known as:  DRISDOL TAKE 1 CAPSULE (50,000 UNITS TOTAL) BY MOUTH DAILY.       Past Surgical History:  She  has a past surgical history that includes Shoulder adhesion release; Spine surgery; Appendectomy (1960); Cesarean section ([place]); Abdominal hysterectomy (1992); Rotator cuff repair (Left, 2005); Tonsillectomy (1960); Joint replacement; Toe Surgery; Toe Surgery; Anterior cervical decompression/discectomy fusion 4 level (N/A, 06/14/2015); ORIF femur fracture (Left, 03/24/2017); and Knee surgery.  Family History:  Her family history includes Breast cancer in her mother; Cancer in her mother; Depression in her sister; Diabetes in her mother; Heart disease in her father; Hyperlipidemia in her father; Hypertension in her father; Leukemia in her sister; Lupus in her sister; Migraines in her daughter; Neuropathy in her father; Osteoporosis in her mother; Rheum arthritis in her sister.  Social History:  She  reports that she has never smoked. She has never used  smokeless tobacco. She reports current alcohol use. She reports that she does not use drugs.

## 2018-06-20 ENCOUNTER — Emergency Department (HOSPITAL_BASED_OUTPATIENT_CLINIC_OR_DEPARTMENT_OTHER)
Admission: RE | Admit: 2018-06-20 | Discharge: 2018-06-20 | Disposition: A | Discharge status: Home / Self Care | Payer: Medicare Other | Source: Ambulatory Visit | Attending: Emergency Medicine | Admitting: Emergency Medicine

## 2018-06-20 ENCOUNTER — Emergency Department (HOSPITAL_COMMUNITY)
Admission: EM | Admit: 2018-06-20 | Discharge: 2018-06-20 | Disposition: A | Discharge status: Home / Self Care | Payer: Medicare Other | Attending: Emergency Medicine | Admitting: Emergency Medicine

## 2018-06-20 ENCOUNTER — Encounter (HOSPITAL_COMMUNITY): Payer: Self-pay | Admitting: Emergency Medicine

## 2018-06-20 ENCOUNTER — Emergency Department (HOSPITAL_COMMUNITY): Payer: Medicare Other

## 2018-06-20 DIAGNOSIS — M79604 Pain in right leg: Secondary | ICD-10-CM | POA: Diagnosis present

## 2018-06-20 DIAGNOSIS — E039 Hypothyroidism, unspecified: Secondary | ICD-10-CM | POA: Diagnosis not present

## 2018-06-20 DIAGNOSIS — Z7982 Long term (current) use of aspirin: Secondary | ICD-10-CM | POA: Diagnosis not present

## 2018-06-20 DIAGNOSIS — M7989 Other specified soft tissue disorders: Secondary | ICD-10-CM

## 2018-06-20 DIAGNOSIS — I10 Essential (primary) hypertension: Secondary | ICD-10-CM | POA: Diagnosis not present

## 2018-06-20 DIAGNOSIS — M7121 Synovial cyst of popliteal space [Baker], right knee: Secondary | ICD-10-CM | POA: Insufficient documentation

## 2018-06-20 DIAGNOSIS — Z79899 Other long term (current) drug therapy: Secondary | ICD-10-CM | POA: Insufficient documentation

## 2018-06-20 DIAGNOSIS — M660 Rupture of popliteal cyst: Secondary | ICD-10-CM

## 2018-06-20 LAB — CBC WITH DIFFERENTIAL/PLATELET
Abs Immature Granulocytes: 0.11 10*3/uL — ABNORMAL HIGH (ref 0.00–0.07)
Basophils Absolute: 0.1 10*3/uL (ref 0.0–0.1)
Basophils Relative: 1 %
Eosinophils Absolute: 0.1 10*3/uL (ref 0.0–0.5)
Eosinophils Relative: 1 %
HCT: 43.5 % (ref 36.0–46.0)
Hemoglobin: 13.4 g/dL (ref 12.0–15.0)
Immature Granulocytes: 1 %
Lymphocytes Relative: 18 %
Lymphs Abs: 1.6 10*3/uL (ref 0.7–4.0)
MCH: 28.4 pg (ref 26.0–34.0)
MCHC: 30.8 g/dL (ref 30.0–36.0)
MCV: 92.2 fL (ref 80.0–100.0)
Monocytes Absolute: 0.7 10*3/uL (ref 0.1–1.0)
Monocytes Relative: 8 %
Neutro Abs: 6.5 10*3/uL (ref 1.7–7.7)
Neutrophils Relative %: 71 %
Platelets: 226 10*3/uL (ref 150–400)
RBC: 4.72 MIL/uL (ref 3.87–5.11)
RDW: 14.8 % (ref 11.5–15.5)
WBC: 9.1 10*3/uL (ref 4.0–10.5)
nRBC: 0 % (ref 0.0–0.2)

## 2018-06-20 LAB — BASIC METABOLIC PANEL
Anion gap: 11 (ref 5–15)
BUN: 15 mg/dL (ref 8–23)
CO2: 25 mmol/L (ref 22–32)
Calcium: 8.4 mg/dL — ABNORMAL LOW (ref 8.9–10.3)
Chloride: 105 mmol/L (ref 98–111)
Creatinine, Ser: 0.79 mg/dL (ref 0.44–1.00)
GFR calc Af Amer: >60 mL/min (ref >60)
GFR calc non Af Amer: >60 mL/min (ref >60)
Glucose, Bld: 88 mg/dL (ref 70–99)
Potassium: 3.8 mmol/L (ref 3.5–5.1)
Sodium: 141 mmol/L (ref 135–145)

## 2018-06-20 MED ORDER — ONDANSETRON 4 MG PO TBDP: 4.0000 mg | ORAL_TABLET | Freq: Three times a day (TID) | ORAL | 0 refills | Status: DC | PRN

## 2018-06-20 MED ORDER — DOCUSATE SODIUM 250 MG PO CAPS: 250.0000 mg | ORAL_CAPSULE | Freq: Every day | ORAL | 0 refills | Status: DC

## 2018-06-20 MED ORDER — OXYCODONE HCL 5 MG PO TABS: 5.0000 mg | ORAL_TABLET | Freq: Four times a day (QID) | ORAL | 0 refills | Status: DC | PRN

## 2018-06-20 NOTE — ED Notes (Signed)
Bed: [id number] Expected date:  Expected time:  Means of arrival:  Comments: Per charge

## 2018-06-20 NOTE — ED Triage Notes (Signed)
Pt reports that she had baker;s cyst behind right knee that doctors wont due anything about. Pt reports that for couple days had pain in right calf with lumps and swelling. Pt reports that before pains in calf started couple days when got out of bed, right knee cap moved sideways and then back in place-reports artifical knee cap.

## 2018-06-20 NOTE — ED Provider Notes (Signed)
[facility] DEPT Provider Note   CSN: [id number] Arrival date & time: 06/20/18  1327     History   Chief Complaint Chief Complaint  Patient presents with  . Leg Pain  . lumps in calf    HPI Crystal [name] is a 67 y.o. female.  HPI 67 yo F with PMHx fibromyalgia, restless leg syndrome, here with right leg swelling. Pt has known Baker's cyst. She reports that several days ago, she felt a pain sensation in her posterior calf and subsequently noticed increased leg swelling and pain. She has been walking on it but has noticed the leg appeared "lumpy" throughout the day today so she is here for evaluation. Denies any h/o DVT despite extensive prior ortho surgeries. No CP or SOB. No palpitations.   Past Medical History:  Diagnosis Date  . Arthritis   . ASCVD (arteriosclerotic cardiovascular disease)   . Closed nondisplaced subtrochanteric fracture of left femur ([place]) 09/03/2017   September 2018.  Status post surgery by Dr. Marcelino Scot  . Depression   . Fibromyalgia   . Gait disorder 10/26/2012  . GERD (gastroesophageal reflux disease)   . GI bleed   . Hyperlipidemia   . Hypertension   . Hypothyroid   . Periprosthetic supracondylar fracture of femur, left  03/23/2017  . Restless leg syndrome   . Rhinitis 06/25/2010  . Sleep apnea   . TIA (transient ischemic attack)    3        . Vitamin D deficiency     Patient Active Problem List   Diagnosis Date Noted  . History of adenomatous polyp of colon 10/18/2017  . Bronchiectasis without complication ([place]) 67/34/1939  . Diverticulosis 10/12/2017  . Internal hemorrhoids 10/12/2017  . Polypharmacy 04/09/2017  . Arthritis of carpometacarpal (CMC) joints of both thumbs 02/12/2017  . Cervical disc disease 01/23/2017  . Constipation 01/23/2017  . Crohn's ileitis ([facility]) 01/23/2017  . Depression 01/23/2017  . TIA (transient ischemic attack) 01/23/2017  . S/P revision of total knee, left 12/18/2016  . B12  deficiency anemia 06/16/2016  . Sleep talking 03/26/2016  . IBS (irritable colon syndrome) 08/09/2015  . Spinal stenosis in cervical region 06/14/2015  . Other specified arthropathy involving shoulder region 11/23/2014  . Morbid obesity (BMI 31.88)  10/31/2014  . Medication management 10/25/2013  . Rotator cuff arthropathy 09/15/2013  . Hyperlipidemia   . T2_NIDDM   . Hypothyroid   . Depression, major, recurrent, in partial remission ([place])   . Vitamin D deficiency   . ASCVD   . Gait disorder 10/26/2012  . Knee pain 09/07/2012  . Rotator cuff tear 08/23/2012  . Bilateral bunions 11/19/2011  . Rhinitis 06/25/2010  . Obstructive sleep apnea 06/21/2010  . RESTLESS LEG SYNDROME 06/21/2010  . Essential hypertension 06/21/2010  . GERD 06/21/2010  . Crohn's Enterocolitis / Regional enteritis 06/21/2010  . Fibromyalgia 06/21/2010    Past Surgical History:  Procedure Laterality Date  . ABDOMINAL HYSTERECTOMY  1992  . ANTERIOR CERVICAL DECOMPRESSION/DISCECTOMY FUSION 4 LEVELS N/A 06/14/2015   Procedure: Cervical three-four Cervical four-five Cervical five- six Cervical six- seven  Anterior cervical decompression/diskectomy/fusion;  Surgeon: Erline Levine, MD;  Location: [facility];  Service: Neurosurgery;  Laterality: N/A;  C3-4 C4-5 C5-6 C6-7 Anterior cervical decompression/diskectomy/fusion  . APPENDECTOMY  1960  . [place]  . JOINT REPLACEMENT     2   LEFT  1 RIGHT   . KNEE SURGERY    . ORIF FEMUR  FRACTURE Left 03/24/2017   Procedure: OPEN REDUCTION INTERNAL FIXATION (ORIF) FEMUR FRACTURE;  Surgeon: Altamese [place], MD;  Location: [facility];  Service: Orthopedics;  Laterality: Left;  . ROTATOR CUFF REPAIR Left 2005   2 LEFT  1  RIGHT  . SHOULDER ADHESION RELEASE    . SPINE SURGERY    . TOE SURGERY     LEFT       . TOE SURGERY     RIGHT  BIG TOE  . TONSILLECTOMY  1960     OB History   No obstetric history on file.      Home Medications    Prior to  Admission medications   Medication Sig Start Date End Date Taking? Authorizing Provider  aspirin EC 81 MG tablet Take 81 mg by mouth daily.    [provider]  bisoprolol-hydrochlorothiazide [facility]) 10-6.25 MG tablet Take 1 tablet daily for BP 03/06/18   Unk Pinto, MD  budesonide (ENTOCORT EC) 3 MG 24 hr capsule Take 3 mg by mouth daily.    [provider]  buPROPion (WELLBUTRIN XL) 300 MG 24 hr tablet TAKE 1 TABLET BY MOUTH EVERY MORNING FOR MOOD AND FOCUS ATTENTION 05/21/18   Unk Pinto, MD  cyclobenzaprine (FLEXERIL) 5 MG tablet TAKE 1 TABLET BY MOUTH 2 TIMES DAILY AS NEEDED FOR MUSCLE SPASMS. Patient taking differently: TAKE 1 TABLET BY MOUTH 1 TIME DAILY AS NEEDED FOR MUSCLE SPASMS. 06/01/17   Unk Pinto, MD  diclofenac sodium (VOLTAREN) 1 % GEL Apply 2 g topically 2 (two) times daily as needed.  12/27/16   [provider]  docusate sodium (COLACE) 250 MG capsule Take 1 capsule (250 mg total) by mouth daily. While taking pain medication 06/20/18   Duffy Bruce, MD  DULoxetine (CYMBALTA) 60 MG capsule TAKE 1 CAPSULE BY MOUTH 2 TIMES A DAY FOR PAIN. 06/12/18   Unk Pinto, MD  esomeprazole ([place]) 40 MG capsule Take 1 capsule Daily for Acid indigestion & Reflux 05/20/18   Unk Pinto, MD  fenofibrate micronized (LOFIBRA) 134 MG capsule TAKE 1 CAPSULE BY MOUTH DAILY BEFORE BREAKFAST 02/04/18   Unk Pinto, MD  ferrous sulfate 325 (65 FE) MG tablet Take 325 mg by mouth daily.     [provider]  fluconazole (DIFLUCAN) 150 MG tablet Take 1 tablet (150 mg total) by mouth daily. 05/24/18   Garnet Sierras, NP  fluticasone (FLONASE) 50 MCG/ACT nasal spray USE TWO SPRAYS IN EACH NOSTRIL DAILY AS NEEDED 09/02/16   Unk Pinto, MD  gabapentin (NEURONTIN) 300 MG capsule TAKE ONE CAPSULE 3 TIMES A DAY 05/17/18   [name], NP  levofloxacin (LEVAQUIN) 500 MG tablet Take 1 tablet daily with food for infection 06/01/18   Unk Pinto, MD  levothyroxine (SYNTHROID, LEVOTHROID) 100 MCG tablet Take 100 mcg by mouth daily before breakfast.    [provider]  lidocaine (XYLOCAINE) 2 % jelly apply small amount topically to affected area 2-3 times a day 05/13/18   Vicie Mutters, PA-C  meclizine (ANTIVERT) 25 MG tablet TAKE 1 TABLET (25 MG TOTAL) BY MOUTH 3 (THREE) TIMES DAILY AS NEEDED FOR DIZZINESS. 06/13/18   Unk Pinto, MD  mupirocin ointment (BACTROBAN) 2 % Place 1 application into the nose 2 (two) times daily.    [provider]  nystatin (MYCOSTATIN) 100000 UNIT/ML suspension 5 ml four times a day, retain in mouth as long as possible (Swish and Spit).  Use for 48 hours after symptoms resolve. 05/15/18   Unk Pinto, MD  ondansetron (ZOFRAN ODT) 4 MG disintegrating tablet Take 1 tablet (4 mg total) by mouth every 8 (eight) hours as needed for nausea or vomiting. 06/20/18   Duffy Bruce, MD  OVER THE COUNTER MEDICATION Takes fiber 1-2 tablespoons daily.    [provider]  oxybutynin (DITROPAN) 5 MG tablet Take 1 tablet (5 mg total) by mouth 2 (two) times daily. 06/09/18   Unk Pinto, MD  oxyCODONE (ROXICODONE) 5 MG immediate release tablet Take 1 tablet (5 mg total) by mouth every 6 (six) hours as needed for severe pain. 06/20/18   Duffy Bruce, MD  polyethylene glycol [facility] Floria Raveling) packet Take 17 g by mouth daily as needed for mild constipation. 06/26/15   Domenic Polite, MD  predniSONE (DELTASONE) 20 MG tablet 1 tab 3 x day for 3 days, then 1 tab 2 x day for 3 days, then 1 tab 1 x day for 5 days 06/01/18   Unk Pinto, MD  promethazine-dextromethorphan (PROMETHAZINE-DM) 6.25-15 MG/5ML syrup Take 1 to 2 tsp every 4 hours if needed for cough 06/01/18   Unk Pinto, MD  rOPINIRole (REQUIP) 2 MG tablet TAKE 1 TABLET 4 TIMES A DAY FOR RESTLESS LEG SYNDROME 12/25/17   Unk Pinto, MD  telmisartan (MICARDIS) 20 MG tablet TAKE 1 TABLET BY MOUTH EVERY DAY  02/04/18   Unk Pinto, MD  TRAMADOL HCL ER PO Take by mouth.    [provider]  Vitamin D, Ergocalciferol, (DRISDOL) 50000 units CAPS capsule TAKE 1 CAPSULE (50,000 UNITS TOTAL) BY MOUTH DAILY. 08/07/17   Unk Pinto, MD    Family History Family History  Problem Relation Age of Onset  . Leukemia Sister   . Lupus Sister   . Depression Sister   . Rheum arthritis Sister   . Heart disease Father   . Hypertension Father   . Hyperlipidemia Father   . Neuropathy Father   . Cancer Mother   . Diabetes Mother   . Osteoporosis Mother   . Breast cancer Mother   . Migraines Daughter     Social History Social History   Tobacco Use  . Smoking status: Never Smoker  . Smokeless tobacco: Never Used  Substance Use Topics  . Alcohol use: Yes    Alcohol/week: 0.0 standard drinks    Comment: rarely  . Drug use: No     Allergies   Atorvastatin; Codeine; Pravastatin; Statins; Amitriptyline; Fish oil; Linseed oil; Nuvigil [armodafinil]; Sulfa antibiotics; Erythromycin; Erythromycin base; Flax seed [bio-flax]; Hydrocodone-acetaminophen; Morphine; Nsaids; Sertraline; Sinemet [carbidopa w-levodopa]; and Sulfamethoxazole   Review of Systems Review of Systems  Constitutional: Negative for chills, fatigue and fever.  HENT: Negative for congestion and rhinorrhea.   Eyes: Negative for visual disturbance.  Respiratory: Negative for cough, shortness of breath and wheezing.   Cardiovascular: Positive for leg swelling. Negative for chest pain.  Gastrointestinal: Negative for abdominal pain, diarrhea, nausea and vomiting.  Genitourinary: Negative for dysuria and flank pain.  Musculoskeletal: Positive for gait problem. Negative for neck pain and neck stiffness.  Skin: Negative for rash and wound.  Allergic/Immunologic: Negative for immunocompromised state.  Neurological: Negative for syncope, weakness and headaches.  All other systems reviewed and are negative.    Physical  Exam Updated Vital Signs BP 112/67 (BP Location: Left Arm)   Pulse 76   Temp 98.2 F (36.8 C) (Oral)   Resp 18   Ht 5' 4"  (1.626 m)   Wt 80.7 kg   SpO2 99%   BMI 30.55 kg/m   Physical  Exam Vitals signs and nursing note reviewed.  Constitutional:      General: She is not in acute distress.    Appearance: She is well-developed.  HENT:     Head: Normocephalic and atraumatic.  Eyes:     Conjunctiva/sclera: Conjunctivae normal.  Neck:     Musculoskeletal: Neck supple.  Cardiovascular:     Rate and Rhythm: Normal rate and regular rhythm.     Heart sounds: Normal heart sounds. No murmur. No friction rub.  Pulmonary:     Effort: Pulmonary effort is normal. No respiratory distress.     Breath sounds: Normal breath sounds. No wheezing or rales.  Abdominal:     General: There is no distension.     Palpations: Abdomen is soft.     Tenderness: There is no abdominal tenderness.  Skin:    General: Skin is warm.     Capillary Refill: Capillary refill takes less than 2 seconds.  Neurological:     Mental Status: She is alert and oriented to person, place, and time.     Motor: No abnormal muscle tone.     LOWER EXTREMITY EXAM: RIGHT  INSPECTION & PALPATION: S/p R TKR. Scar well healed. Diffuse edema distal to knee with mild TTP diffusely. Compartments are soft. No erythema. No palpable cords. Palpable swelling along popliteal area diffusely.  SENSORY: sensation is intact to light touch in:  Superficial peroneal nerve distribution (over dorsum of foot) Deep peroneal nerve distribution (over first dorsal web space) Sural nerve distribution (over lateral aspect 5th metatarsal) Saphenous nerve distribution (over medial instep)  MOTOR:  + Motor EHL (great toe dorsiflexion) + FHL (great toe plantar flexion)  + TA (ankle dorsiflexion)  + GSC (ankle plantar flexion)  VASCULAR: 2+ dorsalis pedis and posterior tibialis pulses Capillary refill < 2 sec, toes warm and  well-perfused  COMPARTMENTS: Soft, warm, well-perfused No pain with passive extension No parethesias    ED Treatments / Results  Labs (all labs ordered are listed, but only abnormal results are displayed) Labs Reviewed  CBC WITH DIFFERENTIAL/PLATELET - Abnormal; Notable for the following components:      Result Value   Abs Immature Granulocytes 0.11 (*)    All other components within normal limits  BASIC METABOLIC PANEL - Abnormal; Notable for the following components:   Calcium 8.4 (*)    All other components within normal limits    EKG None  Radiology Dg Knee Complete 4 Views Right  Result Date: 06/20/2018 CLINICAL DATA:  Pain right calf with lumps and swelling a couple of days. EXAM: RIGHT KNEE - COMPLETE 4+ VIEW COMPARISON:  None. FINDINGS: Right total knee arthroplasty intact. No acute fracture or dislocation. No significant joint effusion. IMPRESSION: No acute findings. Right knee arthroplasty intact. Electronically Signed   By: Marin Olp M.D.   On: 06/20/2018 15:48   Vas [place] Lower Extremity Venous (dvt) (only [facility])  Result Date: 06/20/2018  Lower Venous Study Indications: Swelling.  Performing Technologist: Oliver Hum RVT  Examination Guidelines: A complete evaluation includes B-mode imaging, spectral Doppler, color Doppler, and power Doppler as needed of all accessible portions of each vessel. Bilateral testing is considered an integral part of a complete examination. Limited examinations for reoccurring indications may be performed as noted.  Right Venous Findings: +---------+---------------+---------+-----------+----------+-------+          CompressibilityPhasicitySpontaneityPropertiesSummary +---------+---------------+---------+-----------+----------+-------+ CFV      Full           Yes  Yes                          +---------+---------------+---------+-----------+----------+-------+ SFJ      Full                                                  +---------+---------------+---------+-----------+----------+-------+ FV Prox  Full                                                 +---------+---------------+---------+-----------+----------+-------+ FV Mid   Full                                                 +---------+---------------+---------+-----------+----------+-------+ FV DistalFull                                                 +---------+---------------+---------+-----------+----------+-------+ PFV      Full                                                 +---------+---------------+---------+-----------+----------+-------+ POP      Full           Yes      Yes                          +---------+---------------+---------+-----------+----------+-------+ PTV      Full                                                 +---------+---------------+---------+-----------+----------+-------+ PERO     Full                                                 +---------+---------------+---------+-----------+----------+-------+  Left Venous Findings: +---+---------------+---------+-----------+----------+-------+    CompressibilityPhasicitySpontaneityPropertiesSummary +---+---------------+---------+-----------+----------+-------+ CFVFull           Yes      Yes                          +---+---------------+---------+-----------+----------+-------+    Summary: Right: There is no evidence of deep vein thrombosis in the lower extremity. A cystic structure measuring 2.6 cm high by 5.1 cm wide by greater than 5.4 cm long is found in the popliteal fossa. The cystic structure appears to be draining into the medial calf. Left: No evidence of common femoral vein obstruction.  *See table(s) above for measurements and observations.    Preliminary     Procedures Procedures (including critical care time)  Medications Ordered in ED Medications -  No data to display   Initial Impression / Assessment and Plan /  ED Course  I have reviewed the triage vital signs and the nursing notes.  Pertinent labs & imaging results that were available during my care of the patient were reviewed by me and considered in my medical decision making (see chart for details).     67 yo F here with R leg swelling x several days. Suspect ruptured Baker's cyst and imaging confirms this. There is no erythema, fever, or sx to suggest cellulitis or superimposed infection. Distal NV is fully intact. No paresthesias, compartments soft, pain is minimal and I do nto suspect compartment syndrome. DVT study is negative. Will place compression with ACE wrap, elevate leg, advise outpt follow-up.  Final Clinical Impressions(s) / ED Diagnoses   Final diagnoses:  Ruptured Bakers cyst    ED Discharge Orders         Ordered    oxyCODONE (ROXICODONE) 5 MG immediate release tablet  Every 6 hours PRN     06/20/18 1634    ondansetron (ZOFRAN ODT) 4 MG disintegrating tablet  Every 8 hours PRN     06/20/18 1634    docusate sodium (COLACE) 250 MG capsule  Daily     06/20/18 1634           Duffy Bruce, MD 06/20/18 1923

## 2018-06-20 NOTE — Progress Notes (Signed)
Right lower extremity venous duplex has been completed. Negative for DVT. A cystic structure measuring 2.6 cm high by 5.1 cm wide by greater than 5.4 cm long is found in the popliteal fossa.The cystic structure appears to be draining into the medial calf. Results were given to Dr. Erma Heritage.  06/20/18 3:52 PM Olen Cordial RVT

## 2018-06-20 NOTE — ED Notes (Signed)
Bed: [facility] Expected date:  Expected time:  Means of arrival:  Comments: 

## 2018-06-20 NOTE — Discharge Instructions (Addendum)
Keep your leg ELEVATED at all times when sitting. Try to stay mobile but not for long periods of time, to prevent swelling.  Keep a compression dressing on your leg.  If you develop new weakness, difficulty moving your toes, numbness, or worsening pain, return to the ER

## 2018-06-21 ENCOUNTER — Other Ambulatory Visit: Payer: Self-pay | Admitting: Internal Medicine

## 2018-06-21 DIAGNOSIS — G2581 Restless legs syndrome: Secondary | ICD-10-CM

## 2018-06-29 ENCOUNTER — Other Ambulatory Visit (HOSPITAL_COMMUNITY): Payer: Self-pay | Admitting: Orthopedic Surgery

## 2018-06-29 DIAGNOSIS — T8484XA Pain due to internal orthopedic prosthetic devices, implants and grafts, initial encounter: Secondary | ICD-10-CM

## 2018-06-29 DIAGNOSIS — Z96651 Presence of right artificial knee joint: Principal | ICD-10-CM

## 2018-07-04 ENCOUNTER — Encounter: Payer: Self-pay | Admitting: Physician Assistant

## 2018-07-04 DIAGNOSIS — E119 Type 2 diabetes mellitus without complications: Secondary | ICD-10-CM | POA: Insufficient documentation

## 2018-07-04 DIAGNOSIS — E1122 Type 2 diabetes mellitus with diabetic chronic kidney disease: Secondary | ICD-10-CM | POA: Insufficient documentation

## 2018-07-07 ENCOUNTER — Encounter: Payer: Self-pay | Admitting: Adult Health

## 2018-07-07 ENCOUNTER — Ambulatory Visit: Payer: Medicare Other | Admitting: Adult Health

## 2018-07-07 ENCOUNTER — Inpatient Hospital Stay (HOSPITAL_COMMUNITY): Admission: RE | Admit: 2018-07-07 | Payer: Medicare Other | Source: Ambulatory Visit

## 2018-07-07 VITALS — BP 102/54 | HR 106 | Temp 97.5°F

## 2018-07-07 DIAGNOSIS — M7989 Other specified soft tissue disorders: Secondary | ICD-10-CM

## 2018-07-07 DIAGNOSIS — M79661 Pain in right lower leg: Secondary | ICD-10-CM

## 2018-07-07 DIAGNOSIS — B370 Candidal stomatitis: Secondary | ICD-10-CM

## 2018-07-07 DIAGNOSIS — R682 Dry mouth, unspecified: Secondary | ICD-10-CM | POA: Diagnosis not present

## 2018-07-07 MED ORDER — NYSTATIN 100000 UNIT/ML MT SUSP: OROMUCOSAL | 3 refills | Status: DC

## 2018-07-07 NOTE — Progress Notes (Signed)
Assessment and Plan:  Diagnoses and all orders for this visit:  Pain and swelling of right lower leg R/o DVT per ortho surgeon request, discussed treatment should this be positive If neg follow up with ortho regarding recommendations for baker's cyst management Follow up if not resolving; gave ER precautions for any sudden HA, weakness, chest pain, dyspnea.  -     VAS [place] LOWER EXTREMITY VENOUS (DVT); Future  Dry mouth/Thrush -     nystatin (MYCOSTATIN) 100000 UNIT/ML suspension; 5 ml four times a day, retain in mouth as long as possible (Swish and Spit).  Use for 48 hours after symptoms resolve.  Further disposition pending results of labs. Discussed med's effects and SE's.   Over 15 minutes of exam, counseling, chart review, and critical decision making was performed.   Future Appointments  Date Time Provider Department Center  07/16/2018 11:00 AM [facility] [place]  07/16/2018  2:00 PM [facility] [place]  08/03/2018 11:00 AM Judd Gaudier, NP [facility] None  11/08/2018 10:00 AM [name], MD [facility] None    ------------------------------------------------------------------------------------------------------------------   HPI BP (!) 102/54   Pulse (!) 106   Temp (!) 97.5 F (36.4 C)   SpO2 99%   68 y.o.female with hx of arthritis (s/p bilateral knee replacement) fibromyalgia, ASCVD presents for evaluation of R LE edema and lump to the posterior R knee. She reports known baker's cyst x 2 years that she believes "popped" 06/19/2018 - woke up with edema of lower leg, lumpy texture and previous firm cyst was flattened. She discsused with ortho due to upcoming surgery, and reports he requests DVT be ruled. She is on ASA, not on HRT, no recent air travel or long car trips. She reports 1-2 days prior   She has upcoming total shoulder arthroplasty surgery on 07/21/2018 by Dr. Lelon Mast at [facility].   L calf 14 inch, R calf 15 inch and 1/4  Past Medical History:   Diagnosis Date  . Arthritis   . ASCVD (arteriosclerotic cardiovascular disease)   . Closed nondisplaced subtrochanteric fracture of left femur (HCC) 09/03/2017   September 2018.  Status post surgery by Dr. Carola Frost  . Depression   . Fibromyalgia   . Gait disorder 10/26/2012  . GERD (gastroesophageal reflux disease)   . GI bleed   . Hyperlipidemia   . Hypertension   . Hypothyroid   . Periprosthetic supracondylar fracture of femur, left  03/23/2017  . Restless leg syndrome   . Rhinitis 06/25/2010  . Sleep apnea   . TIA (transient ischemic attack)    3        . Vitamin D deficiency      Allergies  Allergen Reactions  . Atorvastatin Other (See Comments)    Extreme pain  . Codeine Other (See Comments)    Headache  . Pravastatin Other (See Comments)    Exreme pain  . Statins Other (See Comments)    Extreme pain  . Amitriptyline Other (See Comments)    Unspecified  . Fish Oil Nausea Only  . Linseed Oil Other (See Comments)    Other Reaction: Other reaction  . Nuvigil [Armodafinil] Other (See Comments)    Unspecified  . Sulfa Antibiotics Other (See Comments)  . Erythromycin Other (See Comments)    Reaction unspecified  . Erythromycin Base Rash  . Flax Seed [Bio-Flax] Rash    Linseed  . Hydrocodone-Acetaminophen Rash  . Morphine Nausea And Vomiting and Rash  . Nsaids Nausea Only  .  Sertraline Rash  . Sinemet [Carbidopa W-Levodopa] Rash  . Sulfamethoxazole Rash    Current Outpatient Medications on File Prior to Visit  Medication Sig  . aspirin EC 81 MG tablet Take 81 mg by mouth daily.  . bisoprolol-hydrochlorothiazide (ZIAC) 10-6.25 MG tablet Take 1 tablet daily for BP  . budesonide (ENTOCORT EC) 3 MG 24 hr capsule Take 3 mg by mouth daily.  [name] buPROPion (WELLBUTRIN XL) 300 MG 24 hr tablet TAKE 1 TABLET BY MOUTH EVERY MORNING FOR MOOD AND FOCUS ATTENTION  . cyclobenzaprine (FLEXERIL) 5 MG tablet TAKE 1 TABLET BY MOUTH 2 TIMES DAILY AS NEEDED FOR MUSCLE SPASMS. (Patient  taking differently: TAKE 1 TABLET BY MOUTH 1 TIME DAILY AS NEEDED FOR MUSCLE SPASMS.)  . diclofenac sodium (VOLTAREN) 1 % GEL Apply 2 g topically 2 (two) times daily as needed.   . docusate sodium (COLACE) 250 MG capsule Take 1 capsule (250 mg total) by mouth daily. While taking pain medication  . DULoxetine (CYMBALTA) 60 MG capsule TAKE 1 CAPSULE BY MOUTH 2 TIMES A DAY FOR PAIN.  . fenofibrate micronized (LOFIBRA) 134 MG capsule TAKE 1 CAPSULE BY MOUTH DAILY BEFORE BREAKFAST  . ferrous sulfate 325 (65 FE) MG tablet Take 325 mg by mouth daily.   . fluticasone (FLONASE) 50 MCG/ACT nasal spray USE TWO SPRAYS IN EACH NOSTRIL DAILY AS NEEDED  . gabapentin (NEURONTIN) 300 MG capsule TAKE ONE CAPSULE 3 TIMES A DAY  . levofloxacin (LEVAQUIN) 500 MG tablet Take 1 tablet daily with food for infection  . levothyroxine (SYNTHROID, LEVOTHROID) 100 MCG tablet Take 100 mcg by mouth daily before breakfast.  . lidocaine (XYLOCAINE) 2 % jelly apply small amount topically to affected area 2-3 times a day  . meclizine (ANTIVERT) 25 MG tablet TAKE 1 TABLET (25 MG TOTAL) BY MOUTH 3 (THREE) TIMES DAILY AS NEEDED FOR DIZZINESS.  . mupirocin ointment (BACTROBAN) 2 % Place 1 application into the nose 2 (two) times daily.  . ondansetron (ZOFRAN ODT) 4 MG disintegrating tablet Take 1 tablet (4 mg total) by mouth every 8 (eight) hours as needed for nausea or vomiting.  [name] OVER THE COUNTER MEDICATION Takes fiber 1-2 tablespoons daily.  [name] oxybutynin (DITROPAN) 5 MG tablet Take 1 tablet (5 mg total) by mouth 2 (two) times daily.  [name] oxyCODONE (ROXICODONE) 5 MG immediate release tablet Take 1 tablet (5 mg total) by mouth every 6 (six) hours as needed for severe pain.  . polyethylene glycol (MIRALAX / GLYCOLAX) packet Take 17 g by mouth daily as needed for mild constipation.  . predniSONE (DELTASONE) 20 MG tablet 1 tab 3 x day for 3 days, then 1 tab 2 x day for 3 days, then 1 tab 1 x day for 5 days  . promethazine-dextromethorphan  (PROMETHAZINE-DM) 6.25-15 MG/5ML syrup Take 1 to 2 tsp every 4 hours if needed for cough  . rOPINIRole (REQUIP) 2 MG tablet TAKE 1 TABLET 4 TIMES A DAY FOR RESTLESS LEG SYNDROME  . telmisartan (MICARDIS) 20 MG tablet TAKE 1 TABLET BY MOUTH EVERY DAY  . TRAMADOL HCL ER PO Take by mouth.  . Vitamin D, Ergocalciferol, (DRISDOL) 1.25 MG (50000 UT) CAPS capsule TAKE 1 CAPSULE BY MOUTH DAILY.  [name] esomeprazole (NEXIUM) 40 MG capsule Take 1 capsule Daily for Acid indigestion & Reflux (Patient not taking: Reported on 07/07/2018)  . fluconazole (DIFLUCAN) 150 MG tablet Take 1 tablet (150 mg total) by mouth daily. (Patient not taking: Reported on 07/07/2018)   No current facility-administered  medications on file prior to visit.     ROS: all negative except above.   Physical Exam:  BP (!) 102/54   Pulse (!) 106   Temp (!) 97.5 F (36.4 C)   SpO2 99%   General Appearance: Well nourished, in no apparent distress. Eyes: PERRLA, conjunctiva no swelling or erythema ENT/Mouth: Hearing normal.  Neck: Supple Respiratory: Respiratory effort normal, BS equal bilaterally without rales, rhonchi, wheezing or stridor.  Cardio: RRR with no MRGs. 1+ peripheral pulses with edema RLE edema, R calf enlarge compared to L, superficial lumpy texture, some varicose veins, no redness or heat, generalized tenderness of calf, + deep pressure with Homan's Abdomen: Soft, + BS.  Non tender, no guarding, rebound, hernias, masses. Lymphatics: Non tender without lymphadenopathy.  Musculoskeletal: Full ROM, 5/5 strength, antalgic gait.  Skin: Warm, dry without rashes, lesions, ecchymosis to RLE Neuro: R side of face frozen (hx of Bell's palsy) Normal muscle tone, no cerebellar symptoms. Sensation intact.  Psych: Awake and oriented X 3, normal affect, Insight and Judgment appropriate.     Dan Maker, NP 11:20 AM [name]

## 2018-07-07 NOTE — Patient Instructions (Signed)

## 2018-07-08 ENCOUNTER — Encounter (HOSPITAL_COMMUNITY)
Admission: RE | Admit: 2018-07-08 | Discharge: 2018-07-08 | Disposition: A | Discharge status: Home / Self Care | Payer: Medicare Other | Source: Ambulatory Visit | Attending: Orthopedic Surgery | Admitting: Orthopedic Surgery

## 2018-07-08 ENCOUNTER — Encounter (HOSPITAL_COMMUNITY): Admission: RE | Admit: 2018-07-08 | Payer: Medicare Other | Source: Ambulatory Visit

## 2018-07-08 ENCOUNTER — Encounter (HOSPITAL_COMMUNITY): Payer: Self-pay

## 2018-07-08 DIAGNOSIS — T8484XA Pain due to internal orthopedic prosthetic devices, implants and grafts, initial encounter: Secondary | ICD-10-CM | POA: Insufficient documentation

## 2018-07-08 DIAGNOSIS — Z96651 Presence of right artificial knee joint: Secondary | ICD-10-CM | POA: Diagnosis present

## 2018-07-08 MED ORDER — TECHNETIUM TC 99M MEDRONATE IV KIT
19.0000 | PACK | Freq: Once | INTRAVENOUS | Status: AC | PRN
  Administered 2018-07-08: 19 via INTRAVENOUS

## 2018-07-16 ENCOUNTER — Encounter (HOSPITAL_COMMUNITY): Payer: Medicare Other

## 2018-07-16 ENCOUNTER — Other Ambulatory Visit (HOSPITAL_COMMUNITY): Payer: Medicare Other

## 2018-07-16 ENCOUNTER — Encounter: Payer: Self-pay | Admitting: Internal Medicine

## 2018-07-26 ENCOUNTER — Ambulatory Visit (INDEPENDENT_AMBULATORY_CARE_PROVIDER_SITE_OTHER)
Admission: RE | Admit: 2018-07-26 | Discharge: 2018-07-26 | Disposition: A | Discharge status: Home / Self Care | Payer: Medicare Other | Source: Ambulatory Visit | Attending: Pulmonary Disease | Admitting: Pulmonary Disease

## 2018-07-26 ENCOUNTER — Ambulatory Visit: Payer: Medicare Other | Admitting: Pulmonary Disease

## 2018-07-26 ENCOUNTER — Encounter: Payer: Self-pay | Admitting: Pulmonary Disease

## 2018-07-26 ENCOUNTER — Other Ambulatory Visit: Payer: Self-pay

## 2018-07-26 VITALS — BP 118/60 | HR 91 | Ht 64.0 in | Wt 183.0 lb

## 2018-07-26 DIAGNOSIS — J209 Acute bronchitis, unspecified: Secondary | ICD-10-CM | POA: Diagnosis not present

## 2018-07-26 DIAGNOSIS — R091 Pleurisy: Secondary | ICD-10-CM

## 2018-07-26 DIAGNOSIS — J479 Bronchiectasis, uncomplicated: Secondary | ICD-10-CM

## 2018-07-26 DIAGNOSIS — Z9989 Dependence on other enabling machines and devices: Secondary | ICD-10-CM

## 2018-07-26 DIAGNOSIS — G4733 Obstructive sleep apnea (adult) (pediatric): Secondary | ICD-10-CM | POA: Diagnosis not present

## 2018-07-26 MED ORDER — DOXYCYCLINE HYCLATE 100 MG PO TABS: 100.0000 mg | ORAL_TABLET | Freq: Two times a day (BID) | ORAL | 0 refills | Status: DC

## 2018-07-26 MED ORDER — GABAPENTIN 300 MG PO CAPS: ORAL_CAPSULE | ORAL | 0 refills | Status: DC

## 2018-07-26 NOTE — Patient Instructions (Addendum)
Doxycycline 100 mg pill twice daily for 7 days  Call by the end of the week if not feeling better  Follow up in 6 months

## 2018-07-26 NOTE — Progress Notes (Signed)
[facility]  Chief Complaint  Patient presents with  . Acute Visit    Pt is having lower left side pain, dry cough, wheezing and SOB in last week.    Constitutional:  BP 118/60 (BP Location: Left Arm, Cuff Size: Normal)   Pulse 91   Ht 5' 4"  (1.626 m)   Wt 183 lb (83 kg)   SpO2 93%   BMI 31.41 kg/m   Past Medical History:  Vit D deficiency, TIA, RLS, Hypothyroidism, HTN, HLD, GERD, Fibromyalgia, Depression, CAD, Crohn's disease  Brief Summary:  Crystal [name] is a 68 y.o. female with obstructive sleep apnea, and bronchiectasis in setting of reflux.  She had leg swelling and found to have baker cyst.  No clots.  She developed left sided chest pain a week ago.  Has been getting a cough with sputum and some wheeze.  Not having fever.  Pain was sharp.  Some better, but still there.  Hurts when she takes a breath.  CXR today shows bronchitic changes (reviewed by me)  Physical Exam:   Appearance - well kempt   ENMT - no sinus tenderness, no nasal discharge, no oral exudate, right facial droop, narrow nasal angles  Neck - no masses, trachea midline, no thyromegaly, no elevation in JVP  Respiratory - normal appearance of chest wall, normal respiratory effort w/o accessory muscle use, no dullness on percussion, no tactile fremitus, no wheezing or rales  CV - s1s2 regular rate and rhythm, no murmurs, no peripheral edema, radial pulses symmetric  GI - soft, non tender  Lymph - no adenopathy noted in neck and axillary areas  MSK - normal gait  Ext - no cyanosis, clubbing, or joint inflammation noted  Skin - no rashes, lesions, or ulcers  Neuro - normal strength, oriented x 3  Psych - normal mood and affect   Assessment/Plan:   Acute bronchitis. - will give course of doxycycline - if symptoms don't improve, then will need CT chest  Obstructive sleep apnea. - she reports compliance - continue auto CPAP  Nasal congestion  with narrow nasal angles and alar collapse. - f/u with Dr. Lucia Gaskins with ENT  Regional bronchiectasis in setting of reflux. - monitor clinically - she is followed by Dr. Earlean Shawl with GI for her reflux    Patient Instructions  Doxycycline 100 mg pill twice daily for 7 days  Call by the end of the week if not feeling better  Follow up in 6 months  Time spent 28 minutes with > 50% face time  [name], MD [place] Pager: [phone] 07/26/2018, 10:38 AM  Flow Sheet     Pulmonary tests:  PFT 11/21/15 >> FEV1 2.51 (110%), FEV1% 85%, TLC 4.36 (88%), DLCO 77% CT chest 10/01/17 >> 3 mm LLL nodule, mild cylindrical BTX Rt side  Sleep tests:  HST 12/04/15 >> 30.7, SpO2 low 73%  Cardiac tests:  Echo 05/16/13 >> EF 50 to 55%, mild MR  Medications:   Allergies as of 07/26/2018      Reactions   Atorvastatin Other (See Comments)   Extreme pain   Codeine Other (See Comments)   Headache   Pravastatin Other (See Comments)   Exreme pain   Statins Other (See Comments)   Extreme pain   Amitriptyline Other (See Comments)   Unspecified   Fish Oil Nausea Only   Linseed Oil Other (See Comments)   Other Reaction: Other reaction   Nuvigil [armodafinil] Other (See Comments)  Unspecified   Sulfa Antibiotics Other (See Comments)   Erythromycin Other (See Comments)   Reaction unspecified   Erythromycin Base Rash   Flax Seed [bio-flax] Rash   Linseed   Hydrocodone-acetaminophen Rash   Morphine Nausea And Vomiting, Rash   Nsaids Nausea Only   Sertraline Rash   Sinemet [carbidopa W-levodopa] Rash   Sulfamethoxazole Rash      Medication List       Accurate as of July 26, 2018 10:38 AM. Always use your most recent med list.        aspirin EC 81 MG tablet Take 81 mg by mouth daily.   bisoprolol-hydrochlorothiazide 10-6.25 MG tablet Commonly known as:  ZIAC Take 1 tablet daily for BP   budesonide 3 MG 24 hr capsule Commonly known as:  ENTOCORT  EC Take 3 mg by mouth daily.   buPROPion 300 MG 24 hr tablet Commonly known as:  WELLBUTRIN XL TAKE 1 TABLET BY MOUTH EVERY MORNING FOR MOOD AND FOCUS ATTENTION   cyclobenzaprine 5 MG tablet Commonly known as:  FLEXERIL TAKE 1 TABLET BY MOUTH 2 TIMES DAILY AS NEEDED FOR MUSCLE SPASMS.   diclofenac sodium 1 % Gel Commonly known as:  VOLTAREN Apply 2 g topically 2 (two) times daily as needed.   doxycycline 100 MG tablet Commonly known as:  VIBRA-TABS Take 1 tablet (100 mg total) by mouth 2 (two) times daily.   DULoxetine 60 MG capsule Commonly known as:  CYMBALTA TAKE 1 CAPSULE BY MOUTH 2 TIMES A DAY FOR PAIN.   fenofibrate micronized 134 MG capsule Commonly known as:  LOFIBRA TAKE 1 CAPSULE BY MOUTH DAILY BEFORE BREAKFAST   ferrous sulfate 325 (65 FE) MG tablet Take 325 mg by mouth daily.   fluconazole 150 MG tablet Commonly known as:  DIFLUCAN Take 1 tablet (150 mg total) by mouth daily.   fluticasone 50 MCG/ACT nasal spray Commonly known as:  FLONASE USE TWO SPRAYS IN EACH NOSTRIL DAILY AS NEEDED   gabapentin 300 MG capsule Commonly known as:  NEURONTIN TAKE ONE CAPSULE 3 TIMES A DAY   levothyroxine 100 MCG tablet Commonly known as:  SYNTHROID, LEVOTHROID Take 100 mcg by mouth daily before breakfast.   lidocaine 2 % jelly Commonly known as:  XYLOCAINE apply small amount topically to affected area 2-3 times a day   meclizine 25 MG tablet Commonly known as:  ANTIVERT TAKE 1 TABLET (25 MG TOTAL) BY MOUTH 3 (THREE) TIMES DAILY AS NEEDED FOR DIZZINESS.   mupirocin ointment 2 % Commonly known as:  BACTROBAN Place 1 application into the nose 2 (two) times daily.   nystatin 100000 UNIT/ML suspension Commonly known as:  MYCOSTATIN 5 ml four times a day, retain in mouth as long as possible (Swish and Spit).  Use for 48 hours after symptoms resolve.   ondansetron 4 MG disintegrating tablet Commonly known as:  ZOFRAN ODT Take 1 tablet (4 mg total) by mouth every 8  (eight) hours as needed for nausea or vomiting.   OVER THE COUNTER MEDICATION Takes fiber 1-2 tablespoons daily.   oxybutynin 5 MG tablet Commonly known as:  DITROPAN Take 1 tablet (5 mg total) by mouth 2 (two) times daily.   polyethylene glycol packet Commonly known as:  MIRALAX / GLYCOLAX Take 17 g by mouth daily as needed for mild constipation.   rOPINIRole 2 MG tablet Commonly known as:  REQUIP TAKE 1 TABLET 4 TIMES A DAY FOR RESTLESS LEG SYNDROME   telmisartan 20 MG tablet Commonly  known as:  MICARDIS TAKE 1 TABLET BY MOUTH EVERY DAY   TRAMADOL HCL ER PO Take by mouth.   Vitamin D (Ergocalciferol) 1.25 MG (50000 UT) Caps capsule Commonly known as:  DRISDOL TAKE 1 CAPSULE BY MOUTH DAILY.       Past Surgical History:  She  has a past surgical history that includes Shoulder adhesion release; Spine surgery; Appendectomy (1960); Cesarean section ([place]); Abdominal hysterectomy (1992); Rotator cuff repair (Left, 2005); Tonsillectomy (1960); Joint replacement; Toe Surgery; Toe Surgery; Anterior cervical decompression/discectomy fusion 4 level (N/A, 06/14/2015); ORIF femur fracture (Left, 03/24/2017); and Knee surgery.  Family History:  Her family history includes Breast cancer in her mother; Cancer in her mother; Depression in her sister; Diabetes in her mother; Heart disease in her father; Hyperlipidemia in her father; Hypertension in her father; Leukemia in her sister; Lupus in her sister; Migraines in her daughter; Neuropathy in her father; Osteoporosis in her mother; Rheum arthritis in her sister.  Social History:  She  reports that she has never smoked. She has never used smokeless tobacco. She reports current alcohol use. She reports that she does not use drugs.

## 2018-07-26 NOTE — Telephone Encounter (Signed)
Gabapentin refill request 

## 2018-07-29 ENCOUNTER — Other Ambulatory Visit: Payer: Self-pay | Admitting: Internal Medicine

## 2018-08-02 NOTE — Progress Notes (Signed)
3 Month Follow Up   Assessment and Plan:   Crystal [name] was seen today for follow-up.  Diagnoses and all orders for this visit:  Patient has recent labs in December, no labs today  Essential hypertension Experiencing hypotension Take half tablet of bisoprolol-HCTZ 10-6.25mg  and monitor blood pressure Sent in Rx of bisoprolol-HCTZ 5-6.25mg , one tablet daily in morning. Also taking Telmisartan 20mg  daily.  After two weeks of lower dose bisoprolol, consider taking half dose is still hypotensive. Discussed with patient at length making once change at a time and monitor.   Hypothyroidism, unspecified type  continue medications the same, reminded to take on an empty stomach 30-2360mins before food.   Type 2 diabetes mellitus with hyperlipidemia (HCC) Doing well on current regiment continue with benefit defer lipids today decrease fatty foods increase activity.   Gastroesophageal reflux disease, esophagitis presence not specified Has upcoming follow up with GI Reports dexilant is working well for her vs ranitidine. Continue current regiment with benefit.  Type 2 diabetes mellitus with stage 2 chronic kidney disease, without long-term current use of insulin (HCC) Doing well managing by monitoring dietary intake Increase fluids, avoid NSAIDS, monitor sugars, will monitor   Depression, major, recurrent, in partial remission (HCC) Doing well on current regiment, continue with benefit  Anemia due to vitamin B12 deficiency, unspecified B12 deficiency type Continue iron supplementation   Irritable bowel syndrome, Diverticulosis Doing well on current regiment, continue with benefit Taking budesonide 3mg  daily Uses miralax PRN Following with Dr Kinnie ScalesMedoff  Fibromyalgia Managing with Cymbalta for this Following with Dr Ollen BowlHarkins  Pleurisy Taking doxycycline Obstructive sleep apnea Has BiPap and does not use this as recommended and follows with Neurology for this, Dr Ladell HeadsSaima Discussed  contacting dentistry? Discussed hygiene of mask and tubing.  Oral candidiasis Has been using nystatin mouth wash for this with only minimal resolve Discussed at length finding underlying cause likely recent antibiotics exacerbating this from recent sinusitis and most recently pleurisy and Doxy course. -     fluconazole (DIFLUCAN) 150 MG tablet; Take one tablet by mouth daily x7days.  Discussed medications and side effects with patient.  Vitamin D deficiency Doing well on current regiment, continue with benefit Continue supplementation   Follow up in two weeks on blood pressure, bring log for evaluation.  Patient agrees to this plan.  Call or return with new or worsening symptoms as discussed in appointment.  May contact via office phone [phone] or MyChart.   Continue diet and meds as discussed. Further disposition pending results of labs. Discussed med's effects and SE's.   Over 30 minutes of exam, counseling, chart review, and critical decision making was performed.   Future Appointments  Date Time Provider Department Center  12/17/2018 11:15 AM Crystal [name], William, MD [facility] None    ----------------------------------------------------------------------------------------------------------------------  HPI 68 y.o. female  presents for 3 month follow up on HTN, HLD, Prediabetes, GERD, Hypothyroidism, Crohn's Dz, OSA/BiPAP, weight and vitamin D deficiency.  She has a complex medical history.    Reports she recently saw the pulmonolgist for pleurisy and was prescribed doxycycline.  She has completed the course but she is still having pain although it is improved.  Dr Craige CottaSood managing this.  She reports that since it is not improving she was wanting another x-ray today.  Reviewed not with patient and Dr Craige CottaSood mentioned CT if symptoms do not improve.  Recommend follow up to continue this plan of care.   She is following up with Ortho, Dr Carola FrostHandy, tomorrow.  For left  thigh pain.  She  reports that she has been having increasing pain with this and causing increased antalgic gait.  She also has history of DDD cervical and lumbar.  Chronic Pain Syndrome consequent of multiple neck & back surgeries.  She is taking Cymbalta/Gabapentin to avoid chronic opioids.   She has seen Ortho for her right knee pain.  She had replacement several years ago.  Xrays and bone scan completed.  Conclusion that revision of right knee is not warranted at this time and follow up as needed.  Late December of 2019 she had ED visit for Baker's cyst rupture on right popliteal.  DVT studies negative.  Recommend ACE wraps and elevate legs at discharge.  Reports this still bothers her but has improved.  She reports that in April Ortho is going to do her Right shoulder surgery.  She had some facial lesions that needed to be healed prior to surgery.  She has seen dermatology for this and they are improving.   She is still battling the thrush and reoccurrence of angular cheilitis.  This is likely from recent concurrent antibiotics related  Will do (Diflucan [phone])     Patient has hx/o OSA & does not use her CPAP/BiPAP. She follows with Neurology for this.  She also has history of Bell's Palsy with residual symptoms, left hemi-facial paresis.  Follows with Neurosurgery, Dr Venetia Maxon, for stenosis and history of DDD cervical and lumbar.  She received injections to help with this and the last was in August 2019.  Dr Lenise Herald[facility], for injections in her feet and doing well with this.   07/06/18 she had pre-op labs.  A1c 6.4%   BMI is Body mass index is 30.97 kg/m., she  has no been working on diet and exercise. Wt Readings from Last 3 Encounters:  08/03/18 180 lb 6.4 oz (81.8 kg)  07/26/18 183 lb (83 kg)  06/20/18 178 lb (80.7 kg)    HTN predates 2006. Her blood pressure has not been controlled at home, today their BP is BP: 98/60  She does not workout. She denies any cardiac symptoms, chest pains,  palpitations, shortness of breath, dizziness or lower extremity edema.     She is on cholesterol medication Fenofibrate and denies myalgias. Her cholesterol is at goal. The cholesterol last visit was:   Lab Results  Component Value Date   CHOL 170 02/10/2018   HDL 63 02/10/2018   LDLCALC 90 02/10/2018   TRIG 79 02/10/2018   CHOLHDL 2.7 02/10/2018    She has not been working on diet and exercise for prediabetes (A1c 6.7%, 2016), and denies hyperglycemia, hypoglycemia , polydipsia and polyuria. Last A1C in the office was:  Lab Results  Component Value Date   HGBA1C 6.2 (H) 02/10/2018   Patient is on Vitamin D supplement.  Deficiency (33 in 2008) Lab Results  Component Value Date   VD25OH 107 (H) 02/10/2018       Current Medications:  Current Outpatient Medications on File Prior to Visit  Medication Sig  . aspirin EC 81 MG tablet Take 81 mg by mouth daily.  . budesonide (ENTOCORT EC) 3 MG 24 hr capsule Take 3 mg by mouth daily.  [name] buPROPion (WELLBUTRIN XL) 300 MG 24 hr tablet TAKE 1 TABLET BY MOUTH EVERY MORNING FOR MOOD AND FOCUS ATTENTION  . Cyanocobalamin (B-12 SL) Place under the tongue daily.  . cyclobenzaprine (FLEXERIL) 5 MG tablet TAKE 1 TABLET BY MOUTH 2 TIMES DAILY AS NEEDED FOR MUSCLE  SPASMS. (Patient taking differently: TAKE 1 TABLET BY MOUTH 1 TIME DAILY AS NEEDED FOR MUSCLE SPASMS.)  . diclofenac sodium (VOLTAREN) 1 % GEL Apply 2 g topically 2 (two) times daily as needed.   [name]. DIGESTIVE ENZYMES PO Take by mouth 2 (two) times daily.  . DULoxetine (CYMBALTA) 60 MG capsule TAKE 1 CAPSULE BY MOUTH 2 TIMES A DAY FOR PAIN.  . fenofibrate micronized (LOFIBRA) 134 MG capsule TAKE 1 CAPSULE BY MOUTH DAILY BEFORE BREAKFAST  . ferrous sulfate 325 (65 FE) MG tablet Take 325 mg by mouth daily.   . fluticasone (FLONASE) 50 MCG/ACT nasal spray USE TWO SPRAYS IN EACH NOSTRIL DAILY AS NEEDED  . gabapentin (NEURONTIN) 300 MG capsule TAKE ONE CAPSULE 3 TIMES A DAY  . levothyroxine  (SYNTHROID, LEVOTHROID) 100 MCG tablet TAKE 1 TABLET BY MOUTH ON TUES, WED, THURS, SAT AND SUN. TAKE 1& 1/2 TABLET ON MON AND FRI  . lidocaine (XYLOCAINE) 2 % jelly apply small amount topically to affected area 2-3 times a day  . meclizine (ANTIVERT) 25 MG tablet TAKE 1 TABLET (25 MG TOTAL) BY MOUTH 3 (THREE) TIMES DAILY AS NEEDED FOR DIZZINESS.  . mupirocin ointment (BACTROBAN) 2 % Place 1 application into the nose 2 (two) times daily.  [name]. nystatin (MYCOSTATIN) 100000 UNIT/ML suspension 5 ml four times a day, retain in mouth as long as possible (Swish and Spit).  Use for 48 hours after symptoms resolve.  . ondansetron (ZOFRAN ODT) 4 MG disintegrating tablet Take 1 tablet (4 mg total) by mouth every 8 (eight) hours as needed for nausea or vomiting.  [name]. oxybutynin (DITROPAN) 5 MG tablet Take 1 tablet (5 mg total) by mouth 2 (two) times daily.  [name]. Phenylephrine-guaiFENesin (MUCUS RELIEF D PO) Take by mouth daily.  . polyethylene glycol (MIRALAX / GLYCOLAX) packet Take 17 g by mouth daily as needed for mild constipation.  . Probiotic Product (PROBIOTIC DAILY PO) Take by mouth daily.  [name]. rOPINIRole (REQUIP) 2 MG tablet TAKE 1 TABLET 4 TIMES A DAY FOR RESTLESS LEG SYNDROME  . telmisartan (MICARDIS) 20 MG tablet TAKE 1 TABLET BY MOUTH EVERY DAY  . TRAMADOL HCL ER PO Take by mouth.  . Vitamin D, Ergocalciferol, (DRISDOL) 1.25 MG (50000 UT) CAPS capsule TAKE 1 CAPSULE BY MOUTH DAILY.   No current facility-administered medications on file prior to visit.     Allergies:  Allergies  Allergen Reactions  . Atorvastatin Other (See Comments)    Extreme pain  . Codeine Other (See Comments)    Headache  . Pravastatin Other (See Comments)    Exreme pain  . Statins Other (See Comments)    Extreme pain  . Amitriptyline Other (See Comments)    Unspecified  . Fish Oil Nausea Only  . Linseed Oil Other (See Comments)    Other Reaction: Other reaction  . Nuvigil [Armodafinil] Other (See Comments)     Unspecified  . Sulfa Antibiotics Other (See Comments)  . Erythromycin Other (See Comments)    Reaction unspecified  . Erythromycin Base Rash  . Flax Seed [Bio-Flax] Rash    Linseed  . Hydrocodone-Acetaminophen Rash  . Morphine Nausea And Vomiting and Rash  . Nsaids Nausea Only  . Sertraline Rash  . Sinemet [Carbidopa W-Levodopa] Rash  . Sulfamethoxazole Rash     Medical History:  Past Medical History:  Diagnosis Date  . Arthritis   . ASCVD (arteriosclerotic cardiovascular disease)   . Closed nondisplaced subtrochanteric fracture of left femur (HCC) 09/03/2017  September 2018.  Status post surgery by Dr. Carola Frost  . Depression   . Fibromyalgia   . Gait disorder 10/26/2012  . GERD (gastroesophageal reflux disease)   . GI bleed   . Hyperlipidemia   . Hypertension   . Hypothyroid   . Periprosthetic supracondylar fracture of femur, left  03/23/2017  . Restless leg syndrome   . Rhinitis 06/25/2010  . Sleep apnea   . TIA (transient ischemic attack)    3        . Vitamin D deficiency     Family history- Reviewed and unchanged   Social history- Reviewed and unchanged   Names of Other Physician/Practitioners you currently use: 1.  [facility] here for primary care 2. Eye Exam Due for 2020 3. Dentist due for 2020  Patient Care Team: Crystal Cowboy, MD as PCP - General (Internal Medicine) Huston Foley, MD as Attending Physician (Neurology) Drema Halon, MD as Consulting Physician (Otolaryngology) Sharrell Ku, MD as Consulting Physician (Gastroenterology) Maeola Harman, MD as Consulting Physician (Neurosurgery) Antony Contras, MD as Consulting Physician (Ophthalmology) Campbell Stall, MD as Consulting Physician (Dermatology) Dannielle Huh, MD as Consulting Physician (Orthopedic Surgery) Kennith Maes, MD as Referring Physician (Orthopedic Surgery)   Screening Tests: Immunization History  Administered Date(s)  Administered  . DT 01/03/2015  . Influenza Split 04/16/2018  . Influenza Whole 03/30/2010  . Influenza, High Dose Seasonal PF 03/13/2015  . Influenza-Unspecified 03/31/2011, 05/14/2014, 02/27/2016  . Pneumococcal Conjugate-13 04/17/2015  . Pneumococcal Polysaccharide-23 01/06/2017  . Pneumococcal-Unspecified 06/30/2005  . Td 06/30/2004  . Zoster 07/01/2007     Vaccinations: TD or Tdap: 2016  Influenza: 2019 Pneumococcal: 2018 Prevnar13: 2016 Zostavax:  2009   Preventative Care: Last colonoscopy: 2019 Last mammogram: 2019 Last pap smear/pelvic exam:  Hysterectomy 1992 DEXA: 2019 Hep C screening 2014: Negative  Imaging: Chest X-ray: 06/2018 EKG: 06/2018 ECHO: 04/2013    Review of Systems:  Review of Systems  Constitutional: Negative for chills, diaphoresis, fever, malaise/fatigue and weight loss.  HENT: Negative for congestion, ear discharge, ear pain, hearing loss, nosebleeds, sinus pain, sore throat and tinnitus.   Eyes: Negative for blurred vision, double vision, photophobia, pain, discharge and redness.  Respiratory: Negative for cough, hemoptysis, sputum production, shortness of breath, wheezing and stridor.   Cardiovascular: Negative for chest pain, palpitations, orthopnea, claudication, leg swelling and PND.  Gastrointestinal: Positive for constipation. Negative for abdominal pain, blood in stool, diarrhea, heartburn, melena, nausea and vomiting.  Genitourinary: Negative for dysuria, flank pain, frequency, hematuria and urgency.  Musculoskeletal: Positive for joint pain and myalgias. Negative for back pain, falls and neck pain.       Right shoulder pain, pending surgery.  Skin: Negative for itching and rash.  Neurological: Negative for dizziness, tingling, tremors, sensory change, speech change, focal weakness, seizures, loss of consciousness, weakness and headaches.  Endo/Heme/Allergies: Negative for environmental allergies and polydipsia. Does not bruise/bleed  easily.  Psychiatric/Behavioral: Negative for depression, hallucinations, memory loss, substance abuse and suicidal ideas. The patient is not nervous/anxious and does not have insomnia.       Physical Exam: BP 98/60   Pulse 84   Temp 97.7 F (36.5 C)   Ht 5\' 4"  (1.626 m)   Wt 180 lb 6.4 oz (81.8 kg)   BMI 30.97 kg/m  Wt Readings from Last 3 Encounters:  08/03/18 180 lb 6.4 oz (81.8 kg)  07/26/18 183 lb (83 kg)  06/20/18 178 lb (80.7 kg)   General Appearance: Well nourished,  in no apparent distress. Eyes: PERRLA, EOMs, conjunctiva no swelling or erythema. Diplopia noted. Sinuses: No Frontal/maxillary tenderness ENT/Mouth: Ext aud canals clear, TMs without erythema, bulging. No erythema, swelling, or exudate on post pharynx.  Tonsils not swollen or erythematous. Hearing normal.  Neck: Supple, thyroid normal.  Respiratory: Respiratory effort normal, BS equal bilaterally without rales, rhonchi, wheezing or stridor.  Cardio: RRR with no MRGs. Brisk peripheral pulses without edema.  Abdomen: Soft, + BS.  Non tender, no guarding, rebound, hernias, masses. Lymphatics: Non tender without lymphadenopathy.  Musculoskeletal: Full ROM, 5/5 strength, RUE ROM not preformed related to pain,  antalgic gait Skin: Warm, dry without rashes, lesions, ecchymosis.  Neuro: Cranial nerves intact. No cerebellar symptoms.  Sensory deficit to lower extremities noted. Psych: Awake and oriented X 3, normal affect, Insight and Judgment appropriate.    Elder Negus, NP 9:27 AM [facility]

## 2018-08-03 ENCOUNTER — Ambulatory Visit: Payer: Self-pay | Admitting: Adult Health

## 2018-08-03 ENCOUNTER — Encounter: Payer: Self-pay | Admitting: Adult Health Nurse Practitioner

## 2018-08-03 ENCOUNTER — Ambulatory Visit: Payer: Medicare Other | Admitting: Adult Health Nurse Practitioner

## 2018-08-03 VITALS — BP 98/60 | HR 84 | Temp 97.7°F | Ht 64.0 in | Wt 180.4 lb

## 2018-08-03 DIAGNOSIS — I10 Essential (primary) hypertension: Secondary | ICD-10-CM

## 2018-08-03 DIAGNOSIS — R091 Pleurisy: Secondary | ICD-10-CM

## 2018-08-03 DIAGNOSIS — D519 Vitamin B12 deficiency anemia, unspecified: Secondary | ICD-10-CM

## 2018-08-03 DIAGNOSIS — K219 Gastro-esophageal reflux disease without esophagitis: Secondary | ICD-10-CM

## 2018-08-03 DIAGNOSIS — G4733 Obstructive sleep apnea (adult) (pediatric): Secondary | ICD-10-CM

## 2018-08-03 DIAGNOSIS — K5790 Diverticulosis of intestine, part unspecified, without perforation or abscess without bleeding: Secondary | ICD-10-CM

## 2018-08-03 DIAGNOSIS — E1122 Type 2 diabetes mellitus with diabetic chronic kidney disease: Secondary | ICD-10-CM

## 2018-08-03 DIAGNOSIS — M797 Fibromyalgia: Secondary | ICD-10-CM

## 2018-08-03 DIAGNOSIS — N182 Chronic kidney disease, stage 2 (mild): Secondary | ICD-10-CM

## 2018-08-03 DIAGNOSIS — E1169 Type 2 diabetes mellitus with other specified complication: Secondary | ICD-10-CM

## 2018-08-03 DIAGNOSIS — E559 Vitamin D deficiency, unspecified: Secondary | ICD-10-CM

## 2018-08-03 DIAGNOSIS — K589 Irritable bowel syndrome without diarrhea: Secondary | ICD-10-CM

## 2018-08-03 DIAGNOSIS — B370 Candidal stomatitis: Secondary | ICD-10-CM

## 2018-08-03 DIAGNOSIS — F3341 Major depressive disorder, recurrent, in partial remission: Secondary | ICD-10-CM

## 2018-08-03 DIAGNOSIS — Z79899 Other long term (current) drug therapy: Secondary | ICD-10-CM

## 2018-08-03 DIAGNOSIS — E039 Hypothyroidism, unspecified: Secondary | ICD-10-CM

## 2018-08-03 DIAGNOSIS — E785 Hyperlipidemia, unspecified: Secondary | ICD-10-CM

## 2018-08-03 NOTE — Patient Instructions (Addendum)
We are going to send in Diflucan to take once a day to help with the yeast.   Continue Vinegar and water if able to tolerate  Continue taking Ziac half tablet.  We will send in 69m/6.25 to take daily. Monitor your blood pressure twice a day and write it down on a log.  Once your mouth is healed we can consider changing the telmisartan.    Call or return with new or worsening symptoms as discussed in appointment.  May contact via office phone [phone] via [employer]       We Do NOT Approve of  [facility], [employer] Our Patients  To Do Home Visits & We Do NOT Approve of [employer] > > > > > > > > > > > > > > > > > > > > > > > > > > > > > > > > > > > > > > >  Preventive Care for Adults  A healthy lifestyle and preventive care can promote health and wellness. Preventive health guidelines for women include the following key practices.  A routine yearly physical is a good way to check with your health care provider about your health and preventive screening. It is a chance to share any concerns and updates on your health and to receive a thorough exam.  Visit your dentist for a routine exam and preventive care every 6 months. Brush your teeth twice a day and floss once a day. Good oral hygiene prevents tooth decay and gum disease.  The frequency of eye exams is based on your age, health, family medical history, use of contact lenses, and other factors. Follow your health care provider's recommendations for frequency of eye exams.  Eat a healthy diet. Foods like vegetables, fruits, whole grains, low-fat dairy products, and lean protein foods contain the nutrients you need without too many calories. Decrease your intake of foods high in solid fats, added sugars, and salt. Eat the right amount of calories for you. Get information about a proper diet from your health care provider, if necessary.  Regular physical exercise is one of the most important  things you can do for your health. Most adults should get at least 150 minutes of moderate-intensity exercise (any activity that increases your heart rate and causes you to sweat) each week. In addition, most adults need muscle-strengthening exercises on 2 or more days a week.  Maintain a healthy weight. The body mass index (BMI) is a screening tool to identify possible weight problems. It provides an estimate of body fat based on height and weight. Your health care provider can find your BMI and can help you achieve or maintain a healthy weight. For adults 20 years and older:  A BMI below 18.5 is considered underweight.  A BMI of 18.5 to 24.9 is normal.  A BMI of 25 to 29.9 is considered overweight.  A BMI of 30 and above is considered obese.  Maintain normal blood lipids and cholesterol levels by exercising and minimizing your intake of saturated fat. Eat a balanced diet with plenty of fruit and vegetables. If your lipid or cholesterol levels are high, you are over 50, or you are at high risk for heart disease, you may need your cholesterol levels checked more frequently. Ongoing high lipid and cholesterol levels should be treated with medicines if diet and exercise are not working.  If you smoke, find out from your health care provider how to quit. If you do  not use tobacco, do not start.  Lung cancer screening is recommended for adults aged 78-80 years who are at high risk for developing lung cancer because of a history of smoking. A yearly low-dose CT scan of the lungs is recommended for people who have at least a 30-pack-year history of smoking and are a current smoker or have quit within the past 15 years. A pack year of smoking is smoking an average of 1 pack of cigarettes a day for 1 year (for example: 1 pack a day for 30 years or 2 packs a day for 15 years). Yearly screening should continue until the smoker has stopped smoking for at least 15 years. Yearly screening should be stopped for  people who develop a health problem that would prevent them from having lung cancer treatment.  Avoid use of street drugs. Do not share needles with anyone. Ask for help if you need support or instructions about stopping the use of drugs.  High blood pressure causes heart disease and increases the risk of stroke.  Ongoing high blood pressure should be treated with medicines if weight loss and exercise do not work.  If you are 42-5 years old, ask your health care provider if you should take aspirin to prevent strokes.  Diabetes screening involves taking a blood sample to check your fasting blood sugar level. This should be done once every 3 years, after age 41, if you are within normal weight and without risk factors for diabetes. Testing should be considered at a younger age or be carried out more frequently if you are overweight and have at least 1 risk factor for diabetes.  Breast cancer screening is essential preventive care for women. You should practice "breast self-awareness." This means understanding the normal appearance and feel of your breasts and may include breast self-examination. Any changes detected, no matter how small, should be reported to a health care provider. Women in their 74s and 30s should have a clinical breast exam (CBE) by a health care provider as part of a regular health exam every 1 to 3 years. After age 104, women should have a CBE every year. Starting at age 79, women should consider having a mammogram (breast X-ray test) every year. Women who have a family history of breast cancer should talk to their health care provider about genetic screening. Women at a high risk of breast cancer should talk to their health care providers about having an MRI and a mammogram every year.  Breast cancer gene (BRCA)-related cancer risk assessment is recommended for women who have family members with BRCA-related cancers. BRCA-related cancers include breast, ovarian, tubal, and peritoneal  cancers. Having family members with these cancers may be associated with an increased risk for harmful changes (mutations) in the breast cancer genes BRCA1 and BRCA2. Results of the assessment will determine the need for genetic counseling and BRCA1 and BRCA2 testing.  Routine pelvic exams to screen for cancer are no longer recommended for nonpregnant women who are considered low risk for cancer of the pelvic organs (ovaries, uterus, and vagina) and who do not have symptoms. Ask your health care provider if a screening pelvic exam is right for you.  If you have had past treatment for cervical cancer or a condition that could lead to cancer, you need Pap tests and screening for cancer for at least 20 years after your treatment. If Pap tests have been discontinued, your risk factors (such as having a new sexual partner) need to be reassessed  to determine if screening should be resumed. Some women have medical problems that increase the chance of getting cervical cancer. In these cases, your health care provider may recommend more frequent screening and Pap tests.    Colorectal cancer can be detected and often prevented. Most routine colorectal cancer screening begins at the age of 50 years and continues through age 24 years. However, your health care provider may recommend screening at an earlier age if you have risk factors for colon cancer. On a yearly basis, your health care provider may provide home test kits to check for hidden blood in the stool. Use of a small camera at the end of a tube, to directly examine the colon (sigmoidoscopy or colonoscopy), can detect the earliest forms of colorectal cancer. Talk to your health care provider about this at age 84, when routine screening begins.  Direct exam of the colon should be repeated every 5-10 years through age 91 years, unless early forms of pre-cancerous polyps or small growths are found.  Osteoporosis is a disease in which the bones lose minerals and  strength with aging. This can result in serious bone fractures or breaks. The risk of osteoporosis can be identified using a bone density scan. Women ages 26 years and over and women at risk for fractures or osteoporosis should discuss screening with their health care providers. Ask your health care provider whether you should take a calcium supplement or vitamin D to reduce the rate of osteoporosis.  Menopause can be associated with physical symptoms and risks. Hormone replacement therapy is available to decrease symptoms and risks. You should talk to your health care provider about whether hormone replacement therapy is right for you.  Use sunscreen. Apply sunscreen liberally and repeatedly throughout the day. You should seek shade when your shadow is shorter than you. Protect yourself by wearing long sleeves, pants, a wide-brimmed hat, and sunglasses year round, whenever you are outdoors.  Once a month, do a whole body skin exam, using a mirror to look at the skin on your back. Tell your health care provider of new moles, moles that have irregular borders, moles that are larger than a pencil eraser, or moles that have changed in shape or color.  Stay current with required vaccines (immunizations).  Influenza vaccine. All adults should be immunized every year.  Tetanus, diphtheria, and acellular pertussis (Td, Tdap) vaccine. Pregnant women should receive 1 dose of Tdap vaccine during each pregnancy. The dose should be obtained regardless of the length of time since the last dose. Immunization is preferred during the 27th-36th week of gestation. An adult who has not previously received Tdap or who does not know her vaccine status should receive 1 dose of Tdap. This initial dose should be followed by tetanus and diphtheria toxoids (Td) booster doses every 10 years. Adults with an unknown or incomplete history of completing a 3-dose immunization series with Td-containing vaccines should begin or complete  a primary immunization series including a Tdap dose. Adults should receive a Td booster every 10 years.    Zoster vaccine. One dose is recommended for adults aged 76 years or older unless certain conditions are present.    Pneumococcal 13-valent conjugate (PCV13) vaccine. When indicated, a person who is uncertain of her immunization history and has no record of immunization should receive the PCV13 vaccine. An adult aged 56 years or older who has certain medical conditions and has not been previously immunized should receive 1 dose of PCV13 vaccine. This PCV13 should  be followed with a dose of pneumococcal polysaccharide (PPSV23) vaccine. The PPSV23 vaccine dose should be obtained at least 1 or more year(s) after the dose of PCV13 vaccine. An adult aged 7 years or older who has certain medical conditions and previously received 1 or more doses of PPSV23 vaccine should receive 1 dose of PCV13. The PCV13 vaccine dose should be obtained 1 or more years after the last PPSV23 vaccine dose.    Pneumococcal polysaccharide (PPSV23) vaccine. When PCV13 is also indicated, PCV13 should be obtained first. All adults aged 50 years and older should be immunized. An adult younger than age 75 years who has certain medical conditions should be immunized. Any person who resides in a nursing home or long-term care facility should be immunized. An adult smoker should be immunized. People with an immunocompromised condition and certain other conditions should receive both PCV13 and PPSV23 vaccines. People with human immunodeficiency virus (HIV) infection should be immunized as soon as possible after diagnosis. Immunization during chemotherapy or radiation therapy should be avoided. Routine use of PPSV23 vaccine is not recommended for American Indians, Mildred Natives, or people younger than 65 years unless there are medical conditions that require PPSV23 vaccine. When indicated, people who have unknown immunization and have  no record of immunization should receive PPSV23 vaccine. One-time revaccination 5 years after the first dose of PPSV23 is recommended for people aged 19-64 years who have chronic kidney failure, nephrotic syndrome, asplenia, or immunocompromised conditions. People who received 1-2 doses of PPSV23 before age 73 years should receive another dose of PPSV23 vaccine at age 31 years or later if at least 5 years have passed since the previous dose. Doses of PPSV23 are not needed for people immunized with PPSV23 at or after age 33 years.   Preventive Services / Frequency  Ages 25 years and over  Blood pressure check.  Lipid and cholesterol check.  Lung cancer screening. / Every year if you are aged 24-80 years and have a 30-pack-year history of smoking and currently smoke or have quit within the past 15 years. Yearly screening is stopped once you have quit smoking for at least 15 years or develop a health problem that would prevent you from having lung cancer treatment.  Clinical breast exam.** / Every year after age 23 years.   BRCA-related cancer risk assessment.** / For women who have family members with a BRCA-related cancer (breast, ovarian, tubal, or peritoneal cancers).  Mammogram.** / Every year beginning at age 44 years and continuing for as long as you are in good health. Consult with your health care provider.  Pap test.** / Every 3 years starting at age 63 years through age 104 or 13 years with 3 consecutive normal Pap tests. Testing can be stopped between 65 and 70 years with 3 consecutive normal Pap tests and no abnormal Pap or HPV tests in the past 10 years.  Fecal occult blood test (FOBT) of stool. / Every year beginning at age 89 years and continuing until age 19 years. You may not need to do this test if you get a colonoscopy every 10 years.  Flexible sigmoidoscopy or colonoscopy.** / Every 5 years for a flexible sigmoidoscopy or every 10 years for a colonoscopy beginning at age 77  years and continuing until age 67 years.  Hepatitis C blood test.** / For all people born from [birth year] through 1967 and any individual with known risks for hepatitis C.  Osteoporosis screening.** / A one-time screening for women ages 21  years and over and women at risk for fractures or osteoporosis.  Skin self-exam. / Monthly.  Influenza vaccine. / Every year.  Tetanus, diphtheria, and acellular pertussis (Tdap/Td) vaccine.** / 1 dose of Td every 10 years.  Zoster vaccine.** / 1 dose for adults aged 63 years or older.  Pneumococcal 13-valent conjugate (PCV13) vaccine.** / Consult your health care provider.  Pneumococcal polysaccharide (PPSV23) vaccine.** / 1 dose for all adults aged 70 years and older. Screening for abdominal aortic aneurysm (AAA)  by ultrasound is recommended for people who have history of high blood pressure or who are current or former smokers. ++++++++++++++++++++ Recommend Adult Low Dose Aspirin or  coated  Aspirin 81 mg daily  To reduce risk of Colon Cancer 20 %,  Skin Cancer 26 % ,  Melanoma 46%  and  Pancreatic cancer 60% ++++++++++++++++++++ Vitamin D goal  is between 70-100.  Please make sure that you are taking your Vitamin D as directed.  It is very important as a natural anti-inflammatory  helping hair, skin, and nails, as well as reducing stroke and heart attack risk.  It helps your bones and helps with mood. It also decreases numerous cancer risks so please take it as directed.  Low Vit D is associated with a 200-300% higher risk for CANCER  and 200-300% higher risk for HEART   ATTACK  &  STROKE.   .....................................[name] It is also associated with higher death rate at younger ages,  autoimmune diseases like Rheumatoid arthritis, Lupus, Multiple Sclerosis.    Also many other serious conditions, like depression, Alzheimer's Dementia, infertility, muscle aches, fatigue, fibromyalgia - just to name a few. ++++++++++++++++++ Recommend  the book "The END of DIETING" by Dr Excell Seltzer  & the book "The END of DIABETES " by Dr Excell Seltzer At [email or web address] - get book & Audio CD's    Being diabetic has a  300% increased risk for heart attack, stroke, cancer, and alzheimer- type vascular dementia. It is very important that you work harder with diet by avoiding all foods that are white. Avoid white rice (brown & wild rice is OK), white potatoes (sweetpotatoes in moderation is OK), White bread or wheat bread or anything made out of white flour like bagels, donuts, rolls, buns, biscuits, cakes, pastries, cookies, pizza crust, and pasta (made from white flour & egg whites) - vegetarian pasta or spinach or wheat pasta is OK. Multigrain breads like Arnold's or Pepperidge Farm, or multigrain sandwich thins or flatbreads.  Diet, exercise and weight loss can reverse and cure diabetes in the early stages.  Diet, exercise and weight loss is very important in the control and prevention of complications of diabetes which affects every system in your body, ie. Brain - dementia/stroke, eyes - glaucoma/blindness, heart - heart attack/heart failure, kidneys - dialysis, stomach - gastric paralysis, intestines - malabsorption, nerves - severe painful neuritis, circulation - gangrene & loss of a leg(s), and finally cancer and Alzheimers.    I recommend avoid fried & greasy foods,  sweets/candy, white rice (brown or wild rice or Quinoa is OK), white potatoes (sweet potatoes are OK) - anything made from white flour - bagels, doughnuts, rolls, buns, biscuits,white and wheat breads, pizza crust and traditional pasta made of white flour & egg white(vegetarian pasta or spinach or wheat pasta is OK).  Multi-grain bread is OK - like multi-grain flat bread or sandwich thins. Avoid alcohol in excess. Exercise is also important.    Eat all the vegetables you want -  avoid meat, especially red meat and dairy - especially cheese.  Cheese is the most concentrated form of trans-fats  which is the worst thing to clog up our arteries. Veggie cheese is OK which can be found in the fresh produce section at [employer] or [employer] or [employer]  +++++++++++++++++++ DASH Eating Plan  DASH stands for "Dietary Approaches to Stop Hypertension."   The DASH eating plan is a healthy eating plan that has been shown to reduce high blood pressure (hypertension). Additional health benefits may include reducing the risk of type 2 diabetes mellitus, heart disease, and stroke. The DASH eating plan may also help with weight loss. WHAT DO I NEED TO KNOW ABOUT THE DASH EATING PLAN? For the DASH eating plan, you will follow these general guidelines:  Choose foods with a percent daily value for sodium of less than 5% (as listed on the food label).  Use salt-free seasonings or herbs instead of table salt or sea salt.  Check with your health care provider or pharmacist before using salt substitutes.  Eat lower-sodium products, often labeled as "lower sodium" or "no salt added."  Eat fresh foods.  Eat more vegetables, fruits, and low-fat dairy products.  Choose whole grains. Look for the word "whole" as the first word in the ingredient list.  Choose fish   Limit sweets, desserts, sugars, and sugary drinks.  Choose heart-healthy fats.  Eat veggie cheese   Eat more home-cooked food and less restaurant, buffet, and fast food.  Limit fried foods.  Cook foods using methods other than frying.  Limit canned vegetables. If you do use them, rinse them well to decrease the sodium.  When eating at a restaurant, ask that your food be prepared with less salt, or no salt if possible.                      WHAT FOODS CAN I EAT? Read Dr Fara Olden Fuhrman's books on The End of Dieting & The End of Diabetes  Grains Whole grain or whole wheat bread. Brown rice. Whole grain or whole wheat pasta. Quinoa, bulgur, and whole grain cereals. Low-sodium cereals. Corn or whole wheat flour tortillas. Whole  grain cornbread. Whole grain crackers. Low-sodium crackers.  Vegetables Fresh or frozen vegetables (raw, steamed, roasted, or grilled). Low-sodium or reduced-sodium tomato and vegetable juices. Low-sodium or reduced-sodium tomato sauce and paste. Low-sodium or reduced-sodium canned vegetables.   Fruits All fresh, canned (in natural juice), or frozen fruits.  Protein Products  All fish and seafood.  Dried beans, peas, or lentils. Unsalted nuts and seeds. Unsalted canned beans.  Dairy Low-fat dairy products, such as skim or 1% milk, 2% or reduced-fat cheeses, low-fat ricotta or cottage cheese, or plain low-fat yogurt. Low-sodium or reduced-sodium cheeses.  Fats and Oils Tub margarines without trans fats. Light or reduced-fat mayonnaise and salad dressings (reduced sodium). Avocado. Safflower, olive, or canola oils. Natural peanut or almond butter.  Other Unsalted popcorn and pretzels. The items listed above may not be a complete list of recommended foods or beverages. Contact your dietitian for more options.  +++++++++++++++  WHAT FOODS ARE NOT RECOMMENDED? Grains/ White flour or wheat flour White bread. White pasta. White rice. Refined cornbread. Bagels and croissants. Crackers that contain trans fat.  Vegetables  Creamed or fried vegetables. Vegetables in a . Regular canned vegetables. Regular canned tomato sauce and paste. Regular tomato and vegetable juices.  Fruits Dried fruits. Canned fruit in light or heavy syrup. Fruit juice.  Meat and Other Protein Products Meat in general - RED meat & White meat.  Fatty cuts of meat. Ribs, chicken wings, all processed meats as bacon, sausage, bologna, salami, fatback, hot dogs, bratwurst and packaged luncheon meats.  Dairy Whole or 2% milk, cream, half-and-half, and cream cheese. Whole-fat or sweetened yogurt. Full-fat cheeses or blue cheese. Non-dairy creamers and whipped toppings. Processed cheese, cheese spreads, or cheese  curds.  Condiments Onion and garlic salt, seasoned salt, table salt, and sea salt. Canned and packaged gravies. Worcestershire sauce. Tartar sauce. Barbecue sauce. Teriyaki sauce. Soy sauce, including reduced sodium. Steak sauce. Fish sauce. Oyster sauce. Cocktail sauce. Horseradish. Ketchup and mustard. Meat flavorings and tenderizers. Bouillon cubes. Hot sauce. Tabasco sauce. Marinades. Taco seasonings. Relishes.  Fats and Oils Butter, stick margarine, lard, shortening and bacon fat. Coconut, palm kernel, or palm oils. Regular salad dressings.  Pickles and olives. Salted popcorn and pretzels.  The items listed above may not be a complete list of foods and beverages to avoid.

## 2018-08-04 MED ORDER — FLUCONAZOLE 150 MG PO TABS: ORAL_TABLET | ORAL | 1 refills | Status: DC

## 2018-08-08 ENCOUNTER — Encounter: Payer: Self-pay | Admitting: Adult Health Nurse Practitioner

## 2018-08-08 MED ORDER — BISOPROLOL-HYDROCHLOROTHIAZIDE 10-6.25 MG PO TABS: ORAL_TABLET | ORAL | 1 refills | Status: DC

## 2018-08-08 MED ORDER — BISOPROLOL-HYDROCHLOROTHIAZIDE 5-6.25 MG PO TABS: 1.0000 | ORAL_TABLET | Freq: Every day | ORAL | 1 refills | Status: DC

## 2018-08-14 ENCOUNTER — Other Ambulatory Visit: Payer: Self-pay | Admitting: Internal Medicine

## 2018-08-19 ENCOUNTER — Telehealth: Payer: Self-pay

## 2018-08-19 NOTE — Telephone Encounter (Signed)
Patient has a dental appointment and is requesting an antibiotic. Needs to take them 3 days prior to appointment. Dental office does not prescribe this.

## 2018-08-20 ENCOUNTER — Other Ambulatory Visit: Payer: Self-pay | Admitting: Adult Health Nurse Practitioner

## 2018-08-20 DIAGNOSIS — Z96659 Presence of unspecified artificial knee joint: Secondary | ICD-10-CM

## 2018-08-20 MED ORDER — CEPHALEXIN 500 MG PO CAPS: ORAL_CAPSULE | ORAL | 0 refills | Status: DC

## 2018-08-20 MED ORDER — FLUCONAZOLE 150 MG PO TABS: ORAL_TABLET | ORAL | 0 refills | Status: DC

## 2018-08-20 NOTE — Telephone Encounter (Signed)
Let patient know sent in Keflex 500mg , take 4 2 hours prior to dental procedure.

## 2018-08-24 ENCOUNTER — Ambulatory Visit: Payer: Medicare Other | Admitting: Adult Health Nurse Practitioner

## 2018-08-24 ENCOUNTER — Encounter: Payer: Self-pay | Admitting: Adult Health Nurse Practitioner

## 2018-08-24 VITALS — BP 120/72 | HR 99 | Temp 97.9°F | Ht 64.0 in | Wt 183.0 lb

## 2018-08-24 DIAGNOSIS — I10 Essential (primary) hypertension: Secondary | ICD-10-CM

## 2018-08-24 DIAGNOSIS — E039 Hypothyroidism, unspecified: Secondary | ICD-10-CM

## 2018-08-24 DIAGNOSIS — E1169 Type 2 diabetes mellitus with other specified complication: Secondary | ICD-10-CM | POA: Diagnosis not present

## 2018-08-24 DIAGNOSIS — E1122 Type 2 diabetes mellitus with diabetic chronic kidney disease: Secondary | ICD-10-CM | POA: Diagnosis not present

## 2018-08-24 DIAGNOSIS — N182 Chronic kidney disease, stage 2 (mild): Secondary | ICD-10-CM

## 2018-08-24 DIAGNOSIS — E785 Hyperlipidemia, unspecified: Secondary | ICD-10-CM

## 2018-08-24 DIAGNOSIS — B370 Candidal stomatitis: Secondary | ICD-10-CM

## 2018-08-24 NOTE — Progress Notes (Signed)
3 Month Follow Up   Assessment and Plan:   Nayima was seen today for follow-up on hypotension.  Diagnoses and all orders for this visit:  Patient has recent labs in December, no labs today  Essential hypertension  hypotension improved Change dosing  bisoprolol-HCTZ 5-6.25mg , one tablet by mouth BID STOP Telmisartan 20mg  daily, per patient request.  Contact office if blood pressure is 140/90 or higher consistently. Check blood pressure twice a day and record Follow up in two weeks  Norvasc 5mg  nightly  Hypothyroidism, unspecified type  continue medications the same, reminded to take on an empty stomach 30-61mins before food.   Type 2 diabetes mellitus with hyperlipidemia (HCC) Doing well on current regiment continue with benefit defer lipids today decrease fatty foods increase activity.   Gastroesophageal reflux disease, esophagitis presence not specified Has upcoming follow up with GI Reports dexilant is working well for her vs ranitidine. Continue current regiment with benefit.  Type 2 diabetes mellitus with stage 2 chronic kidney disease, without long-term current use of insulin (HCC) Doing well managing by monitoring dietary intake Increase fluids, avoid NSAIDS, monitor sugars, will monitor  Oral candidiasis Has been using nystatin mouth wash for this.  Resolves then returns shortly after. Discussed at length finding underlying cause likely recent antibiotics exacerbating this from recent sinusitis and most recently pleurisy and Doxy course. -     fluconazole (DIFLUCAN) 150 MG tablet; Take one tablet by mouth daily x7days.  Discussed medications and side effects with patient.  Follow up in two weeks on blood pressure, bring log for evaluation.  Patient agrees to this plan.  Call or return with new or worsening symptoms as discussed in appointment.  May contact via office phone [phone] or MyChart.   Continue diet and meds as discussed. Further  disposition pending results of labs. Discussed med's effects and SE's.   Over 30 minutes of exam, counseling, chart review, and critical decision making was performed.   Future Appointments  Date Time Provider Department Center  09/07/2018 11:30 AM Elder Negus, NP [facility] None  12/17/2018 11:15 AM [name], MD [facility] None    ----------------------------------------------------------------------------------------------------------------------  HPI 68 y.o. female  presents for 3 month follow up on hypotension.  Her HTN diagnosis predates 2006. Her blood pressure has increased to normal range compared to last visit.  Today her BP is 120/72.  She has been monitoring her blood pressure twice a day and brought her log with her today.  Her pressures are ranging from  103-140 over 70-90's.  She reports she is feeling much better since her pressure has elevated.  There has been an increase in her pulse ranging from 75 -120.  She denies any palpitations. She would like to discuss stopping the telmisartan.  Discussed taking Ziac 5-6.25mg  BID if she would like to stop taking the telmisartan.  She denies any cardiac symptoms, chest pains, palpitations, shortness of breath, dizziness or lower extremity edema.    She has history of HLD, Prediabetes, GERD, Hypothyroidism, Crohn's Dz, OSA/BiPAP, weight and vitamin D deficiency.  She has a complex medical history.    Recently saw Dr Craige Cotta, pulmonolgist for pleurisy and was prescribed doxycycline.  She has completed the course but she is still having pain although it is improved. She reports that since it is not improving she was wanting another x-ray today.  Reviewed not with patient and Dr Craige Cotta mentioned CT if symptoms do not improve.  Recommend follow up to continue this plan of care.  Has  not scheduled this yet.   She is still battling the thrush and reoccurrence of angular cheilitis.  This is likely from recent concurrent antibiotics related   Will do (Diflucan x7days)    Follows with Neurosurgery, Dr Venetia Maxon, for stenosis and history of DDD cervical and lumbar.  She received injections to help with this and the last was in August 2019.  She is schedule for an injection May of 2020.   BMI is Body mass index is 31.41 kg/m., she  has not been working on diet and exercise. Wt Readings from Last 3 Encounters:  08/24/18 183 lb (83 kg)  08/03/18 180 lb 6.4 oz (81.8 kg)  07/26/18 183 lb (83 kg)       She is on cholesterol medication Fenofibrate and denies myalgias. Her cholesterol is at goal. The cholesterol last visit was:   Lab Results  Component Value Date   CHOL 170 02/10/2018   HDL 63 02/10/2018   LDLCALC 90 02/10/2018   TRIG 79 02/10/2018   CHOLHDL 2.7 02/10/2018    She has not been working on diet and exercise for prediabetes (A1c 6.7%, 2016), and denies hyperglycemia, hypoglycemia , polydipsia and polyuria. Last A1C in the office was:  Lab Results  Component Value Date   HGBA1C 6.2 (H) 02/10/2018   Patient is on Vitamin D supplement.  Deficiency (33 in 2008) Lab Results  Component Value Date   VD25OH 107 (H) 02/10/2018       Current Medications:  Current Outpatient Medications on File Prior to Visit  Medication Sig  . aspirin EC 81 MG tablet Take 81 mg by mouth daily.  . bisoprolol-hydrochlorothiazide (ZIAC) 5-6.25 MG tablet Take 1 tablet by mouth daily.  . budesonide (ENTOCORT EC) 3 MG 24 hr capsule Take 3 mg by mouth daily.  [name] buPROPion (WELLBUTRIN XL) 300 MG 24 hr tablet TAKE 1 TABLET BY MOUTH EVERY MORNING FOR MOOD AND FOCUS ATTENTION  . cephALEXin (KEFLEX) 500 MG capsule Take 4 capsules by mouth, 2 hours prior to dental procedure.  . Cyanocobalamin (B-12 SL) Place under the tongue daily.  . cyclobenzaprine (FLEXERIL) 5 MG tablet Take 1/2 to 1 tablet 2 to 3 x /day ..... ONLY .[name]..  if needed for muscle spasm  . diclofenac sodium (VOLTAREN) 1 % GEL Apply 2 g topically 2 (two) times daily as needed.   [name]  DIGESTIVE ENZYMES PO Take by mouth 2 (two) times daily.  . DULoxetine (CYMBALTA) 60 MG capsule TAKE 1 CAPSULE BY MOUTH 2 TIMES A DAY FOR PAIN.  . fenofibrate micronized (LOFIBRA) 134 MG capsule TAKE 1 CAPSULE BY MOUTH DAILY BEFORE BREAKFAST  . ferrous sulfate 325 (65 FE) MG tablet Take 325 mg by mouth daily.   . fluconazole (DIFLUCAN) 150 MG tablet Take one tablet by mouth daily x7days.  . fluconazole (DIFLUCAN) 150 MG tablet Take one tablet at onset of symptoms and second tablet three days later for yeast.  . fluticasone (FLONASE) 50 MCG/ACT nasal spray USE TWO SPRAYS IN EACH NOSTRIL DAILY AS NEEDED  . gabapentin (NEURONTIN) 300 MG capsule TAKE ONE CAPSULE 3 TIMES A DAY  . levothyroxine (SYNTHROID, LEVOTHROID) 100 MCG tablet TAKE 1 TABLET BY MOUTH ON TUES, WED, THURS, SAT AND SUN. TAKE 1& 1/2 TABLET ON MON AND FRI  . lidocaine (XYLOCAINE) 2 % jelly apply small amount topically to affected area 2-3 times a day  . meclizine (ANTIVERT) 25 MG tablet TAKE 1 TABLET (25 MG TOTAL) BY MOUTH 3 (THREE) TIMES DAILY AS  NEEDED FOR DIZZINESS.  . mupirocin ointment (BACTROBAN) 2 % Place 1 application into the nose 2 (two) times daily.  [name] nystatin (MYCOSTATIN) 100000 UNIT/ML suspension 5 ml four times a day, retain in mouth as long as possible (Swish and Spit).  Use for 48 hours after symptoms resolve.  . ondansetron (ZOFRAN ODT) 4 MG disintegrating tablet Take 1 tablet (4 mg total) by mouth every 8 (eight) hours as needed for nausea or vomiting.  [name] oxybutynin (DITROPAN) 5 MG tablet Take 1 tablet (5 mg total) by mouth 2 (two) times daily.  [name] Phenylephrine-guaiFENesin (MUCUS RELIEF D PO) Take by mouth daily.  . polyethylene glycol (MIRALAX / GLYCOLAX) packet Take 17 g by mouth daily as needed for mild constipation.  . Probiotic Product (PROBIOTIC DAILY PO) Take by mouth daily.  [name] rOPINIRole (REQUIP) 2 MG tablet TAKE 1 TABLET 4 TIMES A DAY FOR RESTLESS LEG SYNDROME  . telmisartan (MICARDIS) 20 MG tablet TAKE 1  TABLET BY MOUTH EVERY DAY  . TRAMADOL HCL ER PO Take by mouth.  . Vitamin D, Ergocalciferol, (DRISDOL) 1.25 MG (50000 UT) CAPS capsule TAKE 1 CAPSULE BY MOUTH DAILY.  [name] Wheat Dextrin (BENEFIBER PO) Take by mouth.   No current facility-administered medications on file prior to visit.     Allergies:  Allergies  Allergen Reactions  . Atorvastatin Other (See Comments)    Extreme pain  . Codeine Other (See Comments)    Headache  . Pravastatin Other (See Comments)    Exreme pain  . Statins Other (See Comments)    Extreme pain  . Amitriptyline Other (See Comments)    Unspecified  . Fish Oil Nausea Only  . Linseed Oil Other (See Comments)    Other Reaction: Other reaction  . Nuvigil [Armodafinil] Other (See Comments)    Unspecified  . Sulfa Antibiotics Other (See Comments)  . Erythromycin Other (See Comments)    Reaction unspecified  . Erythromycin Base Rash  . Flax Seed [Bio-Flax] Rash    Linseed  . Hydrocodone-Acetaminophen Rash  . Morphine Nausea And Vomiting and Rash  . Nsaids Nausea Only  . Sertraline Rash  . Sinemet [Carbidopa W-Levodopa] Rash  . Sulfamethoxazole Rash     Medical History:  Past Medical History:  Diagnosis Date  . Arthritis   . ASCVD (arteriosclerotic cardiovascular disease)   . Closed nondisplaced subtrochanteric fracture of left femur (HCC) 09/03/2017   September 2018.  Status post surgery by Dr. Carola Frost  . Depression   . Fibromyalgia   . Gait disorder 10/26/2012  . GERD (gastroesophageal reflux disease)   . GI bleed   . Hyperlipidemia   . Hypertension   . Hypothyroid   . Periprosthetic supracondylar fracture of femur, left  03/23/2017  . Restless leg syndrome   . Rhinitis 06/25/2010  . Sleep apnea   . TIA (transient ischemic attack)    3        . Vitamin D deficiency     Family history- Reviewed and unchanged   Social history- Reviewed and unchanged   Names of Other Physician/Practitioners you currently use: 1. [facility] here for primary care 2. Eye Exam Due for 2020 3. Dentist due for 2020  Patient Care Team: [name], MD as PCP - General (Internal Medicine) Huston Foley, MD as Attending Physician (Neurology) Drema Halon, MD as Consulting Physician (Otolaryngology) Sharrell Ku, MD as Consulting Physician (Gastroenterology) Maeola Harman, MD as Consulting Physician (Neurosurgery) Antony Contras, MD as Consulting  Physician (Ophthalmology) Campbell Stall, MD as Consulting Physician (Dermatology) Dannielle Huh, MD as Consulting Physician (Orthopedic Surgery) Kennith Maes, MD as Referring Physician (Orthopedic Surgery)   Screening Tests: Immunization History  Administered Date(s) Administered  . DT 01/03/2015  . Influenza Split 04/16/2018  . Influenza Whole 03/30/2010  . Influenza, High Dose Seasonal PF 03/13/2015  . Influenza-Unspecified 03/31/2011, 05/14/2014, 02/27/2016  . Pneumococcal Conjugate-13 04/17/2015  . Pneumococcal Polysaccharide-23 01/06/2017  . Pneumococcal-Unspecified 06/30/2005  . Td 06/30/2004  . Zoster 07/01/2007     Vaccinations: TD or Tdap: 2016  Influenza: 2019 Pneumococcal: 2018 Prevnar13: 2016 Zostavax:  2009   Preventative Care: Last colonoscopy: 2019 Last mammogram: 2019 Last pap smear/pelvic exam:  Hysterectomy 1992 DEXA: 2019 Hep C screening 2014: Negative  Imaging: Chest X-ray: 06/2018 EKG: 06/2018 ECHO: 04/2013    Review of Systems:  Review of Systems  Constitutional: Negative for chills, diaphoresis, fever, malaise/fatigue and weight loss.  HENT: Negative for congestion, ear discharge, ear pain, hearing loss, nosebleeds, sinus pain, sore throat and tinnitus.   Eyes: Negative for blurred vision, double vision, photophobia, pain, discharge and redness.  Respiratory: Negative for cough, hemoptysis, sputum production, shortness of breath, wheezing and stridor.   Cardiovascular: Negative for chest  pain, palpitations, orthopnea, claudication, leg swelling and PND.  Gastrointestinal: Positive for constipation. Negative for abdominal pain, blood in stool, diarrhea, heartburn, melena, nausea and vomiting.  Genitourinary: Negative for dysuria, flank pain, frequency, hematuria and urgency.  Musculoskeletal: Positive for joint pain and myalgias. Negative for back pain, falls and neck pain.       Right shoulder pain, pending surgery.  Skin: Negative for itching and rash.  Neurological: Negative for dizziness, tingling, tremors, sensory change, speech change, focal weakness, seizures, loss of consciousness, weakness and headaches.  Endo/Heme/Allergies: Negative for environmental allergies and polydipsia. Does not bruise/bleed easily.  Psychiatric/Behavioral: Negative for depression, hallucinations, memory loss, substance abuse and suicidal ideas. The patient is not nervous/anxious and does not have insomnia.       Physical Exam: BP 120/72   Pulse 99   Temp 97.9 F (36.6 C)   Ht  (1.626 m)   Wt 183 lb (83 kg)   SpO2 93%   BMI 31.41 kg/m  Wt Readings from Last 3 Encounters:  08/24/18 183 lb (83 kg)  08/03/18 180 lb 6.4 oz (81.8 kg)  07/26/18 183 lb (83 kg)   General Appearance: Well nourished, in no apparent distress. Eyes: PERRLA, EOMs, conjunctiva no swelling or erythema. Diplopia noted. Sinuses: No Frontal/maxillary tenderness ENT/Mouth: Ext aud canals clear, TMs without erythema, bulging. No erythema, swelling, or exudate on post pharynx.  Tonsils not swollen or erythematous. Hearing normal.  Neck: Supple, thyroid normal.  Respiratory: Respiratory effort normal, BS equal bilaterally without rales, rhonchi, wheezing or stridor.  Cardio: RRR with no MRGs. Brisk peripheral pulses without edema.  Abdomen: Soft, + BS.  Non tender, no guarding, rebound, hernias, masses. Lymphatics: Non tender without lymphadenopathy.  Musculoskeletal: Full ROM, 5/5 strength,  antalgic gait Skin:  Warm, dry without rashes, lesions, ecchymosis.  Neuro: Cranial nerves intact. No cerebellar symptoms.  Sensory deficit to lower extremities noted. Psych: Awake and oriented X 3, normal affect, Insight and Judgment appropriate.    Elder Negus, NP 5:24 PM [facility]

## 2018-08-24 NOTE — Patient Instructions (Addendum)
Blood pressure goal 140/90 or less  Take bisoprolol-HCTZ 5-6.25mg  twice a day  STOP taking Telmisartan 20mg .  Monitor blood pressure twice a day  Follow up in two weeks  Contact office if you have high blood pressures consistently above goal.

## 2018-08-27 ENCOUNTER — Encounter: Payer: Self-pay | Admitting: Adult Health Nurse Practitioner

## 2018-08-28 ENCOUNTER — Other Ambulatory Visit: Payer: Self-pay | Admitting: Internal Medicine

## 2018-09-06 NOTE — Progress Notes (Signed)
3 Month Follow Up   Assessment and Plan:   Crystal [name] was seen today for follow-up on hypotension.  Diagnoses and all orders for this visit:  Essential hypertension  hypotension improved Continue:  bisoprolol-HCTZ 5-6.25mg , one tablet by mouth BID  Contact office if blood pressure is 140/90 or higher consistently. Goal is 130/80 or less. Check blood pressure twice a day and record Continue low sodium diet   Bilateral serous otitis media, unspecified chronicity  Take antihistamine, Zyrtec nightly Increase water intake  Rotator cuff arthropathy, right Managing at this time, surgery scheduled for May.  Oral candidiasis Improved at this time Continue to monitor   Call or return with new or worsening symptoms as discussed in appointment.  May contact via office phone [phone] or MyChart.   Continue diet and meds as discussed. Further disposition pending results of labs. Discussed med's effects and SE's.    [name], counseling, chart review, and critical decision making was performed.   Future Appointments  Date Time Provider Department Center  12/17/2018 11:15 AM [name], MD [facility] None    ----------------------------------------------------------------------------------------------------------------------  HPI 68 y.o. female  presents for 2 week follow up on hypotension.  Her HTN diagnosis predates 2006. Her blood pressure has increased to normal range compared to last visit.  Today her BP is 122/70 and pulse in the 70's.    She has been monitoring her blood pressure twice a day and brought her log with her today for review.  Her pressures are ranging from 115-120's over 70's- 80's  She reports she is feeling much better and not so drained. Her pulse readings range from 70's-80's with two outlier in the 90's.  She denies any cardiac symptoms, chest pains, palpitations, shortness of breath, dizziness or lower extremity edema.    She has  history of HLD, Prediabetes, GERD, Hypothyroidism, Crohn's Dz, OSA/BiPAP, weight and vitamin D deficiency.  She has a complex medical history.      Following with Dr Craige Cotta, pulmonolgist for pleurisy and was prescribed doxycycline which she has completed.  She is still having some pain with deep breath although it is improved. Continue to recommend follow up to continue this plan of care.  Has not scheduled this yet.   Dr Ollen Bowl gave spinal injection udocaine, kenalog, bupivacaine this morning.  She has mild improvement and thinks she will get more relief in next few days.  BMI is Body mass index is 30.73 kg/m., she  has not been working on diet and exercise. Wt Readings from Last 3 Encounters:  09/07/18 179 lb (81.2 kg)  08/24/18 183 lb (83 kg)  08/03/18 180 lb 6.4 oz (81.8 kg)     She has not been working on diet and exercise for prediabetes (A1c 6.7%, 2016), and denies hyperglycemia, hypoglycemia , polydipsia and polyuria. Last A1C in the office was:  Lab Results  Component Value Date   HGBA1C 6.2 (H) 02/10/2018       Current Medications:  Current Outpatient Medications on File Prior to Visit  Medication Sig  . aspirin EC 81 MG tablet Take 81 mg by mouth daily.  . budesonide (ENTOCORT EC) 3 MG 24 hr capsule Take 3 mg by mouth daily.  [name] buPROPion (WELLBUTRIN XL) 300 MG 24 hr tablet TAKE 1 TABLET BY MOUTH EVERY MORNING FOR MOOD AND FOCUS ATTENTION  . cephALEXin (KEFLEX) 500 MG capsule Take 4 capsules by mouth, 2 hours prior to dental procedure.  . Cyanocobalamin (B-12 SL) Place under the  tongue daily.  . cyclobenzaprine (FLEXERIL) 5 MG tablet Take 1/2 to 1 tablet 2 to 3 x /day ..... ONLY .[name]..  if needed for muscle spasm  . diclofenac sodium (VOLTAREN) 1 % GEL Apply 2 g topically 2 (two) times daily as needed.   [name] DIGESTIVE ENZYMES PO Take by mouth 2 (two) times daily.  . DULoxetine (CYMBALTA) 60 MG capsule TAKE 1 CAPSULE BY MOUTH 2 TIMES A DAY FOR PAIN.  . fenofibrate micronized  (LOFIBRA) 134 MG capsule TAKE 1 CAPSULE BY MOUTH DAILY BEFORE BREAKFAST  . ferrous sulfate 325 (65 FE) MG tablet Take 325 mg by mouth daily.   . fluticasone (FLONASE) 50 MCG/ACT nasal spray USE TWO SPRAYS IN EACH NOSTRIL DAILY AS NEEDED  . gabapentin (NEURONTIN) 300 MG capsule TAKE ONE CAPSULE 3 TIMES A DAY  . levothyroxine (SYNTHROID, LEVOTHROID) 100 MCG tablet TAKE 1 TABLET BY MOUTH ON TUES, WED, THURS, SAT AND SUN. TAKE 1& 1/2 TABLET ON MON AND FRI  . lidocaine (XYLOCAINE) 2 % jelly apply small amount topically to affected area 2-3 times a day  . meclizine (ANTIVERT) 25 MG tablet TAKE 1 TABLET (25 MG TOTAL) BY MOUTH 3 (THREE) TIMES DAILY AS NEEDED FOR DIZZINESS.  . mupirocin ointment (BACTROBAN) 2 % Place 1 application into the nose 2 (two) times daily.  [name] nystatin (MYCOSTATIN) 100000 UNIT/ML suspension 5 ml four times a day, retain in mouth as long as possible (Swish and Spit).  Use for 48 hours after symptoms resolve.  . ondansetron (ZOFRAN ODT) 4 MG disintegrating tablet Take 1 tablet (4 mg total) by mouth every 8 (eight) hours as needed for nausea or vomiting.  [name] oxybutynin (DITROPAN) 5 MG tablet Take 1 tablet (5 mg total) by mouth 2 (two) times daily.  [name] Phenylephrine-guaiFENesin (MUCUS RELIEF D PO) Take by mouth daily.  . polyethylene glycol (MIRALAX / GLYCOLAX) packet Take 17 g by mouth daily as needed for mild constipation.  . Probiotic Product (PROBIOTIC DAILY PO) Take by mouth daily.  [name] rOPINIRole (REQUIP) 2 MG tablet TAKE 1 TABLET 4 TIMES A DAY FOR RESTLESS LEG SYNDROME  . TRAMADOL HCL ER PO Take by mouth.  . Vitamin D, Ergocalciferol, (DRISDOL) 1.25 MG (50000 UT) CAPS capsule TAKE 1 CAPSULE BY MOUTH DAILY.  [name] Wheat Dextrin (BENEFIBER PO) Take by mouth.   No current facility-administered medications on file prior to visit.     Allergies:  Allergies  Allergen Reactions  . Atorvastatin Other (See Comments)    Extreme pain  . Codeine Other (See Comments)    Headache  .  Pravastatin Other (See Comments)    Exreme pain  . Statins Other (See Comments)    Extreme pain  . Amitriptyline Other (See Comments)    Unspecified  . Fish Oil Nausea Only  . Linseed Oil Other (See Comments)    Other Reaction: Other reaction  . Nuvigil [Armodafinil] Other (See Comments)    Unspecified  . Sulfa Antibiotics Other (See Comments)  . Erythromycin Other (See Comments)    Reaction unspecified  . Erythromycin Base Rash  . Flax Seed [Bio-Flax] Rash    Linseed  . Hydrocodone-Acetaminophen Rash  . Morphine Nausea And Vomiting and Rash  . Nsaids Nausea Only  . Sertraline Rash  . Sinemet [Carbidopa W-Levodopa] Rash  . Sulfamethoxazole Rash     Medical History:  Past Medical History:  Diagnosis Date  . Arthritis   . ASCVD (arteriosclerotic cardiovascular disease)   . Closed nondisplaced subtrochanteric fracture of  left femur [facility]) 09/03/2017   September 2018.  Status post surgery by Dr. Carola Frost  . Depression   . Fibromyalgia   . Gait disorder 10/26/2012  . GERD (gastroesophageal reflux disease)   . GI bleed   . Hyperlipidemia   . Hypertension   . Hypothyroid   . Periprosthetic supracondylar fracture of femur, left  03/23/2017  . Restless leg syndrome   . Rhinitis 06/25/2010  . Sleep apnea   . TIA (transient ischemic attack)    3        . Vitamin D deficiency     Family history- Reviewed and unchanged   Social history- Reviewed and unchanged   Names of Other Physician/Practitioners you currently use: 1. [facility] here for primary care 2. Eye Exam Due for 2020 3. Dentist due for 2020  Patient Care Team: [name], MD as PCP - General (Internal Medicine) Huston Foley, MD as Attending Physician (Neurology) Drema Halon, MD as Consulting Physician (Otolaryngology) Sharrell Ku, MD as Consulting Physician (Gastroenterology) Maeola Harman, MD as Consulting Physician (Neurosurgery) Antony Contras, MD as  Consulting Physician (Ophthalmology) Campbell Stall, MD as Consulting Physician (Dermatology) Dannielle Huh, MD as Consulting Physician (Orthopedic Surgery) Kennith Maes, MD as Referring Physician (Orthopedic Surgery)   Screening Tests: Immunization History  Administered Date(s) Administered  . DT 01/03/2015  . Influenza Split 04/16/2018  . Influenza Whole 03/30/2010  . Influenza, High Dose Seasonal PF 03/13/2015  . Influenza-Unspecified 03/31/2011, 05/14/2014, 02/27/2016  . Pneumococcal Conjugate-13 04/17/2015  . Pneumococcal Polysaccharide-23 01/06/2017  . Pneumococcal-Unspecified 06/30/2005  . Td 06/30/2004  . Zoster 07/01/2007     Vaccinations: TD or Tdap: 2016  Influenza: 2019 Pneumococcal: 2018 Prevnar13: 2016 Zostavax:  2009   Preventative Care: Last colonoscopy: 2019 Last mammogram: 2019 Last pap smear/pelvic exam:  Hysterectomy 1992 DEXA: 2019 Hep C screening 2014: Negative  Imaging: Chest X-ray: 06/2018 EKG: 06/2018 ECHO: 04/2013    Review of Systems:  Review of Systems  Constitutional: Negative for chills, diaphoresis, fever, malaise/fatigue and weight loss.  HENT: Negative for congestion, ear discharge, ear pain, hearing loss, nosebleeds, sinus pain, sore throat and tinnitus.   Eyes: Negative for blurred vision, double vision, photophobia, pain, discharge and redness.  Respiratory: Negative for cough, hemoptysis, sputum production, shortness of breath, wheezing and stridor.   Cardiovascular: Negative for chest pain, palpitations, orthopnea, claudication, leg swelling and PND.  Gastrointestinal: Positive for constipation. Negative for abdominal pain, blood in stool, diarrhea, heartburn, melena, nausea and vomiting.  Genitourinary: Negative for dysuria, flank pain, frequency, hematuria and urgency.  Musculoskeletal: Positive for joint pain and myalgias. Negative for back pain, falls and neck pain.       Right shoulder pain, pending surgery.  Skin:  Negative for itching and rash.  Neurological: Negative for dizziness, tingling, tremors, sensory change, speech change, focal weakness, seizures, loss of consciousness, weakness and headaches.  Endo/Heme/Allergies: Negative for environmental allergies and polydipsia. Does not bruise/bleed easily.  Psychiatric/Behavioral: Negative for depression, hallucinations, memory loss, substance abuse and suicidal ideas. The patient is not nervous/anxious and does not have insomnia.       Physical Exam: BP 122/70   Pulse 70   Temp (!) 97.3 F (36.3 C)   Ht 5\' 4"  (1.626 m)   Wt 179 lb (81.2 kg)   SpO2 92%   BMI 30.73 kg/m  Wt Readings from Last 3 Encounters:  09/07/18 179 lb (81.2 kg)  08/24/18 183 lb (83 kg)  08/03/18 180 lb 6.4  oz (81.8 kg)   General Appearance: Well nourished, in no apparent distress. ENT/Mouth: Ext aud canals clear, TMs without erythema, bulging. No erythema, swelling, or exudate on post pharynx.  Tonsils not swollen or erythematous. Hearing normal.  Neck: Supple, thyroid normal.  Respiratory: Respiratory effort normal, BS equal bilaterally without rales, rhonchi, wheezing or stridor.  Cardio: RRR with no MRGs. Brisk peripheral pulses without edema.    Elder Negus, NP 1:59 PM [facility]

## 2018-09-07 ENCOUNTER — Ambulatory Visit: Payer: Medicare Other | Admitting: Adult Health Nurse Practitioner

## 2018-09-07 ENCOUNTER — Encounter: Payer: Self-pay | Admitting: Adult Health Nurse Practitioner

## 2018-09-07 VITALS — BP 122/70 | HR 70 | Temp 97.3°F | Ht 64.0 in | Wt 179.0 lb

## 2018-09-07 DIAGNOSIS — H6593 Unspecified nonsuppurative otitis media, bilateral: Secondary | ICD-10-CM | POA: Diagnosis not present

## 2018-09-07 DIAGNOSIS — M12811 Other specific arthropathies, not elsewhere classified, right shoulder: Secondary | ICD-10-CM | POA: Diagnosis not present

## 2018-09-07 DIAGNOSIS — I10 Essential (primary) hypertension: Secondary | ICD-10-CM | POA: Diagnosis not present

## 2018-09-07 DIAGNOSIS — B370 Candidal stomatitis: Secondary | ICD-10-CM

## 2018-09-07 MED ORDER — NEOMYCIN-POLYMYXIN-HC 1 % OT SOLN: 3.0000 [drp] | Freq: Four times a day (QID) | OTIC | 0 refills | Status: DC

## 2018-09-07 MED ORDER — BISOPROLOL-HYDROCHLOROTHIAZIDE 5-6.25 MG PO TABS: 1.0000 | ORAL_TABLET | Freq: Every day | ORAL | 2 refills | Status: DC

## 2018-09-07 NOTE — Patient Instructions (Addendum)
   Treat the symptoms!  Please take these medications:     Allergy Symptoms / Runny Nose / Fullness in ears: Pick one  Zyrtec / Cetirizine Take 10mg  by mouth May cause drowsiness, take nightly Be sure to drink plenty of water If this is not effective, try Xyzal or Allegra  OR  Xyzal / Levocetirazine  Take 5mg  by mouth May cause drowsiness, take nightly Be sure to drink plenty of water If this is not effective try Allegra or Zyrtec  OR  Allegra / fexofenadine Take 180mg  by mouth daily If this is not effective try Zyrtec or Xyzal    Nasal Decongestant: Continue Mucinex D  This has an expectorant, nasal decongestant and cough suppressant. Take one tablet by mouth every 12hours.  Can increase to 2 tablets every 12 hours.  No more than 4 tablets in 24hours.    Continue Flonase: One spray in each nostril daily  This will help to open your nasal passages   May also use saline nasal spray.

## 2018-09-08 ENCOUNTER — Other Ambulatory Visit: Payer: Self-pay | Admitting: Internal Medicine

## 2018-09-10 ENCOUNTER — Other Ambulatory Visit: Payer: Self-pay

## 2018-09-10 DIAGNOSIS — I10 Essential (primary) hypertension: Secondary | ICD-10-CM

## 2018-09-10 MED ORDER — BISOPROLOL-HYDROCHLOROTHIAZIDE 5-6.25 MG PO TABS: 1.0000 | ORAL_TABLET | Freq: Two times a day (BID) | ORAL | 2 refills | Status: DC

## 2018-09-10 NOTE — Progress Notes (Signed)
New rx sent in for Bisoprolol-HCTZ 5-6.25, one tablet twice a day instead of once a day per provider.

## 2018-10-02 ENCOUNTER — Other Ambulatory Visit: Payer: Self-pay | Admitting: Internal Medicine

## 2018-10-02 DIAGNOSIS — B370 Candidal stomatitis: Secondary | ICD-10-CM

## 2018-10-02 DIAGNOSIS — R682 Dry mouth, unspecified: Secondary | ICD-10-CM

## 2018-10-04 ENCOUNTER — Telehealth: Payer: Self-pay

## 2018-10-04 NOTE — Telephone Encounter (Signed)
Patient states that she has a dry cough only. She's not sure if its a side effect from her meds. Patient states that we changed some medication in the past when she had it before.

## 2018-10-13 DIAGNOSIS — M961 Postlaminectomy syndrome, not elsewhere classified: Secondary | ICD-10-CM | POA: Insufficient documentation

## 2018-10-14 ENCOUNTER — Other Ambulatory Visit: Payer: Self-pay | Admitting: Internal Medicine

## 2018-10-15 ENCOUNTER — Encounter: Payer: Self-pay | Admitting: Adult Health

## 2018-10-15 ENCOUNTER — Ambulatory Visit: Payer: Medicare Other | Admitting: Adult Health

## 2018-10-15 ENCOUNTER — Other Ambulatory Visit: Payer: Self-pay

## 2018-10-15 VITALS — BP 124/85 | HR 75 | Wt 179.0 lb

## 2018-10-15 DIAGNOSIS — K5790 Diverticulosis of intestine, part unspecified, without perforation or abscess without bleeding: Secondary | ICD-10-CM

## 2018-10-15 DIAGNOSIS — Z79899 Other long term (current) drug therapy: Secondary | ICD-10-CM

## 2018-10-15 DIAGNOSIS — J479 Bronchiectasis, uncomplicated: Secondary | ICD-10-CM

## 2018-10-15 DIAGNOSIS — E782 Mixed hyperlipidemia: Secondary | ICD-10-CM

## 2018-10-15 DIAGNOSIS — K648 Other hemorrhoids: Secondary | ICD-10-CM

## 2018-10-15 DIAGNOSIS — Z0000 Encounter for general adult medical examination without abnormal findings: Secondary | ICD-10-CM

## 2018-10-15 DIAGNOSIS — E785 Hyperlipidemia, unspecified: Secondary | ICD-10-CM

## 2018-10-15 DIAGNOSIS — Z860101 Personal history of adenomatous and serrated colon polyps: Secondary | ICD-10-CM

## 2018-10-15 DIAGNOSIS — J301 Allergic rhinitis due to pollen: Secondary | ICD-10-CM

## 2018-10-15 DIAGNOSIS — E669 Obesity, unspecified: Secondary | ICD-10-CM

## 2018-10-15 DIAGNOSIS — R6889 Other general symptoms and signs: Secondary | ICD-10-CM

## 2018-10-15 DIAGNOSIS — G2581 Restless legs syndrome: Secondary | ICD-10-CM

## 2018-10-15 DIAGNOSIS — M797 Fibromyalgia: Secondary | ICD-10-CM

## 2018-10-15 DIAGNOSIS — K219 Gastro-esophageal reflux disease without esophagitis: Secondary | ICD-10-CM

## 2018-10-15 DIAGNOSIS — G4733 Obstructive sleep apnea (adult) (pediatric): Secondary | ICD-10-CM

## 2018-10-15 DIAGNOSIS — D519 Vitamin B12 deficiency anemia, unspecified: Secondary | ICD-10-CM

## 2018-10-15 DIAGNOSIS — K50919 Crohn's disease, unspecified, with unspecified complications: Secondary | ICD-10-CM

## 2018-10-15 DIAGNOSIS — I10 Essential (primary) hypertension: Secondary | ICD-10-CM | POA: Diagnosis not present

## 2018-10-15 DIAGNOSIS — Z0001 Encounter for general adult medical examination with abnormal findings: Secondary | ICD-10-CM | POA: Diagnosis not present

## 2018-10-15 DIAGNOSIS — M4802 Spinal stenosis, cervical region: Secondary | ICD-10-CM

## 2018-10-15 DIAGNOSIS — E1169 Type 2 diabetes mellitus with other specified complication: Secondary | ICD-10-CM

## 2018-10-15 DIAGNOSIS — Z86010 Personal history of colonic polyps: Secondary | ICD-10-CM

## 2018-10-15 DIAGNOSIS — K589 Irritable bowel syndrome without diarrhea: Secondary | ICD-10-CM

## 2018-10-15 DIAGNOSIS — K5900 Constipation, unspecified: Secondary | ICD-10-CM

## 2018-10-15 DIAGNOSIS — G459 Transient cerebral ischemic attack, unspecified: Secondary | ICD-10-CM

## 2018-10-15 DIAGNOSIS — I2510 Atherosclerotic heart disease of native coronary artery without angina pectoris: Secondary | ICD-10-CM | POA: Diagnosis not present

## 2018-10-15 DIAGNOSIS — E559 Vitamin D deficiency, unspecified: Secondary | ICD-10-CM

## 2018-10-15 DIAGNOSIS — N182 Chronic kidney disease, stage 2 (mild): Secondary | ICD-10-CM

## 2018-10-15 DIAGNOSIS — K5000 Crohn's disease of small intestine without complications: Secondary | ICD-10-CM

## 2018-10-15 DIAGNOSIS — E039 Hypothyroidism, unspecified: Secondary | ICD-10-CM

## 2018-10-15 DIAGNOSIS — E66811 Obesity, class 1: Secondary | ICD-10-CM

## 2018-10-15 DIAGNOSIS — E1122 Type 2 diabetes mellitus with diabetic chronic kidney disease: Secondary | ICD-10-CM

## 2018-10-15 DIAGNOSIS — F3341 Major depressive disorder, recurrent, in partial remission: Secondary | ICD-10-CM

## 2018-10-15 MED ORDER — PHENTERMINE HCL 37.5 MG PO TABS: ORAL_TABLET | ORAL | 2 refills | Status: DC

## 2018-10-15 NOTE — Progress Notes (Signed)
Virtual Visit via Telephone Note  I connected with Crystal [name] on 10/15/18 at 11:15 AM EDT by telephone and verified that I am speaking with the correct person using two identifiers.   I discussed the limitations, risks, security and privacy concerns of performing an evaluation and management service by telephone and the availability of in person appointments. I also discussed with the patient that there may be a patient responsible charge related to this service. The patient expressed understanding and agreed to proceed.   I discussed the assessment and treatment plan with the patient. The patient was provided an opportunity to ask questions and all were answered. The patient agreed with the plan and demonstrated an understanding of the instructions.   The patient was advised to call back or seek an in-person evaluation if the symptoms worsen or if the condition fails to improve as anticipated.  I provided 45 minutes of non-face-to-face time during this encounter.   Dan Maker, NP     MEDICARE ANNUAL WELLNESS VISIT AND FOLLOW UP  Assessment:    Medicare annual wellness visit, subsequent -due next year  Essential hypertension -well controlled currently -monitor at home -dash diet -exercise as tolerated   ASCVD (arteriosclerotic cardiovascular disease) -cont to maximize cholesterol management and BP management   Obstructive sleep apnea -emphasized adherence with CPAP   Allergic rhinitis, unspecified allergic rhinitis type -cont meds - suggested she rotate antihistamine q3-6 months  Prediabetes Discussed general issues about diabetes pathophysiology and management., Educational material distributed., Suggested low cholesterol diet., Encouraged aerobic exercise., Discussed foot care., Reminded to get yearly retinal exam. - Hemoglobin A1c at routine OVs in office q56m   Hypothyroidism, unspecified hypothyroidism type Hypothyroidism-check TSH level, continue  medications the same, reminded to take on an empty stomach 30-30mins before food.  - TSH   Hyperlipidemia -continue medications, check lipids, decrease fatty foods, increase activity.  - Lipid panel at q85m OV  Vitamin D deficiency -cont supplement  Medication management - CBC, CMP/GFR, magnesium routinely  Gastroesophageal reflux disease, esophagitis presence not specified -cont meds  Crohn's disease with complication, unspecified gastrointestinal tract location [facility]) -followed by Dr. Kinnie Scales Will be starting humira   IBS (irritable colon syndrome) -followed by Dr. Kinnie Scales  Fibromyalgia -followed by Dr. Ollen Bowl  Rotator cuff arthropathy, unspecified laterality -followed by ortho; pending surgery   RESTLESS LEG SYNDROME -followed by Dr. Ollen Bowl   Gait disorder -followed by neuro   Depression, remission partial -cont current meds   Morbid obesity, unspecified obesity type (HCC) -diet and exercise as tolerated   Cervical pain -followed by neurosurgery  Spinal stenosis in cervical region -followed by neurosurgery  Fatigue, unspecified type Multifactorial, continue to monitor  Anemia due to vitamin B12 deficiency, unspecified B12 deficiency type Continue supplement  Insomnia, unspecified type Sleep hygiene discussed  Morbid Obesity with co morbidities Long discussion about weight loss, diet, and exercise Discussed goal weight and initial weight loss goal (<170lb) Patient will work on limiting access to unhealthy snacking foods in the evening, have easily accessible "healthy" options Will restart the patient on phentermine-  AE's discussed, will do close follow up. Return as scheduled in 2 months.      Over 30 minutes of exam, counseling, chart review, and critical decision making was performed  Future Appointments  Date Time Provider Department Center  12/17/2018 11:15 AM [name], MD [facility] None    Plan:   During the course of the visit  the patient was educated and counseled about appropriate screening  and preventive services including:    Pneumococcal vaccine   Influenza vaccine  Td vaccine  Prevnar 13  Screening electrocardiogram  Screening mammography  Bone densitometry screening  Colorectal cancer screening  Diabetes screening  Glaucoma screening  Nutrition counseling   Advanced directives: given info/requested copies   Subjective:   Crystal Largeatricia Z Albanese is a 68 y.o. female who presents for Medicare Annual Wellness Visit and request for obesity management.  Patient has OSA & does not use her CPAP/BiPAP.  She has a Chronic Pain Syndrome consequent of multiple Neck & back surgeries & followed by pain management. 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 She has Crohn's managed by Dr. Kinnie ScalesMedoff, was on budesonide for recent flare with consequent 10 lb weight gain. She has tapered off of steroid and will be starting a trial of humira.    She has upcoming R shoulder total replacement scheduled for 11/15/2018 by Dr. Lelon MastAnakwenze through [facility].   BMI is Body mass index is 30.73 kg/m., she has been working on diet, was reportedly down to 170 lb but is struggling since budesonide treatment. Exercise is limited due to ongoing multiple orthopedic concerns and not having access to PT or facilities with covid 19. She has been on phentermine in the past that she found very helpful, was d/c'd last year after she hit a plateau and there wasn't progress. She would like to restart. She is watching her diet closely, but struggles with "munchies" at night.  Wt Readings from Last 3 Encounters:  10/15/18 179 lb (81.2 kg)  09/07/18 179 lb (81.2 kg)  08/24/18 183 lb (83 kg)   Her blood pressure has been controlled at home, today their BP is BP: 124/85 She does not workout, she has upcoming shoulder replacement, seeing specialist.   She denies chest pain, shortness of breath, dizziness.   She is not on  cholesterol medication and denies myalgias. Her cholesterol is at goal. The cholesterol last visit was:   Lab Results  Component Value Date   CHOL 170 02/10/2018   HDL 63 02/10/2018   LDLCALC 90 02/10/2018   TRIG 79 02/10/2018   CHOLHDL 2.7 02/10/2018   She has been working on diet for prediabetes, and denies foot ulcerations, hyperglycemia, hypoglycemia , increased appetite, nausea, paresthesia of the feet, polydipsia, polyuria, visual disturbances, vomiting and weight loss. Last A1C in the office was:  Lab Results  Component Value Date   HGBA1C 6.2 (H) 02/10/2018   Last GFR Lab Results  Component Value Date   GFRNONAA >60 06/20/2018   Patient is on Vitamin D supplement.  Lab Results  Component Value Date   VD25OH 107 (H) 02/10/2018      Medication Review Current Outpatient Medications on File Prior to Visit  Medication Sig  . aspirin EC 81 MG tablet Take 81 mg by mouth daily.  . bisoprolol-hydrochlorothiazide (ZIAC) 5-6.25 MG tablet Take 1 tablet by mouth 2 (two) times daily.  . budesonide (ENTOCORT EC) 3 MG 24 hr capsule Take 3 mg by mouth daily.  [name]. buPROPion (WELLBUTRIN XL) 300 MG 24 hr tablet TAKE 1 TABLET BY MOUTH EVERY MORNING FOR MOOD AND FOCUS ATTENTION  . cephALEXin (KEFLEX) 500 MG capsule Take 4 capsules by mouth, 2 hours prior to dental procedure.  . Cyanocobalamin (B-12 SL) Place under the tongue daily.  . cyclobenzaprine (FLEXERIL) 5 MG tablet TAKE 1/2 TO 1 TABLET BY MOUTH 2-3 TIMES A DAY ONLY IF NEEDED FOR MUSCLE SPASM  .  diclofenac sodium (VOLTAREN) 1 % GEL Apply 2 g topically 2 (two) times daily as needed.   [name] DIGESTIVE ENZYMES PO Take by mouth 2 (two) times daily.  . DULoxetine (CYMBALTA) 60 MG capsule TAKE 1 CAPSULE BY MOUTH 2 TIMES A DAY FOR PAIN.  . fenofibrate micronized (LOFIBRA) 134 MG capsule TAKE 1 CAPSULE BY MOUTH DAILY BEFORE BREAKFAST  . ferrous sulfate 325 (65 FE) MG tablet Take 325 mg by mouth daily.   . fluticasone (FLONASE) 50 MCG/ACT nasal  spray USE TWO SPRAYS IN EACH NOSTRIL DAILY AS NEEDED  . gabapentin (NEURONTIN) 300 MG capsule TAKE ONE CAPSULE 3 TIMES A DAY  . levothyroxine (SYNTHROID, LEVOTHROID) 100 MCG tablet TAKE 1 TABLET BY MOUTH ON TUES, WED, THURS, SAT AND SUN. TAKE 1& 1/2 TABLET ON MON AND FRI  . lidocaine (XYLOCAINE) 2 % jelly apply small amount topically to affected area 2-3 times a day  . meclizine (ANTIVERT) 25 MG tablet TAKE 1 TABLET (25 MG TOTAL) BY MOUTH 3 (THREE) TIMES DAILY AS NEEDED FOR DIZZINESS.  . mupirocin ointment (BACTROBAN) 2 % Place 1 application into the nose 2 (two) times daily.  . NEOMYCIN-POLYMYXIN-HYDROCORTISONE (CORTISPORIN) 1 % SOLN OTIC solution Place 3 drops into both ears 4 (four) times daily.  [name] nystatin (MYCOSTATIN) 100000 UNIT/ML suspension RETAIN 5 ML IN MOUTH AS LONG AS POSSIBLE 4 TIMES A DAY (SWISH & SPIT) USE 48 HRS AFTER SYMPTOMS RESOLVE  . ondansetron (ZOFRAN ODT) 4 MG disintegrating tablet Take 1 tablet (4 mg total) by mouth every 8 (eight) hours as needed for nausea or vomiting.  [name] oxybutynin (DITROPAN) 5 MG tablet Take 1 tablet (5 mg total) by mouth 2 (two) times daily.  [name] Phenylephrine-guaiFENesin (MUCUS RELIEF D PO) Take by mouth daily.  . polyethylene glycol (MIRALAX / GLYCOLAX) packet Take 17 g by mouth daily as needed for mild constipation.  . Probiotic Product (PROBIOTIC DAILY PO) Take by mouth daily.  [name] rOPINIRole (REQUIP) 2 MG tablet TAKE 1 TABLET 4 TIMES A DAY FOR RESTLESS LEG SYNDROME  . TRAMADOL HCL ER PO Take by mouth.  . Vitamin D, Ergocalciferol, (DRISDOL) 1.25 MG (50000 UT) CAPS capsule TAKE 1 CAPSULE BY MOUTH DAILY.  [name] Wheat Dextrin (BENEFIBER PO) Take by mouth.   No current facility-administered medications on file prior to visit.      Current Problems (verified) Patient Active Problem List   Diagnosis Date Noted  . Diabetes mellitus with chronic kidney disease (HCC) 07/04/2018  . History of adenomatous polyp of colon 10/18/2017  . Bronchiectasis without  complication (HCC) 10/12/2017  . Diverticulosis 10/12/2017  . Internal hemorrhoids 10/12/2017  . Polypharmacy 04/09/2017  . Arthritis of carpometacarpal (CMC) joints of both thumbs 02/12/2017  . Cervical disc disease 01/23/2017  . Constipation 01/23/2017  . Crohn's ileitis (HCC) 01/23/2017  . TIA (transient ischemic attack) 01/23/2017  . S/P revision of total knee, left 12/18/2016  . B12 deficiency anemia 06/16/2016  . Sleep talking 03/26/2016  . IBS (irritable colon syndrome) 08/09/2015  . Spinal stenosis in cervical region 06/14/2015  . Other specified arthropathy involving shoulder region 11/23/2014  . Obesity (BMI 30.0-34.9) 10/31/2014  . Medication management 10/25/2013  . Rotator cuff arthropathy 09/15/2013  . Hyperlipidemia   . Type 2 diabetes mellitus with hyperlipidemia (HCC)   . Hypothyroid   . Depression, major, recurrent, in partial remission (HCC)   . Vitamin D deficiency   . ASCVD   . Gait disorder 10/26/2012  . Knee pain 09/07/2012  . Rotator  cuff tear 08/23/2012  . Bilateral bunions 11/19/2011  . Rhinitis 06/25/2010  . Obstructive sleep apnea 06/21/2010  . RESTLESS LEG SYNDROME 06/21/2010  . Essential hypertension 06/21/2010  . GERD 06/21/2010  . Crohn's Enterocolitis / Regional enteritis 06/21/2010  . Fibromyalgia 06/21/2010    Screening Tests Immunization History  Administered Date(s) Administered  . DT 01/03/2015  . Influenza Split 04/16/2018  . Influenza Whole 03/30/2010  . Influenza, High Dose Seasonal PF 03/13/2015  . Influenza-Unspecified 03/31/2011, 05/14/2014, 02/27/2016  . Pneumococcal Conjugate-13 04/17/2015  . Pneumococcal Polysaccharide-23 01/06/2017  . Pneumococcal-Unspecified 06/30/2005  . Td 06/30/2004  . Zoster 07/01/2007    Preventative care: Last colonoscopy: Dr. Kinnie Scales, 09/2017 Last mammogram: 03/2018 DEXA: 2019 T -1.2 Sleep study 04/2016    Prior vaccinations: TD or Tdap: 2016  Pneumococcal: 2018 Prevnar13:  2016 Shingles/Zostavax: 2009  Names of Other Physician/Practitioners you currently use: 1. [facility]- here for primary care 2. Dr. Lavell Islam, eye doctor, last visit 2020  3. Dr. Alto Denver, dentist, last visit 2019 q 6 months   Patient Care Team: [name], MD as PCP - General (Internal Medicine) Huston Foley, MD as Attending Physician (Neurology) Drema Halon, MD as Consulting Physician (Otolaryngology) Sharrell Ku, MD as Consulting Physician (Gastroenterology) Maeola Harman, MD as Consulting Physician (Neurosurgery) Antony Contras, MD as Consulting Physician (Ophthalmology) Campbell Stall, MD as Consulting Physician (Dermatology) Dannielle Huh, MD as Consulting Physician (Orthopedic Surgery) Kennith Maes, MD as Referring Physician (Orthopedic Surgery)  Allergies Allergies  Allergen Reactions  . Atorvastatin Other (See Comments)    Extreme pain  . Codeine Other (See Comments)    Headache  . Pravastatin Other (See Comments)    Exreme pain  . Statins Other (See Comments)    Extreme pain  . Amitriptyline Other (See Comments)    Unspecified  . Fish Oil Nausea Only  . Linseed Oil Other (See Comments)    Other Reaction: Other reaction  . Nuvigil [Armodafinil] Other (See Comments)    Unspecified  . Sulfa Antibiotics Other (See Comments)  . Erythromycin Other (See Comments)    Reaction unspecified  . Erythromycin Base Rash  . Flax Seed [Bio-Flax] Rash    Linseed  . Hydrocodone-Acetaminophen Rash  . Morphine Nausea And Vomiting and Rash  . Nsaids Nausea Only  . Sertraline Rash  . Sinemet [Carbidopa W-Levodopa] Rash  . Sulfamethoxazole Rash    SURGICAL HISTORY She  has a past surgical history that includes Shoulder adhesion release; Spine surgery; Appendectomy (1960); Cesarean section (1610 & 1886); Abdominal hysterectomy (1992); Rotator cuff repair (Left, 2005); Tonsillectomy (1960); Joint replacement; Toe  Surgery; Toe Surgery; Anterior cervical decompression/discectomy fusion 4 level (N/A, 06/14/2015); ORIF femur fracture (Left, 03/24/2017); and Knee surgery. FAMILY HISTORY Her family history includes Breast cancer in her mother; Cancer in her mother; Depression in her sister; Diabetes in her mother; Heart disease in her father; Hyperlipidemia in her father; Hypertension in her father; Leukemia in her sister; Lupus in her sister; Migraines in her daughter; Neuropathy in her father; Osteoporosis in her mother; Rheum arthritis in her sister. SOCIAL HISTORY She  reports that she has never smoked. She has never used smokeless tobacco. She reports current alcohol use. She reports that she does not use drugs.  MEDICARE WELLNESS OBJECTIVES: Physical activity: Current Exercise Habits: The patient does not participate in regular exercise at present, Exercise limited by: orthopedic condition(s) Cardiac risk factors: Cardiac Risk Factors include: advanced age (>23men, >59 women);hypertension;dyslipidemia;obesity (BMI >30kg/m2);sedentary lifestyle Depression/mood screen:  Depression screen PHQ 2/9 10/15/2018  Decreased Interest 0  Down, Depressed, Hopeless 1  PHQ - 2 Score 1  Altered sleeping -  Tired, decreased energy -  Change in appetite -  Feeling bad or failure about yourself  -  Trouble concentrating -  Moving slowly or fidgety/restless -  Suicidal thoughts -  PHQ-9 Score -  Some recent data might be hidden    ADLs:  In your present state of health, do you have any difficulty performing the following activities: 10/15/2018 06/02/2018  Hearing? N N  Vision? N N  Difficulty concentrating or making decisions? N N  Walking or climbing stairs? N N  Dressing or bathing? N N  Doing errands, shopping? N N  Some recent data might be hidden     Cognitive Testing  Alert? Yes  Normal Appearance?Yes  Oriented to person? Yes  Place? Yes   Time? Yes  Recall of three objects?  Yes  Can perform simple  calculations? Yes  Displays appropriate judgment?Yes  Can read the correct time from a watch face?Yes  EOL planning: Does Patient Have a Medical Advance Directive?: Yes Type of Advance Directive: Living will Does patient want to make changes to medical advance directive?: No - Patient declined   Objective:   Today's Vitals   10/15/18 1124  BP: 124/85  Pulse: 75  Weight: 179 lb (81.2 kg)   Body mass index is 30.73 kg/m.  General : Well sounding patient in no apparent distress HEENT: no hoarseness, no cough for duration of visit Lungs: speaks in complete sentences, no audible wheezing, no apparent distress Neurological: alert, oriented x 3 Psychiatric: pleasant, judgement appropriate    Medicare Attestation I have personally reviewed: The patient's medical and social history Their use of alcohol, tobacco or illicit drugs Their current medications and supplements The patient's functional ability including ADLs,fall risks, home safety risks, cognitive, and hearing and visual impairment Diet and physical activities Evidence for depression or mood disorders  The patient's weight, height, BMI, and visual acuity have been recorded in the chart.  I have made referrals, counseling, and provided education to the patient based on review of the above and I have provided the patient with a written personalized care plan for preventive services.     Dan Maker, NP   10/15/2018

## 2018-10-18 ENCOUNTER — Other Ambulatory Visit: Payer: Self-pay

## 2018-10-25 ENCOUNTER — Other Ambulatory Visit: Payer: Self-pay | Admitting: *Deleted

## 2018-10-25 MED ORDER — BUPROPION HCL ER (XL) 300 MG PO TB24: ORAL_TABLET | ORAL | 1 refills | Status: DC

## 2018-10-28 ENCOUNTER — Ambulatory Visit: Payer: Medicare Other | Admitting: Adult Health

## 2018-10-28 ENCOUNTER — Other Ambulatory Visit: Payer: Self-pay

## 2018-10-28 VITALS — BP 124/80 | HR 80 | Wt 179.0 lb

## 2018-10-28 DIAGNOSIS — M79661 Pain in right lower leg: Secondary | ICD-10-CM | POA: Diagnosis not present

## 2018-10-28 DIAGNOSIS — R0781 Pleurodynia: Secondary | ICD-10-CM

## 2018-10-28 DIAGNOSIS — Z79899 Other long term (current) drug therapy: Secondary | ICD-10-CM | POA: Diagnosis not present

## 2018-10-28 DIAGNOSIS — W19XXXA Unspecified fall, initial encounter: Secondary | ICD-10-CM

## 2018-10-28 NOTE — Progress Notes (Signed)
Virtual Visit via Telephone Note  I connected with Crystal [name] on 10/28/18 at  2:45 PM EDT by telephone and verified that I am speaking with the correct person using two identifiers.  Location: Patient: home [facility]: [facility]   I discussed the limitations, risks, security and privacy concerns of performing an evaluation and management service by telephone and the availability of in person appointments. I also discussed with the patient that there may be a patient responsible charge related to this service. The patient expressed understanding and agreed to proceed.   History of Present Illness:  68 y.o. called to report multiple falls over the past 2 weeks (tripped while walking with husband, fell off of bed multiple times-she attributes this to an old slanted mattress that she plans to replace soon) requests to discuss R sided chest "rib" pain and L calf/knee pain. She denies head injury with any falls, denies neck pain. She denies syncope, dizziness or LOC.   She has hx of extensive orthopedic surgeries and recurrent knee problems, including persistent recurrent Baker's cyst for which she has seen several ortho providers (Dr. Valentina GuLucy, Dr. Carola FrostHandy, etc) and after an attempt to aspirate has apparently been advised "there is nothing else to do," and surgery was not recommended. She reports after most recent fall over the weekend, she believes she "burst" the cyst again and has new L calf pain and swelling. She reports area is warm to touch, not notably erythematous. She has been instructed to wrap, ice and elevate which reportedly has been helping some.   She additionally reports R sided chest pain without dyspnea, tender to palpation under breast and side, pain is improved with rest and worse with any twisting motions. She additionally has new intermittent mild cough (non-productive) and is concerned this may be related. She denies dyspnea, mucus, fever/chills. She does endorse mild allergies that are  untreated.   She has multiple prescribed pain medications including tramadol, gabapentin (300 mg which she reports taking TID), lidocaine jelly, voltaren. She additionally is prescribed cymbalta, flexeril, and several other high risk medications including robinirole and oxybutynin.   has Obstructive sleep apnea; RESTLESS LEG SYNDROME; Essential hypertension; Rhinitis; GERD; Crohn's Enterocolitis / Regional enteritis; Fibromyalgia; Gait disorder; Hyperlipidemia; Type 2 diabetes mellitus with hyperlipidemia (HCC); Hypothyroid; Depression, major, recurrent, in partial remission (HCC); Vitamin D deficiency; ASCVD; Medication management; Rotator cuff arthropathy; Obesity (BMI 30.0-34.9); Spinal stenosis in cervical region; IBS (irritable colon syndrome); Sleep talking; B12 deficiency anemia; Polypharmacy; Bronchiectasis without complication (HCC); Arthritis of carpometacarpal (CMC) joints of both thumbs; Bilateral bunions; Cervical disc disease; Constipation; Crohn's ileitis (HCC); Rotator cuff tear; Diverticulosis; History of adenomatous polyp of colon; Internal hemorrhoids; Knee pain; Other specified arthropathy involving shoulder region; S/P revision of total knee, left; TIA (transient ischemic attack); and Diabetes mellitus with chronic kidney disease (HCC) on their problem list.  Current Outpatient Medications on File Prior to Visit  Medication Sig Dispense Refill  . aspirin EC 81 MG tablet Take 81 mg by mouth daily.    . bisoprolol-hydrochlorothiazide (ZIAC) 5-6.25 MG tablet Take 1 tablet by mouth 2 (two) times daily. 90 tablet 2  . budesonide (ENTOCORT EC) 3 MG 24 hr capsule Take 3 mg by mouth daily.    [name]. buPROPion (WELLBUTRIN XL) 300 MG 24 hr tablet Take 1 tablet daily for mood and focus. 90 tablet 1  . cephALEXin (KEFLEX) 500 MG capsule Take 4 capsules by mouth, 2 hours prior to dental procedure. 40 capsule 0  . Cyanocobalamin (B-12 SL)  Place under the tongue daily.    . cyclobenzaprine (FLEXERIL)  5 MG tablet TAKE 1/2 TO 1 TABLET BY MOUTH 2-3 TIMES A DAY ONLY IF NEEDED FOR MUSCLE SPASM 90 tablet 1  . diclofenac sodium (VOLTAREN) 1 % GEL Apply 2 g topically 2 (two) times daily as needed.   2  . DIGESTIVE ENZYMES PO Take by mouth 2 (two) times daily.    . DULoxetine (CYMBALTA) 60 MG capsule TAKE 1 CAPSULE BY MOUTH 2 TIMES A DAY FOR PAIN. 180 capsule 1  . fenofibrate micronized (LOFIBRA) 134 MG capsule TAKE 1 CAPSULE BY MOUTH DAILY BEFORE BREAKFAST 90 capsule 3  . ferrous sulfate 325 (65 FE) MG tablet Take 325 mg by mouth daily.     . fluticasone (FLONASE) 50 MCG/ACT nasal spray USE TWO SPRAYS IN EACH NOSTRIL DAILY AS NEEDED 48 g 3  . gabapentin (NEURONTIN) 300 MG capsule TAKE ONE CAPSULE 3 TIMES A DAY 270 capsule 0  . levothyroxine (SYNTHROID, LEVOTHROID) 100 MCG tablet TAKE 1 TABLET BY MOUTH ON TUES, WED, THURS, SAT AND SUN. TAKE 1& 1/2 TABLET ON MON AND FRI 102 tablet 3  . meclizine (ANTIVERT) 25 MG tablet TAKE 1 TABLET (25 MG TOTAL) BY MOUTH 3 (THREE) TIMES DAILY AS NEEDED FOR DIZZINESS. 90 tablet 0  . mupirocin ointment (BACTROBAN) 2 % Place 1 application into the nose 2 (two) times daily.    . NEOMYCIN-POLYMYXIN-HYDROCORTISONE (CORTISPORIN) 1 % SOLN OTIC solution Place 3 drops into both ears 4 (four) times daily. 10 mL 0  . nystatin (MYCOSTATIN) 100000 UNIT/ML suspension RETAIN 5 ML IN MOUTH AS LONG AS POSSIBLE 4 TIMES A DAY (SWISH & SPIT) USE 48 HRS AFTER SYMPTOMS RESOLVE 360 mL 0  . ondansetron (ZOFRAN ODT) 4 MG disintegrating tablet Take 1 tablet (4 mg total) by mouth every 8 (eight) hours as needed for nausea or vomiting. 20 tablet 0  . oxybutynin (DITROPAN) 5 MG tablet Take 1 tablet (5 mg total) by mouth 2 (two) times daily. 180 tablet 1  . phentermine (ADIPEX-P) 37.5 MG tablet Take 1/2-1 tab daily as needed for appetite and weight loss. Take regular drug breaks to avoid tolerance. 30 tablet 2  . Phenylephrine-guaiFENesin (MUCUS RELIEF D PO) Take by mouth daily.    . polyethylene  glycol (MIRALAX / GLYCOLAX) packet Take 17 g by mouth daily as needed for mild constipation. 14 each 0  . Probiotic Product (PROBIOTIC DAILY PO) Take by mouth daily.    [name] rOPINIRole (REQUIP) 2 MG tablet TAKE 1 TABLET 4 TIMES A DAY FOR RESTLESS LEG SYNDROME 360 tablet 1  . TRAMADOL HCL ER PO Take by mouth.    . Vitamin D, Ergocalciferol, (DRISDOL) 1.25 MG (50000 UT) CAPS capsule TAKE 1 CAPSULE BY MOUTH DAILY. 90 capsule 1  . Wheat Dextrin (BENEFIBER PO) Take by mouth.    . lidocaine (XYLOCAINE) 2 % jelly apply small amount topically to affected area 2-3 times a day (Patient not taking: Reported on 10/28/2018) 30 mL 0   No current facility-administered medications on file prior to visit.     Past Surgical History:  Procedure Laterality Date  . ABDOMINAL HYSTERECTOMY  1992  . ANTERIOR CERVICAL DECOMPRESSION/DISCECTOMY FUSION 4 LEVELS N/A 06/14/2015   Procedure: Cervical three-four Cervical four-five Cervical five- six Cervical six- seven  Anterior cervical decompression/diskectomy/fusion;  Surgeon: Maeola Harman, MD;  Location: [facility];  Service: Neurosurgery;  Laterality: N/A;  C3-4 C4-5 C5-6 C6-7 Anterior cervical decompression/diskectomy/fusion  . APPENDECTOMY  1960  .  CESAREAN SECTION  1983 & 1886  . JOINT REPLACEMENT     2   LEFT  1 RIGHT   . KNEE SURGERY    . ORIF FEMUR FRACTURE Left 03/24/2017   Procedure: OPEN REDUCTION INTERNAL FIXATION (ORIF) FEMUR FRACTURE;  Surgeon: Myrene Galas, MD;  Location: [facility] OR;  Service: Orthopedics;  Laterality: Left;  . ROTATOR CUFF REPAIR Left 2005   2 LEFT  1  RIGHT  . SHOULDER ADHESION RELEASE    . SPINE SURGERY    . TOE SURGERY     LEFT       . TOE SURGERY     RIGHT  BIG TOE  . TONSILLECTOMY  1960      Observations/Objective:  General : Well sounding patient in no apparent distress HEENT: no hoarseness, no cough for duration of visit Lungs: speaks in complete sentences, no audible wheezing, no apparent distress Neurological: alert,  oriented x 3 Psychiatric: pleasant, judgement appropriate    Assessment and Plan:  Lizmarie was seen today for cough, muscle pain and fall.  Diagnoses and all orders for this visit:  Fall, initial encounter Fall etiology reviewed; discussed fall prevention, discussed sedating medications.   Polypharmacy Reviewed medications briefly with patient, discussed several high risk/sedating medications may be contributing to falls. Advised to avoid taking tramadol, flexeril, gabapentin concurrently. She agrees to avoid concurrent use of these items, will limit gabapentin use during the day, reports rare use of tramadol. She expresses understanding of risks with these medications.   Rib pain on left side -     DG Ribs Unilateral Left; Future  Right calf pain Will r/o DVT; advised continue ortho recommendations and follow up there if negative -     VAS [place] LOWER EXTREMITY VENOUS (DVT); Future  Follow Up Instructions:    I discussed the assessment and treatment plan with the patient. The patient was provided an opportunity to ask questions and all were answered. The patient agreed with the plan and demonstrated an understanding of the instructions.   The patient was advised to call back or seek an in-person evaluation if the symptoms worsen or if the condition fails to improve as anticipated.  I provided 25 minutes of non-face-to-face time during this encounter.   Dan Maker, NP

## 2018-10-29 ENCOUNTER — Encounter: Payer: Self-pay | Admitting: Adult Health

## 2018-11-01 ENCOUNTER — Telehealth: Payer: Self-pay | Admitting: Adult Health

## 2018-11-01 NOTE — Telephone Encounter (Signed)
patient called to request right knee imaging for baker's cyst behind right knee. The patient states self diagnosis from similar encounter in December. Patient states has baker cyst behind right knee; it has burst and draining into calf.  States she contacted Dr Ronnie Derby, without remedy. She states she wanted to see what condition the knee is in before getting a referral from her [place] to a knee specialist at [facility].  Please advise your recomendation.

## 2018-11-01 NOTE — Telephone Encounter (Signed)
LEFT VOICEMAIL FOR [facility] TO CALL PATIENT AND SCHEDULE VAS ULTRASOUND

## 2018-11-02 ENCOUNTER — Other Ambulatory Visit: Payer: Self-pay

## 2018-11-02 ENCOUNTER — Ambulatory Visit (HOSPITAL_COMMUNITY)
Admission: RE | Admit: 2018-11-02 | Discharge: 2018-11-02 | Disposition: A | Discharge status: Home / Self Care | Payer: Medicare Other | Source: Ambulatory Visit | Attending: Adult Health | Admitting: Adult Health

## 2018-11-02 DIAGNOSIS — M79661 Pain in right lower leg: Secondary | ICD-10-CM | POA: Diagnosis present

## 2018-11-03 ENCOUNTER — Encounter: Payer: Self-pay | Admitting: Adult Health

## 2018-11-03 DIAGNOSIS — M660 Rupture of popliteal cyst: Secondary | ICD-10-CM | POA: Insufficient documentation

## 2018-11-05 ENCOUNTER — Other Ambulatory Visit: Payer: Self-pay | Admitting: Internal Medicine

## 2018-11-08 ENCOUNTER — Encounter: Payer: Self-pay | Admitting: Internal Medicine

## 2018-11-13 ENCOUNTER — Other Ambulatory Visit: Payer: Self-pay | Admitting: Internal Medicine

## 2018-11-13 DIAGNOSIS — R682 Dry mouth, unspecified: Secondary | ICD-10-CM

## 2018-11-13 DIAGNOSIS — B370 Candidal stomatitis: Secondary | ICD-10-CM

## 2018-11-15 DIAGNOSIS — Z8673 Personal history of transient ischemic attack (TIA), and cerebral infarction without residual deficits: Secondary | ICD-10-CM | POA: Insufficient documentation

## 2018-11-16 MED ORDER — LEVOTHYROXINE SODIUM 100 MCG PO TABS
100.00 | ORAL_TABLET | ORAL | Status: DC
Start: 2018-11-17 — End: 2018-11-16

## 2018-11-16 MED ORDER — ACETAMINOPHEN 325 MG PO TABS
650.00 | ORAL_TABLET | ORAL | Status: DC
Start: 2018-11-16 — End: 2018-11-16

## 2018-11-16 MED ORDER — BUDESONIDE 3 MG PO CPEP
9.00 | ORAL_CAPSULE | ORAL | Status: DC
Start: 2018-11-17 — End: 2018-11-16

## 2018-11-16 MED ORDER — DULOXETINE HCL 60 MG PO CPEP
60.00 | ORAL_CAPSULE | ORAL | Status: DC
Start: 2018-11-16 — End: 2018-11-16

## 2018-11-16 MED ORDER — GENERIC EXTERNAL MEDICATION
Status: DC
Start: ? — End: 2018-11-16

## 2018-11-16 MED ORDER — FAMOTIDINE 40 MG PO TABS
40.00 | ORAL_TABLET | ORAL | Status: DC
Start: 2018-11-16 — End: 2018-11-16

## 2018-11-16 MED ORDER — LACTATED RINGERS IV SOLN
INTRAVENOUS | Status: DC
Start: ? — End: 2018-11-16

## 2018-11-16 MED ORDER — HYDROMORPHONE HCL 1 MG/ML IJ SOLN
.50 | INTRAMUSCULAR | Status: DC
Start: ? — End: 2018-11-16

## 2018-11-16 MED ORDER — HYDROMORPHONE HCL 2 MG PO TABS
2.00 | ORAL_TABLET | ORAL | Status: DC
Start: ? — End: 2018-11-16

## 2018-11-16 MED ORDER — ONDANSETRON HCL 4 MG PO TABS
4.00 | ORAL_TABLET | ORAL | Status: DC
Start: ? — End: 2018-11-16

## 2018-11-16 MED ORDER — CALCIUM CARBONATE ANTACID 750 MG PO CHEW
CHEWABLE_TABLET | ORAL | Status: DC
Start: ? — End: 2018-11-16

## 2018-11-16 MED ORDER — BISACODYL 10 MG RE SUPP
10.00 | RECTAL | Status: DC
Start: ? — End: 2018-11-16

## 2018-11-16 MED ORDER — BUPROPION HCL ER (XL) 150 MG PO TB24
300.00 | ORAL_TABLET | ORAL | Status: DC
Start: 2018-11-17 — End: 2018-11-16

## 2018-11-16 MED ORDER — LIDOCAINE HCL 1 % IJ SOLN
0.50 | INTRAMUSCULAR | Status: DC
Start: ? — End: 2018-11-16

## 2018-11-16 MED ORDER — CYCLOBENZAPRINE HCL 10 MG PO TABS
5.00 | ORAL_TABLET | ORAL | Status: DC
Start: 2018-11-16 — End: 2018-11-16

## 2018-11-16 MED ORDER — GABAPENTIN 300 MG PO CAPS
300.00 | ORAL_CAPSULE | ORAL | Status: DC
Start: 2018-11-16 — End: 2018-11-16

## 2018-11-16 MED ORDER — GUAIFENESIN ER 600 MG PO TB12
1200.00 | ORAL_TABLET | ORAL | Status: DC
Start: 2018-11-17 — End: 2018-11-16

## 2018-11-16 MED ORDER — ROPINIROLE HCL 2 MG PO TABS
2.00 | ORAL_TABLET | ORAL | Status: DC
Start: 2018-11-16 — End: 2018-11-16

## 2018-11-16 MED ORDER — ASPIRIN EC 81 MG PO TBEC
81.00 | DELAYED_RELEASE_TABLET | ORAL | Status: DC
Start: 2018-11-16 — End: 2018-11-16

## 2018-11-16 MED ORDER — POLYETHYLENE GLYCOL 3350 17 G PO PACK
1.00 | PACK | ORAL | Status: DC
Start: 2018-11-17 — End: 2018-11-16

## 2018-11-16 MED ORDER — OXYBUTYNIN CHLORIDE 5 MG PO TABS
5.00 | ORAL_TABLET | ORAL | Status: DC
Start: 2018-11-16 — End: 2018-11-16

## 2018-11-16 MED ORDER — SENNOSIDES-DOCUSATE SODIUM 8.6-50 MG PO TABS
1.00 | ORAL_TABLET | ORAL | Status: DC
Start: 2018-11-16 — End: 2018-11-16

## 2018-11-17 ENCOUNTER — Other Ambulatory Visit: Payer: Self-pay | Admitting: Adult Health

## 2018-11-17 ENCOUNTER — Telehealth: Payer: Self-pay

## 2018-11-17 DIAGNOSIS — R059 Cough, unspecified: Secondary | ICD-10-CM

## 2018-11-17 DIAGNOSIS — R05 Cough: Secondary | ICD-10-CM

## 2018-11-17 DIAGNOSIS — J40 Bronchitis, not specified as acute or chronic: Secondary | ICD-10-CM

## 2018-11-17 MED ORDER — AZITHROMYCIN 250 MG PO TABS: ORAL_TABLET | ORAL | 1 refills | Status: AC

## 2018-11-17 MED ORDER — PREDNISONE 20 MG PO TABS: ORAL_TABLET | ORAL | 0 refills | Status: DC

## 2018-11-17 MED ORDER — BENZONATATE 200 MG PO CAPS: ORAL_CAPSULE | ORAL | 1 refills | Status: DC

## 2018-11-17 NOTE — Telephone Encounter (Signed)
Patient had shoulder surgery on 11/15/18. Had a minor cough prior to the surgery and now it has gotten progressively worse. Uncertain if she is running a fever, doesn't have a working thermometer. Currently taking Promethazine DM which she had taken previously and had a refill available. Really needs some kind of relief. Please advise

## 2018-11-18 ENCOUNTER — Telehealth: Payer: Self-pay | Admitting: Pulmonary Disease

## 2018-11-18 NOTE — Telephone Encounter (Signed)
Message left with husband and appointment scheduled.

## 2018-11-18 NOTE — Telephone Encounter (Signed)
Spoke with pt husband (DPR) and he stated pt had surgery at [facility].  Pt's O2 sats were low after surgery and little SOB.  He spoke to pt's doctors and they want her seen by pulmonology as soon as possible.  Husband stated especially since long weekend upcoming.  OV scheduled 5/22 at 3pm.  Nothing further is needed.

## 2018-11-19 ENCOUNTER — Ambulatory Visit: Payer: Medicare Other

## 2018-11-19 ENCOUNTER — Other Ambulatory Visit: Payer: Self-pay

## 2018-11-19 ENCOUNTER — Ambulatory Visit: Payer: Medicare Other | Admitting: Primary Care

## 2018-11-19 DIAGNOSIS — R0602 Shortness of breath: Secondary | ICD-10-CM

## 2018-11-19 DIAGNOSIS — R05 Cough: Secondary | ICD-10-CM

## 2018-11-19 DIAGNOSIS — R059 Cough, unspecified: Secondary | ICD-10-CM

## 2018-11-19 NOTE — Progress Notes (Deleted)
@Patient  ID: Crystal [name], female    DOB: 04-Aug-1950, 68 y.o.   MRN: [id number]  No chief complaint on file.   Referring provider: Unk Pinto, MD  HPI: 68 year old female, never smoked. PMH significant for bronchiectasis, OSA, HTN, TIA, Crohn's, diverticulosis, GERD, rotator cuff arthropathy. Patient of Dr. Halford Chessman, last seen on 07/26/18.   11/19/2018 Patient presents today with complaints of shortness of breath. Patient recently underwent should surgery at [facility] on 11/15/18.     Allergies  Allergen Reactions   Atorvastatin Other (See Comments)    Extreme pain   Codeine Other (See Comments)    Headache   Pravastatin Other (See Comments)    Exreme pain   Statins Other (See Comments)    Extreme pain   Amitriptyline Other (See Comments)    Unspecified   Fish Oil Nausea Only   Linseed Oil Other (See Comments)    Other Reaction: Other reaction   Nuvigil [Armodafinil] Other (See Comments)    Unspecified   Sulfa Antibiotics Other (See Comments)   Erythromycin Other (See Comments)    Reaction unspecified   Erythromycin Base Rash   Flax Seed [Bio-Flax] Rash    Linseed   Hydrocodone-Acetaminophen Rash   Morphine Nausea And Vomiting and Rash   Nsaids Nausea Only   Sertraline Rash   Sinemet [Carbidopa W-Levodopa] Rash   Sulfamethoxazole Rash    Immunization History  Administered Date(s) Administered   DT 01/03/2015   Influenza Split 04/16/2018   Influenza Whole 03/30/2010   Influenza, High Dose Seasonal PF 03/13/2015   Influenza-Unspecified 03/31/2011, 05/14/2014, 02/27/2016   Pneumococcal Conjugate-13 04/17/2015   Pneumococcal Polysaccharide-23 01/06/2017   Pneumococcal-Unspecified 06/30/2005   Td 06/30/2004   Zoster 07/01/2007    Past Medical History:  Diagnosis Date   Arthritis    ASCVD (arteriosclerotic cardiovascular disease)    Closed nondisplaced subtrochanteric fracture of left femur ([place]) 09/03/2017   September  2018.  Status post surgery by Dr. Marcelino Scot   Depression    Fibromyalgia    Gait disorder 10/26/2012   GERD (gastroesophageal reflux disease)    GI bleed    Hyperlipidemia    Hypertension    Hypothyroid    Periprosthetic supracondylar fracture of femur, left  03/23/2017   Restless leg syndrome    Rhinitis 06/25/2010   Sleep apnea    TIA (transient ischemic attack)    3         Vitamin D deficiency     Tobacco History: Social History   Tobacco Use  Smoking Status Never Smoker  Smokeless Tobacco Never Used   Counseling given: Not Answered   Outpatient Medications Prior to Visit  Medication Sig Dispense Refill   aspirin EC 81 MG tablet Take 81 mg by mouth daily.     azithromycin (ZITHROMAX) 250 MG tablet Take 2 tablets (500 mg) on  Day 1,  followed by 1 tablet (250 mg) once daily on Days 2 through 5. 6 each 1   benzonatate (TESSALON) 200 MG capsule Take 1 cap three times daily as needed for cough. 30 capsule 1   bisoprolol-hydrochlorothiazide (ZIAC) 5-6.25 MG tablet Take 1 tablet by mouth 2 (two) times daily. 90 tablet 2   budesonide (ENTOCORT EC) 3 MG 24 hr capsule Take 3 mg by mouth daily.     buPROPion (WELLBUTRIN XL) 300 MG 24 hr tablet Take 1 tablet daily for mood and focus. 90 tablet 1   Cyanocobalamin (B-12 SL) Place under the tongue daily.  cyclobenzaprine (FLEXERIL) 5 MG tablet TAKE 1/2 TO 1 TABLET BY MOUTH 2-3 TIMES A DAY ONLY IF NEEDED FOR MUSCLE SPASM 90 tablet 1   diclofenac sodium (VOLTAREN) 1 % GEL Apply 2 g topically 2 (two) times daily as needed.   2   DIGESTIVE ENZYMES PO Take by mouth 2 (two) times daily.     DULoxetine (CYMBALTA) 60 MG capsule TAKE 1 CAPSULE BY MOUTH 2 TIMES A DAY FOR PAIN. 180 capsule 1   fenofibrate micronized (LOFIBRA) 134 MG capsule TAKE 1 CAPSULE BY MOUTH DAILY BEFORE BREAKFAST 90 capsule 3   ferrous sulfate 325 (65 FE) MG tablet Take 325 mg by mouth daily.      fluticasone (FLONASE) 50 MCG/ACT nasal spray USE  TWO SPRAYS IN EACH NOSTRIL DAILY AS NEEDED 48 g 3   gabapentin (NEURONTIN) 300 MG capsule TAKE ONE CAPSULE 3 TIMES A DAY 270 capsule 0   levothyroxine (SYNTHROID, LEVOTHROID) 100 MCG tablet TAKE 1 TABLET BY MOUTH ON TUES, WED, THURS, SAT AND SUN. TAKE 1& 1/2 TABLET ON MON AND FRI 102 tablet 3   lidocaine (XYLOCAINE) 2 % jelly apply small amount topically to affected area 2-3 times a day (Patient not taking: Reported on 10/28/2018) 30 mL 0   meclizine (ANTIVERT) 25 MG tablet TAKE 1 TABLET (25 MG TOTAL) BY MOUTH 3 (THREE) TIMES DAILY AS NEEDED FOR DIZZINESS. 90 tablet 0   mupirocin ointment (BACTROBAN) 2 % Place 1 application into the nose 2 (two) times daily.     NEOMYCIN-POLYMYXIN-HYDROCORTISONE (CORTISPORIN) 1 % SOLN OTIC solution Place 3 drops into both ears 4 (four) times daily. 10 mL 0   nystatin (MYCOSTATIN) 100000 UNIT/ML suspension KEEP 5 ML IN MOUTH AS LONG AS POSSIBLE 4 TIMES A DAY(SWISH & SPIT)USE 48 HRS AFTER SYMPTOMS RESOLVE 360 mL 0   ondansetron (ZOFRAN ODT) 4 MG disintegrating tablet Take 1 tablet (4 mg total) by mouth every 8 (eight) hours as needed for nausea or vomiting. 20 tablet 0   oxybutynin (DITROPAN) 5 MG tablet Take 1 tablet (5 mg total) by mouth 2 (two) times daily. 180 tablet 1   phentermine (ADIPEX-P) 37.5 MG tablet Take 1/2-1 tab daily as needed for appetite and weight loss. Take regular drug breaks to avoid tolerance. 30 tablet 2   Phenylephrine-guaiFENesin (MUCUS RELIEF D PO) Take by mouth daily.     polyethylene glycol (MIRALAX / GLYCOLAX) packet Take 17 g by mouth daily as needed for mild constipation. 14 each 0   predniSONE (DELTASONE) 20 MG tablet 2 tablets daily for 3 days, 1 tablet daily for 4 days. 10 tablet 0   Probiotic Product (PROBIOTIC DAILY PO) Take by mouth daily.     rOPINIRole (REQUIP) 2 MG tablet TAKE 1 TABLET 4 TIMES A DAY FOR RESTLESS LEG SYNDROME 360 tablet 1   TRAMADOL HCL ER PO Take by mouth.     Vitamin D, Ergocalciferol,  (DRISDOL) 1.25 MG (50000 UT) CAPS capsule TAKE 1 CAPSULE BY MOUTH DAILY. 90 capsule 1   Wheat Dextrin (BENEFIBER PO) Take by mouth.     No facility-administered medications prior to visit.       Review of Systems  Review of Systems   Physical Exam  There were no vitals taken for this visit. Physical Exam   Lab Results:  CBC    Component Value Date/Time   WBC 9.1 06/20/2018 1438   RBC 4.72 06/20/2018 1438   HGB 13.4 06/20/2018 1438   HGB 12.9 10/20/2012 1358  HCT 43.5 06/20/2018 1438   HCT 40.2 10/20/2012 1358   PLT 226 06/20/2018 1438   PLT 265 10/20/2012 1358   MCV 92.2 06/20/2018 1438   MCV 82.1 10/20/2012 1358   MCH 28.4 06/20/2018 1438   MCHC 30.8 06/20/2018 1438   RDW 14.8 06/20/2018 1438   RDW 16.9 (H) 10/20/2012 1358   LYMPHSABS 1.6 06/20/2018 1438   LYMPHSABS 1.3 10/20/2012 1358   MONOABS 0.7 06/20/2018 1438   MONOABS 0.6 10/20/2012 1358   EOSABS 0.1 06/20/2018 1438   EOSABS 0.1 10/20/2012 1358   BASOSABS 0.1 06/20/2018 1438   BASOSABS 0.1 10/20/2012 1358    BMET    Component Value Date/Time   NA 141 06/20/2018 1438   NA 142 03/28/2017   NA 141 10/20/2012 1358   K 3.8 06/20/2018 1438   K 3.9 10/20/2012 1358   CL 105 06/20/2018 1438   CL 104 10/20/2012 1358   CO2 25 06/20/2018 1438   CO2 27 10/20/2012 1358   GLUCOSE 88 06/20/2018 1438   GLUCOSE 109 (H) 10/20/2012 1358   BUN 15 06/20/2018 1438   BUN 14 03/28/2017   BUN 15.6 10/20/2012 1358   CREATININE 0.79 06/20/2018 1438   CREATININE 0.81 04/28/2018 1248   CREATININE 1.1 10/20/2012 1358   CALCIUM 8.4 (L) 06/20/2018 1438   CALCIUM 7.8 (L) 03/26/2017 0354   CALCIUM 9.7 10/20/2012 1358   GFRNONAA >60 06/20/2018 1438   GFRNONAA 74 04/28/2018 1248   GFRAA >60 06/20/2018 1438   GFRAA 86 04/28/2018 1248    BNP    Component Value Date/Time   BNP 31.8 06/24/2015 2109    ProBNP No results found for: PROBNP  Imaging: Vas [place] Lower Extremity Venous (dvt)  Result Date: 11/03/2018   Lower Venous Study Other Indications: Patient presents with pain and swelling around the right                    knee. She reports she had a large Baker's cyst noted on a RLE                    venous duplex in 12/19 that burst and drained into her calf.                    She is having similar symptoms today and wants her leg                    checked out before seeing a new knee specialist. Risk Factors: None identified. Comparison Study: Prior RLE venous duplex performed at [facility] in 12/19                   showed no evidence of DVT in the right lower extremity. A                   cystic structure measuring 2.6 x 5.4 cm found in the popliteal                   fossa. Performing Technologist: Mariane Masters RVT  Examination Guidelines: A complete evaluation includes B-mode imaging, spectral Doppler, color Doppler, and power Doppler as needed of all accessible portions of each vessel. Bilateral testing is considered an integral part of a complete examination. Limited examinations for reoccurring indications may be performed as noted.  +---------+---------------+---------+-----------+----------+-------+  RIGHT     Compressibility Phasicity Spontaneity Properties Summary  +---------+---------------+---------+-----------+----------+-------+  CFV  Full            Yes       Yes                             +---------+---------------+---------+-----------+----------+-------+  SFJ       Full            Yes       Yes                             +---------+---------------+---------+-----------+----------+-------+  FV Prox   Full            Yes       Yes                             +---------+---------------+---------+-----------+----------+-------+  FV Mid    Full            Yes       Yes                             +---------+---------------+---------+-----------+----------+-------+  FV Distal Full            Yes       Yes                              +---------+---------------+---------+-----------+----------+-------+  PFV       Full                                                      +---------+---------------+---------+-----------+----------+-------+  POP       Full            Yes       Yes                             +---------+---------------+---------+-----------+----------+-------+  PTV       Full            Yes       Yes                             +---------+---------------+---------+-----------+----------+-------+  PERO      Full            Yes       Yes                             +---------+---------------+---------+-----------+----------+-------+  Gastroc   Full                                                      +---------+---------------+---------+-----------+----------+-------+  GSV       Full            Yes       Yes                             +---------+---------------+---------+-----------+----------+-------+  Avascular, complex cystic structure visualized in the right popliteal fossa and extending into the proximal posterior calf. Measures 4.7 x 1.2 cm in the calf and 2.4 x 2.0 in the popliteal fossa.  +----+---------------+---------+-----------+----------+-------+  LEFT Compressibility Phasicity Spontaneity Properties Summary  +----+---------------+---------+-----------+----------+-------+  CFV  Full            Yes       Yes                             +----+---------------+---------+-----------+----------+-------+  Attempted to report findings to [name] at 4:30 pm, however the office had closed. Staff message sent to provider regarding preliminary findings.  Summary: Right: No evidence of deep vein thrombosis in the lower extremity. No indirect evidence of obstruction proximal to the inguinal ligament. A complex cystic structure is found in the popliteal fossa extending into the proximal posterior calf. Note on images cystic structure labelled as L ? left although imaging is on right presumed Left: No evidence of common femoral  vein obstruction.  *See table(s) above for measurements and observations. Electronically signed by Jenkins Rouge MD on 11/03/2018 at 2:17:36 PM.    Final      Assessment & Plan:   No problem-specific Assessment & Plan notes found for this encounter.     Martyn Ehrich, NP 11/19/2018

## 2018-11-23 ENCOUNTER — Other Ambulatory Visit: Payer: Self-pay

## 2018-11-23 ENCOUNTER — Encounter: Payer: Self-pay | Admitting: Adult Health Nurse Practitioner

## 2018-11-23 ENCOUNTER — Ambulatory Visit: Payer: Medicare Other | Admitting: Adult Health Nurse Practitioner

## 2018-11-23 DIAGNOSIS — Z96611 Presence of right artificial shoulder joint: Secondary | ICD-10-CM | POA: Diagnosis not present

## 2018-11-23 DIAGNOSIS — K219 Gastro-esophageal reflux disease without esophagitis: Secondary | ICD-10-CM

## 2018-11-23 DIAGNOSIS — R05 Cough: Secondary | ICD-10-CM

## 2018-11-23 DIAGNOSIS — E1169 Type 2 diabetes mellitus with other specified complication: Secondary | ICD-10-CM | POA: Diagnosis not present

## 2018-11-23 DIAGNOSIS — R059 Cough, unspecified: Secondary | ICD-10-CM

## 2018-11-23 DIAGNOSIS — I10 Essential (primary) hypertension: Secondary | ICD-10-CM

## 2018-11-23 DIAGNOSIS — E039 Hypothyroidism, unspecified: Secondary | ICD-10-CM

## 2018-11-23 DIAGNOSIS — Z79899 Other long term (current) drug therapy: Secondary | ICD-10-CM

## 2018-11-23 DIAGNOSIS — E785 Hyperlipidemia, unspecified: Secondary | ICD-10-CM

## 2018-11-23 NOTE — Patient Instructions (Addendum)
Use your incentive spirometer 10times an hour This is helping to prevent pneumonia  Increase your water intake  Start walking every day, even a small distance and increase this daily.    How To Use an Incentive Spirometer An incentive spirometer is a tool that measures how well you are filling your lungs with each breath. Learning to take long, deep breaths using this tool can help you keep your lungs clear and active. This may help to reverse or lessen your chance of developing breathing (pulmonary) problems, especially infection. You may be asked to use a spirometer:  After a surgery.  If you have a lung problem or a history of smoking.  After a long period of time when you have been unable to move or be active. If the spirometer includes an indicator to show the highest number that you have reached, your health care provider or respiratory therapist will help you set a goal. Keep a list (log) of your progress as told by your health care provider. What are the risks?  Breathing too quickly may cause dizziness or cause you to pass out. Take your time so you do not get dizzy or light-headed.  If you are in pain, you may need to take pain medicine before doing incentive spirometry. It is harder to take a deep breath if you are having pain. How to use your incentive spirometer  1. Sit up on the edge of your bed or on a chair. 2. Hold the incentive spirometer so that it is in an upright position. 3. Before you use the spirometer, breathe out normally. 4. Place the mouthpiece in your mouth. Make sure your lips are closed tightly around it. 5. Breathe in slowly and as deeply as you can through your mouth, causing the piston or the ball to rise toward the top of the chamber. 6. Hold your breath for 3-5 seconds, or for as long as possible. ? If the spirometer includes a coach indicator, use this to guide you in breathing. Slow down your breathing if the indicator goes above the marked  areas. 7. Remove the mouthpiece from your mouth and breathe out normally. The piston or ball will return to the bottom of the chamber. 8. Rest for a few seconds, then repeat the steps 10 or more times. ? Take your time and take a few normal breaths between deep breaths so that you do not get dizzy or light-headed. ? Do this every 1-2 hours when you are awake. 9. If the spirometer includes a goal marker to show the highest number you have reached (best effort), use this as a goal to work toward during each repetition. 10. After each set of 10 deep breaths, cough a few times. This will help to make sure that your lungs are clear. ? If you have an incision on your chest or abdomen from surgery, place a pillow or a rolled-up towel firmly against the incision when you cough. This can help to reduce pain from coughing. General tips  When you become able to get out of bed, walk around often and continue to cough to help clear your lungs.  Keep using the incentive spirometer until your health care provider says it is okay to stop using it. If you have been in the hospital, you may be told to keep using the spirometer at home. Contact a health care provider if:  You are having difficulty using the spirometer.  You have trouble using the spirometer as often as  instructed.  Your pain medicine is not giving enough relief for you to use the spirometer as told.  You have a fever.  You develop shortness of breath. Get help right away if:  You develop a cough with bloody mucus from the lungs (bloody sputum).  You have fluid or blood coming from an incision site after you cough. Summary  An incentive spirometer is a tool that can help you learn to take long, deep breaths to keep your lungs clear and active.  You may be asked to use a spirometer after a surgery, if you have a lung problem or a history of smoking, or if you have been inactive for a long period of time.  Use your incentive spirometer  as instructed every 1-2 hours while you are awake.  If you have an incision on your chest or abdomen, place a pillow or a rolled-up towel firmly against your incision when you cough. This will help to reduce pain. This information is not intended to replace advice given to you by your health care provider. Make sure you discuss any questions you have with your health care provider. Document Released: 10/27/2006 Document Revised: 07/09/2017 Document Reviewed: 04/29/2017 Elsevier Interactive Patient Education  2019 [employer]   Follow up with Pulmonology Follow up with Orthopedics We will call to schedule appointment for follow up after these.  Hospital precautions: Extreme shortness of breath, fever, chest pain.

## 2018-11-23 NOTE — Progress Notes (Signed)
I connected with  Crystal [name] on 11/23/18 by telephone and verified that I am speaking with the correct person using two identifiers.   I discussed the limitations of evaluation and management by telemedicine. The patient expressed understanding and agreed to proceed.   Hospital follow up  Assessment and Plan: Hospital visit follow up for Right rotator cuff tear, atrophy.  She had repair of biceps long tendon and reconstruction total shoulder implant. Will follow up to check labs if no labs are drawn from pulmonology visit.  Has follow up with surgeon 11/29/18.  Diagnoses and all orders for this visit:  Status post replacement of right shoulder joint Pain is managed, concern for amount of medication, discussed with patient. This is being managed by Orthopedics. Reinforced/educated discharge instructions Discussed incision care Use IS 10x an hour! INCREASE WALKING!!! Discussed these measures at length  Cough Has appointment with Pulomonology in one day No acute distress today Discussed S&S to seek further care  Gastroesophageal reflux disease, esophagitis presence not specified Doing well at this time Continue current regiment  Type 2 diabetes mellitus with hyperlipidemia (HCC) Doing well at this time Will check labs next office visit  Essential hypertension Continue to monitor  Hypothyroidism, unspecified type Doing well Will check labs next office visit  Polypharmacy Concern for amount of pain medication and not enough activity 7 days post-op.      All medications were reviewed with patient and family and fully reconciled. All questions answered fully, and patient and family members were encouraged to call the office with any further questions or concerns. Discussed goal to avoid readmission related to this diagnosis.  There are no discontinued medications.   Over 40 minutes of interview, counseling, chart review, and complex, high/moderate level critical  decision making was performed this visit.   Future Appointments  Date Time Provider Department Center  11/23/2018  1:30 PM Crystal [name], Crystal Mulvaney, NP [facility] None  11/24/2018  2:30 PM Crystal [name], Crystal W, NP [facility] None  10/25/2019 11:15 AM Crystal [name], Ashley, NP [facility] None     HPI 68 y.o.female presents for follow up for transition from recent hospitalization stay. Admit date to the hospital was 11/15/18, patient was discharged from the hospital on 11/16/18 and our clinical staff contacted the office the day after discharge to set up a follow up appointment. The discharge summary, medications, and diagnostic test results were reviewed before meeting with the patient. The patient was admitted for: Repair of bicepts long tendon and reconstruction total shoulder implant.  She had some desaturation in the hospital with shortness of breath and cough.  11/12/18 COVID19 was negative and surgery 11/16/18 with discharge 11/16/18.  Contact made with our office 11/17/18 regarding persistent cough.  Treatment with zpak, prednisone and benzonate prn.  Treatment was completed and some improvement although she still has a cough with some shortness of breath.  She has not been using her incentive spirometer as instructed.  She also reports she has not been doing any walking, outside and minimal walking around the home.  Patient has some word slurring, which is present at baseline, exacerbated by pain medications? She is taking oxycontin 10mg  three times a day.  Discussed medication prescribed was for every 12 hours.  She also had nerve block in place which  Reports they may not have enough to get though the weekend.   She is not short of breath with conversation and reports she has improved some since completing ABX & prednisone treatment.  On 11/18/18 contacted pulmonology  for appointment, X-ray has been ordered for 11/24/18 and office visit after.  Concern that pain medications are contributing to shortness of breath?  Although had previous evaluation for this prior to surgery.  Patient has not removed bandage from incision.  This was to be done after four days per discharge instructions.  Reviewed these instructions with patient and husband on phone. Discussed referring to this and will place these on paperwork received today.  Discussed incision are and management as well as S&S of infection.  Reports her sling is not on properly and they were headed to stop by therapy to have this adjusted properly.   Home health is not involved, outpatient therapy will begin after resolution of shortness of breath and x-ray.  Had a nerve block, stopped yesterday Pain controlled: White oxycotin three times a day Use x10 times an hour,   Sennakot and miralax, benfiber to prevent constipation Walking, no  Images while in the hospital: No results found.  ASA 81mg  BID Follow up with Crystal [name] 11/29/18 and also 12/28/18.  Current Outpatient Medications (Endocrine & Metabolic):  .  budesonide (ENTOCORT EC) 3 MG 24 hr capsule, Take 3 mg by mouth daily. [name]  levothyroxine (SYNTHROID, LEVOTHROID) 100 MCG tablet, TAKE 1 TABLET BY MOUTH ON TUES, WED, THURS, SAT AND SUN. TAKE 1& 1/2 TABLET ON MON AND FRI .  predniSONE (DELTASONE) 20 MG tablet, 2 tablets daily for 3 days, 1 tablet daily for 4 days.  Current Outpatient Medications (Cardiovascular):  .  bisoprolol-hydrochlorothiazide (ZIAC) 5-6.25 MG tablet, Take 1 tablet by mouth 2 (two) times daily. .  fenofibrate micronized (LOFIBRA) 134 MG capsule, TAKE 1 CAPSULE BY MOUTH DAILY BEFORE BREAKFAST  Current Outpatient Medications (Respiratory):  .  benzonatate (TESSALON) 200 MG capsule, Take 1 cap three times daily as needed for cough. .  fluticasone (FLONASE) 50 MCG/ACT nasal spray, USE TWO SPRAYS IN EACH NOSTRIL DAILY AS NEEDED .  Phenylephrine-guaiFENesin (MUCUS RELIEF D PO), Take by mouth daily.  Current Outpatient Medications (Analgesics):  .  aspirin EC 81 MG tablet, Take 81  mg by mouth daily. .  TRAMADOL HCL ER PO, Take by mouth.  Current Outpatient Medications (Hematological):  [name]  Cyanocobalamin (B-12 SL), Place under the tongue daily. .  ferrous sulfate 325 (65 FE) MG tablet, Take 325 mg by mouth daily.   Current Outpatient Medications (Other):  [name]  buPROPion (WELLBUTRIN XL) 300 MG 24 hr tablet, Take 1 tablet daily for mood and focus. .  cyclobenzaprine (FLEXERIL) 5 MG tablet, TAKE 1/2 TO 1 TABLET BY MOUTH 2-3 TIMES A DAY ONLY IF NEEDED FOR MUSCLE SPASM .  diclofenac sodium (VOLTAREN) 1 % GEL, Apply 2 g topically 2 (two) times daily as needed.  [name]  DIGESTIVE ENZYMES PO, Take by mouth 2 (two) times daily. .  DULoxetine (CYMBALTA) 60 MG capsule, TAKE 1 CAPSULE BY MOUTH 2 TIMES A DAY FOR PAIN. [name]  gabapentin (NEURONTIN) 300 MG capsule, TAKE ONE CAPSULE 3 TIMES A DAY .  lidocaine (XYLOCAINE) 2 % jelly, apply small amount topically to affected area 2-3 times a day (Patient not taking: Reported on 10/28/2018) .  meclizine (ANTIVERT) 25 MG tablet, TAKE 1 TABLET (25 MG TOTAL) BY MOUTH 3 (THREE) TIMES DAILY AS NEEDED FOR DIZZINESS. .  mupirocin ointment (BACTROBAN) 2 %, Place 1 application into the nose 2 (two) times daily. .  NEOMYCIN-POLYMYXIN-HYDROCORTISONE (CORTISPORIN) 1 % SOLN OTIC solution, Place 3 drops into both ears 4 (four) times daily. [name]  nystatin (MYCOSTATIN) 100000  UNIT/ML suspension, KEEP 5 ML IN MOUTH AS LONG AS POSSIBLE 4 TIMES A DAY(SWISH & SPIT)USE 48 HRS AFTER SYMPTOMS RESOLVE .  ondansetron (ZOFRAN ODT) 4 MG disintegrating tablet, Take 1 tablet (4 mg total) by mouth every 8 (eight) hours as needed for nausea or vomiting. [name]  oxybutynin (DITROPAN) 5 MG tablet, Take 1 tablet (5 mg total) by mouth 2 (two) times daily. .  phentermine (ADIPEX-P) 37.5 MG tablet, Take 1/2-1 tab daily as needed for appetite and weight loss. Take regular drug breaks to avoid tolerance. .  polyethylene glycol (MIRALAX / GLYCOLAX) packet, Take 17 g by mouth daily as needed for mild  constipation. .  Probiotic Product (PROBIOTIC DAILY PO), Take by mouth daily. [name]  rOPINIRole (REQUIP) 2 MG tablet, TAKE 1 TABLET 4 TIMES A DAY FOR RESTLESS LEG SYNDROME .  Vitamin D, Ergocalciferol, (DRISDOL) 1.25 MG (50000 UT) CAPS capsule, TAKE 1 CAPSULE BY MOUTH DAILY. [name]  Wheat Dextrin (BENEFIBER PO), Take by mouth.  Past Medical History:  Diagnosis Date  . Arthritis   . ASCVD (arteriosclerotic cardiovascular disease)   . Closed nondisplaced subtrochanteric fracture of left femur (HCC) 09/03/2017   September 2018.  Status post surgery by Dr. Carola Frost  . Depression   . Fibromyalgia   . Gait disorder 10/26/2012  . GERD (gastroesophageal reflux disease)   . GI bleed   . Hyperlipidemia   . Hypertension   . Hypothyroid   . Periprosthetic supracondylar fracture of femur, left  03/23/2017  . Restless leg syndrome   . Rhinitis 06/25/2010  . Sleep apnea   . TIA (transient ischemic attack)    3        . Vitamin D deficiency      Allergies  Allergen Reactions  . Atorvastatin Other (See Comments)    Extreme pain  . Codeine Other (See Comments)    Headache  . Pravastatin Other (See Comments)    Exreme pain  . Statins Other (See Comments)    Extreme pain  . Amitriptyline Other (See Comments)    Unspecified  . Fish Oil Nausea Only  . Linseed Oil Other (See Comments)    Other Reaction: Other reaction  . Nuvigil [Armodafinil] Other (See Comments)    Unspecified  . Sulfa Antibiotics Other (See Comments)  . Erythromycin Other (See Comments)    Reaction unspecified  . Erythromycin Base Rash  . Flax Seed [Bio-Flax] Rash    Linseed  . Hydrocodone-Acetaminophen Rash  . Morphine Nausea And Vomiting and Rash  . Nsaids Nausea Only  . Sertraline Rash  . Sinemet [Carbidopa W-Levodopa] Rash  . Sulfamethoxazole Rash    ROS: all negative except above.   Physical Exam:  General : Well sounding patient in no apparent distress HEENT: no hoarseness, no cough for duration of visit Lungs:  speaks in complete sentences, no audible wheezing, no apparent distress Neurological: alert, oriented x 3 Psychiatric: pleasant, judgement appropriate     Crystal Negus, NP 1:23 PM [facility]

## 2018-11-24 ENCOUNTER — Ambulatory Visit (INDEPENDENT_AMBULATORY_CARE_PROVIDER_SITE_OTHER): Payer: Medicare Other

## 2018-11-24 ENCOUNTER — Encounter: Payer: Self-pay | Admitting: Primary Care

## 2018-11-24 ENCOUNTER — Ambulatory Visit: Payer: Medicare Other | Admitting: Primary Care

## 2018-11-24 VITALS — BP 124/86 | HR 77 | Temp 97.5°F | Ht 64.0 in | Wt 178.2 lb

## 2018-11-24 DIAGNOSIS — R05 Cough: Secondary | ICD-10-CM | POA: Diagnosis not present

## 2018-11-24 DIAGNOSIS — J471 Bronchiectasis with (acute) exacerbation: Secondary | ICD-10-CM | POA: Diagnosis not present

## 2018-11-24 DIAGNOSIS — R0602 Shortness of breath: Secondary | ICD-10-CM | POA: Diagnosis not present

## 2018-11-24 DIAGNOSIS — G4733 Obstructive sleep apnea (adult) (pediatric): Secondary | ICD-10-CM | POA: Diagnosis not present

## 2018-11-24 DIAGNOSIS — J40 Bronchitis, not specified as acute or chronic: Secondary | ICD-10-CM | POA: Diagnosis not present

## 2018-11-24 DIAGNOSIS — J479 Bronchiectasis, uncomplicated: Secondary | ICD-10-CM

## 2018-11-24 MED ORDER — FLUTTER DEVI: 0 refills | Status: DC

## 2018-11-24 MED ORDER — BENZONATATE 200 MG PO CAPS: ORAL_CAPSULE | ORAL | 1 refills | Status: DC

## 2018-11-24 NOTE — Progress Notes (Signed)
@Patient  ID: Crystal [name], female    DOB: 07-May-1951, 68 y.o.   MRN: [id number]  Chief Complaint  Patient presents with   Follow-up    dry cough, low O2,     Referring provider: Unk Pinto, MD  HPI: 68 year old female, never smoked. PMH significant for bronchiectasis, OSA, rhinitis. Patient of Dr. Halford Chessman, last seen on 07/26/18.   11/24/2018 Patient presents today for acute visit with complaints of sob, dry cough. She was told she had low O2 sats during shoulder surgery at [facility]. O2 sat 91-98% today in office. CXR showed mild elevation of the right hemidiaphragm and mildly prominent interstitial lung markings which are stable. Last time she used her cpap was 1-2 months ago.    Allergies  Allergen Reactions   Atorvastatin Other (See Comments)    Extreme pain   Codeine Other (See Comments)    Headache   Pravastatin Other (See Comments)    Exreme pain   Statins Other (See Comments)    Extreme pain   Amitriptyline Other (See Comments)    Unspecified   Fish Oil Nausea Only   Linseed Oil Other (See Comments)    Other Reaction: Other reaction   Nuvigil [Armodafinil] Other (See Comments)    Unspecified   Sulfa Antibiotics Other (See Comments)   Erythromycin Other (See Comments)    Reaction unspecified   Erythromycin Base Rash   Flax Seed [Bio-Flax] Rash    Linseed   Hydrocodone-Acetaminophen Rash   Morphine Nausea And Vomiting and Rash   Nsaids Nausea Only   Sertraline Rash   Sinemet [Carbidopa W-Levodopa] Rash   Sulfamethoxazole Rash    Immunization History  Administered Date(s) Administered   DT 01/03/2015   Influenza Split 04/16/2018   Influenza Whole 03/30/2010   Influenza, High Dose Seasonal PF 03/13/2015   Influenza-Unspecified 03/31/2011, 05/14/2014, 02/27/2016   Pneumococcal Conjugate-13 04/17/2015   Pneumococcal Polysaccharide-23 01/06/2017   Pneumococcal-Unspecified 06/30/2005   Td 06/30/2004   Zoster 07/01/2007     Past Medical History:  Diagnosis Date   Arthritis    ASCVD (arteriosclerotic cardiovascular disease)    Closed nondisplaced subtrochanteric fracture of left femur ([place]) 09/03/2017   September 2018.  Status post surgery by Dr. Marcelino Scot   Depression    Fibromyalgia    Gait disorder 10/26/2012   GERD (gastroesophageal reflux disease)    GI bleed    Hyperlipidemia    Hypertension    Hypothyroid    Periprosthetic supracondylar fracture of femur, left  03/23/2017   Restless leg syndrome    Rhinitis 06/25/2010   Sleep apnea    TIA (transient ischemic attack)    3         Vitamin D deficiency     Tobacco History: Social History   Tobacco Use  Smoking Status Never Smoker  Smokeless Tobacco Never Used   Counseling given: Not Answered   Outpatient Medications Prior to Visit  Medication Sig Dispense Refill   aspirin EC 81 MG tablet Take 81 mg by mouth daily.     bisoprolol-hydrochlorothiazide (ZIAC) 5-6.25 MG tablet Take 1 tablet by mouth 2 (two) times daily. 90 tablet 2   buPROPion (WELLBUTRIN XL) 300 MG 24 hr tablet Take 1 tablet daily for mood and focus. 90 tablet 1   Cyanocobalamin (B-12 SL) Place under the tongue daily.     cyclobenzaprine (FLEXERIL) 5 MG tablet TAKE 1/2 TO 1 TABLET BY MOUTH 2-3 TIMES A DAY ONLY IF NEEDED FOR MUSCLE SPASM 90 tablet  1   diclofenac sodium (VOLTAREN) 1 % GEL Apply 2 g topically 2 (two) times daily as needed.   2   DIGESTIVE ENZYMES PO Take by mouth 2 (two) times daily.     DULoxetine (CYMBALTA) 60 MG capsule TAKE 1 CAPSULE BY MOUTH 2 TIMES A DAY FOR PAIN. (Patient taking differently: 60 mg daily. ) 180 capsule 1   fenofibrate micronized (LOFIBRA) 134 MG capsule TAKE 1 CAPSULE BY MOUTH DAILY BEFORE BREAKFAST 90 capsule 3   ferrous sulfate 325 (65 FE) MG tablet Take 325 mg by mouth daily.      gabapentin (NEURONTIN) 300 MG capsule TAKE ONE CAPSULE 3 TIMES A DAY 270 capsule 0   levothyroxine (SYNTHROID, LEVOTHROID) 100 MCG  tablet TAKE 1 TABLET BY MOUTH ON TUES, WED, THURS, SAT AND SUN. TAKE 1& 1/2 TABLET ON MON AND FRI 102 tablet 3   lidocaine (XYLOCAINE) 2 % jelly apply small amount topically to affected area 2-3 times a day 30 mL 0   meclizine (ANTIVERT) 25 MG tablet TAKE 1 TABLET (25 MG TOTAL) BY MOUTH 3 (THREE) TIMES DAILY AS NEEDED FOR DIZZINESS. 90 tablet 0   ondansetron (ZOFRAN ODT) 4 MG disintegrating tablet Take 1 tablet (4 mg total) by mouth every 8 (eight) hours as needed for nausea or vomiting. 20 tablet 0   oxybutynin (DITROPAN) 5 MG tablet Take 1 tablet (5 mg total) by mouth 2 (two) times daily. 180 tablet 1   polyethylene glycol (MIRALAX / GLYCOLAX) packet Take 17 g by mouth daily as needed for mild constipation. 14 each 0   Probiotic Product (PROBIOTIC DAILY PO) Take by mouth daily.     rOPINIRole (REQUIP) 2 MG tablet TAKE 1 TABLET 4 TIMES A DAY FOR RESTLESS LEG SYNDROME 360 tablet 1   Vitamin D, Ergocalciferol, (DRISDOL) 1.25 MG (50000 UT) CAPS capsule TAKE 1 CAPSULE BY MOUTH DAILY. 90 capsule 1   Wheat Dextrin (BENEFIBER PO) Take by mouth.     predniSONE (DELTASONE) 20 MG tablet 2 tablets daily for 3 days, 1 tablet daily for 4 days. 10 tablet 0   budesonide (ENTOCORT EC) 3 MG 24 hr capsule Take 3 mg by mouth daily.     fluticasone (FLONASE) 50 MCG/ACT nasal spray USE TWO SPRAYS IN EACH NOSTRIL DAILY AS NEEDED (Patient not taking: Reported on 11/24/2018) 48 g 3   mupirocin ointment (BACTROBAN) 2 % Place 1 application into the nose 2 (two) times daily.     NEOMYCIN-POLYMYXIN-HYDROCORTISONE (CORTISPORIN) 1 % SOLN OTIC solution Place 3 drops into both ears 4 (four) times daily. (Patient not taking: Reported on 11/24/2018) 10 mL 0   nystatin (MYCOSTATIN) 100000 UNIT/ML suspension KEEP 5 ML IN MOUTH AS LONG AS POSSIBLE 4 TIMES A DAY(SWISH & SPIT)USE 48 HRS AFTER SYMPTOMS RESOLVE (Patient not taking: Reported on 11/24/2018) 360 mL 0   phentermine (ADIPEX-P) 37.5 MG tablet Take 1/2-1 tab daily as  needed for appetite and weight loss. Take regular drug breaks to avoid tolerance. (Patient not taking: Reported on 11/24/2018) 30 tablet 2   Phenylephrine-guaiFENesin (MUCUS RELIEF D PO) Take by mouth daily.     TRAMADOL HCL ER PO Take by mouth.     benzonatate (TESSALON) 200 MG capsule Take 1 cap three times daily as needed for cough. (Patient not taking: Reported on 11/24/2018) 30 capsule 1   No facility-administered medications prior to visit.     Review of Systems  Review of Systems  Constitutional: Negative.   Respiratory: Positive for cough and  shortness of breath. Negative for wheezing.   Cardiovascular: Negative.    Physical Exam  BP 124/86 (BP Location: Left Arm, Cuff Size: Normal)    Pulse 77    Temp (!) 97.5 F (36.4 C)    Ht 5' 4"  (1.626 m)    Wt 178 lb 3.2 oz (80.8 kg)    SpO2 98%    BMI 30.59 kg/m  Physical Exam Constitutional:      Appearance: Normal appearance.  HENT:     Head: Normocephalic and atraumatic.     Mouth/Throat:     Mouth: Mucous membranes are moist.     Pharynx: Oropharynx is clear.  Eyes:     Conjunctiva/sclera: Conjunctivae normal.     Pupils: Pupils are equal, round, and reactive to light.  Neck:     Musculoskeletal: Normal range of motion and neck supple.  Cardiovascular:     Rate and Rhythm: Normal rate and regular rhythm.  Pulmonary:     Effort: Pulmonary effort is normal. No respiratory distress.     Breath sounds: No wheezing.     Comments: CTA, dry cough through out  Musculoskeletal: Normal range of motion.  Skin:    General: Skin is warm and dry.  Neurological:     General: No focal deficit present.     Mental Status: She is alert and oriented to person, place, and time. Mental status is at baseline.  Psychiatric:        Mood and Affect: Mood normal.        Behavior: Behavior normal.        Thought Content: Thought content normal.        Judgment: Judgment normal.      Lab Results:  CBC    Component Value Date/Time    WBC 9.1 06/20/2018 1438   RBC 4.72 06/20/2018 1438   HGB 13.4 06/20/2018 1438   HGB 12.9 10/20/2012 1358   HCT 43.5 06/20/2018 1438   HCT 40.2 10/20/2012 1358   PLT 226 06/20/2018 1438   PLT 265 10/20/2012 1358   MCV 92.2 06/20/2018 1438   MCV 82.1 10/20/2012 1358   MCH 28.4 06/20/2018 1438   MCHC 30.8 06/20/2018 1438   RDW 14.8 06/20/2018 1438   RDW 16.9 (H) 10/20/2012 1358   LYMPHSABS 1.6 06/20/2018 1438   LYMPHSABS 1.3 10/20/2012 1358   MONOABS 0.7 06/20/2018 1438   MONOABS 0.6 10/20/2012 1358   EOSABS 0.1 06/20/2018 1438   EOSABS 0.1 10/20/2012 1358   BASOSABS 0.1 06/20/2018 1438   BASOSABS 0.1 10/20/2012 1358    BMET    Component Value Date/Time   NA 141 06/20/2018 1438   NA 142 03/28/2017   NA 141 10/20/2012 1358   K 3.8 06/20/2018 1438   K 3.9 10/20/2012 1358   CL 105 06/20/2018 1438   CL 104 10/20/2012 1358   CO2 25 06/20/2018 1438   CO2 27 10/20/2012 1358   GLUCOSE 88 06/20/2018 1438   GLUCOSE 109 (H) 10/20/2012 1358   BUN 15 06/20/2018 1438   BUN 14 03/28/2017   BUN 15.6 10/20/2012 1358   CREATININE 0.79 06/20/2018 1438   CREATININE 0.81 04/28/2018 1248   CREATININE 1.1 10/20/2012 1358   CALCIUM 8.4 (L) 06/20/2018 1438   CALCIUM 7.8 (L) 03/26/2017 0354   CALCIUM 9.7 10/20/2012 1358   GFRNONAA >60 06/20/2018 1438   GFRNONAA 74 04/28/2018 1248   GFRAA >60 06/20/2018 1438   GFRAA 86 04/28/2018 1248    BNP  Component Value Date/Time   BNP 31.8 06/24/2015 2109    ProBNP No results found for: PROBNP  Imaging: Dg Chest 2 View  Result Date: 11/24/2018 CLINICAL DATA:  Shortness of breath post surgery. EXAM: CHEST - 2 VIEW COMPARISON:  07/26/2018 FINDINGS: The patient is status post total shoulder arthroplasty on the right. There is no pneumothorax. No large pleural effusion. Mild elevation of the right hemidiaphragm is noted. Mildly prominent interstitial lung markings are noted which is stable across prior studies. There are degenerative changes of  the left shoulder. The patient is status post prior ACDF. IMPRESSION: No active cardiopulmonary disease. Electronically Signed   By: Constance Holster M.D.   On: 11/24/2018 14:44   Vas [place] Lower Extremity Venous (dvt)  Result Date: 11/03/2018  Lower Venous Study Other Indications: Patient presents with pain and swelling around the right                    knee. She reports she had a large Baker's cyst noted on a RLE                    venous duplex in 12/19 that burst and drained into her calf.                    She is having similar symptoms today and wants her leg                    checked out before seeing a new knee specialist. Risk Factors: None identified. Comparison Study: Prior RLE venous duplex performed at [facility] in 12/19                   showed no evidence of DVT in the right lower extremity. A                   cystic structure measuring 2.6 x 5.4 cm found in the popliteal                   fossa. Performing Technologist: Mariane Masters RVT  Examination Guidelines: A complete evaluation includes B-mode imaging, spectral Doppler, color Doppler, and power Doppler as needed of all accessible portions of each vessel. Bilateral testing is considered an integral part of a complete examination. Limited examinations for reoccurring indications may be performed as noted.  +---------+---------------+---------+-----------+----------+-------+  RIGHT     Compressibility Phasicity Spontaneity Properties Summary  +---------+---------------+---------+-----------+----------+-------+  CFV       Full            Yes       Yes                             +---------+---------------+---------+-----------+----------+-------+  SFJ       Full            Yes       Yes                             +---------+---------------+---------+-----------+----------+-------+  FV Prox   Full            Yes       Yes                             +---------+---------------+---------+-----------+----------+-------+  FV Mid    Full  Yes       Yes                             +---------+---------------+---------+-----------+----------+-------+  FV Distal Full            Yes       Yes                             +---------+---------------+---------+-----------+----------+-------+  PFV       Full                                                      +---------+---------------+---------+-----------+----------+-------+  POP       Full            Yes       Yes                             +---------+---------------+---------+-----------+----------+-------+  PTV       Full            Yes       Yes                             +---------+---------------+---------+-----------+----------+-------+  PERO      Full            Yes       Yes                             +---------+---------------+---------+-----------+----------+-------+  Gastroc   Full                                                      +---------+---------------+---------+-----------+----------+-------+  GSV       Full            Yes       Yes                             +---------+---------------+---------+-----------+----------+-------+ Avascular, complex cystic structure visualized in the right popliteal fossa and extending into the proximal posterior calf. Measures 4.7 x 1.2 cm in the calf and 2.4 x 2.0 in the popliteal fossa.  +----+---------------+---------+-----------+----------+-------+  LEFT Compressibility Phasicity Spontaneity Properties Summary  +----+---------------+---------+-----------+----------+-------+  CFV  Full            Yes       Yes                             +----+---------------+---------+-----------+----------+-------+  Attempted to report findings to [name] at 4:30 pm, however the office had closed. Staff message sent to provider regarding preliminary findings.  Summary: Right: No evidence of deep vein thrombosis in the lower extremity. No indirect evidence of obstruction proximal to the inguinal ligament. A complex cystic structure is found in the  popliteal fossa extending into the proximal posterior calf. Note on  images cystic structure labelled as L ? left although imaging is on right presumed Left: No evidence of common femoral vein obstruction.  *See table(s) above for measurements and observations. Electronically signed by Jenkins Rouge MD on 11/03/2018 at 2:17:36 PM.    Final      Assessment & Plan:   Bronchiectasis without complication ([place]) - Reports persistent dry cough and low O2 sat - O2 today 91-98% RA - CXR showed mild elevation of the right hemidiaphragm is noted. Mildly prominent interstitial lung markings are noted which is stable across prior studies - Check HRCT  - Encourage flutter device 2-3 times a day - RX tesslone perles TID - Recommend flonase nasal spray daily   Obstructive sleep apnea - Not wearing CPAP for the last 1-2 months which could have contributed to low O2 saturation during shoulder surgery - Recommend patient resume wearing CPAP mach every night for 4-6 hours - Needs new cleaner for device, gave recommendations - FU in 3 months   Martyn Ehrich, NP 11/25/2018

## 2018-11-24 NOTE — Patient Instructions (Addendum)
CXR showed no active cardiopulmonary disease   Tessalon perles three times a day as needed for cough  Flonase nasal spray once day  Flutter valve 2-3 times a day every for chest congesting   Needs cleaner for CPAP machine- [facility]_  DME company [employer]   Follow up in 3 months with Dr. Craige Cotta

## 2018-11-25 ENCOUNTER — Encounter: Payer: Self-pay | Admitting: Primary Care

## 2018-11-25 NOTE — Assessment & Plan Note (Addendum)
-   Not wearing CPAP for the last 1-2 months which could have contributed to low O2 saturation during shoulder surgery - Recommend patient resume wearing CPAP mach every night for 4-6 hours - Needs new cleaner for device, gave recommendations - FU in 3 months

## 2018-11-25 NOTE — Assessment & Plan Note (Addendum)
-   Reports persistent dry cough and low O2 sat - O2 today 91-98% RA - CXR showed mild elevation of the right hemidiaphragm is noted. Mildly prominent interstitial lung markings are noted which is stable across prior studies - Check HRCT  - Encourage flutter device 2-3 times a day - RX tesslone perles TID - Recommend flonase nasal spray daily

## 2018-12-04 ENCOUNTER — Other Ambulatory Visit: Payer: Self-pay | Admitting: Internal Medicine

## 2018-12-04 ENCOUNTER — Other Ambulatory Visit: Payer: Self-pay | Admitting: Adult Health

## 2018-12-04 DIAGNOSIS — G2581 Restless legs syndrome: Secondary | ICD-10-CM

## 2018-12-05 ENCOUNTER — Other Ambulatory Visit: Payer: Self-pay | Admitting: Internal Medicine

## 2018-12-05 DIAGNOSIS — M797 Fibromyalgia: Secondary | ICD-10-CM

## 2018-12-08 ENCOUNTER — Other Ambulatory Visit: Payer: Self-pay | Admitting: Adult Health Nurse Practitioner

## 2018-12-08 DIAGNOSIS — I10 Essential (primary) hypertension: Secondary | ICD-10-CM

## 2018-12-11 ENCOUNTER — Other Ambulatory Visit: Payer: Self-pay | Admitting: Internal Medicine

## 2018-12-11 DIAGNOSIS — J0140 Acute pansinusitis, unspecified: Secondary | ICD-10-CM

## 2018-12-13 NOTE — Progress Notes (Signed)
Assessment and Plan:  Discussed symptoms and current pulmonology work up.  Consider GERD? Allergies?  Discussed importance of follow through with CPAP usage, flutter valve, antihistamine use to evaluate for improvement. Discussed relationship with GERD.  Bronchiectasis without complication (HCC) - Reports persistent dry cough and low O2 sat - O2 today 91-98% RA - CXR from  5/27 showed mild elevation of the right hemidiaphragm is noted. Mildly prominent interstitial lung markings are noted which is stable across prior studies - Check HRCT  - Encourage flutter device 2-3 times a day - RX tessalon perles TID - Recommend flonase nasal spray daily   Obstructive sleep apnea - Not wearing CPAP for the last 1-2 months which could have contributed to low O2 saturation during shoulder surgery - Recommend patient resume wearing CPAP mach every night for 4-6 hours - Needs new cleaner for device, gave recommendations - FU in 3 months  Cough Follows with pulmonology Discussed multiple causes of this Discussed antihistamine and PPI treatment.  GERD Taking Famotadine 40mg  daily Consider BID dosing? Denies any symptoms Cough related to GERD? Discussed other treatment options Follows with GI, waiting Humaira approval  Monitor diet and triggers.   Further disposition pending results of labs. Discussed med's effects and SE's.   Over 30 minutes of exam, counseling, chart review, and critical decision making was performed.   Future Appointments  Date Time Provider Department Center  01/20/2019 11:30 AM [facility] 1 [facility]  10/25/2019 11:15 AM Judd Gaudierorbett, Ashley, NP [facility] None    ------------------------------------------------------------------------------------------------------------------   HPI 68 y.o.female presents for evaluation of cough.  She was seen by Pulmonologist on 5/27 for shortness of breath and dry cough.  CXR no cardiopulmonary active disease.  She has follow up  on 01/20/19 for CT CHR.   Reports the cough is worse in morning and night when she lays down.  Also coughing spells when she talks.  She has not been using her CPAP.  Reports she has purchased a cleaning systema nd plans to restart this.  She has been using flutter valve 1-2 times a day.  She has been using fluticasone daily and requests a refill today.  She has not been taking an antihistamine daily. Discussed restating this to rule out allergy causes.  She recently switched to famotidine 40mg  daily and follows with GI.  Past Medical History:  Diagnosis Date  . Arthritis   . ASCVD (arteriosclerotic cardiovascular disease)   . Closed nondisplaced subtrochanteric fracture of left femur (HCC) 09/03/2017   September 2018.  Status post surgery by Dr. Carola FrostHandy  . Depression   . Fibromyalgia   . Gait disorder 10/26/2012  . GERD (gastroesophageal reflux disease)   . GI bleed   . Hyperlipidemia   . Hypertension   . Hypothyroid   . Periprosthetic supracondylar fracture of femur, left  03/23/2017  . Restless leg syndrome   . Rhinitis 06/25/2010  . Sleep apnea   . TIA (transient ischemic attack)    3        . Vitamin D deficiency      Allergies  Allergen Reactions  . Atorvastatin Other (See Comments)    Extreme pain  . Codeine Other (See Comments)    Headache  . Pravastatin Other (See Comments)    Exreme pain  . Statins Other (See Comments)    Extreme pain  . Amitriptyline Other (See Comments)    Unspecified  . Fish Oil Nausea Only  . Linseed Oil Other (See Comments)  Other Reaction: Other reaction  . Nuvigil [Armodafinil] Other (See Comments)    Unspecified  . Sulfa Antibiotics Other (See Comments)  . Erythromycin Other (See Comments)    Reaction unspecified  . Erythromycin Base Rash  . Flax Seed [Bio-Flax] Rash    Linseed  . Hydrocodone-Acetaminophen Rash  . Morphine Nausea And Vomiting and Rash  . Nsaids Nausea Only  . Sertraline Rash  . Sinemet [Carbidopa W-Levodopa] Rash  .  Sulfamethoxazole Rash    Current Outpatient Medications on File Prior to Visit  Medication Sig  . aspirin EC 81 MG tablet Take 81 mg by mouth daily.  . benzonatate (TESSALON) 200 MG capsule Take 1 cap three times daily as needed for cough.  . bisoprolol-hydrochlorothiazide (ZIAC) 5-6.25 MG tablet TAKE 1 TABLET BY MOUTH TWICE A DAY  . buPROPion (WELLBUTRIN XL) 300 MG 24 hr tablet Take 1 tablet daily for mood and focus.  . Cyanocobalamin (B-12 SL) Place under the tongue daily.  . cyclobenzaprine (FLEXERIL) 5 MG tablet TAKE 1/2 TO 1 TABLET BY MOUTH 2-3 TIMES A DAY ONLY IF NEEDED FOR MUSCLE SPASM  . diclofenac sodium (VOLTAREN) 1 % GEL Apply 2 g topically 2 (two) times daily as needed.   . DULoxetine (CYMBALTA) 60 MG capsule Take 1 capsule Daily for Chronic Pain  . fenofibrate micronized (LOFIBRA) 134 MG capsule TAKE 1 CAPSULE BY MOUTH DAILY BEFORE BREAKFAST  . ferrous sulfate 325 (65 FE) MG tablet Take 325 mg by mouth daily.   [name] gabapentin (NEURONTIN) 300 MG capsule TAKE ONE CAPSULE 3 TIMES A DAY  . levothyroxine (SYNTHROID, LEVOTHROID) 100 MCG tablet TAKE 1 TABLET BY MOUTH ON TUES, WED, THURS, SAT AND SUN. TAKE 1& 1/2 TABLET ON MON AND FRI (Patient taking differently: Take one tablet daily)  . lidocaine (XYLOCAINE) 2 % jelly apply small amount topically to affected area 2-3 times a day  . meclizine (ANTIVERT) 25 MG tablet TAKE 1 TABLET (25 MG TOTAL) BY MOUTH 3 (THREE) TIMES DAILY AS NEEDED FOR DIZZINESS.  [name] NEOMYCIN-POLYMYXIN-HYDROCORTISONE (CORTISPORIN) 1 % SOLN OTIC solution Place 3 drops into both ears 4 (four) times daily.  [name] nystatin (MYCOSTATIN) 100000 UNIT/ML suspension KEEP 5 ML IN MOUTH AS LONG AS POSSIBLE 4 TIMES A DAY(SWISH & SPIT)USE 48 HRS AFTER SYMPTOMS RESOLVE  . ondansetron (ZOFRAN ODT) 4 MG disintegrating tablet Take 1 tablet (4 mg total) by mouth every 8 (eight) hours as needed for nausea or vomiting.  [name] oxybutynin (DITROPAN) 5 MG tablet Take 1 tablet (5 mg total) by mouth 2  (two) times daily.  [name] Phenylephrine-guaiFENesin (MUCUS RELIEF D PO) Take by mouth daily.  . polyethylene glycol (MIRALAX / GLYCOLAX) packet Take 17 g by mouth daily as needed for mild constipation.  . Probiotic Product (PROBIOTIC DAILY PO) Take by mouth daily.  . promethazine-dextromethorphan (PROMETHAZINE-DM) 6.25-15 MG/5ML syrup Use 1 to 2 teaspoonful every 4 hours if needed for severe cough  . Respiratory Therapy Supplies (FLUTTER) DEVI Use flutter valve 3 times a day  . rOPINIRole (REQUIP) 2 MG tablet Take 1 tablet 4 x /day for Restless Legs  . TRAMADOL HCL ER PO Take by mouth as needed.   . Vitamin D, Ergocalciferol, (DRISDOL) 1.25 MG (50000 UT) CAPS capsule Take 1 capsule Daily for Severe Vitamin Deficiency (Patient taking differently: 3 (three) times a week. Take 1 capsule Daily for Severe Vitamin Deficiency)  . Wheat Dextrin (BENEFIBER PO) Take by mouth.  . budesonide (ENTOCORT EC) 3 MG 24 hr capsule Take 3 mg  by mouth daily.  [name]. DIGESTIVE ENZYMES PO Take by mouth 2 (two) times daily.  . mupirocin ointment (BACTROBAN) 2 % Place 1 application into the nose 2 (two) times daily.  . phentermine (ADIPEX-P) 37.5 MG tablet Take 1/2-1 tab daily as needed for appetite and weight loss. Take regular drug breaks to avoid tolerance. (Patient not taking: Reported on 11/24/2018)   No current facility-administered medications on file prior to visit.     ROS: all negative except above.   Physical Exam:  BP 104/64   Pulse 90   Temp (!) 97 F (36.1 C)   Ht 5\' 4"  (1.626 m)   Wt 173 lb 6.4 oz (78.7 kg)   SpO2 98%   BMI 29.76 kg/m   General Appearance: Well nourished, in no apparent distress. Eyes: PERRLA, EOMs, conjunctiva no swelling or erythema Sinuses: No Frontal/maxillary tenderness ENT/Mouth: Ext aud canals clear, TMs without erythema, bulging. No erythema, swelling, or exudate on post pharynx.  Tonsils not swollen or erythematous. Hearing normal.  Neck: Supple, thyroid normal.   Respiratory: Respiratory effort normal, BS equal bilaterally without rales, rhonchi, wheezing or stridor. Cough during conversation. Cardio: RRR with no MRGs. Brisk peripheral pulses without edema.  Abdomen: Soft, + BS.  Non tender, no guarding, rebound, hernias, masses. Lymphatics: Non tender without lymphadenopathy.  Musculoskeletal: Full ROM BLE LUE, 5/5 strength, normal gait. RUE decreased ROM s/p replacement. Skin: Warm, dry without rashes, lesions, ecchymosis.  Neuro: Cranial nerves intact. Normal muscle tone, no cerebellar symptoms. Sensation intact.  Psych: Awake and oriented X 3, normal affect, Insight and Judgment appropriate.     Elder NegusKyra Maty Zeisler, NP 11:00AM [facility]

## 2018-12-14 ENCOUNTER — Ambulatory Visit: Payer: Medicare Other | Admitting: Adult Health Nurse Practitioner

## 2018-12-14 ENCOUNTER — Encounter: Payer: Self-pay | Admitting: Adult Health Nurse Practitioner

## 2018-12-14 ENCOUNTER — Other Ambulatory Visit: Payer: Self-pay

## 2018-12-14 VITALS — BP 104/64 | HR 90 | Temp 97.0°F | Ht 64.0 in | Wt 173.4 lb

## 2018-12-14 DIAGNOSIS — K219 Gastro-esophageal reflux disease without esophagitis: Secondary | ICD-10-CM | POA: Diagnosis not present

## 2018-12-14 DIAGNOSIS — G4733 Obstructive sleep apnea (adult) (pediatric): Secondary | ICD-10-CM | POA: Diagnosis not present

## 2018-12-14 DIAGNOSIS — R05 Cough: Secondary | ICD-10-CM

## 2018-12-14 DIAGNOSIS — J309 Allergic rhinitis, unspecified: Secondary | ICD-10-CM

## 2018-12-14 DIAGNOSIS — R059 Cough, unspecified: Secondary | ICD-10-CM

## 2018-12-14 MED ORDER — FLUTICASONE PROPIONATE 50 MCG/ACT NA SUSP: NASAL | 3 refills | Status: DC

## 2018-12-14 NOTE — Patient Instructions (Signed)
  Start taking the Aller-Tech (zyrtec) daily along with the Flonase  Continue taking the famotidine, start taking before dinner to see if this reduces your symptoms.  After trying the allergy medication maybe we could consider changing famotidine to twice a day.  Start wearing your CPAP  Continue to use Flutter device, increase to 3-4 times a day.  Contact office with new or worsening symptoms  Consider contact to pulmonology for earlier testing?

## 2018-12-17 ENCOUNTER — Encounter: Payer: Self-pay | Admitting: Internal Medicine

## 2018-12-20 ENCOUNTER — Other Ambulatory Visit: Payer: Self-pay | Admitting: Internal Medicine

## 2018-12-24 ENCOUNTER — Other Ambulatory Visit: Payer: Self-pay | Admitting: Internal Medicine

## 2018-12-24 DIAGNOSIS — B370 Candidal stomatitis: Secondary | ICD-10-CM

## 2018-12-24 DIAGNOSIS — R682 Dry mouth, unspecified: Secondary | ICD-10-CM

## 2018-12-30 ENCOUNTER — Other Ambulatory Visit: Payer: Self-pay | Admitting: Internal Medicine

## 2019-01-06 ENCOUNTER — Telehealth: Payer: Self-pay | Admitting: Internal Medicine

## 2019-01-06 NOTE — Telephone Encounter (Signed)
Patient called to request referralautoimmune/derm/oral musosal disorder specialist to evaluate chronic sores in  mouth, mucositis.  . Per Dr Unk Pinto,  referral sent to Dr Baxter Kail, [facility] ph [phone]. Fax [phone]. Charts are reviewed on a case by case basis. Patient and our office will be notified outcome of referral. Patient aware of referral and greatly appreciates.   Patient states she is currently rinsing mouth with vinegar & water. No longer using Nystatin for mouth sores/oral mucositis, issue not resolved.  She thinks Ziac or Humira is possible cause of mouth issues.

## 2019-01-10 ENCOUNTER — Telehealth: Payer: Self-pay | Admitting: Physician Assistant

## 2019-01-10 DIAGNOSIS — K121 Other forms of stomatitis: Secondary | ICD-10-CM

## 2019-01-10 NOTE — Telephone Encounter (Signed)
-----   Message from Crystal [name] sent at 01/10/2019 10:13 AM EDT ----- Regarding: labs prior to referral Before we send patient to [name], did you want any current labs drawn? Last labs 05/2018. If so, office visit or lab/nurse visit?

## 2019-01-11 ENCOUNTER — Telehealth: Payer: Self-pay | Admitting: Adult Health Nurse Practitioner

## 2019-01-11 NOTE — Telephone Encounter (Signed)
Patient follows [facility], and self scheduled an post surgery follow up appointment with specialist regarding this complaint.  Patient called our office to request a right shoulder xray.  She states,  fell on her right shoulder and would like an xray to evaluate. she will take xray to appointment with [facility] later this month.  Please advise.

## 2019-01-12 ENCOUNTER — Ambulatory Visit: Payer: Medicare Other

## 2019-01-12 ENCOUNTER — Other Ambulatory Visit: Payer: Self-pay

## 2019-01-12 DIAGNOSIS — K121 Other forms of stomatitis: Secondary | ICD-10-CM

## 2019-01-12 NOTE — Progress Notes (Signed)
Patient reports for LAB only visit, which the LAB have been entered into EPIC at the time of check in. Lab slip was given to patient for check out.

## 2019-01-13 ENCOUNTER — Other Ambulatory Visit: Payer: Self-pay | Admitting: Physician Assistant

## 2019-01-13 LAB — HSV(HERPES SIMPLEX VRS) I + II AB-IGG
HAV 1 IGG,TYPE SPECIFIC AB: <0.9 index
HSV 2 IGG,TYPE SPECIFIC AB: 9.86 index — ABNORMAL HIGH

## 2019-01-13 MED ORDER — ACYCLOVIR 400 MG PO TABS: 400.0000 mg | ORAL_TABLET | Freq: Three times a day (TID) | ORAL | 1 refills | Status: DC

## 2019-01-17 ENCOUNTER — Other Ambulatory Visit: Payer: Self-pay | Admitting: Adult Health

## 2019-01-20 ENCOUNTER — Ambulatory Visit (INDEPENDENT_AMBULATORY_CARE_PROVIDER_SITE_OTHER)
Admission: RE | Admit: 2019-01-20 | Discharge: 2019-01-20 | Disposition: A | Discharge status: Home / Self Care | Payer: Medicare Other | Source: Ambulatory Visit | Attending: Primary Care | Admitting: Primary Care

## 2019-01-20 ENCOUNTER — Other Ambulatory Visit: Payer: Self-pay | Admitting: Physician Assistant

## 2019-01-20 ENCOUNTER — Other Ambulatory Visit: Payer: Self-pay

## 2019-01-20 DIAGNOSIS — J471 Bronchiectasis with (acute) exacerbation: Secondary | ICD-10-CM

## 2019-01-21 ENCOUNTER — Other Ambulatory Visit: Payer: Self-pay | Admitting: Internal Medicine

## 2019-01-24 ENCOUNTER — Other Ambulatory Visit: Payer: Self-pay | Admitting: Internal Medicine

## 2019-01-24 ENCOUNTER — Telehealth: Payer: Self-pay

## 2019-01-24 DIAGNOSIS — Z1231 Encounter for screening mammogram for malignant neoplasm of breast: Secondary | ICD-10-CM

## 2019-01-24 NOTE — Telephone Encounter (Signed)
Re: Acyclovir  Wants to know if she should continue taking the prescription regularly or just during flare ups?  States that she still has a little thrush right now.

## 2019-01-25 NOTE — Telephone Encounter (Signed)
Left message to call back  

## 2019-02-11 NOTE — Progress Notes (Signed)
Reviewed and agree with assessment/plan.   Dyna Figuereo, MD [facility] 06/25/2016, 12:24 PM Pager:  [phone]  

## 2019-02-16 ENCOUNTER — Telehealth: Payer: Self-pay | Admitting: Physician Assistant

## 2019-02-16 NOTE — Telephone Encounter (Signed)
called patient to schedule re evaluation of mouth sores/film in mouth. Patient out of town til September. she requests televisit and send photo in My Chart

## 2019-02-17 ENCOUNTER — Other Ambulatory Visit: Payer: Self-pay

## 2019-02-17 ENCOUNTER — Encounter: Payer: Self-pay | Admitting: Physician Assistant

## 2019-02-17 ENCOUNTER — Ambulatory Visit: Payer: Medicare Other | Admitting: Physician Assistant

## 2019-02-17 DIAGNOSIS — R682 Dry mouth, unspecified: Secondary | ICD-10-CM | POA: Diagnosis not present

## 2019-02-17 DIAGNOSIS — B370 Candidal stomatitis: Secondary | ICD-10-CM | POA: Diagnosis not present

## 2019-02-17 MED ORDER — FLUCONAZOLE 100 MG PO TABS: 100.0000 mg | ORAL_TABLET | Freq: Every day | ORAL | 0 refills | Status: AC

## 2019-02-17 MED ORDER — NYSTATIN 100000 UNIT/ML MT SUSP: OROMUCOSAL | 0 refills | Status: DC

## 2019-02-17 NOTE — Progress Notes (Signed)
Subjective:    Patient ID: Crystal [name], female    DOB: 1950/12/29, 68 y.o.   MRN: [id number]  HPI  68 y.o. WF with multiple problems having mouth ulcers x last April, about 17 months. Having white plaques on her mucosa, will be able to scrap this off and she has bilateral corner of her mouth that is painful. Has seen dermatology and ENT for this, has referral pending for autoimmune/derm at [facility].  She states she saw no difference with the acyclovir but she did have some difference with an antibiotic, levaquin but that is came right back and worse.  States that she has seen derm and had + for yeast and states that yeast treatment helps the most.  She will stop phentermine due to contributing to dry mouth.  Has been on oxybutynin x 2012, on for ICS.  Has had Neurontin and cymbalta decreased recently. Will be home in Sept.   She is seeing surgery on her right shoulder and seeing Dr. Dorothyann Peng for her cyst behind her knee.   There were no vitals taken for this visit.  Medications Current Outpatient Medications on File Prior to Visit  Medication Sig  . Adalimumab 40 MG/0.4ML PNKT Inject into the skin.  [name] aspirin EC 81 MG tablet Take 81 mg by mouth daily.  . benzonatate (TESSALON) 200 MG capsule Take 1 cap three times daily as needed for cough.  [name] BIOTIN PO Take by mouth.  . bisoprolol-hydrochlorothiazide (ZIAC) 5-6.25 MG tablet TAKE 1 TABLET BY MOUTH TWICE A DAY  . buPROPion (WELLBUTRIN XL) 300 MG 24 hr tablet Take 1 tablet daily for mood and focus.  . Cyanocobalamin (B-12 SL) Place under the tongue daily.  . cyclobenzaprine (FLEXERIL) 5 MG tablet Take 1/2 to 1 tablet 2 to 3 x /day ONLY if needed for Muscle Spasm  . diclofenac sodium (VOLTAREN) 1 % GEL Apply 2 g topically 2 (two) times daily as needed.   [name] DIGESTIVE ENZYMES PO Take by mouth 2 (two) times daily.  . DULoxetine (CYMBALTA) 60 MG capsule Take 1 capsule Daily for Chronic Pain  . famotidine (PEPCID) 40 MG tablet Take 40 mg by  mouth daily.  . fenofibrate micronized (LOFIBRA) 134 MG capsule TAKE 1 CAPSULE BY MOUTH EVERY DAY BEFORE BREAKFAST  . ferrous sulfate 325 (65 FE) MG tablet Take 325 mg by mouth daily.   . fluticasone (FLONASE) 50 MCG/ACT nasal spray USE TWO SPRAYS IN EACH NOSTRIL DAILY AS NEEDED  . gabapentin (NEURONTIN) 300 MG capsule Take 1 capsule 3 x/day for Chronic Pain  . levothyroxine (SYNTHROID, LEVOTHROID) 100 MCG tablet TAKE 1 TABLET BY MOUTH ON TUES, WED, THURS, SAT AND SUN. TAKE 1& 1/2 TABLET ON MON AND FRI (Patient taking differently: Take one tablet daily)  . lidocaine (XYLOCAINE) 2 % jelly apply small amount topically to affected area 2-3 times a day  . meclizine (ANTIVERT) 25 MG tablet TAKE 1 TABLET (25 MG TOTAL) BY MOUTH 3 (THREE) TIMES DAILY AS NEEDED FOR DIZZINESS.  . mupirocin ointment (BACTROBAN) 2 % Place 1 application into the nose 2 (two) times daily.  . NEOMYCIN-POLYMYXIN-HYDROCORTISONE (CORTISPORIN) 1 % SOLN OTIC solution Place 3 drops into both ears 4 (four) times daily.  [name] nystatin (MYCOSTATIN) 100000 UNIT/ML suspension KEEP 5 ML IN MOUTH AS LONG AS POSSIBLE 4 TIMES A DAY(SWISH & SPIT)USE 48 HRS AFTER SYMPTOMS RESOLVE  . ondansetron (ZOFRAN ODT) 4 MG disintegrating tablet Take 1 tablet (4 mg total) by mouth every 8 (eight) hours  as needed for nausea or vomiting.  [name] oxybutynin (DITROPAN) 5 MG tablet Take 1 tablet 2 x /day for Bladder Control  . phentermine (ADIPEX-P) 37.5 MG tablet Take 1/2-1 tab daily as needed for appetite and weight loss. Take regular drug breaks to avoid tolerance.  [name] Phenylephrine-guaiFENesin (MUCUS RELIEF D PO) Take by mouth daily.  . polyethylene glycol (MIRALAX / GLYCOLAX) packet Take 17 g by mouth daily as needed for mild constipation.  [name] PREBIOTIC PRODUCT PO Take by mouth.  . Probiotic Product (PROBIOTIC DAILY PO) Take by mouth daily.  . promethazine-dextromethorphan (PROMETHAZINE-DM) 6.25-15 MG/5ML syrup Use 1 to 2 teaspoonful every 4 hours if needed for severe  cough  . Respiratory Therapy Supplies (FLUTTER) DEVI Use flutter valve 3 times a day  . rOPINIRole (REQUIP) 2 MG tablet Take 1 tablet 4 x /day for Restless Legs  . TRAMADOL HCL ER PO Take by mouth as needed.   . Vitamin D, Ergocalciferol, (DRISDOL) 1.25 MG (50000 UT) CAPS capsule Take 1 capsule Daily for Severe Vitamin Deficiency (Patient taking differently: 3 (three) times a week. Take 1 capsule Daily for Severe Vitamin Deficiency)  . Wheat Dextrin (BENEFIBER PO) Take by mouth.   No current facility-administered medications on file prior to visit.     Problem list She has Obstructive sleep apnea; RESTLESS LEG SYNDROME; Essential hypertension; Rhinitis; GERD; Crohn's Enterocolitis / Regional enteritis; Fibromyalgia; Gait disorder; Hyperlipidemia; Type 2 diabetes mellitus with hyperlipidemia ([place]); Hypothyroid; Depression, major, recurrent, in partial remission ([place]); Vitamin D deficiency; ASCVD; Medication management; Rotator cuff arthropathy; Obesity (BMI 30.0-34.9); Spinal stenosis in cervical region; IBS (irritable colon syndrome); Sleep talking; B12 deficiency anemia; Polypharmacy; Bronchiectasis without complication ([place]); Arthritis of carpometacarpal (CMC) joints of both thumbs; Bilateral bunions; Cervical disc disease; Constipation; Crohn's ileitis ([place]); Rotator cuff tear; Diverticulosis; History of adenomatous polyp of colon; Internal hemorrhoids; Knee pain; Other specified arthropathy involving shoulder region; S/P revision of total knee, left; TIA (transient ischemic attack); Diabetes mellitus with chronic kidney disease ([place]); and Rupture of popliteal cyst of right knee region (complex cyst) on their problem list.     Review of Systems  Constitutional: Positive for fatigue. Negative for chills and fever.  HENT: Positive for mouth sores. Negative for congestion, ear pain, facial swelling, postnasal drip, rhinorrhea, sinus pressure, sinus pain, sore throat, trouble swallowing and voice  change.   Respiratory: Positive for shortness of breath. Negative for apnea, cough, choking, chest tightness, wheezing and stridor.   Cardiovascular: Negative for chest pain, palpitations and leg swelling.  Gastrointestinal: Positive for diarrhea and nausea. Negative for abdominal distention, abdominal pain, anal bleeding, blood in stool, constipation, rectal pain and vomiting.  Genitourinary: Negative.   Musculoskeletal: Positive for arthralgias.  Neurological: Negative.   Hematological: Bruises/bleeds easily.       Objective:   Physical Exam General Appearance:Well sounding, in no apparent distress.  ENT/Mouth: No hoarseness, No cough for duration of visit.  Respiratory: completing full sentences without distress, without audible wheeze Neuro: Awake and oriented X 3,  Psych:  Insight and Judgment appropriate.       Assessment & Plan:   Dry mouth -     nystatin (MYCOSTATIN) 100000 UNIT/ML suspension; KEEP 5 ML IN MOUTH AS LONG AS POSSIBLE 4 TIMES A DAY(SWISH & SPIT)USE 48 HRS AFTER SYMPTOMS RESOLVE  Thrush -     nystatin (MYCOSTATIN) 100000 UNIT/ML suspension; KEEP 5 ML IN MOUTH AS LONG AS POSSIBLE 4 TIMES A DAY(SWISH & SPIT)USE 48 HRS AFTER SYMPTOMS RESOLVE  Other orders -  fluconazole (DIFLUCAN) 100 MG tablet; Take 1 tablet (100 mg total) by mouth daily.  ? Need to change oxybutynin or if medications are causing patient is on several medications States acyclovir did not help, will not do more Will treat long term for possible yeast Will send in referral for specialist

## 2019-02-25 ENCOUNTER — Telehealth: Payer: Self-pay | Admitting: Physician Assistant

## 2019-02-25 MED ORDER — ACYCLOVIR 400 MG PO TABS: 400.0000 mg | ORAL_TABLET | Freq: Three times a day (TID) | ORAL | 1 refills | Status: DC

## 2019-02-25 NOTE — Telephone Encounter (Signed)
-----   Message from Elenor Quinones, [place] sent at 02/24/2019  3:01 PM EDT ----- Regarding: MED QUESTION Contact: [phone] Patient would like to know if she should continue the ACYCLOVIR.  IF SO she would like some sent to her pharmacy please.   [facility]

## 2019-03-02 ENCOUNTER — Encounter: Payer: Self-pay | Admitting: Internal Medicine

## 2019-03-02 ENCOUNTER — Other Ambulatory Visit: Payer: Self-pay

## 2019-03-02 ENCOUNTER — Ambulatory Visit (INDEPENDENT_AMBULATORY_CARE_PROVIDER_SITE_OTHER): Payer: Medicare Other | Admitting: Internal Medicine

## 2019-03-02 VITALS — BP 122/82 | HR 80 | Temp 97.2°F | Resp 16 | Ht 62.75 in | Wt 168.2 lb

## 2019-03-02 DIAGNOSIS — Z136 Encounter for screening for cardiovascular disorders: Secondary | ICD-10-CM

## 2019-03-02 DIAGNOSIS — K5000 Crohn's disease of small intestine without complications: Secondary | ICD-10-CM

## 2019-03-02 DIAGNOSIS — Z0001 Encounter for general adult medical examination with abnormal findings: Secondary | ICD-10-CM

## 2019-03-02 DIAGNOSIS — Z8249 Family history of ischemic heart disease and other diseases of the circulatory system: Secondary | ICD-10-CM | POA: Diagnosis not present

## 2019-03-02 DIAGNOSIS — E1122 Type 2 diabetes mellitus with diabetic chronic kidney disease: Secondary | ICD-10-CM

## 2019-03-02 DIAGNOSIS — E1169 Type 2 diabetes mellitus with other specified complication: Secondary | ICD-10-CM

## 2019-03-02 DIAGNOSIS — E559 Vitamin D deficiency, unspecified: Secondary | ICD-10-CM

## 2019-03-02 DIAGNOSIS — Z0000 Encounter for general adult medical examination without abnormal findings: Secondary | ICD-10-CM | POA: Diagnosis not present

## 2019-03-02 DIAGNOSIS — E039 Hypothyroidism, unspecified: Secondary | ICD-10-CM

## 2019-03-02 DIAGNOSIS — Z79899 Other long term (current) drug therapy: Secondary | ICD-10-CM

## 2019-03-02 DIAGNOSIS — G4733 Obstructive sleep apnea (adult) (pediatric): Secondary | ICD-10-CM

## 2019-03-02 DIAGNOSIS — K219 Gastro-esophageal reflux disease without esophagitis: Secondary | ICD-10-CM

## 2019-03-02 DIAGNOSIS — I10 Essential (primary) hypertension: Secondary | ICD-10-CM

## 2019-03-02 DIAGNOSIS — D513 Other dietary vitamin B12 deficiency anemia: Secondary | ICD-10-CM

## 2019-03-02 DIAGNOSIS — N182 Chronic kidney disease, stage 2 (mild): Secondary | ICD-10-CM

## 2019-03-02 NOTE — Patient Instructions (Signed)

## 2019-03-02 NOTE — Progress Notes (Signed)
Annual Screening/Preventative Visit & Comprehensive Evaluation &  Examination     This very nice 68 y.o. [name] presents for a Screening /Preventative Visit & comprehensive evaluation and management of multiple medical co-morbidities.  Patient has been followed for HTN, HLD, Pre-Diabetes, GERD, Crohn's Dz, Hypothyroidism, OSA/BiPAP  and Vitamin D Deficiency.  Patient does not use her CPAP/BiPAP. 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 Patient is followed by Dr Kinnie ScalesMedoff for her Crohn's Dz.      Patient has ongoing c/o "mouth ulcers" and has been on long tern treatment with acyclovir, Diflucan & Nystatin susp , neither of which have "cured " her c/o & she's been advised to discontinue pending [facility] with [facility] Inis Sizeronna Culton[facility] in consideration of possible autoimmune etiology of her "mouth ulcers" due to history of Crohn's Disease.         [facility] for a Rt Reverse total shoulder joint replacement. Patient has had hx/o   #24 previous surgeries to date.       HTN predates since 2006. Patient's BP has been controlled at home and patient denies any cardiac symptoms as chest pain, palpitations, shortness of breath, dizziness or ankle swelling. Today's BP is at goal - 122/82.      Patient's hyperlipidemia is controlled with diet and medications. Patient denies myalgias or other medication SE's. Last lipids were at goal: Lab Results  Component Value Date   CHOL 170 02/10/2018   HDL 63 02/10/2018   LDLCALC 90 02/10/2018   TRIG 79 02/10/2018   CHOLHDL 2.7 02/10/2018      Patient has moderate Obesity (BMI  30+) and hx/o diet controlled T2_DM/CKD3 since 2016 (A1c 6.9%).  Patient denies reactive hypoglycemic symptoms, visual blurring, diabetic polys or paresthesias. Last A1c was not at goal: Lab Results  Component Value Date   HGBA1C 6.2 (H) 02/10/2018      Finally, patient has history of Vitamin D Deficiency  ("33" / 2008) and last Vitamin D was sl elevated: Lab Results  Component Value Date   VD25OH 107 (H) 02/10/2018   Current Outpatient Medications on File Prior to Visit  Medication Sig  . acyclovir (ZOVIRAX) 400 MG tablet Take 1 tablet (400 mg total) by mouth 3 (three) times daily for 10 days. Take 1 pill 2 times daily with food  . Adalimumab 40 MG/0.4ML PNKT Inject into the skin.  [name]. aspirin EC 81 MG tablet Take 81 mg by mouth daily.  . benzonatate (TESSALON) 200 MG capsule Take 1 cap three times daily as needed for cough.  [name]. BIOTIN PO Take by mouth.  . bisoprolol-hydrochlorothiazide (ZIAC) 5-6.25 MG tablet TAKE 1 TABLET BY MOUTH TWICE A DAY  . buPROPion (WELLBUTRIN XL) 300 MG 24 hr tablet Take 1 tablet daily for mood and focus.  . Cyanocobalamin (B-12 SL) Place under the tongue daily.  . cyclobenzaprine (FLEXERIL) 5 MG tablet Take 1/2 to 1 tablet 2 to 3 x /day ONLY if needed for Muscle Spasm  . diclofenac sodium (VOLTAREN) 1 % GEL Apply 2 g topically 2 (two) times daily as needed.   [name]. DIGESTIVE ENZYMES PO Take by mouth 2 (two) times daily.  . DULoxetine (CYMBALTA) 60 MG capsule Take 1 capsule Daily for Chronic Pain  . famotidine (PEPCID) 40 MG tablet Take 40 mg by mouth daily.  . fenofibrate micronized (LOFIBRA) 134 MG capsule TAKE 1 CAPSULE BY MOUTH EVERY DAY BEFORE BREAKFAST  . ferrous sulfate 325 (  65 FE) MG tablet Take 325 mg by mouth daily.   . fluticasone (FLONASE) 50 MCG/ACT nasal spray USE TWO SPRAYS IN EACH NOSTRIL DAILY AS NEEDED  . gabapentin (NEURONTIN) 300 MG capsule Take 1 capsule 3 x/day for Chronic Pain  . levothyroxine (SYNTHROID, LEVOTHROID) 100 MCG tablet TAKE 1 TABLET BY MOUTH ON TUES, WED, THURS, SAT AND SUN. TAKE 1& 1/2 TABLET ON MON AND FRI (Patient taking differently: Take one tablet daily)  . lidocaine (XYLOCAINE) 2 % jelly apply small amount topically to affected area 2-3 times a day  . meclizine (ANTIVERT) 25 MG tablet TAKE 1 TABLET (25 MG TOTAL) BY MOUTH 3 (THREE)  TIMES DAILY AS NEEDED FOR DIZZINESS.  [name]. NEOMYCIN-POLYMYXIN-HYDROCORTISONE (CORTISPORIN) 1 % SOLN OTIC solution Place 3 drops into both ears 4 (four) times daily.  [name]. nystatin (MYCOSTATIN) 100000 UNIT/ML suspension KEEP 5 ML IN MOUTH AS LONG AS POSSIBLE 4 TIMES A DAY(SWISH & SPIT)USE 48 HRS AFTER SYMPTOMS RESOLVE  . oxybutynin (DITROPAN) 5 MG tablet Take 1 tablet 2 x /day for Bladder Control  . Phenylephrine-guaiFENesin (MUCUS RELIEF D PO) Take by mouth daily.  . polyethylene glycol (MIRALAX / GLYCOLAX) packet Take 17 g by mouth daily as needed for mild constipation.  [name]. PREBIOTIC PRODUCT PO Take by mouth.  . Probiotic Product (PROBIOTIC DAILY PO) Take by mouth daily.  . promethazine-dextromethorphan (PROMETHAZINE-DM) 6.25-15 MG/5ML syrup Use 1 to 2 teaspoonful every 4 hours if needed for severe cough  . Respiratory Therapy Supplies (FLUTTER) DEVI Use flutter valve 3 times a day  . rOPINIRole (REQUIP) 2 MG tablet Take 1 tablet 4 x /day for Restless Legs  . TRAMADOL HCL ER PO Take by mouth as needed.   . Vitamin D, Ergocalciferol, (DRISDOL) 1.25 MG (50000 UT) CAPS capsule Take 1 capsule Daily for Severe Vitamin Deficiency (Patient taking differently: 3 (three) times a week. Take 1 capsule Daily for Severe Vitamin Deficiency)  . Wheat Dextrin (BENEFIBER PO) Take by mouth.  . fluconazole (DIFLUCAN) 100 MG tablet Take 1 tablet (100 mg total) by mouth daily.  . ondansetron (ZOFRAN ODT) 4 MG disintegrating tablet Take 1 tablet (4 mg total) by mouth every 8 (eight) hours as needed for nausea or vomiting.   No current facility-administered medications on file prior to visit.    Allergies  Allergen Reactions  . Atorvastatin Other (See Comments)    Extreme pain  . Codeine Other (See Comments)    Headache  . Pravastatin Other (See Comments)    Exreme pain  . Statins Other (See Comments)    Extreme pain  . Amitriptyline Other (See Comments)    Unspecified  . Fish Oil Nausea Only  . Linseed Oil Other  (See Comments)    Other Reaction: Other reaction  . Nuvigil [Armodafinil] Other (See Comments)    Unspecified  . Sulfa Antibiotics Other (See Comments)  . Erythromycin Other (See Comments)    Reaction unspecified  . Erythromycin Base Rash  . Flax Seed [Bio-Flax] Rash    Linseed  . Hydrocodone-Acetaminophen Rash  . Morphine Nausea And Vomiting and Rash  . Nsaids Nausea Only  . Sertraline Rash  . Sinemet [Carbidopa W-Levodopa] Rash  . Sulfamethoxazole Rash   Past Medical History:  Diagnosis Date  . Arthritis   . ASCVD (arteriosclerotic cardiovascular disease)   . Closed nondisplaced subtrochanteric fracture of left femur (HCC) 09/03/2017   September 2018.  Status post surgery by Dr. Carola FrostHandy  . Depression   . Fibromyalgia   . Gait  disorder 10/26/2012  . GERD (gastroesophageal reflux disease)   . GI bleed   . Hyperlipidemia   . Hypertension   . Hypothyroid   . Periprosthetic supracondylar fracture of femur, left  03/23/2017  . Restless leg syndrome   . Rhinitis 06/25/2010  . Sleep apnea   . TIA (transient ischemic attack)    3        . Vitamin D deficiency    Health Maintenance  Topic Date Due  . OPHTHALMOLOGY EXAM  12/10/2017  . HEMOGLOBIN A1C  08/13/2018  . INFLUENZA VACCINE  01/29/2019  . FOOT EXAM  03/01/2020  . MAMMOGRAM  04/06/2020  . TETANUS/TDAP  01/02/2025  . COLONOSCOPY  10/14/2027  . DEXA SCAN  Completed  . Hepatitis C Screening  Completed  . PNA vac Low Risk Adult  Completed   Immunization History  Administered Date(s) Administered  . DT 01/03/2015  . Influenza Split 04/16/2018  . Influenza Whole 03/30/2010  . Influenza, High Dose Seasonal PF 03/13/2015  . Influenza-Unspecified 03/31/2011, 05/14/2014, 02/27/2016  . Pneumococcal Conjugate-13 04/17/2015  . Pneumococcal Polysaccharide-23 01/06/2017  . Pneumococcal-Unspecified 06/30/2005  . Td 06/30/2004  . Zoster 07/01/2007   Last Colon -   Oct 2009 - Dr Kinnie Scales and 10 yr f/u due Nov 2019  (overdue)  Last MGM - 04/06/2018 and scheduled for 05/12/2019  Past Surgical History:  Procedure Laterality Date  . ABDOMINAL HYSTERECTOMY  1992  . ANTERIOR CERVICAL DECOMPRESSION/DISCECTOMY FUSION 4 LEVELS N/A 06/14/2015   Procedure: Cervical three-four Cervical four-five Cervical five- six Cervical six- seven  Anterior cervical decompression/diskectomy/fusion;  Surgeon: Maeola Harman, MD;  Location: [facility];  Service: Neurosurgery;  Laterality: N/A;  C3-4 C4-5 C5-6 C6-7 Anterior cervical decompression/diskectomy/fusion  . APPENDECTOMY  1960  . CESAREAN SECTION  1983 & 1886  . JOINT REPLACEMENT     2   LEFT  1 RIGHT   . KNEE SURGERY    . ORIF FEMUR FRACTURE Left 03/24/2017   Procedure: OPEN REDUCTION INTERNAL FIXATION (ORIF) FEMUR FRACTURE;  Surgeon: Myrene Galas, MD;  Location: [facility] OR;  Service: Orthopedics;  Laterality: Left;  . ROTATOR CUFF REPAIR Left 2005   2 LEFT  1  RIGHT  . SHOULDER ADHESION RELEASE    . SPINE SURGERY    . TOE SURGERY     LEFT       . TOE SURGERY     RIGHT  BIG TOE  . TONSILLECTOMY  1960   Family History  Problem Relation Age of Onset  . Leukemia Sister   . Lupus Sister   . Depression Sister   . Rheum arthritis Sister   . Heart disease Father   . Hypertension Father   . Hyperlipidemia Father   . Neuropathy Father   . Cancer Mother   . Diabetes Mother   . Osteoporosis Mother   . Breast cancer Mother   . Migraines Daughter    Social History   Tobacco Use  . Smoking status: Never Smoker  . Smokeless tobacco: Never Used  Substance Use Topics  . Alcohol use: Yes    Alcohol/week: 0.0 standard drinks    Comment: rarely  . Drug use: No    ROS Constitutional: Denies fever, chills, weight loss/gain, headaches, insomnia,  night sweats, and change in appetite. Does c/o fatigue. Eyes: Denies redness, blurred vision, diplopia, discharge, itchy, watery eyes.  ENT: Denies discharge, congestion, post nasal drip, epistaxis, sore throat, earache,  hearing loss, dental pain, Tinnitus, Vertigo, Sinus pain, snoring.  Cardio: Denies chest pain, palpitations, irregular heartbeat, syncope, dyspnea, diaphoresis, orthopnea, PND, claudication, edema Respiratory: denies cough, dyspnea, DOE, pleurisy, hoarseness, laryngitis, wheezing.  Gastrointestinal: Denies dysphagia, heartburn, reflux, water brash, pain, cramps, nausea, vomiting, bloating, diarrhea, constipation, hematemesis, melena, hematochezia, jaundice, hemorrhoids Genitourinary: Denies dysuria, frequency, urgency, nocturia, hesitancy, discharge, hematuria, flank pain Breast: Breast lumps, nipple discharge, bleeding.  Musculoskeletal: Denies arthralgia, myalgia, stiffness, Jt. Swelling, pain, limp, and strain/sprain. Denies falls. Skin: Denies puritis, rash, hives, warts, acne, eczema, changing in skin lesion Neuro: No weakness, tremor, incoordination, spasms, paresthesia, pain Psychiatric: Denies confusion, memory loss, sensory loss. Denies Depression. Endocrine: Denies change in weight, skin, hair change, nocturia, and paresthesia, diabetic polys, visual blurring, hyper / hypo glycemic episodes.  Heme/Lymph: No excessive bleeding, bruising, enlarged lymph nodes.  Physical Exam  BP 122/82   Pulse 80   Temp (!) 97.2 F (36.2 C)   Resp 16   Ht 5' 2.75" (1.594 m)   Wt 168 lb 3.2 oz (76.3 kg)   BMI 30.03 kg/m   General Appearance: Well nourished, well groomed and in no apparent distress.  Eyes: PERRLA, EOMs, conjunctiva no swelling or erythema, normal fundi and vessels. Sinuses: No frontal/maxillary tenderness ENT/Mouth: EACs patent / TMs  nl. Nares clear without erythema, swelling, mucoid exudates. Oral hygiene is good with NO ORAL LESIONS NOTED.  No erythema, swelling, or exudate. Tongue normal, non-obstructing. Tonsils not swollen or erythematous. Hearing normal.  Neck: Supple, thyroid not palpable. No bruits, nodes or JVD. Respiratory: Respiratory effort normal.  BS equal and  clear bilateral without rales, rhonci, wheezing or stridor. Cardio: Heart sounds are normal with regular rate and rhythm and no murmurs, rubs or gallops. Peripheral pulses are normal and equal bilaterally without edema. No aortic or femoral bruits. Chest: symmetric with normal excursions and percussion. Breasts: Symmetric, without lumps, nipple discharge, retractions, or fibrocystic changes.  Abdomen: Flat, soft with bowel sounds active. Nontender, no guarding, rebound, hernias, masses, or organomegaly.  Lymphatics: Non tender without lymphadenopathy.  Musculoskeletal: Full ROM all peripheral extremities, joint stability, 5/5 strength, and normal gait. Skin: Warm and dry without rashes, lesions, cyanosis, clubbing or  ecchymosis.  Neuro: Cranial nerves intact, reflexes equal bilaterally. Normal muscle tone, no cerebellar symptoms. Sensation intact to touch, vibratory and Monofilament to the toes bilaterally. Pysch: Alert and oriented X 3, normal affect, Insight and Judgment appropriate.   Assessment and Plan  1. Annual Preventative Screening Examination  2. Essential hypertension  - EKG 12-Lead - Urinalysis, Routine w reflex microscopic - CBC with Differential/Platelet - COMPLETE METABOLIC PANEL WITH GFR - Magnesium - TSH  3. Type 2 diabetes mellitus with hyperlipidemia (HCC)  - EKG 12-Lead - Lipid panel - TSH - Hemoglobin A1c - Insulin, random  4. Type 2 diabetes mellitus with stage 2 chronic kidney disease, without long-term current use of insulin (HCC)  - EKG 12-Lead - Urinalysis, Routine w reflex microscopic - HM DIABETES FOOT EXAM - LOW EXTREMITY NEUR EXAM DOCUM - Hemoglobin A1c - Insulin, random - Microalbumin / creatinine urine ratio  5. Vitamin D deficiency  - VITAMIN D 25 Hydroxyl  6. Gastroesophageal reflux disease  7. Obstructive sleep apnea  8. Hypothyroidism  9. Crohn's disease of ileum without complication ([place])  10. Other dietary vitamin B12  deficiency anemia  - Vitamin B12  11. Screening for ischemic heart disease  - EKG 12-Lead  12. FHx: heart disease  - EKG 12-Lead  13. Medication management - Urinalysis, Routine w reflex microscopic - CBC with Differential/Platelet -  COMPLETE METABOLIC PANEL WITH GFR - Magnesium - Lipid panel - TSH - Hemoglobin A1c - Insulin, random - VITAMIN D 25 Hydroxyl       Patient was counseled in prudent diet to achieve/maintain BMI less than 25 for weight control, BP monitoring, regular exercise and medications. Discussed med's effects and SE's. Screening labs and tests as requested with regular follow-up as recommended. Over 40 minutes of exam, counseling, chart review and high complex critical decision making was performed.   Marinus MawWilliam D Sydell Prowell, MD

## 2019-03-03 LAB — COMPLETE METABOLIC PANEL WITH GFR
AG Ratio: 1.7 (calc) (ref 1.0–2.5)
ALT: 12 U/L (ref 6–29)
AST: 17 U/L (ref 10–35)
Albumin: 4.6 g/dL (ref 3.6–5.1)
Alkaline phosphatase (APISO): 42 U/L (ref 37–153)
BUN: 23 mg/dL (ref 7–25)
CO2: 34 mmol/L — ABNORMAL HIGH (ref 20–32)
Calcium: 10.4 mg/dL (ref 8.6–10.4)
Chloride: 97 mmol/L — ABNORMAL LOW (ref 98–110)
Creat: 0.92 mg/dL (ref 0.60–0.93)
GFR, Est African American: 73 mL/min/{1.73_m2} (ref >=60)
GFR, Est Non African American: 63 mL/min/{1.73_m2} (ref >=60)
Globulin: 2.7 g/dL (calc) (ref 1.9–3.7)
Glucose, Bld: 140 mg/dL — ABNORMAL HIGH (ref 65–99)
Potassium: 3.9 mmol/L (ref 3.5–5.3)
Sodium: 140 mmol/L (ref 135–146)
Total Bilirubin: 0.5 mg/dL (ref 0.2–1.2)
Total Protein: 7.3 g/dL (ref 6.1–8.1)

## 2019-03-03 LAB — CBC WITH DIFFERENTIAL/PLATELET
Absolute Monocytes: 688 cells/uL (ref 200–950)
Basophils Absolute: 96 cells/uL (ref 0–200)
Basophils Relative: 1.2 %
Eosinophils Absolute: 128 cells/uL (ref 15–500)
Eosinophils Relative: 1.6 %
HCT: 46.9 % — ABNORMAL HIGH (ref 35.0–45.0)
Hemoglobin: 15.7 g/dL — ABNORMAL HIGH (ref 11.7–15.5)
Lymphs Abs: 2344 cells/uL (ref 850–3900)
MCH: 29.9 pg (ref 27.0–33.0)
MCHC: 33.5 g/dL (ref 32.0–36.0)
MCV: 89.3 fL (ref 80.0–100.0)
MPV: 9.6 fL (ref 7.5–12.5)
Monocytes Relative: 8.6 %
Neutro Abs: 4744 cells/uL (ref 1500–7800)
Neutrophils Relative %: 59.3 %
Platelets: 326 10*3/uL (ref 140–400)
RBC: 5.25 10*6/uL — ABNORMAL HIGH (ref 3.80–5.10)
RDW: 13.5 % (ref 11.0–15.0)
Total Lymphocyte: 29.3 %
WBC: 8 10*3/uL (ref 3.8–10.8)

## 2019-03-03 LAB — VITAMIN B12: Vitamin B-12: >2000 pg/mL — ABNORMAL HIGH (ref 200–1100)

## 2019-03-03 LAB — HEMOGLOBIN A1C
Hgb A1c MFr Bld: 5.8 % of total Hgb — ABNORMAL HIGH (ref <5.7)
Mean Plasma Glucose: 120 (calc)
eAG (mmol/L): 6.6 (calc)

## 2019-03-03 LAB — MAGNESIUM: Magnesium: 1.9 mg/dL (ref 1.5–2.5)

## 2019-03-03 LAB — LIPID PANEL
Cholesterol: 184 mg/dL (ref <200)
HDL: 48 mg/dL — ABNORMAL LOW (ref >=50)
LDL Cholesterol (Calc): 105 mg/dL (calc) — ABNORMAL HIGH
Non-HDL Cholesterol (Calc): 136 mg/dL (calc) — ABNORMAL HIGH (ref <130)
Total CHOL/HDL Ratio: 3.8 (calc) (ref <5.0)
Triglycerides: 185 mg/dL — ABNORMAL HIGH (ref <150)

## 2019-03-03 LAB — INSULIN, RANDOM: Insulin: 48.5 u[IU]/mL — ABNORMAL HIGH

## 2019-03-03 LAB — TSH: TSH: 0.93 mIU/L (ref 0.40–4.50)

## 2019-03-03 LAB — VITAMIN D 25 HYDROXY (VIT D DEFICIENCY, FRACTURES): Vit D, 25-Hydroxy: >150 ng/mL — ABNORMAL HIGH (ref 30–100)

## 2019-03-04 ENCOUNTER — Other Ambulatory Visit: Payer: Self-pay | Admitting: Adult Health Nurse Practitioner

## 2019-03-04 DIAGNOSIS — I10 Essential (primary) hypertension: Secondary | ICD-10-CM

## 2019-03-04 LAB — URINALYSIS, ROUTINE W REFLEX MICROSCOPIC
Bacteria, UA: NONE SEEN /HPF
Bilirubin Urine: NEGATIVE
Glucose, UA: NEGATIVE
Hgb urine dipstick: NEGATIVE
Hyaline Cast: NONE SEEN /LPF
Ketones, ur: NEGATIVE
Nitrite: NEGATIVE
Protein, ur: NEGATIVE
Specific Gravity, Urine: 1.027 (ref 1.001–1.03)
Squamous Epithelial / LPF: NONE SEEN /HPF (ref <=5)
pH: <=5 (ref 5.0–8.0)

## 2019-03-04 LAB — MICROALBUMIN / CREATININE URINE RATIO
Creatinine, Urine: 144 mg/dL (ref 20–275)
Microalb Creat Ratio: 7 mcg/mg creat (ref <30)
Microalb, Ur: 1 mg/dL

## 2019-03-05 ENCOUNTER — Other Ambulatory Visit: Payer: Self-pay | Admitting: Internal Medicine

## 2019-03-05 ENCOUNTER — Other Ambulatory Visit: Payer: Self-pay | Admitting: Physician Assistant

## 2019-03-05 DIAGNOSIS — R682 Dry mouth, unspecified: Secondary | ICD-10-CM

## 2019-03-05 DIAGNOSIS — B370 Candidal stomatitis: Secondary | ICD-10-CM

## 2019-03-18 ENCOUNTER — Encounter: Payer: Medicare Other | Admitting: Internal Medicine

## 2019-03-31 ENCOUNTER — Ambulatory Visit
Admission: RE | Admit: 2019-03-31 | Discharge: 2019-03-31 | Disposition: A | Discharge status: Home / Self Care | Payer: Medicare Other | Source: Ambulatory Visit | Attending: Adult Health | Admitting: Adult Health

## 2019-03-31 ENCOUNTER — Encounter: Payer: Self-pay | Admitting: Adult Health

## 2019-03-31 ENCOUNTER — Other Ambulatory Visit: Payer: Self-pay

## 2019-03-31 ENCOUNTER — Ambulatory Visit: Payer: Medicare Other | Admitting: Adult Health

## 2019-03-31 VITALS — BP 106/70 | HR 63 | Temp 98.1°F | Wt 171.0 lb

## 2019-03-31 DIAGNOSIS — M25511 Pain in right shoulder: Secondary | ICD-10-CM | POA: Diagnosis not present

## 2019-03-31 DIAGNOSIS — Z96611 Presence of right artificial shoulder joint: Secondary | ICD-10-CM

## 2019-03-31 DIAGNOSIS — R0789 Other chest pain: Secondary | ICD-10-CM

## 2019-03-31 DIAGNOSIS — W19XXXA Unspecified fall, initial encounter: Secondary | ICD-10-CM

## 2019-03-31 NOTE — Progress Notes (Signed)
Assessment and Plan:  Crystal [name] was seen today for fall.  Diagnoses and all orders for this visit:  Acute pain of right shoulder/History of arthroplasty of right shoulder I have low suspicion of bony abnormality; primarily suggestive of simple strain However, patient is extremely anxious regarding recent R shoulder arthroplasty and strongly prefers to proceed with imaging, order placed Advised to avoid lifting with shoulder x 2-4 weeks, the gradually increase as tolerated She may utilize OTC XS tylenol 1-2 tabs TID PRN Follow up with ortho for follow up as scheduled later this month  -     DG Shoulder Right; Future  Mechanical fall, initial encounter -     DG Shoulder Right; Future  Costochondral chest pain       Chest tenderness most consistent with costochondritis, tender without palpable abnormality, non-exertional without accompaniments       Should resolve with rest, may apply ice PRN, OTC Tylenol  Follow up if any cough, dyspnea, worsening pain   Further disposition pending results of labs. Discussed med's effects and SE's.   Over 15 minutes of exam, counseling, chart review, and critical decision making was performed.   Future Appointments  Date Time Provider Department Center  05/12/2019 11:20 AM [facility] 3 [facility]  07/05/2019 11:00 AM McClanahan, Bella Kennedy, NP [facility] None  10/25/2019 11:15 AM Judd Gaudier, NP [facility] None  03/20/2020 10:00 AM [name], MD [facility] None    ------------------------------------------------------------------------------------------------------------------   HPI BP 106/70   Pulse 63   Temp 98.1 F (36.7 C)   Wt 171 lb (77.6 kg)   SpO2 97%   BMI 30.53 kg/m   68 y.o.female presents for evaluation of chest/breast tenderness and R shoulder pain with movement following a fall last week on 03/21/2019;   she reports she was out in her yard, tripped and fell forward to land on her chest and R shoulder (denies head  injury, neck pain) wasn't able to put her hands out to catch herself but has had persistent mid/R sided chest pain/tenderness, worse with deep breaths but denies dyspnea, cough. Has needed to brace with a pillow to roll over or sit up. Tender through breast.    R shoulder with anterior pain with certain movements; non-radiating; has with abrupt movements with lateral abduction. Describes as brief sharp pain. She is concerned as recently had reverse ball joint replacement on 11/15/2018 by Dr. Deberah Pelton at [facility].   She is on gabapentin, cymbalta for widespread ortho pain, did take a few leftover tramadol from surgery, didn't try tylenol, has been advised not to take NSAIDs due to Crohn's history.     Past Medical History:  Diagnosis Date  . Arthritis   . ASCVD (arteriosclerotic cardiovascular disease)   . Closed nondisplaced subtrochanteric fracture of left femur (HCC) 09/03/2017   September 2018.  Status post surgery by Dr. Carola Frost  . Depression   . Fibromyalgia   . Gait disorder 10/26/2012  . GERD (gastroesophageal reflux disease)   . GI bleed   . Hyperlipidemia   . Hypertension   . Hypothyroid   . Periprosthetic supracondylar fracture of femur, left  03/23/2017  . Restless leg syndrome   . Rhinitis 06/25/2010  . Sleep apnea   . TIA (transient ischemic attack)    3        . Vitamin D deficiency      Allergies  Allergen Reactions  . Atorvastatin Other (See Comments)    Extreme pain  . Codeine Other (See Comments)  Headache  . Pravastatin Other (See Comments)    Exreme pain  . Statins Other (See Comments)    Extreme pain  . Amitriptyline Other (See Comments)    Unspecified  . Fish Oil Nausea Only  . Linseed Oil Other (See Comments)    Other Reaction: Other reaction  . Nuvigil [Armodafinil] Other (See Comments)    Unspecified  . Sulfa Antibiotics Other (See Comments)  . Erythromycin Other (See Comments)    Reaction unspecified  . Erythromycin Base Rash  . Flax Seed  [Bio-Flax] Rash    Linseed  . Hydrocodone-Acetaminophen Rash  . Morphine Nausea And Vomiting and Rash  . Nsaids Nausea Only  . Sertraline Rash  . Sinemet [Carbidopa W-Levodopa] Rash  . Sulfamethoxazole Rash    Current Outpatient Medications on File Prior to Visit  Medication Sig  . acyclovir (ZOVIRAX) 400 MG tablet TAKE 1 TABLET (400 MG TOTAL) BY MOUTH 3 TIMES DAILY FOR 10 DAYS. TAKE 1 PILL 2 TIMES DAILY WITH FOOD  . Adalimumab 40 MG/0.4ML PNKT Inject into the skin.  [name] aspirin EC 81 MG tablet Take 81 mg by mouth daily.  . benzonatate (TESSALON) 200 MG capsule Take 1 cap three times daily as needed for cough.  [name] BIOTIN PO Take by mouth.  . bisoprolol-hydrochlorothiazide (ZIAC) 5-6.25 MG tablet TAKE 1 TABLET BY MOUTH TWICE A DAY  . buPROPion (WELLBUTRIN XL) 300 MG 24 hr tablet Take 1 tablet daily for mood and focus.  . Cyanocobalamin (B-12 SL) Place under the tongue daily.  . cyclobenzaprine (FLEXERIL) 5 MG tablet Take 1/2 to 1 tablet 2 to 3 x /day ONLY if needed for Muscle Spasm  . diclofenac sodium (VOLTAREN) 1 % GEL Apply 2 g topically 2 (two) times daily as needed.   [name] DIGESTIVE ENZYMES PO Take by mouth 2 (two) times daily.  . DULoxetine (CYMBALTA) 60 MG capsule Take 1 capsule Daily for Chronic Pain  . famotidine (PEPCID) 40 MG tablet Take 40 mg by mouth daily.  . fenofibrate micronized (LOFIBRA) 134 MG capsule TAKE 1 CAPSULE BY MOUTH EVERY DAY BEFORE BREAKFAST  . ferrous sulfate 325 (65 FE) MG tablet Take 325 mg by mouth daily.   . fluconazole (DIFLUCAN) 100 MG tablet Take 100 mg by mouth daily.  . fluticasone (FLONASE) 50 MCG/ACT nasal spray USE TWO SPRAYS IN EACH NOSTRIL DAILY AS NEEDED  . gabapentin (NEURONTIN) 300 MG capsule Take 1 capsule 3 x/day for Chronic Pain  . levothyroxine (SYNTHROID, LEVOTHROID) 100 MCG tablet TAKE 1 TABLET BY MOUTH ON TUES, WED, THURS, SAT AND SUN. TAKE 1& 1/2 TABLET ON MON AND FRI (Patient taking differently: Take one tablet daily)  . lidocaine  (XYLOCAINE) 2 % jelly apply small amount topically to affected area 2-3 times a day  . meclizine (ANTIVERT) 25 MG tablet TAKE 1 TABLET (25 MG TOTAL) BY MOUTH 3 (THREE) TIMES DAILY AS NEEDED FOR DIZZINESS.  [name] NEOMYCIN-POLYMYXIN-HYDROCORTISONE (CORTISPORIN) 1 % SOLN OTIC solution Place 3 drops into both ears 4 (four) times daily.  [name] nystatin (MYCOSTATIN) 100000 UNIT/ML suspension KEEP 5 ML IN MOUTH AS LONG AS POSSIBLE 4 TIMES A DAY(SWISH & SPIT)USE 48 HRS AFTER SYMPTOMS RESOLVE  . ondansetron (ZOFRAN ODT) 4 MG disintegrating tablet Take 1 tablet (4 mg total) by mouth every 8 (eight) hours as needed for nausea or vomiting.  [name] oxybutynin (DITROPAN) 5 MG tablet Take 1 tablet 2 x /day for Bladder Control  . Phenylephrine-guaiFENesin (MUCUS RELIEF D PO) Take by mouth daily.  [name]  polyethylene glycol (MIRALAX / GLYCOLAX) packet Take 17 g by mouth daily as needed for mild constipation.  [name] PREBIOTIC PRODUCT PO Take by mouth.  . Probiotic Product (PROBIOTIC DAILY PO) Take by mouth daily.  . promethazine-dextromethorphan (PROMETHAZINE-DM) 6.25-15 MG/5ML syrup Use 1 to 2 teaspoonful every 4 hours if needed for severe cough  . Respiratory Therapy Supplies (FLUTTER) DEVI Use flutter valve 3 times a day  . rOPINIRole (REQUIP) 2 MG tablet Take 1 tablet 4 x /day for Restless Legs  . TRAMADOL HCL ER PO Take by mouth as needed.   . Vitamin D, Ergocalciferol, (DRISDOL) 1.25 MG (50000 UT) CAPS capsule Take 1 capsule Daily for Severe Vitamin Deficiency (Patient taking differently: 3 (three) times a week. Take 1 capsule Daily for Severe Vitamin Deficiency)  . Wheat Dextrin (BENEFIBER PO) Take by mouth.   No current facility-administered medications on file prior to visit.     ROS: all negative except above.   Physical Exam:  BP 106/70   Pulse 63   Temp 98.1 F (36.7 C)   Wt 171 lb (77.6 kg)   SpO2 97%   BMI 30.53 kg/m   General Appearance: Well nourished, in no apparent distress. Eyes: PERRLA, conjunctiva no  swelling or erythema Sinuses: No Frontal/maxillary tenderness ENT/Mouth: Hearing normal.  Neck: Supple, thyroid normal.  Respiratory: Respiratory effort normal, BS equal bilaterally without rales, rhonchi, wheezing or stridor.  Cardio: RRR with no MRGs. Brisk peripheral pulses without edema.  Abdomen: Soft, + BS.  Non tender, no guarding, rebound, hernias, masses. Lymphatics: Non tender without lymphadenopathy.  Musculoskeletal: R shoulder with full active and passive ROM excepting extension, per patient ongoing following surgery; no catching, popping; no tenderness over clavicle, acromion; some tenderness over proximal humerus, biceps, anterior deltoid. She has generalized tenderness of R chest over costochondral junctions. NO palpable bony abnormality.  Skin: Warm, dry without rashes, lesions, ecchymosis.  Neuro: Normal muscle tone, Sensation intact.  Psych: Awake and oriented X 3, anxious affect, Insight and Judgment appropriate.     Izora Ribas, NP 11:47 AM [facility]

## 2019-03-31 NOTE — Patient Instructions (Signed)
Ok to take tylenol [phone] three times daily as needed  Avoid straining/lifting excessively with R shoulder for the next weeks    Shoulder Pain Many things can cause shoulder pain, including:  An injury to the shoulder.  Overuse of the shoulder.  Arthritis. The source of the pain can be:  Inflammation.  An injury to the shoulder joint.  An injury to a tendon, ligament, or bone. Follow these instructions at home: Pay attention to changes in your symptoms. Let your health care provider know about them. Follow these instructions to relieve your pain. If you have a sling:  Wear the sling as told by your health care provider. Remove it only as told by your health care provider.  Loosen the sling if your fingers tingle, become numb, or turn cold and blue.  Keep the sling clean.  If the sling is not waterproof: ? Do not let it get wet. Remove it to shower or bathe.  Move your arm as little as possible, but keep your hand moving to prevent swelling. Managing pain, stiffness, and swelling   If directed, put ice on the painful area: ? Put ice in a plastic bag. ? Place a towel between your skin and the bag. ? Leave the ice on for 20 minutes, 2-3 times per day. Stop applying ice if it does not help with the pain.  Squeeze a soft ball or a foam pad as much as possible. This helps to keep the shoulder from swelling. It also helps to strengthen the arm. General instructions  Take over-the-counter and prescription medicines only as told by your health care provider.  Keep all follow-up visits as told by your health care provider. This is important. Contact a health care provider if:  Your pain gets worse.  Your pain is not relieved with medicines.  New pain develops in your arm, hand, or fingers. Get help right away if:  Your arm, hand, or fingers: ? Tingle. ? Become numb. ? Become swollen. ? Become painful. ? Turn white or blue. Summary  Shoulder pain can be caused  by an injury, overuse, or arthritis.  Pay attention to changes in your symptoms. Let your health care provider know about them.  This condition may be treated with a sling, ice, and pain medicines.  Contact your health care provider if the pain gets worse or new pain develops. Get help right away if your arm, hand, or fingers tingle or become numb, swollen, or painful.  Keep all follow-up visits as told by your health care provider. This is important. This information is not intended to replace advice given to you by your health care provider. Make sure you discuss any questions you have with your health care provider. Document Released: 03/26/2005 Document Revised: 12/29/2017 Document Reviewed: 12/29/2017 Elsevier Patient Education  2020 [employer].

## 2019-04-12 ENCOUNTER — Other Ambulatory Visit: Payer: Self-pay | Admitting: Internal Medicine

## 2019-04-12 ENCOUNTER — Other Ambulatory Visit: Payer: Self-pay | Admitting: Physician Assistant

## 2019-04-19 ENCOUNTER — Ambulatory Visit: Payer: Medicare Other

## 2019-04-25 ENCOUNTER — Encounter (INDEPENDENT_AMBULATORY_CARE_PROVIDER_SITE_OTHER): Payer: Self-pay

## 2019-05-09 ENCOUNTER — Other Ambulatory Visit: Payer: Self-pay

## 2019-05-09 ENCOUNTER — Encounter: Payer: Self-pay | Admitting: Podiatry

## 2019-05-09 ENCOUNTER — Ambulatory Visit (INDEPENDENT_AMBULATORY_CARE_PROVIDER_SITE_OTHER): Payer: Medicare Other | Admitting: Podiatry

## 2019-05-09 ENCOUNTER — Ambulatory Visit (INDEPENDENT_AMBULATORY_CARE_PROVIDER_SITE_OTHER): Payer: Medicare Other

## 2019-05-09 DIAGNOSIS — M779 Enthesopathy, unspecified: Secondary | ICD-10-CM

## 2019-05-09 DIAGNOSIS — M778 Other enthesopathies, not elsewhere classified: Secondary | ICD-10-CM

## 2019-05-09 NOTE — Progress Notes (Signed)
Subjective:   Patient ID: Crystal [name], female   DOB: 68 y.o.   MRN: [id number]   HPI Patient presents stating she is getting a lot of pain on top of both her feet and also in the outside of the top of both her feet which seems different than before   ROS      Objective:  Physical Exam  Neurovascular status intact with discomfort which is more in the dorsal extensor complex extending into the lateral extensor complex and into the peroneal tertius complex     Assessment:  Chronic tendinitis extending in a more lateral direction     Plan:  Sterile prep and I then went ahead and injected the tendon complex bilateral 3 mg Dexasone Kenalog 5 mg Xylocaine applied sterile dressings and advised on reduced activity.  Reappoint to recheck

## 2019-05-12 ENCOUNTER — Other Ambulatory Visit: Payer: Self-pay

## 2019-05-12 ENCOUNTER — Ambulatory Visit
Admission: RE | Admit: 2019-05-12 | Discharge: 2019-05-12 | Disposition: A | Discharge status: Home / Self Care | Payer: Medicare Other | Source: Ambulatory Visit | Attending: Internal Medicine | Admitting: Internal Medicine

## 2019-05-12 DIAGNOSIS — Z1231 Encounter for screening mammogram for malignant neoplasm of breast: Secondary | ICD-10-CM

## 2019-05-15 ENCOUNTER — Other Ambulatory Visit: Payer: Self-pay | Admitting: Internal Medicine

## 2019-05-15 ENCOUNTER — Other Ambulatory Visit: Payer: Self-pay | Admitting: Physician Assistant

## 2019-05-15 DIAGNOSIS — M797 Fibromyalgia: Secondary | ICD-10-CM

## 2019-05-15 DIAGNOSIS — I10 Essential (primary) hypertension: Secondary | ICD-10-CM

## 2019-05-17 ENCOUNTER — Telehealth: Payer: Self-pay | Admitting: *Deleted

## 2019-05-17 DIAGNOSIS — Z96651 Presence of right artificial knee joint: Secondary | ICD-10-CM | POA: Insufficient documentation

## 2019-05-17 DIAGNOSIS — T8484XA Pain due to internal orthopedic prosthetic devices, implants and grafts, initial encounter: Secondary | ICD-10-CM | POA: Insufficient documentation

## 2019-05-17 NOTE — Telephone Encounter (Signed)
Pt states she saw Dr. Paulla Dolly about 2 weeks ago and had sutures taken out of her fingers and they have raised black spots with yellow below.

## 2019-05-17 NOTE — Telephone Encounter (Signed)
I called pt and asked if I could help her with her sutures toes. Pt states she had called the wrong doctor's office and was waiting for a response from that office. I told her to call again with any concerns.

## 2019-05-20 ENCOUNTER — Other Ambulatory Visit: Payer: Self-pay | Admitting: Internal Medicine

## 2019-05-20 MED ORDER — TIZANIDINE HCL 2 MG PO TABS: ORAL_TABLET | ORAL | 1 refills | Status: DC

## 2019-05-25 ENCOUNTER — Ambulatory Visit: Payer: Medicare Other | Admitting: Physician Assistant

## 2019-07-05 ENCOUNTER — Encounter: Payer: Self-pay | Admitting: Adult Health Nurse Practitioner

## 2019-07-05 ENCOUNTER — Ambulatory Visit: Payer: Medicare PPO | Admitting: Adult Health Nurse Practitioner

## 2019-07-05 ENCOUNTER — Other Ambulatory Visit: Payer: Self-pay

## 2019-07-05 VITALS — BP 136/82 | HR 85 | Temp 97.8°F | Wt 165.0 lb

## 2019-07-05 DIAGNOSIS — Z7409 Other reduced mobility: Secondary | ICD-10-CM

## 2019-07-05 DIAGNOSIS — I2510 Atherosclerotic heart disease of native coronary artery without angina pectoris: Secondary | ICD-10-CM

## 2019-07-05 DIAGNOSIS — J40 Bronchitis, not specified as acute or chronic: Secondary | ICD-10-CM

## 2019-07-05 DIAGNOSIS — B370 Candidal stomatitis: Secondary | ICD-10-CM

## 2019-07-05 DIAGNOSIS — Z96611 Presence of right artificial shoulder joint: Secondary | ICD-10-CM

## 2019-07-05 DIAGNOSIS — E039 Hypothyroidism, unspecified: Secondary | ICD-10-CM

## 2019-07-05 DIAGNOSIS — E782 Mixed hyperlipidemia: Secondary | ICD-10-CM | POA: Diagnosis not present

## 2019-07-05 DIAGNOSIS — R350 Frequency of micturition: Secondary | ICD-10-CM

## 2019-07-05 DIAGNOSIS — M180 Bilateral primary osteoarthritis of first carpometacarpal joints: Secondary | ICD-10-CM | POA: Diagnosis not present

## 2019-07-05 DIAGNOSIS — E1169 Type 2 diabetes mellitus with other specified complication: Secondary | ICD-10-CM

## 2019-07-05 DIAGNOSIS — M797 Fibromyalgia: Secondary | ICD-10-CM

## 2019-07-05 DIAGNOSIS — M19049 Primary osteoarthritis, unspecified hand: Secondary | ICD-10-CM | POA: Diagnosis not present

## 2019-07-05 DIAGNOSIS — Z96651 Presence of right artificial knee joint: Secondary | ICD-10-CM

## 2019-07-05 DIAGNOSIS — M79642 Pain in left hand: Secondary | ICD-10-CM | POA: Diagnosis not present

## 2019-07-05 DIAGNOSIS — I10 Essential (primary) hypertension: Secondary | ICD-10-CM | POA: Diagnosis not present

## 2019-07-05 DIAGNOSIS — K5000 Crohn's disease of small intestine without complications: Secondary | ICD-10-CM | POA: Diagnosis not present

## 2019-07-05 DIAGNOSIS — E559 Vitamin D deficiency, unspecified: Secondary | ICD-10-CM

## 2019-07-05 DIAGNOSIS — G4733 Obstructive sleep apnea (adult) (pediatric): Secondary | ICD-10-CM

## 2019-07-05 DIAGNOSIS — E785 Hyperlipidemia, unspecified: Secondary | ICD-10-CM

## 2019-07-05 DIAGNOSIS — M25642 Stiffness of left hand, not elsewhere classified: Secondary | ICD-10-CM | POA: Diagnosis not present

## 2019-07-05 DIAGNOSIS — G2581 Restless legs syndrome: Secondary | ICD-10-CM

## 2019-07-05 DIAGNOSIS — R0789 Other chest pain: Secondary | ICD-10-CM

## 2019-07-05 DIAGNOSIS — K219 Gastro-esophageal reflux disease without esophagitis: Secondary | ICD-10-CM

## 2019-07-05 DIAGNOSIS — D513 Other dietary vitamin B12 deficiency anemia: Secondary | ICD-10-CM

## 2019-07-05 DIAGNOSIS — R2689 Other abnormalities of gait and mobility: Secondary | ICD-10-CM

## 2019-07-05 DIAGNOSIS — R682 Dry mouth, unspecified: Secondary | ICD-10-CM

## 2019-07-05 NOTE — Patient Instructions (Addendum)
Vit D  & Vit C 1,000 mg   are recommended to help protect  against the Covid-19 and other Corona viruses.    Also it's recommended  to take  Zinc 50 mg  to help  protect against the Covid-19   and best place to get  is also on [employer]  and don't pay more than 6-8 cents /pill !  ================================ Coronavirus (COVID-19) Are you at risk?  Are you at risk for the Coronavirus (COVID-19)?  To be considered HIGH RISK for Coronavirus (COVID-19), you have to meet the following criteria:  . Traveled to [place], [place], [place], [place] or [place]; or in the [place] to [place], [place], [place]  . or [place]; and have fever, cough, and shortness of breath within the last 2 weeks of travel OR . Been in close contact with a person diagnosed with COVID-19 within the last 2 weeks and have  . fever, cough,and shortness of breath .  . IF YOU DO NOT MEET THESE CRITERIA, YOU ARE CONSIDERED LOW RISK FOR COVID-19.  What to do if you are HIGH RISK for COVID-19?  . If you are having a medical emergency, call 911. . Seek medical care right away. Before you go to a doctor's office, urgent care or emergency department, .  call ahead and tell them about your recent travel, contact with someone diagnosed with COVID-19  .  and your symptoms.  . You should receive instructions from your physician's office regarding next steps of care.  . When you arrive at healthcare provider, tell the healthcare staff immediately you have returned from  . visiting [place], [place], [place], [place] or [place]; or traveled in the [place] to [place], [place],  . [place] or [place] in the last two weeks or you have been in close contact with a person diagnosed with  . COVID-19 in the last 2 weeks.   . Tell the health care staff about your symptoms: fever, cough and shortness of breath. . After you have been seen by a medical provider, you will be either: o Tested for (COVID-19) and  discharged home on quarantine except to seek medical care if  o symptoms worsen, and asked to  - Stay home and avoid contact with others until you get your results (4-5 days)  - Avoid travel on public transportation if possible (such as bus, train, or airplane) or o Sent to the Emergency Department by EMS for evaluation, COVID-19 testing  and  o possible admission depending on your condition and test results.  What to do if you are LOW RISK for COVID-19?  Reduce your risk of any infection by using the same precautions used for avoiding the common cold or flu:  . Wash your hands often with soap and warm water for at least 20 seconds.  If soap and water are not readily available,  . use an alcohol-based hand sanitizer with at least 60% alcohol.  . If coughing or sneezing, cover your mouth and nose by coughing or sneezing into the elbow areas of your shirt or coat, .  into a tissue or into your sleeve (not your hands). . Avoid shaking hands with others and consider head nods or verbal greetings only. . Avoid touching your eyes, nose, or mouth with unwashed hands.  . Avoid close contact with people who are sick. . Avoid places or events with large numbers of people in one location, like concerts or sporting events. .   Carefully consider travel plans you have or are making. . If you are planning any travel outside or inside the [place], visit the CDC's Travelers' Health webpage for the latest health notices. . If you have some symptoms but not all symptoms, continue to monitor at home and seek medical attention  . if your symptoms worsen. . If you are having a medical emergency, call 911.   . >>>>>>>>>>>>>>>>>>>>>>>>>>>>>>>>> . We Do NOT Approve of  [facility] [place] Soliciting Our Patients  To Do Home Visits & We Do NOT Approve of [employer] > > > > > > > > > > > > > > > > > > > > > > > > > > > > > > > > > > > > > > >  Preventive Care for Adults  A healthy lifestyle  and preventive care can promote health and wellness. Preventive health guidelines for women include the following key practices.  A routine yearly physical is a good way to check with your health care provider about your health and preventive screening. It is a chance to share any concerns and updates on your health and to receive a thorough exam.  Visit your dentist for a routine exam and preventive care every 6 months. Brush your teeth twice a day and floss once a day. Good oral hygiene prevents tooth decay and gum disease.  The frequency of eye exams is based on your age, health, family medical history, use of contact lenses, and other factors. Follow your health care provider's recommendations for frequency of eye exams.  Eat a healthy diet. Foods like vegetables, fruits, whole grains, low-fat dairy products, and lean protein foods contain the nutrients you need without too many calories. Decrease your intake of foods high in solid fats, added sugars, and salt. Eat the right amount of calories for you. Get information about a proper diet from your health care provider, if necessary.  Regular physical exercise is one of the most important things you can do for your health. Most adults should get at least 150 minutes of moderate-intensity exercise (any activity that increases your heart rate and causes you to sweat) each week. In addition, most adults need muscle-strengthening exercises on 2 or more days a week.  Maintain a healthy weight. The body mass index (BMI) is a screening tool to identify possible weight problems. It provides an estimate of body fat based on height and weight. Your health care provider can find your BMI and can help you achieve or maintain a healthy weight. For adults 20 years and older:  A BMI below 18.5 is considered underweight.  A BMI of 18.5 to 24.9 is normal.  A BMI of 25 to 29.9 is considered overweight.  A BMI of 30 and above is considered obese.  Maintain  normal blood lipids and cholesterol levels by exercising and minimizing your intake of saturated fat. Eat a balanced diet with plenty of fruit and vegetables. If your lipid or cholesterol levels are high, you are over 50, or you are at high risk for heart disease, you may need your cholesterol levels checked more frequently. Ongoing high lipid and cholesterol levels should be treated with medicines if diet and exercise are not working.  If you smoke, find out from your health care provider how to quit. If you do not use tobacco, do not start.  Lung cancer screening is recommended for adults aged 55-80 years who are at high risk for developing lung cancer   because of a history of smoking. A yearly low-dose CT scan of the lungs is recommended for people who have at least a 30-pack-year history of smoking and are a current smoker or have quit within the past 15 years. A pack year of smoking is smoking an average of 1 pack of cigarettes a day for 1 year (for example: 1 pack a day for 30 years or 2 packs a day for 15 years). Yearly screening should continue until the smoker has stopped smoking for at least 15 years. Yearly screening should be stopped for people who develop a health problem that would prevent them from having lung cancer treatment.  Avoid use of street drugs. Do not share needles with anyone. Ask for help if you need support or instructions about stopping the use of drugs.  High blood pressure causes heart disease and increases the risk of stroke.  Ongoing high blood pressure should be treated with medicines if weight loss and exercise do not work.  If you are 55-79 years old, ask your health care provider if you should take aspirin to prevent strokes.  Diabetes screening involves taking a blood sample to check your fasting blood sugar level. This should be done once every 3 years, after age 45, if you are within normal weight and without risk factors for diabetes. Testing should be considered  at a younger age or be carried out more frequently if you are overweight and have at least 1 risk factor for diabetes.  Breast cancer screening is essential preventive care for women. You should practice "breast self-awareness." This means understanding the normal appearance and feel of your breasts and may include breast self-examination. Any changes detected, no matter how small, should be reported to a health care provider. Women in their 20s and 30s should have a clinical breast exam (CBE) by a health care provider as part of a regular health exam every 1 to 3 years. After age 40, women should have a CBE every year. Starting at age 40, women should consider having a mammogram (breast X-ray test) every year. Women who have a family history of breast cancer should talk to their health care provider about genetic screening. Women at a high risk of breast cancer should talk to their health care providers about having an MRI and a mammogram every year.  Breast cancer gene (BRCA)-related cancer risk assessment is recommended for women who have family members with BRCA-related cancers. BRCA-related cancers include breast, ovarian, tubal, and peritoneal cancers. Having family members with these cancers may be associated with an increased risk for harmful changes (mutations) in the breast cancer genes BRCA1 and BRCA2. Results of the assessment will determine the need for genetic counseling and BRCA1 and BRCA2 testing.  Routine pelvic exams to screen for cancer are no longer recommended for nonpregnant women who are considered low risk for cancer of the pelvic organs (ovaries, uterus, and vagina) and who do not have symptoms. Ask your health care provider if a screening pelvic exam is right for you.  If you have had past treatment for cervical cancer or a condition that could lead to cancer, you need Pap tests and screening for cancer for at least 20 years after your treatment. If Pap tests have been discontinued,  your risk factors (such as having a new sexual partner) need to be reassessed to determine if screening should be resumed. Some women have medical problems that increase the chance of getting cervical cancer. In these cases, your health care   provider may recommend more frequent screening and Pap tests.    Colorectal cancer can be detected and often prevented. Most routine colorectal cancer screening begins at the age of 50 years and continues through age 75 years. However, your health care provider may recommend screening at an earlier age if you have risk factors for colon cancer. On a yearly basis, your health care provider may provide home test kits to check for hidden blood in the stool. Use of a small camera at the end of a tube, to directly examine the colon (sigmoidoscopy or colonoscopy), can detect the earliest forms of colorectal cancer. Talk to your health care provider about this at age 50, when routine screening begins.  Direct exam of the colon should be repeated every 5-10 years through age 75 years, unless early forms of pre-cancerous polyps or small growths are found.  Osteoporosis is a disease in which the bones lose minerals and strength with aging. This can result in serious bone fractures or breaks. The risk of osteoporosis can be identified using a bone density scan. Women ages 65 years and over and women at risk for fractures or osteoporosis should discuss screening with their health care providers. Ask your health care provider whether you should take a calcium supplement or vitamin D to reduce the rate of osteoporosis.  Menopause can be associated with physical symptoms and risks. Hormone replacement therapy is available to decrease symptoms and risks. You should talk to your health care provider about whether hormone replacement therapy is right for you.  Use sunscreen. Apply sunscreen liberally and repeatedly throughout the day. You should seek shade when your shadow is shorter  than you. Protect yourself by wearing long sleeves, pants, a wide-brimmed hat, and sunglasses year round, whenever you are outdoors.  Once a month, do a whole body skin exam, using a mirror to look at the skin on your back. Tell your health care provider of new moles, moles that have irregular borders, moles that are larger than a pencil eraser, or moles that have changed in shape or color.  Stay current with required vaccines (immunizations).  Influenza vaccine. All adults should be immunized every year.  Tetanus, diphtheria, and acellular pertussis (Td, Tdap) vaccine. Pregnant women should receive 1 dose of Tdap vaccine during each pregnancy. The dose should be obtained regardless of the length of time since the last dose. Immunization is preferred during the 27th-36th week of gestation. An adult who has not previously received Tdap or who does not know her vaccine status should receive 1 dose of Tdap. This initial dose should be followed by tetanus and diphtheria toxoids (Td) booster doses every 10 years. Adults with an unknown or incomplete history of completing a 3-dose immunization series with Td-containing vaccines should begin or complete a primary immunization series including a Tdap dose. Adults should receive a Td booster every 10 years.    Zoster vaccine. One dose is recommended for adults aged 60 years or older unless certain conditions are present.    Pneumococcal 13-valent conjugate (PCV13) vaccine. When indicated, a person who is uncertain of her immunization history and has no record of immunization should receive the PCV13 vaccine. An adult aged 19 years or older who has certain medical conditions and has not been previously immunized should receive 1 dose of PCV13 vaccine. This PCV13 should be followed with a dose of pneumococcal polysaccharide (PPSV23) vaccine. The PPSV23 vaccine dose should be obtained at least 1 or more year(s) after the dose of   PCV13 vaccine. An adult aged 19  years or older who has certain medical conditions and previously received 1 or more doses of PPSV23 vaccine should receive 1 dose of PCV13. The PCV13 vaccine dose should be obtained 1 or more years after the last PPSV23 vaccine dose.    Pneumococcal polysaccharide (PPSV23) vaccine. When PCV13 is also indicated, PCV13 should be obtained first. All adults aged 65 years and older should be immunized. An adult younger than age 65 years who has certain medical conditions should be immunized. Any person who resides in a nursing home or long-term care facility should be immunized. An adult smoker should be immunized. People with an immunocompromised condition and certain other conditions should receive both PCV13 and PPSV23 vaccines. People with human immunodeficiency virus (HIV) infection should be immunized as soon as possible after diagnosis. Immunization during chemotherapy or radiation therapy should be avoided. Routine use of PPSV23 vaccine is not recommended for American Indians, [place] Natives, or people younger than 65 years unless there are medical conditions that require PPSV23 vaccine. When indicated, people who have unknown immunization and have no record of immunization should receive PPSV23 vaccine. One-time revaccination 5 years after the first dose of PPSV23 is recommended for people aged 19-64 years who have chronic kidney failure, nephrotic syndrome, asplenia, or immunocompromised conditions. People who received 1-2 doses of PPSV23 before age 65 years should receive another dose of PPSV23 vaccine at age 65 years or later if at least 5 years have passed since the previous dose. Doses of PPSV23 are not needed for people immunized with PPSV23 at or after age 65 years.   Preventive Services / Frequency  Ages 65 years and over  Blood pressure check.  Lipid and cholesterol check.  Lung cancer screening. / Every year if you are aged 55-80 years and have a 30-pack-year history of smoking and  currently smoke or have quit within the past 15 years. Yearly screening is stopped once you have quit smoking for at least 15 years or develop a health problem that would prevent you from having lung cancer treatment.  Clinical breast exam.** / Every year after age 40 years.   BRCA-related cancer risk assessment.** / For women who have family members with a BRCA-related cancer (breast, ovarian, tubal, or peritoneal cancers).  Mammogram.** / Every year beginning at age 40 years and continuing for as long as you are in good health. Consult with your health care provider.  Pap test.** / Every 3 years starting at age 30 years through age 65 or 70 years with 3 consecutive normal Pap tests. Testing can be stopped between 65 and 70 years with 3 consecutive normal Pap tests and no abnormal Pap or HPV tests in the past 10 years.  Fecal occult blood test (FOBT) of stool. / Every year beginning at age 50 years and continuing until age 75 years. You may not need to do this test if you get a colonoscopy every 10 years.  Flexible sigmoidoscopy or colonoscopy.** / Every 5 years for a flexible sigmoidoscopy or every 10 years for a colonoscopy beginning at age 50 years and continuing until age 75 years.  Hepatitis C blood test.** / For all people born from 1945 through 1965 and any individual with known risks for hepatitis C.  Osteoporosis screening.** / A one-time screening for women ages 65 years and over and women at risk for fractures or osteoporosis.  Skin self-exam. / Monthly.  Influenza vaccine. / Every year.  Tetanus, diphtheria, and acellular   pertussis (Tdap/Td) vaccine.** / 1 dose of Td every 10 years.  Zoster vaccine.** / 1 dose for adults aged 60 years or older.  Pneumococcal 13-valent conjugate (PCV13) vaccine.** / Consult your health care provider.  Pneumococcal polysaccharide (PPSV23) vaccine.** / 1 dose for all adults aged 65 years and older. Screening for abdominal aortic aneurysm (AAA)   by ultrasound is recommended for people who have history of high blood pressure or who are current or former smokers. ++++++++++++++++++++ Recommend Adult Low Dose Aspirin or  coated  Aspirin 81 mg daily  To reduce risk of Colon Cancer 40 %,  Skin Cancer 26 % ,  Melanoma 46%  and  Pancreatic cancer 60% ++++++++++++++++++++ Vitamin D goal  is between 70-100.  Please make sure that you are taking your Vitamin D as directed.  It is very important as a natural anti-inflammatory  helping hair, skin, and nails, as well as reducing stroke and heart attack risk.  It helps your bones and helps with mood. It also decreases numerous cancer risks so please take it as directed.  Low Vit D is associated with a 200-300% higher risk for CANCER  and 200-300% higher risk for HEART   ATTACK  &  STROKE.   ...................................... It is also associated with higher death rate at younger ages,  autoimmune diseases like Rheumatoid arthritis, Lupus, Multiple Sclerosis.    Also many other serious conditions, like depression, Alzheimer's Dementia, infertility, muscle aches, fatigue, fibromyalgia - just to name a few. ++++++++++++++++++ Recommend the book "The END of DIETING" by Dr Joel Fuhrman  & the book "The END of DIABETES " by Dr Joel Fuhrman At [employer] - get book & Audio CD's    Being diabetic has a  300% increased risk for heart attack, stroke, cancer, and alzheimer- type vascular dementia. It is very important that you work harder with diet by avoiding all foods that are white. Avoid white rice (brown & wild rice is OK), white potatoes (sweetpotatoes in moderation is OK), White bread or wheat bread or anything made out of white flour like bagels, donuts, rolls, buns, biscuits, cakes, pastries, cookies, pizza crust, and pasta (made from white flour & egg whites) - vegetarian pasta or spinach or wheat pasta is OK. Multigrain breads like Arnold's or Pepperidge [employer], or multigrain sandwich thins  or flatbreads.  Diet, exercise and weight loss can reverse and cure diabetes in the early stages.  Diet, exercise and weight loss is very important in the control and prevention of complications of diabetes which affects every system in your body, ie. Brain - dementia/stroke, eyes - glaucoma/blindness, heart - heart attack/heart failure, kidneys - dialysis, stomach - gastric paralysis, intestines - malabsorption, nerves - severe painful neuritis, circulation - gangrene & loss of a leg(s), and finally cancer and Alzheimers.    I recommend avoid fried & greasy foods,  sweets/candy, white rice (brown or wild rice or Quinoa is OK), white potatoes (sweet potatoes are OK) - anything made from white flour - bagels, doughnuts, rolls, buns, biscuits,white and wheat breads, pizza crust and traditional pasta made of white flour & egg white(vegetarian pasta or spinach or wheat pasta is OK).  Multi-grain bread is OK - like multi-grain flat bread or sandwich thins. Avoid alcohol in excess. Exercise is also important.    Eat all the vegetables you want - avoid meat, especially red meat and dairy - especially cheese.  Cheese is the most concentrated form of trans-fats which is the worst thing to clog   up our arteries. Veggie cheese is OK which can be found in the fresh produce section at [employer] or [employer] or [employer]  +++++++++++++++++++ DASH Eating Plan  DASH stands for "Dietary Approaches to Stop Hypertension."   The DASH eating plan is a healthy eating plan that has been shown to reduce high blood pressure (hypertension). Additional health benefits may include reducing the risk of type 2 diabetes mellitus, heart disease, and stroke. The DASH eating plan may also help with weight loss. WHAT DO I NEED TO KNOW ABOUT THE DASH EATING PLAN? For the DASH eating plan, you will follow these general guidelines:  Choose foods with a percent daily value for sodium of less than 5% (as listed on the food  label).  Use salt-free seasonings or herbs instead of table salt or sea salt.  Check with your health care provider or pharmacist before using salt substitutes.  Eat lower-sodium products, often labeled as "lower sodium" or "no salt added."  Eat fresh foods.  Eat more vegetables, fruits, and low-fat dairy products.  Choose whole grains. Look for the word "whole" as the first word in the ingredient list.  Choose fish   Limit sweets, desserts, sugars, and sugary drinks.  Choose heart-healthy fats.  Eat veggie cheese   Eat more home-cooked food and less restaurant, buffet, and fast food.  Limit fried foods.  Cook foods using methods other than frying.  Limit canned vegetables. If you do use them, rinse them well to decrease the sodium.  When eating at a restaurant, ask that your food be prepared with less salt, or no salt if possible.                      WHAT FOODS CAN I EAT? Read Dr Joel Fuhrman's books on The End of Dieting & The End of Diabetes  Grains Whole grain or whole wheat bread. Brown rice. Whole grain or whole wheat pasta. Quinoa, bulgur, and whole grain cereals. Low-sodium cereals. Corn or whole wheat flour tortillas. Whole grain cornbread. Whole grain crackers. Low-sodium crackers.  Vegetables Fresh or frozen vegetables (raw, steamed, roasted, or grilled). Low-sodium or reduced-sodium tomato and vegetable juices. Low-sodium or reduced-sodium tomato sauce and paste. Low-sodium or reduced-sodium canned vegetables.   Fruits All fresh, canned (in natural juice), or frozen fruits.  Protein Products  All fish and seafood.  Dried beans, peas, or lentils. Unsalted nuts and seeds. Unsalted canned beans.  Dairy Low-fat dairy products, such as skim or 1% milk, 2% or reduced-fat cheeses, low-fat ricotta or cottage cheese, or plain low-fat yogurt. Low-sodium or reduced-sodium cheeses.  Fats and Oils Tub margarines without trans fats. Light or reduced-fat mayonnaise  and salad dressings (reduced sodium). Avocado. Safflower, olive, or canola oils. Natural peanut or almond butter.  Other Unsalted popcorn and pretzels. The items listed above may not be a complete list of recommended foods or beverages. Contact your dietitian for more options.  +++++++++++++++  WHAT FOODS ARE NOT RECOMMENDED? Grains/ White flour or wheat flour White bread. White pasta. White rice. Refined cornbread. Bagels and croissants. Crackers that contain trans fat.  Vegetables  Creamed or fried vegetables. Vegetables in a . Regular canned vegetables. Regular canned tomato sauce and paste. Regular tomato and vegetable juices.  Fruits Dried fruits. Canned fruit in light or heavy syrup. Fruit juice.  Meat and Other Protein Products Meat in general - RED meat & White meat.  Fatty cuts of meat. Ribs, chicken wings, all processed meats as   bacon, sausage, bologna, salami, fatback, hot dogs, bratwurst and packaged luncheon meats.  Dairy Whole or 2% milk, cream, half-and-half, and cream cheese. Whole-fat or sweetened yogurt. Full-fat cheeses or blue cheese. Non-dairy creamers and whipped toppings. Processed cheese, cheese spreads, or cheese curds.  Condiments Onion and garlic salt, seasoned salt, table salt, and sea salt. Canned and packaged gravies. Worcestershire sauce. Tartar sauce. Barbecue sauce. Teriyaki sauce. Soy sauce, including reduced sodium. Steak sauce. Fish sauce. Oyster sauce. Cocktail sauce. Horseradish. Ketchup and mustard. Meat flavorings and tenderizers. Bouillon cubes. Hot sauce. Tabasco sauce. Marinades. Taco seasonings. Relishes.  Fats and Oils Butter, stick margarine, lard, shortening and bacon fat. Coconut, palm kernel, or palm oils. Regular salad dressings.  Pickles and olives. Salted popcorn and pretzels.  The items listed above may not be a complete list of foods and beverages to avoid.    

## 2019-07-05 NOTE — Progress Notes (Signed)
Virtual Visit via Telephone Note  I connected with Crystal [name] on 07/05/19 at 11:00 AM EST by telephone and verified that I am speaking with the correct person using two identifiers.   I discussed the limitations, risks, security and privacy concerns of performing an evaluation and management service by telephone and the availability of in person appointments. I also discussed with the patient that there may be a patient responsible charge related to this service. The patient expressed understanding and agreed to proceed. 3 Month Follow Up   Assessment and Plan:    Vali was seen today for follow-up.  Diagnoses and all orders for this visit:  Essential hypertension Continue current medications: Monitor blood pressure at home; call if consistently over 130/80 Continue DASH diet.   Reminder to go to the ER if any CP, SOB, nausea, dizziness, severe HA, changes vision/speech, left arm numbness and tingling and jaw pain. -     CBC with Diff; Future -     COMPLETE METABOLIC PANEL WITH GFR; Future -     Magnesium; Future  Mixed hyperlipidemia Continue medications: Discussed dietary and exercise modifications Low fat diet -     Lipid Profile; Future  Hypothyroidism, unspecified type Taking levothyroxine 159mg five days a week and 1.5tabs (1561m two days a week. Reminder to take on an empty stomach 30-6065m before first meal of the day. No antacid medications for 4 hours. -     TSH; Future  Type 2 diabetes mellitus with hyperlipidemia (HCC) Continue medications: Discussed general issues about diabetes pathophysiology and management. Education: Reviewed 'ABCs' of diabetes management (respective goals in parentheses):  A1C (<7), blood pressure (<130/80), and cholesterol (LDL <70) Dietary recommendations Encouraged aerobic exercise.  Discussed foot care, check daily Yearly retinal exam Dental exam every 6 months Monitor blood glucose, discussed goal for  patient -     Hemoglobin A1c ([facility]); Future -     Insulin, random; Future   History of arthroplasty of right shoulder Doing well at this time Follow with [facility]  Costochondral chest pain Improved from last visit Continue to monitor  Gastroesophageal reflux disease, unspecified whether esophagitis present Doing well at this time Continue:  Diet discussed Monitor for triggers Avoid food with high acid content Avoid excessive cafeine Increase water intake  Vitamin D deficiency Continue supplementation Taking Vitamin D 5,000 IU daily -     Vitamin D (25 hydroxy); Future  Crohn's disease of ileum without complication (HCCMallardoing well at this time Monitor triggers contiue to monitor  Obstructive sleep apnea Has Cpap, no using at this time Encouraged use of this  Dry mouth OTC Biotene rinse Increase water intake  Thrush Using Nystatin mouth rinse Follows with Derm at this time, chronic  ASCVD Control blood pressure, lipids and glucose Disscused lifestyle modifications, diet & exercise Continue to monitor  RESTLESS LEG SYNDROME Doing well on current regiment  Other dietary vitamin B12 deficiency anemia -     Iron,Total/Total Iron Binding Cap; Future -     Vitamin B12; Future  Bronchitis, chronic -     benzonatate (TESSALON) 200 MG capsule; Take 1 cap three times daily as needed for cough.  Fibromyalgia -     lidocaine (XYLOCAINE) 2 % jelly; apply small amount topically to affected area 2-3 times a day, knees, legs.  Frequency of micturition -     Urinalysis w microscopic + reflex cultur; Future  S/P total knee replacement, right Impaired mobility and endurance -Referral [facility] Discussed safety around  home     Follow Up Instructions:  Discussed assessment and treatment plan with the patient. The patient was provided an opportunity to ask questions and all were answered. The patient agrees with the plan of care and demonstrates  an understanding of the instructions.   The patient was advised to call back or seek an in-person evaluation if the symptoms worsen or if the condition fails to improve as anticipated.    I provided 30 minutes of non-face-to-face time during this encounter including interview, counseling, chart review, and critical decision making was preformed.   Future Appointments  Date Time Provider [place]  10/25/2019 11:15 AM [name], NP [facility] None  03/20/2020 10:00 AM Unk Pinto, MD [facility] None    ----------------------------------------------------------------------------------------------------------------------  HPI 69 y.o. female  presents for 3 month follow up on HTN, HLD, abnormal glucose, GERD, hypothyroidism, Crohn's, OSA/BiPAPA, Vitamin D & B12 defiency with history of pre-diabetes, weight and vitamin D deficiency.   She is not currently using BiPap. 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  Patient had reverse ball join replacement on 11/15/18 at [facility], Dr Paula Compton. She had a subsequent fall She reports she was out in her yard, tripped and fell forward to land on her chest and R shoulder.  This has since resolved.  She has completed physical therapy for this and next follow up is 08/2019.  She is also s/p right knee replacement and reports that she has had some increasing difficulties with erh abalance She is on gabapentin, cymbalta for widespread ortho pain, did take a few leftover tramadol from surgery, didn't try tylenol, has been advised not to take NSAIDs due to Crohn's history.    She is also being followed for carpal tunnel.   She has had evaluation for right leg cyst, synovial fluid.  Continue to monitor.  Discussed exploratory surgery when patient is ready related to discomfort.  She is S/P bilateral knee replacements 2007, 2009, 200, 2012.  She is having increasing discomfort  in the right and has recently seen Dr Dorothyann Peng for this. Likely poly wear and discussion of monitoring via films. She reports she has been notably weaker in her lower extremities and had a decrease in her balance.  She is concerned as she has been using a cane inside the house for stability.  BMI is Body mass index is 29.46 kg/m., she has not been working on diet and exercise. Wt Readings from Last 3 Encounters:  07/05/19 165 lb (74.8 kg)  03/31/19 171 lb (77.6 kg)  03/02/19 168 lb 3.2 oz (76.3 kg)     HTN predates 2006 Her blood pressure has been controlled at home, today their BP is BP: 136/82  She does not workout. She denies any cardiac symptoms, chest pains, palpitations, shortness of breath, dizziness or lower extremity edema.      She is on cholesterol medication and denies myalgias.   Her cholesterol is not at goal. The cholesterol last visit was:   Lab Results  Component Value Date   CHOL 184 03/02/2019   HDL 48 (L) 03/02/2019   LDLCALC 105 (H) 03/02/2019   TRIG 185 (H) 03/02/2019   CHOLHDL 3.8 03/02/2019    She has not been working on diet and exercise for prediabetes, and denies increased appetite, nausea, paresthesia of the feet, polydipsia and visual disturbances. Last A1C in the office was:  Lab Results  Component Value Date   HGBA1C 5.8 (H)  03/02/2019     Patient does have history of CKD.  Their last GFR was:  Lab Results  Component Value Date   GFRNONAA 63 03/02/2019   Lab Results  Component Value Date   GFRAA 73 03/02/2019     Patient is on Vitamin D supplement with deficiency noted (33 / 2008) Lab Results  Component Value Date   VD25OH >150 (H) 03/02/2019       Current Medications:  Current Outpatient Medications on File Prior to Visit  Medication Sig  . acyclovir (ZOVIRAX) 400 MG tablet TAKE 1 TABLET (400 MG TOTAL) BY MOUTH 3 TIMES DAILY FOR 10 DAYS. TAKE 1 PILL 2 TIMES DAILY WITH FOOD  . aspirin EC 81 MG tablet Take 81 mg by mouth daily.  [name]  BIOTIN PO Take by mouth.  . bisoprolol-hydrochlorothiazide (ZIAC) 5-6.25 MG tablet Take 1 tablet by mouth 2 (two) times daily. Take 1 tablet 2 x /day for BP  . buPROPion (WELLBUTRIN XL) 300 MG 24 hr tablet Take 1 tablet Daily for Mood, Focus & Concentration  . Cyanocobalamin (B-12 SL) Place under the tongue daily.  . diclofenac sodium (VOLTAREN) 1 % GEL Apply 2 g topically 2 (two) times daily as needed.   [name] DIGESTIVE ENZYMES PO Take by mouth 2 (two) times daily.  . DULoxetine (CYMBALTA) 60 MG capsule Take 1 capsule Daily for Chronic Pain  . famotidine (PEPCID) 40 MG tablet Take 40 mg by mouth daily.  . fenofibrate micronized (LOFIBRA) 134 MG capsule TAKE 1 CAPSULE BY MOUTH EVERY DAY BEFORE BREAKFAST  . ferrous sulfate 325 (65 FE) MG tablet Take 325 mg by mouth daily.   . fluticasone (FLONASE) 50 MCG/ACT nasal spray USE TWO SPRAYS IN EACH NOSTRIL DAILY AS NEEDED  . gabapentin (NEURONTIN) 300 MG capsule Take 1 capsule 3 x/day for Chronic Pain  . levothyroxine (SYNTHROID, LEVOTHROID) 100 MCG tablet TAKE 1 TABLET BY MOUTH ON TUES, WED, THURS, SAT AND SUN. TAKE 1& 1/2 TABLET ON MON AND FRI (Patient taking differently: Take one tablet daily)  . meclizine (ANTIVERT) 25 MG tablet TAKE 1 TABLET (25 MG TOTAL) BY MOUTH 3 (THREE) TIMES DAILY AS NEEDED FOR DIZZINESS.  [name] NEOMYCIN-POLYMYXIN-HYDROCORTISONE (CORTISPORIN) 1 % SOLN OTIC solution Place 3 drops into both ears 4 (four) times daily.  [name] nystatin (MYCOSTATIN) 100000 UNIT/ML suspension KEEP 5 ML IN MOUTH AS LONG AS POSSIBLE 4 TIMES A DAY(SWISH & SPIT)USE 48 HRS AFTER SYMPTOMS RESOLVE  . ondansetron (ZOFRAN ODT) 4 MG disintegrating tablet Take 1 tablet (4 mg total) by mouth every 8 (eight) hours as needed for nausea or vomiting.  [name] oxybutynin (DITROPAN) 5 MG tablet Take 1 tablet 2 x /day for Bladder Control  . Phenylephrine-guaiFENesin (MUCUS RELIEF D PO) Take by mouth daily.  . polyethylene glycol (MIRALAX / GLYCOLAX) packet Take 17 g by mouth daily as  needed for mild constipation.  [name] PREBIOTIC PRODUCT PO Take by mouth.  . Probiotic Product (PROBIOTIC DAILY PO) Take by mouth daily.  [name] Respiratory Therapy Supplies (FLUTTER) DEVI Use flutter valve 3 times a day  . rOPINIRole (REQUIP) 2 MG tablet Take 1 tablet 4 x /day for Restless Legs  . tiZANidine (ZANAFLEX) 2 MG tablet Take 1/2 to 1 tablet 3 x /day as needed for Muscle spasm  . TRAMADOL HCL ER PO Take by mouth as needed.   . Vitamin D, Ergocalciferol, (DRISDOL) 1.25 MG (50000 UT) CAPS capsule Take 1 capsule Daily for Severe Vitamin Deficiency (Patient taking differently: 3 (three) times  a week. Take 1 capsule Daily for Severe Vitamin Deficiency)  . Wheat Dextrin (BENEFIBER PO) Take by mouth.  . Adalimumab 40 MG/0.4ML PNKT Inject into the skin.  . fluconazole (DIFLUCAN) 100 MG tablet TAKE 1 TABLET BY MOUTH EVERY DAY  . promethazine-dextromethorphan (PROMETHAZINE-DM) 6.25-15 MG/5ML syrup Use 1 to 2 teaspoonful every 4 hours if needed for severe cough   No current facility-administered medications on file prior to visit.    Allergies:  Allergies  Allergen Reactions  . Atorvastatin Other (See Comments)    Extreme pain  . Codeine Other (See Comments)    Headache  . Pravastatin Other (See Comments)    Exreme pain  . Statins Other (See Comments)    Extreme pain  . Amitriptyline Other (See Comments)    Unspecified  . Fish Oil Nausea Only  . Hydrocodone Other (See Comments)    Itching Other Reaction: Other reaction   . Linseed Oil Other (See Comments)    Other Reaction: Other reaction  . Nuvigil [Armodafinil] Other (See Comments)    Unspecified  . Sulfa Antibiotics Other (See Comments)  . Erythromycin Other (See Comments)    Reaction unspecified  . Erythromycin Base Rash  . Flax Seed [Bio-Flax] Rash    Linseed  . Hydrocodone-Acetaminophen Rash  . Morphine Nausea And Vomiting and Rash  . Nsaids Nausea Only and Other (See Comments)    Other Reaction: Other reaction  .  Sertraline Rash  . Sinemet [Carbidopa W-Levodopa] Rash  . Sulfamethoxazole Rash  . Tolmetin Nausea Only      Medical History:  Past Medical History:  Diagnosis Date  . Arthritis   . ASCVD (arteriosclerotic cardiovascular disease)   . Closed nondisplaced subtrochanteric fracture of left femur ([place]) 09/03/2017   September 2018.  Status post surgery by Dr. Marcelino Scot  . Depression   . Fibromyalgia   . Gait disorder 10/26/2012  . GERD (gastroesophageal reflux disease)   . GI bleed   . Hyperlipidemia   . Hypertension   . Hypothyroid   . Periprosthetic supracondylar fracture of femur, left  03/23/2017  . Restless leg syndrome   . Rhinitis 06/25/2010  . Sleep apnea   . TIA (transient ischemic attack)    3        . Vitamin D deficiency      Family history- Reviewed and unchanged Family History  Problem Relation Age of Onset  . Leukemia Sister   . Lupus Sister   . Depression Sister   . Rheum arthritis Sister   . Heart disease Father   . Hypertension Father   . Hyperlipidemia Father   . Neuropathy Father   . Cancer Mother   . Diabetes Mother   . Osteoporosis Mother   . Breast cancer Mother   . Migraines Daughter      Social history- Reviewed and unchanged Social History   Tobacco Use  . Smoking status: Never Smoker  . Smokeless tobacco: Never Used  Substance Use Topics  . Alcohol use: Yes    Alcohol/week: 0.0 standard drinks    Comment: rarely  . Drug use: No      Patient Care Team: Unk Pinto, MD as PCP - General (Internal Medicine) Star Age, MD as Attending Physician (Neurology) Rozetta Nunnery, MD as Consulting Physician (Otolaryngology) Richmond Campbell, MD as Consulting Physician (Gastroenterology) Erline Levine, MD as Consulting Physician (Neurosurgery) Katy Apo, MD as Consulting Physician (Ophthalmology) Crista Luria, MD as Consulting Physician (Dermatology) Vickey Huger, MD as Consulting Physician (Orthopedic Surgery)  Valeta Harms, MD as Referring Physician (Orthopedic Surgery) Schulenklopper, Rozanna Boer, PT as Physical Therapist (Physical Therapy)    Screening Tests: Immunization History  Administered Date(s) Administered  . DT (Pediatric) 01/03/2015  . Influenza Split 04/16/2018  . Influenza Whole 03/30/2010  . Influenza, High Dose Seasonal PF 03/13/2015  . Influenza-Unspecified 03/31/2011, 05/14/2014, 02/27/2016  . Pneumococcal Conjugate-13 04/17/2015, 01/23/2019  . Pneumococcal Polysaccharide-23 01/06/2017  . Pneumococcal-Unspecified 06/30/2005  . Td 06/30/2004  . Zoster 07/01/2007     Review of Systems:  Review of Systems  Constitutional: Negative for chills, diaphoresis, fever, malaise/fatigue and weight loss.  HENT: Negative for congestion, ear discharge, ear pain, hearing loss, nosebleeds, sinus pain, sore throat and tinnitus.   Eyes: Negative for blurred vision, double vision, photophobia, pain, discharge and redness.  Respiratory: Negative for cough, hemoptysis, sputum production, shortness of breath, wheezing and stridor.   Cardiovascular: Negative for chest pain, palpitations, orthopnea, claudication, leg swelling and PND.  Gastrointestinal: Negative for abdominal pain, blood in stool, constipation, diarrhea, heartburn, melena, nausea and vomiting.  Genitourinary: Negative for dysuria, flank pain, frequency, hematuria and urgency.  Musculoskeletal: Positive for joint pain. Negative for back pain, falls, myalgias and neck pain.       Wrist  Skin: Negative for itching and rash.  Neurological: Positive for weakness. Negative for dizziness, tingling, tremors, sensory change, speech change, focal weakness, seizures, loss of consciousness and headaches.       Lower extremities, bilateral  Endo/Heme/Allergies: Negative for environmental allergies and polydipsia. Does not bruise/bleed easily.  Psychiatric/Behavioral: Negative for depression, hallucinations, memory loss, substance abuse and suicidal  ideas. The patient is not nervous/anxious and does not have insomnia.       Physical Exam: BP 136/82   Pulse 85   Temp 97.8 F (36.6 C)   Wt 165 lb (74.8 kg)   BMI 29.46 kg/m  Wt Readings from Last 3 Encounters:  07/05/19 165 lb (74.8 kg)  03/31/19 171 lb (77.6 kg)  03/02/19 168 lb 3.2 oz (76.3 kg)   General : Well sounding patient in no apparent distress HEENT: no hoarseness, no cough for duration of visit Lungs: speaks in complete sentences, no audible wheezing, no apparent distress Neurological: alert, oriented x 3 Psychiatric: pleasant, judgement appropriate    Garnet Sierras, NP [facility] 2:15 PM

## 2019-07-07 DIAGNOSIS — M961 Postlaminectomy syndrome, not elsewhere classified: Secondary | ICD-10-CM | POA: Diagnosis not present

## 2019-07-07 DIAGNOSIS — M5416 Radiculopathy, lumbar region: Secondary | ICD-10-CM | POA: Diagnosis not present

## 2019-07-11 DIAGNOSIS — K121 Other forms of stomatitis: Secondary | ICD-10-CM | POA: Diagnosis not present

## 2019-07-11 DIAGNOSIS — B379 Candidiasis, unspecified: Secondary | ICD-10-CM | POA: Diagnosis not present

## 2019-07-12 MED ORDER — BENZONATATE 200 MG PO CAPS: ORAL_CAPSULE | ORAL | 1 refills | Status: DC

## 2019-07-12 MED ORDER — LIDOCAINE HCL URETHRAL/MUCOSAL 2 % EX GEL: CUTANEOUS | 0 refills | Status: DC

## 2019-07-15 ENCOUNTER — Other Ambulatory Visit: Payer: Self-pay | Admitting: Internal Medicine

## 2019-07-18 ENCOUNTER — Telehealth: Payer: Self-pay | Admitting: *Deleted

## 2019-07-18 NOTE — Telephone Encounter (Signed)
Per Dr Oneta Rack, the patient no longer needs an antibiotic prior to dental procedures according to [employer], [employer] and the [employer]. Patient is aware.

## 2019-07-21 ENCOUNTER — Other Ambulatory Visit: Payer: Medicare PPO

## 2019-07-21 ENCOUNTER — Other Ambulatory Visit: Payer: Self-pay

## 2019-07-21 DIAGNOSIS — D513 Other dietary vitamin B12 deficiency anemia: Secondary | ICD-10-CM

## 2019-07-21 DIAGNOSIS — I10 Essential (primary) hypertension: Secondary | ICD-10-CM

## 2019-07-21 DIAGNOSIS — R350 Frequency of micturition: Secondary | ICD-10-CM

## 2019-07-21 DIAGNOSIS — E785 Hyperlipidemia, unspecified: Secondary | ICD-10-CM | POA: Diagnosis not present

## 2019-07-21 DIAGNOSIS — E782 Mixed hyperlipidemia: Secondary | ICD-10-CM

## 2019-07-21 DIAGNOSIS — E039 Hypothyroidism, unspecified: Secondary | ICD-10-CM | POA: Diagnosis not present

## 2019-07-21 DIAGNOSIS — E1169 Type 2 diabetes mellitus with other specified complication: Secondary | ICD-10-CM | POA: Diagnosis not present

## 2019-07-21 DIAGNOSIS — E559 Vitamin D deficiency, unspecified: Secondary | ICD-10-CM

## 2019-07-22 LAB — COMPLETE METABOLIC PANEL WITH GFR
AG Ratio: 1.3 (calc) (ref 1.0–2.5)
ALT: 9 U/L (ref 6–29)
AST: 14 U/L (ref 10–35)
Albumin: 4.3 g/dL (ref 3.6–5.1)
Alkaline phosphatase (APISO): 42 U/L (ref 37–153)
BUN/Creatinine Ratio: 24 (calc) — ABNORMAL HIGH (ref 6–22)
BUN: 24 mg/dL (ref 7–25)
CO2: 37 mmol/L — ABNORMAL HIGH (ref 20–32)
Calcium: 10.3 mg/dL (ref 8.6–10.4)
Chloride: 95 mmol/L — ABNORMAL LOW (ref 98–110)
Creat: 0.98 mg/dL — ABNORMAL HIGH (ref 0.60–0.93)
GFR, Est African American: 68 mL/min/{1.73_m2} (ref >=60)
GFR, Est Non African American: 58 mL/min/{1.73_m2} — ABNORMAL LOW (ref >=60)
Globulin: 3.2 g/dL (calc) (ref 1.9–3.7)
Glucose, Bld: 201 mg/dL — ABNORMAL HIGH (ref 65–99)
Potassium: 3.7 mmol/L (ref 3.5–5.3)
Sodium: 140 mmol/L (ref 135–146)
Total Bilirubin: 0.4 mg/dL (ref 0.2–1.2)
Total Protein: 7.5 g/dL (ref 6.1–8.1)

## 2019-07-22 LAB — CBC WITH DIFFERENTIAL/PLATELET
Absolute Monocytes: 714 cells/uL (ref 200–950)
Basophils Absolute: 122 cells/uL (ref 0–200)
Basophils Relative: 1.3 %
Eosinophils Absolute: 66 cells/uL (ref 15–500)
Eosinophils Relative: 0.7 %
HCT: 46.9 % — ABNORMAL HIGH (ref 35.0–45.0)
Hemoglobin: 15.8 g/dL — ABNORMAL HIGH (ref 11.7–15.5)
Lymphs Abs: 1852 cells/uL (ref 850–3900)
MCH: 30.5 pg (ref 27.0–33.0)
MCHC: 33.7 g/dL (ref 32.0–36.0)
MCV: 90.5 fL (ref 80.0–100.0)
MPV: 9.4 fL (ref 7.5–12.5)
Monocytes Relative: 7.6 %
Neutro Abs: 6646 cells/uL (ref 1500–7800)
Neutrophils Relative %: 70.7 %
Platelets: 393 10*3/uL (ref 140–400)
RBC: 5.18 10*6/uL — ABNORMAL HIGH (ref 3.80–5.10)
RDW: 13 % (ref 11.0–15.0)
Total Lymphocyte: 19.7 %
WBC: 9.4 10*3/uL (ref 3.8–10.8)

## 2019-07-22 LAB — LIPID PANEL
Cholesterol: 162 mg/dL (ref <200)
HDL: 45 mg/dL — ABNORMAL LOW (ref >=50)
LDL Cholesterol (Calc): 87 mg/dL (calc)
Non-HDL Cholesterol (Calc): 117 mg/dL (calc) (ref <130)
Total CHOL/HDL Ratio: 3.6 (calc) (ref <5.0)
Triglycerides: 207 mg/dL — ABNORMAL HIGH (ref <150)

## 2019-07-22 LAB — HEMOGLOBIN A1C
Hgb A1c MFr Bld: 6.3 % of total Hgb — ABNORMAL HIGH (ref <5.7)
Mean Plasma Glucose: 134 (calc)
eAG (mmol/L): 7.4 (calc)

## 2019-07-22 LAB — URINE CULTURE
MICRO NUMBER:: 10067138
Result:: NO GROWTH
SPECIMEN QUALITY:: ADEQUATE

## 2019-07-22 LAB — VITAMIN D 25 HYDROXY (VIT D DEFICIENCY, FRACTURES): Vit D, 25-Hydroxy: 100 ng/mL (ref 30–100)

## 2019-07-22 LAB — VITAMIN B12: Vitamin B-12: 1449 pg/mL — ABNORMAL HIGH (ref 200–1100)

## 2019-07-22 LAB — IRON, TOTAL/TOTAL IRON BINDING CAP
%SAT: 31 % (calc) (ref 16–45)
Iron: 96 ug/dL (ref 45–160)
TIBC: 312 mcg/dL (calc) (ref 250–450)

## 2019-07-22 LAB — TSH: TSH: 2.27 mIU/L (ref 0.40–4.50)

## 2019-07-22 LAB — MAGNESIUM: Magnesium: 2.3 mg/dL (ref 1.5–2.5)

## 2019-07-22 LAB — INSULIN, RANDOM: Insulin: 46.1 u[IU]/mL — ABNORMAL HIGH

## 2019-07-28 DIAGNOSIS — Z4789 Encounter for other orthopedic aftercare: Secondary | ICD-10-CM | POA: Diagnosis not present

## 2019-07-28 DIAGNOSIS — M180 Bilateral primary osteoarthritis of first carpometacarpal joints: Secondary | ICD-10-CM | POA: Diagnosis not present

## 2019-08-01 DIAGNOSIS — R21 Rash and other nonspecific skin eruption: Secondary | ICD-10-CM | POA: Diagnosis not present

## 2019-08-01 DIAGNOSIS — L438 Other lichen planus: Secondary | ICD-10-CM | POA: Diagnosis not present

## 2019-08-03 DIAGNOSIS — M48062 Spinal stenosis, lumbar region with neurogenic claudication: Secondary | ICD-10-CM | POA: Diagnosis not present

## 2019-08-03 DIAGNOSIS — M79604 Pain in right leg: Secondary | ICD-10-CM | POA: Diagnosis not present

## 2019-08-03 DIAGNOSIS — M79605 Pain in left leg: Secondary | ICD-10-CM | POA: Diagnosis not present

## 2019-08-03 DIAGNOSIS — R531 Weakness: Secondary | ICD-10-CM | POA: Diagnosis not present

## 2019-08-04 DIAGNOSIS — M542 Cervicalgia: Secondary | ICD-10-CM | POA: Diagnosis not present

## 2019-08-04 DIAGNOSIS — M25511 Pain in right shoulder: Secondary | ICD-10-CM | POA: Diagnosis not present

## 2019-08-04 DIAGNOSIS — M961 Postlaminectomy syndrome, not elsewhere classified: Secondary | ICD-10-CM | POA: Diagnosis not present

## 2019-08-04 DIAGNOSIS — M791 Myalgia, unspecified site: Secondary | ICD-10-CM | POA: Diagnosis not present

## 2019-08-08 ENCOUNTER — Ambulatory Visit: Payer: Medicare Other | Admitting: Neurology

## 2019-08-08 DIAGNOSIS — H524 Presbyopia: Secondary | ICD-10-CM | POA: Diagnosis not present

## 2019-08-08 DIAGNOSIS — H40013 Open angle with borderline findings, low risk, bilateral: Secondary | ICD-10-CM | POA: Diagnosis not present

## 2019-08-08 DIAGNOSIS — R531 Weakness: Secondary | ICD-10-CM | POA: Diagnosis not present

## 2019-08-08 DIAGNOSIS — M79605 Pain in left leg: Secondary | ICD-10-CM | POA: Diagnosis not present

## 2019-08-08 DIAGNOSIS — G510 Bell's palsy: Secondary | ICD-10-CM | POA: Diagnosis not present

## 2019-08-08 DIAGNOSIS — Z961 Presence of intraocular lens: Secondary | ICD-10-CM | POA: Diagnosis not present

## 2019-08-08 DIAGNOSIS — M79604 Pain in right leg: Secondary | ICD-10-CM | POA: Diagnosis not present

## 2019-08-08 DIAGNOSIS — M48062 Spinal stenosis, lumbar region with neurogenic claudication: Secondary | ICD-10-CM | POA: Diagnosis not present

## 2019-08-09 ENCOUNTER — Encounter: Payer: Self-pay | Admitting: Neurology

## 2019-08-09 ENCOUNTER — Ambulatory Visit: Payer: Medicare PPO | Admitting: Neurology

## 2019-08-09 VITALS — BP 130/88 | HR 91 | Temp 96.8°F | Ht 64.0 in | Wt 160.3 lb

## 2019-08-09 DIAGNOSIS — M79604 Pain in right leg: Secondary | ICD-10-CM | POA: Diagnosis not present

## 2019-08-09 DIAGNOSIS — R296 Repeated falls: Secondary | ICD-10-CM | POA: Diagnosis not present

## 2019-08-09 DIAGNOSIS — Z9181 History of falling: Secondary | ICD-10-CM | POA: Diagnosis not present

## 2019-08-09 DIAGNOSIS — M79605 Pain in left leg: Secondary | ICD-10-CM

## 2019-08-09 DIAGNOSIS — R269 Unspecified abnormalities of gait and mobility: Secondary | ICD-10-CM | POA: Diagnosis not present

## 2019-08-09 NOTE — Progress Notes (Signed)
Subjective:    Patient ID: Crystal [name] is a 69 y.o. female.  HPI     Interim history:   Crystal [name] is a 69 year old left-handed woman with a complex underlying medical history of hypertension, hyperlipidemia, Crohn's disease, hypothyroidism, obstructive sleep apnea, right-sided TIA in March 2005 (left-sided weakness and numbness in the face lasting 40 minutes), depression, and a history of right Bell's palsy, chronic back pain, fibromyalgia, RLS, interstitial cystitis and cervical and lumbar degenerative disc disease (s/p spine surgery of the lumbar spine under Dr. Vertell Limber x 2 and neck surgery x 1), who presents for follow-up consultation of her gait disorder and history of falls. She is unaccompanied today and presents after a longer gap of nearly 2 years.  I last saw her on 09/29/2017, at which time she reported right knee pain.  She had fractured her femur and had surgery in September 2018.  She reported pain in both legs.  I suggested we proceed with an EMG nerve conduction velocity test.  She did not have it done.  Today, 08/09/2019: She reports ongoing issues with leg pain and low back pain.  She has lumbar spinal stenosis, sees Dr. Maryjean Ka. She has not seen Dr. Vertell Limber in the recent past, reports recurrent falls and perhaps also hitting her head.  She fell yesterday after she finished her I doctor appointment and fell outside.  She had help from for people that were around, EMS was not called, she believes that she may have hit her head but does not remember, feels a little bit of soreness in the left posterior head.  No obvious wound.  She has ongoing issues with right knee pain, saw somebody at [facility] for this.  She had right shoulder surgery, left hand surgery.  She did not bring a walker or cane today.  She has chronic issues with dry mouth, admits that she does not always drink a whole lot of water, maybe up to 4 cups/day, 1 ginger ale and also some Gatorade.  She is a non-smoker and  does not drink alcohol.   She had a head CT without contrast on 09/09/2017, which showed: IMPRESSION: Negative exam. No cause of the presenting symptoms is identified. Mild age related volume loss. Small heavily calcified meningioma of the falx not felt to be significant. See above discussion.   Previously:   She no showed for an appointment on 08/06/2015.    08/28/2015: She reports difficulty with her sleep including sleep maintenance, she has some snoring. Her Epworth sleepiness score is 13 out of 24, her fatigue score is 57 out of 63. Of note, in the interim, she had ACDF under Dr. Vertell Limber on 06/14/2015. She went home, but was in pain had AMS. She had medication changes for pain. She then presented to the emergency room on 06/24/2015 for pain. She was admitted and was in the hospital from 06/24/15 and 06/26/15. I reviewed the ER records and hospital records including discharge summary. CT cervical spine showed stable findings. She was felt to be overmedicated and some medications were adjusted and changed. She started seeing Dr. Maryjean Ka. She takes no narcotic pain medication. She takes gabapentin 300 mg tid, and Cymbalta and Linzess. She is taking ropinirole 2 mg bid, 5 PM and BT, which is between 10 to 11 PM. She fell onto her right arm recently and had a bruise on her right arm. She is able to walk without a cane at this time. She overall is pleased with how her neck  surgery has helped her. She has problems with both shoulders and may need shoulder surgeries in the near future. She has cataracts and has upcoming left-sided cataract surgery later in March 2017.   I saw her on 12/06/2014, at which time we talked about her BiPAP compliance. She was suboptimally compliant and was not able to tolerate it fully. She was advised to talk to her dentist about a dental appliance for potential treatment of her sleep apnea. She had previously been diagnosed with obstructive sleep apnea and placed on BiPAP  therapy. She was diagnosed and placed on treatment before I first met her. She was also advised to talk to her ear nose throat physician about surgical options for sleep apnea treatment. I had previously seen her for her gait disorder. I saw her on 08/07/2014, at which time she admitted that she was not fully compliant with her BiPAP. She had tolerance issues with the mask. I prescribed a new nose mask. She was also complaining of shoulder pain. I suggested that she continue using her BiPAP regularly and provided her with an order for new supplies.  I reviewed her BiPAP compliance data from 07/08/2014 through 08/06/2014 which is a total of 30 days during which time she used her machine only 16 days with percent used days greater than 4 hours at only 47%, indicating suboptimal compliance with an average usage for all days of only 2 hours and 44 minutes, residual AHI low at 0.8, leak acceptable. Pressure setting of maximum IPAP of 19 cm, minimum EPAP of 5 cm and pressure support of 4 cm.    I saw her on 09/13/2013, at which time she reported no further TIA type symptoms. She had a stable gait. I suggested a prn follow-up. She requested to establish care for her obstructive sleep apnea as she no longer has a sleep specialist.    I reviewed today her compliance data from 05/09/2014 through 08/06/2014 which is a total of 90 days during which time she used her machine only 39 days with percent used days greater than 4 hours of 37%, indicating poor compliance. She is on AutoBiPAP with a maximum IPAP of 19, minimum EPAP of 5, pressure support of 4. Residual AHI is low at 0.9 per hour, leak acceptable with the 95th percentile at 13.3 L/m. Average usage for all days of only 2 hours and 16 minutes.    I saw her on 04/28/2013, at which time I felt her physical exam was stable and she was advised to get started on baby aspirin for possible recent TIA with transient right eye vision loss. I requested echocardiogram,  carotid Doppler study and repeat brain MRI and referred her to ENT. She was encouraged to discuss her stress and depression symptoms with her primary care physician. She was also encouraged to restart psychotherapy. We talked about secondary stroke prevention and her multifactorial gait disorder. Her carotid Doppler study from 05/24/2013 was unremarkable. Her brain MRI without contrast from 05/11/2013 was reported as normal without change from January 2014. In addition I personally reviewed the images through the PACS system. Her echocardiogram from 05/16/2013 showed an EF of 50-55%, mild mitral regurgitation, trivial tricuspid regurgitation, mild left atrial dilatation, and otherwise normal findings. We called her with all her test results.    I first met her on 10/26/2012, and which time I felt she had tremors as well as a multifactorial gait disorder and I suggested no new medications at the time but reinforced the need for  treatment of her sleep apnea and we talked about general cardiovascular risk reduction. In 9/14, she had sudden onset of R eye vision loss for a about 40 minutes. She saw an eye doctor, and was told she may have glaucoma, and she has astigmatism. She has had orthostatic hypotension before. She endorsed stress and lost her son in 08/07/10, her mother died in Aug 08, 2011, and her father died on Apr 15, 2013. She was tearful. She had counseling in the past. She had PT per ortho and per PCP. She has had vertigo.   She has previously been seen by Dr. Erling Cruz and was last seen by him on 07/05/2012 for her history of balance difficulties. At the time of her last visit he suggested a repeat MRI and MRA of the brain. MRI and MRA of the brain did not show any acute or new abnormalities and changes were not much progressed from 08-Aug-2003.   She has had bilateral hand tremors and a family history of tremors and degenerative disc disease as well as obstructive sleep apnea on BiPAP and was on CPAP in the past, and is status  post left knee surgery in 08-07-08.   She fell in March 3/14 and went to [facility] and was Dx with food poisoning, and she had L leg pain and had X ray of the L leg. She has a history of dream enactments and was told that she has PLMs. She uses a nasal mask, but her residual R facial weakness makes it hard for her to use the mask and she is not closing her eye. She has been on ASA 81 mg since her last presumed TIA in 12/13. She has ongoing problems with her L shoulder and had 2 surgeries on it.  Her Past Medical History Is Significant For: Past Medical History:  Diagnosis Date  . Arthritis   . ASCVD (arteriosclerotic cardiovascular disease)   . Closed nondisplaced subtrochanteric fracture of left femur ([place]) 09/03/2017   September 2018.  Status post surgery by Dr. Marcelino Scot  . Depression   . Fibromyalgia   . Gait disorder 10/26/2012  . GERD (gastroesophageal reflux disease)   . GI bleed   . Hyperlipidemia   . Hypertension   . Hypothyroid   . Periprosthetic supracondylar fracture of femur, left  03/23/2017  . Restless leg syndrome   . Rhinitis 06/25/2010  . Sleep apnea   . TIA (transient ischemic attack)    3        . Vitamin D deficiency     Her Past Surgical History Is Significant For: Past Surgical History:  Procedure Laterality Date  . ABDOMINAL HYSTERECTOMY  1992  . ANTERIOR CERVICAL DECOMPRESSION/DISCECTOMY FUSION 4 LEVELS N/A 06/14/2015   Procedure: Cervical three-four Cervical four-five Cervical five- six Cervical six- seven  Anterior cervical decompression/diskectomy/fusion;  Surgeon: Erline Levine, MD;  Location: [facility];  Service: Neurosurgery;  Laterality: N/A;  C3-4 C4-5 C5-6 C6-7 Anterior cervical decompression/diskectomy/fusion  . APPENDECTOMY  1960  . [place]  . JOINT REPLACEMENT     2   LEFT  1 RIGHT   . KNEE SURGERY    . ORIF FEMUR FRACTURE Left 03/24/2017   Procedure: OPEN REDUCTION INTERNAL FIXATION (ORIF) FEMUR FRACTURE;  Surgeon: Altamese Cheyenne Wells, MD;   Location: [facility];  Service: Orthopedics;  Laterality: Left;  . ROTATOR CUFF REPAIR Left 08/08/03   2 LEFT  1  RIGHT  . SHOULDER ADHESION RELEASE    . SPINE SURGERY    .  TOE SURGERY     LEFT       . TOE SURGERY     RIGHT  BIG TOE  . TONSILLECTOMY  1960    Her Family History Is Significant For: Family History  Problem Relation Age of Onset  . Leukemia Sister   . Lupus Sister   . Depression Sister   . Rheum arthritis Sister   . Heart disease Father   . Hypertension Father   . Hyperlipidemia Father   . Neuropathy Father   . Cancer Mother   . Diabetes Mother   . Osteoporosis Mother   . Breast cancer Mother   . Migraines Daughter     Her Social History Is Significant For: Social History   Socioeconomic History  . Marital status: Married    Spouse name: Not on file  . Number of children: 2  . Years of education: Not on file  . Highest education level: Not on file  Occupational History  . Occupation: Retired Games developer: DISABLED  Tobacco Use  . Smoking status: Never Smoker  . Smokeless tobacco: Never Used  Substance and Sexual Activity  . Alcohol use: Yes    Alcohol/week: 0.0 standard drinks    Comment: rarely  . Drug use: No  . Sexual activity: Not on file  Other Topics Concern  . Not on file  Social History Narrative   Drinks about 1 cup of coffee a day, occasional coke zero    Social Determinants of Health   Financial Resource Strain:   . Difficulty of Paying Living Expenses: Not on file  Food Insecurity:   . Worried About Charity fundraiser in the Last Year: Not on file  . Ran Out of Food in the Last Year: Not on file  Transportation Needs:   . Lack of Transportation (Medical): Not on file  . Lack of Transportation (Non-Medical): Not on file  Physical Activity:   . Days of Exercise per Week: Not on file  . Minutes of Exercise per Session: Not on file  Stress:   . Feeling of Stress : Not on file  Social Connections:   . Frequency of  Communication with Friends and Family: Not on file  . Frequency of Social Gatherings with Friends and Family: Not on file  . Attends Religious Services: Not on file  . Active Member of Clubs or Organizations: Not on file  . Attends Archivist Meetings: Not on file  . Marital Status: Not on file    Her Allergies Are:  Allergies  Allergen Reactions  . Atorvastatin Other (See Comments)    Extreme pain  . Codeine Other (See Comments)    Headache  . Pravastatin Other (See Comments)    Exreme pain  . Statins Other (See Comments)    Extreme pain  . Amitriptyline Other (See Comments)    Unspecified  . Fish Oil Nausea Only  . Hydrocodone Other (See Comments)    Itching Other Reaction: Other reaction   . Linseed Oil Other (See Comments)    Other Reaction: Other reaction  . Nuvigil [Armodafinil] Other (See Comments)    Unspecified  . Sulfa Antibiotics Other (See Comments)  . Erythromycin Other (See Comments)    Reaction unspecified  . Erythromycin Base Rash  . Flax Seed [Bio-Flax] Rash    Linseed  . Hydrocodone-Acetaminophen Rash  . Morphine Nausea And Vomiting and Rash  . Nsaids Nausea Only and Other (See Comments)    Other  Reaction: Other reaction  . Sertraline Rash  . Sinemet [Carbidopa W-Levodopa] Rash  . Sulfamethoxazole Rash  . Tolmetin Nausea Only  :   Her Current Medications Are:  Outpatient Encounter Medications as of 08/09/2019  Medication Sig  . acyclovir (ZOVIRAX) 400 MG tablet TAKE 1 TABLET (400 MG TOTAL) BY MOUTH 3 TIMES DAILY FOR 10 DAYS. TAKE 1 PILL 2 TIMES DAILY WITH FOOD  . Adalimumab 40 MG/0.4ML PNKT Inject into the skin.  [name] aspirin EC 81 MG tablet Take 81 mg by mouth daily.  . benzonatate (TESSALON) 200 MG capsule Take 1 cap three times daily as needed for cough.  . bisoprolol-hydrochlorothiazide (ZIAC) 5-6.25 MG tablet Take 1 tablet by mouth 2 (two) times daily. Take 1 tablet 2 x /day for BP  . buPROPion (WELLBUTRIN XL) 300 MG 24 hr tablet Take  1 tablet Daily for Mood, Focus & Concentration  . Calcium Citrate-Vitamin D (CALCIUM CITRATE + PO) Take by mouth.  . Cyanocobalamin (B-12 SL) Place under the tongue daily.  . diclofenac sodium (VOLTAREN) 1 % GEL Apply 2 g topically 2 (two) times daily as needed.   [name] DIGESTIVE ENZYMES PO Take by mouth 2 (two) times daily.  . DULoxetine (CYMBALTA) 60 MG capsule Take 1 capsule Daily for Chronic Pain  . famotidine (PEPCID) 40 MG tablet Take 40 mg by mouth daily.  . fenofibrate micronized (LOFIBRA) 134 MG capsule TAKE 1 CAPSULE BY MOUTH EVERY DAY BEFORE BREAKFAST  . ferrous sulfate 325 (65 FE) MG tablet Take 325 mg by mouth daily.   . fluconazole (DIFLUCAN) 100 MG tablet TAKE 1 TABLET BY MOUTH EVERY DAY (Patient taking differently: 1 per week)  . fluticasone (FLONASE) 50 MCG/ACT nasal spray USE TWO SPRAYS IN EACH NOSTRIL DAILY AS NEEDED  . gabapentin (NEURONTIN) 300 MG capsule Take 1 capsule 3 x/day for Chronic Pain  . levothyroxine (SYNTHROID) 100 MCG tablet TAKE 1 TABLET BY MOUTH ON TUES, WED, THURS, SAT AND SUN. TAKE 1& 1/2 TABLET ON MON AND FRI  . lidocaine (XYLOCAINE) 2 % jelly apply small amount topically to affected area 2-3 times a day, knees, legs.  . meclizine (ANTIVERT) 25 MG tablet TAKE 1 TABLET (25 MG TOTAL) BY MOUTH 3 (THREE) TIMES DAILY AS NEEDED FOR DIZZINESS.  [name] metroNIDAZOLE (FLAGYL) 500 MG tablet Take 500 mg by mouth 2 (two) times daily.  . NEOMYCIN-POLYMYXIN-HYDROCORTISONE (CORTISPORIN) 1 % SOLN OTIC solution Place 3 drops into both ears 4 (four) times daily.  . ondansetron (ZOFRAN ODT) 4 MG disintegrating tablet Take 1 tablet (4 mg total) by mouth every 8 (eight) hours as needed for nausea or vomiting.  [name] oxybutynin (DITROPAN) 5 MG tablet TAKE 1 TABLET 2 X /DAY FOR BLADDER CONTROL  . Phenylephrine-guaiFENesin (MUCUS RELIEF D PO) Take by mouth daily.  . polyethylene glycol (MIRALAX / GLYCOLAX) packet Take 17 g by mouth daily as needed for mild constipation.  [name] PREBIOTIC PRODUCT PO  Take by mouth.  . Probiotic Product (PROBIOTIC DAILY PO) Take by mouth daily.  [name] Respiratory Therapy Supplies (FLUTTER) DEVI Use flutter valve 3 times a day  . rOPINIRole (REQUIP) 2 MG tablet Take 1 tablet 4 x /day for Restless Legs  . tiZANidine (ZANAFLEX) 2 MG tablet Take 1/2 to 1 tablet 3 x /day as needed for Muscle spasm  . TRAMADOL HCL ER PO Take by mouth as needed.   . [DISCONTINUED] BIOTIN PO Take by mouth.  . [DISCONTINUED] nystatin (MYCOSTATIN) 100000 UNIT/ML suspension KEEP 5 ML IN MOUTH AS  LONG AS POSSIBLE 4 TIMES A DAY(SWISH & SPIT)USE 48 HRS AFTER SYMPTOMS RESOLVE  . [DISCONTINUED] promethazine-dextromethorphan (PROMETHAZINE-DM) 6.25-15 MG/5ML syrup Use 1 to 2 teaspoonful every 4 hours if needed for severe cough  . [DISCONTINUED] Vitamin D, Ergocalciferol, (DRISDOL) 1.25 MG (50000 UT) CAPS capsule Take 1 capsule Daily for Severe Vitamin Deficiency (Patient taking differently: 3 (three) times a week. Take 1 capsule Daily for Severe Vitamin Deficiency)  . [DISCONTINUED] Wheat Dextrin (BENEFIBER PO) Take by mouth.   No facility-administered encounter medications on file as of 08/09/2019.  :  Review of Systems:  Out of a complete 14 point review of systems, all are reviewed and negative with the exception of these symptoms as listed below: Review of Systems  Neurological:       Pt reports and increase in falls and decrease sense of balance and would like to discuss. Most recent fall was yesterday.    Objective:  Neurological Exam  Physical Exam Physical Examination:   Vitals:   08/09/19 1122  BP: 130/88  Pulse: 91  Temp: (!) 96.8 F (36 C)   General Examination: The patient is a very pleasant 69 y.o. female in no acute distress. She appears well-developed and well-nourished and well groomed.   HEENT:Normocephalic, atraumatic, pupils are equal, round and reactive to light and accommodation. Extraocular tracking is fairly good. Normal smooth pursuit is noted. Hearing is  grossly intact. Face is asymmetric with chronic R facial weakness and mild R sided hemifacial spasm, appears stable. Speech is clear with no dysarthria noted. There is no hypophonia. There is no lip, neck or jaw tremor. Oropharynx exam reveals: severe mouth dryness. Adequate dental hygiene. Tonsils are absent. Mallampati is class II. Tongue protrudes centrally and palate elevates symmetrically. Scar from neck surgery, unrem.   Chest:Clear to auscultation without wheezing, rhonchi or crackles noted.  Heart:S1+S2+0, regular and normal without murmurs, rubs or gallops noted.   Abdomen:Soft, non-tender and non-distended with normal bowel sounds appreciated on auscultation.  Extremities:There is no pitting edema in the distal lower extremities bilaterally.   Skin: Warm and dry without trophic changes noted. There are no varicose veins.   Musculoskeletal: exam reveals R knee swelling, changes c/w arthritis in her hands are noted.   Neurologically:  Mental status: The patient is awake, alert and oriented in all 4 spheres. Her memory, attention, language and knowledge are appropriate. There is no aphasia, agnosia, apraxia or anomia. Speech is clear with normal prosody and enunciation. Thought process is linear. Mood is congruent and affect is normal.  Cranial nerves are as described above under HEENT exam. In addition, shoulder shrug is normal with equal shoulder height noted. Motor exam: Normal bulk, strength and tone is noted. There is no drift, or rebound. Reflexes are 1+ in the UE, trace in the LEs. Fine motor skills are fairly well preserved.   Cerebellar testing shows no dysmetria or intention tremor on finger to nose testing.   Sensory exam is intact to light touch, temp and vibration in the UEs and LEs.  Gait, station and balance: cautious gait. No leaning to one side is noted. Posture is age-appropriate and stance is narrow based. No problems turning are noted. No walker or cane.    Assessment and Plan:   In summary, Crystal [name] is a 69 year old female with a complex underlying medical history of hypertension, hyperlipidemia, Crohn's disease, hypothyroidism, obstructive sleep apnea, hx of TIA, depression, right Bell's palsy and hemifacial spasms, chronic back pain, fibromyalgia, RLS, interstitial cystitis  and cervical and lumbar degenerative disc disease, Lumbar spinal stenosis, arthritis with status post multiple surgeries including surgery for femur fracture last year, status post lower back and cervical spine surgeries, bilateral knee replacement surgeries, and on multiple medications, who presents for re-evaluation of her gait disorder and her recurrent falls. She Continues to be on multiple medications including potentially sedating medications including Cymbalta, gabapentin, meclizine, tramadol, Zanaflex, ropinirole. I had ordered an EMG and nerve conduction velocity test in 2019 which she did not pursue at the time. She would be willing to pursue this, we will look for signs of neuropathy versus radiculopathy in the lower extremities. She has a gait disorder, likely secondary to multiple factors including suboptimal hydration, having fallen before, degenerative spine disease, degenerative arthritis with status post multiple surgeries including joint replacement surgeries, residual pain from her back and neck, pain in her legs, and from taking multiple medications including potentially sedating medications. She is Again advised to use a walker at all times.  Unfortunately, I am not sure what else to offer this patient, we have talked about her gait disorder on several occasions, at this juncture, I recommended that she talk to her primary care physician about a referral to another neurologist for further input, perhaps academic neurology.She recently fell and potentially hit her head, she is not sure.  She does not have any headache but does report some soreness in the left  posterior head.  She is advised to proceed with a head CT without contrast and we will call her with her test results.  If she has a significant fall with injury, she is Strongly advised to get checked out immediately in the ER, or call 911. I answered all her questions today and she was in agreement. I spent 30 minutes in total face-to-face and in reviewing records during pre-charting, more than 50% of which was spent in counseling and coordination of care, reviewing test results, reviewing medication and discussing or reviewing the diagnosis of gait disorder, rec. falls, the prognosis and treatment options. Pertinent laboratory and imaging test results that were available during this visit with the patient were reviewed by me and considered in my medical decision making (see chart for details).

## 2019-08-09 NOTE — Patient Instructions (Addendum)
Please start using a walker at all times. We have previously discussed your falls and your fall risk.  I am afraid, at this point, I really do not have a whole lot to offer you.  As discussed today, I will do a head CT because you have fallen in the past and hit your head.  If you fall and you hit your head you have to call 911 or have someone take you to the emergency room immediately.  Please try to stay better hydrated with water as dehydration or suboptimal hydration may also increase your fall risk.  Please follow-up with your spine specialist if needed, please continue following with Dr. Maryjean Ka as scheduled.  For further evaluation of your balance and gait disorder, you may benefit from seeing a academic neurologist or another neurologist as I am sure what else to do for you.  Please talk to your primary care physician about a referral.

## 2019-08-17 ENCOUNTER — Telehealth: Payer: Self-pay | Admitting: Neurology

## 2019-08-17 NOTE — Telephone Encounter (Signed)
Mcarthur Rossetti Josem Kaufmann: [id number] (exp. 08/17/19 to 09/16/19) order sent to GI. They will reach out to the patient to schedule.

## 2019-09-15 ENCOUNTER — Ambulatory Visit (INDEPENDENT_AMBULATORY_CARE_PROVIDER_SITE_OTHER): Payer: Medicare PPO | Admitting: Diagnostic Neuroimaging

## 2019-09-15 ENCOUNTER — Other Ambulatory Visit: Payer: Self-pay

## 2019-09-15 ENCOUNTER — Encounter: Payer: Medicare PPO | Admitting: Diagnostic Neuroimaging

## 2019-09-15 DIAGNOSIS — Z0289 Encounter for other administrative examinations: Secondary | ICD-10-CM

## 2019-09-15 DIAGNOSIS — R296 Repeated falls: Secondary | ICD-10-CM

## 2019-09-15 DIAGNOSIS — R200 Anesthesia of skin: Secondary | ICD-10-CM | POA: Diagnosis not present

## 2019-09-15 DIAGNOSIS — M79605 Pain in left leg: Secondary | ICD-10-CM

## 2019-09-15 DIAGNOSIS — R269 Unspecified abnormalities of gait and mobility: Secondary | ICD-10-CM

## 2019-09-15 DIAGNOSIS — M79604 Pain in right leg: Secondary | ICD-10-CM

## 2019-09-15 DIAGNOSIS — Z9181 History of falling: Secondary | ICD-10-CM

## 2019-09-19 NOTE — Procedures (Signed)
[facility]  NCS (NERVE CONDUCTION STUDY) WITH EMG (ELECTROMYOGRAPHY) REPORT   STUDY DATE: 09/15/19 PATIENT NAME: Crystal [name] DOB: 02-May-1951 MRN: [id number]  ORDERING CLINICIAN: Star Age, MD PhD   TECHNOLOGIST: Sherre Scarlet ELECTROMYOGRAPHER: Earlean Polka. Cabot Cromartie, MD  CLINICAL INFORMATION: 69 year old female with numbness in bilateral feet.  FINDINGS: NERVE CONDUCTION STUDY:  Bilateral peroneal and left tibial motor responses are normal.  Right tibial motor response has normal distal latency, decreased amplitude and normal conduction velocity.  Left sural and left superficial peroneal sensory responses are normal.  Right sural sensory response has decreased amplitude.  Right superficial peroneal sensory response could not be obtained.  Bilateral tibial F wave latencies are slightly prolonged.   NEEDLE ELECTROMYOGRAPHY:  Needle examination of right lower extremity notable for decreased recruitment of large motor unit recruitment in right tibialis anterior.  Right vastus medialis and right gastrocnemius muscles normal.   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 Left lower extremity is normal.    INTERPRETING PHYSICIAN:  Penni Bombard, MD Certified in Neurology, Neurophysiology and Neuroimaging  [facility] [address] [place], [place] [address] [phone]   [facility]    Nerve / Sites Muscle Latency Ref. Amplitude Ref. Rel Amp Segments Distance Velocity Ref. Area    ms ms mV mV %  cm m/s m/s mVms  L Peroneal - EDB     Ankle EDB 5.7 ?6.5 4.3 ?2.0 100 Ankle - EDB 9   13.5     Fib head EDB 11.6  3.7  86.1 Fib head - Ankle 26 44 ?44 13.5     Pop fossa EDB 13.9  3.3  90.6 Pop fossa - Fib head 10 44 ?44 12.3         Pop fossa - Ankle      R Peroneal - EDB     Ankle EDB 6.2 ?6.5 2.6 ?2.0 100 Ankle - EDB 9   12.9     Fib  head EDB 12.4  2.4  92.7 Fib head - Ankle 27 44 ?44 13.8     Pop fossa EDB 14.6  2.3  96.1 Pop fossa - Fib head 10 44 ?44 11.9         Pop fossa - Ankle      L Tibial - AH     Ankle AH 5.3 ?5.8 4.0 ?4.0 100 Ankle - AH 9   13.4     Pop fossa AH 13.8  3.4  84.8 Pop fossa - Ankle 35 41 ?41 19.3  R Tibial - AH     Ankle AH 5.0 ?5.8 3.6 ?4.0 100 Ankle - AH 9   20.7     Pop fossa AH 13.5  2.1  59.7 Pop fossa - Ankle 35 41 ?41 12.3             SNC    Nerve / Sites Rec. Site Peak Lat Ref.  Amp Ref. Segments Distance    ms ms V V  cm  L Sural - Ankle (Calf)     Calf Ankle 4.4 ?4.4 6 ?6 Calf - Ankle 14  R Sural - Ankle (Calf)     Calf Ankle 4.1 ?4.4 2 ?6 Calf - Ankle 14  L Superficial peroneal - Ankle     Lat leg Ankle 4.4 ?4.4 7 ?6 Lat leg - Ankle 14  R Superficial peroneal - Ankle  Lat leg Ankle NR ?4.4 NR ?6 Lat leg - Ankle 14             F  Wave    Nerve F Lat Ref.   ms ms  L Tibial - AH 58.9 ?56.0  R Tibial - AH 58.9 ?56.0         EMG Summary Table    Spontaneous MUAP Recruitment  Muscle IA Fib PSW Fasc Other Amp Dur. Poly Pattern  R. Vastus medialis Normal None None None _______ Normal Normal Normal Normal  R. Tibialis anterior Normal None None None _______ Increased Normal Normal Reduced  R. Gastrocnemius (Medial head) Normal None None None _______ Normal Normal Normal Normal

## 2019-09-20 ENCOUNTER — Telehealth: Payer: Self-pay

## 2019-09-20 DIAGNOSIS — Z96611 Presence of right artificial shoulder joint: Secondary | ICD-10-CM | POA: Diagnosis not present

## 2019-09-20 DIAGNOSIS — Y831 Surgical operation with implant of artificial internal device as the cause of abnormal reaction of the patient, or of later complication, without mention of misadventure at the time of the procedure: Secondary | ICD-10-CM | POA: Diagnosis not present

## 2019-09-20 DIAGNOSIS — Z96651 Presence of right artificial knee joint: Secondary | ICD-10-CM | POA: Insufficient documentation

## 2019-09-20 DIAGNOSIS — T8484XD Pain due to internal orthopedic prosthetic devices, implants and grafts, subsequent encounter: Secondary | ICD-10-CM | POA: Diagnosis not present

## 2019-09-20 DIAGNOSIS — G6289 Other specified polyneuropathies: Secondary | ICD-10-CM

## 2019-09-20 DIAGNOSIS — M25561 Pain in right knee: Secondary | ICD-10-CM | POA: Diagnosis not present

## 2019-09-20 DIAGNOSIS — Z471 Aftercare following joint replacement surgery: Secondary | ICD-10-CM | POA: Diagnosis not present

## 2019-09-20 NOTE — Telephone Encounter (Signed)
I called pt to discuss. No answer, left a message asking her to call me back. 

## 2019-09-20 NOTE — Progress Notes (Signed)
Please call and advise the patient that the recent EMG and nerve conduction velocity test, which is the electrical nerve and muscle test we we performed, was reported as mildly abnormal with signs of neuropathy/nerve damage seen in the right leg, not in the L. She may have the beginning of what we call peripheral neuropathy. Causes may include in her case, pre-diabetes. She has had blood work within the last couple of months. We could do some additional blood work if she is agreeable, to look for treatable causes of neuropathy.  Please let me know, I would like to add, IFE/PE, B6, B1, and Heavy metal (blood). Thanks,  Star Age, MD, PhD

## 2019-09-20 NOTE — Telephone Encounter (Signed)
-----   Message from Star Age, MD sent at 09/20/2019  7:31 AM EDT ----- Please call and advise the patient that the recent EMG and nerve conduction velocity test, which is the electrical nerve and muscle test we we performed, was reported as mildly abnormal with signs of neuropathy/nerve damage seen in the right leg, not in the L. She may have the beginning of what we call peripheral neuropathy. Causes may include in her case, pre-diabetes. She has had blood work within the last couple of months. We could do some additional blood work if she is agreeable, to look for treatable causes of neuropathy.  Please let me know, I would like to add, IFE/PE, B6, B1, and Heavy metal (blood). Thanks,  Star Age, MD, PhD

## 2019-09-21 NOTE — Addendum Note (Signed)
Addended by: Star Age on: 09/21/2019 04:40 PM   Modules accepted: Orders

## 2019-09-21 NOTE — Telephone Encounter (Signed)
Pt called back in regards to missed call please FU

## 2019-09-21 NOTE — Telephone Encounter (Signed)
I gave pt the results of the EMG. I stated Dr. Rexene Alberts wants to order labs for treatable cause of neuropathy. PT is in agreement to get labs tomorrow after 130pm. I stated to be here before 400pm. Pt saw orthopedics at [facility] yesterday and is wearing a brace on right leg and wanted Dr.Athar to see that. I explain to give 3 to 4 days for Ct scan results and lab work .She verbalized understanding.

## 2019-09-21 NOTE — Telephone Encounter (Signed)
Order for labs placed.

## 2019-09-23 ENCOUNTER — Other Ambulatory Visit: Payer: Medicare Other

## 2019-09-23 ENCOUNTER — Other Ambulatory Visit: Payer: Self-pay

## 2019-09-23 ENCOUNTER — Ambulatory Visit
Admission: RE | Admit: 2019-09-23 | Discharge: 2019-09-23 | Disposition: A | Discharge status: Home / Self Care | Payer: Medicare PPO | Source: Ambulatory Visit | Attending: Neurology | Admitting: Neurology

## 2019-09-23 DIAGNOSIS — R296 Repeated falls: Secondary | ICD-10-CM | POA: Diagnosis not present

## 2019-09-23 DIAGNOSIS — Z9181 History of falling: Secondary | ICD-10-CM

## 2019-09-23 DIAGNOSIS — M79605 Pain in left leg: Secondary | ICD-10-CM

## 2019-09-23 DIAGNOSIS — R269 Unspecified abnormalities of gait and mobility: Secondary | ICD-10-CM

## 2019-09-23 DIAGNOSIS — M79604 Pain in right leg: Secondary | ICD-10-CM

## 2019-09-26 ENCOUNTER — Telehealth: Payer: Self-pay

## 2019-09-26 ENCOUNTER — Other Ambulatory Visit (INDEPENDENT_AMBULATORY_CARE_PROVIDER_SITE_OTHER): Payer: Self-pay

## 2019-09-26 ENCOUNTER — Other Ambulatory Visit: Payer: Self-pay

## 2019-09-26 DIAGNOSIS — Z0289 Encounter for other administrative examinations: Secondary | ICD-10-CM

## 2019-09-26 DIAGNOSIS — G6289 Other specified polyneuropathies: Secondary | ICD-10-CM | POA: Diagnosis not present

## 2019-09-26 NOTE — Progress Notes (Signed)
Head CT wo contrast from 09/23/19 showed no acute findings; stable appearance of a small growth seen on CT from March 2019 (CT ordered by Vicie Mutters, PA, at the time). Likely a meningioma, size and appearance stable compared to 2 years ago.  Please update pt.

## 2019-09-26 NOTE — Telephone Encounter (Signed)
-----   Message from Star Age, MD sent at 09/26/2019  7:29 AM EDT ----- Head CT wo contrast from 09/23/19 showed no acute findings; stable appearance of a small growth seen on CT from March 2019 (CT ordered by Vicie Mutters, PA, at the time). Likely a meningioma, size and appearance stable compared to 2 years ago.  Please update pt.

## 2019-09-26 NOTE — Telephone Encounter (Signed)
I called pt and discussed her CT results and recommendations. Pt will come today for her additional labs order for neuropathy. Pt verbalized understanding of results. Pt had no questions at this time but was encouraged to call back if questions arise.

## 2019-09-27 DIAGNOSIS — Z96651 Presence of right artificial knee joint: Secondary | ICD-10-CM | POA: Diagnosis not present

## 2019-09-27 DIAGNOSIS — T8484XD Pain due to internal orthopedic prosthetic devices, implants and grafts, subsequent encounter: Secondary | ICD-10-CM | POA: Diagnosis not present

## 2019-09-27 DIAGNOSIS — M799 Soft tissue disorder, unspecified: Secondary | ICD-10-CM | POA: Diagnosis not present

## 2019-09-27 DIAGNOSIS — Z471 Aftercare following joint replacement surgery: Secondary | ICD-10-CM | POA: Diagnosis not present

## 2019-09-27 DIAGNOSIS — M7121 Synovial cyst of popliteal space [Baker], right knee: Secondary | ICD-10-CM | POA: Diagnosis not present

## 2019-09-27 DIAGNOSIS — M25861 Other specified joint disorders, right knee: Secondary | ICD-10-CM | POA: Diagnosis not present

## 2019-09-27 DIAGNOSIS — T8484XA Pain due to internal orthopedic prosthetic devices, implants and grafts, initial encounter: Secondary | ICD-10-CM | POA: Diagnosis not present

## 2019-10-02 LAB — MULTIPLE MYELOMA PANEL, SERUM
Albumin SerPl Elph-Mcnc: 3.6 g/dL (ref 2.9–4.4)
Albumin/Glob SerPl: 1.3 (ref 0.7–1.7)
Alpha 1: 0.2 g/dL (ref 0.0–0.4)
Alpha2 Glob SerPl Elph-Mcnc: 0.6 g/dL (ref 0.4–1.0)
B-Globulin SerPl Elph-Mcnc: 1.1 g/dL (ref 0.7–1.3)
Gamma Glob SerPl Elph-Mcnc: 0.9 g/dL (ref 0.4–1.8)
Globulin, Total: 2.8 g/dL (ref 2.2–3.9)
IgA/Immunoglobulin A, Serum: 258 mg/dL (ref 64–422)
IgG (Immunoglobin G), Serum: 1067 mg/dL (ref 586–1602)
IgM (Immunoglobulin M), Srm: 36 mg/dL (ref 26–217)
Total Protein: 6.4 g/dL (ref 6.0–8.5)

## 2019-10-02 LAB — HEAVY METALS PROFILE II, BLOOD
Arsenic: 7 ug/L (ref 2–23)
Cadmium: <0.5 ug/L (ref 0.0–1.2)
Lead, Blood: <1 ug/dL (ref 0–4)
Mercury: <1 ug/L (ref 0.0–14.9)

## 2019-10-02 LAB — VITAMIN B6: Vitamin B6: 5 ug/L (ref 2.0–32.8)

## 2019-10-02 LAB — SEDIMENTATION RATE: Sed Rate: 4 mm/hr (ref 0–40)

## 2019-10-02 LAB — ANA W/REFLEX: Anti Nuclear Antibody (ANA): NEGATIVE

## 2019-10-02 LAB — VITAMIN B1: Thiamine: 124.8 nmol/L (ref 66.5–200.0)

## 2019-10-03 ENCOUNTER — Telehealth: Payer: Self-pay

## 2019-10-03 DIAGNOSIS — G6289 Other specified polyneuropathies: Secondary | ICD-10-CM

## 2019-10-03 NOTE — Telephone Encounter (Signed)
-----   Message from Star Age, MD sent at 10/03/2019  8:06 AM EDT ----- Labs for neuropathy eval were normal; please update pt. No obv cause found for her mild Neuropathy. If she wishes, we can seek evaluation through a neuromuscular specialist for more in depth testing to look for rare causes and potential treatment options. I would be happy to put a referral in, if she would like to pursue this. Please let me know. We can refer to [place], [facility] or [facility].

## 2019-10-03 NOTE — Telephone Encounter (Signed)
I called pt to discuss. No answer, left a message asking her to call me back. 

## 2019-10-03 NOTE — Telephone Encounter (Signed)
Pt returned my call and I discussed her lab results and recommendations. Pt would like a referral to [facility] to a neuromuscular specialist. Pt verbalized understanding of results. Pt had no questions at this time but was encouraged to call back if questions arise.

## 2019-10-03 NOTE — Telephone Encounter (Signed)
Thank you :)

## 2019-10-03 NOTE — Progress Notes (Signed)
Labs for neuropathy eval were normal; please update pt. No obv cause found for her mild Neuropathy. If she wishes, we can seek evaluation through a neuromuscular specialist for more in depth testing to look for rare causes and potential treatment options. I would be happy to put a referral in, if she would like to pursue this. Please let me know. We can refer to [place], Kateri Mc or [facility].

## 2019-10-04 ENCOUNTER — Telehealth: Payer: Self-pay | Admitting: *Deleted

## 2019-10-04 NOTE — Telephone Encounter (Signed)
I have attempted to contact this patient by phone with the following results: left message to return my call on answering machine.

## 2019-10-04 NOTE — Telephone Encounter (Signed)
Patient returned call regarding her recent visit to [facility]. The patient was not admitted to a patient room, but had an MRI of her knee in the facility.  No hospital follow up visit is needed, per Dr Melford Aase.

## 2019-10-05 DIAGNOSIS — T8484XD Pain due to internal orthopedic prosthetic devices, implants and grafts, subsequent encounter: Secondary | ICD-10-CM | POA: Diagnosis not present

## 2019-10-05 DIAGNOSIS — Z96651 Presence of right artificial knee joint: Secondary | ICD-10-CM | POA: Diagnosis not present

## 2019-10-05 DIAGNOSIS — Z471 Aftercare following joint replacement surgery: Secondary | ICD-10-CM | POA: Diagnosis not present

## 2019-10-06 DIAGNOSIS — M545 Low back pain: Secondary | ICD-10-CM | POA: Diagnosis not present

## 2019-10-06 DIAGNOSIS — M961 Postlaminectomy syndrome, not elsewhere classified: Secondary | ICD-10-CM | POA: Diagnosis not present

## 2019-10-10 DIAGNOSIS — L439 Lichen planus, unspecified: Secondary | ICD-10-CM | POA: Diagnosis not present

## 2019-10-10 DIAGNOSIS — L299 Pruritus, unspecified: Secondary | ICD-10-CM | POA: Diagnosis not present

## 2019-10-10 DIAGNOSIS — L659 Nonscarring hair loss, unspecified: Secondary | ICD-10-CM | POA: Diagnosis not present

## 2019-10-10 DIAGNOSIS — B379 Candidiasis, unspecified: Secondary | ICD-10-CM | POA: Diagnosis not present

## 2019-10-11 ENCOUNTER — Ambulatory Visit: Payer: Medicare Other | Admitting: Internal Medicine

## 2019-10-11 ENCOUNTER — Encounter: Payer: Self-pay | Admitting: Adult Health Nurse Practitioner

## 2019-10-11 ENCOUNTER — Ambulatory Visit: Payer: Medicare PPO | Admitting: Adult Health Nurse Practitioner

## 2019-10-11 ENCOUNTER — Ambulatory Visit
Admission: RE | Admit: 2019-10-11 | Discharge: 2019-10-11 | Disposition: A | Discharge status: Home / Self Care | Payer: Medicare PPO | Source: Ambulatory Visit | Attending: Adult Health Nurse Practitioner | Admitting: Adult Health Nurse Practitioner

## 2019-10-11 ENCOUNTER — Other Ambulatory Visit: Payer: Self-pay

## 2019-10-11 VITALS — BP 112/68 | HR 89 | Temp 97.7°F | Ht 64.0 in | Wt 164.0 lb

## 2019-10-11 DIAGNOSIS — R0602 Shortness of breath: Secondary | ICD-10-CM | POA: Diagnosis not present

## 2019-10-11 DIAGNOSIS — R05 Cough: Secondary | ICD-10-CM

## 2019-10-11 DIAGNOSIS — H6063 Unspecified chronic otitis externa, bilateral: Secondary | ICD-10-CM

## 2019-10-11 DIAGNOSIS — I10 Essential (primary) hypertension: Secondary | ICD-10-CM

## 2019-10-11 DIAGNOSIS — R059 Cough, unspecified: Secondary | ICD-10-CM

## 2019-10-11 DIAGNOSIS — R300 Dysuria: Secondary | ICD-10-CM | POA: Diagnosis not present

## 2019-10-11 DIAGNOSIS — J40 Bronchitis, not specified as acute or chronic: Secondary | ICD-10-CM

## 2019-10-11 LAB — URINALYSIS W MICROSCOPIC + REFLEX CULTURE
Bacteria, UA: NONE SEEN /HPF
Bilirubin Urine: NEGATIVE
Glucose, UA: NEGATIVE
Hgb urine dipstick: NEGATIVE
Hyaline Cast: NONE SEEN /LPF
Ketones, ur: NEGATIVE
Leukocyte Esterase: NEGATIVE
Nitrites, Initial: NEGATIVE
Protein, ur: NEGATIVE
Specific Gravity, Urine: 1.013 (ref 1.001–1.03)
pH: 6 (ref 5.0–8.0)

## 2019-10-11 LAB — CBC WITH DIFFERENTIAL/PLATELET
Absolute Monocytes: 924 cells/uL (ref 200–950)
Basophils Absolute: 69 cells/uL (ref 0–200)
Basophils Relative: 0.9 %
Eosinophils Absolute: 62 cells/uL (ref 15–500)
Eosinophils Relative: 0.8 %
HCT: 42.9 % (ref 35.0–45.0)
Hemoglobin: 14.6 g/dL (ref 11.7–15.5)
Lymphs Abs: 1140 cells/uL (ref 850–3900)
MCH: 30.9 pg (ref 27.0–33.0)
MCHC: 34 g/dL (ref 32.0–36.0)
MCV: 90.7 fL (ref 80.0–100.0)
MPV: 9.1 fL (ref 7.5–12.5)
Monocytes Relative: 12 %
Neutro Abs: 5506 cells/uL (ref 1500–7800)
Neutrophils Relative %: 71.5 %
Platelets: 260 10*3/uL (ref 140–400)
RBC: 4.73 10*6/uL (ref 3.80–5.10)
RDW: 12.3 % (ref 11.0–15.0)
Total Lymphocyte: 14.8 %
WBC: 7.7 10*3/uL (ref 3.8–10.8)

## 2019-10-11 LAB — COMPLETE METABOLIC PANEL WITH GFR
AG Ratio: 1.5 (calc) (ref 1.0–2.5)
ALT: 10 U/L (ref 6–29)
AST: 15 U/L (ref 10–35)
Albumin: 4 g/dL (ref 3.6–5.1)
Alkaline phosphatase (APISO): 44 U/L (ref 37–153)
BUN: 15 mg/dL (ref 7–25)
CO2: 35 mmol/L — ABNORMAL HIGH (ref 20–32)
Calcium: 9.8 mg/dL (ref 8.6–10.4)
Chloride: 99 mmol/L (ref 98–110)
Creat: 0.8 mg/dL (ref 0.60–0.93)
GFR, Est African American: 86 mL/min/{1.73_m2} (ref >=60)
GFR, Est Non African American: 74 mL/min/{1.73_m2} (ref >=60)
Globulin: 2.7 g/dL (calc) (ref 1.9–3.7)
Glucose, Bld: 81 mg/dL (ref 65–99)
Potassium: 4.6 mmol/L (ref 3.5–5.3)
Sodium: 140 mmol/L (ref 135–146)
Total Bilirubin: 0.4 mg/dL (ref 0.2–1.2)
Total Protein: 6.7 g/dL (ref 6.1–8.1)

## 2019-10-11 LAB — NO CULTURE INDICATED

## 2019-10-11 NOTE — Progress Notes (Addendum)
Assessment and Plan:  Crystal [name] was seen today for cough.  Diagnoses and all orders for this visit:  Essential hypertension Continue current medications, controlled today Monitor blood pressure at home; call if consistently over 130/80 Continue DASH diet.   Reminder to go to the ER if any CP, SOB, nausea, dizziness, severe HA, changes vision/speech, left arm numbness and tingling and jaw pain.  -     CBC with Differential/Platelet -     COMPLETE METABOLIC PANEL WITH GFR  Exertional shortness of breath Rule out any pneumonias or acute process -     DG Chest 2 View; Future  Dysuria Increase water intake -     Urinalysis w microscopic + reflex cultur  Bronchitis Treat symptoms, tessalon pearls, mucinex -     benzonatate (TESSALON) 200 MG capsule; Take 1 cap three times daily as needed for cough. Increase water intake   Monitor symptoms  Chronic otitis externa of both ears, unspecified type -     NEOMYCIN-POLYMYXIN-HYDROCORTISONE (CORTISPORIN) 1 % SOLN OTIC solution; Place 3 drops into both ears 4 (four) times daily.  Cough -     benzonatate (TESSALON) 200 MG capsule; Take 1 cap three times daily as needed for cough.       Further disposition pending results of labs. Discussed med's effects and SE's.   Over 30 minutes of face to face interview, exam, counseling, chart review, and critical decision making was performed.   Future Appointments  Date Time Provider Crystal [name]  11/16/2019  3:00 PM [name], NP [facility] None  03/20/2020 10:00 AM Crystal Pinto, MD [facility] None    ------------------------------------------------------------------------------------------------------------------   HPI 69 y.o.female presents for evaluation of symptoms after COVID19 vaccine, one day ago.  This is the second dose to complete her series.  She reports that started feeling bad yesterday.  She has had increase in cough and congestion since.  She denies any fever or  chills.  Reports she did have flushed face yesterday after vaccination that resolved without intervention.  She is taking benzonate for cough which is helping.  She reports her cough is productive at times.  Reports shortness of breath with exertion.  She is also having nasal congestion.  She does have a dry throat which she feels is increasing her coughing.  She is taking Humira and last injection was three days ago, Sunday.  Went to [facility], lichen planus and yeast infection. Flagyl three times a day (Metronidazole)  And anther medication , she can not rememeber the name of this. Reports this has improved.  PMH listed below and is extensive in nature with multiple specialists contributing to her care w/ polypharmacy.  Past Medical History:  Diagnosis Date  . Arthritis   . ASCVD (arteriosclerotic cardiovascular disease)   . Closed nondisplaced subtrochanteric fracture of left femur ([place]) 09/03/2017   September 2018.  Status post surgery by Dr. Marcelino Scot  . Depression   . Fibromyalgia   . Gait disorder 10/26/2012  . GERD (gastroesophageal reflux disease)   . GI bleed   . Hyperlipidemia   . Hypertension   . Hypothyroid   . Periprosthetic supracondylar fracture of femur, left  03/23/2017  . Restless leg syndrome   . Rhinitis 06/25/2010  . Sleep apnea   . TIA (transient ischemic attack)    3        . Vitamin D deficiency      Allergies  Allergen Reactions  . Atorvastatin Other (See Comments)    Extreme pain  .  Codeine Other (See Comments)    Headache  . Pravastatin Other (See Comments)    Exreme pain  . Statins Other (See Comments)    Extreme pain  . Amitriptyline Other (See Comments)    Unspecified  . Fish Oil Nausea Only  . Hydrocodone Other (See Comments)    Itching Other Reaction: Other reaction   . Linseed Oil Other (See Comments)    Other Reaction: Other reaction  . Nuvigil [Armodafinil] Other (See Comments)    Unspecified  . Sulfa Antibiotics Other (See Comments)  .  Erythromycin Other (See Comments)    Reaction unspecified  . Erythromycin Base Rash  . Flax Seed [Bio-Flax] Rash    Linseed  . Hydrocodone-Acetaminophen Rash  . Morphine Nausea And Vomiting and Rash  . Nsaids Nausea Only and Other (See Comments)    Other Reaction: Other reaction  . Sertraline Rash  . Sinemet [Carbidopa W-Levodopa] Rash  . Sulfamethoxazole Rash  . Tolmetin Nausea Only    Current Outpatient Medications on File Prior to Visit  Medication Sig  . Adalimumab 40 MG/0.4ML PNKT Inject into the skin.  [name] aspirin EC 81 MG tablet Take 81 mg by mouth daily.  . bisoprolol-hydrochlorothiazide (ZIAC) 5-6.25 MG tablet Take 1 tablet by mouth 2 (two) times daily. Take 1 tablet 2 x /day for BP  . buPROPion (WELLBUTRIN XL) 300 MG 24 hr tablet Take 1 tablet Daily for Mood, Focus & Concentration  . Calcium Citrate-Vitamin D (CALCIUM CITRATE + PO) Take by mouth.  . Cyanocobalamin (B-12 SL) Place under the tongue daily.  . DULoxetine (CYMBALTA) 60 MG capsule Take 1 capsule Daily for Chronic Pain  . famotidine (PEPCID) 40 MG tablet Take 40 mg by mouth daily.  . fenofibrate micronized (LOFIBRA) 134 MG capsule TAKE 1 CAPSULE BY MOUTH EVERY DAY BEFORE BREAKFAST  . ferrous sulfate 325 (65 FE) MG tablet Take 325 mg by mouth daily.   . fluconazole (DIFLUCAN) 100 MG tablet TAKE 1 TABLET BY MOUTH EVERY DAY (Patient taking differently: 1 per week)  . fluticasone (FLONASE) 50 MCG/ACT nasal spray USE TWO SPRAYS IN EACH NOSTRIL DAILY AS NEEDED  . gabapentin (NEURONTIN) 300 MG capsule Take 1 capsule 3 x/day for Chronic Pain  . levothyroxine (SYNTHROID) 100 MCG tablet TAKE 1 TABLET BY MOUTH ON TUES, WED, THURS, SAT AND SUN. TAKE 1& 1/2 TABLET ON MON AND FRI  . lidocaine (XYLOCAINE) 2 % jelly apply small amount topically to affected area 2-3 times a day, knees, legs.  . meclizine (ANTIVERT) 25 MG tablet TAKE 1 TABLET (25 MG TOTAL) BY MOUTH 3 (THREE) TIMES DAILY AS NEEDED FOR DIZZINESS.  [name] metroNIDAZOLE  (FLAGYL) 500 MG tablet Take 500 mg by mouth 2 (two) times daily.  [name] oxybutynin (DITROPAN) 5 MG tablet TAKE 1 TABLET 2 X /DAY FOR BLADDER CONTROL  . Phenylephrine-guaiFENesin (MUCUS RELIEF D PO) Take by mouth daily.  . polyethylene glycol (MIRALAX / GLYCOLAX) packet Take 17 g by mouth daily as needed for mild constipation.  [name] PREBIOTIC PRODUCT PO Take by mouth.  . Probiotic Product (PROBIOTIC DAILY PO) Take by mouth daily.  [name] Respiratory Therapy Supplies (FLUTTER) DEVI Use flutter valve 3 times a day  . rOPINIRole (REQUIP) 2 MG tablet Take 1 tablet 4 x /day for Restless Legs  . tiZANidine (ZANAFLEX) 2 MG tablet Take 1/2 to 1 tablet 3 x /day as needed for Muscle spasm  . TRAMADOL HCL ER PO Take by mouth as needed.   . diclofenac sodium (VOLTAREN)  1 % GEL Apply 2 g topically 2 (two) times daily as needed.   [name] DIGESTIVE ENZYMES PO Take by mouth 2 (two) times daily.   No current facility-administered medications on file prior to visit.    ROS: all negative except above.   Physical Exam:  BP 112/68   Pulse 89   Temp 97.7 F (36.5 C)   Ht 5' 4"  (1.626 m)   Wt 164 lb (74.4 kg)   SpO2 96%   BMI 28.15 kg/m   General Appearance: Well nourished, in no apparent distress. Eyes: PERRLA, EOMs, conjunctiva no swelling or erythema Sinuses: No Frontal/maxillary tenderness ENT/Mouth: Ext aud canals clear, erythematous canals bilaterally.  Auricle tenderness. TMs without erythema, bulging. No erythema, swelling, or exudate on post pharynx.  Tonsils not swollen or erythematous. Hearing normal.  Neck: Supple, thyroid normal.  Respiratory: Respiratory effort normal, BS equal bilaterally without rales, wheezing or stridor.  Some rhonchi noted bilateral upper lobes. Cardio: RRR with no MRGs. Brisk peripheral pulses without edema.  Abdomen: Soft, + BS.  Non tender, no guarding, rebound, hernias, masses. Lymphatics: Non tender without lymphadenopathy.  Musculoskeletal: Full ROM, 5/5 strength, normal gait.   Skin: Warm, dry without rashes, lesions, ecchymosis.  Neuro: Cranial nerves intact. Normal muscle tone, no cerebellar symptoms. Sensation intact.  Psych: Awake and oriented X 3, normal affect, Insight and Judgment appropriate.     Garnet Sierras, NP 12:15 PM [facility]

## 2019-10-12 MED ORDER — NEOMYCIN-POLYMYXIN-HC 1 % OT SOLN: 3.0000 [drp] | Freq: Four times a day (QID) | OTIC | 0 refills | Status: DC

## 2019-10-12 MED ORDER — BENZONATATE 200 MG PO CAPS: ORAL_CAPSULE | ORAL | 1 refills | Status: DC

## 2019-10-17 ENCOUNTER — Telehealth: Payer: Self-pay

## 2019-10-17 ENCOUNTER — Other Ambulatory Visit: Payer: Self-pay | Admitting: Adult Health

## 2019-10-17 DIAGNOSIS — J471 Bronchiectasis with (acute) exacerbation: Secondary | ICD-10-CM

## 2019-10-17 MED ORDER — AZITHROMYCIN 250 MG PO TABS: ORAL_TABLET | ORAL | 1 refills | Status: AC

## 2019-10-17 NOTE — Telephone Encounter (Signed)
Left message on voicemail letting patient know that an antibiotic was sent to the pharmacy

## 2019-10-17 NOTE — Telephone Encounter (Signed)
Patient recently saw Danton Sewer and states that she is still coughing badly and very congested. Please advise.

## 2019-10-18 DIAGNOSIS — Z20828 Contact with and (suspected) exposure to other viral communicable diseases: Secondary | ICD-10-CM | POA: Diagnosis not present

## 2019-10-18 DIAGNOSIS — U071 COVID-19: Secondary | ICD-10-CM | POA: Diagnosis not present

## 2019-10-18 DIAGNOSIS — Z03818 Encounter for observation for suspected exposure to other biological agents ruled out: Secondary | ICD-10-CM | POA: Diagnosis not present

## 2019-10-19 ENCOUNTER — Telehealth: Payer: Self-pay | Admitting: *Deleted

## 2019-10-19 ENCOUNTER — Encounter: Payer: Self-pay | Admitting: *Deleted

## 2019-10-19 ENCOUNTER — Other Ambulatory Visit: Payer: Self-pay | Admitting: Internal Medicine

## 2019-10-19 DIAGNOSIS — R05 Cough: Secondary | ICD-10-CM

## 2019-10-19 DIAGNOSIS — R059 Cough, unspecified: Secondary | ICD-10-CM

## 2019-10-19 DIAGNOSIS — U071 COVID-19: Secondary | ICD-10-CM | POA: Insufficient documentation

## 2019-10-19 MED ORDER — PROMETHAZINE-DM 6.25-15 MG/5ML PO SYRP: ORAL_SOLUTION | ORAL | 1 refills | Status: DC

## 2019-10-19 NOTE — Telephone Encounter (Signed)
Patient called and states she tested positive for Covid at [facility] on 10/18/2019.  She reports no fever, has cough and short of breath, especially after coughing. States she is not gasping for breath, but is weak. Per Dr Melford Aase, continue, Vitamin D, add Vitamin C 1000 mg twice daily and add Zinc 50 mg daily. Patient advised to purchase a pulse OX and check her oxygen and if the level is below 90%, or she is gasing for breath, go to the ED. Dr Melford Aase also recommended patient to continue Tessalon perles and can also take Tramadol to help relieve the cough. Dr Melford Aase is also sending in an RX for a cough medication. Patient is aware.

## 2019-10-21 ENCOUNTER — Encounter (INDEPENDENT_AMBULATORY_CARE_PROVIDER_SITE_OTHER): Payer: Self-pay

## 2019-10-22 ENCOUNTER — Encounter (INDEPENDENT_AMBULATORY_CARE_PROVIDER_SITE_OTHER): Payer: Self-pay

## 2019-10-24 ENCOUNTER — Telehealth: Payer: Self-pay | Admitting: *Deleted

## 2019-10-24 ENCOUNTER — Other Ambulatory Visit: Payer: Self-pay | Admitting: Internal Medicine

## 2019-10-24 DIAGNOSIS — I10 Essential (primary) hypertension: Secondary | ICD-10-CM

## 2019-10-24 MED ORDER — BISOPROLOL FUMARATE 10 MG PO TABS: ORAL_TABLET | ORAL | 0 refills | Status: DC

## 2019-10-24 NOTE — Telephone Encounter (Signed)
Patient called and reported she is taking Bisoprolol 10-6.25 mg two tables daily.  She states she is having some dizziness. Per Dr Melford Aase, the patient was advised to reduce the medication to 1 tablet daily.  She was also advised that she can finish current tablets, but the next refill will be for Zebeta 10 mg, because the HCTZ 6.25 is such a low dose, it does not prevent fluid retention.

## 2019-10-25 ENCOUNTER — Ambulatory Visit: Payer: Medicare Other | Admitting: Adult Health

## 2019-10-26 ENCOUNTER — Ambulatory Visit: Payer: Medicare PPO | Admitting: Adult Health

## 2019-10-31 ENCOUNTER — Other Ambulatory Visit: Payer: Self-pay | Admitting: Internal Medicine

## 2019-10-31 DIAGNOSIS — K219 Gastro-esophageal reflux disease without esophagitis: Secondary | ICD-10-CM

## 2019-10-31 MED ORDER — PANTOPRAZOLE SODIUM 40 MG PO TBEC: DELAYED_RELEASE_TABLET | ORAL | 0 refills | Status: DC

## 2019-11-04 ENCOUNTER — Telehealth: Payer: Self-pay | Admitting: *Deleted

## 2019-11-04 DIAGNOSIS — G4733 Obstructive sleep apnea (adult) (pediatric): Secondary | ICD-10-CM | POA: Diagnosis not present

## 2019-11-04 NOTE — Telephone Encounter (Signed)
Called and left a message to return my call, but patient did not respond. A message was left to inform the patient to call the office number, since the office is currently closed, and the night service will contact Dr Melford Aase.

## 2019-11-12 ENCOUNTER — Other Ambulatory Visit: Payer: Self-pay | Admitting: Internal Medicine

## 2019-11-16 ENCOUNTER — Ambulatory Visit (INDEPENDENT_AMBULATORY_CARE_PROVIDER_SITE_OTHER): Payer: Medicare PPO | Admitting: Adult Health

## 2019-11-16 ENCOUNTER — Encounter: Payer: Self-pay | Admitting: Adult Health

## 2019-11-16 ENCOUNTER — Other Ambulatory Visit: Payer: Self-pay

## 2019-11-16 VITALS — BP 110/70 | HR 106 | Temp 96.9°F | Ht 64.0 in | Wt 163.0 lb

## 2019-11-16 DIAGNOSIS — E785 Hyperlipidemia, unspecified: Secondary | ICD-10-CM | POA: Diagnosis not present

## 2019-11-16 DIAGNOSIS — Z8673 Personal history of transient ischemic attack (TIA), and cerebral infarction without residual deficits: Secondary | ICD-10-CM

## 2019-11-16 DIAGNOSIS — J479 Bronchiectasis, uncomplicated: Secondary | ICD-10-CM | POA: Diagnosis not present

## 2019-11-16 DIAGNOSIS — F3341 Major depressive disorder, recurrent, in partial remission: Secondary | ICD-10-CM

## 2019-11-16 DIAGNOSIS — R6889 Other general symptoms and signs: Secondary | ICD-10-CM | POA: Diagnosis not present

## 2019-11-16 DIAGNOSIS — K648 Other hemorrhoids: Secondary | ICD-10-CM | POA: Diagnosis not present

## 2019-11-16 DIAGNOSIS — E1169 Type 2 diabetes mellitus with other specified complication: Secondary | ICD-10-CM

## 2019-11-16 DIAGNOSIS — G2581 Restless legs syndrome: Secondary | ICD-10-CM | POA: Diagnosis not present

## 2019-11-16 DIAGNOSIS — Z860101 Personal history of adenomatous and serrated colon polyps: Secondary | ICD-10-CM

## 2019-11-16 DIAGNOSIS — N182 Chronic kidney disease, stage 2 (mild): Secondary | ICD-10-CM | POA: Diagnosis not present

## 2019-11-16 DIAGNOSIS — M8580 Other specified disorders of bone density and structure, unspecified site: Secondary | ICD-10-CM

## 2019-11-16 DIAGNOSIS — Z86010 Personal history of colonic polyps: Secondary | ICD-10-CM

## 2019-11-16 DIAGNOSIS — K219 Gastro-esophageal reflux disease without esophagitis: Secondary | ICD-10-CM

## 2019-11-16 DIAGNOSIS — M797 Fibromyalgia: Secondary | ICD-10-CM

## 2019-11-16 DIAGNOSIS — E119 Type 2 diabetes mellitus without complications: Secondary | ICD-10-CM

## 2019-11-16 DIAGNOSIS — Z79899 Other long term (current) drug therapy: Secondary | ICD-10-CM

## 2019-11-16 DIAGNOSIS — E559 Vitamin D deficiency, unspecified: Secondary | ICD-10-CM

## 2019-11-16 DIAGNOSIS — I10 Essential (primary) hypertension: Secondary | ICD-10-CM | POA: Diagnosis not present

## 2019-11-16 DIAGNOSIS — G4733 Obstructive sleep apnea (adult) (pediatric): Secondary | ICD-10-CM

## 2019-11-16 DIAGNOSIS — E1122 Type 2 diabetes mellitus with diabetic chronic kidney disease: Secondary | ICD-10-CM

## 2019-11-16 DIAGNOSIS — E66811 Obesity, class 1: Secondary | ICD-10-CM

## 2019-11-16 DIAGNOSIS — D513 Other dietary vitamin B12 deficiency anemia: Secondary | ICD-10-CM

## 2019-11-16 DIAGNOSIS — Z9181 History of falling: Secondary | ICD-10-CM

## 2019-11-16 DIAGNOSIS — Z0001 Encounter for general adult medical examination with abnormal findings: Secondary | ICD-10-CM | POA: Diagnosis not present

## 2019-11-16 DIAGNOSIS — K5000 Crohn's disease of small intestine without complications: Secondary | ICD-10-CM

## 2019-11-16 DIAGNOSIS — E669 Obesity, unspecified: Secondary | ICD-10-CM

## 2019-11-16 DIAGNOSIS — I2510 Atherosclerotic heart disease of native coronary artery without angina pectoris: Secondary | ICD-10-CM | POA: Diagnosis not present

## 2019-11-16 DIAGNOSIS — Z0000 Encounter for general adult medical examination without abnormal findings: Secondary | ICD-10-CM

## 2019-11-16 NOTE — Progress Notes (Addendum)
MEDICARE ANNUAL WELLNESS VISIT AND FOLLOW UP  Assessment:    Medicare annual wellness visit, subsequent -due next year - DEXA order placed, patient will schedule with mammogram  Essential hypertension -well controlled currently -monitor at home -dash diet -exercise as tolerated   ASCVD (arteriosclerotic cardiovascular disease) -cont to maximize cholesterol management and BP management   Obstructive sleep apnea -emphasized adherence with CPAP    Allergic rhinitis, unspecified allergic rhinitis type -cont meds - suggested she rotate antihistamine q3-6 months  Lifestyle controlled diabetes ([place]) Discussed general issues about diabetes pathophysiology and management., Educational material distributed., Suggested low cholesterol diet., Encouraged aerobic exercise., Discussed foot care., Reminded to get yearly retinal exam.  - Hemoglobin A1c at routine OVs in office q52m  Hypothyroidism, unspecified hypothyroidism type Hypothyroidism-check TSH level, continue medications the same, reminded to take on an empty stomach 30-647ms before food.  - TSH   Hyperlipidemia associated with T2DM (HCC) - statin intolerant, fair control with LDL <100,  -continue medications, check lipids, decrease fatty foods, increase activity.  - Lipid panel at q3m57m  CKD 2 associated with T2DM (HCC) Increase fluids, avoid NSAIDS, monitor sugars, will monitor  Vitamin D deficiency -cont supplement  Medication management - CBC, CMP/GFR, magnesium routinely  Gastroesophageal reflux disease, esophagitis presence not specified -cont meds  Crohn's disease with complication, unspecified gastrointestinal tract [facility] ([facility] MedEarlean Shawlmproved with humira   IBS (irritable colon syndrome) -followed by Dr. MedEarlean Shawlibromyalgia -followed by Dr. HarMaryjean KaRESTLESS LEG SYNDROME -followed by Dr. HarMaryjean Kaontinue requip   Depression, remission partial, recurrent ([place]cont current  meds   Cervical pain -followed by neurosurgery  Spinal stenosis in cervical region -followed by neurosurgery  Anemia due to vitamin B12 deficiency, unspecified B12 deficiency type Continue supplement  Insomnia, unspecified type Sleep hygiene discussed  Osteopenia - get dexa 03/2020, continue Vit D and Ca, weight bearing exercises  Overweight  Long discussion about weight loss, diet, and exercise Discussed goal weight and initial weight loss goal (<170lb) Patient will work on limiting access to unhealthy snacking foods in the evening, have easily accessible "healthy" options  Bronchiectasis without complication (H[place]onitor; continue guaifenacin  Increased risk falls/unsteady gait Increased risk due to joint and gait instability, complex ortho/neuro hx Has benefited from PT in the past - PT referral placed   Polypharmacy Reviewed and discontinued several supplements, suggested trial to taper off oxybutynin (taking for excess sweating) due to complaints of dry mouth with numerous cavities last year. Continue regular med reviews and d/c meds when possible.   Over 30 minutes of exam, counseling, chart review, and critical decision making was performed  Future Appointments  Date Time Provider DepIdanha/21/2021 10:00 AM McKUnk PintoD [facility] None    Plan:   During the course of the visit the patient was educated and counseled about appropriate screening and preventive services including:    Pneumococcal vaccine   Influenza vaccine  Td vaccine  Prevnar 13  Screening electrocardiogram  Screening mammography  Bone densitometry screening  Colorectal cancer screening  Diabetes screening  Glaucoma screening  Nutrition counseling   Advanced directives: given info/requested copies   Subjective:   Crystal [name] a 69 23o. female who presents for Medicare Annual Wellness Visit and 3 month OV for diabetes, htn,  hyperlipidemia, CKD, vitamin D.  Patient has OSA & does not use her CPAP/BiPAP, she is aware of need, states will work on compliance.   She has a  Chronic Pain Syndrome consequent of multiple Neck & back surgeries & followed by pain management. She had R shoulder total replacement scheduled for 11/15/2018 by Dr. Donnie Coffin through [place].   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   She has Crohn's managed by Dr. Earlean Shawl, now on humira without recent flares.    BMI is Body mass index is 27.98 kg/m., she has been working on diet, was reportedly down to 170 lb but is struggling since budesonide treatment. Exercise is limited due to ongoing multiple orthopedic concerns has had multiple falls this past year, reqeusting PT. She has been on phentermine in the past that she found very helpful, was d/c'd last year after she hit a plateau and there wasn't progress. She would like to restart. She is watching her diet closely, but struggles with "munchies" at night.  Wt Readings from Last 3 Encounters:  11/16/19 163 lb (73.9 kg)  10/11/19 164 lb (74.4 kg)  08/09/19 160 lb 5 oz (72.7 kg)   Her blood pressure has been controlled at home, today their BP is BP: 110/70 She does not workout, she has upcoming shoulder replacement, seeing specialist.   She denies chest pain, shortness of breath, dizziness.   She is on cholesterol medication (fenofibrate, hx of statin intolerance) and denies myalgias. Her cholesterol is not at goal of LDL <70. The cholesterol last visit was:   Lab Results  Component Value Date   CHOL 162 07/21/2019   HDL 45 (L) 07/21/2019   LDLCALC 87 07/21/2019   TRIG 207 (H) 07/21/2019   CHOLHDL 3.6 07/21/2019   She has been working on diet for lifestyle controlled diabetes (A1C 6.6%, 6.7% in 2016), and denies foot ulcerations, increased appetite, nausea, paresthesia of the feet, polydipsia, polyuria, visual disturbances, vomiting and weight loss.  Last A1C in  the office was:  Lab Results  Component Value Date   HGBA1C 6.3 (H) 07/21/2019   She has CKD II associated with lifestyle controlled diabetes:  Lab Results  Component Value Date   GFRNONAA 74 10/11/2019   Patient is on Vitamin D supplement, has reduced from 50000 three days a week to 10000 IU daily  Lab Results  Component Value Date   VD25OH 100 07/21/2019        Medication Review Current Outpatient Medications on File Prior to Visit  Medication Sig  . Adalimumab 40 MG/0.4ML PNKT Inject into the skin.  [name] aspirin EC 81 MG tablet Take 81 mg by mouth daily.  . bisoprolol (ZEBETA) 10 MG tablet Take 1 tablet Daily for BP  . buPROPion (WELLBUTRIN XL) 300 MG 24 hr tablet Take 1 tablet Daily for Mood, Focus & Concentration  . Cyanocobalamin (B-12 SL) Place under the tongue daily.  . diclofenac sodium (VOLTAREN) 1 % GEL Apply 2 g topically 2 (two) times daily as needed.   . DULoxetine (CYMBALTA) 60 MG capsule Take 1 capsule Daily for Chronic Pain  . fenofibrate micronized (LOFIBRA) 134 MG capsule TAKE 1 CAPSULE BY MOUTH EVERY DAY BEFORE BREAKFAST  . fluticasone (FLONASE) 50 MCG/ACT nasal spray USE TWO SPRAYS IN EACH NOSTRIL DAILY AS NEEDED  . gabapentin (NEURONTIN) 300 MG capsule Take 1 capsule 3 x/day for Chronic Pain  . levothyroxine (SYNTHROID) 100 MCG tablet TAKE 1 TABLET BY MOUTH ON TUES, WED, THURS, SAT AND SUN. TAKE 1& 1/2 TABLET ON MON AND FRI (Patient taking differently: daily. )  . lidocaine (XYLOCAINE) 2 % jelly apply small amount topically to affected  area 2-3 times a day, knees, legs.  . meclizine (ANTIVERT) 25 MG tablet TAKE 1 TABLET (25 MG TOTAL) BY MOUTH 3 (THREE) TIMES DAILY AS NEEDED FOR DIZZINESS.  [name] metroNIDAZOLE (FLAGYL) 500 MG tablet Take 500 mg by mouth See admin instructions. Takes 1 tab three days a week  . NEOMYCIN-POLYMYXIN-HYDROCORTISONE (CORTISPORIN) 1 % SOLN OTIC solution Place 3 drops into both ears 4 (four) times daily.  [name] oxybutynin (DITROPAN) 5 MG tablet  TAKE 1 TABLET 2 X /DAY FOR BLADDER CONTROL  . pantoprazole (PROTONIX) 40 MG tablet Take 1 tablet Daily for Indigestion & Acid Reflux  . Phenylephrine-guaiFENesin (MUCUS RELIEF D PO) Take by mouth daily.  . polyethylene glycol (MIRALAX / GLYCOLAX) packet Take 17 g by mouth daily as needed for mild constipation.  [name] Respiratory Therapy Supplies (FLUTTER) DEVI Use flutter valve 3 times a day  . rOPINIRole (REQUIP) 2 MG tablet Take 1 tablet 4 x /day for Restless Legs  . tiZANidine (ZANAFLEX) 2 MG tablet TAKE 1/2 TO 1 TABLET 3 TIMES A DAY AS NEEDED FOR MUSCLE SPASMS  . TRAMADOL HCL ER PO Take by mouth as needed.    No current facility-administered medications on file prior to visit.     Current Problems (verified) Patient Active Problem List   Diagnosis Date Noted  . CKD stage 2 due to type 2 diabetes mellitus ([place]) 11/16/2019  . COVID-19 virus infection 10/19/2019  . History of recurrent TIAs 11/15/2018  . Rupture of popliteal cyst of right knee region (complex cyst) 11/03/2018  . Diet-controlled type 2 diabetes mellitus ([place]) 07/04/2018  . History of adenomatous polyp of colon 10/18/2017  . Bronchiectasis without complication ([place]) 73/41/9379  . Diverticulosis 10/12/2017  . Internal hemorrhoids 10/12/2017  . Polypharmacy 04/09/2017  . Arthritis of carpometacarpal (CMC) joints of both thumbs 02/12/2017  . Cervical disc disease 01/23/2017  . Constipation 01/23/2017  . Crohn's ileitis ([place]) 01/23/2017  . S/P revision of total knee, left 12/18/2016  . B12 deficiency anemia 06/16/2016  . Sleep talking 03/26/2016  . IBS (irritable colon syndrome) 08/09/2015  . Spinal stenosis in cervical region 06/14/2015  . Other specified arthropathy involving shoulder region 11/23/2014  . Obesity (BMI 30.0-34.9) 10/31/2014  . Medication management 10/25/2013  . Rotator cuff arthropathy 09/15/2013  . Hyperlipidemia associated with type 2 diabetes mellitus ([place])   . Hypothyroid   . Depression, major,  recurrent, in partial remission ([place])   . Vitamin D deficiency   . ASCVD   . Gait disorder 10/26/2012  . Knee pain 09/07/2012  . Rotator cuff tear 08/23/2012  . Bilateral bunions 11/19/2011  . Rhinitis 06/25/2010  . Obstructive sleep apnea 06/21/2010  . RESTLESS LEG SYNDROME 06/21/2010  . Essential hypertension 06/21/2010  . GERD 06/21/2010  . Crohn's Enterocolitis / Regional enteritis 06/21/2010  . Fibromyalgia 06/21/2010    Screening Tests Immunization History  Administered Date(s) Administered  . DT (Pediatric) 01/03/2015  . Influenza Split 04/16/2018  . Influenza Whole 03/30/2010  . Influenza, High Dose Seasonal PF 03/13/2015, 07/21/2019  . Influenza-Unspecified 03/31/2011, 05/14/2014, 02/27/2016  . Pneumococcal Conjugate-13 04/17/2015, 01/23/2019  . Pneumococcal Polysaccharide-23 01/06/2017  . Pneumococcal-Unspecified 06/30/2005  . Td 06/30/2004  . Zoster 07/01/2007    Preventative care: Last colonoscopy: Dr. Earlean Shawl, 09/2017 Last mammogram: 05/2019 DEXA: 03/2018 T -1.2 , follow up due 03/2020, order entered, patient will schedule with mammogram  Sleep study 04/2016    Prior vaccinations: TD or Tdap: 2016 Influenza: 07/2019 Pneumococcal: 2018 Prevnar13: 2016 Shingles/Zostavax: 2009 covid 19: 2/2,  2021 will bring card to document  Names of Other Physician/Practitioners you currently use: 1. [facility]- here for primary care 2. Dr. Nori Riis ophthalmology, eye doctor, last visit 2021 3. Dr. Mariea Clonts, dentist, last visit 2021 q 6 months   Patient Care Team: Unk Pinto, MD as PCP - General (Internal Medicine) Star Age, MD as Attending Physician (Neurology) Rozetta Nunnery, MD as Consulting Physician (Otolaryngology) Richmond Campbell, MD as Consulting Physician (Gastroenterology) Erline Levine, MD as Consulting Physician (Neurosurgery) Katy Apo, MD as Consulting Physician (Ophthalmology) Crista Luria, MD as Consulting Physician (Dermatology) Vickey Huger, MD as Consulting Physician (Orthopedic Surgery) Valeta Harms, MD as Referring Physician (Orthopedic Surgery) Schulenklopper, Rozanna Boer, PT as Physical Therapist (Physical Therapy)  Allergies Allergies  Allergen Reactions  . Atorvastatin Other (See Comments)    Extreme pain  . Codeine Other (See Comments)    Headache  . Pravastatin Other (See Comments)    Exreme pain  . Statins Other (See Comments)    Extreme pain  . Amitriptyline Other (See Comments)    Unspecified  . Fish Oil Nausea Only  . Hydrocodone Other (See Comments)    Itching Other Reaction: Other reaction   . Linseed Oil Other (See Comments)    Other Reaction: Other reaction  . Nuvigil [Armodafinil] Other (See Comments)    Unspecified  . Sulfa Antibiotics Other (See Comments)  . Erythromycin Other (See Comments)    Reaction unspecified  . Erythromycin Base Rash  . Flax Seed [Bio-Flax] Rash    Linseed  . Hydrocodone-Acetaminophen Rash  . Morphine Nausea And Vomiting and Rash  . Nsaids Nausea Only and Other (See Comments)    Other Reaction: Other reaction  . Sertraline Rash  . Sinemet [Carbidopa W-Levodopa] Rash  . Sulfamethoxazole Rash  . Tolmetin Nausea Only    SURGICAL HISTORY She  has a past surgical history that includes Shoulder adhesion release; Spine surgery; Appendectomy (1960); Cesarean section ([place]); Abdominal hysterectomy (1992); Rotator cuff repair (Left, 2005); Tonsillectomy (1960); Joint replacement; Toe Surgery; Toe Surgery; Anterior cervical decompression/discectomy fusion 4 level (N/A, 06/14/2015); ORIF femur fracture (Left, 03/24/2017); and Knee surgery. FAMILY HISTORY Her family history includes Breast cancer in her mother; Cancer in her mother; Depression in her sister; Diabetes in her mother; Heart disease in her father; Hyperlipidemia in her father; Hypertension in her father; Leukemia in her sister; Lupus in her  sister; Migraines in her daughter; Neuropathy in her father; Osteoporosis in her mother; Rheum arthritis in her sister. SOCIAL HISTORY She  reports that she has never smoked. She has never used smokeless tobacco. She reports current alcohol use. She reports that she does not use drugs.  MEDICARE WELLNESS OBJECTIVES: Physical activity: Current Exercise Habits: The patient does not participate in regular exercise at present, Exercise limited by: orthopedic condition(s);neurologic condition(s) Cardiac risk factors: Cardiac Risk Factors include: advanced age (>49mn, >>46women);dyslipidemia;hypertension;sedentary lifestyle;diabetes mellitus Depression/mood screen:   [facility] 11/16/2019  Decreased Interest 0  Down, Depressed, Hopeless 1  PHQ - 2 Score 1  Altered sleeping 1  Tired, decreased energy 1  Change in appetite 0  Feeling bad or failure about yourself  0  Trouble concentrating 0  Moving slowly or fidgety/restless 0  Suicidal thoughts 0  PHQ-9 Score 3  Difficult doing work/chores Not difficult at all  Some recent data might be hidden    ADLs:  In your present state of health, do you have any difficulty performing the following activities:  11/16/2019 03/02/2019  Hearing? N N  Vision? N N  Difficulty concentrating or making decisions? N N  Walking or climbing stairs? Y N  Comment some stairs, chronic ortho problems, unstable gait -  Dressing or bathing? N N  Doing errands, shopping? N N  Some recent data might be hidden     Cognitive Testing  Alert? Yes  Normal Appearance?Yes  Oriented to person? Yes  Place? Yes   Time? Yes  Recall of three objects?  Yes  Can perform simple calculations? Yes  Displays appropriate judgment?Yes  Can read the correct time from a watch face?Yes  EOL planning: Does Patient Have a Medical Advance Directive?: Yes Type of Advance Directive: Healthcare Power of Attorney, Living will Does patient want to make changes to medical  advance directive?: No - Patient declined Copy of [place] in Chart?: No - copy requested   Objective:   Today's Vitals   11/16/19 1451  BP: 110/70  Pulse: (!) 106  Temp: (!) 96.9 F (36.1 C)  SpO2: 95%  Weight: 163 lb (73.9 kg)  Height: 5' 4"  (1.626 m)   Body mass index is 27.98 kg/m.  General appearance: alert, no distress, WD/WN,  female HEENT: normocephalic, sclerae anicteric, right ear with pain with pulling on tragus, + erythema, swelling external canal with yellow/white discharge, TMs pearly without bulging or erythema, nares patent, no discharge or erythema, pharynx normal Oral cavity: MMM, no lesions Neck: supple, no lymphadenopathy, no thyromegaly, no masses Heart: RRR, normal S1, S2, no murmurs Lungs: CTA bilaterally, no wheezes, rhonchi, or rales Abdomen: +bs, soft, non tender, non distended, no masses, no hepatomegaly, no splenomegaly Musculoskeletal: nontender, no swelling, no obvious deformity Extremities: no edema, no cyanosis, no clubbing Pulses: 2+ symmetric, upper and lower extremities, normal cap refill Neurological: alert, oriented x 3, CN2-12 intact, strength normal upper extremities and lower extremities, sensation normal throughout, DTRs 2+ throughout, no cerebellar signs, gait normal Psychiatric: normal affect, behavior normal, pleasant   Medicare Attestation I have personally reviewed: The patient's medical and social history Their use of alcohol, tobacco or illicit drugs Their current medications and supplements The patient's functional ability including ADLs,fall risks, home safety risks, cognitive, and hearing and visual impairment Diet and physical activities Evidence for depression or mood disorders  The patient's weight, height, BMI, and visual acuity have been recorded in the chart.  I have made referrals, counseling, and provided education to the patient based on review of the above and I have provided the patient with a  written personalized care plan for preventive services.     Izora Ribas, NP   11/16/2019

## 2019-11-16 NOTE — Patient Instructions (Addendum)
  Crystal [name] , Thank you for taking time to come for your Medicare Wellness Visit. I appreciate your ongoing commitment to your health goals. Please review the following plan we discussed and let me know if I can assist you in the future.   These are the goals we discussed: Goals    . Exercise 150 min/wk Moderate Activity    . HEMOGLOBIN A1C < 5.7    . Weight (lb) < 170 lb (77.1 kg)       This is a list of the screening recommended for you and due dates:  Health Maintenance  Topic Date Due  . COVID-19 Vaccine (1) Never done  . Eye exam for diabetics  12/10/2017  . Hemoglobin A1C  01/18/2020  . Flu Shot  01/29/2020  . Complete foot exam   03/01/2020  . Urine Protein Check  03/02/2020  . Mammogram  05/11/2021  . Tetanus Vaccine  01/02/2025  . Colon Cancer Screening  10/14/2027  . DEXA scan (bone density measurement)  Completed  .  Hepatitis C: One time screening is recommended by Center for Disease Control  (CDC) for  adults born from [birth year] through 1967.   Completed  . Pneumonia vaccines  Completed    Schedule bone density exam with next mammogram

## 2019-11-17 LAB — COMPLETE METABOLIC PANEL WITH GFR
AG Ratio: 1.5 (calc) (ref 1.0–2.5)
ALT: 11 U/L (ref 6–29)
AST: 15 U/L (ref 10–35)
Albumin: 4.1 g/dL (ref 3.6–5.1)
Alkaline phosphatase (APISO): 40 U/L (ref 37–153)
BUN: 17 mg/dL (ref 7–25)
CO2: 36 mmol/L — ABNORMAL HIGH (ref 20–32)
Calcium: 10.3 mg/dL (ref 8.6–10.4)
Chloride: 103 mmol/L (ref 98–110)
Creat: 0.79 mg/dL (ref 0.60–0.93)
GFR, Est African American: 87 mL/min/{1.73_m2} (ref >=60)
GFR, Est Non African American: 75 mL/min/{1.73_m2} (ref >=60)
Globulin: 2.7 g/dL (calc) (ref 1.9–3.7)
Glucose, Bld: 110 mg/dL — ABNORMAL HIGH (ref 65–99)
Potassium: 4.9 mmol/L (ref 3.5–5.3)
Sodium: 144 mmol/L (ref 135–146)
Total Bilirubin: 0.4 mg/dL (ref 0.2–1.2)
Total Protein: 6.8 g/dL (ref 6.1–8.1)

## 2019-11-17 LAB — HEMOGLOBIN A1C
Hgb A1c MFr Bld: 5.9 % of total Hgb — ABNORMAL HIGH (ref <5.7)
Mean Plasma Glucose: 123 (calc)
eAG (mmol/L): 6.8 (calc)

## 2019-11-17 LAB — CBC WITH DIFFERENTIAL/PLATELET
Absolute Monocytes: 701 cells/uL (ref 200–950)
Basophils Absolute: 68 cells/uL (ref 0–200)
Basophils Relative: 1.2 %
Eosinophils Absolute: 80 cells/uL (ref 15–500)
Eosinophils Relative: 1.4 %
HCT: 45 % (ref 35.0–45.0)
Hemoglobin: 15 g/dL (ref 11.7–15.5)
Lymphs Abs: 1739 cells/uL (ref 850–3900)
MCH: 30.9 pg (ref 27.0–33.0)
MCHC: 33.3 g/dL (ref 32.0–36.0)
MCV: 92.6 fL (ref 80.0–100.0)
MPV: 8.9 fL (ref 7.5–12.5)
Monocytes Relative: 12.3 %
Neutro Abs: 3112 cells/uL (ref 1500–7800)
Neutrophils Relative %: 54.6 %
Platelets: 282 10*3/uL (ref 140–400)
RBC: 4.86 10*6/uL (ref 3.80–5.10)
RDW: 12.6 % (ref 11.0–15.0)
Total Lymphocyte: 30.5 %
WBC: 5.7 10*3/uL (ref 3.8–10.8)

## 2019-11-17 LAB — LIPID PANEL
Cholesterol: 183 mg/dL (ref <200)
HDL: 44 mg/dL — ABNORMAL LOW (ref >=50)
LDL Cholesterol (Calc): 101 mg/dL (calc) — ABNORMAL HIGH
Non-HDL Cholesterol (Calc): 139 mg/dL (calc) — ABNORMAL HIGH (ref <130)
Total CHOL/HDL Ratio: 4.2 (calc) (ref <5.0)
Triglycerides: 267 mg/dL — ABNORMAL HIGH (ref <150)

## 2019-11-17 LAB — MAGNESIUM: Magnesium: 2 mg/dL (ref 1.5–2.5)

## 2019-11-17 LAB — TSH: TSH: 0.72 mIU/L (ref 0.40–4.50)

## 2019-11-21 ENCOUNTER — Telehealth: Payer: Self-pay | Admitting: *Deleted

## 2019-11-21 NOTE — Telephone Encounter (Signed)
Pt states she has taken a fall and is having pain and swelling in the foot and ankle, would like an appt.

## 2019-11-22 DIAGNOSIS — Z96611 Presence of right artificial shoulder joint: Secondary | ICD-10-CM | POA: Diagnosis not present

## 2019-11-22 DIAGNOSIS — Z471 Aftercare following joint replacement surgery: Secondary | ICD-10-CM | POA: Diagnosis not present

## 2019-11-24 DIAGNOSIS — M961 Postlaminectomy syndrome, not elsewhere classified: Secondary | ICD-10-CM | POA: Diagnosis not present

## 2019-11-24 DIAGNOSIS — M5416 Radiculopathy, lumbar region: Secondary | ICD-10-CM | POA: Diagnosis not present

## 2019-11-25 ENCOUNTER — Ambulatory Visit (INDEPENDENT_AMBULATORY_CARE_PROVIDER_SITE_OTHER): Payer: Medicare PPO

## 2019-11-25 ENCOUNTER — Ambulatory Visit: Payer: Medicare PPO | Admitting: Podiatry

## 2019-11-25 ENCOUNTER — Encounter: Payer: Self-pay | Admitting: Podiatry

## 2019-11-25 ENCOUNTER — Other Ambulatory Visit: Payer: Self-pay

## 2019-11-25 ENCOUNTER — Other Ambulatory Visit: Payer: Self-pay | Admitting: Podiatry

## 2019-11-25 DIAGNOSIS — R2681 Unsteadiness on feet: Secondary | ICD-10-CM

## 2019-11-25 DIAGNOSIS — M778 Other enthesopathies, not elsewhere classified: Secondary | ICD-10-CM

## 2019-11-25 DIAGNOSIS — G629 Polyneuropathy, unspecified: Secondary | ICD-10-CM | POA: Diagnosis not present

## 2019-11-25 DIAGNOSIS — R52 Pain, unspecified: Secondary | ICD-10-CM

## 2019-11-30 NOTE — Progress Notes (Signed)
Subjective:   Patient ID: Crystal [name], female   DOB: 69 y.o.   MRN: [id number]   HPI Patient presents stating that she has pain on top of both feet and also she is just concerned about instability.  States the pain on the top comes back gradually over time   ROS      Objective:  Physical Exam  Neurovascular status intact with patient shown to have inflammation pain to the dorsal of the extensor complex bilateral with inflammation fluid buildup but no loss of function.  Patient has good range of motion subtalar midtarsal joint     Assessment:  Chronic extensor tendinitis which is improved but present     Plan:  H&P and reviewed condition and at this point sterile prep done injected the tendon complex 3 mg Kenalog 5 mg Xylocaine advised on anti-inflammatories topicals and reappoint as needed  X-rays dated today indicate that the patient does have some arthritic issues associated with the tendon complex but no indications of advancement of deformity from previous views

## 2019-12-07 ENCOUNTER — Ambulatory Visit: Payer: Medicare PPO | Admitting: Podiatry

## 2019-12-09 ENCOUNTER — Other Ambulatory Visit: Payer: Medicare PPO | Admitting: Orthotics

## 2019-12-10 ENCOUNTER — Other Ambulatory Visit: Payer: Self-pay | Admitting: Internal Medicine

## 2019-12-19 ENCOUNTER — Encounter (HOSPITAL_COMMUNITY): Payer: Self-pay | Admitting: Emergency Medicine

## 2019-12-19 ENCOUNTER — Emergency Department (HOSPITAL_COMMUNITY)
Admission: EM | Admit: 2019-12-19 | Discharge: 2019-12-19 | Disposition: A | Discharge status: Home / Self Care | Payer: Medicare PPO | Attending: Emergency Medicine | Admitting: Emergency Medicine

## 2019-12-19 ENCOUNTER — Emergency Department (HOSPITAL_COMMUNITY): Payer: Medicare PPO

## 2019-12-19 DIAGNOSIS — E1122 Type 2 diabetes mellitus with diabetic chronic kidney disease: Secondary | ICD-10-CM | POA: Diagnosis not present

## 2019-12-19 DIAGNOSIS — R519 Headache, unspecified: Secondary | ICD-10-CM | POA: Diagnosis not present

## 2019-12-19 DIAGNOSIS — M79641 Pain in right hand: Secondary | ICD-10-CM

## 2019-12-19 DIAGNOSIS — S4991XA Unspecified injury of right shoulder and upper arm, initial encounter: Secondary | ICD-10-CM | POA: Diagnosis not present

## 2019-12-19 DIAGNOSIS — S199XXA Unspecified injury of neck, initial encounter: Secondary | ICD-10-CM | POA: Diagnosis not present

## 2019-12-19 DIAGNOSIS — S8991XA Unspecified injury of right lower leg, initial encounter: Secondary | ICD-10-CM | POA: Diagnosis not present

## 2019-12-19 DIAGNOSIS — S299XXA Unspecified injury of thorax, initial encounter: Secondary | ICD-10-CM | POA: Diagnosis not present

## 2019-12-19 DIAGNOSIS — M542 Cervicalgia: Secondary | ICD-10-CM | POA: Diagnosis not present

## 2019-12-19 DIAGNOSIS — S79911A Unspecified injury of right hip, initial encounter: Secondary | ICD-10-CM | POA: Diagnosis not present

## 2019-12-19 DIAGNOSIS — S0083XA Contusion of other part of head, initial encounter: Secondary | ICD-10-CM | POA: Diagnosis not present

## 2019-12-19 DIAGNOSIS — S0990XA Unspecified injury of head, initial encounter: Secondary | ICD-10-CM

## 2019-12-19 DIAGNOSIS — S3992XA Unspecified injury of lower back, initial encounter: Secondary | ICD-10-CM | POA: Diagnosis not present

## 2019-12-19 DIAGNOSIS — N182 Chronic kidney disease, stage 2 (mild): Secondary | ICD-10-CM | POA: Diagnosis not present

## 2019-12-19 DIAGNOSIS — W109XXA Fall (on) (from) unspecified stairs and steps, initial encounter: Secondary | ICD-10-CM | POA: Diagnosis not present

## 2019-12-19 DIAGNOSIS — E039 Hypothyroidism, unspecified: Secondary | ICD-10-CM | POA: Insufficient documentation

## 2019-12-19 DIAGNOSIS — S79912A Unspecified injury of left hip, initial encounter: Secondary | ICD-10-CM | POA: Diagnosis not present

## 2019-12-19 DIAGNOSIS — S60211A Contusion of right wrist, initial encounter: Secondary | ICD-10-CM | POA: Insufficient documentation

## 2019-12-19 DIAGNOSIS — R296 Repeated falls: Secondary | ICD-10-CM | POA: Diagnosis not present

## 2019-12-19 DIAGNOSIS — R269 Unspecified abnormalities of gait and mobility: Secondary | ICD-10-CM | POA: Diagnosis not present

## 2019-12-19 DIAGNOSIS — R600 Localized edema: Secondary | ICD-10-CM | POA: Diagnosis not present

## 2019-12-19 DIAGNOSIS — Y999 Unspecified external cause status: Secondary | ICD-10-CM | POA: Diagnosis not present

## 2019-12-19 DIAGNOSIS — I129 Hypertensive chronic kidney disease with stage 1 through stage 4 chronic kidney disease, or unspecified chronic kidney disease: Secondary | ICD-10-CM | POA: Insufficient documentation

## 2019-12-19 DIAGNOSIS — Z96611 Presence of right artificial shoulder joint: Secondary | ICD-10-CM | POA: Diagnosis not present

## 2019-12-19 DIAGNOSIS — Z79899 Other long term (current) drug therapy: Secondary | ICD-10-CM | POA: Insufficient documentation

## 2019-12-19 DIAGNOSIS — Y929 Unspecified place or not applicable: Secondary | ICD-10-CM | POA: Insufficient documentation

## 2019-12-19 DIAGNOSIS — S0993XA Unspecified injury of face, initial encounter: Secondary | ICD-10-CM | POA: Diagnosis not present

## 2019-12-19 DIAGNOSIS — S59911A Unspecified injury of right forearm, initial encounter: Secondary | ICD-10-CM | POA: Diagnosis not present

## 2019-12-19 DIAGNOSIS — W19XXXA Unspecified fall, initial encounter: Secondary | ICD-10-CM

## 2019-12-19 DIAGNOSIS — Y939 Activity, unspecified: Secondary | ICD-10-CM | POA: Diagnosis not present

## 2019-12-19 MED ORDER — GABAPENTIN 100 MG PO CAPS
300.0000 mg | ORAL_CAPSULE | Freq: Once | ORAL | Status: AC
  Administered 2019-12-19: 300 mg via ORAL
  Filled 2019-12-19: qty 3

## 2019-12-19 MED ORDER — TIZANIDINE HCL 4 MG PO TABS
2.0000 mg | ORAL_TABLET | Freq: Once | ORAL | Status: AC
  Administered 2019-12-19: 2 mg via ORAL
  Filled 2019-12-19: qty 1

## 2019-12-19 MED ORDER — ROPINIROLE HCL 1 MG PO TABS
2.0000 mg | ORAL_TABLET | Freq: Once | ORAL | Status: DC
  Filled 2019-12-19: qty 2

## 2019-12-19 MED ORDER — FENTANYL CITRATE (PF) 100 MCG/2ML IJ SOLN
50.0000 ug | Freq: Once | INTRAMUSCULAR | Status: AC
  Administered 2019-12-19: 50 ug via INTRAMUSCULAR
  Filled 2019-12-19: qty 2

## 2019-12-19 NOTE — ED Provider Notes (Signed)
[facility] EMERGENCY DEPARTMENT Provider Note   CSN: [id number] Arrival date & time: 12/19/19  0946   History Chief Complaint  Patient presents with  . Fall  . Back Pain  . Shoulder Pain  . Hand Pain  . Head Injury   Crystal [name] is a 69 y.o. female with past medical history significant for gait disorder, depression who presents for evaluation of fall.  Patient states she was bending down off her stairs to spray ants when she reached forward and fell.  She admits to hitting her head as well as her right upper and lower extremity.  She denies any preceding headache mellitus, dizziness, chest pain or shortness of breath.  Patient states she has frequent falls due to her gait disorder.  She has not been using her walker as she is supposed to.  Patient with ecchymosis to inferior portion of her right eye.  She denies any headache, lightheadedness, dizziness, vision changes, eye pain, chest pain, shortness of breath, abdominal pain, diarrhea, dysuria.  Admits to right shoulder pain, right hand pain, right tib-fib pain.  Has not take anything for pain.  Denies addition aggravating relieving factors.  History obtained from patient and past records. No interpretor was used.  HPI     Past Medical History:  Diagnosis Date  . Arthritis   . ASCVD (arteriosclerotic cardiovascular disease)   . Closed nondisplaced subtrochanteric fracture of left femur ([place]) 09/03/2017   September 2018.  Status post surgery by Dr. Marcelino Scot  . Depression   . Fibromyalgia   . Gait disorder 10/26/2012  . GERD (gastroesophageal reflux disease)   . GI bleed   . Hyperlipidemia   . Hypertension   . Hypothyroid   . Periprosthetic supracondylar fracture of femur, left  03/23/2017  . Restless leg syndrome   . Rhinitis 06/25/2010  . Sleep apnea   . TIA (transient ischemic attack)    3        . Vitamin D deficiency     Patient Active Problem List   Diagnosis Date Noted  . CKD stage 2  due to type 2 diabetes mellitus ([place]) 11/16/2019  . COVID-19 virus infection 10/19/2019  . History of recurrent TIAs 11/15/2018  . Rupture of popliteal cyst of right knee region (complex cyst) 11/03/2018  . Diet-controlled type 2 diabetes mellitus ([place]) 07/04/2018  . History of adenomatous polyp of colon 10/18/2017  . Bronchiectasis without complication ([place]) 13/24/4010  . Diverticulosis 10/12/2017  . Internal hemorrhoids 10/12/2017  . Polypharmacy 04/09/2017  . Arthritis of carpometacarpal (CMC) joints of both thumbs 02/12/2017  . Cervical disc disease 01/23/2017  . Constipation 01/23/2017  . Crohn's ileitis ([place]) 01/23/2017  . S/P revision of total knee, left 12/18/2016  . B12 deficiency anemia 06/16/2016  . Sleep talking 03/26/2016  . IBS (irritable colon syndrome) 08/09/2015  . Spinal stenosis in cervical region 06/14/2015  . Other specified arthropathy involving shoulder region 11/23/2014  . Obesity (BMI 30.0-34.9) 10/31/2014  . Medication management 10/25/2013  . Rotator cuff arthropathy 09/15/2013  . Hyperlipidemia associated with type 2 diabetes mellitus ([place])   . Hypothyroid   . Depression, major, recurrent, in partial remission ([place])   . Vitamin D deficiency   . ASCVD   . Gait disorder 10/26/2012  . Knee pain 09/07/2012  . Rotator cuff tear 08/23/2012  . Bilateral bunions 11/19/2011  . Rhinitis 06/25/2010  . Obstructive sleep apnea 06/21/2010  . RESTLESS LEG SYNDROME 06/21/2010  . Essential hypertension 06/21/2010  .  GERD 06/21/2010  . Crohn's Enterocolitis / Regional enteritis 06/21/2010  . Fibromyalgia 06/21/2010    Past Surgical History:  Procedure Laterality Date  . ABDOMINAL HYSTERECTOMY  1992  . ANTERIOR CERVICAL DECOMPRESSION/DISCECTOMY FUSION 4 LEVELS N/A 06/14/2015   Procedure: Cervical three-four Cervical four-five Cervical five- six Cervical six- seven  Anterior cervical decompression/diskectomy/fusion;  Surgeon: Erline Levine, MD;  Location: [facility];  Service: Neurosurgery;  Laterality: N/A;  C3-4 C4-5 C5-6 C6-7 Anterior cervical decompression/diskectomy/fusion  . APPENDECTOMY  1960  . [place]  . JOINT REPLACEMENT     2   LEFT  1 RIGHT   . KNEE SURGERY    . ORIF FEMUR FRACTURE Left 03/24/2017   Procedure: OPEN REDUCTION INTERNAL FIXATION (ORIF) FEMUR FRACTURE;  Surgeon: Altamese [place], MD;  Location: [facility];  Service: Orthopedics;  Laterality: Left;  . ROTATOR CUFF REPAIR Left 2005   2 LEFT  1  RIGHT  . SHOULDER ADHESION RELEASE    . SPINE SURGERY    . TOE SURGERY     LEFT       . TOE SURGERY     RIGHT  BIG TOE  . TONSILLECTOMY  1960     OB History   No obstetric history on file.     Family History  Problem Relation Age of Onset  . Leukemia Sister   . Lupus Sister   . Depression Sister   . Rheum arthritis Sister   . Heart disease Father   . Hypertension Father   . Hyperlipidemia Father   . Neuropathy Father   . Cancer Mother   . Diabetes Mother   . Osteoporosis Mother   . Breast cancer Mother   . Migraines Daughter     Social History   Tobacco Use  . Smoking status: Never Smoker  . Smokeless tobacco: Never Used  Vaping Use  . Vaping Use: Never used  Substance Use Topics  . Alcohol use: Yes    Alcohol/week: 0.0 standard drinks    Comment: rarely  . Drug use: No    Home Medications Prior to Admission medications   Medication Sig Start Date End Date Taking? Authorizing Provider  Adalimumab 40 MG/0.4ML PNKT Inject into the skin. 11/04/18   [provider]  aspirin EC 81 MG tablet Take 81 mg by mouth daily.    [provider]  bisoprolol (ZEBETA) 10 MG tablet Take 1 tablet Daily for BP 10/24/19   Unk Pinto, MD  buPROPion (WELLBUTRIN XL) 300 MG 24 hr tablet Take 1 tablet Daily for Mood, Focus & Concentration 04/12/19   Unk Pinto, MD  Cyanocobalamin (B-12 SL) Place under the tongue daily.    [provider]  diclofenac sodium (VOLTAREN) 1 % GEL  Apply 2 g topically 2 (two) times daily as needed.  12/27/16   [provider]  DULoxetine (CYMBALTA) 60 MG capsule Take 1 capsule Daily for Chronic Pain 05/15/19   Unk Pinto, MD  fenofibrate micronized (LOFIBRA) 134 MG capsule TAKE 1 CAPSULE BY MOUTH EVERY DAY BEFORE BREAKFAST 01/21/19   [name], NP  fluticasone (FLONASE) 50 MCG/ACT nasal spray USE TWO SPRAYS IN EACH NOSTRIL DAILY AS NEEDED 12/14/18   Garnet Sierras, NP  gabapentin (NEURONTIN) 300 MG capsule Take 1 capsule 3 x/day for Chronic Pain 01/17/19   Unk Pinto, MD  levothyroxine (SYNTHROID) 100 MCG tablet TAKE 1 TABLET BY MOUTH ON TUES, WED, THURS, SAT AND SUN. TAKE 1& 1/2 TABLET ON MON AND  FRI Patient taking differently: daily.  07/15/19   [name], NP  lidocaine (XYLOCAINE) 2 % jelly apply small amount topically to affected area 2-3 times a day, knees, legs. 07/12/19   Garnet Sierras, NP  meclizine (ANTIVERT) 25 MG tablet TAKE 1 TABLET (25 MG TOTAL) BY MOUTH 3 (THREE) TIMES DAILY AS NEEDED FOR DIZZINESS. 06/13/18   Unk Pinto, MD  metroNIDAZOLE (FLAGYL) 500 MG tablet Take 500 mg by mouth See admin instructions. Takes 1 tab three days a week    [provider]  NEOMYCIN-POLYMYXIN-HYDROCORTISONE (CORTISPORIN) 1 % SOLN OTIC solution Place 3 drops into both ears 4 (four) times daily. 10/12/19   Garnet Sierras, NP  oxybutynin (DITROPAN) 5 MG tablet TAKE 1 TABLET 2 X /DAY FOR BLADDER CONTROL 07/15/19   [name], NP  pantoprazole (PROTONIX) 40 MG tablet Take 1 tablet Daily for Indigestion & Acid Reflux 10/31/19   Unk Pinto, MD  Phenylephrine-guaiFENesin (MUCUS RELIEF D PO) Take by mouth daily.    [provider]  polyethylene glycol (MIRALAX / GLYCOLAX) packet Take 17 g by mouth daily as needed for mild constipation. 06/26/15   Domenic Polite, MD  Respiratory Therapy Supplies (FLUTTER) DEVI Use flutter valve 3 times a day 11/24/18   Martyn Ehrich, NP  rOPINIRole (REQUIP)  2 MG tablet Take 1 tablet 4 x /day for Restless Legs 12/04/18   Unk Pinto, MD  tiZANidine (ZANAFLEX) 2 MG tablet TAKE 1/2 TO 1 TABLET 3 TIMES A DAY AS NEEDED FOR MUSCLE SPASMS 12/10/19   Unk Pinto, MD  TRAMADOL HCL ER PO Take by mouth as needed.     [provider]    Allergies    Atorvastatin, Codeine, Pravastatin, Statins, Amitriptyline, Fish oil, Hydrocodone, Linseed oil, Nuvigil [armodafinil], Sulfa antibiotics, Erythromycin, Erythromycin base, Flax seed [bio-flax], Hydrocodone-acetaminophen, Morphine, Nsaids, Sertraline, Sinemet [carbidopa w-levodopa], Sulfamethoxazole, and Tolmetin  Review of Systems   Review of Systems  Constitutional: Negative.   HENT: Negative.   Eyes: Negative.   Respiratory: Negative.   Cardiovascular: Negative.   Gastrointestinal: Negative.   Genitourinary: Negative.   Musculoskeletal: Positive for back pain, gait problem and neck pain.  Skin: Positive for wound.  Neurological: Positive for headaches. Negative for dizziness, tremors, seizures, syncope, facial asymmetry, speech difficulty, weakness, light-headedness and numbness.  All other systems reviewed and are negative.  Physical Exam Updated Vital Signs BP 116/71 (BP Location: Right Arm)   Pulse 75   Temp 97.9 F (36.6 C) (Oral)   Resp 18   SpO2 100%   Physical Exam Vitals and nursing note reviewed.  Constitutional:      General: She is not in acute distress.    Appearance: She is well-developed. She is not ill-appearing, toxic-appearing or diaphoretic.  HENT:     Head: Normocephalic. No raccoon eyes or Battle's sign.     Jaw: There is normal jaw occlusion.     Comments: Ecchymosis to inferior periorbital region, no crepitus or step-offs. Right sided facial droop including forehead and right lateral aspect to lip. Chronic per patient    Right Ear: No hemotympanum.     Left Ear: No hemotympanum.     Nose: Nose normal.     Comments: No nasal crepitus, no septal hematoma     Mouth/Throat:     Comments: No loose dentition. Eyes:     Extraocular Movements: Extraocular movements intact.     Conjunctiva/sclera: Conjunctivae normal.     Pupils: Pupils are equal, round, and reactive to light.  Visual Fields: Right eye visual fields normal and left eye visual fields normal.     Comments: No erythema, injection, traumatic hyphema  Neck:     Trachea: Trachea and phonation normal.      Comments: Mild tenderness to right upper cervical muscles.  No crepitus or step-offs patient declined c-collar Cardiovascular:     Rate and Rhythm: Normal rate.     Pulses: Normal pulses.          Radial pulses are 2+ on the right side and 2+ on the left side.       Dorsalis pedis pulses are 2+ on the right side and 2+ on the left side.       Posterior tibial pulses are 2+ on the right side and 2+ on the left side.     Heart sounds: Normal heart sounds.  Pulmonary:     Effort: Pulmonary effort is normal. No respiratory distress.     Breath sounds: Normal breath sounds and air entry.     Comments: Speaks in full sentences without difficulty Chest:     Comments: No crepitus, step-offs.  Equal rise and fall of chest Abdominal:     General: Bowel sounds are normal. There is no distension.     Palpations: Abdomen is soft. There is no mass.     Tenderness: There is no abdominal tenderness. There is no right CVA tenderness, left CVA tenderness or guarding.     Comments: Abrasion to central abdomen nontender to palpation.  No rebound or guarding  Musculoskeletal:        General: Normal range of motion.     Cervical back: Normal range of motion. Signs of trauma present. No edema, erythema or rigidity. Pain with movement and muscular tenderness present. Normal range of motion.     Comments: Mild midline.  No crepitus or step-offs.  No tenderness to thoracic spine.  Pelvis stable, nontender palpation.  No tenderness of bilateral femurs.  Mild tenderness to right midshaft tib-fib.  No bony  tenderness to ankles.  Able to plantarflex and dorsiflex without difficulty.  Able to wiggle toes at difficulty.  Straight leg raises without difficulty.  No shortening or rotation of legs tenderness to right anterior shoulder.  Negative Hawkins, empty can.  No bony tenderness to midshaft, distal humerus.  She does have tenderness to her mid radius and ulna as well as dorsal aspect of her right hand.  Able to pronate, supinate, flex and extend bilateral wrist without difficulty.  No bony tenderness to scaphoid  Feet:     Right foot:     Skin integrity: Skin integrity normal.     Left foot:     Skin integrity: Skin integrity normal.  Skin:    General: Skin is warm and dry.     Capillary Refill: Capillary refill takes less than 2 seconds.     Comments: Ecchymosis to midshaft radius ulna as well as dorsal aspect of right hand.  No edema, erythema or warmth.  No lacerations to suture  Neurological:     Mental Status: She is alert.     Sensory: Sensation is intact.     Motor: Motor function is intact.     Coordination: Coordination is intact.     Gait: Gait is intact.     Comments: Right-sided facial droop including forehead, lower lip.  Chronic from prior Bell's palsy.  Intact sensation. 5/5 equal strength bilaterally.    ED Results / Procedures / Treatments   Labs (  all labs ordered are listed, but only abnormal results are displayed) Labs Reviewed - No data to display  EKG None  Radiology DG Chest 2 View  Result Date: 12/19/2019 CLINICAL DATA:  Fall down steps landing on bricks. EXAM: CHEST - 2 VIEW COMPARISON:  10/11/2019 FINDINGS: The cardiomediastinal contours are normal. The lungs are clear. Pulmonary vasculature is normal. No consolidation, pleural effusion, or pneumothorax. No acute osseous abnormalities are seen. Reverse right shoulder arthroplasty. Hardware in the lower cervical spine is partially included. Chronic left shoulder arthropathy. IMPRESSION: No acute chest findings.  Electronically Signed   By: Keith Rake M.D.   On: 12/19/2019 22:03   DG Lumbar Spine Complete  Result Date: 12/19/2019 CLINICAL DATA:  Fall down steps landing on bricks. EXAM: LUMBAR SPINE - COMPLETE 4+ VIEW COMPARISON:  Lumbar radiograph 11/19/2017. FINDINGS: Possible diminutive ribs at L1. posterior fusion hardware at L4-L5. Unchanged anterolisthesis of L5 on S1 with interbody spacer in place. No evidence of acute fracture. There is diffuse degenerative disc disease. Posterior elements are intact. Sacroiliac joints are congruent. IMPRESSION: 1. No evidence of acute fracture. 2. Stable degenerative and postsurgical change. Electronically Signed   By: Keith Rake M.D.   On: 12/19/2019 22:01   DG Shoulder Right  Result Date: 12/19/2019 CLINICAL DATA:  Trip and fall down steps landing on bricks. EXAM: RIGHT SHOULDER - 2+ VIEW COMPARISON:  None. FINDINGS: Reverse right shoulder arthroplasty. No periprosthetic fracture or lucency. There is mild acromioclavicular degenerative change. Included scapula is intact. IMPRESSION: Reverse right shoulder arthroplasty without complication. No acute fracture. Electronically Signed   By: Keith Rake M.D.   On: 12/19/2019 22:08   DG Forearm Right  Result Date: 12/19/2019 CLINICAL DATA:  Fall down steps landing on bricks. EXAM: RIGHT FOREARM - 2 VIEW COMPARISON:  None. FINDINGS: Cortical irregularity involving the radial neck felt to represent degenerative osteophytes, nondisplaced fracture could have a similar appearance. No other radial fracture. The ulna is intact. Wrist alignment is maintained. Chronic density adjacent to the lateral humeral epicondyle may be remote prior injury or enthesopathic. IMPRESSION: Cortical irregularity involving the radial neck felt to represent degenerative osteophytes, nondisplaced fracture could have a similar appearance. Recommend correlation for focal tenderness. If there is clinical concern for fracture of the elbow,  recommend dedicated elbow radiographs. Electronically Signed   By: Keith Rake M.D.   On: 12/19/2019 22:05   DG Tibia/Fibula Right  Result Date: 12/19/2019 CLINICAL DATA:  Trip and fall down steps landing on bricks. EXAM: RIGHT TIBIA AND FIBULA - 2 VIEW COMPARISON:  None. FINDINGS: Right knee arthroplasty intact were visualized. No periprosthetic fracture. Cortical margins of the tibia and fibular intact. No evidence of acute fracture. There is tibial talar degenerative change. No focal soft tissue abnormality. IMPRESSION: No acute fracture of the right lower leg. Electronically Signed   By: Keith Rake M.D.   On: 12/19/2019 22:09   CT Head Wo Contrast  Result Date: 12/19/2019 CLINICAL DATA:  Headache and fell down steps EXAM: CT HEAD WITHOUT CONTRAST TECHNIQUE: Contiguous axial images were obtained from the base of the skull through the vertex without intravenous contrast. COMPARISON:  CT brain 09/23/2019, 09/09/2017 FINDINGS: Brain: No acute territorial infarction, hemorrhage or new intracranial mass. Mild atrophy. Stable ventricle size. Vascular: No hyperdense vessels. Scattered carotid vascular calcification Skull: Normal. Negative for fracture or focal lesion. Sinuses/Orbits: No acute finding. Other: Small forehead scalp hematoma. IMPRESSION: 1. No CT evidence for acute intracranial abnormality. 2. Mild atrophy Electronically Signed  By: Donavan Foil M.D.   On: 12/19/2019 21:27   CT Cervical Spine Wo Contrast  Result Date: 12/19/2019 CLINICAL DATA:  [place] down steps EXAM: CT CERVICAL SPINE WITHOUT CONTRAST TECHNIQUE: Multidetector CT imaging of the cervical spine was performed without intravenous contrast. Multiplanar CT image reconstructions were also generated. COMPARISON:  CT 06/24/2015 FINDINGS: Alignment: Straightening of the cervical spine. Facet alignment is within normal limits. Skull base and vertebrae: No acute fracture. No primary bone lesion or focal pathologic process. Soft  tissues and spinal canal: No prevertebral fluid or swelling. No visible canal hematoma. Disc levels: Post fusion changes C3 through C7 with solid bone fusion present. There is also fusion at C2-C3. There is effacement of the disc space at C7-T1. Facet degenerative changes at multiple levels. Upper chest: Negative. Other: None IMPRESSION: Straightening of the cervical spine with postsurgical changes C3 through C7. No acute osseous abnormality. Electronically Signed   By: Donavan Foil M.D.   On: 12/19/2019 21:38   DG Hand Complete Right  Result Date: 12/19/2019 CLINICAL DATA:  Fall down steps landing on bricks. EXAM: RIGHT HAND - COMPLETE 3+ VIEW COMPARISON:  None. FINDINGS: There is no evidence of fracture or dislocation. Multifocal osteoarthritis, prominently involving the thumb carpal metacarpal joint as well as the digits. Questionable erosive osteoarthritis of the second and third distal interphalangeal joints. Equivocal dorsal soft tissue edema. IMPRESSION: 1. No acute fracture or subluxation. 2. Multifocal osteoarthritis, prominently involving the thumb carpometacarpal joint and the digits. Possible erosive osteoarthritis involving the second and third DIP. Electronically Signed   By: Keith Rake M.D.   On: 12/19/2019 22:06   DG HIP UNILAT WITH PELVIS 2-3 VIEWS LEFT  Result Date: 12/19/2019 CLINICAL DATA:  Trip and fall down steps landing on bricks. EXAM: DG HIP (WITH OR WITHOUT PELVIS) 2-3V LEFT COMPARISON:  Concurrent pelvis and right hip exam. FINDINGS: The cortical margins of the bony pelvis and left hip are intact. No fracture. Pubic symphysis and sacroiliac joints are congruent. Pubic rami are intact. Both femoral heads are well-seated in the respective acetabula. Moderate right and mild left hip osteoarthritis. Lateral plate and multi screw fixation of the femoral shaft, intact were visualized. IMPRESSION: No fracture of the pelvis or left hip. Electronically Signed   By: Keith Rake  M.D.   On: 12/19/2019 22:10   DG HIP UNILAT WITH PELVIS 2-3 VIEWS RIGHT  Result Date: 12/19/2019 CLINICAL DATA:  Trip and fall down steps landing on bricks. EXAM: DG HIP (WITH OR WITHOUT PELVIS) 2-3V RIGHT COMPARISON:  None. FINDINGS: The cortical margins of the bony pelvis and right hip are intact. No fracture. Pubic symphysis and sacroiliac joints are congruent. Pubic rami are intact. Both femoral heads are well-seated in the respective acetabula. Moderate right and mild left hip osteoarthritis. Left femoral hardware is partially included in the field of view. IMPRESSION: No fracture of the pelvis or right hip. Electronically Signed   By: Keith Rake M.D.   On: 12/19/2019 22:07   CT Maxillofacial Wo Contrast  Result Date: 12/19/2019 CLINICAL DATA:  Fall down steps EXAM: CT MAXILLOFACIAL WITHOUT CONTRAST TECHNIQUE: Multidetector CT imaging of the maxillofacial structures was performed. Multiplanar CT image reconstructions were also generated. COMPARISON:  None. FINDINGS: Osseous: Mild motion degradation. Mandibular heads are normally position. No mandibular fracture. Mastoid air cells are clear. Pterygoid plates and zygomatic arches are intact. No acute nasal bone fracture. Orbits: Negative. No traumatic or inflammatory finding. Sinuses: Clear. Soft tissues: Fatty replaced parotid glands. 7  mm left intraparotid nodule. Scattered submandibular lymph nodes. Limited intracranial: No significant or unexpected finding. IMPRESSION: 1. Mild motion degradation. No definite CT evidence for acute facial bone fracture. 2. 7 mm left intraparotid nodule, possibly a lymph node but nonspecific in appearance. Nonemergent MRI could be considered for further evaluation. Electronically Signed   By: Donavan Foil M.D.   On: 12/19/2019 21:33    Procedures Procedures (including critical care time)  Medications Ordered in ED Medications  rOPINIRole (REQUIP) tablet 2 mg (has no administration in time range)    gabapentin (NEURONTIN) capsule 300 mg (300 mg Oral Given 12/19/19 2227)  tiZANidine (ZANAFLEX) tablet 2 mg (2 mg Oral Given 12/19/19 2227)  fentaNYL (SUBLIMAZE) injection 50 mcg (50 mcg Intramuscular Given 12/19/19 2231)   ED Course  I have reviewed the triage vital signs and the nursing notes.  Pertinent labs & imaging results that were available during my care of the patient were reviewed by me and considered in my medical decision making (see chart for details).  69 year old female presents after mechanical fall. No preceding symptoms prior to fall.  No syncope.  Does not use her walker as she is supposed to for ambulation.  Denies anticoagulation.  Ecchymosis to right inferior periorbital region however no evidence of acute traumatic eye injury. Echymosis to dorsal right hand. No scaphoid tenderness. Ambulatory into ED without difficulty. Plan imaging and reassess.  Imaging personally reviewed and interpreted WO acute findings.  Patient reassessed.  Ambulatory in ED without difficulty at baseline with a walker without assist.  Tolerating p.o. intake without difficulty.  Her daughter has been calling for updates.  Patient did not want me to call her as she states she has spoken with her already.  Patient likely with her chronic falls which she is followed by neurology with.  Stressed importance of using her walker at home when she is supposed to.  Imaging did show possible chronic changes to her right olecranon.  She is able to flex and extend, pronate and supinate.  I will suspicion for acute fracture.  We will have her follow-up outpatient with her multiple specialist.  The patient has been appropriately medically screened and/or stabilized in the ED. I have low suspicion for any other emergent medical condition which would require further screening, evaluation or treatment in the ED or require inpatient management.  Patient is hemodynamically stable and in no acute distress.  Patient able to  ambulate in department prior to ED.  Evaluation does not show acute pathology that would require ongoing or additional emergent interventions while in the emergency department or further inpatient treatment.  I have discussed the diagnosis with the patient and answered all questions.  Pain is been managed while in the emergency department and patient has no further complaints prior to discharge.  Patient is comfortable with plan discussed in room and is stable for discharge at this time.  I have discussed strict return precautions for returning to the emergency department.  Patient was encouraged to follow-up with PCP/specialist refer to at discharge.   Clinical Course as of Dec 18 2257  Mon Dec 19, 2019  2241 IMPRESSION: No fracture of the pelvis or left hip.    DG HIP UNILAT WITH PELVIS 2-3 VIEWS LEFT [BH]  2242 IMPRESSION: No acute fracture of the right lower leg.    DG Tibia/Fibula Right [BH]  2242 IMPRESSION: Reverse right shoulder arthroplasty without complication. No acute fracture.     DG Shoulder Right [BH]  2243 IMPRESSION:  No fracture of the pelvis or right hip.  DG HIP UNILAT WITH PELVIS 2-3 VIEWS RIGHT [BH]  2243 IMPRESSION: 1. No acute fracture or subluxation. 2. Multifocal osteoarthritis, prominently involving the thumb carpometacarpal joint and the digits. Possible erosive osteoarthritis involving the second and third DIP.  DG Hand Complete Right [BH]  2243 IMPRESSION: Cortical irregularity involving the radial neck felt to represent degenerative osteophytes, nondisplaced fracture could have a similar appearance. Recommend correlation for focal tenderness. If there is clinical concern for fracture of the elbow, recommend dedicated elbow radiographs.  DG Forearm Right [BH]  2244 IMPRESSION: No acute chest findings.  DG Chest 2 View [BH]  2244 IMPRESSION: 1. No evidence of acute fracture. 2. Stable degenerative and postsurgical change.  DG Lumbar Spine  Complete [BH]  2244  IMPRESSION: Straightening of the cervical spine with postsurgical changes C3 through C7. No acute osseous abnormality.  CT Cervical Spine Wo Contrast [BH]  2245 IMPRESSION: 1. Mild motion degradation. No definite CT evidence for acute facial bone fracture. 2. 7 mm left intraparotid nodule, possibly a lymph node but nonspecific in appearance. Nonemergent MRI could be considered for further evaluation.  CT Maxillofacial Wo Contrast [BH]  2245 IMPRESSION: 1. No CT evidence for acute intracranial abnormality. 2. Mild atrophy  CT Head Wo Contrast [BH]    Clinical Course User Index [BH] Kyonna Frier A, PA-C   MDM Rules/Calculators/A&P                          Final Clinical Impression(s) / ED Diagnoses Final diagnoses:  Fall, initial encounter  Injury of head, initial encounter  Right hand pain    Rx / DC Orders ED Discharge Orders    None       Mahima Hottle A, PA-C 12/19/19 2259    Davonna Belling, MD 12/19/19 2309

## 2019-12-19 NOTE — ED Notes (Signed)
Patient transported to X-ray 

## 2019-12-19 NOTE — ED Notes (Signed)
Pt discharged from ED; instructions provided and scripts given; Pt encouraged to return to ED if symptoms worsen and to f/u with PCP; Pt verbalized understanding of all instructions 

## 2019-12-19 NOTE — ED Notes (Signed)
Crystal [name] daughter [phone]

## 2019-12-19 NOTE — ED Triage Notes (Signed)
Pt. Stated, I stepped and trip and went down 3 steps.

## 2019-12-19 NOTE — Discharge Instructions (Signed)
Follow-up with your orthopedic specialist if you continue to have pain.  I would let your neurologist know you are seen the emergency department for another fall.  Continue to take your normal home medications.  Return for any worsening symptoms.

## 2019-12-20 ENCOUNTER — Other Ambulatory Visit: Payer: Self-pay

## 2019-12-20 ENCOUNTER — Ambulatory Visit: Payer: Medicare PPO | Admitting: Orthotics

## 2019-12-20 DIAGNOSIS — G629 Polyneuropathy, unspecified: Secondary | ICD-10-CM

## 2019-12-20 DIAGNOSIS — Z8673 Personal history of transient ischemic attack (TIA), and cerebral infarction without residual deficits: Secondary | ICD-10-CM

## 2019-12-20 DIAGNOSIS — R2681 Unsteadiness on feet: Secondary | ICD-10-CM

## 2019-12-20 NOTE — Progress Notes (Signed)
Patient came in today for Eval/assessment for [place].  Patient presents with a history of falling, and also fearful of falling.  Balance tests performed and patient does have issues keeping balance especially in foot to heel test.  Patient has week dorsiflexors and well as inverters/everters muscle group.  Demonstrants ankle instability.   Patient has had recent falls resulting in injury; she presents today with multiple trauma to face, rt arm and rt leg due to fall this past weekend.  She presented to ED yesterday.

## 2019-12-20 NOTE — Progress Notes (Deleted)
Assessment and Plan:  There are no diagnoses linked to this encounter.    Further disposition pending results of labs. Discussed med's effects and SE's.   Over 30 minutes of exam, counseling, chart review, and critical decision making was performed.   Future Appointments  Date Time Provider [place]  12/21/2019  2:45 PM [name], NP [facility] None  03/20/2020 10:00 AM Unk Pinto, MD [facility] None    ------------------------------------------------------------------------------------------------------------------   HPI There were no vitals taken for this visit.  69 y.o.female with hx significant for gait disorder, depression, extensive orthopedic history presents for ED follow up following fall on 12/20/2019.   Presented after mechanical fall. States she was bending down off her stairs to spray ants when she reached forward and fell. She admits to hitting her head as well as her right upper and lower extremity. No preceding symptoms prior to fall.  No syncope.  Patient states she has frequent falls due to her gait disorder.  She has not been using her walker as she is supposed to.  She presented with ecchymosis to inferior portion of R eye, R shoulder pain, R hand pain, R tib/fib pain. Had extensive xray imaging in ED and CT head/cervical/maxillofacial which showed no evidence of acute traumatic injury, ambulatory in ED without difficulty. Discharged with instructions to follow up with her multiple orthopedic specialists.   She presents for follow up concerned with extensive bruising over her breast and back *** She   Past Medical History:  Diagnosis Date  . Arthritis   . ASCVD (arteriosclerotic cardiovascular disease)   . Closed nondisplaced subtrochanteric fracture of left femur ([place]) 09/03/2017   September 2018.  Status post surgery by Dr. Marcelino Scot  . Depression   . Fibromyalgia   . Gait disorder 10/26/2012  . GERD (gastroesophageal reflux disease)   . GI  bleed   . Hyperlipidemia   . Hypertension   . Hypothyroid   . Periprosthetic supracondylar fracture of femur, left  03/23/2017  . Restless leg syndrome   . Rhinitis 06/25/2010  . Sleep apnea   . TIA (transient ischemic attack)    3        . Vitamin D deficiency      Allergies  Allergen Reactions  . Atorvastatin Other (See Comments)    Extreme pain  . Codeine Other (See Comments)    Headache  . Pravastatin Other (See Comments)    Exreme pain  . Statins Other (See Comments)    Extreme pain  . Amitriptyline Other (See Comments)    Unspecified  . Fish Oil Nausea Only  . Hydrocodone Other (See Comments)    Itching Other Reaction: Other reaction   . Linseed Oil Other (See Comments)    Other Reaction: Other reaction  . Nuvigil [Armodafinil] Other (See Comments)    Unspecified  . Sulfa Antibiotics Other (See Comments)  . Erythromycin Other (See Comments)    Reaction unspecified  . Erythromycin Base Rash  . Flax Seed [Bio-Flax] Rash    Linseed  . Hydrocodone-Acetaminophen Rash  . Morphine Nausea And Vomiting and Rash  . Nsaids Nausea Only and Other (See Comments)    Other Reaction: Other reaction  . Sertraline Rash  . Sinemet [Carbidopa W-Levodopa] Rash  . Sulfamethoxazole Rash  . Tolmetin Nausea Only    Current Outpatient Medications on File Prior to Visit  Medication Sig  . Adalimumab 40 MG/0.4ML PNKT Inject into the skin.  [name] aspirin EC 81 MG tablet Take 81 mg by mouth  daily.  . bisoprolol (ZEBETA) 10 MG tablet Take 1 tablet Daily for BP  . buPROPion (WELLBUTRIN XL) 300 MG 24 hr tablet Take 1 tablet Daily for Mood, Focus & Concentration  . Cyanocobalamin (B-12 SL) Place under the tongue daily.  . diclofenac sodium (VOLTAREN) 1 % GEL Apply 2 g topically 2 (two) times daily as needed.   . DULoxetine (CYMBALTA) 60 MG capsule Take 1 capsule Daily for Chronic Pain  . fenofibrate micronized (LOFIBRA) 134 MG capsule TAKE 1 CAPSULE BY MOUTH EVERY DAY BEFORE BREAKFAST  .  fluticasone (FLONASE) 50 MCG/ACT nasal spray USE TWO SPRAYS IN EACH NOSTRIL DAILY AS NEEDED  . gabapentin (NEURONTIN) 300 MG capsule Take 1 capsule 3 x/day for Chronic Pain  . levothyroxine (SYNTHROID) 100 MCG tablet TAKE 1 TABLET BY MOUTH ON TUES, WED, THURS, SAT AND SUN. TAKE 1& 1/2 TABLET ON MON AND FRI (Patient taking differently: daily. )  . lidocaine (XYLOCAINE) 2 % jelly apply small amount topically to affected area 2-3 times a day, knees, legs.  . meclizine (ANTIVERT) 25 MG tablet TAKE 1 TABLET (25 MG TOTAL) BY MOUTH 3 (THREE) TIMES DAILY AS NEEDED FOR DIZZINESS.  [name] metroNIDAZOLE (FLAGYL) 500 MG tablet Take 500 mg by mouth See admin instructions. Takes 1 tab three days a week  . NEOMYCIN-POLYMYXIN-HYDROCORTISONE (CORTISPORIN) 1 % SOLN OTIC solution Place 3 drops into both ears 4 (four) times daily.  [name] oxybutynin (DITROPAN) 5 MG tablet TAKE 1 TABLET 2 X /DAY FOR BLADDER CONTROL  . pantoprazole (PROTONIX) 40 MG tablet Take 1 tablet Daily for Indigestion & Acid Reflux  . Phenylephrine-guaiFENesin (MUCUS RELIEF D PO) Take by mouth daily.  . polyethylene glycol (MIRALAX / GLYCOLAX) packet Take 17 g by mouth daily as needed for mild constipation.  [name] Respiratory Therapy Supplies (FLUTTER) DEVI Use flutter valve 3 times a day  . rOPINIRole (REQUIP) 2 MG tablet Take 1 tablet 4 x /day for Restless Legs  . tiZANidine (ZANAFLEX) 2 MG tablet TAKE 1/2 TO 1 TABLET 3 TIMES A DAY AS NEEDED FOR MUSCLE SPASMS  . TRAMADOL HCL ER PO Take by mouth as needed.    No current facility-administered medications on file prior to visit.    ROS: all negative except above.   Physical Exam:  There were no vitals taken for this visit.  General Appearance: Well nourished, in no apparent distress. Eyes: PERRLA, EOMs, conjunctiva no swelling or erythema Sinuses: No Frontal/maxillary tenderness ENT/Mouth: Ext aud canals clear, TMs without erythema, bulging. No erythema, swelling, or exudate on post pharynx.  Tonsils  not swollen or erythematous. Hearing normal.  Neck: Supple, thyroid normal.  Respiratory: Respiratory effort normal, BS equal bilaterally without rales, rhonchi, wheezing or stridor.  Cardio: RRR with no MRGs. Brisk peripheral pulses without edema.  Abdomen: Soft, + BS.  Non tender, no guarding, rebound, hernias, masses. Lymphatics: Non tender without lymphadenopathy.  Musculoskeletal: Full ROM, 5/5 strength, normal gait.  Skin: Warm, dry without rashes, lesions, ecchymosis.  Neuro: Cranial nerves intact. Normal muscle tone, no cerebellar symptoms. Sensation intact.  Psych: Awake and oriented X 3, normal affect, Insight and Judgment appropriate.     Izora Ribas, NP 1:45 PM [facility]

## 2019-12-21 ENCOUNTER — Encounter: Payer: Self-pay | Admitting: Adult Health

## 2019-12-21 ENCOUNTER — Ambulatory Visit: Payer: Medicare PPO | Admitting: Adult Health

## 2019-12-21 VITALS — BP 118/68 | HR 71 | Temp 97.5°F | Wt 166.6 lb

## 2019-12-21 DIAGNOSIS — M791 Myalgia, unspecified site: Secondary | ICD-10-CM | POA: Diagnosis not present

## 2019-12-21 DIAGNOSIS — R269 Unspecified abnormalities of gait and mobility: Secondary | ICD-10-CM

## 2019-12-21 DIAGNOSIS — M961 Postlaminectomy syndrome, not elsewhere classified: Secondary | ICD-10-CM | POA: Diagnosis not present

## 2019-12-21 DIAGNOSIS — W19XXXD Unspecified fall, subsequent encounter: Secondary | ICD-10-CM

## 2019-12-21 DIAGNOSIS — R58 Hemorrhage, not elsewhere classified: Secondary | ICD-10-CM | POA: Diagnosis not present

## 2019-12-21 DIAGNOSIS — M542 Cervicalgia: Secondary | ICD-10-CM | POA: Diagnosis not present

## 2019-12-21 DIAGNOSIS — M5416 Radiculopathy, lumbar region: Secondary | ICD-10-CM | POA: Diagnosis not present

## 2019-12-21 NOTE — Progress Notes (Signed)
Assessment and Plan:  Crystal [name] was seen today for follow-up.  Diagnoses and all orders for this visit:  Gait disorder Continue follow up with neuro specialist Reminded consistent use of walker Continue with PT  Fall, subsequent encounter/ecchymoses Extensive workup in ED on 12/19/2019, thorough imaging concluded no evidence of acute traumatic injury; some ecchymoses of face, breast, lower back without other concerning features on exam, felt to be superficial soft tissue injuries reasonable for recent fall  Advised may feel worse day 3-4 prior to improving slowly over next several weeks Complex ortho hx - continue follow up with ortho specialists as scheduled She reports pain is fairly managed with current medications She is reassured by our discussion and my exam today and declines further needs at this time Follow up as scheduled or if new or worsening concerns  Further disposition pending results of labs. Discussed med's effects and SE's.   Over 30 minutes of exam, counseling, chart review, and critical decision making was performed.   Future Appointments  Date Time Provider [place]  03/20/2020 10:00 AM Unk Pinto, MD [facility] None    ------------------------------------------------------------------------------------------------------------------   HPI BP 118/68   Pulse 71   Temp (!) 97.5 F (36.4 C)   Wt 166 lb 9.6 oz (75.6 kg)   SpO2 97%   BMI 28.60 kg/m   69 y.o.female with hx significant for gait disorder, depression, extensive orthopedic history presents for ED follow up following fall on 12/19/2019.   Presented after mechanical fall. States she was bending down off her stairs to spray ants when she reached forward and fell. She admits to hitting her head as well as her right upper and lower extremity. No preceding symptoms prior to fall.  No syncope.  Patient states she has frequent falls due to her gait disorder.  She has not been using her walker as  she is supposed to.  She presented with ecchymosis to inferior portion of R eye, R shoulder pain, R hand pain, R tib/fib pain. Had extensive xray imaging in ED and CT head/cervical/maxillofacial which showed no evidence of acute traumatic injury, ambulatory in ED without difficulty. Discharged with instructions to follow up with her multiple orthopedic specialists.   She presents for follow up concerned with some bruising and tenderness of R breast and L lower back. She reports is able to manage aching/pain with acetaminophen, topical voltaren, topical lidocaine, tramadol (hasn't needed). She has follow up with spine specialist next week.   Past Medical History:  Diagnosis Date  . Arthritis   . ASCVD (arteriosclerotic cardiovascular disease)   . Closed nondisplaced subtrochanteric fracture of left femur ([place]) 09/03/2017   September 2018.  Status post surgery by Dr. Marcelino Scot  . Depression   . Fibromyalgia   . Gait disorder 10/26/2012  . GERD (gastroesophageal reflux disease)   . GI bleed   . Hyperlipidemia   . Hypertension   . Hypothyroid   . Periprosthetic supracondylar fracture of femur, left  03/23/2017  . Restless leg syndrome   . Rhinitis 06/25/2010  . Sleep apnea   . TIA (transient ischemic attack)    3        . Vitamin D deficiency      Allergies  Allergen Reactions  . Atorvastatin Other (See Comments)    Extreme pain  . Codeine Other (See Comments)    Headache  . Pravastatin Other (See Comments)    Exreme pain  . Statins Other (See Comments)    Extreme pain  . Amitriptyline  Other (See Comments)    Unspecified  . Fish Oil Nausea Only  . Hydrocodone Other (See Comments)    Itching Other Reaction: Other reaction   . Linseed Oil Other (See Comments)    Other Reaction: Other reaction  . Nuvigil [Armodafinil] Other (See Comments)    Unspecified  . Sulfa Antibiotics Other (See Comments)  . Erythromycin Other (See Comments)    Reaction unspecified  . Erythromycin Base  Rash  . Flax Seed [Bio-Flax] Rash    Linseed  . Hydrocodone-Acetaminophen Rash  . Morphine Nausea And Vomiting and Rash  . Nsaids Nausea Only and Other (See Comments)    Other Reaction: Other reaction  . Sertraline Rash  . Sinemet [Carbidopa W-Levodopa] Rash  . Sulfamethoxazole Rash  . Tolmetin Nausea Only    Current Outpatient Medications on File Prior to Visit  Medication Sig  . Adalimumab 40 MG/0.4ML PNKT Inject into the skin.  [name] aspirin EC 81 MG tablet Take 81 mg by mouth daily.  . bisoprolol (ZEBETA) 10 MG tablet Take 1 tablet Daily for BP  . buPROPion (WELLBUTRIN XL) 300 MG 24 hr tablet Take 1 tablet Daily for Mood, Focus & Concentration  . Cyanocobalamin (B-12 SL) Place under the tongue daily.  . diclofenac sodium (VOLTAREN) 1 % GEL Apply 2 g topically 2 (two) times daily as needed.   . DULoxetine (CYMBALTA) 60 MG capsule Take 1 capsule Daily for Chronic Pain  . fenofibrate micronized (LOFIBRA) 134 MG capsule TAKE 1 CAPSULE BY MOUTH EVERY DAY BEFORE BREAKFAST  . fluconazole (DIFLUCAN) 150 MG tablet Take 150 mg by mouth See admin instructions. Three days a week for yeast.  . fluticasone (FLONASE) 50 MCG/ACT nasal spray USE TWO SPRAYS IN EACH NOSTRIL DAILY AS NEEDED  . gabapentin (NEURONTIN) 300 MG capsule Take 1 capsule 3 x/day for Chronic Pain  . levothyroxine (SYNTHROID) 100 MCG tablet TAKE 1 TABLET BY MOUTH ON TUES, WED, THURS, SAT AND SUN. TAKE 1& 1/2 TABLET ON MON AND FRI (Patient taking differently: daily. )  . lidocaine (XYLOCAINE) 2 % jelly apply small amount topically to affected area 2-3 times a day, knees, legs.  . meclizine (ANTIVERT) 25 MG tablet TAKE 1 TABLET (25 MG TOTAL) BY MOUTH 3 (THREE) TIMES DAILY AS NEEDED FOR DIZZINESS.  [name] metroNIDAZOLE (FLAGYL) 500 MG tablet Take 500 mg by mouth See admin instructions. Patient states she takes this BID daily   . NEOMYCIN-POLYMYXIN-HYDROCORTISONE (CORTISPORIN) 1 % SOLN OTIC solution Place 3 drops into both ears 4 (four) times  daily.  [name] oxybutynin (DITROPAN) 5 MG tablet TAKE 1 TABLET 2 X /DAY FOR BLADDER CONTROL  . pantoprazole (PROTONIX) 40 MG tablet Take 1 tablet Daily for Indigestion & Acid Reflux  . Phenylephrine-guaiFENesin (MUCUS RELIEF D PO) Take by mouth daily.  . polyethylene glycol (MIRALAX / GLYCOLAX) packet Take 17 g by mouth daily as needed for mild constipation.  [name] Respiratory Therapy Supplies (FLUTTER) DEVI Use flutter valve 3 times a day  . rOPINIRole (REQUIP) 2 MG tablet Take 1 tablet 4 x /day for Restless Legs  . tiZANidine (ZANAFLEX) 2 MG tablet TAKE 1/2 TO 1 TABLET 3 TIMES A DAY AS NEEDED FOR MUSCLE SPASMS  . TRAMADOL HCL ER PO Take by mouth as needed.    No current facility-administered medications on file prior to visit.    ROS: all negative except above.   Physical Exam:  BP 118/68   Pulse 71   Temp (!) 97.5 F (36.4 C)   Wt  166 lb 9.6 oz (75.6 kg)   SpO2 97%   BMI 28.60 kg/m   General Appearance: Well nourished, elderly female, with walker, in no acute distress. Eyes: PERRLA, EOMs, conjunctiva no swelling or erythema. Chronically drooping R eye lid.  Sinuses: No Frontal/maxillary tenderness ENT/Mouth: Ext aud canals clear, TMs without erythema, bulging. No erythema, swelling, or exudate on post pharynx.  Tonsils not swollen or erythematous. Hearing normal.  Neck: Supple, thyroid normal.  Respiratory: Respiratory effort normal, BS equal bilaterally without rales, rhonchi, wheezing or stridor.  Cardio: RRR with no MRGs. Brisk peripheral pulses without edema.  Abdomen: Soft, + BS.  Non tender, no guarding, rebound, hernias, masses. Lymphatics: Non tender without lymphadenopathy.  Musculoskeletal: No obvious or palpable bony abnormality, tenderness along left lower back, right shin/ankle without effusion, defer full exam as had extensively yesterday with imaging, slow gait with walker, has chronic R knee effusion consistent with baseline, mildly warm to touch without erythema Skin:  Warm, dry without rashes, lesions; she has ecchymosis surrounding R eye, cheek; mild bruising to right inner breast without palpable abnormality, mild ecchymoses to left lower back/upper hip in linear pattern, no settling ecchymosis of flank area Neuro:  Normal muscle tone, Sensation intact.  Psych: Awake and oriented X 3, normal affect, Insight and Judgment appropriate.     Izora Ribas, NP 3:31 PM [facility]

## 2019-12-22 ENCOUNTER — Encounter: Payer: Self-pay | Admitting: Neurology

## 2019-12-22 NOTE — Telephone Encounter (Signed)
Hi Crystal [name] I called [facility] and they don't have anything sooner patient is also on CX list . [facility] suggest that Dr. Rexene Alberts can call the [place] line at [phone] which is the physician line and she maybe can get her in sooner.  Called and let patient as well.

## 2019-12-26 DIAGNOSIS — T148XXA Other injury of unspecified body region, initial encounter: Secondary | ICD-10-CM | POA: Diagnosis not present

## 2019-12-26 DIAGNOSIS — B379 Candidiasis, unspecified: Secondary | ICD-10-CM | POA: Diagnosis not present

## 2019-12-26 DIAGNOSIS — L439 Lichen planus, unspecified: Secondary | ICD-10-CM | POA: Diagnosis not present

## 2019-12-27 DIAGNOSIS — H26493 Other secondary cataract, bilateral: Secondary | ICD-10-CM | POA: Diagnosis not present

## 2019-12-27 DIAGNOSIS — S0012XA Contusion of left eyelid and periocular area, initial encounter: Secondary | ICD-10-CM | POA: Diagnosis not present

## 2019-12-27 DIAGNOSIS — S0011XA Contusion of right eyelid and periocular area, initial encounter: Secondary | ICD-10-CM | POA: Diagnosis not present

## 2019-12-27 DIAGNOSIS — H43813 Vitreous degeneration, bilateral: Secondary | ICD-10-CM | POA: Diagnosis not present

## 2020-01-03 DIAGNOSIS — G4733 Obstructive sleep apnea (adult) (pediatric): Secondary | ICD-10-CM | POA: Diagnosis not present

## 2020-01-06 ENCOUNTER — Other Ambulatory Visit: Payer: Self-pay | Admitting: Internal Medicine

## 2020-01-07 ENCOUNTER — Other Ambulatory Visit: Payer: Self-pay | Admitting: Internal Medicine

## 2020-01-09 ENCOUNTER — Other Ambulatory Visit: Payer: Self-pay | Admitting: Adult Health

## 2020-01-09 ENCOUNTER — Other Ambulatory Visit: Payer: Self-pay | Admitting: Internal Medicine

## 2020-01-09 MED ORDER — FENOFIBRATE MICRONIZED 134 MG PO CAPS: ORAL_CAPSULE | ORAL | 0 refills | Status: DC

## 2020-01-11 ENCOUNTER — Other Ambulatory Visit: Payer: Self-pay

## 2020-01-11 MED ORDER — GABAPENTIN 300 MG PO CAPS: ORAL_CAPSULE | ORAL | 0 refills | Status: DC

## 2020-01-20 ENCOUNTER — Other Ambulatory Visit: Payer: Self-pay | Admitting: Internal Medicine

## 2020-01-20 DIAGNOSIS — I10 Essential (primary) hypertension: Secondary | ICD-10-CM

## 2020-01-20 MED ORDER — BISOPROLOL FUMARATE 10 MG PO TABS: ORAL_TABLET | ORAL | 3 refills | Status: DC

## 2020-01-23 ENCOUNTER — Other Ambulatory Visit: Payer: Self-pay | Admitting: Internal Medicine

## 2020-01-23 DIAGNOSIS — K219 Gastro-esophageal reflux disease without esophagitis: Secondary | ICD-10-CM

## 2020-01-24 ENCOUNTER — Other Ambulatory Visit: Payer: Self-pay | Admitting: *Deleted

## 2020-01-24 DIAGNOSIS — I10 Essential (primary) hypertension: Secondary | ICD-10-CM

## 2020-01-24 MED ORDER — BISOPROLOL FUMARATE 10 MG PO TABS: ORAL_TABLET | ORAL | 3 refills | Status: DC

## 2020-02-01 DIAGNOSIS — K5000 Crohn's disease of small intestine without complications: Secondary | ICD-10-CM | POA: Diagnosis not present

## 2020-02-01 DIAGNOSIS — K219 Gastro-esophageal reflux disease without esophagitis: Secondary | ICD-10-CM | POA: Diagnosis not present

## 2020-02-05 ENCOUNTER — Other Ambulatory Visit: Payer: Self-pay | Admitting: Internal Medicine

## 2020-02-06 ENCOUNTER — Telehealth: Payer: Self-pay | Admitting: Podiatry

## 2020-02-06 NOTE — Telephone Encounter (Signed)
Pt would like a call about pricing and payment

## 2020-02-07 ENCOUNTER — Ambulatory Visit (INDEPENDENT_AMBULATORY_CARE_PROVIDER_SITE_OTHER): Payer: Medicare PPO | Admitting: Orthotics

## 2020-02-07 ENCOUNTER — Other Ambulatory Visit: Payer: Medicare PPO | Admitting: Orthotics

## 2020-02-07 ENCOUNTER — Other Ambulatory Visit: Payer: Self-pay

## 2020-02-07 DIAGNOSIS — M778 Other enthesopathies, not elsewhere classified: Secondary | ICD-10-CM | POA: Diagnosis not present

## 2020-02-07 DIAGNOSIS — G629 Polyneuropathy, unspecified: Secondary | ICD-10-CM | POA: Diagnosis not present

## 2020-02-07 DIAGNOSIS — R2681 Unsteadiness on feet: Secondary | ICD-10-CM | POA: Diagnosis not present

## 2020-02-07 DIAGNOSIS — Z8673 Personal history of transient ischemic attack (TIA), and cerebral infarction without residual deficits: Secondary | ICD-10-CM

## 2020-02-07 NOTE — Progress Notes (Signed)
Patient came in today to pick up [place] (Bilateral).   Patient was able to don brace independently and brace was check for fit to custom.   Patient was observed walking with brace and gait was improved as well as stability.   Patient was advised of care and wearing instructions.  Advised to notify practice if there were any issues; especially skin irritation.

## 2020-02-08 ENCOUNTER — Other Ambulatory Visit: Payer: Self-pay | Admitting: Internal Medicine

## 2020-02-12 ENCOUNTER — Encounter: Payer: Self-pay | Admitting: Podiatry

## 2020-02-21 ENCOUNTER — Telehealth: Payer: Self-pay

## 2020-02-21 NOTE — Telephone Encounter (Signed)
Pt called today concerned about ankles braces and that they are affecting her toes causing swelling and pain. Pt would like to schedule an appointment with Dr. Paulla Dolly.

## 2020-02-23 DIAGNOSIS — M961 Postlaminectomy syndrome, not elsewhere classified: Secondary | ICD-10-CM | POA: Diagnosis not present

## 2020-02-23 DIAGNOSIS — M5416 Radiculopathy, lumbar region: Secondary | ICD-10-CM | POA: Diagnosis not present

## 2020-02-23 NOTE — Telephone Encounter (Signed)
Lvm for pt to call and schedule appt

## 2020-02-23 NOTE — Telephone Encounter (Signed)
Thank you :)

## 2020-02-27 ENCOUNTER — Ambulatory Visit: Payer: Medicare PPO | Admitting: Podiatry

## 2020-02-27 ENCOUNTER — Encounter: Payer: Self-pay | Admitting: Podiatry

## 2020-02-27 ENCOUNTER — Other Ambulatory Visit: Payer: Self-pay

## 2020-02-27 VITALS — Temp 95.8°F

## 2020-02-27 DIAGNOSIS — L84 Corns and callosities: Secondary | ICD-10-CM | POA: Diagnosis not present

## 2020-02-27 DIAGNOSIS — R2681 Unsteadiness on feet: Secondary | ICD-10-CM

## 2020-02-27 NOTE — Progress Notes (Signed)
Subjective:   Patient ID: Crystal [name], female   DOB: 69 y.o.   MRN: [id number]   HPI Patient presents about her gait stating she has had some falls and she is having trouble with her balance braces stating that bothered her toes and also she does have lesions on the sides of big toes that gets sore bilateral   ROS      Objective:  Physical Exam  Neurovascular status unchanged with patient having balance bracing found to have digital deformities with discomfort and is having numerous falls recently.  Patient does have thick keratotic lesion underneath the first metatarsal head medial side bilateral     Assessment:  Gait instability with history of falls with the patient planning on going to [facility] to see a specialist with keratotic lesion formation     Plan:  Reviewed condition and recommended she see Liliane Channel to have the balance braces hopefully adjusted and I do like the fact she is going to see a specialist.  I then went ahead and using sterile sharp instrumentation debrided lesions bilateral and patient will be seen back to recheck

## 2020-03-07 DIAGNOSIS — Z20828 Contact with and (suspected) exposure to other viral communicable diseases: Secondary | ICD-10-CM | POA: Diagnosis not present

## 2020-03-12 ENCOUNTER — Other Ambulatory Visit: Payer: Self-pay

## 2020-03-12 ENCOUNTER — Ambulatory Visit: Payer: Self-pay | Admitting: Orthotics

## 2020-03-12 ENCOUNTER — Encounter: Payer: Self-pay | Admitting: Physician Assistant

## 2020-03-12 ENCOUNTER — Ambulatory Visit (INDEPENDENT_AMBULATORY_CARE_PROVIDER_SITE_OTHER): Payer: Medicare PPO | Admitting: Physician Assistant

## 2020-03-12 VITALS — BP 110/60 | HR 78 | Temp 97.5°F | Resp 14

## 2020-03-12 DIAGNOSIS — H6982 Other specified disorders of Eustachian tube, left ear: Secondary | ICD-10-CM

## 2020-03-12 DIAGNOSIS — M25522 Pain in left elbow: Secondary | ICD-10-CM | POA: Diagnosis not present

## 2020-03-12 DIAGNOSIS — H9202 Otalgia, left ear: Secondary | ICD-10-CM

## 2020-03-12 DIAGNOSIS — J309 Allergic rhinitis, unspecified: Secondary | ICD-10-CM

## 2020-03-12 DIAGNOSIS — H6063 Unspecified chronic otitis externa, bilateral: Secondary | ICD-10-CM

## 2020-03-12 DIAGNOSIS — R220 Localized swelling, mass and lump, head: Secondary | ICD-10-CM | POA: Diagnosis not present

## 2020-03-12 MED ORDER — NEOMYCIN-POLYMYXIN-HC 1 % OT SOLN: 3.0000 [drp] | Freq: Four times a day (QID) | OTIC | 0 refills | Status: DC

## 2020-03-12 MED ORDER — FLUTICASONE PROPIONATE 50 MCG/ACT NA SUSP: NASAL | 3 refills | Status: DC

## 2020-03-12 NOTE — Patient Instructions (Signed)
Follow up with Dr. Lucia Gaskins  Can do a steroid nasal spary 1-2 sparys at night each nostril.  Examples are nasonex, flonase, nasocort- they are over the counter.  Remember to spray each nostril twice towards the outer part of your eye.   Do not sniff but instead pinch your nose and tilt your head back to help the medicine get into your sinuses.   The best time to do this is at bedtime.  Stop if you get blurred vision or nose bleeds.   THIS WILL TAKE 7 DAYS TO WORK AND IS BETTER IF YOU START BEFORE SYMPTOMS SO IF YOU HAVE A SEASON OR TIME OF THE YEAR YOU ALWAYS GET A COLD, START BEFORE THAT!   Your ears and sinuses are connected by the eustachian tube. When your sinuses are inflamed, this can close off the tube and cause fluid to collect in your middle ear. This can then cause dizziness, popping, clicking, ringing, and echoing in your ears. This is often NOT an infection and does NOT require antibiotics, it is caused by inflammation so the treatments help the inflammation. This can take a long time to get better so please be patient.

## 2020-03-12 NOTE — Progress Notes (Signed)
Subjective:    Patient ID: Crystal [name], female    DOB: 03/17/1951, 69 y.o.   MRN: [id number]  HPI 69 y.o. WF presents with bilateral ear pain x 1 year. She has had negative COVID test.  Had COVID in April.   She had left ear pain and left throat tightness/pain with swallowing last week. She now continues to have left ear pain and just a tightness in her neck. No fever, chills.  She is unable to breath through her left nostril, she has had several falls onto her nose.  She was on flonase but she had lichen planus and candida in her mouth, following with derm and stopped the flonase on her own.  Has right sided vertigo.  She has seen Dr. Lucia Gaskins in the past.  No pills, water, food getting stuck in her throat.   She has had several falls, seeing neurology for imbalance. Has had right sided vertigo. She had a fall in June, had abnormal right forarm Xray with cortical irregularity in radial neck possible DJD versus non displaced fracture- she continue to have pain in the right elbow with flexion. Will get Xray dedicated to elbow.   She is on protonix. Following with Dr. Watt Climes.   Blood pressure 110/60, pulse 78, temperature (!) 97.5 F (36.4 C), resp. rate 14, SpO2 98 %.  Medications  Current Outpatient Medications (Endocrine & Metabolic):  .  levothyroxine (SYNTHROID) 100 MCG tablet, TAKE 1 TABLET BY MOUTH ON TUES, WED, THURS, SAT AND SUN. TAKE 1& 1/2 TABLET ON MON AND FRI (Patient taking differently: daily. )  Current Outpatient Medications (Cardiovascular):  .  bisoprolol (ZEBETA) 10 MG tablet, Take 1 tablet Daily for BP .  fenofibrate micronized (LOFIBRA) 134 MG capsule, Take 1 tablet 1 x  /day for Triglycerides (Blood Fats)  Current Outpatient Medications (Respiratory):  .  fluticasone (FLONASE) 50 MCG/ACT nasal spray, USE TWO SPRAYS IN EACH NOSTRIL DAILY AS NEEDED .  Phenylephrine-guaiFENesin (MUCUS RELIEF D PO), Take by mouth daily. .  promethazine-dextromethorphan  (PROMETHAZINE-DM) 6.25-15 MG/5ML syrup, Take 1 to 2 teaspoonfuls every 4 hour if needed for cough  Current Outpatient Medications (Analgesics):  [name]  Adalimumab 40 MG/0.4ML PNKT, Inject into the skin. [name]  aspirin EC 81 MG tablet, Take 81 mg by mouth daily. .  TRAMADOL HCL ER PO, Take by mouth as needed.   Current Outpatient Medications (Hematological):  [name]  Cyanocobalamin (B-12 SL), Place under the tongue daily.  Current Outpatient Medications (Other):  [name]  buPROPion (WELLBUTRIN XL) 300 MG 24 hr tablet, Take 1 tablet Daily for Mood, Focus & Concentration .  diclofenac sodium (VOLTAREN) 1 % GEL, Apply 2 g topically 2 (two) times daily as needed.  .  DULoxetine (CYMBALTA) 60 MG capsule, Take 1 capsule Daily for Chronic Pain .  fluconazole (DIFLUCAN) 150 MG tablet, Take 150 mg by mouth See admin instructions. Three days a week for yeast. .  gabapentin (NEURONTIN) 300 MG capsule, TAKE 1 CAPSULE BY MOUTH 3 TIMES A DAY FOR CHRONIC PAIN .  lidocaine (XYLOCAINE) 2 % jelly, apply small amount topically to affected area 2-3 times a day, knees, legs. .  meclizine (ANTIVERT) 25 MG tablet, TAKE 1 TABLET (25 MG TOTAL) BY MOUTH 3 (THREE) TIMES DAILY AS NEEDED FOR DIZZINESS. [name]  metroNIDAZOLE (FLAGYL) 500 MG tablet, Take 500 mg by mouth See admin instructions. Patient states she takes this BID daily  .  NEOMYCIN-POLYMYXIN-HYDROCORTISONE (CORTISPORIN) 1 % SOLN OTIC solution, Place 3 drops into both  ears 4 (four) times daily. [name]  oxybutynin (DITROPAN) 5 MG tablet, Take 1 tablet 2 x /day for Bladder Control .  pantoprazole (PROTONIX) 40 MG tablet, TAKE 1 TABLET DAILY FOR INDIGESTION & ACID REFLUX .  polyethylene glycol (MIRALAX / GLYCOLAX) packet, Take 17 g by mouth daily as needed for mild constipation. [name]  Respiratory Therapy Supplies (FLUTTER) DEVI, Use flutter valve 3 times a day .  rOPINIRole (REQUIP) 2 MG tablet, Take 1 tablet 4 x /day for Restless Legs .  tiZANidine (ZANAFLEX) 2 MG tablet, TAKE 1/2 TO 1 TABLET  3 TIMES A DAY AS NEEDED FOR MUSCLE SPASMS  Problem list She has Obstructive sleep apnea; RESTLESS LEG SYNDROME; Essential hypertension; Rhinitis; GERD; Crohn's Enterocolitis / Regional enteritis; Fibromyalgia; Gait disorder; Hyperlipidemia associated with type 2 diabetes mellitus ([place]); Hypothyroid; Depression, major, recurrent, in partial remission ([place]); Vitamin D deficiency; ASCVD; Medication management; Rotator cuff arthropathy; Obesity (BMI 30.0-34.9); Spinal stenosis in cervical region; IBS (irritable colon syndrome); Sleep talking; B12 deficiency anemia; Polypharmacy; Bronchiectasis without complication ([place]); Arthritis of carpometacarpal (CMC) joints of both thumbs; Bilateral bunions; Cervical disc disease; Constipation; Crohn's ileitis ([place]); Rotator cuff tear; Diverticulosis; History of adenomatous polyp of colon; Internal hemorrhoids; Knee pain; Other specified arthropathy involving shoulder region; S/P revision of total knee, left; Diet-controlled type 2 diabetes mellitus ([place]); Rupture of popliteal cyst of right knee region (complex cyst); History of recurrent TIAs; COVID-19 virus infection; and CKD stage 2 due to type 2 diabetes mellitus ([place]) on their problem list.   Review of Systems     Objective:   Physical Exam General Appearance: Well nourished, elderly female, with walker, in no acute distress. Eyes: PERRLA, EOMs, conjunctiva no swelling or erythema. Chronically drooping R eye lid.  Sinuses: No Frontal/maxillary tenderness, decreased/small left nasal passage with some erythema. ENT/Mouth: Ext aud canals clear, TMs without erythema, bulging. No erythema, swelling, or exudate on post pharynx.  Tonsils not swollen or erythematous. Hearing decreased. Neck: Supple, thyroid normal.  Respiratory: Respiratory effort normal, BS equal bilaterally without rales, rhonchi, wheezing or stridor.  Cardio: RRR with no MRGs. Brisk peripheral pulses without edema.  Abdomen: Soft, + BS.  Non  tender, no guarding, rebound, hernias, masses. Lymphatics: Non tender without lymphadenopathy.  Musculoskeletal: No obvious or palpable bony abnormality, slow gait with walker.erythema Skin: Warm, dry without rashes, lesions; Neuro:  Normal muscle tone, Sensation intact.  Psych: Awake and oriented X 3, normal affect, Insight and Judgment appropriate.        Assessment & Plan:    Left ear pain likely Dysfunction of left eustachian tube Swollen nostril on left side possible with recent falls, follow up with ENT -     fluticasone (FLONASE) 50 MCG/ACT nasal spray; USE TWO SPRAYS IN EACH NOSTRIL DAILY AS NEEDED  Chronic otitis externa of both ears, unspecified type -     NEOMYCIN-POLYMYXIN-HYDROCORTISONE (CORTISPORIN) 1 % SOLN OTIC solution; Place 3 drops into both ears 4 (four) times daily.  Left elbow pain -     DG Elbow Complete Right; Future - likely not fractured but will put in Xray, if worsening pain will get Xray

## 2020-03-15 ENCOUNTER — Ambulatory Visit
Admission: RE | Admit: 2020-03-15 | Discharge: 2020-03-15 | Disposition: A | Discharge status: Home / Self Care | Payer: Medicare PPO | Source: Ambulatory Visit | Attending: Physician Assistant | Admitting: Physician Assistant

## 2020-03-15 ENCOUNTER — Ambulatory Visit (INDEPENDENT_AMBULATORY_CARE_PROVIDER_SITE_OTHER): Payer: Medicare PPO | Admitting: Otolaryngology

## 2020-03-15 ENCOUNTER — Other Ambulatory Visit: Payer: Self-pay

## 2020-03-15 VITALS — Temp 97.2°F

## 2020-03-15 DIAGNOSIS — M19021 Primary osteoarthritis, right elbow: Secondary | ICD-10-CM | POA: Diagnosis not present

## 2020-03-15 DIAGNOSIS — J029 Acute pharyngitis, unspecified: Secondary | ICD-10-CM

## 2020-03-15 DIAGNOSIS — M25522 Pain in left elbow: Secondary | ICD-10-CM

## 2020-03-15 NOTE — Progress Notes (Signed)
HPI: Crystal [name] is a 69 y.o. female who presents is referred by her PCP for evaluation of sore throat on the left side.  She has had a sore throat on the left side a little over a week.  Is actually doing better today.  She has had sharp pain going to the left ear.  She has had longstanding nasal obstruction from nasal valve collapse.  She has had no mucopurulent discharge from her nose.  She complains of "sinus headaches". She is scheduled to see neurology for some chronic dizzy problems..  Past Medical History:  Diagnosis Date  . Arthritis   . ASCVD (arteriosclerotic cardiovascular disease)   . Closed nondisplaced subtrochanteric fracture of left femur ([place]) 09/03/2017   September 2018.  Status post surgery by Dr. Marcelino Scot  . Depression   . Fibromyalgia   . Gait disorder 10/26/2012  . GERD (gastroesophageal reflux disease)   . GI bleed   . Hyperlipidemia   . Hypertension   . Hypothyroid   . Periprosthetic supracondylar fracture of femur, left  03/23/2017  . Restless leg syndrome   . Rhinitis 06/25/2010  . Sleep apnea   . TIA (transient ischemic attack)    3        . Vitamin D deficiency    Past Surgical History:  Procedure Laterality Date  . ABDOMINAL HYSTERECTOMY  1992  . ANTERIOR CERVICAL DECOMPRESSION/DISCECTOMY FUSION 4 LEVELS N/A 06/14/2015   Procedure: Cervical three-four Cervical four-five Cervical five- six Cervical six- seven  Anterior cervical decompression/diskectomy/fusion;  Surgeon: Erline Levine, MD;  Location: [place] [facility];  Service: Neurosurgery;  Laterality: N/A;  C3-4 C4-5 C5-6 C6-7 Anterior cervical decompression/diskectomy/fusion  . APPENDECTOMY  1960  . Cucumber  . JOINT REPLACEMENT     2   LEFT  1 RIGHT   . KNEE SURGERY    . ORIF FEMUR FRACTURE Left 03/24/2017   Procedure: OPEN REDUCTION INTERNAL FIXATION (ORIF) FEMUR FRACTURE;  Surgeon: Altamese [place], MD;  Location: [facility];  Service: Orthopedics;  Laterality: Left;  . ROTATOR  CUFF REPAIR Left 2005   2 LEFT  1  RIGHT  . SHOULDER ADHESION RELEASE    . SPINE SURGERY    . TOE SURGERY     LEFT       . TOE SURGERY     RIGHT  BIG TOE  . TONSILLECTOMY  1960   Social History   Socioeconomic History  . Marital status: Married    Spouse name: Not on file  . Number of children: 2  . Years of education: Not on file  . Highest education level: Not on file  Occupational History  . Occupation: Retired Games developer: DISABLED  Tobacco Use  . Smoking status: Never Smoker  . Smokeless tobacco: Never Used  Vaping Use  . Vaping Use: Never used  Substance and Sexual Activity  . Alcohol use: Yes    Alcohol/week: 0.0 standard drinks    Comment: rarely  . Drug use: No  . Sexual activity: Not on file  Other Topics Concern  . Not on file  Social History Narrative   Drinks about 1 cup of coffee a day, occasional coke zero    Social Determinants of Health   Financial Resource Strain:   . Difficulty of Paying Living Expenses: Not on file  Food Insecurity:   . Worried About Charity fundraiser in the Last Year: Not on file  . Ran Out  of Food in the Last Year: Not on file  Transportation Needs:   . Lack of Transportation (Medical): Not on file  . Lack of Transportation (Non-Medical): Not on file  Physical Activity:   . Days of Exercise per Week: Not on file  . Minutes of Exercise per Session: Not on file  Stress:   . Feeling of Stress : Not on file  Social Connections:   . Frequency of Communication with Friends and Family: Not on file  . Frequency of Social Gatherings with Friends and Family: Not on file  . Attends Religious Services: Not on file  . Active Member of Clubs or Organizations: Not on file  . Attends Archivist Meetings: Not on file  . Marital Status: Not on file   Family History  Problem Relation Age of Onset  . Leukemia Sister   . Lupus Sister   . Depression Sister   . Rheum arthritis Sister   . Heart disease Father    . Hypertension Father   . Hyperlipidemia Father   . Neuropathy Father   . Cancer Mother   . Diabetes Mother   . Osteoporosis Mother   . Breast cancer Mother   . Migraines Daughter    Allergies  Allergen Reactions  . Atorvastatin Other (See Comments)    Extreme pain  . Codeine Other (See Comments)    Headache  . Pravastatin Other (See Comments)    Exreme pain  . Statins Other (See Comments)    Extreme pain  . Amitriptyline Other (See Comments)    Unspecified  . Fish Oil Nausea Only  . Hydrocodone Other (See Comments)    Itching Other Reaction: Other reaction   . Linseed Oil Other (See Comments)    Other Reaction: Other reaction  . Nuvigil [Armodafinil] Other (See Comments)    Unspecified  . Sulfa Antibiotics Other (See Comments)  . Erythromycin Other (See Comments)    Reaction unspecified  . Erythromycin Base Rash  . Flax Seed [Bio-Flax] Rash    Linseed  . Hydrocodone-Acetaminophen Rash  . Morphine Nausea And Vomiting and Rash  . Nsaids Nausea Only and Other (See Comments)    Other Reaction: Other reaction  . Sertraline Rash  . Sinemet [Carbidopa W-Levodopa] Rash  . Sulfamethoxazole Rash  . Tolmetin Nausea Only   Prior to Admission medications   Medication Sig Start Date End Date Taking? Authorizing Provider  Adalimumab 40 MG/0.4ML PNKT Inject into the skin. 11/04/18  Yes [provider]  aspirin EC 81 MG tablet Take 81 mg by mouth daily.   Yes [provider]  bisoprolol (ZEBETA) 10 MG tablet Take 1 tablet Daily for BP 01/24/20  Yes Unk Pinto, MD  buPROPion (WELLBUTRIN XL) 300 MG 24 hr tablet Take 1 tablet Daily for Mood, Focus & Concentration 04/12/19  Yes Unk Pinto, MD  Cyanocobalamin (B-12 SL) Place under the tongue daily.   Yes [provider]  diclofenac sodium (VOLTAREN) 1 % GEL Apply 2 g topically 2 (two) times daily as needed.  12/27/16  Yes [provider]  DULoxetine (CYMBALTA) 60 MG capsule Take 1 capsule  Daily for Chronic Pain 05/15/19  Yes Unk Pinto, MD  fenofibrate micronized (LOFIBRA) 134 MG capsule Take 1 tablet 1 x  /day for Triglycerides (Blood Fats) 01/09/20  Yes Unk Pinto, MD  fluconazole (DIFLUCAN) 150 MG tablet Take 150 mg by mouth See admin instructions. Three days a week for yeast.   Yes [provider]  fluticasone Asencion Islam)  50 MCG/ACT nasal spray USE TWO SPRAYS IN EACH NOSTRIL DAILY AS NEEDED 03/12/20  Yes Vicie Mutters, PA-C  gabapentin (NEURONTIN) 300 MG capsule TAKE 1 CAPSULE BY MOUTH 3 TIMES A DAY FOR CHRONIC PAIN 01/11/20  Yes [name], NP  levothyroxine (SYNTHROID) 100 MCG tablet TAKE 1 TABLET BY MOUTH ON TUES, WED, THURS, SAT AND SUN. TAKE 1& 1/2 TABLET ON MON AND FRI Patient taking differently: daily.  07/15/19  Yes [name], NP  lidocaine (XYLOCAINE) 2 % jelly apply small amount topically to affected area 2-3 times a day, knees, legs. 07/12/19  Yes McClanahan, Danton Sewer, NP  meclizine (ANTIVERT) 25 MG tablet TAKE 1 TABLET (25 MG TOTAL) BY MOUTH 3 (THREE) TIMES DAILY AS NEEDED FOR DIZZINESS. 06/13/18  Yes Unk Pinto, MD  metroNIDAZOLE (FLAGYL) 500 MG tablet Take 500 mg by mouth See admin instructions. Patient states she takes this BID daily    Yes [provider]  NEOMYCIN-POLYMYXIN-HYDROCORTISONE (CORTISPORIN) 1 % SOLN OTIC solution Place 3 drops into both ears 4 (four) times daily. 03/12/20  Yes Vicie Mutters, PA-C  oxybutynin (DITROPAN) 5 MG tablet Take 1 tablet 2 x /day for Bladder Control 01/09/20  Yes Unk Pinto, MD  pantoprazole (PROTONIX) 40 MG tablet TAKE 1 TABLET DAILY FOR INDIGESTION & ACID REFLUX 01/23/20  Yes Unk Pinto, MD  Phenylephrine-guaiFENesin (MUCUS RELIEF D PO) Take by mouth daily.   Yes [provider]  polyethylene glycol (MIRALAX / GLYCOLAX) packet Take 17 g by mouth daily as needed for mild constipation. 06/26/15  Yes Domenic Polite, MD  promethazine-dextromethorphan (PROMETHAZINE-DM)  6.25-15 MG/5ML syrup Take 1 to 2 teaspoonfuls every 4 hour if needed for cough 02/05/20  Yes Unk Pinto, MD  Respiratory Therapy Supplies (FLUTTER) DEVI Use flutter valve 3 times a day 11/24/18  Yes Martyn Ehrich, NP  rOPINIRole (REQUIP) 2 MG tablet Take 1 tablet 4 x /day for Restless Legs 12/04/18  Yes Unk Pinto, MD  tiZANidine (ZANAFLEX) 2 MG tablet TAKE 1/2 TO 1 TABLET 3 TIMES A DAY AS NEEDED FOR MUSCLE SPASMS 02/08/20  Yes Unk Pinto, MD  TRAMADOL HCL ER PO Take by mouth as needed.    Yes [provider]     Positive ROS: Otherwise negative  All other systems have been reviewed and were otherwise negative with the exception of those mentioned in the HPI and as above.  Physical Exam: Constitutional: Alert, well-appearing, no acute distress Ears: External ears without lesions or tenderness. Ear canals are clear bilaterally.  TMs are clear bilaterally.  On Dix-Hallpike testing she had no evidence of BPPV.  On hearing screening with tuning forks she hears slightly better on the left side compared to the right but minimal hearing loss with AC > BC bilaterally. Nasal: External nose without lesions. Septum midline with mild rhinitis.  Both middle meatus regions were clear with no signs of acute infection.  She does have very narrow nasal valve with easy nasal valve collapse on inhalation..  With the nasal valve held open with a nasal speculum she breathes easily through both sides. Oral: Lips and gums without lesions. Tongue and palate mucosa without lesions. Posterior oropharynx clear.  Patient is status post tonsillectomy.  Indirect laryngoscopy revealed a clear hypopharynx and larynx.  Base of tongue vallecula and epiglottis were normal.  Vocal cords were clear bilaterally.  Of note she does have some small sores on the buccal mucosa where here molar teeth come together which appear to be more traumatic and not neoplastic or  infectious.  She has a very dry mouth and mucous  membranes.  She is scheduled to see her dentist. Neck: No palpable adenopathy or masses. Respiratory: Breathing comfortably  Skin: No facial/neck lesions or rash noted.  Procedures  Assessment: Sore throat with clear upper airway examination.  No evidence of acute infection or neoplasia. Soreness may be related to the dryness in her mouth recommended drinking plenty of liquids. Nasal obstruction secondary to nasal valve collapse.  Plan: Recommended drinking plenty of liquids and using mouth lozenges for sore throat.  Reassured her of no evidence of active infection or neoplasia. For the nasal valve collapse suggested using either Maxair device which stents the nasal passages open intranasally or use of Breathe Right strips. Could possibly perform surgery to stent the nasal valve or open but would wait at least a year before considering this.   Radene Journey, MD   CC:

## 2020-03-19 ENCOUNTER — Encounter: Payer: Self-pay | Admitting: Internal Medicine

## 2020-03-19 NOTE — Patient Instructions (Signed)

## 2020-03-19 NOTE — Progress Notes (Signed)
Annual Screening/Preventative Visit & Comprehensive Evaluation &  Examination      This very nice 69 y.o.  [name] presents for a Screening /Preventative Visit & comprehensive evaluation and management of multiple medical co-morbidities.  Patient has been followed for HTN, HLD,  Diet T2_NIDDM, GERD, Crohn's Dz, Hypothyroidism, OSA/BiPAP and Vitamin D Deficiency.  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  Patient has hx/o OSA & does not use her CPAP/BiPAP due to ask intolerance. Patient has hx/o Covod 19 infection in Mar 2021.      Patient is also followed by Dr Baxter Kail, [facility] Dept of Dermatology in consideration of possible autoimmune etiology of her "mouth ulcers" due to history of Crohn's Disease.  Bx found Lichen Planus of the mouth ulcers which is being treated with Flagyl & Fluconazole  (patient has had ongoing c/o "mouth ulcers" and has been on long tern treatment with acyclovir, Diflucan & Nystatin susp, neither of which have "cured " her sx's). Patient follows with Dr Earlean Shawl for her Crohn's Disease.         Patient's last surgery was a  a Rt Reverse total shoulder joint replacement at [facility] in May 2020 (her 25th surgery).  She has a Chronic Pain Syndrome consequent of multiple Neck & back surgeries & is on Cymbalta/Gabapentin to avoid chronic Opioids.       HTN predates  circa 2006. Patient's BP has been controlled at home and patient denies any cardiac symptoms as chest pain, palpitations, shortness of breath, dizziness or ankle swelling. Today's BP is at goal -  116/64.        Patient's hyperlipidemia is controlled with diet and medications. Patient denies myalgias or other medication SE's. Last lipids were near goal but Trig's were very elevated:  Lab Results  Component Value Date   CHOL 183 11/16/2019   HDL 44 (L) 11/16/2019   LDLCALC 101 (H) 11/16/2019   TRIG 267 (H) 11/16/2019   CHOLHDL 4.2 11/16/2019       Patient has  Moderate obesity (BMI 30+)  and consequent  diet controlled T2_DM/CKD2 (GFR 75)  since 2016 (A1c 6.9%).   Patient denies reactive hypoglycemic symptoms, visual blurring, diabetic polys or paresthesias. Last A1c was not at goal:  Lab Results  Component Value Date   HGBA1C 5.9 (H) 11/16/2019       Finally, patient has history of Vitamin D Deficiency("33" /2008)and last Vitamin D was at goal:  Lab Results  Component Value Date   VD25OH 100 07/21/2019    Current Outpatient Medications on File Prior to Visit  Medication Sig  . Adalimumab 40 MG/0.4ML PNKT Inject into the skin.  [name] aspirin EC 81 MG tablet Take 81 mg by mouth daily.  . bisoprolol (ZEBETA) 10 MG tablet Take 1 tablet Daily for BP  . buPROPion (WELLBUTRIN XL) 300 MG 24 hr tablet Take 1 tablet Daily for Mood, Focus & Concentration  . Cyanocobalamin (B-12 SL) Place under the tongue daily.  . diclofenac sodium (VOLTAREN) 1 % GEL Apply 2 g topically 2 (two) times daily as needed.   . DULoxetine (CYMBALTA) 60 MG capsule Take 1 capsule Daily for Chronic Pain  . fenofibrate micronized (LOFIBRA) 134 MG capsule Take 1 tablet 1 x  /day for Triglycerides (Blood Fats)  . fluconazole (DIFLUCAN) 150 MG tablet Take 150 mg by mouth See admin instructions. Three days a week for yeast.  . fluticasone (FLONASE) 50 MCG/ACT nasal spray USE TWO SPRAYS IN [facility]  NOSTRIL DAILY AS NEEDED  . gabapentin (NEURONTIN) 300 MG capsule TAKE 1 CAPSULE BY MOUTH 3 TIMES A DAY FOR CHRONIC PAIN  . levothyroxine (SYNTHROID) 100 MCG tablet TAKE 1 TABLET BY MOUTH ON TUES, WED, THURS, SAT AND SUN. TAKE 1& 1/2 TABLET ON MON AND FRI (Patient taking differently: daily. )  . lidocaine (XYLOCAINE) 2 % jelly apply small amount topically to affected area 2-3 times a day, knees, legs.  . meclizine (ANTIVERT) 25 MG tablet TAKE 1 TABLET (25 MG TOTAL) BY MOUTH 3 (THREE) TIMES DAILY AS NEEDED FOR DIZZINESS.  [name] metroNIDAZOLE (FLAGYL) 500 MG tablet Take 500 mg by mouth See admin instructions. Patient states she  takes this BID daily   . NEOMYCIN-POLYMYXIN-HYDROCORTISONE (CORTISPORIN) 1 % SOLN OTIC solution Place 3 drops into both ears 4 (four) times daily.  [name] oxybutynin (DITROPAN) 5 MG tablet Take 1 tablet 2 x /day for Bladder Control  . pantoprazole (PROTONIX) 40 MG tablet TAKE 1 TABLET DAILY FOR INDIGESTION & ACID REFLUX  . Phenylephrine-guaiFENesin (MUCUS RELIEF D PO) Take by mouth daily.  . polyethylene glycol (MIRALAX / GLYCOLAX) packet Take 17 g by mouth daily as needed for mild constipation.  . promethazine-dextromethorphan (PROMETHAZINE-DM) 6.25-15 MG/5ML syrup Take 1 to 2 teaspoonfuls every 4 hour if needed for cough  . Respiratory Therapy Supplies (FLUTTER) DEVI Use flutter valve 3 times a day  . rOPINIRole (REQUIP) 2 MG tablet Take 1 tablet 4 x /day for Restless Legs  . tiZANidine (ZANAFLEX) 2 MG tablet TAKE 1/2 TO 1 TABLET 3 TIMES A DAY AS NEEDED FOR MUSCLE SPASMS  . TRAMADOL HCL ER PO Take by mouth as needed.    No current facility-administered medications on file prior to visit.   Allergies  Allergen Reactions  . Atorvastatin Other (See Comments)    Extreme pain  . Codeine Other (See Comments)    Headache  . Pravastatin Other (See Comments)    Exreme pain  . Statins Other (See Comments)    Extreme pain  . Amitriptyline Other (See Comments)    Unspecified  . Fish Oil Nausea Only  . Hydrocodone Other (See Comments)    Itching Other Reaction: Other reaction   . Linseed Oil Other (See Comments)    Other Reaction: Other reaction  . Nuvigil [Armodafinil] Other (See Comments)    Unspecified  . Sulfa Antibiotics Other (See Comments)  . Erythromycin Other (See Comments)    Reaction unspecified  . Erythromycin Base Rash  . Flax Seed [Bio-Flax] Rash    Linseed  . Hydrocodone-Acetaminophen Rash  . Morphine Nausea And Vomiting and Rash  . Nsaids Nausea Only and Other (See Comments)    Other Reaction: Other reaction  . Sertraline Rash  . Sinemet [Carbidopa W-Levodopa] Rash  .  Sulfamethoxazole Rash  . Tolmetin Nausea Only   Past Medical History:  Diagnosis Date  . Arthritis   . ASCVD (arteriosclerotic cardiovascular disease)   . Closed nondisplaced subtrochanteric fracture of left femur ([place]) 09/03/2017   September 2018.  Status post surgery by Dr. Marcelino Scot  . Depression   . Fibromyalgia   . Gait disorder 10/26/2012  . GERD (gastroesophageal reflux disease)   . GI bleed   . Hyperlipidemia   . Hypertension   . Hypothyroid   . Periprosthetic supracondylar fracture of femur, left  03/23/2017  . Restless leg syndrome   . Rhinitis 06/25/2010  . Sleep apnea   . TIA (transient ischemic attack)    3        .  Vitamin D deficiency    Health Maintenance  Topic Date Due  . COVID-19 Vaccine (1) Never done  . OPHTHALMOLOGY EXAM  12/10/2017  . FOOT EXAM  03/01/2020  . URINE MICROALBUMIN  03/02/2020  . HEMOGLOBIN A1C  05/18/2020  . MAMMOGRAM  05/11/2021  . TETANUS/TDAP  01/02/2025  . COLONOSCOPY  10/14/2027  . INFLUENZA VACCINE  Completed  . DEXA SCAN  Completed  . Hepatitis C Screening  Completed  . PNA vac Low Risk Adult  Completed   Immunization History  Administered Date(s) Administered  . DT (Pediatric) 01/03/2015  . Influenza Split 04/16/2018  . Influenza Whole 03/30/2010  . Influenza, High Dose Seasonal PF 03/13/2015, 07/21/2019, 02/16/2020  . Influenza-Unspecified 03/31/2011, 05/14/2014, 02/27/2016  . Pneumococcal Conjugate-13 04/17/2015, 01/23/2019  . Pneumococcal Polysaccharide-23 01/06/2017  . Pneumococcal-Unspecified 06/30/2005  . Td 06/30/2004  . Zoster 07/01/2007    Last Colon - 10/03/2017 - Dr Earlean Shawl and 10 yr  f/u due Apr 2029  Last MGM - 05/12/2019  Past Surgical History:  Procedure Laterality Date  . ABDOMINAL HYSTERECTOMY  1992  . ANTERIOR CERVICAL DECOMPRESSION/DISCECTOMY FUSION 4 LEVELS N/A 06/14/2015   Procedure: Cervical three-four Cervical four-five Cervical five- six Cervical six- seven  Anterior cervical  decompression/diskectomy/fusion;  Surgeon: Erline Levine, MD;  Location: [place] [facility]UR[facility];  Service: Neurosurgery;  Laterality: N/A;  C3-4 C4-5 C5-6 C6-7 Anterior cervical decompression/diskectomy/fusion  . APPENDECTOMY  1960  . [place]  . JOINT REPLACEMENT     2   LEFT  1 RIGHT   . KNEE SURGERY    . ORIF FEMUR FRACTURE Left 03/24/2017   Procedure: OPEN REDUCTION INTERNAL FIXATION (ORIF) FEMUR FRACTURE;  Surgeon: Altamese [place], MD;  Location: [place];  Service: Orthopedics;  Laterality: Left;  . ROTATOR CUFF REPAIR Left 2005   2 LEFT  1  RIGHT  . SHOULDER ADHESION RELEASE    . SPINE SURGERY    . TOE SURGERY     LEFT       . TOE SURGERY     RIGHT  BIG TOE  . TONSILLECTOMY  1960   Family History  Problem Relation Age of Onset  . Leukemia Sister   . Lupus Sister   . Depression Sister   . Rheum arthritis Sister   . Heart disease Father   . Hypertension Father   . Hyperlipidemia Father   . Neuropathy Father   . Cancer Mother   . Diabetes Mother   . Osteoporosis Mother   . Breast cancer Mother   . Migraines Daughter    Social History   Tobacco Use  . Smoking status: Never Smoker  . Smokeless tobacco: Never Used  Vaping Use  . Vaping Use: Never used  Substance Use Topics  . Alcohol use: Yes    Alcohol/week: 0.0 standard drinks    Comment: rarely  . Drug use: No    ROS Constitutional: Denies fever, chills, weight loss/gain, headaches, insomnia,  night sweats, and change in appetite. Does c/o fatigue. Eyes: Denies redness, blurred vision, diplopia, discharge, itchy, watery eyes.  ENT: Denies discharge, congestion, post nasal drip, epistaxis, sore throat, earache, hearing loss, dental pain, Tinnitus, Vertigo, Sinus pain, snoring.  Cardio: Denies chest pain, palpitations, irregular heartbeat, syncope, dyspnea, diaphoresis, orthopnea, PND, claudication, edema Respiratory: denies cough, dyspnea, DOE, pleurisy, hoarseness, laryngitis, wheezing.  Gastrointestinal:  Denies dysphagia, heartburn, reflux, water brash, pain, cramps, nausea, vomiting, bloating, diarrhea, constipation, hematemesis, melena, hematochezia, jaundice, hemorrhoids Genitourinary: Denies dysuria, frequency, urgency, nocturia,  hesitancy, discharge, hematuria, flank pain Breast: Breast lumps, nipple discharge, bleeding.  Musculoskeletal: Denies arthralgia, myalgia, stiffness, Jt. Swelling, pain, limp, and strain/sprain. Denies falls. Skin: Denies puritis, rash, hives, warts, acne, eczema, changing in skin lesion Neuro: No weakness, tremor, incoordination, spasms, paresthesia, pain Psychiatric: Denies confusion, memory loss, sensory loss. Denies Depression. Endocrine: Denies change in weight, skin, hair change, nocturia, and paresthesia, diabetic polys, visual blurring, hyper / hypo glycemic episodes.  Heme/Lymph: No excessive bleeding, bruising, enlarged lymph nodes.  Physical Exam  BP 116/64   Pulse 65   Temp (!) 96.6 F (35.9 C)   Ht 5' 2"  (1.575 m)   Wt 159 lb 3.2 oz (72.2 kg)   SpO2 95%   BMI 29.12 kg/m   General Appearance: Well nourished, well groomed and in no apparent distress.  Eyes: PERRLA, EOMs, conjunctiva no swelling or erythema, normal fundi and vessels. Sinuses: No frontal/maxillary tenderness ENT/Mouth: EACs patent / TMs  nl. Nares clear without erythema, swelling, mucoid exudates. Oral hygiene is good. No erythema, swelling, or exudate. Tongue normal, non-obstructing. Tonsils not swollen or erythematous. Hearing normal.  Neck: Supple, thyroid not palpable. No bruits, nodes or JVD. Respiratory: Respiratory effort normal.  BS equal and clear bilateral without rales, rhonci, wheezing or stridor. Cardio: Heart sounds are normal with regular rate and rhythm and no murmurs, rubs or gallops. Peripheral pulses are normal and equal bilaterally without edema. No aortic or femoral bruits. Chest: symmetric with normal excursions and percussion. Breasts: Symmetric, without  lumps, nipple discharge, retractions, or fibrocystic changes.  Abdomen: Flat, soft with bowel sounds active. Nontender, no guarding, rebound, hernias, masses, or organomegaly.  Lymphatics: Non tender without lymphadenopathy.  Genitourinary:  Musculoskeletal: Full ROM all peripheral extremities, joint stability, 5/5 strength, and normal gait. Skin: Warm and dry without rashes, lesions, cyanosis, clubbing or  ecchymosis.  Neuro: Cranial nerves intact, reflexes equal bilaterally. Normal muscle tone, no cerebellar symptoms. Sensation intact.  Pysch: Alert and oriented X 3, normal affect, Insight and Judgment appropriate.   Assessment and Plan  1. Annual Preventative Screening Examination   2. Essential hypertension  - EKG 12-Lead - Urinalysis, Routine w reflex microscopic - Microalbumin / creatinine urine ratio - CBC with Differential/Platelet - COMPLETE METABOLIC PANEL WITH GFR - Magnesium - TSH  3. Hyperlipidemia associated with type 2 diabetes mellitus ([place])  - EKG 12-Lead - Lipid panel - TSH  4. Type 2 diabetes mellitus with stage 2 chronic kidney disease,  without long-term current use of insulin (H[facility])  - EKG 12-Lead - Urinalysis, Routine w reflex microscopic - Microalbumin / creatinine urine ratio - HM DIABETES FOOT EXAM - LOW EXTREMITY NEUR EXAM DOCUM - Hemoglobin A1c - Insulin, random  5. Vitamin D deficiency  - VITAMIN D 25 Hydroxy   6. Crohn's disease of ileum without complication ([place])   7. Obstructive sleep apnea   8. Hypothyroidism - TSH  9. Obesity (BMI 30.0-34.9)  - TSH  10. Screening for colorectal cancer  - POC Hemoccult Bld/Stl  11. Screening for ischemic heart disease  - EKG 12-Lead  12. FHx: heart disease  - EKG 12-Lead  13. Medication management  - Urinalysis, Routine w reflex microscopic - Microalbumin / creatinine urine ratio - CBC with Differential/Platelet - COMPLETE METABOLIC PANEL WITH GFR - Magnesium - Lipid panel -  TSH - Hemoglobin A1c - Insulin, random - VITAMIN D 25 Hydroxy          Patient was counseled in prudent diet to achieve/maintain BMI less than 25  for weight control, BP monitoring, regular exercise and medications. Discussed med's effects and SE's. Screening labs and tests as requested with regular follow-up as recommended. Over 40 minutes of exam, counseling, chart review and high complex critical decision making was performed.   Kirtland Bouchard, MD

## 2020-03-20 ENCOUNTER — Ambulatory Visit (INDEPENDENT_AMBULATORY_CARE_PROVIDER_SITE_OTHER): Payer: Medicare PPO | Admitting: Internal Medicine

## 2020-03-20 ENCOUNTER — Encounter: Payer: Self-pay | Admitting: Internal Medicine

## 2020-03-20 ENCOUNTER — Other Ambulatory Visit: Payer: Self-pay

## 2020-03-20 ENCOUNTER — Ambulatory Visit: Payer: Medicare PPO | Admitting: Orthotics

## 2020-03-20 VITALS — BP 116/64 | HR 65 | Temp 96.6°F | Ht 62.0 in | Wt 159.2 lb

## 2020-03-20 DIAGNOSIS — Z8249 Family history of ischemic heart disease and other diseases of the circulatory system: Secondary | ICD-10-CM

## 2020-03-20 DIAGNOSIS — E559 Vitamin D deficiency, unspecified: Secondary | ICD-10-CM | POA: Diagnosis not present

## 2020-03-20 DIAGNOSIS — Z79899 Other long term (current) drug therapy: Secondary | ICD-10-CM

## 2020-03-20 DIAGNOSIS — Z0001 Encounter for general adult medical examination with abnormal findings: Secondary | ICD-10-CM

## 2020-03-20 DIAGNOSIS — I10 Essential (primary) hypertension: Secondary | ICD-10-CM

## 2020-03-20 DIAGNOSIS — E039 Hypothyroidism, unspecified: Secondary | ICD-10-CM | POA: Diagnosis not present

## 2020-03-20 DIAGNOSIS — E1169 Type 2 diabetes mellitus with other specified complication: Secondary | ICD-10-CM | POA: Diagnosis not present

## 2020-03-20 DIAGNOSIS — Z136 Encounter for screening for cardiovascular disorders: Secondary | ICD-10-CM | POA: Diagnosis not present

## 2020-03-20 DIAGNOSIS — N182 Chronic kidney disease, stage 2 (mild): Secondary | ICD-10-CM | POA: Diagnosis not present

## 2020-03-20 DIAGNOSIS — Z0000 Encounter for general adult medical examination without abnormal findings: Secondary | ICD-10-CM

## 2020-03-20 DIAGNOSIS — G4733 Obstructive sleep apnea (adult) (pediatric): Secondary | ICD-10-CM

## 2020-03-20 DIAGNOSIS — R2681 Unsteadiness on feet: Secondary | ICD-10-CM

## 2020-03-20 DIAGNOSIS — E669 Obesity, unspecified: Secondary | ICD-10-CM

## 2020-03-20 DIAGNOSIS — E1122 Type 2 diabetes mellitus with diabetic chronic kidney disease: Secondary | ICD-10-CM | POA: Diagnosis not present

## 2020-03-20 DIAGNOSIS — G629 Polyneuropathy, unspecified: Secondary | ICD-10-CM

## 2020-03-20 DIAGNOSIS — L84 Corns and callosities: Secondary | ICD-10-CM

## 2020-03-20 DIAGNOSIS — E785 Hyperlipidemia, unspecified: Secondary | ICD-10-CM | POA: Diagnosis not present

## 2020-03-20 DIAGNOSIS — K5000 Crohn's disease of small intestine without complications: Secondary | ICD-10-CM

## 2020-03-20 DIAGNOSIS — Z1211 Encounter for screening for malignant neoplasm of colon: Secondary | ICD-10-CM

## 2020-03-20 MED ORDER — BENZONATATE 200 MG PO CAPS: ORAL_CAPSULE | ORAL | 1 refills | Status: DC

## 2020-03-20 NOTE — Progress Notes (Signed)
Patient complained of issues wearing balance brace with both f/o she had been dispensed, and also the knee brace she wears; however she didn't bring either with her at appointment.     I suggested she get an appointment w Dr Paulla Dolly and bring everything with her nd I will consult w Dr. Alfonso Patten on what is best course of action.

## 2020-03-21 LAB — LIPID PANEL
Cholesterol: 196 mg/dL (ref <200)
HDL: 45 mg/dL — ABNORMAL LOW (ref >=50)
LDL Cholesterol (Calc): 117 mg/dL (calc) — ABNORMAL HIGH
Non-HDL Cholesterol (Calc): 151 mg/dL (calc) — ABNORMAL HIGH (ref <130)
Total CHOL/HDL Ratio: 4.4 (calc) (ref <5.0)
Triglycerides: 222 mg/dL — ABNORMAL HIGH (ref <150)

## 2020-03-21 LAB — URINALYSIS, ROUTINE W REFLEX MICROSCOPIC
Bacteria, UA: NONE SEEN /HPF
Bilirubin Urine: NEGATIVE
Glucose, UA: NEGATIVE
Hgb urine dipstick: NEGATIVE
Hyaline Cast: NONE SEEN /LPF
Ketones, ur: NEGATIVE
Nitrite: NEGATIVE
Protein, ur: NEGATIVE
Specific Gravity, Urine: 1.025 (ref 1.001–1.03)
Squamous Epithelial / HPF: NONE SEEN /HPF (ref <=5)
pH: <=5 (ref 5.0–8.0)

## 2020-03-21 LAB — CBC WITH DIFFERENTIAL/PLATELET
Absolute Monocytes: 587 cells/uL (ref 200–950)
Basophils Absolute: 57 cells/uL (ref 0–200)
Basophils Relative: 1 %
Eosinophils Absolute: 120 cells/uL (ref 15–500)
Eosinophils Relative: 2.1 %
HCT: 44.9 % (ref 35.0–45.0)
Hemoglobin: 15 g/dL (ref 11.7–15.5)
Lymphs Abs: 1493 cells/uL (ref 850–3900)
MCH: 31.3 pg (ref 27.0–33.0)
MCHC: 33.4 g/dL (ref 32.0–36.0)
MCV: 93.5 fL (ref 80.0–100.0)
MPV: 9.1 fL (ref 7.5–12.5)
Monocytes Relative: 10.3 %
Neutro Abs: 3443 cells/uL (ref 1500–7800)
Neutrophils Relative %: 60.4 %
Platelets: 274 10*3/uL (ref 140–400)
RBC: 4.8 10*6/uL (ref 3.80–5.10)
RDW: 12.3 % (ref 11.0–15.0)
Total Lymphocyte: 26.2 %
WBC: 5.7 10*3/uL (ref 3.8–10.8)

## 2020-03-21 LAB — COMPLETE METABOLIC PANEL WITH GFR
AG Ratio: 1.6 (calc) (ref 1.0–2.5)
ALT: 14 U/L (ref 6–29)
AST: 16 U/L (ref 10–35)
Albumin: 4.1 g/dL (ref 3.6–5.1)
Alkaline phosphatase (APISO): 43 U/L (ref 37–153)
BUN: 17 mg/dL (ref 7–25)
CO2: 35 mmol/L — ABNORMAL HIGH (ref 20–32)
Calcium: 9.8 mg/dL (ref 8.6–10.4)
Chloride: 100 mmol/L (ref 98–110)
Creat: 0.72 mg/dL (ref 0.60–0.93)
GFR, Est African American: 98 mL/min/{1.73_m2} (ref >=60)
GFR, Est Non African American: 84 mL/min/{1.73_m2} (ref >=60)
Globulin: 2.5 g/dL (calc) (ref 1.9–3.7)
Glucose, Bld: 85 mg/dL (ref 65–99)
Potassium: 3.8 mmol/L (ref 3.5–5.3)
Sodium: 143 mmol/L (ref 135–146)
Total Bilirubin: 0.4 mg/dL (ref 0.2–1.2)
Total Protein: 6.6 g/dL (ref 6.1–8.1)

## 2020-03-21 LAB — TSH: TSH: 1.2 mIU/L (ref 0.40–4.50)

## 2020-03-21 LAB — VITAMIN D 25 HYDROXY (VIT D DEFICIENCY, FRACTURES): Vit D, 25-Hydroxy: 60 ng/mL (ref 30–100)

## 2020-03-21 LAB — HEMOGLOBIN A1C
Hgb A1c MFr Bld: 6 % of total Hgb — ABNORMAL HIGH (ref <5.7)
Mean Plasma Glucose: 126 (calc)
eAG (mmol/L): 7 (calc)

## 2020-03-21 LAB — INSULIN, RANDOM: Insulin: 19.1 u[IU]/mL

## 2020-03-21 LAB — MAGNESIUM: Magnesium: 2.1 mg/dL (ref 1.5–2.5)

## 2020-03-21 NOTE — Progress Notes (Signed)
========================================================== - Test results slightly outside the reference range are not unusual. If there is anything important, I will review this with you,  otherwise it is considered normal test values.  If you have further questions,  please do not hesitate to contact me at the office or via My Chart.  ==========================================================  -  Total Chol = 196 -elevated   (Ideal or goal is less than 180)   - and   - Bad / Dangerous LDL Chol = 117 - also too high  (Ideal or Goal is less than 70  ! )   - Recommend a stricter  low cholesterol diet   - Cholesterol only comes from animal sources  - ie. meat, dairy, egg yolks  - Eat all the vegetables you want.  - Avoid meat, especially red meat - Beef AND Pork .  - Avoid cheese & dairy - milk & ice cream.     - Cheese is the most concentrated form of trans-fats which  is the worst thing to clog up our arteries.   - Veggie cheese is OK which can be found in the fresh  produce section at [employer] or [employer] or [employer] ==========================================================  -  Also, Triglycerides (    222  ) or fats in blood are too high  (goal is less than 150)    - Recommend avoid fried & greasy foods,  sweets / candy,   - Avoid white rice  (brown or wild rice or Quinoa is OK),   - Avoid white potatoes  (sweet potatoes are OK)   - Avoid anything made from white flour  - bagels, doughnuts, rolls, buns, biscuits, white and   wheat breads, pizza crust and traditional  pasta made of white flour & egg white  - (vegetarian pasta or spinach or wheat pasta is OK).    - Multi-grain bread is OK - like multi-grain flat bread or  sandwich thins.   - Avoid alcohol in excess.   - Exercise is also important. ==========================================================  -  A1c = 6.0%   Blood sugar and A1c are elevated in the borderline and  early or  pre-diabetes range which has the same   300% increased risk for heart attack, stroke, cancer and   alzheimer- type vascular dementia as full blown diabetes.   But the good news is that diet, exercise with  weight loss can cure the early diabetes at this point. ==========================================================  - Your blood sugar and A1c are elevated.    -Being diabetic has a  300% increased risk for heart attack,  stroke, cancer, and alzheimer- type vascular dementia.   It is very important that you work harder with diet by  avoiding all foods that are white except chicken,   fish & calliflower.  - Avoid white rice  (brown & wild rice is OK),   - Avoid white potatoes  (sweet potatoes in moderation is OK),   White bread or wheat bread or anything made out of   white flour like bagels, donuts, rolls, buns, biscuits, cakes,  - pastries, cookies, pizza crust, and pasta (made from  white flour & egg whites)   - vegetarian pasta or spinach or wheat pasta is OK.  - Multigrain breads like Arnold's, Pepper[employer] or   multigrain sandwich thins or high fiber breads like   [place] bread or "Dave's Killer" breads that are  4 to 5 grams fiber per slice !  are best.    Diet,  exercise and weight loss can reverse and cure  diabetes in the early stages.   ==========================================================  -  Vitamin D = 60 - Great  ==========================================================  -  All Else - CBC - Kidneys - Electrolytes - Liver - Magnesium & Thyroid    - all  Normal / OK ===========================================================

## 2020-03-22 ENCOUNTER — Other Ambulatory Visit: Payer: Self-pay | Admitting: Adult Health

## 2020-03-22 ENCOUNTER — Ambulatory Visit (INDEPENDENT_AMBULATORY_CARE_PROVIDER_SITE_OTHER): Payer: Medicare PPO | Admitting: Otolaryngology

## 2020-03-22 DIAGNOSIS — Z1231 Encounter for screening mammogram for malignant neoplasm of breast: Secondary | ICD-10-CM

## 2020-03-26 DIAGNOSIS — R296 Repeated falls: Secondary | ICD-10-CM | POA: Diagnosis not present

## 2020-03-26 DIAGNOSIS — R209 Unspecified disturbances of skin sensation: Secondary | ICD-10-CM | POA: Diagnosis not present

## 2020-03-26 DIAGNOSIS — G629 Polyneuropathy, unspecified: Secondary | ICD-10-CM | POA: Diagnosis not present

## 2020-03-28 DIAGNOSIS — M542 Cervicalgia: Secondary | ICD-10-CM | POA: Diagnosis not present

## 2020-03-28 DIAGNOSIS — M791 Myalgia, unspecified site: Secondary | ICD-10-CM | POA: Diagnosis not present

## 2020-03-28 DIAGNOSIS — M961 Postlaminectomy syndrome, not elsewhere classified: Secondary | ICD-10-CM | POA: Diagnosis not present

## 2020-03-28 DIAGNOSIS — M5416 Radiculopathy, lumbar region: Secondary | ICD-10-CM | POA: Diagnosis not present

## 2020-03-29 ENCOUNTER — Ambulatory Visit (INDEPENDENT_AMBULATORY_CARE_PROVIDER_SITE_OTHER): Payer: Medicare PPO | Admitting: Podiatry

## 2020-03-29 ENCOUNTER — Encounter: Payer: Self-pay | Admitting: Podiatry

## 2020-03-29 ENCOUNTER — Ambulatory Visit: Payer: Medicare PPO | Admitting: Orthotics

## 2020-03-29 ENCOUNTER — Other Ambulatory Visit: Payer: Self-pay

## 2020-03-29 DIAGNOSIS — R2681 Unsteadiness on feet: Secondary | ICD-10-CM

## 2020-03-29 DIAGNOSIS — G629 Polyneuropathy, unspecified: Secondary | ICD-10-CM | POA: Diagnosis not present

## 2020-03-29 DIAGNOSIS — M5416 Radiculopathy, lumbar region: Secondary | ICD-10-CM | POA: Diagnosis not present

## 2020-03-29 DIAGNOSIS — L84 Corns and callosities: Secondary | ICD-10-CM | POA: Diagnosis not present

## 2020-03-29 NOTE — Progress Notes (Signed)
Subjective:   Patient ID: Crystal [name], female   DOB: 69 y.o.   MRN: [id number]   HPI Patient presents with gait instability neuropathic-like discomfort and forefoot pain and is trying to wear balance bracing currently   ROS      Objective:  Physical Exam  Neurovascular status unchanged with irritation forefoot with lesion formation and irritation of her forefoot with the brace     Assessment:  Gait instability where we are attempting balance bracing but patient has difficulty in tolerating it with lesion formation     Plan:  Debrided lesions advised this patient on continued utilization of balance bracing and she is going to work with ped orthotist to make him more comfortable but it is the best we have to offer her for this condition

## 2020-03-29 NOTE — Progress Notes (Signed)
Spent 40 minutes with patient adjusting foot orthotics to go along with brace; she had slippage issues.Crystal KitchenMarland KitchenMarland [name]

## 2020-04-08 ENCOUNTER — Other Ambulatory Visit: Payer: Self-pay | Admitting: Internal Medicine

## 2020-04-11 ENCOUNTER — Other Ambulatory Visit: Payer: Self-pay | Admitting: Internal Medicine

## 2020-04-11 MED ORDER — GEMFIBROZIL 600 MG PO TABS: ORAL_TABLET | ORAL | 0 refills | Status: DC

## 2020-04-13 ENCOUNTER — Other Ambulatory Visit: Payer: Medicare PPO | Admitting: Orthotics

## 2020-04-14 ENCOUNTER — Other Ambulatory Visit: Payer: Self-pay | Admitting: Internal Medicine

## 2020-04-14 DIAGNOSIS — G2581 Restless legs syndrome: Secondary | ICD-10-CM

## 2020-04-16 ENCOUNTER — Other Ambulatory Visit: Payer: Self-pay | Admitting: Internal Medicine

## 2020-04-16 DIAGNOSIS — M48062 Spinal stenosis, lumbar region with neurogenic claudication: Secondary | ICD-10-CM | POA: Diagnosis not present

## 2020-04-16 DIAGNOSIS — M79604 Pain in right leg: Secondary | ICD-10-CM | POA: Diagnosis not present

## 2020-04-16 DIAGNOSIS — I10 Essential (primary) hypertension: Secondary | ICD-10-CM

## 2020-04-16 DIAGNOSIS — M79605 Pain in left leg: Secondary | ICD-10-CM | POA: Diagnosis not present

## 2020-04-16 DIAGNOSIS — R531 Weakness: Secondary | ICD-10-CM | POA: Diagnosis not present

## 2020-04-19 ENCOUNTER — Ambulatory Visit: Payer: Medicare PPO | Admitting: Orthotics

## 2020-04-19 ENCOUNTER — Other Ambulatory Visit: Payer: Self-pay

## 2020-04-19 DIAGNOSIS — M48062 Spinal stenosis, lumbar region with neurogenic claudication: Secondary | ICD-10-CM | POA: Diagnosis not present

## 2020-04-19 DIAGNOSIS — L84 Corns and callosities: Secondary | ICD-10-CM

## 2020-04-19 DIAGNOSIS — M79605 Pain in left leg: Secondary | ICD-10-CM | POA: Diagnosis not present

## 2020-04-19 DIAGNOSIS — R531 Weakness: Secondary | ICD-10-CM | POA: Diagnosis not present

## 2020-04-19 DIAGNOSIS — M79604 Pain in right leg: Secondary | ICD-10-CM | POA: Diagnosis not present

## 2020-04-19 DIAGNOSIS — R2681 Unsteadiness on feet: Secondary | ICD-10-CM

## 2020-04-19 NOTE — Progress Notes (Signed)
Discussed options on brace, and told her that was the best option that Dr. Paulla Dolly recommended.  Told her to talk to vicki about a payment plan

## 2020-04-23 ENCOUNTER — Emergency Department (HOSPITAL_COMMUNITY): Payer: Medicare PPO

## 2020-04-23 ENCOUNTER — Emergency Department (HOSPITAL_COMMUNITY)
Admission: EM | Admit: 2020-04-23 | Discharge: 2020-04-23 | Disposition: A | Discharge status: Home / Self Care | Payer: Medicare PPO | Attending: Emergency Medicine | Admitting: Emergency Medicine

## 2020-04-23 ENCOUNTER — Other Ambulatory Visit: Payer: Self-pay

## 2020-04-23 DIAGNOSIS — M1611 Unilateral primary osteoarthritis, right hip: Secondary | ICD-10-CM | POA: Diagnosis not present

## 2020-04-23 DIAGNOSIS — S7002XA Contusion of left hip, initial encounter: Secondary | ICD-10-CM | POA: Insufficient documentation

## 2020-04-23 DIAGNOSIS — Y92018 Other place in single-family (private) house as the place of occurrence of the external cause: Secondary | ICD-10-CM | POA: Diagnosis not present

## 2020-04-23 DIAGNOSIS — E1122 Type 2 diabetes mellitus with diabetic chronic kidney disease: Secondary | ICD-10-CM | POA: Diagnosis not present

## 2020-04-23 DIAGNOSIS — S99921A Unspecified injury of right foot, initial encounter: Secondary | ICD-10-CM | POA: Diagnosis present

## 2020-04-23 DIAGNOSIS — N182 Chronic kidney disease, stage 2 (mild): Secondary | ICD-10-CM | POA: Insufficient documentation

## 2020-04-23 DIAGNOSIS — Z881 Allergy status to other antibiotic agents status: Secondary | ICD-10-CM | POA: Diagnosis not present

## 2020-04-23 DIAGNOSIS — S7001XA Contusion of right hip, initial encounter: Secondary | ICD-10-CM | POA: Insufficient documentation

## 2020-04-23 DIAGNOSIS — M19071 Primary osteoarthritis, right ankle and foot: Secondary | ICD-10-CM | POA: Diagnosis not present

## 2020-04-23 DIAGNOSIS — S92354A Nondisplaced fracture of fifth metatarsal bone, right foot, initial encounter for closed fracture: Secondary | ICD-10-CM | POA: Insufficient documentation

## 2020-04-23 DIAGNOSIS — E039 Hypothyroidism, unspecified: Secondary | ICD-10-CM | POA: Insufficient documentation

## 2020-04-23 DIAGNOSIS — I129 Hypertensive chronic kidney disease with stage 1 through stage 4 chronic kidney disease, or unspecified chronic kidney disease: Secondary | ICD-10-CM | POA: Diagnosis not present

## 2020-04-23 DIAGNOSIS — Z79899 Other long term (current) drug therapy: Secondary | ICD-10-CM | POA: Diagnosis not present

## 2020-04-23 DIAGNOSIS — S92351A Displaced fracture of fifth metatarsal bone, right foot, initial encounter for closed fracture: Secondary | ICD-10-CM

## 2020-04-23 DIAGNOSIS — W010XXA Fall on same level from slipping, tripping and stumbling without subsequent striking against object, initial encounter: Secondary | ICD-10-CM | POA: Diagnosis not present

## 2020-04-23 DIAGNOSIS — Z8616 Personal history of COVID-19: Secondary | ICD-10-CM | POA: Diagnosis not present

## 2020-04-23 DIAGNOSIS — R0781 Pleurodynia: Secondary | ICD-10-CM | POA: Diagnosis not present

## 2020-04-23 MED ORDER — ACETAMINOPHEN 500 MG PO TABS
1000.0000 mg | ORAL_TABLET | Freq: Once | ORAL | Status: AC
  Administered 2020-04-23: 1000 mg via ORAL
  Filled 2020-04-23: qty 2

## 2020-04-23 MED ORDER — DICLOFENAC SODIUM 1 % EX GEL: 4.0000 g | Freq: Four times a day (QID) | CUTANEOUS | 0 refills | Status: DC

## 2020-04-23 NOTE — ED Triage Notes (Signed)
Pt reports falling on her way back in the house while trying to get a package off the front porch without the use of her walker. She reports falling onto her right side. Her right foot hurts the worst, but she also hurts in her right hip. Large contusion on her right hip. Left hip pain as well with 2 small bruises on the left side. Hx of falls in June.

## 2020-04-23 NOTE — ED Provider Notes (Signed)
[facility] DEPT Provider Note   CSN: [id number] Arrival date & time: 04/23/20  9147     History Chief Complaint  Patient presents with  . Fall  . Foot Pain  . Hip Pain    Crystal [name] is a 69 y.o. female.  69 yo F with a chief complaints of a fall.  Patient states that she had walked into her house after picking up a package and she had 1 foot on the carpet and 1 not and she slipped and landed on her right side.  Is the second fall in a couple days.  Complaining of pain mostly to the right foot but also to the right lateral hip and the left side.  She denies head injury denies loss consciousness.  She has chronic gait instability that requires her to use a walker but she is not using it during either fall.  The history is provided by the patient.  Fall This is a new problem. The current episode started yesterday. The problem occurs constantly. The problem has not changed since onset.Pertinent negatives include no chest pain, no headaches and no shortness of breath. Nothing aggravates the symptoms. Nothing relieves the symptoms. She has tried nothing for the symptoms. The treatment provided no relief.  Foot Pain Pertinent negatives include no chest pain, no headaches and no shortness of breath.  Hip Pain Pertinent negatives include no chest pain, no headaches and no shortness of breath.       Past Medical History:  Diagnosis Date  . Arthritis   . ASCVD (arteriosclerotic cardiovascular disease)   . Closed nondisplaced subtrochanteric fracture of left femur ([place]) 09/03/2017   September 2018.  Status post surgery by Dr. Marcelino Scot  . Depression   . Fibromyalgia   . Gait disorder 10/26/2012  . GERD (gastroesophageal reflux disease)   . GI bleed   . Hyperlipidemia   . Hypertension   . Hypothyroid   . Periprosthetic supracondylar fracture of femur, left  03/23/2017  . Restless leg syndrome   . Rhinitis 06/25/2010  . Sleep apnea   . TIA  (transient ischemic attack)    3        . Vitamin D deficiency     Patient Active Problem List   Diagnosis Date Noted  . CKD stage 2 due to type 2 diabetes mellitus ([place]) 11/16/2019  . COVID-19 virus infection 10/19/2019  . History of recurrent TIAs 11/15/2018  . Rupture of popliteal cyst of right knee region (complex cyst) 11/03/2018  . Diet-controlled type 2 diabetes mellitus ([place]) 07/04/2018  . History of adenomatous polyp of colon 10/18/2017  . Bronchiectasis without complication ([place]) 82/95/6213  . Diverticulosis 10/12/2017  . Internal hemorrhoids 10/12/2017  . Polypharmacy 04/09/2017  . Arthritis of carpometacarpal (CMC) joints of both thumbs 02/12/2017  . Cervical disc disease 01/23/2017  . Constipation 01/23/2017  . Crohn's ileitis ([place]) 01/23/2017  . S/P revision of total knee, left 12/18/2016  . B12 deficiency anemia 06/16/2016  . Sleep talking 03/26/2016  . IBS (irritable colon syndrome) 08/09/2015  . Spinal stenosis in cervical region 06/14/2015  . Other specified arthropathy involving shoulder region 11/23/2014  . Obesity (BMI 30.0-34.9) 10/31/2014  . Medication management 10/25/2013  . Rotator cuff arthropathy 09/15/2013  . Hyperlipidemia associated with type 2 diabetes mellitus ([place])   . Hypothyroid   . Depression, major, recurrent, in partial remission ([place])   . Vitamin D deficiency   . ASCVD   . Gait disorder 10/26/2012  .  Knee pain 09/07/2012  . Rotator cuff tear 08/23/2012  . Bilateral bunions 11/19/2011  . Rhinitis 06/25/2010  . Obstructive sleep apnea 06/21/2010  . RESTLESS LEG SYNDROME 06/21/2010  . Essential hypertension 06/21/2010  . GERD 06/21/2010  . Crohn's Enterocolitis / Regional enteritis 06/21/2010  . Fibromyalgia 06/21/2010    Past Surgical History:  Procedure Laterality Date  . ABDOMINAL HYSTERECTOMY  1992  . ANTERIOR CERVICAL DECOMPRESSION/DISCECTOMY FUSION 4 LEVELS N/A 06/14/2015   Procedure: Cervical three-four Cervical four-five  Cervical five- six Cervical six- seven  Anterior cervical decompression/diskectomy/fusion;  Surgeon: Erline Levine, MD;  Location: [facility];  Service: Neurosurgery;  Laterality: N/A;  C3-4 C4-5 C5-6 C6-7 Anterior cervical decompression/diskectomy/fusion  . APPENDECTOMY  1960  . [place]  . JOINT REPLACEMENT     2   LEFT  1 RIGHT   . KNEE SURGERY    . ORIF FEMUR FRACTURE Left 03/24/2017   Procedure: OPEN REDUCTION INTERNAL FIXATION (ORIF) FEMUR FRACTURE;  Surgeon: Altamese [place], MD;  Location: [facility];  Service: Orthopedics;  Laterality: Left;  . ROTATOR CUFF REPAIR Left 2005   2 LEFT  1  RIGHT  . SHOULDER ADHESION RELEASE    . SPINE SURGERY    . TOE SURGERY     LEFT       . TOE SURGERY     RIGHT  BIG TOE  . TONSILLECTOMY  1960     OB History   No obstetric history on file.     Family History  Problem Relation Age of Onset  . Leukemia Sister   . Lupus Sister   . Depression Sister   . Rheum arthritis Sister   . Heart disease Father   . Hypertension Father   . Hyperlipidemia Father   . Neuropathy Father   . Cancer Mother   . Diabetes Mother   . Osteoporosis Mother   . Breast cancer Mother   . Migraines Daughter     Social History   Tobacco Use  . Smoking status: Never Smoker  . Smokeless tobacco: Never Used  Vaping Use  . Vaping Use: Never used  Substance Use Topics  . Alcohol use: Yes    Alcohol/week: 0.0 standard drinks    Comment: rarely  . Drug use: No    Home Medications Prior to Admission medications   Medication Sig Start Date End Date Taking? Authorizing Provider  acyclovir (ZOVIRAX) 400 MG tablet Take 400 mg by mouth as needed (flare ups).  12/08/19  Yes [provider]  Adalimumab 40 MG/0.4ML PNKT Inject 40 mg into the skin every 14 (fourteen) days.  11/04/18  Yes [provider]  ascorbic acid (VITAMIN C) 100 MG tablet Take 100 mg by mouth daily. "Gummy"   Yes [provider]  benzonatate (TESSALON) 200  MG capsule Take 1 perle 3 x / day to prevent cough Patient taking differently: Take 200 mg by mouth 3 (three) times daily as needed for cough.  03/20/20  Yes Unk Pinto, MD  bisoprolol (ZEBETA) 10 MG tablet Take    1 tablet    Daily       for BP Patient taking differently: Take 10 mg by mouth daily.  04/16/20  Yes Unk Pinto, MD  buPROPion (WELLBUTRIN XL) 300 MG 24 hr tablet Take    1 tablet    Daily     for Mood, Focus & Concentration Patient taking differently: Take 300 mg by mouth daily.  04/08/20  Yes  Unk Pinto, MD  calcium carbonate (OS-CAL) 1250 (500 Ca) MG chewable tablet Chew 1 tablet by mouth daily.   Yes [provider]  Cyanocobalamin (B-12 SL) Place 1 each under the tongue daily.    Yes [provider]  dexamethasone 0.5 MG/5ML elixir Take 0.5 mg by mouth 5 (five) times daily as needed (tooth infections).  11/02/19  Yes [provider]  DIGESTIVE ENZYMES PO Take 1 each by mouth daily.   Yes [provider]  DULoxetine (CYMBALTA) 60 MG capsule Take 1 capsule Daily for Chronic Pain Patient taking differently: Take 60 mg by mouth daily.  05/15/19  Yes Unk Pinto, MD  famotidine (PEPCID) 40 MG tablet Take 40 mg by mouth daily. 02/06/20  Yes [provider]  fenofibrate micronized (LOFIBRA) 134 MG capsule Take 1 tablet 1 x  /day for Triglycerides (Blood Fats) Patient taking differently: Take 134 mg by mouth daily before breakfast.  01/09/20  Yes Unk Pinto, MD  fluconazole (DIFLUCAN) 150 MG tablet Take 150 mg by mouth See admin instructions. Sundays and Thursday   Yes [provider]  fluticasone (FLONASE) 50 MCG/ACT nasal spray USE TWO SPRAYS IN EACH NOSTRIL DAILY AS NEEDED Patient taking differently: Place 2 sprays into both nostrils daily as needed for allergies.  03/12/20  Yes Vicie Mutters R, PA-C  gabapentin (NEURONTIN) 300 MG capsule TAKE 1 CAPSULE BY MOUTH 3 TIMES A DAY FOR CHRONIC PAIN Patient taking  differently: Take 300 mg by mouth 3 (three) times daily.  01/11/20  Yes [name], NP  gemfibrozil (LOPID) 600 MG tablet Take     1 tablet     2 x /day     with Meals      for Cholesterol & Triglycerides Patient taking differently: Take 600 mg by mouth 2 (two) times daily before a meal.  04/11/20  Yes Unk Pinto, MD  levothyroxine (SYNTHROID) 100 MCG tablet TAKE 1 TABLET BY MOUTH ON TUES, WED, THURS, SAT AND SUN. TAKE 1& 1/2 TABLET ON MON AND FRI Patient taking differently: Take 100 mcg by mouth daily.  07/15/19  Yes [name], NP  meclizine (ANTIVERT) 25 MG tablet TAKE 1 TABLET (25 MG TOTAL) BY MOUTH 3 (THREE) TIMES DAILY AS NEEDED FOR DIZZINESS. 06/13/18  Yes Unk Pinto, MD  metroNIDAZOLE (FLAGYL) 500 MG tablet Take 500 mg by mouth in the morning and at bedtime.    Yes [provider]  NEOMYCIN-POLYMYXIN-HYDROCORTISONE (CORTISPORIN) 1 % SOLN OTIC solution Place 3 drops into both ears 4 (four) times daily. Patient taking differently: Place 3 drops into both ears 4 (four) times daily as needed (ear infection).  03/12/20  Yes Vicie Mutters R, PA-C  nystatin (MYCOSTATIN) 100000 UNIT/ML suspension Take 15 mLs by mouth daily as needed (tooth infections).  02/06/20  Yes [provider]  oxybutynin (DITROPAN) 5 MG tablet Take 1 tablet 2 x /day for Bladder Control Patient taking differently: Take 5 mg by mouth daily.  01/09/20  Yes Unk Pinto, MD  polyethylene glycol [facility] Floria Raveling) packet Take 17 g by mouth daily as needed for mild constipation. Patient taking differently: Take 17 g by mouth daily. May take additional dose to equal 34g a day 06/26/15  Yes Domenic Polite, MD  promethazine-dextromethorphan (PROMETHAZINE-DM) 6.25-15 MG/5ML syrup Take 1 to 2 teaspoonfuls every 4 hour if needed for cough 02/05/20  Yes Unk Pinto, MD  rOPINIRole (REQUIP) 2 MG tablet TAKE 1 TABLET FOUR TIMES A DAY FOR RESTLESS LEGS Patient taking differently: Take  2 mg by  mouth in the morning, at noon, in the evening, and at bedtime.  04/14/20  Yes Unk Pinto, MD  tiZANidine (ZANAFLEX) 2 MG tablet TAKE 1/2 TO 1 TABLET 3 TIMES A DAY AS NEEDED FOR MUSCLE SPASMS Patient taking differently: Take 2 mg by mouth at bedtime.  02/08/20  Yes Unk Pinto, MD  traMADol (ULTRAM) 50 MG tablet Take 50 mg by mouth 4 (four) times daily as needed for moderate pain. 03/28/20  Yes [provider]  diclofenac Sodium (VOLTAREN) 1 % GEL Apply 4 g topically 4 (four) times daily. 04/23/20   Deno Etienne, DO  Respiratory Therapy Supplies (FLUTTER) DEVI Use flutter valve 3 times a day 11/24/18   Martyn Ehrich, NP    Allergies    Atorvastatin, Codeine, Pravastatin, Statins, Amitriptyline, Fish oil, Hydrocodone, Linseed oil, Nuvigil [armodafinil], Sulfa antibiotics, Erythromycin, Erythromycin base, Flax seed [bio-flax], Hydrocodone-acetaminophen, Morphine, Nsaids, Sertraline, Sinemet [carbidopa w-levodopa], Sulfamethoxazole, and Tolmetin  Review of Systems   Review of Systems  Constitutional: Negative for chills and fever.  HENT: Negative for congestion and rhinorrhea.   Eyes: Negative for redness and visual disturbance.  Respiratory: Negative for shortness of breath and wheezing.   Cardiovascular: Negative for chest pain and palpitations.  Gastrointestinal: Negative for nausea and vomiting.  Genitourinary: Negative for dysuria and urgency.  Musculoskeletal: Positive for arthralgias, gait problem and myalgias.  Skin: Negative for pallor and wound.  Neurological: Negative for dizziness and headaches.    Physical Exam Updated Vital Signs BP 121/70 (BP Location: Right Arm)   Pulse 74   Temp 97.6 F (36.4 C) (Oral)   Resp 16   Ht 5' 2"  (1.575 m)   Wt 76.2 kg   SpO2 99%   BMI 30.73 kg/m   Physical Exam Vitals and nursing note reviewed.  Constitutional:      General: She is not in acute distress.    Appearance: She is well-developed. She is not diaphoretic.   HENT:     Head: Normocephalic and atraumatic.  Eyes:     Pupils: Pupils are equal, round, and reactive to light.  Cardiovascular:     Rate and Rhythm: Normal rate and regular rhythm.     Heart sounds: No murmur heard.  No friction rub. No gallop.   Pulmonary:     Effort: Pulmonary effort is normal.     Breath sounds: No wheezing or rales.  Abdominal:     General: There is no distension.     Palpations: Abdomen is soft.     Tenderness: There is no abdominal tenderness.  Musculoskeletal:        General: Swelling and tenderness present.     Cervical back: Normal range of motion and neck supple.     Comments: Tenderness and swelling to the right lateral aspect of the foot.  No specific tenderness about the base of the fifth metatarsal.  No pain at the navicular.  Pulse motor and sensation are intact.  No pain with internal or external rotation of the right lower leg.  No pain along the greater trochanter.  She has hematoma about 4 cm distal to the trochanter.  No femur instability no swelling.  She has a bruise just above the left iliac crest.  No pain to the midline spine.  No step-offs or deformities.  Palpated from head to toe without any other noted areas of bony tenderness.  Skin:    General: Skin is warm and dry.  Neurological:  Mental Status: She is alert and oriented to person, place, and time.  Psychiatric:        Behavior: Behavior normal.     ED Results / Procedures / Treatments   Labs (all labs ordered are listed, but only abnormal results are displayed) Labs Reviewed - No data to display  EKG None  Radiology DG Ribs Unilateral W/Chest Left  Result Date: 04/23/2020 CLINICAL DATA:  Recent fall with pain EXAM: LEFT RIBS AND CHEST - 3+ VIEW COMPARISON:  Chest radiograph January 18, 2020. FINDINGS: Frontal chest as well as oblique and cone-down rib images were obtained. Lungs are clear. Heart size and pulmonary vascularity are normal. No adenopathy. There is mild  elevation of the right hemidiaphragm. Evidence of old trauma with remodeling in each lateral clavicle. Total shoulder replacement noted on the right. Postoperative change lower cervical spine. No pneumothorax or pleural effusion.  No appreciable rib fracture. IMPRESSION: No acute rib fracture. Evidence of prior old trauma each lateral clavicle. Areas of postoperative change in the lower cervical spine and right shoulder. Lungs clear. Heart size normal. Electronically Signed   By: Lowella Grip III M.D.   On: 04/23/2020 11:10   DG Foot Complete Right  Result Date: 04/23/2020 CLINICAL DATA:  Right foot pain EXAM: RIGHT FOOT COMPLETE - 3+ VIEW COMPARISON:  11/25/2019 FINDINGS: Acute nondisplaced fracture of the fifth metatarsal base. There appears to be intra-articular extension to the fifth TMT joint. Surgical hardware in the distal first metatarsal and great toe proximal phalanx without evidence of hardware complication. Unchanged mild hallux valgus with moderate arthropathy of the first MTP joint. Severe degenerative changes at the second MTP joint. There is chronic ankylosis of the proximal interphalangeal joints of the second and third toes, unchanged. Degenerative changes at the anterior tibiotalar joint. Small plantar calcaneal spur. Soft tissues within normal limits. IMPRESSION: 1. Acute nondisplaced avulsion type fracture of the fifth metatarsal base. 2. Chronic degenerative changes of the right foot, similar to prior study. Electronically Signed   By: Davina Poke D.O.   On: 04/23/2020 11:11   DG Hip Unilat W or Wo Pelvis 2-3 Views Right  Result Date: 04/23/2020 CLINICAL DATA:  Pain following fall EXAM: DG HIP (WITH OR WITHOUT PELVIS) 2-3V RIGHT COMPARISON:  December 19, 2019 FINDINGS: Frontal pelvis as well as frontal and lateral right hip joint images obtained. No fracture or dislocation. There is narrowing of each hip joint, slightly more severe on the right than on the left, stable. There is  bony overgrowth along each superolateral acetabulum. Sacroiliac joints appear normal. Postoperative change noted in the lower lumbar spine. IMPRESSION: Osteoarthritic change in each hip joint, slightly more severe on the right than on the left, stable. Bony overgrowth along each superolateral acetabulum potentially places patient at increased risk for femoroacetabular impingement. No acute fracture or dislocation. Postoperative change in lower lumbar region noted. Electronically Signed   By: Lowella Grip III M.D.   On: 04/23/2020 11:08    Procedures Procedures (including critical care time)  Medications Ordered in ED Medications  acetaminophen (TYLENOL) tablet 1,000 mg (1,000 mg Oral Given 04/23/20 1003)    ED Course  I have reviewed the triage vital signs and the nursing notes.  Pertinent labs & imaging results that were available during my care of the patient were reviewed by me and considered in my medical decision making (see chart for details).    MDM Rules/Calculators/A&P  69 yo F with a chief complaint of a fall.  Patient unfortunately has a history of chronic gait instability and she was not using her walker and lost balance.  She denies head injury denies loss of consciousness.  Denies neck pain chest pain.  Patient appears to be quite well and in no significant discomfort.  Will obtain a plain film of the right foot right hip and left chest wall.  Reassess.    Patient with a proximal right fifth metatarsal fracture is viewed by me.  Will place in a postop shoe.  Have her follow-up with orthopedics.  She currently sees 2 different foot and ankle offices and an orthopedic surgeon.  12:13 PM:  I have discussed the diagnosis/risks/treatment options with the patient and believe the pt to be eligible for discharge home to follow-up with PCP. We also discussed returning to the ED immediately if new or worsening sx occur. We discussed the sx which are most  concerning (e.g., sudden worsening pain, fever, inability to tolerate by mouth) that necessitate immediate return. Medications administered to the patient during their visit and any new prescriptions provided to the patient are listed below.  Medications given during this visit Medications  acetaminophen (TYLENOL) tablet 1,000 mg (1,000 mg Oral Given 04/23/20 1003)     The patient appears reasonably screen and/or stabilized for discharge and I doubt any other medical condition or other [facility] requiring further screening, evaluation, or treatment in the ED at this time prior to discharge.   Final Clinical Impression(s) / ED Diagnoses Final diagnoses:  Closed fracture of base of fifth metatarsal bone of right foot    Rx / DC Orders ED Discharge Orders         Ordered    diclofenac Sodium (VOLTAREN) 1 % GEL  4 times daily        04/23/20 Golden, [place], DO 04/23/20 1213

## 2020-04-23 NOTE — Discharge Instructions (Addendum)
Follow up with your ortho doc in the office.  Return for worsening pain.

## 2020-04-26 ENCOUNTER — Other Ambulatory Visit: Payer: Self-pay | Admitting: Internal Medicine

## 2020-04-27 DIAGNOSIS — L281 Prurigo nodularis: Secondary | ICD-10-CM | POA: Diagnosis not present

## 2020-04-27 DIAGNOSIS — B379 Candidiasis, unspecified: Secondary | ICD-10-CM | POA: Diagnosis not present

## 2020-04-27 DIAGNOSIS — L438 Other lichen planus: Secondary | ICD-10-CM | POA: Diagnosis not present

## 2020-04-30 DIAGNOSIS — Z6829 Body mass index (BMI) 29.0-29.9, adult: Secondary | ICD-10-CM | POA: Diagnosis not present

## 2020-04-30 DIAGNOSIS — S92354A Nondisplaced fracture of fifth metatarsal bone, right foot, initial encounter for closed fracture: Secondary | ICD-10-CM | POA: Diagnosis not present

## 2020-04-30 DIAGNOSIS — M25571 Pain in right ankle and joints of right foot: Secondary | ICD-10-CM | POA: Diagnosis not present

## 2020-04-30 DIAGNOSIS — M5416 Radiculopathy, lumbar region: Secondary | ICD-10-CM | POA: Diagnosis not present

## 2020-04-30 DIAGNOSIS — I10 Essential (primary) hypertension: Secondary | ICD-10-CM | POA: Diagnosis not present

## 2020-05-01 ENCOUNTER — Ambulatory Visit (INDEPENDENT_AMBULATORY_CARE_PROVIDER_SITE_OTHER): Payer: Medicare PPO | Admitting: Adult Health Nurse Practitioner

## 2020-05-01 ENCOUNTER — Other Ambulatory Visit: Payer: Self-pay

## 2020-05-01 ENCOUNTER — Encounter: Payer: Self-pay | Admitting: Adult Health Nurse Practitioner

## 2020-05-01 VITALS — BP 124/86 | HR 74 | Temp 97.3°F | Ht 62.0 in | Wt 158.5 lb

## 2020-05-01 DIAGNOSIS — R58 Hemorrhage, not elsewhere classified: Secondary | ICD-10-CM

## 2020-05-01 DIAGNOSIS — D6804 Acquired von Willebrand disease: Secondary | ICD-10-CM

## 2020-05-01 DIAGNOSIS — D680 Von Willebrand's disease: Secondary | ICD-10-CM | POA: Diagnosis not present

## 2020-05-01 DIAGNOSIS — W19XXXD Unspecified fall, subsequent encounter: Secondary | ICD-10-CM

## 2020-05-01 DIAGNOSIS — S62346D Nondisplaced fracture of base of fifth metacarpal bone, right hand, subsequent encounter for fracture with routine healing: Secondary | ICD-10-CM

## 2020-05-01 DIAGNOSIS — T148XXA Other injury of unspecified body region, initial encounter: Secondary | ICD-10-CM | POA: Diagnosis not present

## 2020-05-01 DIAGNOSIS — I10 Essential (primary) hypertension: Secondary | ICD-10-CM

## 2020-05-01 NOTE — Progress Notes (Signed)
Assessment and Plan:  Crystal [name] was seen today for fall.  Diagnoses and all orders for this visit:  Fall, subsequent encounter -     CBC with Differential/Platelet -     Protime-INR -     Von Willebrand Antigen Discussed safety at length with patient  Ecchymosis Hematoma Discussed continue to monitor area. Concern for degree of bruising without antiplatelet use? -     CBC with Differential/Platelet -     Protime-INR -     Von Willebrand Antigen  Closed nondisplaced fracture of base of fifth metacarpal bone of right hand with routine healing, subsequent encounter Continue follow up with Ortho Discussed safety at length with patient   Essential hypertension Continue current medications Monitor blood pressure at home; call if consistently over 130/80 Continue DASH diet.   Reminder to go to the ER if any CP, SOB, nausea, dizziness, severe HA, changes vision/speech, left arm numbness and tingling and jaw pain.     Further disposition pending results of labs. Discussed med's effects and SE's.   Over 30 minutes of face to face interview, exam, counseling, chart review, and critical decision making was performed.   Future Appointments  Date Time Provider Department Center  05/02/2020  1:45 PM Lenn Sink, DPM [facility]  05/14/2020  1:15 PM Ria Clock [facility]  06/20/2020 11:30 AM Judd Gaudier, NP [facility] None  07/09/2020 11:30 AM [facility] MM 3 [facility]  07/09/2020 12:00 PM [facility] DX DEXA 1 [facility]  04/08/2021 10:00 AM [name], MD [facility] None    ------------------------------------------------------------------------------------------------------------------    HPI 69 y.o.female presents for evaluation of hip pain s/p fall..  On 10/24 she had evaluation in ED resulting in fracture.  Fell and fractured her right foot and went to the hospital for this.  She is wearing a boot today on right foot.   Crystal [name] [facility] for right foot fracture Xray on 10/25, non-displaced fraacture.  Hard soled shoee/post-op shoes.  Four week follow up repeat xrays.  She is also wearing ankle brace on left.  Neurosurgery [facility] Sp[facility] 04/30/20.  EMG's exam benign.  On 10/27 she fell when she was walking and fell inside the house. She recalls there was a indentation in the drywall.  She has bruising on left side of abdomen from left hip to flank.  On her right leg she reports tenderness to the lateral surface of thigh.  Past Medical History:  Diagnosis Date  . Arthritis   . ASCVD (arteriosclerotic cardiovascular disease)   . Closed nondisplaced subtrochanteric fracture of left femur (HCC) 09/03/2017   September 2018.  Status post surgery by Dr. Carola Frost  . Depression   . Fibromyalgia   . Gait disorder 10/26/2012  . GERD (gastroesophageal reflux disease)   . GI bleed   . Hyperlipidemia   . Hypertension   . Hypothyroid   . Periprosthetic supracondylar fracture of femur, left  03/23/2017  . Restless leg syndrome   . Rhinitis 06/25/2010  . Sleep apnea   . TIA (transient ischemic attack)    3        . Vitamin D deficiency      Allergies  Allergen Reactions  . Atorvastatin Other (See Comments)    Extreme pain  . Codeine Other (See Comments)    Headache  . Pravastatin Other (See Comments)    Exreme pain  . Statins Other (See Comments)    Extreme pain  . Amitriptyline Other (See Comments)  Unspecified  . Fish Oil Nausea Only  . Hydrocodone Other (See Comments)    Itching Other Reaction: Other reaction   . Linseed Oil Other (See Comments)    Other Reaction: Other reaction  . Nuvigil [Armodafinil] Other (See Comments)    Unspecified  . Sulfa Antibiotics Other (See Comments)  . Erythromycin Other (See Comments)    Reaction unspecified  . Erythromycin Base Rash  . Flax Seed [Bio-Flax] Rash    Linseed  . Hydrocodone-Acetaminophen Rash  . Morphine Nausea And Vomiting and Rash  . Nsaids Nausea Only and  Other (See Comments)    Other Reaction: Other reaction  . Sertraline Rash  . Sinemet [Carbidopa W-Levodopa] Rash  . Sulfamethoxazole Rash  . Tolmetin Nausea Only    Current Outpatient Medications on File Prior to Visit  Medication Sig  . acyclovir (ZOVIRAX) 400 MG tablet Take 400 mg by mouth as needed (flare ups).   . Adalimumab 40 MG/0.4ML PNKT Inject 40 mg into the skin every 14 (fourteen) days.   [name] ascorbic acid (VITAMIN C) 100 MG tablet Take 100 mg by mouth daily. "Gummy"  . benzonatate (TESSALON) 200 MG capsule Take 1 perle 3 x / day to prevent cough (Patient taking differently: Take 200 mg by mouth 3 (three) times daily as needed for cough. )  . bisoprolol (ZEBETA) 10 MG tablet Take    1 tablet    Daily       for BP (Patient taking differently: Take 10 mg by mouth daily. )  . budesonide (ENTOCORT EC) 3 MG 24 hr capsule Take by mouth.  [name] buPROPion (WELLBUTRIN XL) 300 MG 24 hr tablet Take    1 tablet    Daily     for Mood, Focus & Concentration (Patient taking differently: Take 300 mg by mouth daily. )  . calcium carbonate (OS-CAL) 1250 (500 Ca) MG chewable tablet Chew 1 tablet by mouth daily.  . Cyanocobalamin (B-12 SL) Place 1 each under the tongue daily.   [name] dexamethasone 0.5 MG/5ML elixir Take 0.5 mg by mouth 5 (five) times daily as needed (tooth infections).   . diclofenac Sodium (VOLTAREN) 1 % GEL Apply 4 g topically 4 (four) times daily.  [name] DIGESTIVE ENZYMES PO Take 1 each by mouth daily.  . DULoxetine (CYMBALTA) 60 MG capsule Take 1 capsule Daily for Chronic Pain (Patient taking differently: Take 60 mg by mouth daily. )  . famotidine (PEPCID) 40 MG tablet Take 40 mg by mouth daily.  . fenofibrate micronized (LOFIBRA) 134 MG capsule Take 1 tablet 1 x  /day for Triglycerides (Blood Fats) (Patient taking differently: Take 134 mg by mouth daily before breakfast. )  . fluconazole (DIFLUCAN) 150 MG tablet Take 150 mg by mouth See admin instructions. Sundays and Thursday  . fluticasone  (FLONASE) 50 MCG/ACT nasal spray USE TWO SPRAYS IN EACH NOSTRIL DAILY AS NEEDED (Patient taking differently: Place 2 sprays into both nostrils daily as needed for allergies. )  . gabapentin (NEURONTIN) 300 MG capsule TAKE 1 CAPSULE BY MOUTH 3 TIMES A DAY FOR CHRONIC PAIN (Patient taking differently: Take 300 mg by mouth 3 (three) times daily. )  . gemfibrozil (LOPID) 600 MG tablet Take     1 tablet     2 x /day     with Meals      for Cholesterol & Triglycerides (Patient taking differently: Take 600 mg by mouth 2 (two) times daily before a meal. )  . levothyroxine (SYNTHROID) 100 MCG tablet  TAKE 1 TABLET BY MOUTH ON TUES, WED, THURS, SAT AND SUN. TAKE 1& 1/2 TABLET ON MON AND FRI (Patient taking differently: Take 100 mcg by mouth daily. )  . meclizine (ANTIVERT) 25 MG tablet TAKE 1 TABLET (25 MG TOTAL) BY MOUTH 3 (THREE) TIMES DAILY AS NEEDED FOR DIZZINESS.  [name] metroNIDAZOLE (FLAGYL) 500 MG tablet Take 500 mg by mouth in the morning and at bedtime.   . NEOMYCIN-POLYMYXIN-HYDROCORTISONE (CORTISPORIN) 1 % SOLN OTIC solution Place 3 drops into both ears 4 (four) times daily. (Patient taking differently: Place 3 drops into both ears 4 (four) times daily as needed (ear infection). )  . nystatin (MYCOSTATIN) 100000 UNIT/ML suspension Take 15 mLs by mouth daily as needed (tooth infections).   [name] oxybutynin (DITROPAN) 5 MG tablet Take 1 tablet 2 x /day for Bladder Control (Patient taking differently: Take 5 mg by mouth daily. )  . polyethylene glycol (MIRALAX / GLYCOLAX) packet Take 17 g by mouth daily as needed for mild constipation. (Patient taking differently: Take 17 g by mouth daily. May take additional dose to equal 34g a day)  . promethazine-dextromethorphan (PROMETHAZINE-DM) 6.25-15 MG/5ML syrup Take 1 to 2 teaspoonfuls every 4 hour if needed for cough  . Respiratory Therapy Supplies (FLUTTER) DEVI Use flutter valve 3 times a day  . rOPINIRole (REQUIP) 2 MG tablet TAKE 1 TABLET FOUR TIMES A DAY FOR  RESTLESS LEGS (Patient taking differently: Take 2 mg by mouth in the morning, at noon, in the evening, and at bedtime. )  . tiZANidine (ZANAFLEX) 2 MG tablet TAKE 1/2 TO 1 TABLET 3 TIMES A DAY AS NEEDED FOR MUSCLE SPASMS (Patient taking differently: Take 2 mg by mouth at bedtime. )  . traMADol (ULTRAM) 50 MG tablet Take 50 mg by mouth 4 (four) times daily as needed for moderate pain.   No current facility-administered medications on file prior to visit.    ROS: all negative except above.   Physical Exam:  BP 124/86   Pulse 74   Temp (!) 97.3 F (36.3 C)   Ht 5\' 2"  (1.575 m)   Wt 158 lb 8 oz (71.9 kg)   SpO2 96%   BMI 28.99 kg/m   General Appearance: Well nourished, in no apparent distress. Eyes: PERRLA, EOMs, conjunctiva no swelling or erythema Hearing normal.  Neck: Supple, thyroid normal.  Respiratory: Respiratory effort normal, BS equal bilaterally without rales, rhonchi, wheezing or stridor.  Cardio: RRR with no MRGs. Brisk peripheral pulses without edema.  Abdomen: Soft, + BS.  Non tender, no guarding, rebound, hernias, masses. Lymphatics: Non tender without lymphadenopathy.  Musculoskeletal: Full ROM, 5/5 strength, antalgic gait. Boot to right foot and ankle brace to left. Skin: Warm, dry without rashes, lesions, ecchymosis to left hip extended to flank, purple.  Right posterior thigh golf ball ize hematoma, firm fluctuant severe ecchymosis dark purple extending full length of lateral hamstring. Neuro: Cranial nerves intact. Normal muscle tone, no cerebellar symptoms. Sensation intact.  Psych: Awake and oriented X 3, normal affect, Insight and Judgment appropriate.      , Elder Negus, DNP [facility] 05/01/2020  5:32 PM

## 2020-05-01 NOTE — Patient Instructions (Addendum)
We will contact you via MyChart with your lab results.  Continue to monitor the area, it is currently size of golf ball.  If you need an order for a rolling walker with a seat let us know.   Hematoma A hematoma is a collection of blood. A hematoma can happen:  Under the skin.  In an organ.  In a body space.  In a joint space.  In other tissues. The blood can thicken (clot) to form a lump that you can see and feel. The lump is often hard and may become sore and tender. The lump can be very small or very big. Most hematomas get better in a few days to weeks. However, some hematomas may be serious and need medical care. What are the causes? This condition is caused by:  An injury.  Blood that leaks under the skin.  Problems from surgeries.  Medical conditions that cause bleeding or bruising. What increases the risk? You are more likely to develop this condition if:  You are an older adult.  You use medicines that thin your blood. What are the signs or symptoms? Symptoms depend on where the hematoma is in your body.  If the hematoma is under the skin, there is: ? A firm lump on the body. ? Pain and tenderness in the area. ? Bruising. The skin above the lump may be blue, dark blue, purple-red, or yellowish.  If the hematoma is deep in the tissues or body spaces, there may be: ? Blood in the stomach. This may cause pain in the belly (abdomen), weakness, passing out (fainting), and shortness of breath. ? Blood in the head. This may cause a headache, weakness, trouble speaking or understanding speech, or passing out. How is this diagnosed? This condition is diagnosed based on:  Your medical history.  A physical exam.  Imaging tests, such as ultrasound or CT scan.  Blood tests. How is this treated? Treatment depends on the cause, size, and location of the hematoma. Treatment may include:  Doing nothing. Many hematomas go away on their own without  treatment.  Surgery or close monitoring. This may be needed for large hematomas or hematomas that affect the body's organs.  Medicines. These may be given if a medical condition caused the hematoma. Follow these instructions at home: Managing pain, stiffness, and swelling   If told, put ice on the area. ? Put ice in a plastic bag. ? Place a towel between your skin and the bag. ? Leave the ice on for 20 minutes, 2-3 times a day for the first two days.  If told, put heat on the affected area after putting ice on the area for two days. Use the heat source that your doctor tells you to use. This could be a moist heat pack or a heating pad. To do this: ? Place a towel between your skin and the heat source. ? Leave the heat on for 20-30 minutes. ? Remove the heat if your skin turns bright red. This is very important if you are unable to feel pain, heat, or cold. You may have a greater risk of getting burned.  Raise (elevate) the affected area above the level of your heart while you are sitting or lying down.  Wrap the affected area with an elastic bandage, if told by your doctor. Do not wrap the bandage too tightly.  If your hematoma is on a leg or foot and is painful, your doctor may give you crutches. Use  them as told by your doctor. General instructions  Take over-the-counter and prescription medicines only as told by your doctor.  Keep all follow-up visits as told by your doctor. This is important. Contact a doctor if:  You have a fever.  The swelling or bruising gets worse.  You start to get more hematomas. Get help right away if:  Your pain gets worse.  Your pain is not getting better with medicine.  Your skin over the hematoma breaks or starts to bleed.  Your hematoma is in your chest or belly and you: ? Pass out. ? Feel weak. ? Become short of breath.  You have a hematoma on your scalp that is caused by a fall or injury, and you: ? Have a headache that gets  worse. ? Have trouble speaking or understanding speech. ? Become less alert or you pass out. Summary  A hematoma is a collection of blood in any part of your body.  Most hematomas get better on their own in a few days to weeks. Some may need medical care.  Follow instructions from your doctor about how to care for your hematoma.  Contact a doctor if the swelling or bruising gets worse, or if you are short of breath. This information is not intended to replace advice given to you by your health care provider. Make sure you discuss any questions you have with your health care provider. Document Revised: 11/19/2017 Document Reviewed: 11/19/2017 Elsevier Patient Education  2020 [employer].

## 2020-05-02 ENCOUNTER — Encounter: Payer: Self-pay | Admitting: Podiatry

## 2020-05-02 ENCOUNTER — Ambulatory Visit: Payer: Medicare PPO | Admitting: Podiatry

## 2020-05-02 DIAGNOSIS — R2681 Unsteadiness on feet: Secondary | ICD-10-CM | POA: Diagnosis not present

## 2020-05-02 DIAGNOSIS — S92354A Nondisplaced fracture of fifth metatarsal bone, right foot, initial encounter for closed fracture: Secondary | ICD-10-CM | POA: Diagnosis not present

## 2020-05-03 DIAGNOSIS — M79605 Pain in left leg: Secondary | ICD-10-CM | POA: Diagnosis not present

## 2020-05-03 DIAGNOSIS — M48062 Spinal stenosis, lumbar region with neurogenic claudication: Secondary | ICD-10-CM | POA: Diagnosis not present

## 2020-05-03 DIAGNOSIS — M79604 Pain in right leg: Secondary | ICD-10-CM | POA: Diagnosis not present

## 2020-05-03 DIAGNOSIS — R531 Weakness: Secondary | ICD-10-CM | POA: Diagnosis not present

## 2020-05-03 NOTE — Progress Notes (Signed)
Subjective:   Patient ID: Crystal [name], female   DOB: 69 y.o.   MRN: [id number]   HPI Patient presents stating that she fractured her right foot about a week ago and she is wearing a shoe and it still moderately sore and also she is concerned about the brace on her left foot   ROS      Objective:  Physical Exam  Neurovascular status unchanged with discomfort base of fifth metatarsal right with inflammation fluid buildup and on the left I did note that there is pressure in the forefoot and she does have collapse medial longitudinal arch foot drop.  Patient is wearing a balance brace which is providing her some benefit but she has concerns      Assessment:  Fracture of the base of the fifth metatarsal right that appears to be nondisplaced along with history of balance issues using a walker with balance bracing left     Plan:  H&P reviewed both conditions.  For the right we will continue boot usage but I want her to gradually reduce it over the next couple weeks to toleration and for the left we will continue with wide toe box and other modalities.  Patient will be seen back to recheck and I did explain that she needs to be careful with her right foot and spent a great deal of time educating her on fracture

## 2020-05-04 LAB — CBC WITH DIFFERENTIAL/PLATELET
Absolute Monocytes: 721 cells/uL (ref 200–950)
Basophils Absolute: 122 cells/uL (ref 0–200)
Basophils Relative: 1.8 %
Eosinophils Absolute: 122 cells/uL (ref 15–500)
Eosinophils Relative: 1.8 %
HCT: 45.4 % — ABNORMAL HIGH (ref 35.0–45.0)
Hemoglobin: 15.4 g/dL (ref 11.7–15.5)
Lymphs Abs: 1469 cells/uL (ref 850–3900)
MCH: 32 pg (ref 27.0–33.0)
MCHC: 33.9 g/dL (ref 32.0–36.0)
MCV: 94.2 fL (ref 80.0–100.0)
MPV: 9.1 fL (ref 7.5–12.5)
Monocytes Relative: 10.6 %
Neutro Abs: 4366 cells/uL (ref 1500–7800)
Neutrophils Relative %: 64.2 %
Platelets: 329 10*3/uL (ref 140–400)
RBC: 4.82 10*6/uL (ref 3.80–5.10)
RDW: 12.5 % (ref 11.0–15.0)
Total Lymphocyte: 21.6 %
WBC: 6.8 10*3/uL (ref 3.8–10.8)

## 2020-05-04 LAB — PROTIME-INR
INR: 1
Prothrombin Time: 10.2 s (ref 9.0–11.5)

## 2020-05-04 LAB — VON WILLEBRAND ANTIGEN: Von Willebrand Antigen, Plasma: 267 % — ABNORMAL HIGH (ref 50–217)

## 2020-05-05 ENCOUNTER — Other Ambulatory Visit: Payer: Self-pay | Admitting: Adult Health Nurse Practitioner

## 2020-05-05 DIAGNOSIS — D68318 Other hemorrhagic disorder due to intrinsic circulating anticoagulants, antibodies, or inhibitors: Secondary | ICD-10-CM | POA: Insufficient documentation

## 2020-05-14 ENCOUNTER — Ambulatory Visit: Payer: Medicare PPO | Admitting: Orthotics

## 2020-05-14 ENCOUNTER — Other Ambulatory Visit: Payer: Self-pay

## 2020-05-14 DIAGNOSIS — L84 Corns and callosities: Secondary | ICD-10-CM

## 2020-05-14 DIAGNOSIS — E119 Type 2 diabetes mellitus without complications: Secondary | ICD-10-CM

## 2020-05-14 NOTE — Progress Notes (Signed)
Ordered Diabetic shoes for Crystal [name]; she qualifiys as DM2 patient as well as foot deformity, contractures, and pre-ulcer callus.

## 2020-05-15 ENCOUNTER — Telehealth: Payer: Self-pay | Admitting: Hematology and Oncology

## 2020-05-15 NOTE — Telephone Encounter (Signed)
Received a new hem referral from Dr. Melford Aase for Von Willebrand factor autoantibody present. Ms. Reader has been cld and scheduled to see Dr. Chryl Heck on 11/18 at 11am. Pt aware to arrive 30 minutes early.

## 2020-05-17 ENCOUNTER — Inpatient Hospital Stay: Payer: Medicare PPO | Attending: Hematology and Oncology | Admitting: Hematology and Oncology

## 2020-05-17 ENCOUNTER — Other Ambulatory Visit: Payer: Self-pay

## 2020-05-17 ENCOUNTER — Inpatient Hospital Stay: Payer: Medicare PPO

## 2020-05-17 VITALS — BP 140/88 | HR 85 | Temp 98.2°F | Resp 18 | Ht 62.0 in | Wt 159.6 lb

## 2020-05-17 DIAGNOSIS — I10 Essential (primary) hypertension: Secondary | ICD-10-CM | POA: Diagnosis not present

## 2020-05-17 DIAGNOSIS — D680 Von Willebrand's disease: Secondary | ICD-10-CM | POA: Diagnosis not present

## 2020-05-17 DIAGNOSIS — R238 Other skin changes: Secondary | ICD-10-CM

## 2020-05-17 DIAGNOSIS — E785 Hyperlipidemia, unspecified: Secondary | ICD-10-CM | POA: Diagnosis not present

## 2020-05-17 DIAGNOSIS — K5090 Crohn's disease, unspecified, without complications: Secondary | ICD-10-CM | POA: Insufficient documentation

## 2020-05-17 DIAGNOSIS — R233 Spontaneous ecchymoses: Secondary | ICD-10-CM

## 2020-05-17 DIAGNOSIS — E039 Hypothyroidism, unspecified: Secondary | ICD-10-CM | POA: Insufficient documentation

## 2020-05-17 LAB — CBC WITH DIFFERENTIAL/PLATELET
Abs Immature Granulocytes: 0.16 10*3/uL — ABNORMAL HIGH (ref 0.00–0.07)
Basophils Absolute: 0.1 10*3/uL (ref 0.0–0.1)
Basophils Relative: 1 %
Eosinophils Absolute: 0.1 10*3/uL (ref 0.0–0.5)
Eosinophils Relative: 2 %
HCT: 43.6 % (ref 36.0–46.0)
Hemoglobin: 14.3 g/dL (ref 12.0–15.0)
Immature Granulocytes: 2 %
Lymphocytes Relative: 17 %
Lymphs Abs: 1.5 10*3/uL (ref 0.7–4.0)
MCH: 31.8 pg (ref 26.0–34.0)
MCHC: 32.8 g/dL (ref 30.0–36.0)
MCV: 97.1 fL (ref 80.0–100.0)
Monocytes Absolute: 1 10*3/uL (ref 0.1–1.0)
Monocytes Relative: 11 %
Neutro Abs: 6 10*3/uL (ref 1.7–7.7)
Neutrophils Relative %: 67 %
Platelets: 298 10*3/uL (ref 150–400)
RBC: 4.49 MIL/uL (ref 3.87–5.11)
RDW: 12.8 % (ref 11.5–15.5)
WBC: 8.9 10*3/uL (ref 4.0–10.5)
nRBC: 0 % (ref 0.0–0.2)

## 2020-05-17 LAB — PLATELET FUNCTION ASSAY: Collagen / Epinephrine: 81 seconds (ref 0–193)

## 2020-05-17 LAB — APTT: aPTT: 36 seconds (ref 24–36)

## 2020-05-17 NOTE — Progress Notes (Signed)
[place] NOTE  Patient Care Team: Unk Pinto, MD as PCP - General (Internal Medicine) Star Age, MD as Attending Physician (Neurology) Rozetta Nunnery, MD as Consulting Physician (Otolaryngology) Richmond Campbell, MD as Consulting Physician (Gastroenterology) Erline Levine, MD as Consulting Physician (Neurosurgery) Katy Apo, MD as Consulting Physician (Ophthalmology) Crista Luria, MD as Consulting Physician (Dermatology) Vickey Huger, MD as Consulting Physician (Orthopedic Surgery) Valeta Harms, MD as Referring Physician (Orthopedic Surgery) Schulenklopper, Aart, PT as Physical Therapist (Physical Therapy)  CHIEF COMPLAINTS/PURPOSE OF CONSULTATION:  Elevated VWF antigen and easy bruising.   ASSESSMENT & PLAN:  No problem-specific Assessment & Plan notes found for this encounter.  Orders Placed This Encounter  Procedures  . Platelet function assay    Standing Status:   Future    Number of Occurrences:   1    Standing Expiration Date:   05/17/2021  . APTT    Standing Status:   Future    Number of Occurrences:   1    Standing Expiration Date:   05/17/2021  . CBC with Differential/Platelet    Standing Status:   Standing    Number of Occurrences:   22    Standing Expiration Date:   05/17/2021  . Vitamin K1, Serum    Standing Status:   Future    Number of Occurrences:   1    Standing Expiration Date:   05/17/2021   This is a very pleasant 69 year old female patient with multiple comorbidities who was referred to hematology for evaluation of easy bruising and elevated von Willebrand factor antigen levels.  [name] arrived to the appointment today by herself, walks with walker.  She tells me that she has been falling quite a bit because of occasional vertigo and every time she falls she notices a big bruise.  She was taking aspirin for stroke prevention up until a week ago when she was asked to quit it. She denies any  spontaneous bruising, no major bleeding complications after multiple surgeries, pregnancy or postpartum.  No family history of hematological disorders. She had some labs which showed normal platelet count, normal INR, elevated von Willebrand factor antigen which is history evaluate easy bruising. Physical examination, elderly appearing female, no major bruises noted today, hematoma noted on the right leg which according to the patient has been getting smaller, no other lymphadenopathy or hepatosplenomegaly. I explained to the patient that elevated von Willebrand antigen levels will be seen in patients with stress, especially comorbidities.  Usually when the levels are lower, that is suspicious of von Willebrand's disease which can explain easy bruising in some patients.  In her case, I do believe it is likely aspirin use platelet dysfunction however I have ordered a PTT, platelet function in addition to a repeat CBC to look for other abnormalities.  If there is no evidence of major findings on the above-mentioned labs, then I would recommend monitoring.  I do not believe she needs regular follow-up with hematology.  However encouraged her to contact us if she has any other hematology questions or concerns or if she notes any spontaneous bleeding. Age-appropriate cancer screening recommended. She will discuss with her PCP about polypharmacy as well as.  I have also advised her to take time when changing positions to reduce the number of falls. Thank you for consulting [place] in the care of this patient.  Please do not hesitate to contact us with any additional questions or concerns.  HISTORY OF PRESENTING ILLNESS:  Crystal Celeste  Z[place]hlsdorff [name] 69 y.o. female is here because of abnormal labs.  This is a very pleasant 69 yr old female patient with multiple comorbidities including Crohn's disease, hypertension, dyslipidemia, fibromyalgia, osteoarthritis, multiple surgeries referred to hematology given easy  bruising and some abnormal labs.  Ms. Crystal [name] arrived to the appointment today by herself.  She walks with a walker.  She tells me that she has been falling quite a bit lately and every time she falls she notices a big bruise.  She was taking aspirin until a week ago since she had questionable multiple transient ischemic attacks.  She otherwise has not been taking any blood thinners.  She tells me that she has had multiple surgeries, pregnancies and that this never ended in any major bleeding complications.  She denies any spontaneous bruising without any trauma.  She denies any known bleeding disorders in the family.  No known nutritional deficiencies.  She does eat meat and does take a vitamin C supplement.  She tells me that the bruise is healing on time.  Currently she does not have any major bruises to show me.  She went to her primary care physician who ordered some labs and this showed elevated von Willebrand factor antigen and hence she wanted to see hematology. Falls are mostly because she feels dizzy when she changes positions.  Occasionally falls can be without any dizziness.  She is on multiple medications for several of her medical comorbidities, working with her primary care doctor to reduce the number of medication she has to take.  She denies any changes in breathing today for me, no bleeding in her stool or in her urine.  REVIEW OF SYSTEMS:   Constitutional: Denies fevers, chills or abnormal night sweats unintentional weight loss. Ears, nose, mouth, throat, and face: Denies mucositis or sore throat, she feels that her nose is blocked from time to time after recent fall when she had her nose.  She has been checked by her ENT. Respiratory: Denies cough, dyspnea or wheezes Cardiovascular: Denies palpitation, chest discomfort or lower extremity swelling Gastrointestinal:  Denies nausea, heartburn or change in bowel habits, has baseline Crohn's disease diagnosed in 2009 and takes Humira. Skin:  Denies abnormal skin rashes today, notes easy bruising. Lymphatics: Denies new lymphadenopathy  Neurological:Denies numbness, tingling or new weaknesses, multiple falls reported as mentioned above. Behavioral/Psych: Mood is stable, no new changes  All other systems were reviewed with the patient and are negative.  MEDICAL HISTORY:  Past Medical History:  Diagnosis Date  . Arthritis   . ASCVD (arteriosclerotic cardiovascular disease)   . Closed nondisplaced subtrochanteric fracture of left femur ([place]) 09/03/2017   September 2018.  Status post surgery by Dr. Marcelino Scot  . Depression   . Fibromyalgia   . Gait disorder 10/26/2012  . GERD (gastroesophageal reflux disease)   . GI bleed   . Hyperlipidemia   . Hypertension   . Hypothyroid   . Periprosthetic supracondylar fracture of femur, left  03/23/2017  . Restless leg syndrome   . Rhinitis 06/25/2010  . Sleep apnea   . TIA (transient ischemic attack)    3        . Vitamin D deficiency     SURGICAL HISTORY: Past Surgical History:  Procedure Laterality Date  . ABDOMINAL HYSTERECTOMY  1992  . ANTERIOR CERVICAL DECOMPRESSION/DISCECTOMY FUSION 4 LEVELS N/A 06/14/2015   Procedure: Cervical three-four Cervical four-five Cervical five- six Cervical six- seven  Anterior cervical decompression/diskectomy/fusion;  Surgeon: Erline Levine, MD;  Location: [facility]  NEURO ORS;  Service: Neurosurgery;  Laterality: N/A;  C3-4 C4-5 C5-6 C6-7 Anterior cervical decompression/diskectomy/fusion  . APPENDECTOMY  1960  . [place]  . JOINT REPLACEMENT     2   LEFT  1 RIGHT   . KNEE SURGERY    . ORIF FEMUR FRACTURE Left 03/24/2017   Procedure: OPEN REDUCTION INTERNAL FIXATION (ORIF) FEMUR FRACTURE;  Surgeon: Altamese Wilton, MD;  Location: [place];  Service: Orthopedics;  Laterality: Left;  . ROTATOR CUFF REPAIR Left 2005   2 LEFT  1  RIGHT  . SHOULDER ADHESION RELEASE    . SPINE SURGERY    . TOE SURGERY     LEFT       . TOE SURGERY     RIGHT   BIG TOE  . TONSILLECTOMY  1960    SOCIAL HISTORY: Social History   Socioeconomic History  . Marital status: Married    Spouse name: Not on file  . Number of children: 2  . Years of education: Not on file  . Highest education level: Not on file  Occupational History  . Occupation: Retired Games developer: DISABLED  Tobacco Use  . Smoking status: Never Smoker  . Smokeless tobacco: Never Used  Vaping Use  . Vaping Use: Never used  Substance and Sexual Activity  . Alcohol use: Yes    Alcohol/week: 0.0 standard drinks    Comment: rarely  . Drug use: No  . Sexual activity: Not on file  Other Topics Concern  . Not on file  Social History Narrative   Drinks about 1 cup of coffee a day, occasional coke zero    Social Determinants of Health   Financial Resource Strain:   . Difficulty of Paying Living Expenses: Not on file  Food Insecurity:   . Worried About Charity fundraiser in the Last Year: Not on file  . Ran Out of Food in the Last Year: Not on file  Transportation Needs:   . Lack of Transportation (Medical): Not on file  . Lack of Transportation (Non-Medical): Not on file  Physical Activity:   . Days of Exercise per Week: Not on file  . Minutes of Exercise per Session: Not on file  Stress:   . Feeling of Stress : Not on file  Social Connections:   . Frequency of Communication with Friends and Family: Not on file  . Frequency of Social Gatherings with Friends and Family: Not on file  . Attends Religious Services: Not on file  . Active Member of Clubs or Organizations: Not on file  . Attends Archivist Meetings: Not on file  . Marital Status: Not on file  Intimate Partner Violence:   . Fear of Current or Ex-Partner: Not on file  . Emotionally Abused: Not on file  . Physically Abused: Not on file  . Sexually Abused: Not on file    FAMILY HISTORY: Family History  Problem Relation Age of Onset  . Leukemia Sister   . Lupus Sister   .  Depression Sister   . Rheum arthritis Sister   . Heart disease Father   . Hypertension Father   . Hyperlipidemia Father   . Neuropathy Father   . Cancer Mother   . Diabetes Mother   . Osteoporosis Mother   . Breast cancer Mother   . Migraines Daughter     ALLERGIES:  is allergic to atorvastatin, codeine, pravastatin, statins, amitriptyline, fish oil, hydrocodone, linseed  oil, nuvigil [armodafinil], sulfa antibiotics, erythromycin, erythromycin base, flax seed [bio-flax], hydrocodone-acetaminophen, morphine, nsaids, sertraline, sinemet [carbidopa w-levodopa], sulfamethoxazole, and tolmetin.  MEDICATIONS:  Current Outpatient Medications  Medication Sig Dispense Refill  . Adalimumab 40 MG/0.4ML PNKT Inject 40 mg into the skin every 14 (fourteen) days.     [name] ascorbic acid (VITAMIN C) 100 MG tablet Take 100 mg by mouth daily. "Gummy"    . bisoprolol (ZEBETA) 10 MG tablet Take    1 tablet    Daily       for BP (Patient taking differently: Take 10 mg by mouth daily. ) 90 tablet 3  . budesonide (ENTOCORT EC) 3 MG 24 hr capsule Take by mouth.    [name] buPROPion (WELLBUTRIN XL) 300 MG 24 hr tablet Take    1 tablet    Daily     for Mood, Focus & Concentration (Patient taking differently: Take 300 mg by mouth daily. ) 90 tablet 0  . calcium carbonate (OS-CAL) 1250 (500 Ca) MG chewable tablet Chew 1 tablet by mouth daily.    . Cholecalciferol 125 MCG (5000 UT) TABS Take by mouth.    . clotrimazole (MYCELEX) 10 MG troche Take 1 tablet by mouth daily.    . Cyanocobalamin (B-12 SL) Place 1 each under the tongue daily.     [name] dexamethasone 0.5 MG/5ML elixir Take 0.5 mg by mouth 5 (five) times daily as needed (tooth infections).     . diclofenac Sodium (VOLTAREN) 1 % GEL Apply 4 g topically 4 (four) times daily. 100 g 0  . DIGESTIVE ENZYMES PO Take 1 each by mouth daily.    . DULoxetine (CYMBALTA) 60 MG capsule Take 1 capsule Daily for Chronic Pain (Patient taking differently: Take 60 mg by mouth daily. ) 90  capsule 3  . famotidine (PEPCID) 40 MG tablet Take 40 mg by mouth daily.    . fenofibrate micronized (LOFIBRA) 134 MG capsule Take 1 tablet 1 x  /day for Triglycerides (Blood Fats) (Patient taking differently: Take 134 mg by mouth daily before breakfast. ) 90 capsule 0  . fluconazole (DIFLUCAN) 150 MG tablet Take 150 mg by mouth See admin instructions. Sundays and Thursday    . fluticasone (FLONASE) 50 MCG/ACT nasal spray USE TWO SPRAYS IN EACH NOSTRIL DAILY AS NEEDED (Patient taking differently: Place 2 sprays into both nostrils daily as needed for allergies. ) 48 g 3  . gabapentin (NEURONTIN) 300 MG capsule TAKE 1 CAPSULE BY MOUTH 3 TIMES A DAY FOR CHRONIC PAIN (Patient taking differently: Take 300 mg by mouth 3 (three) times daily. ) 270 capsule 0  . levothyroxine (SYNTHROID) 100 MCG tablet TAKE 1 TABLET BY MOUTH ON TUES, WED, THURS, SAT AND SUN. TAKE 1& 1/2 TABLET ON MON AND FRI (Patient taking differently: Take 100 mcg by mouth daily. ) 102 tablet 3  . metroNIDAZOLE (FLAGYL) 500 MG tablet Take 500 mg by mouth in the morning and at bedtime.     . multivitamin (VIT W/EXTRA C) CHEW chewable tablet Chew 1 tablet by mouth daily.    . NEOMYCIN-POLYMYXIN-HYDROCORTISONE (CORTISPORIN) 1 % SOLN OTIC solution Place 3 drops into both ears 4 (four) times daily. (Patient taking differently: Place 3 drops into both ears 4 (four) times daily as needed (ear infection). ) 10 mL 0  . nystatin (MYCOSTATIN) 100000 UNIT/ML suspension Take 15 mLs by mouth daily as needed (tooth infections).     [name] oxybutynin (DITROPAN) 5 MG tablet Take 1 tablet 2 x /day for Bladder  Control (Patient taking differently: Take 5 mg by mouth daily. ) 180 tablet 0  . rOPINIRole (REQUIP) 2 MG tablet TAKE 1 TABLET FOUR TIMES A DAY FOR RESTLESS LEGS (Patient taking differently: Take 2 mg by mouth in the morning, at noon, in the evening, and at bedtime. ) 360 tablet 3  . traMADol (ULTRAM) 50 MG tablet Take 50 mg by mouth 4 (four) times daily as  needed for moderate pain.    [name] acyclovir (ZOVIRAX) 400 MG tablet Take 400 mg by mouth as needed (flare ups).  (Patient not taking: Reported on 05/17/2020)    . gemfibrozil (LOPID) 600 MG tablet Take     1 tablet     2 x /day     with Meals      for Cholesterol & Triglycerides (Patient not taking: Reported on 05/17/2020) 180 tablet 0  . hydrocortisone 2.5 % ointment in the morning and at bedtime. (Patient not taking: Reported on 05/17/2020)    . meclizine (ANTIVERT) 25 MG tablet TAKE 1 TABLET (25 MG TOTAL) BY MOUTH 3 (THREE) TIMES DAILY AS NEEDED FOR DIZZINESS. (Patient not taking: Reported on 05/17/2020) 90 tablet 0  . polyethylene glycol (MIRALAX / GLYCOLAX) packet Take 17 g by mouth daily as needed for mild constipation. (Patient not taking: Reported on 05/17/2020) 14 each 0  . promethazine-dextromethorphan (PROMETHAZINE-DM) 6.25-15 MG/5ML syrup Take 1 to 2 teaspoonfuls every 4 hour if needed for cough (Patient not taking: Reported on 05/17/2020) 360 mL 0  . Respiratory Therapy Supplies (FLUTTER) DEVI Use flutter valve 3 times a day (Patient not taking: Reported on 05/17/2020) 1 each 0  . tiZANidine (ZANAFLEX) 2 MG tablet TAKE 1/2 TO 1 TABLET 3 TIMES A DAY AS NEEDED FOR MUSCLE SPASMS (Patient not taking: Reported on 05/17/2020) 90 tablet 0  . zinc gluconate 50 MG tablet Take by mouth. (Patient not taking: Reported on 05/17/2020)     No current facility-administered medications for this visit.     PHYSICAL EXAMINATION: ECOG PERFORMANCE STATUS: 2 - Symptomatic, <50% confined to bed  Vitals:   05/17/20 1045  BP: 140/88  Pulse: 85  Resp: 18  Temp: 98.2 F (36.8 C)  SpO2: 90%   Filed Weights   05/17/20 1045  Weight: 159 lb 9.6 oz (72.4 kg)    GENERAL:alert, no distress and comfortable, walks with a walker SKIN: skin color, texture, turgor are normal, no rashes or significant lesions EYES: normal, conjunctiva are pink and non-injected, sclera clear OROPHARYNX:no exudate, no erythema  and lips, buccal mucosa, and tongue normal  NECK: supple, thyroid normal size, non-tender, without nodularity LYMPH:  no palpable lymphadenopathy in the cervical, axillary or inguinal LUNGS: clear to auscultation and percussion with normal breathing effort HEART: regular rate & rhythm and no murmurs and no lower extremity edema ABDOMEN:abdomen soft, non-tender and normal bowel sounds Musculoskeletal:no cyanosis of digits and no clubbing  PSYCH: alert & oriented x 3 with fluent speech NEURO: no focal motor/sensory deficits Skin: No abnormal bruises found, hematoma on the left leg has diminished in size according to patient.  LABORATORY DATA:  I have reviewed the data as listed Lab Results  Component Value Date   WBC 8.9 05/17/2020   HGB 14.3 05/17/2020   HCT 43.6 05/17/2020   MCV 97.1 05/17/2020   PLT 298 05/17/2020     Chemistry      Component Value Date/Time   NA 143 03/20/2020 1012   NA 142 03/28/2017 0000   NA 141 10/20/2012 1358  K 3.8 03/20/2020 1012   K 3.9 10/20/2012 1358   CL 100 03/20/2020 1012   CL 104 10/20/2012 1358   CO2 35 (H) 03/20/2020 1012   CO2 27 10/20/2012 1358   BUN 17 03/20/2020 1012   BUN 14 03/28/2017 0000   BUN 15.6 10/20/2012 1358   CREATININE 0.72 03/20/2020 1012   CREATININE 1.1 10/20/2012 1358   GLU 91 03/28/2017 0000      Component Value Date/Time   CALCIUM 9.8 03/20/2020 1012   CALCIUM 7.8 (L) 03/26/2017 0354   CALCIUM 9.7 10/20/2012 1358   ALKPHOS 45 01/06/2017 1538   ALKPHOS 48 10/20/2012 1358   AST 16 03/20/2020 1012   AST 12 10/20/2012 1358   ALT 14 03/20/2020 1012   ALT 10 10/20/2012 1358   BILITOT 0.4 03/20/2020 1012   BILITOT 0.31 10/20/2012 1358     Reviewed pertinent labs.  Normal platelet count.  Elevated von Willebrand factor antigen is likely from inflammation.  RADIOGRAPHIC STUDIES: I have personally reviewed the radiological images as listed and agreed with the findings in the report. DG Ribs Unilateral W/Chest  Left  Result Date: 04/23/2020 CLINICAL DATA:  Recent fall with pain EXAM: LEFT RIBS AND CHEST - 3+ VIEW COMPARISON:  Chest radiograph January 18, 2020. FINDINGS: Frontal chest as well as oblique and cone-down rib images were obtained. Lungs are clear. Heart size and pulmonary vascularity are normal. No adenopathy. There is mild elevation of the right hemidiaphragm. Evidence of old trauma with remodeling in each lateral clavicle. Total shoulder replacement noted on the right. Postoperative change lower cervical spine. No pneumothorax or pleural effusion.  No appreciable rib fracture. IMPRESSION: No acute rib fracture. Evidence of prior old trauma each lateral clavicle. Areas of postoperative change in the lower cervical spine and right shoulder. Lungs clear. Heart size normal. Electronically Signed   By: Lowella Grip III M.D.   On: 04/23/2020 11:10   DG Foot Complete Right  Result Date: 04/23/2020 CLINICAL DATA:  Right foot pain EXAM: RIGHT FOOT COMPLETE - 3+ VIEW COMPARISON:  11/25/2019 FINDINGS: Acute nondisplaced fracture of the fifth metatarsal base. There appears to be intra-articular extension to the fifth TMT joint. Surgical hardware in the distal first metatarsal and great toe proximal phalanx without evidence of hardware complication. Unchanged mild hallux valgus with moderate arthropathy of the first MTP joint. Severe degenerative changes at the second MTP joint. There is chronic ankylosis of the proximal interphalangeal joints of the second and third toes, unchanged. Degenerative changes at the anterior tibiotalar joint. Small plantar calcaneal spur. Soft tissues within normal limits. IMPRESSION: 1. Acute nondisplaced avulsion type fracture of the fifth metatarsal base. 2. Chronic degenerative changes of the right foot, similar to prior study. Electronically Signed   By: Davina Poke D.O.   On: 04/23/2020 11:11   DG Hip Unilat W or Wo Pelvis 2-3 Views Right  Result Date:  04/23/2020 CLINICAL DATA:  Pain following fall EXAM: DG HIP (WITH OR WITHOUT PELVIS) 2-3V RIGHT COMPARISON:  December 19, 2019 FINDINGS: Frontal pelvis as well as frontal and lateral right hip joint images obtained. No fracture or dislocation. There is narrowing of each hip joint, slightly more severe on the right than on the left, stable. There is bony overgrowth along each superolateral acetabulum. Sacroiliac joints appear normal. Postoperative change noted in the lower lumbar spine. IMPRESSION: Osteoarthritic change in each hip joint, slightly more severe on the right than on the left, stable. Bony overgrowth along each superolateral acetabulum potentially  places patient at increased risk for femoroacetabular impingement. No acute fracture or dislocation. Postoperative change in lower lumbar region noted. Electronically Signed   By: Lowella Grip III M.D.   On: 04/23/2020 11:08    All questions were answered. The patient knows to call the clinic with any problems, questions or concerns.  I spent a total of 45 minutes in the care of this patient, this 30 minutes were spent obtaining history and physical, discussion about common causes of easy bruising, counseling and coordination of care.     Benay Pike, MD 05/17/2020 12:51 PM

## 2020-05-18 DIAGNOSIS — G379 Demyelinating disease of central nervous system, unspecified: Secondary | ICD-10-CM | POA: Diagnosis not present

## 2020-05-18 DIAGNOSIS — G5601 Carpal tunnel syndrome, right upper limb: Secondary | ICD-10-CM | POA: Diagnosis not present

## 2020-05-18 DIAGNOSIS — G629 Polyneuropathy, unspecified: Secondary | ICD-10-CM | POA: Diagnosis not present

## 2020-05-18 DIAGNOSIS — G6289 Other specified polyneuropathies: Secondary | ICD-10-CM | POA: Diagnosis not present

## 2020-05-22 DIAGNOSIS — M545 Low back pain, unspecified: Secondary | ICD-10-CM | POA: Diagnosis not present

## 2020-05-22 DIAGNOSIS — M5416 Radiculopathy, lumbar region: Secondary | ICD-10-CM | POA: Diagnosis not present

## 2020-05-22 LAB — VITAMIN K1, SERUM: VITAMIN K1: 0.75 ng/mL (ref 0.10–2.20)

## 2020-05-28 DIAGNOSIS — M5412 Radiculopathy, cervical region: Secondary | ICD-10-CM | POA: Diagnosis not present

## 2020-05-28 DIAGNOSIS — R2689 Other abnormalities of gait and mobility: Secondary | ICD-10-CM | POA: Diagnosis not present

## 2020-05-28 DIAGNOSIS — M961 Postlaminectomy syndrome, not elsewhere classified: Secondary | ICD-10-CM | POA: Diagnosis not present

## 2020-05-28 DIAGNOSIS — M48061 Spinal stenosis, lumbar region without neurogenic claudication: Secondary | ICD-10-CM | POA: Diagnosis not present

## 2020-06-06 NOTE — Progress Notes (Signed)
History of Present Illness:     This very nice 69 y.o.  [name] with  HTN, HLD,  Diet T2_NIDDM, GERD, Crohn's Dz, Hypothyroidism, OSA/BiPAP and Vitamin D Deficiency presents with c/o labile elevated BPs over the last 4-5 days in the range     Medications  ENTOCORT EC) 3 MG 24 hr capsule, Take by mouth. .  dexamethasone 0.5 MG/5ML elixir, Take 0.5 mg by mouth 5  times daily as needed  .  levothyroxine  100 MCG tablet, TAKE 1 TABLET  DAILY  .  bisoprolol 10 MG tablet, Take    1 tablet    Daily       for BP  .  fenofibrate micronized (LOFIBRA) 134 MG capsule, Take 1 tablet 1 x  /day .  FLONASE nasal spray,  2 sprays into both nostrils daily as needed f .  Adalimumab 40 MG/0.4ML , Inject 40 mg into the skin every 14  days.  .  traMADol  50 MG tablet, Take 4  times daily as needed for  pain. [name]  B-12 SL, Place 1 each under the tongue daily.  [name] VITAMIN C 100 MG tablet, Take 100 mg daily. "Gummy" .  buPROPion-XL 300 MG 2, Take    1 tablet    Daily   .  OS-CAL 1250  MG  tablet, 1 tablet daily. .  Vitamin D 5000 U, Take daily .  clotrimazole (MYCELEX) 10 MG troche, Take 1 tablet  daily. .  diclofenac  1 % GEL, Apply 4 g topically 4  times daily. [name]  DIGESTIVE ENZYMES, Take 1 each  daily. .  DULoxetine  60 MG , Take 1 capsule Daily for Chronic Pain  .  famotidine  40 MG tablet, Take 40 mg  daily. .  Fluconazole 150 MG tablet, Take 150 mg Sundays and Thursday .  gabapentin  300 MG capsule, Take 300 mg     3  times daily. .  metroNIDAZOLE  500 MG tablet, Take 500 mg by mouth in the morning and at bedtime.  .  multivitamin   tablet, Chew 1 tablet  daily. .  NEOMYCIN-POLYMYXIN-HYDROCORTISONE  1 % SOLN OTIC solution, Place 3 drops into both ears 4 times daily.  [name]  nystatin (MYCOSTATIN) 100000 UNIT/ML suspension, Take 15 mLs by mouth daily as needed (tooth infections).  [name]  oxybutynin (DITROPAN) 5 MG tablet, Take 1 tablet 1150's / 90-100's.  - 2 x /day for Bladder Control  .  rOPINIRole  2 MG tablet,  TAKE 1 TABLET FOUR TIMES A DAY FOR RESTLESS LEGS   Problem list She has Obstructive sleep apnea; RESTLESS LEG SYNDROME; Essential hypertension; Rhinitis; GERD; Crohn's Enterocolitis / Regional enteritis; Fibromyalgia; Gait disorder; Hyperlipidemia associated with type 2 diabetes mellitus ([place]); Hypothyroid; Depression, major, recurrent, in partial remission ([place]); Vitamin D deficiency; ASCVD; Medication management; Rotator cuff arthropathy; Obesity (BMI 30.0-34.9); Spinal stenosis in cervical region; IBS (irritable colon syndrome); Sleep talking; B12 deficiency anemia; Polypharmacy; Bronchiectasis without complication ([place]); Arthritis of carpometacarpal (CMC) joints of both thumbs; Bilateral bunions; Cervical disc disease; Constipation; Crohn's ileitis ([place]); Rotator cuff tear; Diverticulosis; History of adenomatous polyp of colon; Internal hemorrhoids; Knee pain; Other specified arthropathy involving shoulder region; S/P revision of total knee, left; Diet-controlled type 2 diabetes mellitus ([place]); Rupture of popliteal cyst of right knee region (complex cyst); History of recurrent TIAs; COVID-19 virus infection; CKD stage 2 due to type 2 diabetes mellitus ([place]); History of total knee arthroplasty, right; Pain due to total right  knee replacement ([place]); Presence of artificial knee joint, right; and Von Willebrand factor autoantibody present ([place]) on their problem list.   Observations/Objective:  BP 138/86   Pulse (!) 114   Temp (!) 97 F (36.1 C)   Resp 16   Ht 5' 2"  (1.575 m)   Wt 158 lb 12.8 oz (72 kg)   SpO2 99%   BMI 29.04 kg/m   HEENT - WNL. Neck - supple.  Chest - Clear equal BS. Cor - Nl HS. RRR w/o sig MGR. PP 1(+). No edema. MS- FROM w/o deformities.  Gait Nl. Neuro -  Nl w/o focal abnormalities.  Assessment and Plan:  1. Essential hypertension  - olmesartan (BENICAR) 40 MG tablet; Take     1/2 to 1 tablet      at Night       for BP  Dispense: 90 tablet; Refill: 0  Follow Up  Instructions:      I discussed the assessment and treatment plan with the patient. The patient was provided an opportunity to ask questions and all were answered. The patient agreed with the plan and demonstrated an understanding of the instructions.       The patient was advised to call back or seek an in-person evaluation if the symptoms worsen or if the condition fails to improve as anticipated.   Kirtland Bouchard, MD

## 2020-06-07 ENCOUNTER — Other Ambulatory Visit: Payer: Self-pay

## 2020-06-07 ENCOUNTER — Encounter: Payer: Self-pay | Admitting: Internal Medicine

## 2020-06-07 ENCOUNTER — Ambulatory Visit (INDEPENDENT_AMBULATORY_CARE_PROVIDER_SITE_OTHER): Payer: Medicare PPO | Admitting: Internal Medicine

## 2020-06-07 VITALS — BP 138/86 | HR 114 | Temp 97.0°F | Resp 16 | Ht 62.0 in | Wt 158.8 lb

## 2020-06-07 DIAGNOSIS — I10 Essential (primary) hypertension: Secondary | ICD-10-CM | POA: Diagnosis not present

## 2020-06-07 MED ORDER — OLMESARTAN MEDOXOMIL 40 MG PO TABS
ORAL_TABLET | ORAL | 0 refills | Status: DC
Start: 2020-06-07 — End: 2020-08-30

## 2020-06-08 ENCOUNTER — Other Ambulatory Visit: Payer: Self-pay | Admitting: Neurosurgery

## 2020-06-08 DIAGNOSIS — M5412 Radiculopathy, cervical region: Secondary | ICD-10-CM

## 2020-06-14 DIAGNOSIS — M19049 Primary osteoarthritis, unspecified hand: Secondary | ICD-10-CM | POA: Diagnosis not present

## 2020-06-14 DIAGNOSIS — M180 Bilateral primary osteoarthritis of first carpometacarpal joints: Secondary | ICD-10-CM | POA: Diagnosis not present

## 2020-06-14 DIAGNOSIS — G5601 Carpal tunnel syndrome, right upper limb: Secondary | ICD-10-CM | POA: Diagnosis not present

## 2020-06-15 ENCOUNTER — Encounter: Payer: Self-pay | Admitting: Internal Medicine

## 2020-06-20 ENCOUNTER — Ambulatory Visit: Payer: Medicare PPO | Admitting: Adult Health

## 2020-06-20 DIAGNOSIS — M961 Postlaminectomy syndrome, not elsewhere classified: Secondary | ICD-10-CM | POA: Diagnosis not present

## 2020-06-20 DIAGNOSIS — M47812 Spondylosis without myelopathy or radiculopathy, cervical region: Secondary | ICD-10-CM | POA: Diagnosis not present

## 2020-06-28 ENCOUNTER — Other Ambulatory Visit: Payer: Self-pay | Admitting: Internal Medicine

## 2020-07-02 DIAGNOSIS — M5416 Radiculopathy, lumbar region: Secondary | ICD-10-CM | POA: Diagnosis not present

## 2020-07-02 NOTE — Progress Notes (Signed)
MEDICARE ANNUAL WELLNESS VISIT AND FOLLOW UP  Assessment:    Medicare annual wellness visit, subsequent -due next year - advised covid 19 booster, flu booster - not available in office  Essential hypertension -well controlled currently -monitor at home -dash diet -exercise as tolerated   ASCVD (arteriosclerotic cardiovascular disease) -cont to maximize cholesterol management and BP management   Obstructive sleep apnea -emphasized adherence with CPAP    Allergic rhinitis, unspecified allergic rhinitis type -cont meds - suggested she rotate antihistamine q3-6 months  Lifestyle controlled diabetes ([place]) Discussed general issues about diabetes pathophysiology and management., Educational material distributed., Suggested low cholesterol diet., Encouraged aerobic exercise., Discussed foot care., Reminded to get yearly retinal exam.  - Hemoglobin A1c at routine OVs in office q29m  Hypothyroidism, unspecified hypothyroidism type -check TSH level, continue medications the same, reminded to take on an empty stomach 30-61ms before food.  - TSH   Hyperlipidemia associated with T2DM (HCC) - statin intolerant, on fenofibrate and lopid with recent increased dose -continue medications, check lipids, decrease fatty foods, increase activity.  - Lipid panel at q3m70m  CKD 2 associated with T2DM (HCC) Increase fluids, avoid NSAIDS, monitor sugars, will monitor  Vitamin D deficiency -cont supplement  Medication management - CBC, CMP/GFR, magnesium routinely  Gastroesophageal reflux disease, esophagitis presence not specified -cont meds  Crohn's disease with complication, unspecified gastrointestinal tract location ([facility] MedEarlean Shawlmproved with humira   IBS (irritable colon syndrome) -followed by Dr. MedEarlean Shawlibromyalgia -followed by Dr. HarMaryjean KaRESTLESS LEG SYNDROME -followed by Dr. HarMaryjean Kaontinue requip   Depression, remission partial, recurrent  (HCCWaikoloa Villagecont current meds   Cervical pain -followed by neurosurgery  Spinal stenosis in cervical region -followed by neurosurgery  Anemia due to vitamin B12 deficiency, unspecified B12 deficiency type Continue supplement  Insomnia, unspecified type Sleep hygiene discussed  Osteopenia - get dexa, continue Vit D and Ca, weight bearing exercises  Overweight  Long discussion about weight loss, diet, and exercise Discussed goal weight and initial weight loss goal (<170lb) Patient will work on limiting access to unhealthy snacking foods in the evening, have easily accessible "healthy" options  Bronchiectasis without complication (HCCBrushy Creekonitor; continue guaifenacin  High risk falls/unsteady gait Has seen PT, continue reduce sedating medications, in process of workup with neuro, ortho follows  Polypharmacy Reviewed and discontinued several supplements Has  . Continue regular med reviews and d/c meds when possible.   Over 30 minutes of exam, counseling, chart review, and critical decision making was performed  Future Appointments  Date Time Provider DepRamsey/03/2021 11:30 AM GI-BCG MM 3 GI-BCGMM GI-BREAST CE  07/09/2020 12:00 PM GI-BCG DX DEXA 1 GI-BCGDG GI-BREAST CE  07/10/2020 11:00 AM GI-315 MR 2 GI-315MRI GI-315 W. WE  04/08/2021 10:00 AM McKUnk PintoD GAAM-GAAIM None    Plan:   During the course of the visit the patient was educated and counseled about appropriate screening and preventive services including:    Pneumococcal vaccine   Influenza vaccine  Td vaccine  Prevnar 13  Screening electrocardiogram  Screening mammography  Bone densitometry screening  Colorectal cancer screening  Diabetes screening  Glaucoma screening  Nutrition counseling   Advanced directives: given info/requested copies   Subjective:   PatAdrine Hayworth a [age]o. female who presents for Medicare Annual Wellness Visit and 3 month OV for  diabetes, htn, hyperlipidemia, CKD, vitamin D.  Patient has OSA & does not use her CPAP/BiPAP, she is aware of need,  states will work on compliance.   She has a Chronic Pain Syndrome consequent of multiple Neck & back surgeries & followed by [place] neuro surgical - tramadol, takes intermittently. She is on cymbalta for choronic pain, wellbutrin for mood and focus. Reports some seasonal down days but overall improved since son passed back in 2012. She also takes gabapentin 300 mg TID (reduced dose, had confusion with higher dose).   She has frequent falls, impaired gait and complex ortho hx, patient was seen by [facility] neurologist Dr. Orvilla Fus and in process of workup with EMG/NCV and MRI.   On oxybutynin 5 mg BID due to OAB/ICS with benefit, hasn't been able to tolerate tapering per patient.   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   She has Crohn's managed by Dr. Earlean Shawl, now on humira without recent flares.   BMI is Body mass index is 28.9 kg/m., she has been working on diet, Exercise is limited due to ongoing multiple orthopedic concerns has had multiple falls this past year, neuro and ortho following, has seen PT, doing exercises athome  Wt Readings from Last 3 Encounters:  07/04/20 158 lb (71.7 kg)  06/07/20 158 lb 12.8 oz (72 kg)  05/17/20 159 lb 9.6 oz (72.4 kg)   Her blood pressure has been controlled at home, today their BP is BP: 112/74 She does not workout, she has upcoming shoulder replacement, seeing specialist.   She denies chest pain, shortness of breath, dizziness.   She is on cholesterol medication (fenofibrate, lopid was increased to BID, hx of statin intolerance) and denies myalgias. Her cholesterol is not at goal of LDL <70. The cholesterol last visit was:   Lab Results  Component Value Date   CHOL 196 03/20/2020   HDL 45 (L) 03/20/2020   LDLCALC 117 (H) 03/20/2020   TRIG 222 (H) 03/20/2020   CHOLHDL 4.4 03/20/2020    She has been working on diet for lifestyle controlled diabetes (A1C 6.6%, 6.7% in 2016), and denies foot ulcerations, increased appetite, nausea, paresthesia of the feet, polydipsia, polyuria, visual disturbances, vomiting and weight loss.  Last A1C in the office was:  Lab Results  Component Value Date   HGBA1C 6.0 (H) 03/20/2020   She has CKD II associated with lifestyle controlled diabetes, on olmesartan:  Lab Results  Component Value Date   GFRNONAA 84 03/20/2020   Patient is on Vitamin D supplement, at goal last visit.  Lab Results  Component Value Date   VD25OH 60 03/20/2020     She is on thyroid medication. Her medication was not changed last visit. Taking 100 mcg tab daily.   Lab Results  Component Value Date   TSH 1.20 03/20/2020  .     Medication Review Current Outpatient Medications on File Prior to Visit  Medication Sig  . acyclovir (ZOVIRAX) 400 MG tablet Take 400 mg by mouth as needed (flare ups).  . Adalimumab 40 MG/0.4ML PNKT Inject 40 mg into the skin every 14 (fourteen) days.   [name] ascorbic acid (VITAMIN C) 100 MG tablet Take 100 mg by mouth daily. "Gummy"  . bisoprolol (ZEBETA) 10 MG tablet Take    1 tablet    Daily       for BP (Patient taking differently: Take 10 mg by mouth daily.)  . budesonide (ENTOCORT EC) 3 MG 24 hr capsule Take by mouth.  . calcium carbonate (OS-CAL) 1250 (500 Ca) MG chewable tablet Chew 1 tablet by mouth daily.  [name]  Cholecalciferol 125 MCG (5000 UT) TABS Take by mouth.  . clotrimazole (MYCELEX) 10 MG troche Take 1 tablet by mouth daily.  . Cyanocobalamin (B-12 SL) Place 1 each under the tongue daily.   . diclofenac Sodium (VOLTAREN) 1 % GEL Apply 4 g topically 4 (four) times daily.  [name] DIGESTIVE ENZYMES PO Take 1 each by mouth daily.  . DULoxetine (CYMBALTA) 60 MG capsule Take 1 capsule Daily for Chronic Pain (Patient taking differently: Take 60 mg by mouth daily.)  . famotidine (PEPCID) 40 MG tablet Take 40 mg by mouth daily.  .  fenofibrate micronized (LOFIBRA) 134 MG capsule Take     1 capsule     Daily       for Triglycerides (Blood Fats)  . fluconazole (DIFLUCAN) 150 MG tablet Take 150 mg by mouth See admin instructions. Sundays and Thursday  . fluticasone (FLONASE) 50 MCG/ACT nasal spray USE TWO SPRAYS IN EACH NOSTRIL DAILY AS NEEDED (Patient taking differently: Place 2 sprays into both nostrils daily as needed for allergies.)  . gabapentin (NEURONTIN) 300 MG capsule TAKE 1 CAPSULE BY MOUTH 3 TIMES A DAY FOR CHRONIC PAIN (Patient taking differently: Take 300 mg by mouth 3 (three) times daily.)  . gemfibrozil (LOPID) 600 MG tablet Take     1 tablet     2 x /day     with Meals      for Cholesterol & Triglycerides (Patient taking differently: Take     1 tablet     2 x /day     with Meals      for Cholesterol & Triglycerides)  . hydrocortisone 2.5 % ointment in the morning and at bedtime.  [name] levothyroxine (SYNTHROID) 100 MCG tablet TAKE 1 TABLET BY MOUTH ON TUES, WED, THURS, SAT AND SUN. TAKE 1& 1/2 TABLET ON MON AND FRI (Patient taking differently: Take 100 mcg by mouth daily.)  . meclizine (ANTIVERT) 25 MG tablet TAKE 1 TABLET (25 MG TOTAL) BY MOUTH 3 (THREE) TIMES DAILY AS NEEDED FOR DIZZINESS.  [name] metroNIDAZOLE (FLAGYL) 500 MG tablet Take 500 mg by mouth in the morning and at bedtime.   . multivitamin (VIT W/EXTRA C) CHEW chewable tablet Chew 1 tablet by mouth daily.  [name] nystatin (MYCOSTATIN) 100000 UNIT/ML suspension Take 15 mLs by mouth daily as needed (tooth infections).   . olmesartan (BENICAR) 40 MG tablet Take     1/2 to 1 tablet      at Night       for BP  . oxybutynin (DITROPAN) 5 MG tablet Take 1 tablet 2 x /day for Bladder Control (Patient taking differently: Take 5 mg by mouth daily.)  . rOPINIRole (REQUIP) 2 MG tablet TAKE 1 TABLET FOUR TIMES A DAY FOR RESTLESS LEGS (Patient taking differently: Take 2 mg by mouth in the morning, at noon, in the evening, and at bedtime.)   No current facility-administered  medications on file prior to visit.     Current Problems (verified) Patient Active Problem List   Diagnosis Date Noted  . CKD stage 2 due to type 2 diabetes mellitus ([place]) 11/16/2019  . COVID-19 virus infection 10/19/2019  . Presence of artificial knee joint, right 09/20/2019  . History of total knee arthroplasty, right 05/17/2019  . Pain due to total right knee replacement ([place]) 05/17/2019  . History of recurrent TIAs 11/15/2018  . Rupture of popliteal cyst of right knee region (complex cyst) 11/03/2018  . Diet-controlled type 2 diabetes mellitus ([place]) 07/04/2018  . History  of adenomatous polyp of colon 10/18/2017  . Bronchiectasis without complication ([place]) 83/41/9622  . Diverticulosis 10/12/2017  . Internal hemorrhoids 10/12/2017  . Polypharmacy 04/09/2017  . Arthritis of carpometacarpal (CMC) joints of both thumbs 02/12/2017  . Cervical disc disease 01/23/2017  . Constipation 01/23/2017  . Crohn's ileitis ([place]) 01/23/2017  . S/P revision of total knee, left 12/18/2016  . B12 deficiency anemia 06/16/2016  . Sleep talking 03/26/2016  . IBS (irritable colon syndrome) 08/09/2015  . Spinal stenosis in cervical region 06/14/2015  . Other specified arthropathy involving shoulder region 11/23/2014  . Obesity (BMI 30.0-34.9) 10/31/2014  . Medication management 10/25/2013  . Rotator cuff arthropathy 09/15/2013  . Hyperlipidemia associated with type 2 diabetes mellitus ([place])   . Hypothyroid   . Depression, major, recurrent, in partial remission ([place])   . Vitamin D deficiency   . ASCVD   . Gait disorder 10/26/2012  . Knee pain 09/07/2012  . Rotator cuff tear 08/23/2012  . Bilateral bunions 11/19/2011  . Rhinitis 06/25/2010  . Obstructive sleep apnea 06/21/2010  . RESTLESS LEG SYNDROME 06/21/2010  . Essential hypertension 06/21/2010  . GERD 06/21/2010  . Crohn's Enterocolitis / Regional enteritis 06/21/2010  . Fibromyalgia 06/21/2010    Screening Tests Immunization History   Administered Date(s) Administered  . DT (Pediatric) 01/03/2015  . Influenza Split 03/31/2011, 05/14/2014, 02/27/2016, 04/16/2018  . Influenza Whole 03/30/2010  . Influenza, High Dose Seasonal PF 03/13/2015, 07/21/2019, 02/16/2020  . Influenza-Unspecified 03/31/2011, 05/14/2014, 02/27/2016  . PFIZER SARS-COV-2 Vaccination 09/19/2019, 10/10/2019  . Pneumococcal Conjugate-13 06/30/2005, 04/17/2015, 01/23/2019  . Pneumococcal Polysaccharide-23 01/06/2017  . Pneumococcal-Unspecified 06/30/2005  . Td 06/30/2004  . Zoster 07/01/2007    Preventative care: Last colonoscopy: Dr. Earlean Shawl, 09/2017 Last mammogram: 05/2019 has scheduled next week  DEXA: 03/2018 T -1.2 , follow up due 03/2020, order entered, patient has scheduled next week Sleep study 04/2016    Prior vaccinations: TD or Tdap: 2016 Influenza: 01/2020 - advised booster  Pneumococcal: 2018 Prevnar13: 2016 Shingles/Zostavax: 2009 covid 19: 2/2, 2021, advised booster  Names of Other Physician/Practitioners you currently use: 1. [facility]- here for primary care 2. Dr. Nori Riis ophthalmology, eye doctor, last visit 2021 3. Dr. Mariea Clonts, dentist, last visit 2021 q 6 months   Patient Care Team: Unk Pinto, MD as PCP - General (Internal Medicine) Star Age, MD as Attending Physician (Neurology) Rozetta Nunnery, MD as Consulting Physician (Otolaryngology) Richmond Campbell, MD as Consulting Physician (Gastroenterology) Erline Levine, MD as Consulting Physician (Neurosurgery) Katy Apo, MD as Consulting Physician (Ophthalmology) Crista Luria, MD as Consulting Physician (Dermatology) Vickey Huger, MD as Consulting Physician (Orthopedic Surgery) Valeta Harms, MD as Referring Physician (Orthopedic Surgery) Schulenklopper, Rozanna Boer, PT as Physical Therapist (Physical Therapy)  Allergies Allergies  Allergen Reactions  . Atorvastatin Other (See Comments)    Extreme  pain  . Codeine Other (See Comments)    Headache  . Pravastatin Other (See Comments)    Exreme pain  . Statins Other (See Comments)    Extreme pain  . Amitriptyline Other (See Comments)    Unspecified  . Fish Oil Nausea Only  . Hydrocodone Other (See Comments)    Itching Other Reaction: Other reaction   . Linseed Oil Other (See Comments)    Other Reaction: Other reaction  . Nuvigil [Armodafinil] Other (See Comments)    Unspecified  . Sulfa Antibiotics Other (See Comments)  . Erythromycin Other (See Comments)    Reaction unspecified  . Erythromycin Base Rash  . Flax  Seed [Bio-Flax] Rash    Linseed  . Hydrocodone-Acetaminophen Rash  . Morphine Nausea And Vomiting and Rash  . Nsaids Nausea Only and Other (See Comments)    Other Reaction: Other reaction  . Sertraline Rash  . Sinemet [Carbidopa W-Levodopa] Rash  . Sulfamethoxazole Rash  . Tolmetin Nausea Only    SURGICAL HISTORY She  has a past surgical history that includes Shoulder adhesion release; Spine surgery; Appendectomy (1960); Cesarean section ([place]); Abdominal hysterectomy (1992); Rotator cuff repair (Left, 2005); Tonsillectomy (1960); Joint replacement; Toe Surgery; Toe Surgery; Anterior cervical decompression/discectomy fusion 4 level (N/A, 06/14/2015); ORIF femur fracture (Left, 03/24/2017); and Knee surgery. FAMILY HISTORY Her family history includes Breast cancer in her mother; Cancer in her mother; Depression in her sister; Diabetes in her mother; Heart disease in her father; Hyperlipidemia in her father; Hypertension in her father; Leukemia in her sister; Lupus in her sister; Migraines in her daughter; Neuropathy in her father; Osteoporosis in her mother; Rheum arthritis in her sister. SOCIAL HISTORY She  reports that she has never smoked. She has never used smokeless tobacco. She reports current alcohol use. She reports that she does not use drugs.  MEDICARE WELLNESS OBJECTIVES: Physical activity: Current  Exercise Habits: The patient does not participate in regular exercise at present, Exercise limited by: orthopedic condition(s);neurologic condition(s) Cardiac risk factors: Cardiac Risk Factors include: advanced age (>2mn, >>60women);dyslipidemia;hypertension;diabetes mellitus;sedentary lifestyle Depression/mood screen:   Depression screen P[facility]2/9 07/04/2020  Decreased Interest 0  Down, Depressed, Hopeless 1  PHQ - 2 Score 1  Altered sleeping -  Tired, decreased energy -  Change in appetite -  Feeling bad or failure about yourself  -  Trouble concentrating -  Moving slowly or fidgety/restless -  Suicidal thoughts -  PHQ-9 Score -  Difficult doing work/chores -  Some recent data might be hidden    ADLs:  In your present state of health, do you have any difficulty performing the following activities: 07/04/2020 11/16/2019  Hearing? N N  Vision? N N  Difficulty concentrating or making decisions? Y N  Comment chronic difficulty focusing, unchanged for several years -  Walking or climbing stairs? Y Y  Comment complex ortho, uses rolling walker some stairs, chronic ortho problems, unstable gait  Dressing or bathing? N N  Doing errands, shopping? N N  Preparing Food and eating ? N -  Using the Toilet? N -  In the past six months, have you accidently leaked urine? N -  Do you have problems with loss of bowel control? N -  Managing your Medications? N -  Managing your Finances? N -  Housekeeping or managing your Housekeeping? N -  Comment husband helps when needed -  Some recent data might be hidden     Cognitive Testing  Alert? Yes  Normal Appearance?Yes  Oriented to person? Yes  Place? Yes   Time? Yes  Recall of three objects?  Yes  Can perform simple calculations? Yes  Displays appropriate judgment?Yes  Can read the correct time from a watch face?Yes  EOL planning: Does Patient Have a Medical Advance Directive?: Yes Type of Advance Directive: [place]will Does patient want to make changes to medical advance directive?: No - Patient declined Copy of HB[place]in Chart?: No - copy requested   Objective:   Today's Vitals   07/04/20 1403  BP: 112/74  Pulse: 83  Temp: (!) 97.1 F (36.2 C)  SpO2: 98%  Weight:  158 lb (71.7 kg)  Height: 5' 2"  (1.575 m)   Body mass index is 28.9 kg/m.  General Appearance: Well nourished, elderly female, with walker, in no acute distress. Eyes: PERRLA, EOMs, conjunctiva no swelling or erythema. Chronically drooping R eye lid.  Sinuses: No Frontal/maxillary tenderness ENT/Mouth: Ext aud canals clear, TMs without erythema, bulging. No erythema, swelling, or exudate on post pharynx.  Tonsils not swollen or erythematous. Hearing normal.  Neck: Supple, thyroid normal.  Respiratory: Respiratory effort normal, BS equal bilaterally without rales, rhonchi, wheezing or stridor.  Cardio: RRR with no MRGs. Brisk peripheral pulses without edema.  Abdomen: Soft, + BS.  Non tender, no guarding, rebound, hernias, masses. Lymphatics: Non tender without lymphadenopathy.  Musculoskeletal: No obvious or palpable bony abnormality, slow gait with walker, has chronic R knee effusion consistent with baseline, mildly warm to touch without erythema Skin: Warm, dry without rashes, lesions; she has ecchymosis surrounding R eye, cheek; mild bruising to right inner breast without palpable abnormality, mild ecchymoses to left lower back/upper hip in linear pattern, no settling ecchymosis of flank area Neuro:  Normal muscle tone, Sensation intact.  Psych: Awake and oriented X 3, normal affect, Insight and Judgment appropriate.   Medicare Attestation I have personally reviewed: The patient's medical and social history Their use of alcohol, tobacco or illicit drugs Their current medications and supplements The patient's functional ability including ADLs,fall risks, home safety risks, cognitive, and  hearing and visual impairment Diet and physical activities Evidence for depression or mood disorders  The patient's weight, height, BMI, and visual acuity have been recorded in the chart.  I have made referrals, counseling, and provided education to the patient based on review of the above and I have provided the patient with a written personalized care plan for preventive services.     Izora Ribas, NP   07/04/2020

## 2020-07-04 ENCOUNTER — Encounter: Payer: Self-pay | Admitting: Adult Health

## 2020-07-04 ENCOUNTER — Other Ambulatory Visit: Payer: Self-pay

## 2020-07-04 ENCOUNTER — Ambulatory Visit: Payer: Medicare PPO | Admitting: Adult Health

## 2020-07-04 VITALS — BP 112/74 | HR 83 | Temp 97.1°F | Ht 62.0 in | Wt 158.0 lb

## 2020-07-04 DIAGNOSIS — I2510 Atherosclerotic heart disease of native coronary artery without angina pectoris: Secondary | ICD-10-CM | POA: Diagnosis not present

## 2020-07-04 DIAGNOSIS — E119 Type 2 diabetes mellitus without complications: Secondary | ICD-10-CM

## 2020-07-04 DIAGNOSIS — R6889 Other general symptoms and signs: Secondary | ICD-10-CM

## 2020-07-04 DIAGNOSIS — E559 Vitamin D deficiency, unspecified: Secondary | ICD-10-CM

## 2020-07-04 DIAGNOSIS — K219 Gastro-esophageal reflux disease without esophagitis: Secondary | ICD-10-CM

## 2020-07-04 DIAGNOSIS — E785 Hyperlipidemia, unspecified: Secondary | ICD-10-CM

## 2020-07-04 DIAGNOSIS — G4733 Obstructive sleep apnea (adult) (pediatric): Secondary | ICD-10-CM | POA: Diagnosis not present

## 2020-07-04 DIAGNOSIS — J479 Bronchiectasis, uncomplicated: Secondary | ICD-10-CM | POA: Diagnosis not present

## 2020-07-04 DIAGNOSIS — Z79899 Other long term (current) drug therapy: Secondary | ICD-10-CM

## 2020-07-04 DIAGNOSIS — E1122 Type 2 diabetes mellitus with diabetic chronic kidney disease: Secondary | ICD-10-CM

## 2020-07-04 DIAGNOSIS — K5000 Crohn's disease of small intestine without complications: Secondary | ICD-10-CM | POA: Diagnosis not present

## 2020-07-04 DIAGNOSIS — Z0001 Encounter for general adult medical examination with abnormal findings: Secondary | ICD-10-CM | POA: Diagnosis not present

## 2020-07-04 DIAGNOSIS — N182 Chronic kidney disease, stage 2 (mild): Secondary | ICD-10-CM

## 2020-07-04 DIAGNOSIS — R269 Unspecified abnormalities of gait and mobility: Secondary | ICD-10-CM

## 2020-07-04 DIAGNOSIS — I10 Essential (primary) hypertension: Secondary | ICD-10-CM | POA: Diagnosis not present

## 2020-07-04 DIAGNOSIS — G2581 Restless legs syndrome: Secondary | ICD-10-CM

## 2020-07-04 DIAGNOSIS — D513 Other dietary vitamin B12 deficiency anemia: Secondary | ICD-10-CM

## 2020-07-04 DIAGNOSIS — F3341 Major depressive disorder, recurrent, in partial remission: Secondary | ICD-10-CM

## 2020-07-04 DIAGNOSIS — E039 Hypothyroidism, unspecified: Secondary | ICD-10-CM

## 2020-07-04 DIAGNOSIS — E1169 Type 2 diabetes mellitus with other specified complication: Secondary | ICD-10-CM | POA: Diagnosis not present

## 2020-07-04 DIAGNOSIS — Z0000 Encounter for general adult medical examination without abnormal findings: Secondary | ICD-10-CM

## 2020-07-04 DIAGNOSIS — K648 Other hemorrhoids: Secondary | ICD-10-CM | POA: Diagnosis not present

## 2020-07-04 DIAGNOSIS — M797 Fibromyalgia: Secondary | ICD-10-CM

## 2020-07-04 DIAGNOSIS — E66811 Obesity, class 1: Secondary | ICD-10-CM

## 2020-07-04 DIAGNOSIS — K5900 Constipation, unspecified: Secondary | ICD-10-CM

## 2020-07-04 DIAGNOSIS — E669 Obesity, unspecified: Secondary | ICD-10-CM

## 2020-07-04 MED ORDER — BUPROPION HCL ER (XL) 150 MG PO TB24: ORAL_TABLET | ORAL | 0 refills | Status: DC

## 2020-07-04 NOTE — Patient Instructions (Signed)
  Crystal [name] , Thank you for taking time to come for your Medicare Wellness Visit. I appreciate your ongoing commitment to your health goals. Please review the following plan we discussed and let me know if I can assist you in the future.   These are the goals we discussed: Goals    . Exercise 150 min/wk Moderate Activity    . HEMOGLOBIN A1C < 5.7    . Weight (lb) < 170 lb (77.1 kg)       This is a list of the screening recommended for you and due dates:  Health Maintenance  Topic Date Due  . Eye exam for diabetics  12/10/2017  . COVID-19 Vaccine (3 - Booster for Pfizer series) 04/10/2020  . Hemoglobin A1C  09/17/2020  . Complete foot exam   03/19/2021  . Mammogram  05/11/2021  . Tetanus Vaccine  01/02/2025  . Colon Cancer Screening  10/14/2027  . Flu Shot  Completed  . DEXA scan (bone density measurement)  Completed  .  Hepatitis C: One time screening is recommended by Center for Disease Control  (CDC) for  adults born from [birth year] through 1967.   Completed  . Pneumonia vaccines  Completed

## 2020-07-05 ENCOUNTER — Encounter: Payer: Self-pay | Admitting: Adult Health

## 2020-07-05 ENCOUNTER — Other Ambulatory Visit: Payer: Self-pay | Admitting: Adult Health

## 2020-07-05 DIAGNOSIS — I700 Atherosclerosis of aorta: Secondary | ICD-10-CM | POA: Insufficient documentation

## 2020-07-05 LAB — CBC WITH DIFFERENTIAL/PLATELET
Absolute Monocytes: 735 cells/uL (ref 200–950)
Basophils Absolute: 78 cells/uL (ref 0–200)
Basophils Relative: 1.2 %
Eosinophils Absolute: 169 cells/uL (ref 15–500)
Eosinophils Relative: 2.6 %
HCT: 44.9 % (ref 35.0–45.0)
Hemoglobin: 15.2 g/dL (ref 11.7–15.5)
Lymphs Abs: 1216 cells/uL (ref 850–3900)
MCH: 31.3 pg (ref 27.0–33.0)
MCHC: 33.9 g/dL (ref 32.0–36.0)
MCV: 92.6 fL (ref 80.0–100.0)
MPV: 9.4 fL (ref 7.5–12.5)
Monocytes Relative: 11.3 %
Neutro Abs: 4303 cells/uL (ref 1500–7800)
Neutrophils Relative %: 66.2 %
Platelets: 297 10*3/uL (ref 140–400)
RBC: 4.85 10*6/uL (ref 3.80–5.10)
RDW: 12.4 % (ref 11.0–15.0)
Total Lymphocyte: 18.7 %
WBC: 6.5 10*3/uL (ref 3.8–10.8)

## 2020-07-05 LAB — COMPLETE METABOLIC PANEL WITH GFR
AG Ratio: 1.8 (calc) (ref 1.0–2.5)
ALT: 16 U/L (ref 6–29)
AST: 21 U/L (ref 10–35)
Albumin: 4.6 g/dL (ref 3.6–5.1)
Alkaline phosphatase (APISO): 59 U/L (ref 37–153)
BUN: 20 mg/dL (ref 7–25)
CO2: 29 mmol/L (ref 20–32)
Calcium: 9.9 mg/dL (ref 8.6–10.4)
Chloride: 103 mmol/L (ref 98–110)
Creat: 0.75 mg/dL (ref 0.60–0.93)
GFR, Est African American: 93 mL/min/{1.73_m2} (ref >=60)
GFR, Est Non African American: 80 mL/min/{1.73_m2} (ref >=60)
Globulin: 2.6 g/dL (calc) (ref 1.9–3.7)
Glucose, Bld: 86 mg/dL (ref 65–99)
Potassium: 4 mmol/L (ref 3.5–5.3)
Sodium: 143 mmol/L (ref 135–146)
Total Bilirubin: 0.6 mg/dL (ref 0.2–1.2)
Total Protein: 7.2 g/dL (ref 6.1–8.1)

## 2020-07-05 LAB — LIPID PANEL
Cholesterol: 187 mg/dL (ref <200)
HDL: 41 mg/dL — ABNORMAL LOW (ref >=50)
LDL Cholesterol (Calc): 114 mg/dL (calc) — ABNORMAL HIGH
Non-HDL Cholesterol (Calc): 146 mg/dL (calc) — ABNORMAL HIGH (ref <130)
Total CHOL/HDL Ratio: 4.6 (calc) (ref <5.0)
Triglycerides: 208 mg/dL — ABNORMAL HIGH (ref <150)

## 2020-07-05 LAB — HEMOGLOBIN A1C
Hgb A1c MFr Bld: 5.9 % of total Hgb — ABNORMAL HIGH (ref <5.7)
Mean Plasma Glucose: 123 mg/dL
eAG (mmol/L): 6.8 mmol/L

## 2020-07-05 LAB — MAGNESIUM: Magnesium: 2.4 mg/dL (ref 1.5–2.5)

## 2020-07-05 LAB — TSH: TSH: 0.46 mIU/L (ref 0.40–4.50)

## 2020-07-05 MED ORDER — NEXLETOL 180 MG PO TABS: 1.0000 | ORAL_TABLET | Freq: Every day | ORAL | 0 refills | Status: DC

## 2020-07-06 ENCOUNTER — Other Ambulatory Visit: Payer: Self-pay | Admitting: Internal Medicine

## 2020-07-06 DIAGNOSIS — L438 Other lichen planus: Secondary | ICD-10-CM | POA: Diagnosis not present

## 2020-07-06 DIAGNOSIS — B379 Candidiasis, unspecified: Secondary | ICD-10-CM | POA: Diagnosis not present

## 2020-07-09 ENCOUNTER — Ambulatory Visit
Admission: RE | Admit: 2020-07-09 | Discharge: 2020-07-09 | Disposition: A | Discharge status: Home / Self Care | Payer: Medicare PPO | Source: Ambulatory Visit | Attending: Adult Health | Admitting: Adult Health

## 2020-07-09 ENCOUNTER — Other Ambulatory Visit: Payer: Medicare PPO

## 2020-07-09 ENCOUNTER — Other Ambulatory Visit: Payer: Self-pay

## 2020-07-09 DIAGNOSIS — Z1231 Encounter for screening mammogram for malignant neoplasm of breast: Secondary | ICD-10-CM

## 2020-07-10 ENCOUNTER — Ambulatory Visit
Admission: RE | Admit: 2020-07-10 | Discharge: 2020-07-10 | Disposition: A | Discharge status: Home / Self Care | Payer: Medicare PPO | Source: Ambulatory Visit | Attending: Neurosurgery | Admitting: Neurosurgery

## 2020-07-10 ENCOUNTER — Other Ambulatory Visit: Payer: Self-pay | Admitting: Internal Medicine

## 2020-07-10 DIAGNOSIS — M5412 Radiculopathy, cervical region: Secondary | ICD-10-CM

## 2020-07-10 DIAGNOSIS — M4802 Spinal stenosis, cervical region: Secondary | ICD-10-CM | POA: Diagnosis not present

## 2020-07-10 DIAGNOSIS — M4322 Fusion of spine, cervical region: Secondary | ICD-10-CM | POA: Diagnosis not present

## 2020-07-10 DIAGNOSIS — R42 Dizziness and giddiness: Secondary | ICD-10-CM | POA: Diagnosis not present

## 2020-07-10 DIAGNOSIS — M4319 Spondylolisthesis, multiple sites in spine: Secondary | ICD-10-CM | POA: Diagnosis not present

## 2020-07-18 DIAGNOSIS — M48061 Spinal stenosis, lumbar region without neurogenic claudication: Secondary | ICD-10-CM | POA: Diagnosis not present

## 2020-07-18 DIAGNOSIS — M5416 Radiculopathy, lumbar region: Secondary | ICD-10-CM | POA: Diagnosis not present

## 2020-07-18 DIAGNOSIS — M47812 Spondylosis without myelopathy or radiculopathy, cervical region: Secondary | ICD-10-CM | POA: Diagnosis not present

## 2020-07-18 DIAGNOSIS — R2689 Other abnormalities of gait and mobility: Secondary | ICD-10-CM | POA: Diagnosis not present

## 2020-07-23 ENCOUNTER — Encounter: Payer: Self-pay | Admitting: Neurology

## 2020-07-25 ENCOUNTER — Telehealth: Payer: Self-pay | Admitting: Neurology

## 2020-07-25 NOTE — Telephone Encounter (Signed)
Dr. Frances Furbish could you review? We worked pt up back in 2021 for gait disorder and advised pt to f/u PCP for Spine Specialist referral.   Is there additional testing that can be offered?

## 2020-07-25 NOTE — Telephone Encounter (Signed)
Please advise patient that at this point I really do not have any additional recommendations or testing to offer from my end.  I would recommend that she follow-up with her neuromuscular specialist at [facility] and we can cancel the appointment in March with me.

## 2020-07-25 NOTE — Telephone Encounter (Signed)
I have reached out to the pt via my chart and relayed message from Dr. Rexene Alberts.

## 2020-07-25 NOTE — Telephone Encounter (Signed)
Pt called, would like to fax over test results from Dr. Idalia Needle for Dr. Rexene Alberts to review before my appt at [facility] on 09/06/20. Would like a call from the nurse.

## 2020-07-26 ENCOUNTER — Telehealth: Payer: Self-pay | Admitting: Neurology

## 2020-07-26 DIAGNOSIS — R296 Repeated falls: Secondary | ICD-10-CM

## 2020-07-26 DIAGNOSIS — R269 Unspecified abnormalities of gait and mobility: Secondary | ICD-10-CM

## 2020-07-26 DIAGNOSIS — R2689 Other abnormalities of gait and mobility: Secondary | ICD-10-CM

## 2020-07-26 NOTE — Telephone Encounter (Signed)
I do not mind ordering another MRI.  We did a CT scan of the head about a year ago but we can certainly look with more detail for a central nervous system cause of her gait disorder and balance problem with a brain MRI and also compare with findings from 2014.  Please advise patient that we will seek insurance authorization for this test and then she will be contacted to schedule this, we will call her with the test results once available.

## 2020-07-26 NOTE — Telephone Encounter (Signed)
Earlie Raveling: [id number] (exp. 07/26/20 to 08/25/20) order sent to GI. They will reach out to the patient to schedule.

## 2020-07-26 NOTE — Telephone Encounter (Signed)
I have reached out to the pt and advised per Dr. Rexene Alberts we have order the MRI.

## 2020-07-30 ENCOUNTER — Telehealth: Payer: Self-pay

## 2020-07-30 NOTE — Telephone Encounter (Signed)
Patient states that she is feeling very sluggish and tired. Thinks it may have to do with medications that she recently started but not sure which ones.

## 2020-07-31 ENCOUNTER — Other Ambulatory Visit: Payer: Self-pay

## 2020-07-31 DIAGNOSIS — J40 Bronchitis, not specified as acute or chronic: Secondary | ICD-10-CM

## 2020-07-31 MED ORDER — BENZONATATE 200 MG PO CAPS: ORAL_CAPSULE | ORAL | 1 refills | Status: DC

## 2020-07-31 NOTE — Telephone Encounter (Signed)
Patient states that she never picked up the samples of Nexlitol. She is planning on sending a MyChart message in regard to her meds.

## 2020-08-07 DIAGNOSIS — M7711 Lateral epicondylitis, right elbow: Secondary | ICD-10-CM | POA: Diagnosis not present

## 2020-08-09 DIAGNOSIS — H524 Presbyopia: Secondary | ICD-10-CM | POA: Diagnosis not present

## 2020-08-09 DIAGNOSIS — H40013 Open angle with borderline findings, low risk, bilateral: Secondary | ICD-10-CM | POA: Diagnosis not present

## 2020-08-09 DIAGNOSIS — G510 Bell's palsy: Secondary | ICD-10-CM | POA: Diagnosis not present

## 2020-08-09 DIAGNOSIS — H04121 Dry eye syndrome of right lacrimal gland: Secondary | ICD-10-CM | POA: Diagnosis not present

## 2020-08-11 ENCOUNTER — Other Ambulatory Visit: Payer: Self-pay

## 2020-08-11 ENCOUNTER — Ambulatory Visit
Admission: RE | Admit: 2020-08-11 | Discharge: 2020-08-11 | Disposition: A | Discharge status: Home / Self Care | Payer: Medicare PPO | Source: Ambulatory Visit | Attending: Neurology | Admitting: Neurology

## 2020-08-11 DIAGNOSIS — R269 Unspecified abnormalities of gait and mobility: Secondary | ICD-10-CM

## 2020-08-11 DIAGNOSIS — R296 Repeated falls: Secondary | ICD-10-CM

## 2020-08-11 DIAGNOSIS — R2689 Other abnormalities of gait and mobility: Secondary | ICD-10-CM

## 2020-08-11 MED ORDER — GADOBENATE DIMEGLUMINE 529 MG/ML IV SOLN
15.0000 mL | Freq: Once | INTRAVENOUS | Status: AC | PRN
  Administered 2020-08-11: 15 mL via INTRAVENOUS

## 2020-08-13 DIAGNOSIS — M5416 Radiculopathy, lumbar region: Secondary | ICD-10-CM | POA: Diagnosis not present

## 2020-08-13 DIAGNOSIS — M961 Postlaminectomy syndrome, not elsewhere classified: Secondary | ICD-10-CM | POA: Diagnosis not present

## 2020-08-13 DIAGNOSIS — M47812 Spondylosis without myelopathy or radiculopathy, cervical region: Secondary | ICD-10-CM | POA: Diagnosis not present

## 2020-08-13 DIAGNOSIS — M48061 Spinal stenosis, lumbar region without neurogenic claudication: Secondary | ICD-10-CM | POA: Diagnosis not present

## 2020-08-13 NOTE — Progress Notes (Signed)
Please call patient and advise her that her brain MRI with and without contrast showed no acute findings, it showed her meningioma which has been there for some years.  Also, compared to her CT scan from June 2021, findings appear to be stable.  She is advised to follow-up with her primary care physician.  If she would like a consultation with a neurosurgeon regarding her meningioma, we can place a referral to neurosurgery.

## 2020-08-14 ENCOUNTER — Other Ambulatory Visit: Payer: Self-pay | Admitting: Internal Medicine

## 2020-08-14 DIAGNOSIS — H8113 Benign paroxysmal vertigo, bilateral: Secondary | ICD-10-CM

## 2020-08-15 ENCOUNTER — Other Ambulatory Visit: Payer: Self-pay | Admitting: Internal Medicine

## 2020-08-15 DIAGNOSIS — R42 Dizziness and giddiness: Secondary | ICD-10-CM | POA: Diagnosis not present

## 2020-08-15 DIAGNOSIS — M6281 Muscle weakness (generalized): Secondary | ICD-10-CM | POA: Diagnosis not present

## 2020-08-15 DIAGNOSIS — K5000 Crohn's disease of small intestine without complications: Secondary | ICD-10-CM | POA: Diagnosis not present

## 2020-08-16 ENCOUNTER — Telehealth: Payer: Self-pay | Admitting: Neurology

## 2020-08-16 NOTE — Telephone Encounter (Signed)
Pt.notified

## 2020-08-16 NOTE — Telephone Encounter (Signed)
Pt was able to review results of MRI in mychart. She message back via my chart stating she has seen Dr. Vertell Limber already and is needing to see a specialist to examine her gail and  I need a specialist who can help with examining her medicines. She also made mention of seeing a ENT.

## 2020-08-16 NOTE — Telephone Encounter (Signed)
Please see most recent MyChart message for reference.  At this juncture, I recommend that she talk to her primary care physician about her additional concerns and referrals to another specialist for evaluation.

## 2020-08-20 DIAGNOSIS — M19049 Primary osteoarthritis, unspecified hand: Secondary | ICD-10-CM | POA: Diagnosis not present

## 2020-08-20 DIAGNOSIS — M180 Bilateral primary osteoarthritis of first carpometacarpal joints: Secondary | ICD-10-CM | POA: Diagnosis not present

## 2020-08-20 DIAGNOSIS — G5601 Carpal tunnel syndrome, right upper limb: Secondary | ICD-10-CM | POA: Diagnosis not present

## 2020-08-21 ENCOUNTER — Encounter (INDEPENDENT_AMBULATORY_CARE_PROVIDER_SITE_OTHER): Payer: Self-pay | Admitting: Otolaryngology

## 2020-08-21 ENCOUNTER — Other Ambulatory Visit: Payer: Self-pay

## 2020-08-21 ENCOUNTER — Ambulatory Visit (INDEPENDENT_AMBULATORY_CARE_PROVIDER_SITE_OTHER): Payer: Medicare PPO | Admitting: Otolaryngology

## 2020-08-21 VITALS — Temp 97.3°F

## 2020-08-21 DIAGNOSIS — J310 Chronic rhinitis: Secondary | ICD-10-CM

## 2020-08-21 DIAGNOSIS — R42 Dizziness and giddiness: Secondary | ICD-10-CM | POA: Diagnosis not present

## 2020-08-21 NOTE — Progress Notes (Signed)
HPI: Crystal [name] is a 70 y.o. female who returns today for evaluation of chronic problems with dizziness.  She has fallen several times since last summer.  She has seen neurology and underwent neurologic testing that showed some generalized neurologic weakness as well as some specific weakness related to radiculopathy of cervical nerve in the neck as well as carpal tunnel in the wrist.  She also had an MRI scan of her head that showed some overall cerebellar atrophy but no specific lesions except for a small meningioma.  But no cochlear or retrocochlear pathology noted on the MRI scan. She also complains of nasal congestion which alternates from side to side left side generally worse but doing much better today.  She also hears a high-pitched noise in her ears and sometimes a swishing sound in her ears.  She is also had head and neck pain I suspect is related more to cervical problems.  Past Medical History:  Diagnosis Date  . Arthritis   . ASCVD (arteriosclerotic cardiovascular disease)   . Closed nondisplaced subtrochanteric fracture of left femur ([place]) 09/03/2017   September 2018.  Status post surgery by Dr. Marcelino Scot  . Depression   . Fibromyalgia   . Gait disorder 10/26/2012  . GERD (gastroesophageal reflux disease)   . GI bleed   . Hyperlipidemia   . Hypertension   . Hypothyroid   . Periprosthetic supracondylar fracture of femur, left  03/23/2017  . Restless leg syndrome   . Rhinitis 06/25/2010  . Sleep apnea   . TIA (transient ischemic attack)    3        . Vitamin D deficiency    Past Surgical History:  Procedure Laterality Date  . ABDOMINAL HYSTERECTOMY  1992  . ANTERIOR CERVICAL DECOMPRESSION/DISCECTOMY FUSION 4 LEVELS N/A 06/14/2015   Procedure: Cervical three-four Cervical four-five Cervical five- six Cervical six- seven  Anterior cervical decompression/diskectomy/fusion;  Surgeon: Erline Levine, MD;  Location: [facility];  Service: Neurosurgery;  Laterality: N/A;   C3-4 C4-5 C5-6 C6-7 Anterior cervical decompression/diskectomy/fusion  . APPENDECTOMY  1960  . [place]  . JOINT REPLACEMENT     2   LEFT  1 RIGHT   . KNEE SURGERY    . ORIF FEMUR FRACTURE Left 03/24/2017   Procedure: OPEN REDUCTION INTERNAL FIXATION (ORIF) FEMUR FRACTURE;  Surgeon: Altamese Vandalia, MD;  Location: [facility];  Service: Orthopedics;  Laterality: Left;  . ROTATOR CUFF REPAIR Left 2005   2 LEFT  1  RIGHT  . SHOULDER ADHESION RELEASE    . SPINE SURGERY    . TOE SURGERY     LEFT       . TOE SURGERY     RIGHT  BIG TOE  . TONSILLECTOMY  1960   Social History   Socioeconomic History  . Marital status: Married    Spouse name: Not on file  . Number of children: 2  . Years of education: Not on file  . Highest education level: Not on file  Occupational History  . Occupation: Retired Games developer: DISABLED  Tobacco Use  . Smoking status: Never Smoker  . Smokeless tobacco: Never Used  Vaping Use  . Vaping Use: Never used  Substance and Sexual Activity  . Alcohol use: Yes    Alcohol/week: 0.0 standard drinks    Comment: rarely  . Drug use: No  . Sexual activity: Not on file  Other Topics Concern  . Not on file  Social History Narrative   Drinks about 1 cup of coffee a day, occasional coke zero    Social Determinants of Radio broadcast assistant Strain: Not on file  Food Insecurity: Not on file  Transportation Needs: Not on file  Physical Activity: Not on file  Stress: Not on file  Social Connections: Not on file   Family History  Problem Relation Age of Onset  . Leukemia Sister   . Lupus Sister   . Depression Sister   . Rheum arthritis Sister   . Heart disease Father   . Hypertension Father   . Hyperlipidemia Father   . Neuropathy Father   . Cancer Mother   . Diabetes Mother   . Osteoporosis Mother   . Breast cancer Mother   . Migraines Daughter    Allergies  Allergen Reactions  . Atorvastatin Other (See Comments)     Extreme pain  . Codeine Other (See Comments)    Headache  . Pravastatin Other (See Comments)    Exreme pain  . Statins Other (See Comments)    Extreme pain  . Amitriptyline Other (See Comments)    Unspecified  . Fish Oil Nausea Only  . Hydrocodone Other (See Comments)    Itching Other Reaction: Other reaction   . Linseed Oil Other (See Comments)    Other Reaction: Other reaction  . Nuvigil [Armodafinil] Other (See Comments)    Unspecified  . Sulfa Antibiotics Other (See Comments)  . Erythromycin Other (See Comments)    Reaction unspecified  . Erythromycin Base Rash  . Flax Seed [Bio-Flax] Rash    Linseed  . Hydrocodone-Acetaminophen Rash  . Morphine Nausea And Vomiting and Rash  . Nsaids Nausea Only and Other (See Comments)    Other Reaction: Other reaction  . Sertraline Rash  . Sinemet [Carbidopa W-Levodopa] Rash  . Sulfamethoxazole Rash  . Tolmetin Nausea Only   Prior to Admission medications   Medication Sig Start Date End Date Taking? Authorizing Provider  acyclovir (ZOVIRAX) 400 MG tablet Take 400 mg by mouth as needed (flare ups). 12/08/19   [provider]  Adalimumab 40 MG/0.4ML PNKT Inject 40 mg into the skin every 14 (fourteen) days.  11/04/18   [provider]  ascorbic acid (VITAMIN C) 100 MG tablet Take 100 mg by mouth daily. "Gummy"    [provider]  Bempedoic Acid (NEXLETOL) 180 MG TABS Take 1 tablet by mouth daily. 07/05/20   [name], NP  benzonatate (TESSALON) 200 MG capsule Take 1 cap three times daily as needed for cough. 07/31/20   [name], NP  bisoprolol (ZEBETA) 10 MG tablet Take    1 tablet    Daily       for BP Patient taking differently: Take 10 mg by mouth daily. 04/16/20   Unk Pinto, MD  budesonide (ENTOCORT EC) 3 MG 24 hr capsule Take by mouth.    [provider]  buPROPion (WELLBUTRIN XL) 150 MG 24 hr tablet Take    1 tablet    Daily     for Mood, Focus & Concentration 07/04/20   [name], NP  calcium carbonate (OS-CAL) 1250 (500 Ca) MG chewable tablet Chew 1 tablet by mouth daily.    [provider]  Cholecalciferol 125 MCG (5000 UT) TABS Take by mouth.    [provider]  clotrimazole (MYCELEX) 10 MG troche Take 1 tablet by mouth daily. 12/26/19   [provider]  Cyanocobalamin (B-12 SL) Place 1  each under the tongue daily.     [provider]  diclofenac Sodium (VOLTAREN) 1 % GEL Apply 4 g topically 4 (four) times daily. 04/23/20   Deno Etienne, DO  DIGESTIVE ENZYMES PO Take 1 each by mouth daily.    [provider]  DULoxetine (CYMBALTA) 60 MG capsule Take 1 capsule Daily for Chronic Pain Patient taking differently: Take 60 mg by mouth daily. 05/15/19   Unk Pinto, MD  famotidine (PEPCID) 40 MG tablet Take 40 mg by mouth daily. 02/06/20   [provider]  fenofibrate micronized (LOFIBRA) 134 MG capsule Take     1 capsule     Daily       for Triglycerides (Blood Fats) 06/28/20   Unk Pinto, MD  fluconazole (DIFLUCAN) 150 MG tablet Take 150 mg by mouth See admin instructions. Sundays and Thursday    [provider]  fluticasone (FLONASE) 50 MCG/ACT nasal spray USE TWO SPRAYS IN EACH NOSTRIL DAILY AS NEEDED Patient taking differently: Place 2 sprays into both nostrils daily as needed for allergies. 03/12/20   Vladimir Crofts, PA-C  gabapentin (NEURONTIN) 300 MG capsule TAKE 1 CAPSULE BY MOUTH 3 TIMES A DAY FOR CHRONIC PAIN Patient taking differently: Take 300 mg by mouth 3 (three) times daily. 01/11/20   [name], NP  gemfibrozil (LOPID) 600 MG tablet Take     1 tablet     2 x /day     with Meals      for Cholesterol & Triglycerides 07/10/20   [name], NP  hydrocortisone 2.5 % ointment in the morning and at bedtime. 12/26/19   [provider]  levothyroxine (SYNTHROID) 100 MCG tablet TAKE 1 TABLET BY MOUTH ON TUES, WED, THURS, SAT AND SUN. TAKE 1& 1/2 TABLET ON MON AND FRI Patient  taking differently: Take 100 mcg by mouth daily. 07/15/19   [name], NP  meclizine (ANTIVERT) 25 MG tablet TAKE 1 TABLET (25 MG TOTAL) BY MOUTH 3 (THREE) TIMES DAILY AS NEEDED FOR DIZZINESS. 06/13/18   Unk Pinto, MD  metroNIDAZOLE (FLAGYL) 500 MG tablet Take 500 mg by mouth in the morning and at bedtime.     [provider]  multivitamin (VIT W/EXTRA C) CHEW chewable tablet Chew 1 tablet by mouth daily.    [provider]  nystatin (MYCOSTATIN) 100000 UNIT/ML suspension Take 15 mLs by mouth daily as needed (tooth infections).  02/06/20   [provider]  olmesartan (BENICAR) 40 MG tablet Take     1/2 to 1 tablet      at Night       for BP 06/07/20   Unk Pinto, MD  oxybutynin (DITROPAN) 5 MG tablet TAKE 1 TABLET TWICE A DAY FOR BLADDER CONTROL 07/10/20   [name], NP  rOPINIRole (REQUIP) 2 MG tablet TAKE 1 TABLET FOUR TIMES A DAY FOR RESTLESS LEGS Patient taking differently: Take 2 mg by mouth in the morning, at noon, in the evening, and at bedtime. 04/14/20   Unk Pinto, MD  tiZANidine (ZANAFLEX) 2 MG tablet TAKE 1/2 TO 1 TABLET 3 TIMES A DAY AS NEEDED FOR MUSCLE SPASMS 08/15/20   [name], NP     Positive ROS: Otherwise negative  All other systems have been reviewed and were otherwise negative with the exception of those mentioned in the HPI and as above.  Physical Exam: Constitutional: Alert, well-appearing, no acute distress.  Slight imbalance on general walking. Ears: External ears without lesions or tenderness. Ear canals  are clear bilaterally.  TMs are clear bilaterally on microscopic exam.  No middle ear abnormality noted.  On Dix-Hallpike testing she perhaps had some slight positional vertigo with a head turn to the right but this was minimal. Nasal: External nose without lesions. Septum relatively midline with mild rhinitis and clear nasal passages otherwise no significant obstruction.  No polyps.  Middle meatus regions were  clear..  Of note sinuses were clear on review of the MRI scan. Oral: Lips and gums without lesions. Tongue and palate mucosa without lesions. Posterior oropharynx clear. Neck: No palpable adenopathy or masses Respiratory: Breathing comfortably  Skin: No facial/neck lesions or rash noted.  Procedures  Assessment: Overall general dizziness. Perhaps some mild BPPV. Mild rhinitis with clear nasal passages on exam today.  Plan: For overall balance would recommend vestibular rehab and gave her a note for the physical therapist she sees concerning evaluation and treatment of overall dizziness. Gave her some information on BPPV as well as the Epley maneuver that she can perform at home. For her nasal sinus symptoms recommended use of Flonase 2 sprays each nostril at night when she is having nasal congestion and use of saline irrigation as needed.  She has both of these.   Radene Journey, MD

## 2020-08-23 ENCOUNTER — Ambulatory Visit (INDEPENDENT_AMBULATORY_CARE_PROVIDER_SITE_OTHER): Payer: Medicare PPO | Admitting: Adult Health Nurse Practitioner

## 2020-08-23 ENCOUNTER — Other Ambulatory Visit: Payer: Self-pay

## 2020-08-23 ENCOUNTER — Encounter: Payer: Self-pay | Admitting: Adult Health Nurse Practitioner

## 2020-08-23 VITALS — BP 128/88 | HR 76 | Temp 97.3°F | Wt 155.0 lb

## 2020-08-23 DIAGNOSIS — R5383 Other fatigue: Secondary | ICD-10-CM

## 2020-08-23 DIAGNOSIS — Z79899 Other long term (current) drug therapy: Secondary | ICD-10-CM | POA: Diagnosis not present

## 2020-08-23 DIAGNOSIS — R053 Chronic cough: Secondary | ICD-10-CM

## 2020-08-23 NOTE — Progress Notes (Signed)
Assessment and Plan:  Glennis was seen today for cough.  Diagnoses and all orders for this visit:  Persistent cough Discussed at length treating symptoms Cough will be last to go, viral Take guaifenesin every 12hours while having symptoms Patient struggles with multiple allergies and adverse reactions from medications Discussed if no improvement consider chest x-ray Monitor home oxygen %  Fatigue, unspecified type Discussed ways to improve fatigue, activity, dietary  Polypharmacy Discussed patient medications    Further disposition pending results of labs. Discussed med's effects and SE's.   Over 30 minutes of face to face interview, exam, counseling, chart review, and critical decision making was performed.   Future Appointments  Date Time Provider [place]  10/17/2020 11:30 AM Unk Pinto, MD [facility] None  04/08/2021 10:00 AM Unk Pinto, MD [facility] None  07/09/2021 11:15 AM [name], NP [facility] None    ------------------------------------------------------------------------------------------------------------------   HPI 70 y.o.female presents for cough that has been going on since She has personal history of April 2021, she had a bad cough that lingered.  She reports  She received her booster shot four week ago.  She has been to the ENT two days ago and all was clear.  He also reports the ENT said he has BPPV and has scheduled to go to have physical therapy for strength training for this.   She has taken home COVID test that were both negative.  She is tired of the cough, it is non-productive.  She is having sinus drainage, as well as chest tightness, burning from the congestion.  She also reports she has headaches related to the sinus.  She is using saline, fluticasone daily, guifenasin 24hr, also tried tessalon pearls.  She has not taken any antihistamine.    [employer] Prevent Diabetes (ESMMPD) she has joined this on-line  and it is through [facility].    She has had a follow up with her opthamologist.  She recently saw Dr Earlean Shawl, she is taking humira, some noted side effects could be muscle aches, cough, shortness of breath, headaches? Have follow up scheduled.    Past Medical History:  Diagnosis Date  . Arthritis   . ASCVD (arteriosclerotic cardiovascular disease)   . Closed nondisplaced subtrochanteric fracture of left femur ([place]) 09/03/2017   September 2018.  Status post surgery by Dr. Marcelino Scot  . Depression   . Fibromyalgia   . Gait disorder 10/26/2012  . GERD (gastroesophageal reflux disease)   . GI bleed   . Hyperlipidemia   . Hypertension   . Hypothyroid   . Periprosthetic supracondylar fracture of femur, left  03/23/2017  . Restless leg syndrome   . Rhinitis 06/25/2010  . Sleep apnea   . TIA (transient ischemic attack)    3        . Vitamin D deficiency      Allergies  Allergen Reactions  . Atorvastatin Other (See Comments)    Extreme pain  . Codeine Other (See Comments)    Headache  . Pravastatin Other (See Comments)    Exreme pain  . Statins Other (See Comments)    Extreme pain  . Amitriptyline Other (See Comments)    Unspecified  . Fish Oil Nausea Only  . Hydrocodone Other (See Comments)    Itching Other Reaction: Other reaction   . Linseed Oil Other (See Comments)    Other Reaction: Other reaction  . Nuvigil [Armodafinil] Other (See Comments)    Unspecified  . Sulfa Antibiotics Other (See Comments)  .  Erythromycin Other (See Comments)    Reaction unspecified  . Erythromycin Base Rash  . Flax Seed [Bio-Flax] Rash    Linseed  . Hydrocodone-Acetaminophen Rash  . Morphine Nausea And Vomiting and Rash  . Nsaids Nausea Only and Other (See Comments)    Other Reaction: Other reaction  . Sertraline Rash  . Sinemet [Carbidopa W-Levodopa] Rash  . Sulfamethoxazole Rash  . Tolmetin Nausea Only    Current Outpatient Medications on File Prior to Visit  Medication Sig  .  acyclovir (ZOVIRAX) 400 MG tablet Take 400 mg by mouth as needed (flare ups).  . Adalimumab 40 MG/0.4ML PNKT Inject 40 mg into the skin every 14 (fourteen) days.   [name] ascorbic acid (VITAMIN C) 100 MG tablet Take 100 mg by mouth daily. "Gummy"  . Bempedoic Acid (NEXLETOL) 180 MG TABS Take 1 tablet by mouth daily.  . benzonatate (TESSALON) 200 MG capsule Take 1 cap three times daily as needed for cough.  . bisoprolol (ZEBETA) 10 MG tablet Take    1 tablet    Daily       for BP (Patient taking differently: Take 10 mg by mouth daily.)  . budesonide (ENTOCORT EC) 3 MG 24 hr capsule Take by mouth.  [name] buPROPion (WELLBUTRIN XL) 150 MG 24 hr tablet Take    1 tablet    Daily     for Mood, Focus & Concentration  . calcium carbonate (OS-CAL) 1250 (500 Ca) MG chewable tablet Chew 1 tablet by mouth daily.  . Cholecalciferol 125 MCG (5000 UT) TABS Take by mouth.  . clotrimazole (MYCELEX) 10 MG troche Take 1 tablet by mouth daily.  . Cyanocobalamin (B-12 SL) Place 1 each under the tongue daily.   . diclofenac Sodium (VOLTAREN) 1 % GEL Apply 4 g topically 4 (four) times daily.  [name] DIGESTIVE ENZYMES PO Take 1 each by mouth daily.  . DULoxetine (CYMBALTA) 60 MG capsule Take 1 capsule Daily for Chronic Pain (Patient taking differently: Take 60 mg by mouth daily.)  . famotidine (PEPCID) 40 MG tablet Take 40 mg by mouth daily.  . fenofibrate micronized (LOFIBRA) 134 MG capsule Take     1 capsule     Daily       for Triglycerides (Blood Fats)  . fluconazole (DIFLUCAN) 150 MG tablet Take 150 mg by mouth See admin instructions. Sundays and Thursday  . fluticasone (FLONASE) 50 MCG/ACT nasal spray USE TWO SPRAYS IN EACH NOSTRIL DAILY AS NEEDED (Patient taking differently: Place 2 sprays into both nostrils daily as needed for allergies.)  . gabapentin (NEURONTIN) 300 MG capsule TAKE 1 CAPSULE BY MOUTH 3 TIMES A DAY FOR CHRONIC PAIN (Patient taking differently: Take 300 mg by mouth 3 (three) times daily.)  . gemfibrozil  (LOPID) 600 MG tablet Take     1 tablet     2 x /day     with Meals      for Cholesterol & Triglycerides  . hydrocortisone 2.5 % ointment in the morning and at bedtime.  [name] levothyroxine (SYNTHROID) 100 MCG tablet TAKE 1 TABLET BY MOUTH ON TUES, WED, THURS, SAT AND SUN. TAKE 1& 1/2 TABLET ON MON AND FRI (Patient taking differently: Take 100 mcg by mouth daily.)  . meclizine (ANTIVERT) 25 MG tablet TAKE 1 TABLET (25 MG TOTAL) BY MOUTH 3 (THREE) TIMES DAILY AS NEEDED FOR DIZZINESS.  [name] metroNIDAZOLE (FLAGYL) 500 MG tablet Take 500 mg by mouth in the morning and at bedtime.   . multivitamin (VIT  W/EXTRA C) CHEW chewable tablet Chew 1 tablet by mouth daily.  [name] nystatin (MYCOSTATIN) 100000 UNIT/ML suspension Take 15 mLs by mouth daily as needed (tooth infections).   [name] oxybutynin (DITROPAN) 5 MG tablet TAKE 1 TABLET TWICE A DAY FOR BLADDER CONTROL  . rOPINIRole (REQUIP) 2 MG tablet TAKE 1 TABLET FOUR TIMES A DAY FOR RESTLESS LEGS (Patient taking differently: Take 2 mg by mouth in the morning, at noon, in the evening, and at bedtime.)  . tiZANidine (ZANAFLEX) 2 MG tablet TAKE 1/2 TO 1 TABLET 3 TIMES A DAY AS NEEDED FOR MUSCLE SPASMS   No current facility-administered medications on file prior to visit.    ROS: all negative except above.   Physical Exam:  BP 128/88   Pulse 76   Temp (!) 97.3 F (36.3 C)   Wt 155 lb (70.3 kg)   SpO2 93%   BMI 28.35 kg/m   General Appearance: Well nourished, in no apparent distress. Eyes: PERRLA, EOMs, conjunctiva no swelling or erythema Sinuses: No Frontal/maxillary tenderness ENT/Mouth: Ext aud canals clear, TMs without erythema, bulging. No erythema, swelling, or exudate on post pharynx.  Tonsils not swollen or erythematous. Hearing normal.  Neck: Supple, thyroid normal.  Respiratory: Respiratory effort normal, BS equal bilaterally without rales, rhonchi, wheezing or stridor.  Cardio: RRR with no MRGs. Brisk peripheral pulses without edema.  Abdomen: Soft, +  BS.  Non tender, no guarding, rebound, hernias, masses. Lymphatics: Non tender without lymphadenopathy.  Musculoskeletal: Full ROM, 5/5 strength, normal gait.  Skin: Warm, dry without rashes, lesions, ecchymosis.  Neuro: Cranial nerves intact. Normal muscle tone, no cerebellar symptoms. Sensation intact.  Psych: Awake and oriented X 3, normal affect, Insight and Judgment appropriate.     Garnet Sierras, NP [facility] 08/23/2020

## 2020-08-23 NOTE — Patient Instructions (Addendum)
   Start taking an antihistamine, Cetirazine (Zyrtec), Xyzal (Levocetirazine) OR fexofenadine (Allegra)  This will help dry up sinuses, throat irritation and nasal passages.    Also consider Saline Gel to apply to the inside of your nasal passages.   Saline Nasal Spray:   You can get this at any pharmacy Use as directed on package This will help to sooth inside of your nose from irritation  [facility] Sinus Rinse / Neti Pot - IF tolerated Use warm bottled or distilled water DO NOT use tap water! Use twice a day as needed This will help to sooth irritated sinuses and clear nasal congestion If using nasal sprays, do so after completing this.  Flonase: One spray in each nostril daily  This will help to open your nasal passages Use this AFTER you do any type of nasal rinse    Continue taking the guaifenesin around the clock while you have symptoms.   Contact office if no improvement and we can send you for a Chest X-ray.

## 2020-08-29 ENCOUNTER — Other Ambulatory Visit: Payer: Self-pay

## 2020-08-29 ENCOUNTER — Encounter (HOSPITAL_COMMUNITY): Payer: Self-pay | Admitting: Emergency Medicine

## 2020-08-29 ENCOUNTER — Emergency Department (HOSPITAL_COMMUNITY)
Admission: EM | Admit: 2020-08-29 | Discharge: 2020-08-29 | Disposition: A | Discharge status: Home / Self Care | Payer: Medicare PPO | Attending: Emergency Medicine | Admitting: Emergency Medicine

## 2020-08-29 ENCOUNTER — Emergency Department (HOSPITAL_COMMUNITY): Payer: Medicare PPO

## 2020-08-29 DIAGNOSIS — Z96651 Presence of right artificial knee joint: Secondary | ICD-10-CM | POA: Insufficient documentation

## 2020-08-29 DIAGNOSIS — Z79899 Other long term (current) drug therapy: Secondary | ICD-10-CM | POA: Insufficient documentation

## 2020-08-29 DIAGNOSIS — E039 Hypothyroidism, unspecified: Secondary | ICD-10-CM | POA: Diagnosis not present

## 2020-08-29 DIAGNOSIS — Z20822 Contact with and (suspected) exposure to covid-19: Secondary | ICD-10-CM | POA: Insufficient documentation

## 2020-08-29 DIAGNOSIS — R059 Cough, unspecified: Secondary | ICD-10-CM

## 2020-08-29 DIAGNOSIS — N182 Chronic kidney disease, stage 2 (mild): Secondary | ICD-10-CM | POA: Diagnosis not present

## 2020-08-29 DIAGNOSIS — E1122 Type 2 diabetes mellitus with diabetic chronic kidney disease: Secondary | ICD-10-CM | POA: Insufficient documentation

## 2020-08-29 DIAGNOSIS — I129 Hypertensive chronic kidney disease with stage 1 through stage 4 chronic kidney disease, or unspecified chronic kidney disease: Secondary | ICD-10-CM | POA: Insufficient documentation

## 2020-08-29 DIAGNOSIS — Z8673 Personal history of transient ischemic attack (TIA), and cerebral infarction without residual deficits: Secondary | ICD-10-CM | POA: Insufficient documentation

## 2020-08-29 DIAGNOSIS — Z8616 Personal history of COVID-19: Secondary | ICD-10-CM | POA: Insufficient documentation

## 2020-08-29 NOTE — ED Provider Notes (Signed)
[facility] DEPT Provider Note   CSN: [id number] Arrival date & time: 08/29/20  1457     History Chief Complaint  Patient presents with  . Cough    Crystal [name] is a 70 y.o. female.  Patient presents ER chief complaint of cough.  Cough is been ongoing for 3 to 4 weeks per patient.  She denies any fevers denies chills denies vomiting or diarrhea denies shortness of breath.  She is tried multiple home remedies for her cough without improvement.  And presents to the ER for further evaluation.  She denies any chest pain.        Past Medical History:  Diagnosis Date  . Arthritis   . ASCVD (arteriosclerotic cardiovascular disease)   . Closed nondisplaced subtrochanteric fracture of left femur ([place]) 09/03/2017   September 2018.  Status post surgery by Dr. Marcelino Scot  . Depression   . Fibromyalgia   . Gait disorder 10/26/2012  . GERD (gastroesophageal reflux disease)   . GI bleed   . Hyperlipidemia   . Hypertension   . Hypothyroid   . Periprosthetic supracondylar fracture of femur, left  03/23/2017  . Restless leg syndrome   . Rhinitis 06/25/2010  . Sleep apnea   . TIA (transient ischemic attack)    3        . Vitamin D deficiency     Patient Active Problem List   Diagnosis Date Noted  . Aortic atherosclerosis ([place]) 07/05/2020  . CKD stage 2 due to type 2 diabetes mellitus ([place]) 11/16/2019  . COVID-19 virus infection 10/19/2019  . Presence of artificial knee joint, right 09/20/2019  . History of total knee arthroplasty, right 05/17/2019  . Pain due to total right knee replacement ([place]) 05/17/2019  . History of recurrent TIAs 11/15/2018  . Rupture of popliteal cyst of right knee region (complex cyst) 11/03/2018  . Diet-controlled type 2 diabetes mellitus ([place]) 07/04/2018  . History of adenomatous polyp of colon 10/18/2017  . Bronchiectasis without complication ([place]) 91/69/4503  . Diverticulosis 10/12/2017  . Internal hemorrhoids  10/12/2017  . Polypharmacy 04/09/2017  . Arthritis of carpometacarpal (CMC) joints of both thumbs 02/12/2017  . Cervical disc disease 01/23/2017  . Constipation 01/23/2017  . Crohn's ileitis ([place]) 01/23/2017  . S/P revision of total knee, left 12/18/2016  . B12 deficiency anemia 06/16/2016  . Sleep talking 03/26/2016  . IBS (irritable colon syndrome) 08/09/2015  . Spinal stenosis in cervical region 06/14/2015  . Other specified arthropathy involving shoulder region 11/23/2014  . Obesity (BMI 30.0-34.9) 10/31/2014  . Medication management 10/25/2013  . Rotator cuff arthropathy 09/15/2013  . Hyperlipidemia associated with type 2 diabetes mellitus ([place])   . Hypothyroid   . Depression, major, recurrent, in partial remission ([place])   . Vitamin D deficiency   . ASCVD   . Gait disorder 10/26/2012  . Knee pain 09/07/2012  . Rotator cuff tear 08/23/2012  . Bilateral bunions 11/19/2011  . Rhinitis 06/25/2010  . Obstructive sleep apnea 06/21/2010  . RESTLESS LEG SYNDROME 06/21/2010  . Essential hypertension 06/21/2010  . GERD 06/21/2010  . Crohn's Enterocolitis / Regional enteritis 06/21/2010  . Fibromyalgia 06/21/2010    Past Surgical History:  Procedure Laterality Date  . ABDOMINAL HYSTERECTOMY  1992  . ANTERIOR CERVICAL DECOMPRESSION/DISCECTOMY FUSION 4 LEVELS N/A 06/14/2015   Procedure: Cervical three-four Cervical four-five Cervical five- six Cervical six- seven  Anterior cervical decompression/diskectomy/fusion;  Surgeon: Erline Levine, MD;  Location: [facility];  Service: Neurosurgery;  Laterality: N/A;  C3-4 C4-5 C5-6 C6-7 Anterior cervical decompression/diskectomy/fusion  . APPENDECTOMY  1960  . [place]  . JOINT REPLACEMENT     2   LEFT  1 RIGHT   . KNEE SURGERY    . ORIF FEMUR FRACTURE Left 03/24/2017   Procedure: OPEN REDUCTION INTERNAL FIXATION (ORIF) FEMUR FRACTURE;  Surgeon: Altamese Amherst, MD;  Location: [facility];  Service: Orthopedics;  Laterality:  Left;  . ROTATOR CUFF REPAIR Left 2005   2 LEFT  1  RIGHT  . SHOULDER ADHESION RELEASE    . SPINE SURGERY    . TOE SURGERY     LEFT       . TOE SURGERY     RIGHT  BIG TOE  . TONSILLECTOMY  1960     OB History   No obstetric history on file.     Family History  Problem Relation Age of Onset  . Leukemia Sister   . Lupus Sister   . Depression Sister   . Rheum arthritis Sister   . Heart disease Father   . Hypertension Father   . Hyperlipidemia Father   . Neuropathy Father   . Cancer Mother   . Diabetes Mother   . Osteoporosis Mother   . Breast cancer Mother   . Migraines Daughter     Social History   Tobacco Use  . Smoking status: Never Smoker  . Smokeless tobacco: Never Used  Vaping Use  . Vaping Use: Never used  Substance Use Topics  . Alcohol use: Yes    Alcohol/week: 0.0 standard drinks    Comment: rarely  . Drug use: No    Home Medications Prior to Admission medications   Medication Sig Start Date End Date Taking? Authorizing Provider  acyclovir (ZOVIRAX) 400 MG tablet Take 400 mg by mouth as needed (flare ups). 12/08/19   [provider]  Adalimumab 40 MG/0.4ML PNKT Inject 40 mg into the skin every 14 (fourteen) days.  11/04/18   [provider]  ascorbic acid (VITAMIN C) 100 MG tablet Take 100 mg by mouth daily. "Gummy"    [provider]  Bempedoic Acid (NEXLETOL) 180 MG TABS Take 1 tablet by mouth daily. 07/05/20   [name], NP  benzonatate (TESSALON) 200 MG capsule Take 1 cap three times daily as needed for cough. 07/31/20   [name], NP  bisoprolol (ZEBETA) 10 MG tablet Take    1 tablet    Daily       for BP Patient taking differently: Take 10 mg by mouth daily. 04/16/20   Unk Pinto, MD  budesonide (ENTOCORT EC) 3 MG 24 hr capsule Take by mouth.    [provider]  buPROPion (WELLBUTRIN XL) 150 MG 24 hr tablet Take    1 tablet    Daily     for Mood, Focus & Concentration 07/04/20   [name], NP   calcium carbonate (OS-CAL) 1250 (500 Ca) MG chewable tablet Chew 1 tablet by mouth daily.    [provider]  Cholecalciferol 125 MCG (5000 UT) TABS Take by mouth.    [provider]  clotrimazole (MYCELEX) 10 MG troche Take 1 tablet by mouth daily. 12/26/19   [provider]  Cyanocobalamin (B-12 SL) Place 1 each under the tongue daily.     [provider]  diclofenac Sodium (VOLTAREN) 1 % GEL Apply 4 g topically 4 (four) times daily. 04/23/20   Deno Etienne, DO  DIGESTIVE ENZYMES PO Take  1 each by mouth daily.    [provider]  DULoxetine (CYMBALTA) 60 MG capsule Take 1 capsule Daily for Chronic Pain Patient taking differently: Take 60 mg by mouth daily. 05/15/19   Unk Pinto, MD  famotidine (PEPCID) 40 MG tablet Take 40 mg by mouth daily. 02/06/20   [provider]  fenofibrate micronized (LOFIBRA) 134 MG capsule Take     1 capsule     Daily       for Triglycerides (Blood Fats) 06/28/20   Unk Pinto, MD  fluconazole (DIFLUCAN) 150 MG tablet Take 150 mg by mouth See admin instructions. Sundays and Thursday    [provider]  fluticasone (FLONASE) 50 MCG/ACT nasal spray USE TWO SPRAYS IN EACH NOSTRIL DAILY AS NEEDED Patient taking differently: Place 2 sprays into both nostrils daily as needed for allergies. 03/12/20   Vladimir Crofts, PA-C  gabapentin (NEURONTIN) 300 MG capsule TAKE 1 CAPSULE BY MOUTH 3 TIMES A DAY FOR CHRONIC PAIN Patient taking differently: Take 300 mg by mouth 3 (three) times daily. 01/11/20   [name], NP  gemfibrozil (LOPID) 600 MG tablet Take     1 tablet     2 x /day     with Meals      for Cholesterol & Triglycerides 07/10/20   [name], NP  hydrocortisone 2.5 % ointment in the morning and at bedtime. 12/26/19   [provider]  levothyroxine (SYNTHROID) 100 MCG tablet TAKE 1 TABLET BY MOUTH ON TUES, WED, THURS, SAT AND SUN. TAKE 1& 1/2 TABLET ON MON AND FRI Patient taking  differently: Take 100 mcg by mouth daily. 07/15/19   [name], NP  meclizine (ANTIVERT) 25 MG tablet TAKE 1 TABLET (25 MG TOTAL) BY MOUTH 3 (THREE) TIMES DAILY AS NEEDED FOR DIZZINESS. 06/13/18   Unk Pinto, MD  metroNIDAZOLE (FLAGYL) 500 MG tablet Take 500 mg by mouth in the morning and at bedtime.     [provider]  multivitamin (VIT W/EXTRA C) CHEW chewable tablet Chew 1 tablet by mouth daily.    [provider]  nystatin (MYCOSTATIN) 100000 UNIT/ML suspension Take 15 mLs by mouth daily as needed (tooth infections).  02/06/20   [provider]  olmesartan (BENICAR) 40 MG tablet Take     1/2 to 1 tablet      at Night       for BP 06/07/20   Unk Pinto, MD  oxybutynin (DITROPAN) 5 MG tablet TAKE 1 TABLET TWICE A DAY FOR BLADDER CONTROL 07/10/20   [name], NP  rOPINIRole (REQUIP) 2 MG tablet TAKE 1 TABLET FOUR TIMES A DAY FOR RESTLESS LEGS Patient taking differently: Take 2 mg by mouth in the morning, at noon, in the evening, and at bedtime. 04/14/20   Unk Pinto, MD  tiZANidine (ZANAFLEX) 2 MG tablet TAKE 1/2 TO 1 TABLET 3 TIMES A DAY AS NEEDED FOR MUSCLE SPASMS 08/15/20   [name], NP    Allergies    Atorvastatin, Codeine, Pravastatin, Statins, Amitriptyline, Fish oil, Hydrocodone, Linseed oil, Nuvigil [armodafinil], Sulfa antibiotics, Erythromycin, Erythromycin base, Flax seed [bio-flax], Hydrocodone-acetaminophen, Morphine, Nsaids, Sertraline, Sinemet [carbidopa w-levodopa], Sulfamethoxazole, and Tolmetin  Review of Systems   Review of Systems  Constitutional: Negative for fever.  HENT: Negative for ear pain.   Eyes: Negative for pain.  Respiratory: Negative for cough.   Cardiovascular: Negative for chest pain.  Gastrointestinal: Negative for abdominal pain.  Genitourinary: Negative for flank pain.  Musculoskeletal: Negative for back pain.  Skin: Negative for rash.  Neurological: Negative for headaches.    Physical  Exam Updated Vital Signs BP 132/86 (BP Location: Right Arm)   Pulse 93   Temp 99.1 F (37.3 C) (Oral)   Resp 16   SpO2 99%   Physical Exam Constitutional:      General: She is not in acute distress.    Appearance: Normal appearance.  HENT:     Head: Normocephalic.     Nose: Nose normal.  Eyes:     Extraocular Movements: Extraocular movements intact.  Cardiovascular:     Rate and Rhythm: Normal rate.  Pulmonary:     Effort: Pulmonary effort is normal.     Breath sounds: Normal breath sounds. No stridor. No wheezing, rhonchi or rales.  Chest:     Chest wall: No tenderness.  Musculoskeletal:        General: Normal range of motion.     Cervical back: Normal range of motion.  Neurological:     General: No focal deficit present.     Mental Status: She is alert. Mental status is at baseline.     ED Results / Procedures / Treatments   Labs (all labs ordered are listed, but only abnormal results are displayed) Labs Reviewed  SARS CORONAVIRUS 2 (TAT 6-24 HRS)    EKG None  Radiology DG Chest 2 View  Result Date: 08/29/2020 CLINICAL DATA:  Cough for several weeks EXAM: CHEST - 2 VIEW COMPARISON:  04/23/2020 FINDINGS: Cardiac shadow is within normal limits. The lungs are clear bilaterally. Postsurgical changes in the right shoulder and cervical spine are noted. No acute abnormality is seen. IMPRESSION: No active cardiopulmonary disease. Electronically Signed   By: Inez Catalina M.D.   On: 08/29/2020 16:06    Procedures Procedures   Medications Ordered in ED Medications - No data to display  ED Course  I have reviewed the triage vital signs and the nursing notes.  Pertinent labs & imaging results that were available during my care of the patient were reviewed by me and considered in my medical decision making (see chart for details).    MDM Rules/Calculators/A&P                          Vital signs are within normal limits temperature mildly elevated but  afebrile.  Chest x-ray is unremarkable no active disease noted per radiologist.  Patient states she took multiple Covid test at home which were unremarkable.  Given the PCR Covid test here in the ER.  Recommending isolation until results return.  Advised continued cough drops at home, advised to follow-up with her doctor within the week, advised immediate return for difficulty breathing pain fevers or any additional concerns.   Final Clinical Impression(s) / ED Diagnoses Final diagnoses:  Cough    Rx / DC Orders ED Discharge Orders    None       Luna Fuse, MD 08/29/20 [phone]

## 2020-08-29 NOTE — Discharge Instructions (Addendum)
Call your primary care doctor or specialist as discussed in the next 2-3 days.   Return immediately back to the ER if:  Your symptoms worsen within the next 12-24 hours. You develop new symptoms such as new fevers, persistent vomiting, new pain, shortness of breath, or new weakness or numbness, or if you have any other concerns.  

## 2020-08-29 NOTE — ED Triage Notes (Signed)
70 yo female present pov c/o dry cough x 3 weeks. Pt states she was given tessalon pearls, an antihistamine, mucus relief, as well as flonase but she is having no relief. Pt states the cough is non productive. Pt states she tested herself twice for covid and it was negative. Pt is also vaccinated and has had her covid booster.Pt states she has associated light headedness. Pt denies fever or any other symptoms a this time.

## 2020-08-29 NOTE — ED Notes (Signed)
An After Visit Summary was printed and given to the patient. Discharge instructions given and no further questions at this time.  

## 2020-08-30 ENCOUNTER — Other Ambulatory Visit: Payer: Self-pay | Admitting: Internal Medicine

## 2020-08-30 DIAGNOSIS — I10 Essential (primary) hypertension: Secondary | ICD-10-CM

## 2020-08-30 LAB — SARS CORONAVIRUS 2 (TAT 6-24 HRS): SARS Coronavirus 2: NEGATIVE

## 2020-08-31 DIAGNOSIS — M5412 Radiculopathy, cervical region: Secondary | ICD-10-CM | POA: Diagnosis not present

## 2020-08-31 DIAGNOSIS — M961 Postlaminectomy syndrome, not elsewhere classified: Secondary | ICD-10-CM | POA: Diagnosis not present

## 2020-08-31 DIAGNOSIS — M797 Fibromyalgia: Secondary | ICD-10-CM | POA: Diagnosis not present

## 2020-08-31 DIAGNOSIS — M47812 Spondylosis without myelopathy or radiculopathy, cervical region: Secondary | ICD-10-CM | POA: Diagnosis not present

## 2020-09-06 ENCOUNTER — Ambulatory Visit: Payer: Medicare PPO

## 2020-09-06 ENCOUNTER — Ambulatory Visit: Payer: Medicare PPO | Admitting: Neurology

## 2020-09-11 ENCOUNTER — Other Ambulatory Visit: Payer: Self-pay | Admitting: Adult Health

## 2020-09-12 ENCOUNTER — Other Ambulatory Visit: Payer: Self-pay

## 2020-09-12 ENCOUNTER — Emergency Department (HOSPITAL_COMMUNITY): Payer: Medicare PPO

## 2020-09-12 ENCOUNTER — Encounter (HOSPITAL_COMMUNITY): Payer: Self-pay

## 2020-09-12 ENCOUNTER — Emergency Department (HOSPITAL_COMMUNITY)
Admission: EM | Admit: 2020-09-12 | Discharge: 2020-09-12 | Disposition: A | Discharge status: Home / Self Care | Payer: Medicare PPO | Attending: Emergency Medicine | Admitting: Emergency Medicine

## 2020-09-12 DIAGNOSIS — I129 Hypertensive chronic kidney disease with stage 1 through stage 4 chronic kidney disease, or unspecified chronic kidney disease: Secondary | ICD-10-CM | POA: Diagnosis not present

## 2020-09-12 DIAGNOSIS — Z8673 Personal history of transient ischemic attack (TIA), and cerebral infarction without residual deficits: Secondary | ICD-10-CM | POA: Diagnosis not present

## 2020-09-12 DIAGNOSIS — Z79899 Other long term (current) drug therapy: Secondary | ICD-10-CM | POA: Diagnosis not present

## 2020-09-12 DIAGNOSIS — Z8616 Personal history of COVID-19: Secondary | ICD-10-CM | POA: Insufficient documentation

## 2020-09-12 DIAGNOSIS — Z20822 Contact with and (suspected) exposure to covid-19: Secondary | ICD-10-CM | POA: Insufficient documentation

## 2020-09-12 DIAGNOSIS — R7989 Other specified abnormal findings of blood chemistry: Secondary | ICD-10-CM | POA: Diagnosis not present

## 2020-09-12 DIAGNOSIS — E039 Hypothyroidism, unspecified: Secondary | ICD-10-CM | POA: Diagnosis not present

## 2020-09-12 DIAGNOSIS — R059 Cough, unspecified: Secondary | ICD-10-CM | POA: Insufficient documentation

## 2020-09-12 DIAGNOSIS — E1122 Type 2 diabetes mellitus with diabetic chronic kidney disease: Secondary | ICD-10-CM | POA: Insufficient documentation

## 2020-09-12 DIAGNOSIS — N182 Chronic kidney disease, stage 2 (mild): Secondary | ICD-10-CM | POA: Diagnosis not present

## 2020-09-12 DIAGNOSIS — R000 Tachycardia, unspecified: Secondary | ICD-10-CM | POA: Insufficient documentation

## 2020-09-12 DIAGNOSIS — R0981 Nasal congestion: Secondary | ICD-10-CM | POA: Diagnosis not present

## 2020-09-12 DIAGNOSIS — I10 Essential (primary) hypertension: Secondary | ICD-10-CM

## 2020-09-12 DIAGNOSIS — Z96653 Presence of artificial knee joint, bilateral: Secondary | ICD-10-CM | POA: Insufficient documentation

## 2020-09-12 DIAGNOSIS — M47812 Spondylosis without myelopathy or radiculopathy, cervical region: Secondary | ICD-10-CM | POA: Diagnosis not present

## 2020-09-12 DIAGNOSIS — M797 Fibromyalgia: Secondary | ICD-10-CM | POA: Diagnosis not present

## 2020-09-12 DIAGNOSIS — M5416 Radiculopathy, lumbar region: Secondary | ICD-10-CM | POA: Diagnosis not present

## 2020-09-12 DIAGNOSIS — M5412 Radiculopathy, cervical region: Secondary | ICD-10-CM | POA: Diagnosis not present

## 2020-09-12 LAB — CBC WITH DIFFERENTIAL/PLATELET
Abs Immature Granulocytes: 0.06 10*3/uL (ref 0.00–0.07)
Basophils Absolute: 0.1 10*3/uL (ref 0.0–0.1)
Basophils Relative: 2 %
Eosinophils Absolute: 0.1 10*3/uL (ref 0.0–0.5)
Eosinophils Relative: 2 %
HCT: 44.5 % (ref 36.0–46.0)
Hemoglobin: 14.5 g/dL (ref 12.0–15.0)
Immature Granulocytes: 1 %
Lymphocytes Relative: 26 %
Lymphs Abs: 1.4 10*3/uL (ref 0.7–4.0)
MCH: 31.8 pg (ref 26.0–34.0)
MCHC: 32.6 g/dL (ref 30.0–36.0)
MCV: 97.6 fL (ref 80.0–100.0)
Monocytes Absolute: 0.7 10*3/uL (ref 0.1–1.0)
Monocytes Relative: 12 %
Neutro Abs: 3.2 10*3/uL (ref 1.7–7.7)
Neutrophils Relative %: 57 %
Platelets: 271 10*3/uL (ref 150–400)
RBC: 4.56 MIL/uL (ref 3.87–5.11)
RDW: 12.4 % (ref 11.5–15.5)
WBC: 5.6 10*3/uL (ref 4.0–10.5)
nRBC: 0 % (ref 0.0–0.2)

## 2020-09-12 LAB — COMPREHENSIVE METABOLIC PANEL
ALT: 12 U/L (ref 0–44)
AST: 23 U/L (ref 15–41)
Albumin: 3.9 g/dL (ref 3.5–5.0)
Alkaline Phosphatase: 43 U/L (ref 38–126)
Anion gap: 15 (ref 5–15)
BUN: 22 mg/dL (ref 8–23)
CO2: 23 mmol/L (ref 22–32)
Calcium: 9.8 mg/dL (ref 8.9–10.3)
Chloride: 104 mmol/L (ref 98–111)
Creatinine, Ser: 0.97 mg/dL (ref 0.44–1.00)
GFR, Estimated: >60 mL/min (ref >60)
Glucose, Bld: 101 mg/dL — ABNORMAL HIGH (ref 70–99)
Potassium: 3.6 mmol/L (ref 3.5–5.1)
Sodium: 142 mmol/L (ref 135–145)
Total Bilirubin: 0.5 mg/dL (ref 0.3–1.2)
Total Protein: 6.7 g/dL (ref 6.5–8.1)

## 2020-09-12 LAB — D-DIMER, QUANTITATIVE: D-Dimer, Quant: 1.75 ug/mL-FEU — ABNORMAL HIGH (ref 0.00–0.50)

## 2020-09-12 LAB — TROPONIN I (HIGH SENSITIVITY): Troponin I (High Sensitivity): 3 ng/L (ref <18)

## 2020-09-12 LAB — LIPASE, BLOOD: Lipase: 30 U/L (ref 11–51)

## 2020-09-12 LAB — RESP PANEL BY RT-PCR (FLU A&B, COVID) ARPGX2
Influenza A by PCR: NEGATIVE
Influenza B by PCR: NEGATIVE
SARS Coronavirus 2 by RT PCR: NEGATIVE

## 2020-09-12 MED ORDER — IOHEXOL 350 MG/ML SOLN
75.0000 mL | Freq: Once | INTRAVENOUS | Status: AC | PRN
  Administered 2020-09-12: 75 mL via INTRAVENOUS

## 2020-09-12 NOTE — Discharge Instructions (Signed)
Crystal [name], it was a pleasure taking care of you. Your testing here was reassuring. You had a negative CT scan for blood clot in the lungs, negative tests for COVID and flu, normal blood count and kidney function, normal heart enzymes.   For your cough, I recommend using a Neti Pot daily to flush your sinuses, and use the Fluticasone nasal spray twice daily.  For your blood pressure, it is elevated not but at a concerning level at this time. This may improve as your cough gets better. Follow up for this with your PCP.

## 2020-09-12 NOTE — ED Provider Notes (Signed)
[facility] DEPT Provider Note   CSN: [id number] Arrival date & time: 09/12/20  1351     History Chief Complaint  Patient presents with  . Hypertension  . Cough    Ardis Lawley is a 70 y.o. female with history of HTN, ASCVD, HLD, DM, hypothyroidism, sleep apnea, TIA presenting with chief complaint of elevated blood pressure. BP of 156/96 here, and she brings a BP log from home with measurements of 902I-097D systolic and 53-299M diastolic. Denies palpitations. Endorses mild chest pain with cough and mild leg swelling in the mornings that resolves when she takes her medications. Also has a mild headache located over frontal sinuses. No other neurologic symptoms. She takes bisoprolol 62m daily and olmesartan 412mat home for her blood pressure. The olmesartan is prescribed 1/2 to 1 tablet nightly for BP.   Has had a cough with onset 6 weeks ago productive of small amount of clear-yellow sputum. She has been evaluated for this by her PCP on 2/24 and at the ED on 3/2. Had normal CXR and negative COVID test at this last ED visit. Had COVID in April 2021, and is vaccinated x2 with booster. She has used saline nasal spray, fluticasone, guaifenasin, tessalon pearls, and antihistamines at home with some improvement in symptoms. She has associated chest pain with cough and deep breath, congestion, headache located over frontal sinuses, mild SOB at times. Denies fever, chills, diarrhea.    Past Medical History:  Diagnosis Date  . Arthritis   . ASCVD (arteriosclerotic cardiovascular disease)   . Closed nondisplaced subtrochanteric fracture of left femur (HCDering Harbor3/12/2017   September 2018.  Status post surgery by Dr. HaMarcelino Scot. Depression   . Fibromyalgia   . Gait disorder 10/26/2012  . GERD (gastroesophageal reflux disease)   . GI bleed   . Hyperlipidemia   . Hypertension   . Hypothyroid   . Periprosthetic supracondylar fracture of femur, left  03/23/2017   . Restless leg syndrome   . Rhinitis 06/25/2010  . Sleep apnea   . TIA (transient ischemic attack)    3        . Vitamin D deficiency     Patient Active Problem List   Diagnosis Date Noted  . Aortic atherosclerosis (HCLynch[place]01/11/2020  . CKD stage 2 due to type 2 diabetes mellitus (HCSanders05/19/2021  . COVID-19 virus infection 10/19/2019  . Presence of artificial knee joint, right 09/20/2019  . History of total knee arthroplasty, right 05/17/2019  . Pain due to total right knee replacement (HCSt. Matthews11/17/2020  . History of recurrent TIAs 11/15/2018  . Rupture of popliteal cyst of right knee region (complex cyst) 11/03/2018  . Diet-controlled type 2 diabetes mellitus (HCBenson01/10/2018  . History of adenomatous polyp of colon 10/18/2017  . Bronchiectasis without complication (HCMontreat0442/[id number]/3419. Diverticulosis 10/12/2017  . Internal hemorrhoids 10/12/2017  . Polypharmacy 04/09/2017  . Arthritis of carpometacarpal (CMC) joints of both thumbs 02/12/2017  . Cervical disc disease 01/23/2017  . Constipation 01/23/2017  . Crohn's ileitis (HCNew Hope07/27/2018  . S/P revision of total knee, left 12/18/2016  . B12 deficiency anemia 06/16/2016  . Sleep talking 03/26/2016  . IBS (irritable colon syndrome) 08/09/2015  . Spinal stenosis in cervical region 06/14/2015  . Other specified arthropathy involving shoulder region 11/23/2014  . Obesity (BMI 30.0-34.9) 10/31/2014  . Medication management 10/25/2013  . Rotator cuff arthropathy 09/15/2013  . Hyperlipidemia associated with type 2 diabetes mellitus (HCGreen Cove Springs  . Hypothyroid   .  Depression, major, recurrent, in partial remission ([place])   . Vitamin D deficiency   . ASCVD   . Gait disorder 10/26/2012  . Knee pain 09/07/2012  . Rotator cuff tear 08/23/2012  . Bilateral bunions 11/19/2011  . Rhinitis 06/25/2010  . Obstructive sleep apnea 06/21/2010  . RESTLESS LEG SYNDROME 06/21/2010  . Essential hypertension 06/21/2010  . GERD 06/21/2010  . Crohn's  Enterocolitis / Regional enteritis 06/21/2010  . Fibromyalgia 06/21/2010    Past Surgical History:  Procedure Laterality Date  . ABDOMINAL HYSTERECTOMY  1992  . ANTERIOR CERVICAL DECOMPRESSION/DISCECTOMY FUSION 4 LEVELS N/A 06/14/2015   Procedure: Cervical three-four Cervical four-five Cervical five- six Cervical six- seven  Anterior cervical decompression/diskectomy/fusion;  Surgeon: Erline Levine, MD;  Location: [facility];  Service: Neurosurgery;  Laterality: N/A;  C3-4 C4-5 C5-6 C6-7 Anterior cervical decompression/diskectomy/fusion  . APPENDECTOMY  1960  . [place]  . JOINT REPLACEMENT     2   LEFT  1 RIGHT   . KNEE SURGERY    . ORIF FEMUR FRACTURE Left 03/24/2017   Procedure: OPEN REDUCTION INTERNAL FIXATION (ORIF) FEMUR FRACTURE;  Surgeon: Altamese [place], MD;  Location: [facility];  Service: Orthopedics;  Laterality: Left;  . ROTATOR CUFF REPAIR Left 2005   2 LEFT  1  RIGHT  . SHOULDER ADHESION RELEASE    . SPINE SURGERY    . TOE SURGERY     LEFT       . TOE SURGERY     RIGHT  BIG TOE  . TONSILLECTOMY  1960     OB History   No obstetric history on file.     Family History  Problem Relation Age of Onset  . Leukemia Sister   . Lupus Sister   . Depression Sister   . Rheum arthritis Sister   . Heart disease Father   . Hypertension Father   . Hyperlipidemia Father   . Neuropathy Father   . Cancer Mother   . Diabetes Mother   . Osteoporosis Mother   . Breast cancer Mother   . Migraines Daughter     Social History   Tobacco Use  . Smoking status: Never Smoker  . Smokeless tobacco: Never Used  Vaping Use  . Vaping Use: Never used  Substance Use Topics  . Alcohol use: Yes    Alcohol/week: 0.0 standard drinks    Comment: rarely  . Drug use: No    Home Medications Prior to Admission medications   Medication Sig Start Date End Date Taking? Authorizing Provider  Adalimumab 40 MG/0.4ML PNKT Inject 40 mg into the skin every 14 (fourteen) days.   11/04/18  Yes [provider]  ascorbic acid (VITAMIN C) 100 MG tablet Take 100 mg by mouth daily. "Gummy"   Yes [provider]  benzonatate (TESSALON) 200 MG capsule Take 1 cap three times daily as needed for cough. Patient taking differently: Take 200 mg by mouth 3 (three) times daily as needed for cough. 07/31/20  Yes [name], NP  bisoprolol (ZEBETA) 10 MG tablet Take    1 tablet    Daily       for BP Patient taking differently: Take 10 mg by mouth daily. 04/16/20  Yes Unk Pinto, MD  buPROPion (WELLBUTRIN XL) 150 MG 24 hr tablet Take    1 tablet    Daily     for Mood, Focus & Concentration Patient taking differently: Take 150 mg by mouth daily. 07/04/20  Yes  [name], NP  calcium carbonate (OS-CAL) 1250 (500 Ca) MG chewable tablet Chew 1 tablet by mouth daily.   Yes [provider]  carboxymethylcellulose (REFRESH PLUS) 0.5 % SOLN Place 1 drop into both eyes 3 (three) times daily as needed (dry eyes).   Yes [provider]  Cholecalciferol 125 MCG (5000 UT) TABS Take 5,000 Units by mouth daily.   Yes [provider]  clotrimazole (MYCELEX) 10 MG troche Take 1 tablet by mouth daily. 12/26/19  Yes [provider]  Cyanocobalamin (B-12 SL) Place 1 each under the tongue daily.    Yes [provider]  diclofenac Sodium (VOLTAREN) 1 % GEL Apply 4 g topically 4 (four) times daily. Patient taking differently: Apply 4 g topically 4 (four) times daily as needed (pain). 04/23/20  Yes Deno Etienne, DO  DULoxetine (CYMBALTA) 60 MG capsule Take 1 capsule Daily for Chronic Pain Patient taking differently: Take 60 mg by mouth daily. 05/15/19  Yes Unk Pinto, MD  famotidine (PEPCID) 40 MG tablet Take 40 mg by mouth 2 (two) times daily. 02/06/20  Yes [provider]  fenofibrate micronized (LOFIBRA) 134 MG capsule Take     1 capsule     Daily       for Triglycerides (Blood Fats) Patient taking differently: Take 134 mg by  mouth daily before breakfast. 06/28/20  Yes Unk Pinto, MD  fluconazole (DIFLUCAN) 150 MG tablet Take 150 mg by mouth See admin instructions. Sundays and Thursday   Yes [provider]  fluocinonide gel (LIDEX) 2.35 % Apply 1 application topically 2 (two) times daily as needed (infection). 04/30/20  Yes [provider]  fluticasone (FLONASE) 50 MCG/ACT nasal spray USE TWO SPRAYS IN EACH NOSTRIL DAILY AS NEEDED Patient taking differently: Place 2 sprays into both nostrils daily as needed for allergies. 03/12/20  Yes Vicie Mutters R, PA-C  gabapentin (NEURONTIN) 300 MG capsule TAKE 1 CAPSULE BY MOUTH 3 TIMES A DAY FOR CHRONIC PAIN Patient taking differently: Take 300 mg by mouth 3 (three) times daily. 01/11/20  Yes [name], NP  gemfibrozil (LOPID) 600 MG tablet Take     1 tablet     2 x /day     with Meals      for Cholesterol & Triglycerides Patient taking differently: Take 600 mg by mouth 2 (two) times daily before a meal. 07/10/20  Yes [name], NP  hydrocortisone 2.5 % ointment Apply 1 application topically daily as needed (itching).   Yes [provider]  Lactobacillus (PROBIOTIC ACIDOPHILUS PO) Take 1 tablet by mouth daily.   Yes [provider]  levothyroxine (SYNTHROID) 100 MCG tablet TAKE 1 TABLET BY MOUTH ON TUES, WED, THURS, SAT AND SUN. TAKE 1& 1/2 TABLET ON MON AND FRI Patient taking differently: Take 100 mcg by mouth daily. 07/15/19  Yes [name], NP  meclizine (ANTIVERT) 25 MG tablet TAKE 1 TABLET (25 MG TOTAL) BY MOUTH 3 (THREE) TIMES DAILY AS NEEDED FOR DIZZINESS. 06/13/18  Yes Unk Pinto, MD  metroNIDAZOLE (FLAGYL) 500 MG tablet Take 500 mg by mouth in the morning and at bedtime.    Yes [provider]  multivitamin (VIT W/EXTRA C) CHEW chewable tablet Chew 1 tablet by mouth daily.   Yes [provider]  nystatin (MYCOSTATIN) 100000 UNIT/ML suspension Take 15 mLs by mouth daily as needed (tooth  infections).  02/06/20  Yes [provider]  oxybutynin (DITROPAN) 5 MG tablet TAKE 1 TABLET TWICE A DAY FOR BLADDER  CONTROL Patient taking differently: Take 5 mg by mouth 2 (two) times daily. 07/10/20  Yes [name], NP  rOPINIRole (REQUIP) 2 MG tablet TAKE 1 TABLET FOUR TIMES A DAY FOR RESTLESS LEGS Patient taking differently: Take 2 mg by mouth in the morning, at noon, in the evening, and at bedtime. 04/14/20  Yes Unk Pinto, MD  tiZANidine (ZANAFLEX) 2 MG tablet TAKE 1/2 TO 1 TABLET 3 TIMES A DAY AS NEEDED FOR MUSCLE SPASMS Patient taking differently: Take 2 mg by mouth daily as needed for muscle spasms. 09/11/20  Yes McClanahan, Danton Sewer, NP  traMADol (ULTRAM) 50 MG tablet Take 50 mg by mouth every 6 (six) hours as needed for moderate pain. 08/16/20  Yes [provider]  Bempedoic Acid (NEXLETOL) 180 MG TABS Take 1 tablet by mouth daily. Patient not taking: No sig reported 07/05/20   [name], NP  olmesartan (BENICAR) 40 MG tablet TAKE 1/2 TO 1 TABLET AT NIGHT FOR BLOOD PRESSURE Patient not taking: No sig reported 08/30/20   [name], NP    Allergies    Atorvastatin, Codeine, Pravastatin, Statins, Amitriptyline, Carbidopa, Fish oil, Hydrocodone, Linseed oil, Nuvigil [armodafinil], Sulfa antibiotics, Erythromycin, Erythromycin base, Flax seed [bio-flax], Hydrocodone-acetaminophen, Morphine, Nsaids, Sertraline, Sinemet [carbidopa w-levodopa], Sulfamethoxazole, and Tolmetin  Review of Systems   Review of Systems  Constitutional: Negative for chills and fever.  HENT: Positive for congestion, postnasal drip, rhinorrhea, sinus pressure and sinus pain. Negative for sore throat and trouble swallowing.   Eyes: Negative for discharge and itching.  Respiratory: Positive for cough and shortness of breath (mild, intermittent). Negative for chest tightness and wheezing.   Cardiovascular: Positive for chest pain (with cough and deep breath) and leg swelling. Negative for  palpitations.  Gastrointestinal: Positive for constipation, nausea and vomiting. Negative for abdominal pain and diarrhea.  Genitourinary: Negative for difficulty urinating, dysuria and frequency.  Skin: Negative for color change and rash.  Allergic/Immunologic: Positive for environmental allergies.  Neurological: Positive for numbness (chronic, to L thumb) and headaches (over frontal sinuses). Negative for dizziness, weakness and light-headedness.  All other systems reviewed and are negative.   Physical Exam Updated Vital Signs BP 121/72   Pulse 99   Temp 98.6 F (37 C) (Oral)   Resp 16   Ht 5' 3.5" (1.613 m)   Wt 69.6 kg   SpO2 99%   BMI 26.76 kg/m   Physical Exam Vitals and nursing note reviewed.  Constitutional:      General: She is not in acute distress.    Appearance: Normal appearance. She is not ill-appearing.  HENT:     Head: Normocephalic and atraumatic.     Right Ear: External ear normal.     Left Ear: External ear normal.     Nose: Nose normal.     Mouth/Throat:     Mouth: Mucous membranes are moist.     Pharynx: Posterior oropharyngeal erythema present. No oropharyngeal exudate.     Comments: Cobblestoning to posterior pharynx Eyes:     General:        Right eye: No discharge.        Left eye: No discharge.     Conjunctiva/sclera: Conjunctivae normal.     Comments: R eye ptosis  Cardiovascular:     Rate and Rhythm: Regular rhythm. Tachycardia present.     Heart sounds: Normal heart sounds. No murmur heard. No friction rub. No gallop.   Pulmonary:     Effort: Pulmonary effort is normal. No respiratory distress.  Breath sounds: Normal breath sounds. No wheezing, rhonchi or rales.  Abdominal:     General: Abdomen is flat.     Palpations: Abdomen is soft.     Tenderness: There is no abdominal tenderness.  Musculoskeletal:        General: No deformity or signs of injury. Normal range of motion.     Cervical back: Normal range of motion and neck  supple.  Lymphadenopathy:     Cervical: Cervical adenopathy (shotty cervical lymphadenopathy) present.  Skin:    General: Skin is warm and dry.  Neurological:     General: No focal deficit present.     Mental Status: She is alert and oriented to person, place, and time.  Psychiatric:     Comments: Appears anxious and moderately hyperactive     ED Results / Procedures / Treatments   Labs (all labs ordered are listed, but only abnormal results are displayed) Labs Reviewed  D-DIMER, QUANTITATIVE - Abnormal; Notable for the following components:      Result Value   D-Dimer, Quant 1.75 (*)    All other components within normal limits  COMPREHENSIVE METABOLIC PANEL - Abnormal; Notable for the following components:   Glucose, Bld 101 (*)    All other components within normal limits  RESP PANEL BY RT-PCR (FLU A&B, COVID) ARPGX2  CBC WITH DIFFERENTIAL/PLATELET  LIPASE, BLOOD  TROPONIN I (HIGH SENSITIVITY)    EKG EKG Interpretation  Date/Time:  Wednesday September 12 2020 17:31:08 EDT Ventricular Rate:  94 PR Interval:  184 QRS Duration: 70 QT Interval:  366 QTC Calculation: 457 R Axis:   -30 Text Interpretation: Normal sinus rhythm Left axis deviation Low voltage QRS Inferior infarct , age undetermined Cannot rule out Anterior infarct , age undetermined Abnormal ECG When compared toprior, similar appearance. NO STEMI Confirmed by Antony Blackbird [phone]) on 09/12/2020 6:04:13 PM   Radiology DG Chest 2 View  Result Date: 09/12/2020 CLINICAL DATA:  Cough. EXAM: CHEST - 2 VIEW COMPARISON:  August 29, 2020. FINDINGS: The heart size and mediastinal contours are within normal limits. Both lungs are clear. The visualized skeletal structures are unremarkable. IMPRESSION: No active cardiopulmonary disease. Electronically Signed   By: Marijo Conception M.D.   On: 09/12/2020 14:40   CT Angio Chest PE W and/or Wo Contrast  Result Date: 09/12/2020 CLINICAL DATA:  PE suspected, low/intermediate prob,  positive D-dimer Cough.  Hypertension. EXAM: CT ANGIOGRAPHY CHEST WITH CONTRAST TECHNIQUE: Multidetector CT imaging of the chest was performed using the standard protocol during bolus administration of intravenous contrast. Multiplanar CT image reconstructions and MIPs were obtained to evaluate the vascular anatomy. CONTRAST:  23m OMNIPAQUE IOHEXOL 350 MG/ML SOLN COMPARISON:  Radiograph earlier today.  Chest CT 01/20/2019 FINDINGS: Cardiovascular: There are no filling defects within the pulmonary arteries to suggest pulmonary embolus. Normal caliber thoracic aorta without dissection or acute aortic findings. Mild aortic tortuosity. Heart is normal in size. No pericardial effusion. Mediastinum/Nodes: No enlarged mediastinal or hilar lymph nodes. No esophageal wall thickening. No visualized thyroid nodule. Lungs/Pleura: Mild basilar breathing motion artifact. Previous bronchiectasis is obscured by motion. Mild hypoventilatory changes in scarring in both lower lobes. Minor lingular scarring. No acute airspace disease or pneumonia. No pleural fluid. No findings of pulmonary edema. Upper Abdomen: No acute or unexpected finding. Musculoskeletal: There are no acute or suspicious osseous abnormalities. Right shoulder arthroplasty. Surgical hardware in the cervical spine is partially included. Left shoulder osteoarthritis. No chest wall abnormality. Review of the MIP images confirms the  above findings. IMPRESSION: 1. No pulmonary embolus or acute intrathoracic abnormality. 2. Previous bronchiectasis is obscured by motion artifact. Mild bibasilar scarring. Electronically Signed   By: Keith Rake M.D.   On: 09/12/2020 20:36    Medications Ordered in ED Medications  iohexol (OMNIPAQUE) 350 MG/ML injection 75 mL (75 mLs Intravenous Contrast Given 09/12/20 2003)    ED Course  I have reviewed the triage vital signs and the nursing notes.  Pertinent labs & imaging results that were available during my care of the  patient were reviewed by me and considered in my medical decision making (see chart for details).    MDM Rules/Calculators/A&P                         Due to complains of pleuritic chest pain and shortness of breath along with tachycardia, patient was considered to be at risk of pulmonary embolism or ASCVD. D-dimer was elevated to 1.76 but CTA without PE. Troponin negative. Respiratory panel negative for COVID and flu. Other labs also reassuring with normal blood counts, electrolytes, renal function.  BP at time of discharge was 121/72. Pulse still elevated to 99, likely due to cough and anxiety.   Exam and history consistent with postnasal drip as likely contributor to her chronic cough. She is already prescribed fluticasone but has been using it twice a week, may have misunderstood instructions on this prescription. Also using saline spray.  Discussed results with patient who understands. Instructed to take her already prescribed fluticasone twice a day rather than twice a week, and also to use [employer]. She will do this and follow up with her PCP. Return precautions given.   Final Clinical Impression(s) / ED Diagnoses Final diagnoses:  Cough  Hypertension, unspecified type    Rx / DC Orders ED Discharge Orders    None       Andrew Au, MD 09/12/20 2210    Tegeler, Gwenyth Allegra, MD 09/15/20 [phone]

## 2020-09-12 NOTE — ED Triage Notes (Signed)
Pt arrived via walk in, c/o worsening hypertension the last couple days and cough since beginning of the month.

## 2020-09-15 NOTE — Progress Notes (Signed)
History of Present Illness:     Patient is a very nice 70 yo [name] who was seen recently in ER on  2sd March for cough and SOB and CXR  & Covid tests were negative, O2 sat was 99% and she was released and advised to use cough drops for her cough. She was then recently seen a 2sd time on 16th  March for mildly elevated BP and CP w/cough. EKG, Troponins , CXR , Chest CTA, CBC, Renal profile, Respiratory Panel for Covid & Flu were Negative. She was released & advised to increase her gFlonase from 2 x /week to 2 x /day as prescribed. She presents today for ER f/u. Today she presents with c/o cough productive of a thick yellow sputum, denies fever or dyspnea. Also she's c/o sinus pressure & congestion. She also reports that her BP's have been varying from high to low - occasionally less than 277 systolic. Also she mentions her position vertigo /dizziness/lightheadedness exacerbates when she sits up or stands.   Medications  .  levothyroxine 100 MCG tablet, Take 100 mcg by mouth daily  .  bisoprolol  10 MG tablet, Take 1 tablet Daily for BP  .  fenofibrate  134 MG capsule, Take 1 capsule Daily   for  .  gemfibrozil  600 MG tablet, Take 1 tablet 2 x /day with Meals        .  benzonatate (TESSALON) 200 MG capsule, Take 1 cap three times daily as needed for cough.   Asencion Islam  nasal spray, USE TWO SPRAYS IN EACH NOSTRIL DAILY AS NEEDED   .  Adalimumab 40 MG/0.4ML PNKT, Inject 40 mg into the skin every 14 (fourteen) days.   .  traMADol  50 MG tablet, Take 50 mg every 6  hours as needed for moderate pain.  .  Cyanocobalamin (B-12 SL), Place 1 each under the tongue daily.   [name]  VITAMIN C)\ 100 MG tablet, Take 100 mg by mouth daily. "Gummy"  .  buPROPion XL 150 MG 24 hr tablet, Take    1 tablet    Daily       .  OS-CAL 1250 (500 Ca) MG chewable tablet, Chew 1 tablet  daily.  [name]  REFRESH PLUS   SOLN, Place 1 drop into both eyes 3  times daily as needed  .  Cholecalciferol 5000 u , Take 5,000 Units   daily. [name]  MYCELEX 10 MG troche, Take 1 tablet daily. .  diclofenac  1 % GEL, Apply 4 g topically 4  times daily as needed  .  DULoxetine 60 MG capsule, Take 1 capsule Daily for Chronic Pain  .  famotidine  40 MG tablet, Take  2 times daily. [name]  DIFLUCAN 150 MG tablet, Take 150 mg  See admin instructions. Sundays and Thursday  .  LIDEX 0.05 %, Apply  2  times daily as needed  .  gabapentin 300 MG capsule, TAKE 1 CAPSULE  3 TIMES A DAY FOR CHRONIC PAIN  .  hydrocortisone 2.5 % ointment, Apply topically daily as needed  .  Lactobacillus (PROBIOTIC), Take 1 tablet  daily. .  meclizine  25 MG tablet, TAKE 1 TABLET  3  TIMES DAILY AS NEEDED  .  metroNIDAZOLE 500 MG tablet, Take  in the morning and at bedtime.  .  multivitamin (VIT W/EXTRA C) CHEW chewable tablet, Chew 1 tablet  daily. [name]  MYCOSTATIN  100,000 UNIT/ML suspension, Take 15 mLs by mouth  daily as needed  .  oxybutynin  5 MG tablet, TAKE 1 TABLET TWICE A DAY FOR BLADDER CONTROL .  rOPINIRole  2 MG tablet, TAKE 1 TABLET FOUR TIMES A DAY  .  tiZANidine 2 MG tablet, TAKE 1/2 TO 1 TABLET 3 TIMES A DAY AS NEEDED   Problem list She has Obstructive sleep apnea; RESTLESS LEG SYNDROME; Essential hypertension; Rhinitis; GERD; Crohn's Enterocolitis / Regional enteritis; Fibromyalgia; Gait disorder; Hyperlipidemia associated with type 2 diabetes mellitus ([place]); Hypothyroid; Depression, major, recurrent, in partial remission ([place]); Vitamin D deficiency; ASCVD; Medication management; Rotator cuff arthropathy; Obesity (BMI 30.0-34.9); Spinal stenosis in cervical region; IBS (irritable colon syndrome); Sleep talking; B12 deficiency anemia; Polypharmacy; Bronchiectasis without complication ([place]); Arthritis of carpometacarpal (CMC) joints of both thumbs; Bilateral bunions; Cervical disc disease; Constipation; Crohn's ileitis ([place]); Rotator cuff tear; Diverticulosis; History of adenomatous polyp of colon; Internal hemorrhoids; Knee pain; Other specified  arthropathy involving shoulder region; S/P revision of total knee, left; Diet-controlled type 2 diabetes mellitus ([place]); Rupture of popliteal cyst of right knee region (complex cyst); History of recurrent TIAs; COVID-19 virus infection; CKD stage 2 due to type 2 diabetes mellitus ([place]); History of total knee arthroplasty, right; Pain due to total right knee replacement ([place]); Presence of artificial knee joint, right; and Aortic atherosclerosis ([facility]) on their problem list.   Observations/Objective:   BP 104/66   Pulse 80   Temp (!) 97.5 F (36.4 C)   Resp 16   Ht 5' 2"  (1.575 m)   Wt 153 lb 12.8 oz (69.8 kg)   SpO2 99%   BMI 28.13 kg/m   BP rechecked 116/66.  Congested cough  HEENT - EAC's patent Tm's slightly retracted /Nl color. (+) Frontal & Maxillary tenderness.  Rt ptosis - unchanged. EOM's Nl. PERRLA. N/O/P - clear  Neck - supple.  Chest - Clear equal BS. Cor - Nl HS. RRR w/o sig MGR. PP 1(+). No edema. MS- FROM w/o deformities.  Gait Nl. Neuro -  Nl w/o focal abnormalities.  Assessment and Plan:  1. Pansinusitis, unspecified chronicity  - doxycycline (VIBRAMYCIN) 100 MG capsule; Take  1 capsule  2 x /day  with Meals for Infection  Dispense: 60 capsule; Refill: 0  2. Tracheitis  - doxycycline (VIBRAMYCIN) 100 MG capsule; Take  1 capsule  2 x /day  with Meals for Infection  Dispense: 60 capsule; Refill: 0  - Discussed with patient that if her cough does not improve Chesley Noon then recommend that she see Dr Halford Chessman again.   3. Labile Hypertension  - Recommended that she decrease her Olmesartan & Bisoprolol to 1/2 tabs and continue monitoring  BP's in hopes that it may help with her "positional" /postural   dizziness /lightheadedness.   Follow Up Instructions:       I discussed the assessment and treatment plan with the patient. The patient was provided an opportunity to ask questions and all were answered. The patient agreed with the plan and demonstrated an understanding  of the instructions.        The patient was advised to call back or seek an in-person evaluation if the symptoms worsen or if the condition fails to improve as anticipated.   Kirtland Bouchard, MD

## 2020-09-16 ENCOUNTER — Encounter: Payer: Self-pay | Admitting: Internal Medicine

## 2020-09-17 ENCOUNTER — Other Ambulatory Visit: Payer: Self-pay

## 2020-09-17 ENCOUNTER — Ambulatory Visit: Payer: Medicare PPO | Admitting: Internal Medicine

## 2020-09-17 VITALS — BP 104/66 | HR 80 | Temp 97.5°F | Resp 16 | Ht 62.0 in | Wt 153.8 lb

## 2020-09-17 DIAGNOSIS — R0989 Other specified symptoms and signs involving the circulatory and respiratory systems: Secondary | ICD-10-CM | POA: Diagnosis not present

## 2020-09-17 DIAGNOSIS — J0410 Acute tracheitis without obstruction: Secondary | ICD-10-CM

## 2020-09-17 DIAGNOSIS — J324 Chronic pansinusitis: Secondary | ICD-10-CM

## 2020-09-17 MED ORDER — DOXYCYCLINE HYCLATE 100 MG PO CAPS: ORAL_CAPSULE | ORAL | 0 refills | Status: DC

## 2020-09-18 DIAGNOSIS — M79605 Pain in left leg: Secondary | ICD-10-CM | POA: Diagnosis not present

## 2020-09-18 DIAGNOSIS — R531 Weakness: Secondary | ICD-10-CM | POA: Diagnosis not present

## 2020-09-18 DIAGNOSIS — M48062 Spinal stenosis, lumbar region with neurogenic claudication: Secondary | ICD-10-CM | POA: Diagnosis not present

## 2020-09-18 DIAGNOSIS — M79604 Pain in right leg: Secondary | ICD-10-CM | POA: Diagnosis not present

## 2020-09-24 DIAGNOSIS — R531 Weakness: Secondary | ICD-10-CM | POA: Diagnosis not present

## 2020-09-24 DIAGNOSIS — M79605 Pain in left leg: Secondary | ICD-10-CM | POA: Diagnosis not present

## 2020-09-24 DIAGNOSIS — M79604 Pain in right leg: Secondary | ICD-10-CM | POA: Diagnosis not present

## 2020-09-24 DIAGNOSIS — M48062 Spinal stenosis, lumbar region with neurogenic claudication: Secondary | ICD-10-CM | POA: Diagnosis not present

## 2020-09-25 LAB — HM DIABETES EYE EXAM

## 2020-09-25 NOTE — Progress Notes (Signed)
Future Appointments  Date Time Provider [place]  10/17/2020 11:30 AM Crystal Pinto, MD [facility] None  04/08/2021 10:00 AM Crystal Pinto, MD [facility] None  07/09/2021 11:15 AM Crystal Comber, NP [facility] None     History of Present Illness:      Patient is a very nice 70 yo [name] with  HTN, HLD, Diet T2_NIDDM,GERD, Crohn's Dz, Hypothyroidism, OSA/BiPAPand Vitamin D Deficiency & s/p multiple Orthopedic & other surgeries  who presents with c/o Rt Buttock to popliteal pain  & since here this am is also c/o Rt calf pains. Still c/o postural "dizziness" despite cutting her Bisoprolol & Olmesartan in 1/2 at recent last OV about 10 days ago.       Medications  .  levothyroxine (SYNTHROID) 100 MCG tablet, TAKE 1 TABLET BY MOUTH ON TUES, WED, THURS, SAT AND SUN. TAKE 1& 1/2 TABLET ON MON AND FRI (Patient taking differently: Take 100 mcg by mouth daily.)  .  bisoprolol (ZEBETA) 10 MG tablet, Take    1 tablet    Daily       for BP (Patient taking differently: Take 5 mg by mouth qam.)    Olmesartan (Benicar) 40 mg - Taking 1/2 tab q pm   .  fenofibrate micronized (LOFIBRA) 134 MG capsule, Take     1 capsule     Daily       for Triglycerides (Blood Fats) (Patient taking differently: Take 134 mg by mouth daily before breakfast.)  .  gemfibrozil (LOPID) 600 MG tablet, Take     1 tablet     2 x /day     with Meals      for Cholesterol & Triglycerides (Patient taking differently: Take 600 mg by mouth 2 (two) times daily before a meal.)  .  benzonatate (TESSALON) 200 MG capsule, Take 1 cap three times daily as needed for cough. (Patient taking differently: Take 200 mg by mouth 3 (three) times daily as needed for cough.)  .  fluticasone (FLONASE) 50 MCG/ACT nasal spray, USE TWO SPRAYS IN EACH NOSTRIL DAILY AS NEEDED (Patient taking differently: Place 2 sprays into both nostrils daily as needed for allergies.)  .  Adalimumab 40 MG/0.4ML PNKT, Inject 40 mg into the skin every 14  (fourteen) days.   .  traMADol (ULTRAM) 50 MG tablet, Take 50 mg by mouth every 6 (six) hours as needed for moderate pain. .  Cyanocobalamin (B-12 SL), Place 1 each under the tongue daily .  ascorbic acid (VITAMIN C) 100 MG tablet, Take 100 mg by mouth daily. "Gumm .  buPROPion (WELLBUTRIN XL) 150 MG 24 hr tablet, Take    1 tablet    Daily     for Mood, Focus & Concentration (Patient taking differently: Take 150 mg by mouth daily.) .  calcium carbonate (OS-CAL) 1250 (500 Ca) MG chewable tablet, Chew 1 tablet by mouth daily. .  carboxymethylcellulose (REFRESH PLUS) 0.5 % SOLN, Place 1 drop into both eyes 3 (three) times daily as needed (dry eyes). .  Cholecalciferol 125 MCG (5000 UT) TABS, Take 5,000 Units by mouth daily. .  clotrimazole (MYCELEX) 10 MG troche, Take 1 tablet by mouth daily. .  diclofenac Sodium (VOLTAREN) 1 % GEL, Apply 4 g topically 4 (four) times daily. (Patient taking differently: Apply 4 g topically 4 (four) times daily as needed (pain).)  .  doxycycline (VIBRAMYCIN) 100 MG capsule, Take  1 capsule  2 x /day  with Meals for Infection  .  DULoxetine (  CYMBALTA) 60 MG capsule, Take 1 capsule Daily for Chronic Pain (Patient taking differently: Take 60 mg by mouth daily.) .  famotidine (PEPCID) 40 MG tablet, Take 40 mg by mouth 2 (two) times daily. .  fluconazole (DIFLUCAN) 150 MG tablet, Take 150 mg by mouth See admin instructions. Sundays and Thursday .  fluocinonide gel (LIDEX) 3.24 %, Apply 1 application topically 2 (two) times daily as needed (infection). .  gabapentin (NEURONTIN) 300 MG capsule, TAKE 1 CAPSULE BY MOUTH 3 TIMES A DAY FOR CHRONIC PAIN  .  hydrocortisone 2.5 % ointment, Apply 1 application topically daily as needed (itching). .  Lactobacillus (PROBIOTIC ACIDOPHILUS PO), Take 1 tablet by mouth daily. .  meclizine (ANTIVERT) 25 MG tablet, TAKE 1 TABLET (25 MG TOTAL) BY MOUTH 3 (THREE) TIMES DAILY AS NEEDED FOR DIZZINESS .  multivitamin (VIT W/EXTRA C) CHEW  chewable tablet, Chew 1 tablet by mouth daily. [name]  nystatin (MYCOSTATIN) 100000 UNIT/ML suspension, Take 15 mLs by mouth daily as needed (tooth infections).   [name]  oxybutynin (DITROPAN) 5 MG tablet, TAKE 1 TABLET TWICE A DAY FOR BLADDER CONTROL (Patient taking differently: Take 5 mg by mouth 2 (two) times daily.)  .  rOPINIRole (REQUIP) 2 MG tablet, TAKE 1 TABLET FOUR TIMES A DAY FOR RESTLESS LEGS (Patient taking differently: Take 2 mg by mouth in the morning, at noon, in the evening, and at bedtime.)  .  tiZANidine (ZANAFLEX) 2 MG tablet, TAKE 1/2 TO 1 TABLET 3 TIMES A DAY AS NEEDED FOR MUSCLE SPASMS (Patient taking differently: Take 2 mg by mouth daily as needed for muscle spasms.)  Problem list She has Obstructive sleep apnea; RESTLESS LEG SYNDROME; Essential hypertension; Rhinitis; GERD; Crohn's Enterocolitis / Regional enteritis; Fibromyalgia; Gait disorder; Hyperlipidemia associated with type 2 diabetes mellitus ([place]); Hypothyroid; Depression, major, recurrent, in partial remission ([place]); Vitamin D deficiency; ASCVD; Medication management; Rotator cuff arthropathy; Obesity (BMI 30.0-34.9); Spinal stenosis in cervical region; IBS (irritable colon syndrome); Sleep talking; B12 deficiency anemia; Polypharmacy; Bronchiectasis without complication ([place]); Arthritis of carpometacarpal (CMC) joints of both thumbs; Bilateral bunions; Cervical disc disease; Constipation; Crohn's ileitis ([place]); Rotator cuff tear; Diverticulosis; History of adenomatous polyp of colon; Internal hemorrhoids; Knee pain; Other specified arthropathy involving shoulder region; S/P revision of total knee, left; Diet-controlled type 2 diabetes mellitus ([place]); Rupture of popliteal cyst of right knee region (complex cyst); History of recurrent TIAs; COVID-19 virus infection; CKD stage 2 due to type 2 diabetes mellitus ([place]); History of total knee arthroplasty, right; Pain due to total right knee replacement ([place]); Presence of artificial knee  joint, right; and Aortic atherosclerosis ([place]) by Chest CT scan on 09/2017 on their problem list.   Observations/Objective:   Pulse 72   Temp 97.7 F (36.5 C)   Resp 16   Ht 5' 2"  (1.575 m)   Wt 153 lb 12.8 oz (69.8 kg)   SpO2 97%   BMI 28.13 kg/m   BP by wall Arm Cuff  Sitting BP 110/70   P ~70     &    Standing   BP 90/60     P ~ 70   HEENT - WNL. Rt facial palsy (ptosis) (residua of remote Bell's)  Neck - supple.  Chest -  Few scattered rale & rhonchi. No Wheezes Cor - Nl HS. RRR w/o sig MGR. PP 1(+). No edema. Abd - Soft . NT . MS-  Bilat Hip rom & SLR - Negative. Rt Ischial bursa 2 + /10 - tender. Rt  Greater Tronchanteric Bursa 7+/10 - tender . Also tender of muscle Bell of Flexor muscle bellies of Rt posterior thigh . Gait supported by Gilford Rile for balance .  Neuro -  Nl w/o focal abnormalities.  Assessment and Plan:  1. Postural hypotension  - Advised  Stop Bisoprolol & Olmesartan completely  - Recommended husband transport to [facility] ER for  labs to include CBC, CMET & D-Dimer  &  also consider imaging of Rt thigh muscles /soft tissue  2. Trochanteric bursitis of right hip  - Consider steroid bursal injection.  3. Acute pain of right thigh   Follow Up Instructions:        I discussed the assessment and treatment plan with the patient & husband. . The patient was provided an opportunity to ask questions and all were answered. They agreed with the plan and demonstrated an understanding of the instructions.       They wer advised to call back or seek an in-person evaluation if the symptoms worsen or if the condition fails to improve as anticipated. Approx. 35-40 minutes of counseling, chart review and critical decision making was performed   Kirtland Bouchard, MD

## 2020-09-26 ENCOUNTER — Encounter: Payer: Self-pay | Admitting: Internal Medicine

## 2020-09-26 ENCOUNTER — Emergency Department (HOSPITAL_BASED_OUTPATIENT_CLINIC_OR_DEPARTMENT_OTHER)
Admission: EM | Admit: 2020-09-26 | Discharge: 2020-09-26 | Disposition: A | Discharge status: Home / Self Care | Payer: Medicare PPO | Attending: Emergency Medicine | Admitting: Emergency Medicine

## 2020-09-26 ENCOUNTER — Ambulatory Visit: Payer: Medicare PPO | Admitting: Internal Medicine

## 2020-09-26 ENCOUNTER — Other Ambulatory Visit: Payer: Self-pay

## 2020-09-26 ENCOUNTER — Emergency Department (HOSPITAL_BASED_OUTPATIENT_CLINIC_OR_DEPARTMENT_OTHER): Payer: Medicare PPO

## 2020-09-26 ENCOUNTER — Emergency Department (HOSPITAL_BASED_OUTPATIENT_CLINIC_OR_DEPARTMENT_OTHER): Payer: Medicare PPO | Admitting: Radiology

## 2020-09-26 ENCOUNTER — Encounter (HOSPITAL_BASED_OUTPATIENT_CLINIC_OR_DEPARTMENT_OTHER): Payer: Self-pay | Admitting: Emergency Medicine

## 2020-09-26 VITALS — HR 72 | Temp 97.7°F | Resp 16 | Ht 62.0 in | Wt 153.8 lb

## 2020-09-26 DIAGNOSIS — R059 Cough, unspecified: Secondary | ICD-10-CM | POA: Diagnosis not present

## 2020-09-26 DIAGNOSIS — R0602 Shortness of breath: Secondary | ICD-10-CM

## 2020-09-26 DIAGNOSIS — M25551 Pain in right hip: Secondary | ICD-10-CM | POA: Insufficient documentation

## 2020-09-26 DIAGNOSIS — I129 Hypertensive chronic kidney disease with stage 1 through stage 4 chronic kidney disease, or unspecified chronic kidney disease: Secondary | ICD-10-CM | POA: Insufficient documentation

## 2020-09-26 DIAGNOSIS — Z8616 Personal history of COVID-19: Secondary | ICD-10-CM | POA: Insufficient documentation

## 2020-09-26 DIAGNOSIS — J069 Acute upper respiratory infection, unspecified: Secondary | ICD-10-CM | POA: Diagnosis not present

## 2020-09-26 DIAGNOSIS — I951 Orthostatic hypotension: Secondary | ICD-10-CM

## 2020-09-26 DIAGNOSIS — N182 Chronic kidney disease, stage 2 (mild): Secondary | ICD-10-CM | POA: Diagnosis not present

## 2020-09-26 DIAGNOSIS — M4326 Fusion of spine, lumbar region: Secondary | ICD-10-CM | POA: Diagnosis not present

## 2020-09-26 DIAGNOSIS — R42 Dizziness and giddiness: Secondary | ICD-10-CM | POA: Diagnosis present

## 2020-09-26 DIAGNOSIS — Z96651 Presence of right artificial knee joint: Secondary | ICD-10-CM | POA: Insufficient documentation

## 2020-09-26 DIAGNOSIS — E039 Hypothyroidism, unspecified: Secondary | ICD-10-CM | POA: Diagnosis not present

## 2020-09-26 DIAGNOSIS — Z79899 Other long term (current) drug therapy: Secondary | ICD-10-CM | POA: Diagnosis not present

## 2020-09-26 DIAGNOSIS — M791 Myalgia, unspecified site: Secondary | ICD-10-CM | POA: Insufficient documentation

## 2020-09-26 DIAGNOSIS — M79651 Pain in right thigh: Secondary | ICD-10-CM

## 2020-09-26 DIAGNOSIS — M7061 Trochanteric bursitis, right hip: Secondary | ICD-10-CM

## 2020-09-26 DIAGNOSIS — Z981 Arthrodesis status: Secondary | ICD-10-CM | POA: Diagnosis not present

## 2020-09-26 DIAGNOSIS — R0981 Nasal congestion: Secondary | ICD-10-CM | POA: Insufficient documentation

## 2020-09-26 DIAGNOSIS — M1611 Unilateral primary osteoarthritis, right hip: Secondary | ICD-10-CM | POA: Diagnosis not present

## 2020-09-26 LAB — COMPREHENSIVE METABOLIC PANEL
ALT: 10 U/L (ref 0–44)
AST: 17 U/L (ref 15–41)
Albumin: 4 g/dL (ref 3.5–5.0)
Alkaline Phosphatase: 36 U/L — ABNORMAL LOW (ref 38–126)
Anion gap: 9 (ref 5–15)
BUN: 22 mg/dL (ref 8–23)
CO2: 28 mmol/L (ref 22–32)
Calcium: 9.3 mg/dL (ref 8.9–10.3)
Chloride: 102 mmol/L (ref 98–111)
Creatinine, Ser: 0.99 mg/dL (ref 0.44–1.00)
GFR, Estimated: >60 mL/min (ref >60)
Glucose, Bld: 86 mg/dL (ref 70–99)
Potassium: 3.8 mmol/L (ref 3.5–5.1)
Sodium: 139 mmol/L (ref 135–145)
Total Bilirubin: 0.7 mg/dL (ref 0.3–1.2)
Total Protein: 6.6 g/dL (ref 6.5–8.1)

## 2020-09-26 LAB — CBC WITH DIFFERENTIAL/PLATELET
Abs Immature Granulocytes: 0.05 10*3/uL (ref 0.00–0.07)
Basophils Absolute: 0.1 10*3/uL (ref 0.0–0.1)
Basophils Relative: 1 %
Eosinophils Absolute: 0.1 10*3/uL (ref 0.0–0.5)
Eosinophils Relative: 2 %
HCT: 45.1 % (ref 36.0–46.0)
Hemoglobin: 14.3 g/dL (ref 12.0–15.0)
Immature Granulocytes: 1 %
Lymphocytes Relative: 19 %
Lymphs Abs: 1.1 10*3/uL (ref 0.7–4.0)
MCH: 30.6 pg (ref 26.0–34.0)
MCHC: 31.7 g/dL (ref 30.0–36.0)
MCV: 96.4 fL (ref 80.0–100.0)
Monocytes Absolute: 0.6 10*3/uL (ref 0.1–1.0)
Monocytes Relative: 10 %
Neutro Abs: 4.1 10*3/uL (ref 1.7–7.7)
Neutrophils Relative %: 67 %
Platelets: 241 10*3/uL (ref 150–400)
RBC: 4.68 MIL/uL (ref 3.87–5.11)
RDW: 12.5 % (ref 11.5–15.5)
WBC: 6 10*3/uL (ref 4.0–10.5)
nRBC: 0 % (ref 0.0–0.2)

## 2020-09-26 LAB — TROPONIN I (HIGH SENSITIVITY): Troponin I (High Sensitivity): <2 ng/L (ref <18)

## 2020-09-26 MED ORDER — SODIUM CHLORIDE 0.9 % IV BOLUS
1000.0000 mL | Freq: Once | INTRAVENOUS | Status: AC
  Administered 2020-09-26: 1000 mL via INTRAVENOUS

## 2020-09-26 NOTE — Discharge Instructions (Signed)
Follow up with your family doc. Return for worsening symptoms.  °

## 2020-09-26 NOTE — Progress Notes (Signed)
Patient O2 sats reading 89% on monitor. Pulse ox readjusted and O2 reading 97% on RA. All other vitals appropriate.

## 2020-09-26 NOTE — ED Triage Notes (Signed)
Sent by PCP for orthostatic hypotension, on new HTN  Meds. Alert and oriented x 4.

## 2020-09-26 NOTE — ED Provider Notes (Signed)
[name] Provider Note   CSN: [id number] Arrival date & time: 09/26/20  1221     History Chief Complaint  Patient presents with  . Hypotension    Crystal [name] is a 70 y.o. female.  70 yo F with a chief complaints of right hip pain and lightheadedness upon standing.  This is been going on for at least a week.  She denies any trauma to the hip.  She does have a history of low back pain with prior surgical intervention.  She feels like her pain from her low back usually cause her have cramping in her legs and not right-sided pain that radiates down the leg like she has currently.  She denies loss of bowel or bladder denies loss of perirectal sensation denies numbness or weakness to the leg.  She has had some mild cough and congestion.  She saw her family doctor today who had recently taken her off of some of her blood pressure medications and found her to be orthostatic.  Sent here for laboratory evaluation.  She denies any decreased oral intake.  Denies dark stools or blood in her stool.  Denies nausea vomiting or diarrhea.  She is taking medication for her cold symptoms.  Otherwise denies any new medications.  The history is provided by the patient and medical records.  Illness Severity:  Moderate Onset quality:  Gradual Duration:  2 weeks Timing:  Constant Progression:  Unchanged Chronicity:  New Associated symptoms: congestion, cough, myalgias and shortness of breath (when going up stairs)   Associated symptoms: no abdominal pain, no chest pain, no fever, no headaches, no nausea, no rhinorrhea, no vomiting and no wheezing        Past Medical History:  Diagnosis Date  . Arthritis   . ASCVD (arteriosclerotic cardiovascular disease)   . Closed nondisplaced subtrochanteric fracture of left femur ([place]) 09/03/2017   September 2018.  Status post surgery by Dr. Marcelino Scot  . Depression   . Fibromyalgia   . Gait disorder 10/26/2012  . GERD  (gastroesophageal reflux disease)   . GI bleed   . Hyperlipidemia   . Hypertension   . Hypothyroid   . Periprosthetic supracondylar fracture of femur, left  03/23/2017  . Restless leg syndrome   . Rhinitis 06/25/2010  . Sleep apnea   . TIA (transient ischemic attack)    3        . Vitamin D deficiency     Patient Active Problem List   Diagnosis Date Noted  . Aortic atherosclerosis ([place]) by Chest CT scan on 09/2017 07/05/2020  . CKD stage 2 due to type 2 diabetes mellitus ([place]) 11/16/2019  . COVID-19 virus infection 10/19/2019  . Presence of artificial knee joint, right 09/20/2019  . History of total knee arthroplasty, right 05/17/2019  . Pain due to total right knee replacement ([place]) 05/17/2019  . History of recurrent TIAs 11/15/2018  . Rupture of popliteal cyst of right knee region (complex cyst) 11/03/2018  . Diet-controlled type 2 diabetes mellitus ([place]) 07/04/2018  . History of adenomatous polyp of colon 10/18/2017  . Bronchiectasis without complication ([place]) 33/35/4562  . Diverticulosis 10/12/2017  . Internal hemorrhoids 10/12/2017  . Polypharmacy 04/09/2017  . Arthritis of carpometacarpal (CMC) joints of both thumbs 02/12/2017  . Cervical disc disease 01/23/2017  . Constipation 01/23/2017  . Crohn's ileitis ([place]) 01/23/2017  . S/P revision of total knee, left 12/18/2016  . B12 deficiency anemia 06/16/2016  . Sleep talking 03/26/2016  . IBS (  irritable colon syndrome) 08/09/2015  . Spinal stenosis in cervical region 06/14/2015  . Other specified arthropathy involving shoulder region 11/23/2014  . Obesity (BMI 30.0-34.9) 10/31/2014  . Medication management 10/25/2013  . Rotator cuff arthropathy 09/15/2013  . Hyperlipidemia associated with type 2 diabetes mellitus ([place])   . Hypothyroid   . Depression, major, recurrent, in partial remission ([place])   . Vitamin D deficiency   . ASCVD   . Gait disorder 10/26/2012  . Knee pain 09/07/2012  . Rotator cuff tear 08/23/2012  .  Bilateral bunions 11/19/2011  . Rhinitis 06/25/2010  . Obstructive sleep apnea 06/21/2010  . RESTLESS LEG SYNDROME 06/21/2010  . Essential hypertension 06/21/2010  . GERD 06/21/2010  . Crohn's Enterocolitis / Regional enteritis 06/21/2010  . Fibromyalgia 06/21/2010    Past Surgical History:  Procedure Laterality Date  . ABDOMINAL HYSTERECTOMY  1992  . ANTERIOR CERVICAL DECOMPRESSION/DISCECTOMY FUSION 4 LEVELS N/A 06/14/2015   Procedure: Cervical three-four Cervical four-five Cervical five- six Cervical six- seven  Anterior cervical decompression/diskectomy/fusion;  Surgeon: Erline Levine, MD;  Location: [facility];  Service: Neurosurgery;  Laterality: N/A;  C3-4 C4-5 C5-6 C6-7 Anterior cervical decompression/diskectomy/fusion  . APPENDECTOMY  1960  . [place]  . JOINT REPLACEMENT     2   LEFT  1 RIGHT   . KNEE SURGERY    . ORIF FEMUR FRACTURE Left 03/24/2017   Procedure: OPEN REDUCTION INTERNAL FIXATION (ORIF) FEMUR FRACTURE;  Surgeon: Altamese Saratoga, MD;  Location: [facility];  Service: Orthopedics;  Laterality: Left;  . ROTATOR CUFF REPAIR Left 2005   2 LEFT  1  RIGHT  . SHOULDER ADHESION RELEASE    . SPINE SURGERY    . TOE SURGERY     LEFT       . TOE SURGERY     RIGHT  BIG TOE  . TONSILLECTOMY  1960     OB History   No obstetric history on file.     Family History  Problem Relation Age of Onset  . Leukemia Sister   . Lupus Sister   . Depression Sister   . Rheum arthritis Sister   . Heart disease Father   . Hypertension Father   . Hyperlipidemia Father   . Neuropathy Father   . Cancer Mother   . Diabetes Mother   . Osteoporosis Mother   . Breast cancer Mother   . Migraines Daughter     Social History   Tobacco Use  . Smoking status: Never Smoker  . Smokeless tobacco: Never Used  Vaping Use  . Vaping Use: Never used  Substance Use Topics  . Alcohol use: Yes    Alcohol/week: 0.0 standard drinks    Comment: rarely  . Drug use: No     Home Medications Prior to Admission medications   Medication Sig Start Date End Date Taking? Authorizing Provider  Adalimumab 40 MG/0.4ML PNKT Inject 40 mg into the skin every 14 (fourteen) days.  11/04/18   [provider]  ascorbic acid (VITAMIN C) 100 MG tablet Take 100 mg by mouth daily. "Gummy"    [provider]  benzonatate (TESSALON) 200 MG capsule Take 1 cap three times daily as needed for cough. Patient taking differently: Take 200 mg by mouth 3 (three) times daily as needed for cough. 07/31/20   [name], NP  bisoprolol (ZEBETA) 10 MG tablet Take    1 tablet    Daily       for BP  Patient taking differently: Take 10 mg by mouth daily. 04/16/20   Unk Pinto, MD  buPROPion (WELLBUTRIN XL) 150 MG 24 hr tablet Take    1 tablet    Daily     for Mood, Focus & Concentration Patient taking differently: Take 150 mg by mouth daily. 07/04/20   [name], NP  calcium carbonate (OS-CAL) 1250 (500 Ca) MG chewable tablet Chew 1 tablet by mouth daily.    [provider]  carboxymethylcellulose (REFRESH PLUS) 0.5 % SOLN Place 1 drop into both eyes 3 (three) times daily as needed (dry eyes).    [provider]  Cholecalciferol 125 MCG (5000 UT) TABS Take 5,000 Units by mouth daily.    [provider]  clotrimazole (MYCELEX) 10 MG troche Take 1 tablet by mouth daily. 12/26/19   [provider]  Cyanocobalamin (B-12 SL) Place 1 each under the tongue daily.     [provider]  diclofenac Sodium (VOLTAREN) 1 % GEL Apply 4 g topically 4 (four) times daily. Patient taking differently: Apply 4 g topically 4 (four) times daily as needed (pain). 04/23/20   Deno Etienne, DO  doxycycline (VIBRAMYCIN) 100 MG capsule Take  1 capsule  2 x /day  with Meals for Infection 09/17/20   Unk Pinto, MD  DULoxetine (CYMBALTA) 60 MG capsule Take 1 capsule Daily for Chronic Pain Patient taking differently: Take 60 mg by mouth daily. 05/15/19    Unk Pinto, MD  famotidine (PEPCID) 40 MG tablet Take 40 mg by mouth 2 (two) times daily. 02/06/20   [provider]  fenofibrate micronized (LOFIBRA) 134 MG capsule Take     1 capsule     Daily       for Triglycerides (Blood Fats) Patient taking differently: Take 134 mg by mouth daily before breakfast. 06/28/20   Unk Pinto, MD  fluconazole (DIFLUCAN) 150 MG tablet Take 150 mg by mouth See admin instructions. Sundays and Thursday    [provider]  fluocinonide gel (LIDEX) 4.12 % Apply 1 application topically 2 (two) times daily as needed (infection). 04/30/20   [provider]  fluticasone (FLONASE) 50 MCG/ACT nasal spray USE TWO SPRAYS IN EACH NOSTRIL DAILY AS NEEDED Patient taking differently: Place 2 sprays into both nostrils daily as needed for allergies. 03/12/20   Vladimir Crofts, PA-C  gabapentin (NEURONTIN) 300 MG capsule TAKE 1 CAPSULE BY MOUTH 3 TIMES A DAY FOR CHRONIC PAIN Patient taking differently: Take 300 mg by mouth 3 (three) times daily. 01/11/20   [name], NP  gemfibrozil (LOPID) 600 MG tablet Take     1 tablet     2 x /day     with Meals      for Cholesterol & Triglycerides Patient taking differently: Take 600 mg by mouth 2 (two) times daily before a meal. 07/10/20   [name], NP  hydrocortisone 2.5 % ointment Apply 1 application topically daily as needed (itching).    [provider]  Lactobacillus (PROBIOTIC ACIDOPHILUS PO) Take 1 tablet by mouth daily.    [provider]  levothyroxine (SYNTHROID) 100 MCG tablet TAKE 1 TABLET BY MOUTH ON TUES, WED, THURS, SAT AND SUN. TAKE 1& 1/2 TABLET ON MON AND FRI Patient taking differently: Take 100 mcg by mouth daily. 07/15/19   [name], NP  meclizine (ANTIVERT) 25 MG tablet TAKE 1 TABLET (25 MG TOTAL) BY MOUTH 3 (THREE) TIMES DAILY AS NEEDED FOR DIZZINESS. 06/13/18   Unk Pinto, MD  metroNIDAZOLE (  FLAGYL) 500 MG tablet Take 500 mg by mouth in the morning  and at bedtime.     [provider]  multivitamin (VIT W/EXTRA C) CHEW chewable tablet Chew 1 tablet by mouth daily.    [provider]  nystatin (MYCOSTATIN) 100000 UNIT/ML suspension Take 15 mLs by mouth daily as needed (tooth infections).  02/06/20   [provider]  olmesartan (BENICAR) 40 MG tablet Take 40 mg by mouth daily. Takes 1/2 to 1 tablet at bedtime for blood pressure.    [provider]  oxybutynin (DITROPAN) 5 MG tablet TAKE 1 TABLET TWICE A DAY FOR BLADDER CONTROL Patient taking differently: Take 5 mg by mouth 2 (two) times daily. 07/10/20   [name], NP  rOPINIRole (REQUIP) 2 MG tablet TAKE 1 TABLET FOUR TIMES A DAY FOR RESTLESS LEGS Patient taking differently: Take 2 mg by mouth in the morning, at noon, in the evening, and at bedtime. 04/14/20   Unk Pinto, MD  tiZANidine (ZANAFLEX) 2 MG tablet TAKE 1/2 TO 1 TABLET 3 TIMES A DAY AS NEEDED FOR MUSCLE SPASMS Patient taking differently: Take 2 mg by mouth daily as needed for muscle spasms. 09/11/20   Garnet Sierras, NP  traMADol (ULTRAM) 50 MG tablet Take 50 mg by mouth every 6 (six) hours as needed for moderate pain. 08/16/20   [provider]    Allergies    Atorvastatin, Codeine, Pravastatin, Statins, Amitriptyline, Carbidopa, Fish oil, Hydrocodone, Linseed oil, Nuvigil [armodafinil], Sulfa antibiotics, Erythromycin, Erythromycin base, Flax seed [bio-flax], Hydrocodone-acetaminophen, Morphine, Nsaids, Sertraline, Sinemet [carbidopa w-levodopa], Sulfamethoxazole, and Tolmetin  Review of Systems   Review of Systems  Constitutional: Negative for chills and fever.  HENT: Positive for congestion. Negative for rhinorrhea.   Eyes: Negative for redness and visual disturbance.  Respiratory: Positive for cough and shortness of breath (when going up stairs). Negative for wheezing.   Cardiovascular: Negative for chest pain and palpitations.  Gastrointestinal: Negative for abdominal  pain, nausea and vomiting.  Genitourinary: Negative for dysuria and urgency.  Musculoskeletal: Positive for arthralgias, back pain and myalgias.  Skin: Negative for pallor and wound.  Neurological: Negative for dizziness and headaches.    Physical Exam Updated Vital Signs BP 118/76   Pulse 67   Temp 98.1 F (36.7 C) (Oral)   Resp 16   SpO2 100%   Physical Exam Vitals and nursing note reviewed.  Constitutional:      General: She is not in acute distress.    Appearance: She is well-developed. She is not diaphoretic.  HENT:     Head: Normocephalic and atraumatic.  Eyes:     Pupils: Pupils are equal, round, and reactive to light.  Cardiovascular:     Rate and Rhythm: Normal rate and regular rhythm.     Heart sounds: No murmur heard. No friction rub. No gallop.   Pulmonary:     Effort: Pulmonary effort is normal.     Breath sounds: No wheezing or rales.  Abdominal:     General: There is no distension.     Palpations: Abdomen is soft.     Tenderness: There is no abdominal tenderness.  Musculoskeletal:        General: Tenderness present.     Cervical back: Normal range of motion and neck supple.     Comments: Tenderness with palpation of the right SI joint.  No midline step-off or deformities.  Pain along the greater trochanter on the right and along the right IT band.  Negative straight  leg raise test.  Pulse motor and sensation intact distally.  No clonus.  Reflexes are 2+ and equal.  Skin:    General: Skin is warm and dry.  Neurological:     Mental Status: She is alert and oriented to person, place, and time.  Psychiatric:        Behavior: Behavior normal.     ED Results / Procedures / Treatments   Labs (all labs ordered are listed, but only abnormal results are displayed) Labs Reviewed  CBC WITH DIFFERENTIAL/PLATELET  COMPREHENSIVE METABOLIC PANEL  TROPONIN I (HIGH SENSITIVITY)    EKG None  Radiology No results found.  Procedures Procedures    Medications Ordered in ED Medications  sodium chloride 0.9 % bolus 1,000 mL (1,000 mLs Intravenous New Bag/Given 09/26/20 1305)    ED Course  I have reviewed the triage vital signs and the nursing notes.  Pertinent labs & imaging results that were available during my care of the patient were reviewed by me and considered in my medical decision making (see chart for details).    MDM Rules/Calculators/A&P                          70 yo F with a chief complaints of orthostatic hypotension.  Sent by her doctor's office for laboratory evaluation.  I personally evaluated his note which recommended a CBC CMP and D-dimer.  They also recommended considering imaging of the soft tissue of the right leg.  Patient was just seen 2 weeks ago and had an elevated D-dimer.  At that time she had a CT scan of the chest that was negative.  I feel it is very unlikely that she has a pulmonary embolism based on her history also unlikely that she has a DVT with a nonswollen leg.  Will obtain the CBC and c-Met.  She has been having some shortness of breath on exertion we will obtain a troponin.  EKG.  Chest x-ray plain film of the low back and right hip.  Plain film of the low back and right hip viewed by me without fracture.  Chest x-ray viewed by me without focal infiltrate or pneumothorax.  Troponin is negative.  No significant anemia no significant electrolyte abnormality.  Will discharge the patient home.  PCP follow-up.  2:03 PM:  I have discussed the diagnosis/risks/treatment options with the patient and believe the pt to be eligible for discharge home to follow-up with PCP. We also discussed returning to the ED immediately if new or worsening sx occur. We discussed the sx which are most concerning (e.g., sudden worsening pain, fever, inability to tolerate by mouth) that necessitate immediate return. Medications administered to the patient during their visit and any new prescriptions provided to the patient are  listed below.  Medications given during this visit Medications  sodium chloride 0.9 % bolus 1,000 mL (1,000 mLs Intravenous New Bag/Given 09/26/20 1305)     The patient appears reasonably screen and/or stabilized for discharge and I doubt any other medical condition or other [facility] requiring further screening, evaluation, or treatment in the ED at this time prior to discharge.     Final Clinical Impression(s) / ED Diagnoses Final diagnoses:  SOB (shortness of breath)    Rx / DC Orders ED Discharge Orders    None       Deno Etienne, DO 09/26/20 1403

## 2020-09-28 ENCOUNTER — Telehealth: Payer: Self-pay | Admitting: *Deleted

## 2020-09-28 NOTE — Telephone Encounter (Signed)
Spoke with patient regarding BP medication instructions.Per Dr Melford Aase, the patient was advised to take Bisoprolol 10 mg every morning and Benicar 40 mg at night. Patient is aware and will call back if needed.

## 2020-10-02 ENCOUNTER — Other Ambulatory Visit: Payer: Self-pay | Admitting: Adult Health

## 2020-10-02 ENCOUNTER — Other Ambulatory Visit: Payer: Self-pay | Admitting: Internal Medicine

## 2020-10-02 DIAGNOSIS — Y831 Surgical operation with implant of artificial internal device as the cause of abnormal reaction of the patient, or of later complication, without mention of misadventure at the time of the procedure: Secondary | ICD-10-CM | POA: Diagnosis not present

## 2020-10-02 DIAGNOSIS — M25561 Pain in right knee: Secondary | ICD-10-CM | POA: Diagnosis not present

## 2020-10-02 DIAGNOSIS — T8484XD Pain due to internal orthopedic prosthetic devices, implants and grafts, subsequent encounter: Secondary | ICD-10-CM | POA: Diagnosis not present

## 2020-10-02 DIAGNOSIS — G8929 Other chronic pain: Secondary | ICD-10-CM | POA: Diagnosis not present

## 2020-10-02 DIAGNOSIS — Z96651 Presence of right artificial knee joint: Secondary | ICD-10-CM | POA: Diagnosis not present

## 2020-10-02 DIAGNOSIS — Z471 Aftercare following joint replacement surgery: Secondary | ICD-10-CM | POA: Diagnosis not present

## 2020-10-02 DIAGNOSIS — M25461 Effusion, right knee: Secondary | ICD-10-CM | POA: Diagnosis not present

## 2020-10-02 DIAGNOSIS — Z79899 Other long term (current) drug therapy: Secondary | ICD-10-CM | POA: Diagnosis not present

## 2020-10-02 DIAGNOSIS — Z96611 Presence of right artificial shoulder joint: Secondary | ICD-10-CM | POA: Diagnosis not present

## 2020-10-05 DIAGNOSIS — L438 Other lichen planus: Secondary | ICD-10-CM | POA: Diagnosis not present

## 2020-10-05 DIAGNOSIS — B379 Candidiasis, unspecified: Secondary | ICD-10-CM | POA: Diagnosis not present

## 2020-10-06 ENCOUNTER — Other Ambulatory Visit: Payer: Self-pay | Admitting: Adult Health

## 2020-10-08 ENCOUNTER — Encounter: Payer: Self-pay | Admitting: *Deleted

## 2020-10-09 ENCOUNTER — Other Ambulatory Visit: Payer: Self-pay | Admitting: Adult Health Nurse Practitioner

## 2020-10-09 DIAGNOSIS — M5416 Radiculopathy, lumbar region: Secondary | ICD-10-CM | POA: Diagnosis not present

## 2020-10-17 ENCOUNTER — Ambulatory Visit (INDEPENDENT_AMBULATORY_CARE_PROVIDER_SITE_OTHER): Payer: Medicare PPO | Admitting: Internal Medicine

## 2020-10-17 ENCOUNTER — Encounter: Payer: Self-pay | Admitting: Internal Medicine

## 2020-10-17 ENCOUNTER — Other Ambulatory Visit: Payer: Self-pay

## 2020-10-17 VITALS — BP 108/72 | HR 86 | Temp 97.3°F | Resp 16 | Ht 62.0 in | Wt 147.6 lb

## 2020-10-17 DIAGNOSIS — E1122 Type 2 diabetes mellitus with diabetic chronic kidney disease: Secondary | ICD-10-CM | POA: Diagnosis not present

## 2020-10-17 DIAGNOSIS — G894 Chronic pain syndrome: Secondary | ICD-10-CM

## 2020-10-17 DIAGNOSIS — E1169 Type 2 diabetes mellitus with other specified complication: Secondary | ICD-10-CM

## 2020-10-17 DIAGNOSIS — R0989 Other specified symptoms and signs involving the circulatory and respiratory systems: Secondary | ICD-10-CM | POA: Diagnosis not present

## 2020-10-17 DIAGNOSIS — E559 Vitamin D deficiency, unspecified: Secondary | ICD-10-CM | POA: Diagnosis not present

## 2020-10-17 DIAGNOSIS — E039 Hypothyroidism, unspecified: Secondary | ICD-10-CM

## 2020-10-17 DIAGNOSIS — N760 Acute vaginitis: Secondary | ICD-10-CM

## 2020-10-17 DIAGNOSIS — N182 Chronic kidney disease, stage 2 (mild): Secondary | ICD-10-CM

## 2020-10-17 DIAGNOSIS — Z79899 Other long term (current) drug therapy: Secondary | ICD-10-CM | POA: Diagnosis not present

## 2020-10-17 DIAGNOSIS — D513 Other dietary vitamin B12 deficiency anemia: Secondary | ICD-10-CM | POA: Diagnosis not present

## 2020-10-17 DIAGNOSIS — K5000 Crohn's disease of small intestine without complications: Secondary | ICD-10-CM

## 2020-10-17 DIAGNOSIS — I700 Atherosclerosis of aorta: Secondary | ICD-10-CM | POA: Diagnosis not present

## 2020-10-17 DIAGNOSIS — E785 Hyperlipidemia, unspecified: Secondary | ICD-10-CM | POA: Diagnosis not present

## 2020-10-17 DIAGNOSIS — R300 Dysuria: Secondary | ICD-10-CM

## 2020-10-17 MED ORDER — GABAPENTIN 600 MG PO TABS: ORAL_TABLET | ORAL | 1 refills | Status: DC

## 2020-10-17 MED ORDER — METRONIDAZOLE 0.75 % VA GEL: 1.0000 | Freq: Two times a day (BID) | VAGINAL | 2 refills | Status: DC

## 2020-10-17 NOTE — Patient Instructions (Signed)

## 2020-10-17 NOTE — Progress Notes (Signed)
Future Appointments  Date Time Provider [place]  10/17/2020   6 mo  11:30 AM Unk Pinto, MD [facility] None  04/08/2021     CPE  10:00 AM Unk Pinto, MD [facility] None  07/09/2021     M/C Wellness 11:15 AM [name], NP [facility] None    History of Present Illness:       [name]  presents for 6 month follow up with HTN, HLD, Pre-Diabetes and Vitamin D Deficiency.        Patient is treated for HTN & BP has been controlled at home. Today's BP  is at goal -  108/72. Patient has had no complaints of any cardiac type chest pain, palpitations, dyspnea / orthopnea / PND, dizziness, claudication, or dependent edema.       Hyperlipidemia is controlled with diet & meds. Patient denies myalgias or other med SE's. Last Lipids were not at goal:  Lab Results  Component Value Date   CHOL 187 07/04/2020   HDL 41 (L) 07/04/2020   LDLCALC 114 (H) 07/04/2020   TRIG 208 (H) 07/04/2020   CHOLHDL 4.6 07/04/2020     Also, the patient has history of PreDiabetes and has had no symptoms of reactive hypoglycemia, diabetic polys, paresthesias or visual blurring.  Last A1c was not at goal:  Lab Results  Component Value Date   HGBA1C 5.9 (H) 07/04/2020            Further, the patient also has history of Vitamin D Deficiency and supplements vitamin D without any suspected side-effects. Last vitamin D was at goal:  Lab Results  Component Value Date   VD25OH 60 03/20/2020    Current Outpatient Medications on File Prior to Visit  Medication Sig  . Adalimumab 40 MG/0.4ML PNKT Inject 40 mg into the skin every 14 (fourteen) days.   [name] ascorbic acid (VITAMIN C) 100 MG tablet Take 100 mg by mouth daily. "Gummy"  . benzonatate (TESSALON) 200 MG capsule Take 1 cap three times daily as needed for cough. (Patient taking differently: Take 200 mg by mouth 3 (three) times daily as needed for cough.)  . bisoprolol (ZEBETA) 10 MG tablet Take    1 tablet    Daily        for BP (Patient taking differently: Take 10 mg by mouth daily.)  . buPROPion (WELLBUTRIN XL) 150 MG 24 hr tablet TAKE 1 TABLET DAILY FOR MOOD, FOCUS & CONCENTRATION  . calcium carbonate (OS-CAL) 1250 (500 Ca) MG chewable tablet Chew 1 tablet by mouth daily.  . carboxymethylcellulose (REFRESH PLUS) 0.5 % SOLN Place 1 drop into both eyes 3 (three) times daily as needed (dry eyes).  . Cholecalciferol 125 MCG (5000 UT) TABS Take 5,000 Units by mouth daily.  . clotrimazole (MYCELEX) 10 MG troche Take 1 tablet by mouth daily.  . Cyanocobalamin (B-12 SL) Place 1 each under the tongue daily.   . diclofenac Sodium (VOLTAREN) 1 % GEL Apply 4 g topically 4 (four) times daily. (Patient taking differently: Apply 4 g topically 4 (four) times daily as needed (pain).)  . doxycycline (VIBRAMYCIN) 100 MG capsule Take  1 capsule  2 x /day  with Meals for Infection  . DULoxetine  60 MG capsule Take 1 capsule Daily for Chronic Pain  . famotidine (PEPCID) 40 MG tablet Take 40 mg by mouth 2 (two) times daily.  . fenofibrate  134 MG capsule Take  1 capsule  Daily   .  fluconazole (DIFLUCAN) 150 MG tablet Take 150 mg Sundays and Thursday  . fluocinonide gel (LIDEX) 6.44 % Apply 1 application topically 2 (two) times daily as needed (infection).  Asencion Islam  nasal spray USE TWO SPRAYS IN EACH NOSTRIL DAILY AS NEEDED (Patient taking differently: Place 2 sprays into both nostrils daily as needed for allergies.)  . gabapentin 300 MG capsule Taking  3 imes daily  . hydrocortisone 2.5 % ointment Apply 1 application topically daily as needed (itching).  . Lactobacillus  ACIDOPHILUS  Take 1 tablet by mouth daily.  [name] levothyroxine  100 MCG tablet TAKE 1 TABLET BY MOUTH ON TUES, WED, THURS, SAT AND SUN. TAKE 1& 1/2 TABLET ON MON AND FRI  . meclizine 25 MG tablet TAKE 1 TABLET  3 x/day  AS NEEDED   . metroNIDAZOLE  500 MG tablet Take in the morning and at bedtime.   . Multivitamin w/extra Vit C  Chew 1 tablet  daily.  [name]  nystatin (Mycostatin) 10,0000 U/ML susp Take 15 mLs  daily as needed (tooth infections).   . olmesartan ) 40 MG tablet Takes 1/2 to 1 tablet at bedtime for blood pressure.  [name] oxybutynin  5 MG tablet TAKE 1 TABLET  2 x/day   . rOPINIRole  2 MG tablet TAKE 1 TABLET 4 x /day   . tiZANidine  2 MG tablet TAKE 1/2 -1 TAB 3 x /day  AS NEEDED   . traMADol  50 MG tablet Take  every 6  hours as needed     Allergies  Allergen Reactions  . Atorvastatin Other (See Comments)    Extreme pain  . Codeine Other (See Comments)    Headache  . Pravastatin Other (See Comments)    Exreme pain  . Statins Other (See Comments)    Extreme pain  . Amitriptyline Other (See Comments)    Unspecified  . Carbidopa   . Fish Oil Nausea Only  . Hydrocodone Other (See Comments)    Itching Other Reaction: Other reaction   . Linseed Oil Other (See Comments)    Other Reaction: Other reaction  . Nuvigil [Armodafinil] Other (See Comments)    Unspecified  . Sulfa Antibiotics Other (See Comments)  . Erythromycin Other (See Comments)    Reaction unspecified  . Erythromycin Base Rash  . Flax Seed [Bio-Flax] Rash    Linseed  . Hydrocodone-Acetaminophen Rash  . Morphine Nausea And Vomiting and Rash  . Nsaids Nausea Only and Other (See Comments)    Other Reaction: Other reaction  . Sertraline Rash  . Sinemet [Carbidopa W-Levodopa] Rash  . Sulfamethoxazole Rash  . Tolmetin Nausea Only    PMHx:   Past Medical History:  Diagnosis Date  . Arthritis   . ASCVD (arteriosclerotic cardiovascular disease)   . Closed nondisplaced subtrochanteric fracture of left femur ([place]) 09/03/2017   September 2018.  Status post surgery by Dr. Marcelino Scot  . Depression   . Fibromyalgia   . Gait disorder 10/26/2012  . GERD (gastroesophageal reflux disease)   . GI bleed   . Hyperlipidemia   . Hypertension   . Hypothyroid   . Periprosthetic supracondylar fracture of femur, left  03/23/2017  . Restless leg syndrome   . Rhinitis 06/25/2010   . Sleep apnea   . TIA (transient ischemic attack)    3        . Vitamin D deficiency     Immunization History  Administered Date(s) Administered  . DT (Pediatric) 01/03/2015  . Influenza Split  03/31/2011, 05/14/2014, 02/27/2016, 04/16/2018  . Influenza Whole 03/30/2010  . Influenza, High Dose Seasonal PF 03/13/2015, 07/21/2019, 02/16/2020  . Influenza-Unspecified 03/31/2011, 05/14/2014, 02/27/2016  . PFIZER Comirnaty(Gray Top)Covid-19 Tri-Sucrose Vaccine 07/13/2020  . PFIZER(Purple Top)SARS-COV-2 Vaccination 09/19/2019, 10/10/2019  . Pneumococcal Conjugate-13 06/30/2005, 04/17/2015, 01/23/2019  . Pneumococcal Polysaccharide-23 01/06/2017  . Pneumococcal-Unspecified 06/30/2005  . Td 06/30/2004  . Td,absorbed, Preservative Free, Adult Use, Lf Unspecified 06/30/2004  . Zoster 07/01/2007    Past Surgical History:  Procedure Laterality Date  . ABDOMINAL HYSTERECTOMY  1992  . ANTERIOR CERVICAL DECOMPRESSION/DISCECTOMY FUSION 4 LEVELS N/A 06/14/2015   Procedure: Cervical three-four Cervical four-five Cervical five- six Cervical six- seven  Anterior cervical decompression/diskectomy/fusion;  Surgeon: Erline Levine, MD;  Location: [facility];  Service: Neurosurgery;  Laterality: N/A;  C3-4 C4-5 C5-6 C6-7 Anterior cervical decompression/diskectomy/fusion  . APPENDECTOMY  1960  . [place]  . JOINT REPLACEMENT     2   LEFT  1 RIGHT   . KNEE SURGERY    . ORIF FEMUR FRACTURE Left 03/24/2017   Procedure: OPEN REDUCTION INTERNAL FIXATION (ORIF) FEMUR FRACTURE;  Surgeon: Altamese Bogota, MD;  Location: [facility];  Service: Orthopedics;  Laterality: Left;  . ROTATOR CUFF REPAIR Left 2005   2 LEFT  1  RIGHT  . SHOULDER ADHESION RELEASE    . SPINE SURGERY    . TOE SURGERY     LEFT       . TOE SURGERY     RIGHT  BIG TOE  . TONSILLECTOMY  1960    FHx:    Reviewed / unchanged  SHx:    Reviewed / unchanged   Systems Review:  Constitutional: Denies fever, chills, wt  changes, headaches, insomnia, fatigue, night sweats, change in appetite. Eyes: Denies redness, blurred vision, diplopia, discharge, itchy, watery eyes.  ENT: Denies discharge, congestion, post nasal drip, epistaxis, sore throat, earache, hearing loss, dental pain, tinnitus, vertigo, sinus pain, snoring.  CV: Denies chest pain, palpitations, irregular heartbeat, syncope, dyspnea, diaphoresis, orthopnea, PND, claudication or edema. Respiratory: denies cough, dyspnea, DOE, pleurisy, hoarseness, laryngitis, wheezing.  Gastrointestinal: Denies dysphagia, odynophagia, heartburn, reflux, water brash, abdominal pain or cramps, nausea, vomiting, bloating, diarrhea, constipation, hematemesis, melena, hematochezia  or hemorrhoids. Genitourinary: Denies dysuria, frequency, urgency, nocturia, hesitancy, discharge, hematuria or flank pain. Musculoskeletal: Denies arthralgias, myalgias, stiffness, jt. swelling, pain, limping or strain/sprain.  Skin: Denies pruritus, rash, hives, warts, acne, eczema or change in skin lesion(s). Neuro: No weakness, tremor, incoordination, spasms, paresthesia or pain. Psychiatric: Denies confusion, memory loss or sensory loss. Endo: Denies change in weight, skin or hair change.  Heme/Lymph: No excessive bleeding, bruising or enlarged lymph nodes.  Physical Exam  BP 108/72   Pulse 86   Temp (!) 97.3 F (36.3 C)   Resp 16   Ht 5' 2"  (1.575 m)   Wt 147 lb 9.6 oz (67 kg)   SpO2 93%   BMI 27.00 kg/m   Appears  well nourished, well groomed  and in no distress.  Eyes: PERRLA, EOMs, conjunctiva no swelling or erythema. Sinuses: No frontal/maxillary tenderness ENT/Mouth: EAC's clear, TM's nl w/o erythema, bulging. Nares clear w/o erythema, swelling, exudates. Oropharynx clear without erythema or exudates. Oral hygiene is good. Tongue normal, non obstructing. Hearing intact.  Neck: Supple. Thyroid not palpable. Car 2+/2+ without bruits, nodes or JVD. Chest: Respirations nl with  BS clear & equal w/o rales, rhonchi, wheezing or stridor.  Cor: Heart sounds normal w/ regular rate and rhythm without  sig. murmurs, gallops, clicks or rubs. Peripheral pulses normal and equal  without edema.  Abdomen: Soft & bowel sounds normal. Non-tender w/o guarding, rebound, hernias, masses or organomegaly.  Lymphatics: Unremarkable.  Musculoskeletal: Full ROM all peripheral extremities, joint stability, 5/5 strength and normal gait.  Skin: Warm, dry without exposed rashes, lesions or ecchymosis apparent.  Neuro: Cranial nerves intact, reflexes equal bilaterally. Sensory-motor testing grossly intact. Tendon reflexes grossly intact.  Pysch: Alert & oriented x 3.  Insight and judgement nl & appropriate. No ideations.  Assessment and Plan:  1. Labile hypertension  - Continue medication, monitor blood pressure at home.  - Continue DASH diet.  Reminder to go to the ER if any CP,  SOB, nausea, dizziness, severe HA, changes vision/speech.  - Magnesium - TSH  2. Hyperlipidemia associated with type 2 diabetes mellitus ([place])  - Continue diet/meds, exercise,& lifestyle modifications.  - Continue monitor periodic cholesterol/liver & renal functions   - Lipid panel - TSH  3. Type 2 diabetes mellitus with stage 2 chronic kidney  disease, without long-term current use of insulin (H[facility])  - Continue diet, exercise  - Lifestyle modifications.  - Monitor appropriate labs.  - Hemoglobin A1c - Insulin, random  4. Hypothyroidism, unspecified type  - TSH  5. Vitamin D deficiency  - Continue supplementation.   6. Other dietary vitamin B12 deficiency anemia  - Vitamin B12  7. Aortic atherosclerosis ([place]) by Chest CT scan on 09/2017  - Lipid panel  8. Crohn's disease of small intestine without complication ([place])   9. Medication management  - Magnesium - Lipid panel - TSH - Hemoglobin A1c - Insulin, random - Vitamin B12  10. Dysuria  - Urinalysis, Routine w reflex  microscopic - Urine Culture  11. Acute vaginitis  - metroNIDAZOLE (METROGEL) 0.75 % vaginal gel;  Place 1 Applicatorful vaginally 2 (two) times daily.  Use I vaginal application 2 x /day   Disp: 70 g; Refill: 2  12. Chronic pain syndrome  - gabapentin  600 MG tablet;  Take 1/2 to 1 tablet 3 to 4 x /Daily as needed for Pain   Disp: 360 tablet; Refill: 1        Discussed  regular exercise, BP monitoring, weight control to achieve/maintain BMI less than 25 and discussed med and SE's. Recommended labs to assess and monitor clinical status with further disposition pending results of labs.  I discussed the assessment and treatment plan with the patient. The patient was provided an opportunity to ask questions and all were answered. The patient agreed with the plan and demonstrated an understanding of the instructions.  I provided over 30 minutes of exam, counseling, chart review and  complex critical decision making.        The patient was advised to call back or seek an in-person evaluation if the symptoms worsen or if the condition fails to improve as anticipated.   Kirtland Bouchard, MD

## 2020-10-18 DIAGNOSIS — R300 Dysuria: Secondary | ICD-10-CM | POA: Diagnosis not present

## 2020-10-18 LAB — HEMOGLOBIN A1C
Hgb A1c MFr Bld: 6 % of total Hgb — ABNORMAL HIGH (ref <5.7)
Mean Plasma Glucose: 126 mg/dL
eAG (mmol/L): 7 mmol/L

## 2020-10-18 LAB — TSH: TSH: 1.76 mIU/L (ref 0.40–4.50)

## 2020-10-18 LAB — LIPID PANEL
Cholesterol: 165 mg/dL (ref <200)
HDL: 35 mg/dL — ABNORMAL LOW (ref >=50)
LDL Cholesterol (Calc): 94 mg/dL (calc)
Non-HDL Cholesterol (Calc): 130 mg/dL (calc) — ABNORMAL HIGH (ref <130)
Total CHOL/HDL Ratio: 4.7 (calc) (ref <5.0)
Triglycerides: 238 mg/dL — ABNORMAL HIGH (ref <150)

## 2020-10-18 LAB — INSULIN, RANDOM: Insulin: 6.3 u[IU]/mL

## 2020-10-18 LAB — VITAMIN B12: Vitamin B-12: >2000 pg/mL — ABNORMAL HIGH (ref 200–1100)

## 2020-10-18 LAB — MAGNESIUM: Magnesium: 2.3 mg/dL (ref 1.5–2.5)

## 2020-10-19 LAB — URINE CULTURE
MICRO NUMBER:: 11798934
Result:: NO GROWTH
SPECIMEN QUALITY:: ADEQUATE

## 2020-10-19 LAB — URINALYSIS, ROUTINE W REFLEX MICROSCOPIC
Bilirubin Urine: NEGATIVE
Glucose, UA: NEGATIVE
Hgb urine dipstick: NEGATIVE
Ketones, ur: NEGATIVE
Leukocytes,Ua: NEGATIVE
Nitrite: NEGATIVE
Protein, ur: NEGATIVE
Specific Gravity, Urine: 1.025 (ref 1.001–1.035)
pH: <=5 (ref 5.0–8.0)

## 2020-10-19 NOTE — Progress Notes (Signed)
============================================================ - Test results slightly outside the reference range are not unusual. If there is anything important, I will review this with you,  otherwise it is considered normal test values.  If you have further questions,  please do not hesitate to contact me at the office or via My Chart.  ============================================================ ============================================================  -  Total Chol = 165 - and LDL Chol = 94  - Both  Excellent   -  Very low risk for Heart Attack  / Stroke ========================================================  - But, Triglycerides (   238  ) or fats in blood are too high                        ( Ideal or Goal is less than 150)    - Recommend avoid fried & greasy foods,  sweets / candy,   - Avoid white rice  (brown or wild rice or Quinoa is OK),   - Avoid white potatoes  (sweet potatoes are OK)   - Avoid anything made from white flour  - bagels, doughnuts, rolls, buns, biscuits, white and   wheat breads, pizza crust and traditional  pasta made of white flour & egg white  - (vegetarian pasta or spinach or wheat pasta is OK).    - Multi-grain bread is OK - like multi-grain flat bread or  sandwich thins.   - Avoid alcohol in excess.   - Exercise is also important. ============================================================ ============================================================  -  A1c - sl higher - back up to 6.0% - when it gets to 6.5%                                                  - you will be considered fully a Diabetic                                  & when it gets to 7.0%, you get to start Diabetic Medicines   - So Please work harder on better diet & weight loss    Being diabetic has a  300% increased risk for heart attack,  stroke, cancer, and alzheimer- type vascular dementia.    -  Avoidall foods that are white except chicken,                                                                         fish & calliflower.  - Avoid white rice  (brown & wild rice is OK),   - Avoid white potatoes  (sweet potatoes in moderation is OK),   White bread or wheat bread or anything made out of   white flour like bagels, donuts, rolls, buns, biscuits, cakes,  - pastries, cookies, pizza crust, and pasta (made from  white flour & egg whites)   - vegetarian pasta or spinach or wheat pasta is OK.  - Multigrain breads like Arnold's, Pepperidge Farm or   multigrain sandwich thins or high fiber breads like   [place] bread or "Dave's Killer" breads that  are  4 to 5 grams fiber per slice !  are best.     Diet, exercise and weight loss can reverse and cure  diabetes in the early stages.     - Diet, exercise and weight loss is very important in the   control and prevention of complications of diabetes which  affects every system in your body, ie.   -Brain - dementia/stroke,  - eyes - glaucoma/blindness,  - heart - heart attack/heart failure,  - kidneys - dialysis,  - stomach - gastric paralysis,  - intestines - malabsorption,  - nerves - severe painful neuritis,  - circulation - gangrene & loss of a leg(s)  - and finally  . . . . . . . . . . . . . . . . . .    - cancer and Alzheimers. ============================================================ ============================================================  -  Vitamin B12 levels are extremely high  which means                         you have a supply or 6 month reserve of B12 in your Liver  - So Please     Decrease      your Vitamin B12 tabs to Only 2 tablets / week ============================================================ ============================================================  -  Urine is still pending   ============================================================ ============================================================

## 2020-10-20 NOTE — Progress Notes (Signed)
============================================================ ============================================================  -    Urine culture finally returned with "No Growth" or "No Infection"  ============================================================ ============================================================

## 2020-10-29 DIAGNOSIS — R7303 Prediabetes: Secondary | ICD-10-CM | POA: Insufficient documentation

## 2020-10-30 DIAGNOSIS — Z01818 Encounter for other preprocedural examination: Secondary | ICD-10-CM | POA: Diagnosis not present

## 2020-10-30 DIAGNOSIS — L438 Other lichen planus: Secondary | ICD-10-CM | POA: Insufficient documentation

## 2020-10-30 DIAGNOSIS — Z8673 Personal history of transient ischemic attack (TIA), and cerebral infarction without residual deficits: Secondary | ICD-10-CM | POA: Diagnosis not present

## 2020-10-30 DIAGNOSIS — E119 Type 2 diabetes mellitus without complications: Secondary | ICD-10-CM | POA: Diagnosis not present

## 2020-10-30 DIAGNOSIS — M4712 Other spondylosis with myelopathy, cervical region: Secondary | ICD-10-CM | POA: Diagnosis not present

## 2020-10-30 DIAGNOSIS — Z96651 Presence of right artificial knee joint: Secondary | ICD-10-CM | POA: Diagnosis not present

## 2020-10-30 DIAGNOSIS — I10 Essential (primary) hypertension: Secondary | ICD-10-CM | POA: Diagnosis not present

## 2020-10-30 DIAGNOSIS — Z471 Aftercare following joint replacement surgery: Secondary | ICD-10-CM | POA: Diagnosis not present

## 2020-10-30 DIAGNOSIS — B3783 Candidal cheilitis: Secondary | ICD-10-CM | POA: Insufficient documentation

## 2020-10-30 DIAGNOSIS — K50919 Crohn's disease, unspecified, with unspecified complications: Secondary | ICD-10-CM | POA: Diagnosis not present

## 2020-10-30 DIAGNOSIS — R7303 Prediabetes: Secondary | ICD-10-CM | POA: Diagnosis not present

## 2020-10-30 DIAGNOSIS — G4730 Sleep apnea, unspecified: Secondary | ICD-10-CM | POA: Diagnosis not present

## 2020-10-30 DIAGNOSIS — K219 Gastro-esophageal reflux disease without esophagitis: Secondary | ICD-10-CM | POA: Diagnosis not present

## 2020-11-03 ENCOUNTER — Other Ambulatory Visit: Payer: Self-pay | Admitting: Internal Medicine

## 2020-11-03 MED ORDER — TIZANIDINE HCL 2 MG PO TABS: 2.0000 mg | ORAL_TABLET | Freq: Three times a day (TID) | ORAL | 0 refills | Status: DC | PRN

## 2020-11-08 DIAGNOSIS — M19042 Primary osteoarthritis, left hand: Secondary | ICD-10-CM | POA: Diagnosis not present

## 2020-11-08 DIAGNOSIS — M1812 Unilateral primary osteoarthritis of first carpometacarpal joint, left hand: Secondary | ICD-10-CM | POA: Diagnosis not present

## 2020-11-08 DIAGNOSIS — M180 Bilateral primary osteoarthritis of first carpometacarpal joints: Secondary | ICD-10-CM | POA: Diagnosis not present

## 2020-11-08 DIAGNOSIS — G5601 Carpal tunnel syndrome, right upper limb: Secondary | ICD-10-CM | POA: Diagnosis not present

## 2020-11-15 DIAGNOSIS — M797 Fibromyalgia: Secondary | ICD-10-CM | POA: Diagnosis not present

## 2020-11-15 DIAGNOSIS — M25561 Pain in right knee: Secondary | ICD-10-CM | POA: Diagnosis not present

## 2020-11-15 DIAGNOSIS — M25511 Pain in right shoulder: Secondary | ICD-10-CM | POA: Diagnosis not present

## 2020-11-15 DIAGNOSIS — M47812 Spondylosis without myelopathy or radiculopathy, cervical region: Secondary | ICD-10-CM | POA: Diagnosis not present

## 2020-11-21 ENCOUNTER — Other Ambulatory Visit: Payer: Self-pay | Admitting: Internal Medicine

## 2020-12-03 ENCOUNTER — Other Ambulatory Visit: Payer: Self-pay | Admitting: Adult Health

## 2020-12-03 DIAGNOSIS — M797 Fibromyalgia: Secondary | ICD-10-CM

## 2020-12-03 MED ORDER — DULOXETINE HCL 60 MG PO CPEP: ORAL_CAPSULE | ORAL | 3 refills | Status: DC

## 2020-12-05 DIAGNOSIS — E1169 Type 2 diabetes mellitus with other specified complication: Secondary | ICD-10-CM | POA: Diagnosis not present

## 2020-12-05 DIAGNOSIS — E119 Type 2 diabetes mellitus without complications: Secondary | ICD-10-CM | POA: Diagnosis not present

## 2020-12-05 DIAGNOSIS — Z01818 Encounter for other preprocedural examination: Secondary | ICD-10-CM | POA: Diagnosis not present

## 2020-12-05 DIAGNOSIS — I10 Essential (primary) hypertension: Secondary | ICD-10-CM | POA: Diagnosis not present

## 2020-12-05 DIAGNOSIS — R7303 Prediabetes: Secondary | ICD-10-CM | POA: Diagnosis not present

## 2020-12-05 DIAGNOSIS — J479 Bronchiectasis, uncomplicated: Secondary | ICD-10-CM | POA: Diagnosis not present

## 2020-12-05 DIAGNOSIS — G4730 Sleep apnea, unspecified: Secondary | ICD-10-CM | POA: Diagnosis not present

## 2020-12-05 DIAGNOSIS — E039 Hypothyroidism, unspecified: Secondary | ICD-10-CM | POA: Diagnosis not present

## 2020-12-05 DIAGNOSIS — I700 Atherosclerosis of aorta: Secondary | ICD-10-CM | POA: Diagnosis not present

## 2020-12-07 DIAGNOSIS — B379 Candidiasis, unspecified: Secondary | ICD-10-CM | POA: Diagnosis not present

## 2020-12-07 DIAGNOSIS — L219 Seborrheic dermatitis, unspecified: Secondary | ICD-10-CM | POA: Diagnosis not present

## 2020-12-07 DIAGNOSIS — B370 Candidal stomatitis: Secondary | ICD-10-CM | POA: Diagnosis not present

## 2020-12-07 DIAGNOSIS — L439 Lichen planus, unspecified: Secondary | ICD-10-CM | POA: Diagnosis not present

## 2020-12-10 DIAGNOSIS — G5601 Carpal tunnel syndrome, right upper limb: Secondary | ICD-10-CM | POA: Diagnosis not present

## 2020-12-10 DIAGNOSIS — M180 Bilateral primary osteoarthritis of first carpometacarpal joints: Secondary | ICD-10-CM | POA: Diagnosis not present

## 2020-12-10 DIAGNOSIS — M19049 Primary osteoarthritis, unspecified hand: Secondary | ICD-10-CM | POA: Diagnosis not present

## 2020-12-11 DIAGNOSIS — Z96611 Presence of right artificial shoulder joint: Secondary | ICD-10-CM | POA: Diagnosis not present

## 2020-12-11 DIAGNOSIS — Z471 Aftercare following joint replacement surgery: Secondary | ICD-10-CM | POA: Diagnosis not present

## 2020-12-14 ENCOUNTER — Encounter: Payer: Self-pay | Admitting: Internal Medicine

## 2020-12-14 ENCOUNTER — Ambulatory Visit (INDEPENDENT_AMBULATORY_CARE_PROVIDER_SITE_OTHER): Payer: Medicare PPO | Admitting: Internal Medicine

## 2020-12-14 ENCOUNTER — Other Ambulatory Visit: Payer: Self-pay

## 2020-12-14 VITALS — BP 106/60 | HR 75 | Temp 97.7°F | Resp 16 | Ht 63.0 in | Wt 146.6 lb

## 2020-12-14 DIAGNOSIS — H6092 Unspecified otitis externa, left ear: Secondary | ICD-10-CM

## 2020-12-14 DIAGNOSIS — M26623 Arthralgia of bilateral temporomandibular joint: Secondary | ICD-10-CM | POA: Diagnosis not present

## 2020-12-14 DIAGNOSIS — H6693 Otitis media, unspecified, bilateral: Secondary | ICD-10-CM

## 2020-12-14 MED ORDER — NEOMYCIN-POLYMYXIN-HC 3.5-10000-1 OT SUSP: OTIC | 1 refills | Status: DC

## 2020-12-14 MED ORDER — DEXAMETHASONE 4 MG PO TABS: ORAL_TABLET | ORAL | 0 refills | Status: DC

## 2020-12-14 NOTE — Progress Notes (Signed)
Future Appointments  Date Time Provider [place]  12/14/2020 11:30 AM Unk Pinto, MD [facility] None  04/08/2021 10:00 AM Unk Pinto, MD [facility] None  07/09/2021 11:00 AM [name], NP [facility] None    History of Present Illness:      [name]   with HTN, HLD, Pre-Diabetes and Vitamin D Deficiency who presents with ear  discomfort for 2 days. Also has a roaring sensation in her ears.    Medications  Current Outpatient Medications (Endocrine & Metabolic):    levothyroxine (SYNTHROID) 100 MCG tablet, TAKE 1 TABLET BY MOUTH ON TUES, WED, THURS, SAT AND SUN. TAKE 1& 1/2 TABLET ON MON AND FRI  Current Outpatient Medications (Cardiovascular):    bisoprolol (ZEBETA) 10 MG tablet, Take    1 tablet    Daily       for BP (Patient taking differently: Take 10 mg by mouth daily.)   fenofibrate micronized (LOFIBRA) 134 MG capsule, Take  1 capsule  Daily  for Triglycerides (Blood Fats)   olmesartan (BENICAR) 40 MG tablet, Take 40 mg by mouth daily. Takes 1/2 to 1 tablet at bedtime for blood pressure.  Current Outpatient Medications (Respiratory):    benzonatate (TESSALON) 200 MG capsule, Take 1 cap three times daily as needed for cough. (Patient taking differently: Take 200 mg by mouth 3 (three) times daily as needed for cough.)   fluticasone (FLONASE) 50 MCG/ACT nasal spray, USE TWO SPRAYS IN EACH NOSTRIL DAILY AS NEEDED (Patient taking differently: Place 2 sprays into both nostrils daily as needed for allergies.)  Current Outpatient Medications (Analgesics):    Adalimumab 40 MG/0.4ML PNKT, Inject 40 mg into the skin every 14 (fourteen) days.    buprenorphine (BUTRANS) 5 MCG/HR PTWK, Place 1 patch onto the skin once a week. (Patient not taking: Reported on 10/17/2020)   traMADol (ULTRAM) 50 MG tablet, Take 50 mg by mouth every 6 (six) hours as needed for moderate pain.  Current Outpatient Medications (Hematological):    Cyanocobalamin (B-12 SL),  Place 1 each under the tongue daily.   Current Outpatient Medications (Other):    ascorbic acid (VITAMIN C) 100 MG tablet, Take 100 mg by mouth daily. "Gummy"   buPROPion (WELLBUTRIN XL) 150 MG 24 hr tablet, TAKE 1 TABLET DAILY FOR MOOD, FOCUS & CONCENTRATION   calcium carbonate (OS-CAL) 1250 (500 Ca) MG chewable tablet, Chew 1 tablet by mouth daily.   carboxymethylcellulose (REFRESH PLUS) 0.5 % SOLN, Place 1 drop into both eyes 3 (three) times daily as needed (dry eyes).   Cholecalciferol 125 MCG (5000 UT) TABS, Take 5,000 Units by mouth daily.   clotrimazole (MYCELEX) 10 MG troche, Take 1 tablet by mouth daily.   diclofenac Sodium (VOLTAREN) 1 % GEL, Apply 4 g topically 4 (four) times daily. (Patient taking differently: Apply 4 g topically 4 (four) times daily as needed (pain).)   doxycycline (VIBRAMYCIN) 100 MG capsule, Take  1 capsule  2 x /day  with Meals for Infection   DULoxetine (CYMBALTA) 60 MG capsule, Take 1 capsule Daily for Chronic Pain   famotidine (PEPCID) 40 MG tablet, Take 40 mg by mouth 2 (two) times daily.   fluconazole (DIFLUCAN) 150 MG tablet, Take 150 mg by mouth See admin instructions. Sundays and Thursday   fluocinonide gel (LIDEX) 1.19 %, Apply 1 application topically 2 (two) times daily as needed (infection).   gabapentin (NEURONTIN) 600 MG tablet, Take 1/2 to 1 tablet 3 to 4 x /Daily as  needed for Pain   hydrocortisone 2.5 % ointment, Apply 1 application topically daily as needed (itching).   Lactobacillus (PROBIOTIC ACIDOPHILUS PO), Take 1 tablet by mouth daily.   meclizine (ANTIVERT) 25 MG tablet, TAKE 1 TABLET (25 MG TOTAL) BY MOUTH 3 (THREE) TIMES DAILY AS NEEDED FOR DIZZINESS.   metroNIDAZOLE (FLAGYL) 500 MG tablet, Take 500 mg by mouth in the morning and at bedtime.    metroNIDAZOLE (METROGEL) 0.75 % vaginal gel, Place 1 Applicatorful vaginally 2 (two) times daily. Use I vaginal application 2 x /day   multivitamin (VIT W/EXTRA C) CHEW chewable tablet, Chew 1  tablet by mouth daily.   oxybutynin (DITROPAN) 5 MG tablet, TAKE 1 TABLET TWICE A DAY FOR BLADDER CONTROL (Patient taking differently: Take 5 mg by mouth 2 (two) times daily.)   rOPINIRole (REQUIP) 2 MG tablet, TAKE 1 TABLET FOUR TIMES A DAY FOR RESTLESS LEGS (Patient taking differently: Take 2 mg by mouth in the morning, at noon, in the evening, and at bedtime.)   tiZANidine (ZANAFLEX) 2 MG tablet, Take 1 tablet (2 mg total) by mouth every 8 (eight) hours as needed for muscle spasms.  Problem list She has Obstructive sleep apnea; RESTLESS LEG SYNDROME; Essential hypertension; Rhinitis; GERD; Crohn's Enterocolitis / Regional enteritis; Fibromyalgia; Gait disorder; Hyperlipidemia associated with type 2 diabetes mellitus ([place]); Hypothyroid; Depression, major, recurrent, in partial remission ([place]); Vitamin D deficiency; ASCVD; Medication management; Rotator cuff arthropathy; Obesity (BMI 30.0-34.9); Spinal stenosis in cervical region; IBS (irritable colon syndrome); Sleep talking; B12 deficiency anemia; Polypharmacy; Bronchiectasis without complication ([place]); Arthritis of carpometacarpal (CMC) joints of both thumbs; Bilateral bunions; Cervical disc disease; Constipation; Crohn's ileitis ([place]); Rotator cuff tear; Diverticulosis; History of adenomatous polyp of colon; Internal hemorrhoids; Knee pain; Other specified arthropathy involving shoulder region; S/P revision of total knee, left; Diet-controlled type 2 diabetes mellitus ([place]); Rupture of popliteal cyst of right knee region (complex cyst); History of recurrent TIAs; COVID-19 virus infection; CKD stage 2 due to type 2 diabetes mellitus ([place]); History of total knee arthroplasty, right; Pain due to total right knee replacement ([place]); Presence of artificial knee joint, right; and Aortic atherosclerosis ([place]) by Chest CT scan on 09/2017 on their problem list.   Observations/Objective:  BP 106/60   Pulse 75   Temp 97.7 F (36.5 C)   Resp 16   Ht 5' 3"  (1.6  m)   Wt 146 lb 9.6 oz (66.5 kg)   SpO2 98%   BMI 25.97 kg/m   HEENT - EACs patent. Rt canal clear and Left canal appears inflamed.                  - Bilat TMs retracted. Bilat TM jt tenderness.  Neck - supple.  Chest - Clear equal BS. Cor - Nl HS. RRR w/o sig MGR. PP 1(+). No edema. MS- FROM w/o deformities.  Gait Nl. Neuro -  Nl w/o focal abnormalities.  Assessment and Plan:   1. Otitis externa of left ear, unspecified chronicity, unspecified type  - neomycin-polymyxin-hydrocortisone (CORTISPORIN) 3.5-10000-1 OTIC suspension; Instill  6 to 8 drops  to affected ear  3 to 4 x /day  and occlude with cotton  Dispense: 10 mL; Refill: 1   2. Bilateral otitis media  - Recommended OTC decongestants   3. Bilateral temporomandibular joint pain  - dexamethasone  4 MG tablet; Take 1 tab 3 x day - 3 days, then 2 x day - 3 days, then 1 tab daily    Follow Up  Instructions:      I discussed the assessment and treatment plan with the patient. The patient was provided an opportunity to ask questions and all were answered. The patient agreed with the plan and demonstrated an understanding of the instructions.       The patient was advised to call back or seek an in-person evaluation if the symptoms worsen or if the condition fails to improve as anticipated.   Kirtland Bouchard, MD

## 2020-12-15 ENCOUNTER — Encounter: Payer: Self-pay | Admitting: Internal Medicine

## 2020-12-19 DIAGNOSIS — Z20822 Contact with and (suspected) exposure to covid-19: Secondary | ICD-10-CM | POA: Diagnosis not present

## 2020-12-20 DIAGNOSIS — Z20822 Contact with and (suspected) exposure to covid-19: Secondary | ICD-10-CM | POA: Diagnosis not present

## 2020-12-20 DIAGNOSIS — I69891 Dysphagia following other cerebrovascular disease: Secondary | ICD-10-CM | POA: Diagnosis not present

## 2020-12-20 DIAGNOSIS — I69828 Other speech and language deficits following other cerebrovascular disease: Secondary | ICD-10-CM | POA: Diagnosis not present

## 2020-12-20 DIAGNOSIS — N3010 Interstitial cystitis (chronic) without hematuria: Secondary | ICD-10-CM | POA: Diagnosis not present

## 2020-12-20 DIAGNOSIS — R2681 Unsteadiness on feet: Secondary | ICD-10-CM | POA: Diagnosis not present

## 2020-12-20 DIAGNOSIS — G8918 Other acute postprocedural pain: Secondary | ICD-10-CM | POA: Diagnosis not present

## 2020-12-20 DIAGNOSIS — T84062A Wear of articular bearing surface of internal prosthetic right knee joint, initial encounter: Secondary | ICD-10-CM | POA: Diagnosis not present

## 2020-12-20 DIAGNOSIS — Z471 Aftercare following joint replacement surgery: Secondary | ICD-10-CM | POA: Diagnosis not present

## 2020-12-20 DIAGNOSIS — Z472 Encounter for removal of internal fixation device: Secondary | ICD-10-CM | POA: Diagnosis not present

## 2020-12-20 DIAGNOSIS — I129 Hypertensive chronic kidney disease with stage 1 through stage 4 chronic kidney disease, or unspecified chronic kidney disease: Secondary | ICD-10-CM | POA: Diagnosis not present

## 2020-12-20 DIAGNOSIS — B3783 Candidal cheilitis: Secondary | ICD-10-CM | POA: Diagnosis not present

## 2020-12-20 DIAGNOSIS — M6281 Muscle weakness (generalized): Secondary | ICD-10-CM | POA: Diagnosis not present

## 2020-12-20 DIAGNOSIS — R41841 Cognitive communication deficit: Secondary | ICD-10-CM | POA: Diagnosis not present

## 2020-12-20 DIAGNOSIS — J479 Bronchiectasis, uncomplicated: Secondary | ICD-10-CM | POA: Diagnosis not present

## 2020-12-20 DIAGNOSIS — L438 Other lichen planus: Secondary | ICD-10-CM | POA: Diagnosis not present

## 2020-12-20 DIAGNOSIS — T8454XA Infection and inflammatory reaction due to internal left knee prosthesis, initial encounter: Secondary | ICD-10-CM | POA: Diagnosis not present

## 2020-12-20 DIAGNOSIS — M25561 Pain in right knee: Secondary | ICD-10-CM | POA: Diagnosis not present

## 2020-12-20 DIAGNOSIS — R1312 Dysphagia, oropharyngeal phase: Secondary | ICD-10-CM | POA: Diagnosis not present

## 2020-12-20 DIAGNOSIS — T84052A Periprosthetic osteolysis of internal prosthetic right knee joint, initial encounter: Secondary | ICD-10-CM | POA: Diagnosis not present

## 2020-12-20 DIAGNOSIS — I700 Atherosclerosis of aorta: Secondary | ICD-10-CM | POA: Diagnosis not present

## 2020-12-20 DIAGNOSIS — I10 Essential (primary) hypertension: Secondary | ICD-10-CM | POA: Diagnosis not present

## 2020-12-20 DIAGNOSIS — T84032A Mechanical loosening of internal right knee prosthetic joint, initial encounter: Secondary | ICD-10-CM | POA: Diagnosis not present

## 2020-12-20 DIAGNOSIS — K50919 Crohn's disease, unspecified, with unspecified complications: Secondary | ICD-10-CM | POA: Diagnosis not present

## 2020-12-20 DIAGNOSIS — E1122 Type 2 diabetes mellitus with diabetic chronic kidney disease: Secondary | ICD-10-CM | POA: Diagnosis not present

## 2020-12-20 DIAGNOSIS — Z96651 Presence of right artificial knee joint: Secondary | ICD-10-CM | POA: Diagnosis not present

## 2020-12-20 DIAGNOSIS — T8484XA Pain due to internal orthopedic prosthetic devices, implants and grafts, initial encounter: Secondary | ICD-10-CM | POA: Diagnosis not present

## 2020-12-20 DIAGNOSIS — T84092A Other mechanical complication of internal right knee prosthesis, initial encounter: Secondary | ICD-10-CM | POA: Diagnosis not present

## 2020-12-20 DIAGNOSIS — D62 Acute posthemorrhagic anemia: Secondary | ICD-10-CM | POA: Diagnosis not present

## 2020-12-20 DIAGNOSIS — E039 Hypothyroidism, unspecified: Secondary | ICD-10-CM | POA: Diagnosis not present

## 2020-12-20 DIAGNOSIS — B370 Candidal stomatitis: Secondary | ICD-10-CM | POA: Diagnosis not present

## 2020-12-24 DIAGNOSIS — M797 Fibromyalgia: Secondary | ICD-10-CM | POA: Diagnosis not present

## 2020-12-24 DIAGNOSIS — R2681 Unsteadiness on feet: Secondary | ICD-10-CM | POA: Diagnosis not present

## 2020-12-24 DIAGNOSIS — R41841 Cognitive communication deficit: Secondary | ICD-10-CM | POA: Diagnosis not present

## 2020-12-24 DIAGNOSIS — E1122 Type 2 diabetes mellitus with diabetic chronic kidney disease: Secondary | ICD-10-CM | POA: Diagnosis not present

## 2020-12-24 DIAGNOSIS — Z96651 Presence of right artificial knee joint: Secondary | ICD-10-CM | POA: Diagnosis not present

## 2020-12-24 DIAGNOSIS — D62 Acute posthemorrhagic anemia: Secondary | ICD-10-CM | POA: Diagnosis not present

## 2020-12-24 DIAGNOSIS — I69891 Dysphagia following other cerebrovascular disease: Secondary | ICD-10-CM | POA: Diagnosis not present

## 2020-12-24 DIAGNOSIS — Z20822 Contact with and (suspected) exposure to covid-19: Secondary | ICD-10-CM | POA: Diagnosis not present

## 2020-12-24 DIAGNOSIS — Z471 Aftercare following joint replacement surgery: Secondary | ICD-10-CM | POA: Diagnosis not present

## 2020-12-24 DIAGNOSIS — M6281 Muscle weakness (generalized): Secondary | ICD-10-CM | POA: Diagnosis not present

## 2020-12-24 DIAGNOSIS — K5090 Crohn's disease, unspecified, without complications: Secondary | ICD-10-CM | POA: Diagnosis not present

## 2020-12-24 DIAGNOSIS — R262 Difficulty in walking, not elsewhere classified: Secondary | ICD-10-CM | POA: Diagnosis not present

## 2020-12-24 DIAGNOSIS — I69828 Other speech and language deficits following other cerebrovascular disease: Secondary | ICD-10-CM | POA: Diagnosis not present

## 2020-12-24 DIAGNOSIS — R1312 Dysphagia, oropharyngeal phase: Secondary | ICD-10-CM | POA: Diagnosis not present

## 2020-12-24 DIAGNOSIS — M9702XD Periprosthetic fracture around internal prosthetic left hip joint, subsequent encounter: Secondary | ICD-10-CM | POA: Diagnosis not present

## 2020-12-24 DIAGNOSIS — Z9181 History of falling: Secondary | ICD-10-CM | POA: Diagnosis not present

## 2020-12-25 DIAGNOSIS — Z471 Aftercare following joint replacement surgery: Secondary | ICD-10-CM | POA: Diagnosis not present

## 2020-12-25 DIAGNOSIS — Z96651 Presence of right artificial knee joint: Secondary | ICD-10-CM | POA: Diagnosis not present

## 2020-12-25 DIAGNOSIS — K5090 Crohn's disease, unspecified, without complications: Secondary | ICD-10-CM | POA: Diagnosis not present

## 2020-12-25 DIAGNOSIS — R262 Difficulty in walking, not elsewhere classified: Secondary | ICD-10-CM | POA: Diagnosis not present

## 2021-01-03 DIAGNOSIS — Z96651 Presence of right artificial knee joint: Secondary | ICD-10-CM | POA: Diagnosis not present

## 2021-01-03 DIAGNOSIS — R262 Difficulty in walking, not elsewhere classified: Secondary | ICD-10-CM | POA: Diagnosis not present

## 2021-01-03 DIAGNOSIS — Z471 Aftercare following joint replacement surgery: Secondary | ICD-10-CM | POA: Diagnosis not present

## 2021-01-03 DIAGNOSIS — E1122 Type 2 diabetes mellitus with diabetic chronic kidney disease: Secondary | ICD-10-CM | POA: Diagnosis not present

## 2021-01-06 ENCOUNTER — Other Ambulatory Visit: Payer: Self-pay | Admitting: Adult Health

## 2021-01-07 DIAGNOSIS — R262 Difficulty in walking, not elsewhere classified: Secondary | ICD-10-CM | POA: Diagnosis not present

## 2021-01-07 DIAGNOSIS — E1122 Type 2 diabetes mellitus with diabetic chronic kidney disease: Secondary | ICD-10-CM | POA: Diagnosis not present

## 2021-01-07 DIAGNOSIS — Z96651 Presence of right artificial knee joint: Secondary | ICD-10-CM | POA: Diagnosis not present

## 2021-01-07 DIAGNOSIS — Z471 Aftercare following joint replacement surgery: Secondary | ICD-10-CM | POA: Diagnosis not present

## 2021-01-09 ENCOUNTER — Ambulatory Visit: Payer: Medicare PPO | Admitting: Internal Medicine

## 2021-01-09 DIAGNOSIS — Y831 Surgical operation with implant of artificial internal device as the cause of abnormal reaction of the patient, or of later complication, without mention of misadventure at the time of the procedure: Secondary | ICD-10-CM | POA: Diagnosis not present

## 2021-01-09 DIAGNOSIS — T8484XD Pain due to internal orthopedic prosthetic devices, implants and grafts, subsequent encounter: Secondary | ICD-10-CM | POA: Diagnosis not present

## 2021-01-09 DIAGNOSIS — Z96651 Presence of right artificial knee joint: Secondary | ICD-10-CM | POA: Diagnosis not present

## 2021-01-09 DIAGNOSIS — M25461 Effusion, right knee: Secondary | ICD-10-CM | POA: Diagnosis not present

## 2021-01-10 ENCOUNTER — Ambulatory Visit: Payer: Medicare PPO | Admitting: Internal Medicine

## 2021-01-15 ENCOUNTER — Ambulatory Visit (INDEPENDENT_AMBULATORY_CARE_PROVIDER_SITE_OTHER): Payer: Medicare PPO | Admitting: Internal Medicine

## 2021-01-15 ENCOUNTER — Encounter: Payer: Self-pay | Admitting: Internal Medicine

## 2021-01-15 ENCOUNTER — Other Ambulatory Visit: Payer: Self-pay

## 2021-01-15 VITALS — BP 112/82 | HR 81 | Temp 97.1°F | Resp 17 | Ht 63.0 in | Wt 139.0 lb

## 2021-01-15 DIAGNOSIS — E1122 Type 2 diabetes mellitus with diabetic chronic kidney disease: Secondary | ICD-10-CM

## 2021-01-15 DIAGNOSIS — M1711 Unilateral primary osteoarthritis, right knee: Secondary | ICD-10-CM | POA: Diagnosis not present

## 2021-01-15 DIAGNOSIS — Z79899 Other long term (current) drug therapy: Secondary | ICD-10-CM | POA: Diagnosis not present

## 2021-01-15 DIAGNOSIS — Z96651 Presence of right artificial knee joint: Secondary | ICD-10-CM | POA: Diagnosis not present

## 2021-01-15 DIAGNOSIS — N182 Chronic kidney disease, stage 2 (mild): Secondary | ICD-10-CM | POA: Diagnosis not present

## 2021-01-15 DIAGNOSIS — R0989 Other specified symptoms and signs involving the circulatory and respiratory systems: Secondary | ICD-10-CM | POA: Diagnosis not present

## 2021-01-15 LAB — COMPLETE METABOLIC PANEL WITH GFR
AG Ratio: 1.7 (calc) (ref 1.0–2.5)
ALT: 9 U/L (ref 6–29)
AST: 15 U/L (ref 10–35)
Albumin: 4.3 g/dL (ref 3.6–5.1)
Alkaline phosphatase (APISO): 60 U/L (ref 37–153)
BUN/Creatinine Ratio: 22 (calc) (ref 6–22)
BUN: 28 mg/dL — ABNORMAL HIGH (ref 7–25)
CO2: 28 mmol/L (ref 20–32)
Calcium: 10.4 mg/dL (ref 8.6–10.4)
Chloride: 105 mmol/L (ref 98–110)
Creat: 1.27 mg/dL — ABNORMAL HIGH (ref 0.60–1.00)
Globulin: 2.5 g/dL (calc) (ref 1.9–3.7)
Glucose, Bld: 114 mg/dL — ABNORMAL HIGH (ref 65–99)
Potassium: 5.4 mmol/L — ABNORMAL HIGH (ref 3.5–5.3)
Sodium: 143 mmol/L (ref 135–146)
Total Bilirubin: 0.5 mg/dL (ref 0.2–1.2)
Total Protein: 6.8 g/dL (ref 6.1–8.1)
eGFR: 45 mL/min/{1.73_m2} — ABNORMAL LOW (ref >=60)

## 2021-01-15 LAB — CBC WITH DIFFERENTIAL/PLATELET
Absolute Monocytes: 736 cells/uL (ref 200–950)
Basophils Absolute: 120 cells/uL (ref 0–200)
Basophils Relative: 1.5 %
Eosinophils Absolute: 200 cells/uL (ref 15–500)
Eosinophils Relative: 2.5 %
HCT: 41.4 % (ref 35.0–45.0)
Hemoglobin: 13.5 g/dL (ref 11.7–15.5)
Lymphs Abs: 1800 cells/uL (ref 850–3900)
MCH: 31.2 pg (ref 27.0–33.0)
MCHC: 32.6 g/dL (ref 32.0–36.0)
MCV: 95.6 fL (ref 80.0–100.0)
MPV: 9.7 fL (ref 7.5–12.5)
Monocytes Relative: 9.2 %
Neutro Abs: 5144 cells/uL (ref 1500–7800)
Neutrophils Relative %: 64.3 %
Platelets: 382 10*3/uL (ref 140–400)
RBC: 4.33 10*6/uL (ref 3.80–5.10)
RDW: 12.4 % (ref 11.0–15.0)
Total Lymphocyte: 22.5 %
WBC: 8 10*3/uL (ref 3.8–10.8)

## 2021-01-15 NOTE — Progress Notes (Signed)
[place]     [name] was admitted to the hospital on 12/17/2020  and patient was discharged from the hospital on  12/24/2020 to [facility]. The patient now presents for follow up for transition from recent [facility] stay and discharged 8 days ago on July 11.  The day after discharge  our clinical staff contacted the patient to assure stability and schedule a follow up appointment. The discharge summary, medications and diagnostic test results were reviewed before meeting with the patient. The patient was admitted for:   Total knee replacement status, right Primary osteoarthritis of right knee Labile hypertension  Type 2 diabetes mellitus /CKD2     Patient had recent Rt TKA as above & had an uncomplicated po-op course and completed rehab at [facility].       Hospitalization discharge instructions and medications are reconciled with the patient.      Patient is also followed with Hypertension, Hyperlipidemia, T2_NIDDM and Vitamin D Deficiency.      Patient is treated for HTN & BP has been controlled at home. Today's BP is at goal - 112/82. Patient has had no complaints of any cardiac type chest pain, palpitations, dyspnea/orthopnea/PND, dizziness, claudication, or dependent edema.     Hyperlipidemia is controlled with diet & meds. Patient denies myalgias or other med SE's. Last Lipids were at goal except elevated Trig's:  Lab Results  Component Value Date   CHOL 165 10/17/2020   HDL 35 (L) 10/17/2020   LDLCALC 94 10/17/2020   TRIG 238 (H) 10/17/2020   CHOLHDL 4.7 10/17/2020      Also, the patient has history of T2_NIDDM and has had no symptoms of reactive hypoglycemia, diabetic polys, paresthesias or visual blurring.  Last A1c was  Lab Results  Component Value Date   HGBA1C 6.0 (H) 10/17/2020      Further, the patient also has history of Vitamin D Deficiency and supplements vitamin D without any suspected side-effects. Last  vitamin D was   Lab Results  Component Value Date   VD25OH 60 03/20/2020   Current Outpatient Medications on File Prior to Visit  Medication Sig   Adalimumab 40 MG/0.4ML PNKT Inject 40 mg into the skin every 14 (fourteen) days.  (Patient not taking: Reported on 12/14/2020)   ascorbic acid (VITAMIN C) 100 MG tablet Take 100 mg by mouth daily. "Gummy" (Patient not taking: Reported on 12/14/2020)   benzonatate (TESSALON) 200 MG capsule Take 1 cap three times daily as needed for cough. (Patient taking differently: Take 200 mg by mouth 3 (three) times daily as needed for cough.)   bisoprolol (ZEBETA) 10 MG tablet Take    1 tablet    Daily       for BP (Patient taking differently: Take 10 mg by mouth daily.)   buprenorphine (BUTRANS) 5 MCG/HR PTWK Place 1 patch onto the skin once a week. (Patient not taking: Reported on 12/14/2020)   buPROPion (WELLBUTRIN XL) 150 MG 24 hr tablet TAKE 1 TABLET DAILY FOR MOOD, FOCUS & CONCENTRATION   calcium carbonate (OS-CAL) 1250 (500 Ca) MG chewable tablet Chew 1 tablet by mouth daily.   carboxymethylcellulose (REFRESH PLUS) 0.5 % SOLN Place 1 drop into both eyes 3 (three) times daily as needed (dry eyes).   Cholecalciferol 125 MCG (5000 UT) TABS Take 5,000 Units by mouth daily.   clotrimazole (MYCELEX) 10 MG troche Take 1 tablet by mouth daily.   Cyanocobalamin (B-12 SL)  Place 1 each under the tongue daily.    dexamethasone (DECADRON) 4 MG tablet Take 1 tab 3 x day - 3 days, then 2 x day - 3 days, then 1 tab daily FOR tmj   diclofenac Sodium (VOLTAREN) 1 % GEL Apply 4 g topically 4 (four) times daily. (Patient taking differently: Apply 4 g topically 4 (four) times daily as needed (pain).)   doxycycline (VIBRAMYCIN) 100 MG capsule Take  1 capsule  2 x /day  with Meals for Infection (Patient not taking: Reported on 12/14/2020)   DULoxetine (CYMBALTA) 60 MG capsule Take 1 capsule Daily for Chronic Pain   famotidine (PEPCID) 40 MG tablet Take 40 mg by mouth 2 (two) times  daily. (Patient not taking: Reported on 12/14/2020)   fenofibrate micronized (LOFIBRA) 134 MG capsule Take  1 capsule  Daily  for Triglycerides (Blood Fats)   fluconazole (DIFLUCAN) 150 MG tablet Take 150 mg by mouth See admin instructions. Sundays and Thursday   fluocinonide gel (LIDEX) 4.33 % Apply 1 application topically 2 (two) times daily as needed (infection).   fluticasone (FLONASE) 50 MCG/ACT nasal spray USE TWO SPRAYS IN EACH NOSTRIL DAILY AS NEEDED (Patient taking differently: Place 2 sprays into both nostrils daily as needed for allergies.)   gabapentin (NEURONTIN) 600 MG tablet Take 1/2 to 1 tablet 3 to 4 x /Daily as needed for Pain   hydrocortisone 2.5 % ointment Apply 1 application topically daily as needed (itching).   Lactobacillus (PROBIOTIC ACIDOPHILUS PO) Take 1 tablet by mouth daily. (Patient not taking: Reported on 12/14/2020)   levothyroxine (SYNTHROID) 100 MCG tablet TAKE 1 TABLET BY MOUTH ON TUES, WED, THURS, SAT AND SUN. TAKE 1& 1/2 TABLET ON MON AND FRI   meclizine (ANTIVERT) 25 MG tablet TAKE 1 TABLET (25 MG TOTAL) BY MOUTH 3 (THREE) TIMES DAILY AS NEEDED FOR DIZZINESS.   metroNIDAZOLE (FLAGYL) 500 MG tablet Take 500 mg by mouth in the morning and at bedtime.    metroNIDAZOLE (METROGEL) 0.75 % vaginal gel Place 1 Applicatorful vaginally 2 (two) times daily. Use I vaginal application 2 x /day (Patient not taking: Reported on 12/14/2020)   multivitamin (VIT W/EXTRA C) CHEW chewable tablet Chew 1 tablet by mouth daily. (Patient not taking: Reported on 12/14/2020)   neomycin-polymyxin-hydrocortisone (CORTISPORIN) 3.5-10000-1 OTIC suspension Instill  6 to 8 drops  to affected ear  3 to 4 x /day  and occlude with cotton   olmesartan (BENICAR) 40 MG tablet Take 40 mg by mouth daily. Takes 1/2 to 1 tablet at bedtime for blood pressure.   oxybutynin (DITROPAN) 5 MG tablet TAKE 1 TABLET TWICE A DAY FOR BLADDER CONTROL   rOPINIRole (REQUIP) 2 MG tablet TAKE 1 TABLET FOUR TIMES A DAY FOR  RESTLESS LEGS (Patient taking differently: Take 2 mg by mouth in the morning, at noon, in the evening, and at bedtime.)   tiZANidine (ZANAFLEX) 2 MG tablet Take 1 tablet (2 mg total) by mouth every 8 (eight) hours as needed for muscle spasms.   traMADol (ULTRAM) 50 MG tablet Take 50 mg by mouth every 6 (six) hours as needed for moderate pain.   No current facility-administered medications on file prior to visit.   Allergies  Allergen Reactions   Atorvastatin Other (See Comments)    Extreme pain   Codeine Other (See Comments)    Headache   Pravastatin Other (See Comments)    Exreme pain   Statins Other (See Comments)    Extreme pain   Amitriptyline  Other (See Comments)    Unspecified   Carbidopa    Fish Oil Nausea Only   Hydrocodone Other (See Comments)    Itching Other Reaction: Other reaction    Linseed Oil Other (See Comments)    Other Reaction: Other reaction   Nuvigil [Armodafinil] Other (See Comments)    Unspecified   Sulfa Antibiotics Other (See Comments)   Erythromycin Other (See Comments)    Reaction unspecified   Erythromycin Base Rash   Flax Seed [Bio-Flax] Rash    Linseed   Hydrocodone-Acetaminophen Rash   Morphine Nausea And Vomiting and Rash   Nsaids Nausea Only and Other (See Comments)    Other Reaction: Other reaction   Sertraline Rash   Sinemet [Carbidopa W-Levodopa] Rash   Sulfamethoxazole Rash   Tolmetin Nausea Only   PMHx:   Past Medical History:  Diagnosis Date   Arthritis    ASCVD (arteriosclerotic cardiovascular disease)    Closed nondisplaced subtrochanteric fracture of left femur ([place]) 09/03/2017   September 2018.  Status post surgery by Dr. Marcelino Scot   Depression    Fibromyalgia    Gait disorder 10/26/2012   GERD (gastroesophageal reflux disease)    GI bleed    Hyperlipidemia    Hypertension    Hypothyroid    Periprosthetic supracondylar fracture of femur, left  03/23/2017   Restless leg syndrome    Rhinitis 06/25/2010   Sleep apnea     TIA (transient ischemic attack)    3         Vitamin D deficiency    Immunization History  Administered Date(s) Administered   DT  01/03/2015   Influenza  02/27/2016, 04/16/2018   Influenza  03/30/2010   Influenza, High Dose 07/21/2019, 02/16/2020   Influenza 02/27/2016   PFIZER Comirnaty Covid-19 Tri-Sucrose Vaccine 07/13/2020   PFIZER ARS-COV-2 Vacc 09/19/2019, 10/10/2019   Pneumococcal -13 06/30/2005, 04/17/2015, 01/23/2019   Pneumococcal -23 01/06/2017   Pneumococcal-23 06/30/2005   Td 06/30/2004   Zoster, Live 07/01/2007   Past Surgical History:  Procedure Laterality Date   ABDOMINAL HYSTERECTOMY  1992   ANTERIOR CERVICAL DECOMPRESSION/DISCECTOMY FUSION 4 LEVELS N/A 06/14/2015   Procedure: Cervical three-four Cervical four-five Cervical five- six Cervical six- seven  Anterior cervical decompression/diskectomy/fusion;  Surgeon: Erline Levine, MD;  Location: [facility];  Service: Neurosurgery;  Laterality: N/A;  C3-4 C4-5 C5-6 C6-7 Anterior cervical decompression/diskectomy/fusion   [place] REPLACEMENT     2   LEFT  1 RIGHT    KNEE SURGERY     ORIF FEMUR FRACTURE Left 03/24/2017   Procedure: OPEN REDUCTION INTERNAL FIXATION (ORIF) FEMUR FRACTURE;  Surgeon: Altamese [place], MD;  Location: [facility];  Service: Orthopedics;  Laterality: Left;   ROTATOR CUFF REPAIR Left 2005   2 LEFT  1  RIGHT   SHOULDER ADHESION RELEASE     SPINE SURGERY     TOE SURGERY     LEFT        TOE SURGERY     RIGHT  BIG TOE   TONSILLECTOMY  1960   FHx:    Reviewed / unchanged  SHx:    Reviewed / unchanged  Systems Review:  Constitutional: Denies fever, chills, wt changes, headaches, insomnia, fatigue, night sweats, change in appetite. Eyes: Denies redness, blurred vision, diplopia, discharge, itchy, watery eyes.  ENT: Denies discharge, congestion, post nasal drip, epistaxis, sore throat, earache, hearing loss, dental pain, tinnitus, vertigo, sinus  pain, snoring.  CV: Denies chest pain, palpitations, irregular heartbeat, syncope, dyspnea, diaphoresis, orthopnea, PND, claudication or edema. Respiratory: denies cough, dyspnea, DOE, pleurisy, hoarseness, laryngitis, wheezing.  Gastrointestinal: Denies dysphagia, odynophagia, heartburn, reflux, water brash, abdominal pain or cramps, nausea, vomiting, bloating, diarrhea, constipation, hematemesis, melena, hematochezia  or hemorrhoids. Genitourinary: Denies dysuria, frequency, urgency, nocturia, hesitancy, discharge, hematuria or flank pain. Musculoskeletal: Denies arthralgias, myalgias, stiffness, jt. swelling, pain, limping or strain/sprain.  Skin: Denies pruritus, rash, hives, warts, acne, eczema or change in skin lesion(s). Neuro: No weakness, tremor, incoordination, spasms, paresthesia or pain. Psychiatric: Denies confusion, memory loss or sensory loss. Endo: Denies change in weight, skin or hair change.  Heme/Lymph: No excessive bleeding, bruising or enlarged lymph nodes.  Physical Exam  BP 112/82   Pulse 81   Temp (!) 97.1 F (36.2 C)   Resp 17   Ht 5' 3"  (1.6 m)   Wt 139 lb (63 kg)   SpO2 93%   BMI 24.62 kg/m   Appears well nourished, well groomed  and in no distress.  Eyes: PERRLA, EOMs, conjunctiva no swelling or erythema. Sinuses: No frontal/maxillary tenderness ENT/Mouth: EAC's clear, TM's nl w/o erythema, bulging. Nares clear w/o erythema, swelling, exudates. Oropharynx clear without erythema or exudates. Oral hygiene is good. Tongue normal, non obstructing. Hearing intact.  Neck: Supple. Thyroid nl. Car 2+/2+ without bruits, nodes or JVD. Chest: Respirations nl with BS clear & equal w/o rales, rhonchi, wheezing or stridor.  Cor: Heart sounds normal w/ regular rate and rhythm without sig. murmurs, gallops, clicks or rubs. Peripheral pulses normal and equal  without edema.  Abdomen: Soft & bowel sounds normal. Non-tender w/o guarding, rebound, hernias, masses or  organomegaly.  Lymphatics: Unremarkable.  Musculoskeletal: Full ROM all peripheral extremities, joint stability, 5/5 strength and normal gait.  Skin: Warm, dry without exposed rashes, lesions or ecchymosis apparent.  Neuro: Cranial nerves intact, reflexes equal bilaterally. Sensory-motor testing grossly intact. Tendon reflexes grossly intact.  Pysch: Alert & oriented x 3.  Insight and judgement nl & appropriate. No ideations.  Assessment and Plan:  1. Total knee replacement status, right  2. Primary osteoarthritis of right knee  3. Labile hypertension  - Continue medication, monitor blood pressure at home.  - Continue DASH diet.  Reminder to go to the ER if any CP,  SOB, nausea, dizziness, severe HA, changes vision/speech.   - CBC with Differential/Platelet - COMPLETE METABOLIC PANEL WITH GFR  4. Type 2 diabetes mellitus with stage 2 chronic kidney disease, without long-term current use of insulin (HCC)  - Continue diet, exercise, lifestyle modifications.  - Monitor appropriate labs. - Continue supplementation.   - COMPLETE METABOLIC PANEL WITH GFR  5. Medication management  - CBC with Differential/Platelet - COMPLETE METABOLIC PANEL WITH GFR        Discussed  regular exercise, BP monitoring, weight control to achieve/maintain BMI less than 25 and discussed meds and SE's. Recommended labs to assess and monitor clinical status with further disposition pending results of labs. Over 30 minutes of exam, counseling, chart review was performed.   Kirtland Bouchard, MD

## 2021-01-16 NOTE — Progress Notes (Signed)
============================================================ -   Test results slightly outside the reference range are not unusual. If there is anything important, I will review this with you,  otherwise it is considered normal test values.  If you have further questions,  please do not hesitate to contact me at the office or via My Chart.  ============================================================ ============================================================  -  CBC - Normal  Red , WBC & Platelet counts ============================================================ ============================================================  - Kidney functions still look more  dehydrated    Very important to drink adequate amounts of fluids to prevent permanent damage    - Recommend drink at least 6 bottles (16 ounces) of fluids /water /day = 96 Oz ~100 oz  - 100 oz = 3,000 cc or 3 liters / day  - >> That's 1 &1/2 bottles of a 2 liter soda bottle /day !   - otherwise All Else - all  Normal / OK ============================================================ ============================================================

## 2021-01-17 DIAGNOSIS — M25561 Pain in right knee: Secondary | ICD-10-CM | POA: Diagnosis not present

## 2021-01-19 ENCOUNTER — Emergency Department (HOSPITAL_BASED_OUTPATIENT_CLINIC_OR_DEPARTMENT_OTHER)
Admission: EM | Admit: 2021-01-19 | Discharge: 2021-01-20 | Disposition: A | Discharge status: Home / Self Care | Payer: Medicare PPO | Attending: Emergency Medicine | Admitting: Emergency Medicine

## 2021-01-19 ENCOUNTER — Emergency Department (HOSPITAL_BASED_OUTPATIENT_CLINIC_OR_DEPARTMENT_OTHER): Payer: Medicare PPO

## 2021-01-19 ENCOUNTER — Encounter (HOSPITAL_BASED_OUTPATIENT_CLINIC_OR_DEPARTMENT_OTHER): Payer: Self-pay | Admitting: Urology

## 2021-01-19 ENCOUNTER — Other Ambulatory Visit: Payer: Self-pay

## 2021-01-19 ENCOUNTER — Other Ambulatory Visit: Payer: Self-pay | Admitting: Internal Medicine

## 2021-01-19 DIAGNOSIS — N182 Chronic kidney disease, stage 2 (mild): Secondary | ICD-10-CM | POA: Insufficient documentation

## 2021-01-19 DIAGNOSIS — Z79899 Other long term (current) drug therapy: Secondary | ICD-10-CM | POA: Insufficient documentation

## 2021-01-19 DIAGNOSIS — R000 Tachycardia, unspecified: Secondary | ICD-10-CM | POA: Diagnosis not present

## 2021-01-19 DIAGNOSIS — E1122 Type 2 diabetes mellitus with diabetic chronic kidney disease: Secondary | ICD-10-CM | POA: Insufficient documentation

## 2021-01-19 DIAGNOSIS — R002 Palpitations: Secondary | ICD-10-CM

## 2021-01-19 DIAGNOSIS — I129 Hypertensive chronic kidney disease with stage 1 through stage 4 chronic kidney disease, or unspecified chronic kidney disease: Secondary | ICD-10-CM | POA: Insufficient documentation

## 2021-01-19 DIAGNOSIS — E039 Hypothyroidism, unspecified: Secondary | ICD-10-CM | POA: Diagnosis not present

## 2021-01-19 DIAGNOSIS — R0602 Shortness of breath: Secondary | ICD-10-CM

## 2021-01-19 DIAGNOSIS — Z96651 Presence of right artificial knee joint: Secondary | ICD-10-CM | POA: Insufficient documentation

## 2021-01-19 DIAGNOSIS — Z8616 Personal history of COVID-19: Secondary | ICD-10-CM | POA: Insufficient documentation

## 2021-01-19 DIAGNOSIS — Z20822 Contact with and (suspected) exposure to covid-19: Secondary | ICD-10-CM | POA: Diagnosis not present

## 2021-01-19 DIAGNOSIS — I10 Essential (primary) hypertension: Secondary | ICD-10-CM | POA: Diagnosis not present

## 2021-01-19 LAB — COMPREHENSIVE METABOLIC PANEL
ALT: 7 U/L (ref 0–44)
AST: 14 U/L — ABNORMAL LOW (ref 15–41)
Albumin: 4.2 g/dL (ref 3.5–5.0)
Alkaline Phosphatase: 58 U/L (ref 38–126)
Anion gap: 11 (ref 5–15)
BUN: 18 mg/dL (ref 8–23)
CO2: 27 mmol/L (ref 22–32)
Calcium: 10.3 mg/dL (ref 8.9–10.3)
Chloride: 102 mmol/L (ref 98–111)
Creatinine, Ser: 0.85 mg/dL (ref 0.44–1.00)
GFR, Estimated: >60 mL/min (ref >60)
Glucose, Bld: 153 mg/dL — ABNORMAL HIGH (ref 70–99)
Potassium: 3.4 mmol/L — ABNORMAL LOW (ref 3.5–5.1)
Sodium: 140 mmol/L (ref 135–145)
Total Bilirubin: 0.5 mg/dL (ref 0.3–1.2)
Total Protein: 7.3 g/dL (ref 6.5–8.1)

## 2021-01-19 LAB — CBC WITH DIFFERENTIAL/PLATELET
Abs Immature Granulocytes: 0.1 10*3/uL — ABNORMAL HIGH (ref 0.00–0.07)
Basophils Absolute: 0.1 10*3/uL (ref 0.0–0.1)
Basophils Relative: 2 %
Eosinophils Absolute: 0.2 10*3/uL (ref 0.0–0.5)
Eosinophils Relative: 3 %
HCT: 42.2 % (ref 36.0–46.0)
Hemoglobin: 13.7 g/dL (ref 12.0–15.0)
Immature Granulocytes: 2 %
Lymphocytes Relative: 29 %
Lymphs Abs: 1.7 10*3/uL (ref 0.7–4.0)
MCH: 30.7 pg (ref 26.0–34.0)
MCHC: 32.5 g/dL (ref 30.0–36.0)
MCV: 94.6 fL (ref 80.0–100.0)
Monocytes Absolute: 0.7 10*3/uL (ref 0.1–1.0)
Monocytes Relative: 11 %
Neutro Abs: 3.2 10*3/uL (ref 1.7–7.7)
Neutrophils Relative %: 53 %
Platelets: 338 10*3/uL (ref 150–400)
RBC: 4.46 MIL/uL (ref 3.87–5.11)
RDW: 12.8 % (ref 11.5–15.5)
WBC: 6 10*3/uL (ref 4.0–10.5)
nRBC: 0 % (ref 0.0–0.2)

## 2021-01-19 LAB — D-DIMER, QUANTITATIVE: D-Dimer, Quant: 3.64 ug/mL-FEU — ABNORMAL HIGH (ref 0.00–0.50)

## 2021-01-19 LAB — TROPONIN I (HIGH SENSITIVITY): Troponin I (High Sensitivity): 4 ng/L (ref <18)

## 2021-01-19 LAB — BRAIN NATRIURETIC PEPTIDE: B Natriuretic Peptide: 32.3 pg/mL (ref 0.0–100.0)

## 2021-01-19 NOTE — Progress Notes (Signed)
   10 pm                                   My chart message 9:30 pm from patient re: elevated  BP(diastolic) and heart rates in the 130's.                                     Patient recently off of her Bisoprolol 10 mg due to reported  low BP's while at [facility] after recent Rt TKR on 12/17/20 at [facility].                                      Asked patient to check pulse ox O2 sat 80-82% with  pulse ~ 60-62. Patient c/o SOB  / denies CP / has has intermittent pounding  palpitations.                                        Patient relates she had been on Lovenox post op and  relates  ?Dr advised her to restart recently & she's back on Lovenox x 2 days    - Patient is advised  to go immediately to ER                                       to confirm low O2 sat  of 80-82 %   and be evaluated if appropriate to r/o PE or tachy arrhythmia.   ==============================================================  ( To date  - patient has has a total of #25 surgical procedures & no prior DVT or PE)  ==============================================================

## 2021-01-19 NOTE — ED Notes (Signed)
HR fluctuating 90-130 with speaking and movement, Spo2 88-94 with speaking

## 2021-01-19 NOTE — ED Triage Notes (Signed)
Pt state hypertension today, with low Sp02 of 85% at home.  Does not use 0@ at home.  Pcp sent to rule out PE

## 2021-01-20 ENCOUNTER — Encounter (HOSPITAL_BASED_OUTPATIENT_CLINIC_OR_DEPARTMENT_OTHER): Payer: Self-pay | Admitting: Radiology

## 2021-01-20 ENCOUNTER — Emergency Department (HOSPITAL_BASED_OUTPATIENT_CLINIC_OR_DEPARTMENT_OTHER): Payer: Medicare PPO

## 2021-01-20 DIAGNOSIS — R0902 Hypoxemia: Secondary | ICD-10-CM | POA: Diagnosis not present

## 2021-01-20 LAB — TROPONIN I (HIGH SENSITIVITY): Troponin I (High Sensitivity): 4 ng/L (ref <18)

## 2021-01-20 LAB — RESP PANEL BY RT-PCR (FLU A&B, COVID) ARPGX2
Influenza A by PCR: NEGATIVE
Influenza B by PCR: NEGATIVE
SARS Coronavirus 2 by RT PCR: NEGATIVE

## 2021-01-20 MED ORDER — IOHEXOL 350 MG/ML SOLN
75.0000 mL | Freq: Once | INTRAVENOUS | Status: AC | PRN
  Administered 2021-01-20: 75 mL via INTRAVENOUS

## 2021-01-20 NOTE — ED Notes (Signed)
Pt ambulated up hallway and to bathroom with walker. Pt c/o of mild ShOB, states she feels better than when she arrived to ER. SpO2 99%, HR 93, BP 134/86 after ambulating.

## 2021-01-20 NOTE — ED Provider Notes (Signed)
[facility] EMERGENCY DEPT Provider Note   CSN: [id number] Arrival date & time: 01/19/21  2241     History Chief Complaint  Patient presents with   Hypertension   Shortness of Breath    Crystal [name] is a 70 y.o. female.  The history is provided by the patient.  Shortness of Breath Severity:  Moderate Timing:  Intermittent Progression:  Worsening Chronicity:  New Relieved by:  Rest Worsened by:  Activity Associated symptoms: no fever, no syncope and no vomiting   Associated symptoms comment:  Chest tightness Patient with extensive medical history presents with palpitations, shortness of breath, dyspnea on exertion as well as labile blood pressure for the past several days.  She reports feeling increasing shortness of breath with exertion.  She reports some chest tightness.  She also reports that her blood pressure has been up and down at times.  No syncope. She reports right knee surgery back in June, there is now concern for a blood clot She denies known previous history of VTE  She reports she is supposed to be on Lovenox postoperatively.  She just restarted this about 3 days ago    Past Medical History:  Diagnosis Date   Arthritis    ASCVD (arteriosclerotic cardiovascular disease)    Closed nondisplaced subtrochanteric fracture of left femur ([place]) 09/03/2017   September 2018.  Status post surgery by Dr. Marcelino Scot   Depression    Fibromyalgia    Gait disorder 10/26/2012   GERD (gastroesophageal reflux disease)    GI bleed    Hyperlipidemia    Hypertension    Hypothyroid    Periprosthetic supracondylar fracture of femur, left  03/23/2017   Restless leg syndrome    Rhinitis 06/25/2010   Sleep apnea    TIA (transient ischemic attack)    3         Vitamin D deficiency     Patient Active Problem List   Diagnosis Date Noted   Aortic atherosclerosis ([place]) by Chest CT scan on 09/2017 07/05/2020   CKD stage 2 due to type 2 diabetes mellitus  ([place]) 11/16/2019   COVID-19 virus infection 10/19/2019   Presence of artificial knee joint, right 09/20/2019   History of total knee arthroplasty, right 05/17/2019   Pain due to total right knee replacement ([place]) 05/17/2019   History of recurrent TIAs 11/15/2018   Rupture of popliteal cyst of right knee region (complex cyst) 11/03/2018   Diet-controlled type 2 diabetes mellitus ([place]) 07/04/2018   History of adenomatous polyp of colon 10/18/2017   Bronchiectasis without complication ([place]) 28/78/6767   Diverticulosis 10/12/2017   Internal hemorrhoids 10/12/2017   Polypharmacy 04/09/2017   Arthritis of carpometacarpal (CMC) joints of both thumbs 02/12/2017   Cervical disc disease 01/23/2017   Constipation 01/23/2017   Crohn's ileitis ([place]) 01/23/2017   S/P revision of total knee, left 12/18/2016   B12 deficiency anemia 06/16/2016   Sleep talking 03/26/2016   IBS (irritable colon syndrome) 08/09/2015   Spinal stenosis in cervical region 06/14/2015   Other specified arthropathy involving shoulder region 11/23/2014   Obesity (BMI 30.0-34.9) 10/31/2014   Medication management 10/25/2013   Rotator cuff arthropathy 09/15/2013   Hyperlipidemia associated with type 2 diabetes mellitus ([place])    Hypothyroid    Depression, major, recurrent, in partial remission ([place])    Vitamin D deficiency    ASCVD    Gait disorder 10/26/2012   Knee pain 09/07/2012   Rotator cuff tear 08/23/2012   Bilateral bunions 11/19/2011  Rhinitis 06/25/2010   Obstructive sleep apnea 06/21/2010   RESTLESS LEG SYNDROME 06/21/2010   Essential hypertension 06/21/2010   GERD 06/21/2010   Crohn's Enterocolitis / Regional enteritis 06/21/2010   Fibromyalgia 06/21/2010    Past Surgical History:  Procedure Laterality Date   ABDOMINAL HYSTERECTOMY  1992   ANTERIOR CERVICAL DECOMPRESSION/DISCECTOMY FUSION 4 LEVELS N/A 06/14/2015   Procedure: Cervical three-four Cervical four-five Cervical five- six Cervical six- seven   Anterior cervical decompression/diskectomy/fusion;  Surgeon: Erline Levine, MD;  Location: [facility];  Service: Neurosurgery;  Laterality: N/A;  C3-4 C4-5 C5-6 C6-7 Anterior cervical decompression/diskectomy/fusion   [place] REPLACEMENT     2   LEFT  1 RIGHT    KNEE SURGERY     ORIF FEMUR FRACTURE Left 03/24/2017   Procedure: OPEN REDUCTION INTERNAL FIXATION (ORIF) FEMUR FRACTURE;  Surgeon: Altamese Carrollton, MD;  Location: [facility];  Service: Orthopedics;  Laterality: Left;   ROTATOR CUFF REPAIR Left 2005   2 LEFT  1  RIGHT   SHOULDER ADHESION RELEASE     SPINE SURGERY     TOE SURGERY     LEFT        TOE SURGERY     RIGHT  BIG TOE   TONSILLECTOMY  1960     OB History   No obstetric history on file.     Family History  Problem Relation Age of Onset   Leukemia Sister    Lupus Sister    Depression Sister    Rheum arthritis Sister    Heart disease Father    Hypertension Father    Hyperlipidemia Father    Neuropathy Father    Cancer Mother    Diabetes Mother    Osteoporosis Mother    Breast cancer Mother    Migraines Daughter     Social History   Tobacco Use   Smoking status: Never   Smokeless tobacco: Never  Vaping Use   Vaping Use: Never used  Substance Use Topics   Alcohol use: Yes    Alcohol/week: 0.0 standard drinks    Comment: rarely   Drug use: No    Home Medications Prior to Admission medications   Medication Sig Start Date End Date Taking? Authorizing Provider  benzonatate (TESSALON) 200 MG capsule Take 1 cap three times daily as needed for cough. Patient taking differently: Take 200 mg by mouth 3 (three) times daily as needed for cough. 07/31/20   [name], NP  bisoprolol (ZEBETA) 10 MG tablet Take    1 tablet    Daily       for BP Patient taking differently: Take 10 mg by mouth daily. 04/16/20   Unk Pinto, MD  buPROPion (WELLBUTRIN XL) 150 MG 24 hr tablet TAKE 1 TABLET DAILY FOR MOOD, FOCUS  & CONCENTRATION 10/02/20   [name], NP  calcium carbonate (OS-CAL) 1250 (500 Ca) MG chewable tablet Chew 1 tablet by mouth daily.    [provider]  carboxymethylcellulose (REFRESH PLUS) 0.5 % SOLN Place 1 drop into both eyes 3 (three) times daily as needed (dry eyes).    [provider]  Cholecalciferol 125 MCG (5000 UT) TABS Take 5,000 Units by mouth daily.    [provider]  clotrimazole (MYCELEX) 10 MG troche Take 1 tablet by mouth daily. 12/26/19   [provider]  Cyanocobalamin (B-12 SL) Place 1 each under the tongue daily.     [provider]  diclofenac Sodium (VOLTAREN) 1 % GEL Apply 4 g topically 4 (four) times daily. Patient taking differently: Apply 4 g topically 4 (four) times daily as needed (pain). 04/23/20   Deno Etienne, DO  DULoxetine (CYMBALTA) 60 MG capsule Take 1 capsule Daily for Chronic Pain 12/03/20   [name], NP  fenofibrate micronized (LOFIBRA) 134 MG capsule Take  1 capsule  Daily  for Triglycerides (Blood Fats) 10/02/20   Unk Pinto, MD  fluconazole (DIFLUCAN) 150 MG tablet Take 150 mg by mouth See admin instructions. Sundays and Thursday    [provider]  fluocinonide gel (LIDEX) 1.51 % Apply 1 application topically 2 (two) times daily as needed (infection). 04/30/20   [provider]  fluticasone (FLONASE) 50 MCG/ACT nasal spray USE TWO SPRAYS IN EACH NOSTRIL DAILY AS NEEDED Patient taking differently: Place 2 sprays into both nostrils daily as needed for allergies. 03/12/20   Vladimir Crofts, PA-C  gabapentin (NEURONTIN) 600 MG tablet Take 1/2 to 1 tablet 3 to 4 x /Daily as needed for Pain 10/17/20   Unk Pinto, MD  hydrocortisone 2.5 % ointment Apply 1 application topically daily as needed (itching).    [provider]  Lactobacillus (PROBIOTIC ACIDOPHILUS PO) Take 1 tablet by mouth daily. Patient not taking: Reported on 12/14/2020    [provider]  levothyroxine  (SYNTHROID) 100 MCG tablet TAKE 1 TABLET BY MOUTH ON TUES, WED, THURS, SAT AND SUN. TAKE 1& 1/2 TABLET ON MON AND FRI 10/06/20   Garnet Sierras, NP  meclizine (ANTIVERT) 25 MG tablet TAKE 1 TABLET (25 MG TOTAL) BY MOUTH 3 (THREE) TIMES DAILY AS NEEDED FOR DIZZINESS. 06/13/18   Unk Pinto, MD  metroNIDAZOLE (FLAGYL) 500 MG tablet Take 500 mg by mouth in the morning and at bedtime.     [provider]  neomycin-polymyxin-hydrocortisone (CORTISPORIN) 3.5-10000-1 OTIC suspension Instill  6 to 8 drops  to affected ear  3 to 4 x /day  and occlude with cotton 12/14/20   Unk Pinto, MD  olmesartan (BENICAR) 40 MG tablet Take 40 mg by mouth daily. Takes 1/2 to 1 tablet at bedtime for blood pressure.    [provider]  oxybutynin (DITROPAN) 5 MG tablet TAKE 1 TABLET TWICE A DAY FOR BLADDER CONTROL 01/06/21   Unk Pinto, MD  rOPINIRole (REQUIP) 2 MG tablet TAKE 1 TABLET FOUR TIMES A DAY FOR RESTLESS LEGS Patient taking differently: Take 2 mg by mouth in the morning, at noon, in the evening, and at bedtime. 04/14/20   Unk Pinto, MD  tiZANidine (ZANAFLEX) 2 MG tablet Take 1 tablet (2 mg total) by mouth every 8 (eight) hours as needed for muscle spasms. 11/03/20 02/01/21  [name], NP  traMADol (ULTRAM) 50 MG tablet Take 50 mg by mouth every 6 (six) hours as needed for moderate pain. 08/16/20   [provider]    Allergies    Atorvastatin, Codeine, Pravastatin, Statins, Amitriptyline, Carbidopa, Fish oil, Hydrocodone, Linseed oil, Nuvigil [armodafinil], Sulfa antibiotics, Erythromycin, Erythromycin base, Flax seed [bio-flax], Hydrocodone-acetaminophen, Morphine, Nsaids, Sertraline, Sinemet [carbidopa w-levodopa], Sulfamethoxazole, and Tolmetin  Review of Systems   Review of Systems  Constitutional:  Negative for fever.  Respiratory:  Positive for shortness of breath.   Cardiovascular:  Negative for syncope.  Gastrointestinal:  Negative for vomiting.   Neurological:  Negative for syncope.  All other systems reviewed and are negative.  Physical Exam Updated Vital Signs BP 111/78   Pulse (!) 106   Temp (!)  97.1 F (36.2 C) (Oral)   Resp 18   Ht 1.6 m (5' 3" )   Wt 63 kg   SpO2 100%   BMI 24.62 kg/m   Physical Exam CONSTITUTIONAL: Elderly anxious HEAD: Normocephalic/atraumatic EYES: EOMI/PERRL ENMT: Mucous membranes moist NECK: supple no meningeal signs SPINE/BACK:entire spine nontender CV: S1/S2 noted, tachycardic LUNGS: Lungs are clear to auscultation bilaterally, no apparent distress ABDOMEN: soft, nontender, no rebound or guarding, bowel sounds noted throughout abdomen GU:no cva tenderness NEURO: Pt is awake/alert/appropriate, moves all extremitiesx4.  No facial droop.   EXTREMITIES: pulses normal/equal, full ROM SKIN: warm, color normal PSYCH: Mildly anxious  ED Results / Procedures / Treatments   Labs (all labs ordered are listed, but only abnormal results are displayed) Labs Reviewed  CBC WITH DIFFERENTIAL/PLATELET - Abnormal; Notable for the following components:      Result Value   Abs Immature Granulocytes 0.10 (*)    All other components within normal limits  COMPREHENSIVE METABOLIC PANEL - Abnormal; Notable for the following components:   Potassium 3.4 (*)    Glucose, Bld 153 (*)    AST 14 (*)    All other components within normal limits  D-DIMER, QUANTITATIVE - Abnormal; Notable for the following components:   D-Dimer, Quant 3.64 (*)    All other components within normal limits  RESP PANEL BY RT-PCR (FLU A&B, COVID) [id number]  BRAIN NATRIURETIC PEPTIDE  TROPONIN I (HIGH SENSITIVITY)  TROPONIN I (HIGH SENSITIVITY)    EKG EKG Interpretation  Date/Time:  Saturday January 19 2021 22:57:01 EDT Ventricular Rate:  127 PR Interval:  166 QRS Duration: 68 QT Interval:  290 QTC Calculation: 421 R Axis:   -11 Text Interpretation: Sinus tachycardia Low voltage QRS Cannot rule out Anterior infarct , age  undetermined Abnormal ECG Confirmed by Ripley Fraise [phone]) on 01/19/2021 11:06:55 PM  Radiology CT Angio Chest PE W and/or Wo Contrast  Result Date: 01/20/2021 CLINICAL DATA:  Hypertension, hypoxia, concern for pulmonary embolism, fluctuating heart rate EXAM: CT ANGIOGRAPHY CHEST WITH CONTRAST TECHNIQUE: Multidetector CT imaging of the chest was performed using the standard protocol during bolus administration of intravenous contrast. Multiplanar CT image reconstructions and MIPs were obtained to evaluate the vascular anatomy. CONTRAST:  52m OMNIPAQUE IOHEXOL 350 MG/ML SOLN COMPARISON:  Radiograph 01/19/2021, CT 09/12/2020 FINDINGS: Cardiovascular: Satisfactory opacification the pulmonary arteries to the segmental level. No pulmonary artery filling defects are identified. Central pulmonary arteries are normal caliber. Normal heart size. No pericardial effusion. The aorta is normal caliber. No acute luminal abnormality of the imaged aorta. No periaortic stranding or hemorrhage. Normal 3 vessel branching of the aortic arch. Proximal great vessels are unremarkable. Mediastinum/Nodes: No mediastinal fluid or gas. Normal thyroid gland and thoracic inlet. No acute abnormality of the trachea or esophagus. No worrisome mediastinal, hilar or axillary adenopathy. Lungs/Pleura: Mild bronchitic changes with airways thickening and scattered secretions. No consolidation, features of edema, pneumothorax, or effusion. No suspicious pulmonary nodules or masses. Upper Abdomen: No acute abnormalities present in the visualized portions of the upper abdomen. Musculoskeletal: Cervical fusion hardware is noted, incompletely visualized on this exam. Multilevel degenerative changes are present in the imaged portions of the spine. No acute osseous abnormality or suspicious osseous lesion. Reverse right shoulder arthroplasty. No worrisome chest wall mass or lesion. Review of the MIP images confirms the above findings. IMPRESSION: No  evidence of pulmonary artery embolism. Mild bronchitic changes. No other acute cardiopulmonary abnormality. Aortic Atherosclerosis (ICD10-I70.0). Electronically Signed   By: PElwin SleightD.  On: 01/20/2021 01:46   DG Chest Port 1 View  Result Date: 01/19/2021 CLINICAL DATA:  Hypertension with low oxygen saturation. EXAM: PORTABLE CHEST 1 VIEW COMPARISON:  September 26, 2020 FINDINGS: There is no evidence of acute infiltrate, pleural effusion or pneumothorax. The heart size and mediastinal contours are within normal limits. A radiopaque fusion plate and screws are seen overlying the lower cervical spine. A radiopaque right shoulder replacement is seen. IMPRESSION: Stable exam without acute cardiopulmonary disease. Electronically Signed   By: Virgina Norfolk M.D.   On: 01/19/2021 23:52    Procedures Procedures   Medications Ordered in ED Medications  iohexol (OMNIPAQUE) 350 MG/ML injection 75 mL (has no administration in time range)    ED Course  I have reviewed the triage vital signs and the nursing notes.  Pertinent labs & imaging results that were available during my care of the patient were reviewed by me and considered in my medical decision making (see chart for details).    MDM Rules/Calculators/A&P                           12:44 AM Patient presents with palpitations shortness of breath.  She is tachycardic and was initially hypoxic.  She is not typically on oxygen Elevated D-dimer, will proceed with CT chest 3:05 AM Extensive work-up was unremarkable.  It is  now felt that initial pulse ox was error  because she has since had no hypoxia Patient reports feeling improved.  CT chest negative for PE.  EKG Interpretation  Date/Time:  Sunday January 20 2021 02:13:42 EDT Ventricular Rate:  97 PR Interval:  191 QRS Duration: 85 QT Interval:  354 QTC Calculation: 450 R Axis:   -10 Text Interpretation: Sinus rhythm Low voltage, extremity and precordial leads Confirmed by Ripley Fraise [phone]) on 01/20/2021 2:31:25 AM       This point the patient can be discharged home She may benefit from outpatient Holter monitoring/cardiac monitoring she does report palpitations Low suspicion for ACS at this time. Final Clinical Impression(s) / ED Diagnoses Final diagnoses:  Palpitations  Shortness of breath    Rx / DC Orders ED Discharge Orders     None        Ripley Fraise, MD 01/20/21 [phone]

## 2021-01-23 DIAGNOSIS — M25561 Pain in right knee: Secondary | ICD-10-CM | POA: Diagnosis not present

## 2021-01-25 DIAGNOSIS — M25561 Pain in right knee: Secondary | ICD-10-CM | POA: Diagnosis not present

## 2021-01-28 DIAGNOSIS — M25561 Pain in right knee: Secondary | ICD-10-CM | POA: Diagnosis not present

## 2021-02-11 DIAGNOSIS — M25561 Pain in right knee: Secondary | ICD-10-CM | POA: Diagnosis not present

## 2021-02-12 ENCOUNTER — Encounter: Payer: Self-pay | Admitting: Internal Medicine

## 2021-02-12 ENCOUNTER — Other Ambulatory Visit: Payer: Self-pay

## 2021-02-12 ENCOUNTER — Ambulatory Visit: Payer: Medicare PPO | Admitting: Internal Medicine

## 2021-02-12 VITALS — BP 128/90 | HR 105 | Temp 97.9°F | Resp 17 | Ht 63.0 in | Wt 133.8 lb

## 2021-02-12 DIAGNOSIS — E1122 Type 2 diabetes mellitus with diabetic chronic kidney disease: Secondary | ICD-10-CM | POA: Diagnosis not present

## 2021-02-12 DIAGNOSIS — Z79899 Other long term (current) drug therapy: Secondary | ICD-10-CM | POA: Diagnosis not present

## 2021-02-12 DIAGNOSIS — R634 Abnormal weight loss: Secondary | ICD-10-CM

## 2021-02-12 DIAGNOSIS — R0989 Other specified symptoms and signs involving the circulatory and respiratory systems: Secondary | ICD-10-CM | POA: Diagnosis not present

## 2021-02-12 DIAGNOSIS — N182 Chronic kidney disease, stage 2 (mild): Secondary | ICD-10-CM | POA: Diagnosis not present

## 2021-02-12 DIAGNOSIS — E039 Hypothyroidism, unspecified: Secondary | ICD-10-CM

## 2021-02-12 DIAGNOSIS — M25551 Pain in right hip: Secondary | ICD-10-CM | POA: Diagnosis not present

## 2021-02-12 NOTE — Progress Notes (Signed)
Future Appointments  Date Time Provider [place]  02/12/2021  4:30 PM Unk Pinto, MD [facility] None  04/08/2021  - CPE 10:00 AM Unk Pinto, MD [facility] None  07/09/2021  - Wellness 11:00 AM [name], NP [facility] None    History of Present Illness:  Patient was seen recently post-op TKA & renal functions had dropped from GFR 74  in April 2021 to then GFR 45  on 01/15/2021 & patient was strongly encouraged to increase fluid intake. At last OV , she had low BP 76/64 and 112/82  and was advised to stay off of he Bisoprolol  10 mg & Benicar 40 mg. In the interim,she had an evaluation at the [facility] ER for  ?  low Pulse Ox at home, but at the ER she had Nl O2's and  mild sinus tachy ~~ 110-130 range. CXR & labs were Normal .    Medications  Current Outpatient Medications (Endocrine & Metabolic):    levothyroxine (SYNTHROID) 100 MCG tablet, TAKE 1 TABLET BY MOUTH ON TUES, WED, THURS, SAT AND SUN. TAKE 1& 1/2 TABLET ON MON AND FRI  Current Outpatient Medications (Cardiovascular):    bisoprolol (ZEBETA) 10 MG tablet, Take    1 tablet    Daily       for BP (Patient taking differently: Take 10 mg by mouth daily.)   fenofibrate micronized (LOFIBRA) 134 MG capsule, Take  1 capsule  Daily  for Triglycerides (Blood Fats)   olmesartan (BENICAR) 40 MG tablet, Take 40 mg by mouth daily. Takes 1/2 to 1 tablet at bedtime for blood pressure.  Current Outpatient Medications (Respiratory):    benzonatate (TESSALON) 200 MG capsule, Take 1 cap three times daily as needed for cough. (Patient taking differently: Take 200 mg by mouth 3 (three) times daily as needed for cough.)   fluticasone (FLONASE) 50 MCG/ACT nasal spray, USE TWO SPRAYS IN EACH NOSTRIL DAILY AS NEEDED (Patient taking differently: Place 2 sprays into both nostrils daily as needed for allergies.)  Current Outpatient Medications (Analgesics):    traMADol (ULTRAM) 50 MG tablet, Take 50 mg by mouth every 6 (six)  hours as needed for moderate pain.  Current Outpatient Medications (Hematological):    Cyanocobalamin (B-12 SL), Place 1 each under the tongue daily.   Current Outpatient Medications (Other):    buPROPion (WELLBUTRIN XL) 150 MG 24 hr tablet, TAKE 1 TABLET DAILY FOR MOOD, FOCUS & CONCENTRATION   calcium carbonate (OS-CAL) 1250 (500 Ca) MG chewable tablet, Chew 1 tablet by mouth daily.   carboxymethylcellulose (REFRESH PLUS) 0.5 % SOLN, Place 1 drop into both eyes 3 (three) times daily as needed (dry eyes).   Cholecalciferol 125 MCG (5000 UT) TABS, Take 5,000 Units by mouth daily.   clotrimazole (MYCELEX) 10 MG troche, Take 1 tablet by mouth daily.   diclofenac Sodium (VOLTAREN) 1 % GEL, Apply 4 g topically 4 (four) times daily. (Patient taking differently: Apply 4 g topically 4 (four) times daily as needed (pain).)   DULoxetine (CYMBALTA) 60 MG capsule, Take 1 capsule Daily for Chronic Pain   fluconazole (DIFLUCAN) 150 MG tablet, Take 150 mg by mouth See admin instructions. Sundays and Thursday   fluocinonide gel (LIDEX) 4.65 %, Apply 1 application topically 2 (two) times daily as needed (infection).   gabapentin (NEURONTIN) 600 MG tablet, Take 1/2 to 1 tablet 3 to 4 x /Daily as needed for Pain   hydrocortisone 2.5 % ointment, Apply 1 application topically daily as needed (itching).  Lactobacillus (PROBIOTIC ACIDOPHILUS PO), Take 1 tablet by mouth daily. (Patient not taking: Reported on 12/14/2020)   meclizine (ANTIVERT) 25 MG tablet, TAKE 1 TABLET (25 MG TOTAL) BY MOUTH 3 (THREE) TIMES DAILY AS NEEDED FOR DIZZINESS.   metroNIDAZOLE (FLAGYL) 500 MG tablet, Take 500 mg by mouth in the morning and at bedtime.    neomycin-polymyxin-hydrocortisone (CORTISPORIN) 3.5-10000-1 OTIC suspension, Instill  6 to 8 drops  to affected ear  3 to 4 x /day  and occlude with cotton   oxybutynin (DITROPAN) 5 MG tablet, TAKE 1 TABLET TWICE A DAY FOR BLADDER CONTROL   rOPINIRole (REQUIP) 2 MG tablet, TAKE 1 TABLET  FOUR TIMES A DAY FOR RESTLESS LEGS (Patient taking differently: Take 2 mg by mouth in the morning, at noon, in the evening, and at bedtime.)  Problem list She has Obstructive sleep apnea; RESTLESS LEG SYNDROME; Essential hypertension; Rhinitis; GERD; Crohn's Enterocolitis / Regional enteritis; Fibromyalgia; Gait disorder; Hyperlipidemia associated with type 2 diabetes mellitus ([place]); Hypothyroid; Depression, major, recurrent, in partial remission ([place]); Vitamin D deficiency; ASCVD; Medication management; Rotator cuff arthropathy; Obesity (BMI 30.0-34.9); Spinal stenosis in cervical region; IBS (irritable colon syndrome); Sleep talking; B12 deficiency anemia; Polypharmacy; Bronchiectasis without complication ([place]); Arthritis of carpometacarpal (CMC) joints of both thumbs; Bilateral bunions; Cervical disc disease; Constipation; Crohn's ileitis ([place]); Rotator cuff tear; Diverticulosis; History of adenomatous polyp of colon; Internal hemorrhoids; Knee pain; Other specified arthropathy involving shoulder region; S/P revision of total knee, left; Diet-controlled type 2 diabetes mellitus ([place]); Rupture of popliteal cyst of right knee region (complex cyst); History of recurrent TIAs; COVID-19 virus infection; CKD stage 2 due to type 2 diabetes mellitus ([place]); History of total knee arthroplasty, right; Pain due to total right knee replacement ([place]); Presence of artificial knee joint, right; and Aortic atherosclerosis ([place]) by Chest CT scan on 09/2017 on their problem list.   Observations/Objective:  BP 188/90   & I rechecked at 122/84 and 126/86   P  105   T                                                                                                                                                                                                                                   97.9 F (36.6 C)   Resp 17   Ht 5' 3"  (1.6 m)   Wt 133 lb 12.8 oz (60.7 kg)   SpO2 98%   BMI 23.70  kg/m   HEENT - WNL. Neck -  supple.  Chest - Clear equal BS. Cor - Nl HS. RRR w/o sig MGR. PP 1(+). No edema. MS- FROM w/o deformities.  Gait Nl. Neuro -  Nl w/o focal abnormalities.  Assessment and Plan:   1. Labile hypertension  - Advised to restart her Bisoprolol  10 mg tab x 1/2 tab = 5 mg  /day - Advised monitor BP's on a bid schedule & return in 2 weeks  - BASIC METABOLIC PANEL WITH GFR - TSH  2. Type 2 diabetes mellitus with stage 2 chronic kidney  disease, without long-term current use of insulin (HCC)  - BASIC METABOLIC PANEL WITH GFR  3. Hypothyroidism, unspecified type  - TSH  4. Weight loss, unintentional  - TSH  5. Medication management  - BASIC METABOLIC PANEL WITH GFR - TSH   Follow Up Instructions:        I discussed the assessment and treatment plan with the patient. The patient was provided an opportunity to ask questions and all were answered. The patient agreed with the plan and demonstrated an understanding of the instructions.       The patient was advised to call back or seek an in-person evaluation if the symptoms worsen or if the condition fails to improve as anticipated.    Kirtland Bouchard, MD

## 2021-02-13 DIAGNOSIS — Z96653 Presence of artificial knee joint, bilateral: Secondary | ICD-10-CM | POA: Diagnosis not present

## 2021-02-13 DIAGNOSIS — I951 Orthostatic hypotension: Secondary | ICD-10-CM | POA: Diagnosis not present

## 2021-02-13 DIAGNOSIS — M5412 Radiculopathy, cervical region: Secondary | ICD-10-CM | POA: Diagnosis not present

## 2021-02-13 DIAGNOSIS — R251 Tremor, unspecified: Secondary | ICD-10-CM | POA: Diagnosis not present

## 2021-02-13 DIAGNOSIS — G255 Other chorea: Secondary | ICD-10-CM | POA: Diagnosis not present

## 2021-02-14 DIAGNOSIS — M25561 Pain in right knee: Secondary | ICD-10-CM | POA: Diagnosis not present

## 2021-02-14 LAB — TSH: TSH: 2 mIU/L (ref 0.40–4.50)

## 2021-02-14 LAB — TEST AUTHORIZATION

## 2021-02-14 LAB — BASIC METABOLIC PANEL WITH GFR
BUN: 17 mg/dL (ref 7–25)
CO2: 28 mmol/L (ref 20–32)
Calcium: 10.3 mg/dL (ref 8.6–10.4)
Chloride: 101 mmol/L (ref 98–110)
Creat: 0.92 mg/dL (ref 0.60–1.00)
Glucose, Bld: 136 mg/dL — ABNORMAL HIGH (ref 65–99)
Potassium: 4.2 mmol/L (ref 3.5–5.3)
Sodium: 139 mmol/L (ref 135–146)
eGFR: 66 mL/min/{1.73_m2} (ref >=60)

## 2021-02-14 NOTE — Progress Notes (Signed)
============================================================ -   Test results slightly outside the reference range are not unusual. If there is anything important, I will review this with you,  otherwise it is considered normal test values.  If you have further questions,  please do not hesitate to contact me at the office or via My Chart.  ============================================================ ============================================================  - Thyroid is Normal  - All Else - CBC - Kidneys - Electrolytes - Liver - all  Normal / OK  ===========================================================

## 2021-02-18 DIAGNOSIS — M25561 Pain in right knee: Secondary | ICD-10-CM | POA: Diagnosis not present

## 2021-02-19 DIAGNOSIS — M797 Fibromyalgia: Secondary | ICD-10-CM | POA: Diagnosis not present

## 2021-02-19 DIAGNOSIS — M170 Bilateral primary osteoarthritis of knee: Secondary | ICD-10-CM | POA: Diagnosis not present

## 2021-02-19 DIAGNOSIS — N182 Chronic kidney disease, stage 2 (mild): Secondary | ICD-10-CM | POA: Diagnosis not present

## 2021-02-19 DIAGNOSIS — T8484XA Pain due to internal orthopedic prosthetic devices, implants and grafts, initial encounter: Secondary | ICD-10-CM | POA: Diagnosis not present

## 2021-02-19 DIAGNOSIS — E785 Hyperlipidemia, unspecified: Secondary | ICD-10-CM | POA: Diagnosis not present

## 2021-02-19 DIAGNOSIS — I129 Hypertensive chronic kidney disease with stage 1 through stage 4 chronic kidney disease, or unspecified chronic kidney disease: Secondary | ICD-10-CM | POA: Diagnosis not present

## 2021-02-19 DIAGNOSIS — Z96651 Presence of right artificial knee joint: Secondary | ICD-10-CM | POA: Diagnosis not present

## 2021-02-19 DIAGNOSIS — E039 Hypothyroidism, unspecified: Secondary | ICD-10-CM | POA: Diagnosis not present

## 2021-02-19 DIAGNOSIS — E1122 Type 2 diabetes mellitus with diabetic chronic kidney disease: Secondary | ICD-10-CM | POA: Diagnosis not present

## 2021-02-19 DIAGNOSIS — M25561 Pain in right knee: Secondary | ICD-10-CM | POA: Diagnosis not present

## 2021-02-19 DIAGNOSIS — T8484XD Pain due to internal orthopedic prosthetic devices, implants and grafts, subsequent encounter: Secondary | ICD-10-CM | POA: Diagnosis not present

## 2021-02-21 ENCOUNTER — Other Ambulatory Visit: Payer: Self-pay | Admitting: Internal Medicine

## 2021-02-21 DIAGNOSIS — T5691XA Toxic effect of unspecified metal, accidental (unintentional), initial encounter: Secondary | ICD-10-CM | POA: Insufficient documentation

## 2021-02-21 DIAGNOSIS — M25561 Pain in right knee: Secondary | ICD-10-CM | POA: Diagnosis not present

## 2021-02-25 DIAGNOSIS — M25561 Pain in right knee: Secondary | ICD-10-CM | POA: Diagnosis not present

## 2021-02-26 DIAGNOSIS — Y831 Surgical operation with implant of artificial internal device as the cause of abnormal reaction of the patient, or of later complication, without mention of misadventure at the time of the procedure: Secondary | ICD-10-CM | POA: Diagnosis not present

## 2021-02-26 DIAGNOSIS — Z96653 Presence of artificial knee joint, bilateral: Secondary | ICD-10-CM | POA: Diagnosis not present

## 2021-02-26 DIAGNOSIS — T8484XD Pain due to internal orthopedic prosthetic devices, implants and grafts, subsequent encounter: Secondary | ICD-10-CM | POA: Diagnosis not present

## 2021-02-26 DIAGNOSIS — Z9889 Other specified postprocedural states: Secondary | ICD-10-CM | POA: Diagnosis not present

## 2021-02-27 DIAGNOSIS — R634 Abnormal weight loss: Secondary | ICD-10-CM | POA: Diagnosis not present

## 2021-02-27 DIAGNOSIS — K5903 Drug induced constipation: Secondary | ICD-10-CM | POA: Diagnosis not present

## 2021-02-27 DIAGNOSIS — M25561 Pain in right knee: Secondary | ICD-10-CM | POA: Diagnosis not present

## 2021-02-27 DIAGNOSIS — K219 Gastro-esophageal reflux disease without esophagitis: Secondary | ICD-10-CM | POA: Diagnosis not present

## 2021-02-27 DIAGNOSIS — T402X5A Adverse effect of other opioids, initial encounter: Secondary | ICD-10-CM | POA: Diagnosis not present

## 2021-02-27 DIAGNOSIS — K5000 Crohn's disease of small intestine without complications: Secondary | ICD-10-CM | POA: Diagnosis not present

## 2021-03-01 ENCOUNTER — Telehealth: Payer: Self-pay

## 2021-03-01 NOTE — Telephone Encounter (Signed)
The patient called to inquire if she should be taking the olmesartan in the evenings. Per Dr. Melford Aase if th patient's blood pressure is high she should take the olmesartan in the evenings; however, if her blood pressure is normal then she should not take it in the evenings. The patient voiced verbal understanding and also reported that she would update this office on her progress.

## 2021-03-03 ENCOUNTER — Other Ambulatory Visit: Payer: Self-pay | Admitting: Adult Health

## 2021-03-06 DIAGNOSIS — M25561 Pain in right knee: Secondary | ICD-10-CM | POA: Diagnosis not present

## 2021-03-07 ENCOUNTER — Encounter: Payer: Self-pay | Admitting: Adult Health

## 2021-03-07 ENCOUNTER — Other Ambulatory Visit: Payer: Self-pay

## 2021-03-07 ENCOUNTER — Ambulatory Visit: Payer: Medicare PPO | Admitting: Adult Health

## 2021-03-07 VITALS — BP 120/78 | HR 89 | Wt 133.0 lb

## 2021-03-07 DIAGNOSIS — Z1152 Encounter for screening for COVID-19: Secondary | ICD-10-CM | POA: Diagnosis not present

## 2021-03-07 DIAGNOSIS — J40 Bronchitis, not specified as acute or chronic: Secondary | ICD-10-CM

## 2021-03-07 DIAGNOSIS — J302 Other seasonal allergic rhinitis: Secondary | ICD-10-CM

## 2021-03-07 LAB — POC COVID19 BINAXNOW: SARS Coronavirus 2 Ag: NEGATIVE

## 2021-03-07 MED ORDER — BENZONATATE 200 MG PO CAPS: ORAL_CAPSULE | ORAL | 1 refills | Status: DC

## 2021-03-07 MED ORDER — DEXAMETHASONE 1 MG PO TABS: ORAL_TABLET | ORAL | 0 refills | Status: DC

## 2021-03-07 NOTE — Progress Notes (Signed)
Assessment and Plan:  Diagnoses and all orders for this visit:  Seasonal allergic rhinitis, unspecified trigger Discussed the importance of avoiding unnecessary antibiotic therapy. Suggested symptomatic OTC remedies. Nasal saline spray for congestion. Nasal steroids, start allergy pill OTC, oral steroids offered Follow up as needed.  Encounter for screening for COVID-19 -     POC COVID-19 - negative   Other orders -     dexamethasone (DECADRON) 1 MG tablet; Take 3 tabs for 2 days, 2 tabs for 2 days 1 tab for 3 days. Take with food. -     benzonatate (TESSALON) 200 MG capsule; Take 1 cap three times daily as needed for cough.  Further disposition pending results of labs. Discussed med's effects and SE's.   Over 15 minutes of exam, counseling, chart review, and critical decision making was performed.   Future Appointments  Date Time Provider [place]  03/19/2021 11:30 AM Unk Pinto, MD [facility] None  04/08/2021 10:00 AM Unk Pinto, MD [facility] None  07/09/2021 11:00 AM [name], NP [facility] None    ------------------------------------------------------------------------------------------------------------------   HPI BP 120/78   Pulse (!) 112   Temp (P) 97.7 F (36.5 C)   Wt 133 lb (60.3 kg)   SpO2 (P) 97%   BMI 23.56 kg/m  70 y.o.female with hx of bronchiectasis and environmental allergies presents for evaluation of sinus sx. She reports had 1 initial positive covid 19 test this AM, second test was negative, rapid covid 19 was negative here prior to evaluation. She had covid 19 vaccine x 2 + booster in 06/2020.   Sx began with feeling vaguely unwell 2 days ago, yesterday developed left sided sinus pressure and ear pressure and echoing in ear without sharp pain. She endorses mild sinus headache. She denies sore throat, has had slight occasional cough. Denies chest congestion, dyspnea. Does feel fatigued today. Has had sneezing, has noted itchy eyes  that preceded sx by about 1 week.  Denies fever.   Has year round allergies. Takes mucus relieve, saline irrigations and fluticasone intermittently. Hasn't been taking antihistamine.   She has been on doxycycline chronically, taking 100 mg BID daily x 3 months per specialist at [facility].   Past Medical History:  Diagnosis Date   Arthritis    ASCVD (arteriosclerotic cardiovascular disease)    Closed nondisplaced subtrochanteric fracture of left femur ([place]) 09/03/2017   September 2018.  Status post surgery by Dr. Marcelino Scot   Depression    Fibromyalgia    Gait disorder 10/26/2012   GERD (gastroesophageal reflux disease)    GI bleed    Hyperlipidemia    Hypertension    Hypothyroid    Periprosthetic supracondylar fracture of femur, left  03/23/2017   Restless leg syndrome    Rhinitis 06/25/2010   Sleep apnea    TIA (transient ischemic attack)    3         Vitamin D deficiency      Allergies  Allergen Reactions   Atorvastatin Other (See Comments)    Extreme pain   Codeine Other (See Comments)    Headache   Pravastatin Other (See Comments)    Exreme pain   Statins Other (See Comments)    Extreme pain   Amitriptyline Other (See Comments)    Unspecified   Carbidopa    Fish Oil Nausea Only   Hydrocodone Other (See Comments)    Itching Other Reaction: Other reaction    Linseed Oil Other (See Comments)    Other Reaction: Other  reaction   Nuvigil [Armodafinil] Other (See Comments)    Unspecified   Sulfa Antibiotics Other (See Comments)   Erythromycin Other (See Comments)    Reaction unspecified   Erythromycin Base Rash   Flax Seed [Bio-Flax] Rash    Linseed   Hydrocodone-Acetaminophen Rash   Morphine Nausea And Vomiting and Rash   Nsaids Nausea Only and Other (See Comments)    Other Reaction: Other reaction   Sertraline Rash   Sinemet [Carbidopa W-Levodopa] Rash   Sulfamethoxazole Rash   Tolmetin Nausea Only    Current Outpatient Medications on File Prior to Visit   Medication Sig   benzonatate (TESSALON) 200 MG capsule Take 1 cap three times daily as needed for cough. (Patient taking differently: Take 200 mg by mouth 3 (three) times daily as needed for cough.)   bisoprolol (ZEBETA) 10 MG tablet Take    1 tablet    Daily       for BP (Patient taking differently: Take 10 mg by mouth daily.)   buPROPion (WELLBUTRIN XL) 150 MG 24 hr tablet TAKE 1 TABLET DAILY FOR MOOD, FOCUS & CONCENTRATION   calcium carbonate (OS-CAL) 1250 (500 Ca) MG chewable tablet Chew 1 tablet by mouth daily.   carboxymethylcellulose (REFRESH PLUS) 0.5 % SOLN Place 1 drop into both eyes 3 (three) times daily as needed (dry eyes).   Cholecalciferol 125 MCG (5000 UT) TABS Take 5,000 Units by mouth daily.   clotrimazole (MYCELEX) 10 MG troche Take 1 tablet by mouth daily.   Cyanocobalamin (B-12 SL) Place 1 each under the tongue daily.    diclofenac Sodium (VOLTAREN) 1 % GEL Apply 4 g topically 4 (four) times daily. (Patient taking differently: Apply 4 g topically 4 (four) times daily as needed (pain).)   DULoxetine (CYMBALTA) 60 MG capsule Take 1 capsule Daily for Chronic Pain   fenofibrate micronized (LOFIBRA) 134 MG capsule Take  1 capsule  Daily  for Triglycerides (Blood Fats)   fluconazole (DIFLUCAN) 150 MG tablet Take 150 mg by mouth See admin instructions. Sundays and Thursday   fluocinonide gel (LIDEX) 7.67 % Apply 1 application topically 2 (two) times daily as needed (infection).   fluticasone (FLONASE) 50 MCG/ACT nasal spray USE TWO SPRAYS IN EACH NOSTRIL DAILY AS NEEDED (Patient taking differently: Place 2 sprays into both nostrils daily as needed for allergies.)   gabapentin (NEURONTIN) 300 MG capsule TAKE 1 CAPSULE BY MOUTH THREE TIMES A DAY   gabapentin (NEURONTIN) 600 MG tablet Take 1/2 to 1 tablet 3 to 4 x /Daily as needed for Pain   hydrocortisone 2.5 % ointment Apply 1 application topically daily as needed (itching).   Lactobacillus (PROBIOTIC ACIDOPHILUS PO) Take 1 tablet by  mouth daily. (Patient not taking: Reported on 12/14/2020)   levothyroxine (SYNTHROID) 100 MCG tablet TAKE 1 TABLET BY MOUTH ON TUES, WED, THURS, SAT AND SUN. TAKE 1& 1/2 TABLET ON MON AND FRI   meclizine (ANTIVERT) 25 MG tablet TAKE 1 TABLET (25 MG TOTAL) BY MOUTH 3 (THREE) TIMES DAILY AS NEEDED FOR DIZZINESS.   metroNIDAZOLE (FLAGYL) 500 MG tablet Take 500 mg by mouth in the morning and at bedtime.    neomycin-polymyxin-hydrocortisone (CORTISPORIN) 3.5-10000-1 OTIC suspension Instill  6 to 8 drops  to affected ear  3 to 4 x /day  and occlude with cotton   olmesartan (BENICAR) 40 MG tablet Take 40 mg by mouth daily. Takes 1/2 to 1 tablet at bedtime for blood pressure.   oxybutynin (DITROPAN) 5 MG tablet TAKE  1 TABLET TWICE A DAY FOR BLADDER CONTROL   rOPINIRole (REQUIP) 2 MG tablet TAKE 1 TABLET FOUR TIMES A DAY FOR RESTLESS LEGS (Patient taking differently: Take 2 mg by mouth in the morning, at noon, in the evening, and at bedtime.)   tiZANidine (ZANAFLEX) 2 MG tablet Take  1/2 to 1 tablet  every 8 hours  ONLY  if needed for Muscle Spasms   traMADol (ULTRAM) 50 MG tablet Take 50 mg by mouth every 6 (six) hours as needed for moderate pain.   No current facility-administered medications on file prior to visit.    ROS: all negative except above.   Physical Exam:  BP 120/78   Pulse (!) 112   Temp (P) 97.7 F (36.5 C)   Wt 133 lb (60.3 kg)   SpO2 (P) 97%   BMI 23.56 kg/m   General Appearance: Well nourished, in no apparent distress. Eyes: PERRL, conjunctiva no swelling or erythema Sinuses: No Frontal/maxillary tenderness, pressure over left maxillary ENT/Mouth: Ext aud canals clear, TMs without erythema, bulging. No erythema, swelling, or exudate on post pharynx.  Tonsils not swollen or erythematous. Hearing normal. Dry MM.  Neck: Supple Respiratory: Respiratory effort normal, BS equal bilaterally without rales, rhonchi, wheezing or stridor.  Cardio: RRR with no MRGs. Brisk peripheral  pulses without edema.  Abdomen: Soft, + BS.  Non tender. Lymphatics: Non tender without lymphadenopathy.  Musculoskeletal: slow steady gait.  Skin: Warm, dry without rashes, lesions, ecchymosis.  Neuro: Normal muscle tone Psych: Awake and oriented X 3, normal affect, Insight and Judgment appropriate.     Izora Ribas, NP 4:49 PM [facility]

## 2021-03-08 DIAGNOSIS — M48061 Spinal stenosis, lumbar region without neurogenic claudication: Secondary | ICD-10-CM | POA: Diagnosis not present

## 2021-03-08 DIAGNOSIS — M797 Fibromyalgia: Secondary | ICD-10-CM | POA: Diagnosis not present

## 2021-03-08 DIAGNOSIS — M25551 Pain in right hip: Secondary | ICD-10-CM | POA: Diagnosis not present

## 2021-03-08 DIAGNOSIS — M47812 Spondylosis without myelopathy or radiculopathy, cervical region: Secondary | ICD-10-CM | POA: Diagnosis not present

## 2021-03-19 ENCOUNTER — Ambulatory Visit: Payer: Medicare PPO | Admitting: Internal Medicine

## 2021-03-19 ENCOUNTER — Encounter: Payer: Self-pay | Admitting: Internal Medicine

## 2021-03-19 ENCOUNTER — Other Ambulatory Visit: Payer: Self-pay

## 2021-03-19 VITALS — BP 122/85 | HR 90 | Temp 97.1°F | Resp 16 | Ht 63.0 in | Wt 133.6 lb

## 2021-03-19 DIAGNOSIS — R0989 Other specified symptoms and signs involving the circulatory and respiratory systems: Secondary | ICD-10-CM

## 2021-03-19 DIAGNOSIS — M25561 Pain in right knee: Secondary | ICD-10-CM | POA: Diagnosis not present

## 2021-03-19 MED ORDER — BISOPROLOL FUMARATE 5 MG PO TABS: ORAL_TABLET | ORAL | 3 refills | Status: DC

## 2021-03-19 MED ORDER — OLMESARTAN MEDOXOMIL 20 MG PO TABS: ORAL_TABLET | ORAL | 3 refills | Status: DC

## 2021-03-19 NOTE — Progress Notes (Signed)
Future Appointments  Date Time Provider [place]  03/19/2021 11:30 AM Unk Pinto, MD [facility] None  04/08/2021 10:00 AM Unk Pinto, MD [facility] None  07/09/2021 11:00 AM [name], NP [facility] None    History of Present Illness:      Crystal [name] is a very nice 70 yo [name]  with labile HTN, T2_DM/CKD2, Hypothyroidism returning for 1 month f/u  of labile HTN and fluctuating drops in renal functions responding to increasing fluid intake.  Crystal [name] does admit frequent postural lightheadedness & "dizziness" with standing and near falls.  Medications  Current Outpatient Medications (Endocrine & Metabolic):    dexamethasone (DECADRON) 1 MG tablet, Take 3 tabs for 2 days, 2 tabs for 2 days 1 tab for 3 days. Take with food.   levothyroxine (SYNTHROID) 100 MCG tablet, TAKE 1 TABLET BY MOUTH ON TUES, WED, THURS, SAT AND SUN. TAKE 1& 1/2 TABLET ON MON AND FRI  Current Outpatient Medications (Cardiovascular):    bisoprolol (ZEBETA) 10 MG tablet, Take    1 tablet    Daily       for BP (Crystal [name] taking differently: Take 10 mg by mouth daily.)   fenofibrate micronized (LOFIBRA) 134 MG capsule, Take  1 capsule  Daily  for Triglycerides (Blood Fats)   olmesartan (BENICAR) 40 MG tablet, Take 40 mg by mouth daily. Takes 1/2 to 1 tablet at bedtime for blood pressure.  Current Outpatient Medications (Respiratory):    benzonatate (TESSALON) 200 MG capsule, Take 1 cap three times daily as needed for cough.   fluticasone (FLONASE) 50 MCG/ACT nasal spray, USE TWO SPRAYS IN EACH NOSTRIL DAILY AS NEEDED (Crystal [name] taking differently: Place 2 sprays into both nostrils daily as needed for allergies.)  Current Outpatient Medications (Analgesics):    traMADol (ULTRAM) 50 MG tablet, Take 50 mg by mouth every 6 (six) hours as needed for moderate pain.  Current Outpatient Medications (Hematological):    Cyanocobalamin (B-12 SL), Place 1 each under the tongue daily.   Current Outpatient Medications  (Other):    buPROPion (WELLBUTRIN XL) 150 MG 24 hr tablet, TAKE 1 TABLET DAILY FOR MOOD, FOCUS & CONCENTRATION   calcium carbonate (OS-CAL) 1250 (500 Ca) MG chewable tablet, Chew 1 tablet by mouth daily.   carboxymethylcellulose (REFRESH PLUS) 0.5 % SOLN, Place 1 drop into both eyes 3 (three) times daily as needed (dry eyes).   Cholecalciferol 125 MCG (5000 UT) TABS, Take 5,000 Units by mouth daily.   clotrimazole (MYCELEX) 10 MG troche, Take 1 tablet by mouth daily.   diclofenac Sodium (VOLTAREN) 1 % GEL, Apply 4 g topically 4 (four) times daily. (Crystal [name] taking differently: Apply 4 g topically 4 (four) times daily as needed (pain).)   DULoxetine (CYMBALTA) 60 MG capsule, Take 1 capsule Daily for Chronic Pain   fluconazole (DIFLUCAN) 150 MG tablet, Take 150 mg by mouth See admin instructions. Sundays and Thursday   fluocinonide gel (LIDEX) 9.62 %, Apply 1 application topically 2 (two) times daily as needed (infection).   gabapentin (NEURONTIN) 300 MG capsule, TAKE 1 CAPSULE BY MOUTH THREE TIMES A DAY   gabapentin (NEURONTIN) 600 MG tablet, Take 1/2 to 1 tablet 3 to 4 x /Daily as needed for Pain   hydrocortisone 2.5 % ointment, Apply 1 application topically daily as needed (itching).   Lactobacillus (PROBIOTIC ACIDOPHILUS PO), Take 1 tablet by mouth daily.   meclizine (ANTIVERT) 25 MG tablet, TAKE 1 TABLET (25 MG TOTAL) BY MOUTH 3 (THREE) TIMES DAILY AS NEEDED FOR DIZZINESS.  metroNIDAZOLE (FLAGYL) 500 MG tablet, Take 500 mg by mouth in the morning and at bedtime.    neomycin-polymyxin-hydrocortisone (CORTISPORIN) 3.5-10000-1 OTIC suspension, Instill  6 to 8 drops  to affected ear  3 to 4 x /day  and occlude with cotton   oxybutynin (DITROPAN) 5 MG tablet, TAKE 1 TABLET TWICE A DAY FOR BLADDER CONTROL   rOPINIRole (REQUIP) 2 MG tablet, TAKE 1 TABLET FOUR TIMES A DAY FOR RESTLESS LEGS (Crystal [name] taking differently: Take 2 mg by mouth in the morning, at noon, in the evening, and at bedtime.)    tiZANidine (ZANAFLEX) 2 MG tablet, Take  1/2 to 1 tablet  every 8 hours  ONLY  if needed for Muscle Spasms  Problem list She has Obstructive sleep apnea; RESTLESS LEG SYNDROME; Essential hypertension; Rhinitis; GERD; Crohn's Enterocolitis / Regional enteritis; Fibromyalgia; Gait disorder; Hyperlipidemia associated with type 2 diabetes mellitus ([place]); Hypothyroid; Depression, major, recurrent, in partial remission ([place]); Vitamin D deficiency; ASCVD; Medication management; Rotator cuff arthropathy; Obesity (BMI 30.0-34.9); Spinal stenosis in cervical region; IBS (irritable colon syndrome); Sleep talking; B12 deficiency anemia; Polypharmacy; Bronchiectasis without complication ([place]); Arthritis of carpometacarpal (CMC) joints of both thumbs; Bilateral bunions; Cervical disc disease; Constipation; Crohn's ileitis ([place]); Rotator cuff tear; Diverticulosis; History of adenomatous polyp of colon; Internal hemorrhoids; Knee pain; Other specified arthropathy involving shoulder region; S/P revision of total knee, left; Diet-controlled type 2 diabetes mellitus ([place]); Rupture of popliteal cyst of right knee region (complex cyst); History of recurrent TIAs; COVID-19 virus infection; CKD stage 2 due to type 2 diabetes mellitus ([place]); History of total knee arthroplasty, right; Pain due to total right knee replacement ([place]); Presence of artificial knee joint, right; and Aortic atherosclerosis ([place]) by Chest CT scan on 09/2017 on their problem list.   Observations/Objective:  BP 122/85   Pulse 90   Temp (!) 97.1 F (36.2 C)   Resp 16   Ht 5' 3"  (1.6 m)   Wt 133 lb 9.6 oz (60.6 kg)   SpO2 97%   BMI 23.67 kg/m   Postural      Sitting BP 117/99   P 89         &        Standing BP 101/62      P 62  HEENT - WNL. Neck - supple.  Chest - Clear equal BS. Cor - Nl HS. RRR w/o sig MGR. PP 1(+). No edema. MS- FROM w/o deformities.  Gait Nl. Neuro -  Nl w/o focal abnormalities.   Assessment and Plan:   1. Labile  hypertension  - Taper BP meds to reduce risk of falls  - bisoprolol (ZEBETA) 5 MG tablet;  Take  1/2 (to 1  tablet ) every Morning  for BP   Dispense: 90 tablet; Refill: 3  - olmesartan (BENICAR) 20 MG tablet;  Take 1/2 (to 1 tablet)  at Night  for BP   Dispense: 90 tablet; Refill: 3   - Recommended monitor sitting & Standing BP's at home.   Follow Up Instructions:        I discussed the assessment and treatment plan with the Crystal [name]. The Crystal [name] was provided an opportunity to ask questions and all were answered. The Crystal [name] agreed with the plan and demonstrated an understanding of the instructions.       The Crystal [name] was advised to call back or seek an in-person evaluation if the symptoms worsen or if the condition fails to improve as anticipated.    Lendon Ka  Melford Aase, MD

## 2021-03-19 NOTE — Patient Instructions (Signed)
Orthostatic Hypotension Blood pressure is a measurement of how strongly, or weakly, your circulating blood is pressing against the walls of your arteries. Orthostatic hypotension is a drop in blood pressure that can happen when you change positions, such as when you go from lying down to standing. Arteries are blood vessels that carry blood from your heart throughout your body. When blood pressure is too low, you may not get enough blood to your brain or to the rest of your organs. Orthostatic hypotension can cause light-headedness, sweating, rapid heartbeat, blurred vision, and fainting. These symptoms require further investigation into the cause. What are the causes? Orthostatic hypotension can be caused by many things, including: Sudden changes in posture, such as standing up quickly after you have been sitting or lying down. Loss of blood (anemia) or loss of body fluids (dehydration). Heart problems, neurologic problems, or hormone problems. Pregnancy. Aging. The risk for this condition increases as you get older. Severe infection (sepsis). Certain medicines, such as medicines for high blood pressure or medicines that make the body lose excess fluids (diuretics). What are the signs or symptoms? Symptoms of this condition may include: Weakness, light-headedness, or dizziness. Sweating. Blurred vision. Tiredness (fatigue). Rapid heartbeat. Fainting, in severe cases. How is this diagnosed? This condition is diagnosed based on: Your symptoms and medical history. Your blood pressure measurements. Your health care provider will check your blood pressure when you are: Lying down. Sitting. Standing. A blood pressure reading is recorded as two numbers, such as "120 over 80" (or 120/80). The first ("top") number is called the systolic pressure. It is a measure of the pressure in your arteries as your heart beats. The second ("bottom") number is called the diastolic pressure. It is a measure of  the pressure in your arteries when your heart relaxes between beats. Blood pressure is measured in a unit called mmHg. Healthy blood pressure for most adults is 120/80 mmHg. Orthostatic hypotension is defined as a 20 mmHg drop in systolic pressure or a 10 mmHg drop in diastolic pressure within 3 minutes of standing. Other information or tests that may be used to diagnose orthostatic hypotension include: Your other vital signs, such as your heart rate and temperature. Blood tests. An electrocardiogram (ECG) or echocardiogram. A Holter monitor. This is a device you wear that records your heart rhythm continuously, usually for 24-48 hours. Tilt table test. For this test, you will be safely secured to a table that moves you from a lying position to an upright position. Your heart rhythm and blood pressure will be monitored during the test. How is this treated? This condition may be treated by: Changing your diet. This may involve eating more salt (sodium) or drinking more water. Changing the dosage of certain medicines you are taking that might be lowering your blood pressure. Correcting the underlying reason for the orthostatic hypotension. Wearing compression stockings. Taking medicines to raise your blood pressure. Avoiding actions that trigger symptoms. Follow these instructions at home: Medicines Take over-the-counter and prescription medicines only as told by your health care provider. Follow instructions from your health care provider about changing the dosage of your current medicines, if this applies. Do not stop or adjust any of your medicines on your own. Eating and drinking  Drink enough fluid to keep your urine pale yellow. Eat extra salt only as directed. Do not add extra salt to your diet unless advised by your health care provider. Eat frequent, small meals. Avoid standing up suddenly after eating. General instructions  Get up slowly from lying down or sitting positions. This  gives your blood pressure a chance to adjust. Avoid hot showers and excessive heat as directed by your health care provider. Engage in regular physical activity as directed by your health care provider. If you have compression stockings, wear them as told. Keep all follow-up visits. This is important. Contact a health care provider if: You have a fever for more than 2-3 days. You feel more thirsty than usual. You feel dizzy or weak. Get help right away if: You have chest pain. You have a fast or irregular heartbeat. You become sweaty or feel light-headed. You feel short of breath. You faint. You have any symptoms of a stroke. "BE FAST" is an easy way to remember the main warning signs of a stroke: B - Balance. Signs are dizziness, sudden trouble walking, or loss of balance. E - Eyes. Signs are trouble seeing or a sudden change in vision. F - Face. Signs are sudden weakness or numbness of the face, or the face or eyelid drooping on one side. A - Arms. Signs are weakness or numbness in an arm. This happens suddenly and usually on one side of the body. S - Speech. Signs are sudden trouble speaking, slurred speech, or trouble understanding what people say. T - Time. Time to call emergency services. Write down what time symptoms started. You have other signs of a stroke, such as: A sudden, severe headache with no known cause. Nausea or vomiting. Seizure. These symptoms may represent a serious problem that is an emergency. Do not wait to see if the symptoms will go away. Get medical help right away. Call your local emergency services (911 in the [place] Do not drive yourself to the hospital. Summary Orthostatic hypotension is a sudden drop in blood pressure. It can cause light-headedness, sweating, rapid heartbeat, blurred vision, and fainting. Orthostatic hypotension can be diagnosed by having your blood pressure taken while lying down, sitting, and then standing. Treatment may involve  changing your diet, wearing compression stockings, sitting up slowly, adjusting your medicines, or correcting the underlying reason for the orthostatic hypotension. Get help right away if you have chest pain, a fast or irregular heartbeat, or symptoms of a stroke. This information is not intended to replace advice given to you by your health care provider. Make sure you discuss any questions you have with your health care provider. Document Revised: 08/30/2020 Document Reviewed: 08/30/2020 Elsevier Patient Education  [place].

## 2021-03-21 DIAGNOSIS — B379 Candidiasis, unspecified: Secondary | ICD-10-CM | POA: Diagnosis not present

## 2021-03-21 DIAGNOSIS — L439 Lichen planus, unspecified: Secondary | ICD-10-CM | POA: Diagnosis not present

## 2021-03-21 DIAGNOSIS — L282 Other prurigo: Secondary | ICD-10-CM | POA: Diagnosis not present

## 2021-03-21 DIAGNOSIS — B370 Candidal stomatitis: Secondary | ICD-10-CM | POA: Diagnosis not present

## 2021-03-22 DIAGNOSIS — M25561 Pain in right knee: Secondary | ICD-10-CM | POA: Diagnosis not present

## 2021-03-25 ENCOUNTER — Other Ambulatory Visit: Payer: Self-pay | Admitting: Internal Medicine

## 2021-03-25 DIAGNOSIS — M25561 Pain in right knee: Secondary | ICD-10-CM | POA: Diagnosis not present

## 2021-03-27 DIAGNOSIS — G255 Other chorea: Secondary | ICD-10-CM | POA: Diagnosis not present

## 2021-03-28 DIAGNOSIS — M25561 Pain in right knee: Secondary | ICD-10-CM | POA: Diagnosis not present

## 2021-04-01 DIAGNOSIS — G255 Other chorea: Secondary | ICD-10-CM | POA: Insufficient documentation

## 2021-04-02 DIAGNOSIS — M25551 Pain in right hip: Secondary | ICD-10-CM | POA: Diagnosis not present

## 2021-04-02 DIAGNOSIS — M545 Low back pain, unspecified: Secondary | ICD-10-CM | POA: Diagnosis not present

## 2021-04-02 DIAGNOSIS — M25552 Pain in left hip: Secondary | ICD-10-CM | POA: Diagnosis not present

## 2021-04-07 NOTE — Progress Notes (Signed)
Annual Screening/Preventative Visit & Comprehensive Evaluation &  Examination  Future Appointments  Date Time Provider [place]  04/08/2021       -   CPE 10:00 AM Unk Pinto, MD [facility] None  07/09/2021        -    Wellness 11:00 AM [name], NP [facility] None  04/08/2022                                                                         10:00 AM Unk Pinto, MD [facility] None        [name] presents for a Screening /Preventative Visit & comprehensive evaluation and management of multiple medical co-morbidities.  Patient has been followed for HTN, HLD, diet controlled T2_DM, Hypothyroidism, OSA/BiPAP (hx/o mask intolerance) and Vitamin D Deficiency. Patient has Crohn's Colitis -  monitored & managed by Dr Earlean Shawl.  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        Patient is followed by Dr Baxter Kail, [facility] for oral Lichen Planus treating with Flagyl & Fluconazole.   Patient also  has a Chronic Pain Syndrome consequent of multiple Neck & back surgeries & is on Cymbalta/Gabapentin to avoid chronic Opioids. In June patient underwent Rt TKR (26th surgery) .         HTN predates since 2006 . Patient's BP has been controlled at home and patient denies any cardiac symptoms as chest pain, palpitations, shortness of breath, dizziness or ankle swelling. Today's BP is at goal -  128/80.        Patient's hyperlipidemia is controlled with diet and medications. Patient denies myalgias or other medication SE's. Last lipids were at goal except elevated trig's:  Lab Results  Component Value Date   CHOL 165 10/17/2020   HDL 35 (L) 10/17/2020   LDLCALC 94 10/17/2020   TRIG 238 (H) 10/17/2020   CHOLHDL 4.7 10/17/2020         Patient has obesity (BMI 30+)  and consequent  diet controlled T2_DM/CKD2 (GFR 75)  since 2016 (A1c 6.9%) and patient denies reactive hypoglycemic symptoms, visual blurring, diabetic polys  or paresthesias. Last A1c was  not at goal:  Lab Results  Component Value Date   HGBA1C 6.0 (H) 10/17/2020         Finally, patient has history of Vitamin D Deficiency ("33" /2008) and last Vitamin D was at goal:  Lab Results  Component Value Date   VD25OH 60 03/20/2020     Current Outpatient Medications on File Prior to Visit  Medication Sig   benzonatate (TESSALON) 200 MG capsule Take 1 cap three times daily as needed for cough.   bisoprolol (ZEBETA) 5 MG tablet Take  1/2 to 1  tablet  every Morning  for BP   buPROPion (WELLBUTRIN XL) 150 MG 24 hr tablet TAKE 1 TABLET DAILY FOR MOOD, FOCUS & CONCENTRATION   calcium carbonate (OS-CAL) 1250 (500 Ca) MG chewable tablet Chew 1 tablet by mouth daily.   carboxymethylcellulose (REFRESH PLUS) 0.5 % SOLN Place 1 drop into both eyes 3 (three) times daily as needed (dry eyes).   Cholecalciferol 125 MCG (5000 UT) TABS Take 5,000 Units  by mouth daily.   clotrimazole (MYCELEX) 10 MG troche Take 1 tablet by mouth daily.   Cyanocobalamin (B-12 SL) Place 1 each under the tongue daily.    diclofenac Sodium (VOLTAREN) 1 % GEL Apply 4 g topically 4 (four) times daily. (Patient taking differently: Apply 4 g topically 4 (four) times daily as needed (pain).)   DULoxetine (CYMBALTA) 60 MG capsule Take 1 capsule Daily for Chronic Pain   fenofibrate micronized (LOFIBRA) 134 MG capsule Take  1 capsule  Daily  for Triglycerides (Blood Fats)   fluconazole (DIFLUCAN) 150 MG tablet Take 150 mg by mouth See admin instructions. Sundays and Thursday   fluocinonide gel (LIDEX) 8.25 % Apply 1 application topically 2 (two) times daily as needed (infection).   fluticasone (FLONASE) 50 MCG/ACT nasal spray USE TWO SPRAYS IN EACH NOSTRIL DAILY AS NEEDED (Patient taking differently: Place 2 sprays into both nostrils daily as needed for allergies.)   gabapentin (NEURONTIN) 300 MG capsule TAKE 1 CAPSULE BY MOUTH THREE TIMES A DAY   gabapentin (NEURONTIN) 600 MG tablet Take  1/2 to 1 tablet 3 to 4 x /Daily as needed for Pain   hydrocortisone 2.5 % ointment Apply 1 application topically daily as needed (itching).   Lactobacillus (PROBIOTIC ACIDOPHILUS PO) Take 1 tablet by mouth daily.   levothyroxine (SYNTHROID) 100 MCG tablet TAKE 1 TABLET BY MOUTH ON TUES, WED, THURS, SAT AND SUN. TAKE 1& 1/2 TABLET ON MON AND FRI   meclizine (ANTIVERT) 25 MG tablet TAKE 1 TABLET (25 MG TOTAL) BY MOUTH 3 (THREE) TIMES DAILY AS NEEDED FOR DIZZINESS.   metroNIDAZOLE (FLAGYL) 500 MG tablet Take 500 mg by mouth in the morning and at bedtime.    neomycin-polymyxin-hydrocortisone (CORTISPORIN) 3.5-10000-1 OTIC suspension Instill  6 to 8 drops  to affected ear  3 to 4 x /day  and occlude with cotton   olmesartan (BENICAR) 20 MG tablet Take 1/2 to 1 tablet  at Night  for BP   oxybutynin (DITROPAN) 5 MG tablet TAKE 1 TABLET TWICE A DAY FOR BLADDER CONTROL   rOPINIRole (REQUIP) 2 MG tablet TAKE 1 TABLET FOUR TIMES A DAY FOR RESTLESS LEGS (Patient taking differently: Take 2 mg by mouth in the morning, at noon, in the evening, and at bedtime.)   tiZANidine (ZANAFLEX) 2 MG tablet Take  1/2 to 1 tablet  every 8 hours  ONLY  if needed for Muscle Spasms   traMADol (ULTRAM) 50 MG tablet Take 50 mg by mouth every 6 (six) hours as needed for moderate pain.     Allergies  Allergen Reactions   Atorvastatin Other (See Comments)    Extreme pain   Codeine Other (See Comments)    Headache   Pravastatin Other (See Comments)    Exreme pain   Statins Other (See Comments)    Extreme pain   Amitriptyline Other (See Comments)    Unspecified   Carbidopa    Fish Oil Nausea Only   Hydrocodone Other (See Comments)    Itching Other Reaction: Other reaction    Linseed Oil Other (See Comments)    Other Reaction: Other reaction   Nuvigil [Armodafinil] Other (See Comments)    Unspecified   Sulfa Antibiotics Other (See Comments)   Erythromycin Other (See Comments)    Reaction unspecified   Erythromycin  Base Rash   Flax Seed [Bio-Flax] Rash    Linseed   Hydrocodone-Acetaminophen Rash   Morphine Nausea And Vomiting and Rash   Nsaids Nausea Only and Other (  See Comments)    Other Reaction: Other reaction   Sertraline Rash   Sinemet [Carbidopa W-Levodopa] Rash   Sulfamethoxazole Rash   Tolmetin Nausea Only     Past Medical History:  Diagnosis Date   Arthritis    ASCVD (arteriosclerotic cardiovascular disease)    Closed nondisplaced subtrochanteric fracture of left femur ([place]) 09/03/2017   September 2018.  Status post surgery by Dr. Marcelino Scot   Depression    Fibromyalgia    Gait disorder 10/26/2012   GERD (gastroesophageal reflux disease)    GI bleed    Hyperlipidemia    Hypertension    Hypothyroid    Periprosthetic supracondylar fracture of femur, left  03/23/2017   Restless leg syndrome    Rhinitis 06/25/2010   Sleep apnea    TIA (transient ischemic attack)    3         Vitamin D deficiency      Health Maintenance  Topic Date Due   Zoster Vaccines- Shingrix (1 of 2) Never done   COVID-19 Vaccine (4 - Booster for Pfizer series) 10/05/2020   INFLUENZA VACCINE  01/28/2021   FOOT EXAM  03/19/2021   HEMOGLOBIN A1C  04/18/2021   OPHTHALMOLOGY EXAM  09/25/2021   MAMMOGRAM  07/09/2022   TETANUS/TDAP  01/02/2025   COLONOSCOPY (Pts 45-59yr Insurance coverage will need to be confirmed)  10/14/2027   DEXA SCAN  Completed   Hepatitis C Screening  Completed   HPV VACCINES  Aged Out     Immunization History  Administered Date(s) Administered   DT  01/03/2015   Influenza   02/27/2016, 04/16/2018   Influenza  03/30/2010   Influenza, High Dose 03/13/2015, 07/21/2019, 02/16/2020   Influenza 03/31/2011, 05/14/2014, 02/27/2016   PFIZER Covid-19 Tri-Sucrose Vacc 09/19/2019, 10/10/2019, 07/13/2020   PFIZER SARS-COV-2 Vacc 09/19/2019, 10/10/2019   Pneumococcal -13 06/30/2005, 04/17/2015, 01/23/2019   Pneumococcal-23 01/06/2017   Pneumococcal-23 06/30/2005   Td 06/30/2004   Zoster,  Live 07/01/2007    Last Colon -  10/03/2017 - Dr MEarlean Shawl  - and scheduled [name] 2023 by Dr MEarlean Shawl  Last MGM - 07/09/2020   Past Surgical History:  Procedure Laterality Date   ABDOMINAL HYSTERECTOMY  1992   ANTERIOR CERVICAL DECOMPRESSION/DISCECTOMY FUSION 4 LEVELS N/A 06/14/2015   Procedure: Cervical three-four Cervical four-five Cervical five- six Cervical six- seven  Anterior cervical decompression/diskectomy/fusion;  Surgeon: JErline Levine MD;  [facility];  Service: Neurosurgery;  Laterality: N/A;  C3-4 C4-5 C5-6 C6-7 Anterior cervical decompression/diskectomy/fusion   [place]PLACEMENT     2   LEFT  1 RIGHT    KNEE SURGERY     ORIF FEMUR FRACTURE Left 03/24/2017   Procedure: OPEN REDUCTION INTERNAL FIXATION (ORIF) FEMUR FRACTURE;  Surgeon: HAltamese [place]atte[place] MD;  [facility]  Service: Orthopedics;  Laterality: Left;   ROTATOR CUFF REPAIR Left 2005   2 LEFT  1  RIGHT   SHOULDER ADHESION RELEASE     SPINE SURGERY     TOE SURGERY     LEFT        TOE SURGERY     RIGHT  BIG TOE   TONSILLECTOMY  1960     Family History  Problem Relation Age of Onset   Leukemia Sister    Lupus Sister    Depression Sister    Rheum arthritis Sister    Heart disease Father    Hypertension Father  Hyperlipidemia Father    Neuropathy Father    Cancer Mother    Diabetes Mother    Osteoporosis Mother    Breast cancer Mother    Migraines Daughter      Social History   Tobacco Use   Smoking status: Never   Smokeless tobacco: Never  Vaping Use   Vaping Use: Never used  Substance Use Topics   Alcohol use: Yes    Alcohol/week: 0.0 standard drinks    Comment: rarely   Drug use: No      ROS Constitutional: Denies fever, chills, weight loss/gain, headaches, insomnia,  night sweats, and change in appetite. Does c/o fatigue. Eyes: Denies redness, blurred vision, diplopia, discharge, itchy, watery eyes.  ENT: Denies  discharge, congestion, post nasal drip, epistaxis, sore throat, earache, hearing loss, dental pain, Tinnitus, Vertigo, Sinus pain, snoring.  Cardio: Denies chest pain, palpitations, irregular heartbeat, syncope, dyspnea, diaphoresis, orthopnea, PND, claudication, edema Respiratory: denies cough, dyspnea, DOE, pleurisy, hoarseness, laryngitis, wheezing.  Gastrointestinal: Denies dysphagia, heartburn, reflux, water brash, pain, cramps, nausea, vomiting, bloating, diarrhea, constipation, hematemesis, melena, hematochezia, jaundice, hemorrhoids Genitourinary: Denies dysuria, frequency, urgency, nocturia, hesitancy, discharge, hematuria, flank pain Breast: Breast lumps, nipple discharge, bleeding.  Musculoskeletal: Denies arthralgia, myalgia, stiffness, Jt. Swelling, pain, limp, and strain/sprain. Denies falls. Skin: Denies puritis, rash, hives, warts, acne, eczema, changing in skin lesion Neuro: No weakness, tremor, incoordination, spasms, paresthesia, pain Psychiatric: Denies confusion, memory loss, sensory loss. Denies Depression. Endocrine: Denies change in weight, skin, hair change, nocturia, and paresthesia, diabetic polys, visual blurring, hyper / hypo glycemic episodes.  Heme/Lymph: No excessive bleeding, bruising, enlarged lymph nodes.  Physical Exam  BP 128/80   Pulse 92   Temp (!) 97.1 F (36.2 C)   Resp 17   Ht 5' 3"  (1.6 m)   Wt 132 lb 9.6 oz (60.1 kg)   SpO2 99%   BMI 23.49 kg/m   General Appearance: Overnourished, well groomed and in no apparent distress.  Eyes: PERRLA, EOMs, conjunctiva no swelling or erythema, normal fundi and vessels. Sinuses: No frontal/maxillary tenderness ENT/Mouth: EACs patent / TMs  nl. Nares clear without erythema, swelling, mucoid exudates. Oral hygiene is good. No erythema, swelling, or exudate. Tongue normal, non-obstructing. Tonsils not swollen or erythematous. Hearing normal.  Neck: Supple, thyroid not palpable. No bruits, nodes or  JVD. Respiratory: Respiratory effort normal.  BS equal and clear bilateral without rales, rhonci, wheezing or stridor. Cardio: Heart sounds are normal with regular rate and rhythm and no murmurs, rubs or gallops. Peripheral pulses are normal and equal bilaterally without edema. No aortic or femoral bruits. Chest: symmetric with normal excursions and percussion. Breasts: Symmetric, without lumps, nipple discharge, retractions, or fibrocystic changes.  Abdomen: Flat, soft with bowel sounds active. Nontender, no guarding, rebound, hernias, masses, or organomegaly.  Lymphatics: Non tender without lymphadenopathy.  Musculoskeletal: Full ROM all peripheral extremities, joint stability, 5/5 strength, and normal gait. Skin: Warm and dry without rashes, lesions, cyanosis, clubbing or  ecchymosis.  Neuro:  Mild Lt hemi-facial paresis with widening of Lt interpalpebral fissure. Cranial nerves intact, reflexes equal bilaterally. Normal muscle tone, no cerebellar symptoms. Sensation intact.  Pysch: Alert and oriented X 3, normal affect, Insight and Judgment appropriate.    Assessment and Plan  1. Annual Preventative Screening Examination  2. Essential hypertension  - EKG 12-Lead - Urinalysis, Routine w reflex microscopic - Microalbumin / creatinine urine ratio - CBC with Differential/Platelet - COMPLETE METABOLIC PANEL WITH GFR - Magnesium - TSH  3. Hyperlipidemia associated  with type 2 diabetes mellitus ([place])  - EKG 12-Lead - Lipid panel - TSH  4. Type 2 diabetes mellitus with stage 2 chronic kidney  disease, without long-term current use of insulin (HCC)  - EKG 12-Lead - HM DIABETES FOOT EXAM - LOW EXTREMITY NEUR EXAM DOCUM - Hemoglobin A1c - Insulin, random  5. Vitamin D deficiency  - VITAMIN D 25 Hydroxy   6. Aortic atherosclerosis ([place]) by Chest CT scan on 09/2017  - EKG 12-Lead - Lipid panel  7. Hypothyroidism  - TSH  8. Crohn's disease of small intestine without  complication ([place])   9. Obstructive sleep apnea  - EKG 12-Lead  10. Screening for colorectal cancer  - POC Hemoccult Bld/Stl   11. Screening for ischemic heart disease  - EKG 12-Lead  12. FHx: heart disease  - EKG 12-Lead  13. Medication management  - Urinalysis, Routine w reflex microscopic - Microalbumin / creatinine urine ratio - CBC with Differential/Platelet - COMPLETE METABOLIC PANEL WITH GFR - Magnesium - Lipid panel - TSH - Hemoglobin A1c - Insulin, random - VITAMIN D 25 Hydroxy          Patient was counseled in prudent diet to achieve/maintain BMI less than 25 for weight control, BP monitoring, regular exercise and medications. Discussed med's effects and SE's. Screening labs and tests as requested with regular follow-up as recommended. Over 40 minutes of exam, counseling, chart review and high complex critical decision making was performed.   Kirtland Bouchard, MD

## 2021-04-07 NOTE — Patient Instructions (Signed)

## 2021-04-08 ENCOUNTER — Other Ambulatory Visit: Payer: Self-pay

## 2021-04-08 ENCOUNTER — Encounter: Payer: Self-pay | Admitting: Internal Medicine

## 2021-04-08 ENCOUNTER — Other Ambulatory Visit: Payer: Self-pay | Admitting: Internal Medicine

## 2021-04-08 ENCOUNTER — Ambulatory Visit (INDEPENDENT_AMBULATORY_CARE_PROVIDER_SITE_OTHER): Payer: Medicare PPO | Admitting: Internal Medicine

## 2021-04-08 DIAGNOSIS — Z136 Encounter for screening for cardiovascular disorders: Secondary | ICD-10-CM

## 2021-04-08 DIAGNOSIS — E039 Hypothyroidism, unspecified: Secondary | ICD-10-CM | POA: Diagnosis not present

## 2021-04-08 DIAGNOSIS — E1169 Type 2 diabetes mellitus with other specified complication: Secondary | ICD-10-CM | POA: Diagnosis not present

## 2021-04-08 DIAGNOSIS — Z0001 Encounter for general adult medical examination with abnormal findings: Secondary | ICD-10-CM

## 2021-04-08 DIAGNOSIS — M25561 Pain in right knee: Secondary | ICD-10-CM | POA: Diagnosis not present

## 2021-04-08 DIAGNOSIS — E785 Hyperlipidemia, unspecified: Secondary | ICD-10-CM | POA: Diagnosis not present

## 2021-04-08 DIAGNOSIS — Z1211 Encounter for screening for malignant neoplasm of colon: Secondary | ICD-10-CM

## 2021-04-08 DIAGNOSIS — I10 Essential (primary) hypertension: Secondary | ICD-10-CM

## 2021-04-08 DIAGNOSIS — Z23 Encounter for immunization: Secondary | ICD-10-CM | POA: Diagnosis not present

## 2021-04-08 DIAGNOSIS — E1122 Type 2 diabetes mellitus with diabetic chronic kidney disease: Secondary | ICD-10-CM

## 2021-04-08 DIAGNOSIS — G4733 Obstructive sleep apnea (adult) (pediatric): Secondary | ICD-10-CM

## 2021-04-08 DIAGNOSIS — G2581 Restless legs syndrome: Secondary | ICD-10-CM

## 2021-04-08 DIAGNOSIS — Z8249 Family history of ischemic heart disease and other diseases of the circulatory system: Secondary | ICD-10-CM

## 2021-04-08 DIAGNOSIS — K5000 Crohn's disease of small intestine without complications: Secondary | ICD-10-CM

## 2021-04-08 DIAGNOSIS — Z0000 Encounter for general adult medical examination without abnormal findings: Secondary | ICD-10-CM

## 2021-04-08 DIAGNOSIS — Z1212 Encounter for screening for malignant neoplasm of rectum: Secondary | ICD-10-CM

## 2021-04-08 DIAGNOSIS — E559 Vitamin D deficiency, unspecified: Secondary | ICD-10-CM

## 2021-04-08 DIAGNOSIS — J309 Allergic rhinitis, unspecified: Secondary | ICD-10-CM

## 2021-04-08 DIAGNOSIS — I700 Atherosclerosis of aorta: Secondary | ICD-10-CM | POA: Diagnosis not present

## 2021-04-08 DIAGNOSIS — Z79899 Other long term (current) drug therapy: Secondary | ICD-10-CM

## 2021-04-08 DIAGNOSIS — N182 Chronic kidney disease, stage 2 (mild): Secondary | ICD-10-CM | POA: Diagnosis not present

## 2021-04-08 DIAGNOSIS — J324 Chronic pansinusitis: Secondary | ICD-10-CM

## 2021-04-08 DIAGNOSIS — J0410 Acute tracheitis without obstruction: Secondary | ICD-10-CM

## 2021-04-08 MED ORDER — FLUTICASONE PROPIONATE 50 MCG/ACT NA SUSP: NASAL | 3 refills | Status: DC

## 2021-04-08 MED ORDER — DEXAMETHASONE 2 MG PO TABS: ORAL_TABLET | ORAL | 0 refills | Status: DC

## 2021-04-09 LAB — COMPLETE METABOLIC PANEL WITH GFR
AG Ratio: 1.8 (calc) (ref 1.0–2.5)
ALT: 9 U/L (ref 6–29)
AST: 16 U/L (ref 10–35)
Albumin: 4 g/dL (ref 3.6–5.1)
Alkaline phosphatase (APISO): 55 U/L (ref 37–153)
BUN: 16 mg/dL (ref 7–25)
CO2: 31 mmol/L (ref 20–32)
Calcium: 9.7 mg/dL (ref 8.6–10.4)
Chloride: 103 mmol/L (ref 98–110)
Creat: 0.63 mg/dL (ref 0.60–1.00)
Globulin: 2.2 g/dL (calc) (ref 1.9–3.7)
Glucose, Bld: 101 mg/dL — ABNORMAL HIGH (ref 65–99)
Potassium: 4 mmol/L (ref 3.5–5.3)
Sodium: 142 mmol/L (ref 135–146)
Total Bilirubin: 0.7 mg/dL (ref 0.2–1.2)
Total Protein: 6.2 g/dL (ref 6.1–8.1)
eGFR: 94 mL/min/{1.73_m2} (ref >=60)

## 2021-04-09 LAB — CBC WITH DIFFERENTIAL/PLATELET
Absolute Monocytes: 673 cells/uL (ref 200–950)
Basophils Absolute: 83 cells/uL (ref 0–200)
Basophils Relative: 1.4 %
Eosinophils Absolute: 83 cells/uL (ref 15–500)
Eosinophils Relative: 1.4 %
HCT: 40.3 % (ref 35.0–45.0)
Hemoglobin: 13.3 g/dL (ref 11.7–15.5)
Lymphs Abs: 1475 cells/uL (ref 850–3900)
MCH: 29.6 pg (ref 27.0–33.0)
MCHC: 33 g/dL (ref 32.0–36.0)
MCV: 89.8 fL (ref 80.0–100.0)
MPV: 9.6 fL (ref 7.5–12.5)
Monocytes Relative: 11.4 %
Neutro Abs: 3587 cells/uL (ref 1500–7800)
Neutrophils Relative %: 60.8 %
Platelets: 288 10*3/uL (ref 140–400)
RBC: 4.49 10*6/uL (ref 3.80–5.10)
RDW: 13.4 % (ref 11.0–15.0)
Total Lymphocyte: 25 %
WBC: 5.9 10*3/uL (ref 3.8–10.8)

## 2021-04-09 LAB — LIPID PANEL
Cholesterol: 142 mg/dL (ref <200)
HDL: 41 mg/dL — ABNORMAL LOW (ref >=50)
LDL Cholesterol (Calc): 76 mg/dL (calc)
Non-HDL Cholesterol (Calc): 101 mg/dL (calc) (ref <130)
Total CHOL/HDL Ratio: 3.5 (calc) (ref <5.0)
Triglycerides: 155 mg/dL — ABNORMAL HIGH (ref <150)

## 2021-04-09 LAB — HEMOGLOBIN A1C
Hgb A1c MFr Bld: 5.5 % of total Hgb (ref <5.7)
Mean Plasma Glucose: 111 mg/dL
eAG (mmol/L): 6.2 mmol/L

## 2021-04-09 LAB — INSULIN, RANDOM: Insulin: 12.1 u[IU]/mL

## 2021-04-09 LAB — VITAMIN D 25 HYDROXY (VIT D DEFICIENCY, FRACTURES): Vit D, 25-Hydroxy: 80 ng/mL (ref 30–100)

## 2021-04-09 LAB — TSH: TSH: 1.95 mIU/L (ref 0.40–4.50)

## 2021-04-09 LAB — MAGNESIUM: Magnesium: 1.8 mg/dL (ref 1.5–2.5)

## 2021-04-09 NOTE — Progress Notes (Signed)
============================================================ -   Test results slightly outside the reference range are not unusual. If there is anything important, I will review this with you,  otherwise it is considered normal test values.  If you have further questions,  please do not hesitate to contact me at the office or via My Chart.  ============================================================ ============================================================  -  Total Chol = 142   and LDL Chol = 76 -   Both    Excellent   - Very low risk for Heart Attack  / Stroke ============================================================ ============================================================  -  A1c = 5.5% and Back in Normal, non-Diabetic range  -  Wonderful ! ============================================================ ============================================================  -  Vitamin D = 80 - Excellent  ! ============================================================ ============================================================  - All Else - CBC - Kidneys - Electrolytes - Liver - Magnesium & Thyroid    - all  Normal / OK ============================================================ ============================================================

## 2021-04-11 DIAGNOSIS — E1169 Type 2 diabetes mellitus with other specified complication: Secondary | ICD-10-CM | POA: Diagnosis not present

## 2021-04-11 DIAGNOSIS — E785 Hyperlipidemia, unspecified: Secondary | ICD-10-CM | POA: Diagnosis not present

## 2021-04-11 DIAGNOSIS — I10 Essential (primary) hypertension: Secondary | ICD-10-CM | POA: Diagnosis not present

## 2021-04-11 DIAGNOSIS — E559 Vitamin D deficiency, unspecified: Secondary | ICD-10-CM | POA: Diagnosis not present

## 2021-04-11 DIAGNOSIS — E039 Hypothyroidism, unspecified: Secondary | ICD-10-CM | POA: Diagnosis not present

## 2021-04-11 DIAGNOSIS — I700 Atherosclerosis of aorta: Secondary | ICD-10-CM | POA: Diagnosis not present

## 2021-04-11 DIAGNOSIS — N182 Chronic kidney disease, stage 2 (mild): Secondary | ICD-10-CM | POA: Diagnosis not present

## 2021-04-11 DIAGNOSIS — E1122 Type 2 diabetes mellitus with diabetic chronic kidney disease: Secondary | ICD-10-CM | POA: Diagnosis not present

## 2021-04-11 DIAGNOSIS — Z79899 Other long term (current) drug therapy: Secondary | ICD-10-CM | POA: Diagnosis not present

## 2021-04-12 LAB — URINALYSIS, ROUTINE W REFLEX MICROSCOPIC
Bacteria, UA: NONE SEEN /HPF
Bilirubin Urine: NEGATIVE
Glucose, UA: NEGATIVE
Hgb urine dipstick: NEGATIVE
Hyaline Cast: NONE SEEN /LPF
Ketones, ur: NEGATIVE
Nitrite: NEGATIVE
Protein, ur: NEGATIVE
Specific Gravity, Urine: 1.02 (ref 1.001–1.035)
Squamous Epithelial / HPF: NONE SEEN /HPF (ref <=5)
WBC, UA: NONE SEEN /HPF (ref 0–5)
pH: 6 (ref 5.0–8.0)

## 2021-04-12 LAB — MICROALBUMIN / CREATININE URINE RATIO
Creatinine, Urine: 75 mg/dL (ref 20–275)
Microalb, Ur: <0.2 mg/dL

## 2021-04-15 DIAGNOSIS — M5416 Radiculopathy, lumbar region: Secondary | ICD-10-CM | POA: Diagnosis not present

## 2021-04-16 DIAGNOSIS — I129 Hypertensive chronic kidney disease with stage 1 through stage 4 chronic kidney disease, or unspecified chronic kidney disease: Secondary | ICD-10-CM | POA: Diagnosis not present

## 2021-04-16 DIAGNOSIS — K219 Gastro-esophageal reflux disease without esophagitis: Secondary | ICD-10-CM | POA: Diagnosis not present

## 2021-04-16 DIAGNOSIS — E039 Hypothyroidism, unspecified: Secondary | ICD-10-CM | POA: Diagnosis not present

## 2021-04-16 DIAGNOSIS — G4730 Sleep apnea, unspecified: Secondary | ICD-10-CM | POA: Diagnosis not present

## 2021-04-16 DIAGNOSIS — E785 Hyperlipidemia, unspecified: Secondary | ICD-10-CM | POA: Diagnosis not present

## 2021-04-16 DIAGNOSIS — Z8673 Personal history of transient ischemic attack (TIA), and cerebral infarction without residual deficits: Secondary | ICD-10-CM | POA: Diagnosis not present

## 2021-04-16 DIAGNOSIS — E1122 Type 2 diabetes mellitus with diabetic chronic kidney disease: Secondary | ICD-10-CM | POA: Diagnosis not present

## 2021-04-16 DIAGNOSIS — T8453XD Infection and inflammatory reaction due to internal right knee prosthesis, subsequent encounter: Secondary | ICD-10-CM | POA: Diagnosis not present

## 2021-04-16 DIAGNOSIS — N182 Chronic kidney disease, stage 2 (mild): Secondary | ICD-10-CM | POA: Diagnosis not present

## 2021-04-17 DIAGNOSIS — T8484XD Pain due to internal orthopedic prosthetic devices, implants and grafts, subsequent encounter: Secondary | ICD-10-CM | POA: Diagnosis not present

## 2021-04-17 DIAGNOSIS — Z96651 Presence of right artificial knee joint: Secondary | ICD-10-CM | POA: Diagnosis not present

## 2021-04-17 DIAGNOSIS — Z471 Aftercare following joint replacement surgery: Secondary | ICD-10-CM | POA: Diagnosis not present

## 2021-05-02 DIAGNOSIS — B379 Candidiasis, unspecified: Secondary | ICD-10-CM | POA: Diagnosis not present

## 2021-05-02 DIAGNOSIS — L439 Lichen planus, unspecified: Secondary | ICD-10-CM | POA: Diagnosis not present

## 2021-05-02 DIAGNOSIS — L858 Other specified epidermal thickening: Secondary | ICD-10-CM | POA: Diagnosis not present

## 2021-05-06 ENCOUNTER — Other Ambulatory Visit: Payer: Self-pay | Admitting: Internal Medicine

## 2021-05-06 ENCOUNTER — Other Ambulatory Visit: Payer: Self-pay

## 2021-05-06 DIAGNOSIS — Z1211 Encounter for screening for malignant neoplasm of colon: Secondary | ICD-10-CM

## 2021-05-06 LAB — POC HEMOCCULT BLD/STL (HOME/3-CARD/SCREEN)
Card #2 Fecal Occult Blod, POC: NEGATIVE
Card #3 Fecal Occult Blood, POC: NEGATIVE
Fecal Occult Blood, POC: NEGATIVE

## 2021-05-07 DIAGNOSIS — Z1212 Encounter for screening for malignant neoplasm of rectum: Secondary | ICD-10-CM | POA: Diagnosis not present

## 2021-05-07 DIAGNOSIS — Z1211 Encounter for screening for malignant neoplasm of colon: Secondary | ICD-10-CM | POA: Diagnosis not present

## 2021-05-08 ENCOUNTER — Ambulatory Visit: Payer: Medicare PPO | Admitting: Internal Medicine

## 2021-05-08 ENCOUNTER — Other Ambulatory Visit: Payer: Self-pay

## 2021-05-08 NOTE — Progress Notes (Deleted)
Future Appointments  Date Time Provider [place]  05/08/2021 11:30 AM Unk Pinto, MD [facility] None  07/09/2021 11:00 AM [name], NP [facility] None  04/08/2022 10:00 AM Unk Pinto, MD [facility] None    History of Present Illness:      [name] with HTN, HLD, diet controlled T2_DM, Hypothyroidism, OSA/BiPAP (hx/o mask intolerance),Vitamin D Deficiency & Crohn's Colitis who presents today    Medications  Current Outpatient Medications (Endocrine & Metabolic):    dexamethasone (DECADRON) 2 MG tablet, Take 1 tab 3 x day - 3 days, then 2 x day - 3 days, then 1 tab daily   levothyroxine (SYNTHROID) 100 MCG tablet, TAKE 1 TABLET BY MOUTH ON TUES, WED, THURS, SAT AND SUN. TAKE 1& 1/2 TABLET ON MON AND FRI  Current Outpatient Medications (Cardiovascular):    bisoprolol (ZEBETA) 5 MG tablet, Take  1/2 to 1  tablet  every Morning  for BP   fenofibrate micronized (LOFIBRA) 134 MG capsule, Take  1 capsule  Daily  for Triglycerides (Blood Fats)   olmesartan (BENICAR) 20 MG tablet, Take 1/2 to 1 tablet  at Night  for BP  Current Outpatient Medications (Respiratory):    benzonatate (TESSALON) 200 MG capsule, Take 1 cap three times daily as needed for cough.   fluticasone (FLONASE) 50 MCG/ACT nasal spray, USE TWO SPRAYS IN EACH NOSTRIL DAILY AS NEEDED (Patient taking differently: Place 2 sprays into both nostrils daily as needed for allergies.)   fluticasone (FLONASE) 50 MCG/ACT nasal spray, USE TWO SPRAYS IN EACH NOSTRIL DAILY AS NEEDED  Current Outpatient Medications (Analgesics):    traMADol (ULTRAM) 50 MG tablet, Take 50 mg by mouth every 6 (six) hours as needed for moderate pain.   Current Outpatient Medications (Other):    buPROPion (WELLBUTRIN XL) 150 MG 24 hr tablet, TAKE 1 TABLET DAILY FOR MOOD, FOCUS & CONCENTRATION   calcium carbonate (OS-CAL) 1250 (500 Ca) MG chewable tablet, Chew 1 tablet by mouth daily.   carboxymethylcellulose  (REFRESH PLUS) 0.5 % SOLN, Place 1 drop into both eyes 3 (three) times daily as needed (dry eyes).   Cholecalciferol 125 MCG (5000 UT) TABS, Take 5,000 Units by mouth daily.   diclofenac Sodium (VOLTAREN) 1 % GEL, Apply 4 g topically 4 (four) times daily. (Patient taking differently: Apply 4 g topically 4 (four) times daily as needed (pain).)   doxycycline (VIBRAMYCIN) 100 MG capsule, TAKE 1 CAPSULE TWICE A DAY WITH MEALS FOR INFECTION   DULoxetine (CYMBALTA) 60 MG capsule, Take 1 capsule Daily for Chronic Pain   fluconazole (DIFLUCAN) 150 MG tablet, Take 150 mg by mouth See admin instructions. Sundays and Thursday   fluocinonide gel (LIDEX) 0.97 %, Apply 1 application topically 2 (two) times daily as needed (infection).   gabapentin (NEURONTIN) 300 MG capsule, TAKE 1 CAPSULE BY MOUTH THREE TIMES A DAY   gabapentin (NEURONTIN) 600 MG tablet, Take 1/2 to 1 tablet 3 to 4 x /Daily as needed for Pain   hydrocortisone 2.5 % ointment, Apply 1 application topically daily as needed (itching).   Lactobacillus (PROBIOTIC ACIDOPHILUS PO), Take 1 tablet by mouth daily.   meclizine (ANTIVERT) 25 MG tablet, TAKE 1 TABLET (25 MG TOTAL) BY MOUTH 3 (THREE) TIMES DAILY AS NEEDED FOR DIZZINESS.   metroNIDAZOLE (FLAGYL) 500 MG tablet, Take 500 mg by mouth in the morning and at bedtime.    neomycin-polymyxin-hydrocortisone (CORTISPORIN) 3.5-10000-1 OTIC suspension, Instill  6 to 8 drops  to affected  ear  3 to 4 x /day  and occlude with cotton   oxybutynin (DITROPAN) 5 MG tablet, TAKE 1 TABLET TWICE A DAY FOR BLADDER CONTROL   rOPINIRole (REQUIP) 2 MG tablet, TAKE 1 TABLET FOUR TIMES A DAY FOR RESTLESS LEGS   tiZANidine (ZANAFLEX) 2 MG tablet, TAKE 1/2 TO 1 TABLET EVERY 8 HOURS ONLY IF NEEDED FOR MUSCLE SPASMS  Problem list She has Obstructive sleep apnea; RESTLESS LEG SYNDROME; Essential hypertension; Rhinitis; GERD; Crohn's Enterocolitis / Regional enteritis; Fibromyalgia; Gait disorder; Hyperlipidemia associated  with type 2 diabetes mellitus ([place]); Hypothyroid; Depression, major, recurrent, in partial remission ([place]); Vitamin D deficiency; ASCVD; Medication management; Rotator cuff arthropathy; Obesity (BMI 30.0-34.9); Spinal stenosis in cervical region; IBS (irritable colon syndrome); Sleep talking; B12 deficiency anemia; Polypharmacy; Bronchiectasis without complication ([place]); Arthritis of carpometacarpal (CMC) joints of both thumbs; Bilateral bunions; Cervical disc disease; Constipation; Crohn's ileitis ([place]); Rotator cuff tear; Diverticulosis; History of adenomatous polyp of colon; Internal hemorrhoids; Knee pain; Other specified arthropathy involving shoulder region; S/P revision of total knee, left; Diet-controlled type 2 diabetes mellitus ([place]); Rupture of popliteal cyst of right knee region (complex cyst); History of recurrent TIAs; COVID-19 virus infection; CKD stage 2 due to type 2 diabetes mellitus ([place]); History of total knee arthroplasty, right; Pain due to total right knee replacement ([place]); Presence of artificial knee joint, right; and Aortic atherosclerosis ([place]) by Chest CT scan on 09/2017 on their problem list.   Observations/Objective:  There were no vitals taken for this visit.  HEENT - WNL. Neck - supple.  Chest - Clear equal BS. Cor - Nl HS. RRR w/o sig MGR. PP 1(+). No edema. MS- FROM w/o deformities.  Gait Nl. Neuro -  Nl w/o focal abnormalities.   Assessment and Plan:      Follow Up Instructions:        I discussed the assessment and treatment plan with the patient. The patient was provided an opportunity to ask questions and all were answered. The patient agreed with the plan and demonstrated an understanding of the instructions.       The patient was advised to call back or seek an in-person evaluation if the symptoms worsen or if the condition fails to improve as anticipated.    Kirtland Bouchard, MD

## 2021-05-09 ENCOUNTER — Encounter: Payer: Self-pay | Admitting: Internal Medicine

## 2021-05-09 ENCOUNTER — Ambulatory Visit (INDEPENDENT_AMBULATORY_CARE_PROVIDER_SITE_OTHER): Payer: Medicare PPO | Admitting: Internal Medicine

## 2021-05-09 VITALS — BP 134/87 | HR 82 | Temp 97.9°F | Resp 16 | Ht 63.0 in | Wt 133.6 lb

## 2021-05-09 DIAGNOSIS — T56891A Toxic effect of other metals, accidental (unintentional), initial encounter: Secondary | ICD-10-CM | POA: Diagnosis not present

## 2021-05-09 DIAGNOSIS — R002 Palpitations: Secondary | ICD-10-CM

## 2021-05-09 DIAGNOSIS — I10 Essential (primary) hypertension: Secondary | ICD-10-CM

## 2021-05-09 DIAGNOSIS — M25561 Pain in right knee: Secondary | ICD-10-CM | POA: Diagnosis not present

## 2021-05-09 DIAGNOSIS — Z136 Encounter for screening for cardiovascular disorders: Secondary | ICD-10-CM

## 2021-05-09 NOTE — Progress Notes (Signed)
Future Appointments  Date Time Provider [place]  05/09/2021  9:30 AM Unk Pinto, MD [facility] None  07/09/2021 11:00 AM [name], NP [facility] None  04/08/2022 10:00 AM Unk Pinto, MD [facility] None    History of Present Illness:                                                 [name]  with HTN, HLD, diet controlled T2_DM, Hypothyroidism, OSA/BiPAP (hx/o mask intolerance) , Crohn's Colitis  and Vitamin D Deficiency - presents with concern of possible elevated Cobalt levels . She was apparently advised of this by one of her physicians at [facility] and advised to make her PCP aware to monitor.  We have not received records to verify. She also has concerns re: her BP & heart beat , but is very vague in describing her concerns. She did mention one of her many [facility] has ordered a test to evaluate for Huntington's Chorea. She also inquired about being treated with "[facility] [place]" and was advised that we do not dispense or prescribe  at this office.  Medications    levothyroxine 100 MCG tablet, TAKE 1 TABLET ON TUES, WED, THURS, SAT AND SUN. TAKE 1& 1/2 TABLET ON MON AND FRI    bisoprolol  5 MG tablet, Take  1/2 to 1  tablet  every Morning  for BP   fenofibrate micronized 134 MG capsule, Take  1 capsule  Daily     olmesartan 20 MG tablet, Take 1/2 to 1 tablet  at Night    FLONASE nasal spray, Place 2 sprays into  nostrils daily as needed     traMADol (ULTRAM) 50 MG tablet, Take  every 6 hours as needed for moderate pain.    buPROPion XL)150 MG 24 hr tablet, TAKE 1 TABLET DAILY   OS-CAL 1250 (MG chewable tablet, Chew 1 tablet daily.   REFRESH 0.5 % SOLN, Place 1 drop into both eyes 3 times daily as needed (dry eyes).   Cholecalciferol 125 MCG (5000 UT) TABS, Take 5,000 Units by mouth daily.   fluconazole (DIFLUCAN) 150 MG tablet, Take 150 mg by mouth See admin instructions. Sundays and Thursday   fluocinonide gel (LIDEX) 4.96 %, Apply  1 application topically 2 (two) times daily as needed (infection).    diclofenac Sodium (VOLTAREN) 1 % GEL, Apply 4 g topically 4 (four) times daily.    DULoxetine (CYMBALTA) 60 MG capsule, Take 1 capsule Daily for Chronic Pain   gabapentin (NEURONTIN) 300 MG capsule, TAKE 1 CAPSULE THREE TIMES A DAY   gabapentin (NEURONTIN) 600 MG tablet, Take 1/2 to 1 tablet 3 to 4 x /Daily as needed for Pain     hydrocortisone 2.5 % ointment, Apply 1 application topically daily as needed (itching).   Lactobacillus / PROBIOTIC , Take 1 tablet by mouth daily.   meclizine (ANTIVERT) 25 MG tablet, TAKE 1 TABLET (25 MG TOTAL) BY MOUTH 3 (THREE) TIMES DAILY AS NEEDED FOR DIZZINESS.   metroNIDAZOLE (FLAGYL) 500 MG tablet, Take 500 mg by mouth in the morning and at bedtime.    neomycin-polymyxin-hydrocortisone (CORTISPORIN) 3.5-10000-1 OTIC suspension, Instill  6 to 8 drops  to affected ear  3 to 4 x /day  and occlude with cotton   oxybutynin (DITROPAN) 5 MG tablet, TAKE 1 TABLET  TWICE A DAY FOR BLADDER CONTROL   rOPINIRole (REQUIP) 2 MG tablet, TAKE 1 TABLET FOUR TIMES A DAY FOR RESTLESS LEGS   tiZANidine (ZANAFLEX) 2 MG tablet, TAKE 1/2 TO 1 TABLET EVERY 8 HOURS ONLY IF NEEDED FOR MUSCLE SPASMS  Problem list She has Obstructive sleep apnea; RESTLESS LEG SYNDROME; Essential hypertension; Rhinitis; GERD; Crohn's Enterocolitis / Regional enteritis; Fibromyalgia; Gait disorder; Hyperlipidemia associated with type 2 diabetes mellitus ([place]); Hypothyroid; Depression, major, recurrent, in partial remission ([place]); Vitamin D deficiency; ASCVD; Medication management; Rotator cuff arthropathy; Obesity (BMI 30.0-34.9); Spinal stenosis in cervical region; IBS (irritable colon syndrome); Sleep talking; B12 deficiency anemia; Polypharmacy; Bronchiectasis without complication ([place]); Arthritis of carpometacarpal (CMC) joints of both thumbs; Bilateral bunions; Cervical disc disease; Constipation; Crohn's ileitis ([place]); Rotator cuff  tear; Diverticulosis; History of adenomatous polyp of colon; Internal hemorrhoids; Knee pain; Other specified arthropathy involving shoulder region; S/P revision of total knee, left; Diet-controlled type 2 diabetes mellitus ([place]); Rupture of popliteal cyst of right knee region (complex cyst); History of recurrent TIAs; COVID-19 virus infection; CKD stage 2 due to type 2 diabetes mellitus ([place]); History of total knee arthroplasty, right; Pain due to total right knee replacement ([place]); Presence of artificial knee joint, right; and Aortic atherosclerosis ([place]) by Chest CT scan on 09/2017 on their problem list.   Observations/Objective:  BP 134/87   Pulse 82   Temp 97.9 F (36.6 C)   Resp 16   Ht 5' 3"  (1.6 m)   Wt 133 lb 9.6 oz (60.6 kg)   SpO2 97%   BMI 23.67 kg/m   HEENT - WNL. Neck - supple.  Chest - Clear equal BS. Cor - Nl HS. RRR w/o sig MGR. PP 1(+). No edema. MS- FROM w/o deformities.  Gait Nl. Neuro -  Nl w/o focal abnormalities.  EKG - shows NSR w/o ectopy & is WNL.  Assessment and Plan:   1. Cobalt possible  poisoning,  toxicity  - Cobalt, Serum/Plasma  2. Essential hypertension  - EKG 12-Lead  3. Palpitations  - EKG 12-Lead  4. Screening for ischemic heart disease  - EKG 12-Lead   Follow Up Instructions:        I discussed the assessment and treatment plan with the patient. The patient was provided an opportunity to ask questions and all were answered. The patient agreed with the plan and demonstrated an understanding of the instructions.       The patient was advised to call back or seek an in-person evaluation if the symptoms worsen or if the condition fails to improve as anticipated.    Kirtland Bouchard, MD

## 2021-05-10 LAB — COBALT, SERUM/PLASMA: Cobalt, Serum/Plasma: 1.4 mcg/L — ABNORMAL HIGH (ref 0.1–0.4)

## 2021-05-11 ENCOUNTER — Other Ambulatory Visit: Payer: Self-pay | Admitting: Internal Medicine

## 2021-05-11 NOTE — Progress Notes (Signed)
============================================================ -   Test results slightly outside the reference range are not unusual. If there is anything important, I will review this with you,  otherwise it is considered normal test values.  If you have further questions,  please do not hesitate to contact me at the office or via My Chart.  ============================================================ ============================================================  -  Cobalt level is measured at 1.4 - elevated   -  After hip joint replacement levels up to 1.8 are frequently reported  &   -  Toxicity is not considered until levels are over 8 ============================================================ ============================================================

## 2021-05-13 DIAGNOSIS — M25561 Pain in right knee: Secondary | ICD-10-CM | POA: Diagnosis not present

## 2021-05-15 ENCOUNTER — Other Ambulatory Visit: Payer: Self-pay | Admitting: Internal Medicine

## 2021-05-15 DIAGNOSIS — M48061 Spinal stenosis, lumbar region without neurogenic claudication: Secondary | ICD-10-CM | POA: Diagnosis not present

## 2021-05-15 DIAGNOSIS — M47812 Spondylosis without myelopathy or radiculopathy, cervical region: Secondary | ICD-10-CM | POA: Diagnosis not present

## 2021-05-15 DIAGNOSIS — M5416 Radiculopathy, lumbar region: Secondary | ICD-10-CM | POA: Diagnosis not present

## 2021-05-15 DIAGNOSIS — M25561 Pain in right knee: Secondary | ICD-10-CM | POA: Diagnosis not present

## 2021-05-17 ENCOUNTER — Other Ambulatory Visit: Payer: Self-pay | Admitting: Internal Medicine

## 2021-05-20 DIAGNOSIS — M48061 Spinal stenosis, lumbar region without neurogenic claudication: Secondary | ICD-10-CM | POA: Diagnosis not present

## 2021-05-20 DIAGNOSIS — M25561 Pain in right knee: Secondary | ICD-10-CM | POA: Diagnosis not present

## 2021-05-27 DIAGNOSIS — M25561 Pain in right knee: Secondary | ICD-10-CM | POA: Diagnosis not present

## 2021-05-28 DIAGNOSIS — M48061 Spinal stenosis, lumbar region without neurogenic claudication: Secondary | ICD-10-CM | POA: Diagnosis not present

## 2021-05-28 DIAGNOSIS — M545 Low back pain, unspecified: Secondary | ICD-10-CM | POA: Diagnosis not present

## 2021-05-29 DIAGNOSIS — M25561 Pain in right knee: Secondary | ICD-10-CM | POA: Diagnosis not present

## 2021-05-30 DIAGNOSIS — G5601 Carpal tunnel syndrome, right upper limb: Secondary | ICD-10-CM | POA: Diagnosis not present

## 2021-05-30 DIAGNOSIS — M19049 Primary osteoarthritis, unspecified hand: Secondary | ICD-10-CM | POA: Diagnosis not present

## 2021-05-30 DIAGNOSIS — M19042 Primary osteoarthritis, left hand: Secondary | ICD-10-CM | POA: Diagnosis not present

## 2021-05-30 DIAGNOSIS — M180 Bilateral primary osteoarthritis of first carpometacarpal joints: Secondary | ICD-10-CM | POA: Diagnosis not present

## 2021-06-04 DIAGNOSIS — M25561 Pain in right knee: Secondary | ICD-10-CM | POA: Diagnosis not present

## 2021-06-06 DIAGNOSIS — M25561 Pain in right knee: Secondary | ICD-10-CM | POA: Diagnosis not present

## 2021-06-07 ENCOUNTER — Other Ambulatory Visit: Payer: Self-pay | Admitting: Internal Medicine

## 2021-06-07 DIAGNOSIS — Z780 Asymptomatic menopausal state: Secondary | ICD-10-CM | POA: Diagnosis not present

## 2021-06-07 DIAGNOSIS — M85832 Other specified disorders of bone density and structure, left forearm: Secondary | ICD-10-CM | POA: Diagnosis not present

## 2021-06-12 DIAGNOSIS — M5416 Radiculopathy, lumbar region: Secondary | ICD-10-CM | POA: Diagnosis not present

## 2021-06-12 DIAGNOSIS — I10 Essential (primary) hypertension: Secondary | ICD-10-CM | POA: Diagnosis not present

## 2021-07-08 NOTE — Progress Notes (Deleted)
MEDICARE ANNUAL WELLNESS VISIT AND FOLLOW UP  Assessment:    Medicare annual wellness visit, subsequent -due next year - advised covid 19 booster, flu booster - not available in office  Essential hypertension -well controlled currently -monitor at home -dash diet -exercise as tolerated   ASCVD (arteriosclerotic cardiovascular disease) -cont to maximize cholesterol management and BP management   Obstructive sleep apnea -emphasized adherence with CPAP    Allergic rhinitis, unspecified allergic rhinitis type -cont meds - suggested she rotate antihistamine q3-6 months  Lifestyle controlled diabetes ([place]) Discussed general issues about diabetes pathophysiology and management., Educational material distributed., Suggested low cholesterol diet., Encouraged aerobic exercise., Discussed foot care., Reminded to get yearly retinal exam.  - Hemoglobin A1c at routine OVs in office q53m   Hypothyroidism, unspecified hypothyroidism type -check TSH level, continue medications the same, reminded to take on an empty stomach 30-74mins before food.  - TSH   Hyperlipidemia associated with T2DM (HCC) - statin intolerant, on fenofibrate and lopid with recent increased dose -continue medications, check lipids, decrease fatty foods, increase activity.  - Lipid panel at q96m OV  CKD 2 associated with T2DM (HCC) Increase fluids, avoid NSAIDS, monitor sugars, will monitor  Vitamin D deficiency -cont supplement  Medication management - CBC, CMP/GFR, magnesium routinely  Gastroesophageal reflux disease, esophagitis presence not specified -cont meds  Crohn's disease with complication, unspecified gastrointestinal tract location [facility]) -followed by Dr. Earlean Shawl, improved with humira   IBS (irritable colon syndrome) -followed by Dr. Earlean Shawl  Fibromyalgia -followed by Dr. Maryjean Ka   RESTLESS LEG SYNDROME -followed by Dr. Maryjean Ka, continue requip   Depression, remission partial, recurrent  ([place]) -cont current meds   Cervical pain -followed by neurosurgery  Spinal stenosis in cervical region -followed by neurosurgery  Anemia due to vitamin B12 deficiency, unspecified B12 deficiency type Continue supplement  Insomnia, unspecified type Sleep hygiene discussed  Osteopenia - get dexa, continue Vit D and Ca, weight bearing exercises  Overweight  Long discussion about weight loss, diet, and exercise Discussed goal weight and initial weight loss goal (<170lb) Patient will work on limiting access to unhealthy snacking foods in the evening, have easily accessible "healthy" options  Bronchiectasis without complication ([place]) Monitor; continue guaifenacin  High risk falls/unsteady gait Has seen PT, continue reduce sedating medications, in process of workup with neuro, ortho follows  Polypharmacy Reviewed and discontinued several supplements Has  . Continue regular med reviews and d/c meds when possible.   Over 30 minutes of exam, counseling, chart review, and critical decision making was performed  Future Appointments  Date Time Provider [place]  07/09/2021 11:00 AM [name], NP [facility] None  04/08/2022 10:00 AM Unk Pinto, MD [facility] None    Plan:   During the course of the visit the patient was educated and counseled about appropriate screening and preventive services including:   Pneumococcal vaccine  Influenza vaccine Td vaccine Prevnar 13 Screening electrocardiogram Screening mammography Bone densitometry screening Colorectal cancer screening Diabetes screening Glaucoma screening Nutrition counseling  Advanced directives: given info/requested copies   Subjective:   Crystal [name] is a 71 y.o. female who presents for Medicare Annual Wellness Visit and 3 month OV for diabetes, htn, hyperlipidemia, CKD, vitamin D.  Patient has OSA & does not use her CPAP/BiPAP, she is aware of need, states will work on  compliance.   She has a Chronic Pain Syndrome consequent of multiple Neck & back surgeries & followed by [place] neuro surgical - tramadol, takes intermittently. She is on cymbalta  for choronic pain, wellbutrin for mood and focus. Reports some seasonal down days but overall improved since son passed back in 2012. She also takes gabapentin 300 mg TID (reduced dose, had confusion with higher dose).   She has frequent falls, impaired gait and complex ortho hx, patient was seen by [facility] neurologist Dr. Orvilla Fus and in process of workup with EMG/NCV and MRI.   On oxybutynin 5 mg BID due to OAB/ICS with benefit, hasn't been able to tolerate tapering per patient.   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   She has Crohn's managed by Dr. Earlean Shawl, now on humira without recent flares.   BMI is There is no height or weight on file to calculate BMI., she has been working on diet, Exercise is limited due to ongoing multiple orthopedic concerns has had multiple falls this past year, neuro and ortho following, has seen PT, doing exercises athome  Wt Readings from Last 3 Encounters:  05/09/21 133 lb 9.6 oz (60.6 kg)  04/08/21 132 lb 9.6 oz (60.1 kg)  03/19/21 133 lb 9.6 oz (60.6 kg)   Her blood pressure has been controlled at home, today their BP is   She does not workout, she has upcoming shoulder replacement, seeing specialist.   She denies chest pain, shortness of breath, dizziness.   She is on cholesterol medication (fenofibrate, lopid was increased to BID, hx of statin intolerance) and denies myalgias. Her cholesterol is not at goal of LDL <70. The cholesterol last visit was:   Lab Results  Component Value Date   CHOL 142 04/08/2021   HDL 41 (L) 04/08/2021   LDLCALC 76 04/08/2021   TRIG 155 (H) 04/08/2021   CHOLHDL 3.5 04/08/2021   She has been working on diet for lifestyle controlled diabetes (A1C 6.6%, 6.7% in 2016), and denies foot ulcerations,  increased appetite, nausea, paresthesia of the feet, polydipsia, polyuria, visual disturbances, vomiting and weight loss.  Last A1C in the office was:  Lab Results  Component Value Date   HGBA1C 5.5 04/08/2021   She has CKD II associated with lifestyle controlled diabetes, on olmesartan:  Lab Results  Component Value Date   GFRNONAA >60 01/19/2021   Patient is on Vitamin D supplement, at goal last visit.  Lab Results  Component Value Date   VD25OH 56 04/08/2021     She is on thyroid medication. Her medication was not changed last visit. Taking 100 mcg tab daily.   Lab Results  Component Value Date   TSH 1.95 04/08/2021  .     Medication Review Current Outpatient Medications on File Prior to Visit  Medication Sig   bisoprolol (ZEBETA) 5 MG tablet Take  1/2 to 1  tablet  every Morning  for BP   buPROPion (WELLBUTRIN XL) 150 MG 24 hr tablet TAKE 1 TABLET DAILY FOR MOOD, FOCUS & CONCENTRATION   calcium carbonate (OS-CAL) 1250 (500 Ca) MG chewable tablet Chew 1 tablet by mouth daily.   carboxymethylcellulose (REFRESH PLUS) 0.5 % SOLN Place 1 drop into both eyes 3 (three) times daily as needed (dry eyes).   Cholecalciferol 125 MCG (5000 UT) TABS Take 5,000 Units by mouth daily.   diclofenac Sodium (VOLTAREN) 1 % GEL Apply 4 g topically 4 (four) times daily. (Patient taking differently: Apply 4 g topically 4 (four) times daily as needed (pain).)   DULoxetine (CYMBALTA) 60 MG capsule Take 1 capsule Daily for Chronic Pain   fenofibrate micronized (LOFIBRA) 134  MG capsule Take  1 capsule  Daily  for Triglycerides (Blood Fats)   fluconazole (DIFLUCAN) 150 MG tablet Take 150 mg by mouth See admin instructions. Sundays and Thursday   fluocinonide gel (LIDEX) [id number] % Apply 1 application topically 2 (two) times daily as needed (infection).   fluticasone (FLONASE) 50 MCG/ACT nasal spray USE TWO SPRAYS IN EACH NOSTRIL DAILY AS NEEDED (Patient taking differently: Place 2 sprays into both nostrils  daily as needed for allergies.)   fluticasone (FLONASE) 50 MCG/ACT nasal spray USE TWO SPRAYS IN EACH NOSTRIL DAILY AS NEEDED   gabapentin (NEURONTIN) 300 MG capsule TAKE 1 CAPSULE BY MOUTH THREE TIMES A DAY   gabapentin (NEURONTIN) 600 MG tablet Take 1/2 to 1 tablet 3 to 4 x /Daily as needed for Pain   hydrocortisone 2.5 % ointment Apply 1 application topically daily as needed (itching).   Lactobacillus (PROBIOTIC ACIDOPHILUS PO) Take 1 tablet by mouth daily.   levothyroxine (SYNTHROID) 100 MCG tablet TAKE 1 TABLET BY MOUTH ON TUES, WED, THURS, SAT AND SUN. TAKE 1& 1/2 TABLET ON MON AND FRI   meclizine (ANTIVERT) 25 MG tablet TAKE 1 TABLET (25 MG TOTAL) BY MOUTH 3 (THREE) TIMES DAILY AS NEEDED FOR DIZZINESS.   neomycin-polymyxin-hydrocortisone (CORTISPORIN) 3.5-10000-1 OTIC suspension Instill  6 to 8 drops  to affected ear  3 to 4 x /day  and occlude with cotton   olmesartan (BENICAR) 20 MG tablet Take 1/2 to 1 tablet  at Night  for BP   oxybutynin (DITROPAN) 5 MG tablet TAKE 1 TABLET TWICE A DAY FOR BLADDER CONTROL   rOPINIRole (REQUIP) 2 MG tablet TAKE 1 TABLET FOUR TIMES A DAY FOR RESTLESS LEGS   tiZANidine (ZANAFLEX) 2 MG tablet TAKE 1/2 TO 1 TABLET EVERY 8 HOURS ONLY IF NEEDED FOR MUSCLE SPASMS   traMADol (ULTRAM) 50 MG tablet Take 50 mg by mouth every 6 (six) hours as needed for moderate pain.   No current facility-administered medications on file prior to visit.     Current Problems (verified) Patient Active Problem List   Diagnosis Date Noted   Aortic atherosclerosis ([place]) by Chest CT scan on 09/2017 07/05/2020   CKD stage 2 due to type 2 diabetes mellitus ([place]) 11/16/2019   COVID-19 virus infection 10/19/2019   Presence of artificial knee joint, right 09/20/2019   History of total knee arthroplasty, right 05/17/2019   Pain due to total right knee replacement ([place]) 05/17/2019   History of recurrent TIAs 11/15/2018   Rupture of popliteal cyst of right knee region (complex cyst)  11/03/2018   Diet-controlled type 2 diabetes mellitus ([place]) 07/04/2018   History of adenomatous polyp of colon 10/18/2017   Bronchiectasis without complication ([place]) [id number]   Diverticulosis 10/12/2017   Internal hemorrhoids 10/12/2017   Polypharmacy 04/09/2017   Arthritis of carpometacarpal (CMC) joints of both thumbs 02/12/2017   Cervical disc disease 01/23/2017   Constipation 01/23/2017   Crohn's ileitis ([place]) 01/23/2017   S/P revision of total knee, left 12/18/2016   B12 deficiency anemia 06/16/2016   Sleep talking 03/26/2016   IBS (irritable colon syndrome) 08/09/2015   Spinal stenosis in cervical region 06/14/2015   Other specified arthropathy involving shoulder region 11/23/2014   Obesity (BMI 30.0-34.9) 10/31/2014   Medication management 10/25/2013   Rotator cuff arthropathy 09/15/2013   Hyperlipidemia associated with type 2 diabetes mellitus ([place])    Hypothyroid    Depression, major, recurrent, in partial remission ([place])    Vitamin D deficiency    ASCVD  Gait disorder 10/26/2012   Knee pain 09/07/2012   Rotator cuff tear 08/23/2012   Bilateral bunions 11/19/2011   Rhinitis 06/25/2010   Obstructive sleep apnea 06/21/2010   RESTLESS LEG SYNDROME 06/21/2010   Essential hypertension 06/21/2010   GERD 06/21/2010   Crohn's Enterocolitis / Regional enteritis 06/21/2010   Fibromyalgia 06/21/2010    Screening Tests Immunization History  Administered Date(s) Administered   DT (Pediatric) 01/03/2015   Influenza Split 03/30/2010, 03/31/2011, 05/14/2014, 02/27/2016, 04/16/2018   Influenza Whole 03/30/2010   Influenza, High Dose Seasonal PF 03/13/2015, 07/21/2019, 02/16/2020, 04/08/2021   Influenza-Unspecified 03/31/2011, 05/14/2014, 02/27/2016   PFIZER Comirnaty(Gray Top)Covid-19 Tri-Sucrose Vaccine 09/19/2019, 10/10/2019, 07/13/2020   PFIZER(Purple Top)SARS-COV-2 Vaccination 09/19/2019, 10/10/2019   Pfizer Covid-19 Vaccine Bivalent Booster 32yrs & up 05/14/2021    Pneumococcal Conjugate-13 06/30/2005, 04/17/2015, 01/23/2019   Pneumococcal Polysaccharide-23 01/06/2017   Pneumococcal-Unspecified 06/30/2005   Td 06/30/2004   Td,absorbed, Preservative Free, Adult Use, Lf Unspecified 06/30/2004   Zoster Recombinat (Shingrix) 05/14/2021   Zoster, Live 07/01/2007    Preventative care: Last colonoscopy: Dr. Earlean Shawl, 09/2017 Last mammogram: 05/2019 has scheduled next week  DEXA: 03/2018 T -1.2 , follow up due 03/2020, order entered, patient has scheduled next week Sleep study 04/2016    Prior vaccinations: TD or Tdap: 2016 Influenza: 01/2020 - advised booster  Pneumococcal: 2018 Prevnar13: 2016 Shingles/Zostavax: 2009 covid 19: 2/2, 2021, advised booster  Names of Other Physician/Practitioners you currently use: 1. [facility]- here for primary care 2. Dr. Nori Riis ophthalmology, eye doctor, last visit 2021 3. Dr. Mariea Clonts, dentist, last visit 2021 q 6 months   Patient Care Team: Unk Pinto, MD as PCP - General (Internal Medicine) Star Age, MD as Attending Physician (Neurology) Rozetta Nunnery, MD (Inactive) as Consulting Physician (Otolaryngology) Richmond Campbell, MD as Consulting Physician (Gastroenterology) Erline Levine, MD as Consulting Physician (Neurosurgery) Katy Apo, MD as Consulting Physician (Ophthalmology) Crista Luria, MD as Consulting Physician (Dermatology) Vickey Huger, MD as Consulting Physician (Orthopedic Surgery) Valeta Harms, MD as Referring Physician (Orthopedic Surgery) Schulenklopper, Aart, PT as Physical Therapist (Physical Therapy)  Allergies Allergies  Allergen Reactions   Atorvastatin Other (See Comments)    Extreme pain   Codeine Other (See Comments)    Headache   Pravastatin Other (See Comments)    Exreme pain   Statins Other (See Comments)    Extreme pain   Amitriptyline Other (See Comments)    Unspecified   Carbidopa    Fish Oil  Nausea Only   Hydrocodone Other (See Comments)    Itching Other Reaction: Other reaction    Linseed Oil Other (See Comments)    Other Reaction: Other reaction   Nuvigil [Armodafinil] Other (See Comments)    Unspecified   Sulfa Antibiotics Other (See Comments)   Erythromycin Other (See Comments)    Reaction unspecified   Erythromycin Base Rash   Flax Seed [Bio-Flax] Rash    Linseed   Hydrocodone-Acetaminophen Rash   Morphine Nausea And Vomiting and Rash   Nsaids Nausea Only and Other (See Comments)    Other Reaction: Other reaction   Sertraline Rash   Sinemet [Carbidopa W-Levodopa] Rash   Sulfamethoxazole Rash   Tolmetin Nausea Only    SURGICAL HISTORY She  has a past surgical history that includes Shoulder adhesion release; Spine surgery; Appendectomy (1960); Cesarean section ([place]); Abdominal hysterectomy (1992); Rotator cuff repair (Left, 2005); Tonsillectomy (1960); Joint replacement; Toe Surgery; Toe Surgery; Anterior cervical decompression/discectomy fusion 4 level (N/A, 06/14/2015); ORIF femur  fracture (Left, 03/24/2017); and Knee surgery. FAMILY HISTORY Her family history includes Breast cancer in her mother; Cancer in her mother; Depression in her sister; Diabetes in her mother; Heart disease in her father; Hyperlipidemia in her father; Hypertension in her father; Leukemia in her sister; Lupus in her sister; Migraines in her daughter; Neuropathy in her father; Osteoporosis in her mother; Rheum arthritis in her sister. SOCIAL HISTORY She  reports that she has never smoked. She has never used smokeless tobacco. She reports current alcohol use. She reports that she does not use drugs.  MEDICARE WELLNESS OBJECTIVES: Physical activity:   Cardiac risk factors:   Depression/mood screen:   Depression screen [facility] 2/9 04/08/2021  Decreased Interest 0  Down, Depressed, Hopeless 0  PHQ - 2 Score 0  Altered sleeping -  Tired, decreased energy -  Change in appetite -   Feeling bad or failure about yourself  -  Trouble concentrating -  Moving slowly or fidgety/restless -  Suicidal thoughts -  PHQ-9 Score -  Difficult doing work/chores -  Some recent data might be hidden    ADLs:  In your present state of health, do you have any difficulty performing the following activities: 04/08/2021 03/19/2021  Hearing? N N  Vision? N N  Difficulty concentrating or making decisions? N N  Walking or climbing stairs? N N  Dressing or bathing? N N  Doing errands, shopping? N N  Some recent data might be hidden     Cognitive Testing  Alert? Yes  Normal Appearance?Yes  Oriented to person? Yes  Place? Yes   Time? Yes  Recall of three objects?  Yes  Can perform simple calculations? Yes  Displays appropriate judgment?Yes  Can read the correct time from a watch face?Yes  EOL planning:     Objective:   There were no vitals filed for this visit.  There is no height or weight on file to calculate BMI.  General Appearance: Well nourished, elderly female, with walker, in no acute distress. Eyes: PERRLA, EOMs, conjunctiva no swelling or erythema. Chronically drooping R eye lid.  Sinuses: No Frontal/maxillary tenderness ENT/Mouth: Ext aud canals clear, TMs without erythema, bulging. No erythema, swelling, or exudate on post pharynx.  Tonsils not swollen or erythematous. Hearing normal.  Neck: Supple, thyroid normal.  Respiratory: Respiratory effort normal, BS equal bilaterally without rales, rhonchi, wheezing or stridor.  Cardio: RRR with no MRGs. Brisk peripheral pulses without edema.  Abdomen: Soft, + BS.  Non tender, no guarding, rebound, hernias, masses. Lymphatics: Non tender without lymphadenopathy.  Musculoskeletal: No obvious or palpable bony abnormality, slow gait with walker, has chronic R knee effusion consistent with baseline, mildly warm to touch without erythema Skin: Warm, dry without rashes, lesions; she has ecchymosis surrounding R eye, cheek; mild  bruising to right inner breast without palpable abnormality, mild ecchymoses to left lower back/upper hip in linear pattern, no settling ecchymosis of flank area Neuro:  Normal muscle tone, Sensation intact.  Psych: Awake and oriented X 3, normal affect, Insight and Judgment appropriate.   Medicare Attestation I have personally reviewed: The patient's medical and social history Their use of alcohol, tobacco or illicit drugs Their current medications and supplements The patient's functional ability including ADLs,fall risks, home safety risks, cognitive, and hearing and visual impairment Diet and physical activities Evidence for depression or mood disorders  The patient's weight, height, BMI, and visual acuity have been recorded in the chart.  I have made referrals, counseling, and provided education to the  patient based on review of the above and I have provided the patient with a written personalized care plan for preventive services.     [name], NP   07/08/2021

## 2021-07-09 ENCOUNTER — Ambulatory Visit: Payer: Medicare PPO | Admitting: Nurse Practitioner

## 2021-07-15 ENCOUNTER — Encounter: Payer: Self-pay | Admitting: Internal Medicine

## 2021-07-15 DIAGNOSIS — M5416 Radiculopathy, lumbar region: Secondary | ICD-10-CM | POA: Diagnosis not present

## 2021-07-15 NOTE — Progress Notes (Signed)
MEDICARE ANNUAL WELLNESS VISIT AND FOLLOW UP  Assessment:    Medicare annual wellness visit, subsequent -due next year  Essential hypertension -well controlled currently -monitor at home -dash diet -exercise as tolerated -CBC  Overactive bladder Stop oxybutynin and start Myrbetriq 25mg  qd Samples given, if likes medication message and will send in a script   ASCVD (arteriosclerotic cardiovascular disease) -cont to maximize cholesterol management and BP management   Obstructive sleep apnea -emphasized adherence with CPAP    Allergic rhinitis, unspecified allergic rhinitis type -cont meds - suggested she rotate antihistamine q3-6 months  Lifestyle controlled diabetes ([place]) Discussed general issues about diabetes pathophysiology and management., Educational material distributed., Suggested low cholesterol diet., Encouraged aerobic exercise., Discussed foot care., Reminded to get yearly retinal exam.  - Hemoglobin A1c at routine OVs in office q43m   Hypothyroidism, unspecified hypothyroidism type -check TSH level, continue medications the same, reminded to take on an empty stomach 30-107mins before food.  - TSH   Hyperlipidemia associated with T2DM (HCC)/Statin Myopathy - statin intolerant, on fenofibrate and lopid with recent increased dose -continue medications, check lipids, decrease fatty foods, increase activity.  -  Check Lipid panel - Check CMP  CKD 2 associated with T2DM (HCC) Increase fluids, avoid NSAIDS, monitor sugars, will monitor  Vitamin D deficiency -cont supplement  Medication management - CBC, CMP/GFR  Gastroesophageal reflux disease, esophagitis presence not specified -cont meds  Crohn's disease with complication, unspecified gastrointestinal tract location [facility]) -followed by Dr. Earlean Shawl, improved with humira   IBS (irritable colon syndrome) -followed by Dr. Earlean Shawl  Fibromyalgia -followed by Dr. Maryjean Ka   RESTLESS LEG SYNDROME -followed by  Dr. Maryjean Ka, continue requip   Depression, remission partial, recurrent ([place]) -cont current meds   Cervical pain -followed by neurosurgery  Spinal stenosis in cervical region -followed by neurosurgery  Anemia due to vitamin B12 deficiency, unspecified B12 deficiency type Continue supplement  Insomnia, unspecified type Sleep hygiene discussed  Skin tear left upper arm without complication, initial encounter Area dressed with triple antibiotic ointment, telfa pad and coban Advised to keep area clean and dry and monitor for s/s of infection  Osteopenia - get dexa, continue Vit D and Ca, weight bearing exercises  BMI 24 Long discussion about weight loss, diet, and exercise Discussed goal weight and initial weight loss goal (<170lb) Patient will work on limiting access to unhealthy snacking foods in the evening, have easily accessible "healthy" options  Bronchiectasis without complication ([place]) Monitor; continue guaifenacin  High risk falls/unsteady gait Has seen PT, continue reduce sedating medications, in process of workup with neuro, ortho follows  Polypharmacy  Continue regular med reviews and d/c meds when possible.    Over 30 minutes of exam, counseling, chart review, and critical decision making was performed  Future Appointments  Date Time Provider [place]  04/08/2022 10:00 AM Unk Pinto, MD [facility] None  07/16/2022 11:00 AM [name], NP [facility] None    Plan:   During the course of the visit the patient was educated and counseled about appropriate screening and preventive services including:   Pneumococcal vaccine  Influenza vaccine Td vaccine Prevnar 13 Screening electrocardiogram Screening mammography Bone densitometry screening Colorectal cancer screening Diabetes screening Glaucoma screening Nutrition counseling  Advanced directives: given info/requested copies   Subjective:   Crystal [name] is a 71 y.o.  female who presents for Medicare Annual Wellness Visit and 3 month OV for diabetes, htn, hyperlipidemia, CKD, vitamin D.  Patient has OSA & does not use her CPAP/BiPAP,  she is aware of need, states will work on compliance.   She has a Chronic Pain Syndrome consequent of multiple Neck & back surgeries & followed by [place] [facility] - tramadol, takes intermittently. She is on cymbalta for choronic pain, wellbutrin for mood and focus. Reports some seasonal down days but overall improved since son passed back in 2012.  She has frequent falls, impaired gait and complex ortho hx, patient was seen by [facility] neurologist Dr. Sharmaine Base and in process of workup with EMG/NCV and MRI.   12/23/20 She had right knee surgery for hole in the knee cap.  Continues to have some pain in that knee.  She is going to have her left middle finger surgery has arthritis and pain in the finger.  On oxybutynin 5 mg BID due to OAB/ICS with benefit, hasn't been able to tolerate tapering per patient. She states she would like to switch to Myrbetriq.   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   She has Crohn's managed by Dr. Kinnie Scales, was on humira without recent flares. She is currently off Humira since she has surgery.  She is interested in getting bed rails as she has had episodes of falling out of bed.  BMI is Body mass index is 24.27 kg/m., she has been working on diet, Exercise is limited due to ongoing multiple orthopedic concerns has had multiple falls this past year, neuro and ortho following, has seen PT, doing exercises athome  Wt Readings from Last 3 Encounters:  07/16/21 137 lb (62.1 kg)  05/09/21 133 lb 9.6 oz (60.6 kg)  04/08/21 132 lb 9.6 oz (60.1 kg)   Her blood pressure has been controlled at home, today their BP is BP: 114/80 BP Readings from Last 3 Encounters:  07/16/21 114/80  05/09/21 134/87  04/08/21 128/80    She does not workout, she has upcoming  shoulder replacement, seeing specialist.   She denies chest pain, shortness of breath, dizziness.   She is on cholesterol medication  and denies myalgias. Her cholesterol is not at goal of LDL <70. The cholesterol last visit was:   Lab Results  Component Value Date   CHOL 142 04/08/2021   HDL 41 (L) 04/08/2021   LDLCALC 76 04/08/2021   TRIG 155 (H) 04/08/2021   CHOLHDL 3.5 04/08/2021   She has been working on diet for lifestyle controlled diabetes (A1C 6.6%, 6.7% in 2016), and denies foot ulcerations, increased appetite, nausea, paresthesia of the feet, polydipsia, polyuria, visual disturbances, vomiting and weight loss.  Last A1C in the office was:  Lab Results  Component Value Date   HGBA1C 5.5 04/08/2021   She has CKD II associated with lifestyle controlled diabetes, on olmesartan:  Lab Results  Component Value Date   GFRNONAA >60 01/19/2021   Patient is on Vitamin D supplement, at goal last visit.  Lab Results  Component Value Date   VD25OH 77 04/08/2021     She is on thyroid medication. Her medication was not changed last visit. Taking 100 mcg tab daily.   Lab Results  Component Value Date   TSH 1.95 04/08/2021  .     Medication Review Current Outpatient Medications on File Prior to Visit  Medication Sig   bisoprolol (ZEBETA) 5 MG tablet Take  1/2 to 1  tablet  every Morning  for BP   buPROPion (WELLBUTRIN XL) 150 MG 24 hr tablet TAKE 1 TABLET DAILY FOR MOOD, FOCUS & CONCENTRATION  calcium carbonate (OS-CAL) 1250 (500 Ca) MG chewable tablet Chew 1 tablet by mouth daily.   carboxymethylcellulose (REFRESH PLUS) 0.5 % SOLN Place 1 drop into both eyes 3 (three) times daily as needed (dry eyes).   Cholecalciferol 125 MCG (5000 UT) TABS Take 5,000 Units by mouth daily.   diclofenac Sodium (VOLTAREN) 1 % GEL Apply 4 g topically 4 (four) times daily. (Patient taking differently: Apply 4 g topically 4 (four) times daily as needed (pain).)   DULoxetine (CYMBALTA) 60 MG  capsule Take 1 capsule Daily for Chronic Pain   fluticasone (FLONASE) 50 MCG/ACT nasal spray USE TWO SPRAYS IN EACH NOSTRIL DAILY AS NEEDED (Patient taking differently: Place 2 sprays into both nostrils daily as needed for allergies.)   fluticasone (FLONASE) 50 MCG/ACT nasal spray USE TWO SPRAYS IN EACH NOSTRIL DAILY AS NEEDED   gabapentin (NEURONTIN) 300 MG capsule TAKE 1 CAPSULE BY MOUTH THREE TIMES A DAY   hydrocortisone 2.5 % ointment Apply 1 application topically daily as needed (itching).   Lactobacillus (PROBIOTIC ACIDOPHILUS PO) Take 1 tablet by mouth daily.   levothyroxine (SYNTHROID) 100 MCG tablet TAKE 1 TABLET BY MOUTH ON TUES, WED, THURS, SAT AND SUN. TAKE 1& 1/2 TABLET ON MON AND FRI   meclizine (ANTIVERT) 25 MG tablet TAKE 1 TABLET (25 MG TOTAL) BY MOUTH 3 (THREE) TIMES DAILY AS NEEDED FOR DIZZINESS.   neomycin-polymyxin-hydrocortisone (CORTISPORIN) 3.5-10000-1 OTIC suspension Instill  6 to 8 drops  to affected ear  3 to 4 x /day  and occlude with cotton   olmesartan (BENICAR) 20 MG tablet Take 1/2 to 1 tablet  at Night  for BP   oxybutynin (DITROPAN) 5 MG tablet TAKE 1 TABLET TWICE A DAY FOR BLADDER CONTROL   rOPINIRole (REQUIP) 2 MG tablet TAKE 1 TABLET FOUR TIMES A DAY FOR RESTLESS LEGS   tiZANidine (ZANAFLEX) 2 MG tablet TAKE 1/2 TO 1 TABLET EVERY 8 HOURS ONLY IF NEEDED FOR MUSCLE SPASMS   traMADol (ULTRAM) 50 MG tablet Take 50 mg by mouth every 6 (six) hours as needed for moderate pain.   fluocinonide gel (LIDEX) [id number] % Apply 1 application topically 2 (two) times daily as needed (infection).   No current facility-administered medications on file prior to visit.     Current Problems (verified) Patient Active Problem List   Diagnosis Date Noted   Aortic atherosclerosis ([place]) by Chest CT scan on 09/2017 07/05/2020   CKD stage 2 due to type 2 diabetes mellitus ([place]) 11/16/2019   COVID-19 virus infection 10/19/2019   Presence of artificial knee joint, right 09/20/2019    History of total knee arthroplasty, right 05/17/2019   Pain due to total right knee replacement ([place]) 05/17/2019   History of recurrent TIAs 11/15/2018   Rupture of popliteal cyst of right knee region (complex cyst) 11/03/2018   Diet-controlled type 2 diabetes mellitus ([place]) 07/04/2018   History of adenomatous polyp of colon 10/18/2017   Bronchiectasis without complication ([place]) [id number]   Diverticulosis 10/12/2017   Internal hemorrhoids 10/12/2017   Polypharmacy 04/09/2017   Arthritis of carpometacarpal (CMC) joints of both thumbs 02/12/2017   Cervical disc disease 01/23/2017   Constipation 01/23/2017   Crohn's ileitis ([place]) 01/23/2017   S/P revision of total knee, left 12/18/2016   B12 deficiency anemia 06/16/2016   Sleep talking 03/26/2016   IBS (irritable colon syndrome) 08/09/2015   Spinal stenosis in cervical region 06/14/2015   Other specified arthropathy involving shoulder region 11/23/2014   Obesity (BMI 30.0-34.9) 10/31/2014  Medication management 10/25/2013   Rotator cuff arthropathy 09/15/2013   Hyperlipidemia associated with type 2 diabetes mellitus ([place])    Hypothyroid    Depression, major, recurrent, in partial remission ([place])    Vitamin D deficiency    ASCVD    Gait disorder 10/26/2012   Knee pain 09/07/2012   Rotator cuff tear 08/23/2012   Bilateral bunions 11/19/2011   Rhinitis 06/25/2010   Obstructive sleep apnea 06/21/2010   RESTLESS LEG SYNDROME 06/21/2010   Essential hypertension 06/21/2010   GERD 06/21/2010   Crohn's Enterocolitis / Regional enteritis 06/21/2010   Fibromyalgia 06/21/2010    Screening Tests Immunization History  Administered Date(s) Administered   DT (Pediatric) 01/03/2015   Influenza Split 03/30/2010, 03/31/2011, 05/14/2014, 02/27/2016, 04/16/2018   Influenza Whole 03/30/2010   Influenza, High Dose Seasonal PF 03/13/2015, 07/21/2019, 02/16/2020, 04/08/2021   Influenza-Unspecified 03/31/2011, 05/14/2014, 02/27/2016   PFIZER  Comirnaty(Gray Top)Covid-19 Tri-Sucrose Vaccine 09/19/2019, 10/10/2019, 07/13/2020   PFIZER(Purple Top)SARS-COV-2 Vaccination 09/19/2019, 10/10/2019   Pfizer Covid-19 Vaccine Bivalent Booster 47yrs & up 05/14/2021   Pneumococcal Conjugate-13 06/30/2005, 04/17/2015, 01/23/2019   Pneumococcal Polysaccharide-23 01/06/2017   Pneumococcal-Unspecified 06/30/2005   Td 06/30/2004   Td,absorbed, Preservative Free, Adult Use, Lf Unspecified 06/30/2004   Zoster Recombinat (Shingrix) 05/14/2021   Zoster, Live 07/01/2007   Health Maintenance  Topic Date Due   Zoster Vaccines- Shingrix (2 of 2) 10/14/2021 (Originally 07/09/2021)   OPHTHALMOLOGY EXAM  09/25/2021   HEMOGLOBIN A1C  10/07/2021   FOOT EXAM  04/07/2022   MAMMOGRAM  07/09/2022   TETANUS/TDAP  01/02/2025   COLONOSCOPY (Pts 45-72yrs Insurance coverage will need to be confirmed)  10/14/2027   Pneumonia Vaccine 104+ Years old  Completed   INFLUENZA VACCINE  Completed   DEXA SCAN  Completed   COVID-19 Vaccine  Completed   Hepatitis C Screening  Completed   HPV VACCINES  Aged Out     Names of Other Physician/Practitioners you currently use: 1. [facility]- here for primary care 2. Dr. Nori Riis ophthalmology, eye doctor, last visit 2022 3. Dr. Mariea Clonts, dentist, last visit 2022 q 6 months   Patient Care Team: Unk Pinto, MD as PCP - General (Internal Medicine) Star Age, MD as Attending Physician (Neurology) Rozetta Nunnery, MD (Inactive) as Consulting Physician (Otolaryngology) Richmond Campbell, MD as Consulting Physician (Gastroenterology) Erline Levine, MD as Consulting Physician (Neurosurgery) Katy Apo, MD as Consulting Physician (Ophthalmology) Crista Luria, MD as Consulting Physician (Dermatology) Vickey Huger, MD as Consulting Physician (Orthopedic Surgery) Valeta Harms, MD as Referring Physician (Orthopedic Surgery) Schulenklopper, Aart, PT as Physical  Therapist (Physical Therapy)  Allergies Allergies  Allergen Reactions   Atorvastatin Other (See Comments)    Extreme pain   Codeine Other (See Comments)    Headache   Pravastatin Other (See Comments)    Exreme pain   Statins Other (See Comments)    Extreme pain   Amitriptyline Other (See Comments)    Unspecified   Carbidopa    Fish Oil Nausea Only   Hydrocodone Other (See Comments)    Itching Other Reaction: Other reaction    Linseed Oil Other (See Comments)    Other Reaction: Other reaction   Nuvigil [Armodafinil] Other (See Comments)    Unspecified   Sulfa Antibiotics Other (See Comments)   Erythromycin Other (See Comments)    Reaction unspecified   Erythromycin Base Rash   Flax Seed [Bio-Flax] Rash    Linseed   Hydrocodone-Acetaminophen Rash   Morphine Nausea And Vomiting and Rash  Nsaids Nausea Only and Other (See Comments)    Other Reaction: Other reaction   Sertraline Rash   Sinemet [Carbidopa W-Levodopa] Rash   Sulfamethoxazole Rash   Tolmetin Nausea Only    SURGICAL HISTORY She  has a past surgical history that includes Shoulder adhesion release; Spine surgery; Appendectomy (1960); Cesarean section ([place]); Abdominal hysterectomy (1992); Rotator cuff repair (Left, 2005); Tonsillectomy (1960); Joint replacement; Toe Surgery; Toe Surgery; Anterior cervical decompression/discectomy fusion 4 level (N/A, 06/14/2015); ORIF femur fracture (Left, 03/24/2017); and Knee surgery. FAMILY HISTORY Her family history includes Breast cancer in her mother; Cancer in her mother; Depression in her sister; Diabetes in her mother; Heart disease in her father; Hyperlipidemia in her father; Hypertension in her father; Leukemia in her sister; Lupus in her sister; Migraines in her daughter; Neuropathy in her father; Osteoporosis in her mother; Rheum arthritis in her sister. SOCIAL HISTORY She  reports that she has never smoked. She has never used smokeless tobacco. She reports  current alcohol use. She reports that she does not use drugs.  MEDICARE WELLNESS OBJECTIVES: Physical activity: Current Exercise Habits: Home exercise routine, Type of exercise: walking, Time (Minutes): 15, Frequency (Times/Week): 2, Weekly Exercise (Minutes/Week): 30, Intensity: Mild, Exercise limited by: orthopedic condition(s) Cardiac risk factors: Cardiac Risk Factors include: advanced age (>94men, >15 women);dyslipidemia;hypertension;sedentary lifestyle Depression/mood screen:   Depression screen [facility] 2/9 07/16/2021  Decreased Interest 0  Down, Depressed, Hopeless 0  PHQ - 2 Score 0  Altered sleeping -  Tired, decreased energy -  Change in appetite -  Feeling bad or failure about yourself  -  Trouble concentrating -  Moving slowly or fidgety/restless -  Suicidal thoughts -  PHQ-9 Score -  Difficult doing work/chores -  Some recent data might be hidden    ADLs:  In your present state of health, do you have any difficulty performing the following activities: 07/16/2021 04/08/2021  Hearing? N N  Vision? N N  Difficulty concentrating or making decisions? N N  Walking or climbing stairs? Y N  Dressing or bathing? N N  Doing errands, shopping? Y N  Some recent data might be hidden     Cognitive Testing  Alert? Yes  Normal Appearance?Yes  Oriented to person? Yes  Place? Yes   Time? Yes  Recall of three objects?  Yes  Can perform simple calculations? Yes  Displays appropriate judgment?Yes  Can read the correct time from a watch face?Yes  EOL planning: Does Patient Have a Medical Advance Directive?: Yes Type of Advance Directive: Healthcare Power of Attorney, Living will Does patient want to make changes to medical advance directive?: No - Patient declined Copy of [place] in Chart?: No - copy requested   Objective:   Today's Vitals   07/16/21 1005  BP: 114/80  Pulse: (!) 106  Temp: (!) 97.3 F (36.3 C)  SpO2: 99%  Weight: 137 lb (62.1 kg)     Body mass index is 24.27 kg/m.  General Appearance: Well nourished, elderly female, with walker, in no acute distress. Eyes: PERRLA, EOMs, conjunctiva no swelling or erythema. Chronically drooping R eye lid.  Sinuses: No Frontal/maxillary tenderness ENT/Mouth: Ext aud canals clear, TMs without erythema, bulging. No erythema, swelling, or exudate on post pharynx.  Tonsils not swollen or erythematous. Hearing normal.  Neck: Supple, thyroid normal.  Respiratory: Respiratory effort normal, BS equal bilaterally without rales, rhonchi, wheezing or stridor.  Cardio: RRR with no MRGs. Brisk peripheral pulses without edema.  Abdomen: Soft, +  BS.  Non tender, no guarding, rebound, hernias, masses. Lymphatics: Non tender without lymphadenopathy.  Musculoskeletal: No obvious or palpable bony abnormality, slow gait with walker, has chronic R knee pain Skin: Warm, dry . She has a 4-5 cm skin tear on left upper arm- antibiotic ointment applied, telfa and coban dressing Neuro:  Normal muscle tone, Sensation intact.  Psych: Awake and oriented X 3, normal affect, Insight and Judgment appropriate.   Medicare Attestation I have personally reviewed: The patient's medical and social history Their use of alcohol, tobacco or illicit drugs Their current medications and supplements The patient's functional ability including ADLs,fall risks, home safety risks, cognitive, and hearing and visual impairment Diet and physical activities Evidence for depression or mood disorders  The patient's weight, height, BMI, and visual acuity have been recorded in the chart.  I have made referrals, counseling, and provided education to the patient based on review of the above and I have provided the patient with a written personalized care plan for preventive services.     [name], NP   07/16/2021

## 2021-07-16 ENCOUNTER — Other Ambulatory Visit: Payer: Self-pay

## 2021-07-16 ENCOUNTER — Ambulatory Visit: Payer: Medicare PPO | Admitting: Nurse Practitioner

## 2021-07-16 ENCOUNTER — Encounter: Payer: Self-pay | Admitting: Nurse Practitioner

## 2021-07-16 VITALS — BP 114/80 | HR 106 | Temp 97.3°F | Wt 137.0 lb

## 2021-07-16 DIAGNOSIS — N182 Chronic kidney disease, stage 2 (mild): Secondary | ICD-10-CM | POA: Diagnosis not present

## 2021-07-16 DIAGNOSIS — M4802 Spinal stenosis, cervical region: Secondary | ICD-10-CM

## 2021-07-16 DIAGNOSIS — K5000 Crohn's disease of small intestine without complications: Secondary | ICD-10-CM

## 2021-07-16 DIAGNOSIS — E1169 Type 2 diabetes mellitus with other specified complication: Secondary | ICD-10-CM

## 2021-07-16 DIAGNOSIS — R6889 Other general symptoms and signs: Secondary | ICD-10-CM

## 2021-07-16 DIAGNOSIS — N3281 Overactive bladder: Secondary | ICD-10-CM

## 2021-07-16 DIAGNOSIS — I700 Atherosclerosis of aorta: Secondary | ICD-10-CM

## 2021-07-16 DIAGNOSIS — T466X5A Adverse effect of antihyperlipidemic and antiarteriosclerotic drugs, initial encounter: Secondary | ICD-10-CM

## 2021-07-16 DIAGNOSIS — M797 Fibromyalgia: Secondary | ICD-10-CM

## 2021-07-16 DIAGNOSIS — Z9181 History of falling: Secondary | ICD-10-CM

## 2021-07-16 DIAGNOSIS — Z0001 Encounter for general adult medical examination with abnormal findings: Secondary | ICD-10-CM | POA: Diagnosis not present

## 2021-07-16 DIAGNOSIS — G720 Drug-induced myopathy: Secondary | ICD-10-CM

## 2021-07-16 DIAGNOSIS — F3341 Major depressive disorder, recurrent, in partial remission: Secondary | ICD-10-CM | POA: Diagnosis not present

## 2021-07-16 DIAGNOSIS — R2681 Unsteadiness on feet: Secondary | ICD-10-CM

## 2021-07-16 DIAGNOSIS — M5090 Cervical disc disorder, unspecified, unspecified cervical region: Secondary | ICD-10-CM

## 2021-07-16 DIAGNOSIS — E039 Hypothyroidism, unspecified: Secondary | ICD-10-CM

## 2021-07-16 DIAGNOSIS — E1122 Type 2 diabetes mellitus with diabetic chronic kidney disease: Secondary | ICD-10-CM | POA: Diagnosis not present

## 2021-07-16 DIAGNOSIS — G4700 Insomnia, unspecified: Secondary | ICD-10-CM

## 2021-07-16 DIAGNOSIS — Z780 Asymptomatic menopausal state: Secondary | ICD-10-CM

## 2021-07-16 DIAGNOSIS — I10 Essential (primary) hypertension: Secondary | ICD-10-CM | POA: Diagnosis not present

## 2021-07-16 DIAGNOSIS — J479 Bronchiectasis, uncomplicated: Secondary | ICD-10-CM | POA: Diagnosis not present

## 2021-07-16 DIAGNOSIS — Z0000 Encounter for general adult medical examination without abnormal findings: Secondary | ICD-10-CM

## 2021-07-16 DIAGNOSIS — S41112A Laceration without foreign body of left upper arm, initial encounter: Secondary | ICD-10-CM

## 2021-07-16 DIAGNOSIS — K219 Gastro-esophageal reflux disease without esophagitis: Secondary | ICD-10-CM

## 2021-07-16 DIAGNOSIS — E559 Vitamin D deficiency, unspecified: Secondary | ICD-10-CM

## 2021-07-16 DIAGNOSIS — D513 Other dietary vitamin B12 deficiency anemia: Secondary | ICD-10-CM

## 2021-07-16 DIAGNOSIS — K589 Irritable bowel syndrome without diarrhea: Secondary | ICD-10-CM

## 2021-07-16 DIAGNOSIS — Z79899 Other long term (current) drug therapy: Secondary | ICD-10-CM

## 2021-07-16 DIAGNOSIS — M8580 Other specified disorders of bone density and structure, unspecified site: Secondary | ICD-10-CM

## 2021-07-16 DIAGNOSIS — E785 Hyperlipidemia, unspecified: Secondary | ICD-10-CM | POA: Diagnosis not present

## 2021-07-16 DIAGNOSIS — E663 Overweight: Secondary | ICD-10-CM

## 2021-07-16 LAB — CBC WITH DIFFERENTIAL/PLATELET
Absolute Monocytes: 1032 cells/uL — ABNORMAL HIGH (ref 200–950)
Basophils Absolute: 13 cells/uL (ref 0–200)
Basophils Relative: 0.1 %
Eosinophils Absolute: 0 cells/uL — ABNORMAL LOW (ref 15–500)
Eosinophils Relative: 0 %
HCT: 46.2 % — ABNORMAL HIGH (ref 35.0–45.0)
Hemoglobin: 15.2 g/dL (ref 11.7–15.5)
Lymphs Abs: 1005 cells/uL (ref 850–3900)
MCH: 30.2 pg (ref 27.0–33.0)
MCHC: 32.9 g/dL (ref 32.0–36.0)
MCV: 91.8 fL (ref 80.0–100.0)
MPV: 9.6 fL (ref 7.5–12.5)
Monocytes Relative: 7.7 %
Neutro Abs: 11350 cells/uL — ABNORMAL HIGH (ref 1500–7800)
Neutrophils Relative %: 84.7 %
Platelets: 351 10*3/uL (ref 140–400)
RBC: 5.03 10*6/uL (ref 3.80–5.10)
RDW: 12.4 % (ref 11.0–15.0)
Total Lymphocyte: 7.5 %
WBC: 13.4 10*3/uL — ABNORMAL HIGH (ref 3.8–10.8)

## 2021-07-16 LAB — COMPLETE METABOLIC PANEL WITH GFR
AG Ratio: 1.7 (calc) (ref 1.0–2.5)
ALT: 14 U/L (ref 6–29)
AST: 17 U/L (ref 10–35)
Albumin: 4.5 g/dL (ref 3.6–5.1)
Alkaline phosphatase (APISO): 73 U/L (ref 37–153)
BUN: 21 mg/dL (ref 7–25)
CO2: 33 mmol/L — ABNORMAL HIGH (ref 20–32)
Calcium: 10.3 mg/dL (ref 8.6–10.4)
Chloride: 103 mmol/L (ref 98–110)
Creat: 0.71 mg/dL (ref 0.60–1.00)
Globulin: 2.7 g/dL (calc) (ref 1.9–3.7)
Glucose, Bld: 91 mg/dL (ref 65–99)
Potassium: 4.1 mmol/L (ref 3.5–5.3)
Sodium: 143 mmol/L (ref 135–146)
Total Bilirubin: 0.4 mg/dL (ref 0.2–1.2)
Total Protein: 7.2 g/dL (ref 6.1–8.1)
eGFR: 90 mL/min/{1.73_m2} (ref >=60)

## 2021-07-16 LAB — LIPID PANEL
Cholesterol: 271 mg/dL — ABNORMAL HIGH (ref <200)
HDL: 57 mg/dL (ref >=50)
LDL Cholesterol (Calc): 173 mg/dL (calc) — ABNORMAL HIGH
Non-HDL Cholesterol (Calc): 214 mg/dL (calc) — ABNORMAL HIGH (ref <130)
Total CHOL/HDL Ratio: 4.8 (calc) (ref <5.0)
Triglycerides: 250 mg/dL — ABNORMAL HIGH (ref <150)

## 2021-07-16 LAB — HEMOGLOBIN A1C
Hgb A1c MFr Bld: 5.7 % of total Hgb — ABNORMAL HIGH (ref <5.7)
Mean Plasma Glucose: 117 mg/dL
eAG (mmol/L): 6.5 mmol/L

## 2021-07-16 LAB — VITAMIN B12: Vitamin B-12: 1025 pg/mL (ref 200–1100)

## 2021-07-16 LAB — TSH: TSH: 0.12 mIU/L — ABNORMAL LOW (ref 0.40–4.50)

## 2021-07-18 ENCOUNTER — Other Ambulatory Visit: Payer: Self-pay

## 2021-07-18 ENCOUNTER — Other Ambulatory Visit: Payer: Self-pay | Admitting: Internal Medicine

## 2021-07-18 ENCOUNTER — Encounter: Payer: Self-pay | Admitting: Nurse Practitioner

## 2021-07-18 ENCOUNTER — Other Ambulatory Visit: Payer: Self-pay | Admitting: Nurse Practitioner

## 2021-07-18 DIAGNOSIS — E039 Hypothyroidism, unspecified: Secondary | ICD-10-CM

## 2021-07-18 DIAGNOSIS — J309 Allergic rhinitis, unspecified: Secondary | ICD-10-CM

## 2021-07-18 DIAGNOSIS — F3341 Major depressive disorder, recurrent, in partial remission: Secondary | ICD-10-CM

## 2021-07-18 DIAGNOSIS — E785 Hyperlipidemia, unspecified: Secondary | ICD-10-CM

## 2021-07-18 DIAGNOSIS — E1169 Type 2 diabetes mellitus with other specified complication: Secondary | ICD-10-CM

## 2021-07-18 MED ORDER — BUPROPION HCL ER (XL) 300 MG PO TB24: ORAL_TABLET | ORAL | 3 refills | Status: DC

## 2021-07-18 MED ORDER — LEVOTHYROXINE SODIUM 88 MCG PO TABS: ORAL_TABLET | ORAL | 3 refills | Status: DC

## 2021-07-18 MED ORDER — EZETIMIBE 10 MG PO TABS: 10.0000 mg | ORAL_TABLET | Freq: Every day | ORAL | 11 refills | Status: DC

## 2021-07-18 MED ORDER — FLUTICASONE PROPIONATE 50 MCG/ACT NA SUSP: NASAL | 3 refills | Status: DC

## 2021-07-20 ENCOUNTER — Other Ambulatory Visit: Payer: Self-pay | Admitting: Internal Medicine

## 2021-07-20 ENCOUNTER — Encounter: Payer: Self-pay | Admitting: Internal Medicine

## 2021-07-20 DIAGNOSIS — E039 Hypothyroidism, unspecified: Secondary | ICD-10-CM

## 2021-07-20 MED ORDER — LEVOTHYROXINE SODIUM 88 MCG PO TABS: ORAL_TABLET | ORAL | 3 refills | Status: DC

## 2021-07-23 ENCOUNTER — Other Ambulatory Visit: Payer: Self-pay | Admitting: Nurse Practitioner

## 2021-07-23 ENCOUNTER — Telehealth: Payer: Self-pay

## 2021-07-23 DIAGNOSIS — N3010 Interstitial cystitis (chronic) without hematuria: Secondary | ICD-10-CM

## 2021-07-23 DIAGNOSIS — M19049 Primary osteoarthritis, unspecified hand: Secondary | ICD-10-CM | POA: Diagnosis not present

## 2021-07-23 DIAGNOSIS — N3281 Overactive bladder: Secondary | ICD-10-CM

## 2021-07-23 DIAGNOSIS — M19042 Primary osteoarthritis, left hand: Secondary | ICD-10-CM | POA: Diagnosis not present

## 2021-07-23 DIAGNOSIS — M180 Bilateral primary osteoarthritis of first carpometacarpal joints: Secondary | ICD-10-CM | POA: Diagnosis not present

## 2021-07-23 DIAGNOSIS — G5601 Carpal tunnel syndrome, right upper limb: Secondary | ICD-10-CM | POA: Diagnosis not present

## 2021-07-23 MED ORDER — MIRABEGRON ER 25 MG PO TB24: 25.0000 mg | ORAL_TABLET | Freq: Every day | ORAL | 3 refills | Status: DC

## 2021-07-23 NOTE — Telephone Encounter (Signed)
I placed a referral to urology- overactive bladder and interstitial cystitis

## 2021-07-23 NOTE — Telephone Encounter (Signed)
Patient is going to go back on Oxybutnin and is willing to go to Urology.  She has seen Dr. Vonita Moss before.

## 2021-07-24 DIAGNOSIS — M19042 Primary osteoarthritis, left hand: Secondary | ICD-10-CM | POA: Diagnosis not present

## 2021-07-29 DIAGNOSIS — M19049 Primary osteoarthritis, unspecified hand: Secondary | ICD-10-CM | POA: Diagnosis not present

## 2021-07-29 DIAGNOSIS — Z981 Arthrodesis status: Secondary | ICD-10-CM | POA: Diagnosis not present

## 2021-07-29 DIAGNOSIS — M180 Bilateral primary osteoarthritis of first carpometacarpal joints: Secondary | ICD-10-CM | POA: Diagnosis not present

## 2021-07-29 DIAGNOSIS — M25642 Stiffness of left hand, not elsewhere classified: Secondary | ICD-10-CM | POA: Diagnosis not present

## 2021-07-31 DIAGNOSIS — M5416 Radiculopathy, lumbar region: Secondary | ICD-10-CM | POA: Diagnosis not present

## 2021-07-31 DIAGNOSIS — R030 Elevated blood-pressure reading, without diagnosis of hypertension: Secondary | ICD-10-CM | POA: Diagnosis not present

## 2021-07-31 NOTE — Progress Notes (Signed)
Assessment and Plan:  Crystal [name] was seen today for acute visit.  Diagnoses and all orders for this visit:  Frontal sinusitis, unspecified chronicity -     dexamethasone (DECADRON) 1 MG tablet; Take 3 tabs for 1 days, 2 tabs for 1 days 1 tab for 3 days. Take with food. -     azithromycin (ZITHROMAX) 250 MG tablet; Take 2 tablets (500 mg) on  Day 1,  followed by 1 tablet (250 mg) once daily on Days 2 through 5. Continue Mucinex and Flonase as needed Push fluids If symptoms do not resolve or numbness in cheek persists/worsens please notify the office       Further disposition pending results of labs. Discussed med's effects and SE's.   Over 20 minutes of exam, counseling, chart review, and critical decision making was performed.   Future Appointments  Date Time Provider Department Center  10/21/2021 11:00 AM Revonda Humphrey, NP [facility] None  04/08/2022 10:00 AM [name], MD [facility] None  07/16/2022 11:00 AM Revonda Humphrey, NP [facility] None    ------------------------------------------------------------------------------------------------------------------   HPI BP 112/70    Pulse 90    Temp (!) 97.2 F (36.2 C)    Wt 137 lb 12.8 oz (62.5 kg)    SpO2 99%    BMI 24.41 kg/m   71 y.o.female presents for sinus headache which has been present for 1 week.  Left sided frontal sinus pain and left ear pain along with fatigue.  Denies body aches, cough, nausea, vomiting and diarrhea. She is using flonase and Mucinex which has provided little relief.  She has also noted some numbness in the left cheek which has been present for 4-6 weeks.    Past Medical History:  Diagnosis Date   Arthritis    ASCVD (arteriosclerotic cardiovascular disease)    Closed nondisplaced subtrochanteric fracture of left femur (HCC) 09/03/2017   September 2018.  Status post surgery by Dr. Carola Frost   Depression    Fibromyalgia    Gait disorder 10/26/2012   GERD (gastroesophageal reflux disease)    GI bleed     Hyperlipidemia    Hypertension    Hypothyroid    Periprosthetic supracondylar fracture of femur, left  03/23/2017   Restless leg syndrome    Rhinitis 06/25/2010   Sleep apnea    TIA (transient ischemic attack)    3         Vitamin D deficiency      Allergies  Allergen Reactions   Atorvastatin Other (See Comments)    Extreme pain   Codeine Other (See Comments)    Headache   Pravastatin Other (See Comments)    Exreme pain   Statins Other (See Comments)    Extreme pain   Amitriptyline Other (See Comments)    Unspecified   Carbidopa    Fish Oil Nausea Only   Hydrocodone Other (See Comments)    Itching Other Reaction: Other reaction    Linseed Oil Other (See Comments)    Other Reaction: Other reaction   Nuvigil [Armodafinil] Other (See Comments)    Unspecified   Sulfa Antibiotics Other (See Comments)   Erythromycin Other (See Comments)    Reaction unspecified   Erythromycin Base Rash   Flax Seed [Bio-Flax] Rash    Linseed   Hydrocodone-Acetaminophen Rash   Morphine Nausea And Vomiting and Rash   Nsaids Nausea Only and Other (See Comments)    Other Reaction: Other reaction   Sertraline Rash   Sinemet [Carbidopa W-Levodopa] Rash  Sulfamethoxazole Rash   Tolmetin Nausea Only    Current Outpatient Medications on File Prior to Visit  Medication Sig   bisoprolol (ZEBETA) 5 MG tablet Take  1/2 to 1  tablet  every Morning  for BP   buPROPion (WELLBUTRIN XL) 300 MG 24 hr tablet Take  1 tablet  Daily  for Mood, Focus &  Concentration   calcium carbonate (OS-CAL) 1250 (500 Ca) MG chewable tablet Chew 1 tablet by mouth daily.   carboxymethylcellulose (REFRESH PLUS) 0.5 % SOLN Place 1 drop into both eyes 3 (three) times daily as needed (dry eyes).   Cholecalciferol 125 MCG (5000 UT) TABS Take 5,000 Units by mouth daily.   diclofenac Sodium (VOLTAREN) 1 % GEL Apply 4 g topically 4 (four) times daily. (Patient taking differently: Apply 4 g topically 4 (four) times daily as  needed (pain).)   DULoxetine (CYMBALTA) 60 MG capsule Take 1 capsule Daily for Chronic Pain   fluocinonide gel (LIDEX) 0.05 % Apply 1 application topically 2 (two) times daily as needed (infection).   fluticasone (FLONASE) 50 MCG/ACT nasal spray USE TWO SPRAYS IN EACH NOSTRIL DAILY AS NEEDED (Patient taking differently: Place 2 sprays into both nostrils daily as needed for allergies.)   fluticasone (FLONASE) 50 MCG/ACT nasal spray USE TWO SPRAYS IN EACH NOSTRIL DAILY AS NEEDED   gabapentin (NEURONTIN) 300 MG capsule TAKE 1 CAPSULE BY MOUTH THREE TIMES A DAY   hydrocortisone 2.5 % ointment Apply 1 application topically daily as needed (itching).   Lactobacillus (PROBIOTIC ACIDOPHILUS PO) Take 1 tablet by mouth daily.   levothyroxine (SYNTHROID) 88 MCG tablet Take  1 tablet  Daily  on an empty stomach with only water for 30 minutes & no Antacid meds, Calcium or Magnesium for 4 hours & avoid Biotin   meclizine (ANTIVERT) 25 MG tablet TAKE 1 TABLET (25 MG TOTAL) BY MOUTH 3 (THREE) TIMES DAILY AS NEEDED FOR DIZZINESS.   neomycin-polymyxin-hydrocortisone (CORTISPORIN) 3.5-10000-1 OTIC suspension Instill  6 to 8 drops  to affected ear  3 to 4 x /day  and occlude with cotton   olmesartan (BENICAR) 20 MG tablet Take 1/2 to 1 tablet  at Night  for BP   oxybutynin (DITROPAN) 5 MG tablet TAKE 1 TABLET TWICE A DAY FOR BLADDER CONTROL   rOPINIRole (REQUIP) 2 MG tablet TAKE 1 TABLET FOUR TIMES A DAY FOR RESTLESS LEGS   tiZANidine (ZANAFLEX) 2 MG tablet TAKE 1/2 TO 1 TABLET EVERY 8 HOURS ONLY IF NEEDED FOR MUSCLE SPASMS   traMADol (ULTRAM) 50 MG tablet Take 50 mg by mouth every 6 (six) hours as needed for moderate pain.   ezetimibe (ZETIA) 10 MG tablet Take 1 tablet (10 mg total) by mouth daily. (Patient not taking: Reported on 08/01/2021)   mirabegron ER (MYRBETRIQ) 25 MG TB24 tablet Take 1 tablet (25 mg total) by mouth daily.   No current facility-administered medications on file prior to visit.    ROS: all  negative except above.   Physical Exam:  BP 112/70    Pulse 90    Temp (!) 97.2 F (36.2 C)    Wt 137 lb 12.8 oz (62.5 kg)    SpO2 99%    BMI 24.41 kg/m   General Appearance: Well nourished, elderly female in no apparent distress. Eyes: PERRLA, EOMs, conjunctiva no swelling or erythema Sinuses: Positive left frontal tenderness ENT/Mouth: Ext aud canals clear, L TM erythema and dull, fluid noted.  R TM no erythema or bulging. No erythema, swelling,  or exudate on post pharynx.  Tonsils not swollen or erythematous. Hearing normal.  Neck: Supple, thyroid normal.  Respiratory: Respiratory effort normal, BS equal bilaterally without rales, rhonchi, wheezing or stridor.  Cardio: RRR with no MRGs. Brisk peripheral pulses without edema.  Abdomen: Soft, + BS.  Non tender, no guarding, rebound, hernias, masses. Lymphatics: Positive left cervical adenopathy, nontender Musculoskeletal: Full ROM, 5/5 strength, normal gait.  Skin: Warm, dry without rashes, lesions, ecchymosis.  Neuro: Cranial nerves intact. Normal muscle tone, no cerebellar symptoms. Sensation intact.  Psych: Awake and oriented X 3, normal affect, Insight and Judgment appropriate.     Revonda Humphrey, NP 2:18 PM [facility]

## 2021-08-01 ENCOUNTER — Other Ambulatory Visit: Payer: Self-pay

## 2021-08-01 ENCOUNTER — Ambulatory Visit: Payer: Medicare PPO | Admitting: Nurse Practitioner

## 2021-08-01 ENCOUNTER — Encounter: Payer: Self-pay | Admitting: Nurse Practitioner

## 2021-08-01 VITALS — BP 112/70 | HR 90 | Temp 97.2°F | Wt 137.8 lb

## 2021-08-01 DIAGNOSIS — M26623 Arthralgia of bilateral temporomandibular joint: Secondary | ICD-10-CM | POA: Diagnosis not present

## 2021-08-01 DIAGNOSIS — J321 Chronic frontal sinusitis: Secondary | ICD-10-CM | POA: Diagnosis not present

## 2021-08-01 MED ORDER — DEXAMETHASONE 1 MG PO TABS: ORAL_TABLET | ORAL | 0 refills | Status: DC

## 2021-08-01 MED ORDER — AZITHROMYCIN 250 MG PO TABS: ORAL_TABLET | ORAL | 1 refills | Status: DC

## 2021-08-05 ENCOUNTER — Telehealth: Payer: Self-pay | Admitting: Neurology

## 2021-08-05 NOTE — Telephone Encounter (Signed)
Pt states she has not used her CPAP since this past Spring due to her  Fibrro Myalgia. Pt would like to know if she is a candidate for Inspire, please call.

## 2021-08-05 NOTE — Telephone Encounter (Signed)
We did a sleep study in 2017 which did not show any significant obstructive sleep apnea.  I had recommended that she follow-up with pulmonology.  Please advise patient to ask primary care to send her to pulmonology for evaluation and treatment as we discussed in 2017.

## 2021-08-07 ENCOUNTER — Other Ambulatory Visit: Payer: Self-pay | Admitting: Neurological Surgery

## 2021-08-07 DIAGNOSIS — Z471 Aftercare following joint replacement surgery: Secondary | ICD-10-CM | POA: Diagnosis not present

## 2021-08-07 DIAGNOSIS — T8484XD Pain due to internal orthopedic prosthetic devices, implants and grafts, subsequent encounter: Secondary | ICD-10-CM | POA: Diagnosis not present

## 2021-08-07 DIAGNOSIS — Z96651 Presence of right artificial knee joint: Secondary | ICD-10-CM | POA: Diagnosis not present

## 2021-08-08 ENCOUNTER — Telehealth: Payer: Self-pay

## 2021-08-08 NOTE — Telephone Encounter (Signed)
Saw you for a sinus infection and now has developed a cough. Wants to know what she should take.

## 2021-08-08 NOTE — Telephone Encounter (Signed)
Advise patient to use Mucinex DM

## 2021-08-09 ENCOUNTER — Telehealth: Payer: Self-pay | Admitting: Neurology

## 2021-08-09 NOTE — Telephone Encounter (Signed)
Pt called wanting to discuss the Inspire. She states she spoke to the company and that they informed her that she qualifies for the [place].

## 2021-08-12 NOTE — Telephone Encounter (Signed)
I called pt.  She is interested in inspire.  I relayed that per Dr. Frances Furbish that she had in 2017 Sleep study did not show OSA.  She was referred back to pulmonary which she states that she did go and they did sleep study and was placed on cpap.  I relayed that Dr Frances Furbish did see her previous message, some how it was missed from our work que (apologized).  She will call pulmonary and speak to them about inspire.  They will then refer her based on their recommendations.  Pt was ok with this appreciated call back.  She is seeing [facility] for her neck/back issues.

## 2021-08-12 NOTE — Telephone Encounter (Addendum)
August 05, 2021     11:41 AM Star Age, MD routed this conversation to [facility] [employer]  Star Age, MD     11:41 AM Note We did a sleep study in 2017 which did not show any significant obstructive sleep apnea.  I had recommended that she follow-up with pulmonology.  Please advise patient to ask primary care to send her to pulmonology for evaluation and treatment as we discussed in 2017.      Message to Star Age, MD     11:21 AM Please advise. She saw you several years ago for Bipap, the data at that time was only through 08/06/14. She saw you for gait abnormality in 2021. Ok to schedule appt does she need a whole new sleep referral from primary care?

## 2021-08-14 ENCOUNTER — Other Ambulatory Visit: Payer: Self-pay | Admitting: Neurological Surgery

## 2021-08-15 DIAGNOSIS — M48061 Spinal stenosis, lumbar region without neurogenic claudication: Secondary | ICD-10-CM | POA: Diagnosis not present

## 2021-08-15 DIAGNOSIS — M5416 Radiculopathy, lumbar region: Secondary | ICD-10-CM | POA: Diagnosis not present

## 2021-08-16 DIAGNOSIS — L439 Lichen planus, unspecified: Secondary | ICD-10-CM | POA: Diagnosis not present

## 2021-08-16 DIAGNOSIS — B370 Candidal stomatitis: Secondary | ICD-10-CM | POA: Diagnosis not present

## 2021-08-16 DIAGNOSIS — L905 Scar conditions and fibrosis of skin: Secondary | ICD-10-CM | POA: Diagnosis not present

## 2021-08-16 DIAGNOSIS — B379 Candidiasis, unspecified: Secondary | ICD-10-CM | POA: Diagnosis not present

## 2021-08-19 ENCOUNTER — Other Ambulatory Visit: Payer: Self-pay | Admitting: Adult Health

## 2021-08-19 DIAGNOSIS — J40 Bronchitis, not specified as acute or chronic: Secondary | ICD-10-CM

## 2021-08-19 DIAGNOSIS — M19049 Primary osteoarthritis, unspecified hand: Secondary | ICD-10-CM | POA: Diagnosis not present

## 2021-08-19 DIAGNOSIS — M25642 Stiffness of left hand, not elsewhere classified: Secondary | ICD-10-CM | POA: Diagnosis not present

## 2021-08-20 ENCOUNTER — Encounter: Payer: Self-pay | Admitting: Pulmonary Disease

## 2021-08-20 ENCOUNTER — Ambulatory Visit: Payer: Medicare PPO | Admitting: Pulmonary Disease

## 2021-08-20 ENCOUNTER — Other Ambulatory Visit: Payer: Self-pay

## 2021-08-20 VITALS — BP 110/70 | HR 116 | Temp 98.1°F | Ht 63.0 in | Wt 140.6 lb

## 2021-08-20 DIAGNOSIS — J479 Bronchiectasis, uncomplicated: Secondary | ICD-10-CM | POA: Diagnosis not present

## 2021-08-20 DIAGNOSIS — R634 Abnormal weight loss: Secondary | ICD-10-CM | POA: Diagnosis not present

## 2021-08-20 DIAGNOSIS — Z8669 Personal history of other diseases of the nervous system and sense organs: Secondary | ICD-10-CM

## 2021-08-20 NOTE — Progress Notes (Signed)
[facility]  Chief Complaint  Patient presents with   Follow-up    She wants to talk about inspire device,. She does not feel that she tolerated CPAP well. She has not used since 05/22.     Past Surgical History:  She  has a past surgical history that includes Shoulder adhesion release; Spine surgery; Appendectomy (1960); Cesarean section ([place]); Abdominal hysterectomy (1992); Rotator cuff repair (Left, 2005); Tonsillectomy (1960); Joint replacement; Toe Surgery; Toe Surgery; Anterior cervical decompression/discectomy fusion 4 level (N/A, 06/14/2015); ORIF femur fracture (Left, 03/24/2017); and Knee surgery.  Past Medical History:  Vit D deficiency, TIA, RLS, Hypothyroidism, HTN, HLD, GERD, Fibromyalgia, Depression, CAD, Crohn's disease, Bell's palsy  Constitutional:  BP 110/70 (BP Location: Left Arm, Patient Position: Sitting, Cuff Size: Normal)    Pulse (!) 116    Temp 98.1 F (36.7 C) (Oral)    Ht 5' 3"  (1.6 m)    Wt 140 lb 9.6 oz (63.8 kg)    SpO2 95%    BMI 24.91 kg/m   Brief Summary:  Crystal [name] is a 71 y.o. female with obstructive sleep apnea, and bronchiectasis in the setting of reflux.      Subjective:   Since I last saw her she has lost about 35 lbs.  It became difficult for her to wear CPAP.  She stopped using about 1 year ago.  Sleeping okay.  Has cough recently after sinus infection.  Benzonatate helps.  Not having chest pain, wheeze, or sputum.  Physical Exam:   Appearance - well kempt   ENMT - no sinus tenderness, no oral exudate, no LAN, Mallampati 2 airway, no stridor, right facial droop  Respiratory - equal breath sounds bilaterally, no wheezing or rales  CV - s1s2 regular rate and rhythm, no murmurs  Ext - no clubbing, no edema  Skin - no rashes  Psych - normal mood and affect   Pulmonary testing:  PFT 11/21/15 >> FEV1 2.51 (110%), FEV1% 85%, TLC 4.36 (88%), DLCO 77%  Chest Imaging:   CT chest 10/01/17 >> 3 mm LLL nodule, mild cylindrical BTX Rt side  Sleep Tests:  HST 12/04/15 >> 30.7, SpO2 low 73%  Cardiac Tests:  Echo 05/16/13 >> EF 50 to 55%, mild MR  Social History:  She  reports that she has never smoked. She has never used smokeless tobacco. She reports current alcohol use. She reports that she does not use drugs.  Family History:  Her family history includes Breast cancer in her mother; Cancer in her mother; Depression in her sister; Diabetes in her mother; Heart disease in her father; Hyperlipidemia in her father; Hypertension in her father; Leukemia in her sister; Lupus in her sister; Migraines in her daughter; Neuropathy in her father; Osteoporosis in her mother; Rheum arthritis in her sister.     Assessment/Plan:   Obstructive sleep apnea. - she has lost significant weight and sleeping better - will arrange for home sleep study to assess status of sleep apnea; can be done after she recovers from upcoming back surgery   Nasal congestion with narrow nasal angles and alar collapse. - previously followed by Dr. Lucia Gaskins with ENT   Regional bronchiectasis in setting of reflux. - prn antitussive and expectorants  Reflux. - followed by Dr. Earlean Shawl with gastroenterology  Time Spent Involved in Patient Care on Day of Examination:  26 minutes  Follow up:   Patient Instructions  Will arrange for home sleep study Will  call to arrange for follow up after sleep study reviewed   Medication List:   Allergies as of 08/20/2021       Reactions   Atorvastatin Other (See Comments)   Extreme pain   Codeine Other (See Comments)   Headache   Pravastatin Other (See Comments)   Exreme pain   Statins Other (See Comments)   Extreme pain   Amitriptyline Other (See Comments)   Unspecified   Carbidopa    Erythromycin Other (See Comments)   Reaction unspecified   Erythromycin Base Rash   Fish Oil Nausea Only   Flax Seed [bio-flax] Rash   Linseed    Hydrocodone Other (See Comments)   Itching Other Reaction: Other reaction   Hydrocodone-acetaminophen Rash   Linseed Oil Other (See Comments)   Other Reaction: Other reaction   Morphine Nausea And Vomiting, Rash   Nsaids Nausea Only, Other (See Comments)   Other Reaction: Other reaction   Nuvigil [armodafinil] Other (See Comments)   Unspecified   Sertraline Rash   Sinemet [carbidopa W-levodopa] Rash   Sulfa Antibiotics Other (See Comments)   Sulfamethoxazole Rash   Tolmetin Nausea Only        Medication List        Accurate as of August 20, 2021  9:48 AM. If you have any questions, ask your nurse or doctor.          STOP taking these medications    amoxicillin 500 MG capsule Commonly known as: AMOXIL Stopped by: [name], MD   azithromycin 250 MG tablet Commonly known as: Zithromax Stopped by: [name], MD   buprenorphine 5 MCG/HR Ptwk Commonly known as: BUTRANS Stopped by: [name], MD   fluocinonide gel 0.05 % Commonly known as: LIDEX Stopped by: [name], MD       TAKE these medications    benzonatate 200 MG capsule Commonly known as: TESSALON Take by mouth.   bisoprolol 5 MG tablet Commonly known as: ZEBETA Take  1/2 to 1  tablet  every Morning  for BP   buPROPion 300 MG 24 hr tablet Commonly known as: WELLBUTRIN XL Take  1 tablet  Daily  for Mood, Focus &  Concentration   calcium carbonate 1250 (500 Ca) MG chewable tablet Commonly known as: OS-CAL Chew 1 tablet by mouth daily.   carboxymethylcellulose 0.5 % Soln Commonly known as: REFRESH PLUS Place 1 drop into both eyes 3 (three) times daily as needed (dry eyes).   Cholecalciferol 125 MCG (5000 UT) Tabs Take 5,000 Units by mouth daily.   dexamethasone 0.5 MG/5ML solution Commonly known as: DECADRON Take 0.5 mg by mouth daily. What changed: Another medication with the same name was removed. Continue taking this medication, and follow the directions you see  here. Changed by: [name], MD   DULoxetine 60 MG capsule Commonly known as: CYMBALTA Take 1 capsule Daily for Chronic Pain   FLUoxetine 40 MG capsule Commonly known as: PROZAC Take 40 mg by mouth daily.   fluticasone 50 MCG/ACT nasal spray Commonly known as: FLONASE USE TWO SPRAYS IN EACH NOSTRIL DAILY AS NEEDED What changed:  how much to take how to take this when to take this reasons to take this additional instructions   fluticasone 50 MCG/ACT nasal spray Commonly known as: FLONASE USE TWO SPRAYS IN EACH NOSTRIL DAILY AS NEEDED What changed: Another medication with the same name was changed. Make sure you understand how and when to take each.   gabapentin 300 MG capsule  Commonly known as: NEURONTIN TAKE 1 CAPSULE BY MOUTH THREE TIMES A DAY   hydrocortisone 2.5 % ointment Apply 1 application topically daily as needed (itching).   levothyroxine 88 MCG tablet Commonly known as: Synthroid Take  1 tablet  Daily  on an empty stomach with only water for 30 minutes & no Antacid meds, Calcium or Magnesium for 4 hours & avoid Biotin   meclizine 25 MG tablet Commonly known as: ANTIVERT TAKE 1 TABLET (25 MG TOTAL) BY MOUTH 3 (THREE) TIMES DAILY AS NEEDED FOR DIZZINESS.   metroNIDAZOLE 250 MG tablet Commonly known as: FLAGYL Take 250 mg by mouth 3 (three) times daily.   Myrbetriq 25 MG Tb24 tablet Generic drug: mirabegron ER Take 25 mg by mouth daily.   neomycin-polymyxin-hydrocortisone 3.5-10000-1 OTIC suspension Commonly known as: CORTISPORIN Instill  6 to 8 drops  to affected ear  3 to 4 x /day  and occlude with cotton   olmesartan 20 MG tablet Commonly known as: BENICAR Take 1/2 to 1 tablet  at Night  for BP   oxybutynin 5 MG tablet Commonly known as: DITROPAN TAKE 1 TABLET TWICE A DAY FOR BLADDER CONTROL   PROBIOTIC ACIDOPHILUS PO Take 1 tablet by mouth daily.   rOPINIRole 2 MG tablet Commonly known as: REQUIP TAKE 1 TABLET FOUR TIMES A DAY FOR  RESTLESS LEGS   tiZANidine 2 MG tablet Commonly known as: ZANAFLEX TAKE 1/2 TO 1 TABLET EVERY 8 HOURS ONLY IF NEEDED FOR MUSCLE SPASMS   traMADol 50 MG tablet Commonly known as: ULTRAM Take 50 mg by mouth every 6 (six) hours as needed for moderate pain.        Signature:  [name], MD [place] Pager - [phone] 08/20/2021, 9:48 AM

## 2021-08-20 NOTE — Patient Instructions (Signed)
Will arrange for home sleep study Will call to arrange for follow up after sleep study reviewed  

## 2021-08-21 ENCOUNTER — Other Ambulatory Visit: Payer: Self-pay | Admitting: Internal Medicine

## 2021-08-21 ENCOUNTER — Other Ambulatory Visit: Payer: Self-pay | Admitting: Adult Health

## 2021-08-21 ENCOUNTER — Encounter: Payer: Self-pay | Admitting: Adult Health

## 2021-08-21 DIAGNOSIS — I10 Essential (primary) hypertension: Secondary | ICD-10-CM

## 2021-08-22 ENCOUNTER — Other Ambulatory Visit: Payer: Self-pay | Admitting: Adult Health

## 2021-08-22 DIAGNOSIS — Z1231 Encounter for screening mammogram for malignant neoplasm of breast: Secondary | ICD-10-CM | POA: Diagnosis not present

## 2021-08-22 DIAGNOSIS — R0989 Other specified symptoms and signs involving the circulatory and respiratory systems: Secondary | ICD-10-CM

## 2021-08-22 LAB — HM MAMMOGRAPHY

## 2021-08-22 MED ORDER — BISOPROLOL FUMARATE 5 MG PO TABS
ORAL_TABLET | ORAL | 3 refills | Status: DC
Start: 2021-08-22 — End: 2022-02-11

## 2021-08-22 MED ORDER — OLMESARTAN MEDOXOMIL 20 MG PO TABS: ORAL_TABLET | ORAL | 3 refills | Status: DC

## 2021-08-26 DIAGNOSIS — M19041 Primary osteoarthritis, right hand: Secondary | ICD-10-CM | POA: Diagnosis not present

## 2021-08-27 ENCOUNTER — Encounter: Payer: Self-pay | Admitting: Internal Medicine

## 2021-09-02 NOTE — Progress Notes (Signed)
Surgical Instructions ? ? ? Your procedure is scheduled on Thursday, March 9th, 2023. ? ? Report to [facility] Main Entrance "A" at 05:30 A.M., then check in with the Admitting office. ? Call this number if you have problems the morning of surgery: ? [phone] ? ? If you have any questions prior to your surgery date call [phone]: Open Monday-Friday 8am-4pm ? ? ? Remember: ? Do not eat or drink after midnight the night before your surgery ?  ? Take these medicines the morning of surgery with A SIP OF WATER:  ? ?benzonatate (TESSALON)  ?bisoprolol (ZEBETA)  ?buPROPion (WELLBUTRIN XL)  ?dexamethasone (DECADRON)  ?DULoxetine (CYMBALTA)  ?FLUoxetine (PROZAC)  ?gabapentin (NEURONTIN)  ?levothyroxine (SYNTHROID)  ?metroNIDAZOLE (FLAGYL)  ?mirabegron ER (MYRBETRIQ) ?oxybutynin (DITROPAN) ?rOPINIRole (REQUIP)  ? ?If needed: ? ?carboxymethylcellulose (REFRESH PLUS) ?fluticasone (FLONASE) ?meclizine (ANTIVERT)  ?tiZANidine (ZANAFLEX)  ?traMADol Veatrice Bourbon)  ? ?As of today, STOP taking any Aspirin (unless otherwise instructed by your surgeon) Aleve, Naproxen, Ibuprofen, Motrin, Advil, Goody's, BC's, all herbal medications, fish oil, and all vitamins. ? ? ?The day of surgery: ?         ?Do not wear jewelry or makeup ?Do not wear lotions, powders, perfumes, or deodorant. ?Do not shave 48 hours prior to surgery.   ?Do not bring valuables to the hospital. ?Do not wear nail polish, gel polish, artificial nails, or any other type of covering on natural nails (fingers and toes) ?If you have artificial nails or gel coating that need to be removed by a nail salon, please have this removed prior to surgery. Artificial nails or gel coating may interfere with anesthesia's ability to adequately monitor your vital signs. ? ? ?[facility] is not responsible for any belongings or valuables. .  ? ?Do NOT Smoke (Tobacco/Vaping)  24 hours prior to your procedure ? ?If you use a CPAP at night, you may bring your mask for your overnight  stay. ?  ?Contacts, glasses, hearing aids, dentures or partials may not be worn into surgery, please bring cases for these belongings ?  ?For patients admitted to the hospital, discharge time will be determined by your treatment team. ?  ?Patients discharged the day of surgery will not be allowed to drive home, and someone needs to stay with them for 24 hours. ? ?NO VISITORS WILL BE ALLOWED IN PRE-OP WHERE PATIENTS ARE PREPPED FOR SURGERY.  ONLY 1 SUPPORT PERSON MAY BE PRESENT IN THE WAITING ROOM WHILE YOU ARE IN SURGERY.  IF YOU ARE TO BE ADMITTED, ONCE YOU ARE IN YOUR ROOM YOU WILL BE ALLOWED TWO (2) VISITORS. 1 (ONE) VISITOR MAY STAY OVERNIGHT BUT MUST ARRIVE TO THE ROOM BY 8pm.  Minor children may have two parents present. Special consideration for safety and communication needs will be reviewed on a case by case basis. ? ?Special instructions:   ? ?Oral Hygiene is also important to reduce your risk of infection.  Remember - BRUSH YOUR TEETH THE MORNING OF SURGERY WITH YOUR REGULAR TOOTHPASTE ? ? ?[facility]- Preparing For Surgery ? ?Before surgery, you can play an important role. Because skin is not sterile, your skin needs to be as free of germs as possible. You can reduce the number of germs on your skin by washing with CHG (chlorahexidine gluconate) Soap before surgery.  CHG is an antiseptic cleaner which kills germs and bonds with the skin to continue killing germs even after washing.   ? ? ?Please do not use if you have an allergy  to CHG or antibacterial soaps. If your skin becomes reddened/irritated stop using the CHG.  ?Do not shave (including legs and underarms) for at least 48 hours prior to first CHG shower. It is OK to shave your face. ? ?Please follow these instructions carefully. ?  ? ? Shower the NIGHT BEFORE SURGERY and the MORNING OF SURGERY with CHG Soap.  ? If you chose to wash your hair, wash your hair first as usual with your normal shampoo. After you shampoo, rinse your hair and body  thoroughly to remove the shampoo.  Then [employer] and genitals (private parts) with your normal soap and rinse thoroughly to remove soap. ? ?After that Use CHG Soap as you would any other liquid soap. You can apply CHG directly to the skin and wash gently with a scrungie or a clean washcloth.  ? ?Apply the CHG Soap to your body ONLY FROM THE NECK DOWN.  Do not use on open wounds or open sores. Avoid contact with your eyes, ears, mouth and genitals (private parts). Wash Face and genitals (private parts)  with your normal soap.  ? ?Wash thoroughly, paying special attention to the area where your surgery will be performed. ? ?Thoroughly rinse your body with warm water from the neck down. ? ?DO NOT shower/wash with your normal soap after using and rinsing off the CHG Soap. ? ?Pat yourself dry with a CLEAN TOWEL. ? ?Wear CLEAN PAJAMAS to bed the night before surgery ? ?Place CLEAN SHEETS on your bed the night before your surgery ? ?DO NOT SLEEP WITH PETS. ? ? ?Day of Surgery: ? ?Take a shower with CHG soap. ?Wear Clean/Comfortable clothing the morning of surgery ?Do not apply any deodorants/lotions.   ?Remember to brush your teeth WITH YOUR REGULAR TOOTHPASTE. ? ? ? ?COVID testing ? ?If you are going to stay overnight or be admitted after your procedure/surgery and require a pre-op COVID test, please follow these instructions after your COVID test  ? ?You are not required to quarantine however you are required to wear a well-fitting mask when you are out and around people not in your household.  If your mask becomes wet or soiled, replace with a new one. ? ?Wash your hands often with soap and water for 20 seconds or clean your hands with an alcohol-based hand sanitizer that contains at least 60% alcohol. ? ?Do not share personal items. ? ?Notify your provider: ?if you are in close contact with someone who has COVID  ?or if you develop a fever of 100.4 or greater, sneezing, cough, sore throat, shortness of breath or body  aches. ? ?  ?Please read over the following fact sheets that you were given.   ?

## 2021-09-03 ENCOUNTER — Other Ambulatory Visit: Payer: Self-pay

## 2021-09-03 ENCOUNTER — Encounter (HOSPITAL_COMMUNITY)
Admission: RE | Admit: 2021-09-03 | Discharge: 2021-09-03 | Disposition: A | Discharge status: Home / Self Care | Payer: Medicare PPO | Source: Ambulatory Visit | Attending: Neurological Surgery | Admitting: Neurological Surgery

## 2021-09-03 ENCOUNTER — Encounter (HOSPITAL_COMMUNITY): Payer: Self-pay

## 2021-09-03 VITALS — BP 134/86 | HR 91 | Temp 98.4°F | Resp 18 | Ht 63.5 in | Wt 138.3 lb

## 2021-09-03 DIAGNOSIS — J479 Bronchiectasis, uncomplicated: Secondary | ICD-10-CM | POA: Insufficient documentation

## 2021-09-03 DIAGNOSIS — G2581 Restless legs syndrome: Secondary | ICD-10-CM | POA: Diagnosis present

## 2021-09-03 DIAGNOSIS — G4733 Obstructive sleep apnea (adult) (pediatric): Secondary | ICD-10-CM | POA: Insufficient documentation

## 2021-09-03 DIAGNOSIS — E039 Hypothyroidism, unspecified: Secondary | ICD-10-CM | POA: Diagnosis present

## 2021-09-03 DIAGNOSIS — Z881 Allergy status to other antibiotic agents status: Secondary | ICD-10-CM | POA: Diagnosis not present

## 2021-09-03 DIAGNOSIS — M48061 Spinal stenosis, lumbar region without neurogenic claudication: Secondary | ICD-10-CM | POA: Diagnosis not present

## 2021-09-03 DIAGNOSIS — Z8673 Personal history of transient ischemic attack (TIA), and cerebral infarction without residual deficits: Secondary | ICD-10-CM | POA: Insufficient documentation

## 2021-09-03 DIAGNOSIS — G4730 Sleep apnea, unspecified: Secondary | ICD-10-CM | POA: Diagnosis present

## 2021-09-03 DIAGNOSIS — Z8261 Family history of arthritis: Secondary | ICD-10-CM | POA: Diagnosis not present

## 2021-09-03 DIAGNOSIS — Z79890 Hormone replacement therapy (postmenopausal): Secondary | ICD-10-CM | POA: Diagnosis not present

## 2021-09-03 DIAGNOSIS — Z01812 Encounter for preprocedural laboratory examination: Secondary | ICD-10-CM | POA: Insufficient documentation

## 2021-09-03 DIAGNOSIS — I2510 Atherosclerotic heart disease of native coronary artery without angina pectoris: Secondary | ICD-10-CM | POA: Insufficient documentation

## 2021-09-03 DIAGNOSIS — M4326 Fusion of spine, lumbar region: Secondary | ICD-10-CM | POA: Diagnosis not present

## 2021-09-03 DIAGNOSIS — Z888 Allergy status to other drugs, medicaments and biological substances status: Secondary | ICD-10-CM | POA: Diagnosis not present

## 2021-09-03 DIAGNOSIS — I700 Atherosclerosis of aorta: Secondary | ICD-10-CM | POA: Diagnosis present

## 2021-09-03 DIAGNOSIS — M48062 Spinal stenosis, lumbar region with neurogenic claudication: Secondary | ICD-10-CM | POA: Diagnosis present

## 2021-09-03 DIAGNOSIS — E559 Vitamin D deficiency, unspecified: Secondary | ICD-10-CM | POA: Diagnosis present

## 2021-09-03 DIAGNOSIS — Z886 Allergy status to analgesic agent status: Secondary | ICD-10-CM | POA: Diagnosis not present

## 2021-09-03 DIAGNOSIS — F32A Depression, unspecified: Secondary | ICD-10-CM | POA: Diagnosis present

## 2021-09-03 DIAGNOSIS — Z79899 Other long term (current) drug therapy: Secondary | ICD-10-CM | POA: Diagnosis not present

## 2021-09-03 DIAGNOSIS — Z8262 Family history of osteoporosis: Secondary | ICD-10-CM | POA: Diagnosis not present

## 2021-09-03 DIAGNOSIS — I10 Essential (primary) hypertension: Secondary | ICD-10-CM | POA: Diagnosis present

## 2021-09-03 DIAGNOSIS — Z01818 Encounter for other preprocedural examination: Secondary | ICD-10-CM

## 2021-09-03 DIAGNOSIS — Z885 Allergy status to narcotic agent status: Secondary | ICD-10-CM | POA: Diagnosis not present

## 2021-09-03 DIAGNOSIS — M797 Fibromyalgia: Secondary | ICD-10-CM | POA: Diagnosis present

## 2021-09-03 DIAGNOSIS — M1990 Unspecified osteoarthritis, unspecified site: Secondary | ICD-10-CM | POA: Diagnosis present

## 2021-09-03 DIAGNOSIS — Z20822 Contact with and (suspected) exposure to covid-19: Secondary | ICD-10-CM | POA: Insufficient documentation

## 2021-09-03 DIAGNOSIS — Z981 Arthrodesis status: Secondary | ICD-10-CM | POA: Diagnosis not present

## 2021-09-03 DIAGNOSIS — M5116 Intervertebral disc disorders with radiculopathy, lumbar region: Secondary | ICD-10-CM | POA: Diagnosis not present

## 2021-09-03 DIAGNOSIS — E785 Hyperlipidemia, unspecified: Secondary | ICD-10-CM | POA: Diagnosis present

## 2021-09-03 DIAGNOSIS — K219 Gastro-esophageal reflux disease without esophagitis: Secondary | ICD-10-CM | POA: Diagnosis present

## 2021-09-03 DIAGNOSIS — Z882 Allergy status to sulfonamides status: Secondary | ICD-10-CM | POA: Diagnosis not present

## 2021-09-03 DIAGNOSIS — G510 Bell's palsy: Secondary | ICD-10-CM | POA: Insufficient documentation

## 2021-09-03 DIAGNOSIS — M5416 Radiculopathy, lumbar region: Secondary | ICD-10-CM | POA: Diagnosis not present

## 2021-09-03 HISTORY — DX: Other reaction to spinal and lumbar puncture: G97.1

## 2021-09-03 HISTORY — DX: Pneumonia, unspecified organism: J18.9

## 2021-09-03 HISTORY — DX: Dyspnea, unspecified: R06.00

## 2021-09-03 HISTORY — DX: Prediabetes: R73.03

## 2021-09-03 LAB — BASIC METABOLIC PANEL
Anion gap: 7 (ref 5–15)
BUN: 22 mg/dL (ref 8–23)
CO2: 30 mmol/L (ref 22–32)
Calcium: 9.3 mg/dL (ref 8.9–10.3)
Chloride: 103 mmol/L (ref 98–111)
Creatinine, Ser: 0.65 mg/dL (ref 0.44–1.00)
GFR, Estimated: >60 mL/min (ref >60)
Glucose, Bld: 122 mg/dL — ABNORMAL HIGH (ref 70–99)
Potassium: 4.2 mmol/L (ref 3.5–5.1)
Sodium: 140 mmol/L (ref 135–145)

## 2021-09-03 LAB — TYPE AND SCREEN
ABO/RH(D): A NEG
Antibody Screen: NEGATIVE

## 2021-09-03 LAB — CBC
HCT: 44.9 % (ref 36.0–46.0)
Hemoglobin: 14.6 g/dL (ref 12.0–15.0)
MCH: 30.1 pg (ref 26.0–34.0)
MCHC: 32.5 g/dL (ref 30.0–36.0)
MCV: 92.6 fL (ref 80.0–100.0)
Platelets: 266 10*3/uL (ref 150–400)
RBC: 4.85 MIL/uL (ref 3.87–5.11)
RDW: 13.2 % (ref 11.5–15.5)
WBC: 7.3 10*3/uL (ref 4.0–10.5)
nRBC: 0 % (ref 0.0–0.2)

## 2021-09-03 LAB — SURGICAL PCR SCREEN
MRSA, PCR: NEGATIVE
Staphylococcus aureus: NEGATIVE

## 2021-09-03 LAB — SARS CORONAVIRUS 2 (TAT 6-24 HRS): SARS Coronavirus 2: NEGATIVE

## 2021-09-03 NOTE — Progress Notes (Signed)
PCP - Unk Pinto, MD ?Cardiologist - denies ? ?PPM/ICD - denies ?Device Orders - n/a ?Rep Notified - n/a ? ?Chest x-ray - n/a ?EKG - 05/09/2021 ?Stress Test - denies ?ECHO - 05/16/2013 ?Cardiac Cath - denies ? ?Sleep Study - yes, positive for OSA ?CPAP - patient is not using (can't tolerate) ? ?Fasting Blood Sugar - n/a ? ?Blood Thinner Instructions: n/a ? ?Aspirin Instructions: Patient was instructed: As of today, STOP taking any Aspirin (unless otherwise instructed by your surgeon) Aleve, Naproxen, Ibuprofen, Motrin, Advil, Goody's, BC's, all herbal medications, fish oil, and all vitamins. ? ?ERAS Protcol - n/a ? ?COVID TEST- done in PAT on 09/03/2021 ? ? ?Anesthesia review: yes - history of TIA; spinal headache after C-section; arteriosclerotic cardiovascular disease ? ?Patient denies shortness of breath, fever, cough and chest pain at PAT appointment ? ? ?All instructions explained to the patient, with a verbal understanding of the material. Patient agrees to go over the instructions while at home for a better understanding. Patient also instructed to self quarantine after being tested for COVID-19. The opportunity to ask questions was provided. ?  ?

## 2021-09-04 ENCOUNTER — Other Ambulatory Visit: Payer: Self-pay | Admitting: Adult Health

## 2021-09-04 NOTE — Anesthesia Preprocedure Evaluation (Addendum)
Anesthesia Evaluation  ?Patient identified by MRN, date of birth, ID band ?Patient awake ? ? ? ?Reviewed: ?Allergy & Precautions, H&P , NPO status , Patient's Chart, lab work & pertinent test results ? ?Airway ?Mallampati: III ? ?TM Distance: >3 FB ?Neck ROM: Full ? ?Mouth opening: Limited Mouth Opening ? Dental ?no notable dental hx. ?(+) Teeth Intact, Dental Advisory Given ?  ?Pulmonary ?sleep apnea ,  ?  ?Pulmonary exam normal ?breath sounds clear to auscultation ? ? ? ? ? ? Cardiovascular ?Exercise Tolerance: Good ?hypertension, Pt. on medications and Pt. on home beta blockers ? ?Rhythm:Regular Rate:Normal ? ? ?  ?Neuro/Psych ? Headaches, Depression TIA  ? GI/Hepatic ?Neg liver ROS, GERD  Medicated,  ?Endo/Other  ?Hypothyroidism  ? Renal/GU ?negative Renal ROS  ?negative genitourinary ?  ?Musculoskeletal ? ?(+) Arthritis , Fibromyalgia - ? Abdominal ?  ?Peds ? Hematology ? ?(+) Blood dyscrasia, anemia ,   ?Anesthesia Other Findings ? ? Reproductive/Obstetrics ?negative OB ROS ? ?  ? ? ? ? ? ? ? ? ? ? ? ? ? ?  ?  ? ? ? ? ? ? ?Anesthesia Physical ?Anesthesia Plan ? ?ASA: 3 ? ?Anesthesia Plan: General  ? ?Post-op Pain Management: Tylenol PO (pre-op)*  ? ?Induction: Intravenous ? ?PONV Risk Score and Plan: 4 or greater and Ondansetron, Dexamethasone and Treatment may vary due to age or medical condition ? ?Airway Management Planned: Oral ETT ? ?Additional Equipment:  ? ?Intra-op Plan:  ? ?Post-operative Plan: Extubation in OR ? ?Informed Consent: I have reviewed the patients History and Physical, chart, labs and discussed the procedure including the risks, benefits and alternatives for the proposed anesthesia with the patient or authorized representative who has indicated his/her understanding and acceptance.  ? ? ? ?Dental advisory given ? ?Plan Discussed with: CRNA ? ?Anesthesia Plan Comments: (PAT note by Karoline Caldwell, PA-C: ?Follows with pulmonologist Dr. Halford Chessman for history of  history of OSA intolerant to CPAP and regional bronchiectasis in setting of reflux. Last seen 08/20/2021 and per note she has lost significant amount of weight and sleeping better.  Home sleep study was ordered to reassess sleep apnea, but it was noted that this can be done after she recovers from upcoming back surgery.  She was recommended to continue as needed antitussives and expectorants for bronchiectasis. ? ?ASCVD entered in pt history in 05/12/2013. It appears this was entered as a qualifying diagnosis for ordering an echo that was done for eval of recent TIA (history of TIA 2005, 2014, 2016). Echo was normal.  Patient also has a history of chronic right-sided facial paralysis/bells palsy. ? ?Patient had updated echo in January 2020 to evaluate abnormal EKG (new Q wave in aVF) prior to undergoing orthopedic surgery.  Echo 07/06/2018 showed normal biventricular function, normal valves. ? ?Patient with limited neck ROM.  History of C2-C7 cervical fusion. ? ?Preop labs reviewed, unremarkable. ? ?EKG 05/09/2021: NSR.  Rate 77.  Low voltage chest leads. ? ?CTA chest 01/20/2021: ?IMPRESSION: ?No evidence of pulmonary artery embolism. ?? ?Mild bronchitic changes. No other acute cardiopulmonary abnormality. ?? ?Aortic Atherosclerosis (ICD10-I70.0). ? ?Echo complete 07/06/2018: ?INTERPRETATION ?NORMAL LEFT VENTRICULAR SYSTOLIC FUNCTION ?NORMAL RIGHT VENTRICULAR SYSTOLIC FUNCTION ?TRIVIAL REGURGITATION NOTED (MR, TR, PR) ?NO VALVULAR STENOSIS ?No prior study available for comparison ? ?PFT 11/21/2015: ?FVC-%Pred-Pre % 98 ?FEV1-%Pred-Pre % 110 ?FEV1FVC-%Pred-Pre % 110 ?TLC % pred % 88 ?RV % pred % 59 ?DLCO unc % pred % 77 ? ?)  ? ? ? ? ?Anesthesia Quick Evaluation ? ?

## 2021-09-04 NOTE — Progress Notes (Signed)
Anesthesia Chart Review: ? ?Follows with pulmonologist Dr. Halford Chessman for history of history of OSA intolerant to CPAP and regional bronchiectasis in setting of reflux. Last seen 08/20/2021 and per note she has lost significant amount of weight and sleeping better.  Home sleep study was ordered to reassess sleep apnea, but it was noted that this can be done after she recovers from upcoming back surgery.  She was recommended to continue as needed antitussives and expectorants for bronchiectasis. ? ?ASCVD entered in pt history in 05/12/2013. It appears this was entered as a qualifying diagnosis for ordering an echo that was done for eval of recent TIA (history of TIA 2005, 2014, 2016). Echo was normal.  Patient also has a history of chronic right-sided facial paralysis/bells palsy. ? ?Patient had updated echo in January 2020 to evaluate abnormal EKG (new Q wave in aVF) prior to undergoing orthopedic surgery.  Echo 07/06/2018 showed normal biventricular function, normal valves. ? ?Patient with limited neck ROM.  History of C2-C7 cervical fusion. ? ?Preop labs reviewed, unremarkable. ? ?EKG 05/09/2021: NSR.  Rate 77.  Low voltage chest leads. ? ?CTA chest 01/20/2021: ?IMPRESSION: ?No evidence of pulmonary artery embolism. ?  ?Mild bronchitic changes. No other acute cardiopulmonary abnormality. ?  ?Aortic Atherosclerosis (ICD10-I70.0). ? ?Echo complete 07/06/2018: ?INTERPRETATION ?NORMAL LEFT VENTRICULAR SYSTOLIC FUNCTION ?NORMAL RIGHT VENTRICULAR SYSTOLIC FUNCTION ?TRIVIAL REGURGITATION NOTED (MR, TR, PR) ?NO VALVULAR STENOSIS ?No prior study available for comparison ? ?PFT 11/21/2015: ?FVC-%Pred-Pre % 98  ?FEV1-%Pred-Pre % 110  ?FEV1FVC-%Pred-Pre % 110  ?TLC % pred % 88  ?RV % pred % 59  ?DLCO unc % pred % 77  ? ? ? ? ?Karoline Caldwell, PA-C ?[facility] Stay [facility]/Anesthesiology ?Phone [phone] ?09/04/2021 9:35 AM ? ?

## 2021-09-05 ENCOUNTER — Inpatient Hospital Stay (HOSPITAL_COMMUNITY): Payer: Medicare PPO | Admitting: Physician Assistant

## 2021-09-05 ENCOUNTER — Encounter (HOSPITAL_COMMUNITY): Payer: Self-pay | Admitting: Neurological Surgery

## 2021-09-05 ENCOUNTER — Other Ambulatory Visit: Payer: Self-pay

## 2021-09-05 ENCOUNTER — Inpatient Hospital Stay (HOSPITAL_COMMUNITY)
Admission: RE | Admit: 2021-09-05 | Discharge: 2021-09-06 | DRG: 460 | Disposition: A | Discharge status: Home / Self Care | Payer: Medicare PPO | Attending: Neurological Surgery | Admitting: Neurological Surgery

## 2021-09-05 ENCOUNTER — Inpatient Hospital Stay (HOSPITAL_COMMUNITY): Payer: Medicare PPO | Admitting: Anesthesiology

## 2021-09-05 ENCOUNTER — Inpatient Hospital Stay (HOSPITAL_COMMUNITY): Payer: Medicare PPO

## 2021-09-05 ENCOUNTER — Encounter (HOSPITAL_COMMUNITY)
Admission: RE | Disposition: A | Discharge status: Home / Self Care | Payer: Self-pay | Source: Home / Self Care | Attending: Neurological Surgery

## 2021-09-05 DIAGNOSIS — Z888 Allergy status to other drugs, medicaments and biological substances status: Secondary | ICD-10-CM

## 2021-09-05 DIAGNOSIS — G2581 Restless legs syndrome: Secondary | ICD-10-CM | POA: Diagnosis present

## 2021-09-05 DIAGNOSIS — F32A Depression, unspecified: Secondary | ICD-10-CM | POA: Diagnosis present

## 2021-09-05 DIAGNOSIS — Z981 Arthrodesis status: Secondary | ICD-10-CM | POA: Diagnosis not present

## 2021-09-05 DIAGNOSIS — K219 Gastro-esophageal reflux disease without esophagitis: Secondary | ICD-10-CM | POA: Diagnosis present

## 2021-09-05 DIAGNOSIS — Z8261 Family history of arthritis: Secondary | ICD-10-CM

## 2021-09-05 DIAGNOSIS — I700 Atherosclerosis of aorta: Secondary | ICD-10-CM | POA: Diagnosis present

## 2021-09-05 DIAGNOSIS — I10 Essential (primary) hypertension: Secondary | ICD-10-CM | POA: Diagnosis present

## 2021-09-05 DIAGNOSIS — M4326 Fusion of spine, lumbar region: Secondary | ICD-10-CM | POA: Diagnosis not present

## 2021-09-05 DIAGNOSIS — E785 Hyperlipidemia, unspecified: Secondary | ICD-10-CM | POA: Diagnosis present

## 2021-09-05 DIAGNOSIS — Z79899 Other long term (current) drug therapy: Secondary | ICD-10-CM

## 2021-09-05 DIAGNOSIS — E559 Vitamin D deficiency, unspecified: Secondary | ICD-10-CM | POA: Diagnosis present

## 2021-09-05 DIAGNOSIS — Z20822 Contact with and (suspected) exposure to covid-19: Secondary | ICD-10-CM | POA: Diagnosis present

## 2021-09-05 DIAGNOSIS — M5416 Radiculopathy, lumbar region: Secondary | ICD-10-CM | POA: Diagnosis not present

## 2021-09-05 DIAGNOSIS — E039 Hypothyroidism, unspecified: Secondary | ICD-10-CM | POA: Diagnosis present

## 2021-09-05 DIAGNOSIS — Z419 Encounter for procedure for purposes other than remedying health state, unspecified: Secondary | ICD-10-CM

## 2021-09-05 DIAGNOSIS — M48062 Spinal stenosis, lumbar region with neurogenic claudication: Principal | ICD-10-CM | POA: Diagnosis present

## 2021-09-05 DIAGNOSIS — G4730 Sleep apnea, unspecified: Secondary | ICD-10-CM | POA: Diagnosis present

## 2021-09-05 DIAGNOSIS — Z885 Allergy status to narcotic agent status: Secondary | ICD-10-CM | POA: Diagnosis not present

## 2021-09-05 DIAGNOSIS — M797 Fibromyalgia: Secondary | ICD-10-CM | POA: Diagnosis present

## 2021-09-05 DIAGNOSIS — Z79890 Hormone replacement therapy (postmenopausal): Secondary | ICD-10-CM

## 2021-09-05 DIAGNOSIS — Z882 Allergy status to sulfonamides status: Secondary | ICD-10-CM | POA: Diagnosis not present

## 2021-09-05 DIAGNOSIS — Z8673 Personal history of transient ischemic attack (TIA), and cerebral infarction without residual deficits: Secondary | ICD-10-CM | POA: Diagnosis not present

## 2021-09-05 DIAGNOSIS — I2510 Atherosclerotic heart disease of native coronary artery without angina pectoris: Secondary | ICD-10-CM | POA: Diagnosis present

## 2021-09-05 DIAGNOSIS — Z8262 Family history of osteoporosis: Secondary | ICD-10-CM | POA: Diagnosis not present

## 2021-09-05 DIAGNOSIS — Z881 Allergy status to other antibiotic agents status: Secondary | ICD-10-CM

## 2021-09-05 DIAGNOSIS — Z886 Allergy status to analgesic agent status: Secondary | ICD-10-CM

## 2021-09-05 DIAGNOSIS — M48061 Spinal stenosis, lumbar region without neurogenic claudication: Principal | ICD-10-CM | POA: Diagnosis present

## 2021-09-05 DIAGNOSIS — M1990 Unspecified osteoarthritis, unspecified site: Secondary | ICD-10-CM | POA: Diagnosis present

## 2021-09-05 HISTORY — PX: TRANSFORAMINAL LUMBAR INTERBODY FUSION W/ MIS 1 LEVEL: SHX6145

## 2021-09-05 SURGERY — MINIMALLY INVASIVE (MIS) TRANSFORAMINAL LUMBAR INTERBODY FUSION (TLIF) 1 LEVEL
Anesthesia: General | Site: Spine Lumbar | Laterality: Left

## 2021-09-05 MED ORDER — POLYETHYLENE GLYCOL 3350 17 G PO PACK
17.0000 g | PACK | Freq: Every day | ORAL | Status: DC
  Filled 2021-09-05: qty 1

## 2021-09-05 MED ORDER — OXYCODONE HCL 5 MG PO TABS
10.0000 mg | ORAL_TABLET | ORAL | Status: DC | PRN
  Administered 2021-09-05 – 2021-09-06 (×7): 10 mg via ORAL
  Filled 2021-09-05 (×7): qty 2

## 2021-09-05 MED ORDER — SODIUM CHLORIDE 0.9% FLUSH: 3.0000 mL | INTRAVENOUS | Status: DC | PRN

## 2021-09-05 MED ORDER — PHENYLEPHRINE 40 MCG/ML (10ML) SYRINGE FOR IV PUSH (FOR BLOOD PRESSURE SUPPORT)
PREFILLED_SYRINGE | INTRAVENOUS | Status: DC | PRN
  Administered 2021-09-05 (×3): 80 ug via INTRAVENOUS

## 2021-09-05 MED ORDER — ONDANSETRON HCL 4 MG/2ML IJ SOLN: 4.0000 mg | Freq: Four times a day (QID) | INTRAMUSCULAR | Status: DC | PRN

## 2021-09-05 MED ORDER — CHLORHEXIDINE GLUCONATE CLOTH 2 % EX PADS
6.0000 | MEDICATED_PAD | Freq: Once | CUTANEOUS | Status: DC
Start: 2021-09-05 — End: 2021-09-05

## 2021-09-05 MED ORDER — KETAMINE HCL 10 MG/ML IJ SOLN
INTRAMUSCULAR | Status: DC | PRN
  Administered 2021-09-05: 10 mg via INTRAVENOUS
  Administered 2021-09-05: 20 mg via INTRAVENOUS
  Administered 2021-09-05: 10 mg via INTRAVENOUS

## 2021-09-05 MED ORDER — ACETAMINOPHEN 325 MG PO TABS
650.0000 mg | ORAL_TABLET | ORAL | Status: DC | PRN
  Administered 2021-09-05 – 2021-09-06 (×2): 650 mg via ORAL
  Filled 2021-09-05 (×2): qty 2

## 2021-09-05 MED ORDER — MIDAZOLAM HCL 2 MG/2ML IJ SOLN
INTRAMUSCULAR | Status: DC | PRN
  Administered 2021-09-05: 1 mg via INTRAVENOUS

## 2021-09-05 MED ORDER — CEFAZOLIN SODIUM-DEXTROSE 2-4 GM/100ML-% IV SOLN
2.0000 g | INTRAVENOUS | Status: AC
  Administered 2021-09-05: 08:00:00 2 g via INTRAVENOUS
  Filled 2021-09-05: qty 100

## 2021-09-05 MED ORDER — OXYBUTYNIN CHLORIDE 5 MG PO TABS
5.0000 mg | ORAL_TABLET | Freq: Two times a day (BID) | ORAL | Status: DC
  Administered 2021-09-05 – 2021-09-06 (×2): 5 mg via ORAL
  Filled 2021-09-05 (×2): qty 1

## 2021-09-05 MED ORDER — ROPINIROLE HCL 1 MG PO TABS
2.0000 mg | ORAL_TABLET | Freq: Every day | ORAL | Status: DC
Start: 2021-09-05 — End: 2021-09-06
  Administered 2021-09-05: 21:00:00 2 mg via ORAL
  Filled 2021-09-05: qty 2

## 2021-09-05 MED ORDER — DULOXETINE HCL 30 MG PO CPEP
60.0000 mg | ORAL_CAPSULE | Freq: Every day | ORAL | Status: DC
  Administered 2021-09-05: 17:00:00 60 mg via ORAL
  Filled 2021-09-05: qty 2

## 2021-09-05 MED ORDER — CHLORHEXIDINE GLUCONATE 0.12 % MT SOLN
15.0000 mL | Freq: Once | OROMUCOSAL | Status: AC
  Administered 2021-09-05: 06:00:00 15 mL via OROMUCOSAL
  Filled 2021-09-05: qty 15

## 2021-09-05 MED ORDER — MIDAZOLAM HCL 2 MG/2ML IJ SOLN
INTRAMUSCULAR | Status: AC
  Filled 2021-09-05: qty 2

## 2021-09-05 MED ORDER — BISOPROLOL FUMARATE 5 MG PO TABS
2.5000 mg | ORAL_TABLET | Freq: Every day | ORAL | Status: DC
  Administered 2021-09-06: 2.5 mg via ORAL
  Filled 2021-09-05: qty 0.5

## 2021-09-05 MED ORDER — LIDOCAINE-EPINEPHRINE 1 %-1:100000 IJ SOLN
INTRAMUSCULAR | Status: AC
  Filled 2021-09-05: qty 1

## 2021-09-05 MED ORDER — PROPOFOL 10 MG/ML IV BOLUS
INTRAVENOUS | Status: DC | PRN
  Administered 2021-09-05: 80 mg via INTRAVENOUS

## 2021-09-05 MED ORDER — PHENOL 1.4 % MT LIQD: 1.0000 | OROMUCOSAL | Status: DC | PRN

## 2021-09-05 MED ORDER — EPHEDRINE SULFATE-NACL 50-0.9 MG/10ML-% IV SOSY
PREFILLED_SYRINGE | INTRAVENOUS | Status: DC | PRN
  Administered 2021-09-05 (×2): 5 mg via INTRAVENOUS

## 2021-09-05 MED ORDER — PHENYLEPHRINE HCL-NACL 20-0.9 MG/250ML-% IV SOLN
INTRAVENOUS | Status: DC | PRN
  Administered 2021-09-05: 25 ug/min via INTRAVENOUS

## 2021-09-05 MED ORDER — HYDROMORPHONE HCL 1 MG/ML IJ SOLN
0.2500 mg | INTRAMUSCULAR | Status: DC | PRN
  Administered 2021-09-05 (×2): 0.5 mg via INTRAVENOUS
  Administered 2021-09-05: 13:00:00 0.25 mg via INTRAVENOUS

## 2021-09-05 MED ORDER — METHOCARBAMOL 500 MG PO TABS
500.0000 mg | ORAL_TABLET | Freq: Four times a day (QID) | ORAL | Status: DC | PRN
  Administered 2021-09-05 – 2021-09-06 (×4): 500 mg via ORAL
  Filled 2021-09-05 (×4): qty 1

## 2021-09-05 MED ORDER — ORAL CARE MOUTH RINSE: 15.0000 mL | Freq: Once | OROMUCOSAL | Status: AC

## 2021-09-05 MED ORDER — PANTOPRAZOLE SODIUM 40 MG PO TBEC
80.0000 mg | DELAYED_RELEASE_TABLET | Freq: Every day | ORAL | Status: DC
  Administered 2021-09-05 – 2021-09-06 (×2): 80 mg via ORAL
  Filled 2021-09-05 (×2): qty 2

## 2021-09-05 MED ORDER — BUPIVACAINE LIPOSOME 1.3 % IJ SUSP
INTRAMUSCULAR | Status: AC
  Filled 2021-09-05: qty 20

## 2021-09-05 MED ORDER — BUPROPION HCL ER (XL) 300 MG PO TB24
300.0000 mg | ORAL_TABLET | Freq: Every day | ORAL | Status: DC
  Administered 2021-09-06: 300 mg via ORAL
  Filled 2021-09-05: qty 1

## 2021-09-05 MED ORDER — LIDOCAINE-EPINEPHRINE 1 %-1:100000 IJ SOLN
INTRAMUSCULAR | Status: DC | PRN
Start: 2021-09-05 — End: 2021-09-05
  Administered 2021-09-05: 10 mL

## 2021-09-05 MED ORDER — LACTATED RINGERS IV SOLN: INTRAVENOUS | Status: DC

## 2021-09-05 MED ORDER — THROMBIN 5000 UNITS EX SOLR
CUTANEOUS | Status: AC
  Filled 2021-09-05: qty 15000

## 2021-09-05 MED ORDER — BUPIVACAINE-EPINEPHRINE (PF) 0.5% -1:200000 IJ SOLN
INTRAMUSCULAR | Status: DC | PRN
Start: 2021-09-05 — End: 2021-09-05
  Administered 2021-09-05: 10 mL

## 2021-09-05 MED ORDER — CHLORHEXIDINE GLUCONATE CLOTH 2 % EX PADS: 6.0000 | MEDICATED_PAD | Freq: Once | CUTANEOUS | Status: DC

## 2021-09-05 MED ORDER — IRBESARTAN 150 MG PO TABS
300.0000 mg | ORAL_TABLET | Freq: Every day | ORAL | Status: DC
  Filled 2021-09-05: qty 2

## 2021-09-05 MED ORDER — BUPIVACAINE LIPOSOME 1.3 % IJ SUSP
INTRAMUSCULAR | Status: DC | PRN
  Administered 2021-09-05: 20 mL

## 2021-09-05 MED ORDER — HYDROMORPHONE HCL 1 MG/ML IJ SOLN
INTRAMUSCULAR | Status: AC
Start: 2021-09-05 — End: 2021-09-06
  Filled 2021-09-05: qty 1

## 2021-09-05 MED ORDER — LIDOCAINE 2% (20 MG/ML) 5 ML SYRINGE
INTRAMUSCULAR | Status: DC | PRN
  Administered 2021-09-05: 40 mg via INTRAVENOUS

## 2021-09-05 MED ORDER — FENTANYL CITRATE (PF) 250 MCG/5ML IJ SOLN
INTRAMUSCULAR | Status: AC
  Filled 2021-09-05: qty 5

## 2021-09-05 MED ORDER — DEXAMETHASONE SODIUM PHOSPHATE 10 MG/ML IJ SOLN
INTRAMUSCULAR | Status: DC | PRN
  Administered 2021-09-05: 10 mg via INTRAVENOUS

## 2021-09-05 MED ORDER — 0.9 % SODIUM CHLORIDE (POUR BTL) OPTIME
TOPICAL | Status: DC | PRN
  Administered 2021-09-05: 09:00:00 1000 mL

## 2021-09-05 MED ORDER — FENOFIBRATE 54 MG PO TABS
108.0000 mg | ORAL_TABLET | Freq: Every day | ORAL | Status: DC
  Administered 2021-09-05 – 2021-09-06 (×2): 108 mg via ORAL
  Filled 2021-09-05 (×2): qty 2

## 2021-09-05 MED ORDER — DEXMEDETOMIDINE (PRECEDEX) IN NS 20 MCG/5ML (4 MCG/ML) IV SYRINGE
PREFILLED_SYRINGE | INTRAVENOUS | Status: DC | PRN
  Administered 2021-09-05: 4 ug via INTRAVENOUS

## 2021-09-05 MED ORDER — PROPOFOL 10 MG/ML IV BOLUS
INTRAVENOUS | Status: AC
  Filled 2021-09-05: qty 40

## 2021-09-05 MED ORDER — FENTANYL CITRATE (PF) 250 MCG/5ML IJ SOLN
INTRAMUSCULAR | Status: DC | PRN
Start: 2021-09-05 — End: 2021-09-05
  Administered 2021-09-05 (×3): 50 ug via INTRAVENOUS
  Administered 2021-09-05: 100 ug via INTRAVENOUS

## 2021-09-05 MED ORDER — MECLIZINE HCL 25 MG PO TABS
25.0000 mg | ORAL_TABLET | Freq: Three times a day (TID) | ORAL | Status: DC | PRN
  Filled 2021-09-05: qty 1

## 2021-09-05 MED ORDER — KETAMINE HCL 50 MG/5ML IJ SOSY
PREFILLED_SYRINGE | INTRAMUSCULAR | Status: AC
  Filled 2021-09-05: qty 5

## 2021-09-05 MED ORDER — OXYCODONE HCL 5 MG PO TABS: 5.0000 mg | ORAL_TABLET | ORAL | Status: DC | PRN

## 2021-09-05 MED ORDER — LEVOTHYROXINE SODIUM 88 MCG PO TABS
88.0000 ug | ORAL_TABLET | Freq: Every day | ORAL | Status: DC
  Administered 2021-09-06: 88 ug via ORAL
  Filled 2021-09-05: qty 1

## 2021-09-05 MED ORDER — HYDROMORPHONE HCL 1 MG/ML IJ SOLN: 1.0000 mg | INTRAMUSCULAR | Status: DC | PRN

## 2021-09-05 MED ORDER — METHOCARBAMOL 1000 MG/10ML IJ SOLN: 500.0000 mg | Freq: Four times a day (QID) | INTRAVENOUS | Status: DC | PRN

## 2021-09-05 MED ORDER — CEFAZOLIN SODIUM-DEXTROSE 2-4 GM/100ML-% IV SOLN
2.0000 g | Freq: Three times a day (TID) | INTRAVENOUS | Status: AC
  Administered 2021-09-05 (×2): 2 g via INTRAVENOUS
  Filled 2021-09-05 (×2): qty 100

## 2021-09-05 MED ORDER — SUGAMMADEX SODIUM 200 MG/2ML IV SOLN
INTRAVENOUS | Status: DC | PRN
  Administered 2021-09-05: 240 mg via INTRAVENOUS

## 2021-09-05 MED ORDER — SODIUM CHLORIDE 0.9% FLUSH
3.0000 mL | Freq: Two times a day (BID) | INTRAVENOUS | Status: DC
  Administered 2021-09-05: 21:00:00 3 mL via INTRAVENOUS

## 2021-09-05 MED ORDER — ONDANSETRON HCL 4 MG PO TABS: 4.0000 mg | ORAL_TABLET | Freq: Four times a day (QID) | ORAL | Status: DC | PRN

## 2021-09-05 MED ORDER — HYDROMORPHONE HCL 1 MG/ML IJ SOLN
INTRAMUSCULAR | Status: AC
  Filled 2021-09-05: qty 1

## 2021-09-05 MED ORDER — ROCURONIUM BROMIDE 10 MG/ML (PF) SYRINGE
PREFILLED_SYRINGE | INTRAVENOUS | Status: DC | PRN
  Administered 2021-09-05: 20 mg via INTRAVENOUS
  Administered 2021-09-05: 50 mg via INTRAVENOUS
  Administered 2021-09-05: 20 mg via INTRAVENOUS

## 2021-09-05 MED ORDER — SODIUM CHLORIDE 0.9 % IV SOLN: 250.0000 mL | INTRAVENOUS | Status: DC

## 2021-09-05 MED ORDER — ACETAMINOPHEN 500 MG PO TABS
1000.0000 mg | ORAL_TABLET | Freq: Once | ORAL | Status: AC
  Administered 2021-09-05: 06:00:00 1000 mg via ORAL
  Filled 2021-09-05: qty 2

## 2021-09-05 MED ORDER — ACETAMINOPHEN 650 MG RE SUPP: 650.0000 mg | RECTAL | Status: DC | PRN

## 2021-09-05 MED ORDER — ROPINIROLE HCL 1 MG PO TABS
1.0000 mg | ORAL_TABLET | Freq: Two times a day (BID) | ORAL | Status: DC
  Administered 2021-09-05 – 2021-09-06 (×2): 1 mg via ORAL
  Filled 2021-09-05 (×2): qty 1

## 2021-09-05 MED ORDER — BUPIVACAINE-EPINEPHRINE 0.5% -1:200000 IJ SOLN
INTRAMUSCULAR | Status: AC
  Filled 2021-09-05: qty 1

## 2021-09-05 MED ORDER — ONDANSETRON HCL 4 MG/2ML IJ SOLN
INTRAMUSCULAR | Status: DC | PRN
  Administered 2021-09-05: 4 mg via INTRAVENOUS

## 2021-09-05 MED ORDER — HEMOSTATIC AGENTS (NO CHARGE) OPTIME
TOPICAL | Status: DC | PRN
  Administered 2021-09-05: 1 via TOPICAL

## 2021-09-05 MED ORDER — DEXMEDETOMIDINE (PRECEDEX) IN NS 20 MCG/5ML (4 MCG/ML) IV SYRINGE
PREFILLED_SYRINGE | INTRAVENOUS | Status: AC
  Filled 2021-09-05: qty 5

## 2021-09-05 MED ORDER — MENTHOL 3 MG MT LOZG: 1.0000 | LOZENGE | OROMUCOSAL | Status: DC | PRN

## 2021-09-05 MED ORDER — THROMBIN 5000 UNITS EX SOLR
CUTANEOUS | Status: DC | PRN
  Administered 2021-09-05: 09:00:00 5 mL via TOPICAL

## 2021-09-05 MED ORDER — GABAPENTIN 300 MG PO CAPS
300.0000 mg | ORAL_CAPSULE | Freq: Three times a day (TID) | ORAL | Status: DC
  Administered 2021-09-05 – 2021-09-06 (×3): 300 mg via ORAL
  Filled 2021-09-05 (×3): qty 1

## 2021-09-05 MED ORDER — DOCUSATE SODIUM 100 MG PO CAPS
100.0000 mg | ORAL_CAPSULE | Freq: Two times a day (BID) | ORAL | Status: DC
  Administered 2021-09-05 – 2021-09-06 (×3): 100 mg via ORAL
  Filled 2021-09-05 (×3): qty 1

## 2021-09-05 SURGICAL SUPPLY — 79 items
ADH SKN CLS APL DERMABOND .7 (GAUZE/BANDAGES/DRESSINGS) ×1
BAG COUNTER SPONGE SURGICOUNT (BAG) ×3 IMPLANT
BAG SPNG CNTER NS LX DISP (BAG) ×1
BAG SURGICOUNT SPONGE COUNTING (BAG) ×1
BAND INSRT 18 STRL LF DISP RB (MISCELLANEOUS) ×2
BAND RUBBER #18 3X1/16 STRL (MISCELLANEOUS) ×8 IMPLANT
BASKET BONE COLLECTION (BASKET) ×2 IMPLANT
BUR MATCHSTICK NEURO 3.0 LAGG (BURR) ×4 IMPLANT
CAGE LUM STD 22X9X9 4D (Cage) ×2 IMPLANT
CNTNR URN SCR LID CUP LEK RST (MISCELLANEOUS) ×2 IMPLANT
CONT SPEC 4OZ STRL OR WHT (MISCELLANEOUS) ×3
COVER BACK TABLE 60X90IN (DRAPES) ×4 IMPLANT
DECANTER SPIKE VIAL GLASS SM (MISCELLANEOUS) ×4 IMPLANT
DERMABOND ADVANCED (GAUZE/BANDAGES/DRESSINGS) ×2
DERMABOND ADVANCED .7 DNX12 (GAUZE/BANDAGES/DRESSINGS) IMPLANT
DRAIN JACKSON RD 7FR 3/32 (WOUND CARE) IMPLANT
DRAPE C-ARM 42X72 X-RAY (DRAPES) ×8 IMPLANT
DRAPE C-ARMOR (DRAPES) IMPLANT
DRAPE LAPAROTOMY 100X72X124 (DRAPES) ×4 IMPLANT
DRAPE MICROSCOPE LEICA (MISCELLANEOUS) ×4 IMPLANT
DRAPE STERI IOBAN 125X83 (DRAPES) IMPLANT
DRAPE SURG 17X23 STRL (DRAPES) ×4 IMPLANT
DRSG OPSITE POSTOP 4X6 (GAUZE/BANDAGES/DRESSINGS) ×4 IMPLANT
DURAPREP 26ML APPLICATOR (WOUND CARE) ×4 IMPLANT
ELECT BLADE INSULATED 6.5IN (ELECTROSURGICAL) ×3
ELECT REM PT RETURN 9FT ADLT (ELECTROSURGICAL) ×3
ELECTRODE BLDE INSULATED 6.5IN (ELECTROSURGICAL) ×2 IMPLANT
ELECTRODE REM PT RTRN 9FT ADLT (ELECTROSURGICAL) ×2 IMPLANT
EXTENDER TAB GUIDE SV 5.5/6.0 (INSTRUMENTS) ×24 IMPLANT
GAUZE 4X4 16PLY ~~LOC~~+RFID DBL (SPONGE) ×4 IMPLANT
GAUZE SPONGE 4X4 12PLY STRL (GAUZE/BANDAGES/DRESSINGS) ×2 IMPLANT
GAUZE SPONGE 4X4 16PLY XRAY LF (GAUZE/BANDAGES/DRESSINGS) IMPLANT
GLOVE SRG 8 PF TXTR STRL LF DI (GLOVE) ×4 IMPLANT
GLOVE SURG LTX SZ8 (GLOVE) ×10 IMPLANT
GLOVE SURG UNDER POLY LF SZ7 (GLOVE) ×8 IMPLANT
GLOVE SURG UNDER POLY LF SZ7.5 (GLOVE) ×4 IMPLANT
GLOVE SURG UNDER POLY LF SZ8 (GLOVE) ×6
GOWN STRL REUS W/ TWL LRG LVL3 (GOWN DISPOSABLE) IMPLANT
GOWN STRL REUS W/ TWL XL LVL3 (GOWN DISPOSABLE) ×2 IMPLANT
GOWN STRL REUS W/TWL 2XL LVL3 (GOWN DISPOSABLE) IMPLANT
GOWN STRL REUS W/TWL LRG LVL3 (GOWN DISPOSABLE)
GOWN STRL REUS W/TWL XL LVL3 (GOWN DISPOSABLE) ×12
GUIDEWIRE SHARP NITINOL 610 (WIRE) ×14 IMPLANT
HEMOSTAT POWDER KIT SURGIFOAM (HEMOSTASIS) ×2 IMPLANT
KIT BASIN OR (CUSTOM PROCEDURE TRAY) ×4 IMPLANT
KIT INFUSE XX SMALL 0.7CC (Orthopedic Implant) ×2 IMPLANT
KIT POSITION SURG JACKSON T1 (MISCELLANEOUS) ×4 IMPLANT
KIT TURNOVER KIT B (KITS) ×4 IMPLANT
MARKER SKIN DUAL TIP RULER LAB (MISCELLANEOUS) ×4 IMPLANT
NDL ASPIRATION RAN815N 8X15 (NEEDLE) IMPLANT
NDL HYPO 25X1 1.5 SAFETY (NEEDLE) ×2 IMPLANT
NDL SPNL 18GX3.5 QUINCKE PK (NEEDLE) IMPLANT
NEEDLE ASPIRATION RAN815N 8X15 (NEEDLE) ×1 IMPLANT
NEEDLE ASPIRATION RANFAC 8X15 (NEEDLE) ×2
NEEDLE HYPO 25X1 1.5 SAFETY (NEEDLE) ×3 IMPLANT
NEEDLE SPNL 18GX3.5 QUINCKE PK (NEEDLE) ×3 IMPLANT
NS IRRIG 1000ML POUR BTL (IV SOLUTION) ×4 IMPLANT
PACK LAMINECTOMY NEURO (CUSTOM PROCEDURE TRAY) ×4 IMPLANT
PAD ARMBOARD 7.5X6 YLW CONV (MISCELLANEOUS) ×12 IMPLANT
PATTIES SURGICAL .5 X.5 (GAUZE/BANDAGES/DRESSINGS) IMPLANT
PATTIES SURGICAL .5 X1 (DISPOSABLE) IMPLANT
PATTIES SURGICAL 1X1 (DISPOSABLE) IMPLANT
PUTTY DBF GRAFTON 3CC W/DELIVE (Putty) ×2 IMPLANT
ROD PERC CCM 5.5X55 (Rod) ×2 IMPLANT
ROD PERC CCM 5.5X60 (Rod) ×2 IMPLANT
SCREW MAS VOYAGER 6.5X40 (Screw) ×4 IMPLANT
SCREW SET 5.5/6.0MM SOLERA (Screw) ×12 IMPLANT
SCREW SPINAL IFIX 6.5X45 (Screw) ×8 IMPLANT
SPONGE SURGIFOAM ABS GEL SZ50 (HEMOSTASIS) ×2 IMPLANT
SPONGE T-LAP 4X18 ~~LOC~~+RFID (SPONGE) ×4 IMPLANT
STAPLER VISISTAT 35W (STAPLE) IMPLANT
SUT VIC AB 0 CT1 18XCR BRD8 (SUTURE) ×2 IMPLANT
SUT VIC AB 0 CT1 8-18 (SUTURE) ×3
SUT VIC AB 2-0 CP2 18 (SUTURE) ×4 IMPLANT
SUT VIC AB 3-0 SH 8-18 (SUTURE) ×4 IMPLANT
TOWEL GREEN STERILE (TOWEL DISPOSABLE) ×2 IMPLANT
TOWEL GREEN STERILE FF (TOWEL DISPOSABLE) ×2 IMPLANT
TRAY FOLEY MTR SLVR 16FR STAT (SET/KITS/TRAYS/PACK) ×4 IMPLANT
WATER STERILE IRR 1000ML POUR (IV SOLUTION) ×4 IMPLANT

## 2021-09-05 NOTE — Transfer of Care (Signed)
Immediate Anesthesia Transfer of Care Note ? ?Patient: Crystal [name] ? ?Procedure(s) Performed: MINIMALLY INVASIVE TRANSFORAMINAL LUMBAR INTERBODY FUSION LUMBAR THREE-FOUR; EXPLORE FUSION WITH REVISION OF HARDWARE LUMBAR FOUR-FIVE,LEFT SIDE APPROACH (Left: Spine Lumbar) ? ?Patient Location: PACU ? ?Anesthesia Type:General ? ?Level of Consciousness: drowsy and patient cooperative ? ?Airway & Oxygen Therapy: Patient Spontanous Breathing and Patient connected to nasal cannula oxygen ? ?Post-op Assessment: Report given to RN, Post -op Vital signs reviewed and stable and Patient moving all extremities ? ?Post vital signs: Reviewed and stable ? ?Last Vitals:  ?Vitals Value Taken Time  ?BP 130/78 09/05/21 1155  ?Temp    ?Pulse 77 09/05/21 1157  ?Resp 18 09/05/21 1157  ?SpO2 100 % 09/05/21 1157  ?Vitals shown include unvalidated device data. ? ?Last Pain:  ?Vitals:  ? 09/05/21 0600  ?TempSrc:   ?PainSc: 7   ?   ? ?Patients Stated Pain Goal: 3 (09/05/21 0600) ? ?Complications: No notable events documented. ?

## 2021-09-05 NOTE — H&P (Signed)
Providing Compassionate, Quality Care - Together  NEUROSURGERY HISTORY & PHYSICAL   Crystal [name] is an 71 y.o. female.   Chief Complaint: Low back pain with bilateral lower extremity radiculopathy and claudication HPI: This is a 71 year old female with a history of an L4-S1 fusion, with complaints of continued low back pain, worsening bilateral lower extremity radiculopathy.  Imaging revealed severe stenosis at L3-4 due to adjacent segment disease.  She failed conservative measures and therefore presents today for surgical decompression, instrumentation and fusion at L3-4, exploration of fusion at L4-5  Past Medical History:  Diagnosis Date   Arthritis    ASCVD (arteriosclerotic cardiovascular disease)    Closed nondisplaced subtrochanteric fracture of left femur ([place]) 09/03/2017   September 2018.  Status post surgery by Dr. Marcelino Scot   Depression    Dyspnea    Fibromyalgia    Gait disorder 10/26/2012   GERD (gastroesophageal reflux disease)    GI bleed    Hyperlipidemia    Hypertension    Hypothyroid    Periprosthetic supracondylar fracture of femur, left  03/23/2017   Pneumonia    Pre-diabetes    Restless leg syndrome    Rhinitis 06/25/2010   Sleep apnea    Spinal headache    with the C section   TIA (transient ischemic attack)    3         Vitamin D deficiency     Past Surgical History:  Procedure Laterality Date   ABDOMINAL HYSTERECTOMY  1992   ANTERIOR CERVICAL DECOMPRESSION/DISCECTOMY FUSION 4 LEVELS N/A 06/14/2015   Procedure: Cervical three-four Cervical four-five Cervical five- six Cervical six- seven  Anterior cervical decompression/diskectomy/fusion;  Surgeon: Erline Levine, MD;  Location: [facility];  Service: Neurosurgery;  Laterality: N/A;  C3-4 C4-5 C5-6 C6-7 Anterior cervical decompression/diskectomy/fusion   [place]     bilateral cataracts   JOINT REPLACEMENT      2   LEFT  1 RIGHT    KNEE SURGERY     ORIF FEMUR FRACTURE Left 03/24/2017   Procedure: OPEN REDUCTION INTERNAL FIXATION (ORIF) FEMUR FRACTURE;  Surgeon: Altamese [place], MD;  Location: [facility];  Service: Orthopedics;  Laterality: Left;   ROTATOR CUFF REPAIR Left 2005   2 LEFT  1  RIGHT   SHOULDER ADHESION RELEASE     SPINE SURGERY     TOE SURGERY     LEFT        TOE SURGERY     RIGHT  BIG TOE   TONSILLECTOMY  1960    Family History  Problem Relation Age of Onset   Leukemia Sister    Lupus Sister    Depression Sister    Rheum arthritis Sister    Heart disease Father    Hypertension Father    Hyperlipidemia Father    Neuropathy Father    Cancer Mother    Diabetes Mother    Osteoporosis Mother    Breast cancer Mother    Migraines Daughter    Social History:  reports that she has never smoked. She has never used smokeless tobacco. She reports current alcohol use. She reports that she does not use drugs.  Allergies:  Allergies  Allergen Reactions   Codeine Other (See Comments)    Headache   Statins Other (See Comments)    Extreme pain   Amitriptyline Other (See Comments)    Confusion  and hallucinations   Erythromycin Base Rash    Flushing/red face.   Fish Oil Nausea Only   Flax Seed [Bio-Flax] Rash   Hydrocodone Itching and Rash        Linseed Oil Rash   Morphine Nausea And Vomiting and Rash   Nsaids Nausea Only   Nuvigil [Armodafinil] Other (See Comments)    Unspecified   Sertraline Rash   Sinemet [Carbidopa W-Levodopa] Rash   Sulfa Antibiotics Rash   Tolmetin Nausea Only    Medications Prior to Admission  Medication Sig Dispense Refill   benzonatate (TESSALON) 200 MG capsule TAKE 1 CAP THREE TIMES DAILY AS NEEDED FOR COUGH. 30 capsule 1   bisoprolol (ZEBETA) 5 MG tablet Take  1/2 to 1  tablet  every Morning  for BP (Patient taking differently: Take 2.5 mg by mouth daily.) 90 tablet 3   buPROPion (WELLBUTRIN XL) 300 MG 24 hr tablet Take  1 tablet  Daily   for Mood, Focus &  Concentration (Patient taking differently: Take 300 mg by mouth daily.) 90 tablet 3   calcium carbonate (OS-CAL) 1250 (500 Ca) MG chewable tablet Chew 2 tablets by mouth daily.     carboxymethylcellulose (REFRESH PLUS) 0.5 % SOLN Place 1 drop into both eyes 3 (three) times daily as needed (dry eyes).     Cholecalciferol 125 MCG (5000 UT) TABS Take 5,000 Units by mouth daily.     dexamethasone (DECADRON) 0.5 MG/5ML solution Take 0.5 mg by mouth in the morning, at noon, and at bedtime. Swish for 3 minutes then spit.     DULoxetine (CYMBALTA) 60 MG capsule Take 1 capsule Daily for Chronic Pain (Patient taking differently: Take 60 mg by mouth daily with supper.) 90 capsule 3   fenofibrate micronized (LOFIBRA) 134 MG capsule Take 134 mg by mouth daily before breakfast.     Ferrous Sulfate (IRON PO) Take 1 tablet by mouth daily.     fluconazole (DIFLUCAN) 150 MG tablet Take 150 mg by mouth daily.     fluticasone (FLONASE) 50 MCG/ACT nasal spray USE TWO SPRAYS IN EACH NOSTRIL DAILY AS NEEDED (Patient taking differently: 2 sprays at bedtime.) 48 g 3   gabapentin (NEURONTIN) 300 MG capsule TAKE 1 CAPSULE BY MOUTH THREE TIMES A DAY 90 capsule 2   guaiFENesin (MUCINEX) 600 MG 12 hr tablet Take 600 mg by mouth daily.     hydrocortisone 2.5 % ointment Apply 1 application topically daily as needed (itching).     Lactobacillus (PROBIOTIC ACIDOPHILUS PO) Take 1 tablet by mouth daily.     levothyroxine (SYNTHROID) 88 MCG tablet Take  1 tablet  Daily  on an empty stomach with only water for 30 minutes & no Antacid meds, Calcium or Magnesium for 4 hours & avoid Biotin 90 tablet 3   meclizine (ANTIVERT) 25 MG tablet TAKE 1 TABLET (25 MG TOTAL) BY MOUTH 3 (THREE) TIMES DAILY AS NEEDED FOR DIZZINESS. 90 tablet 0   Multiple Vitamin (MULTIVITAMIN WITH MINERALS) TABS tablet Take 1 tablet by mouth daily.     neomycin-polymyxin-hydrocortisone (CORTISPORIN) 3.5-10000-1 OTIC suspension Instill  6 to 8 drops   to affected ear  3 to 4 x /day  and occlude with cotton (Patient taking differently: As needed for ear infections: Instill  6 to 8 drops  to affected ear  3 to 4 x /day  and occlude with cotton) 10 mL 1   olmesartan (BENICAR) 40 MG tablet Take 20 mg by mouth daily.  omeprazole (PRILOSEC) 40 MG capsule Take 40 mg by mouth daily.     oxybutynin (DITROPAN) 5 MG tablet TAKE 1 TABLET TWICE A DAY FOR BLADDER CONTROL 180 tablet 3   polyethylene glycol (MIRALAX / GLYCOLAX) 17 g packet Take 17-34 g by mouth daily. Dose depends on degree of constipation     rOPINIRole (REQUIP) 2 MG tablet TAKE 1 TABLET FOUR TIMES A DAY FOR RESTLESS LEGS (Patient taking differently: Take 1 mg by mouth See admin instructions. Take 1 tablet in AM, 1 tablet at dinner time, and 2 tablets at bedtime.) 360 tablet 3   tiZANidine (ZANAFLEX) 2 MG tablet TAKE 1/2 TO 1 TABLET EVERY 8 HOURS ONLY IF NEEDED FOR MUSCLE SPASMS (Patient taking differently: Take 2 mg by mouth at bedtime.) 270 tablet 0   traMADol (ULTRAM) 50 MG tablet Take 50 mg by mouth every 6 (six) hours as needed for moderate pain.     vitamin B-12 (CYANOCOBALAMIN) 500 MCG tablet Take 500 mcg by mouth daily.     senna-docusate (SENOKOT-S) 8.6-50 MG tablet Take 2 tablets by mouth daily as needed for mild constipation.     Wheat Dextrin (BENEFIBER DRINK MIX PO) Take 1 Scoop by mouth daily.      Results for orders placed or performed during the hospital encounter of 09/03/21 (from the past 48 hour(s))  SARS CORONAVIRUS 2 (TAT 6-24 HRS) Nasopharyngeal Nasopharyngeal Swab     Status: None   Collection Time: 09/03/21  1:20 PM   Specimen: Nasopharyngeal Swab  Result Value Ref Range   SARS Coronavirus 2 NEGATIVE NEGATIVE    Comment: (NOTE) SARS-CoV-2 target nucleic acids are NOT DETECTED.  The SARS-CoV-2 RNA is generally detectable in upper and lower respiratory specimens during the acute phase of infection. Negative results do not preclude SARS-CoV-2 infection, do not  rule out co-infections with other pathogens, and should not be used as the sole basis for treatment or other patient management decisions. Negative results must be combined with clinical observations, patient history, and epidemiological information. The expected result is Negative.  Fact Sheet for Patients: SugarRoll.be  Fact Sheet for Healthcare Providers: https://www.woods-mathews.com/  This test is not yet approved or cleared by the [place] FDA and  has been authorized for detection and/or diagnosis of SARS-CoV-2 by FDA under an Emergency Use Authorization (EUA). This EUA will remain  in effect (meaning this test can be used) for the duration of the COVID-19 declaration under Se ction 564(b)(1) of the Act, 21 U.S.C. section 360bbb-3(b)(1), unless the authorization is terminated or revoked sooner.  Performed at [facility], [place] [address] [place], [place] [address]   Basic metabolic panel per protocol     Status: Abnormal   Collection Time: 09/03/21  1:22 PM  Result Value Ref Range   Sodium 140 135 - 145 mmol/L   Potassium 4.2 3.5 - 5.1 mmol/L   Chloride 103 98 - 111 mmol/L   CO2 30 22 - 32 mmol/L   Glucose, Bld 122 (H) 70 - 99 mg/dL    Comment: Glucose reference range applies only to samples taken after fasting for at least 8 hours.   BUN 22 8 - 23 mg/dL   Creatinine, Ser 0.65 0.44 - 1.00 mg/dL   Calcium 9.3 8.9 - 10.3 mg/dL   GFR, Estimated >60 >60 mL/min    Comment: (NOTE) Calculated using the CKD-EPI Creatinine Equation (2021)    Anion gap 7 5 - 15    Comment: Performed at [facility], [place] [address] [place], [place] [address]  CBC per protocol     Status: None   Collection Time: 09/03/21  1:22 PM  Result Value Ref Range   WBC 7.3 4.0 - 10.5 K/uL   RBC 4.85 3.87 - 5.11 MIL/uL   Hemoglobin 14.6 12.0 - 15.0 g/dL   HCT 44.9 36.0 - 46.0 %   MCV 92.6 80.0 - 100.0 fL   MCH 30.1 26.0 - 34.0 pg   MCHC 32.5  30.0 - 36.0 g/dL   RDW 13.2 11.5 - 15.5 %   Platelets 266 150 - 400 K/uL   nRBC 0.0 0.0 - 0.2 %    Comment: Performed at [facility], [place] [address], [place], [place] [address]  Surgical PCR Screen     Status: None   Collection Time: 09/03/21  1:22 PM   Specimen: Nasal Mucosa; Nasal Swab  Result Value Ref Range   MRSA, PCR NEGATIVE NEGATIVE   Staphylococcus aureus NEGATIVE NEGATIVE    Comment: (NOTE) The Xpert SA Assay (FDA approved for NASAL specimens in patients 76 years of age and older), is one component of a comprehensive surveillance program. It is not intended to diagnose infection nor to guide or monitor treatment. Performed at [facility], [place] [address] [place], [place] [address]   Type and screen     Status: None   Collection Time: 09/03/21  1:32 PM  Result Value Ref Range   ABO/RH(D) A NEG    Antibody Screen NEG    Sample Expiration 09/17/2021,2359    Extend sample reason      NO TRANSFUSIONS OR PREGNANCY IN THE PAST 3 MONTHS Performed at [facility], [place] [address] [place], [place] [address]    No results found.  ROS All pertinent positives and negatives are listed in HPI above  Blood pressure 131/77, pulse 85, temperature 97.7 F (36.5 C), temperature source Oral, resp. rate 18, height 5' 3.5" (1.613 m), weight 62.7 kg, SpO2 97 %. Physical Exam  Awake alert oriented x3, no acute distress PERRLA Speech fluent appropriate Bilateral upper extremity full strength Bilateral lower extremity full strength except for dorsiflexion/plantarflexion 4+/5 Sensory intact light touch throughout Nonlabored breathing  Assessment/Plan 71 year old female with  L3-4 adjacent segment disease with severe stenosis  -OR today for L3-4 decompression, instrumentation and fusion, revision/exploration of fusion L4-5.  We discussed all risks, benefits and expected outcomes as well as alternatives to treatment.  Informed consent was obtained.  I answered all  of her questions.  She would like to proceed with surgical intervention.  Medical clearance was obtained.  Thank you for allowing me to participate in this patient's care.  Please do not hesitate to call with questions or concerns.   Elwin Sleight, [facility] Cell: [phone]

## 2021-09-05 NOTE — Anesthesia Postprocedure Evaluation (Signed)
Anesthesia Post Note ? ?Patient: Crystal [name] ? ?Procedure(s) Performed: MINIMALLY INVASIVE TRANSFORAMINAL LUMBAR INTERBODY FUSION LUMBAR THREE-FOUR; EXPLORE FUSION WITH REVISION OF HARDWARE LUMBAR FOUR-FIVE,LEFT SIDE APPROACH (Left: Spine Lumbar) ? ?  ? ?Patient location during evaluation: PACU ?Anesthesia Type: General ?Level of consciousness: awake and alert ?Pain management: pain level controlled ?Vital Signs Assessment: post-procedure vital signs reviewed and stable ?Respiratory status: spontaneous breathing, nonlabored ventilation and respiratory function stable ?Cardiovascular status: blood pressure returned to baseline and stable ?Postop Assessment: no apparent nausea or vomiting ?Anesthetic complications: no ? ? ?No notable events documented. ? ?Last Vitals:  ?Vitals:  ? 09/05/21 1320 09/05/21 1345  ?BP: 106/68 102/71  ?Pulse: 76 69  ?Resp: 13 16  ?Temp: (!) 36.3 ?C 36.4 ?C  ?SpO2: 93% 94%  ?  ?Last Pain:  ?Vitals:  ? 09/05/21 1345  ?TempSrc: Oral  ?PainSc:   ? ? ?  ?  ?  ?  ?  ?  ? ?Shahad Mazurek,W. EDMOND ? ? ? ? ?

## 2021-09-05 NOTE — Op Note (Signed)
? ?Providing Compassionate, Quality Care - Together ? ?Date of service: 09/05/2021 ? ?PREOP DIAGNOSIS:  ?L3-4 severe stenosis with bilateral, left greater than right radiculopathy ?History of L4-S1 fusion ?  ?POSTOP DIAGNOSIS: Same ?  ?PROCEDURE: ?Minimally invasive L 3-4 decompression and transforaminal lumbar interbody fusion and arthrodesis, left sided approach ?Exploration of fusion, L4-5 with revision of pedicle screw instrumentation ?Bilateral L3 nonsegmental pedicle screw instrumentation, Medtronic Solera L3: 6.5 x 45 mm bilaterally, L4: 6.5 x 45 mm bilaterally, L5: 6.5 x 40 mm bilaterally ?Placement of anterior biomechanical device, Medtronic titanium interbody device, Titan 9 x 22 mm ?Intraoperative use of autograft, same incision ?Intraoperative use of allograft  ?Intraoperative use of BMP,xxs ?Intraoperative use of fluoroscopy, greater than 1 hour ?Intraoperative use of microscope, for microdissection ?  ?SURGEON: Dr. Pieter Partridge C. Jessalyn Hinojosa, DO ?  ?ASSISTANT: Dr. Emelda Brothers, MD; Weston Brass, NP ?  ?ANESTHESIA: General Endotracheal ?  ?EBL: 200 cc ?  ?SPECIMENS: None ?  ?DRAINS: None ?  ?COMPLICATIONS: None ?  ?CONDITION: Hemodynamically stable ?  ?HISTORY: ?Crystal [name] is a 71 y.o. y.o. female who initially presented to the outpatient clinic with signs and symptoms consistent with lumbar stenosis and neurogenic claudication. MRI demonstrated severe stenosis at L3-4 with bilateral severe lateral recess stenosis due to adjacent segment disease.  She has a history of an L4-5 PLIF, L5-S1 ALIF.  She failed conservative measures therefore we discussed surgical intervention in the form of decompression, fusion at L3-4 and exploration of fusion at L4-5. Treatment options were discussed including continued conservative therapy including epidural steroid injections, pain control. All risks, benefits and expected outcomes were discussed agreed upon.  Informed consent was obtained. ?  ?PROCEDURE IN  DETAIL: ?The patient was brought to the operating room. After induction of general anesthesia, the patient was positioned on the operative table in the prone position on the [place] table. All pressure points were meticulously padded. Skin incision was then marked out and prepped and draped in the usual sterile fashion. Physician driven timeout was performed. ?  ?Using biplanar fluoroscopy, the C arm for sterilely draped and brought into the field and the paramedian incisions were planned over the L3-4, L4-5 interspace.  Local anesthetic was injected bilaterally.  Using a 10 blade, paramedian incision was created bilaterally.  Using Jamshidi, the bilateral L3 pedicles were accessed under biplanar fluoroscopy.  K wires were placed and the Jamshidi's were removed.  Using Bovie electrocautery, dissection was made paramedian to expose the previous hardware bilaterally at L4-5.  The previous pedicle screws, setscrews and rods were removed bilaterally.  K wires were placed in previous pedicle screw sites.  Attention was then turned to docking the Metrix dilator.  Using with a series of dilators, on the patient's left side the facet lamina junction was docked with a 26 x 7 mm tube.  This was confirmed to be in the appropriate trajectory under lateral fluoroscopy.  At this point the microscope was sterilely draped and brought into the field. ?  ?Using Bovie electrocautery, soft tissue was cleared from the lamina and facet at L3-4.  Using a high-speed drill and collecting the autograft, a laminectomy and complete facetectomy was performed down to the ligamentum flavum.  This autograft was saved for later.  The ligamentum flavum was identified to its attachment along the superior lamina and lateral pars.  The contralateral lateral recess was decompressed and lamina was removed with a high-speed drill. Using a series of micro curettes, the ligamentum flavum was gently elevated  and the epidural space was identified.  The  ligamentum flavum was completely resected using Kerrison rongeurs including the contralateral lateral recess along the patient's right side.  The traversing and exiting nerve roots were identified.  Using Kerrison rongeurs, the common dural tube and traversing and exiting nerve roots were completely decompressed from the ligamentum flavum.  They appeared pulsatile and noncompressed.  Camden's triangle was identified, the traversing nerve root was gently retracted medially, and the epidural space was coagulated and cleared of epidural veins.  The disc space was identified.  The disc annulus was then coagulated and incised with an 11 blade.  Using Kerrison rongeurs the annulotomy was widened.  Using a series of disc prep shavers and curettes a radical discectomy was performed to subchondral bleeding bone at both endplates.  The disc base was then trialed and found to be in 9 mm interbody size.  A mixture of autograft, bmp and allograft was then placed anteriorly and medially and packed with a bone tamp.  Using a nerve root retractor, the traversing nerve root was gently protected during placement of the interbody. ?  ?Using lateral fluoroscopy, the interbody was then placed under live fluoroscopy.  Appropriate placement was identified.  The remainder of the autograft and allograft was then placed laterally and the intervertebral space to the interbody device.  Hemostasis was achieved with passive hemostatics and bipolar cautery.  The traversing nerve root, neural tube and exiting nerve root were followed with a ball-tipped probe and noted to be noncompressed, pulsatile and in their normal position.  A few pieces of Gelfoam with thrombin were placed over the thecal sac.  The Metrix dilator tube was gently removed and hemostasis was achieved with bipolar cautery and the soft tissues. ?  ?The above listed pedicle screws were then placed under lateral fluoroscopy over the K wires bilaterally at L3, L4, L5.  Appropriate  sized rods were measured, contoured and placed percutaneously.  These were confirmed to be within the tulips, setscrews were placed and final tightened to the manufacturer's recommendations.  Percutaneous towers were removed from the screws.  Final AP and lateral fluoroscopy images confirmed appropriate placement of all hardware.  Hemostasis was achieved with bipolar cautery.  The wound was closed in layers, 2-0 and 3-0 Vicryl sutures for the dermis.  Skin was closed with skin glue.  Sterile dressing was applied. ?  ?At the end of the case all sponge, needle, and instrument counts were correct. The patient was then transferred to the stretcher, extubated, and taken to the post-anesthesia care unit in stable hemodynamic condition. ? ?

## 2021-09-05 NOTE — Anesthesia Procedure Notes (Signed)
Procedure Name: Intubation ?Date/Time: 09/05/2021 7:50 AM ?Performed by: Leonor Liv, CRNA ?Pre-anesthesia Checklist: Patient identified, Emergency Drugs available, Suction available and Patient being monitored ?Patient Re-evaluated:Patient Re-evaluated prior to induction ?Oxygen Delivery Method: Circle System Utilized ?Preoxygenation: Pre-oxygenation with 100% oxygen ?Induction Type: IV induction ?Ventilation: Mask ventilation without difficulty ?Laryngoscope Size: Mac and 3 ?Grade View: Grade I ?Tube type: Oral ?Tube size: 7.5 mm ?Number of attempts: 1 ?Airway Equipment and Method: Stylet and Oral airway ?Placement Confirmation: ETT inserted through vocal cords under direct vision, positive ETCO2 and breath sounds checked- equal and bilateral ?Secured at: 21 cm ?Tube secured with: Tape ?Dental Injury: Teeth and Oropharynx as per pre-operative assessment  ? ? ? ? ?

## 2021-09-06 MED ORDER — OXYCODONE-ACETAMINOPHEN 10-325 MG PO TABS: 1.0000 | ORAL_TABLET | Freq: Four times a day (QID) | ORAL | 0 refills | Status: DC | PRN

## 2021-09-06 MED ORDER — METHOCARBAMOL 500 MG PO TABS: 500.0000 mg | ORAL_TABLET | Freq: Four times a day (QID) | ORAL | 2 refills | Status: DC

## 2021-09-06 NOTE — Evaluation (Signed)
Physical Therapy Evaluation & Discharge ?Patient Details ?Name: Crystal [name] ?MRN: [id number] ?DOB: 12/09/1950 ?Today's Date: 09/06/2021 ? ?History of Present Illness ? 71 yo female admitted for spinal fusion of L3-4, and revision of L4-5. PMH includes: history of TIA 2005, 2014, 2016, chronic right-sided facial paralysis/bells palsy, C2-C7 fusion, L4-S1 fusion  ?Clinical Impression ? Patient admitted following above procedure. Patient functioning at [place] level for mobility with use of RW for comfort. Educated patient on back precautions, positioning in bed, and progressive walking program, patient verbalized understanding. Reinforced back precautions with returning to bed and patient wanting to utilize weighted blanket at home. No further skilled PT needs required acutely. No PT follow up recommended at this time.    ?   ? ?Recommendations for follow up therapy are one component of a multi-disciplinary discharge planning process, led by the attending physician.  Recommendations may be updated based on patient status, additional functional criteria and insurance authorization. ? ?Follow Up Recommendations No PT follow up ? ?  ?Assistance Recommended at Discharge PRN  ?Patient can return home with the following ?   ? ?  ?Equipment Recommendations None recommended by PT  ?Recommendations for Other Services ?    ?  ?Functional Status Assessment Patient has had a recent decline in their functional status and demonstrates the ability to make significant improvements in function in a reasonable and predictable amount of time.  ? ?  ?Precautions / Restrictions Precautions ?Precautions: Back ?Precaution Booklet Issued: Yes (comment) ?Restrictions ?Weight Bearing Restrictions: No  ? ?  ? ?Mobility ? Bed Mobility ?Overal bed mobility: Modified Independent ?  ?  ?  ?  ?  ?  ?General bed mobility comments: cues for log roll to return to supine as patient tends to break precautions to return to supine ?   ? ?Transfers ?Overall transfer level: Modified independent ?Equipment used: Rolling Kennetha Pearman (2 wheels) ?  ?  ?  ?  ?  ?  ?  ?  ?  ? ?Ambulation/Gait ?Ambulation/Gait assistance: Modified independent (Device/Increase time) ?Gait Distance (Feet): 400 Feet ?Assistive device: Rolling Jesenya Bowditch (2 wheels) ?Gait Pattern/deviations: WFL(Within Functional Limits) ?Gait velocity: decreased ?  ?  ?General Gait Details: uses RW for comfort. Does not require it ? ?Stairs ?Stairs: Yes ?Stairs assistance: Modified independent (Device/Increase time) ?Stair Management: One rail Right, Step to pattern, Forwards ?Number of Stairs: 10 ?  ? ?Wheelchair Mobility ?  ? ?Modified Rankin (Stroke Patients Only) ?  ? ?  ? ?Balance Overall balance assessment: Needs assistance ?Sitting-balance support: Feet supported ?Sitting balance-Leahy Scale: Normal ?  ?  ?Standing balance support: Bilateral upper extremity supported, Reliant on assistive device for balance ?Standing balance-Leahy Scale: Poor ?Standing balance comment: reliant on UE support for comfort ?  ?  ?  ?  ?  ?  ?  ?  ?  ?  ?  ?   ? ? ? ?Pertinent Vitals/Pain Pain Assessment ?Pain Assessment: Faces ?Faces Pain Scale: Hurts a little bit ?Pain Location: back ?Pain Descriptors / Indicators: Discomfort, Grimacing ?Pain Intervention(s): Monitored during session, Repositioned  ? ? ?Home Living Family/patient expects to be discharged to:: Private residence ?Living Arrangements: Spouse/significant other ?Available Help at Discharge: Family;Available 24 hours/day ?Type of Home: House ?Home Access: Stairs to enter ?Entrance Stairs-Rails: Right;Left ?Entrance Stairs-Number of Steps: 3 ?Alternate Level Stairs-Number of Steps: flight ?Home Layout: Two level;Bed/bath upstairs ?Home Equipment: Conservation officer, nature (2 wheels);Rollator (4 wheels);Cane - single point;Shower seat;Wheelchair - manual ?   ?  ?Prior  Function Prior Level of Function : Independent/Modified Independent ?  ?  ?  ?  ?  ?  ?Mobility  Comments: uses RW, rollator, and cane intermittently depending on the day. ?  ?  ? ? ?Hand Dominance  ? Dominant Hand: Left ? ?  ?Extremity/Trunk Assessment  ? Upper Extremity Assessment ?Upper Extremity Assessment: Defer to OT evaluation ?  ? ?Lower Extremity Assessment ?Lower Extremity Assessment: Generalized weakness ?  ? ?Cervical / Trunk Assessment ?Cervical / Trunk Assessment: Back Surgery  ?Communication  ? Communication: No difficulties  ?Cognition Arousal/Alertness: Awake/alert ?Behavior During Therapy: [facility] for tasks assessed/performed ?Overall Cognitive Status: Within Functional Limits for tasks assessed ?  ?  ?  ?  ?  ?  ?  ?  ?  ?  ?  ?  ?  ?  ?  ?  ?  ?  ?  ? ?  ?General Comments   ? ?  ?Exercises    ? ?Assessment/Plan  ?  ?PT Assessment Patient does not need any further PT services  ?PT Problem List   ? ?   ?  ?PT Treatment Interventions     ? ?PT Goals (Current goals can be found in the Care Plan section)  ?Acute Rehab PT Goals ?Patient Stated Goal: to go home ?PT Goal Formulation: All assessment and education complete, DC therapy ? ?  ?Frequency   ?  ? ? ?Co-evaluation   ?  ?  ?  ?  ? ? ?  ?AM-PAC PT "6 Clicks" Mobility  ?Outcome Measure Help needed turning from your back to your side while in a flat bed without using bedrails?: None ?Help needed moving from lying on your back to sitting on the side of a flat bed without using bedrails?: None ?Help needed moving to and from a bed to a chair (including a wheelchair)?: None ?Help needed standing up from a chair using your arms (e.g., wheelchair or bedside chair)?: None ?Help needed to walk in hospital room?: None ?Help needed climbing 3-5 steps with a railing? : None ?6 Click Score: 24 ? ?  ?End of Session   ?Activity Tolerance: Patient tolerated treatment well ?Patient left: in bed;with call bell/phone within reach ?Nurse Communication: Mobility status ?PT Visit Diagnosis: Muscle weakness (generalized) (M62.81) ?  ? ?Time: 9753-0051 ?PT Time  Calculation (min) (ACUTE ONLY): 24 min ? ? ?Charges:   PT Evaluation ?$PT Eval Low Complexity: 1 Low ?PT Treatments ?$Therapeutic Activity: 8-22 mins ?  ?   ? ? ?Tensley Wery A. Gilford Rile, PT, DPT ?Acute Rehabilitation Services ?Pager [phone] ?Office [phone] ? ? ?Issiah Huffaker A Donterrius Santucci ?09/06/2021, 8:44 AM ? ?

## 2021-09-06 NOTE — Progress Notes (Signed)
Patient alert and oriented, mae's well, voiding adequate amount of urine, swallowing without difficulty, no c/o pain at time of discharge. Patient discharged home with family. Script and discharged instructions given to patient. Patient and spouse stated understanding of instructions given. Patient has an appointment with Dr. Reatha Armour in 2 weeks ?

## 2021-09-06 NOTE — Evaluation (Signed)
Occupational Therapy Evaluation ?Patient Details ?Name: Crystal [name] ?MRN: [id number] ?DOB: [birth year] ?Today's Date: 09/06/2021 ? ? ?History of Present Illness 71 yo female admitted for spinal fusion of L3-4, and revision of L4-5. PMH includes: history of TIA 2005, 2014, 2016, chronic right-sided facial paralysis/bells palsy, C2-C7 fusion, L4-S1 fusion  ? ?Clinical Impression ?  ?Prior to this admission, patient was independent in ADLs and IADLs, using shower seat and long handled sponge to bathe, and living with her husband. Currently, patient is at her baseline for ADLs, with min verbal cues for pacing due to impulsivity and adherence to back precautions. Patient able to verbalize and demonstrate back precautions at end of session with improved adherence. OT not recommending OT follow up at discharge and patient demonstrating no further acute needs.  ?   ? ?Recommendations for follow up therapy are one component of a multi-disciplinary discharge planning process, led by the attending physician.  Recommendations may be updated based on patient status, additional functional criteria and insurance authorization.  ? ?Follow Up Recommendations ? No OT follow up  ?  ?Assistance Recommended at Discharge PRN  ?Patient can return home with the following Assist for transportation;Help with stairs or ramp for entrance;A little help with bathing/dressing/bathroom;Assistance with cooking/housework ? ?  ?Functional Status Assessment ? Patient has had a recent decline in their functional status and demonstrates the ability to make significant improvements in function in a reasonable and predictable amount of time.  ?Equipment Recommendations ? None recommended by OT  ?  ?Recommendations for Other Services   ? ? ?  ?Precautions / Restrictions Precautions ?Precautions: Back ?Precaution Booklet Issued: Yes (comment) ?Restrictions ?Weight Bearing Restrictions: No  ? ?  ? ?Mobility Bed Mobility ?Overal bed mobility:  Modified Independent ?  ?  ?  ?  ?  ?  ?General bed mobility comments: cues for log roll to return to supine as patient tends to break precautions to return to supine ?  ? ?Transfers ?Overall transfer level: Modified independent ?Equipment used: Rolling walker (2 wheels) ?  ?  ?  ?  ?  ?  ?  ?  ?  ? ?  ?Balance Overall balance assessment: Needs assistance ?Sitting-balance support: Feet supported ?Sitting balance-Leahy Scale: Normal ?  ?  ?Standing balance support: Bilateral upper extremity supported, Reliant on assistive device for balance ?Standing balance-Leahy Scale: Poor ?Standing balance comment: reliant on UE support for comfort ?  ?  ?  ?  ?  ?  ?  ?  ?  ?  ?  ?   ? ?ADL either performed or assessed with clinical judgement  ? ?ADL Overall ADL's : At baseline ?  ?  ?  ?  ?  ?  ?  ?  ?  ?  ?  ?  ?  ?  ?  ?  ?  ?  ?  ?General ADL Comments: Patient appropriately adhering to precautions, cues to pace due to minimal impulsivity  ? ? ? ?Vision Baseline Vision/History: 0 No visual deficits ?Ability to See in Adequate Light: 0 Adequate ?Patient Visual Report: No change from baseline ?   ?   ?Perception   ?  ?Praxis   ?  ? ?Pertinent Vitals/Pain Pain Assessment ?Pain Assessment: Faces ?Faces Pain Scale: Hurts a little bit ?Pain Location: back ?Pain Descriptors / Indicators: Discomfort, Grimacing ?Pain Intervention(s): Limited activity within patient's tolerance, Monitored during session, Premedicated before session, Repositioned  ? ? ? ?Hand Dominance Left ?  ?Extremity/Trunk  Assessment Upper Extremity Assessment ?Upper Extremity Assessment: Overall WFL for tasks assessed;Generalized weakness ?  ?Lower Extremity Assessment ?Lower Extremity Assessment: Generalized weakness;Defer to PT evaluation ?  ?Cervical / Trunk Assessment ?Cervical / Trunk Assessment: Back Surgery ?  ?Communication Communication ?Communication: No difficulties ?  ?Cognition Arousal/Alertness: Awake/alert ?Behavior During Therapy: [facility] for tasks  assessed/performed ?Overall Cognitive Status: Within Functional Limits for tasks assessed ?  ?  ?  ?  ?  ?  ?  ?  ?  ?  ?  ?  ?  ?  ?  ?  ?  ?  ?  ?General Comments    ? ?  ?Exercises   ?  ?Shoulder Instructions    ? ? ?Home Living Family/patient expects to be discharged to:: Private residence ?Living Arrangements: Spouse/significant other ?Available Help at Discharge: Family;Available 24 hours/day ?Type of Home: House ?Home Access: Stairs to enter ?Entrance Stairs-Number of Steps: 3 ?Entrance Stairs-Rails: Right;Left ?Home Layout: Two level;Bed/bath upstairs ?Alternate Level Stairs-Number of Steps: flight ?Alternate Level Stairs-Rails: Right ?Bathroom Shower/Tub: Tub/shower unit ?  ?Bathroom Toilet: Standard ?  ?  ?Home Equipment: Conservation officer, nature (2 wheels);Rollator (4 wheels);Cane - single point;Shower seat;Wheelchair - Psychologist, educational;Toilet riser ?Adaptive Equipment: Reacher ?  ?  ? ?  ?Prior Functioning/Environment Prior Level of Function : Independent/Modified Independent ?  ?  ?  ?  ?  ?  ?Mobility Comments: uses RW, rollator, and cane intermittently depending on the day. ?ADLs Comments: Independent, uses shower seat and long handled sponge/shower handle to bathe ?  ? ?  ?  ?OT Problem List: Decreased strength;Decreased activity tolerance;Impaired balance (sitting and/or standing);Decreased safety awareness;Decreased knowledge of precautions;Pain ?  ?   ?OT Treatment/Interventions: Self-care/ADL training;DME and/or AE instruction;Energy conservation;Therapeutic activities;Balance training;Patient/family education;Therapeutic exercise  ?  ?OT Goals(Current goals can be found in the care plan section) Acute Rehab OT Goals ?Patient Stated Goal: to get back to my normal ?OT Goal Formulation: With patient ?Time For Goal Achievement: 09/20/21 ?Potential to Achieve Goals: Good  ?OT Frequency: Min 2X/week ?  ? ?Co-evaluation   ?  ?  ?  ?  ? ?  ?AM-PAC OT "6 Clicks" Daily Activity     ?Outcome Measure Help  from another person eating meals?: None ?Help from another person taking care of personal grooming?: None ?Help from another person toileting, which includes using toliet, bedpan, or urinal?: None ?Help from another person bathing (including washing, rinsing, drying)?: A Little ?Help from another person to put on and taking off regular upper body clothing?: None ?Help from another person to put on and taking off regular lower body clothing?: None ?6 Click Score: 23 ?  ?End of Session Equipment Utilized During Treatment: Rolling walker (2 wheels) ?Nurse Communication: Mobility status;Precautions ? ?Activity Tolerance: Patient tolerated treatment well ?Patient left: with call bell/phone within reach;in bed ? ?OT Visit Diagnosis: Unsteadiness on feet (R26.81);Other abnormalities of gait and mobility (R26.89);Muscle weakness (generalized) (M62.81);Pain ?Pain - part of body:  (Back)  ?              ?Time: 4492-0100 ?OT Time Calculation (min): 22 min ?Charges:  OT General Charges ?$OT Visit: 1 Visit ?OT Evaluation ?$OT Eval Moderate Complexity: 1 Mod ? ?Crystal [name], OTR/L ?Acute Rehabilitation Services ?[phone] ?[phone]  ? ?Crystal Ports Ajahni Nay ?09/06/2021, 9:13 AM ?

## 2021-09-06 NOTE — Discharge Summary (Signed)
Physician Discharge Summary  ?Patient ID: ?Crystal [name] ?MRN: [id number] ?DOB/AGE: 1951-06-24 71 y.o. ? ?Admit date: 09/05/2021 ?Discharge date: 09/06/2021 ? ?Admission Diagnoses: ?L3-4 severe stenosis with bilateral, left greater than right radiculopathy ?History of L4-S1 fusion ? ?Discharge Diagnoses:  ?L3-4 severe stenosis with bilateral, left greater than right radiculopathy ?History of L4-S1 fusion ?Principal Problem: ?  Lumbar spinal stenosis ? ? ?Discharged Condition: good ? ?Hospital Course: The patient was admitted on 09/05/2021 and taken to the operating room where the patient underwent L3-4 decompression, instrumentation and fusion, revision/exploration of fusion L4-5. The patient tolerated the procedure well and was taken to the recovery room and then to the floor in stable condition. The hospital course was routine. There were no complications. The wound remained clean dry and intact. Pt had appropriate back soreness. No complaints of leg pain or new N/T/W. The patient remained afebrile with stable vital signs, and tolerated a regular diet. The patient continued to increase activities, and pain was well controlled with oral pain medications. ? ? ?Consults: None ? ?Significant Diagnostic Studies: radiology: X-Ray: intraoperative ? ? ?Treatments: surgery:  ?Minimally invasive L 3-4 decompression and transforaminal lumbar interbody fusion and arthrodesis, left sided approach ?Exploration of fusion, L4-5 with revision of pedicle screw instrumentation ?Bilateral L3 nonsegmental pedicle screw instrumentation, Medtronic Solera L3: 6.5 x 45 mm bilaterally, L4: 6.5 x 45 mm bilaterally, L5: 6.5 x 40 mm bilaterally ?Placement of anterior biomechanical device, Medtronic titanium interbody device, Titan 9 x 22 mm ?Intraoperative use of autograft, same incision ?Intraoperative use of allograft  ?Intraoperative use of BMP,xxs ?Intraoperative use of fluoroscopy, greater than 1 hour ?Intraoperative use of  microscope, for microdissection ?  ? ?Discharge Exam: ?Blood pressure 114/67, pulse 95, temperature 98.9 ?F (37.2 ?C), temperature source Oral, resp. rate 16, height 5' 3.5" (1.613 m), weight 62.7 kg, SpO2 94 %. ?Physical Exam: Patient is awake, A/O X 4, conversant, and in good spirits. They are in NAD and VSS. Doing well. Speech is fluent and appropriate. MAEW with good strength that is symmetric bilaterally.  BUE 5/5 throughout, BLE 5/5 throughout. Sensation to light touch is intact. PERLA, EOMI. CNs grossly intact. Dressing is clean dry intact. Incision is well approximated with no drainage, erythema, or edema. ? ? ? ?Disposition: Discharge disposition: 01-Home or Self Care ? ? ? ? ? ? ?Discharge Instructions   ? ? Incentive spirometry RT   Complete by: As directed ?  ? ?  ? ?Allergies as of 09/06/2021   ? ?   Reactions  ? Codeine Other (See Comments)  ? Headache  ? Statins Other (See Comments)  ? Extreme pain  ? Amitriptyline Other (See Comments)  ? Confusion and hallucinations  ? Erythromycin Base Rash  ? Flushing/red face.  ? Fish Oil Nausea Only  ? Flax Seed [bio-flax] Rash  ? Hydrocodone Itching, Rash  ?   ? Linseed Oil Rash  ? Morphine Nausea And Vomiting, Rash  ? Nsaids Nausea Only  ? Nuvigil [armodafinil] Other (See Comments)  ? Unspecified  ? Sertraline Rash  ? Sinemet [carbidopa W-levodopa] Rash  ? Sulfa Antibiotics Rash  ? Tolmetin Nausea Only  ? ?  ? ?  ?Medication List  ?  ? ?STOP taking these medications   ? ?tiZANidine 2 MG tablet ?Commonly known as: ZANAFLEX ?  ?traMADol 50 MG tablet ?Commonly known as: ULTRAM ?  ? ?  ? ?TAKE these medications   ? ?BENEFIBER DRINK MIX PO ?Take 1 Scoop by mouth daily. ?  ?  benzonatate 200 MG capsule ?Commonly known as: TESSALON ?TAKE 1 CAP THREE TIMES DAILY AS NEEDED FOR COUGH. ?  ?bisoprolol 5 MG tablet ?Commonly known as: ZEBETA ?Take  1/2 to 1  tablet  every Morning  for BP ?What changed:  ?how much to take ?how to take this ?when to take this ?additional  instructions ?  ?buPROPion 300 MG 24 hr tablet ?Commonly known as: WELLBUTRIN XL ?Take  1 tablet  Daily  for Mood, Focus &  Concentration ?What changed:  ?how much to take ?how to take this ?when to take this ?additional instructions ?  ?calcium carbonate 1250 (500 Ca) MG chewable tablet ?Commonly known as: OS-CAL ?Chew 2 tablets by mouth daily. ?  ?carboxymethylcellulose 0.5 % Soln ?Commonly known as: REFRESH PLUS ?Place 1 drop into both eyes 3 (three) times daily as needed (dry eyes). ?  ?Cholecalciferol 125 MCG (5000 UT) Tabs ?Take 5,000 Units by mouth daily. ?  ?dexamethasone 0.5 MG/5ML solution ?Commonly known as: DECADRON ?Take 0.5 mg by mouth in the morning, at noon, and at bedtime. Swish for 3 minutes then spit. ?  ?DULoxetine 60 MG capsule ?Commonly known as: CYMBALTA ?Take 1 capsule Daily for Chronic Pain ?What changed:  ?how much to take ?how to take this ?when to take this ?additional instructions ?  ?fenofibrate micronized 134 MG capsule ?Commonly known as: LOFIBRA ?Take 134 mg by mouth daily before breakfast. ?  ?fluconazole 150 MG tablet ?Commonly known as: DIFLUCAN ?Take 150 mg by mouth daily. ?  ?fluticasone 50 MCG/ACT nasal spray ?Commonly known as: FLONASE ?USE TWO SPRAYS IN EACH NOSTRIL DAILY AS NEEDED ?What changed:  ?how much to take ?when to take this ?additional instructions ?  ?gabapentin 300 MG capsule ?Commonly known as: NEURONTIN ?TAKE 1 CAPSULE BY MOUTH THREE TIMES A DAY ?  ?guaiFENesin 600 MG 12 hr tablet ?Commonly known as: [place] ?Take 600 mg by mouth daily. ?  ?hydrocortisone 2.5 % ointment ?Apply 1 application topically daily as needed (itching). ?  ?IRON PO ?Take 1 tablet by mouth daily. ?  ?levothyroxine 88 MCG tablet ?Commonly known as: Synthroid ?Take  1 tablet  Daily  on an empty stomach with only water for 30 minutes & no Antacid meds, Calcium or Magnesium for 4 hours & avoid Biotin ?  ?meclizine 25 MG tablet ?Commonly known as: ANTIVERT ?TAKE 1 TABLET (25 MG TOTAL) BY MOUTH  3 (THREE) TIMES DAILY AS NEEDED FOR DIZZINESS. ?  ?methocarbamol 500 MG tablet ?Commonly known as: Robaxin ?Take 1 tablet (500 mg total) by mouth 4 (four) times daily. ?  ?multivitamin with minerals Tabs tablet ?Take 1 tablet by mouth daily. ?  ?neomycin-polymyxin-hydrocortisone 3.5-10000-1 OTIC suspension ?Commonly known as: CORTISPORIN ?Instill  6 to 8 drops  to affected ear  3 to 4 x /day  and occlude with cotton ?What changed: additional instructions ?  ?olmesartan 40 MG tablet ?Commonly known as: BENICAR ?Take 20 mg by mouth daily. ?  ?omeprazole 40 MG capsule ?Commonly known as: PRILOSEC ?Take 40 mg by mouth daily. ?  ?oxybutynin 5 MG tablet ?Commonly known as: DITROPAN ?TAKE 1 TABLET TWICE A DAY FOR BLADDER CONTROL ?  ?oxyCODONE-acetaminophen 10-325 MG tablet ?Commonly known as: Percocet ?Take 1-2 tablets by mouth every 6 (six) hours as needed for pain. ?  ?polyethylene glycol 17 g packet ?Commonly known as: MIRALAX / GLYCOLAX ?Take 17-34 g by mouth daily. Dose depends on degree of constipation ?  ?PROBIOTIC ACIDOPHILUS PO ?Take 1 tablet by mouth daily. ?  ?  rOPINIRole 2 MG tablet ?Commonly known as: REQUIP ?TAKE 1 TABLET FOUR TIMES A DAY FOR RESTLESS LEGS ?What changed: See the new instructions. ?  ?senna-docusate 8.6-50 MG tablet ?Commonly known as: Senokot-S ?Take 2 tablets by mouth daily as needed for mild constipation. ?  ?vitamin B-12 500 MCG tablet ?Commonly known as: CYANOCOBALAMIN ?Take 500 mcg by mouth daily. ?  ? ?  ? ? ? ?Signed: ?Marvis Moeller, DNP, NP-C ?09/06/2021, 8:51 AM ? ? ?

## 2021-09-06 NOTE — Discharge Instructions (Signed)
Wound Care ?Remove dressing in 3 days ?Leave incision open to air. ?You may shower. ?Do not scrub directly on incision.  ?Do not put any creams, lotions, or ointments on incision. ?Activity ?Walk each and every day, increasing distance each day. ?No lifting greater than 5 lbs.  Avoid bending, arching, and twisting. ?No driving for 2 weeks; may ride as a passenger locally. ? ?Diet ?Resume your normal diet.  ? ?Call Your Doctor If Any of These Occur ?Redness, drainage, or swelling at the wound.  ?Temperature greater than 101 degrees. ?Severe pain not relieved by pain medication. ?Incision starts to come apart. ?Follow Up Appt ?Call today for appointment in 2-3 weeks ([phone]) or for problems.  If you have any hardware placed in your spine, you will need an x-ray before your appointment.  ?

## 2021-09-09 ENCOUNTER — Encounter (HOSPITAL_COMMUNITY): Payer: Self-pay | Admitting: Neurological Surgery

## 2021-09-09 NOTE — Addendum Note (Signed)
Addendum  created 09/09/21 1103 by Josephine Igo, CRNA  ? Charge Capture section accepted  ?  ?

## 2021-09-25 ENCOUNTER — Encounter: Payer: Self-pay | Admitting: Internal Medicine

## 2021-09-25 ENCOUNTER — Ambulatory Visit (INDEPENDENT_AMBULATORY_CARE_PROVIDER_SITE_OTHER): Payer: Medicare PPO | Admitting: Internal Medicine

## 2021-09-25 VITALS — BP 110/60 | HR 80 | Temp 97.9°F | Resp 16 | Ht 63.0 in | Wt 138.4 lb

## 2021-09-25 DIAGNOSIS — N182 Chronic kidney disease, stage 2 (mild): Secondary | ICD-10-CM

## 2021-09-25 DIAGNOSIS — R0989 Other specified symptoms and signs involving the circulatory and respiratory systems: Secondary | ICD-10-CM | POA: Diagnosis not present

## 2021-09-25 DIAGNOSIS — E1122 Type 2 diabetes mellitus with diabetic chronic kidney disease: Secondary | ICD-10-CM | POA: Diagnosis not present

## 2021-09-25 DIAGNOSIS — E1169 Type 2 diabetes mellitus with other specified complication: Secondary | ICD-10-CM

## 2021-09-25 DIAGNOSIS — E785 Hyperlipidemia, unspecified: Secondary | ICD-10-CM | POA: Diagnosis not present

## 2021-09-25 DIAGNOSIS — E559 Vitamin D deficiency, unspecified: Secondary | ICD-10-CM | POA: Diagnosis not present

## 2021-09-25 NOTE — Progress Notes (Signed)
? ?Future Appointments  ?Date Time Provider Department  ?09/25/2021  2:00 PM Unk Pinto, MD [facility]  ?10/21/2021                   3 mo OV 11:00 AM [name], NP [facility]  ?04/08/2022                 CPE 10:00 AM Unk Pinto, MD [facility]  ?07/16/2022                   Wellness 11:00 AM [name], NP [facility]  ? ? ?History of Present Illness: ? ?   3 weeks ago on Mar 9, patient had a Left L3-4 Fusion by Dr Alvina Chou and apparently is doing well form the surgery.  She presents today to review & discuss her meds as to whether  she  may be taking duplicate medications or possible med interactions.  ? ? ?Medications ? ?Current Outpatient Medications (Endocrine & Metabolic):  ?  dexamethasone (DECADRON) 0.5 MG/5ML solution, Take 0.5 mg by mouth in the morning, at noon, and at bedtime. Swish for 3 minutes then spit. ?  levothyroxine88 MCG tablet, Take  1 tablet  Daily  on an empty stomach with only water for 30 minutes & no Antacid meds, Calcium or Magnesium for 4 hours & avoid Biotin ? ?Current Outpatient Medications (Cardiovascular):  ?  bisoprolol (ZEBETA) 5 MG tablet, Take  1/2 to 1  tablet  every Morning  for BP (Patient taking differently: Take 2.5 mg by mouth daily.) ?  fenofibrate micronized (LOFIBRA) 134 MG capsule, Take 134 mg by mouth daily before breakfast. ?  olmesartan (BENICAR) 40 MG tablet, Take 20 mg by mouth daily. ? ?Current Outpatient Medications (Respiratory):  ?  benzonatate (TESSALON) 200 MG capsule, TAKE 1 CAP THREE TIMES DAILY AS NEEDED FOR COUGH. ?  fluticasone (FLONASE) 50 MCG/ACT nasal spray, USE TWO SPRAYS IN EACH NOSTRIL DAILY AS NEEDED (Patient taking differently: 2 sprays at bedtime.) ?  guaiFENesin (MUCINEX) 600 MG 12 hr tablet, Take 600 mg by mouth daily. ? ?Current Outpatient Medications (Analgesics):  ?  oxyCODONE-acetaminophen (PERCOCET) 10-325 MG tablet, Take 1-2 tablets by mouth every 6 (six) hours as needed for pain. ? ?Current Outpatient Medications  (Hematological):  ?  Ferrous Sulfate (IRON PO), Take 1 tablet by mouth daily. ?  vitamin B-12 (CYANOCOBALAMIN) 500 MCG tablet, Take 500 mcg by mouth daily. ? ?Current Outpatient Medications (Other):  ?  buPROPion (WELLBUTRIN XL) 300 MG 24 hr tablet, Take  1 tablet  Daily  for Mood, Focus &  Concentration (Patient taking differently: Take 300 mg by mouth daily.) ?  calcium carbonate (OS-CAL) 1250 (500 Ca) MG chewable tablet, Chew 2 tablets by mouth daily. ?  carboxymethylcellulose (REFRESH PLUS) 0.5 % SOLN, Place 1 drop into both eyes 3 (three) times daily as needed (dry eyes). ?  Cholecalciferol 125 MCG (5000 UT) TABS, Take 5,000 Units by mouth daily. ?  DULoxetine (CYMBALTA) 60 MG capsule, Take 1 capsule Daily for Chronic Pain (Patient taking differently: Take 60 mg by mouth daily with supper.) ?  fluconazole (DIFLUCAN) 150 MG tablet, Take 150 mg by mouth daily. ?  gabapentin (NEURONTIN) 300 MG capsule, TAKE 1 CAPSULE BY MOUTH THREE TIMES A DAY ?  hydrocortisone 2.5 % ointment, Apply 1 application topically daily as needed (itching). ?  Lactobacillus (PROBIOTIC ACIDOPHILUS PO), Take 1 tablet by mouth daily. ?  meclizine (ANTIVERT) 25 MG tablet, TAKE 1 TABLET (25  MG TOTAL) BY MOUTH 3 (THREE) TIMES DAILY AS NEEDED FOR DIZZINESS. ?  methocarbamol (ROBAXIN) 500 MG tablet, Take 1 tablet (500 mg total) by mouth 4 (four) times daily. ?  Multiple Vitamin (MULTIVITAMIN WITH MINERALS) TABS tablet, Take 1 tablet by mouth daily. ?  neomycin-polymyxin-hydrocortisone (CORTISPORIN) 3.5-10000-1 OTIC suspension, Instill  6 to 8 drops  to affected ear  3 to 4 x /day  and occlude with cotton (Patient taking differently: As needed for ear infections: Instill  6 to 8 drops  to affected ear  3 to 4 x /day  and occlude with cotton) ?  omeprazole (PRILOSEC) 40 MG capsule, Take 40 mg by mouth daily. ?  oxybutynin (DITROPAN) 5 MG tablet, TAKE 1 TABLET TWICE A DAY FOR BLADDER CONTROL ?  polyethylene glycol (MIRALAX / GLYCOLAX) 17 g packet,  Take 17-34 g by mouth daily. Dose depends on degree of constipation ?  rOPINIRole (REQUIP) 2 MG tablet, TAKE 1 TABLET FOUR TIMES A DAY FOR RESTLESS LEGS (Patient taking differently: Take 1 mg by mouth See admin instructions. Take 1 tablet in AM, 1 tablet at dinner time, and 2 tablets at bedtime.) ?  senna-docusate (SENOKOT-S) 8.6-50 MG tablet, Take 2 tablets by mouth daily as needed for mild constipation. ?  Wheat Dextrin (BENEFIBER DRINK MIX PO), Take 1 Scoop by mouth daily. ? ?Problem list ?She has Obstructive sleep apnea; RESTLESS LEG SYNDROME; Essential hypertension; Rhinitis; GERD; Crohn's Enterocolitis / Regional enteritis; Fibromyalgia; Gait disorder; Hyperlipidemia associated with type 2 diabetes mellitus ([place]); Hypothyroid; Depression, major, recurrent, in partial remission ([place]); Vitamin D deficiency; ASCVD; Medication management; Rotator cuff arthropathy; Obesity (BMI 30.0-34.9); Spinal stenosis in cervical region; IBS (irritable colon syndrome); Sleep talking; B12 deficiency anemia; Polypharmacy; Bronchiectasis without complication ([place]); Arthritis of carpometacarpal (CMC) joints of both thumbs; Bilateral bunions; Cervical disc disease; Constipation; Crohn's ileitis ([place]); Rotator cuff tear; Diverticulosis; History of adenomatous polyp of colon; Internal hemorrhoids; Knee pain; Other specified arthropathy involving shoulder region; S/P revision of total knee, left; Diet-controlled type 2 diabetes mellitus ([place]); Rupture of popliteal cyst of right knee region (complex cyst); History of recurrent TIAs; COVID-19 virus infection; CKD stage 2 due to type 2 diabetes mellitus ([place]); History of total knee arthroplasty, right; Pain due to total right knee replacement ([place]); Presence of artificial knee joint, right; Aortic atherosclerosis ([place]) by Chest CT scan on 09/2017; and Lumbar spinal stenosis on their problem list. ?  ?Observations/Objective: ? ?BP 110/60   Pulse 80   Temp 97.9 ?F (36.6 ?C)   Resp 16    Ht 5' 3"  (1.6 m)   Wt 138 lb 6.4 oz (62.8 kg)   SpO2 97%   BMI 24.52 kg/m?  ? ?HEENT - WNL. ?Neck - supple.  ?Chest - Clear equal BS. ?Cor - Nl HS. RRR w/o sig MGR. PP 1(+). No edema. ?MS- FROM w/o deformities.  Gait Nl. ?Neuro -  Nl w/o focal abnormalities. ? ? ?Assessment and Plan: ? ?1. Labile hypertension ? ? ?2. Hyperlipidemia associated with type 2 diabetes mellitus ([place]) ? ? ?3. Type 2 diabetes mellitus with stage 2 chronic kidney disease  ([place]) ? ? ?4. Vitamin D deficiency ? ? ? ?Follow Up Instructions: ? ?  ?     All meds - individually & in complete  were reviewed with patient any potential drug interactions were rI discussed the assessment and treatment plan with the patient. The patient was provided an opportunity to ask questions and all were answered. The patient agreed with the  plan and demonstrated an understanding of the instructions. Approx 20 minutes of counseling,  chart review and critical decision making was performed.                                                                                                                                                                                                                          ? ?  ?    The patient was advised to call back or seek an in-person evaluation if the symptoms worsen or if the condition fails to improve as anticipated. ? ? ? ?Kirtland Bouchard, MD ? ?

## 2021-10-01 DIAGNOSIS — M180 Bilateral primary osteoarthritis of first carpometacarpal joints: Secondary | ICD-10-CM | POA: Diagnosis not present

## 2021-10-01 DIAGNOSIS — G5601 Carpal tunnel syndrome, right upper limb: Secondary | ICD-10-CM | POA: Diagnosis not present

## 2021-10-01 DIAGNOSIS — M19042 Primary osteoarthritis, left hand: Secondary | ICD-10-CM | POA: Diagnosis not present

## 2021-10-17 ENCOUNTER — Other Ambulatory Visit: Payer: Self-pay | Admitting: Nurse Practitioner

## 2021-10-19 NOTE — Progress Notes (Signed)
?3 MONTH FOLLOW UP ? ?Assessment:  ? ?Essential hypertension ?-well controlled currently ?-monitor at home ?-dash diet ?-exercise as tolerated ?-CBC ? ?Overactive bladder ?Stop oxybutynin and start Myrbetriq 24m qd ?Samples given, if likes medication message and will send in a script ? ?Aortic Atherosclerosis(HCC)/ASCVD ?-cont to maximize cholesterol management and BP management ? ? Obstructive sleep apnea ?-emphasized adherence with CPAP  ? ? Allergic rhinitis, unspecified allergic rhinitis type ?-cont meds - suggested she rotate antihistamine q3-6 months ? ?Lifestyle controlled diabetes ([place] ?Discussed general issues about diabetes pathophysiology and management., Educational material distributed., Suggested low cholesterol diet., Encouraged aerobic exercise., Discussed foot care., Reminded to get yearly retinal exam.  ?- Hemoglobin A1c at routine OVs in office q358m ? Hypothyroidism, unspecified hypothyroidism type ?-check TSH level, continue medications the same, reminded to take on an empty stomach 30-6066m before food.  ?- TSH ? ? Hyperlipidemia associated with T2DM (HCC)/Statin Myopathy ?- statin intolerant, on fenofibrate and lopid with recent increased dose ?-continue medications, check lipids, decrease fatty foods, increase activity.  ?-  Check Lipid panel ?- Check CMP ? ?CKD 2 associated with T2DM (HCCRiddlevilleIncrease fluids, avoid NSAIDS, monitor sugars, will monitor ? ?Anemia ?Check Iron, total and TIBC ? ?Vitamin D deficiency ?-cont supplement ? ?Medication management ?- CBC, CMP/GFR ? ?Gastroesophageal reflux disease, esophagitis presence not specified ?-cont meds ? ?Crohn's disease with complication, unspecified gastrointestinal tract location ([facility]-followed by Dr. MedEarlean Shawlmproved with humira ? ? IBS (irritable colon syndrome) ?-followed by Dr. MedEarlean Shawl?Fibromyalgia ?-followed by Dr. HarMaryjean Ka? RESTLESS LEG SYNDROME ?-followed by Dr. HarMaryjean Kaontinue requip ? ? Depression, remission partial,  recurrent ([place]CC[place]-cont current meds ? ? Cervical pain ?-followed by neurosurgery ? ?Spinal stenosis in cervical region ?-followed by neurosurgery ? ? ?Insomnia, unspecified type ?Sleep hygiene discussed ? ?BMI 24 ?Long discussion about weight loss, diet, and exercise ?Discussed goal weight and initial weight loss goal (<170lb) ?Patient will work on limiting access to unhealthy snacking foods in the evening, have easily accessible "healthy" options ? ?Bronchiectasis without complication (HCC[place]iverpoolMonitor; continue Mucinex ? ?Polypharmacy ? Continue regular med reviews and d/c meds when possible.  ?Will have pt [facility] up [facility] ? ? ?Over 30 minutes of exam, counseling, chart review, and critical decision making was performed ? ?Future Appointments  ?Date Time Provider DepHernando4/24/2023  2:00 PM LBPQuitmanne  ?04/08/2022 10:00 AM McKUnk PintoD [facility] None  ?07/16/2022 11:00 AM MulMagda BernheimP [facility] None  ? ? ? ? ? ?Subjective:  ? ?[name] a [age]o. female who presents for 3 month OV for diabetes, htn, hyperlipidemia, CKD, vitamin D. ? ?She has been having constipation since 3 days ago.  She is using Miralax, prunes and prune juice.  Will then alternate with diarrhea. She has Crohn's managed by Dr. MedEarlean Shawlas on humira without recent flares. She is currently off Humira since she has surgery. Has colonoscopy in June. ? ?She has a Chronic Pain Syndrome consequent of multiple Neck & back surgeries & [facility] - [facility]ol, takes intermittently.  She had MINIMALLY INVASIVE TRANSFORAMINAL LUMBAR INTERBODY FUSION LUMBAR THREE-FOUR; EXPLORE FUSION WITH REVISION OF HARDWARE LUMBAR FOUR-FIVE,LEFT SIDE APPROACH on 09/05/21. She is healing well.  Less pain noted in lower back.  ? ?She is on cymbalta for choronic pain, wellbutrin for mood and focus. Reports some seasonal down  days but  overall improved since son passed back in 2012. ? ?She has frequent falls, impaired gait and complex ortho hx, patient was seen by [facility] neurologist Dr. Orvilla Fus and in process of workup with EMG/NCV and MRI.  ? ?12/23/20 She had right knee surgery for hole in the knee cap.  Continues to have some pain in that knee. She continues to have pain in right knee and creaking.  ? ?She is going to have her left middle finger surgery has arthritis and pain in the finger. ? ?On oxybutynin 5 mg BID due to OAB/ICS with benefit, hasn't been able to tolerate tapering per patient.  ? 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  ? ? ?She does have bed rails as she has had episodes of falling out of bed. ? ?She is going today to neurology and is going to  set up home sleep test. Patient has OSA & does not use her CPAP/BiPAP. ? ?BMI is Body mass index is 23.95 kg/m?., she has been working on diet, Exercise is limited due to ongoing multiple orthopedic concerns has had multiple falls this past year, neuro and ortho following, has seen PT, doing exercises athome  ?Wt Readings from Last 3 Encounters:  ?10/21/21 135 lb 3.2 oz (61.3 kg)  ?09/25/21 138 lb 6.4 oz (62.8 kg)  ?09/05/21 138 lb 4.8 oz (62.7 kg)  ? ?Her blood pressure has been controlled at home, today their BP is BP: 122/62 ?BP Readings from Last 3 Encounters:  ?10/21/21 122/62  ?09/25/21 110/60  ?09/06/21 114/67  ?  ?She does not workout, she has upcoming shoulder replacement, seeing specialist.  ? She denies chest pain, shortness of breath, dizziness.  ? ?She is on Fenofibrate and denies myalgias. Her cholesterol is not at goal of LDL <70. When she took Statin in the past she had sharp pain in her calves.The cholesterol last visit was:   ?Lab Results  ?Component Value Date  ? CHOL 271 (H) 07/16/2021  ? HDL 57 07/16/2021  ? LDLCALC 173 (H) 07/16/2021  ? TRIG 250 (H) 07/16/2021  ? CHOLHDL 4.8 07/16/2021  ? ?She has been working  on diet for lifestyle controlled diabetes (A1C 6.6%, 6.7% in 2016), and denies foot ulcerations, increased appetite, nausea, paresthesia of the feet, polydipsia, polyuria, visual disturbances, vomiting and weight loss.  ?Last A1C in the office was:  ?Lab Results  ?Component Value Date  ? HGBA1C 5.7 (H) 07/16/2021  ? ?She has CKD II associated with lifestyle controlled diabetes, on olmesartan:  ?Lab Results  ?Component Value Date  ? GFRNONAA >60 09/03/2021  ? ?Patient is on Vitamin D supplement, at goal last visit.  ?Lab Results  ?Component Value Date  ? VD25OH 80 04/08/2021  ?   ?She is on thyroid medication. Her medication was not changed last visit. Taking 88 mcg tab daily.   ?Lab Results  ?Component Value Date  ? TSH 0.12 (L) 07/16/2021  ?.  ? ? ? ?Medication Review ?Current Outpatient Medications on File Prior to Visit  ?Medication Sig  ? bisoprolol (ZEBETA) 5 MG tablet Take  1/2 to 1  tablet  every Morning  for BP (Patient taking differently: Take 2.5 mg by mouth daily.)  ? buPROPion (WELLBUTRIN XL) 300 MG 24 hr tablet Take  1 tablet  Daily  for Mood, Focus &  Concentration (Patient taking differently: Take 300 mg by mouth daily.)  ? calcium carbonate (OS-CAL) 1250 (500 Ca) MG chewable tablet Chew 2  tablets by mouth daily.  ? carboxymethylcellulose (REFRESH PLUS) 0.5 % SOLN Place 1 drop into both eyes 3 (three) times daily as needed (dry eyes).  ? Cholecalciferol 125 MCG (5000 UT) TABS Take 5,000 Units by mouth daily.  ? DULoxetine (CYMBALTA) 60 MG capsule Take 1 capsule Daily for Chronic Pain (Patient taking differently: Take 60 mg by mouth daily with supper.)  ? fenofibrate micronized (LOFIBRA) 134 MG capsule Take 134 mg by mouth daily before breakfast.  ? Ferrous Sulfate (IRON PO) Take 1 tablet by mouth daily.  ? fluconazole (DIFLUCAN) 150 MG tablet Take 150 mg by mouth daily.  ? fluticasone (FLONASE) 50 MCG/ACT nasal spray USE TWO SPRAYS IN EACH NOSTRIL DAILY AS NEEDED (Patient taking differently: 2 sprays  at bedtime.)  ? gabapentin (NEURONTIN) 300 MG capsule TAKE 1 CAPSULE BY MOUTH THREE TIMES A DAY  ? hydrocortisone 2.5 % ointment Apply 1 application topically daily as needed (itching).  ? Lactobacillus (P

## 2021-10-21 ENCOUNTER — Ambulatory Visit (INDEPENDENT_AMBULATORY_CARE_PROVIDER_SITE_OTHER): Payer: Medicare PPO | Admitting: Nurse Practitioner

## 2021-10-21 ENCOUNTER — Encounter: Payer: Self-pay | Admitting: Nurse Practitioner

## 2021-10-21 ENCOUNTER — Ambulatory Visit: Payer: Medicare PPO

## 2021-10-21 VITALS — BP 122/62 | HR 81 | Temp 97.5°F | Wt 135.2 lb

## 2021-10-21 DIAGNOSIS — N182 Chronic kidney disease, stage 2 (mild): Secondary | ICD-10-CM

## 2021-10-21 DIAGNOSIS — E663 Overweight: Secondary | ICD-10-CM

## 2021-10-21 DIAGNOSIS — I10 Essential (primary) hypertension: Secondary | ICD-10-CM | POA: Diagnosis not present

## 2021-10-21 DIAGNOSIS — R634 Abnormal weight loss: Secondary | ICD-10-CM

## 2021-10-21 DIAGNOSIS — F3341 Major depressive disorder, recurrent, in partial remission: Secondary | ICD-10-CM

## 2021-10-21 DIAGNOSIS — I700 Atherosclerosis of aorta: Secondary | ICD-10-CM

## 2021-10-21 DIAGNOSIS — M797 Fibromyalgia: Secondary | ICD-10-CM

## 2021-10-21 DIAGNOSIS — E785 Hyperlipidemia, unspecified: Secondary | ICD-10-CM

## 2021-10-21 DIAGNOSIS — E039 Hypothyroidism, unspecified: Secondary | ICD-10-CM

## 2021-10-21 DIAGNOSIS — E559 Vitamin D deficiency, unspecified: Secondary | ICD-10-CM

## 2021-10-21 DIAGNOSIS — G4700 Insomnia, unspecified: Secondary | ICD-10-CM

## 2021-10-21 DIAGNOSIS — K5000 Crohn's disease of small intestine without complications: Secondary | ICD-10-CM

## 2021-10-21 DIAGNOSIS — K589 Irritable bowel syndrome without diarrhea: Secondary | ICD-10-CM

## 2021-10-21 DIAGNOSIS — E1122 Type 2 diabetes mellitus with diabetic chronic kidney disease: Secondary | ICD-10-CM

## 2021-10-21 DIAGNOSIS — J479 Bronchiectasis, uncomplicated: Secondary | ICD-10-CM | POA: Diagnosis not present

## 2021-10-21 DIAGNOSIS — T466X5A Adverse effect of antihyperlipidemic and antiarteriosclerotic drugs, initial encounter: Secondary | ICD-10-CM

## 2021-10-21 DIAGNOSIS — J309 Allergic rhinitis, unspecified: Secondary | ICD-10-CM

## 2021-10-21 DIAGNOSIS — D508 Other iron deficiency anemias: Secondary | ICD-10-CM

## 2021-10-21 DIAGNOSIS — M4802 Spinal stenosis, cervical region: Secondary | ICD-10-CM

## 2021-10-21 DIAGNOSIS — Z8669 Personal history of other diseases of the nervous system and sense organs: Secondary | ICD-10-CM

## 2021-10-21 DIAGNOSIS — N3281 Overactive bladder: Secondary | ICD-10-CM

## 2021-10-21 DIAGNOSIS — E1169 Type 2 diabetes mellitus with other specified complication: Secondary | ICD-10-CM | POA: Diagnosis not present

## 2021-10-21 DIAGNOSIS — G720 Drug-induced myopathy: Secondary | ICD-10-CM

## 2021-10-21 DIAGNOSIS — S41112A Laceration without foreign body of left upper arm, initial encounter: Secondary | ICD-10-CM

## 2021-10-21 DIAGNOSIS — Z79899 Other long term (current) drug therapy: Secondary | ICD-10-CM

## 2021-10-21 MED ORDER — BENZONATATE 200 MG PO CAPS: ORAL_CAPSULE | ORAL | 1 refills | Status: DC

## 2021-10-22 LAB — COMPLETE METABOLIC PANEL WITH GFR
AG Ratio: 1.7 (calc) (ref 1.0–2.5)
ALT: 9 U/L (ref 6–29)
AST: 18 U/L (ref 10–35)
Albumin: 4.3 g/dL (ref 3.6–5.1)
Alkaline phosphatase (APISO): 64 U/L (ref 37–153)
BUN/Creatinine Ratio: 33 (calc) — ABNORMAL HIGH (ref 6–22)
BUN: 27 mg/dL — ABNORMAL HIGH (ref 7–25)
CO2: 29 mmol/L (ref 20–32)
Calcium: 10.5 mg/dL — ABNORMAL HIGH (ref 8.6–10.4)
Chloride: 103 mmol/L (ref 98–110)
Creat: 0.81 mg/dL (ref 0.60–1.00)
Globulin: 2.6 g/dL (calc) (ref 1.9–3.7)
Glucose, Bld: 105 mg/dL — ABNORMAL HIGH (ref 65–99)
Potassium: 5.5 mmol/L — ABNORMAL HIGH (ref 3.5–5.3)
Sodium: 141 mmol/L (ref 135–146)
Total Bilirubin: 0.6 mg/dL (ref 0.2–1.2)
Total Protein: 6.9 g/dL (ref 6.1–8.1)
eGFR: 77 mL/min/{1.73_m2} (ref >=60)

## 2021-10-22 LAB — IRON, TOTAL/TOTAL IRON BINDING CAP
%SAT: 33 % (calc) (ref 16–45)
Iron: 138 ug/dL (ref 45–160)
TIBC: 422 mcg/dL (calc) (ref 250–450)

## 2021-10-22 LAB — CBC WITH DIFFERENTIAL/PLATELET
Absolute Monocytes: 799 cells/uL (ref 200–950)
Basophils Absolute: 94 cells/uL (ref 0–200)
Basophils Relative: 1.3 %
Eosinophils Absolute: 259 cells/uL (ref 15–500)
Eosinophils Relative: 3.6 %
HCT: 43.2 % (ref 35.0–45.0)
Hemoglobin: 14 g/dL (ref 11.7–15.5)
Lymphs Abs: 1778 cells/uL (ref 850–3900)
MCH: 28.7 pg (ref 27.0–33.0)
MCHC: 32.4 g/dL (ref 32.0–36.0)
MCV: 88.5 fL (ref 80.0–100.0)
MPV: 10.2 fL (ref 7.5–12.5)
Monocytes Relative: 11.1 %
Neutro Abs: 4270 cells/uL (ref 1500–7800)
Neutrophils Relative %: 59.3 %
Platelets: 318 10*3/uL (ref 140–400)
RBC: 4.88 10*6/uL (ref 3.80–5.10)
RDW: 13 % (ref 11.0–15.0)
Total Lymphocyte: 24.7 %
WBC: 7.2 10*3/uL (ref 3.8–10.8)

## 2021-10-22 LAB — LIPID PANEL
Cholesterol: 165 mg/dL (ref <200)
HDL: 51 mg/dL (ref >=50)
LDL Cholesterol (Calc): 90 mg/dL (calc)
Non-HDL Cholesterol (Calc): 114 mg/dL (calc) (ref <130)
Total CHOL/HDL Ratio: 3.2 (calc) (ref <5.0)
Triglycerides: 141 mg/dL (ref <150)

## 2021-10-22 LAB — TSH: TSH: 1.18 mIU/L (ref 0.40–4.50)

## 2021-10-23 ENCOUNTER — Other Ambulatory Visit: Payer: Self-pay | Admitting: Internal Medicine

## 2021-11-04 ENCOUNTER — Other Ambulatory Visit: Payer: Self-pay | Admitting: Adult Health

## 2021-11-04 ENCOUNTER — Other Ambulatory Visit: Payer: Self-pay | Admitting: Internal Medicine

## 2021-11-04 DIAGNOSIS — M5416 Radiculopathy, lumbar region: Secondary | ICD-10-CM | POA: Diagnosis not present

## 2021-11-04 DIAGNOSIS — M797 Fibromyalgia: Secondary | ICD-10-CM

## 2021-11-05 DIAGNOSIS — J343 Hypertrophy of nasal turbinates: Secondary | ICD-10-CM | POA: Diagnosis not present

## 2021-11-05 DIAGNOSIS — G4733 Obstructive sleep apnea (adult) (pediatric): Secondary | ICD-10-CM | POA: Diagnosis not present

## 2021-11-05 DIAGNOSIS — J310 Chronic rhinitis: Secondary | ICD-10-CM | POA: Diagnosis not present

## 2021-11-06 ENCOUNTER — Telehealth: Payer: Self-pay | Admitting: Internal Medicine

## 2021-11-06 ENCOUNTER — Ambulatory Visit: Payer: Medicare PPO

## 2021-11-06 DIAGNOSIS — G4733 Obstructive sleep apnea (adult) (pediatric): Secondary | ICD-10-CM | POA: Diagnosis not present

## 2021-11-06 NOTE — Progress Notes (Signed)
?  Chronic Care Management  ? ?Note ? ?11/06/2021 ?Name: Crystal [name] MRN: [id number] DOB: 11/12/1950 ? ?Crystal [name] is a 71 y.o. year old female who is a primary care patient of Unk Pinto, MD. I reached out to [name] by phone today in response to a referral sent by [name]'s PCP, Unk Pinto, MD.  ? ?[name] was given information about Chronic Care Management services today including:  ?CCM service includes personalized support from designated clinical staff supervised by her physician, including individualized plan of care and coordination with other care providers ?24/7 contact phone numbers for assistance for urgent and routine care needs. ?Service will only be billed when office clinical staff spend 20 minutes or more in a month to coordinate care. ?Only one practitioner may furnish and bill the service in a calendar month. ?The patient may stop CCM services at any time (effective at the end of the month) by phone call to the office staff. ? ? ?Patient agreed to services and verbal consent obtained.  ? ?Follow up plan:REFERRAL MESSAGED CPP/ PT AWARE COPAY ? ? ?Tatjana Dellinger ?Upstream Scheduler  ?

## 2021-11-06 NOTE — Progress Notes (Signed)
?  Chronic Care Management  ? ?Outreach Note ? ?11/06/2021 ?Name: Beatric Fulop MRN: [id number] DOB: December 21, 1950 ? ?Referred by: Unk Pinto, MD ?Reason for referral : No chief complaint on file. ? ? ?An unsuccessful telephone outreach was attempted today. The patient was referred to the pharmacist for assistance with care management and care coordination.  ? ?Follow Up Plan:  ?Referral message/ pt has copay ? ? ?Tatjana Dellinger ?Upstream Scheduler  ?

## 2021-11-06 NOTE — Telephone Encounter (Addendum)
patient called for referral back to Rheumatology, Dr. Ceasar Lund, lost to follow up during [place]. Patient states called specialist and new referral is required. Per Dr. Unk Pinto, send referral for dx M79.7, M19.041, M19.042 to specialty office.  ?

## 2021-11-07 ENCOUNTER — Telehealth: Payer: Self-pay | Admitting: Pulmonary Disease

## 2021-11-07 DIAGNOSIS — M19049 Primary osteoarthritis, unspecified hand: Secondary | ICD-10-CM | POA: Diagnosis not present

## 2021-11-07 DIAGNOSIS — M19041 Primary osteoarthritis, right hand: Secondary | ICD-10-CM | POA: Diagnosis not present

## 2021-11-07 DIAGNOSIS — G4733 Obstructive sleep apnea (adult) (pediatric): Secondary | ICD-10-CM | POA: Diagnosis not present

## 2021-11-07 DIAGNOSIS — M19042 Primary osteoarthritis, left hand: Secondary | ICD-10-CM | POA: Diagnosis not present

## 2021-11-07 NOTE — Telephone Encounter (Signed)
HST 11/06/21 >> AHI 36.5, SpO2 low 73% ? ? ?Please inform her that her sleep study shows severe obstructive sleep apnea.  Please arrange for ROV with me or NP to discuss treatment options. ? ? ? ?

## 2021-11-08 NOTE — Telephone Encounter (Signed)
Called and went over results with patient. Scheduled her an OV with [place] in [place] office for next Tuesday 5/15. Nothing further needed  ?

## 2021-11-11 ENCOUNTER — Encounter: Payer: Self-pay | Admitting: Nurse Practitioner

## 2021-11-11 ENCOUNTER — Ambulatory Visit: Payer: Medicare PPO | Admitting: Nurse Practitioner

## 2021-11-11 VITALS — BP 124/78 | HR 105 | Ht 63.0 in | Wt 136.8 lb

## 2021-11-11 DIAGNOSIS — G4733 Obstructive sleep apnea (adult) (pediatric): Secondary | ICD-10-CM

## 2021-11-11 DIAGNOSIS — J479 Bronchiectasis, uncomplicated: Secondary | ICD-10-CM | POA: Diagnosis not present

## 2021-11-11 DIAGNOSIS — Z789 Other specified health status: Secondary | ICD-10-CM

## 2021-11-11 NOTE — Assessment & Plan Note (Addendum)
Compensated on current regimen.  Continue as needed antitussives and mucolytics. ?

## 2021-11-11 NOTE — Progress Notes (Signed)
? ?@Patient  ID: Crystal [name], female    DOB: [birth year], 71 y.o.   MRN: [id number] ? ?Chief Complaint  ?Patient presents with  ? Follow-up  ?  Discuss HST 11/06/21  ? ? ?Referring provider: ?Unk Pinto, MD ? ?HPI: ?71 year old female, never smoker followed for OSA intolerant of CPAP and bronchiectasis.  She is a patient of Dr. Juanetta Gosling and last seen in office on 08/20/2021.  Past medical history significant for hypertension, atherosclerosis, HLD, DM 2, GERD, OA, RLS, fibromyalgia, MDD, vitamin D deficiency.  She is followed by Dr. Lucia Gaskins with ENT for chronic rhinitis and with narrow nasal angles and alar collapse; awaiting sinus surgery. ? ?TEST/EVENTS:  ?11/21/2015 PFTs: FEV1 2.51 (110%), FEV1% 85%, TLC 88%, DLCO 77% ?12/04/2015 HST: AHI 30.7, SPO2 low 73%.  Severe obstructive sleep apnea. ?10/01/2017 CT chest: 3 mm left lower lobe nodule, mild cylindrical BTX in the right side. ?11/06/2021 HST: AHI 36.5, SPO2 low 73%.  Severe obstructive sleep apnea. ? ?08/20/2021: OV with Dr. Halford Chessman.  Breathing overall stable.  Advised to continue as needed antitussives and expectorants for regional bronchiectasis in setting of reflux.  Patient wanted to know more information about inspire device that she has been intolerant of CPAP therapy. Has lost significant weight and feels like she is sleeping better.  Arranged for home sleep study for further evaluation of ongoing OSA.   ? ?11/11/2021: Today-follow-up ?Patient presents today to discuss home sleep study results which showed that she still has severe obstructive sleep apnea.  She has been intolerant of CPAP therapy in the past due to her ongoing nasal problems.  She has tried multiple masks including fullface, nasal mask and nasal pillow.  She has tried wearing the machine numerous times but ends up taking it off halfway through the night.  Wants to move forward with inspire device if possible.  She denies any drowsy driving, morning headaches, narcolepsy or cataplexy.   RLS is well controlled on Requip.  Reports that breathing is overall stable; has not had any flares recently. ? ?Allergies  ?Allergen Reactions  ? Codeine Other (See Comments)  ?  Headache  ? Statins Other (See Comments)  ?  Extreme pain  ? Amitriptyline Other (See Comments)  ?  Confusion and hallucinations  ? Erythromycin Base Rash  ?  Flushing/red face.  ? Fish Oil Nausea Only  ? Flax Seed [Bio-Flax] Rash  ? Flaxseed (Linseed) Rash  ? Hydrocodone Itching and Rash  ?   ?  ? Morphine Nausea And Vomiting and Rash  ? Nsaids Nausea Only  ? Nuvigil [Armodafinil] Other (See Comments)  ?  Unspecified  ? Sertraline Rash  ? Sinemet [Carbidopa W-Levodopa] Rash  ? Sulfa Antibiotics Rash  ? Tolmetin Nausea Only  ? ? ?Immunization History  ?Administered Date(s) Administered  ? DT (Pediatric) 01/03/2015  ? Influenza Split 03/30/2010, 03/31/2011, 05/14/2014, 02/27/2016, 04/16/2018  ? Influenza Whole 03/30/2010  ? Influenza, High Dose Seasonal PF 03/13/2015, 07/21/2019, 02/16/2020, 04/08/2021  ? Influenza-Unspecified 03/31/2011, 05/14/2014, 02/27/2016  ? PFIZER Comirnaty(Gray Top)Covid-19 Tri-Sucrose Vaccine 09/19/2019, 10/10/2019, 07/13/2020  ? PFIZER(Purple Top)SARS-COV-2 Vaccination 09/19/2019, 10/10/2019  ? Pension scheme manager 12yr & up 05/14/2021  ? Pneumococcal Conjugate-13 06/30/2005, 04/17/2015, 01/23/2019  ? Pneumococcal Polysaccharide-23 01/06/2017  ? Pneumococcal-Unspecified 06/30/2005  ? Td 06/30/2004  ? Td,absorbed, Preservative Free, Adult Use, Lf Unspecified 06/30/2004  ? Zoster Recombinat (Shingrix) 05/14/2021, 08/22/2021  ? Zoster, Live 07/01/2007  ? ? ?Past Medical History:  ?Diagnosis Date  ? Arthritis   ? ASCVD (  arteriosclerotic cardiovascular disease)   ? Closed nondisplaced subtrochanteric fracture of left femur ([place]) 09/03/2017  ? September 2018.  Status post surgery by Dr. Marcelino Scot  ? Depression   ? Dyspnea   ? Fibromyalgia   ? Gait disorder 10/26/2012  ? GERD (gastroesophageal reflux  disease)   ? GI bleed   ? Hyperlipidemia   ? Hypertension   ? Hypothyroid   ? Periprosthetic supracondylar fracture of femur, left  03/23/2017  ? Pneumonia   ? Pre-diabetes   ? Restless leg syndrome   ? Rhinitis 06/25/2010  ? Sleep apnea   ? Spinal headache   ? with the C section  ? TIA (transient ischemic attack)   ? 3        ? Vitamin D deficiency   ? ? ?Tobacco History: ?Social History  ? ?Tobacco Use  ?Smoking Status Never  ?Smokeless Tobacco Never  ? ?Counseling given: Not Answered ? ? ?Outpatient Medications Prior to Visit  ?Medication Sig Dispense Refill  ? benzonatate (TESSALON) 200 MG capsule TAKE 1 CAP THREE TIMES DAILY AS NEEDED FOR COUGH. 30 capsule 1  ? bisoprolol (ZEBETA) 5 MG tablet Take  1/2 to 1  tablet  every Morning  for BP (Patient taking differently: Take 2.5 mg by mouth daily.) 90 tablet 3  ? buPROPion (WELLBUTRIN XL) 300 MG 24 hr tablet Take  1 tablet  Daily  for Mood, Focus &  Concentration (Patient taking differently: Take 300 mg by mouth daily.) 90 tablet 3  ? calcium carbonate (OS-CAL) 1250 (500 Ca) MG chewable tablet Chew 2 tablets by mouth daily.    ? carboxymethylcellulose (REFRESH PLUS) 0.5 % SOLN Place 1 drop into both eyes 3 (three) times daily as needed (dry eyes).    ? Cholecalciferol 125 MCG (5000 UT) TABS Take 5,000 Units by mouth daily.    ? dexamethasone 0.5 MG/5ML elixir Take by mouth.    ? DULoxetine (CYMBALTA) 60 MG capsule TAKE 1 CAPSULE DAILY FOR CHRONIC PAIN 90 capsule 3  ? fenofibrate micronized (LOFIBRA) 134 MG capsule Take 134 mg by mouth daily before breakfast.    ? Ferrous Sulfate (IRON PO) Take 1 tablet by mouth daily.    ? fluconazole (DIFLUCAN) 150 MG tablet Take 150 mg by mouth daily.    ? fluticasone (FLONASE) 50 MCG/ACT nasal spray USE TWO SPRAYS IN EACH NOSTRIL DAILY AS NEEDED (Patient taking differently: 2 sprays at bedtime.) 48 g 3  ? gabapentin (NEURONTIN) 300 MG capsule TAKE 1 CAPSULE BY MOUTH THREE TIMES A DAY 90 capsule 2  ? hydrocortisone 2.5 % ointment  Apply 1 application topically daily as needed (itching).    ? Lactobacillus (PROBIOTIC ACIDOPHILUS PO) Take 1 tablet by mouth daily.    ? levothyroxine (SYNTHROID) 88 MCG tablet Take  1 tablet  Daily  on an empty stomach with only water for 30 minutes & no Antacid meds, Calcium or Magnesium for 4 hours & avoid Biotin 90 tablet 3  ? meclizine (ANTIVERT) 25 MG tablet TAKE 1 TABLET (25 MG TOTAL) BY MOUTH 3 (THREE) TIMES DAILY AS NEEDED FOR DIZZINESS. 90 tablet 0  ? metroNIDAZOLE (FLAGYL) 500 MG tablet Take 500 mg by mouth 2 (two) times daily.    ? Multiple Vitamin (MULTIVITAMIN WITH MINERALS) TABS tablet Take 1 tablet by mouth daily.    ? neomycin-polymyxin-hydrocortisone (CORTISPORIN) 3.5-10000-1 OTIC suspension Instill  6 to 8 drops  to affected ear  3 to 4 x /day  and occlude with cotton (Patient taking  differently: As needed for ear infections: Instill  6 to 8 drops  to affected ear  3 to 4 x /day  and occlude with cotton) 10 mL 1  ? olmesartan (BENICAR) 40 MG tablet Take 20 mg by mouth daily.    ? omeprazole (PRILOSEC) 40 MG capsule Take 40 mg by mouth daily.    ? oxybutynin (DITROPAN) 5 MG tablet TAKE 1 TABLET TWICE A DAY FOR BLADDER CONTROL 180 tablet 3  ? polyethylene glycol (MIRALAX / GLYCOLAX) 17 g packet Take 17-34 g by mouth daily. Dose depends on degree of constipation    ? rOPINIRole (REQUIP) 2 MG tablet TAKE 1 TABLET FOUR TIMES A DAY FOR RESTLESS LEGS (Patient taking differently: Take 1 mg by mouth See admin instructions. Take 1 tablet in AM, 1 tablet at dinner time, and 2 tablets at bedtime.) 360 tablet 3  ? senna-docusate (SENOKOT-S) 8.6-50 MG tablet Take 2 tablets by mouth daily as needed for mild constipation.    ? traMADol (ULTRAM) 50 MG tablet Take by mouth.    ? vitamin B-12 (CYANOCOBALAMIN) 500 MCG tablet Take 500 mcg by mouth daily.    ? Wheat Dextrin (BENEFIBER DRINK MIX PO) Take 1 Scoop by mouth daily.    ? ?No facility-administered medications prior to visit.  ? ? ? ?Review of Systems:   ? ?Constitutional: No weight loss or gain, night sweats, fevers, chills, fatigue, or lassitude. ?HEENT: No headaches, difficulty swallowing, tooth/dental problems, or sore throat. No sneezing, itching, ear

## 2021-11-11 NOTE — Patient Instructions (Addendum)
Continue flonase 2 sprays each nostril daily  ?Continue tessalon perles (benzonatate) Three times a day as needed for cough ?Continue mucinex 600 mg Twice daily as needed for chest congestion ? ?Referral placed to ENT for evaluation for Inspire device placement. Please contact their office for scheduling.  ? ?Follow up in 3 months with Dr. Halford Chessman or Alanson Aly. If symptoms do not improve or worsen, please contact office for sooner follow up or seek emergency care. ?

## 2021-11-11 NOTE — Assessment & Plan Note (Signed)
Severe OSA with AHI 36.1 on most recent HST.  Intolerant to CPAP therapy previously.  Wants to proceed with inspire device placement.  Referral sent to ENT and patient provided with information to contact office.  We will plan to follow-up with her afterwards. We discussed how untreated sleep apnea puts an individual at risk for cardiac arrhthymias, pulm HTN, DM, stroke and increases their risk for daytime accidents.  ? ?Patient Instructions  ?Continue flonase 2 sprays each nostril daily  ?Continue tessalon perles (benzonatate) Three times a day as needed for cough ?Continue mucinex 600 mg Twice daily as needed for chest congestion ? ?Referral placed to ENT for evaluation for Inspire device placement. Please contact their office for scheduling.  ? ?Follow up in 3 months with Dr. Halford Chessman or Alanson Aly. If symptoms do not improve or worsen, please contact office for sooner follow up or seek emergency care. ? ? ?

## 2021-11-11 NOTE — Progress Notes (Signed)
Reviewed and agree with assessment/plan. ? ? ?[name], MD ?[place] ?11/11/2021, 5:48 PM ?Pager:  [phone] ? ?

## 2021-11-13 DIAGNOSIS — G255 Other chorea: Secondary | ICD-10-CM | POA: Diagnosis not present

## 2021-11-22 NOTE — Telephone Encounter (Signed)
We reviewed at her OV last week; results are documented in the note. She still has severe OSA with AHI 36.1 and wanted referral for Inspire device, which I sent to ENT. If she would like a copy of her sleep study, we can print her off one that she can pick up. I'm not sure it will show up in her MyChart

## 2021-12-11 DIAGNOSIS — S0990XA Unspecified injury of head, initial encounter: Secondary | ICD-10-CM | POA: Diagnosis not present

## 2021-12-11 DIAGNOSIS — S0003XA Contusion of scalp, initial encounter: Secondary | ICD-10-CM | POA: Diagnosis not present

## 2021-12-17 DIAGNOSIS — Z96651 Presence of right artificial knee joint: Secondary | ICD-10-CM | POA: Diagnosis not present

## 2021-12-17 DIAGNOSIS — S0993XA Unspecified injury of face, initial encounter: Secondary | ICD-10-CM | POA: Diagnosis not present

## 2021-12-17 DIAGNOSIS — S0990XA Unspecified injury of head, initial encounter: Secondary | ICD-10-CM | POA: Diagnosis not present

## 2021-12-17 DIAGNOSIS — S0083XA Contusion of other part of head, initial encounter: Secondary | ICD-10-CM | POA: Diagnosis not present

## 2021-12-17 DIAGNOSIS — R519 Headache, unspecified: Secondary | ICD-10-CM | POA: Diagnosis not present

## 2021-12-17 DIAGNOSIS — S8991XA Unspecified injury of right lower leg, initial encounter: Secondary | ICD-10-CM | POA: Diagnosis not present

## 2021-12-17 DIAGNOSIS — S8001XA Contusion of right knee, initial encounter: Secondary | ICD-10-CM | POA: Diagnosis not present

## 2021-12-20 ENCOUNTER — Other Ambulatory Visit: Payer: Self-pay | Admitting: Internal Medicine

## 2021-12-24 ENCOUNTER — Telehealth: Payer: Self-pay

## 2021-12-24 ENCOUNTER — Encounter: Payer: Self-pay | Admitting: Internal Medicine

## 2021-12-27 NOTE — Telephone Encounter (Signed)
LM-12/27/21-Calling pt. To r/s CP visit from 7/25 to another date d/t CP unavailable.Spoke to pts. Husband Ed, and he informed me pt. Is currently out of town and won't be back until next Thursday. Informed pt. Husband, that I will call pt. When she returns.  Total time spent: 5 min.

## 2022-01-03 ENCOUNTER — Telehealth: Payer: Self-pay

## 2022-01-03 NOTE — Telephone Encounter (Signed)
LM-01/03/22-Called pt. To r/s CP visit from 7/25 d.t provider unavailable. Unable to reach pt. Will try calling at another time.  Total time spent: 2 min.

## 2022-01-06 NOTE — Telephone Encounter (Signed)
LM-01/06/22-Called pt. To r/s CP visit from 7/25 d.t provider unavailable. Unable to reach. [place].  Total time spent: 2 min.

## 2022-01-07 DIAGNOSIS — R260 Ataxic gait: Secondary | ICD-10-CM | POA: Diagnosis not present

## 2022-01-07 DIAGNOSIS — R531 Weakness: Secondary | ICD-10-CM | POA: Diagnosis not present

## 2022-01-07 DIAGNOSIS — M79604 Pain in right leg: Secondary | ICD-10-CM | POA: Diagnosis not present

## 2022-01-07 DIAGNOSIS — M48062 Spinal stenosis, lumbar region with neurogenic claudication: Secondary | ICD-10-CM | POA: Diagnosis not present

## 2022-01-08 ENCOUNTER — Other Ambulatory Visit: Payer: Self-pay | Admitting: Internal Medicine

## 2022-01-08 ENCOUNTER — Encounter: Payer: Self-pay | Admitting: Internal Medicine

## 2022-01-08 ENCOUNTER — Ambulatory Visit: Payer: Medicare PPO | Admitting: Internal Medicine

## 2022-01-08 VITALS — BP 134/74 | Temp 96.8°F | Resp 16 | Ht 63.0 in | Wt 133.6 lb

## 2022-01-08 DIAGNOSIS — I959 Hypotension, unspecified: Secondary | ICD-10-CM

## 2022-01-08 DIAGNOSIS — R5383 Other fatigue: Secondary | ICD-10-CM

## 2022-01-08 DIAGNOSIS — Z79899 Other long term (current) drug therapy: Secondary | ICD-10-CM | POA: Diagnosis not present

## 2022-01-08 DIAGNOSIS — J479 Bronchiectasis, uncomplicated: Secondary | ICD-10-CM

## 2022-01-08 DIAGNOSIS — R0600 Dyspnea, unspecified: Secondary | ICD-10-CM | POA: Diagnosis not present

## 2022-01-08 MED ORDER — BENZONATATE 200 MG PO CAPS: ORAL_CAPSULE | ORAL | 1 refills | Status: DC

## 2022-01-08 NOTE — Progress Notes (Signed)
Future Appointments  Date Time Provider Department  01/08/2022 10:30 AM Unk Pinto, MD [facility]  02/11/2022 11:30 AM Carleene Mains, RPH [facility]  04/08/2022 10:00 AM Unk Pinto, MD [facility]  07/16/2022 11:00 AM Alycia Rossetti, NP [facility]    History of Present Illness:      This [name]  with HTN, HLD, diet controlled T2_DM, Hypothyroidism, OSA/BiPAP (hx/o mask intolerance) , Crohn's Colitis  and Vitamin D Deficiency presents with concerns frequent falls & difficulty breathing .      Patient is using a rollator for balance & relates that she is already receiving Physical Therapy for Balance & Gait therapy ordered by her [place].  She relate that she has had falls w/o any severe injuries. She also is noted walking thru-out the office appearing perfectly balanced & stable gait.        In addition, patient relates episodes of dyspnea occurring at rest and denies any significant cough or congestion. She does report soreness ofher of hr lower anterior ribs after coughing.  Further she notes that she has been evaluated by "Pulmonary" - Dr Halford Chessman and has had an OSA eval recently.    Medications  Current Outpatient Medications (Endocrine & Metabolic):    dexamethasone 0.5 MG/5ML elixir, Take by mouth.   levothyroxine (SYNTHROID) 88 MCG tablet, Take  1 tablet  Daily  on an empty stomach with only water for 30 minutes & no Antacid meds, Calcium or Magnesium for 4 hours & avoid Biotin  Current Outpatient Medications (Cardiovascular):    bisoprolol (ZEBETA) 5 MG tablet, Take  1/2 to 1  tablet  every Morning  for BP (Patient taking differently: Take 2.5 mg by mouth daily.)   fenofibrate micronized (LOFIBRA) 134 MG capsule, Take 134 mg by mouth daily before breakfast.   olmesartan (BENICAR) 40 MG tablet, Take 20 mg by mouth daily.  Current Outpatient Medications (Respiratory):    benzonatate (TESSALON) 200 MG capsule, TAKE 1 CAP THREE TIMES DAILY AS  NEEDED FOR COUGH.   fluticasone (FLONASE) 50 MCG/ACT nasal spray, USE TWO SPRAYS IN EACH NOSTRIL DAILY AS NEEDED (Patient taking differently: 2 sprays at bedtime.)  Current Outpatient Medications (Analgesics):    traMADol (ULTRAM) 50 MG tablet, Take by mouth.  Current Outpatient Medications (Hematological):    Ferrous Sulfate (IRON PO), Take 1 tablet by mouth daily.   vitamin B-12 (CYANOCOBALAMIN) 500 MCG tablet, Take 500 mcg by mouth daily.  Current Outpatient Medications (Other):    buPROPion (WELLBUTRIN XL) 300 MG 24 hr tablet, Take  1 tablet  Daily  for Mood, Focus &  Concentration (Patient taking differently: Take 300 mg by mouth daily.)   carboxymethylcellulose (REFRESH PLUS) 0.5 % SOLN, Place 1 drop into both eyes 3 (three) times daily as needed (dry eyes).   Cholecalciferol 125 MCG (5000 UT) TABS, Take 5,000 Units by mouth daily.   DULoxetine (CYMBALTA) 60 MG capsule, TAKE 1 CAPSULE DAILY FOR CHRONIC PAIN   fluconazole (DIFLUCAN) 150 MG tablet, Take 150 mg by mouth daily.   gabapentin (NEURONTIN) 300 MG capsule, TAKE 1 CAPSULE BY MOUTH THREE TIMES A DAY   hydrocortisone 2.5 % ointment, Apply 1 application topically daily as needed (itching).   Lactobacillus (PROBIOTIC ACIDOPHILUS PO), Take 1 tablet by mouth daily.   meclizine (ANTIVERT) 25 MG tablet, TAKE 1 TABLET (25 MG TOTAL) BY MOUTH 3 (THREE) TIMES DAILY AS NEEDED FOR DIZZINESS.   metroNIDAZOLE (FLAGYL) 500 MG tablet, Take 500 mg by mouth  2 (two) times daily.   Multiple Vitamin (MULTIVITAMIN WITH MINERALS) TABS tablet, Take 1 tablet by mouth daily.   neomycin-polymyxin-hydrocortisone (CORTISPORIN) 3.5-10000-1 OTIC suspension, Instill  6 to 8 drops  to affected ear  3 to 4 x /day  and occlude with cotton (Patient taking differently: As needed for ear infections: Instill  6 to 8 drops  to affected ear  3 to 4 x /day  and occlude with cotton)   omeprazole (PRILOSEC) 40 MG capsule, Take 40 mg by mouth daily.   oxybutynin (DITROPAN) 5  MG tablet, TAKE 1 TABLET TWICE A DAY FOR BLADDER CONTROL   polyethylene glycol (MIRALAX / GLYCOLAX) 17 g packet, Take 17-34 g by mouth daily. Dose depends on degree of constipation   rOPINIRole (REQUIP) 2 MG tablet, TAKE 1 TABLET FOUR TIMES A DAY FOR RESTLESS LEGS (Patient taking differently: Take 1 mg by mouth See admin instructions. Take 1 tablet in AM, 1 tablet at dinner time, and 2 tablets at bedtime.)   senna-docusate (SENOKOT-S) 8.6-50 MG tablet, Take 2 tablets by mouth daily as needed for mild constipation.   Wheat Dextrin (BENEFIBER DRINK MIX PO), Take 1 Scoop by mouth daily.   calcium carbonate (OS-CAL) 1250 (500 Ca) MG chewable tablet, Chew 2 tablets by mouth daily. (Patient not taking: Reported on 01/08/2022)  Problem list She has Obstructive sleep apnea; RESTLESS LEG SYNDROME; Essential hypertension; Rhinitis; GERD; Crohn's Enterocolitis / Regional enteritis; Fibromyalgia; Gait disorder; Hyperlipidemia associated with type 2 diabetes mellitus ([place]); Hypothyroid; Depression, major, recurrent, in partial remission ([place]); Vitamin D deficiency; ASCVD; Medication management; Rotator cuff arthropathy; Obesity (BMI 30.0-34.9); Spinal stenosis in cervical region; IBS (irritable colon syndrome); Sleep talking; B12 deficiency anemia; Polypharmacy; Bronchiectasis without complication ([place]); Arthritis of carpometacarpal (CMC) joints of both thumbs; Bilateral bunions; Cervical disc disease; Crohn's ileitis ([place]); Rotator cuff tear; Diverticulosis; History of adenomatous polyp of colon; Internal hemorrhoids; Knee pain; Other specified arthropathy involving shoulder region; S/P revision of total knee, left; Diet-controlled type 2 diabetes mellitus ([place]); Rupture of popliteal cyst of right knee region (complex cyst); History of recurrent TIAs; COVID-19 virus infection; CKD stage 2 due to type 2 diabetes mellitus ([place]); History of total knee arthroplasty, right; Pain due to total right knee replacement ([place]);  Presence of artificial knee joint, right; Aortic atherosclerosis ([place]) by Chest CT scan on 09/2017; and Lumbar spinal stenosis on their problem list.   Observations/Objective:  BP  Sitting 134/74  & Standing  BP 104/68   //  T 96.8 F  //  R 16  //   Ht 5' 3"    //  Wt 133 lb 9.6 oz   //  SpO2 99%   //  BMI 23.67   HEENT - WNL. Neck - supple.  Chest - BS decreased , equal & clear w/o rales, rhonchi or wheezes.  Cor - Nl HS. RRR w/o sig MGR. PP 1(+). No edema. MS  -  FROM w/o deformities.   Mild spastic type posturing of extremities.         - Gait supported by a rollator. Neuro -  Nl w/o focal abnormalities,  except Mild Lt hemi-facial           paresis with widening of Lt interpalpebral fissure.   Assessment and Plan:   1. Hypotension, unspecified hypotension type  - CBC with Differential/Platelet - COMPLETE METABOLIC PANEL WITH GFR  2. Fatigue  - CBC with Differential/Platelet - COMPLETE METABOLIC PANEL WITH GFR - TSH  3. Dyspnea  -  CBC with Differential/Platelet - COMPLETE METABOLIC PANEL WITH GFR - TSH  - Ambulatory referral to Pulmonology   - Patient is advise to schedule to see her Pulmonary Doctor.   4. Medication management  - CBC with Differential/Platelet - COMPLETE METABOLIC PANEL WITH GFR - TSH   Follow Up Instructions:        I discussed the assessment and treatment plan with the patient. The patient was provided an opportunity to ask questions and all were answered. The patient agreed with the plan and demonstrated an understanding of the instructions. Over 35 minutes of counseling,  chart review and critical decision making was performed      Kirtland Bouchard, MD

## 2022-01-09 DIAGNOSIS — M79604 Pain in right leg: Secondary | ICD-10-CM | POA: Diagnosis not present

## 2022-01-09 DIAGNOSIS — R260 Ataxic gait: Secondary | ICD-10-CM | POA: Diagnosis not present

## 2022-01-09 DIAGNOSIS — M48062 Spinal stenosis, lumbar region with neurogenic claudication: Secondary | ICD-10-CM | POA: Diagnosis not present

## 2022-01-09 DIAGNOSIS — R531 Weakness: Secondary | ICD-10-CM | POA: Diagnosis not present

## 2022-01-09 LAB — CBC WITH DIFFERENTIAL/PLATELET
Absolute Monocytes: 638 cells/uL (ref 200–950)
Basophils Absolute: 67 cells/uL (ref 0–200)
Basophils Relative: 1.2 %
Eosinophils Absolute: 90 cells/uL (ref 15–500)
Eosinophils Relative: 1.6 %
HCT: 41.1 % (ref 35.0–45.0)
Hemoglobin: 13.4 g/dL (ref 11.7–15.5)
Lymphs Abs: 1439 cells/uL (ref 850–3900)
MCH: 29.1 pg (ref 27.0–33.0)
MCHC: 32.6 g/dL (ref 32.0–36.0)
MCV: 89.2 fL (ref 80.0–100.0)
MPV: 9.7 fL (ref 7.5–12.5)
Monocytes Relative: 11.4 %
Neutro Abs: 3366 cells/uL (ref 1500–7800)
Neutrophils Relative %: 60.1 %
Platelets: 306 10*3/uL (ref 140–400)
RBC: 4.61 10*6/uL (ref 3.80–5.10)
RDW: 14.4 % (ref 11.0–15.0)
Total Lymphocyte: 25.7 %
WBC: 5.6 10*3/uL (ref 3.8–10.8)

## 2022-01-09 LAB — COMPLETE METABOLIC PANEL WITH GFR
AG Ratio: 1.6 (calc) (ref 1.0–2.5)
ALT: 9 U/L (ref 6–29)
AST: 20 U/L (ref 10–35)
Albumin: 4.1 g/dL (ref 3.6–5.1)
Alkaline phosphatase (APISO): 63 U/L (ref 37–153)
BUN: 18 mg/dL (ref 7–25)
CO2: 27 mmol/L (ref 20–32)
Calcium: 10 mg/dL (ref 8.6–10.4)
Chloride: 106 mmol/L (ref 98–110)
Creat: 0.8 mg/dL (ref 0.60–1.00)
Globulin: 2.5 g/dL (calc) (ref 1.9–3.7)
Glucose, Bld: 91 mg/dL (ref 65–99)
Potassium: 5.5 mmol/L — ABNORMAL HIGH (ref 3.5–5.3)
Sodium: 143 mmol/L (ref 135–146)
Total Bilirubin: 0.4 mg/dL (ref 0.2–1.2)
Total Protein: 6.6 g/dL (ref 6.1–8.1)
eGFR: 78 mL/min/{1.73_m2} (ref >=60)

## 2022-01-09 LAB — TSH: TSH: 2.45 mIU/L (ref 0.40–4.50)

## 2022-01-09 NOTE — Telephone Encounter (Signed)
LM-01/21/22-Called pt. To inform her currently there are no openings for sooner appointment per pt. Request. Unable to reach pt. LVM. Pt. Initial visit is scheduled for 8/15.  Total time spent: 2 min.     LM-01/09/22-Calling pt. To see if she'd like to be seen on 7/18. Pt. Requested to be seen sooner if possible. Pt. Is unavailable for 7/18. Pt. Has an upcoming surgery and would like me to call her in 1-2 weeks to let her know if there are any new openings. Pt. Asked about a referral to pulmonology, I assisted pt. By transferring her directly to [place] to discuss.  Total time spent: 8 min.

## 2022-01-10 ENCOUNTER — Encounter: Payer: Self-pay | Admitting: Internal Medicine

## 2022-01-10 ENCOUNTER — Encounter (HOSPITAL_BASED_OUTPATIENT_CLINIC_OR_DEPARTMENT_OTHER): Payer: Self-pay | Admitting: Obstetrics and Gynecology

## 2022-01-10 ENCOUNTER — Emergency Department (HOSPITAL_BASED_OUTPATIENT_CLINIC_OR_DEPARTMENT_OTHER)
Admission: EM | Admit: 2022-01-10 | Discharge: 2022-01-10 | Disposition: A | Discharge status: Home / Self Care | Payer: Medicare PPO | Attending: Emergency Medicine | Admitting: Emergency Medicine

## 2022-01-10 ENCOUNTER — Other Ambulatory Visit: Payer: Self-pay

## 2022-01-10 ENCOUNTER — Emergency Department (HOSPITAL_BASED_OUTPATIENT_CLINIC_OR_DEPARTMENT_OTHER): Payer: Medicare PPO

## 2022-01-10 ENCOUNTER — Emergency Department (HOSPITAL_BASED_OUTPATIENT_CLINIC_OR_DEPARTMENT_OTHER): Payer: Medicare PPO | Admitting: Radiology

## 2022-01-10 DIAGNOSIS — E039 Hypothyroidism, unspecified: Secondary | ICD-10-CM | POA: Insufficient documentation

## 2022-01-10 DIAGNOSIS — Z79899 Other long term (current) drug therapy: Secondary | ICD-10-CM | POA: Insufficient documentation

## 2022-01-10 DIAGNOSIS — R0602 Shortness of breath: Secondary | ICD-10-CM

## 2022-01-10 DIAGNOSIS — R079 Chest pain, unspecified: Secondary | ICD-10-CM | POA: Diagnosis not present

## 2022-01-10 DIAGNOSIS — K760 Fatty (change of) liver, not elsewhere classified: Secondary | ICD-10-CM | POA: Diagnosis not present

## 2022-01-10 DIAGNOSIS — R000 Tachycardia, unspecified: Secondary | ICD-10-CM | POA: Diagnosis not present

## 2022-01-10 DIAGNOSIS — I10 Essential (primary) hypertension: Secondary | ICD-10-CM | POA: Insufficient documentation

## 2022-01-10 DIAGNOSIS — R0789 Other chest pain: Secondary | ICD-10-CM | POA: Diagnosis not present

## 2022-01-10 LAB — CBC
HCT: 44.3 % (ref 36.0–46.0)
Hemoglobin: 14.4 g/dL (ref 12.0–15.0)
MCH: 29.2 pg (ref 26.0–34.0)
MCHC: 32.5 g/dL (ref 30.0–36.0)
MCV: 89.9 fL (ref 80.0–100.0)
Platelets: 349 10*3/uL (ref 150–400)
RBC: 4.93 MIL/uL (ref 3.87–5.11)
RDW: 14.8 % (ref 11.5–15.5)
WBC: 6.7 10*3/uL (ref 4.0–10.5)
nRBC: 0 % (ref 0.0–0.2)

## 2022-01-10 LAB — BASIC METABOLIC PANEL
Anion gap: 12 (ref 5–15)
BUN: 20 mg/dL (ref 8–23)
CO2: 27 mmol/L (ref 22–32)
Calcium: 10.3 mg/dL (ref 8.9–10.3)
Chloride: 101 mmol/L (ref 98–111)
Creatinine, Ser: 0.85 mg/dL (ref 0.44–1.00)
GFR, Estimated: >60 mL/min (ref >60)
Glucose, Bld: 133 mg/dL — ABNORMAL HIGH (ref 70–99)
Potassium: 3.8 mmol/L (ref 3.5–5.1)
Sodium: 140 mmol/L (ref 135–145)

## 2022-01-10 LAB — TROPONIN I (HIGH SENSITIVITY)
Troponin I (High Sensitivity): <2 ng/L (ref <18)
Troponin I (High Sensitivity): 2 ng/L (ref <18)

## 2022-01-10 MED ORDER — IOHEXOL 350 MG/ML SOLN
100.0000 mL | Freq: Once | INTRAVENOUS | Status: AC | PRN
Start: 2022-01-10 — End: 2022-01-10
  Administered 2022-01-10: 100 mL via INTRAVENOUS

## 2022-01-10 NOTE — ED Notes (Signed)
Discharge paperwork given and understood. 

## 2022-01-10 NOTE — ED Triage Notes (Signed)
Patient reports to the ER for chest pain, tachycardia, and shortness of breath. Patient reports she has been having this since her since surgery on March 9th but it has gotten worse recently.

## 2022-01-10 NOTE — Progress Notes (Signed)
<><><><><><><><><><><><><><><><><><><><><><><><><><><><><><><><><> <><><><><><><><><><><><><><><><><><><><><><><><><><><><><><><><><> -   Test results slightly outside the reference range are not unusual. If there is anything important, I will review this with you,  otherwise it is considered normal test values.  If you have further questions,  please do not hesitate to contact me at the office or via My Chart.  <><><><><><><><><><><><><><><><><><><><><><><><><><><><><><><><><> <><><><><><><><><><><><><><><><><><><><><><><><><><><><><><><><><>  -  All labs OK except potassium slightly elevated,                                                     ( which is most likely a lab error)   but suggest avoid any supplemental Potassium  as                                                                       "Lite-Salt", "Salt Substitute" or  "No - Salt".  <><><><><><><><><><><><><><><><><><><><><><><><><><><><><><><><><>  -  Reminder to call your Lung Doctor's office to                                                   schedule follow-up for your chronic shortness of breath  <><><><><><><><><><><><><><><><><><><><><><><><><><><><><><><><><>  -

## 2022-01-10 NOTE — Discharge Instructions (Signed)
Follow-up with your regular doctor as well as cardiology.  May be reasonable to get a nonexercise stress test and echocardiogram.  Today's work-up to include troponins x2 which both were very normal and a CT angio of the chest no acute findings.  We also made the recommend home monitoring for the heart rhythm.  Also follow-up with pulmonary medicine.  Return for any new or worse symptoms.

## 2022-01-10 NOTE — ED Provider Notes (Addendum)
[facility] EMERGENCY DEPT Provider Note   CSN: [id number] Arrival date & time: 01/10/22  1535     History  Chief Complaint  Patient presents with   Chest Pain    Crystal [name] is a 71 y.o. female.  Patient presenting with the complaint of chest pain and shortness of breath since March 9.  And also some of the chest pain is more chest wall pain bilateral lower part of the anterior ribs.  Patient has been seen by pulmonary medicine recently and also seen by primary just on July 12.  Patient has felt that her heart is been going fast today worsening shortness of breath.  Patient had back surgery in March still has some numbness going down her right leg that is changed.  Follow-up with them soon.  Patient supposed to be on CPAP but has not been tolerating that according to pulmonary notes.  They are trying to work through that.  Past medical history significant for sleep syndrome fibromyalgia hyperlipidemia hypothyroid hypertension gastroesophageal reflux disease prediabetic.  Past surgical history sniffing for an appendectomy tonsillectomy abdominal hysterectomy.  Patient never smoked.       Home Medications Prior to Admission medications   Medication Sig Start Date End Date Taking? Authorizing Provider  benzonatate (TESSALON) 200 MG capsule TAKE 1 CAP THREE TIMES DAILY AS NEEDED FOR COUGH. 01/08/22   Unk Pinto, MD  bisoprolol (ZEBETA) 5 MG tablet Take  1/2 to 1  tablet  every Morning  for BP Patient taking differently: Take 2.5 mg by mouth daily. 08/22/21   [name], NP  carboxymethylcellulose (REFRESH PLUS) 0.5 % SOLN Place 1 drop into both eyes 3 (three) times daily as needed (dry eyes).    [provider]  Cholecalciferol 125 MCG (5000 UT) TABS Take 5,000 Units by mouth daily.    [provider]  dexamethasone 0.5 MG/5ML elixir Take by mouth.    [provider]  DULoxetine (CYMBALTA) 60 MG capsule TAKE 1 CAPSULE  DAILY FOR CHRONIC PAIN 11/04/21   Alycia Rossetti, NP  fenofibrate micronized (LOFIBRA) 134 MG capsule Take 134 mg by mouth daily before breakfast.    [provider]  Ferrous Sulfate (IRON PO) Take 1 tablet by mouth daily.    [provider]  fluconazole (DIFLUCAN) 150 MG tablet Take 150 mg by mouth daily.    [provider]  fluticasone (FLONASE) 50 MCG/ACT nasal spray USE TWO SPRAYS IN EACH NOSTRIL DAILY AS NEEDED Patient taking differently: 2 sprays at bedtime. 07/18/21   Alycia Rossetti, NP  gabapentin (NEURONTIN) 300 MG capsule TAKE 1 CAPSULE BY MOUTH THREE TIMES A DAY 02/21/21   Unk Pinto, MD  hydrocortisone 2.5 % ointment Apply 1 application topically daily as needed (itching).    [provider]  Lactobacillus (PROBIOTIC ACIDOPHILUS PO) Take 1 tablet by mouth daily.    [provider]  levothyroxine (SYNTHROID) 88 MCG tablet Take  1 tablet  Daily  on an empty stomach with only water for 30 minutes & no Antacid meds, Calcium or Magnesium for 4 hours & avoid Biotin 07/20/21   Unk Pinto, MD  meclizine (ANTIVERT) 25 MG tablet TAKE 1 TABLET (25 MG TOTAL) BY MOUTH 3 (THREE) TIMES DAILY AS NEEDED FOR DIZZINESS. 06/13/18   Unk Pinto, MD  metroNIDAZOLE (FLAGYL) 500 MG tablet Take 500 mg by mouth 2 (two) times daily. 08/16/21   [provider]  Multiple Vitamin (MULTIVITAMIN WITH MINERALS) TABS tablet Take 1 tablet by  mouth daily.    [provider]  neomycin-polymyxin-hydrocortisone (CORTISPORIN) 3.5-10000-1 OTIC suspension Instill  6 to 8 drops  to affected ear  3 to 4 x /day  and occlude with cotton Patient taking differently: As needed for ear infections: Instill  6 to 8 drops  to affected ear  3 to 4 x /day  and occlude with cotton 12/14/20   Unk Pinto, MD  olmesartan (BENICAR) 40 MG tablet Take 20 mg by mouth daily.    [provider]  omeprazole (PRILOSEC) 40 MG capsule Take 40 mg by mouth daily.     [provider]  oxybutynin (DITROPAN) 5 MG tablet TAKE 1 TABLET TWICE A DAY FOR BLADDER CONTROL 01/06/21   Unk Pinto, MD  polyethylene glycol (MIRALAX / GLYCOLAX) 17 g packet Take 17-34 g by mouth daily. Dose depends on degree of constipation    [provider]  rOPINIRole (REQUIP) 2 MG tablet TAKE 1 TABLET FOUR TIMES A DAY FOR RESTLESS LEGS Patient taking differently: Take 1 mg by mouth See admin instructions. Take 1 tablet in AM, 1 tablet at dinner time, and 2 tablets at bedtime. 04/08/21   Unk Pinto, MD  senna-docusate (SENOKOT-S) 8.6-50 MG tablet Take 2 tablets by mouth daily as needed for mild constipation.    [provider]  traMADol (ULTRAM) 50 MG tablet Take by mouth. 11/08/21   [provider]  vitamin B-12 (CYANOCOBALAMIN) 500 MCG tablet Take 500 mcg by mouth daily.    [provider]  Wheat Dextrin (BENEFIBER DRINK MIX PO) Take 1 Scoop by mouth daily.    [provider]      Allergies    Codeine, Statins, Amitriptyline, Erythromycin base, Fish oil, Flax seed [flax seed oil], Flaxseed (linseed), Hydrocodone, Morphine, Nsaids, Nuvigil [armodafinil], Sertraline, Sinemet [carbidopa w-levodopa], Sulfa antibiotics, and Tolmetin    Review of Systems   Review of Systems  Constitutional:  Negative for chills and fever.  HENT:  Negative for ear pain and sore throat.   Eyes:  Negative for pain and visual disturbance.  Respiratory:  Positive for shortness of breath. Negative for cough.   Cardiovascular:  Positive for chest pain. Negative for palpitations.  Gastrointestinal:  Negative for abdominal pain and vomiting.  Genitourinary:  Negative for dysuria and hematuria.  Musculoskeletal:  Negative for arthralgias and back pain.  Skin:  Negative for color change and rash.  Neurological:  Positive for numbness. Negative for seizures and syncope.  All other systems reviewed and are negative.   Physical Exam Updated Vital  Signs BP 122/74 (BP Location: Right Arm)   Pulse (!) 113   Temp 98.1 F (36.7 C) (Oral)   Resp 16   SpO2 100%  Physical Exam Vitals and nursing note reviewed.  Constitutional:      General: She is not in acute distress.    Appearance: Normal appearance. She is well-developed.  HENT:     Head: Normocephalic and atraumatic.  Eyes:     Extraocular Movements: Extraocular movements intact.     Conjunctiva/sclera: Conjunctivae normal.     Pupils: Pupils are equal, round, and reactive to light.  Cardiovascular:     Rate and Rhythm: Regular rhythm. Tachycardia present.     Heart sounds: No murmur heard. Pulmonary:     Effort: Pulmonary effort is normal. No respiratory distress.     Breath sounds: Normal breath sounds.  Chest:     Chest wall: No tenderness.  Abdominal:     Palpations: Abdomen is  soft.     Tenderness: There is no abdominal tenderness.  Musculoskeletal:        General: No swelling.     Cervical back: Neck supple.     Right lower leg: No edema.     Left lower leg: No edema.  Skin:    General: Skin is warm and dry.     Capillary Refill: Capillary refill takes less than 2 seconds.  Neurological:     General: No focal deficit present.     Mental Status: She is alert and oriented to person, place, and time.     Cranial Nerves: No cranial nerve deficit.     Sensory: Sensory deficit present.     Motor: No weakness.  Psychiatric:        Mood and Affect: Mood normal.     ED Results / Procedures / Treatments   Labs (all labs ordered are listed, but only abnormal results are displayed) Labs Reviewed  BASIC METABOLIC PANEL - Abnormal; Notable for the following components:      Result Value   Glucose, Bld 133 (*)    All other components within normal limits  CBC  TROPONIN I (HIGH SENSITIVITY)  TROPONIN I (HIGH SENSITIVITY)    EKG EKG Interpretation  Date/Time:  Friday January 10 2022 15:46:56 EDT Ventricular Rate:  112 PR Interval:  178 QRS Duration: 77 QT  Interval:  330 QTC Calculation: 451 R Axis:   17 Text Interpretation: No significant change since last tracing Sinus tachycardia Low voltage, precordial leads Confirmed by Fredia Sorrow [phone]) on 01/10/2022 3:49:42 PM  Radiology CT Angio Chest PE W/Cm &/Or Wo Cm  Result Date: 01/10/2022 CLINICAL DATA:  Chest pain, tachycardia, shortness of breath EXAM: CT ANGIOGRAPHY CHEST WITH CONTRAST TECHNIQUE: Multidetector CT imaging of the chest was performed using the standard protocol during bolus administration of intravenous contrast. Multiplanar CT image reconstructions and MIPs were obtained to evaluate the vascular anatomy. RADIATION DOSE REDUCTION: This exam was performed according to the departmental dose-optimization program which includes automated exposure control, adjustment of the mA and/or kV according to patient size and/or use of iterative reconstruction technique. CONTRAST:  133m OMNIPAQUE IOHEXOL 350 MG/ML SOLN COMPARISON:  01/20/2021 FINDINGS: Cardiovascular: There is homogeneous enhancement in thoracic aorta. There are no intraluminal filling defects in pulmonary artery branches. Mediastinum/Nodes: No significant lymphadenopathy is seen. Thyroid appears smaller than usual incised. Lungs/Pleura: There is no focal pulmonary consolidation. There is no pleural effusion or pneumothorax. Upper Abdomen: There is fatty infiltration of the liver. Spleen measures 14.7 cm in AP diameter. There is contrast in the lumen of pelvocaliceal systems in the visualized upper portions of both kidneys limiting evaluation for small nonobstructing renal stone. Musculoskeletal: Degenerative changes are noted in thoracic spine with bony spurs. There is previous anterior surgical fusion in cervical spine. Review of the MIP images confirms the above findings. IMPRESSION: There is no evidence of thoracic aortic dissection. There is no pulmonary artery embolism. There is no focal pulmonary consolidation. Fatty liver.   Enlarged spleen. Electronically Signed   By: PElmer PickerM.D.   On: 01/10/2022 17:45   DG Chest 2 View  Result Date: 01/10/2022 CLINICAL DATA:  Chest pain EXAM: CHEST - 2 VIEW COMPARISON:  Chest x-ray dated December 20, 2020 FINDINGS: Cardiac and mediastinal contours are within normal limits. Lungs are clear. No pleural effusion or pneumothorax. Total right shoulder arthroplasty. IMPRESSION: No active cardiopulmonary disease. Electronically Signed   By: LYetta GlassmanM.D.   On: 01/10/2022  16:27    Procedures Procedures    Medications Ordered in ED Medications  iohexol (OMNIPAQUE) 350 MG/ML injection 100 mL (100 mLs Intravenous Contrast Given 01/10/22 1717)    ED Course/ Medical Decision Making/ A&P                           Medical Decision Making Amount and/or Complexity of Data Reviewed Labs: ordered. Radiology: ordered.  Risk Prescription drug management.   Patient's labs 2 days ago without any significant abnormalities but will repeat here today.  Basic metabolic panel glucose 945 otherwise normal CBC normal hemoglobin normal at 14 check initial troponin less than 2.  CT angio chest ordered chest x-ray 2 view no active cardiopulmonary disease.  CT angios no evidence of thoracic aortic dissection there is no pulmonary embolism there is no focal pulmonary consolidation all very reassuring.  See what we get on the delta troponin.  Delta troponins without any abnormalities at all.  CT angio of the chest without any acute findings.  Heart rate here is been anywhere in the 90s to low 100s.  Always been sinus rhythm.  Would recommend follow-up with cardiology may be consideration for home heart monitoring.  Also would keep follow-up with primary care doctor as well as pulmonary medicine.  Would stay off your blood pressure medicines as your doctor has ordered from the other day for now and then they can follow blood pressure up in the office.   Final Clinical Impression(s) / ED  Diagnoses Final diagnoses:  SOB (shortness of breath)  Chest wall pain    Rx / DC Orders ED Discharge Orders     None         Fredia Sorrow, MD 01/10/22 Artist Pais    Fredia Sorrow, MD 01/10/22 [id number]

## 2022-01-13 ENCOUNTER — Encounter: Payer: Self-pay | Admitting: Internal Medicine

## 2022-01-15 ENCOUNTER — Ambulatory Visit: Payer: Medicare PPO | Admitting: Nurse Practitioner

## 2022-01-15 VITALS — BP 108/66 | HR 86 | Temp 97.7°F | Ht 63.0 in | Wt 135.0 lb

## 2022-01-15 DIAGNOSIS — R519 Headache, unspecified: Secondary | ICD-10-CM

## 2022-01-15 DIAGNOSIS — R079 Chest pain, unspecified: Secondary | ICD-10-CM | POA: Diagnosis not present

## 2022-01-15 DIAGNOSIS — W19XXXA Unspecified fall, initial encounter: Secondary | ICD-10-CM

## 2022-01-15 DIAGNOSIS — R0602 Shortness of breath: Secondary | ICD-10-CM

## 2022-01-15 NOTE — Progress Notes (Signed)
ER Follow Up  Assessment and Plan: Hospital visit follow up for:   1. Shortness of breath Continue to follow with outpatient Pulmonology Discuss changes to CPAP machine Monitor O2  2. Chest pain, unspecified type Continue to monitor for any increase in CP or elevated BP Discussed placement of cardiac holter monitor via Cardio/Pulm. Report to ER for any increase in stroke like symptoms, including HA, N/V, paralysis, difficulty speaking, trouble walking, confusion, vision changes, CP, heart palpitations, SOB, diaphoresis.  3. Right sided facial pain Follow up with Dentist/Oral surgery for further review and evaluation.  4. Fall, initial encounter Move slowly. Monitor BP.  If <60/90 stay off BP medications. Stay well hydrated. Continue to monitor  All medications were reviewed with patient and family and fully reconciled. All questions answered fully, and patient and family members were encouraged to call the office with any further questions or concerns. Discussed goal to avoid readmission related to this diagnosis.    Over 30 minutes of exam, counseling, chart review, and complex, high/moderate level critical decision making was performed this visit.   Future Appointments  Date Time Provider [place]  02/11/2022 11:30 AM Carleene Mains, [facility] None  04/08/2022 10:00 AM Unk Pinto, MD [facility] None  07/16/2022 11:00 AM Alycia Rossetti, NP [facility] None     HPI 71 y.o.female presents for follow up for transition from recent hospitalization or SNIF stay. Admit date to the hospital was 01/10/22, patient was discharged from the hospital on 01/10/22 and our clinical staff contacted the office the day after discharge to set up a follow up appointment. The discharge summary, medications, and diagnostic test results were reviewed before meeting with the patient. The patient was admitted for:   Patient presented to ER with the complaint of chest pain and  shortness of breath since March 9.  And also some of the chest pain is more chest wall pain bilateral lower part of the anterior ribs.  Patient has been seen by pulmonary medicine recently and also seen by primary just on July 12.  Patient has felt that her heart is been going fast today worsening shortness of breath.  Patient had back surgery in March still has some numbness going down her right leg that is changed.  Follow-up with them soon.  Patient supposed to be on CPAP but has not been tolerating that according to pulmonary notes.  They are trying to work through that.  Past medical history significant for sleep syndrome fibromyalgia hyperlipidemia hypothyroid hypertension gastroesophageal reflux disease prediabetic.  Past surgical history sniffing for an appendectomy tonsillectomy abdominal hysterectomy.  Patient never smoked.  Patient's labs 2 days ago without any significant abnormalities but will repeated in ER.  Basic metabolic panel glucose 086 otherwise normal CBC normal hemoglobin normal at 14 check initial troponin less than 2.  CT angio chest ordered chest x-ray 2 view no active cardiopulmonary disease.  CT angios no evidence of thoracic aortic dissection there is no pulmonary embolism there was no focal pulmonary consolidation.  Delta troponins without any abnormalities at all.  CT angio of the chest without any acute findings.  Heart rate here is been anywhere in the 90s to low 100s.  Always been sinus rhythm.  Would recommend follow-up with cardiology may be consideration for home heart monitoring.  She follows with Cardio/Pulm. She has continued to stay off your blood pressure medicines.  She also reports hitting the left side of her face during a fall.  There were no ED  discharger orders for review.     Home health is not involved.   Images while in the hospital: CT Angio Chest PE W/Cm &/Or Wo Cm  Result Date: 01/10/2022 CLINICAL DATA:  Chest pain, tachycardia, shortness of breath  EXAM: CT ANGIOGRAPHY CHEST WITH CONTRAST TECHNIQUE: Multidetector CT imaging of the chest was performed using the standard protocol during bolus administration of intravenous contrast. Multiplanar CT image reconstructions and MIPs were obtained to evaluate the vascular anatomy. RADIATION DOSE REDUCTION: This exam was performed according to the departmental dose-optimization program which includes automated exposure control, adjustment of the mA and/or kV according to patient size and/or use of iterative reconstruction technique. CONTRAST:  153m OMNIPAQUE IOHEXOL 350 MG/ML SOLN COMPARISON:  01/20/2021 FINDINGS: Cardiovascular: There is homogeneous enhancement in thoracic aorta. There are no intraluminal filling defects in pulmonary artery branches. Mediastinum/Nodes: No significant lymphadenopathy is seen. Thyroid appears smaller than usual incised. Lungs/Pleura: There is no focal pulmonary consolidation. There is no pleural effusion or pneumothorax. Upper Abdomen: There is fatty infiltration of the liver. Spleen measures 14.7 cm in AP diameter. There is contrast in the lumen of pelvocaliceal systems in the visualized upper portions of both kidneys limiting evaluation for small nonobstructing renal stone. Musculoskeletal: Degenerative changes are noted in thoracic spine with bony spurs. There is previous anterior surgical fusion in cervical spine. Review of the MIP images confirms the above findings. IMPRESSION: There is no evidence of thoracic aortic dissection. There is no pulmonary artery embolism. There is no focal pulmonary consolidation. Fatty liver.  Enlarged spleen. Electronically Signed   By: PElmer PickerM.D.   On: 01/10/2022 17:45   DG Chest 2 View  Result Date: 01/10/2022 CLINICAL DATA:  Chest pain EXAM: CHEST - 2 VIEW COMPARISON:  Chest x-ray dated December 20, 2020 FINDINGS: Cardiac and mediastinal contours are within normal limits. Lungs are clear. No pleural effusion or pneumothorax. Total  right shoulder arthroplasty. IMPRESSION: No active cardiopulmonary disease. Electronically Signed   By: LYetta GlassmanM.D.   On: 01/10/2022 16:27     Current Outpatient Medications (Endocrine & Metabolic):    dexamethasone 0.5 MG/5ML elixir, Take by mouth.   levothyroxine (SYNTHROID) 88 MCG tablet, Take  1 tablet  Daily  on an empty stomach with only water for 30 minutes & no Antacid meds, Calcium or Magnesium for 4 hours & avoid Biotin  Current Outpatient Medications (Cardiovascular):    fenofibrate micronized (LOFIBRA) 134 MG capsule, Take 134 mg by mouth daily before breakfast.   bisoprolol (ZEBETA) 5 MG tablet, Take  1/2 to 1  tablet  every Morning  for BP (Patient not taking: Reported on 01/15/2022)   olmesartan (BENICAR) 40 MG tablet, Take 20 mg by mouth daily. (Patient not taking: Reported on 01/15/2022)  Current Outpatient Medications (Respiratory):    benzonatate (TESSALON) 200 MG capsule, TAKE 1 CAP THREE TIMES DAILY AS NEEDED FOR COUGH.   fluticasone (FLONASE) 50 MCG/ACT nasal spray, USE TWO SPRAYS IN EACH NOSTRIL DAILY AS NEEDED (Patient taking differently: 2 sprays at bedtime.)  Current Outpatient Medications (Analgesics):    traMADol (ULTRAM) 50 MG tablet, Take by mouth.  Current Outpatient Medications (Hematological):    Ferrous Sulfate (IRON PO), Take 1 tablet by mouth daily.   vitamin B-12 (CYANOCOBALAMIN) 500 MCG tablet, Take 500 mcg by mouth daily.  Current Outpatient Medications (Other):    carboxymethylcellulose (REFRESH PLUS) 0.5 % SOLN, Place 1 drop into both eyes 3 (three) times daily as needed (dry eyes).   Cholecalciferol 125 MCG (  5000 UT) TABS, Take 5,000 Units by mouth daily.   DULoxetine (CYMBALTA) 60 MG capsule, TAKE 1 CAPSULE DAILY FOR CHRONIC PAIN   fluconazole (DIFLUCAN) 150 MG tablet, Take 150 mg by mouth daily.   gabapentin (NEURONTIN) 300 MG capsule, TAKE 1 CAPSULE BY MOUTH THREE TIMES A DAY   hydrocortisone 2.5 % ointment, Apply 1 application  topically daily as needed (itching).   Lactobacillus (PROBIOTIC ACIDOPHILUS PO), Take 1 tablet by mouth daily.   meclizine (ANTIVERT) 25 MG tablet, TAKE 1 TABLET (25 MG TOTAL) BY MOUTH 3 (THREE) TIMES DAILY AS NEEDED FOR DIZZINESS.   metroNIDAZOLE (FLAGYL) 500 MG tablet, Take 500 mg by mouth 2 (two) times daily.   Multiple Vitamin (MULTIVITAMIN WITH MINERALS) TABS tablet, Take 1 tablet by mouth daily.   neomycin-polymyxin-hydrocortisone (CORTISPORIN) 3.5-10000-1 OTIC suspension, Instill  6 to 8 drops  to affected ear  3 to 4 x /day  and occlude with cotton (Patient taking differently: As needed for ear infections: Instill  6 to 8 drops  to affected ear  3 to 4 x /day  and occlude with cotton)   omeprazole (PRILOSEC) 40 MG capsule, Take 40 mg by mouth daily.   oxybutynin (DITROPAN) 5 MG tablet, TAKE 1 TABLET TWICE A DAY FOR BLADDER CONTROL   polyethylene glycol (MIRALAX / GLYCOLAX) 17 g packet, Take 17-34 g by mouth daily. Dose depends on degree of constipation   rOPINIRole (REQUIP) 2 MG tablet, TAKE 1 TABLET FOUR TIMES A DAY FOR RESTLESS LEGS (Patient taking differently: Take 1 mg by mouth See admin instructions. Take 1 tablet in AM, 1 tablet at dinner time, and 2 tablets at bedtime.)   senna-docusate (SENOKOT-S) 8.6-50 MG tablet, Take 2 tablets by mouth daily as needed for mild constipation.   Wheat Dextrin (BENEFIBER DRINK MIX PO), Take 1 Scoop by mouth daily.  Past Medical History:  Diagnosis Date   Arthritis    ASCVD (arteriosclerotic cardiovascular disease)    Closed nondisplaced subtrochanteric fracture of left femur ([place]) 09/03/2017   September 2018.  Status post surgery by Dr. Marcelino Scot   Depression    Dyspnea    Fibromyalgia    Gait disorder 10/26/2012   GERD (gastroesophageal reflux disease)    GI bleed    Hyperlipidemia    Hypertension    Hypothyroid    Periprosthetic supracondylar fracture of femur, left  03/23/2017   Pneumonia    Pre-diabetes    Restless leg syndrome     Rhinitis 06/25/2010   Sleep apnea    Spinal headache    with the C section   TIA (transient ischemic attack)    3         Vitamin D deficiency      Allergies  Allergen Reactions   Codeine Other (See Comments)    Headache   Statins Other (See Comments)    Extreme pain   Amitriptyline Other (See Comments)    Confusion and hallucinations   Erythromycin Base Rash    Flushing/red face.   Fish Oil Nausea Only   Flax Seed [Flax Seed Oil] Rash   Flaxseed (Linseed) Rash   Hydrocodone Itching and Rash        Morphine Nausea And Vomiting and Rash   Nsaids Nausea Only   Nuvigil [Armodafinil] Other (See Comments)    Unspecified   Sertraline Rash   Sinemet [Carbidopa W-Levodopa] Rash   Sulfa Antibiotics Rash   Tolmetin Nausea Only    ROS: all negative except above.   Physical  Exam: Filed Weights   01/15/22 1429  Weight: 135 lb (61.2 kg)   BP 108/66   Pulse 86   Temp 97.7 F (36.5 C)   Ht 5' 3"  (1.6 m)   Wt 135 lb (61.2 kg)   SpO2 90%   BMI 23.91 kg/m  General Appearance: Well nourished, in no apparent distress. Eyes: PERRLA, EOMs, conjunctiva no swelling or erythema Sinuses: No Frontal/maxillary tenderness ENT/Mouth: Ext aud canals clear, TMs without erythema, bulging. No erythema, swelling, or exudate on post pharynx.  Tonsils not swollen or erythematous. Hearing normal.  Neck: Supple, thyroid normal.  Respiratory: Respiratory effort normal, BS equal bilaterally without rales, rhonchi, wheezing or stridor.  Cardio: RRR with no MRGs. Brisk peripheral pulses without edema.  Abdomen: Soft, + BS.  Non tender, no guarding, rebound, hernias, masses. Lymphatics: Non tender without lymphadenopathy.  Musculoskeletal: Full ROM, 5/5 strength, normal gait.  Skin: Warm, dry without rashes, lesions, ecchymosis.  Neuro: Cranial nerves intact. Normal muscle tone, no cerebellar symptoms. Sensation intact.  Psych: Awake and oriented X 3, normal affect, Insight and Judgment  appropriate.     Darrol Jump, NP 3:02 PM [facility]

## 2022-01-17 ENCOUNTER — Other Ambulatory Visit: Payer: Self-pay | Admitting: Internal Medicine

## 2022-01-20 DIAGNOSIS — M48062 Spinal stenosis, lumbar region with neurogenic claudication: Secondary | ICD-10-CM | POA: Diagnosis not present

## 2022-01-20 DIAGNOSIS — R260 Ataxic gait: Secondary | ICD-10-CM | POA: Diagnosis not present

## 2022-01-20 DIAGNOSIS — M79604 Pain in right leg: Secondary | ICD-10-CM | POA: Diagnosis not present

## 2022-01-20 DIAGNOSIS — R531 Weakness: Secondary | ICD-10-CM | POA: Diagnosis not present

## 2022-01-21 ENCOUNTER — Ambulatory Visit: Payer: Medicare PPO | Admitting: Pharmacy Technician

## 2022-01-21 DIAGNOSIS — G4733 Obstructive sleep apnea (adult) (pediatric): Secondary | ICD-10-CM | POA: Diagnosis not present

## 2022-01-21 DIAGNOSIS — Z789 Other specified health status: Secondary | ICD-10-CM | POA: Diagnosis not present

## 2022-01-21 DIAGNOSIS — J3489 Other specified disorders of nose and nasal sinuses: Secondary | ICD-10-CM | POA: Diagnosis not present

## 2022-01-21 DIAGNOSIS — J34829 Nasal valve collapse, unspecified: Secondary | ICD-10-CM | POA: Insufficient documentation

## 2022-01-22 ENCOUNTER — Other Ambulatory Visit: Payer: Self-pay | Admitting: Otolaryngology

## 2022-01-22 DIAGNOSIS — Z961 Presence of intraocular lens: Secondary | ICD-10-CM | POA: Diagnosis not present

## 2022-01-22 DIAGNOSIS — H5212 Myopia, left eye: Secondary | ICD-10-CM | POA: Diagnosis not present

## 2022-01-22 DIAGNOSIS — H40013 Open angle with borderline findings, low risk, bilateral: Secondary | ICD-10-CM | POA: Diagnosis not present

## 2022-01-22 DIAGNOSIS — E119 Type 2 diabetes mellitus without complications: Secondary | ICD-10-CM | POA: Diagnosis not present

## 2022-01-22 LAB — HM DIABETES EYE EXAM

## 2022-01-23 ENCOUNTER — Encounter: Payer: Self-pay | Admitting: Internal Medicine

## 2022-01-23 DIAGNOSIS — R531 Weakness: Secondary | ICD-10-CM | POA: Diagnosis not present

## 2022-01-23 DIAGNOSIS — R260 Ataxic gait: Secondary | ICD-10-CM | POA: Diagnosis not present

## 2022-01-23 DIAGNOSIS — M79604 Pain in right leg: Secondary | ICD-10-CM | POA: Diagnosis not present

## 2022-01-23 DIAGNOSIS — M48062 Spinal stenosis, lumbar region with neurogenic claudication: Secondary | ICD-10-CM | POA: Diagnosis not present

## 2022-01-24 ENCOUNTER — Encounter: Payer: Self-pay | Admitting: Internal Medicine

## 2022-01-27 ENCOUNTER — Telehealth: Payer: Self-pay

## 2022-01-27 NOTE — Telephone Encounter (Signed)
LM-01/27/22-Called pt. To inform her of cancelation in schedule and see if pt. Would like to be seen on 8/1. Unable to reach pt. [place].  Total time spent: 2 min.

## 2022-01-30 ENCOUNTER — Telehealth: Payer: Self-pay | Admitting: Nurse Practitioner

## 2022-01-30 ENCOUNTER — Other Ambulatory Visit: Payer: Self-pay | Admitting: Nurse Practitioner

## 2022-01-30 DIAGNOSIS — R0602 Shortness of breath: Secondary | ICD-10-CM

## 2022-01-30 DIAGNOSIS — R079 Chest pain, unspecified: Secondary | ICD-10-CM

## 2022-01-30 DIAGNOSIS — I10 Essential (primary) hypertension: Secondary | ICD-10-CM

## 2022-01-30 NOTE — Telephone Encounter (Signed)
patient calling to request referral to cardiology. She was seen in ED with recommendation to be referred to cardiology. She does not have a cardiologist and would like a referral. Please advise.

## 2022-02-03 DIAGNOSIS — G255 Other chorea: Secondary | ICD-10-CM | POA: Diagnosis not present

## 2022-02-03 DIAGNOSIS — M4712 Other spondylosis with myelopathy, cervical region: Secondary | ICD-10-CM | POA: Diagnosis not present

## 2022-02-03 DIAGNOSIS — R2681 Unsteadiness on feet: Secondary | ICD-10-CM | POA: Diagnosis not present

## 2022-02-03 DIAGNOSIS — G629 Polyneuropathy, unspecified: Secondary | ICD-10-CM | POA: Diagnosis not present

## 2022-02-04 ENCOUNTER — Telehealth: Payer: Self-pay

## 2022-02-04 DIAGNOSIS — G255 Other chorea: Secondary | ICD-10-CM | POA: Diagnosis not present

## 2022-02-04 DIAGNOSIS — G629 Polyneuropathy, unspecified: Secondary | ICD-10-CM | POA: Diagnosis not present

## 2022-02-04 NOTE — Telephone Encounter (Signed)
LM-02/05/22-Pt. Called and confirmed CP Initial office visit for 8/15 at 11:30AM. Precall questions completed.  Total time spent: 10 min.     LM-02/04/22-Chart prep started. Reviewing office visits, hospital visits, medications, labs.         LM-02/04/22-Chart prep started. Reviewing office visits, hospital visits, medications, labs. Chart prep was started under wrong protocol. Void as "other patient time".

## 2022-02-05 DIAGNOSIS — H57811 Brow ptosis, right: Secondary | ICD-10-CM | POA: Insufficient documentation

## 2022-02-05 DIAGNOSIS — J3489 Other specified disorders of nose and nasal sinuses: Secondary | ICD-10-CM | POA: Diagnosis not present

## 2022-02-05 DIAGNOSIS — M48062 Spinal stenosis, lumbar region with neurogenic claudication: Secondary | ICD-10-CM | POA: Diagnosis not present

## 2022-02-05 DIAGNOSIS — R531 Weakness: Secondary | ICD-10-CM | POA: Diagnosis not present

## 2022-02-05 DIAGNOSIS — G510 Bell's palsy: Secondary | ICD-10-CM | POA: Insufficient documentation

## 2022-02-05 DIAGNOSIS — E507 Other ocular manifestations of vitamin A deficiency: Secondary | ICD-10-CM | POA: Insufficient documentation

## 2022-02-05 DIAGNOSIS — M950 Acquired deformity of nose: Secondary | ICD-10-CM | POA: Diagnosis not present

## 2022-02-05 DIAGNOSIS — H02831 Dermatochalasis of right upper eyelid: Secondary | ICD-10-CM | POA: Insufficient documentation

## 2022-02-05 DIAGNOSIS — H02834 Dermatochalasis of left upper eyelid: Secondary | ICD-10-CM | POA: Diagnosis not present

## 2022-02-05 DIAGNOSIS — M79604 Pain in right leg: Secondary | ICD-10-CM | POA: Diagnosis not present

## 2022-02-05 DIAGNOSIS — R260 Ataxic gait: Secondary | ICD-10-CM | POA: Diagnosis not present

## 2022-02-06 NOTE — Telephone Encounter (Signed)
LM-02/06/22-Completed care plan.  Total time spent:10 min.    LM-02/06/22-Chart Prep started, Reviewing OV, Consults, Hospital visits, Labs and medication changes.  Chart prep complete.  Total time spent: 48 min.

## 2022-02-07 DIAGNOSIS — R260 Ataxic gait: Secondary | ICD-10-CM | POA: Diagnosis not present

## 2022-02-07 DIAGNOSIS — R531 Weakness: Secondary | ICD-10-CM | POA: Diagnosis not present

## 2022-02-07 DIAGNOSIS — M48062 Spinal stenosis, lumbar region with neurogenic claudication: Secondary | ICD-10-CM | POA: Diagnosis not present

## 2022-02-07 DIAGNOSIS — M79604 Pain in right leg: Secondary | ICD-10-CM | POA: Diagnosis not present

## 2022-02-10 DIAGNOSIS — Z6823 Body mass index (BMI) 23.0-23.9, adult: Secondary | ICD-10-CM | POA: Diagnosis not present

## 2022-02-10 DIAGNOSIS — M79641 Pain in right hand: Secondary | ICD-10-CM | POA: Diagnosis not present

## 2022-02-10 DIAGNOSIS — M79642 Pain in left hand: Secondary | ICD-10-CM | POA: Diagnosis not present

## 2022-02-10 NOTE — Progress Notes (Unsigned)
@Patient  ID: Crystal [name], female    DOB: [birth year], 71 y.o.   MRN: [id number]  No chief complaint on file.   Referring provider: Unk Pinto, MD  HPI: 71 year old female, never smoker followed for OSA intolerant of CPAP and bronchiectasis.  She is a patient of Dr. Juanetta Gosling and last seen in office on 08/20/2021.  Past medical history significant for hypertension, atherosclerosis, HLD, DM 2, GERD, OA, RLS, fibromyalgia, MDD, vitamin D deficiency.  She is followed by Dr. Lucia Gaskins with ENT for chronic rhinitis and with narrow nasal angles and alar collapse; awaiting sinus surgery.  TEST/EVENTS:  11/21/2015 PFTs: FEV1 2.51 (110%), FEV1% 85%, TLC 88%, DLCO 77% 12/04/2015 HST: AHI 30.7, SPO2 low 73%.  Severe obstructive sleep apnea. 10/01/2017 CT chest: 3 mm left lower lobe nodule, mild cylindrical BTX in the right side. 11/06/2021 HST: AHI 36.5, SPO2 low 73%.  Severe obstructive sleep apnea.  08/20/2021: OV with Dr. Halford Chessman.  Breathing overall stable.  Advised to continue as needed antitussives and expectorants for regional bronchiectasis in setting of reflux.  Patient wanted to know more information about inspire device that she has been intolerant of CPAP therapy. Has lost significant weight and feels like she is sleeping better.  Arranged for home sleep study for further evaluation of ongoing OSA.    11/11/2021: OV with Nasra Counce NP to discuss home sleep study results which showed that she still has severe obstructive sleep apnea.  She has been intolerant of CPAP therapy in the past due to her ongoing nasal problems.  She has tried multiple masks including fullface, nasal mask and nasal pillow.  She has tried wearing the machine numerous times but ends up taking it off halfway through the night.  Wants to move forward with inspire device if possible.  She denies any drowsy driving, morning headaches, narcolepsy or cataplexy.  RLS is well controlled on Requip.  Reports that breathing is overall  stable; has not had any flares recently. Referred to ENT for [facility] consultation.     Allergies  Allergen Reactions   Codeine Other (See Comments)    Headache   Statins Other (See Comments)    Extreme pain   Amitriptyline Other (See Comments)    Confusion and hallucinations   Erythromycin Base Rash    Flushing/red face.   Fish Oil Nausea Only   Flax Seed [Flax Seed Oil] Rash   Flaxseed (Linseed) Rash   Hydrocodone Itching and Rash        Morphine Nausea And Vomiting and Rash   Nsaids Nausea Only   Nuvigil [Armodafinil] Other (See Comments)    Unspecified   Sertraline Rash   Sinemet [Carbidopa W-Levodopa] Rash   Sulfa Antibiotics Rash   Tolmetin Nausea Only    Immunization History  Administered Date(s) Administered   DT (Pediatric) 01/03/2015   Influenza Split 03/30/2010, 03/31/2011, 05/14/2014, 02/27/2016, 04/16/2018   Influenza Whole 03/30/2010   Influenza, High Dose Seasonal PF 03/13/2015, 07/21/2019, 02/16/2020, 04/08/2021   Influenza-Unspecified 03/31/2011, 05/14/2014, 02/27/2016   PFIZER Comirnaty(Gray Top)Covid-19 Tri-Sucrose Vaccine 09/19/2019, 10/10/2019, 07/13/2020   PFIZER(Purple Top)SARS-COV-2 Vaccination 09/19/2019, 10/10/2019   Pfizer Covid-19 Vaccine Bivalent Booster 62yr & up 05/14/2021   Pneumococcal Conjugate-13 06/30/2005, 04/17/2015, 01/23/2019   Pneumococcal Polysaccharide-23 01/06/2017   Pneumococcal-Unspecified 06/30/2005   Td 06/30/2004   Td,absorbed, Preservative Free, Adult Use, Lf Unspecified 06/30/2004   Zoster Recombinat (Shingrix) 05/14/2021, 08/22/2021   Zoster, Live 07/01/2007    Past Medical History:  Diagnosis Date   Arthritis    ASCVD (  arteriosclerotic cardiovascular disease)    Closed nondisplaced subtrochanteric fracture of left femur ([place]) 09/03/2017   September 2018.  Status post surgery by Dr. Marcelino Scot   Depression    Dyspnea    Fibromyalgia    Gait disorder 10/26/2012   GERD (gastroesophageal reflux disease)    GI  bleed    Hyperlipidemia    Hypertension    Hypothyroid    Periprosthetic supracondylar fracture of femur, left  03/23/2017   Pneumonia    Pre-diabetes    Restless leg syndrome    Rhinitis 06/25/2010   Sleep apnea    Spinal headache    with the C section   TIA (transient ischemic attack)    3         Vitamin D deficiency     Tobacco History: Social History   Tobacco Use  Smoking Status Never   Passive exposure: Past  Smokeless Tobacco Never   Counseling given: Not Answered   Outpatient Medications Prior to Visit  Medication Sig Dispense Refill   benzonatate (TESSALON) 200 MG capsule TAKE 1 CAP THREE TIMES DAILY AS NEEDED FOR COUGH. 30 capsule 1   bisoprolol (ZEBETA) 5 MG tablet Take  1/2 to 1  tablet  every Morning  for BP (Patient not taking: Reported on 01/15/2022) 90 tablet 3   carboxymethylcellulose (REFRESH PLUS) 0.5 % SOLN Place 1 drop into both eyes 3 (three) times daily as needed (dry eyes).     Cholecalciferol 125 MCG (5000 UT) TABS Take 5,000 Units by mouth daily.     dexamethasone 0.5 MG/5ML elixir Take by mouth.     DULoxetine (CYMBALTA) 60 MG capsule TAKE 1 CAPSULE DAILY FOR CHRONIC PAIN 90 capsule 3   fenofibrate micronized (LOFIBRA) 134 MG capsule Take 134 mg by mouth daily before breakfast.     Ferrous Sulfate (IRON PO) Take 1 tablet by mouth daily.     fluconazole (DIFLUCAN) 150 MG tablet Take 150 mg by mouth daily.     fluticasone (FLONASE) 50 MCG/ACT nasal spray USE TWO SPRAYS IN EACH NOSTRIL DAILY AS NEEDED (Patient taking differently: 2 sprays at bedtime.) 48 g 3   gabapentin (NEURONTIN) 300 MG capsule TAKE 1 CAPSULE BY MOUTH THREE TIMES A DAY 90 capsule 2   hydrocortisone 2.5 % ointment Apply 1 application topically daily as needed (itching).     Lactobacillus (PROBIOTIC ACIDOPHILUS PO) Take 1 tablet by mouth daily.     levothyroxine (SYNTHROID) 88 MCG tablet Take  1 tablet  Daily  on an empty stomach with only water for 30 minutes & no Antacid meds,  Calcium or Magnesium for 4 hours & avoid Biotin 90 tablet 3   meclizine (ANTIVERT) 25 MG tablet TAKE 1 TABLET (25 MG TOTAL) BY MOUTH 3 (THREE) TIMES DAILY AS NEEDED FOR DIZZINESS. 90 tablet 0   metroNIDAZOLE (FLAGYL) 500 MG tablet Take 500 mg by mouth 2 (two) times daily.     Multiple Vitamin (MULTIVITAMIN WITH MINERALS) TABS tablet Take 1 tablet by mouth daily.     neomycin-polymyxin-hydrocortisone (CORTISPORIN) 3.5-10000-1 OTIC suspension Instill  6 to 8 drops  to affected ear  3 to 4 x /day  and occlude with cotton (Patient taking differently: As needed for ear infections: Instill  6 to 8 drops  to affected ear  3 to 4 x /day  and occlude with cotton) 10 mL 1   olmesartan (BENICAR) 40 MG tablet Take 20 mg by mouth daily. (Patient not taking: Reported on 01/15/2022)  omeprazole (PRILOSEC) 40 MG capsule Take 40 mg by mouth daily.     oxybutynin (DITROPAN) 5 MG tablet TAKE 1 TABLET TWICE A DAY FOR BLADDER CONTROL 180 tablet 3   polyethylene glycol (MIRALAX / GLYCOLAX) 17 g packet Take 17-34 g by mouth daily. Dose depends on degree of constipation     rOPINIRole (REQUIP) 2 MG tablet TAKE 1 TABLET FOUR TIMES A DAY FOR RESTLESS LEGS (Patient taking differently: Take 1 mg by mouth See admin instructions. Take 1 tablet in AM, 1 tablet at dinner time, and 2 tablets at bedtime.) 360 tablet 3   senna-docusate (SENOKOT-S) 8.6-50 MG tablet Take 2 tablets by mouth daily as needed for mild constipation.     traMADol (ULTRAM) 50 MG tablet Take by mouth.     vitamin B-12 (CYANOCOBALAMIN) 500 MCG tablet Take 500 mcg by mouth daily.     Wheat Dextrin (BENEFIBER DRINK MIX PO) Take 1 Scoop by mouth daily.     No facility-administered medications prior to visit.     Review of Systems:   Constitutional: No weight loss or gain, night sweats, fevers, chills, fatigue, or lassitude. HEENT: No headaches, difficulty swallowing, tooth/dental problems, or sore throat. No sneezing, itching, ear ache. + Chronic nasal  congestion CV:  No chest pain, orthopnea, PND, swelling in lower extremities, anasarca, dizziness, palpitations, syncope Resp: No shortness of breath with exertion or at rest. No excess mucus or change in color of mucus. No productive or non-productive. No hemoptysis. No wheezing.  No chest wall deformity GI:  No heartburn, indigestion, abdominal pain, nausea, vomiting, diarrhea, change in bowel habits, loss of appetite, bloody stools.  Skin: No rash, lesions, ulcerations MSK:  No joint pain or swelling.  No decreased range of motion.  No back pain. Neuro: No dizziness or lightheadedness.  Psych: No depression or anxiety. Mood stable.     Physical Exam:  There were no vitals taken for this visit.  GEN: Pleasant, interactive, well-appearing; in no acute distress. HEENT:  Normocephalic and atraumatic.  Chronic right facial droop.  PERRLA. Sclera white. Nasal turbinates pink, moist and patent bilaterally. No rhinorrhea present. Oropharynx pink and moist, without exudate or edema. No lesions, ulcerations, or postnasal drip.  NECK:  Supple w/ fair ROM. No JVD present. Normal carotid impulses w/o bruits. Thyroid symmetrical with no goiter or nodules palpated. No lymphadenopathy.   CV: RRR, no m/r/g, no peripheral edema. Pulses intact, +2 bilaterally. No cyanosis, pallor or clubbing. PULMONARY:  Unlabored, regular breathing. Clear bilaterally A&P w/o wheezes/rales/rhonchi. No accessory muscle use. No dullness to percussion. GI: BS present and normoactive. Soft, non-tender to palpation. No organomegaly or masses detected. No CVA tenderness. MSK: No erythema, warmth or tenderness. Cap refil <2 sec all extrem. No deformities or joint swelling noted.  Neuro: A/Ox3. No focal deficits noted.   Skin: Warm, no lesions or rashe Psych: Normal affect and behavior. Judgement and thought content appropriate.     Lab Results:  CBC    Component Value Date/Time   WBC 6.7 01/10/2022 1551   RBC 4.93  01/10/2022 1551   HGB 14.4 01/10/2022 1551   HGB 12.9 10/20/2012 1358   HCT 44.3 01/10/2022 1551   HCT 40.2 10/20/2012 1358   PLT 349 01/10/2022 1551   PLT 265 10/20/2012 1358   MCV 89.9 01/10/2022 1551   MCV 82.1 10/20/2012 1358   MCH 29.2 01/10/2022 1551   MCHC 32.5 01/10/2022 1551   RDW 14.8 01/10/2022 1551   RDW 16.9 (H) 10/20/2012  [place] 01/08/2022 1159   LYMPHSABS 1.3 10/20/2012 1358   MONOABS 0.7 01/19/2021 2316   MONOABS 0.6 10/20/2012 1358   EOSABS 90 01/08/2022 1159   EOSABS 0.1 10/20/2012 1358   BASOSABS 67 01/08/2022 1159   BASOSABS 0.1 10/20/2012 1358    BMET    Component Value Date/Time   NA 140 01/10/2022 1551   NA 142 03/28/2017 0000   NA 141 10/20/2012 1358   K 3.8 01/10/2022 1551   K 3.9 10/20/2012 1358   CL 101 01/10/2022 1551   CL 104 10/20/2012 1358   CO2 27 01/10/2022 1551   CO2 27 10/20/2012 1358   GLUCOSE 133 (H) 01/10/2022 1551   GLUCOSE 109 (H) 10/20/2012 1358   BUN 20 01/10/2022 1551   BUN 14 03/28/2017 0000   BUN 15.6 10/20/2012 1358   CREATININE 0.85 01/10/2022 1551   CREATININE 0.80 01/08/2022 1159   CREATININE 1.1 10/20/2012 1358   CALCIUM 10.3 01/10/2022 1551   CALCIUM 7.8 (L) 03/26/2017 0354   CALCIUM 9.7 10/20/2012 1358   GFRNONAA >60 01/10/2022 1551   GFRNONAA 80 07/04/2020 1457   GFRAA 93 07/04/2020 1457    BNP    Component Value Date/Time   BNP 32.3 01/19/2021 2316     Imaging:  No results found.       Latest Ref Rng & Units 11/21/2015   10:51 AM  PFT Results  FVC-Pre L 2.96   FVC-Predicted Pre % 98   FVC-Post L 2.88   FVC-Predicted Post % 95   Pre FEV1/FVC % % 85   Post FEV1/FCV % % 86   FEV1-Pre L 2.51   FEV1-Predicted Pre % 110   FEV1-Post L 2.49   DLCO uncorrected ml/min/mmHg 18.12   DLCO UNC% % 77   DLCO corrected ml/min/mmHg 17.65   DLCO COR %Predicted % 75   DLVA Predicted % 85   TLC L 4.36   TLC % Predicted % 88   RV % Predicted % 59     No results found for:  "NITRICOXIDE"      Assessment & Plan:   No problem-specific Assessment & Plan notes found for this encounter.    I spent 32 minutes of dedicated to the care of this patient on the date of this encounter to include pre-visit review of records, face-to-face time with the patient discussing conditions above, post visit ordering of testing, clinical documentation with the electronic health record, making appropriate referrals as documented, and communicating necessary findings to members of the patients care team.  Clayton Bibles, NP 02/10/2022  Pt aware and understands NP's role.

## 2022-02-11 ENCOUNTER — Ambulatory Visit: Payer: Medicare PPO | Admitting: Pharmacy Technician

## 2022-02-11 ENCOUNTER — Ambulatory Visit: Payer: Medicare PPO | Admitting: Nurse Practitioner

## 2022-02-11 ENCOUNTER — Encounter: Payer: Self-pay | Admitting: Nurse Practitioner

## 2022-02-11 VITALS — BP 116/84 | HR 108 | Temp 97.7°F | Ht 63.0 in | Wt 133.6 lb

## 2022-02-11 DIAGNOSIS — J479 Bronchiectasis, uncomplicated: Secondary | ICD-10-CM

## 2022-02-11 DIAGNOSIS — G720 Drug-induced myopathy: Secondary | ICD-10-CM

## 2022-02-11 DIAGNOSIS — R42 Dizziness and giddiness: Secondary | ICD-10-CM

## 2022-02-11 DIAGNOSIS — R0609 Other forms of dyspnea: Secondary | ICD-10-CM

## 2022-02-11 DIAGNOSIS — Z79899 Other long term (current) drug therapy: Secondary | ICD-10-CM

## 2022-02-11 DIAGNOSIS — N182 Chronic kidney disease, stage 2 (mild): Secondary | ICD-10-CM

## 2022-02-11 DIAGNOSIS — I10 Essential (primary) hypertension: Secondary | ICD-10-CM

## 2022-02-11 DIAGNOSIS — N3281 Overactive bladder: Secondary | ICD-10-CM

## 2022-02-11 DIAGNOSIS — G4733 Obstructive sleep apnea (adult) (pediatric): Secondary | ICD-10-CM | POA: Diagnosis not present

## 2022-02-11 DIAGNOSIS — E1169 Type 2 diabetes mellitus with other specified complication: Secondary | ICD-10-CM

## 2022-02-11 MED ORDER — ALBUTEROL SULFATE HFA 108 (90 BASE) MCG/ACT IN AERS: 2.0000 | INHALATION_SPRAY | Freq: Four times a day (QID) | RESPIRATORY_TRACT | 2 refills | Status: DC | PRN

## 2022-02-11 NOTE — Patient Instructions (Addendum)
Continue flonase 2 sprays each nostril daily  Continue tessalon perles (benzonatate) Three times a day as needed for cough Continue mucinex 600 mg Twice daily as needed for congestion  Albuterol 2 puffs every 6 hours as needed for shortness of breath or wheezing.   Echocardiogram ordered today  Pulmonary function testing scheduled today    Follow up with cardiology as scheduled   Follow up with Dr. Wilburn Cornelia for [facility] device   Follow up after PFTs with Dr. Halford Chessman or Alanson Aly. If symptoms do not improve or worsen, please contact office for sooner follow up or seek emergency care.

## 2022-02-11 NOTE — Assessment & Plan Note (Addendum)
No significant worsening on CTA chest from July. Cough is at her baseline and she has no significant chest congestion. Lung exam clear today. Not convinced her DOE is related; although, we will recheck PFTs to assess for worsening obstruction. Continue mucociliary clearance therapies as needed for cough/congestion.

## 2022-02-11 NOTE — Assessment & Plan Note (Signed)
She underwent lumbar surgery in March and reports increased DOE from baseline and dizziness since. She was evaluated in the ED on 7/14 with full workup and no remarkable findings. CTA negative for PE or acute cardiopulmonary process. Troponins neg and EKG with sinus tachycardia. Labs stable. PCP stopped her antihypertensives. She has been feeling better over the past few weeks. Awaiting appt with cardiology - likely needs heart monitor for evaluation of underlying arrhythmia. I am going to order echocardiogram for further evaluation as well; she has severe untreated OSA and is at increase risk for [facility]. May need right heart cath if echo unrevealing and symptoms persist. We also are going to set her up for PFTs for further evaluation. PRN albuterol rx sent today.   Patient Instructions  Continue flonase 2 sprays each nostril daily  Continue tessalon perles (benzonatate) Three times a day as needed for cough Continue mucinex 600 mg Twice daily as needed for congestion  Albuterol 2 puffs every 6 hours as needed for shortness of breath or wheezing.   Echocardiogram ordered today  Pulmonary function testing scheduled today    Follow up with cardiology as scheduled   Follow up with Dr. Wilburn Cornelia for [facility] device   Follow up after PFTs with Dr. Halford Chessman or Alanson Aly. If symptoms do not improve or worsen, please contact office for sooner follow up or seek emergency care.

## 2022-02-11 NOTE — Assessment & Plan Note (Signed)
Previous workup unremarkable. Possibly orthostatic as she has had some improvement with stopping antihypertensives. BP normal today. She has consult with cardiology this month. See above plan.

## 2022-02-11 NOTE — Assessment & Plan Note (Signed)
Severe untreated OSA. She has been evaluated by Dr. Wilburn Cornelia with ENT for [facility]. Awaiting sleep endoscopy to determine if she is a candidate.  Cautioned to be aware of drowsy driving.

## 2022-02-12 DIAGNOSIS — M79604 Pain in right leg: Secondary | ICD-10-CM | POA: Diagnosis not present

## 2022-02-12 DIAGNOSIS — R260 Ataxic gait: Secondary | ICD-10-CM | POA: Diagnosis not present

## 2022-02-12 DIAGNOSIS — R531 Weakness: Secondary | ICD-10-CM | POA: Diagnosis not present

## 2022-02-12 DIAGNOSIS — M48062 Spinal stenosis, lumbar region with neurogenic claudication: Secondary | ICD-10-CM | POA: Diagnosis not present

## 2022-02-13 DIAGNOSIS — B370 Candidal stomatitis: Secondary | ICD-10-CM | POA: Diagnosis not present

## 2022-02-13 DIAGNOSIS — L438 Other lichen planus: Secondary | ICD-10-CM | POA: Diagnosis not present

## 2022-02-13 NOTE — Progress Notes (Signed)
Pharmacist Visit  Crystal [name],Crystal [name] 23 years, Female  DOB: 14-Sep-1950  M: [phone]  __________________________________________________ Patient Chart Prep (HC) Patient's Chronic Conditions: Hypertension (HTN), Gastroesophageal Reflux Disease (GERD), Hyperlipidemia/Dyslipidemia (HLD), Diabetes (DM), Chronic Kidney Disease (CKD), Depression, Anemia, Cardiovascular Disease (CVD), Hypothyroidism, Vitamins/Supplements, Other List Other Conditions (separated by comma): OSA, IBS, Chron's Ileitis, RLS,  Fibromyalgia, Gait disorder, Vitamin D deficiency, Obesity,  B12 deficiency anemia, Polypharmacy  Summary for PCP:  1. Patient wanted to trial off Oxybutynin for 2 weeks. Will call and see if she has any changes. 2. Cardiology visit 02/24/22 3. Patient was requesting more pain control but I declined and said supplement with 2-3 Tylenol daily as needed. 4. Patient has care gap for A1c and Microalb screening. 5. PCP consider DEXA scan and initiating Prolia due to fracture status. 6. Not on CPAP but having sleep study to get Inspire device. 7. Having nasal surgery soon. 8. PCP consider Zetia or Repatha for better LDL control. 9. Patient is a fall risk. Counseling done during visit.  Disease Assessments Visit Completed on: 02/11/2022 Did patient bring medications to appointment?: Yes Subjective: Patient presenting in office for CCM initial visit. She brings in her medications today. She is grateful for the opportunity of this visit. She states she feels like shes on too many medications and would like to simplify her regimen. She also states she needs more pain control. She is married and spends most of her time going to doctor visits and recovering from surgeries/procedures. Couldn't think of any hobbies when asked. Lifestyle habits: Diet: Eats sporadically, currently not following any specific regimen. Exercise: None Tobacco: None Alcohol: In frequently Caffeine: 1 cup coffee Recreational drugs:  None  SDOH: Accountable Health Communities Health-Related Social Needs Screening Tool (BloggerBowl.es) SDOH questions were documented and reviewed (EMR or [employer]) within the past 12 months or since hospitalization?: No What is your living situation today? (ref #1): I have a steady place to live Think about the place you live. Do you have problems with any of the following? (ref #2): None of the above Within the past 12 months, you worried that your food would run out before you got money to buy more (ref #3): Never true Within the past 12 months, the food you bought just didn't last and you didn't have money to get more (ref #4): Never true In the past 12 months, has lack of reliable transportation kept you from medical appointments, meetings, work or from getting things needed for daily living? (ref #5): No In the past 12 months, has the electric, gas, oil, or water company threatened to shut off services in your home? (ref #6): No How often does anyone, including family and friends, physically hurt you? (ref #7): Never (1) How often does anyone, including family and friends, insult or talk down to you? (ref #8): Never (1) How often does anyone, including friends and family, threaten you with harm? (ref #9): Never (1) How often does anyone, including family and friends, scream or curse at you? (ref #10): Never (1)  Medication Adherence Does the [facility] have access to medication refill history?: Yes Medication adherence rates for STAR metric medications: Pt. not currently taking any STAR metric medications Medication adherence rates for non-STAR metric medications:  Fluticasone 69mg- 07/18/21 (90DS), 10/27/21 (90DS) Gabapentin 3033m 02/21/21 (30DS), 03/26/21 (30DS) Metronidazole 50036m9/22/22 (90DS), 08/16/21 (90DS) Tramadol 73m2m/20/23 (25DS), 11/08/21 (25DS)  Factors that may affect medication adherence?: Pill burden, Lack of  understanding / insight of condition, Perceived lack of  benefit of therapy [facility] of [facility] Current Rx insurance plan: [employer] Are meds synced by current pharmacy?: No Are meds delivered by current pharmacy?: No - delivery not available Would patient benefit from direct intervention of clinical lead in dispensing process to optimize clinical outcomes?: Yes Are [facility] services available where patient lives?: Yes Is patient disadvantaged to use [facility]?: No Medication organization: Pill carrier/box- really could not explain to me Additional Info: Patient had different medications mixed in with others. Pills in unlabeled bottles. Multiple bottles of same drug. I had patient toss all medications not labeled and toss all drugs mixed together for safety precautions. We discussed: Use of adherence aids (pillbox, alarms), Use of pill packaging Assessment:: Non-adherent Plan/Follow up: Will have [place] onboard to [facility]  Hypertension (HTN) Most Recent BP: 127/81 Most Recent HR: 115 taken on: 02/03/2022 Care Gap: Need BP documented or last BP 140/90 or higher: Addressed Assessed today?: Yes BP today is: 128/77 Heart Rate is: 90 Goal: <130/80 mmHG Is Patient checking BP at home?: No Has patient experienced hypotension, dizziness, falls or bradycardia?: Yes Details: Falls due to balance/unstable gait We discussed: DASH diet:  following a diet emphasizing fruits and vegetables and low-fat dairy products along with whole grains, fish, poultry, and nuts. Reducing red meats and sugars., Reducing the amount of salt intake to <15873m/per day., Getting enough potassium in your diet equaling 3500-50059mday.  This helps to regulate BP by balancing out the effects of salt., Proper Home BP Measurement, Hypertension pathophysiology, complications, treatment goals, and management with patient for 10-15 minutes, Increasing movement, Increasing exercise (walking, biking,  swimming) to a goal of 30 minutes per day, as able based on current activity level and health or as directed by your healthcare provider., Contacting PCP office for signs and symptoms of high or low blood pressure (hypotension, dizziness, falls, headaches, edema) Assessment:: Controlled Drug: Bisoprolol 73m69m/2-1 tab daily Pharmacist Assessment: Appropriate, Query Effectiveness Drug: Olmesartan 12m31mily Pharmacist Assessment: Appropriate, Effective, Safe, Accessible Plan/Follow up: Cardiology consult 02/24/22. BP today in office is at goal. HR is a bit elevated. Patient denies dizziness at this time.  Hyperlipidemia/Dyslipidemia (HLD) Last Lipid panel on: 10/21/2021 TC (Goal<200): 165 LDL: 90 HDL (Goal>40): 51 TG (Goal<150): 141 ASCVD 10-year risk?is:: N/A due to existing ASCVD Assessed today?: Yes LDL Goal: <70 Does patient have any of the following diagnosis codes that will exclude them from statin therapy?: Myopathy, unspecified - G72.9 Verified ICD-10 code G72.9 is submitted to EMR: Yes We discussed: Complications of hyperlipidemia and prevention methods with patient for 5-10 minutes, Increasing exercise (walking, biking, swimming) to a goal of 30 minutes per day, as able based on current activity level and health or as directed by your healthcare provider, How a diet high in fruits/vegetables/nuts/whole grains/beans may help to reduce your cholesterol. Increasing soluble fiber intake.  Avoiding sugary foods and trans fat, limiting carbohydrates, and reducing portion sizes. Recommended increasing intake of healthy fats into their diet, Weight reduction- We discussed losing 5-10% of body weight Assessment:: Uncontrolled Drug: Fenofibrate 134mg80mly Pharmacist Assessment: Query Appropriateness Plan/Follow up: PCP consider zetia or Repatha for better LDL lowering, cannot tolerate Statins  Diabetes (DM) Most recent A1C: 5.7 taken on: 07/16/2021 Previous A1C: 5.5 taken on:  04/08/2021 Most Recent GFR: >60 taken on: 01/10/2022 Type: 2 Most recent microalbumin ratio: <0.2mg t74med on: 04/11/2021 Care Gap: Statin therapy needed: Needs to be addressed Care Gap: Need A1c documented or last A1c > 9 %: Addressed Care  Gap: Need eye exam documented in EMR or by claim: Addressed Care Gap: Need eGFR and uACR for kidney health evaluation: Needs to be addressed Assessed today?: Yes Goal A1C: < 6.5 % Type: 2 Is Patient taking statin medication?: No Reason patient is not taking statin(s): Statin Myopathy Does patient have any of the following diagnosis codes that will exclude them from statin therapy?: Myopathy, unspecified - G72.9 Verified ICD-10 code G72.9 is submitted to EMR: Yes Is patient taking ACEi / ARB?: Yes Blood glucose monitoring: Not monitoring Has patient experienced any hypoglycemic episodes?: No We discussed: Low carbohydrate eating plan with an emphasis on whole grains, legumes, nuts, fruits, and vegetables and minimal refined and processed foods., Proper diabetic foot care including daily foot exams and wearing closed toe shoes., Healthy eating habits for DM with patient for 29-92 minutes, Complications of Type 2 DM and preventative measures with patient for 5-10 minutes, Avoiding both sugar-sweetened and artificially (aspartame) sweetened beverages and increase water intake., Scheduling a yearly eye exam, Scheduling a dental appointment twice yearly Assessment:: Controlled Drug: None Plan/Follow up: Counseled on dietary and lifestyle modifications  Chronic kidney disease (CKD) Most Recent GFR: >60 taken on: 01/10/2022 Previous GFR: 78 taken on: 01/08/2022 Most recent microalbumin ratio: <0.57mg/mg tested on: 04/11/2021 Assessed today?: Yes CKD Stage: Stage 2 (GFR 61-90 ml/min) Albuminuria Stage: A1 (<30) Contributing factors for developing CKD: Diabetes, HTN Is Patient taking statin medication: No Reason patient is not taking statin(s): Statin  Myopathy Does patient have any of the following diagnosis codes that will exclude them from statin therapy?: Myopathy, unspecified - G72.9 Verified ICD-10 code G72.9 is submitted to EMR: Yes Is patient taking ACEi / ARB?: Yes Renal dose adjustments recommended?: No We discussed: Limiting dietary sodium intake to less than 2000 mg / day, Maintaining blood pressure control, Maintaining blood glucose control, Avoidance of nephrotoxic drugs (NSAIDs) Assessment:: Controlled  Hypothyroidism Most recent TSH: 2.45 taken on: 01/08/2022 Previous TSH: 1.18 taken on: 10/21/2021 Most recent T4: 0.95 taken on: 02/13/2021 Most recent T3: Unknown Assessed today?: No Drug: Levothyroxine 88MCG- 1 tab QD Pharmacist Assessment: Appropriate, Effective, Safe, Accessible  Vitamins / Supplements We discussed: Lack of evidence for use of multivitamins, Confirming with pharmacist the safety of new vitamins / supplements with current medications before starting / adding to medication regimen, Separating divalent cation supplements from levothyroxine by at least 2 hours Drug: Cholecalciferol 1250m- Take 5,000U QD Pharmacist Assessment: Appropriate, Effective, Safe, Accessible Drug: Ferrous Sulfate- 1 tab QD Pharmacist Assessment: Appropriate, Effective, Safe, Accessible Drug: Probiotic- QD Pharmacist Assessment: Appropriate, Query Effectiveness Drug: Multivitamin- 1 tab QD Pharmacist Assessment: Appropriate, Query Effectiveness Drug: Miralax 17-34G QD Pharmacist Assessment: Appropriate, Effective, Safe, Accessible Drug: Senokot 8.6-50mg- 2 tab QD Pharmacist Assessment: Appropriate, Effective, Safe, Accessible Plan/Follow up: STOP Multivitamin Can use Tylenol as needed for extra pain  Preventative Health Care Gap: Colorectal cancer screening: Needs to be addressed Care Gap: Breast cancer screening: Addressed Care Gap: Annual Wellness Visit (AWV): Addressed Additional exercise counseling points. We  discussed: incorporating flexibility, balance, and strength training exercises, encouraging participation in insurance-covered exercise program through local gym (i.e. [employer]), decreasing sedentary behavior Additional diet counseling points. We discussed: key components of the DASH diet, key components of a low-carb eating plan, aiming to consume at least 8 cups of water day, limiting caffeine intake  CPP Prep: 1967mCPP OV: 48m64mPP Doc: 19mi46mP Care Plan: 11min79minical Summary Next CCM Follow Up: 05/06/22 Next AWV: 07/16/22 at 11:00AM Next PCP Visit: 04/08/22  at 10:00AM  Attestation Statement:: CCM Services:  This encounter meets complex CCM services and moderate to high medical decision making.  Prior to outreach and patient consent for Chronic Care Management, I referred this patient for services after reviewing the nominated patient list or from a personal encounter with the patient.  I have personally reviewed this encounter including the documentation in this note and have collaborated with the care management provider regarding care management and care coordination activities to include development and update of the comprehensive care plan I am certifying that I agree with the content of this note and encounter as supervising physician.  Pharmacy Interventions Intervention Details Pharmacist Interventions discussed: Yes Discontinued Therapy: No therapeutic indication, Adverse Effect, Inappropriate OTC use Started Therapy: Preventative therapy, Additive / synergistic therapy Monitoring: Overdue labs, Preventative health screenings, Routine monitoring Education: Lifestyle modifications, Medication administration  Marda Stalker, PharmD Clinical Pharmacist [email or web address] [phone]

## 2022-02-13 NOTE — Telephone Encounter (Signed)
LM-02/13/22-Downloaded and uploaded CP visit notes to pts. Documents.  Total time spent:4 min.

## 2022-02-14 DIAGNOSIS — M79604 Pain in right leg: Secondary | ICD-10-CM | POA: Diagnosis not present

## 2022-02-14 DIAGNOSIS — R531 Weakness: Secondary | ICD-10-CM | POA: Diagnosis not present

## 2022-02-14 DIAGNOSIS — M48062 Spinal stenosis, lumbar region with neurogenic claudication: Secondary | ICD-10-CM | POA: Diagnosis not present

## 2022-02-14 DIAGNOSIS — R260 Ataxic gait: Secondary | ICD-10-CM | POA: Diagnosis not present

## 2022-02-17 ENCOUNTER — Telehealth: Payer: Self-pay

## 2022-02-17 NOTE — Progress Notes (Signed)
Reviewed and agree with assessment/plan.   [name], MD [facility] Pulmonary/Critical Care 02/17/2022, 6:05 PM Pager:  [phone]

## 2022-02-17 NOTE — Telephone Encounter (Signed)
LM-02/17/22-Completing Medicare Cost Analysis to see if patient is eligible for [facility]. Medicare cost analysis completed. Mail order pharmacy is preferred over [facility].  Total time spent: 10 min.

## 2022-02-19 ENCOUNTER — Other Ambulatory Visit: Payer: Self-pay

## 2022-02-19 ENCOUNTER — Encounter (HOSPITAL_BASED_OUTPATIENT_CLINIC_OR_DEPARTMENT_OTHER): Payer: Self-pay | Admitting: Otolaryngology

## 2022-02-19 ENCOUNTER — Other Ambulatory Visit: Payer: Self-pay | Admitting: Nurse Practitioner

## 2022-02-19 ENCOUNTER — Telehealth: Payer: Self-pay | Admitting: Nurse Practitioner

## 2022-02-19 MED ORDER — FENOFIBRATE MICRONIZED 134 MG PO CAPS: 134.0000 mg | ORAL_CAPSULE | Freq: Every day | ORAL | 0 refills | Status: DC

## 2022-02-19 NOTE — Telephone Encounter (Signed)
[facility] [address]

## 2022-02-21 DIAGNOSIS — G255 Other chorea: Secondary | ICD-10-CM | POA: Diagnosis not present

## 2022-02-24 ENCOUNTER — Encounter: Payer: Self-pay | Admitting: Internal Medicine

## 2022-02-24 ENCOUNTER — Ambulatory Visit (INDEPENDENT_AMBULATORY_CARE_PROVIDER_SITE_OTHER): Payer: Medicare PPO

## 2022-02-24 ENCOUNTER — Ambulatory Visit: Payer: Medicare PPO | Attending: Internal Medicine | Admitting: Internal Medicine

## 2022-02-24 VITALS — BP 146/84 | HR 94 | Ht 63.5 in | Wt 133.0 lb

## 2022-02-24 DIAGNOSIS — R002 Palpitations: Secondary | ICD-10-CM

## 2022-02-24 NOTE — Patient Instructions (Signed)
Medication Instructions:  The current medical regimen is effective;  continue present plan and medications.  *If you need a refill on your cardiac medications before your next appointment, please call your pharmacy*   Testing/Procedures:  [place] Monitor Instructions  Your physician has requested you wear a ZIO patch monitor for 7 days.  This is a single patch monitor. [employer] supplies one patch monitor per enrollment. Additional stickers are not available. Please do not apply patch if you will be having a Nuclear Stress Test,  Echocardiogram, Cardiac CT, MRI, or Chest Xray during the period you would be wearing the  monitor. The patch cannot be worn during these tests. You cannot remove and re-apply the  ZIO XT patch monitor.  Your ZIO patch monitor will be mailed 3 day USPS to your address on file. It may take 3-5 days  to receive your monitor after you have been enrolled.  Once you have received your monitor, please review the enclosed instructions. Your monitor  has already been registered assigning a specific monitor serial # to you.  Billing and Patient Assistance Program Information  We have supplied [employer] with any of your insurance information on file for billing purposes. [employer] offers a sliding scale Patient Assistance Program for patients that do not have  insurance, or whose insurance does not completely cover the cost of the ZIO monitor.  You must apply for the Patient Assistance Program to qualify for this discounted rate.  To apply, please call [employer] at [phone], select option 4, select option 2, ask to apply for  Patient Assistance Program. Theodore Demark will ask your household income, and how many people  are in your household. They will quote your out-of-pocket cost based on that information.  [employer] will also be able to set up a 10-month interest-free payment plan if needed.  Applying the monitor   Shave hair from upper left chest.  Hold abrader  disc by orange tab. Rub abrader in 40 strokes over the upper left chest as  indicated in your monitor instructions.  Clean area with 4 enclosed alcohol pads. Let dry.  Apply patch as indicated in monitor instructions. Patch will be placed under collarbone on left  side of chest with arrow pointing upward.  Rub patch adhesive wings for 2 minutes. Remove white label marked "1". Remove the white  label marked "2". Rub patch adhesive wings for 2 additional minutes.  While looking in a mirror, press and release button in center of patch. A small green light will  flash 3-4 times. This will be your only indicator that the monitor has been turned on.  Do not shower for the first 24 hours. You may shower after the first 24 hours.  Press the button if you feel a symptom. You will hear a small click. Record Date, Time and  Symptom in the Patient Logbook.  When you are ready to remove the patch, follow instructions on the last 2 pages of Patient  Logbook. Stick patch monitor onto the last page of Patient Logbook.  Place Patient Logbook in the blue and white box. Use locking tab on box and tape box closed  securely. The blue and white box has prepaid postage on it. Please place it in the mailbox as  soon as possible. Your physician should have your test results approximately 7 days after the  monitor has been [facility]  Call [employer] [phone] you have questions regarding  your ZIO  XT patch monitor. Call them immediately if you see an orange light blinking on your  monitor.  If your monitor falls off in less than 4 days, contact our Monitor department at [phone].  If your monitor becomes loose or falls off after 4 days call [employer] at [phone] for  suggestions on securing your monitor    Follow-Up: At [facility], you and your health needs are our priority.  As part of our continuing mission to provide you with exceptional heart  care, we have created designated Provider Care Teams.  These Care Teams include your primary Cardiologist (physician) and Advanced Practice Providers (APPs -  Physician Assistants and Nurse Practitioners) who all work together to provide you with the care you need, when you need it.  We recommend signing up for the patient portal called "MyChart".  Sign up information is provided on this After Visit Summary.  MyChart is used to connect with patients for Virtual Visits (Telemedicine).  Patients are able to view lab/test results, encounter notes, upcoming appointments, etc.  Non-urgent messages can be sent to your provider as well.   To learn more about what you can do with MyChart, go to [email or web address].    Your next appointment:   As needed  The format for your next appointment:   In Person  Provider:   Phineas Inches, MD

## 2022-02-24 NOTE — Progress Notes (Unsigned)
Enrolled for [employer] to mail a ZIO XT long term holter monitor to the patients address on file.  

## 2022-02-24 NOTE — Progress Notes (Signed)
Cardiology Office Note:    Date:  02/24/2022   ID:  Crystal, [name] 01/14/1951, MRN [id number]  PCP:  Crystal [name], Crystal [name]:  None     Referring MD: Crystal Jump, NP   No chief complaint on file. Chest pain  History of Present Illness:    Crystal [name] is a 71 y.o. female with a hx of fibromyalgia, OSA -can't wear her CPAP/ending inspira, GERD, HTN, obesity, went to the ED at [facility] for CP and SOB 01/10/2022. Noted atypical CP with BL pain along the lower anterior ribs since the Spring. She had palpitations. Her w/u was unremarkable. EKG showed sinus tachycardia. She has an echo pending. She notes her blood pressures varies. She gets LH. She denies syncope With daily activities, she is limited with balance. No significant chest pressure or SOB. No smoking. She notes statin intolerance. She notes palpitations.    The 10-year ASCVD risk score (Arnett DK, et al., 2019) is: 28.8%   Values used to calculate the score:     Age: 89 years     Sex: Female     Is Non-Hispanic African American: No     Diabetic: Yes     Tobacco smoker: No     Systolic Blood Pressure: 938 mmHg     Is BP treated: No     HDL Cholesterol: 51 mg/dL     Total Cholesterol: 165 mg/dL   Past Medical History:  Diagnosis Date   Arthritis    ASCVD (arteriosclerotic cardiovascular disease)    Closed nondisplaced subtrochanteric fracture of left femur ([place]) 09/03/2017   September 2018.  Status post surgery by Dr. Marcelino [name]   Depression    Dyspnea    Fibromyalgia    Gait disorder 10/26/2012   GERD (gastroesophageal reflux disease)    GI bleed    Hyperlipidemia    Hypertension    Hypothyroid    Periprosthetic supracondylar fracture of femur, left  03/23/2017   Pneumonia    Pre-diabetes    Restless leg syndrome    Rhinitis 06/25/2010   Sleep apnea    does not use CPAP   Spinal headache    with the C section   TIA (transient  ischemic attack)    3         Vitamin D deficiency     Past Surgical History:  Procedure Laterality Date   ABDOMINAL HYSTERECTOMY  1992   ANTERIOR CERVICAL DECOMPRESSION/DISCECTOMY FUSION 4 LEVELS N/A 06/14/2015   Procedure: Cervical three-four Cervical four-five Cervical five- six Cervical six- seven  Anterior cervical decompression/diskectomy/fusion;  Surgeon: Crystal Levine, MD;  Location:  [facility];  Service: Neurosurgery;  Laterality: N/A;  C3-4 C4-5 C5-6 C6-7 Anterior cervical decompression/diskectomy/fusion   [place]     bilateral cataracts   JOINT REPLACEMENT     2   LEFT  1 RIGHT    KNEE SURGERY     ORIF FEMUR FRACTURE Left 03/24/2017   Procedure: OPEN REDUCTION INTERNAL FIXATION (ORIF) FEMUR FRACTURE;  Surgeon: Crystal Sylvania, MD;  Location: [place];  Service: Orthopedics;  Laterality: Left;   ROTATOR CUFF REPAIR Left 2005   2 LEFT  1  RIGHT   SHOULDER ADHESION RELEASE     SPINE SURGERY     TOE SURGERY     LEFT        TOE  SURGERY     RIGHT  BIG TOE   TONSILLECTOMY  1960   TRANSFORAMINAL LUMBAR INTERBODY FUSION W/ MIS 1 LEVEL Left 09/05/2021   Procedure: MINIMALLY INVASIVE TRANSFORAMINAL LUMBAR INTERBODY FUSION LUMBAR THREE-FOUR; EXPLORE FUSION WITH REVISION OF HARDWARE LUMBAR FOUR-FIVE,LEFT SIDE APPROACH;  Surgeon: Crystal Ro, DO;  Location: [place];  Service: Neurosurgery;  Laterality: Left;    Current Medications: Current Meds  Medication Sig   acetaminophen (TYLENOL) 500 MG tablet Take 500 mg by mouth every 6 (six) hours as needed.   albuterol (VENTOLIN HFA) 108 (90 Base) MCG/ACT inhaler Inhale 2 puffs into the lungs every 6 (six) hours as needed for wheezing or shortness of breath.   benzonatate (TESSALON) 200 MG capsule TAKE 1 CAP THREE TIMES DAILY AS NEEDED FOR COUGH.   buPROPion (WELLBUTRIN XL) 300 MG 24 hr tablet Take 150 mg by mouth daily.   carboxymethylcellulose (REFRESH PLUS) 0.5 %  SOLN Place 1 drop into both eyes 3 (three) times daily as needed (dry eyes).   clobetasol (TEMOVATE) 0.05 % GEL SMARTSIG:2 Topical Twice Daily   clotrimazole (MYCELEX) 10 MG troche Take 10 mg by mouth 3 (three) times daily.   diclofenac Sodium (VOLTAREN) 1 % GEL Apply topically 4 (four) times daily.   docusate sodium (COLACE) 100 MG capsule Take 100 mg by mouth 2 (two) times daily.   DULoxetine (CYMBALTA) 60 MG capsule TAKE 1 CAPSULE DAILY FOR CHRONIC PAIN   fenofibrate micronized (LOFIBRA) 134 MG capsule Take 1 capsule (134 mg total) by mouth daily before breakfast.   Ferrous Sulfate (IRON PO) Take 1 tablet by mouth daily.   fluticasone (FLONASE) 50 MCG/ACT nasal spray USE TWO SPRAYS IN EACH NOSTRIL DAILY AS NEEDED (Patient taking differently: 2 sprays at bedtime.)   gabapentin (NEURONTIN) 300 MG capsule TAKE 1 CAPSULE BY MOUTH THREE TIMES A DAY   hydroxychloroquine (PLAQUENIL) 200 MG tablet Take by mouth 2 (two) times daily.   levothyroxine (SYNTHROID) 88 MCG tablet Take  1 tablet  Daily  on an empty stomach with only water for 30 minutes & no Antacid meds, Calcium or Magnesium for 4 hours & avoid Biotin   meclizine (ANTIVERT) 25 MG tablet TAKE 1 TABLET (25 MG TOTAL) BY MOUTH 3 (THREE) TIMES DAILY AS NEEDED FOR DIZZINESS.   omeprazole (PRILOSEC) 40 MG capsule Take 40 mg by mouth daily.   polyethylene glycol (MIRALAX / GLYCOLAX) 17 g packet Take 17-34 g by mouth daily. Dose depends on degree of constipation   rOPINIRole (REQUIP) 2 MG tablet TAKE 1 TABLET FOUR TIMES A DAY FOR RESTLESS LEGS (Patient taking differently: Take 1 mg by mouth See admin instructions. Take 1 tablet in AM, 1 tablet at dinner time, and 2 tablets at bedtime.)   traMADol (ULTRAM) 50 MG tablet Take by mouth.     Allergies:   Codeine, Statins, Oxycodone, Amitriptyline, Erythromycin base, Fish oil, Flax seed [flax seed oil], Flaxseed (linseed), Hydrocodone, Morphine, Nsaids, Nuvigil [armodafinil], Sertraline, Sinemet [carbidopa  w-levodopa], Sulfa antibiotics, and Tolmetin   Social History   Socioeconomic History   Marital status: Married    Spouse name: Not on file   Number of children: 2   Years of education: Not on file   Highest education level: Not on file  Occupational History   Occupation: Retired Games developer: DISABLED  Tobacco Use   Smoking status: Never    Passive exposure: Past   Smokeless tobacco: Never  Vaping Use   Vaping Use: Never  used  Substance and Sexual Activity   Alcohol use: Yes    Comment: social   Drug use: No   Sexual activity: Not Currently  Other Topics Concern   Not on file  Social History Narrative   Drinks about 1 cup of coffee a day, occasional coke zero    Social Determinants of Radio broadcast assistant Strain: Not on file  Food Insecurity: Not on file  Transportation Needs: Not on file  Physical Activity: Not on file  Stress: Not on file  Social Connections: Not on file     Family History: The patient's family history includes Breast cancer in her mother; Cancer in her mother; Depression in her sister; Diabetes in her mother; Heart disease in her father; Hyperlipidemia in her father; Hypertension in her father; Leukemia in her sister; Lupus in her sister; Migraines in her daughter; Neuropathy in her father; Osteoporosis in her mother; Rheum arthritis in her sister.  ROS:   Please see the history of present illness.     All other systems reviewed and are negative.  EKGs/Labs/Other Studies Reviewed:    The following studies were reviewed today:   EKG:  EKG is  ordered today.  The ekg ordered today demonstrates   02/24/2022- NSR, poor r wave progression   Recent Labs: 04/08/2021: Magnesium 1.8 01/08/2022: ALT 9; TSH 2.45 01/10/2022: BUN 20; Creatinine, Ser 0.85; Hemoglobin 14.4; Platelets 349; Potassium 3.8; Sodium 140   Recent Lipid Panel    Component Value Date/Time   CHOL 165 10/21/2021 1127   TRIG 141 10/21/2021 1127   HDL 51  10/21/2021 1127   CHOLHDL 3.2 10/21/2021 1127   VLDL 35 (H) 01/06/2017 1538   LDLCALC 90 10/21/2021 1127     Risk Assessment/Calculations:         Physical Exam:    VS:   Vitals:   02/24/22 1347  BP: (!) 146/84  Pulse: 94  SpO2: 98%     Wt Readings from Last 3 Encounters:  02/24/22 133 lb (60.3 kg)  02/11/22 133 lb 9.6 oz (60.6 kg)  01/15/22 135 lb (61.2 kg)     GEN:   Well nourished, well developed in no acute distress HEENT: Normal NECK: No JVD; No carotid bruits LYMPHATICS: No lymphadenopathy CARDIAC: RRR, no murmurs, rubs, gallops RESPIRATORY:  Clear to auscultation without rales, wheezing or rhonchi  ABDOMEN: Soft, non-tender, non-distended MUSCULOSKELETAL:  No edema; No deformity  SKIN: Warm and dry NEUROLOGIC:  Alert and oriented x 3 PSYCHIATRIC:  Normal affect   ASSESSMENT:   Atypical CP: She reported atypical CP. Mostly concerned about palpitations. Will plan for a ziopatch. Low concern for coronary dx or heart failure  HLD: statin intolerant. On fenofibrate. Has had lower LDL before, she wants to trial diet again  PLAN:    In order of problems listed above:  Can forward her echo results if they are abnormal  1 week ziopatch  Follow up pending results           Medication Adjustments/Labs and Tests Ordered: Current medicines are reviewed at length with the patient today.  Concerns regarding medicines are outlined above.  Orders Placed This Encounter  Procedures   LONG TERM MONITOR (3-14 DAYS)   EKG 12-Lead   No orders of the defined types were placed in this encounter.   Patient Instructions  Medication Instructions:  The current medical regimen is effective;  continue present plan and medications.  *If you need a refill on your cardiac medications  before your next appointment, please call your pharmacy*   Testing/Procedures:  [place] Monitor Instructions  Your physician has requested you wear a ZIO patch monitor for  7 days.  This is a single patch monitor. [employer] supplies one patch monitor per enrollment. Additional stickers are not available. Please do not apply patch if you will be having a Nuclear Stress Test,  Echocardiogram, Cardiac CT, MRI, or Chest Xray during the period you would be wearing the  monitor. The patch cannot be worn during these tests. You cannot remove and re-apply the  ZIO XT patch monitor.  Your ZIO patch monitor will be mailed 3 day USPS to your address on file. It may take 3-5 days  to receive your monitor after you have been enrolled.  Once you have received your monitor, please review the enclosed instructions. Your monitor  has already been registered assigning a specific monitor serial # to you.  Billing and Patient Assistance Program Information  We have supplied [employer] with any of your insurance information on file for billing purposes. [employer] offers a sliding scale Patient Assistance Program for patients that do not have  insurance, or whose insurance does not completely cover the cost of the ZIO monitor.  You must apply for the Patient Assistance Program to qualify for this discounted rate.  To apply, please call [employer] at [phone], select option 4, select option 2, ask to apply for  Patient Assistance Program. Theodore Demark will ask your household income, and how many people  are in your household. They will quote your out-of-pocket cost based on that information.  [employer] will also be able to set up a 49-month interest-free payment plan if needed.  Applying the monitor   Shave hair from upper left chest.  Hold abrader disc by orange tab. Rub abrader in 40 strokes over the upper left chest as  indicated in your monitor instructions.  Clean area with 4 enclosed alcohol pads. Let dry.  Apply patch as indicated in monitor instructions. Patch will be placed under collarbone on left  side of chest with arrow pointing upward.  Rub patch adhesive wings for 2 minutes.  Remove white label marked "1". Remove the white  label marked "2". Rub patch adhesive wings for 2 additional minutes.  While looking in a mirror, press and release button in center of patch. A small green light will  flash 3-4 times. This will be your only indicator that the monitor has been turned on.  Do not shower for the first 24 hours. You may shower after the first 24 hours.  Press the button if you feel a symptom. You will hear a small click. Record Date, Time and  Symptom in the Patient Logbook.  When you are ready to remove the patch, follow instructions on the last 2 pages of Patient  Logbook. Stick patch monitor onto the last page of Patient Logbook.  Place Patient Logbook in the blue and white box. Use locking tab on box and tape box closed  securely. The blue and white box has prepaid postage on it. Please place it in the mailbox as  soon as possible. Your physician should have your test results approximately 7 days after the  monitor has been [facility]  Call [place]at [phone] you have questions regarding  your ZIO XT patch monitor. Call them immediately if you see an orange light blinking on your  monitor.  If your monitor falls off in  less than 4 days, contact our Monitor department at [phone].  If your monitor becomes loose or falls off after 4 days call [employer] at [phone] for  suggestions on securing your monitor    Follow-Up: At [facility], you and your health needs are our priority.  As part of our continuing mission to provide you with exceptional heart care, we have created designated Provider Care Teams.  These Care Teams include your primary [name] (physician) and Advanced Practice Providers (APPs -  Physician Assistants and Nurse Practitioners) who all work together to provide you with the care you need, when you need it.  We recommend signing up for the patient portal called "MyChart".   Sign up information is provided on this After Visit Summary.  MyChart is used to connect with patients for Virtual Visits (Telemedicine).  Patients are able to view lab/test results, encounter notes, upcoming appointments, etc.  Non-urgent messages can be sent to your provider as well.   To learn more about what you can do with MyChart, go to [email or web address].    Your next appointment:   As needed  The format for your next appointment:   In Person  Provider:   Phineas Inches, MD          Signed, Janina Mayo, MD  02/24/2022 2:19 PM    [place]

## 2022-02-25 ENCOUNTER — Encounter (HOSPITAL_BASED_OUTPATIENT_CLINIC_OR_DEPARTMENT_OTHER)
Admission: RE | Disposition: A | Discharge status: Home / Self Care | Payer: Self-pay | Source: Home / Self Care | Attending: Otolaryngology

## 2022-02-25 ENCOUNTER — Encounter (HOSPITAL_BASED_OUTPATIENT_CLINIC_OR_DEPARTMENT_OTHER): Payer: Self-pay | Admitting: Otolaryngology

## 2022-02-25 ENCOUNTER — Ambulatory Visit (HOSPITAL_BASED_OUTPATIENT_CLINIC_OR_DEPARTMENT_OTHER)
Admission: RE | Admit: 2022-02-25 | Discharge: 2022-02-25 | Disposition: A | Discharge status: Home / Self Care | Payer: Medicare PPO | Attending: Otolaryngology | Admitting: Otolaryngology

## 2022-02-25 ENCOUNTER — Other Ambulatory Visit (HOSPITAL_COMMUNITY): Payer: Medicare PPO

## 2022-02-25 ENCOUNTER — Ambulatory Visit (HOSPITAL_BASED_OUTPATIENT_CLINIC_OR_DEPARTMENT_OTHER): Payer: Medicare PPO | Admitting: Certified Registered"

## 2022-02-25 ENCOUNTER — Other Ambulatory Visit: Payer: Self-pay

## 2022-02-25 DIAGNOSIS — K219 Gastro-esophageal reflux disease without esophagitis: Secondary | ICD-10-CM | POA: Insufficient documentation

## 2022-02-25 DIAGNOSIS — E039 Hypothyroidism, unspecified: Secondary | ICD-10-CM

## 2022-02-25 DIAGNOSIS — M797 Fibromyalgia: Secondary | ICD-10-CM | POA: Insufficient documentation

## 2022-02-25 DIAGNOSIS — G4733 Obstructive sleep apnea (adult) (pediatric): Secondary | ICD-10-CM | POA: Diagnosis not present

## 2022-02-25 DIAGNOSIS — M1990 Unspecified osteoarthritis, unspecified site: Secondary | ICD-10-CM | POA: Diagnosis not present

## 2022-02-25 DIAGNOSIS — I10 Essential (primary) hypertension: Secondary | ICD-10-CM | POA: Insufficient documentation

## 2022-02-25 DIAGNOSIS — E119 Type 2 diabetes mellitus without complications: Secondary | ICD-10-CM | POA: Diagnosis not present

## 2022-02-25 DIAGNOSIS — Z7722 Contact with and (suspected) exposure to environmental tobacco smoke (acute) (chronic): Secondary | ICD-10-CM

## 2022-02-25 DIAGNOSIS — Z8673 Personal history of transient ischemic attack (TIA), and cerebral infarction without residual deficits: Secondary | ICD-10-CM | POA: Insufficient documentation

## 2022-02-25 DIAGNOSIS — Z01818 Encounter for other preprocedural examination: Secondary | ICD-10-CM

## 2022-02-25 HISTORY — PX: DRUG INDUCED ENDOSCOPY: SHX6808

## 2022-02-25 SURGERY — DRUG INDUCED SLEEP ENDOSCOPY
Anesthesia: Monitor Anesthesia Care | Site: Nose

## 2022-02-25 MED ORDER — FENTANYL CITRATE (PF) 100 MCG/2ML IJ SOLN: 25.0000 ug | INTRAMUSCULAR | Status: DC | PRN

## 2022-02-25 MED ORDER — PROPOFOL 500 MG/50ML IV EMUL
INTRAVENOUS | Status: DC | PRN
  Administered 2022-02-25: 35 ug/kg/min via INTRAVENOUS

## 2022-02-25 MED ORDER — LACTATED RINGERS IV SOLN: INTRAVENOUS | Status: DC

## 2022-02-25 MED ORDER — ONDANSETRON HCL 4 MG/2ML IJ SOLN: 4.0000 mg | Freq: Four times a day (QID) | INTRAMUSCULAR | Status: DC | PRN

## 2022-02-25 MED ORDER — LIDOCAINE 2% (20 MG/ML) 5 ML SYRINGE
INTRAMUSCULAR | Status: DC | PRN
  Administered 2022-02-25: 30 mg via INTRAVENOUS

## 2022-02-25 SURGICAL SUPPLY — 13 items
CANISTER SUCT 1200ML W/VALVE (MISCELLANEOUS) ×2 IMPLANT
GLOVE BIOGEL M 7.0 STRL (GLOVE) ×2 IMPLANT
KIT CLEAN ENDO (MISCELLANEOUS) ×2 IMPLANT
NDL HYPO 27GX1-1/4 (NEEDLE) IMPLANT
NEEDLE HYPO 27GX1-1/4 (NEEDLE) IMPLANT
PATTIES SURGICAL .5 X3 (DISPOSABLE) IMPLANT
SHEET MEDIUM DRAPE 40X70 STRL (DRAPES) ×2 IMPLANT
SOL ANTI FOG 6CC (MISCELLANEOUS) ×2 IMPLANT
SOLUTION ANTI FOG 6CC (MISCELLANEOUS) ×1
SPONGE NEURO XRAY DETECT 1X3 (DISPOSABLE) IMPLANT
SYR CONTROL 10ML LL (SYRINGE) IMPLANT
TOWEL GREEN STERILE FF (TOWEL DISPOSABLE) ×2 IMPLANT
TUBE CONNECTING 20X1/4 (TUBING) IMPLANT

## 2022-02-25 NOTE — Transfer of Care (Signed)
Immediate Anesthesia Transfer of Care Note  Patient: Crystal [name]  Procedure(s) Performed: DRUG INDUCED SLEEP ENDOSCOPY (Nose)  Patient Location: PACU  Anesthesia Type:MAC  Level of Consciousness: awake, alert , oriented and patient cooperative  Airway & Oxygen Therapy: Patient Spontanous Breathing  Post-op Assessment: Report given to RN and Post -op Vital signs reviewed and stable  Post vital signs: Reviewed and stable  Last Vitals:  Vitals Value Taken Time  BP    Temp    Pulse 76 02/25/22 0951  Resp 7 02/25/22 0951  SpO2 93 % 02/25/22 0951  Vitals shown include unvalidated device data.  Last Pain:  Vitals:   02/25/22 0817  TempSrc: Oral  PainSc: 7       Patients Stated Pain Goal: 5 (80/99/83 3825)  Complications: No notable events documented.

## 2022-02-25 NOTE — H&P (Signed)
Crystal [name] is an 71 y.o. female.   Chief Complaint: Obstructive sleep apnea HPI: Patient with a history of moderately severe obstructive sleep apnea, unable to tolerate CPAP for long-term treatment.  Past Medical History:  Diagnosis Date   Arthritis    ASCVD (arteriosclerotic cardiovascular disease)    Closed nondisplaced subtrochanteric fracture of left femur ([place]) 09/03/2017   September 2018.  Status post surgery by Dr. Marcelino Scot   Depression    Dyspnea    Fibromyalgia    Gait disorder 10/26/2012   GERD (gastroesophageal reflux disease)    GI bleed    Hyperlipidemia    Hypertension    Hypothyroid    Periprosthetic supracondylar fracture of femur, left  03/23/2017   Pneumonia    Pre-diabetes    Restless leg syndrome    Rhinitis 06/25/2010   Sleep apnea    does not use CPAP   Spinal headache    with the C section   TIA (transient ischemic attack)    3         Vitamin D deficiency     Past Surgical History:  Procedure Laterality Date   ABDOMINAL HYSTERECTOMY  1992   ANTERIOR CERVICAL DECOMPRESSION/DISCECTOMY FUSION 4 LEVELS N/A 06/14/2015   Procedure: Cervical three-four Cervical four-five Cervical five- six Cervical six- seven  Anterior cervical decompression/diskectomy/fusion;  Surgeon: Erline Levine, MD;  Location: [place] [facility];  Service: Neurosurgery;  Laterality: N/A;  C3-4 C4-5 C5-6 C6-7 Anterior cervical decompression/diskectomy/fusion   [place]     bilateral cataracts   JOINT REPLACEMENT     2   LEFT  1 RIGHT    KNEE SURGERY     ORIF FEMUR FRACTURE Left 03/24/2017   Procedure: OPEN REDUCTION INTERNAL FIXATION (ORIF) FEMUR FRACTURE;  Surgeon: Altamese Liberty, MD;  Location: [place];  Service: Orthopedics;  Laterality: Left;   ROTATOR CUFF REPAIR Left 2005   2 LEFT  1  RIGHT   SHOULDER ADHESION RELEASE     SPINE SURGERY     TOE SURGERY     LEFT        TOE SURGERY     RIGHT   BIG TOE   TONSILLECTOMY  1960   TRANSFORAMINAL LUMBAR INTERBODY FUSION W/ MIS 1 LEVEL Left 09/05/2021   Procedure: MINIMALLY INVASIVE TRANSFORAMINAL LUMBAR INTERBODY FUSION LUMBAR THREE-FOUR; EXPLORE FUSION WITH REVISION OF HARDWARE LUMBAR FOUR-FIVE,LEFT SIDE APPROACH;  Surgeon: Karsten Ro, DO;  Location: [place];  Service: Neurosurgery;  Laterality: Left;    Family History  Problem Relation Age of Onset   Leukemia Sister    Lupus Sister    Depression Sister    Rheum arthritis Sister    Heart disease Father    Hypertension Father    Hyperlipidemia Father    Neuropathy Father    Cancer Mother    Diabetes Mother    Osteoporosis Mother    Breast cancer Mother    Migraines Daughter    Social History:  reports that she has never smoked. She has been exposed to tobacco smoke. She has never used smokeless tobacco. She reports current alcohol use. She reports that she does not use drugs.  Allergies:  Allergies  Allergen Reactions   Codeine Other (See Comments)    Headache   Statins Other (See Comments)    Extreme pain   Oxycodone     Dizziness, unbalanced.  Amitriptyline Other (See Comments)    Confusion and hallucinations   Erythromycin Base Rash    Flushing/red face.   Fish Oil Nausea Only   Flax Seed [Flax Seed Oil] Rash   Flaxseed (Linseed) Rash   Hydrocodone Itching and Rash        Morphine Nausea And Vomiting and Rash   Nsaids Nausea Only   Nuvigil [Armodafinil] Other (See Comments)    Unspecified   Sertraline Rash   Sinemet [Carbidopa W-Levodopa] Rash   Sulfa Antibiotics Rash   Tolmetin Nausea Only    Medications Prior to Admission  Medication Sig Dispense Refill   acetaminophen (TYLENOL) 500 MG tablet Take 500 mg by mouth every 6 (six) hours as needed.     benzonatate (TESSALON) 200 MG capsule TAKE 1 CAP THREE TIMES DAILY AS NEEDED FOR COUGH. 30 capsule 1   buPROPion (WELLBUTRIN XL) 300 MG 24 hr tablet Take 150 mg by mouth daily.     carboxymethylcellulose  (REFRESH PLUS) 0.5 % SOLN Place 1 drop into both eyes 3 (three) times daily as needed (dry eyes).     clobetasol (TEMOVATE) 0.05 % GEL SMARTSIG:2 Topical Twice Daily     clotrimazole (MYCELEX) 10 MG troche Take 10 mg by mouth 3 (three) times daily.     diclofenac Sodium (VOLTAREN) 1 % GEL Apply topically 4 (four) times daily.     docusate sodium (COLACE) 100 MG capsule Take 100 mg by mouth 2 (two) times daily.     DULoxetine (CYMBALTA) 60 MG capsule TAKE 1 CAPSULE DAILY FOR CHRONIC PAIN 90 capsule 3   fenofibrate micronized (LOFIBRA) 134 MG capsule Take 1 capsule (134 mg total) by mouth daily before breakfast. 90 capsule 0   Ferrous Sulfate (IRON PO) Take 1 tablet by mouth daily.     fluticasone (FLONASE) 50 MCG/ACT nasal spray USE TWO SPRAYS IN EACH NOSTRIL DAILY AS NEEDED (Patient taking differently: 2 sprays at bedtime.) 48 g 3   gabapentin (NEURONTIN) 300 MG capsule TAKE 1 CAPSULE BY MOUTH THREE TIMES A DAY 90 capsule 2   hydroxychloroquine (PLAQUENIL) 200 MG tablet Take by mouth 2 (two) times daily.     levothyroxine (SYNTHROID) 88 MCG tablet Take  1 tablet  Daily  on an empty stomach with only water for 30 minutes & no Antacid meds, Calcium or Magnesium for 4 hours & avoid Biotin 90 tablet 3   meclizine (ANTIVERT) 25 MG tablet TAKE 1 TABLET (25 MG TOTAL) BY MOUTH 3 (THREE) TIMES DAILY AS NEEDED FOR DIZZINESS. 90 tablet 0   omeprazole (PRILOSEC) 40 MG capsule Take 40 mg by mouth daily.     polyethylene glycol (MIRALAX / GLYCOLAX) 17 g packet Take 17-34 g by mouth daily. Dose depends on degree of constipation     rOPINIRole (REQUIP) 2 MG tablet TAKE 1 TABLET FOUR TIMES A DAY FOR RESTLESS LEGS (Patient taking differently: Take 1 mg by mouth See admin instructions. Take 1 tablet in AM, 1 tablet at dinner time, and 2 tablets at bedtime.) 360 tablet 3   traMADol (ULTRAM) 50 MG tablet Take by mouth.     albuterol (VENTOLIN HFA) 108 (90 Base) MCG/ACT inhaler Inhale 2 puffs into the lungs every 6 (six)  hours as needed for wheezing or shortness of breath. 8 g 2    No results found for this or any previous visit (from the past 48 hour(s)). No results found.  Review of Systems  Respiratory:  Positive for apnea.     Blood  pressure 135/84, pulse 96, temperature 98.4 F (36.9 C), temperature source Oral, resp. rate 15, height 5' 3"  (1.6 m), weight 60.2 kg, SpO2 100 %. Physical Exam Constitutional:      Appearance: Normal appearance.  Cardiovascular:     Rate and Rhythm: Normal rate.     Pulses: Normal pulses.  Pulmonary:     Effort: Pulmonary effort is normal.  Musculoskeletal:     Cervical back: Normal range of motion.  Neurological:     Mental Status: She is alert.      Assessment/Plan Patient admitted for outpatient surgery under sedated anesthesia: Drug-induced sleep endoscopy.  Jerrell Belfast, MD 02/25/2022, 9:17 AM

## 2022-02-25 NOTE — Anesthesia Procedure Notes (Signed)
Procedure Name: MAC Date/Time: 02/25/2022 9:47 AM  Performed by: Signe Colt, CRNAPre-anesthesia Checklist: Patient identified, Emergency Drugs available, Suction available, Patient being monitored and Timeout performed Patient Re-evaluated:Patient Re-evaluated prior to induction

## 2022-02-25 NOTE — Discharge Instructions (Signed)

## 2022-02-25 NOTE — Anesthesia Preprocedure Evaluation (Signed)
Anesthesia Evaluation  Patient identified by MRN, date of birth, ID band Patient awake    Reviewed: Allergy & Precautions, H&P , NPO status , Patient's Chart, lab work & pertinent test results  Airway Mallampati: II   Neck ROM: full    Dental   Pulmonary shortness of breath, sleep apnea ,    breath sounds clear to auscultation       Cardiovascular hypertension,  Rhythm:regular Rate:Normal     Neuro/Psych  Headaches, TIA   GI/Hepatic GERD  ,  Endo/Other  diabetesHypothyroidism   Renal/GU      Musculoskeletal  (+) Arthritis , Fibromyalgia -  Abdominal   Peds  Hematology   Anesthesia Other Findings   Reproductive/Obstetrics                             Anesthesia Physical Anesthesia Plan  ASA: 2  Anesthesia Plan: General   Post-op Pain Management:    Induction: Intravenous  PONV Risk Score and Plan: 3 and Propofol infusion and Treatment may vary due to age or medical condition  Airway Management Planned: Nasal Cannula  Additional Equipment:   Intra-op Plan:   Post-operative Plan:   Informed Consent: I have reviewed the patients History and Physical, chart, labs and discussed the procedure including the risks, benefits and alternatives for the proposed anesthesia with the patient or authorized representative who has indicated his/her understanding and acceptance.     Dental advisory given  Plan Discussed with: CRNA, Anesthesiologist and Surgeon  Anesthesia Plan Comments:         Anesthesia Quick Evaluation

## 2022-02-25 NOTE — Op Note (Signed)
Operative Note: DRUG INDUCED SLEEP ENDOSCOPY  Patient: Crystal [name]  Medical record number: [id number]  Date:02/25/2022  Pre-operative Indications: 1.  Obstructive Sleep Apnea  Postoperative Indications: Same  Surgical Procedure: 1.  Drug Induced Sleep Endoscopy (DISE)  Anesthesia: MAC with IV sedation  Surgeon: Delsa Bern, M.D.  Complications: None  BMI: 23.5 kg/m  EBL: None  Findings: There is no evidence of complete concentric palatal obstruction.  Anatomically the patient should be a candidate for hypoglossal nerve stimulation therapy.     Brief History: The patient is a 71 y.o. female with a history of obstructive sleep apnea. The patient has undergone previous sleep study which showed mild to moderate levels of obstructive sleep apnea.  The patient was prescribed CPAP which they consistently attempted to use without success.  Given the patient's history and findings, the above drug-induced sleep endoscopy was recommended to assess the patient's anatomic level of apnea.  Risks and benefits were discussed in detail with the patient today understand and agree with our plan for surgery which is scheduled at [facility] under sedated anesthesia as an outpatient.  Surgical Procedure: The patient is brought to the operating room on 02/25/2022 and placed in supine position on the operating table.  Intravenous sedated anesthesia was established without difficulty using the standard drug-induced sleep endoscopy protocol. When the patient was adequately anesthetized, surgical timeout was performed and correct identification of the patient and the surgical procedure.    A propofol infusion was administered and the patient was monitored carefully to achieve a level of sedation appropriate for DISE.  The patient did not respond to verbal commands but still had spontaneous respiration, sleep disordered breathing and associated desaturations were observed.  With the patient  under adequate sedated anesthesia the flexible nasal laryngoscope was passed without difficulty.  The patient's nasal cavity showed septal deviation with partial obstruction.  The endoscope was then passed to visualize the velopharynx, oropharynx, tongue base and epiglottis to assess areas of obstruction.  Patient's airway showed significant anterior to posterior airway collapse involving the palate, soft tissue and tongue base.   There was no evidence of complete concentric palatal obstruction and the patient appeared to be a candidate anatomically for hypoglossal nerve stimulation therapy.  Surgical sponge count was correct. Patient was awakened from anesthetic and transferred from the operating room to the recovery room in stable condition. There were no complications and no blood loss.   Delsa Bern, M.D. [facility] 02/25/2022

## 2022-02-26 ENCOUNTER — Encounter (HOSPITAL_BASED_OUTPATIENT_CLINIC_OR_DEPARTMENT_OTHER): Payer: Self-pay | Admitting: Otolaryngology

## 2022-02-26 ENCOUNTER — Telehealth: Payer: Self-pay | Admitting: Nurse Practitioner

## 2022-02-26 ENCOUNTER — Telehealth: Payer: Self-pay

## 2022-02-26 DIAGNOSIS — R002 Palpitations: Secondary | ICD-10-CM

## 2022-02-26 NOTE — Anesthesia Postprocedure Evaluation (Signed)
Anesthesia Post Note  Patient: Crystal [name]  Procedure(s) Performed: DRUG INDUCED SLEEP ENDOSCOPY (Nose)     Patient location during evaluation: PACU Anesthesia Type: MAC Level of consciousness: awake and alert Pain management: pain level controlled Vital Signs Assessment: post-procedure vital signs reviewed and stable Respiratory status: spontaneous breathing, nonlabored ventilation, respiratory function stable and patient connected to nasal cannula oxygen Cardiovascular status: stable and blood pressure returned to baseline Postop Assessment: no apparent nausea or vomiting Anesthetic complications: no   No notable events documented.  Last Vitals:  Vitals:   02/25/22 1000 02/25/22 1021  BP: 126/78 133/70  Pulse: 78 77  Resp: 17 18  Temp:  (!) 36.3 C  SpO2: 99% 100%    Last Pain:  Vitals:   02/25/22 1021  TempSrc:   PainSc: 0-No pain                 Angelyna Henderson S

## 2022-02-26 NOTE — Telephone Encounter (Signed)
Patient called to get urgent advice from doctor regarding her going to her heart doctor today and she is now getting ready to put on a Zio monitor.  She stated that she has an upcoming Echo test and the paperwork that she has said she should not use a Zio monitor.  Patient is confused and would like to know what she should do.  Please advise asap.  CB# [phone]

## 2022-02-26 NOTE — Telephone Encounter (Signed)
Patient is returning phone call. Patient phone number is [phone].

## 2022-02-26 NOTE — Telephone Encounter (Signed)
LM-02/26/22-Called pt. To assess OAB symptoms since stopping Oxybutin. Unable to reach pt. [place] X1.  Total time spent: 2 min

## 2022-02-26 NOTE — Telephone Encounter (Signed)
ATC x1.  LVM to return call. 

## 2022-02-27 DIAGNOSIS — N182 Chronic kidney disease, stage 2 (mild): Secondary | ICD-10-CM | POA: Diagnosis not present

## 2022-02-27 DIAGNOSIS — I10 Essential (primary) hypertension: Secondary | ICD-10-CM | POA: Diagnosis not present

## 2022-02-27 DIAGNOSIS — E1169 Type 2 diabetes mellitus with other specified complication: Secondary | ICD-10-CM | POA: Diagnosis not present

## 2022-02-27 DIAGNOSIS — E785 Hyperlipidemia, unspecified: Secondary | ICD-10-CM | POA: Diagnosis not present

## 2022-02-27 NOTE — Telephone Encounter (Signed)
LM-02/27/22-Called pt. To assess OAB symptoms since stopping Oxybutynin. Unable to reach pt. Will reach out at another time.  Total time spent: 2 min.

## 2022-02-27 NOTE — Telephone Encounter (Signed)
Transferred patient over to Cardiology to resc Echo

## 2022-02-27 NOTE — Telephone Encounter (Signed)
Attempted to call pt but line went directly to VM. Left message for her to return call. °

## 2022-02-27 NOTE — Telephone Encounter (Signed)
Spoke with the pt  She states that she is currently wearing a Zio cardiac monitor for a wk  She is unable to go through with ECHO that Katie ordered that she is scheduled for tomorrow  PCC's please reschedule this and call pt with new appt  Thank you

## 2022-02-28 ENCOUNTER — Encounter: Payer: Self-pay | Admitting: Internal Medicine

## 2022-02-28 ENCOUNTER — Ambulatory Visit (HOSPITAL_COMMUNITY): Payer: Medicare PPO | Attending: Nurse Practitioner

## 2022-02-28 DIAGNOSIS — R0609 Other forms of dyspnea: Secondary | ICD-10-CM | POA: Insufficient documentation

## 2022-02-28 DIAGNOSIS — G4733 Obstructive sleep apnea (adult) (pediatric): Secondary | ICD-10-CM | POA: Diagnosis not present

## 2022-02-28 LAB — ECHOCARDIOGRAM COMPLETE
Area-P 1/2: 5.97 cm2
S' Lateral: 2.5 cm

## 2022-03-05 ENCOUNTER — Encounter: Payer: Self-pay | Admitting: Nurse Practitioner

## 2022-03-05 ENCOUNTER — Telehealth: Payer: Self-pay

## 2022-03-05 ENCOUNTER — Ambulatory Visit: Payer: Medicare PPO | Admitting: Nurse Practitioner

## 2022-03-05 VITALS — BP 130/70 | HR 92 | Temp 97.9°F | Ht 63.0 in | Wt 135.0 lb

## 2022-03-05 DIAGNOSIS — M1990 Unspecified osteoarthritis, unspecified site: Secondary | ICD-10-CM | POA: Diagnosis not present

## 2022-03-05 DIAGNOSIS — Z79899 Other long term (current) drug therapy: Secondary | ICD-10-CM | POA: Diagnosis not present

## 2022-03-05 DIAGNOSIS — G2581 Restless legs syndrome: Secondary | ICD-10-CM

## 2022-03-05 DIAGNOSIS — M4802 Spinal stenosis, cervical region: Secondary | ICD-10-CM | POA: Diagnosis not present

## 2022-03-05 DIAGNOSIS — D508 Other iron deficiency anemias: Secondary | ICD-10-CM | POA: Diagnosis not present

## 2022-03-05 NOTE — Telephone Encounter (Signed)
LM-03/05/22-Called pt. To assess OAB symptoms since discontinuing her Oxybutynin. Unable to reach pt. [place] X2.  Total time spent: 2 min.

## 2022-03-05 NOTE — Progress Notes (Signed)
Assessment and Plan:  Crystal [name] was seen today for a follow up.  Diagnoses and all order for this visit:  1. Arthritis/spinal stenosis Refer to pain management  - CBC with Differential/Platelet  2. RLS (restless legs syndrome) Stay well hydrated.  - CBC with Differential/Platelet  3. Other iron deficiency anemia Discussed iron rich foods such as lean red meat and green leafy veggies.  - Iron, TIBC and Ferritin Panel  4.  Medication Management All medications discussed and reviewed in full. All questions and concerns regarding medications addressed.     Orders Placed This Encounter  Procedures   CBC with Differential/Platelet   Iron, TIBC and Ferritin Panel   Iron, TIBC and Ferritin Panel   Ambulatory referral to Pain Clinic    Referral Priority:   Routine    Referral Type:   Consultation    Referral Reason:   Specialty Services Required    Requested Specialty:   Pain Medicine    Number of Visits Requested:   1   Notify office for further evaluation and treatment, questions or concerns if s/s fail to improve. The risks and benefits of my recommendations, as well as other treatment options were discussed with the patient today. Questions were answered.  Further disposition pending results of labs. Discussed med's effects and SE's.    Over 20 minutes of exam, counseling, chart review, and critical decision making was performed.   Future Appointments  Date Time Provider [place]  04/08/2022 10:00 AM Unk Pinto, MD [facility] None  04/09/2022  3:00 PM [facility] PFT ROOM [facility] None  04/09/2022  4:00 PM Cobb, Karie Schwalbe, NP [facility] None  05/06/2022  2:00 PM Carleene Mains, RPH [facility] None  07/16/2022 11:00 AM Alycia Rossetti, NP [facility] None    ------------------------------------------------------------------------------------------------------------------   HPI BP 130/70   Pulse 92   Temp 97.9 F (36.6 C)    Ht 5' 3"  (1.6 m)   Wt 135 lb (61.2 kg)   SpO2 96%   BMI 23.91 kg/m   71 y.o.female presents for evaluation of generalized pain associated with arthritis.  States that Tramadol is the only medication that is able to control pain.  She continues to take Tylenol, use Voltaren gel cream, Gabapentin.  Saw [facility] 02/10/22:  She had x-rays RF and CCP completed for managment- Saw Deveshwar in the past.  Recommended pain management for fibro and osteoarthritis.   She has a hx of IDA.  She tries to eat a well balanced diet and incorporate iron rich foods.   Past Medical History:  Diagnosis Date   Arthritis    ASCVD (arteriosclerotic cardiovascular disease)    Closed nondisplaced subtrochanteric fracture of left femur ([place]) 09/03/2017   September 2018.  Status post surgery by Dr. Marcelino Scot   Depression    Dyspnea    Fibromyalgia    Gait disorder 10/26/2012   GERD (gastroesophageal reflux disease)    GI bleed    Hyperlipidemia    Hypertension    Hypothyroid    Periprosthetic supracondylar fracture of femur, left  03/23/2017   Pneumonia    Pre-diabetes    Restless leg syndrome    Rhinitis 06/25/2010   Sleep apnea    does not use CPAP   Spinal headache    with the C section   TIA (transient ischemic attack)    3         Vitamin D deficiency      Allergies  Allergen Reactions  Codeine Other (See Comments)    Headache   Statins Other (See Comments)    Extreme pain   Oxycodone     Dizziness, unbalanced.   Amitriptyline Other (See Comments)    Confusion and hallucinations   Erythromycin Base Rash    Flushing/red face.   Fish Oil Nausea Only   Flax Seed [Flax Seed Oil] Rash   Flaxseed (Linseed) Rash   Hydrocodone Itching and Rash        Morphine Nausea And Vomiting and Rash   Nsaids Nausea Only   Nuvigil [Armodafinil] Other (See Comments)    Unspecified   Sertraline Rash   Sinemet [Carbidopa W-Levodopa] Rash   Sulfa Antibiotics Rash   Tolmetin Nausea Only     Current Outpatient Medications on File Prior to Visit  Medication Sig   acetaminophen (TYLENOL) 500 MG tablet Take 500 mg by mouth every 6 (six) hours as needed.   albuterol (VENTOLIN HFA) 108 (90 Base) MCG/ACT inhaler Inhale 2 puffs into the lungs every 6 (six) hours as needed for wheezing or shortness of breath.   benzonatate (TESSALON) 200 MG capsule TAKE 1 CAP THREE TIMES DAILY AS NEEDED FOR COUGH.   buPROPion (WELLBUTRIN XL) 300 MG 24 hr tablet Take 150 mg by mouth daily.   carboxymethylcellulose (REFRESH PLUS) 0.5 % SOLN Place 1 drop into both eyes 3 (three) times daily as needed (dry eyes).   clobetasol ointment (TEMOVATE) 0.05 % SMARTSIG:2 Topical Twice Daily   clotrimazole (MYCELEX) 10 MG troche Take 10 mg by mouth 3 (three) times daily.   diclofenac Sodium (VOLTAREN) 1 % GEL Apply topically 4 (four) times daily.   docusate sodium (COLACE) 100 MG capsule Take 100 mg by mouth 2 (two) times daily.   DULoxetine (CYMBALTA) 60 MG capsule TAKE 1 CAPSULE DAILY FOR CHRONIC PAIN   fenofibrate micronized (LOFIBRA) 134 MG capsule Take 1 capsule (134 mg total) by mouth daily before breakfast.   Ferrous Sulfate (IRON PO) Take 1 tablet by mouth daily.   fluticasone (FLONASE) 50 MCG/ACT nasal spray USE TWO SPRAYS IN EACH NOSTRIL DAILY AS NEEDED (Patient taking differently: 2 sprays at bedtime.)   gabapentin (NEURONTIN) 300 MG capsule TAKE 1 CAPSULE BY MOUTH THREE TIMES A DAY   hydroxychloroquine (PLAQUENIL) 200 MG tablet Take by mouth 2 (two) times daily.   levothyroxine (SYNTHROID) 88 MCG tablet Take  1 tablet  Daily  on an empty stomach with only water for 30 minutes & no Antacid meds, Calcium or Magnesium for 4 hours & avoid Biotin   meclizine (ANTIVERT) 25 MG tablet TAKE 1 TABLET (25 MG TOTAL) BY MOUTH 3 (THREE) TIMES DAILY AS NEEDED FOR DIZZINESS.   mupirocin ointment (BACTROBAN) 2 % 1 Application 3 (three) times daily.   omeprazole (PRILOSEC) 40 MG capsule Take 40 mg by mouth daily.    polyethylene glycol (MIRALAX / GLYCOLAX) 17 g packet Take 17-34 g by mouth daily. Dose depends on degree of constipation   rOPINIRole (REQUIP) 2 MG tablet TAKE 1 TABLET FOUR TIMES A DAY FOR RESTLESS LEGS (Patient taking differently: Take 1 mg by mouth See admin instructions. Take 1 tablet in AM, 1 tablet at dinner time, and 2 tablets at bedtime.)   traMADol (ULTRAM) 50 MG tablet Take by mouth.   No current facility-administered medications on file prior to visit.    ROS: all negative except what is noted in the HPI.   Physical Exam:  BP 130/70   Pulse 92   Temp 97.9 F (36.6  C)   Ht 5' 3"  (1.6 m)   Wt 135 lb (61.2 kg)   SpO2 96%   BMI 23.91 kg/m   General Appearance: NAD.  Awake, conversant and cooperative. Eyes: PERRLA, EOMs intact.  Sclera white.  Conjunctiva without erythema. Sinuses: No frontal/maxillary tenderness.  No nasal discharge. Nares patent.  ENT/Mouth: Ext aud canals clear.  Bilateral TMs w/DOL and without erythema or bulging. Hearing intact.  Posterior pharynx without swelling or exudate.  Tonsils without swelling or erythema.  Neck: Supple.  No masses, nodules or thyromegaly. Respiratory: Effort is regular with non-labored breathing. Breath sounds are equal bilaterally without rales, rhonchi, wheezing or stridor.  Cardio: RRR with no MRGs. Brisk peripheral pulses without edema.  Abdomen: Active BS in all four quadrants.  Soft and non-tender without guarding, rebound tenderness, hernias or masses. Lymphatics: Non tender without lymphadenopathy.  Musculoskeletal: Full ROM, 5/5 strength, normal ambulation.  No clubbing or cyanosis. Skin: Appropriate color for ethnicity. Warm without rashes, lesions, ecchymosis, ulcers.  Neuro: CN II-XII grossly normal. Normal muscle tone without cerebellar symptoms and intact sensation.   Psych: AO X 3,  appropriate mood and affect, insight and judgment.     Darrol Jump, NP 4:21 PM [facility]

## 2022-03-05 NOTE — Telephone Encounter (Signed)
LM-03/05/22-Pt. Returned call and informed me she had to restart her Oxybutynin and is taking it once daily because she was having facial redness and her whole body was sweating. Pt. Stated symptoms (redness and sweating) resolved since restarting Oxybutynin. Pt. Was unable to tell me how long she stopped taking the Oxybutynin or what day she restarted it. Pt. Stated she still have trouble with urinating too much, but stated she thinks it is because she has  chron disease and is constipated. Pt. Stated "I have a small bladder."  Asked pt. How frequently she has to urinate, pt. Stated "several times during the day and night." Informed pt. I will fwd to CP for review and will contact pt. If needed. Pt. Verbalized understanding and agreed.   Pt. Stated "I am going to my PCP today because I have had terrible leg pains and quivering Pt. Stated she didn't sleep last Saturday and Sunday d/t the sharp pain in her legs." Pt. Stated its been going on for a year and the pain has increased. Asked pt. What she has been using for pain and she stated "I use pain cream and my TENS unit, but it does not help at all."    Pt. Then Stated "The other thing is, I am running out of Tramadol."  Asked pt. Who prescribed her Tramadol, pt. Stated."Hold on a second I can get it, I've had several different doctors prescribe it, but I am not certain why." "I am not going to [facility] anymore for the neurological department." "They decided it has nothing to do with my brain- and laughed." Pt. Informed me her current bottle of Tramadol was filled by Simeon Craft, PA at [place].  Pt. Stated "She may not give it to me unless I see her and I have been out of town a couple of times, so I am going to try, but I don't know if Dr. Melford Aase will prescribe Tramadol." Pt asked me to refill her Tramadol. Told pt. I cannot refill her medication, and informed her with Tramadol being a narcotic, from my knowledge, she would most  likely need to see the provider in person for a visit for a refill d/t restrictions on narcotics. Pt. Asked and I assisted her by transferring her to [place] to request refill on her Tramadol.  Total time spent: 48 min.

## 2022-03-06 DIAGNOSIS — R531 Weakness: Secondary | ICD-10-CM | POA: Diagnosis not present

## 2022-03-06 DIAGNOSIS — M48062 Spinal stenosis, lumbar region with neurogenic claudication: Secondary | ICD-10-CM | POA: Diagnosis not present

## 2022-03-06 DIAGNOSIS — M79604 Pain in right leg: Secondary | ICD-10-CM | POA: Diagnosis not present

## 2022-03-06 DIAGNOSIS — R260 Ataxic gait: Secondary | ICD-10-CM | POA: Diagnosis not present

## 2022-03-06 LAB — CBC WITH DIFFERENTIAL/PLATELET
Absolute Monocytes: 587 cells/uL (ref 200–950)
Basophils Absolute: 57 cells/uL (ref 0–200)
Basophils Relative: 1 %
Eosinophils Absolute: 91 cells/uL (ref 15–500)
Eosinophils Relative: 1.6 %
HCT: 39.9 % (ref 35.0–45.0)
Hemoglobin: 13.4 g/dL (ref 11.7–15.5)
Lymphs Abs: 1322 cells/uL (ref 850–3900)
MCH: 30 pg (ref 27.0–33.0)
MCHC: 33.6 g/dL (ref 32.0–36.0)
MCV: 89.3 fL (ref 80.0–100.0)
MPV: 9.6 fL (ref 7.5–12.5)
Monocytes Relative: 10.3 %
Neutro Abs: 3642 cells/uL (ref 1500–7800)
Neutrophils Relative %: 63.9 %
Platelets: 264 10*3/uL (ref 140–400)
RBC: 4.47 10*6/uL (ref 3.80–5.10)
RDW: 12.4 % (ref 11.0–15.0)
Total Lymphocyte: 23.2 %
WBC: 5.7 10*3/uL (ref 3.8–10.8)

## 2022-03-06 LAB — IRON,TIBC AND FERRITIN PANEL
%SAT: 11 % (calc) — ABNORMAL LOW (ref 16–45)
Ferritin: 24 ng/mL (ref 16–288)
Iron: 41 ug/dL — ABNORMAL LOW (ref 45–160)
TIBC: 380 mcg/dL (calc) (ref 250–450)

## 2022-03-10 ENCOUNTER — Telehealth: Payer: Self-pay | Admitting: Nurse Practitioner

## 2022-03-10 DIAGNOSIS — M5416 Radiculopathy, lumbar region: Secondary | ICD-10-CM | POA: Diagnosis not present

## 2022-03-10 DIAGNOSIS — R2689 Other abnormalities of gait and mobility: Secondary | ICD-10-CM | POA: Diagnosis not present

## 2022-03-10 DIAGNOSIS — M5137 Other intervertebral disc degeneration, lumbosacral region: Secondary | ICD-10-CM | POA: Diagnosis not present

## 2022-03-10 NOTE — Telephone Encounter (Signed)
Pt is requesting a refill on tramadol to go to [facility] [employer] on [address]

## 2022-03-11 NOTE — Telephone Encounter (Signed)
Patient aware. Has already gotten a prescription this time from her Neurologist.

## 2022-03-15 DIAGNOSIS — R002 Palpitations: Secondary | ICD-10-CM | POA: Diagnosis not present

## 2022-03-29 DIAGNOSIS — I10 Essential (primary) hypertension: Secondary | ICD-10-CM

## 2022-03-29 DIAGNOSIS — E1169 Type 2 diabetes mellitus with other specified complication: Secondary | ICD-10-CM

## 2022-03-29 DIAGNOSIS — E785 Hyperlipidemia, unspecified: Secondary | ICD-10-CM

## 2022-03-29 DIAGNOSIS — N182 Chronic kidney disease, stage 2 (mild): Secondary | ICD-10-CM

## 2022-04-01 ENCOUNTER — Telehealth: Payer: Self-pay | Admitting: Internal Medicine

## 2022-04-01 ENCOUNTER — Telehealth: Payer: Self-pay | Admitting: Nurse Practitioner

## 2022-04-01 DIAGNOSIS — M25522 Pain in left elbow: Secondary | ICD-10-CM | POA: Diagnosis not present

## 2022-04-01 DIAGNOSIS — M25422 Effusion, left elbow: Secondary | ICD-10-CM | POA: Diagnosis not present

## 2022-04-01 NOTE — Telephone Encounter (Signed)
OV notes and clearance form have been faxed back to [place] office with Attn to [facility]. Nothing further needed at this time.

## 2022-04-01 NOTE — Telephone Encounter (Signed)
   Primary Cardiologist: Janina Mayo, MD  Chart reviewed as part of pre-operative protocol coverage. Given past medical history and time since last visit, based on ACC/AHA guidelines, Freyja Govea would be at acceptable risk for the planned procedure without further cardiovascular testing.   Patient is not on aspirin, clopidogrel, Eliquis, Xarelto or any other antiplatelet or anticoagulants.   I will route this recommendation to the requesting party via Epic fax function and remove from pre-op pool.  Please call with questions.  Emmaline Life, NP-C  04/01/2022, 4:02 PM [address], [address] Office [phone] Fax [phone]

## 2022-04-01 NOTE — Telephone Encounter (Signed)
   Pre-operative Risk Assessment    Patient Name: Crystal [name]  DOB: 1950-11-30 MRN: [id number]      Request for Surgical Clearance    Procedure:   inspire implant   Date of Surgery:  Clearance 04/08/22                                 Surgeon:  Dr. Malachy Chamber  Surgeon's Group or Practice Name:  [facility] Phone number:  [phone] Fax number:  [phone]   Type of Clearance Requested:   - Medical  - Pharmacy:  Hold Aspirin, Clopidogrel (Plavix), Apixaban (Eliquis), and Rivaroxaban (Xarelto) T   Type of Anesthesia:  General    Additional requests/questions:      Eston Mould   04/01/2022, 2:42 PM

## 2022-04-01 NOTE — Telephone Encounter (Signed)
She was improving when I saw her. She is clear from a pulmonary standpoint as long as her breathing is stable. I will addend my note. Thanks

## 2022-04-01 NOTE — Telephone Encounter (Signed)
Can you look at this patient chart to see about surgical clearance for [facility] Device. Surgery is set for 04/08/2022.  Please advise Joellen Jersey  Thank you

## 2022-04-01 NOTE — Progress Notes (Deleted)
Assessment and Plan:  There are no diagnoses linked to this encounter.    Further disposition pending results of labs. Discussed med's effects and SE's.   Over 30 minutes of exam, counseling, chart review, and critical decision making was performed.   Future Appointments  Date Time Provider [place]  04/02/2022  2:30 PM Alycia Rossetti, NP [facility] None  04/08/2022 10:00 AM Unk Pinto, MD [facility] None  04/09/2022  3:00 PM [facility] PFT ROOM [facility] None  04/09/2022  4:00 PM Cobb, Karie Schwalbe, NP [facility] None  05/06/2022  2:00 PM Carleene Mains, RPH [facility] None  07/16/2022 11:00 AM Alycia Rossetti, NP [facility] None    ------------------------------------------------------------------------------------------------------------------   HPI There were no vitals taken for this visit. 71 y.o.female presents for  Past Medical History:  Diagnosis Date   Arthritis    ASCVD (arteriosclerotic cardiovascular disease)    Closed nondisplaced subtrochanteric fracture of left femur ([place]) 09/03/2017   September 2018.  Status post surgery by Dr. Marcelino Scot   Depression    Dyspnea    Fibromyalgia    Gait disorder 10/26/2012   GERD (gastroesophageal reflux disease)    GI bleed    Hyperlipidemia    Hypertension    Hypothyroid    Periprosthetic supracondylar fracture of femur, left  03/23/2017   Pneumonia    Pre-diabetes    Restless leg syndrome    Rhinitis 06/25/2010   Sleep apnea    does not use CPAP   Spinal headache    with the C section   TIA (transient ischemic attack)    3         Vitamin D deficiency      Allergies  Allergen Reactions   Methocarbamol Nausea Only, Other (See Comments) and Shortness Of Breath    Other reaction(s): Dizziness, psychological reaction Dizzy, disoriented Sleepy and weight loss    Codeine Other (See Comments)    Headache   Statins Other (See Comments)    Extreme pain   Oxycodone     Dizziness,  unbalanced.   Amitriptyline Other (See Comments)    Confusion and hallucinations   Erythromycin Base Rash    Flushing/red face.   Fish Oil Nausea Only   Flax Seed [Flax Seed Oil] Rash   Flaxseed (Linseed) Rash   Hydrocodone Itching and Rash        Morphine Nausea And Vomiting and Rash   Nsaids Nausea Only   Nuvigil [Armodafinil] Other (See Comments)    Unspecified   Sertraline Rash   Sinemet [Carbidopa W-Levodopa] Rash   Sulfa Antibiotics Rash   Tolmetin Nausea Only    Current Outpatient Medications on File Prior to Visit  Medication Sig   acetaminophen (TYLENOL) 500 MG tablet Take 1,000 mg by mouth every 6 (six) hours as needed for moderate pain, mild pain or headache.   albuterol (VENTOLIN HFA) 108 (90 Base) MCG/ACT inhaler Inhale 2 puffs into the lungs every 6 (six) hours as needed for wheezing or shortness of breath.   benzonatate (TESSALON) 200 MG capsule TAKE 1 CAP THREE TIMES DAILY AS NEEDED FOR COUGH.   buPROPion (WELLBUTRIN XL) 150 MG 24 hr tablet Take 150 mg by mouth daily.   carboxymethylcellulose (REFRESH PLUS) 0.5 % SOLN Place 1 drop into both eyes 3 (three) times daily as needed (dry eyes).   clobetasol ointment (TEMOVATE) 1.57 % Apply 1 Application topically 3 (three) times daily.   clotrimazole (MYCELEX) 10 MG troche Take 10 mg by mouth 3 (three) times  daily.   dexamethasone (DECADRON) 0.5 MG/5ML solution Take 0.5 mg by mouth 3 (three) times daily.   diclofenac Sodium (VOLTAREN) 1 % GEL Apply 2 g topically daily as needed (pain).   docusate sodium (COLACE) 100 MG capsule Take 100 mg by mouth daily as needed for mild constipation or moderate constipation.   DULoxetine (CYMBALTA) 60 MG capsule TAKE 1 CAPSULE DAILY FOR CHRONIC PAIN   fenofibrate micronized (LOFIBRA) 134 MG capsule Take 1 capsule (134 mg total) by mouth daily before breakfast.   Ferrous Sulfate (IRON PO) Take 25 mg by mouth daily. gentle iron   fluticasone (FLONASE) 50 MCG/ACT nasal spray USE TWO  SPRAYS IN EACH NOSTRIL DAILY AS NEEDED (Patient taking differently: Place 2 sprays into both nostrils at bedtime.)   gabapentin (NEURONTIN) 300 MG capsule TAKE 1 CAPSULE BY MOUTH THREE TIMES A DAY (Patient taking differently: Take 400 mg by mouth 3 (three) times daily.)   guaiFENesin (MUCUS RELIEF ADULT PO) Take 1 capsule by mouth daily.   hydroxychloroquine (PLAQUENIL) 200 MG tablet Take 200 mg by mouth 2 (two) times daily.   levothyroxine (SYNTHROID) 88 MCG tablet Take  1 tablet  Daily  on an empty stomach with only water for 30 minutes & no Antacid meds, Calcium or Magnesium for 4 hours & avoid Biotin   meclizine (ANTIVERT) 25 MG tablet TAKE 1 TABLET (25 MG TOTAL) BY MOUTH 3 (THREE) TIMES DAILY AS NEEDED FOR DIZZINESS.   mupirocin ointment (BACTROBAN) 2 % Apply 1 Application topically 3 (three) times daily.   neomycin-polymyxin-hydrocortisone (CORTISPORIN) OTIC solution Place 3 drops into both ears daily as needed (ear inflammation).   omeprazole (PRILOSEC) 40 MG capsule Take 40 mg by mouth daily.   OVER THE COUNTER MEDICATION Place 1 mL under the tongue daily. D3 5000 units K2 120 mcg   oxybutynin (DITROPAN) 5 MG tablet Take 5 mg by mouth daily.   polyethylene glycol (MIRALAX / GLYCOLAX) 17 g packet Take 17-34 g by mouth at bedtime. Dose depends on degree of constipation   rOPINIRole (REQUIP) 2 MG tablet TAKE 1 TABLET FOUR TIMES A DAY FOR RESTLESS LEGS (Patient taking differently: Take 2-4 mg by mouth See admin instructions. Take 2 mg AM, 2 mg at dinner time, and 4 mg  at bedtime.)   tiZANidine (ZANAFLEX) 2 MG tablet Take 2 mg by mouth at bedtime.   traMADol (ULTRAM) 50 MG tablet Take 50 mg by mouth every 6 (six) hours as needed for moderate pain or severe pain.   No current facility-administered medications on file prior to visit.    ROS: all negative except above.   Physical Exam:  There were no vitals taken for this visit.  General Appearance: Well nourished, in no apparent  distress. Eyes: PERRLA, EOMs, conjunctiva no swelling or erythema Sinuses: No Frontal/maxillary tenderness ENT/Mouth: Ext aud canals clear, TMs without erythema, bulging. No erythema, swelling, or exudate on post pharynx.  Tonsils not swollen or erythematous. Hearing normal.  Neck: Supple, thyroid normal.  Respiratory: Respiratory effort normal, BS equal bilaterally without rales, rhonchi, wheezing or stridor.  Cardio: RRR with no MRGs. Brisk peripheral pulses without edema.  Abdomen: Soft, + BS.  Non tender, no guarding, rebound, hernias, masses. Lymphatics: Non tender without lymphadenopathy.  Musculoskeletal: Full ROM, 5/5 strength, normal gait.  Skin: Warm, dry without rashes, lesions, ecchymosis.  Neuro: Cranial nerves intact. Normal muscle tone, no cerebellar symptoms. Sensation intact.  Psych: Awake and oriented X 3, normal affect, Insight and Judgment appropriate.  Alycia Rossetti, NP 1:31 PM [facility]

## 2022-04-01 NOTE — Progress Notes (Signed)
04/01/2022: Addendum for Pre-Operative Clearance prior to hypoglossal nerve stimulator. Will need clearance from cardiology as well. Moderate risk.   Factors that increase the risk for postoperative pulmonary complications are severe untreated OSA, bronchiectasis, age.  Respiratory complications generally occur in 1% of ASA Class I patients, 5% of ASA Class II and 10% of ASA Class III-IV patients These complications rarely result in mortality and include postoperative pneumonia, atelectasis, pulmonary embolism, ARDS and increased time requiring postoperative mechanical ventilation.   Overall, I recommend proceeding with the surgery if the risk for respiratory complications are outweighed by the potential benefits. This will need to be discussed between the patient and surgeon.   To reduce risks of respiratory complications, I recommend: --Pre- and post-operative incentive spirometry performed frequently while awake --Inpatient use of positive-pressure for OSA whenever the patient is sleeping --Avoiding use of pancuronium during anesthesia. --OOB, encourage mobility post-op, DVT prophylaxis if indicated   1) RISK FOR PROLONGED MECHANICAL VENTILAION - > 48h  1A) Arozullah - Prolonged mech ventilation risk Arozullah Postperative Pulmonary Risk Score - for mech ventilation dependence >48h [employer], Ann Surg 2000, major non-cardiac surgery) Comment Score  Type of surgery - abd ao aneurysm (27), thoracic (21), neurosurgery / upper abdominal / vascular (21), neck (11) Hypoglossal nerve stimulator  11  Emergency Surgery - (11)    ALbumin < 3 or poor nutritional state - (9)    BUN > 30 -  (8)    Partial or completely dependent functional status - (7)    COPD -  (6)    Age - 60 to 69 (4), > 70  (6) 71 6  TOTAL  17  Risk Stratifcation scores  - < 10 (0.5%), 11-19 (1.8%), 20-27 (4.2%), 28-40 (10.1%), >40 (26.6%)  1.8%

## 2022-04-02 ENCOUNTER — Other Ambulatory Visit: Payer: Self-pay | Admitting: Otolaryngology

## 2022-04-02 ENCOUNTER — Ambulatory Visit: Payer: Medicare PPO | Admitting: Nurse Practitioner

## 2022-04-02 DIAGNOSIS — Z789 Other specified health status: Secondary | ICD-10-CM | POA: Diagnosis not present

## 2022-04-02 DIAGNOSIS — G4733 Obstructive sleep apnea (adult) (pediatric): Secondary | ICD-10-CM | POA: Diagnosis not present

## 2022-04-03 DIAGNOSIS — R531 Weakness: Secondary | ICD-10-CM | POA: Diagnosis not present

## 2022-04-03 DIAGNOSIS — R260 Ataxic gait: Secondary | ICD-10-CM | POA: Diagnosis not present

## 2022-04-03 DIAGNOSIS — M48062 Spinal stenosis, lumbar region with neurogenic claudication: Secondary | ICD-10-CM | POA: Diagnosis not present

## 2022-04-03 DIAGNOSIS — M79604 Pain in right leg: Secondary | ICD-10-CM | POA: Diagnosis not present

## 2022-04-04 ENCOUNTER — Encounter (HOSPITAL_COMMUNITY): Payer: Self-pay | Admitting: Otolaryngology

## 2022-04-04 ENCOUNTER — Other Ambulatory Visit: Payer: Self-pay

## 2022-04-04 NOTE — Progress Notes (Signed)
SDW CALL  Patient was given pre-op instructions over the phone. Patient verbalized understanding of instructions provided.   PCP - Dr. Unk Pinto Cardiologist - Dr. Phineas Inches, clearance 04/01/22 Pulmonary- [name] NP, clearance 04/01/22   Chest x-ray - 01/10/22 EKG - 02/24/22 Stress Test - denies ECHO - 02/28/22 Cardiac Cath - denies   Sleep Study - last home sleep study 11/06/21   Fasting Blood Sugar - n/a Checks Blood Sugar _____ times a day  Blood Thinner Instructions: n/a Aspirin Instructions:  ERAS Protcol - clears until 6am PRE-SURGERY Ensure or G2-   COVID TEST- n/a   Anesthesia review: Yes, hx spinal h/a, cardiac and pulm clearance  Patient denies shortness of breath, fever, cough and chest pain over the phone call

## 2022-04-08 ENCOUNTER — Ambulatory Visit: Admit: 2022-04-08 | Payer: Medicare PPO | Admitting: Otolaryngology

## 2022-04-08 ENCOUNTER — Ambulatory Visit: Payer: Medicare PPO | Admitting: Nurse Practitioner

## 2022-04-08 ENCOUNTER — Encounter: Payer: Medicare PPO | Admitting: Internal Medicine

## 2022-04-08 SURGERY — INSERTION, HYPOGLOSSAL NERVE STIMULATOR
Anesthesia: General | Laterality: Right

## 2022-04-09 ENCOUNTER — Ambulatory Visit: Payer: Medicare PPO | Admitting: Nurse Practitioner

## 2022-04-14 ENCOUNTER — Ambulatory Visit: Payer: Medicare PPO | Admitting: Nurse Practitioner

## 2022-04-16 ENCOUNTER — Telehealth: Payer: Self-pay

## 2022-04-16 NOTE — Telephone Encounter (Signed)
LM-04/16/22-Chart Prep started, Reviewing OV, Consults, Hospital visits, Labs and medication changes.    Total time spent: 46 min.

## 2022-04-17 DIAGNOSIS — Z961 Presence of intraocular lens: Secondary | ICD-10-CM | POA: Diagnosis not present

## 2022-04-17 DIAGNOSIS — H26493 Other secondary cataract, bilateral: Secondary | ICD-10-CM | POA: Diagnosis not present

## 2022-04-21 DIAGNOSIS — M5137 Other intervertebral disc degeneration, lumbosacral region: Secondary | ICD-10-CM | POA: Diagnosis not present

## 2022-04-21 DIAGNOSIS — M5416 Radiculopathy, lumbar region: Secondary | ICD-10-CM | POA: Diagnosis not present

## 2022-04-24 ENCOUNTER — Ambulatory Visit: Payer: Medicare PPO | Admitting: Internal Medicine

## 2022-04-24 ENCOUNTER — Encounter: Payer: Self-pay | Admitting: Internal Medicine

## 2022-04-24 VITALS — BP 130/88 | HR 102 | Temp 97.9°F | Resp 16 | Ht 63.0 in | Wt 134.2 lb

## 2022-04-24 DIAGNOSIS — R531 Weakness: Secondary | ICD-10-CM | POA: Diagnosis not present

## 2022-04-24 DIAGNOSIS — M79604 Pain in right leg: Secondary | ICD-10-CM | POA: Diagnosis not present

## 2022-04-24 DIAGNOSIS — M159 Polyosteoarthritis, unspecified: Secondary | ICD-10-CM | POA: Diagnosis not present

## 2022-04-24 DIAGNOSIS — R260 Ataxic gait: Secondary | ICD-10-CM | POA: Diagnosis not present

## 2022-04-24 DIAGNOSIS — M48062 Spinal stenosis, lumbar region with neurogenic claudication: Secondary | ICD-10-CM | POA: Diagnosis not present

## 2022-04-24 NOTE — Progress Notes (Signed)
Future Appointments  Date Time Provider Department  04/24/2022  4:00 PM Unk Pinto, MD [facility]  05/06/2022  2:00 PM Carleene Mains, [facility]  05/12/2022  2:00 PM Unk Pinto, MD [facility]  05/16/2022  3:00 PM [facility] PFT ROOM [facility]  05/16/2022  4:00 PM Clayton Bibles, NP [facility]  07/16/2022 11:00 AM Alycia Rossetti, NP [facility]    History of Present Illness:      This [name]  with HTN, HLD, diet controlled T2_DM, Hypothyroidism, OSA/BiPAP (hx/o mask intolerance) , Crohn's Colitis  and Vitamin D Deficiency -  presents with c/o pain in her neck, hands, knees & feet.  Patient saw Dr Cy Blamer in Mar 2019 for OA.  Patient has hx/o Lumbar Spinal Stenosis.  In May 2023, Dr Idolina Primer surgeon recommended that she see her rheumatologist for c/o hand /finger deformities.     Medications  Current Outpatient Medications (Endocrine & Metabolic):    dexamethasone (DECADRON) 0.5 MG/5ML solution, Take 0.5 mg by mouth 3 (three) times daily.   levothyroxine (SYNTHROID) 88 MCG tablet, Take  1 tablet  Daily  on an empty stomach with only water for 30 minutes & no Antacid meds, Calcium or Magnesium for 4 hours & avoid Biotin  Current Outpatient Medications (Cardiovascular):    fenofibrate micronized (LOFIBRA) 134 MG capsule, Take 1 capsule (134 mg total) by mouth daily before breakfast.  Current Outpatient Medications (Respiratory):    albuterol (VENTOLIN HFA) 108 (90 Base) MCG/ACT inhaler, Inhale 2 puffs into the lungs every 6 (six) hours as needed for wheezing or shortness of breath.   benzonatate (TESSALON) 200 MG capsule, TAKE 1 CAP THREE TIMES DAILY AS NEEDED FOR COUGH.   fluticasone (FLONASE) 50 MCG/ACT nasal spray, USE TWO SPRAYS IN EACH NOSTRIL DAILY AS NEEDED (Patient taking differently: Place 2 sprays into both nostrils at bedtime.)   guaiFENesin (MUCUS RELIEF ADULT PO), Take 1 capsule by mouth daily.  Current Outpatient  Medications (Analgesics):    acetaminophen (TYLENOL) 500 MG tablet, Take 1,000 mg by mouth every 6 (six) hours as needed for moderate pain, mild pain or headache.   traMADol (ULTRAM) 50 MG tablet, Take 50 mg by mouth every 6 (six) hours as needed for moderate pain or severe pain.  Current Outpatient Medications (Hematological):    Ferrous Sulfate (IRON PO), Take 25 mg by mouth daily. gentle iron  Current Outpatient Medications (Other):    buPROPion (WELLBUTRIN XL) 150 MG 24 hr tablet, Take 150 mg by mouth daily.   carboxymethylcellulose (REFRESH PLUS) 0.5 % SOLN, Place 1 drop into both eyes 3 (three) times daily as needed (dry eyes).   clobetasol ointment (TEMOVATE) 3.33 %, Apply 1 Application topically 3 (three) times daily.   clotrimazole (MYCELEX) 10 MG troche, Take 10 mg by mouth 3 (three) times daily.   diclofenac Sodium (VOLTAREN) 1 % GEL, Apply 2 g topically daily as needed (pain).   docusate sodium (COLACE) 100 MG capsule, Take 100 mg by mouth daily as needed for mild constipation or moderate constipation.   DULoxetine (CYMBALTA) 60 MG capsule, TAKE 1 CAPSULE DAILY FOR CHRONIC PAIN   gabapentin (NEURONTIN) 300 MG capsule, TAKE 1 CAPSULE BY MOUTH THREE TIMES A DAY (Patient taking differently: Take 400 mg by mouth 3 (three) times daily.)   hydroxychloroquine (PLAQUENIL) 200 MG tablet, Take 200 mg by mouth 2 (two) times daily.   meclizine (ANTIVERT) 25 MG tablet, TAKE 1 TABLET (25 MG TOTAL) BY MOUTH 3 (THREE)  TIMES DAILY AS NEEDED FOR DIZZINESS.   mupirocin ointment (BACTROBAN) 2 %, Apply 1 Application topically 3 (three) times daily.   neomycin-polymyxin-hydrocortisone (CORTISPORIN) OTIC solution, Place 3 drops into both ears daily as needed (ear inflammation).   omeprazole (PRILOSEC) 40 MG capsule, Take 40 mg by mouth daily.   OVER THE COUNTER MEDICATION, Place 1 mL under the tongue daily. D3 5000 units K2 120 mcg   oxybutynin (DITROPAN) 5 MG tablet, Take 5 mg by mouth daily.    polyethylene glycol (MIRALAX / GLYCOLAX) 17 g packet, Take 17-34 g by mouth at bedtime. Dose depends on degree of constipation   rOPINIRole (REQUIP) 2 MG tablet, TAKE 1 TABLET FOUR TIMES A DAY FOR RESTLESS LEGS (Patient taking differently: Take 2-4 mg by mouth See admin instructions. Take 2 mg AM, 2 mg at dinner time, and 4 mg  at bedtime.)   tiZANidine (ZANAFLEX) 2 MG tablet, Take 2 mg by mouth at bedtime.  Problem list She has Obstructive sleep apnea; RESTLESS LEG SYNDROME; Essential hypertension; Rhinitis; GERD; Crohn's Enterocolitis / Regional enteritis; Fibromyalgia; Gait disorder; Hyperlipidemia associated with type 2 diabetes mellitus ([place]); Hypothyroid; Depression, major, recurrent, in partial remission ([place]); Vitamin D deficiency; ASCVD; Medication management; Rotator cuff arthropathy; Obesity (BMI 30.0-34.9); Spinal stenosis in cervical region; IBS (irritable colon syndrome); Dyspnea on exertion; Sleep talking; B12 deficiency anemia; Polypharmacy; Bronchiectasis without complication ([place]); Arthritis of carpometacarpal (CMC) joints of both thumbs; Bilateral bunions; Cervical disc disease; Crohn's ileitis ([place]); Rotator cuff tear; Diverticulosis; History of adenomatous polyp of colon; Internal hemorrhoids; Knee pain; Other specified arthropathy involving shoulder region; S/P revision of total knee, left; Diet-controlled type 2 diabetes mellitus ([place]); Rupture of popliteal cyst of right knee region (complex cyst); History of recurrent TIAs; COVID-19 virus infection; CKD stage 2 due to type 2 diabetes mellitus ([place]); History of total knee arthroplasty, right; Pain due to total right knee replacement ([place]); Presence of artificial knee joint, right; Aortic atherosclerosis ([place]) by Chest CT scan on 09/2017; Lumbar spinal stenosis; and Dizziness on their problem list.   Observations/Objective:  BP (!) 130/98   Pulse (!) 102   Temp 97.9 F (36.6 C)   Resp 16   Ht 5' 3"  (1.6 m)   Wt 134 lb 3.2 oz (60.9  kg)   SpO2 98%   BMI 23.77 kg/m   HEENT - WNL. Neck - supple.  Chest - Clear equal BS. Cor - Nl HS. RRR w/o sig MGR. PP 2 (+). No edema. MS- FROM .  Gait Nl.  Bilateral Heberden's & Bouchard's nodes  of most finger joints  with moderate lateral splaying.  Neuro -  Nl w/o focal abnormalities. Skin - Dependent rubor of toes & distal feet.   Assessment and Plan:   1. Primary osteoarthritis involving multiple joints  - Recommended a trial with                                                    Tumeric /Bioperine 1,000-1,200 mg/5-10 mg bid &                                                   Cinnamon 1,000 mg bid   Follow Up  Instructions:        I discussed the assessment and treatment plan with the patient. The patient was provided an opportunity to ask questions and all were answered. The patient agreed with the plan and demonstrated an understanding of the instructions.       The patient was advised to call back or seek an in-person evaluation if the symptoms worsen or if the condition fails to improve as anticipated.   Kirtland Bouchard, MD

## 2022-04-26 ENCOUNTER — Encounter: Payer: Self-pay | Admitting: Internal Medicine

## 2022-04-26 ENCOUNTER — Other Ambulatory Visit: Payer: Self-pay | Admitting: Internal Medicine

## 2022-04-26 NOTE — Progress Notes (Incomplete)
Future Appointments  Date Time Provider Department  04/24/2022  4:00 PM Unk Pinto, MD [facility]  05/06/2022  2:00 PM Carleene Mains, [facility]  05/12/2022  2:00 PM Unk Pinto, MD [facility]  05/16/2022  3:00 PM [facility] PFT ROOM [facility]  05/16/2022  4:00 PM Clayton Bibles, NP [facility]  07/16/2022 11:00 AM Alycia Rossetti, NP [facility]    History of Present Illness:      This [name]  with HTN, HLD, diet controlled T2_DM, Hypothyroidism, OSA/BiPAP (hx/o mask intolerance) , Crohn's Colitis  and Vitamin D Deficiency -  presents with c/o pain in her neck, hands, knees & feet.  Patient saw Dr Cy Blamer in Mar 2019 for OA.  Patient has hx/o Lumbar Spinal Stenosis.  In May 2023, Dr Idolina Primer surgeon recommended that she see her rheumatologist for c/o hand /finger deformities.     Medications  Current Outpatient Medications (Endocrine & Metabolic):  .  dexamethasone (DECADRON) 0.5 MG/5ML solution, Take 0.5 mg by mouth 3 (three) times daily. [name]  levothyroxine (SYNTHROID) 88 MCG tablet, Take  1 tablet  Daily  on an empty stomach with only water for 30 minutes & no Antacid meds, Calcium or Magnesium for 4 hours & avoid Biotin  Current Outpatient Medications (Cardiovascular):  .  fenofibrate micronized (LOFIBRA) 134 MG capsule, Take 1 capsule (134 mg total) by mouth daily before breakfast.  Current Outpatient Medications (Respiratory):  .  albuterol (VENTOLIN HFA) 108 (90 Base) MCG/ACT inhaler, Inhale 2 puffs into the lungs every 6 (six) hours as needed for wheezing or shortness of breath. .  benzonatate (TESSALON) 200 MG capsule, TAKE 1 CAP THREE TIMES DAILY AS NEEDED FOR COUGH. .  fluticasone (FLONASE) 50 MCG/ACT nasal spray, USE TWO SPRAYS IN EACH NOSTRIL DAILY AS NEEDED (Patient taking differently: Place 2 sprays into both nostrils at bedtime.) .  guaiFENesin (MUCUS RELIEF ADULT PO), Take 1 capsule by mouth daily.  Current  Outpatient Medications (Analgesics):  .  acetaminophen (TYLENOL) 500 MG tablet, Take 1,000 mg by mouth every 6 (six) hours as needed for moderate pain, mild pain or headache. .  traMADol (ULTRAM) 50 MG tablet, Take 50 mg by mouth every 6 (six) hours as needed for moderate pain or severe pain.  Current Outpatient Medications (Hematological):  [name]  Ferrous Sulfate (IRON PO), Take 25 mg by mouth daily. gentle iron  Current Outpatient Medications (Other):  [name]  buPROPion (WELLBUTRIN XL) 150 MG 24 hr tablet, Take 150 mg by mouth daily. .  carboxymethylcellulose (REFRESH PLUS) 0.5 % SOLN, Place 1 drop into both eyes 3 (three) times daily as needed (dry eyes). .  clobetasol ointment (TEMOVATE) 2.22 %, Apply 1 Application topically 3 (three) times daily. .  clotrimazole (MYCELEX) 10 MG troche, Take 10 mg by mouth 3 (three) times daily. .  diclofenac Sodium (VOLTAREN) 1 % GEL, Apply 2 g topically daily as needed (pain). [name]  docusate sodium (COLACE) 100 MG capsule, Take 100 mg by mouth daily as needed for mild constipation or moderate constipation. .  DULoxetine (CYMBALTA) 60 MG capsule, TAKE 1 CAPSULE DAILY FOR CHRONIC PAIN .  gabapentin (NEURONTIN) 300 MG capsule, TAKE 1 CAPSULE BY MOUTH THREE TIMES A DAY (Patient taking differently: Take 400 mg by mouth 3 (three) times daily.) .  hydroxychloroquine (PLAQUENIL) 200 MG tablet, Take 200 mg by mouth 2 (two) times daily. .  meclizine (ANTIVERT) 25 MG tablet, TAKE 1 TABLET (25 MG TOTAL) BY MOUTH 3 (THREE)  TIMES DAILY AS NEEDED FOR DIZZINESS. .  mupirocin ointment (BACTROBAN) 2 %, Apply 1 Application topically 3 (three) times daily. [name]  neomycin-polymyxin-hydrocortisone (CORTISPORIN) OTIC solution, Place 3 drops into both ears daily as needed (ear inflammation). [name]  omeprazole (PRILOSEC) 40 MG capsule, Take 40 mg by mouth daily. [name]  OVER THE COUNTER MEDICATION, Place 1 mL under the tongue daily. D3 5000 units K2 120 mcg .  oxybutynin (DITROPAN) 5 MG tablet, Take 5  mg by mouth daily. .  polyethylene glycol (MIRALAX / GLYCOLAX) 17 g packet, Take 17-34 g by mouth at bedtime. Dose depends on degree of constipation .  rOPINIRole (REQUIP) 2 MG tablet, TAKE 1 TABLET FOUR TIMES A DAY FOR RESTLESS LEGS (Patient taking differently: Take 2-4 mg by mouth See admin instructions. Take 2 mg AM, 2 mg at dinner time, and 4 mg  at bedtime.) .  tiZANidine (ZANAFLEX) 2 MG tablet, Take 2 mg by mouth at bedtime.  Problem list She has Obstructive sleep apnea; RESTLESS LEG SYNDROME; Essential hypertension; Rhinitis; GERD; Crohn's Enterocolitis / Regional enteritis; Fibromyalgia; Gait disorder; Hyperlipidemia associated with type 2 diabetes mellitus ([place]); Hypothyroid; Depression, major, recurrent, in partial remission ([place]); Vitamin D deficiency; ASCVD; Medication management; Rotator cuff arthropathy; Obesity (BMI 30.0-34.9); Spinal stenosis in cervical region; IBS (irritable colon syndrome); Dyspnea on exertion; Sleep talking; B12 deficiency anemia; Polypharmacy; Bronchiectasis without complication ([place]); Arthritis of carpometacarpal (CMC) joints of both thumbs; Bilateral bunions; Cervical disc disease; Crohn's ileitis ([place]); Rotator cuff tear; Diverticulosis; History of adenomatous polyp of colon; Internal hemorrhoids; Knee pain; Other specified arthropathy involving shoulder region; S/P revision of total knee, left; Diet-controlled type 2 diabetes mellitus ([place]); Rupture of popliteal cyst of right knee region (complex cyst); History of recurrent TIAs; COVID-19 virus infection; CKD stage 2 due to type 2 diabetes mellitus ([place]); History of total knee arthroplasty, right; Pain due to total right knee replacement ([place]); Presence of artificial knee joint, right; Aortic atherosclerosis ([place]) by Chest CT scan on 09/2017; Lumbar spinal stenosis; and Dizziness on their problem list.   Observations/Objective:  BP (!) 130/98   Pulse (!) 102   Temp 97.9 F (36.6 C)   Resp 16   Ht 5' 3"  (1.6 m)    Wt 134 lb 3.2 oz (60.9 kg)   SpO2 98%   BMI 23.77 kg/m   HEENT - WNL. Neck - supple.  Chest - Clear equal BS. Cor - Nl HS. RRR w/o sig MGR. PP 2 (+). No edema. MS- FROM .  Gait Nl.  Bilateral Heberden's & Bouchard's nodes  of most finger joints  with moderate lateral splaying.  Neuro -  Nl w/o focal abnormalities. Skin - Dependent rubor of toes & distal feet.   Assessment and Plan:   1. Primary osteoarthritis involving multiple joints  - Recommended a trial with                                                    Tumeric /Bioperine 1,000-1,200 mg/5-10 mg bid &                                                   Cinnamon 1,000 mg bid   Follow Up  Instructions:        I discussed the assessment and treatment plan with the patient. The patient was provided an opportunity to ask questions and all were answered. The patient agreed with the plan and demonstrated an understanding of the instructions.       The patient was advised to call back or seek an in-person evaluation if the symptoms worsen or if the condition fails to improve as anticipated.   Kirtland Bouchard, MD

## 2022-04-27 ENCOUNTER — Encounter: Payer: Self-pay | Admitting: Internal Medicine

## 2022-04-27 NOTE — Patient Instructions (Signed)
Osteoarthritis Osteoarthritis is a type of arthritis. It refers to joint pain or joint disease. Osteoarthritis affects tissue that covers the ends of bones in joints (cartilage). Cartilage acts as a cushion between the bones and helps them move smoothly. Osteoarthritis occurs when cartilage in the joints gets worn down. Osteoarthritis is sometimes called "wear and tear" arthritis. Osteoarthritis is the most common form of arthritis. It often occurs in older people. It is a condition that gets worse over time. The joints most often affected by this condition are in the fingers, toes, hips, knees, and spine, including the neck and lower back. What are the causes? This condition is caused by the wearing down of cartilage that covers the ends of bones. What increases the risk? The following factors may make you more likely to develop this condition: Being age 25 or older. Obesity. Overuse of joints. Past injury of a joint. Past surgery on a joint. Family history of osteoarthritis. What are the signs or symptoms? The main symptoms of this condition are pain, swelling, and stiffness in the joint. Other symptoms may include: An enlarged joint. More pain and further damage caused by small pieces of bone or cartilage that break off and float inside of the joint. Small deposits of bone (osteophytes) that grow on the edges of the joint. A grating or scraping feeling inside the joint when you move it. Popping or creaking sounds when you move. Difficulty walking or exercising. An inability to grip items, twist your hand(s), or control the movements of your hands and fingers. How is this diagnosed? This condition may be diagnosed based on: Your medical history. A physical exam. Your symptoms. X-rays of the affected joint(s). Blood tests to rule out other types of arthritis. How is this treated? There is no cure for this condition, but treatment can help control pain and improve joint function.  Treatment may include a combination of therapies, such as: Pain relief techniques, such as: Applying heat and cold to the joint. Massage. A form of talk therapy called cognitive behavioral therapy (CBT). This therapy helps you set goals and follow up on the changes that you make. Medicines for pain and inflammation. The medicines can be taken by mouth or applied to the skin. They include: NSAIDs, such as ibuprofen. Prescription medicines. Strong anti-inflammatory medicines (corticosteroids). Certain nutritional supplements. A prescribed exercise program. You may work with a physical therapist. Assistive devices, such as a brace, wrap, splint, specialized glove, or cane. A weight control plan. Surgery, such as: An osteotomy. This is done to reposition the bones and relieve pain or to remove loose pieces of bone and cartilage. Joint replacement surgery. You may need this surgery if you have advanced osteoarthritis. Follow these instructions at home: Activity Rest your affected joints as told by your health care provider. Exercise as told by your health care provider. He or she may recommend specific types of exercise, such as: Strengthening exercises. These are done to strengthen the muscles that support joints affected by arthritis. Aerobic activities. These are exercises, such as brisk walking or water aerobics, that increase your heart rate. Range-of-motion activities. These help your joints move more easily. Balance and agility exercises. Managing pain, stiffness, and swelling If directed, apply heat to the affected area as often as told by your health care provider. Use the heat source that your health care provider recommends, such as a moist heat pack or a heating pad. If you have a removable assistive device, remove it as told by your health  care provider. Place a towel between your skin and the heat source. If your health care provider tells you to keep the assistive device on while  you apply heat, place a towel between the assistive device and the heat source. Leave the heat on for 20-30 minutes. Remove the heat if your skin turns bright red. This is especially important if you are unable to feel pain, heat, or cold. You may have a greater risk of getting burned. If directed, put ice on the affected area. To do this: If you have a removable assistive device, remove it as told by your health care provider. Put ice in a plastic bag. Place a towel between your skin and the bag. If your health care provider tells you to keep the assistive device on during icing, place a towel between the assistive device and the bag. Leave the ice on for 20 minutes, 2-3 times a day. Move your fingers or toes often to reduce stiffness and swelling. Raise (elevate) the injured area above the level of your heart while you are sitting or lying down. General instructions Take over-the-counter and prescription medicines only as told by your health care provider. Maintain a healthy weight. Follow instructions from your health care provider for weight control. Do not use any products that contain nicotine or tobacco, such as cigarettes, e-cigarettes, and chewing tobacco. If you need help quitting, ask your health care provider. Use assistive devices as told by your health care provider. Keep all follow-up visits as told by your health care provider. This is important. Where to find more information [employer] of Arthritis and Musculoskeletal and Skin Diseases: www.niams.SouthExposed.es [employer] on Aging: http://kim-miller.com/ American College of Rheumatology: www.rheumatology.org Contact a health care provider if: You have redness, swelling, or a feeling of warmth in a joint that gets worse. You have a fever along with joint or muscle aches. You develop a rash. You have trouble doing your normal activities. Get help right away if: You have pain that gets worse and is not relieved by pain  medicine. Summary Osteoarthritis is a type of arthritis that affects tissue covering the ends of bones in joints (cartilage). This condition is caused by the wearing down of cartilage that covers the ends of bones. The main symptom of this condition is pain, swelling, and stiffness in the joint. There is no cure for this condition, but treatment can help control pain and improve joint function.

## 2022-04-29 DIAGNOSIS — M79604 Pain in right leg: Secondary | ICD-10-CM | POA: Diagnosis not present

## 2022-04-29 DIAGNOSIS — R260 Ataxic gait: Secondary | ICD-10-CM | POA: Diagnosis not present

## 2022-04-29 DIAGNOSIS — M48062 Spinal stenosis, lumbar region with neurogenic claudication: Secondary | ICD-10-CM | POA: Diagnosis not present

## 2022-04-29 DIAGNOSIS — R531 Weakness: Secondary | ICD-10-CM | POA: Diagnosis not present

## 2022-04-29 NOTE — Telephone Encounter (Signed)
LM-04/29/22-Chart Prep started, Reviewing OV, Consults, Hospital visits, Labs and medication changes.  Chart prep complete.  Total time spent: 37 min.

## 2022-05-01 ENCOUNTER — Telehealth: Payer: Self-pay

## 2022-05-01 DIAGNOSIS — R260 Ataxic gait: Secondary | ICD-10-CM | POA: Diagnosis not present

## 2022-05-01 DIAGNOSIS — M48062 Spinal stenosis, lumbar region with neurogenic claudication: Secondary | ICD-10-CM | POA: Diagnosis not present

## 2022-05-01 DIAGNOSIS — M79604 Pain in right leg: Secondary | ICD-10-CM | POA: Diagnosis not present

## 2022-05-01 DIAGNOSIS — R531 Weakness: Secondary | ICD-10-CM | POA: Diagnosis not present

## 2022-05-01 NOTE — Telephone Encounter (Signed)
LM-05/01/22-Called pt. To confirm CP f/u visit scheduled for 11/7 at 2:00PM. Unable to reach pt. VM box not set up, will try calling at another time.  Total time spent: 2 min.

## 2022-05-02 ENCOUNTER — Other Ambulatory Visit: Payer: Self-pay | Admitting: Internal Medicine

## 2022-05-02 DIAGNOSIS — G2581 Restless legs syndrome: Secondary | ICD-10-CM

## 2022-05-02 NOTE — Telephone Encounter (Signed)
LM-05/02/22-Called pt. To confirm CP office f/u visit for 11/7 at 2:00PM. Unable to reach pt. No VM box set up. Will try calling at another time.  Total time spent: 2 min.

## 2022-05-05 NOTE — Telephone Encounter (Signed)
LM-05/05/22-Called pt. To confirm CP visit for 11/7 at 2:00PM. Unable to reach pt. No VM box set up.   Total time spent: 2 min.

## 2022-05-06 ENCOUNTER — Ambulatory Visit: Payer: Medicare PPO | Admitting: Pharmacy Technician

## 2022-05-06 ENCOUNTER — Ambulatory Visit: Payer: Medicare PPO | Admitting: Rheumatology

## 2022-05-06 DIAGNOSIS — M48062 Spinal stenosis, lumbar region with neurogenic claudication: Secondary | ICD-10-CM | POA: Diagnosis not present

## 2022-05-06 DIAGNOSIS — I10 Essential (primary) hypertension: Secondary | ICD-10-CM

## 2022-05-06 DIAGNOSIS — R260 Ataxic gait: Secondary | ICD-10-CM | POA: Diagnosis not present

## 2022-05-06 DIAGNOSIS — E1169 Type 2 diabetes mellitus with other specified complication: Secondary | ICD-10-CM

## 2022-05-06 DIAGNOSIS — R531 Weakness: Secondary | ICD-10-CM | POA: Diagnosis not present

## 2022-05-06 DIAGNOSIS — M79604 Pain in right leg: Secondary | ICD-10-CM | POA: Diagnosis not present

## 2022-05-06 DIAGNOSIS — Z79899 Other long term (current) drug therapy: Secondary | ICD-10-CM

## 2022-05-06 DIAGNOSIS — E1122 Type 2 diabetes mellitus with diabetic chronic kidney disease: Secondary | ICD-10-CM

## 2022-05-08 ENCOUNTER — Encounter: Payer: Self-pay | Admitting: Nurse Practitioner

## 2022-05-08 ENCOUNTER — Other Ambulatory Visit: Payer: Self-pay | Admitting: Internal Medicine

## 2022-05-08 DIAGNOSIS — L438 Other lichen planus: Secondary | ICD-10-CM | POA: Diagnosis not present

## 2022-05-08 DIAGNOSIS — Z79899 Other long term (current) drug therapy: Secondary | ICD-10-CM | POA: Diagnosis not present

## 2022-05-09 ENCOUNTER — Other Ambulatory Visit: Payer: Self-pay | Admitting: Nurse Practitioner

## 2022-05-09 DIAGNOSIS — M797 Fibromyalgia: Secondary | ICD-10-CM

## 2022-05-09 MED ORDER — GABAPENTIN 300 MG PO CAPS: 300.0000 mg | ORAL_CAPSULE | Freq: Three times a day (TID) | ORAL | 2 refills | Status: DC

## 2022-05-10 ENCOUNTER — Other Ambulatory Visit: Payer: Self-pay | Admitting: Nurse Practitioner

## 2022-05-11 NOTE — Progress Notes (Incomplete)
Annual Screening/Preventative Visit & Comprehensive Evaluation &  Examination   Future Appointments  Date Time Provider Department  05/12/2022                        cpe  2:00 PM Unk Pinto, MD [facility]  05/16/2022  4:00 PM Clayton Bibles, NP [facility]  07/16/2022                          wellness 11:00 AM Crystal Rossetti, NP [facility]  05/18/2023                         cpe  2:00 PM Unk Pinto, MD [facility]         This [name] with  HTN, HLD, diet controlled T2_DM, Hypothyroidism, OSA/BiPAP (hx/o mask intolerance) and Vitamin D Deficiency presents for a Screening /Preventative Visit & comprehensive evaluation and management of multiple medical co-morbidities.   Patient has Crohn's Colitis -  monitored & managed by Dr Earlean Shawl.  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        Patient is followed by Dr Baxter Kail, [facility] for oral Lichen Planus treating with Flagyl & Fluconazole.   Patient also  has a Chronic Pain Syndrome consequent of multiple Neck & back surgeries & is on Cymbalta/Gabapentin to avoid chronic Opioids. In June, 2022,  patient underwent Rt TKR (26th surgery) .         HTN predates circa 2006 . Patient's BP has been controlled at home and patient denies any cardiac symptoms as chest pain, palpitations, shortness of breath, dizziness or ankle swelling. Today's BP is at goal -                  .        Patient's hyperlipidemia is controlled with diet and medications. Patient denies myalgias or other medication SE's. Last lipids were at goal except elevated trig's:  Lab Results  Component Value Date   CHOL 165 10/17/2020   HDL 35 (L) 10/17/2020   LDLCALC 94 10/17/2020   TRIG 238 (H) 10/17/2020   CHOLHDL 4.7 10/17/2020         Patient has obesity (BMI 30+)  and consequent  diet controlled T2_DM/CKD2 (GFR 75)  since 2016 (A1c 6.9%) and patient denies reactive hypoglycemic symptoms, visual  blurring, diabetic polys or paresthesias. Last A1c was  not at goal:  Lab Results  Component Value Date   HGBA1C 6.0 (H) 10/17/2020         Finally, patient has history of Vitamin D Deficiency ("33" /2008) and last Vitamin D was at goal:  Lab Results  Component Value Date   VD25OH 60 03/20/2020     Current Outpatient Medications on File Prior to Visit  Medication Sig  . benzonatate (TESSALON) 200 MG capsule Take 1 cap three times daily as needed for cough.  . bisoprolol (ZEBETA) 5 MG tablet Take  1/2 to 1  tablet  every Morning  for BP  . buPROPion (WELLBUTRIN XL) 150 MG 24 hr tablet TAKE 1 TABLET DAILY FOR MOOD, FOCUS & CONCENTRATION  . calcium carbonate (OS-CAL) 1250 (500 Ca) MG chewable tablet Chew 1 tablet by mouth daily.  . carboxymethylcellulose (REFRESH PLUS) 0.5 % SOLN Place 1 drop into both eyes 3 (three) times daily as needed (dry eyes).  [name]  Cholecalciferol 125 MCG (5000 UT) TABS Take 5,000 Units by mouth daily.  . clotrimazole (MYCELEX) 10 MG troche Take 1 tablet by mouth daily.  . Cyanocobalamin (B-12 SL) Place 1 each under the tongue daily.   . diclofenac Sodium (VOLTAREN) 1 % GEL Apply 4 g topically 4 (four) times daily. (Patient taking differently: Apply 4 g topically 4 (four) times daily as needed (pain).)  . DULoxetine (CYMBALTA) 60 MG capsule Take 1 capsule Daily for Chronic Pain  . fenofibrate micronized (LOFIBRA) 134 MG capsule Take  1 capsule  Daily  for Triglycerides (Blood Fats)  . fluconazole (DIFLUCAN) 150 MG tablet Take 150 mg by mouth See admin instructions. Sundays and Thursday  . fluocinonide gel (LIDEX) 1.51 % Apply 1 application topically 2 (two) times daily as needed (infection).  . fluticasone (FLONASE) 50 MCG/ACT nasal spray USE TWO SPRAYS IN EACH NOSTRIL DAILY AS NEEDED (Patient taking differently: Place 2 sprays into both nostrils daily as needed for allergies.)  . gabapentin (NEURONTIN) 300 MG capsule TAKE 1 CAPSULE BY MOUTH THREE TIMES A DAY  .  gabapentin (NEURONTIN) 600 MG tablet Take 1/2 to 1 tablet 3 to 4 x /Daily as needed for Pain  . hydrocortisone 2.5 % ointment Apply 1 application topically daily as needed (itching).  . Lactobacillus () Take 1 tablet daily.  [name] levothyroxine  100 MCG tablet TAKE 1 TABLET  ON TWThSS & 11/2 1& 1/2 TABLET ON MON AND FRI  . meclizine 25 MG tablet TAKE 1 TABLET (25 MG TOTAL) BY MOUTH 3 (THREE) TIMES DAILY AS NEEDED FOR DIZZINESS.  [name] metroNIDAZOLE 500 MG tablet Take 500 mg by mouth in the morning and at bedtime.   . CORTISPORIN OTIC suspension Instill  6 to 8 drops  to affected ear  3 to 4 x /day  and occlude with cotton  . olmesartan (BENICAR) 20 MG tablet Take 1/2 to 1 tablet  at Night  for BP  . oxybutynin (DITROPAN) 5 MG tablet TAKE 1 TABLET TWICE A DAY FOR BLADDER CONTROL  . rOPINIRole (REQUIP) 2 MG tablet TAKE 1 TABLET FOUR TIMES A DAY FOR RESTLESS LEGS  . tiZANidine (ZANAFLEX) 2 MG tablet Take  1/2 to 1 tablet  every 8 hours  ONLY  if needed for Muscle Spasms  . traMADol (ULTRAM) 50 MG tablet Take 50 mg by mouth every 6 (six) hours as needed for moderate pain.     Allergies  Allergen Reactions  . Atorvastatin Other (See Comments)    Extreme pain  . Codeine Other (See Comments)    Headache  . Pravastatin Other (See Comments)    Exreme pain  . Statins Other (See Comments)    Extreme pain  . Amitriptyline Other (See Comments)    Unspecified  . Carbidopa   . Fish Oil Nausea Only  . Hydrocodone Other (See Comments)    Itching Other Reaction: Other reaction   . Linseed Oil Other (See Comments)    Other Reaction: Other reaction  . Nuvigil [Armodafinil] Other (See Comments)    Unspecified  . Sulfa Antibiotics Other (See Comments)  . Erythromycin Other (See Comments)    Reaction unspecified  . Erythromycin Base Rash  . Flax Seed [Bio-Flax] Rash    Linseed  . Hydrocodone-Acetaminophen Rash  . Morphine Nausea And Vomiting and Rash  . Nsaids Nausea Only and Other (See Comments)     Other Reaction: Other reaction  . Sertraline Rash  . Sinemet [Carbidopa W-Levodopa] Rash  . Sulfamethoxazole Rash  .  Tolmetin Nausea Only     Past Medical History:  Diagnosis Date  . Arthritis   . ASCVD (arteriosclerotic cardiovascular disease)   . Closed nondisplaced subtrochanteric fracture of left femur ([place]) 09/03/2017   September 2018.  Status post surgery by Dr. Marcelino Scot  . Depression   . Fibromyalgia   . Gait disorder 10/26/2012  . GERD (gastroesophageal reflux disease)   . GI bleed   . Hyperlipidemia   . Hypertension   . Hypothyroid   . Periprosthetic supracondylar fracture of femur, left  03/23/2017  . Restless leg syndrome   . Rhinitis 06/25/2010  . Sleep apnea   . TIA (transient ischemic attack)    3        . Vitamin D deficiency      Health Maintenance  Topic Date Due  . Zoster Vaccines- Shingrix (1 of 2) Never done  . COVID-19 Vaccine (4 - Booster for Pfizer series) 10/05/2020  . INFLUENZA VACCINE  01/28/2021  . FOOT EXAM  03/19/2021  . HEMOGLOBIN A1C  04/18/2021  . OPHTHALMOLOGY EXAM  09/25/2021  . MAMMOGRAM  07/09/2022  . TETANUS/TDAP  01/02/2025  . COLONOSCOPY (Pts 45-63yr Insurance coverage will need to be confirmed)  10/14/2027  . DEXA SCAN  Completed  . Hepatitis C Screening  Completed  . HPV VACCINES  Aged Out     Immunization History  Administered Date(s) Administered  . DT  01/03/2015  . Influenza   02/27/2016, 04/16/2018  . Influenza  03/30/2010  . Influenza, High Dose 03/13/2015, 07/21/2019, 02/16/2020  . Influenza 03/31/2011, 05/14/2014, 02/27/2016  . PFIZER Covid-19 Tri-Sucrose Vacc 09/19/2019, 10/10/2019, 07/13/2020  . PFIZER SARS-COV-2 Vacc 09/19/2019, 10/10/2019  . Pneumococcal -13 06/30/2005, 04/17/2015, 01/23/2019  . Pneumococcal-23 01/06/2017  . Pneumococcal-23 06/30/2005  . Td 06/30/2004  . Zoster, Live 07/01/2007    Last Colon -  10/03/2017 - Dr MEarlean Shawl  - and scheduled [name] 2023 by Dr MEarlean Shawl  Last MGM -  07/09/2020   Past Surgical History:  Procedure Laterality Date  . ABDOMINAL HYSTERECTOMY  1992  . ANTERIOR CERVICAL DECOMPRESSION/DISCECTOMY FUSION 4 LEVELS N/A 06/14/2015   Procedure: Cervical three-four Cervical four-five Cervical five- six Cervical six- seven  Anterior cervical decompression/diskectomy/fusion;  Surgeon: JErline Levine MD;  [facility];  Service: Neurosurgery;  Laterality: N/A;  C3-4 C4-5 C5-6 C6-7 Anterior cervical decompression/diskectomy/fusion  . APPENDECTOMY  1960  . CNew Site . JOINT REPLACEMENT     2   LEFT  1 RIGHT   . KNEE SURGERY    . ORIF FEMUR FRACTURE Left 03/24/2017   Procedure: OPEN REDUCTION INTERNAL FIXATION (ORIF) FEMUR FRACTURE;  Surgeon: HAltamese Drain MD;  [facility]  Service: Orthopedics;  Laterality: Left;  . ROTATOR CUFF REPAIR Left 2005   2 LEFT  1  RIGHT  . SHOULDER ADHESION RELEASE    . SPINE SURGERY    . TOE SURGERY     LEFT       . TOE SURGERY     RIGHT  BIG TOE  . TONSILLECTOMY  1960     Family History  Problem Relation Age of Onset  . Leukemia Sister   . Lupus Sister   . Depression Sister   . Rheum arthritis Sister   . Heart disease Father   . Hypertension Father   . Hyperlipidemia Father   . Neuropathy Father   . Cancer Mother   . Diabetes Mother   . Osteoporosis Mother   .  Breast cancer Mother   . Migraines Daughter      Social History   Tobacco Use  . Smoking status: Never  . Smokeless tobacco: Never  Vaping Use  . Vaping Use: Never used  Substance Use Topics  . Alcohol use: Yes    Alcohol/week: 0.0 standard drinks    Comment: rarely  . Drug use: No      ROS Constitutional: Denies fever, chills, weight loss/gain, headaches, insomnia,  night sweats, and change in appetite. Does c/o fatigue. Eyes: Denies redness, blurred vision, diplopia, discharge, itchy, watery eyes.  ENT: Denies discharge, congestion, post nasal drip, epistaxis, sore throat, earache, hearing loss,  dental pain, Tinnitus, Vertigo, Sinus pain, snoring.  Cardio: Denies chest pain, palpitations, irregular heartbeat, syncope, dyspnea, diaphoresis, orthopnea, PND, claudication, edema Respiratory: denies cough, dyspnea, DOE, pleurisy, hoarseness, laryngitis, wheezing.  Gastrointestinal: Denies dysphagia, heartburn, reflux, water brash, pain, cramps, nausea, vomiting, bloating, diarrhea, constipation, hematemesis, melena, hematochezia, jaundice, hemorrhoids Genitourinary: Denies dysuria, frequency, urgency, nocturia, hesitancy, discharge, hematuria, flank pain Breast: Breast lumps, nipple discharge, bleeding.  Musculoskeletal: Denies arthralgia, myalgia, stiffness, Jt. Swelling, pain, limp, and strain/sprain. Denies falls. Skin: Denies puritis, rash, hives, warts, acne, eczema, changing in skin lesion Neuro: No weakness, tremor, incoordination, spasms, paresthesia, pain Psychiatric: Denies confusion, memory loss, sensory loss. Denies Depression. Endocrine: Denies change in weight, skin, hair change, nocturia, and paresthesia, diabetic polys, visual blurring, hyper / hypo glycemic episodes.  Heme/Lymph: No excessive bleeding, bruising, enlarged lymph nodes.  Physical Exam  There were no vitals taken for this visit.  General Appearance: Overnourished, well groomed and in no apparent distress.  Eyes: PERRLA, EOMs, conjunctiva no swelling or erythema, normal fundi and vessels. Sinuses: No frontal/maxillary tenderness ENT/Mouth: EACs patent / TMs  nl. Nares clear without erythema, swelling, mucoid exudates. Oral hygiene is good. No erythema, swelling, or exudate. Tongue normal, non-obstructing. Tonsils not swollen or erythematous. Hearing normal.  Neck: Supple, thyroid not palpable. No bruits, nodes or JVD. Respiratory: Respiratory effort normal.  BS equal and clear bilateral without rales, rhonci, wheezing or stridor. Cardio: Heart sounds are normal with regular rate and rhythm and no murmurs, rubs  or gallops. Peripheral pulses are normal and equal bilaterally without edema. No aortic or femoral bruits. Chest: symmetric with normal excursions and percussion. Breasts: Symmetric, without lumps, nipple discharge, retractions, or fibrocystic changes.  Abdomen: Flat, soft with bowel sounds active. Nontender, no guarding, rebound, hernias, masses, or organomegaly.  Lymphatics: Non tender without lymphadenopathy.  Musculoskeletal: Full ROM all peripheral extremities, joint stability, 5/5 strength, and normal gait. Skin: Warm and dry without rashes, lesions, cyanosis, clubbing or  ecchymosis.  Neuro:  Mild Lt hemi-facial paresis with widening of Lt interpalpebral fissure. Cranial nerves intact, reflexes equal bilaterally. Normal muscle tone, no cerebellar symptoms. Sensation intact.  Pysch: Alert and oriented X 3, normal affect, Insight and Judgment appropriate.    Assessment and Plan  1. Annual Preventative Screening Examination  2. Essential hypertension  - EKG 12-Lead - Urinalysis, Routine w reflex microscopic - Microalbumin / creatinine urine ratio - CBC with Differential/Platelet - COMPLETE METABOLIC PANEL WITH GFR - Magnesium - TSH  3. Hyperlipidemia associated with type 2 diabetes mellitus ([place])  - EKG 12-Lead - Lipid panel - TSH  4. Type 2 diabetes mellitus with stage 2 chronic kidney  disease, without long-term current use of insulin ([facility])  - EKG 12-Lead - [facility] DIABETES FOOT EXAM - LOW EXTREMITY NEUR EXAM DOCUM - Hemoglobin A1c - Insulin, random  5. Vitamin D deficiency  -  VITAMIN D 25 Hydroxy   6. Aortic atherosclerosis ([place]) by Chest CT scan on 09/2017  - EKG 12-Lead - Lipid panel  7. Hypothyroidism  - TSH  8. Crohn's disease of small intestine without complication ([place])   9. Obstructive sleep apnea  - EKG 12-Lead  10. Screening for colorectal cancer  - POC Hemoccult Bld/Stl   11. Screening for ischemic heart disease  - EKG 12-Lead  12. FHx:  heart disease  - EKG 12-Lead  13. Medication management  - Urinalysis, Routine w reflex microscopic - Microalbumin / creatinine urine ratio - CBC with Differential/Platelet - COMPLETE METABOLIC PANEL WITH GFR - Magnesium - Lipid panel - TSH - Hemoglobin A1c - Insulin, random - VITAMIN D 25 Hydroxy          Patient was counseled in prudent diet to achieve/maintain BMI less than 25 for weight control, BP monitoring, regular exercise and medications. Discussed med's effects and SE's. Screening labs and tests as requested with regular follow-up as recommended. Over 40 minutes of exam, counseling, chart review and high complex critical decision making was performed.   Kirtland Bouchard, MD

## 2022-05-11 NOTE — Progress Notes (Signed)
Annual Screening/Preventative Visit & Comprehensive Evaluation &  Examination   Future Appointments  Date Time Provider Department  05/12/2022                        cpe  2:00 PM Unk Pinto, MD [facility]  05/16/2022  4:00 PM Clayton Bibles, NP [facility]  07/16/2022                          wellness 11:00 AM Alycia Rossetti, NP [facility]  05/18/2023                         cpe  2:00 PM Unk Pinto, MD [facility]         This [name] with  HTN, HLD, diet controlled T2_DM, Hypothyroidism, OSA/BiPAP (hx/o mask intolerance) and Vitamin D Deficiency presents for a Screening /Preventative Visit & comprehensive evaluation and management of multiple medical co-morbidities.   Patient has Crohn's Colitis -  monitored & managed by Dr Earlean Shawl.  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 Chest CT scan in 2019 showed Aortic Atherosclerosis.        Patient is followed by Dr Baxter Kail, [facility] for oral Lichen Planus treating with Flagyl & Fluconazole.   Patient also  has a Chronic Pain Syndrome consequent of multiple Neck & back surgeries & is on Cymbalta/Gabapentin to avoid chronic Opioids. In June, 2022,  patient underwent Rt TKR (26th surgery) .         HTN predates circa 2006 . Patient's BP has been controlled at home and patient denies any cardiac symptoms as chest pain, palpitations, shortness of breath, dizziness or ankle swelling. Today's BP is at goal - 138/80 .        Patient's hyperlipidemia is controlled with diet and medications. Patient denies myalgias or other medication SE's. Last lipids were at goal except elevated trig's:  Lab Results  Component Value Date   CHOL 165 10/17/2020   HDL 35 (L) 10/17/2020   LDLCALC 94 10/17/2020   TRIG 238 (H) 10/17/2020   CHOLHDL 4.7 10/17/2020         Patient has obesity (BMI 30+)  and consequent  diet controlled T2_DM/CKD2 (GFR 75)  since 2016 (A1c 6.9%) and patient  denies reactive hypoglycemic symptoms, visual blurring, diabetic polys or paresthesias. Last A1c was  not at goal:  Lab Results  Component Value Date   HGBA1C 6.0 (H) 10/17/2020         Finally, patient has history of Vitamin D Deficiency ("33" /2008) and last Vitamin D was at goal:  Lab Results  Component Value Date   VD25OH 60 03/20/2020     Current Outpatient Medications on File Prior to Visit  Medication Sig   benzonatate  200 MG capsule Take 1 cap three times daily as needed    bisoprolol 5 MG tablet Take  1/2 to 1  tablet  every Morning     buPROPion  XL  150 MG  TAKE 1 TABLET DAILY   OS-CAL 500  MG Chew 1 tablet  daily.   REFRESH PLUS 0.5 % SOLN Place 1 drop into both eyes 3 times daily as needed (dry eyes).   Cholecalciferol  5000 u Take 5,000 Units daily.   clotrimazole (MYCELEX) 10 MG troche Take 1 tablet  daily.   B-12 SL Place  1 each under the tongue daily.    diclofenac 1 % GEL Apply 4 g topically 4 times daily as needed    DULoxetine 60 MG capsule Take 1 capsule Daily for Chronic Pain   fenofibrate 134 MG capsule Take  1 capsule  Daily  for Triglycerides (Blood Fats)   fluconazole (DIFLUCAN) 150 MG tablet Take  Sundays and Thursday   fluocinonide gel (LIDEX) 0.26 % Apply 1 application topically 2 (two) times daily as needed    FLONASE) 50 MCG/ACT nasal spray 2 sprays into both nostrils daily as needed    gabapentin 300 MG capsule TAKE 1 CAPSULE  THREE TIMES A DAY   gabapentin 600 MG tablet Take 1/2 to 1 tablet 3 to 4 x /Daily as needed for Pain   hydrocortisone 2.5 % ointment Apply 1 application topically daily as needed   Lactobacillus  Take 1 tablet daily.   levothyroxine  100 MCG tablet Take 1 tab 4 x/wk - TWThSS and 1 & 1/2 tab 2x/wk -MF   meclizine 25 MG tablet TAKE 1 TABLET  3  TIMES DAILY AS NEEDED    metroNIDAZOLE 500 MG tablet Take  in the morning and at bedtime.    CORTISPORIN OTIC susp Instill  6 to 8 drops  to affected ear  3 to 4 x /day  and occlude  with cotton   olmesartan  20 MG tablet Take 1/2 to 1 tablet  at Night  for BP   oxybutynin 5 MG tablet TAKE 1 TABLET TWICE A DAY FOR BLADDER CONTROL   rOPINIRole  2 MG tablet TAKE 1 TABLET FOUR TIMES A DAY FOR RESTLESS LEGS   tiZANidine 2 MG tablet Take  1/2 to 1 tablet  every 8 hours  ONLY  if needed for Muscle Spasms   traMADol 50 MG tablet Take every 6 hours as needed for moderate pain.     Allergies  Allergen Reactions   Atorvastatin Other (See Comments)    Extreme pain   Codeine Other (See Comments)    Headache   Pravastatin Other (See Comments)    Exreme pain   Statins Other (See Comments)    Extreme pain   Amitriptyline Other (See Comments)    Unspecified   Carbidopa    Fish Oil Nausea Only   Hydrocodone Other (See Comments)    Itching Other Reaction: Other reaction    Linseed Oil Other (See Comments)    Other Reaction: Other reaction   Nuvigil [Armodafinil] Other (See Comments)    Unspecified   Sulfa Antibiotics Other (See Comments)   Erythromycin Other (See Comments)    Reaction unspecified   Erythromycin Base Rash   Flax Seed [Bio-Flax] Rash    Linseed   Hydrocodone-Acetaminophen Rash   Morphine Nausea And Vomiting and Rash   Nsaids Nausea Only and Other (See Comments)    Other Reaction: Other reaction   Sertraline Rash   Sinemet [Carbidopa W-Levodopa] Rash   Sulfamethoxazole Rash   Tolmetin Nausea Only     Past Medical History:  Diagnosis Date   Arthritis    ASCVD (arteriosclerotic cardiovascular disease)    Closed nondisplaced subtrochanteric fracture of left femur ([place]) 09/03/2017   September 2018.  Status post surgery by Dr. Marcelino Scot   Depression    Fibromyalgia    Gait disorder 10/26/2012   GERD (gastroesophageal reflux disease)    GI bleed    Hyperlipidemia    Hypertension    Hypothyroid    Periprosthetic supracondylar fracture of  femur, left  03/23/2017   Restless leg syndrome    Rhinitis 06/25/2010   Sleep apnea    TIA (transient ischemic  attack)    3         Vitamin D deficiency      Health Maintenance  Topic Date Due   Zoster Vaccines- Shingrix (1 of 2) Never done   COVID-19 Vaccine (4 - Booster for Pfizer series) 10/05/2020   INFLUENZA VACCINE  01/28/2021   FOOT EXAM  03/19/2021   HEMOGLOBIN A1C  04/18/2021   OPHTHALMOLOGY EXAM  09/25/2021   MAMMOGRAM  07/09/2022   TETANUS/TDAP  01/02/2025   COLONOSCOPY (Pts 45-37yr Insurance coverage will need to be confirmed)  10/14/2027   DEXA SCAN  Completed   Hepatitis C Screening  Completed   HPV VACCINES  Aged Out     Immunization History  Administered Date(s) Administered   DT  01/03/2015   Influenza   02/27/2016, 04/16/2018   Influenza  03/30/2010   Influenza, High Dose 03/13/2015, 07/21/2019, 02/16/2020   Influenza 03/31/2011, 05/14/2014, 02/27/2016   PFIZER Covid-19 Tri-Sucrose Vacc 09/19/2019, 10/10/2019, 07/13/2020   PFIZER SARS-COV-2 Vacc 09/19/2019, 10/10/2019   Pneumococcal -13 06/30/2005, 04/17/2015, 01/23/2019   Pneumococcal-23 01/06/2017   Pneumococcal-23 06/30/2005   Td 06/30/2004   Zoster, Live 07/01/2007    Last Colon -  10/03/2017 - Dr MEarlean Shawl  and advised repeat  [name] 2023 by Dr MEarlean Shawl  Last MGM - 07/09/2020   Past Surgical History:  Procedure Laterality Date   ABDOMINAL HYSTERECTOMY  1992   ANTERIOR CERVICAL DECOMPRESSION/DISCECTOMY FUSION 4 LEVELS N/A 06/14/2015   Procedure: Cervical three-four Cervical four-five Cervical five- six Cervical six- seven  Anterior cervical decompression/diskectomy/fusion;  Surgeon: JErline Levine MD;  [facility];  Service: Neurosurgery;  Laterality: N/A;  C3-4 C4-5 C5-6 C6-7 Anterior cervical decompression/diskectomy/fusion   [place]     2   LEFT  1 RIGHT    KNEE SURGERY     ORIF FEMUR FRACTURE Left 03/24/2017   Procedure: OPEN REDUCTION INTERNAL FIXATION (ORIF) FEMUR FRACTURE;  Surgeon: HAltamese La Plata MD;  [facility]   Service: Orthopedics;  Laterality: Left;   ROTATOR CUFF REPAIR Left 2005   2 LEFT  1  RIGHT   SHOULDER ADHESION RELEASE     SPINE SURGERY     TOE SURGERY     LEFT        TOE SURGERY     RIGHT  BIG TOE   TONSILLECTOMY  1960     Family History  Problem Relation Age of Onset   Leukemia Sister    Lupus Sister    Depression Sister    Rheum arthritis Sister    Heart disease Father    Hypertension Father    Hyperlipidemia Father    Neuropathy Father    Cancer Mother    Diabetes Mother    Osteoporosis Mother    Breast cancer Mother    Migraines Daughter      Social History   Tobacco Use   Smoking status: Never   Smokeless tobacco: Never  Vaping Use   Vaping Use: Never used  Substance Use Topics   Alcohol use: Yes    Alcohol/week: 0.0 standard drinks    Comment: rarely   Drug use: No      ROS Constitutional: Denies fever, chills, weight loss/gain, headaches, insomnia,  night sweats, and change in appetite. Does  c/o fatigue. Eyes: Denies redness, blurred vision, diplopia, discharge, itchy, watery eyes.  ENT: Denies discharge, congestion, post nasal drip, epistaxis, sore throat, earache, hearing loss, dental pain, Tinnitus, Vertigo, Sinus pain, snoring.  Cardio: Denies chest pain, palpitations, irregular heartbeat, syncope, dyspnea, diaphoresis, orthopnea, PND, claudication, edema Respiratory: denies cough, dyspnea, DOE, pleurisy, hoarseness, laryngitis, wheezing.  Gastrointestinal: Denies dysphagia, heartburn, reflux, water brash, pain, cramps, nausea, vomiting, bloating, diarrhea, constipation, hematemesis, melena, hematochezia, jaundice, hemorrhoids Genitourinary: Denies dysuria, frequency, urgency, nocturia, hesitancy, discharge, hematuria, flank pain Breast: Breast lumps, nipple discharge, bleeding.  Musculoskeletal: Denies arthralgia, myalgia, stiffness, Jt. Swelling, pain, limp, and strain/sprain. Denies falls. Skin: Denies puritis, rash, hives, warts, acne,  eczema, changing in skin lesion Neuro: No weakness, tremor, incoordination, spasms, paresthesia, pain Psychiatric: Denies confusion, memory loss, sensory loss. Denies Depression. Endocrine: Denies change in weight, skin, hair change, nocturia, and paresthesia, diabetic polys, visual blurring, hyper / hypo glycemic episodes.  Heme/Lymph: No excessive bleeding, bruising, enlarged lymph nodes.  Physical Exam  BP 138/80   Pulse 86   Temp 98 F (36.7 C)   Resp 16   Ht 5' 3"  (1.6 m)   Wt 135 lb 6.4 oz (61.4 kg)   SpO2 99%   BMI 23.99 kg/m   General Appearance: Well nourished, well groomed and in no apparent distress.  Eyes: PERRLA, EOMs, conjunctiva no swelling or erythema, normal fundi and vessels. Sinuses: No frontal/maxillary tenderness ENT/Mouth: EACs patent / TMs  nl. Nares clear without erythema, swelling, mucoid exudates. Oral hygiene is good. No erythema, swelling, or exudate. Tongue normal, non-obstructing. Tonsils not swollen or erythematous. Hearing normal.  Neck: Supple, thyroid not palpable. No bruits, nodes or JVD. Respiratory: Respiratory effort normal.  BS equal and clear bilateral without rales, rhonci, wheezing or stridor. Cardio: Heart sounds are normal with regular rate and rhythm and no murmurs, rubs or gallops. Peripheral pulses are normal and equal bilaterally without edema. No aortic or femoral bruits. Chest: symmetric with normal excursions and percussion. Breasts: deferred to [facility]   08/22/2021 Abdomen: Flat, soft with bowel sounds active. Nontender, no guarding, rebound, hernias, masses, or organomegaly.  Lymphatics: Non tender without lymphadenopathy.  Musculoskeletal: Full ROM all peripheral extremities, joint stability, 5/5 strength, and normal gait. Skin: Warm and dry without rashes, lesions, cyanosis, clubbing or  ecchymosis.  Neuro:  Mild Lt hemi-facial paresis with widening of Lt interpalpebral fissure. Cranial nerves intact, reflexes equal bilaterally.  Normal muscle tone, no cerebellar symptoms. Sensation intact.  Pysch: Alert and oriented X 3, normal affect, Insight and Judgment appropriate.    Assessment and Plan  1. Annual Preventative Screening Examination   2. Essential hypertension  - EKG 12-Lead - Urinalysis, Routine w reflex microscopic - Microalbumin / creatinine urine ratio - CBC with Differential/Platelet - COMPLETE METABOLIC PANEL WITH GFR - Magnesium - TSH  3. Hyperlipidemia associated with type 2 diabetes mellitus ([place])  - EKG 12-Lead - Lipid panel - TSH  4. Type 2 diabetes mellitus with stage 2 chronic kidney  disease, without long-term current use of insulin (HCC)  - EKG 12-Lead - HM DIABETES FOOT EXAM - PR LOW EXTEMITY NEUR EXAM DOCUM - Hemoglobin A1c - Insulin, random  5. Statin myopathy  - Lipid panel  6. Vitamin D deficiency  - VITAMIN D 25 Hydroxy   7. Aortic atherosclerosis ([place]) by Chest CT scan on 09/2017  - EKG 12-Lead  8. Hypothyroidism  - TSH  9. Crohn's disease of small intestine without complication ([place])  - CBC with Differential/Platelet -  COMPLETE METABOLIC PANEL WITH GFR  10. Obstructive sleep apnea   11. Screening for colorectal cancer  - POC Hemoccult Bld/Stl  12. Screening for heart disease  - EKG 12-Lead  13. FHx: heart disease  - EKG 12-Lead  14. Medication management  - Urinalysis, Routine w reflex microscopic - Microalbumin / creatinine urine ratio - CBC with Differential/Platelet - COMPLETE METABOLIC PANEL WITH GFR - Magnesium - Lipid panel - TSH - Hemoglobin A1c - Insulin, random - VITAMIN D 25 Hydroxy          Patient was counseled in prudent diet to achieve/maintain BMI less than 25 for weight control, BP monitoring, regular exercise and medications. Discussed med's effects and SE's. Screening labs and tests as requested with regular follow-up as recommended. Over 40 minutes of exam, counseling, chart review and high complex critical  decision making was performed.   Kirtland Bouchard, MD

## 2022-05-12 ENCOUNTER — Ambulatory Visit (INDEPENDENT_AMBULATORY_CARE_PROVIDER_SITE_OTHER): Payer: Medicare PPO | Admitting: Internal Medicine

## 2022-05-12 ENCOUNTER — Encounter: Payer: Self-pay | Admitting: Internal Medicine

## 2022-05-12 VITALS — BP 138/80 | HR 86 | Temp 98.0°F | Resp 16 | Ht 63.0 in | Wt 135.4 lb

## 2022-05-12 DIAGNOSIS — M797 Fibromyalgia: Secondary | ICD-10-CM

## 2022-05-12 DIAGNOSIS — E785 Hyperlipidemia, unspecified: Secondary | ICD-10-CM | POA: Diagnosis not present

## 2022-05-12 DIAGNOSIS — E1122 Type 2 diabetes mellitus with diabetic chronic kidney disease: Secondary | ICD-10-CM

## 2022-05-12 DIAGNOSIS — E039 Hypothyroidism, unspecified: Secondary | ICD-10-CM

## 2022-05-12 DIAGNOSIS — R260 Ataxic gait: Secondary | ICD-10-CM | POA: Diagnosis not present

## 2022-05-12 DIAGNOSIS — E559 Vitamin D deficiency, unspecified: Secondary | ICD-10-CM

## 2022-05-12 DIAGNOSIS — K5000 Crohn's disease of small intestine without complications: Secondary | ICD-10-CM

## 2022-05-12 DIAGNOSIS — N182 Chronic kidney disease, stage 2 (mild): Secondary | ICD-10-CM | POA: Diagnosis not present

## 2022-05-12 DIAGNOSIS — M79604 Pain in right leg: Secondary | ICD-10-CM | POA: Diagnosis not present

## 2022-05-12 DIAGNOSIS — G720 Drug-induced myopathy: Secondary | ICD-10-CM

## 2022-05-12 DIAGNOSIS — Z0000 Encounter for general adult medical examination without abnormal findings: Secondary | ICD-10-CM | POA: Diagnosis not present

## 2022-05-12 DIAGNOSIS — Z0001 Encounter for general adult medical examination with abnormal findings: Secondary | ICD-10-CM

## 2022-05-12 DIAGNOSIS — R531 Weakness: Secondary | ICD-10-CM | POA: Diagnosis not present

## 2022-05-12 DIAGNOSIS — I700 Atherosclerosis of aorta: Secondary | ICD-10-CM

## 2022-05-12 DIAGNOSIS — Z136 Encounter for screening for cardiovascular disorders: Secondary | ICD-10-CM

## 2022-05-12 DIAGNOSIS — E1169 Type 2 diabetes mellitus with other specified complication: Secondary | ICD-10-CM | POA: Diagnosis not present

## 2022-05-12 DIAGNOSIS — M48062 Spinal stenosis, lumbar region with neurogenic claudication: Secondary | ICD-10-CM | POA: Diagnosis not present

## 2022-05-12 DIAGNOSIS — Z1211 Encounter for screening for malignant neoplasm of colon: Secondary | ICD-10-CM

## 2022-05-12 DIAGNOSIS — I10 Essential (primary) hypertension: Secondary | ICD-10-CM | POA: Diagnosis not present

## 2022-05-12 DIAGNOSIS — Z79899 Other long term (current) drug therapy: Secondary | ICD-10-CM | POA: Diagnosis not present

## 2022-05-12 DIAGNOSIS — Z8249 Family history of ischemic heart disease and other diseases of the circulatory system: Secondary | ICD-10-CM

## 2022-05-12 DIAGNOSIS — G4733 Obstructive sleep apnea (adult) (pediatric): Secondary | ICD-10-CM

## 2022-05-12 MED ORDER — GABAPENTIN 300 MG PO CAPS: ORAL_CAPSULE | ORAL | 3 refills | Status: DC

## 2022-05-12 NOTE — Addendum Note (Signed)
Addended by: Unk Pinto on: 05/12/2022 08:04 PM   Modules accepted: Orders

## 2022-05-12 NOTE — Patient Instructions (Signed)

## 2022-05-13 ENCOUNTER — Other Ambulatory Visit: Payer: Self-pay | Admitting: Internal Medicine

## 2022-05-13 DIAGNOSIS — T8484XD Pain due to internal orthopedic prosthetic devices, implants and grafts, subsequent encounter: Secondary | ICD-10-CM | POA: Diagnosis not present

## 2022-05-13 DIAGNOSIS — Z96651 Presence of right artificial knee joint: Secondary | ICD-10-CM | POA: Diagnosis not present

## 2022-05-13 DIAGNOSIS — N3000 Acute cystitis without hematuria: Secondary | ICD-10-CM

## 2022-05-13 DIAGNOSIS — Z471 Aftercare following joint replacement surgery: Secondary | ICD-10-CM | POA: Diagnosis not present

## 2022-05-13 LAB — CBC WITH DIFFERENTIAL/PLATELET
Absolute Monocytes: 620 cells/uL (ref 200–950)
Basophils Absolute: 99 cells/uL (ref 0–200)
Basophils Relative: 1.6 %
Eosinophils Absolute: 360 cells/uL (ref 15–500)
Eosinophils Relative: 5.8 %
HCT: 41.1 % (ref 35.0–45.0)
Hemoglobin: 13.9 g/dL (ref 11.7–15.5)
Lymphs Abs: 1296 cells/uL (ref 850–3900)
MCH: 28.3 pg (ref 27.0–33.0)
MCHC: 33.8 g/dL (ref 32.0–36.0)
MCV: 83.7 fL (ref 80.0–100.0)
MPV: 9.5 fL (ref 7.5–12.5)
Monocytes Relative: 10 %
Neutro Abs: 3825 cells/uL (ref 1500–7800)
Neutrophils Relative %: 61.7 %
Platelets: 309 10*3/uL (ref 140–400)
RBC: 4.91 10*6/uL (ref 3.80–5.10)
RDW: 12.5 % (ref 11.0–15.0)
Total Lymphocyte: 20.9 %
WBC: 6.2 10*3/uL (ref 3.8–10.8)

## 2022-05-13 LAB — LIPID PANEL
Cholesterol: 174 mg/dL (ref <200)
HDL: 47 mg/dL — ABNORMAL LOW (ref >=50)
LDL Cholesterol (Calc): 99 mg/dL
Non-HDL Cholesterol (Calc): 127 mg/dL (ref <130)
Total CHOL/HDL Ratio: 3.7 (calc) (ref <5.0)
Triglycerides: 182 mg/dL — ABNORMAL HIGH (ref <150)

## 2022-05-13 LAB — COMPLETE METABOLIC PANEL WITH GFR
AG Ratio: 1.6 (calc) (ref 1.0–2.5)
ALT: 11 U/L (ref 6–29)
AST: 21 U/L (ref 10–35)
Albumin: 4.2 g/dL (ref 3.6–5.1)
Alkaline phosphatase (APISO): 62 U/L (ref 37–153)
BUN/Creatinine Ratio: 21 (calc) (ref 6–22)
BUN: 21 mg/dL (ref 7–25)
CO2: 32 mmol/L (ref 20–32)
Calcium: 10.4 mg/dL (ref 8.6–10.4)
Chloride: 104 mmol/L (ref 98–110)
Creat: 1.01 mg/dL — ABNORMAL HIGH (ref 0.60–1.00)
Globulin: 2.6 g/dL (calc) (ref 1.9–3.7)
Glucose, Bld: 90 mg/dL (ref 65–99)
Potassium: 4.2 mmol/L (ref 3.5–5.3)
Sodium: 144 mmol/L (ref 135–146)
Total Bilirubin: 0.4 mg/dL (ref 0.2–1.2)
Total Protein: 6.8 g/dL (ref 6.1–8.1)
eGFR: 59 mL/min/{1.73_m2} — ABNORMAL LOW (ref >=60)

## 2022-05-13 LAB — URINALYSIS, ROUTINE W REFLEX MICROSCOPIC
Bacteria, UA: NONE SEEN /HPF
Bilirubin Urine: NEGATIVE
Glucose, UA: NEGATIVE
Hgb urine dipstick: NEGATIVE
Leukocytes,Ua: NEGATIVE
Nitrite: NEGATIVE
Specific Gravity, Urine: 1.038 — ABNORMAL HIGH (ref 1.001–1.035)
Squamous Epithelial / HPF: NONE SEEN /HPF (ref <=5)
pH: <=5 (ref 5.0–8.0)

## 2022-05-13 LAB — VITAMIN D 25 HYDROXY (VIT D DEFICIENCY, FRACTURES): Vit D, 25-Hydroxy: 56 ng/mL (ref 30–100)

## 2022-05-13 LAB — MICROALBUMIN / CREATININE URINE RATIO
Creatinine, Urine: 212 mg/dL (ref 20–275)
Microalb Creat Ratio: 8 mcg/mg creat (ref <30)
Microalb, Ur: 1.8 mg/dL

## 2022-05-13 LAB — MAGNESIUM: Magnesium: 2 mg/dL (ref 1.5–2.5)

## 2022-05-13 LAB — HEMOGLOBIN A1C
Hgb A1c MFr Bld: 5.5 % of total Hgb (ref <5.7)
Mean Plasma Glucose: 111 mg/dL
eAG (mmol/L): 6.2 mmol/L

## 2022-05-13 LAB — TSH: TSH: 3.83 mIU/L (ref 0.40–4.50)

## 2022-05-13 LAB — MICROSCOPIC MESSAGE

## 2022-05-13 LAB — INSULIN, RANDOM: Insulin: 10.7 u[IU]/mL

## 2022-05-13 NOTE — Progress Notes (Signed)
<><><><><><><><><><><><><><><><><><><><><><><><><><><><><><><><><> <><><><><><><><><><><><><><><><><><><><><><><><><><><><><><><><><> -   Test results slightly outside the reference range are not unusual. If there is anything important, I will review this with you,  otherwise it is considered normal test values.  If you have further questions,  please do not hesitate to contact me at the office or via My Chart.  <><><><><><><><><><><><><><><><><><><><><><><><><><><><><><><><><> <><><><><><><><><><><><><><><><><><><><><><><><><><><><><><><><><>  -  U/A suspicious for UTI - Will send for culture  <><><><><><><><><><><><><><><><><><><><><><><><><><><><><><><><><>  - Kidney functions look dehydrated  !   Very important to drink adequate amounts of fluids to prevent permanent damage    - Recommend drink at least 6 bottles (16 ounces) of fluids /water /day = 96 Oz ~100 oz  - 100 oz = 3,000 cc or 3 liters / day  - >> That's 1 &1/2 bottles of a 2 liter soda bottle /day !  <><><><><><><><><><><><><><><><><><><><><><><><><><><><><><><><><> <><><><><><><><><><><><><><><><><><><><><><><><><><><><><><><><><>  -  Total Chol = 174  & LDL 99  Both  Excellent   - Very low risk for Heart Attack  / Stroke <><><><><><><><><><><><><><><><><><><><><><><><><><><><><><><><><> <><><><><><><><><><><><><><><><><><><><><><><><><><><><><><><><><>  -  But Triglycerides (   182  ) or fats in blood are too high                 (   Ideal or  Goal is less than 150  !  )    - Recommend avoid fried & greasy foods,  sweets / candy,   - Avoid white rice  (brown or wild rice or Quinoa is OK),   - Avoid white potatoes  (sweet potatoes are OK)   - Avoid anything made from white flour  - bagels, doughnuts, rolls, buns, biscuits, white and   wheat breads, pizza crust and traditional  pasta made of white flour & egg white  - (vegetarian pasta or spinach or wheat pasta is OK).    - Multi-grain bread is OK - like  multi-grain flat bread or  sandwich thins.   - Avoid alcohol in excess.   - Exercise is also important. <><><><><><><><><><><><><><><><><><><><><><><><><><><><><><><><><> <><><><><><><><><><><><><><><><><><><><><><><><><><><><><><><><><>  -  A1c  = 5.5% & back in Normal non pre-Diabetic range <><><><><><><><><><><><><><><><><><><><><><><><><><><><><><><><><>  - Vitamin D= 56  - borderline low - Vitamin D goal is between 70-100.   - Please make sure that you are taking your Vitamin D as directed.   - It is very important as a natural anti-inflammatory and helping the  immune system protect against viral infections, like the Covid-19    helping hair, skin, and nails, as well as reducing stroke and  heart attack risk.   - It helps your bones and helps with mood.  - It also decreases numerous cancer risks so please  take it as directed.   - Low Vit D is associated with a 200-300% higher risk for  CANCER   and 200-300% higher risk for HEART   ATTACK  &  STROKE.    - It is also associated with higher death rate at younger ages,   autoimmune diseases like Rheumatoid arthritis, Lupus, Multiple Sclerosis.     - Also many other serious conditions, like depression, Alzheimer's Dementia,   infertility, muscle aches, fatigue, fibromyalgia  <><><><><><><><><><><><><><><><><><><><><><><><><><><><><><><><><> <><><><><><><><><><><><><><><><><><><><><><><><><><><><><><><><><>  - All Else - CBC - Kidneys - Electrolytes - Liver - Magnesium & Thyroid    - all  Normal / OK <><><><><><><><><><><><><><><><><><><><><><><><><><><><><><><><><> <><><><><><><><><><><><><><><><><><><><><><><><><><><><><><><><><>

## 2022-05-14 ENCOUNTER — Telehealth: Payer: Self-pay | Admitting: Gastroenterology

## 2022-05-14 DIAGNOSIS — M79604 Pain in right leg: Secondary | ICD-10-CM | POA: Diagnosis not present

## 2022-05-14 DIAGNOSIS — N3000 Acute cystitis without hematuria: Secondary | ICD-10-CM | POA: Diagnosis not present

## 2022-05-14 DIAGNOSIS — M48062 Spinal stenosis, lumbar region with neurogenic claudication: Secondary | ICD-10-CM | POA: Diagnosis not present

## 2022-05-14 DIAGNOSIS — R531 Weakness: Secondary | ICD-10-CM | POA: Diagnosis not present

## 2022-05-14 DIAGNOSIS — R260 Ataxic gait: Secondary | ICD-10-CM | POA: Diagnosis not present

## 2022-05-14 NOTE — Telephone Encounter (Signed)
Received records from Dr. Earlean Shawl called patient to ask the reason for request and if she has any specific provider. No answer no mailbox.

## 2022-05-15 DIAGNOSIS — H26493 Other secondary cataract, bilateral: Secondary | ICD-10-CM | POA: Diagnosis not present

## 2022-05-16 ENCOUNTER — Encounter: Payer: Self-pay | Admitting: Nurse Practitioner

## 2022-05-16 ENCOUNTER — Ambulatory Visit: Payer: Medicare PPO | Admitting: Nurse Practitioner

## 2022-05-16 ENCOUNTER — Telehealth: Payer: Self-pay | Admitting: Gastroenterology

## 2022-05-16 ENCOUNTER — Ambulatory Visit (INDEPENDENT_AMBULATORY_CARE_PROVIDER_SITE_OTHER): Payer: Medicare PPO | Admitting: Pulmonary Disease

## 2022-05-16 VITALS — BP 116/80 | HR 71 | Temp 98.5°F | Ht 63.5 in | Wt 138.2 lb

## 2022-05-16 DIAGNOSIS — R0609 Other forms of dyspnea: Secondary | ICD-10-CM

## 2022-05-16 DIAGNOSIS — G4733 Obstructive sleep apnea (adult) (pediatric): Secondary | ICD-10-CM

## 2022-05-16 DIAGNOSIS — J479 Bronchiectasis, uncomplicated: Secondary | ICD-10-CM | POA: Diagnosis not present

## 2022-05-16 LAB — URINALYSIS, ROUTINE W REFLEX MICROSCOPIC
Bacteria, UA: NONE SEEN /HPF
Glucose, UA: NEGATIVE
Hgb urine dipstick: NEGATIVE
Hyaline Cast: NONE SEEN /LPF
Nitrite: NEGATIVE
Specific Gravity, Urine: 1.038 — ABNORMAL HIGH (ref 1.001–1.035)
Squamous Epithelial / HPF: NONE SEEN /HPF (ref <=5)
pH: <=5 (ref 5.0–8.0)

## 2022-05-16 LAB — PULMONARY FUNCTION TEST
DL/VA % pred: 80 %
DL/VA: 3.31 ml/min/mmHg/L
DLCO cor % pred: 80 %
DLCO cor: 15.12 ml/min/mmHg
DLCO unc % pred: 81 %
DLCO unc: 15.34 ml/min/mmHg
FEF 25-75 Pre: 2.24 L/sec
FEF2575-%Pred-Pre: 130 %
FEV1-%Pred-Pre: 107 %
FEV1-Pre: 2.26 L
FEV1FVC-%Pred-Pre: 109 %
FEV6-%Pred-Pre: 102 %
FEV6-Pre: 2.72 L
FEV6FVC-%Pred-Pre: 104 %
FVC-%Pred-Pre: 97 %
FVC-Pre: 2.74 L
Pre FEV1/FVC ratio: 82 %
Pre FEV6/FVC Ratio: 100 %
RV % pred: 84 %
RV: 1.88 L
TLC % pred: 116 %
TLC: 5.73 L

## 2022-05-16 LAB — URINE CULTURE
MICRO NUMBER:: 14193144
Result:: NO GROWTH
SPECIMEN QUALITY:: ADEQUATE

## 2022-05-16 NOTE — Telephone Encounter (Signed)
Called patient to schedule . Phone just rings. Did not leave a msg.

## 2022-05-16 NOTE — Assessment & Plan Note (Signed)
Stable. No increased cough or chest congestion. Breathing has improved since August and is back to her baseline. No activity limitations. No obstruction on PFTs today.

## 2022-05-16 NOTE — Progress Notes (Signed)
Reviewed and agree with assessment/plan.   [name], MD [facility] Pulmonary/Critical Care 05/16/2022, 4:32 PM Pager:  [phone]

## 2022-05-16 NOTE — Patient Instructions (Addendum)
Continue flonase 2 sprays each nostril daily  Continue Albuterol inhaler 2 puffs every 6 hours as needed for shortness of breath or wheezing. Notify if symptoms persist despite rescue inhaler/neb use.  Continue omeprazole 1 tab daily    Follow up with Dr. Wilburn Cornelia for [facility] device I hope everything goes well with your surgery!!    Follow up in 8 weeks for [facility] activation appointment with Dr. Halford Chessman or Alanson Aly. If symptoms worsen, please contact office for sooner follow up or seek emergency care.

## 2022-05-16 NOTE — Progress Notes (Signed)
@Patient  ID: Crystal [name], female    DOB: [birth year], 71 y.o.   MRN: [id number]  Chief Complaint  Patient presents with   Follow-up    Follow up after PFT.     Referring provider: Unk Pinto, MD  HPI: 71 year old female, never smoker followed for OSA intolerant of CPAP and bronchiectasis.  She is a patient of Crystal [name] and last seen in office on 02/11/2022 by [facility] NP.  Past medical history significant for hypertension, atherosclerosis, HLD, DM 2, GERD, OA, RLS, fibromyalgia, MDD, vitamin D deficiency.  She is followed by Crystal [name] with ENT for chronic rhinitis and with narrow nasal angles and alar collapse; awaiting sinus surgery.  TEST/EVENTS:  11/21/2015 PFTs: FEV1 2.51 (110%), FEV1% 85%, TLC 88%, DLCO 77% 12/04/2015 HST: AHI 30.7, SPO2 low 73%.  Severe obstructive sleep apnea. 10/01/2017 CT chest: 3 mm left lower lobe nodule, mild cylindrical BTX in the right side. 11/06/2021 HST: AHI 36.5, SPO2 low 73%.  Severe obstructive sleep apnea. 02/28/2022 echocardiogram: EF 55-60%. RV size and function nl. Mild MR.  05/16/2022 PFT: FVC 97, FEV1 107, ratio 82, TLC 116, DLCOcor 80  08/20/2021: OV with Crystal [name].  Breathing overall stable.  Advised to continue as needed antitussives and expectorants for regional bronchiectasis in setting of reflux.  Patient wanted to know more information about inspire device that she has been intolerant of CPAP therapy. Has lost significant weight and feels like she is sleeping better.  Arranged for home sleep study for further evaluation of ongoing OSA.    11/11/2021: OV with Crystal Keys NP to discuss home sleep study results which showed that she still has severe obstructive sleep apnea.  She has been intolerant of CPAP therapy in the past due to her ongoing nasal problems.  She has tried multiple masks including fullface, nasal mask and nasal pillow.  She has tried wearing the machine numerous times but ends up taking it off halfway through the night.   Wants to move forward with inspire device if possible.  She denies any drowsy driving, morning headaches, narcolepsy or cataplexy.  RLS is well controlled on Requip.  Reports that breathing is overall stable; has not had any flares recently. Referred to ENT for [facility] consultation.   02/11/2022: OV with Crystal Tapley NP for acute visit.  She underwent lumbar surgery in March and reports increased DOE from baseline and dizziness since. She was evaluated in the ED on 7/14 with full workup and no remarkable findings. CTA negative for PE or acute cardiopulmonary process. Troponins neg and EKG with sinus tachycardia. Labs stable. PCP stopped her antihypertensives. She has been feeling better over the past few weeks. Awaiting appt with cardiology - likely needs heart monitor for evaluation of underlying arrhythmia. I am going to order echocardiogram for further evaluation as well; she has severe untreated OSA and is at increase risk for [facility]. May need right heart cath if echo unrevealing and symptoms persist. We also are going to set her up for PFTs for further evaluation. PRN albuterol rx sent today.    05/16/2022: Today - follow up Patient presents today for follow-up after undergoing pulmonary function testing which is relatively unremarkable aside from some mild increase in lung volumes.  She declined to do postbronchodilator testing as she did not want to have the albuterol administered during the test.  She tells me today that most of her symptoms have resolved since she was here last.  She actually feels like her breathing is back to her  baseline.  She is not really sure exactly what changed.  She feels like she has been able to do her exercises and participate in activities like she used to.  Palpitations have also gotten better.  She does still have some dizziness, which they attributed to vertigo.  Denies any cough, wheezing or chest congestion.  No PND, orthopnea, chest pain, syncope. She is still having some  occasional lower extremity swelling.  Her previous echocardiogram showed normal EF and she did not have any evidence of right heart strain to be concern for pulmonary hypertension.  She is getting ready to undergo inspire device placement, scheduled for 11/30.  Does have chronic rhinitis, which she uses Flonase for.  She is also considering getting some stents placed in each of her nostrils but is not sure if she wants to do this.  She plans to talk to Crystal [name] about this more.  Overall, she is feeling better compared to when she was here last and has no more concern surrounding her breathing.  Allergies  Allergen Reactions   Methocarbamol Nausea Only, Other (See Comments) and Shortness Of Breath    Other reaction(s): Dizziness, psychological reaction Dizzy, disoriented Sleepy and weight loss    Codeine Other (See Comments)    Headache   Statins Other (See Comments)    Extreme pain   Oxycodone     Dizziness, unbalanced.   Amitriptyline Other (See Comments)    Confusion and hallucinations   Erythromycin Base Rash    Flushing/red face.   Fish Oil Nausea Only   Flax Seed [Flax Seed Oil] Rash   Flaxseed (Linseed) Rash   Hydrocodone Itching and Rash        Morphine Nausea And Vomiting and Rash   Nsaids Nausea Only   Nuvigil [Armodafinil] Other (See Comments)    Unspecified   Sertraline Rash   Sinemet [Carbidopa W-Levodopa] Rash   Sulfa Antibiotics Rash   Tolmetin Nausea Only    Immunization History  Administered Date(s) Administered   DT (Pediatric) 01/03/2015   Fluad Quad(high Dose 65+) 04/25/2022   Influenza Split 03/30/2010, 03/31/2011, 05/14/2014, 02/27/2016, 04/16/2018   Influenza Whole 03/30/2010   Influenza, High Dose Seasonal PF 03/13/2015, 07/21/2019, 02/16/2020, 04/08/2021   Influenza-Unspecified 03/31/2011, 05/14/2014, 02/27/2016, 04/16/2018   PFIZER Comirnaty(Gray Top)Covid-19 Tri-Sucrose Vaccine 09/19/2019, 10/10/2019, 07/13/2020   PFIZER(Purple Top)SARS-COV-2  Vaccination 09/19/2019, 10/10/2019   Pfizer Covid-19 Vaccine Bivalent Booster 62yr & up 05/14/2021   Pneumococcal Conjugate-13 06/30/2005, 04/17/2015, 01/23/2019   Pneumococcal Polysaccharide-23 01/06/2017   Pneumococcal-Unspecified 06/30/2005   Td 06/30/2004   Td,absorbed, Preservative Free, Adult Use, Lf Unspecified 06/30/2004   Unspecified SARS-COV-2 Vaccination 04/25/2022   Zoster Recombinat (Shingrix) 05/14/2021, 08/22/2021   Zoster, Live 07/01/2007    Past Medical History:  Diagnosis Date   Arthritis    ASCVD (arteriosclerotic cardiovascular disease)    Closed nondisplaced subtrochanteric fracture of left femur (HTreasure 09/03/2017   September 2018.  Status post surgery by Dr. HMarcelino Scot  Depression    Dyspnea    Fibromyalgia    Gait disorder 10/26/2012   GERD (gastroesophageal reflux disease)    GI bleed    Hyperlipidemia    Hypertension    Hypothyroid    Periprosthetic supracondylar fracture of femur, left  03/23/2017   Pneumonia    Pre-diabetes    Restless leg syndrome    Rhinitis 06/25/2010   Sleep apnea    does not use CPAP   Spinal headache    with the C section   TIA (  transient ischemic attack)    3         Vitamin D deficiency     Tobacco History: Social History   Tobacco Use  Smoking Status Never   Passive exposure: Past  Smokeless Tobacco Never   Counseling given: Not Answered   Outpatient Medications Prior to Visit  Medication Sig Dispense Refill   acetaminophen (TYLENOL) 500 MG tablet Take 1,000 mg by mouth every 6 (six) hours as needed for moderate pain, mild pain or headache.     albuterol (VENTOLIN HFA) 108 (90 Base) MCG/ACT inhaler Inhale 2 puffs into the lungs every 6 (six) hours as needed for wheezing or shortness of breath. 8 g 2   benzonatate (TESSALON) 200 MG capsule TAKE 1 CAP THREE TIMES DAILY AS NEEDED FOR COUGH. 30 capsule 1   buPROPion (WELLBUTRIN XL) 150 MG 24 hr tablet TAKE 1 TABLET DAILY FOR MOOD, FOCUS & CONCENTRATION 90 tablet 3    carboxymethylcellulose (REFRESH PLUS) 0.5 % SOLN Place 1 drop into both eyes 3 (three) times daily as needed (dry eyes).     clobetasol ointment (TEMOVATE) 0.27 % Apply 1 Application topically 3 (three) times daily.     clotrimazole (MYCELEX) 10 MG troche Take 10 mg by mouth 3 (three) times daily.     dexamethasone (DECADRON) 0.5 MG/5ML solution Take 0.5 mg by mouth 3 (three) times daily.     diclofenac Sodium (VOLTAREN) 1 % GEL Apply 2 g topically daily as needed (pain).     docusate sodium (COLACE) 100 MG capsule Take 100 mg by mouth daily as needed for mild constipation or moderate constipation.     DULoxetine (CYMBALTA) 60 MG capsule TAKE 1 CAPSULE DAILY FOR CHRONIC PAIN 90 capsule 3   fenofibrate micronized (LOFIBRA) 134 MG capsule TAKE 1 CAPSULE BY MOUTH DAILY BEFORE BREAKFAST. 90 capsule 0   Ferrous Sulfate (IRON PO) Take 25 mg by mouth daily. gentle iron     fluticasone (FLONASE) 50 MCG/ACT nasal spray USE TWO SPRAYS IN EACH NOSTRIL DAILY AS NEEDED (Patient taking differently: Place 2 sprays into both nostrils at bedtime.) 48 g 3   gabapentin (NEURONTIN) 300 MG capsule Take 1 capsule 3 x /day for Chronic Pain         ( Dx: m79.7)                                               /                                TAKE                                   BY                                  MOUTH 270 capsule 3   guaiFENesin (MUCUS RELIEF ADULT PO) Take 1 capsule by mouth daily.     hydroxychloroquine (PLAQUENIL) 200 MG tablet Take 200 mg by mouth 2 (two) times daily.     levothyroxine (SYNTHROID) 88 MCG tablet Take  1 tablet  Daily  on an empty stomach with only water for 30 minutes &  no Antacid meds, Calcium or Magnesium for 4 hours & avoid Biotin 90 tablet 3   meclizine (ANTIVERT) 25 MG tablet TAKE 1 TABLET (25 MG TOTAL) BY MOUTH 3 (THREE) TIMES DAILY AS NEEDED FOR DIZZINESS. 90 tablet 0   mupirocin ointment (BACTROBAN) 2 % Apply 1 Application topically 3 (three) times daily.      neomycin-polymyxin-hydrocortisone (CORTISPORIN) OTIC solution Place 3 drops into both ears daily as needed (ear inflammation).     omeprazole (PRILOSEC) 40 MG capsule TAKE 1 CAPSULE (40 MG TOTAL) BY MOUTH DAILY. 90 capsule 1   OVER THE COUNTER MEDICATION Place 1 mL under the tongue daily. D3 5000 units K2 120 mcg     oxybutynin (DITROPAN) 5 MG tablet Take 5 mg by mouth daily.     polyethylene glycol (MIRALAX / GLYCOLAX) 17 g packet Take 17-34 g by mouth at bedtime. Dose depends on degree of constipation     rOPINIRole (REQUIP) 2 MG tablet Take 1-2 tablets (2-4 mg total) by mouth See admin instructions. Take 2 mg AM, 2 mg at dinner time, and 4 mg  at bedtime. 120 tablet 3   tiZANidine (ZANAFLEX) 2 MG tablet Take 2 mg by mouth at bedtime.     traMADol (ULTRAM) 50 MG tablet Take 50 mg by mouth every 6 (six) hours as needed for moderate pain or severe pain.     No facility-administered medications prior to visit.     Review of Systems:   Constitutional: No weight loss or gain, night sweats, fevers, chills, or lassitude. +fatigue (baseline) HEENT: No headaches, difficulty swallowing, tooth/dental problems, or sore throat. No sneezing, itching, ear ache. + Chronic nasal congestion CV:  +swelling in lower extremities (baseline). No chest pain, palpitations, orthopnea, PND, anasarca, syncope Resp: No shortness of breath with exertion or at rest. No cough. No excess mucus or change in color of mucus. No hemoptysis. No wheezing.  No chest wall deformity GI:  No heartburn, indigestion, abdominal pain, nausea, vomiting, diarrhea, change in bowel habits, loss of appetite, bloody stools.  Skin: No rash, lesions, ulcerations MSK:  No joint pain or swelling.  No decreased range of motion.  No back pain. Neuro: +unsteady gait (baseline); numbness in right lower extremity (baseline), vertigo. No cognitive changes. No memory impairment.  Psych: No depression or anxiety. Mood stable. +sleep disturbance  (baseline)    Physical Exam:  BP 116/80 (BP Location: Right Arm, Patient Position: Sitting, Cuff Size: Normal)   Pulse 71   Temp 98.5 F (36.9 C) (Oral)   Ht 5' 3.5" (1.613 m)   Wt 138 lb 3.2 oz (62.7 kg)   SpO2 96%   BMI 24.10 kg/m   GEN: Pleasant, interactive, well-appearing; in no acute distress. HEENT:  Normocephalic and atraumatic.  Chronic right facial droop.  PERRLA. Sclera white. Nasal turbinates pink, moist and patent bilaterally. No rhinorrhea present. Oropharynx pink and moist, without exudate or edema. No lesions, ulcerations, or postnasal drip.  NECK:  Supple w/ fair ROM. No JVD present. Normal carotid impulses w/o bruits. Thyroid symmetrical with no goiter or nodules palpated. No lymphadenopathy.   CV: RRR, no m/r/g, no peripheral edema. Pulses intact, +2 bilaterally. No cyanosis, pallor or clubbing. PULMONARY:  Unlabored, regular breathing. Clear bilaterally A&P w/o wheezes/rales/rhonchi. No accessory muscle use. No dullness to percussion. GI: BS present and normoactive. Soft, non-tender to palpation. No organomegaly or masses detected. MSK: No erythema, warmth or tenderness. Cap refil <2 sec all extrem. No deformities or joint swelling noted.  Neuro:  A/Ox3. No focal deficits noted.   Skin: Warm, no lesions or rashe Psych: Normal affect and behavior. Judgement and thought content appropriate.     Lab Results:  CBC    Component Value Date/Time   WBC 6.2 05/12/2022 1534   RBC 4.91 05/12/2022 1534   HGB 13.9 05/12/2022 1534   HGB 12.9 10/20/2012 1358   HCT 41.1 05/12/2022 1534   HCT 40.2 10/20/2012 1358   PLT 309 05/12/2022 1534   PLT 265 10/20/2012 1358   MCV 83.7 05/12/2022 1534   MCV 82.1 10/20/2012 1358   MCH 28.3 05/12/2022 1534   MCHC 33.8 05/12/2022 1534   RDW 12.5 05/12/2022 1534   RDW 16.9 (H) 10/20/2012 1358   LYMPHSABS 1,296 05/12/2022 1534   LYMPHSABS 1.3 10/20/2012 1358   MONOABS 0.7 01/19/2021 2316   MONOABS 0.6 10/20/2012 1358   EOSABS  360 05/12/2022 1534   EOSABS 0.1 10/20/2012 1358   BASOSABS 99 05/12/2022 1534   BASOSABS 0.1 10/20/2012 1358    BMET    Component Value Date/Time   NA 144 05/12/2022 1534   NA 142 03/28/2017 0000   NA 141 10/20/2012 1358   K 4.2 05/12/2022 1534   K 3.9 10/20/2012 1358   CL 104 05/12/2022 1534   CL 104 10/20/2012 1358   CO2 32 05/12/2022 1534   CO2 27 10/20/2012 1358   GLUCOSE 90 05/12/2022 1534   GLUCOSE 109 (H) 10/20/2012 1358   BUN 21 05/12/2022 1534   BUN 14 03/28/2017 0000   BUN 15.6 10/20/2012 1358   CREATININE 1.01 (H) 05/12/2022 1534   CREATININE 1.1 10/20/2012 1358   CALCIUM 10.4 05/12/2022 1534   CALCIUM 7.8 (L) 03/26/2017 0354   CALCIUM 9.7 10/20/2012 1358   GFRNONAA >60 01/10/2022 1551   GFRNONAA 80 07/04/2020 1457   GFRAA 93 07/04/2020 1457    BNP    Component Value Date/Time   BNP 32.3 01/19/2021 2316     Imaging:  No results found.       Latest Ref Rng & Units 05/16/2022    2:49 PM 11/21/2015   10:51 AM  PFT Results  FVC-Pre L 2.74  P 2.96   FVC-Predicted Pre % 97  P 98   FVC-Post L  2.88   FVC-Predicted Post %  95   Pre FEV1/FVC % % 82  P 85   Post FEV1/FCV % %  86   FEV1-Pre L 2.26  P 2.51   FEV1-Predicted Pre % 107  P 110   FEV1-Post L  2.49   DLCO uncorrected ml/min/mmHg 15.34  P 18.12   DLCO UNC% % 81  P 77   DLCO corrected ml/min/mmHg 15.12  P 17.65   DLCO COR %Predicted % 80  P 75   DLVA Predicted % 80  P 85   TLC L 5.73  P 4.36   TLC % Predicted % 116  P 88   RV % Predicted % 84  P 59     P Preliminary result    No results found for: "NITRICOXIDE"      Assessment & Plan:   Obstructive sleep apnea Severe OSA with history of intolerance to CPAP.  She is going to be undergoing inspire device placement on 11/30 with Crystal [name].  She will need follow-up 6 weeks after this for activation.  We will make sure that this gets scheduled.  Cautioned on safe driving practices in the interim.  Patient Instructions   Continue flonase 2 sprays each  nostril daily  Continue Albuterol inhaler 2 puffs every 6 hours as needed for shortness of breath or wheezing. Notify if symptoms persist despite rescue inhaler/neb use.  Continue omeprazole 1 tab daily    Follow up with Crystal [name] for [facility] device I hope everything goes well with your surgery!!    Follow up in 8 weeks for Inspire activation appointment with Crystal [name] or Alanson Aly. If symptoms worsen, please contact office for sooner follow up or seek emergency care.   Dyspnea on exertion Possible this was related to postprocedure deconditioning.  Clinically improved.  Her pulmonary function testing today was overall unremarkable with some mild hyperinflation.  She does not feel like she has any significant symptoms that need treatment at this time.  She will notify us if she feels like her breathing worsens again.  She has albuterol available to her should she need it for shortness of breath   Bronchiectasis without complication (HCC) Stable. No increased cough or chest congestion. Breathing has improved since August and is back to her baseline. No activity limitations. No obstruction on PFTs today.    I spent 28 minutes of dedicated to the care of this patient on the date of this encounter to include pre-visit review of records, face-to-face time with the patient discussing conditions above, post visit ordering of testing, clinical documentation with the electronic health record, making appropriate referrals as documented, and communicating necessary findings to members of the patients care team.  Clayton Bibles, NP 05/16/2022  Pt aware and understands NP's role.

## 2022-05-16 NOTE — Progress Notes (Signed)
<><><><><><><><><><><><><><><><><><><><><><><><><><><><><><><><><> <><><><><><><><><><><><><><><><><><><><><><><><><><><><><><><><><>  -   Culture - OK - No Infection - No Abx needed  <><><><><><><><><><><><><><><><><><><><><><><><><><><><><><><><><> <><><><><><><><><><><><><><><><><><><><><><><><><><><><><><><><><>

## 2022-05-16 NOTE — Assessment & Plan Note (Signed)
Severe OSA with history of intolerance to CPAP.  She is going to be undergoing inspire device placement on 11/30 with Dr. Wilburn Cornelia.  She will need follow-up 6 weeks after this for activation.  We will make sure that this gets scheduled.  Cautioned on safe driving practices in the interim.  Patient Instructions  Continue flonase 2 sprays each nostril daily  Continue Albuterol inhaler 2 puffs every 6 hours as needed for shortness of breath or wheezing. Notify if symptoms persist despite rescue inhaler/neb use.  Continue omeprazole 1 tab daily    Follow up with Dr. Wilburn Cornelia for [facility] device I hope everything goes well with your surgery!!    Follow up in 8 weeks for Inspire activation appointment with Dr. Halford Chessman or Alanson Aly. If symptoms worsen, please contact office for sooner follow up or seek emergency care.

## 2022-05-16 NOTE — Assessment & Plan Note (Signed)
Possible this was related to postprocedure deconditioning.  Clinically improved.  Her pulmonary function testing today was overall unremarkable with some mild hyperinflation.  She does not feel like she has any significant symptoms that need treatment at this time.  She will notify us if she feels like her breathing worsens again.  She has albuterol available to her should she need it for shortness of breath

## 2022-05-16 NOTE — Telephone Encounter (Signed)
That's fine. I am happy to see her for routine new patient office visit

## 2022-05-16 NOTE — Telephone Encounter (Signed)
Good morning Dr Havery Moros  We have received a request for patient to have her GI care transferred over to you from [facility].  Patient last seen Dr Earlean Shawl 02/27/21. Patient is wanting to establish care due to him retiring.  Records are available for review in [facility]. Please review and advise on scheduling.  Thank you

## 2022-05-16 NOTE — Progress Notes (Signed)
PFT done today. 

## 2022-05-19 ENCOUNTER — Other Ambulatory Visit: Payer: Self-pay | Admitting: Otolaryngology

## 2022-05-19 DIAGNOSIS — M79604 Pain in right leg: Secondary | ICD-10-CM | POA: Diagnosis not present

## 2022-05-19 DIAGNOSIS — R531 Weakness: Secondary | ICD-10-CM | POA: Diagnosis not present

## 2022-05-19 DIAGNOSIS — R260 Ataxic gait: Secondary | ICD-10-CM | POA: Diagnosis not present

## 2022-05-19 DIAGNOSIS — M48062 Spinal stenosis, lumbar region with neurogenic claudication: Secondary | ICD-10-CM | POA: Diagnosis not present

## 2022-05-19 NOTE — Progress Notes (Signed)
Follow Up Pharmacist Visit (CCM)  Patient's Chronic Conditions: Hypertension (HTN), Gastroesophageal Reflux Disease (GERD), Hyperlipidemia/Dyslipidemia (HLD), Diabetes (DM), Chronic Kidney Disease (CKD), Hypothyroidism, Depression, Anemia, Cardiovascular Disease (CVD) . Summary for PCP:  1. PCP please implement Zetia or Repatha for LDL goal <70. 2. Patient to increase water to 64oz daily. 3. Change CKD diagnosis to stage 3a. 4. PCP please add statin myopathy to problem list. Doctor and Hospital Visits Were there PCP Visits since last visit with the Pharmacist?: Yes PCP Visits details: 03/05/22-Tonya Cranford, NP- Pt. presented for f/u. BP 130/70 HR 92. No medication changes noted.  04/24/22-Dr. McKeown-Pt. presented for c/o pain in her neck, hands, knees & feet. BP 130/98 HR 102. STARTED  Turmeric/Bioperine 1,000-1,230m-5-10mg BID Cinnamon 1,002mBID . Were there Specialist Visits since last visit with the Pharmacist?: Yes Specialist Visits details: 02/13/22-Culton, DoDuard LarsenMD (Derm)-Pt. presented for f/u. STARTED: Hydroxychloroquine 20051mID, Mupirocin apply TID PRN STOPPED: fluconazole 150 mg daily given potential interaction with hydroxychloroquine   02/21/22-Bedlack, RicBarbie HaggisMD  (Neuro)-Pt. presented for chorea.  02/24/22-Branch, Mary E, MD (Cardiology)-Pt. presented for c/o chest pain. STOPPED  Mupirocin 2 % 1 Application 2 times daily (Patient Preference) Patient not taking:  Reported on 02/24/2022   03/11/22-Sarah DesShearon StallsC (Neuro)-Pt. presented for f/u. No medication changes noted.'  04/02/22-Shoemaker, DavRaylene MiyamotoD  (ENT)-Pt. presented for f/u. No medication changes noted.  04/21/22-Dr. Mcdaniel- Pt. presented for f/u. No medication changes noted. . Was there a Hospital Visit in last 30 days?: No Were there other Hospital Visits since last visit with the Pharmacist?: No . Subjective Information SDOH questions were documented and reviewed (EMR or  [employer]) within the past 12 months or since hospitalization?: Yes Date last SDOH questions were completed?: 02/11/2022 Summarize SDOH needs:: None . Medication Adherence [facility]?: Yes Medication adherence rates for the STAR metric medications:  Pt. not taking any STAR metric medications  Medication adherence rates for non-STAR metric medications:  Bupropion 150m42m7/23 & 09/19/21 90DS Clobetasol 0.05%-02/13/22 60DS & 04/26/22 60DS Clotrimazole 10mg85m0/23 30DS & 04/13/22 30DS Dexamethasone 0.5mg/538m4/27/23 10DS & 02/09/22 10DS Oxybutynin 5mg-4/16m23 90DS & 01/17/22 90DS Tizanidine 2mg-12/42m22 90DS & 07/31/21 90DS . Factors that may affect medication adherence?: Pill burden, Therapy changed frequently, Lack of understanding / insight of condition, Perceived lack of benefit of therapy Is Patient using [facility] pharmacy?: No Name and location of Current pharmacy: [facility] Current Rx insurance plan: [employer] Are meds synced by current pharmacy?: No Are meds delivered by current pharmacy?: No - delivery not available Would patient benefit from direct intervention of clinical lead in dispensing process to optimize clinical outcomes?: Yes Are [facility] pharmacy services available where patient lives?: Yes Is patient disadvantaged to use [facility]?: Yes Does patient experience delays in picking up medications due to transportation concerns (getting to pharmacy)?: No Medication organization: None- a mess. Has been addressed twice without change We discussed: Use of adherence aids (pillbox, alarms), Use of pill packaging Assessment:: Potentially Non-adherent Plan/Follow up: Would like her to have pill pack, but declined. . Hypertension (HTN) Most Recent BP: 138/80 Most Recent HR: 86 taken on: 05/12/2022 Care Gap: Need BP documented or last BP 140/90 or higher: Addressed Assessed today?: No Drug: None . Hyperlipidemia/Dyslipidemia (HLD) Last  Lipid panel on: 05/12/2022 TC (Goal<200): 174 LDL: 99 HDL (Goal>40): 47 TG (Goal<150): 182 ASCVD 10-year risk?is:: Intermediate (7.5%-20%) ASCVD Risk Score: 13.0% Assessed today?: Yes LDL Goal: <70 Additional Info: Allergy to statins We discussed: Complications  of hyperlipidemia and prevention methods with patient for 5-10 minutes, Increasing exercise (walking, biking, swimming) to a goal of 30 minutes per day, as able based on current activity level and health or as directed by your healthcare provider, How a diet high in fruits/vegetables/nuts/whole grains/beans may help to reduce your cholesterol. Increasing soluble fiber intake.  Avoiding sugary foods and trans fat, limiting carbohydrates, and reducing portion sizes. 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 PCP consider zetia or repatha for goal LDL < 70 . Diabetes (DM) Most recent A1C: 5.5 taken on: 05/12/2022 Most Recent GFR: 59 taken on: 05/12/2022 Type: Pre-Diabetes/Impaired Fasting Glucose Assessed today?: No Drug: None . Depression Most recent PHQ-9 Score: Unknown taken on: 07/16/2021 Assessed today?: No Drug: Bupropion 163m-QD Pharmacist Assessment: Appropriate, Effective, Safe, Accessible . GERD Assessment Assessed today?: No Drug: Omeprazole 420m1 tab QD Pharmacist Assessment: Appropriate, Effective, Safe, Accessible . Chronic kidney disease (CKD) Most Recent GFR: 59 taken on: 05/12/2022 Previous GFR: 78 taken on: 01/08/2022 Most recent microalbumin ratio: <0.23m4mested on: 04/11/2021 Assessed today?: Yes CKD Stage: Stage 3a (GFR 45-60 mL/min) Albuminuria Stage: A1 (<30) Contributing factors for developing CKD: Diabetes, HTN Is Patient taking statin medication: No Is patient taking ACEi / ARB?: No Renal dose adjustments recommended?: No Additional Info: Dehydration an issue We  discussed: Limiting dietary sodium intake to less than 2000 mg / day, Maintaining blood glucose control, Maintaining blood pressure control, Avoidance of nephrotoxic drugs (NSAIDs) Assessment:: Uncontrolled Drug: None Plan/Follow up: Increase water to 64oz daily . Cardiovascular Disease (CVD) Care Gap: Moderate / high intensity statin therapy needed: Needs to be addressed Assessed today?: No . Hypothyroidism Most recent TSH: 2.45 taken on: 01/08/2022 Most recent T4: 0.95 taken on: 02/13/2021 Most recent T3: Unknown Assessed today?: No Drug: Levothyroxine 76m3m tab QD Pharmacist Assessment: Appropriate, Effective, Safe, Accessible . Preventative Health Care Gap: Colorectal cancer screening: Addressed Care Gap: Breast cancer screening: Addressed Care Gap: Annual Wellness Visit (AWV): Addressed Additional exercise counseling points. We discussed: incorporating flexibility, balance, and strength training exercises, decreasing sedentary behavior Additional diet counseling points. We discussed: key components of the DASH diet, key components of a low-carb eating plan, aiming to consume at least 8 cups of water day . Engagement Notes BeltMarda Stalker11/15/2023 09:54 PM CPP OV: 43mi223mP Doc: 15min37mchart prep: 46 min. . Clinical Summary Next CCM Follow Up: 6 months Next AWV: 08/19/2022 Next PCP Visit: 08/19/2022 . Pharmacy Interventions Pharmacist Interventions discussed: Yes Started Therapy: Untreated medical condition, Preventative therapy Monitoring: Preventative health screenings Education: Lifestyle modifications  Tariana Moldovan Marda StalkermD Clinical Pharmacist [email or web address] [phone]

## 2022-05-20 NOTE — Pre-Procedure Instructions (Signed)
Surgical Instructions    Your procedure is scheduled on Thursday November 30th.  Report to [facility] Main Entrance "A" at 10:15 A.M., then check in with the Admitting office.  Call this number if you have problems the morning of surgery:  [phone]   If you have any questions prior to your surgery date call [phone]: Open Monday-Friday 8am-4pm    Remember:  Do not eat after midnight the night before your surgery  You may drink clear liquids until 09:15 AM the morning of your surgery.   Clear liquids allowed are: Water, Non-Citrus Juices (without pulp), Carbonated Beverages, Clear Tea, Black Coffee Only (NO MILK, CREAM OR POWDERED CREAMER of any kind), and Gatorade.    Take these medicines the morning of surgery with A SIP OF WATER  acetaminophen (TYLENOL)  buPROPion (WELLBUTRIN XL)  dexamethasone (DECADRON)  DULoxetine (CYMBALTA)  fenofibrate micronized (LOFIBRA)  levothyroxine (SYNTHROID)  omeprazole (PRILOSEC)  oxybutynin (DITROPAN)  rOPINIRole (REQUIP)    As needed: albuterol (VENTOLIN HFA)- if needed, bring with you on day of surgery benzonatate (TESSALON)  carboxymethylcellulose (REFRESH PLUS)  gabapentin (NEURONTIN)  meclizine (ANTIVERT)  traMADol (ULTRAM)     Follow your surgeon's instructions on hydroxychloroquine (PLAQUENIL).  If no instructions were given by your surgeon then you will need to call the office to get those instructions.      As of today, STOP taking any Aspirin (unless otherwise instructed by your surgeon) Aleve, Naproxen, Ibuprofen, Motrin, Advil, Goody's, BC's, all herbal medications, fish oil, and all vitamins. This includes diclofenac Sodium (VOLTAREN) 1 % GEL.                     Do NOT Smoke (Tobacco/Vaping) for 24 hours prior to your procedure.  If you use a CPAP at night, you may bring your mask/headgear for your overnight stay.   Contacts, glasses, piercing's, hearing aid's, dentures or partials may not be worn into surgery,  please bring cases for these belongings.    For patients admitted to the hospital, discharge time will be determined by your treatment team.   Patients discharged the day of surgery will not be allowed to drive home, and someone needs to stay with them for 24 hours.  SURGICAL WAITING ROOM VISITATION Patients having surgery or a procedure may have no more than 2 support people in the waiting area - these visitors may rotate.   Children under the age of 41 must have an adult with them who is not the patient. If the patient needs to stay at the hospital during part of their recovery, the visitor guidelines for inpatient rooms apply. Pre-op nurse will coordinate an appropriate time for 1 support person to accompany patient in pre-op.  This support person may not rotate.   Please refer to the [facility] website for the visitor guidelines for Inpatients (after your surgery is over and you are in a regular room).    Special instructions:   [facility]- Preparing For Surgery  Before surgery, you can play an important role. Because skin is not sterile, your skin needs to be as free of germs as possible. You can reduce the number of germs on your skin by washing with CHG (chlorahexidine gluconate) Soap before surgery.  CHG is an antiseptic cleaner which kills germs and bonds with the skin to continue killing germs even after washing.    Oral Hygiene is also important to reduce your risk of infection.  Remember - BRUSH YOUR TEETH THE MORNING OF  SURGERY WITH YOUR REGULAR TOOTHPASTE  Please do not use if you have an allergy to CHG or antibacterial soaps. If your skin becomes reddened/irritated stop using the CHG.  Do not shave (including legs and underarms) for at least 48 hours prior to first CHG shower. It is OK to shave your face.  Please follow these instructions carefully.   Shower the NIGHT BEFORE SURGERY and the MORNING OF SURGERY  If you chose to wash your hair, wash your hair first as usual  with your normal shampoo.  After you shampoo, rinse your hair and body thoroughly to remove the shampoo.  Use CHG Soap as you would any other liquid soap. You can apply CHG directly to the skin and wash gently with a scrungie or a clean washcloth.   Apply the CHG Soap to your body ONLY FROM THE NECK DOWN.  Do not use on open wounds or open sores. Avoid contact with your eyes, ears, mouth and genitals (private parts). Wash Face and genitals (private parts)  with your normal soap.   Wash thoroughly, paying special attention to the area where your surgery will be performed.  Thoroughly rinse your body with warm water from the neck down.  DO NOT shower/wash with your normal soap after using and rinsing off the CHG Soap.  Pat yourself dry with a CLEAN TOWEL.  Wear CLEAN PAJAMAS to bed the night before surgery  Place CLEAN SHEETS on your bed the night before your surgery  DO NOT SLEEP WITH PETS.   Day of Surgery: Take a shower with CHG soap. Do not wear jewelry or makeup Do not wear lotions, powders, perfumes, or deodorant. Do not shave 48 hours prior to surgery.   Do not bring valuables to the hospital. [facility] is not responsible for any belongings or valuables. Do not wear nail polish, gel polish, artificial nails, or any other type of covering on natural nails (fingers and toes) If you have artificial nails or gel coating that need to be removed by a nail salon, please have this removed prior to surgery. Artificial nails or gel coating may interfere with anesthesia's ability to adequately monitor your vital signs. Wear Clean/Comfortable clothing the morning of surgery Remember to brush your teeth WITH YOUR REGULAR TOOTHPASTE.   Please read over the following fact sheets that you were given.    If you received a COVID test during your pre-op visit  it is requested that you wear a mask when out in public, stay away from anyone that may not be feeling well and notify your surgeon  if you develop symptoms. If you have been in contact with anyone that has tested positive in the last 10 days please notify you surgeon.

## 2022-05-21 ENCOUNTER — Encounter (HOSPITAL_COMMUNITY)
Admission: RE | Admit: 2022-05-21 | Discharge: 2022-05-21 | Disposition: A | Discharge status: Home / Self Care | Payer: Medicare PPO | Source: Ambulatory Visit | Attending: Otolaryngology | Admitting: Otolaryngology

## 2022-05-21 ENCOUNTER — Other Ambulatory Visit: Payer: Self-pay

## 2022-05-21 ENCOUNTER — Encounter (HOSPITAL_COMMUNITY): Payer: Self-pay

## 2022-05-21 DIAGNOSIS — Z01818 Encounter for other preprocedural examination: Secondary | ICD-10-CM | POA: Diagnosis not present

## 2022-05-21 NOTE — Progress Notes (Signed)
PCP - Dr.William Melford Aase Cardiologist - pt denies Pulmonologist- Dr.Vineet Sood  PPM/ICD - pt denies Device Orders - n/a Rep Notified - n/a  Chest x-ray - n/a EKG - 05/12/22 Stress Test - pt denies ECHO - 02/28/22 Cardiac Cath - pt denies  Sleep Study - yes, +OSA CPAP - pt does not use  Patient denies Diabetes.   Last dose of GLP1 agonist-  pt denies GLP1 instructions: n/a  Blood Thinner Instructions:pt denies Aspirin Instructions:pt denies  ERAS Protcol -yes PRE-SURGERY Ensure or G2- not ordered  COVID TEST- n/a   Anesthesia review: NO  Patient denies shortness of breath, fever, cough and chest pain at PAT appointment   All instructions explained to the patient, with a verbal understanding of the material. Patient agrees to go over the instructions while at home for a better understanding. Patient also instructed to self quarantine after being tested for COVID-19. The opportunity to ask questions was provided.

## 2022-05-26 ENCOUNTER — Encounter: Payer: Self-pay | Admitting: Gastroenterology

## 2022-05-27 DIAGNOSIS — G4733 Obstructive sleep apnea (adult) (pediatric): Secondary | ICD-10-CM | POA: Diagnosis not present

## 2022-05-29 ENCOUNTER — Ambulatory Visit (HOSPITAL_COMMUNITY): Payer: Medicare PPO

## 2022-05-29 ENCOUNTER — Ambulatory Visit (HOSPITAL_BASED_OUTPATIENT_CLINIC_OR_DEPARTMENT_OTHER): Payer: Medicare PPO | Admitting: Anesthesiology

## 2022-05-29 ENCOUNTER — Ambulatory Visit (HOSPITAL_COMMUNITY)
Admission: RE | Admit: 2022-05-29 | Discharge: 2022-05-29 | Disposition: A | Discharge status: Home / Self Care | Payer: Medicare PPO | Attending: Otolaryngology | Admitting: Otolaryngology

## 2022-05-29 ENCOUNTER — Encounter (HOSPITAL_COMMUNITY)
Admission: RE | Disposition: A | Discharge status: Home / Self Care | Payer: Self-pay | Source: Home / Self Care | Attending: Otolaryngology

## 2022-05-29 ENCOUNTER — Ambulatory Visit (HOSPITAL_COMMUNITY): Payer: Medicare PPO | Admitting: Anesthesiology

## 2022-05-29 ENCOUNTER — Encounter (HOSPITAL_COMMUNITY): Payer: Self-pay | Admitting: Otolaryngology

## 2022-05-29 ENCOUNTER — Other Ambulatory Visit: Payer: Self-pay

## 2022-05-29 DIAGNOSIS — Z6823 Body mass index (BMI) 23.0-23.9, adult: Secondary | ICD-10-CM | POA: Diagnosis not present

## 2022-05-29 DIAGNOSIS — Z9889 Other specified postprocedural states: Secondary | ICD-10-CM | POA: Diagnosis not present

## 2022-05-29 DIAGNOSIS — E119 Type 2 diabetes mellitus without complications: Secondary | ICD-10-CM | POA: Diagnosis not present

## 2022-05-29 DIAGNOSIS — G4733 Obstructive sleep apnea (adult) (pediatric): Secondary | ICD-10-CM | POA: Insufficient documentation

## 2022-05-29 DIAGNOSIS — Z9682 Presence of neurostimulator: Secondary | ICD-10-CM | POA: Diagnosis not present

## 2022-05-29 DIAGNOSIS — M1990 Unspecified osteoarthritis, unspecified site: Secondary | ICD-10-CM | POA: Insufficient documentation

## 2022-05-29 DIAGNOSIS — E039 Hypothyroidism, unspecified: Secondary | ICD-10-CM | POA: Insufficient documentation

## 2022-05-29 DIAGNOSIS — M797 Fibromyalgia: Secondary | ICD-10-CM | POA: Diagnosis not present

## 2022-05-29 DIAGNOSIS — E1169 Type 2 diabetes mellitus with other specified complication: Secondary | ICD-10-CM | POA: Diagnosis not present

## 2022-05-29 DIAGNOSIS — I10 Essential (primary) hypertension: Secondary | ICD-10-CM | POA: Diagnosis not present

## 2022-05-29 DIAGNOSIS — Z7722 Contact with and (suspected) exposure to environmental tobacco smoke (acute) (chronic): Secondary | ICD-10-CM | POA: Diagnosis not present

## 2022-05-29 DIAGNOSIS — E785 Hyperlipidemia, unspecified: Secondary | ICD-10-CM | POA: Diagnosis not present

## 2022-05-29 DIAGNOSIS — J988 Other specified respiratory disorders: Secondary | ICD-10-CM | POA: Diagnosis not present

## 2022-05-29 DIAGNOSIS — Z0389 Encounter for observation for other suspected diseases and conditions ruled out: Secondary | ICD-10-CM | POA: Diagnosis not present

## 2022-05-29 DIAGNOSIS — N182 Chronic kidney disease, stage 2 (mild): Secondary | ICD-10-CM | POA: Diagnosis not present

## 2022-05-29 HISTORY — PX: IMPLANTATION OF HYPOGLOSSAL NERVE STIMULATOR: SHX6827

## 2022-05-29 SURGERY — INSERTION, HYPOGLOSSAL NERVE STIMULATOR
Anesthesia: General | Site: Neck | Laterality: Right

## 2022-05-29 MED ORDER — OXYCODONE HCL 5 MG PO TABS
ORAL_TABLET | ORAL | Status: AC
  Filled 2022-05-29: qty 1

## 2022-05-29 MED ORDER — PROPOFOL 10 MG/ML IV BOLUS
INTRAVENOUS | Status: DC | PRN
  Administered 2022-05-29: 120 mg via INTRAVENOUS
  Administered 2022-05-29: 30 mg via INTRAVENOUS

## 2022-05-29 MED ORDER — DEXAMETHASONE SODIUM PHOSPHATE 10 MG/ML IJ SOLN
INTRAMUSCULAR | Status: AC
  Filled 2022-05-29: qty 1

## 2022-05-29 MED ORDER — SUCCINYLCHOLINE CHLORIDE 200 MG/10ML IV SOSY
PREFILLED_SYRINGE | INTRAVENOUS | Status: AC
  Filled 2022-05-29: qty 10

## 2022-05-29 MED ORDER — FENTANYL CITRATE (PF) 250 MCG/5ML IJ SOLN
INTRAMUSCULAR | Status: AC
  Filled 2022-05-29: qty 5

## 2022-05-29 MED ORDER — PROPOFOL 10 MG/ML IV BOLUS
INTRAVENOUS | Status: AC
  Filled 2022-05-29: qty 20

## 2022-05-29 MED ORDER — DEXAMETHASONE SODIUM PHOSPHATE 10 MG/ML IJ SOLN
INTRAMUSCULAR | Status: DC | PRN
  Administered 2022-05-29: 8 mg via INTRAVENOUS

## 2022-05-29 MED ORDER — LIDOCAINE-EPINEPHRINE 1 %-1:100000 IJ SOLN
INTRAMUSCULAR | Status: AC
  Filled 2022-05-29: qty 1

## 2022-05-29 MED ORDER — FENTANYL CITRATE (PF) 250 MCG/5ML IJ SOLN
INTRAMUSCULAR | Status: DC | PRN
  Administered 2022-05-29: 50 ug via INTRAVENOUS
  Administered 2022-05-29: 25 ug via INTRAVENOUS
  Administered 2022-05-29: 50 ug via INTRAVENOUS
  Administered 2022-05-29: 25 ug via INTRAVENOUS

## 2022-05-29 MED ORDER — 0.9 % SODIUM CHLORIDE (POUR BTL) OPTIME
TOPICAL | Status: DC | PRN
  Administered 2022-05-29: 1000 mL

## 2022-05-29 MED ORDER — ONDANSETRON 4 MG PO TBDP: 4.0000 mg | ORAL_TABLET | Freq: Three times a day (TID) | ORAL | 0 refills | Status: DC | PRN

## 2022-05-29 MED ORDER — ORAL CARE MOUTH RINSE: 15.0000 mL | Freq: Once | OROMUCOSAL | Status: DC

## 2022-05-29 MED ORDER — CHLORHEXIDINE GLUCONATE 0.12 % MT SOLN: 15.0000 mL | Freq: Once | OROMUCOSAL | Status: DC

## 2022-05-29 MED ORDER — FENTANYL CITRATE (PF) 100 MCG/2ML IJ SOLN: 25.0000 ug | INTRAMUSCULAR | Status: DC | PRN

## 2022-05-29 MED ORDER — HYDROCODONE-ACETAMINOPHEN 5-325 MG PO TABS: ORAL_TABLET | ORAL | 0 refills | Status: DC

## 2022-05-29 MED ORDER — ACETAMINOPHEN 10 MG/ML IV SOLN: 1000.0000 mg | Freq: Once | INTRAVENOUS | Status: DC | PRN

## 2022-05-29 MED ORDER — LIDOCAINE 2% (20 MG/ML) 5 ML SYRINGE
INTRAMUSCULAR | Status: DC | PRN
  Administered 2022-05-29: 100 mg via INTRAVENOUS

## 2022-05-29 MED ORDER — CHLORHEXIDINE GLUCONATE 0.12 % MT SOLN
OROMUCOSAL | Status: AC
  Filled 2022-05-29: qty 15

## 2022-05-29 MED ORDER — LACTATED RINGERS IV SOLN: INTRAVENOUS | Status: DC

## 2022-05-29 MED ORDER — STERILE WATER FOR IRRIGATION IR SOLN
Status: DC | PRN
  Administered 2022-05-29: 1000 mL

## 2022-05-29 MED ORDER — PHENYLEPHRINE 80 MCG/ML (10ML) SYRINGE FOR IV PUSH (FOR BLOOD PRESSURE SUPPORT)
PREFILLED_SYRINGE | INTRAVENOUS | Status: DC | PRN
  Administered 2022-05-29: 40 ug via INTRAVENOUS

## 2022-05-29 MED ORDER — HYDROCODONE-ACETAMINOPHEN 5-325 MG PO TABS
1.0000 | ORAL_TABLET | Freq: Once | ORAL | Status: AC
  Administered 2022-05-29: 1 via ORAL

## 2022-05-29 MED ORDER — CEFAZOLIN SODIUM-DEXTROSE 2-4 GM/100ML-% IV SOLN
2.0000 g | INTRAVENOUS | Status: AC
  Administered 2022-05-29: 2 g via INTRAVENOUS

## 2022-05-29 MED ORDER — LIDOCAINE-EPINEPHRINE 1 %-1:100000 IJ SOLN
INTRAMUSCULAR | Status: DC | PRN
  Administered 2022-05-29: 5 mL

## 2022-05-29 MED ORDER — SUCCINYLCHOLINE CHLORIDE 200 MG/10ML IV SOSY
PREFILLED_SYRINGE | INTRAVENOUS | Status: DC | PRN
  Administered 2022-05-29: 100 mg via INTRAVENOUS

## 2022-05-29 MED ORDER — LIDOCAINE 2% (20 MG/ML) 5 ML SYRINGE
INTRAMUSCULAR | Status: AC
  Filled 2022-05-29: qty 5

## 2022-05-29 MED ORDER — ONDANSETRON HCL 4 MG/2ML IJ SOLN
INTRAMUSCULAR | Status: AC
  Filled 2022-05-29: qty 2

## 2022-05-29 MED ORDER — PHENYLEPHRINE HCL-NACL 20-0.9 MG/250ML-% IV SOLN
INTRAVENOUS | Status: DC | PRN
  Administered 2022-05-29: 20 ug/min via INTRAVENOUS

## 2022-05-29 MED ORDER — ONDANSETRON HCL 4 MG/2ML IJ SOLN
INTRAMUSCULAR | Status: DC | PRN
  Administered 2022-05-29: 4 mg via INTRAVENOUS

## 2022-05-29 MED ORDER — ONDANSETRON HCL 4 MG/2ML IJ SOLN: 4.0000 mg | Freq: Once | INTRAMUSCULAR | Status: DC | PRN

## 2022-05-29 MED ORDER — HYDROCODONE-ACETAMINOPHEN 5-325 MG PO TABS
ORAL_TABLET | ORAL | Status: AC
  Filled 2022-05-29: qty 1

## 2022-05-29 MED ORDER — CEFAZOLIN SODIUM-DEXTROSE 2-4 GM/100ML-% IV SOLN
INTRAVENOUS | Status: AC
  Filled 2022-05-29: qty 100

## 2022-05-29 SURGICAL SUPPLY — 74 items
ACC NRSTM 4 TRQ WRNCH STRL (MISCELLANEOUS)
ADH SKN CLS APL DERMABOND .7 (GAUZE/BANDAGES/DRESSINGS) ×2
BAG COUNTER SPONGE SURGICOUNT (BAG) ×2 IMPLANT
BAG DECANTER FOR FLEXI CONT (MISCELLANEOUS) ×2 IMPLANT
BAG SPNG CNTER NS LX DISP (BAG) ×1
BLADE CLIPPER SURG (BLADE) IMPLANT
BLADE SURG 15 STRL LF DISP TIS (BLADE) ×6 IMPLANT
BLADE SURG 15 STRL SS (BLADE)
CANISTER SUCT 3000ML PPV (MISCELLANEOUS) ×2 IMPLANT
CORD BIPOLAR FORCEPS 12FT (ELECTRODE) ×2 IMPLANT
COVER PROBE W GEL 5X96 (DRAPES) ×2 IMPLANT
COVER SURGICAL LIGHT HANDLE (MISCELLANEOUS) ×2 IMPLANT
DERMABOND ADVANCED .7 DNX12 (GAUZE/BANDAGES/DRESSINGS) ×4 IMPLANT
DRAPE C-ARM 35X43 STRL (DRAPES) ×2 IMPLANT
DRAPE HEAD BAR (DRAPES) ×2 IMPLANT
DRAPE INCISE IOBAN 66X45 STRL (DRAPES) ×2 IMPLANT
DRAPE MICROSCOPE LEICA 54X105 (DRAPES) ×2 IMPLANT
DRAPE UTILITY XL STRL (DRAPES) ×2 IMPLANT
DRSG TEGADERM 2-3/8X2-3/4 SM (GAUZE/BANDAGES/DRESSINGS) ×4 IMPLANT
ELECT COATED BLADE 2.86 ST (ELECTRODE) ×2 IMPLANT
ELECT EMG 18 NIMS (NEUROSURGERY SUPPLIES) ×1
ELECTRODE EMG 18 NIMS (NEUROSURGERY SUPPLIES) ×2 IMPLANT
FORCEPS BIPOLAR SPETZLER 8 1.0 (NEUROSURGERY SUPPLIES) ×2 IMPLANT
GAUZE 4X4 16PLY ~~LOC~~+RFID DBL (SPONGE) ×2 IMPLANT
GAUZE SPONGE 4X4 12PLY STRL (GAUZE/BANDAGES/DRESSINGS) ×2 IMPLANT
GENERATOR PULSE INSPIRE (Generator) ×1 IMPLANT
GENERATOR PULSE INSPIRE IV (Generator) ×2 IMPLANT
GLOVE BIO SURGEON STRL SZ 6.5 (GLOVE) IMPLANT
GLOVE BIOGEL M 7.0 STRL (GLOVE) ×2 IMPLANT
GLOVE BIOGEL PI IND STRL 6.5 (GLOVE) IMPLANT
GLOVE BIOGEL PI IND STRL 7.0 (GLOVE) IMPLANT
GLOVE BIOGEL PI MICRO STRL 6 (GLOVE) IMPLANT
GOWN STRL REUS W/ TWL LRG LVL3 (GOWN DISPOSABLE) ×4 IMPLANT
GOWN STRL REUS W/TWL LRG LVL3 (GOWN DISPOSABLE) ×3
KIT BASIN OR (CUSTOM PROCEDURE TRAY) ×2 IMPLANT
KIT NEURO ACCESSORY W/WRENCH (MISCELLANEOUS) IMPLANT
KIT TURNOVER KIT B (KITS) ×2 IMPLANT
LEAD SENSING RESP INSPIRE (Lead) ×1 IMPLANT
LEAD SENSING RESP INSPIRE IV (Lead) ×2 IMPLANT
LEAD SLEEP STIM INSPIRE IV/V (Lead) ×2 IMPLANT
LEAD SLEEP STIMULATION INSPIRE (Lead) ×1 IMPLANT
LOOP VESSEL MAXI BLUE (MISCELLANEOUS) ×2 IMPLANT
LOOP VESSEL MINI RED (MISCELLANEOUS) ×2 IMPLANT
MARKER SKIN DUAL TIP RULER LAB (MISCELLANEOUS) ×4 IMPLANT
NDL HYPO 25GX1X1/2 BEV (NEEDLE) ×2 IMPLANT
NEEDLE HYPO 25GX1X1/2 BEV (NEEDLE) ×1 IMPLANT
NS IRRIG 1000ML POUR BTL (IV SOLUTION) ×2 IMPLANT
PAD ARMBOARD 7.5X6 YLW CONV (MISCELLANEOUS) ×2 IMPLANT
PASSER CATH 38CM DISP (INSTRUMENTS) ×2 IMPLANT
PENCIL SMOKE EVACUATOR (MISCELLANEOUS) ×2 IMPLANT
POSITIONER HEAD DONUT 9IN (MISCELLANEOUS) ×2 IMPLANT
PROBE NERVE STIMULATOR (NEUROSURGERY SUPPLIES) ×2 IMPLANT
REMOTE CONTROL SLEEP INSPIRE (MISCELLANEOUS) ×2 IMPLANT
SET WALTER ACTIVATION W/DRAPE (SET/KITS/TRAYS/PACK) ×2 IMPLANT
SLING ARM FOAM STRAP LRG (SOFTGOODS) IMPLANT
SLING ARM FOAM STRAP MED (SOFTGOODS) IMPLANT
SPONGE INTESTINAL PEANUT (DISPOSABLE) ×2 IMPLANT
STAPLER VISISTAT 35W (STAPLE) ×2 IMPLANT
SUT SILK 2 0 SH (SUTURE) ×4 IMPLANT
SUT SILK 3 0 RB1 (SUTURE) IMPLANT
SUT SILK 3 0 REEL (SUTURE) ×2 IMPLANT
SUT SILK 3 0 SH 30 (SUTURE) IMPLANT
SUT SILK 3-0 (SUTURE) ×2
SUT SILK 3-0 RB1 30XBRD (SUTURE) ×2
SUT VIC AB 4-0 PS2 27 (SUTURE) ×4 IMPLANT
SUT VIC AB 5-0 P-3 18XBRD (SUTURE) ×4 IMPLANT
SUT VIC AB 5-0 P3 18 (SUTURE) ×1
SUT VICRYL 5 0 PS 3 18 (SUTURE) IMPLANT
SUTURE SILK 3-0 RB1 30XBRD (SUTURE) ×4 IMPLANT
SYR 10ML LL (SYRINGE) ×2 IMPLANT
TAPE CLOTH SURG 4X10 WHT LF (GAUZE/BANDAGES/DRESSINGS) IMPLANT
TOWEL GREEN STERILE (TOWEL DISPOSABLE) ×2 IMPLANT
TRAY ENT MC OR (CUSTOM PROCEDURE TRAY) ×2 IMPLANT
TUBE CONNECTING 12X1/4 (SUCTIONS) IMPLANT

## 2022-05-29 NOTE — Transfer of Care (Signed)
Immediate Anesthesia Transfer of Care Note  Patient: Crystal [name]  Procedure(s) Performed: IMPLANTATION OF HYPOGLOSSAL NERVE STIMULATOR (Right: Neck)  Patient Location: PACU  Anesthesia Type:General  Level of Consciousness: awake and alert   Airway & Oxygen Therapy: Patient Spontanous Breathing and Patient connected to face mask oxygen  Post-op Assessment: Report given to RN and Post -op Vital signs reviewed and stable  Post vital signs: Reviewed and stable  Last Vitals:  Vitals Value Taken Time  BP 124/69 05/29/22 1501  Temp 36.9 C 05/29/22 1500  Pulse 83 05/29/22 1508  Resp 14 05/29/22 1508  SpO2 94 % 05/29/22 1508  Vitals shown include unvalidated device data.  Last Pain:  Vitals:   05/29/22 1500  TempSrc:   PainSc: 0-No pain         Complications: No notable events documented.

## 2022-05-29 NOTE — Anesthesia Procedure Notes (Signed)
Procedure Name: Intubation Date/Time: 05/29/2022 12:32 PM  Performed by: Erick Colace, CRNAPre-anesthesia Checklist: Patient identified, Emergency Drugs available, Suction available and Patient being monitored Patient Re-evaluated:Patient Re-evaluated prior to induction Oxygen Delivery Method: Circle system utilized Preoxygenation: Pre-oxygenation with 100% oxygen Induction Type: IV induction Ventilation: Mask ventilation without difficulty Laryngoscope Size: Mac and 4 Grade View: Grade I Tube type: Oral Tube size: 7.0 mm Number of attempts: 1 Airway Equipment and Method: Stylet and Oral airway Placement Confirmation: ETT inserted through vocal cords under direct vision, positive ETCO2 and breath sounds checked- equal and bilateral Secured at: 22 cm Tube secured with: Tape Dental Injury: Teeth and Oropharynx as per pre-operative assessment

## 2022-05-29 NOTE — Op Note (Signed)
Operative Note: INSPIRE IMPLANT  Patient: Crystal [name]  Medical record number: [id number]  Date:05/29/2022  Pre-operative Indications: Moderate/severe obstructive sleep apnea with positive airway pressure intolerance  Postoperative Indications: Same  Surgical Procedure:  1.  12th cranial nerve (hypoglossal) stimulation implant    2.  Placement of chest wall respiratory sensor    3.  Electronic analysis of implanted neurostimulator with pulse generator system  Anesthesia: GET  Surgeon: Delsa Bern, M.D.  Assist: RNFA  BMI: 23.9 kg/m  Signs and symptoms of the patient's obstructive sleep apnea include poor sleep quality, daytime fatigue and hypersomnolence and inability to tolerate CPAP.  Indications for hypoglossal nerve stimulator are significant obstructive sleep apnea with inability to tolerate medical treatment.  Complications: None  EBL: Less than 50 cc   Brief History: The patient is a 71 y.o. female with a history of moderate/severe obstructive sleep apnea.  The patient has undergone work-up including sleep study and trial of CPAP which they did not tolerate.  Patient is unable to use long-term CPAP for control of obstructive sleep apnea.  Patient underwent DISE which showed anterior to posterior airway obstruction, considered a good candidate for Inspire Implant. Given the patient's history and findings, I recommended Inspire Implant under general anesthesia, risks and benefits were discussed in detail with the patient and family. They understand and agree with our plan for surgery which is scheduled at [facility] on an elective basis.  Surgical Procedure: The patient is brought to the operating room on 05/29/2022 and placed in supine position on the operating table. General endotracheal anesthesia was established without difficulty. When the patient was adequately anesthetized, surgical timeout was performed and correct identification of the patient and the  surgical procedure. The patient was injected with 4 cc of 1% lidocaine 1:100,000 dilution epinephrine in subcutaneous fashion in the proposed skin incisions.  The patient was positioned and prepped and draped in sterile fashion.  The NIMS monitoring system was positioned and electrodes were placed in the anterior floor of mouth and tongue to assess the genioglossus and styloglossus muscle groups.  Nerve monitoring was used throughout the surgical procedure.  The procedure was begun by creating a modified right submandibular incision in the upper anterior lateral neck ~2 cm below the mandible and in a natural skin crease.  The incision was carried through the skin and underlying subcutaneous tissue to the level of the platysma.  Platysma muscle was divided and subplatysmal flaps were elevated superiorly and inferiorly.  The submandibular space was entered and the submandibular gland was identified and retracted posteriorly.  The digastric tendon was identified and dissection was carried out anterior and posterior along the tendon and muscle bellies.  The mylohyoid muscle was identified and retracted anteriorly and the hypoglossal nerve was carefully dissected across the anterior floor of the submandibular space.  Lateral branches to the retrusor muscles were identified and tested intraoperatively using the NIMS stimulator.  Stimulation electrode cuff for the hypoglossal nerve stimulator was placed distal to these branches on the medial hypoglossal nerve branch innervating the genioglossus muscle.  Diagnostic evaluation confirmed activation of the genioglossus muscle, resulting in genioglossal activation and tongue protrusion which was visually confirmed in the operating room.  The stimulation electrode was  sutured to the digastric tendon with interrupted 4-0 silk sutures.  A second second incision was created in the anterior upper right chest wall overlying the second rib interspace.  Incision was carried  through the skin and underlying subcutaneous tissue to the level of  the pectoralis major muscle.  The IPG pocket was created deep to the subcutaneous layer and superficial to the pectoralis muscle.  Placement of the stimulation lead was then undertaken by gentle dissection through the pectoralis major muscle parallel to the muscle fibers overlying the second rib interspace.  The subpectoral fat plane was then divided bluntly and the medial border of the external intercostal muscle was identified.  1 cm posterior to the medial border, dissection was carried through the external intercostal and a plane was developed in the second rib interspace.  The sensor lead was then tunneled into the second rib interspace and sutured with 3-0 silk suture to secure it in proper orientation with the sensing probe facing the pleural space.  The pectoralis major muscle dissection was then brought back into its normal anatomic position and the sense lead was brought out into the subcutaneous pocket.  The lead for the stimulating electrode was then tunneled in a subplatysmal fashion from the submandibular incision to the anterior chest wall incision.  The stimulating electrode and respiration sensing lead were connected to the implantable pulse generator.  Diagnostic evaluation was run confirming respiratory signal and good tongue protrusion on stimulation.  The implantable pulse generator was then placed in the subclavicular pocket and sutured to the pectoralis fascia with 2-0 silk sutures sutures.  The incisions were thoroughly irrigated with bacitracin saline irrigation.  The incisions were then closed in multiple layers with 4-0 and 5-0 Vicryl interrupted sutures in the deep and superficial subcutaneous levels at each incision site.  Dermabond surgical glue was used for skin closure.  The patient's incisions were dressed with rolled gauze and Hypafix tape for pressure dressing.    An orogastric tube was passed and stomach  contents were aspirated. Patient was awakened from anesthetic and transferred from the operating room to the recovery room in stable condition. There were no complications and blood loss was minimal.  X-rays were obtained in the recovery room to assess the proper location of the pulse generator, sensor lead and stimulation lead.   Delsa Bern, M.D. [facility] 05/29/2022

## 2022-05-29 NOTE — Anesthesia Postprocedure Evaluation (Signed)
Anesthesia Post Note  Patient: Crystal [name]  Procedure(s) Performed: IMPLANTATION OF HYPOGLOSSAL NERVE STIMULATOR (Right: Neck)     Patient location during evaluation: PACU Anesthesia Type: General Level of consciousness: awake and alert Pain management: pain level controlled Vital Signs Assessment: post-procedure vital signs reviewed and stable Respiratory status: spontaneous breathing, nonlabored ventilation, respiratory function stable and patient connected to nasal cannula oxygen Cardiovascular status: blood pressure returned to baseline and stable Postop Assessment: no apparent nausea or vomiting Anesthetic complications: no  No notable events documented.  Last Vitals:  Vitals:   05/29/22 1500 05/29/22 1515  BP: 124/69 122/75  Pulse: 85 85  Resp: 15 17  Temp: 36.9 C   SpO2: 100% 94%    Last Pain:  Vitals:   05/29/22 1500  TempSrc:   PainSc: 0-No pain                 Keymari Sato S

## 2022-05-29 NOTE — Anesthesia Preprocedure Evaluation (Signed)
Anesthesia Evaluation  Patient identified by MRN, date of birth, ID band Patient awake    Reviewed: Allergy & Precautions, H&P , NPO status , Patient's Chart, lab work & pertinent test results  Airway Mallampati: II  TM Distance: >3 FB Neck ROM: Full    Dental no notable dental hx.    Pulmonary sleep apnea    Pulmonary exam normal breath sounds clear to auscultation       Cardiovascular hypertension, Pt. on medications Normal cardiovascular exam Rhythm:Regular Rate:Normal     Neuro/Psych TIA negative psych ROS   GI/Hepatic negative GI ROS, Neg liver ROS,,,  Endo/Other  diabetesHypothyroidism    Renal/GU negative Renal ROS  negative genitourinary   Musculoskeletal  (+) Arthritis , Osteoarthritis,  Fibromyalgia -  Abdominal   Peds negative pediatric ROS (+)  Hematology negative hematology ROS (+)   Anesthesia Other Findings   Reproductive/Obstetrics negative OB ROS                             Anesthesia Physical Anesthesia Plan  ASA: 3  Anesthesia Plan: General   Post-op Pain Management: Minimal or no pain anticipated   Induction: Intravenous  PONV Risk Score and Plan: 3 and Ondansetron, Dexamethasone and Treatment may vary due to age or medical condition  Airway Management Planned: Oral ETT  Additional Equipment:   Intra-op Plan:   Post-operative Plan: Extubation in OR  Informed Consent: I have reviewed the patients History and Physical, chart, labs and discussed the procedure including the risks, benefits and alternatives for the proposed anesthesia with the patient or authorized representative who has indicated his/her understanding and acceptance.     Dental advisory given  Plan Discussed with: CRNA and Surgeon  Anesthesia Plan Comments:        Anesthesia Quick Evaluation

## 2022-05-29 NOTE — H&P (Signed)
Crystal [name] is an 71 y.o. female.   Chief Complaint: Obstructive  Sleep Apnea HPI: Hx of OSA, unable to tolerate CPAP  Past Medical History:  Diagnosis Date   Arthritis    ASCVD (arteriosclerotic cardiovascular disease)    Closed nondisplaced subtrochanteric fracture of left femur ([place]) 09/03/2017   September 2018.  Status post surgery by Dr. Marcelino Scot   Depression    Dyspnea    Fibromyalgia    Gait disorder 10/26/2012   GERD (gastroesophageal reflux disease)    GI bleed    Hyperlipidemia    Hypertension    Hypothyroid    Periprosthetic supracondylar fracture of femur, left  03/23/2017   Pneumonia    Pre-diabetes    Restless leg syndrome    Rhinitis 06/25/2010   Sleep apnea    does not use CPAP   Spinal headache    with the C section   TIA (transient ischemic attack)    3         Vitamin D deficiency     Past Surgical History:  Procedure Laterality Date   ABDOMINAL HYSTERECTOMY  1992   ANTERIOR CERVICAL DECOMPRESSION/DISCECTOMY FUSION 4 LEVELS N/A 06/14/2015   Procedure: Cervical three-four Cervical four-five Cervical five- six Cervical six- seven  Anterior cervical decompression/diskectomy/fusion;  Surgeon: Erline Levine, MD;  Location: [place] [facility];  Service: Neurosurgery;  Laterality: N/A;  C3-4 C4-5 C5-6 C6-7 Anterior cervical decompression/diskectomy/fusion   APPENDECTOMY  1960   BACK SURGERY     CATARACT EXTRACTION, BILATERAL     CESAREAN SECTION  1983 & 1886   DRUG INDUCED ENDOSCOPY N/A 02/25/2022   Procedure: DRUG INDUCED SLEEP ENDOSCOPY;  Surgeon: Jerrell Belfast, MD;  Location: [place];  Service: ENT;  Laterality: N/A;   EYE SURGERY     bilateral cataracts   JOINT REPLACEMENT     2   LEFT  1 RIGHT    KNEE SURGERY     ORIF FEMUR FRACTURE Left 03/24/2017   Procedure: OPEN REDUCTION INTERNAL FIXATION (ORIF) FEMUR FRACTURE;  Surgeon: Altamese Morovis, MD;  Location: [facility];  Service: Orthopedics;  Laterality: Left;   ROTATOR CUFF  REPAIR Left 2005   2 LEFT  1  RIGHT   SHOULDER ADHESION RELEASE     SPINE SURGERY     TOE SURGERY     LEFT        TOE SURGERY     RIGHT  BIG TOE   TONSILLECTOMY  1960   TRANSFORAMINAL LUMBAR INTERBODY FUSION W/ MIS 1 LEVEL Left 09/05/2021   Procedure: MINIMALLY INVASIVE TRANSFORAMINAL LUMBAR INTERBODY FUSION LUMBAR THREE-FOUR; EXPLORE FUSION WITH REVISION OF HARDWARE LUMBAR FOUR-FIVE,LEFT SIDE APPROACH;  Surgeon: Karsten Ro, DO;  Location: [place] [facility];  Service: Neurosurgery;  Laterality: Left;    Family History  Problem Relation Age of Onset   Leukemia Sister    Lupus Sister    Depression Sister    Rheum arthritis Sister    Heart disease Father    Hypertension Father    Hyperlipidemia Father    Neuropathy Father    Cancer Mother    Diabetes Mother    Osteoporosis Mother    Breast cancer Mother    Migraines Daughter    Social History:  reports that she has never smoked. She has been exposed to tobacco smoke. She has never used smokeless tobacco. She reports current alcohol use of about 2.0 standard drinks of alcohol per week. She reports that she does not use drugs.  Allergies:  Allergies  Allergen Reactions   Methocarbamol Shortness Of Breath, Nausea Only and Other (See Comments)    Other reaction(s): Dizziness, psychological reaction, disoriented Sleepy and weight loss    Codeine Other (See Comments)    Headache   Statins Other (See Comments)    Extreme pain   Other Other (See Comments)    MINT=sore mouth and stomach upset   Oxycodone     Dizziness, unbalanced.   Amitriptyline Other (See Comments)    Confusion and hallucinations   Erythromycin Base Rash    Flushing/red face.   Fish Oil Nausea Only   Flax Seed [Flax Seed Oil] Rash   Flaxseed (Linseed) Rash   Hydrocodone Itching and Rash        Morphine Nausea And Vomiting and Rash   Nsaids Nausea Only   Nuvigil [Armodafinil] Other (See Comments)    Unspecified   Sertraline Rash   Sinemet [Carbidopa  W-Levodopa] Rash   Sulfa Antibiotics Rash   Tolmetin Nausea Only    Medications Prior to Admission  Medication Sig Dispense Refill   acetaminophen (TYLENOL) 500 MG tablet Take 1,000 mg by mouth in the morning, at noon, and at bedtime.     benzonatate (TESSALON) 200 MG capsule TAKE 1 CAP THREE TIMES DAILY AS NEEDED FOR COUGH. 30 capsule 1   Black Pepper-Turmeric (TURMERIC PLUS BLACK PEPPER EXT PO) Take by mouth.     buPROPion (WELLBUTRIN XL) 150 MG 24 hr tablet TAKE 1 TABLET DAILY FOR MOOD, FOCUS & CONCENTRATION 90 tablet 3   carboxymethylcellulose (REFRESH PLUS) 0.5 % SOLN Place 1 drop into both eyes 3 (three) times daily as needed (dry eyes).     CINNAMON PO Take by mouth.     clobetasol ointment (TEMOVATE) 2.83 % Apply 1 Application topically 3 (three) times daily as needed (lichen planus).     clotrimazole (MYCELEX) 10 MG troche Take 10 mg by mouth 3 (three) times daily.     dexamethasone (DECADRON) 0.5 MG/5ML solution Take 0.5 mg by mouth 3 (three) times daily.     docusate sodium (COLACE) 100 MG capsule Take 100 mg by mouth daily as needed for mild constipation or moderate constipation.     DULoxetine (CYMBALTA) 60 MG capsule TAKE 1 CAPSULE DAILY FOR CHRONIC PAIN 90 capsule 3   fenofibrate micronized (LOFIBRA) 134 MG capsule TAKE 1 CAPSULE BY MOUTH DAILY BEFORE BREAKFAST. 90 capsule 0   Ferrous Sulfate (IRON PO) Take 25 mg by mouth daily.     fluticasone (FLONASE) 50 MCG/ACT nasal spray USE TWO SPRAYS IN EACH NOSTRIL DAILY AS NEEDED (Patient taking differently: Place 2 sprays into both nostrils at bedtime.) 48 g 3   gabapentin (NEURONTIN) 300 MG capsule Take 1 capsule 3 x /day for Chronic Pain         ( Dx: m79.7)                                               /                                TAKE                                   BY  MOUTH (Patient taking differently: Take 300 mg by mouth 4 (four) times daily as needed (pain).                                             /                                TAKE                                   BY                                  MOUTH) 270 capsule 3   guaiFENesin (MUCUS RELIEF ADULT PO) Take 1 capsule by mouth daily.     hydroxychloroquine (PLAQUENIL) 200 MG tablet Take 200 mg by mouth 2 (two) times daily.     levothyroxine (SYNTHROID) 88 MCG tablet Take  1 tablet  Daily  on an empty stomach with only water for 30 minutes & no Antacid meds, Calcium or Magnesium for 4 hours & avoid Biotin 90 tablet 3   meclizine (ANTIVERT) 25 MG tablet TAKE 1 TABLET (25 MG TOTAL) BY MOUTH 3 (THREE) TIMES DAILY AS NEEDED FOR DIZZINESS. 90 tablet 0   mupirocin ointment (BACTROBAN) 2 % Apply 1 Application topically 3 (three) times daily as needed (lichen planus).     neomycin-polymyxin-hydrocortisone (CORTISPORIN) OTIC solution Place 3 drops into both ears daily as needed (ear inflammation).     omeprazole (PRILOSEC) 40 MG capsule TAKE 1 CAPSULE (40 MG TOTAL) BY MOUTH DAILY. 90 capsule 1   OVER THE COUNTER MEDICATION Place 1 drop under the tongue daily. D3 5000 units - K2 120 mcg     oxybutynin (DITROPAN) 5 MG tablet Take 5 mg by mouth daily.     polyethylene glycol (MIRALAX / GLYCOLAX) 17 g packet Take 17-34 g by mouth at bedtime.     rOPINIRole (REQUIP) 2 MG tablet Take 1-2 tablets (2-4 mg total) by mouth See admin instructions. Take 2 mg AM, 2 mg at dinner time, and 4 mg  at bedtime. (Patient taking differently: Take 2 mg by mouth in the morning, at noon, and at bedtime.) 120 tablet 3   tiZANidine (ZANAFLEX) 2 MG tablet Take 2 mg by mouth at bedtime.     traMADol (ULTRAM) 50 MG tablet Take 50 mg by mouth every 6 (six) hours as needed for moderate pain or severe pain.     albuterol (VENTOLIN HFA) 108 (90 Base) MCG/ACT inhaler Inhale 2 puffs into the lungs every 6 (six) hours as needed for wheezing or shortness of breath. 8 g 2   diclofenac Sodium (VOLTAREN) 1 % GEL Apply 2 g topically daily as needed (pain).      No results  found. However, due to the size of the patient record, not all encounters were searched. Please check Results Review for a complete set of results. No results found.  Review of Systems  Respiratory:  Positive for apnea.     Blood pressure (!) 155/91, pulse 88, temperature 98 F (36.7 C), temperature source Oral, resp. rate 18, height 5' 3.5" (1.613 m), weight 62.1 kg, SpO2 100 %. Physical Exam  Constitutional:      Appearance: She is normal weight.  Cardiovascular:     Rate and Rhythm: Normal rate.  Pulmonary:     Effort: Pulmonary effort is normal.  Musculoskeletal:     Cervical back: Normal range of motion.  Neurological:     Mental Status: She is alert.      Assessment/Plan Adm for OP [employer] under [facility] as an OP  Jerrell Belfast, MD 05/29/2022, 11:55 AM

## 2022-05-30 ENCOUNTER — Encounter (HOSPITAL_COMMUNITY): Payer: Self-pay | Admitting: Otolaryngology

## 2022-06-04 ENCOUNTER — Encounter: Payer: Self-pay | Admitting: Internal Medicine

## 2022-06-15 ENCOUNTER — Other Ambulatory Visit: Payer: Self-pay | Admitting: Internal Medicine

## 2022-06-15 DIAGNOSIS — J479 Bronchiectasis, uncomplicated: Secondary | ICD-10-CM

## 2022-06-17 DIAGNOSIS — R531 Weakness: Secondary | ICD-10-CM | POA: Diagnosis not present

## 2022-06-17 DIAGNOSIS — M48062 Spinal stenosis, lumbar region with neurogenic claudication: Secondary | ICD-10-CM | POA: Diagnosis not present

## 2022-06-17 DIAGNOSIS — R260 Ataxic gait: Secondary | ICD-10-CM | POA: Diagnosis not present

## 2022-06-17 DIAGNOSIS — M79604 Pain in right leg: Secondary | ICD-10-CM | POA: Diagnosis not present

## 2022-06-19 DIAGNOSIS — R260 Ataxic gait: Secondary | ICD-10-CM | POA: Diagnosis not present

## 2022-06-19 DIAGNOSIS — M48062 Spinal stenosis, lumbar region with neurogenic claudication: Secondary | ICD-10-CM | POA: Diagnosis not present

## 2022-06-19 DIAGNOSIS — R531 Weakness: Secondary | ICD-10-CM | POA: Diagnosis not present

## 2022-06-19 DIAGNOSIS — M79604 Pain in right leg: Secondary | ICD-10-CM | POA: Diagnosis not present

## 2022-07-01 ENCOUNTER — Ambulatory Visit: Payer: Medicare PPO | Admitting: Nurse Practitioner

## 2022-07-03 DIAGNOSIS — L438 Other lichen planus: Secondary | ICD-10-CM | POA: Diagnosis not present

## 2022-07-03 DIAGNOSIS — Z79899 Other long term (current) drug therapy: Secondary | ICD-10-CM | POA: Diagnosis not present

## 2022-07-07 ENCOUNTER — Other Ambulatory Visit: Payer: Self-pay | Admitting: Internal Medicine

## 2022-07-08 ENCOUNTER — Other Ambulatory Visit: Payer: Medicare PPO

## 2022-07-08 ENCOUNTER — Encounter: Payer: Self-pay | Admitting: Gastroenterology

## 2022-07-08 ENCOUNTER — Ambulatory Visit (INDEPENDENT_AMBULATORY_CARE_PROVIDER_SITE_OTHER): Payer: Medicare PPO | Admitting: Gastroenterology

## 2022-07-08 VITALS — BP 130/80 | HR 91 | Ht 63.0 in | Wt 141.0 lb

## 2022-07-08 DIAGNOSIS — K219 Gastro-esophageal reflux disease without esophagitis: Secondary | ICD-10-CM | POA: Diagnosis not present

## 2022-07-08 DIAGNOSIS — L439 Lichen planus, unspecified: Secondary | ICD-10-CM

## 2022-07-08 DIAGNOSIS — K5909 Other constipation: Secondary | ICD-10-CM

## 2022-07-08 DIAGNOSIS — K5000 Crohn's disease of small intestine without complications: Secondary | ICD-10-CM

## 2022-07-08 DIAGNOSIS — Z79899 Other long term (current) drug therapy: Secondary | ICD-10-CM | POA: Diagnosis not present

## 2022-07-08 DIAGNOSIS — H00011 Hordeolum externum right upper eyelid: Secondary | ICD-10-CM | POA: Diagnosis not present

## 2022-07-08 MED ORDER — LINACLOTIDE 145 MCG PO CAPS: 145.0000 ug | ORAL_CAPSULE | Freq: Every day | ORAL | 0 refills | Status: DC

## 2022-07-08 NOTE — Progress Notes (Signed)
HPI :  73 year old female with history of Crohn's disease of the ileum, history of TIA, hypertension, hyperlipidemia, referred here by Dr. [name] to establish her GI care.  She previously followed with Dr. Kinnie Scales for her Crohn's disease, he has since retired.  History obtained from the patient and reviewing Dr. Jennye Boroughs prior records.  It looks like she was formally diagnosed with Crohn's ileitis back in 2019 or so, however she has had ileitis noted on colonoscopies dating back for years before that however biopsies never confirm the diagnosis.  When asked about the symptoms that she has from her Crohn's disease, she reports chronic constipation.  She states that is the main issue that is been bothering her.  She takes MiraLAX once daily, sometimes twice for the symptoms as well as Colace.  She denies any blood in her stools.  She has occasional abdominal pains that bother her.  She is not too satisfied with her MiraLAX and has some constipation despite taking that a few times daily.  Looks like she was on Humira for a few years after her initial diagnosis.  From review of the record it was a question of she was having multiple falls due to demyelination?  However there was continued despite that.  She had multiple orthopedic surgeries in 2021 2022, she had an infection from a knee replacement, had been off Humira for several months and then ultimately just stopped it based on description of what she reports.  She has been off Humira now for a few years.  She has not been on any other medication for her Crohn's disease.  She reports that historically she had been on a significant amount of NSAIDs during prior colonoscopies and that was thought to be the cause of her ileitis.  She states she tested negative for celiac disease and she has had iron deficiency in the past.  She has never had a surgery for Crohn's disease or hospitalization from this.  She reports being followed by [facility] dermatology for  lichen planus in her mouth.  She previously was on hydroxychloroquine for this however was just transition to azathioprine last week which is a new medicine for her.  It appears she is taking 100 mg/day.  She otherwise has a history of reflux for which she takes omeprazole.  She has some belching and gas that can bother her at times, no significant heartburn, rare dysphagia perhaps to pills sometimes.  She has occasional rare nausea but no vomiting.  She has chronic pain in her back and joints.  She previously was on narcotics but now is off all narcotics.  She takes Cymbalta 60 mg daily, Ultram as needed but states she does not use it that frequently.  She is also on gabapentin.  She denies using any NSAIDs routinely.  She has been using CBD Gummies to sleep and turmeric as needed as well.  She denies any family history of colon cancer or Crohn's disease.  Her last colonoscopy was in 2019, she had ileitis with 1 small adenoma.  Her last imaging was with an MRI in 2016.  She has had labs in November showing normal ESR, normal CRP, normal CBC with no anemia or leukocytosis.   Colonoscopy 2019 - finding of chronic active ileitis with ulceration and a tubular adenoma.   Review of her medical chart, found earlier ulcerative inflammation of the terminal ileum, at colonoscopy 2010 and 2014. Biospies confirmed active inflammation, but granulomas have not been found. Small bowel series  confirmed ileal inflammation, consistent with Crohn's enteritis of the terminal ileum.   MRE 07/2014: IMPRESSION:   1. Long segment of bowel wall thickening, mucosal enhancement and luminal narrowing involving 20 cm of the distal ileum. No evidence of bowel obstruction or proximal dilatation. No fecalization. The inflammation is present on comparison exam but the narrowing has progressed. 2. No fisutala or abscess.    Echocardiogram 02/28/2022 - EF 55-60%,    Past Medical History:  Diagnosis Date   Arthritis     ASCVD (arteriosclerotic cardiovascular disease)    Closed nondisplaced subtrochanteric fracture of left femur (HCC) 09/03/2017   September 2018.  Status post surgery by Dr. Carola Frost   Crohn's disease of ileum without complication (HCC)    DDD (degenerative disc disease), lumbar    Depression    Dyspnea    Eye infection    Fibromyalgia    Gait disorder 10/26/2012   GERD (gastroesophageal reflux disease)    GI bleed    Hyperlipidemia    Hypertension    Hypothyroid    Periprosthetic supracondylar fracture of femur, left  03/23/2017   Pneumonia    Pre-diabetes    Restless leg syndrome    Rhinitis 06/25/2010   Sleep apnea    does not use CPAP   Spinal headache    with the C section   TIA (transient ischemic attack)    3         Vitamin D deficiency      Past Surgical History:  Procedure Laterality Date   ABDOMINAL HYSTERECTOMY  1992   ANTERIOR CERVICAL DECOMPRESSION/DISCECTOMY FUSION 4 LEVELS N/A 06/14/2015   Procedure: Cervical three-four Cervical four-five Cervical five- six Cervical six- seven  Anterior cervical decompression/diskectomy/fusion;  Surgeon: Maeola Harman, MD;  Location: [facility];  Service: Neurosurgery;  Laterality: N/A;  C3-4 C4-5 C5-6 C6-7 Anterior cervical decompression/diskectomy/fusion   APPENDECTOMY  1960   BACK SURGERY     CATARACT EXTRACTION, BILATERAL     CESAREAN SECTION  1983 & 1886   DRUG INDUCED ENDOSCOPY N/A 02/25/2022   Procedure: DRUG INDUCED SLEEP ENDOSCOPY;  Surgeon: Osborn Coho, MD;  Location:  SURGERY CENTER;  Service: ENT;  Laterality: N/A;   EYE SURGERY     bilateral cataracts   FINGER ARTHROSCOPY     IMPLANTATION OF HYPOGLOSSAL NERVE STIMULATOR Right 05/29/2022   Procedure: IMPLANTATION OF HYPOGLOSSAL NERVE STIMULATOR;  Surgeon: Osborn Coho, MD;  Location: [facility] OR;  Service: ENT;  Laterality: Right;   JOINT REPLACEMENT     2   LEFT  1 RIGHT    KNEE SURGERY     ORIF FEMUR FRACTURE Left 03/24/2017   Procedure: OPEN  REDUCTION INTERNAL FIXATION (ORIF) FEMUR FRACTURE;  Surgeon: Myrene Galas, MD;  Location: [facility] OR;  Service: Orthopedics;  Laterality: Left;   ROTATOR CUFF REPAIR Left 2005   2 LEFT  1  RIGHT   SHOULDER ADHESION RELEASE     SPINAL CORD DECOMPRESSION     SPINE SURGERY     TOE SURGERY     LEFT        TOE SURGERY     RIGHT  BIG TOE   TONSILLECTOMY  1960   TRANSFORAMINAL LUMBAR INTERBODY FUSION W/ MIS 1 LEVEL Left 09/05/2021   Procedure: MINIMALLY INVASIVE TRANSFORAMINAL LUMBAR INTERBODY FUSION LUMBAR THREE-FOUR; EXPLORE FUSION WITH REVISION OF HARDWARE LUMBAR FOUR-FIVE,LEFT SIDE APPROACH;  Surgeon: Bethann Goo, DO;  Location: [facility] OR;  Service: Neurosurgery;  Laterality: Left;   Family History  Problem  Relation Age of Onset   Cancer Mother    Diabetes Mother    Osteoporosis Mother    Breast cancer Mother    Arthritis Mother    Heart disease Father    Hypertension Father    Hyperlipidemia Father    Neuropathy Father    Leukemia Sister    Lupus Sister    Depression Sister    Rheum arthritis Sister    Migraines Daughter    Liver disease Neg Hx    Colon cancer Neg Hx    Esophageal cancer Neg Hx    Social History   Tobacco Use   Smoking status: Never    Passive exposure: Past   Smokeless tobacco: Never  Vaping Use   Vaping Use: Never used  Substance Use Topics   Alcohol use: Yes    Alcohol/week: 2.0 standard drinks of alcohol    Types: 2 Standard drinks or equivalent per week    Comment: social   Drug use: No   Current Outpatient Medications  Medication Sig Dispense Refill   acetaminophen (TYLENOL) 500 MG tablet Take 1,000 mg by mouth in the morning, at noon, and at bedtime.     benzonatate (TESSALON) 200 MG capsule Take  1  perle  3 x /day ONLY if needed for Severe Cough 30 capsule 0   buPROPion (WELLBUTRIN XL) 150 MG 24 hr tablet TAKE 1 TABLET DAILY FOR MOOD, FOCUS & CONCENTRATION 90 tablet 3   CINNAMON PO Take by mouth.     clobetasol ointment (TEMOVATE) 0.05 %  Apply 1 Application topically 3 (three) times daily as needed (lichen planus).     clotrimazole (MYCELEX) 10 MG troche Take 10 mg by mouth 3 (three) times daily.     dexamethasone (DECADRON) 0.5 MG/5ML solution Take 0.5 mg by mouth 3 (three) times daily.     diclofenac Sodium (VOLTAREN) 1 % GEL Apply 2 g topically daily as needed (pain).     docusate sodium (COLACE) 100 MG capsule Take 100 mg by mouth daily as needed for mild constipation or moderate constipation.     DULoxetine (CYMBALTA) 60 MG capsule TAKE 1 CAPSULE DAILY FOR CHRONIC PAIN 90 capsule 3   fluticasone (FLONASE) 50 MCG/ACT nasal spray USE TWO SPRAYS IN EACH NOSTRIL DAILY AS NEEDED (Patient taking differently: Place 2 sprays into both nostrils at bedtime.) 48 g 3   gabapentin (NEURONTIN) 300 MG capsule Take 1 capsule 3 x /day for Chronic Pain         ( Dx: m79.7)                                               /                                TAKE                                   BY                                  MOUTH (Patient taking differently: Take 300 mg by mouth 4 (four) times daily as needed (pain).                                            /  TAKE                                   BY                                  MOUTH) 270 capsule 3   guaiFENesin (MUCUS RELIEF ADULT PO) Take 1 capsule by mouth daily.     levothyroxine (SYNTHROID) 88 MCG tablet Take  1 tablet  Daily  on an empty stomach with only water for 30 minutes & no Antacid meds, Calcium or Magnesium for 4 hours & avoid Biotin 90 tablet 3   linaclotide (LINZESS) 145 MCG CAPS capsule Take 1 capsule (145 mcg total) by mouth daily before breakfast. Lot: [id number], exp: 12-2022 12 capsule 0   meclizine (ANTIVERT) 25 MG tablet TAKE 1 TABLET (25 MG TOTAL) BY MOUTH 3 (THREE) TIMES DAILY AS NEEDED FOR DIZZINESS. 90 tablet 0   mupirocin ointment (BACTROBAN) 2 % Apply 1 Application topically 3 (three) times daily as needed (lichen planus).      neomycin-polymyxin-hydrocortisone (CORTISPORIN) OTIC solution Place 3 drops into both ears daily as needed (ear inflammation).     omeprazole (PRILOSEC) 40 MG capsule TAKE 1 CAPSULE (40 MG TOTAL) BY MOUTH DAILY. 90 capsule 1   OVER THE COUNTER MEDICATION Place 1 drop under the tongue daily. D3 5000 units - K2 120 mcg     oxybutynin (DITROPAN) 5 MG tablet Take 5 mg by mouth daily.     polyethylene glycol (MIRALAX / GLYCOLAX) 17 g packet Take 17-34 g by mouth at bedtime.     rOPINIRole (REQUIP) 2 MG tablet Take 1-2 tablets (2-4 mg total) by mouth See admin instructions. Take 2 mg AM, 2 mg at dinner time, and 4 mg  at bedtime. (Patient taking differently: Take 2 mg by mouth in the morning, at noon, and at bedtime.) 120 tablet 3   tiZANidine (ZANAFLEX) 2 MG tablet Take 2 mg by mouth at bedtime.     traMADol (ULTRAM) 50 MG tablet Take 50 mg by mouth every 6 (six) hours as needed for moderate pain or severe pain.     Black Pepper-Turmeric (TURMERIC PLUS BLACK PEPPER EXT PO) Take by mouth. (Patient not taking: Reported on 07/08/2022)     carboxymethylcellulose (REFRESH PLUS) 0.5 % SOLN Place 1 drop into both eyes 3 (three) times daily as needed (dry eyes). (Patient not taking: Reported on 07/08/2022)     fenofibrate micronized (LOFIBRA) 134 MG capsule TAKE 1 CAPSULE BY MOUTH DAILY BEFORE BREAKFAST. 90 capsule 0   Ferrous Sulfate (IRON PO) Take 25 mg by mouth daily.     hydroxychloroquine (PLAQUENIL) 200 MG tablet Take 200 mg by mouth 2 (two) times daily.     No current facility-administered medications for this visit.   Allergies  Allergen Reactions   Methocarbamol Shortness Of Breath, Nausea Only and Other (See Comments)    Other reaction(s): Dizziness, psychological reaction, disoriented Sleepy and weight loss    Codeine Other (See Comments)    Headache   Statins Other (See Comments)    Extreme pain   Other Other (See Comments)    MINT=sore mouth and stomach upset   Oxycodone     Dizziness,  unbalanced.   Amitriptyline Other (See Comments)    Confusion and hallucinations   Erythromycin Base Rash    Flushing/red face.  Fish Oil Nausea Only   Flax Seed [Flax Seed Oil] Rash   Flaxseed (Linseed) Rash   Hydrocodone Itching and Rash        Morphine Nausea And Vomiting and Rash   Nsaids Nausea Only   Nuvigil [Armodafinil] Other (See Comments)    Unspecified   Sertraline Rash   Sinemet [Carbidopa W-Levodopa] Rash   Sulfa Antibiotics Rash   Tolmetin Nausea Only     Review of Systems: All systems reviewed and negative except where noted in HPI.   Lab Results  Component Value Date   WBC 6.2 05/12/2022   HGB 13.9 05/12/2022   HCT 41.1 05/12/2022   MCV 83.7 05/12/2022   PLT 309 05/12/2022    Lab Results  Component Value Date   CREATININE 1.01 (H) 05/12/2022   BUN 21 05/12/2022   NA 144 05/12/2022   K 4.2 05/12/2022   CL 104 05/12/2022   CO2 32 05/12/2022    Lab Results  Component Value Date   ALT 11 05/12/2022   AST 21 05/12/2022   ALKPHOS 58 01/19/2021   BILITOT 0.4 05/12/2022      Physical Exam: BP 130/80   Pulse 91   Ht 5\' 3"  (1.6 m)   Wt 141 lb (64 kg)   SpO2 95%   BMI 24.98 kg/m  Constitutional: Pleasant, female in no acute distress. HEENT: Normocephalic and atraumatic. Conjunctivae are normal. No scleral icterus. Neck supple.  Cardiovascular: Normal rate, regular rhythm.  Pulmonary/chest: Effort normal and breath sounds normal.  Abdominal: Soft, nondistended, nontender. There are no masses palpable.  Extremities: no edema Neurological: Alert and oriented to person place and time. Skin: Skin is warm and dry. No rashes noted. Psychiatric: Normal mood and affect. Behavior is normal.   ASSESSMENT: 72 y.o. female here for assessment of the following  1. Crohn's disease of ileum without complication ([place])   2. Chronic constipation   3. Lichen planus   4. High risk medication use   5. Gastroesophageal reflux disease, unspecified whether  esophagitis present    New patient to me, reviewed her history of ileal Crohn's disease.  Historically this seems to be mild without any prior hospitalizations or surgeries.  She has been on NSAIDs chronically in the past although on her colonoscopy in 2019 at time of diagnosis she states she was not on those.  The only regimen she has been on for this has been Humira which was stopped when she had numerous orthopedic surgeries and infections a few years ago and she has not resumed it.  She has not had any imaging of her bowel in some time.  Her main symptom is chronic constipation at this time and it is unclear whether her Crohn's is even causing any symptoms at this point.  Her recent labs show no anemia, normal inflammatory markers.    We discussed where to move forward from here.  I think we need to restage her Crohn's disease and see if it is active or not.  I think the best way to do this initially would be with an MR enterography, and also check a fecal calprotectin.  She is agreeable to both of these.  If she has significant inflammatory changes on her MRI then we will need to discuss other options to treat her Crohn's disease.  Of note, she has lichen planus followed by [facility] dermatology and recently started on azathioprine.  We discussed that this is also a treatment for Crohn's disease however not as effective  as other drugs.  If she has significantly active Crohn's disease we may need to touch base with you into dermatology and switch her regimen to something else perhaps a biologic that would cover both disease entities.  If her Crohn's is not active, then we would just continue management of her lichen planus per her dermatologist, if they would prefer azathioprine.  For her constipation, she is not too satisfied with MiraLAX at this time, offered her trial of Linzess samples, 145 mcg to give daily, she can use MiraLAX on top of this if needed.  If she finds it benefits her we will give her a  prescription for it.  Will await results of her MRI enterography and fecal calprotectin.  I counseled her to avoid the routine use of NSAIDs moving forward.  I will see her back in 3 months for reassessment, we will contact her in the interim with results.  We we will need to consider colonoscopy at some point, can discuss timing with her at her next visit.   PLAN: - avoid routine NSAIDs - use tylenol PRN for pains - schedule for MR enterography to restage Crohn's  - lab for fecal calprotectin - consider colonoscopy pending course - continue Azathioprine for now, per [facility] derm, but may consider other options if Crohn's is active - trial of Linzess samples - give script if works - continue Miralax PRN - f/u 3 months or sooner with issues  Harlin Rain, MD [facility]  CC: [name], MD

## 2022-07-08 NOTE — Patient Instructions (Addendum)
If you are age 74 or older, your body mass index should be between 23-30. Your Body mass index is 24.98 kg/m. If this is out of the aforementioned range listed, please consider follow up with your Primary Care Provider.  If you are age 7 or younger, your body mass index should be between 19-25. Your Body mass index is 24.98 kg/m. If this is out of the aformentioned range listed, please consider follow up with your Primary Care Provider.   ________________________________________________________   Dennis Bast have been scheduled for an MR Enterography of the Abdomin and Pelvis at ______________ on ______________. Your appointment time is ____________. Please arrive to admitting (at main entrance of the hospital) 1 hour and 30 minutes prior to your appointment time for registration purposes. Please make certain not to have anything to eat or drink 6 hours prior to your test. In addition, if you have any metal in your body, have a pacemaker or defibrillator, please be sure to let your ordering physician know. This test typically takes 45 minutes to 1 hour to complete. Should you need to reschedule, please call [phone] to do so.  ________________________________________  Please go to the lab in the basement of our building to have lab work done as you leave today. Hit "B" for basement when you get on the elevator.  When the doors open the lab is on your left.  We will call you with the results. Thank you.  Avoid all NSAIDs.  We have given you samples of the following medication to take: Linzess 145 mcg  Linzess works best when taken once a day every day, on an empty stomach, at least 30 minutes before your first meal of the day.  When Linzess is taken daily as directed:  *Constipation relief is typically felt in about a week *IBS-C patients may begin to experience relief from belly pain and overall abdominal symptoms (pain, discomfort, and bloating) in about 1 week,   with symptoms typically  improving over 12 weeks.  Diarrhea may occur in the first 2 weeks -keep taking it.  The diarrhea should go away and you should start having normal, complete, full bowel movements. It may be helpful to start treatment when you can be near the comfort of your own bathroom, such as a weekend.   ______________________________________________________________  Clemon Chambers.   Please follow up in 3 months, April. The schedule is not open yet.  Please call back next month to schedule: [phone].  Thank you for entrusting me with your care and for choosing [facility], Dr.  Cellar

## 2022-07-09 ENCOUNTER — Telehealth: Payer: Self-pay

## 2022-07-09 NOTE — Telephone Encounter (Signed)
Note: Crystal [name] (to send sure chat) if needed to switch order or advise device is activated.

## 2022-07-09 NOTE — Telephone Encounter (Signed)
Called and left message for patient to call back to discuss. 

## 2022-07-09 NOTE — Telephone Encounter (Signed)
[name] thank you very much, appreciate the follow up. If MR enterography can't be done or too much hassle regarding the Inspire device, could switch to CT enterography if needed. Thanks

## 2022-07-09 NOTE — Telephone Encounter (Signed)
regarding order for MR Enterography, Crystal [name] in scheduling called to inform us that since patient has an implanted Inspire device for sleep apnea it will have to be activated before they can schedule the MRI.  Once it is activated we need to let her know at [phone], and they will contact the manufacturer to discuss the scan and if it's possible.  IF it can't be longer than 30 minutes and the scan is an hour they may not be able to do it.  Will call patient to inform and ask for to let us know when it is activated.

## 2022-07-10 NOTE — Telephone Encounter (Signed)
Patient is returning your call.  

## 2022-07-10 NOTE — Telephone Encounter (Signed)
MyChart message sent to pt

## 2022-07-10 NOTE — Telephone Encounter (Signed)
Per MyChart message from patient, Crystal [name] device to be activated on 1-19. Patient would like to have scan prior to 2-21 when her husband is scheduled for knee surgery. Note: If MR Entero not advisable, Dr. Havery Moros indicates we can change order to CT Entero.

## 2022-07-15 ENCOUNTER — Other Ambulatory Visit: Payer: Medicare PPO

## 2022-07-15 DIAGNOSIS — Z79899 Other long term (current) drug therapy: Secondary | ICD-10-CM | POA: Diagnosis not present

## 2022-07-15 DIAGNOSIS — K5909 Other constipation: Secondary | ICD-10-CM | POA: Diagnosis not present

## 2022-07-15 DIAGNOSIS — K5000 Crohn's disease of small intestine without complications: Secondary | ICD-10-CM | POA: Diagnosis not present

## 2022-07-15 DIAGNOSIS — K219 Gastro-esophageal reflux disease without esophagitis: Secondary | ICD-10-CM | POA: Diagnosis not present

## 2022-07-15 DIAGNOSIS — L439 Lichen planus, unspecified: Secondary | ICD-10-CM

## 2022-07-15 NOTE — Telephone Encounter (Signed)
Per Radiology: we can not guarantee we will be able to get everything with the time restrictions. It may be best to change to Ct if they can get same information. They can always talk to a radiologist to see what they suggest. They can call [phone] and ask for a body imaging radiologist.

## 2022-07-15 NOTE — Telephone Encounter (Signed)
Called Radium. She indicates that Dawna Part said we can schedule MR Enterography once it is activated but she can only be scanned for 30 minutes. I requested that she confirm that a MR Entero Ab and Pelvis can be done in 30 minutes. We don't want to waste time or money and not get a reliable result. She indicates she will check with Radiology and get back to me.

## 2022-07-15 NOTE — Telephone Encounter (Signed)
Janett Billow from Radiology is calling in regards to the MRI that is supposed to be scheduled for patient. Requested call back

## 2022-07-15 NOTE — Telephone Encounter (Signed)
"  Vendor confirmed that there is not a waiting period required as long as the device is activated. The patient must bring their remote with them for the appointment. NPO 6 hours prior. She will have to drink oral contrast so will need to arrive 1.5 hrs prior to the appointment to drink the oral contrast prior to starting the exam. We can only scan for 30 minutes so we will try our best to get the entire exam done in that timeframe.

## 2022-07-15 NOTE — Telephone Encounter (Signed)
Per Dr. Havery Moros "CT enterography is fine if the MRI is proving to be too difficult with timing, etc. Thanks".

## 2022-07-16 ENCOUNTER — Ambulatory Visit: Payer: Medicare PPO | Admitting: Nurse Practitioner

## 2022-07-16 NOTE — Telephone Encounter (Signed)
Patient scheduled for 07-30-22 at 5pm

## 2022-07-18 ENCOUNTER — Encounter (HOSPITAL_BASED_OUTPATIENT_CLINIC_OR_DEPARTMENT_OTHER): Payer: Self-pay | Admitting: Pulmonary Disease

## 2022-07-18 ENCOUNTER — Encounter: Payer: Self-pay | Admitting: Gastroenterology

## 2022-07-18 ENCOUNTER — Ambulatory Visit (INDEPENDENT_AMBULATORY_CARE_PROVIDER_SITE_OTHER): Payer: Medicare PPO | Admitting: Pulmonary Disease

## 2022-07-18 ENCOUNTER — Encounter: Payer: Self-pay | Admitting: Pulmonary Disease

## 2022-07-18 VITALS — BP 124/72 | HR 90 | Temp 98.2°F | Ht 63.5 in | Wt 141.4 lb

## 2022-07-18 DIAGNOSIS — G4733 Obstructive sleep apnea (adult) (pediatric): Secondary | ICD-10-CM

## 2022-07-18 NOTE — Progress Notes (Signed)
[facility]  Chief Complaint  Patient presents with   Follow-up    Inspire Activation today.    Past Surgical History:  She  has a past surgical history that includes Shoulder adhesion release; Spine surgery; Appendectomy (1960); Cesarean section ([place]); Abdominal hysterectomy (1992); Rotator cuff repair (Left, 2005); Tonsillectomy (1960); Joint replacement; Toe Surgery; Toe Surgery; Anterior cervical decompression/discectomy fusion 4 level (N/A, 06/14/2015); ORIF femur fracture (Left, 03/24/2017); Knee surgery; Back surgery; Eye surgery; Transforaminal lumbar interbody fusion w/ mis 1 level (Left, 09/05/2021); Drug induced endoscopy (N/A, 02/25/2022); Cataract extraction, bilateral; Implantation of hypoglossal nerve stimulator (Right, 05/29/2022); Finger arthroscopy; and Spinal cord decompression.  Past Medical History:  Vit D deficiency, TIA, RLS, Hypothyroidism, HTN, HLD, GERD, Fibromyalgia, Depression, CAD, Crohn's disease, Bell's palsy  Constitutional:  BP 124/72 (BP Location: Left Arm, Patient Position: Sitting, Cuff Size: Normal)   Pulse 90   Temp 98.2 F (36.8 C) (Oral)   Ht 5' 3.5" (1.613 m)   Wt 141 lb 6.4 oz (64.1 kg)   SpO2 99%   BMI 24.65 kg/m   Brief Summary:  Crystal [name] is a 72 y.o. female with obstructive sleep apnea, and bronchiectasis in the setting of reflux.      Subjective:   She had Inspire implanted in November.  No issues with implant.  Here to start device activation.    Physical Exam:   Appearance - well kempt   ENMT - no sinus tenderness, no oral exudate, no LAN, Mallampati 2 airway, no stridor, right facial droop  Respiratory - equal breath sounds bilaterally, no wheezing or rales  CV - s1s2 regular rate and rhythm, no murmurs  Ext - no clubbing, no edema  Skin - no rashes  Psych - normal mood and affect   Pulmonary testing:  PFT 11/21/15 >> FEV1 2.51 (110%), FEV1% 85%,  TLC 4.36 (88%), DLCO 77% PFT 05/16/22 >> FEV1 2.26 (107%), FEV1% 82, TLC 5.73 (116%), DLCO 81%  Chest Imaging:  CT chest 10/01/17 >> 3 mm LLL nodule, mild cylindrical BTX Rt side  Sleep Tests:  HST 12/04/15 >> 30.7, SpO2 low 73% HST 11/06/21 >> AHI 36.5, SpO2 low 73%  Cardiac Tests:  Echo 02/28/22 >> EF 55 to 60%, mild MR  Social History:  She  reports that she has never smoked. She has been exposed to tobacco smoke. She has never used smokeless tobacco. She reports current alcohol use of about 2.0 standard drinks of alcohol per week. She reports that she does not use drugs.  Family History:  Her family history includes Arthritis in her mother; Breast cancer in her mother; Cancer in her mother; Depression in her sister; Diabetes in her mother; Heart disease in her father; Hyperlipidemia in her father; Hypertension in her father; Leukemia in her sister; Lupus in her sister; Migraines in her daughter; Neuropathy in her father; Osteoporosis in her mother; Rheum arthritis in her sister.     Assessment/Plan:   Obstructive sleep apnea. - intolerant of CPAP - had Inspire implanted in November 2023 - activation today - video visit follow up in 4 weeks to monitor tolerance - 10 week follow up in person to monitor therapy and then arrange for in lab sleep study   Regional bronchiectasis in setting of reflux. - prn antitussive and expectorants   Time Spent Involved in Patient Care on Day of Examination:  27 minutes  Follow up:   Patient Instructions  Video visit follow up in  4 weeks  Medication List:   Allergies as of 07/18/2022       Reactions   Methocarbamol Shortness Of Breath, Nausea Only, Other (See Comments)   Other reaction(s): Dizziness, psychological reaction, disoriented Sleepy and weight loss   Codeine Other (See Comments)   Headache   Statins Other (See Comments)   Extreme pain   Other Other (See Comments)   MINT=sore mouth and stomach upset   Oxycodone     Dizziness, unbalanced.   Amitriptyline Other (See Comments)   Confusion and hallucinations   Erythromycin Base Rash   Flushing/red face.   Fish Oil Nausea Only   Flax Seed [flax Seed Oil] Rash   Flaxseed (linseed) Rash   Hydrocodone Itching, Rash      Morphine Nausea And Vomiting, Rash   Nsaids Nausea Only   Nuvigil [armodafinil] Other (See Comments)   Unspecified   Sertraline Rash   Sinemet [carbidopa W-levodopa] Rash   Sulfa Antibiotics Rash   Tolmetin Nausea Only        Medication List        Accurate as of July 18, 2022  1:44 PM. If you have any questions, ask your nurse or doctor.          acetaminophen 500 MG tablet Commonly known as: TYLENOL Take 1,000 mg by mouth in the morning, at noon, and at bedtime.   benzonatate 200 MG capsule Commonly known as: TESSALON Take  1  perle  3 x /day ONLY if needed for Severe Cough   buPROPion 150 MG 24 hr tablet Commonly known as: WELLBUTRIN XL TAKE 1 TABLET DAILY FOR MOOD, FOCUS & CONCENTRATION   carboxymethylcellulose 0.5 % Soln Commonly known as: REFRESH PLUS Place 1 drop into both eyes 3 (three) times daily as needed (dry eyes).   CINNAMON PO Take by mouth.   clobetasol ointment 0.05 % Commonly known as: TEMOVATE Apply 1 Application topically 3 (three) times daily as needed (lichen planus).   clotrimazole 10 MG troche Commonly known as: MYCELEX Take 10 mg by mouth 3 (three) times daily.   dexamethasone 0.5 MG/5ML solution Commonly known as: DECADRON Take 0.5 mg by mouth 3 (three) times daily.   diclofenac Sodium 1 % Gel Commonly known as: VOLTAREN Apply 2 g topically daily as needed (pain).   docusate sodium 100 MG capsule Commonly known as: COLACE Take 100 mg by mouth daily as needed for mild constipation or moderate constipation.   DULoxetine 60 MG capsule Commonly known as: CYMBALTA TAKE 1 CAPSULE DAILY FOR CHRONIC PAIN   fenofibrate micronized 134 MG capsule Commonly known as:  LOFIBRA TAKE 1 CAPSULE BY MOUTH DAILY BEFORE BREAKFAST.   fluticasone 50 MCG/ACT nasal spray Commonly known as: FLONASE USE TWO SPRAYS IN EACH NOSTRIL DAILY AS NEEDED What changed:  how much to take how to take this when to take this additional instructions   gabapentin 300 MG capsule Commonly known as: NEURONTIN Take 1 capsule 3 x /day for Chronic Pain         ( Dx: m79.7)                                               /  TAKE                                   BY                                  MOUTH What changed:  how much to take how to take this when to take this reasons to take this additional instructions   hydroxychloroquine 200 MG tablet Commonly known as: PLAQUENIL Take 200 mg by mouth 2 (two) times daily.   IRON PO Take 25 mg by mouth daily.   levothyroxine 88 MCG tablet Commonly known as: Synthroid Take  1 tablet  Daily  on an empty stomach with only water for 30 minutes & no Antacid meds, Calcium or Magnesium for 4 hours & avoid Biotin   linaclotide 145 MCG Caps capsule Commonly known as: LINZESS Take 1 capsule (145 mcg total) by mouth daily before breakfast. Lot: [id number], exp: 12-2022   meclizine 25 MG tablet Commonly known as: ANTIVERT TAKE 1 TABLET (25 MG TOTAL) BY MOUTH 3 (THREE) TIMES DAILY AS NEEDED FOR DIZZINESS.   MUCUS RELIEF ADULT PO Take 1 capsule by mouth daily.   mupirocin ointment 2 % Commonly known as: BACTROBAN Apply 1 Application topically 3 (three) times daily as needed (lichen planus).   neomycin-polymyxin-hydrocortisone OTIC solution Commonly known as: CORTISPORIN Place 3 drops into both ears daily as needed (ear inflammation).   omeprazole 40 MG capsule Commonly known as: PRILOSEC TAKE 1 CAPSULE (40 MG TOTAL) BY MOUTH DAILY.   OVER THE COUNTER MEDICATION Place 1 drop under the tongue daily. D3 5000 units - K2 120 mcg   oxybutynin 5 MG tablet Commonly known as: DITROPAN Take 5 mg by mouth  daily.   polyethylene glycol 17 g packet Commonly known as: MIRALAX / GLYCOLAX Take 17-34 g by mouth at bedtime.   rOPINIRole 2 MG tablet Commonly known as: REQUIP Take 1-2 tablets (2-4 mg total) by mouth See admin instructions. Take 2 mg AM, 2 mg at dinner time, and 4 mg  at bedtime. What changed:  how much to take when to take this additional instructions   tiZANidine 2 MG tablet Commonly known as: ZANAFLEX Take 2 mg by mouth at bedtime.   tobramycin 0.3 % ophthalmic solution Commonly known as: TOBREX Place 1 drop into the right eye 4 (four) times daily.   traMADol 50 MG tablet Commonly known as: ULTRAM Take 50 mg by mouth every 6 (six) hours as needed for moderate pain or severe pain.   TURMERIC PLUS BLACK PEPPER EXT PO Take by mouth.        Signature:  Coralyn Helling, MD [facility]monary/Critical Care Pager - [phone] 07/18/2022, 1:44 PM

## 2022-07-18 NOTE — Patient Instructions (Signed)
Video visit follow up in 4 weeks

## 2022-07-19 LAB — CALPROTECTIN, FECAL: Calprotectin, Fecal: 1060 ug/g — ABNORMAL HIGH (ref 0–120)

## 2022-07-21 ENCOUNTER — Other Ambulatory Visit: Payer: Self-pay

## 2022-07-21 MED ORDER — LINACLOTIDE 145 MCG PO CAPS: 145.0000 ug | ORAL_CAPSULE | Freq: Every day | ORAL | 3 refills | Status: DC

## 2022-07-21 NOTE — Progress Notes (Signed)
Script for [employer] sent to [facility], per patient request

## 2022-07-22 ENCOUNTER — Ambulatory Visit (INDEPENDENT_AMBULATORY_CARE_PROVIDER_SITE_OTHER): Payer: Medicare PPO | Admitting: Internal Medicine

## 2022-07-22 ENCOUNTER — Encounter: Payer: Self-pay | Admitting: Internal Medicine

## 2022-07-22 VITALS — BP 128/70 | HR 94 | Temp 97.8°F | Resp 16 | Ht 63.5 in | Wt 141.4 lb

## 2022-07-22 DIAGNOSIS — M159 Polyosteoarthritis, unspecified: Secondary | ICD-10-CM

## 2022-07-22 NOTE — Progress Notes (Signed)
Future Appointments  Date Time Provider Department  07/22/2022  2:30 PM Unk Pinto, MD [facility]  08/19/2022                   wellness  2:30 PM Alycia Rossetti, NP [facility]  09/03/2022 11:00 AM [name], MD [facility]  05/18/2023                   cpe  2:00 PM Unk Pinto, MD [facility]    History of Present Illness:      This very nice 72 y.o. [name]  with HTN, HLD, diet controlled T2_DM, Hypothyroidism, OSA/BiPAP (hx/o mask intolerance) , Crohn's Colitis  and Vitamin D Deficiency - presents with c/o pain in her neck, hands, knees & feet.  Patient has hx/o Osteoarthritis.  Patient has hx/o Lumbar Spinal Stenosis.  In May 2023, Dr Burney Gauze - Hand surgeon recommended that she see  a rheumatologist for c/o hand /finger deformities.  Patient also  has a Chronic Pain Syndrome consequent of multiple Neck & back surgeries & is on Cymbalta/Gabapentin to avoid chronic Opioids.     Medications    dexamethasone (DECADRON) 0.5 MG/5ML solution, Take 0.5 mg by mouth 3 (three) times daily.   levothyroxine 88 MCG tablet, Take  1 tablet  Daily     fenofibrate micronized (LOFIBRA) 134 MG capsule, Take 1 capsule (134 mg total) by mouth daily before breakfast.   albuterol (VENTOLIN HFA) 108 (90 Base) MCG/ACT inhaler, Inhale 2 puffs into the lungs every 6 (six) hours as needed for wheezing or shortness of breath.   benzonatate (TESSALON) 200 MG capsule, TAKE 1 CAP THREE TIMES DAILY AS NEEDED FOR COUGH.   fluticasone (FLONASE) 50 MCG/ACT nasal spray, USE TWO SPRAYS IN EACH NOSTRIL DAILY AS NEEDED (Patient taking differently: Place 2 sprays into both nostrils at bedtime.)   guaiFENesin (MUCUS RELIEF ADULT PO), Take 1 capsule by mouth daily.   acetaminophen (TYLENOL) 500 MG tablet, Take 1,000 mg by mouth every 6 (six) hours as needed for moderate pain, mild pain or headache.   traMADol (ULTRAM) 50 MG tablet, Take 50 mg by mouth every 6 (six) hours as needed for moderate pain or severe pain.    Ferrous Sulfate (IRON PO), Take 25 mg by mouth daily. gentle iron   buPROPion (WELLBUTRIN XL) 150 MG 24 hr tablet, Take 150 mg by mouth daily.   carboxymethylcellulose (REFRESH ) 0.5 % SOLN, Place 1 drop into both eyes 3 (three) times daily as needed (dry eyes).   clobetasol ointment (TEMOVATE) 6.26 %, Apply 1 Application topically 3 (three) times daily.   clotrimazole (MYCELEX) 10 MG troche, Take 10 mg by mouth 3 (three) times daily.   diclofenac Sodium (VOLTAREN) 1 % GEL, Apply 2 g topically daily as needed (pain).   docusate sodium (COLACE) 100 MG capsule, Take 100 mg by mouth daily as needed for mild constipation or moderate constipation.   DULoxetine (CYMBALTA) 60 MG capsule, TAKE 1 CAPSULE DAILY FOR CHRONIC PAIN   gabapentin (NEURONTIN) 300 MG capsule, TAKE 1 CAPSULE BY MOUTH THREE TIMES A DAY (Patient taking differently: Take 400 mg by mouth 3 (three) times daily.)   hydroxychloroquine (PLAQUENIL) 200 MG tablet, Take 200 mg by mouth 2 (two) times daily.   meclizine (ANTIVERT) 25 MG tablet, TAKE 1 TABLET (25 MG TOTAL) BY MOUTH 3 (THREE) TIMES DAILY AS NEEDED FOR DIZZINESS.   mupirocin ointment (BACTROBAN) 2 %, Apply 1 Application topically 3 (three) times daily.  CORTISPORIN  OTIC solution, Place 3 drops into both ears daily as needed (ear inflammation).   omeprazole (PRILOSEC) 40 MG capsule, Take 40 mg by mouth daily.   Place 1 mL under the tongue daily. D3 5000 units K2 120 mcg   oxybutynin (DITROPAN) 5 MG tablet, Take 5 mg by mouth daily.   polyethylene glycol (MIRALAX / GLYCOLAX) 17 g packet, Take 17-34 g by mouth at bedtime. Dose depends on degree of constipation   rOPINIRole (REQUIP) 2 MG tablet, TAKE 1 TABLET FOUR TIMES A DAY FOR RESTLESS LEGS (Patient taking differently: Take 2-4 mg by mouth See admin instructions. Take 2 mg AM, 2 mg at dinner time, and 4 mg  at bedtime.)   tiZANidine (ZANAFLEX) 2 MG tablet, Take 2 mg by mouth at bedtime.  Problem list She has Obstructive sleep  apnea; RESTLESS LEG SYNDROME; Essential hypertension; Rhinitis; GERD; Crohn's Enterocolitis / Regional enteritis; Fibromyalgia; Gait disorder; Hyperlipidemia associated with type 2 diabetes mellitus ([place]); Hypothyroid; Depression, major, recurrent, in partial remission ([place]); Vitamin D deficiency; ASCVD; Medication management; Rotator cuff arthropathy; Obesity (BMI 30.0-34.9); Spinal stenosis in cervical region; IBS (irritable colon syndrome); Dyspnea on exertion; Sleep talking; B12 deficiency anemia; Polypharmacy; Bronchiectasis without complication ([place]); Arthritis of carpometacarpal (CMC) joints of both thumbs; Bilateral bunions; Cervical disc disease; Crohn's ileitis ([place]); Rotator cuff tear; Diverticulosis; History of adenomatous polyp of colon; Internal hemorrhoids; Knee pain; Other specified arthropathy involving shoulder region; S/P revision of total knee, left; Diet-controlled type 2 diabetes mellitus ([place]); Rupture of popliteal cyst of right knee region (complex cyst); History of recurrent TIAs; COVID-19 virus infection; CKD stage 2 due to type 2 diabetes mellitus ([place]); History of total knee arthroplasty, right; Pain due to total right knee replacement ([place]); Presence of artificial knee joint, right; Aortic atherosclerosis ([place]) by Chest CT scan on 09/2017; Lumbar spinal stenosis; and Dizziness on their problem list.   Observations/Objective:  BP 128/70   Pulse 94   Temp 97.8 F (36.6 C)   Resp 16   Ht 5' 3.5" (1.613 m)   Wt 141 lb 6.4 oz (64.1 kg)   SpO2 99%   BMI 24.65 kg/m   HEENT - WNL. Neck - supple.  Chest - Clear equal BS. Cor - Nl HS. RRR w/o sig MGR. PP 2 (+). No edema. MS- FROM .  Gait Nl.  Bilateral Heberden's & Bouchard's nodes  of most finger joints  with moderate lateral splaying.  Neuro -  Nl w/o focal abnormalities. Skin - Dependent rubor of toes & distal feet.   Assessment and Plan:   1. Primary osteoarthritis involving multiple joints  Refer to Rheumatology for  work-up & treatment    Follow Up Instructions:        I discussed the assessment and treatment plan with the patient. The patient was provided an opportunity to ask questions and all were answered. The patient agreed with the plan and demonstrated an understanding of the instructions.    Kirtland Bouchard, MD

## 2022-07-23 ENCOUNTER — Other Ambulatory Visit: Payer: Self-pay

## 2022-07-23 ENCOUNTER — Other Ambulatory Visit: Payer: Self-pay | Admitting: Internal Medicine

## 2022-07-23 DIAGNOSIS — M797 Fibromyalgia: Secondary | ICD-10-CM

## 2022-07-23 DIAGNOSIS — G510 Bell's palsy: Secondary | ICD-10-CM | POA: Diagnosis not present

## 2022-07-23 DIAGNOSIS — R1314 Dysphagia, pharyngoesophageal phase: Secondary | ICD-10-CM | POA: Insufficient documentation

## 2022-07-23 DIAGNOSIS — E039 Hypothyroidism, unspecified: Secondary | ICD-10-CM

## 2022-07-23 DIAGNOSIS — G4733 Obstructive sleep apnea (adult) (pediatric): Secondary | ICD-10-CM | POA: Diagnosis not present

## 2022-07-23 MED ORDER — GABAPENTIN 300 MG PO CAPS: ORAL_CAPSULE | ORAL | 3 refills | Status: DC

## 2022-07-23 MED ORDER — DULOXETINE HCL 60 MG PO CPEP: ORAL_CAPSULE | ORAL | 3 refills | Status: DC

## 2022-07-23 MED ORDER — LEVOTHYROXINE SODIUM 88 MCG PO TABS: ORAL_TABLET | ORAL | 3 refills | Status: DC

## 2022-07-23 MED ORDER — OMEPRAZOLE 40 MG PO CPDR: 40.0000 mg | DELAYED_RELEASE_CAPSULE | Freq: Every day | ORAL | 1 refills | Status: DC

## 2022-07-24 ENCOUNTER — Other Ambulatory Visit: Payer: Self-pay | Admitting: Otolaryngology

## 2022-07-24 DIAGNOSIS — R1314 Dysphagia, pharyngoesophageal phase: Secondary | ICD-10-CM

## 2022-07-25 ENCOUNTER — Other Ambulatory Visit: Payer: Self-pay | Admitting: Internal Medicine

## 2022-07-25 DIAGNOSIS — J479 Bronchiectasis, uncomplicated: Secondary | ICD-10-CM

## 2022-07-28 ENCOUNTER — Encounter: Payer: Self-pay | Admitting: Gastroenterology

## 2022-07-30 ENCOUNTER — Ambulatory Visit (HOSPITAL_BASED_OUTPATIENT_CLINIC_OR_DEPARTMENT_OTHER)
Admission: RE | Admit: 2022-07-30 | Discharge: 2022-07-30 | Disposition: A | Discharge status: Home / Self Care | Payer: Medicare PPO | Source: Ambulatory Visit | Attending: Gastroenterology | Admitting: Gastroenterology

## 2022-07-30 DIAGNOSIS — Z79899 Other long term (current) drug therapy: Secondary | ICD-10-CM

## 2022-07-30 DIAGNOSIS — K5909 Other constipation: Secondary | ICD-10-CM

## 2022-07-30 DIAGNOSIS — K6389 Other specified diseases of intestine: Secondary | ICD-10-CM | POA: Diagnosis not present

## 2022-07-30 DIAGNOSIS — L439 Lichen planus, unspecified: Secondary | ICD-10-CM

## 2022-07-30 DIAGNOSIS — K7689 Other specified diseases of liver: Secondary | ICD-10-CM | POA: Diagnosis not present

## 2022-07-30 DIAGNOSIS — K5000 Crohn's disease of small intestine without complications: Secondary | ICD-10-CM

## 2022-07-30 DIAGNOSIS — K219 Gastro-esophageal reflux disease without esophagitis: Secondary | ICD-10-CM

## 2022-07-30 LAB — POCT I-STAT CREATININE: Creatinine, Ser: 0.7 mg/dL (ref 0.44–1.00)

## 2022-07-30 MED ORDER — IOHEXOL 300 MG/ML  SOLN
75.0000 mL | Freq: Once | INTRAMUSCULAR | Status: AC | PRN
  Administered 2022-07-30: 75 mL via INTRAVENOUS

## 2022-07-31 ENCOUNTER — Other Ambulatory Visit: Payer: Self-pay | Admitting: Internal Medicine

## 2022-07-31 DIAGNOSIS — G2581 Restless legs syndrome: Secondary | ICD-10-CM

## 2022-08-04 ENCOUNTER — Telehealth: Payer: Self-pay | Admitting: Gastroenterology

## 2022-08-04 NOTE — Telephone Encounter (Signed)
Patient is calling to cancel her appt for 2/12 wanting to reschedule, states it's not a regular appointment and she shouldn't have to wait until April. Please advise

## 2022-08-04 NOTE — Progress Notes (Unsigned)
Assessment and Plan: Crystal [name] was seen today for acute visit.  Diagnoses and all orders for this visit:  Essential hypertension Controlled without medication - continue  DASH diet, exercise and monitor at home. Call if greater than 130/80.  -     CBC with Differential/Platelet -     COMPLETE METABOLIC PANEL WITH GFR  Vitamin D deficiency Continue Vit D supplementation to maintain value in therapeutic level of 60-100  -     VITAMIN D 25 Hydroxy (Vit-D Deficiency, Fractures)  Fibromyalgia Has upcoming appointment with rheumatology on 08/13/22- keep appt Has stopped medication Azathioprine and has noticed some improvement in fatigue -     CBC with Differential/Platelet -     COMPLETE METABOLIC PANEL WITH GFR -     VITAMIN D 25 Hydroxy (Vit-D Deficiency, Fractures)       Further disposition pending results of labs. Discussed med's effects and SE's.   Over 30 minutes of exam, counseling, chart review, and critical decision making was performed.   Future Appointments  Date Time Provider [place]  08/15/2022 11:15 AM [facility] ([place]) [facility]  08/19/2022  3:00 PM Darrol Jump, NP [facility] None  08/28/2022 11:00 AM Armbruster, Carlota Raspberry, MD [facility]  09/03/2022 11:00 AM [name], MD [facility]  05/18/2023  2:00 PM Unk Pinto, MD [facility] None    ------------------------------------------------------------------------------------------------------------------   HPI BP 138/72   Pulse 81   Temp (!) 97.3 F (36.3 C)   Ht 5' 3.5" (1.613 m)   Wt 141 lb (64 kg)   SpO2 99%   BMI 24.59 kg/m   72 y.o.female presents for coughing and the tessalon perles have been helping. She had been having muscle and joint aches over the weekend. Stopped Azathioprine and symptoms have improved. She continues to have fatigue and is using Inspire implant for OSA CPAP that was placed 03/2022.  Continues to have some difficulty swallowing pills since  having inspire device inserted.    BMI is Body mass index is 24.59 kg/m., she has been working on diet and exercise. Wt Readings from Last 3 Encounters:  08/05/22 141 lb (64 kg)  07/22/22 141 lb 6.4 oz (64.1 kg)  07/18/22 141 lb 6.4 oz (64.1 kg)    BP is currently well controlled without medication.   BP Readings from Last 3 Encounters:  08/05/22 138/72  07/22/22 128/70  07/18/22 124/72     Past Medical History:  Diagnosis Date   Arthritis    ASCVD (arteriosclerotic cardiovascular disease)    Closed nondisplaced subtrochanteric fracture of left femur ([place]) 09/03/2017   September 2018.  Status post surgery by Dr. Marcelino Scot   Crohn's disease of ileum without complication ([place])    DDD (degenerative disc disease), lumbar    Depression    Dyspnea    Eye infection    Fibromyalgia    Gait disorder 10/26/2012   GERD (gastroesophageal reflux disease)    GI bleed    Hyperlipidemia    Hypertension    Hypothyroid    Periprosthetic supracondylar fracture of femur, left  03/23/2017   Pneumonia    Pre-diabetes    Restless leg syndrome    Rhinitis 06/25/2010   Sleep apnea    does not use CPAP   Spinal headache    with the C section   TIA (transient ischemic attack)    3         Vitamin D deficiency      Allergies  Allergen Reactions  Methocarbamol Shortness Of Breath, Nausea Only and Other (See Comments)    Other reaction(s): Dizziness, psychological reaction, disoriented Sleepy and weight loss    Codeine Other (See Comments)    Headache   Statins Other (See Comments)    Extreme pain   Other Other (See Comments)    MINT=sore mouth and stomach upset   Oxycodone     Dizziness, unbalanced.   Amitriptyline Other (See Comments)    Confusion and hallucinations   Erythromycin Base Rash    Flushing/red face.   Fish Oil Nausea Only   Flax Seed [Flax Seed Oil] Rash   Flaxseed (Linseed) Rash   Hydrocodone Itching and Rash        Morphine Nausea And Vomiting and Rash    Nsaids Nausea Only   Nuvigil [Armodafinil] Other (See Comments)    Unspecified   Sertraline Rash   Sinemet [Carbidopa W-Levodopa] Rash   Sulfa Antibiotics Rash   Tolmetin Nausea Only    Current Outpatient Medications on File Prior to Visit  Medication Sig   acetaminophen (TYLENOL) 500 MG tablet Take 1,000 mg by mouth in the morning, at noon, and at bedtime.   benzonatate (TESSALON) 200 MG capsule TAKE 1 CAPSULE (PERLE) BY MOUTH 3 X /DAY ONLY IF NEEDED FOR SEVERE COUGH   Black Pepper-Turmeric (TURMERIC PLUS BLACK PEPPER EXT PO) Take by mouth.   carboxymethylcellulose (REFRESH PLUS) 0.5 % SOLN Place 1 drop into both eyes 3 (three) times daily as needed (dry eyes).   CINNAMON PO Take by mouth.   clobetasol ointment (TEMOVATE) 1.61 % Apply 1 Application topically 3 (three) times daily as needed (lichen planus).   clotrimazole (MYCELEX) 10 MG troche Take 10 mg by mouth 3 (three) times daily.   dexamethasone (DECADRON) 0.5 MG/5ML solution Take 0.5 mg by mouth 3 (three) times daily.   diclofenac Sodium (VOLTAREN) 1 % GEL Apply 2 g topically daily as needed (pain).   docusate sodium (COLACE) 100 MG capsule Take 100 mg by mouth daily as needed for mild constipation or moderate constipation.   DULoxetine (CYMBALTA) 60 MG capsule TAKE 1 CAPSULE DAILY FOR CHRONIC PAIN   fenofibrate micronized (LOFIBRA) 134 MG capsule TAKE 1 CAPSULE BY MOUTH DAILY BEFORE BREAKFAST.   Ferrous Sulfate (IRON PO) Take 25 mg by mouth daily.   fluticasone (FLONASE) 50 MCG/ACT nasal spray USE TWO SPRAYS IN EACH NOSTRIL DAILY AS NEEDED (Patient taking differently: Place 2 sprays into both nostrils at bedtime.)   gabapentin (NEURONTIN) 300 MG capsule Take 1 capsule 4 x /day for Chronic Pain         ( Dx: m79.7)                                               /                                TAKE                                   BY                                  MOUTH   guaiFENesin (  MUCUS RELIEF ADULT PO) Take 1 capsule by mouth  daily.   hydroxychloroquine (PLAQUENIL) 200 MG tablet Take 200 mg by mouth 2 (two) times daily.   levothyroxine (SYNTHROID) 88 MCG tablet Take  1 tablet  Daily  on an empty stomach with only water for 30 minutes & no Antacid meds, Calcium or Magnesium for 4 hours & avoid Biotin   linaclotide (LINZESS) 145 MCG CAPS capsule Take 1 capsule (145 mcg total) by mouth daily before breakfast. Take 30 minutes before a meal   meclizine (ANTIVERT) 25 MG tablet TAKE 1 TABLET (25 MG TOTAL) BY MOUTH 3 (THREE) TIMES DAILY AS NEEDED FOR DIZZINESS.   mupirocin ointment (BACTROBAN) 2 % Apply 1 Application topically 3 (three) times daily as needed (lichen planus).   neomycin-polymyxin-hydrocortisone (CORTISPORIN) OTIC solution Place 3 drops into both ears daily as needed (ear inflammation).   omeprazole (PRILOSEC) 40 MG capsule Take 1 capsule (40 mg total) by mouth daily.   OVER THE COUNTER MEDICATION Place 1 drop under the tongue daily. D3 5000 units - K2 120 mcg   polyethylene glycol (MIRALAX / GLYCOLAX) 17 g packet Take 17-34 g by mouth at bedtime.   rOPINIRole (REQUIP) 2 MG tablet Take  1 tablet  2 x / day  &  2 tablets  at Bedtime  for Restless Legs   tiZANidine (ZANAFLEX) 2 MG tablet Take 2 mg by mouth at bedtime.   tobramycin (TOBREX) 0.3 % ophthalmic solution Place 1 drop into the right eye 4 (four) times daily.   traMADol (ULTRAM) 50 MG tablet Take 50 mg by mouth every 6 (six) hours as needed for moderate pain or severe pain.   buPROPion (WELLBUTRIN XL) 150 MG 24 hr tablet TAKE 1 TABLET DAILY FOR MOOD, FOCUS & CONCENTRATION (Patient not taking: Reported on 08/05/2022)   oxybutynin (DITROPAN) 5 MG tablet Take 5 mg by mouth daily. (Patient not taking: Reported on 08/05/2022)   No current facility-administered medications on file prior to visit.    ROS: all negative except above.   Physical Exam:  BP 138/72   Pulse 81   Temp (!) 97.3 F (36.3 C)   Ht 5' 3.5" (1.613 m)   Wt 141 lb (64 kg)   SpO2 99%    BMI 24.59 kg/m   General Appearance: Pleasant , frail female in no apparent distress. Eyes: PERRLA, EOMs, conjunctiva no swelling or erythema Sinuses: No Frontal/maxillary tenderness ENT/Mouth: Ext aud canals clear, TMs without erythema, bulging. No erythema, swelling, or exudate on post pharynx.  Tonsils not swollen or erythematous. Hearing normal.  Neck: Supple, thyroid normal.  Respiratory: Respiratory effort normal, BS equal bilaterally without rales, rhonchi, wheezing or stridor.  Cardio: RRR with no MRGs. Brisk peripheral pulses without edema.  Abdomen: Soft, + BS.  Mild tenderness throughout abdomen Lymphatics: Non tender without lymphadenopathy.  Musculoskeletal: Full ROM, 5/5 strength, normal gait.  Skin: Warm, dry without rashes, lesions, ecchymosis.  Neuro: Cranial nerves intact. Normal muscle tone, no cerebellar symptoms. Sensation intact.  Psych: Awake and oriented X 3, normal affect, Insight and Judgment appropriate.     Alycia Rossetti, NP 10:33 AM [facility]

## 2022-08-04 NOTE — Telephone Encounter (Signed)
Returned call to patient. We rescheduled her appt to Thursday, 08/28/22 at 11 am. Pt verbalized understanding and had no concerns at the end of the call.

## 2022-08-05 ENCOUNTER — Other Ambulatory Visit: Payer: Medicare PPO

## 2022-08-05 ENCOUNTER — Other Ambulatory Visit: Payer: Self-pay | Admitting: Nurse Practitioner

## 2022-08-05 ENCOUNTER — Encounter: Payer: Self-pay | Admitting: Nurse Practitioner

## 2022-08-05 ENCOUNTER — Ambulatory Visit (INDEPENDENT_AMBULATORY_CARE_PROVIDER_SITE_OTHER): Payer: Medicare PPO | Admitting: Nurse Practitioner

## 2022-08-05 VITALS — BP 138/72 | HR 81 | Temp 97.3°F | Ht 63.5 in | Wt 141.0 lb

## 2022-08-05 DIAGNOSIS — E559 Vitamin D deficiency, unspecified: Secondary | ICD-10-CM | POA: Diagnosis not present

## 2022-08-05 DIAGNOSIS — I10 Essential (primary) hypertension: Secondary | ICD-10-CM

## 2022-08-05 DIAGNOSIS — M797 Fibromyalgia: Secondary | ICD-10-CM

## 2022-08-05 DIAGNOSIS — G2581 Restless legs syndrome: Secondary | ICD-10-CM

## 2022-08-05 MED ORDER — ROPINIROLE HCL 2 MG PO TABS: ORAL_TABLET | ORAL | 3 refills | Status: DC

## 2022-08-05 NOTE — Patient Instructions (Signed)

## 2022-08-06 LAB — CBC WITH DIFFERENTIAL/PLATELET
Absolute Monocytes: 369 cells/uL (ref 200–950)
Basophils Absolute: 68 cells/uL (ref 0–200)
Basophils Relative: 1.3 %
Eosinophils Absolute: 161 cells/uL (ref 15–500)
Eosinophils Relative: 3.1 %
HCT: 37 % (ref 35.0–45.0)
Hemoglobin: 12.3 g/dL (ref 11.7–15.5)
Lymphs Abs: 1128 cells/uL (ref 850–3900)
MCH: 28.2 pg (ref 27.0–33.0)
MCHC: 33.2 g/dL (ref 32.0–36.0)
MCV: 84.9 fL (ref 80.0–100.0)
MPV: 9 fL (ref 7.5–12.5)
Monocytes Relative: 7.1 %
Neutro Abs: 3474 cells/uL (ref 1500–7800)
Neutrophils Relative %: 66.8 %
Platelets: 334 10*3/uL (ref 140–400)
RBC: 4.36 10*6/uL (ref 3.80–5.10)
RDW: 13.1 % (ref 11.0–15.0)
Total Lymphocyte: 21.7 %
WBC: 5.2 10*3/uL (ref 3.8–10.8)

## 2022-08-06 LAB — COMPLETE METABOLIC PANEL WITH GFR
AG Ratio: 1.6 (calc) (ref 1.0–2.5)
ALT: 6 U/L (ref 6–29)
AST: 13 U/L (ref 10–35)
Albumin: 3.8 g/dL (ref 3.6–5.1)
Alkaline phosphatase (APISO): 57 U/L (ref 37–153)
BUN: 16 mg/dL (ref 7–25)
CO2: 31 mmol/L (ref 20–32)
Calcium: 9.3 mg/dL (ref 8.6–10.4)
Chloride: 105 mmol/L (ref 98–110)
Creat: 0.67 mg/dL (ref 0.60–1.00)
Globulin: 2.4 g/dL (calc) (ref 1.9–3.7)
Glucose, Bld: 143 mg/dL — ABNORMAL HIGH (ref 65–99)
Potassium: 5 mmol/L (ref 3.5–5.3)
Sodium: 142 mmol/L (ref 135–146)
Total Bilirubin: 0.3 mg/dL (ref 0.2–1.2)
Total Protein: 6.2 g/dL (ref 6.1–8.1)
eGFR: 92 mL/min/{1.73_m2} (ref >=60)

## 2022-08-06 LAB — VITAMIN D 25 HYDROXY (VIT D DEFICIENCY, FRACTURES): Vit D, 25-Hydroxy: 60 ng/mL (ref 30–100)

## 2022-08-11 ENCOUNTER — Other Ambulatory Visit: Payer: Self-pay | Admitting: Nurse Practitioner

## 2022-08-11 ENCOUNTER — Ambulatory Visit: Payer: Medicare PPO | Admitting: Gastroenterology

## 2022-08-13 DIAGNOSIS — M549 Dorsalgia, unspecified: Secondary | ICD-10-CM | POA: Diagnosis not present

## 2022-08-13 DIAGNOSIS — M2550 Pain in unspecified joint: Secondary | ICD-10-CM | POA: Diagnosis not present

## 2022-08-13 DIAGNOSIS — M79643 Pain in unspecified hand: Secondary | ICD-10-CM | POA: Diagnosis not present

## 2022-08-13 DIAGNOSIS — M79642 Pain in left hand: Secondary | ICD-10-CM | POA: Diagnosis not present

## 2022-08-13 DIAGNOSIS — M791 Myalgia, unspecified site: Secondary | ICD-10-CM | POA: Diagnosis not present

## 2022-08-13 DIAGNOSIS — M1990 Unspecified osteoarthritis, unspecified site: Secondary | ICD-10-CM | POA: Diagnosis not present

## 2022-08-13 DIAGNOSIS — M79672 Pain in left foot: Secondary | ICD-10-CM | POA: Diagnosis not present

## 2022-08-13 DIAGNOSIS — M79641 Pain in right hand: Secondary | ICD-10-CM | POA: Diagnosis not present

## 2022-08-13 DIAGNOSIS — L439 Lichen planus, unspecified: Secondary | ICD-10-CM | POA: Diagnosis not present

## 2022-08-13 DIAGNOSIS — M7989 Other specified soft tissue disorders: Secondary | ICD-10-CM | POA: Diagnosis not present

## 2022-08-13 DIAGNOSIS — K5090 Crohn's disease, unspecified, without complications: Secondary | ICD-10-CM | POA: Diagnosis not present

## 2022-08-13 DIAGNOSIS — M79671 Pain in right foot: Secondary | ICD-10-CM | POA: Diagnosis not present

## 2022-08-15 ENCOUNTER — Ambulatory Visit
Admission: RE | Admit: 2022-08-15 | Discharge: 2022-08-15 | Disposition: A | Discharge status: Home / Self Care | Payer: Medicare PPO | Source: Ambulatory Visit | Attending: Otolaryngology | Admitting: Otolaryngology

## 2022-08-15 DIAGNOSIS — R1314 Dysphagia, pharyngoesophageal phase: Secondary | ICD-10-CM

## 2022-08-15 DIAGNOSIS — R1310 Dysphagia, unspecified: Secondary | ICD-10-CM | POA: Diagnosis not present

## 2022-08-19 ENCOUNTER — Ambulatory Visit (INDEPENDENT_AMBULATORY_CARE_PROVIDER_SITE_OTHER): Payer: Medicare PPO | Admitting: Nurse Practitioner

## 2022-08-19 ENCOUNTER — Ambulatory Visit: Payer: Medicare PPO | Admitting: Nurse Practitioner

## 2022-08-19 VITALS — BP 132/82 | HR 98 | Temp 97.2°F | Ht 63.5 in | Wt 143.4 lb

## 2022-08-19 DIAGNOSIS — M797 Fibromyalgia: Secondary | ICD-10-CM

## 2022-08-19 DIAGNOSIS — E039 Hypothyroidism, unspecified: Secondary | ICD-10-CM

## 2022-08-19 DIAGNOSIS — E785 Hyperlipidemia, unspecified: Secondary | ICD-10-CM

## 2022-08-19 DIAGNOSIS — F3341 Major depressive disorder, recurrent, in partial remission: Secondary | ICD-10-CM

## 2022-08-19 DIAGNOSIS — R6889 Other general symptoms and signs: Secondary | ICD-10-CM

## 2022-08-19 DIAGNOSIS — Z6825 Body mass index (BMI) 25.0-25.9, adult: Secondary | ICD-10-CM

## 2022-08-19 DIAGNOSIS — I10 Essential (primary) hypertension: Secondary | ICD-10-CM

## 2022-08-19 DIAGNOSIS — N3281 Overactive bladder: Secondary | ICD-10-CM | POA: Diagnosis not present

## 2022-08-19 DIAGNOSIS — G2581 Restless legs syndrome: Secondary | ICD-10-CM

## 2022-08-19 DIAGNOSIS — J479 Bronchiectasis, uncomplicated: Secondary | ICD-10-CM

## 2022-08-19 DIAGNOSIS — Z0001 Encounter for general adult medical examination with abnormal findings: Secondary | ICD-10-CM | POA: Diagnosis not present

## 2022-08-19 DIAGNOSIS — G4733 Obstructive sleep apnea (adult) (pediatric): Secondary | ICD-10-CM

## 2022-08-19 DIAGNOSIS — M4802 Spinal stenosis, cervical region: Secondary | ICD-10-CM

## 2022-08-19 DIAGNOSIS — M8580 Other specified disorders of bone density and structure, unspecified site: Secondary | ICD-10-CM

## 2022-08-19 DIAGNOSIS — K5000 Crohn's disease of small intestine without complications: Secondary | ICD-10-CM

## 2022-08-19 DIAGNOSIS — Z0000 Encounter for general adult medical examination without abnormal findings: Secondary | ICD-10-CM

## 2022-08-19 DIAGNOSIS — K589 Irritable bowel syndrome without diarrhea: Secondary | ICD-10-CM

## 2022-08-19 DIAGNOSIS — J309 Allergic rhinitis, unspecified: Secondary | ICD-10-CM | POA: Diagnosis not present

## 2022-08-19 DIAGNOSIS — M5090 Cervical disc disorder, unspecified, unspecified cervical region: Secondary | ICD-10-CM

## 2022-08-19 DIAGNOSIS — I2510 Atherosclerotic heart disease of native coronary artery without angina pectoris: Secondary | ICD-10-CM

## 2022-08-19 DIAGNOSIS — Z79899 Other long term (current) drug therapy: Secondary | ICD-10-CM

## 2022-08-19 DIAGNOSIS — K219 Gastro-esophageal reflux disease without esophagitis: Secondary | ICD-10-CM

## 2022-08-19 DIAGNOSIS — N182 Chronic kidney disease, stage 2 (mild): Secondary | ICD-10-CM

## 2022-08-19 DIAGNOSIS — G4700 Insomnia, unspecified: Secondary | ICD-10-CM

## 2022-08-19 DIAGNOSIS — E559 Vitamin D deficiency, unspecified: Secondary | ICD-10-CM

## 2022-08-19 DIAGNOSIS — D508 Other iron deficiency anemias: Secondary | ICD-10-CM

## 2022-08-19 DIAGNOSIS — E1169 Type 2 diabetes mellitus with other specified complication: Secondary | ICD-10-CM

## 2022-08-19 DIAGNOSIS — E1122 Type 2 diabetes mellitus with diabetic chronic kidney disease: Secondary | ICD-10-CM

## 2022-08-19 DIAGNOSIS — Z9181 History of falling: Secondary | ICD-10-CM

## 2022-08-19 NOTE — Progress Notes (Unsigned)
MEDICARE ANNUAL WELLNESS VISIT AND FOLLOW UP  Assessment:   Medicare annual wellness visit, subsequent Due annually Health maintenance reviewed.    Essential hypertension Controlled. Discussed DASH (Dietary Approaches to Stop Hypertension) DASH diet is lower in sodium than a typical American diet. Cut back on foods that are high in saturated fat, cholesterol, and trans fats. Eat more whole-grain foods, fish, poultry, and nuts Remain active and exercise as tolerated daily.  Monitor BP at home-Call if greater than 130/80.  Check CMP/CBC  Overactive bladder Continue to monitor  ASCVD (arteriosclerotic cardiovascular disease) Continue to maximize cholesterol management and BP management  Obstructive sleep apnea 04/2022 Never Stimulant Implanted Continue to monitor  Allergic rhinitis, unspecified allergic rhinitis type Continue antihistamine, medication. Avoid triggers  Lifestyle controlled diabetes [facility]) Education: Reviewed 'ABCs' of diabetes management  Discussed goals to be met and/or maintained include A1C (<7) Blood pressure (<130/80) Cholesterol (LDL <70) Continue Eye Exam yearly  Continue Dental Exam Q6 mo Discussed dietary recommendations Discussed Physical Activity recommendations Check A1C   Hypothyroidism, unspecified hypothyroidism type Controlled. Continue Levothyroxine. Reminded to take on an empty stomach 30-41mns before food.  Stop any Biotin Supplement 48-72 hours before next TSH level to reduce the risk of falsely low TSH levels. Continue to monitor.    Hyperlipidemia associated with T2DM (HCC)/Statin Myopathy Statin intolerant - continue fenofibrate  Discussed lifestyle modifications. Recommended diet heavy in fruits and veggies, omega 3's. Decrease consumption of animal meats, cheeses, and dairy products. Remain active and exercise as tolerated. Continue to monitor. Check lipids/TSH  CKD 2 associated with T2DM ([place] Discussed how what you  eat and drink can aide in kidney protection. Stay well hydrated. Avoid high salt foods. Avoid NSAIDS. Keep BP and BG well controlled.   Take medications as prescribed. Remain active and exercise as tolerated daily. Maintain weight.  Continue to monitor. Check CMP/GFR/Microablumin  Vitamin D deficiency Continue supplement for a goal of 60-100 Monitor levels  Medication management All medications discussed and reviewed in full. All questions and concerns regarding medications addressed.    Gastroesophageal reflux disease, esophagitis presence not specified No suspected reflux complications ([place]/stricture). Lifestyle modification:  wt loss, avoid meals 2-3h before bedtime. Consider eliminating food triggers:  chocolate, caffeine, EtOH, acid/spicy food.  Crohn's disease with complication, unspecified gastrointestinal tract location ([place] Followed by Dr. MEarlean Shawl  IBS (irritable colon syndrome) Followed by Dr. MEarlean Shawl Fibromyalgia Followed by Dr. HMaryjean Ka RESTLESS LEG SYNDROME Followed by Dr. HMaryjean Ka continue requip  Depression, remission partial, recurrent ([place] Continue medications  Reviewed relaxation techniques.  Sleep hygiene. Recommended mindfulness meditation and exercise.   Encouraged personality growth wand development through coping techniques and problem-solving skills. Limit/Decrease/Monitor drug/alcohol intake.    Cervical pain/spinal stenosis Followed by neurosurgery  Anemia due to vitamin B12 deficiency, unspecified B12 deficiency type Continue supplement Monitor CBC/Anemia Panel   Insomnia, unspecified type Discussed good sleep hygiene. Establish bed and wake times. Sleep restriction-only sleep estimated hrs sleep. Bed only for sex and sleep, only sleep when sleepy, out of bed if anxious (stimulus control). Reviewed relaxation techniques, mindful meditations. Expected sleep duration. Addressed worries about not sleeping.   Osteopenia UTD -  Continue PT Pursue a combination of weight-bearing exercises and strength training. Advised on fall prevention measures including proper lighting in all rooms, removal of area rugs and floor clutter, use of walking devices as deemed appropriate, avoidance of uneven walking surfaces. Smoking cessation and moderate alcohol consumption if applicable Consume [id number] 1000 IU of vitamin D daily with  a goal vitamin D serum value of 30 ng/mL or higher. Aim for 1000 to 1200 mg of elemental calcium daily through supplements and/or dietary sources.  BMI 25 Discussed appropriate BMI Diet modification. Physical activity. Encouraged/praised to build confidence.  Bronchiectasis without complication ([place]) Monitor; continue guaifenacin  High risk falls/unsteady gait Continue PT, continue reduce sedating medications, in process of workup with neuro, ortho follows  Polypharmacy Continue regular med reviews and d/c meds when possible.   Reviewed of most recent lab work 08/05/22 is stable - patient requests to defer at this time.   Notify office for further evaluation and treatment, questions or concerns if any reported s/s fail to improve.   The patient was advised to call back or seek an in-person evaluation if any symptoms worsen or if the condition fails to improve as anticipated.   Further disposition pending results of labs. Discussed med's effects and SE's.    I discussed the assessment and treatment plan with the patient. The patient was provided an opportunity to ask questions and all were answered. The patient agreed with the plan and demonstrated an understanding of the instructions.  Discussed med's effects and SE's. Screening labs and tests as requested with regular follow-up as recommended.  I provided 30 minutes of face-to-face time during this encounter including counseling, chart review, and critical decision making was preformed.   Future Appointments  Date Time Provider [place]  08/28/2022 11:00 AM Armbruster, Carlota Raspberry, MD [facility]  09/03/2022 11:00 AM [name], MD [facility]  05/18/2023  2:00 PM Unk Pinto, MD [facility] None    Plan:   During the course of the visit the patient was educated and counseled about appropriate screening and preventive services including:   Pneumococcal vaccine  Influenza vaccine Td vaccine Prevnar 13 Screening electrocardiogram Screening mammography Bone densitometry screening Colorectal cancer screening Diabetes screening Glaucoma screening Nutrition counseling  Advanced directives: given info/requested copies   Subjective:   Crystal [name] is a 72 y.o. female who presents for Medicare Annual Wellness Visit and 3 month OV for diabetes, htn, hyperlipidemia, CKD, vitamin D.  She saw [facility] 01/21/22:  For possible Inspire implant for severe OSA.  She was unble to tolerate CPAP d/t nasal obstructionand worsening sleep disturbance.  She has chronic nasal airway obstruction and traumatic nasal onstruction on the right.No prior nasal surger.  currenlty underrgoing neuro work up at [facility] for chronic movement disorder.  It was recommended that she undergo a sleep endoscopy at [facility] surgery to assess airway obstruction oat the base of the tongue, to be scheduled at her convenience and evaluation of possible nasal airway reconstruction.  The inspire certification process has been started.  She will continue with CPAP until then.  Saw ENT 04/02/22:  OSA and CPAP intolerance Her drug induce sleep endoscopy showed obstruction from the tongue base and pallet from interior to posterior in [place] approval.  She has now had nerve stimulator placed.   Saw Cardiology 02/24/22 - Echo and Ziopatch completed. Negative.   Completed Diabetic Retin procedure with [facility] 7/26*/23 and negative.  Due 12/2022  She has a Chronic Pain Syndrome consequent of multiple Neck & back  surgeries & followed by [place] neuro surgical - tramadol, takes intermittently. She is on cymbalta for choronic pain, wellbutrin for mood and focus. Reports some seasonal down days but overall improved since son passed back in 2012.  She has frequent falls, impaired gait and complex ortho hx, patient was  seen by [facility] neurologist Dr. Orvilla Fus and in process of workup with EMG/NCV and MRI.   12/23/20 She had right knee surgery for hole in the knee cap.  Continues to have some pain in that knee.  She is going to have her left middle finger surgery soon, pending has arthritis and pain in the finger.  On oxybutynin 5 mg BID due to OAB/ICS with benefit, hasn't been able to tolerate tapering per patient but no off.  She was provided samples of Myrbetriq but no longer taking d/t SE.  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   She has Crohn's managed by Dr. Earlean Shawl, was on humira without recent flares.   BMI is Body mass index is 25 kg/m., she has not been working on diet, Wt Readings from Last 3 Encounters:  08/19/22 143 lb 6.4 oz (65 kg)  08/05/22 141 lb (64 kg)  07/22/22 141 lb 6.4 oz (64.1 kg)   Her blood pressure has been controlled at home, today their BP is BP: 132/82 BP Readings from Last 3 Encounters:  08/19/22 132/82  08/05/22 138/72  07/22/22 128/70    She does not workout, she has upcoming shoulder replacement, seeing specialist.   She denies chest pain, shortness of breath, dizziness.   She is on cholesterol medication  and denies myalgias. Her cholesterol is not at goal of LDL <70. The cholesterol last visit was:   Lab Results  Component Value Date   CHOL 174 05/12/2022   HDL 47 (L) 05/12/2022   LDLCALC 99 05/12/2022   TRIG 182 (H) 05/12/2022   CHOLHDL 3.7 05/12/2022   She has been working on diet for lifestyle controlled diabetes (A1C 6.6%, 6.7% in 2016), and denies foot ulcerations, increased appetite, nausea, paresthesia of  the feet, polydipsia, polyuria, visual disturbances, vomiting and weight loss.  Last A1C in the office was:  Lab Results  Component Value Date   HGBA1C 5.5 05/12/2022   She has CKD II associated with lifestyle controlled diabetes, on olmesartan:  Lab Results  Component Value Date   GFRNONAA >60 01/10/2022   Patient is on Vitamin D supplement, at goal last visit.  Lab Results  Component Value Date   VD25OH 60 08/05/2022     She is on thyroid medication. Her medication was not changed last visit. Taking 100 mcg tab daily.   Lab Results  Component Value Date   TSH 3.83 05/12/2022  .     Medication Review Current Outpatient Medications on File Prior to Visit  Medication Sig   acetaminophen (TYLENOL) 500 MG tablet Take 1,000 mg by mouth in the morning, at noon, and at bedtime.   benzonatate (TESSALON) 200 MG capsule TAKE 1 CAPSULE (PERLE) BY MOUTH 3 X /DAY ONLY IF NEEDED FOR SEVERE COUGH   Black Pepper-Turmeric (TURMERIC PLUS BLACK PEPPER EXT PO) Take by mouth.   carboxymethylcellulose (REFRESH PLUS) 0.5 % SOLN Place 1 drop into both eyes 3 (three) times daily as needed (dry eyes).   CINNAMON PO Take by mouth.   clobetasol ointment (TEMOVATE) [id number] % Apply 1 Application topically 3 (three) times daily as needed (lichen planus).   clotrimazole (MYCELEX) 10 MG troche Take 10 mg by mouth 3 (three) times daily.   dexamethasone (DECADRON) 0.5 MG/5ML solution Take 0.5 mg by mouth 3 (three) times daily.   diclofenac Sodium (VOLTAREN) 1 % GEL Apply 2 g topically daily as needed (pain).   docusate sodium (COLACE) 100 MG capsule  Take 100 mg by mouth daily as needed for mild constipation or moderate constipation.   DULoxetine (CYMBALTA) 60 MG capsule TAKE 1 CAPSULE DAILY FOR CHRONIC PAIN   fenofibrate micronized (LOFIBRA) 134 MG capsule TAKE 1 CAPSULE EVERY DAY BEFORE BREAKFAST   Ferrous Sulfate (IRON PO) Take 25 mg by mouth daily.   fluticasone (FLONASE) 50 MCG/ACT nasal spray USE TWO SPRAYS  IN EACH NOSTRIL DAILY AS NEEDED (Patient taking differently: Place 2 sprays into both nostrils at bedtime.)   gabapentin (NEURONTIN) 300 MG capsule Take 1 capsule 4 x /day for Chronic Pain         ( Dx: m79.7)                                               /                                TAKE                                   BY                                  MOUTH   guaiFENesin (MUCUS RELIEF ADULT PO) Take 1 capsule by mouth daily.   hydroxychloroquine (PLAQUENIL) 200 MG tablet Take 200 mg by mouth 2 (two) times daily.   levothyroxine (SYNTHROID) 88 MCG tablet Take  1 tablet  Daily  on an empty stomach with only water for 30 minutes & no Antacid meds, Calcium or Magnesium for 4 hours & avoid Biotin   linaclotide (LINZESS) 145 MCG CAPS capsule Take 1 capsule (145 mcg total) by mouth daily before breakfast. Take 30 minutes before a meal   meclizine (ANTIVERT) 25 MG tablet TAKE 1 TABLET (25 MG TOTAL) BY MOUTH 3 (THREE) TIMES DAILY AS NEEDED FOR DIZZINESS.   mupirocin ointment (BACTROBAN) 2 % Apply 1 Application topically 3 (three) times daily as needed (lichen planus).   neomycin-polymyxin-hydrocortisone (CORTISPORIN) OTIC solution Place 3 drops into both ears daily as needed (ear inflammation).   omeprazole (PRILOSEC) 40 MG capsule Take 1 capsule (40 mg total) by mouth daily.   OVER THE COUNTER MEDICATION Place 1 drop under the tongue daily. D3 5000 units - K2 120 mcg   polyethylene glycol (MIRALAX / GLYCOLAX) 17 g packet Take 17-34 g by mouth at bedtime.   rOPINIRole (REQUIP) 2 MG tablet Take  1 tablet  2 x / day  &  2 tablets  at Bedtime  for Restless Legs   tiZANidine (ZANAFLEX) 2 MG tablet Take 2 mg by mouth at bedtime.   tobramycin (TOBREX) 0.3 % ophthalmic solution Place 1 drop into the right eye 4 (four) times daily.   traMADol (ULTRAM) 50 MG tablet Take 50 mg by mouth every 6 (six) hours as needed for moderate pain or severe pain.   No current facility-administered medications on file prior  to visit.     Current Problems (verified) Patient Active Problem List   Diagnosis Date Noted   Dizziness 02/11/2022   Lumbar spinal stenosis 09/05/2021   Aortic atherosclerosis ([place]) by Chest CT scan on 09/2017 07/05/2020   CKD  stage 2 due to type 2 diabetes mellitus ([place]) 11/16/2019   COVID-19 virus infection 10/19/2019   Presence of artificial knee joint, right 09/20/2019   History of total knee arthroplasty, right 05/17/2019   Pain due to total right knee replacement ([place]) 05/17/2019   History of recurrent TIAs 11/15/2018   Rupture of popliteal cyst of right knee region (complex cyst) 11/03/2018   Diet-controlled type 2 diabetes mellitus ([place]) 07/04/2018   History of adenomatous polyp of colon 10/18/2017   Bronchiectasis without complication ([place]) [id number]   Diverticulosis 10/12/2017   Internal hemorrhoids 10/12/2017   Polypharmacy 04/09/2017   Arthritis of carpometacarpal (CMC) joints of both thumbs 02/12/2017   Cervical disc disease 01/23/2017   Crohn's ileitis ([place]) 01/23/2017   S/P revision of total knee, left 12/18/2016   B12 deficiency anemia 06/16/2016   Sleep talking 03/26/2016   Dyspnea on exertion 11/08/2015   IBS (irritable colon syndrome) 08/09/2015   Spinal stenosis in cervical region 06/14/2015   Other specified arthropathy involving shoulder region 11/23/2014   Obesity (BMI 30.0-34.9) 10/31/2014   Medication management 10/25/2013   Rotator cuff arthropathy 09/15/2013   Hyperlipidemia associated with type 2 diabetes mellitus ([place])    Hypothyroid    Depression, major, recurrent, in partial remission ([place])    Vitamin D deficiency    ASCVD    Gait disorder 10/26/2012   Knee pain 09/07/2012   Rotator cuff tear 08/23/2012   Bilateral bunions 11/19/2011   Rhinitis 06/25/2010   Obstructive sleep apnea 06/21/2010   RESTLESS LEG SYNDROME 06/21/2010   Essential hypertension 06/21/2010   GERD 06/21/2010   Crohn's Enterocolitis / Regional enteritis 06/21/2010    Fibromyalgia 06/21/2010    Screening Tests Immunization History  Administered Date(s) Administered   DT (Pediatric) 01/03/2015   Fluad Quad(high Dose 65+) 04/25/2022   Influenza Split 03/30/2010, 03/31/2011, 05/14/2014, 02/27/2016, 04/16/2018   Influenza Whole 03/30/2010   Influenza, High Dose Seasonal PF 03/13/2015, 07/21/2019, 02/16/2020, 04/08/2021   Influenza-Unspecified 03/31/2011, 05/14/2014, 02/27/2016, 04/16/2018   PFIZER Comirnaty(Gray Top)Covid-19 Tri-Sucrose Vaccine 09/19/2019, 10/10/2019, 07/13/2020   PFIZER(Purple Top)SARS-COV-2 Vaccination 09/19/2019, 10/10/2019   Pfizer Covid-19 Vaccine Bivalent Booster 19yr & up 05/14/2021   Pneumococcal Conjugate-13 06/30/2005, 04/17/2015, 01/23/2019   Pneumococcal Polysaccharide-23 01/06/2017   Pneumococcal-Unspecified 06/30/2005   Respiratory Syncytial Virus Vaccine,Recomb Aduvanted(Arexvy) 07/01/2022   Td 06/30/2004   Td,absorbed, Preservative Free, Adult Use, Lf Unspecified 06/30/2004   Unspecified SARS-COV-2 Vaccination 04/25/2022   Zoster Recombinat (Shingrix) 05/14/2021, 08/22/2021   Zoster, Live 07/01/2007   Health Maintenance  Topic Date Due   COVID-19 Vaccine (8 - 2023-24 season) 06/20/2022   HEMOGLOBIN A1C  11/10/2022   OPHTHALMOLOGY EXAM  01/23/2023   Diabetic kidney evaluation - Urine ACR  05/13/2023   FOOT EXAM  05/13/2023   Diabetic kidney evaluation - eGFR measurement  08/06/2023   Medicare Annual Wellness (AWV)  08/20/2023   MAMMOGRAM  08/23/2023   DTaP/Tdap/Td (3 - Tdap) 01/02/2025   COLONOSCOPY (Pts 45-484yrInsurance coverage will need to be confirmed)  10/14/2027   Pneumonia Vaccine 6553Years old  Completed   INFLUENZA VACCINE  Completed   DEXA SCAN  Completed   Hepatitis C Screening  Completed   Zoster Vaccines- Shingrix  Completed   HPV VACCINES  Aged Out     Names of Other Physician/Practitioners you currently use: 1.  [facility]- here for primary  care 2. Dr. LyNori Riisphthalmology, eye doctor, last visit 2023 3. Dr. NoMariea Clontsdentist, last visit 2023 q 6 months  Patient Care Team: Unk Pinto, MD as PCP - General (Internal Medicine) Harl Bowie Royetta Crochet, MD as PCP - Cardiology (Cardiology) Star Age, MD as Attending Physician (Neurology) Rozetta Nunnery, MD (Inactive) as Consulting Physician (Otolaryngology) Richmond Campbell, MD as Consulting Physician (Gastroenterology) Erline Levine, MD as Consulting Physician (Neurosurgery) Katy Apo, MD as Consulting Physician (Ophthalmology) Crista Luria, MD as Consulting Physician (Dermatology) Vickey Huger, MD as Consulting Physician (Orthopedic Surgery) Valeta Harms, MD as Referring Physician (Orthopedic Surgery) Schulenklopper, Rozanna Boer, PT as Physical Therapist (Physical Therapy) Carleene Mains, [facility] as Pharmacist (Pharmacist)  Allergies Allergies  Allergen Reactions   Methocarbamol Shortness Of Breath, Nausea Only and Other (See Comments)    Other reaction(s): Dizziness, psychological reaction, disoriented Sleepy and weight loss    Codeine Other (See Comments)    Headache   Statins Other (See Comments)    Extreme pain   Other Other (See Comments)    MINT=sore mouth and stomach upset   Oxycodone     Dizziness, unbalanced.   Amitriptyline Other (See Comments)    Confusion and hallucinations   Erythromycin Base Rash    Flushing/red face.   Fish Oil Nausea Only   Flax Seed [Flax Seed Oil] Rash   Flaxseed (Linseed) Rash   Hydrocodone Itching and Rash        Morphine Nausea And Vomiting and Rash   Nsaids Nausea Only   Nuvigil [Armodafinil] Other (See Comments)    Unspecified   Sertraline Rash   Sinemet [Carbidopa W-Levodopa] Rash   Sulfa Antibiotics Rash   Tolmetin Nausea Only    SURGICAL HISTORY She  has a past surgical history that includes Shoulder adhesion release; Spine surgery; Appendectomy (1960); Cesarean section ([place]);  Abdominal hysterectomy (1992); Rotator cuff repair (Left, 2005); Tonsillectomy (1960); Joint replacement; Toe Surgery; Toe Surgery; Anterior cervical decompression/discectomy fusion 4 level (N/A, 06/14/2015); ORIF femur fracture (Left, 03/24/2017); Knee surgery; Back surgery; Eye surgery; Transforaminal lumbar interbody fusion w/ mis 1 level (Left, 09/05/2021); Drug induced endoscopy (N/A, 02/25/2022); Cataract extraction, bilateral; Implantation of hypoglossal nerve stimulator (Right, 05/29/2022); Finger arthroscopy; and Spinal cord decompression. FAMILY HISTORY Her family history includes Arthritis in her mother; Breast cancer in her mother; Cancer in her mother; Depression in her sister; Diabetes in her mother; Heart disease in her father; Hyperlipidemia in her father; Hypertension in her father; Leukemia in her sister; Lupus in her sister; Migraines in her daughter; Neuropathy in her father; Osteoporosis in her mother; Rheum arthritis in her sister. SOCIAL HISTORY She  reports that she has never smoked. She has been exposed to tobacco smoke. She has never used smokeless tobacco. She reports current alcohol use of about 2.0 standard drinks of alcohol per week. She reports that she does not use drugs.  MEDICARE WELLNESS OBJECTIVES: Physical activity:   Cardiac risk factors:   Depression/mood screen:      08/19/2022    3:46 PM  Depression screen PHQ 2/9  Decreased Interest 0  Down, Depressed, Hopeless 0  PHQ - 2 Score 0    ADLs:     08/19/2022    3:41 PM 05/21/2022    2:24 PM  In your present state of health, do you have any difficulty performing the following activities:  Hearing? 0   Vision? 1   Comment Follows with Dr. Prudencio Burly - has had lasik and plans to get Rx glasses   Difficulty concentrating or making decisions? 0   Walking or climbing stairs? 0   Dressing or bathing? 0  Doing errands, shopping? 0 0  Preparing Food and eating ? N   Using the Toilet? N   In the past six months,  have you accidently leaked urine? N   Do you have problems with loss of bowel control? N   Managing your Medications? N   Managing your Finances? N   Housekeeping or managing your Housekeeping? N      Cognitive Testing  Alert? Yes  Normal Appearance?Yes  Oriented to person? Yes  Place? Yes   Time? Yes  Recall of three objects?  Yes  Can perform simple calculations? Yes  Displays appropriate judgment?Yes  Can read the correct time from a watch face?Yes  EOL planning: Does Patient Have a Medical Advance Directive?: Yes Type of Advance Directive: Living will   Objective:   Today's Vitals   08/19/22 1513  BP: 132/82  Pulse: 98  Temp: (!) 97.2 F (36.2 C)  SpO2: 99%  Weight: 143 lb 6.4 oz (65 kg)  Height: 5' 3.5" (1.613 m)     Body mass index is 25 kg/m.  General Appearance: Well nourished, elderly female, with walker, in no acute distress. Eyes: PERRLA, EOMs, conjunctiva no swelling or erythema. Chronically drooping R eye lid.  Sinuses: No Frontal/maxillary tenderness ENT/Mouth: Ext aud canals clear, TMs without erythema, bulging. No erythema, swelling, or exudate on post pharynx.  Tonsils not swollen or erythematous. Hearing normal.  Neck: Supple, thyroid normal.  Respiratory: Respiratory effort normal, BS equal bilaterally without rales, rhonchi, wheezing or stridor.  Cardio: RRR with no MRGs. Brisk peripheral pulses without edema.  Abdomen: Soft, + BS.  Non tender, no guarding, rebound, hernias, masses. Lymphatics: Non tender without lymphadenopathy.  Musculoskeletal: No obvious or palpable bony abnormality, slow gait with walker, has chronic R knee pain Skin: Warm, dry . She has a 4-5 cm skin tear on left upper arm- antibiotic ointment applied, telfa and coban dressing Neuro:  Normal muscle tone, Sensation intact.  Psych: Awake and oriented X 3, normal affect, Insight and Judgment appropriate.   Medicare Attestation I have personally reviewed: The patient's medical  and social history Their use of alcohol, tobacco or illicit drugs Their current medications and supplements The patient's functional ability including ADLs,fall risks, home safety risks, cognitive, and hearing and visual impairment Diet and physical activities Evidence for depression or mood disorders  The patient's weight, height, BMI, and visual acuity have been recorded in the chart.  I have made referrals, counseling, and provided education to the patient based on review of the above and I have provided the patient with a written personalized care plan for preventive services.     Darrol Jump, NP   08/20/2022

## 2022-08-19 NOTE — Patient Instructions (Signed)

## 2022-08-20 ENCOUNTER — Telehealth: Payer: Self-pay

## 2022-08-20 ENCOUNTER — Encounter: Payer: Self-pay | Admitting: Nurse Practitioner

## 2022-08-20 ENCOUNTER — Other Ambulatory Visit: Payer: Self-pay | Admitting: Nurse Practitioner

## 2022-08-20 DIAGNOSIS — E785 Hyperlipidemia, unspecified: Secondary | ICD-10-CM

## 2022-08-20 MED ORDER — EZETIMIBE 10 MG PO TABS: 10.0000 mg | ORAL_TABLET | Freq: Every day | ORAL | 11 refills | Status: DC

## 2022-08-20 NOTE — Progress Notes (Unsigned)
Patient unable to afford Fenofibrate - states Ezetimibe is cheaper.    Informed patient to stop Fenofibrate once completed and start Ezetimibe.

## 2022-08-20 NOTE — Progress Notes (Signed)
11:00A Patient: Crystal [name], Crystal [name] DOB: October 24, 2050  [facility] reviewing pts chart prior to general review call. Reviewing office visits, consults, hospital visits, lab and medication adherence/changes. HC called pt to complete review call. [place] on cell. Home # came up as "bad number".  Total Time 22mn  9:20AM HC called pt to complete general review call. General review call complete. Pt scheduled for FPO on 08/26/22.

## 2022-08-23 ENCOUNTER — Other Ambulatory Visit: Payer: Self-pay | Admitting: Internal Medicine

## 2022-08-23 DIAGNOSIS — E039 Hypothyroidism, unspecified: Secondary | ICD-10-CM

## 2022-08-25 ENCOUNTER — Telehealth: Payer: Self-pay

## 2022-08-25 NOTE — Progress Notes (Signed)
Date : 08/25/22           Patient Name: Crystal [name], Crystal [name]           DOB: [birth year]    Review office visits, consults, Medication changes and Chronic conditions for Focus Pharmacist Outreach with CPP on 08/26/22.   Confirm visit date: 08/26/22 between 3-5PM Have you seen any providers since your last visit or any hospitalizations? No Have there been any changes to your medications since your last visit? (Med. Adherence, care gaps) Unable to obtain  Have you had any problems with getting your medications from your pharmacy? (Copay barriers) Pt will discuss What is your top health concern you'd like to discuss for your upcoming visit? Pt will discuss HC: Last Care Plan date: **needs updated care plan   Total time spent 10 min

## 2022-08-26 ENCOUNTER — Telehealth: Payer: Self-pay

## 2022-08-26 ENCOUNTER — Telehealth: Payer: Self-pay | Admitting: Pharmacist

## 2022-08-26 NOTE — Telephone Encounter (Signed)
Focused Pharmacist Outreach    Medication Updates: CBD Gummy - 1 at bedtime; Prednisone taper, stopped fenofibrate and started Zetia '10mg'$ .  New providers- dentist, dermatologist. Possibly starting [place]. New rheumatologist- Dr. Kathlene November.   Codeine allergy- able to tolerate Oxycontin. Needs pain medication for upcoming surgery and wants to make sure she can get Oxycontin for post op. Advised to discuss with surgeon as decision would lie with prescriber and to inform of what has worked in the past.   CPP Telemedicine Time: 20 min

## 2022-08-28 ENCOUNTER — Ambulatory Visit (INDEPENDENT_AMBULATORY_CARE_PROVIDER_SITE_OTHER): Payer: Medicare PPO | Admitting: Gastroenterology

## 2022-08-28 VITALS — BP 122/72 | HR 97 | Ht 62.0 in | Wt 141.1 lb

## 2022-08-28 DIAGNOSIS — N182 Chronic kidney disease, stage 2 (mild): Secondary | ICD-10-CM | POA: Diagnosis not present

## 2022-08-28 DIAGNOSIS — I10 Essential (primary) hypertension: Secondary | ICD-10-CM | POA: Diagnosis not present

## 2022-08-28 DIAGNOSIS — K50018 Crohn's disease of small intestine with other complication: Secondary | ICD-10-CM

## 2022-08-28 DIAGNOSIS — L439 Lichen planus, unspecified: Secondary | ICD-10-CM

## 2022-08-28 DIAGNOSIS — E1169 Type 2 diabetes mellitus with other specified complication: Secondary | ICD-10-CM | POA: Diagnosis not present

## 2022-08-28 DIAGNOSIS — E785 Hyperlipidemia, unspecified: Secondary | ICD-10-CM | POA: Diagnosis not present

## 2022-08-28 NOTE — Progress Notes (Signed)
HPI :  72 year old female here for a follow up visit for Crohn's disease of the ileum.  Recall she also has history of lichen planus followed by dermatology at [facility], history of arthritis followed by rheumatology She was last seen in January, see that clinic note for full details.  She was previously followed by Dr. Earlean Shawl for Crohn's disease, we have just recently assumed her care  Crohn's history Dx Crohn's ileitis back in 2019 or so, however she has had ileitis noted on colonoscopies dating back for years before that however biopsies never confirmed the diagnosis. She was on Humira for a few years after her initial diagnosis.  From review of the record it was a question of she was having multiple falls due to demyelination?  However there was continued despite that.  She had multiple orthopedic surgeries in 2021 2022, she had an infection from a knee replacement, had been off Humira for several months and then ultimately just stopped it She has been off Humira now for a few years.  She has not been on any other medication for her Crohn's disease.  She has never had a surgery for Crohn's disease or hospitalization from this.  SINCE LAST VISIT  Since her last visit was done she had a CT enterography study to restage her Crohn's disease.  She has active inflammation in her ileum along with some suggestion of fibrotic disease as well.  She had a fecal calprotectin that was over 1000.  Recall she has been followed by [facility] dermatology for lichen planus in her mouth.  She previously was on hydroxychloroquine but at the time of her last visit was transitioned to azathioprine.  She was on 100 mg daily but since the last visit had stopped that due to intolerance.  She states she felt fatigue and achiness all over and did not tolerate it.  She is currently not on anything for the lichen planus, she is due to follow-up with dermatology in the next few months.  She states she was seen by her rheumatologist and  given a short burst of prednisone for her arthritic pains.  She notes baseline constipation for which we discussed options at the last visit, she has been using MiraLAX which is helping her constipation.  She does have intermittent abdominal pains that bother her but no nausea or vomiting.  No obstructive symptoms.  Pain can be intermittent.  She states her stomach did feel better on the prednisone.  She has chronic pain in her back and joints.  She previously was on narcotics but now is off all narcotics.  She takes Cymbalta 60 mg daily, Ultram as needed but states she does not use it that frequently.  She is also on gabapentin.  She denies using any NSAIDs routinely.  She has been using CBD Gummies to sleep and turmeric as needed as well.   Her last colonoscopy was in 2019, she had ileitis with 1 small adenoma.  Also had an MR enterography in 2016.     She has had labs in January showing normal CBC and CMET, no anemia She has been evaluated for tuberculosis and viral hepatitis with dermatology at [facility] in November 2023 with negative testing.   Colonoscopy 2019 - finding of chronic active ileitis with ulceration and a tubular adenoma.    Review of her medical chart, found earlier ulcerative inflammation of the terminal ileum, at colonoscopy 2010 and 2014. Biospies confirmed active inflammation, but granulomas have not been found. Small bowel  series confirmed ileal inflammation, consistent with Crohn's enteritis of the terminal ileum.    MRE 07/2014: IMPRESSION:   1. Long segment of bowel wall thickening, mucosal enhancement and luminal narrowing involving 20 cm of the distal ileum. No evidence of bowel obstruction or proximal dilatation. No fecalization. The inflammation is present on comparison exam but the narrowing has progressed. 2. No fisutala or abscess.     Echocardiogram 02/28/2022 - EF 55-60%,    CT enterography 07/30/22: IMPRESSION: 1. Persistent long segment distal and terminal ileal  wall thickening, consistent with the clinical history of Crohn disease. The hyperenhancing terminal ileal component is similar and the extent of mucosal hyperenhancement within the distal most ileum is decreased compared to 08/25/2014 MR enterography. Upstream dilatation is new/increased, consistent with a component of obstruction/stenosis. 2. No other complication and no new sites of active inflammation identified. 3.  Aortic Atherosclerosis (ICD10-I70.0).   Fecal calprotectin 07/15/22 - 1060   Past Medical History:  Diagnosis Date   Arthritis    ASCVD (arteriosclerotic cardiovascular disease)    Closed nondisplaced subtrochanteric fracture of left femur ([place]) 09/03/2017   September 2018.  Status post surgery by Dr. Marcelino Scot   Crohn's disease of ileum without complication ([place])    DDD (degenerative disc disease), lumbar    Depression    Dyspnea    Eye infection    Fibromyalgia    Gait disorder 10/26/2012   GERD (gastroesophageal reflux disease)    GI bleed    Hyperlipidemia    Hypertension    Hypothyroid    Periprosthetic supracondylar fracture of femur, left  03/23/2017   Pneumonia    Pre-diabetes    Restless leg syndrome    Rhinitis 06/25/2010   Sleep apnea    does not use CPAP   Spinal headache    with the C section   TIA (transient ischemic attack)    3         Vitamin D deficiency      Past Surgical History:  Procedure Laterality Date   ABDOMINAL HYSTERECTOMY  1992   ANTERIOR CERVICAL DECOMPRESSION/DISCECTOMY FUSION 4 LEVELS N/A 06/14/2015   Procedure: Cervical three-four Cervical four-five Cervical five- six Cervical six- seven  Anterior cervical decompression/diskectomy/fusion;  Surgeon: Erline Levine, MD;  Location: [place];  Service: Neurosurgery;  Laterality: N/A;  C3-4 C4-5 C5-6 C6-7 Anterior cervical decompression/diskectomy/fusion   APPENDECTOMY  1960   BACK SURGERY     CATARACT EXTRACTION, BILATERAL     CESAREAN SECTION  1983 & 1886   DRUG  INDUCED ENDOSCOPY N/A 02/25/2022   Procedure: DRUG INDUCED SLEEP ENDOSCOPY;  Surgeon: Jerrell Belfast, MD;  Location: [facility];  Service: ENT;  Laterality: N/A;   EYE SURGERY     bilateral cataracts   FINGER ARTHROSCOPY     IMPLANTATION OF HYPOGLOSSAL NERVE STIMULATOR Right 05/29/2022   Procedure: IMPLANTATION OF HYPOGLOSSAL NERVE STIMULATOR;  Surgeon: Jerrell Belfast, MD;  Location: [facility];  Service: ENT;  Laterality: Right;   JOINT REPLACEMENT     2   LEFT  1 RIGHT    KNEE SURGERY     ORIF FEMUR FRACTURE Left 03/24/2017   Procedure: OPEN REDUCTION INTERNAL FIXATION (ORIF) FEMUR FRACTURE;  Surgeon: Altamese Springdale, MD;  Location: [facility];  Service: Orthopedics;  Laterality: Left;   ROTATOR CUFF REPAIR Left 2005   2 LEFT  1  RIGHT   SHOULDER ADHESION RELEASE     SPINAL CORD DECOMPRESSION     SPINE SURGERY  TOE SURGERY     LEFT        TOE SURGERY     RIGHT  BIG TOE   TONSILLECTOMY  1960   TRANSFORAMINAL LUMBAR INTERBODY FUSION W/ MIS 1 LEVEL Left 09/05/2021   Procedure: MINIMALLY INVASIVE TRANSFORAMINAL LUMBAR INTERBODY FUSION LUMBAR THREE-FOUR; EXPLORE FUSION WITH REVISION OF HARDWARE LUMBAR FOUR-FIVE,LEFT SIDE APPROACH;  Surgeon: Karsten Ro, DO;  Location: [facility];  Service: Neurosurgery;  Laterality: Left;   Family History  Problem Relation Age of Onset   Cancer Mother    Diabetes Mother    Osteoporosis Mother    Breast cancer Mother    Arthritis Mother    Heart disease Father    Hypertension Father    Hyperlipidemia Father    Neuropathy Father    Leukemia Sister    Lupus Sister    Depression Sister    Rheum arthritis Sister    Migraines Daughter    Liver disease Neg Hx    Colon cancer Neg Hx    Esophageal cancer Neg Hx    Social History   Tobacco Use   Smoking status: Never    Passive exposure: Past   Smokeless tobacco: Never  Vaping Use   Vaping Use: Never used  Substance Use Topics   Alcohol use: Yes    Alcohol/week: 2.0 standard drinks  of alcohol    Types: 2 Standard drinks or equivalent per week    Comment: social   Drug use: No   Current Outpatient Medications  Medication Sig Dispense Refill   AMBULATORY NON FORMULARY MEDICATION CBD gummy at bedtime     benzonatate (TESSALON) 200 MG capsule TAKE 1 CAPSULE (PERLE) BY MOUTH 3 X /DAY ONLY IF NEEDED FOR SEVERE COUGH 30 capsule 0   BIOTIN PO Take by mouth.     Black Pepper-Turmeric (TURMERIC PLUS BLACK PEPPER EXT PO) Take by mouth.     Calcium Carbonate (CALCIUM 600 PO) Take by mouth.     carboxymethylcellulose (REFRESH PLUS) 0.5 % SOLN Place 1 drop into both eyes 3 (three) times daily as needed (dry eyes).     CINNAMON PO Take by mouth.     clobetasol ointment (TEMOVATE) [id number] % Apply 1 Application topically 3 (three) times daily as needed (lichen planus).     clotrimazole (MYCELEX) 10 MG troche Take 10 mg by mouth 3 (three) times daily.     dexamethasone (DECADRON) 0.5 MG/5ML solution Take 0.5 mg by mouth 3 (three) times daily.     diclofenac Sodium (VOLTAREN) 1 % GEL Apply 2 g topically daily as needed (pain).     docusate sodium (COLACE) 100 MG capsule Take 100 mg by mouth daily as needed for mild constipation or moderate constipation.     DULoxetine (CYMBALTA) 60 MG capsule Take 60 mg by mouth daily.     ezetimibe (ZETIA) 10 MG tablet Take 1 tablet (10 mg total) by mouth daily. 30 tablet 11   fenofibrate micronized (LOFIBRA) 134 MG capsule TAKE 1 CAPSULE EVERY DAY BEFORE BREAKFAST 90 capsule 3   Ferrous Sulfate (IRON PO) Take 25 mg by mouth daily.     fluticasone (FLONASE) 50 MCG/ACT nasal spray USE TWO SPRAYS IN EACH NOSTRIL DAILY AS NEEDED (Patient taking differently: Place 2 sprays into both nostrils at bedtime.) 48 g 3   gabapentin (NEURONTIN) 300 MG capsule Take 1 capsule 4 x /day for Chronic Pain         ( Dx: m79.7)                                               /  TAKE                                   BY                                   MOUTH 360 capsule 3   levothyroxine (SYNTHROID) 88 MCG tablet Take  1 tablet  Daily  on an empty stomach with only water for 30 minutes & no Antacid meds, Calcium or Magnesium for 4 hours & avoid Biotin 90 tablet 3   meclizine (ANTIVERT) 25 MG tablet TAKE 1 TABLET (25 MG TOTAL) BY MOUTH 3 (THREE) TIMES DAILY AS NEEDED FOR DIZZINESS. 90 tablet 0   mupirocin ointment (BACTROBAN) 2 % Apply 1 Application topically 3 (three) times daily as needed (lichen planus).     neomycin-polymyxin-hydrocortisone (CORTISPORIN) OTIC solution Place 3 drops into both ears daily as needed (ear inflammation).     omeprazole (PRILOSEC) 40 MG capsule Take 1 capsule (40 mg total) by mouth daily. 90 capsule 1   OVER THE COUNTER MEDICATION Place 1 drop under the tongue daily. D3 5000 units - K2 120 mcg     polyethylene glycol (MIRALAX / GLYCOLAX) 17 g packet Take 17-34 g by mouth at bedtime.     predniSONE (DELTASONE) 5 MG tablet PLEASE SEE ATTACHED FOR DETAILED DIRECTIONS     rOPINIRole (REQUIP) 2 MG tablet Take  1 tablet  2 x / day  &  2 tablets  at Bedtime  for Restless Legs 360 tablet 3   tiZANidine (ZANAFLEX) 2 MG tablet Take 2 mg by mouth at bedtime.     traMADol (ULTRAM) 50 MG tablet Take 50 mg by mouth every 6 (six) hours as needed for moderate pain or severe pain.     Vitamin D-Vitamin K (VITAMIN D2 + K1) 20-120 MCG/0.1ML LIQD Take by mouth.     No current facility-administered medications for this visit.   Allergies  Allergen Reactions   Methocarbamol Shortness Of Breath, Nausea Only and Other (See Comments)    Other reaction(s): Dizziness, psychological reaction, disoriented Sleepy and weight loss    Codeine Other (See Comments)    Headache   Statins Other (See Comments)    Extreme pain   Azathioprine    Other Other (See Comments)    MINT=sore mouth and stomach upset   Oxycodone     Dizziness, unbalanced.   Amitriptyline Other (See Comments)    Confusion and hallucinations   Erythromycin Base Rash     Flushing/red face.   Fish Oil Nausea Only   Flax Seed [Flax Seed Oil] Rash   Flaxseed (Linseed) Rash   Hydrocodone Itching and Rash        Morphine Nausea And Vomiting and Rash   Nsaids Nausea Only   Nuvigil [Armodafinil] Other (See Comments)    Unspecified   Sertraline Rash   Sinemet [Carbidopa W-Levodopa] Rash   Sulfa Antibiotics Rash   Tolmetin Nausea Only     Review of Systems: All systems reviewed and negative except where noted in HPI.   Labs reviewed in Epic / care everywhere  Physical Exam: BP 122/72   Pulse 97   Ht '5\' 2"'$  (1.575 m)   Wt 141 lb 2 oz (64 kg)   SpO2 94%   BMI 25.81 kg/m  Constitutional: Pleasant,well-developed, female in no acute distress. Neurological:  Alert and oriented to person place and time. Psychiatric: Normal mood and affect. Behavior is normal.   ASSESSMENT: 72 y.o. female here for assessment of the following  1. Crohn's disease of small intestine with other complication ([place])   2. Lichen planus    Longstanding Crohn's disease, history of Humira use but has been off for a few years.  Also followed by [facility] dermatology for lichen planus for which she has been on hydroxychloroquine and most recently azathioprine which she has not tolerated.  She has some baseline constipation and intermittent abdominal pain, fortunately no surgeries or hospitalizations for this over years.  I reviewed her CT enterography with her and fecal calprotectin.  She has active inflammation of her small intestine with some suggestion of fibrotic disease at this point.  At risk for bowel obstruction/complications of Crohn's without therapy.  She does have some intermittent postprandial pain at baseline.  We discussed options, I do recommend treatment for her Crohn's disease given her other symptoms, perhaps her joint pains are also related.  She has had some benefit with steroids recently.  She does not think Humira ever provided too much benefit for her, she would  prefer to not go back on that.  I discussed options for her.  I think Orson Ape may be a pretty good option for her but would need to talk with her dermatologist to see if this would be acceptable for her lichen planus.  Other option would be Entyvio, Stelara, Remicade etc.  We discussed risks and benefits of each of these.  Weyman Rodney has the most favorable side effect profile however I think the other options may have better benefit for her lichen planus, although need to confirm with dermatology.  I will reach out to them and once I hear back I will contact the patient.  She has tested negative for TB and viral hepatitis studies already.  In the interim recommend a low residual diet to minimize symptoms.  I offered her budesonide course to treat her small intestine inflammation if she is having symptoms of bother her, she wants to hold off on this if possible prefer to just go straight to biologic if it is not too long of a wait.  In the interim continue MiraLAX for her constipation and titrate as needed.  Her vaccines are otherwise up-to-date.  PLAN: - discussed options for biologic therapy with the patient.  Preference will be [name] as above, I will need to call dermatology to see if this would be acceptable for them in light of her lichen planus or what they would recommend in regards to treating both of these disorders. Dermatologist is Dr. Octavio Graves of [facility]. Negative TB and viral hep studies already - offered a course of budesonide for now, she wants to hold off - low residual diet - handout given - continue Miralax daily and titrate as needed - f/u plans pending timing of starting biologic therapy  Jolly Mango, MD [facility]

## 2022-08-28 NOTE — Patient Instructions (Addendum)
We will mail you a low residual diet hand out to follow. Please continue Miralax daily  We will be in touch regarding next steps once we discuss your case with dermatology  _______________________________________________________  If your blood pressure at your visit was 140/90 or greater, please contact your primary care physician to follow up on this.  _______________________________________________________  If you are age 74 or older, your body mass index should be between 23-30. Your There is no height or weight on file to calculate BMI. If this is out of the aforementioned range listed, please consider follow up with your Primary Care Provider.  If you are age 65 or younger, your body mass index should be between 19-25. Your There is no height or weight on file to calculate BMI. If this is out of the aformentioned range listed, please consider follow up with your Primary Care Provider.   ________________________________________________________  The [facility] providers would like to encourage you to use [facility] to communicate with providers for non-urgent requests or questions.  Due to long hold times on the telephone, sending your provider a message by [facility] may be a faster and more efficient way to get a response.  Please allow 48 business hours for a response.  Please remember that this is for non-urgent requests.  _______________________________________________________ It was a pleasure to see you today!  Thank you for trusting me with your gastrointestinal care!

## 2022-08-28 NOTE — Telephone Encounter (Signed)
error 

## 2022-08-29 ENCOUNTER — Telehealth: Payer: Self-pay | Admitting: Gastroenterology

## 2022-08-29 ENCOUNTER — Other Ambulatory Visit: Payer: Self-pay | Admitting: Gastroenterology

## 2022-08-29 NOTE — Telephone Encounter (Signed)
I called and spoke with this patient's dermatologist, Dr. Baxter Kail from [facility] about her case. We discussed options to treat her Crohn's. From their perspective, the lichen planus, they would be okay with [name] or Rinvoq, both could potentially help the lichen planus. I would prefer to try Orson Ape if covered by insurance. I will place orders for that to the pharmacy to see if covered by her insurance.  [name] can you let the patient know that I spoke with Dr. Will Bonnet from [facility]. She is okay with [place] trying to put her on [name] if she is okay with it. I will order it to the infusion center and see if they can get approval from her insurance company. Thank you

## 2022-09-01 NOTE — Telephone Encounter (Signed)
Called pt.  Relayed results and recommendations.  Patient expressed understanding.

## 2022-09-03 ENCOUNTER — Telehealth (INDEPENDENT_AMBULATORY_CARE_PROVIDER_SITE_OTHER): Payer: Medicare PPO | Admitting: Pulmonary Disease

## 2022-09-03 ENCOUNTER — Encounter (HOSPITAL_BASED_OUTPATIENT_CLINIC_OR_DEPARTMENT_OTHER): Payer: Self-pay | Admitting: Pulmonary Disease

## 2022-09-03 DIAGNOSIS — G4733 Obstructive sleep apnea (adult) (pediatric): Secondary | ICD-10-CM | POA: Diagnosis not present

## 2022-09-03 NOTE — Patient Instructions (Signed)
Follow up in office in 6 weeks

## 2022-09-03 NOTE — Progress Notes (Signed)
[facility]  Chief Complaint  Patient presents with   Follow-up    Inspire     Past Surgical History:  She  has a past surgical history that includes Shoulder adhesion release; Spine surgery; Appendectomy (1960); Cesarean section ([place]); Abdominal hysterectomy (1992); Rotator cuff repair (Left, 2005); Tonsillectomy (1960); Joint replacement; Toe Surgery; Toe Surgery; Anterior cervical decompression/discectomy fusion 4 level (N/A, 06/14/2015); ORIF femur fracture (Left, 03/24/2017); Knee surgery; Back surgery; Eye surgery; Transforaminal lumbar interbody fusion w/ mis 1 level (Left, 09/05/2021); Drug induced endoscopy (N/A, 02/25/2022); Cataract extraction, bilateral; Implantation of hypoglossal nerve stimulator (Right, 05/29/2022); Finger arthroscopy; and Spinal cord decompression.  Past Medical History:  Vit D deficiency, TIA, RLS, Hypothyroidism, HTN, HLD, GERD, Fibromyalgia, Depression, CAD, Crohn's disease, Bell's palsy  Constitutional:  There were no vitals taken for this visit.  Brief Summary:  Crystal [name] is a 72 y.o. female with obstructive sleep apnea, and bronchiectasis in the setting of reflux.      Subjective:  Virtual Visit via Video Note  I connected with Crystal [name] on 09/03/22 at 11:00 AM EST by a video enabled telemedicine application and verified that I am speaking with the correct person using two identifiers.  Location: Patient: home Provider: medical office   I discussed the limitations of evaluation and management by telemedicine and the availability of in person appointments. The patient expressed understanding and agreed to proceed.  History of Present Illness:  She was on prednisone recently by her neurologist.  She had more trouble with her sleep and her chest felt heavy when on prednisone.  Better now that she is off prednisone.  Sleeping okay otherwise.  Not having  trouble with speech or swallowing.  Denies neck pain.  Physical Exam:  Rt facial droop, normal tongue movement, speech fluent   Pulmonary testing:  PFT 11/21/15 >> FEV1 2.51 (110%), FEV1% 85%, TLC 4.36 (88%), DLCO 77% PFT 05/16/22 >> FEV1 2.26 (107%), FEV1% 82, TLC 5.73 (116%), DLCO 81%  Chest Imaging:  CT chest 10/01/17 >> 3 mm LLL nodule, mild cylindrical BTX Rt side  Sleep Tests:  HST 12/04/15 >> 30.7, SpO2 low 73% HST 11/06/21 >> AHI 36.5, SpO2 low 73%  Cardiac Tests:  Echo 02/28/22 >> EF 55 to 60%, mild MR  Social History:  She  reports that she has never smoked. She has been exposed to tobacco smoke. She has never used smokeless tobacco. She reports current alcohol use of about 2.0 standard drinks of alcohol per week. She reports that she does not use drugs.  Family History:  Her family history includes Arthritis in her mother; Breast cancer in her mother; Cancer in her mother; Depression in her sister; Diabetes in her mother; Heart disease in her father; Hyperlipidemia in her father; Hypertension in her father; Leukemia in her sister; Lupus in her sister; Migraines in her daughter; Neuropathy in her father; Osteoporosis in her mother; Rheum arthritis in her sister.     Assessment/Plan:   Obstructive sleep apnea. - intolerant of CPAP - had Inspire implanted in November 2023 - device activated 07/18/22 - will need in person follow up in 6 weeks, and if okay would then need in lab sleep study  Regional bronchiectasis in setting of reflux. - prn antitussive and expectorants   Time Spent Involved in Patient Care on Day of Examination:   I discussed the assessment and treatment plan with the patient. The patient was provided an opportunity to ask  questions and all were answered. The patient agreed with the plan and demonstrated an understanding of the instructions.   The patient was advised to call back or seek an in-person evaluation if the symptoms worsen or if the condition  fails to improve as anticipated.  I provided 26 minutes of non-face-to-face time during this encounter.   Follow up:   Patient Instructions  Follow up in office in 6 weeks  Medication List:   Allergies as of 09/03/2022       Reactions   Methocarbamol Shortness Of Breath, Nausea Only, Other (See Comments)   Other reaction(s): Dizziness, psychological reaction, disoriented Sleepy and weight loss   Codeine Other (See Comments)   Headache   Statins Other (See Comments)   Extreme pain   Azathioprine    Other Other (See Comments)   MINT=sore mouth and stomach upset   Oxycodone    Dizziness, unbalanced.   Amitriptyline Other (See Comments)   Confusion and hallucinations   Erythromycin Base Rash   Flushing/red face.   Fish Oil Nausea Only   Flax Seed [flax Seed Oil] Rash   Flaxseed (linseed) Rash   Hydrocodone Itching, Rash      Morphine Nausea And Vomiting, Rash   Nsaids Nausea Only   Nuvigil [armodafinil] Other (See Comments)   Unspecified   Sertraline Rash   Sinemet [carbidopa W-levodopa] Rash   Sulfa Antibiotics Rash   Tolmetin Nausea Only        Medication List        Accurate as of September 03, 2022 12:02 PM. If you have any questions, ask your nurse or doctor.          AMBULATORY NON FORMULARY MEDICATION CBD gummy at bedtime   benzonatate 200 MG capsule Commonly known as: TESSALON TAKE 1 CAPSULE (PERLE) BY MOUTH 3 X /DAY ONLY IF NEEDED FOR SEVERE COUGH   BIOTIN PO Take by mouth.   CALCIUM 600 PO Take by mouth.   carboxymethylcellulose 0.5 % Soln Commonly known as: REFRESH PLUS Place 1 drop into both eyes 3 (three) times daily as needed (dry eyes).   CINNAMON PO Take by mouth.   clobetasol ointment 0.05 % Commonly known as: TEMOVATE Apply 1 Application topically 3 (three) times daily as needed (lichen planus).   clotrimazole 10 MG troche Commonly known as: MYCELEX Take 10 mg by mouth 3 (three) times daily.   dexamethasone 0.5 MG/5ML  solution Commonly known as: DECADRON Take 0.5 mg by mouth 3 (three) times daily.   diclofenac Sodium 1 % Gel Commonly known as: VOLTAREN Apply 2 g topically daily as needed (pain).   docusate sodium 100 MG capsule Commonly known as: COLACE Take 100 mg by mouth daily as needed for mild constipation or moderate constipation.   DULoxetine 60 MG capsule Commonly known as: CYMBALTA Take 60 mg by mouth daily.   ezetimibe 10 MG tablet Commonly known as: Zetia Take 1 tablet (10 mg total) by mouth daily.   fenofibrate micronized 134 MG capsule Commonly known as: LOFIBRA TAKE 1 CAPSULE EVERY DAY BEFORE BREAKFAST   fluticasone 50 MCG/ACT nasal spray Commonly known as: FLONASE USE TWO SPRAYS IN EACH NOSTRIL DAILY AS NEEDED What changed:  how much to take how to take this when to take this additional instructions   gabapentin 300 MG capsule Commonly known as: NEURONTIN Take 1 capsule 4 x /day for Chronic Pain         ( Dx: m79.7)                                               /  TAKE                                   BY                                  MOUTH   IRON PO Take 25 mg by mouth daily.   levothyroxine 88 MCG tablet Commonly known as: Synthroid Take  1 tablet  Daily  on an empty stomach with only water for 30 minutes & no Antacid meds, Calcium or Magnesium for 4 hours & avoid Biotin   meclizine 25 MG tablet Commonly known as: ANTIVERT TAKE 1 TABLET (25 MG TOTAL) BY MOUTH 3 (THREE) TIMES DAILY AS NEEDED FOR DIZZINESS.   mupirocin ointment 2 % Commonly known as: BACTROBAN Apply 1 Application topically 3 (three) times daily as needed (lichen planus).   neomycin-polymyxin-hydrocortisone OTIC solution Commonly known as: CORTISPORIN Place 3 drops into both ears daily as needed (ear inflammation).   omeprazole 40 MG capsule Commonly known as: PRILOSEC Take 1 capsule (40 mg total) by mouth daily.   OVER THE COUNTER MEDICATION Place 1 drop  under the tongue daily. D3 5000 units - K2 120 mcg   polyethylene glycol 17 g packet Commonly known as: MIRALAX / GLYCOLAX Take 17-34 g by mouth at bedtime.   predniSONE 5 MG tablet Commonly known as: DELTASONE PLEASE SEE ATTACHED FOR DETAILED DIRECTIONS   rOPINIRole 2 MG tablet Commonly known as: REQUIP Take  1 tablet  2 x / day  &  2 tablets  at Bedtime  for Restless Legs   tiZANidine 2 MG tablet Commonly known as: ZANAFLEX Take 2 mg by mouth at bedtime.   traMADol 50 MG tablet Commonly known as: ULTRAM Take 50 mg by mouth every 6 (six) hours as needed for moderate pain or severe pain.   TURMERIC PLUS BLACK PEPPER EXT PO Take by mouth.   Vitamin D2 + K1 20-120 MCG/0.1ML Liqd Generic drug: Vitamin D-Vitamin K Take by mouth.        Signature:  [name], MD [facility] Pager - [phone] 09/03/2022, 12:02 PM

## 2022-09-08 DIAGNOSIS — Z1231 Encounter for screening mammogram for malignant neoplasm of breast: Secondary | ICD-10-CM | POA: Diagnosis not present

## 2022-09-08 DIAGNOSIS — Z6825 Body mass index (BMI) 25.0-25.9, adult: Secondary | ICD-10-CM | POA: Diagnosis not present

## 2022-09-08 DIAGNOSIS — M5416 Radiculopathy, lumbar region: Secondary | ICD-10-CM | POA: Diagnosis not present

## 2022-09-08 LAB — HM MAMMOGRAPHY

## 2022-09-09 ENCOUNTER — Telehealth: Payer: Self-pay | Admitting: Pharmacy Technician

## 2022-09-09 DIAGNOSIS — M48062 Spinal stenosis, lumbar region with neurogenic claudication: Secondary | ICD-10-CM | POA: Diagnosis not present

## 2022-09-09 DIAGNOSIS — R531 Weakness: Secondary | ICD-10-CM | POA: Diagnosis not present

## 2022-09-09 DIAGNOSIS — M79604 Pain in right leg: Secondary | ICD-10-CM | POA: Diagnosis not present

## 2022-09-09 DIAGNOSIS — R260 Ataxic gait: Secondary | ICD-10-CM | POA: Diagnosis not present

## 2022-09-09 NOTE — Telephone Encounter (Signed)
Wonderful, thanks for letting me know.

## 2022-09-09 NOTE — Telephone Encounter (Signed)
Dr. Havery Moros, Juluis Rainier note:  Auth Submission: APPROVED Payer: Mcarthur Rossetti MEDICARE Medication & CPT/J Code(s) submitted: Orson Ape [employer]son [employer]) [phone] Route of submission (phone, fax, portal):  Phone [phone] Fax # [phone] Auth type: Buy/Bill Units/visits requested: 3 Reference number: [id number] Approval from: 09/03/22 to 06/30/23   Patient will be scheduled as soon as possible  '@[facility]'$ , Please submit auth for [place] (pharmacy benefit)

## 2022-09-10 ENCOUNTER — Encounter: Payer: Self-pay | Admitting: Internal Medicine

## 2022-09-10 DIAGNOSIS — M79643 Pain in unspecified hand: Secondary | ICD-10-CM | POA: Diagnosis not present

## 2022-09-10 DIAGNOSIS — K5090 Crohn's disease, unspecified, without complications: Secondary | ICD-10-CM | POA: Diagnosis not present

## 2022-09-10 DIAGNOSIS — M7989 Other specified soft tissue disorders: Secondary | ICD-10-CM | POA: Diagnosis not present

## 2022-09-10 DIAGNOSIS — M2550 Pain in unspecified joint: Secondary | ICD-10-CM | POA: Diagnosis not present

## 2022-09-10 DIAGNOSIS — M1990 Unspecified osteoarthritis, unspecified site: Secondary | ICD-10-CM | POA: Diagnosis not present

## 2022-09-10 DIAGNOSIS — L439 Lichen planus, unspecified: Secondary | ICD-10-CM | POA: Diagnosis not present

## 2022-09-10 DIAGNOSIS — M797 Fibromyalgia: Secondary | ICD-10-CM | POA: Diagnosis not present

## 2022-09-18 ENCOUNTER — Telehealth: Payer: Self-pay

## 2022-09-18 NOTE — Progress Notes (Signed)
Date : 09/18/22               Patient Name: Crystal [name]                    DOB: [birth year]   Review office visits, consults, Medication changes and Chronic conditions for Focus Pharmacist Outreach with CPP on (Date and Time)   Confirm visit date: Have you seen any providers since your last visit or any hospitalizations? 08/28/22 Dr. Havery [name] (GI) Stop: Acetaminophen, Guaifenesin, Hydroxychloroquine, Linaclotide, Tobramycin. 09/03/22 Dr. Halford [name] (Pulm) no med changes. 09/08/22 Dr. Vertell [name] (Neuro) unable to note any med changes. 09/09/22 [name] approved 09/10/22 Dr. Kathlene [name] (Rhuem) unable to note med changes Have there been any changes to your medications since your last visit? (Med. Adherence, care gaps) Unable to obtain RX Hx. Have you had any problems with getting your medications from your pharmacy? (Copay barriers) What is your top health concern you'd like to discuss for your upcoming visit? HC: Last Care Plan date:  Needs updated care plan  Paused 10:30 1106AM HC called pt to reschedule FPO to 10/06/22. Pt unavailable, please call tomorrow.  Total time spent 75min  09/19/22 9:38AM HC calling pt to confirm 10/06/22 would be a good day for FPO. FPO scheduled 10/06/22. Total time spent: 29min

## 2022-10-02 ENCOUNTER — Telehealth: Payer: Self-pay

## 2022-10-02 DIAGNOSIS — R260 Ataxic gait: Secondary | ICD-10-CM | POA: Diagnosis not present

## 2022-10-02 DIAGNOSIS — M79604 Pain in right leg: Secondary | ICD-10-CM | POA: Diagnosis not present

## 2022-10-02 DIAGNOSIS — R531 Weakness: Secondary | ICD-10-CM | POA: Diagnosis not present

## 2022-10-02 DIAGNOSIS — M542 Cervicalgia: Secondary | ICD-10-CM | POA: Diagnosis not present

## 2022-10-02 NOTE — Progress Notes (Signed)
Date : 10/02/22 Patient Name: Crystal [name], Crystal [name]                                   DOB: [birth year]   Review office visits, consults, Medication changes and Chronic conditions for Focus Pharmacist Outreach with CPP on 10/06/22.   Confirm visit date: Have you seen any providers since your last visit or any hospitalizations?  08/28/22 Dr. Havery Moros (GI) Stop: Acetaminophen,Guaifenesin, Hydroxychloroquine, Linaclotide, Tobramycin.  09/03/22 Dr. Halford Chessman (Pulm) no med changes.  09/08/22 Dr. Vertell Limber (Neuro) unable to note any med changes.  09/09/22 [name] approved  09/10/22 Dr. Kathlene November (Rhuem) unable to note med changes Have there been any changes to your medications since your last visit? Unable to obtain RX Hx. Have you had any problems with getting your medications from your pharmacy?  What is your top health concern you'd like to discuss for your upcoming visit? HC: Last Care Plan date:  Needs updated care plan  10/02/22 [facility] called pt to confirm FPO on 10/06/22. Left detailed message re: visit.  Total time spent: 80min

## 2022-10-03 ENCOUNTER — Telehealth: Payer: Self-pay | Admitting: Gastroenterology

## 2022-10-03 NOTE — Telephone Encounter (Signed)
Inbound call from patient is requesting to speak with a nurse in regards, to [employer]. Stated she was supposed to be scheduled for infusions but never got a follow up. Also is requesting to discuss Pantoprazole medication. Please advise.

## 2022-10-03 NOTE — Telephone Encounter (Signed)
Returned call to patient. I informed patient that I am not sure why she was not contacted to schedule [name] infusions, her insurance approved this on 09/09/22. Pt states that if the infusion center called from an unknown number she blocks those. I provided patient with the phone number to the infusion center so that she can be scheduled at her convenience. Pt states that her ENT provider ordered a barium esophogram and it demonstrated mild reflux. Patient is taking Omeprazole 40 mg once a day but is still having reflux symptoms. Pt wants to know if she can increase to Omeprazole 40 mg BID. I informed pt that 40 mg BID is the max dose that she can take so I will check with Dr. Adela Lank and see how he wishes to proceed. Pt is aware that Dr. Adela Lank is out of the office today and returns on Monday. Pt verbalized understanding and had no concerns at the end of the call.  [place], it looks like a telephone encounter was sent on 09/09/22 requesting initiation of authorization for [facility] pharmacy benefit. I did not see any further documentation regarding this PA. Will you please work on this? Thank you.

## 2022-10-05 NOTE — Telephone Encounter (Signed)
Thanks [employer]. Yes she can increase omeprazole to BID dosing for a few weeks to see if that helps her symptoms. Agree otherwise, if infusion center can work on [employer]. Patient needs to answer her phone when called or listen to messages regarding this issue. Thanks

## 2022-10-06 NOTE — Telephone Encounter (Signed)
Pt called back into the office and stated that the only way she will be able to get infusions is through [employer]. Selena Batten, will you or someone else be able to contact this patient for further assistance. I am not sure if she qualifies for [employer] assistance or not. She has several questions regarding payment/billing. Thanks

## 2022-10-06 NOTE — Telephone Encounter (Signed)
Great, thank you for your help with this case. Hopefully she is a candidate for PAP program

## 2022-10-06 NOTE — Telephone Encounter (Signed)
[name], Unfortunately she will not qualify for the co-pay card due to she does not have Nurse, learning disability.   We can explore the PAP (free drug program). I have spoken with patient in regards to PAP (free drug) and will f/u once I have a response from the patient assistance program.

## 2022-10-06 NOTE — Telephone Encounter (Signed)
Returned call to patient. We reviewed Dr. Lanetta Inch recommendations for increasing Omeprazole 40 mg to BID for a few weeks to see if that helps symptoms at all. I informed patient that the infusion clinic is a freestanding clinic. I advised pt to call the infusion center back to discuss pricing and co-pay. Pt states that she has 2 insurances. I informed patient that the other option would be to transfer her order to [facility] infusion clinic. Pt also wanted to know if her pulmonologist could order the [facility] and I told pt no. Pt will contact infusion center for guidance and for assistance with questions regarding billing for [name].

## 2022-10-06 NOTE — Telephone Encounter (Signed)
Patient is calling states she is confused on her [name] infusions not sure if its a freestanding clinic, states if it is then she would have to pay $415. Would like to speak with the nurse and get some more clarification. Please advise

## 2022-10-07 ENCOUNTER — Telehealth: Payer: Self-pay | Admitting: Pharmacy Technician

## 2022-10-07 ENCOUNTER — Other Ambulatory Visit (HOSPITAL_COMMUNITY): Payer: Self-pay

## 2022-10-07 ENCOUNTER — Encounter: Payer: Self-pay | Admitting: Gastroenterology

## 2022-10-07 NOTE — Telephone Encounter (Addendum)
[name],  Patient called and would like to be enrolled in the skyrizi (free drug) to assist with financial cost.  I have faxed forms to: [phone]. Please have Dr. Adela Lank sign forms and return to me as soon as possible.   [employer] - free drug Patient has been approve for both IV and OBI Approval date: 11/03/22 - 06/30/23 Id: Phone: [phone]

## 2022-10-07 NOTE — Telephone Encounter (Signed)
PAP form received. Placed in your IN box for signature. Thanks

## 2022-10-07 NOTE — Telephone Encounter (Signed)
I signed it - in my outbox. Thank you! 

## 2022-10-08 NOTE — Telephone Encounter (Signed)
Kim, I faxed the signed form back to you this morning at [phone]. You should receive it shortly. Thanks

## 2022-10-08 NOTE — Telephone Encounter (Signed)
[name], Thanks

## 2022-10-13 ENCOUNTER — Ambulatory Visit: Payer: Self-pay | Admitting: Pharmacist

## 2022-10-13 DIAGNOSIS — M159 Polyosteoarthritis, unspecified: Secondary | ICD-10-CM

## 2022-10-13 DIAGNOSIS — M150 Primary generalized (osteo)arthritis: Secondary | ICD-10-CM

## 2022-10-13 DIAGNOSIS — Z79899 Other long term (current) drug therapy: Secondary | ICD-10-CM

## 2022-10-13 NOTE — Progress Notes (Signed)
Focused Pharmacist Outreach     1. Medication changes: added OsteoMatrix to regimen-4 capsules daily   2. Increase in headaches and feels they are muscle tension driven- is seeing PT next week for dry needling therapy; has TENS unit, recommended ice/heat in 15 min increments, and APAP 1000mg  TID as discussed with Dr. Oneta Rack. Recommended to s/w Archie Patten, NP at upcoming appt in May.   3. Increased daytime drowsiness- denies any change in medications or time of day change in medication; sees [place], NP in May- encouraged to discuss with her and possibly update some labs (pt requests Vit D, TSH). Recommended replacing Biotin with B12 for energy and due to interaction with thyroid lab.      CPP Telemedicine Time: 32 minutes

## 2022-10-14 ENCOUNTER — Ambulatory Visit: Payer: Self-pay | Admitting: Pharmacist

## 2022-10-14 ENCOUNTER — Ambulatory Visit: Payer: Medicare PPO

## 2022-10-14 DIAGNOSIS — I10 Essential (primary) hypertension: Secondary | ICD-10-CM

## 2022-10-14 DIAGNOSIS — Z79899 Other long term (current) drug therapy: Secondary | ICD-10-CM

## 2022-10-14 DIAGNOSIS — M159 Polyosteoarthritis, unspecified: Secondary | ICD-10-CM

## 2022-10-14 DIAGNOSIS — E1169 Type 2 diabetes mellitus with other specified complication: Secondary | ICD-10-CM

## 2022-10-14 NOTE — Progress Notes (Signed)
CP Care Plan  [name],[name] 72 years, Female  DOB: 02/13/1951  M: [phone]  __________________________________________________  General Information Details Chronic Conditions: Gastroesophageal Reflux Disease (GERD), Osteopenia, Hypertension (HTN), Chronic Pain, Fall Risk, Chronic Kidney Disease (CKD), Hyperlipidemia/Dyslipidemia (HLD), Hypothyroidism, Diabetes (DM) Contact your clinical pharmacist and care team with any questions or concerns. Team Phone #:: [phone] Clinical Pharmacist:: Carmelina Dane Coordinating Registered Nurse: [facility] Concierge:: Amber Eaddy  Care Plan High Blood Pressure GOAL: Maintain Blood Pressure less than: 130/80 Your most recent BP is: 132/82 taken on: 08/19/2022 Patient education:: Recommend following the DASH diet, which emphasizes fruits and vegetables and low-fat dairy products along with whole grains, fish, poultry, and nuts. Reduce red meats and sugars., Recommend limiting the daily amount of salt intake to less than /per day or 2/3 teaspoonful a day, Recommend using a salt substitute to replace table salt if extra seasoning/flavor is needed., Discussed ways to increase movement., Discussed increasing exercise (walking, biking, swimming) to a goal of 30 minutes per day, as able based on current activity level and health and/or as directed by your healthcare provider., Educated patient to call PCP office if you develop episodes of low blood pressure, dizziness, falls or increased headaches, and/or swelling of legs and feet. Discussed checking and recording home blood pressure readings: Daily  High Cholesterol GOAL:: Maintain triglycerides less than 150, Maintain LDL (bad) cholesterol less than 70 Your most recent LDL level is: 99 Your most recent triglycerides level is: 182 taken on: 05/13/2023 Patient education:: Discussed increasing exercise (walking, biking, swimming) to a goal of 30 minutes per day, as able based on  current activity level and health and/or as directed by your healthcare provider., Discussed how a diet high in fruits/vegetables/nuts/whole grains/beans may help to reduce your cholesterol. Increasing soluble fiber intake (whole grains, fruits, vegetables, beans, nuts and seeds). Recommended avoiding sugary foods and trans fat, limiting carbohydrates, and reducing portion sizes. Recommended Patient to incorporate more healthy fats (salmon, cold-water fish, almonds, walnuts) into their diet. Your current cholesterol medications are:: ezetimibe - 1 tablet daily Fenofibrate - 1 capsule daily  Diabetes (DM) GOAL: Maintain an A1c (long-term blood sugar control) less than: 7% Your most recent A1C is: 5.5 taken on: 05/13/2023 Patient education:: Instructed patient to check feet daily, and to call with any signs of infection such as redness, fever, swelling, pain, rise of skin temperature, open wounds, drainage, or unusual odor. Instructed patient to wear cushioned closed toe shoes that fit well with thick, padded socks; avoid going barefoot.  Reviewed proper care of nails and calluses., Recommend avoiding both table sugar (ex: regular sodas, juice and sweet tea) and artificially (aspartame, saccharin, sucralose, etc.) sweetened beverages (ex: diet sodas) and increasing water intake., Discussed increasing exercise (walking, biking, swimming) to a goal of 30 minutes per day, as able based on current activity level and health and/or as directed by your healthcare provider., Education provided on importance of regular mealtimes, carbohydrates goals for each meal and snacks, food label reading, portion sizes, and increasing intake of dietary fiber (whole grains, fruits, vegetables, beans, nuts and seeds)., Discussed with patient to watch for signs of low blood sugar (shaking, sweating, fast heart rate, hunger, headache, confusion, drowsiness, or irritability). A blood sugar less than 70 is considered  dangerously low. Eat or drink something with high sugar (4 oz regular soda, 1 tablespoon honey or sugar, hard candy not sugar free, glucose tablets or gel. Avoid high fat foods when blood sugar is low (peanut butter  is not recommended). Wait 15 minutes and check blood sugar. If still below 70, repeat. If above 70, eat a balanced snack or meal. Notify your care team if you are having more than one low blood sugar episode per week., Discussed with patient to watch for signs of high blood sugar (fruity-smelling breath, nausea and vomiting, weakness, confusion, blurred vison, increased urination) and contact your provider if you have having blood sugars above 300 for more than 3 days., Discussed a goal for fasting blood sugar readings to be between 80 and 130, Discussed a goal for after meal (within 2 hours) blood sugar readings to be less than 180, Recommend scheduling a yearly eye exam., Recommend seeing the dentist twice yearly.  Gastroesophageal Reflux Disease (GERD) GOAL:: Reduce and relieve severity and frequency of Gastroesophageal Reflux Disease (GERD) symptoms and prevent long-term complications Patient education:: Recommend elevating the head of the bed by 6-8 inches, avoid eating 2-3 hours before bedtime, and sleeping on the left side to reduce nighttime symptoms., Educated patient that long-term treatment with some medications can increase the risk of for certain vitamin deficiencies and increased risk of fractures., Educated patient to not abruptly stop medication due to the risk of temporarily increased symptoms., Recommend eating small frequent meals and identifying and avoiding trigger foods (alcohol, acidic foods, caffeine, fatty foods, garlic, onion, peppermint, spicy foods) Your current GERD medications are:: Omeprazole 40mg  - 1 capsule daily  Chronic kidney disease (CKD) GOAL:: Reduce the burden of chronic kidney disease and related complications Patient education:: Educated Patient on  importance of blood pressure and blood sugar control to help stop kidney disease from worsening., Recommend avoiding medications that can harm the kidneys, such as a group of medications called NSAIDs, which include ibuprofen (Advil/Motrin) and naproxen (Aleve)., Recommend limiting dietary sodium intake  to less than 1 teaspoonful or 2000 mg per day.  Osteopenia GOAL:: Prevent fractures and disabilities related to osteopenia Patient education:: Educated Patient on home safety and fall prevention, Recommend vitamin D 800 IU once a day to help improve low vitamin D levels and healthy bones., Recommend daily calcium intake of 1,200 mg per day.  Calcium is found in foods like yogurt, milk, orange juice with calcium, dark green leafy vegetables, and cheese.  If additional calcium supplement is needed, calcium citrate is the preferred form of calcium., Recommend doing weight bearing exercises, such as walking, light weights, and/or resistance training as tolerated., To avoid falls, recommend removing any loose throw rugs from walkways and moving any objects obstructing pathways in the home. Ensure there is adequate lighting, especially if moving around the house at night. Contact your primary care provider or pharmacist if any of the medications you take make you feel dizzy or too drowsy. Your current osteopenia medications are:: Calcium 600mg  - 1 tablet 2 times a day Vitamin D 2000u - 1 capsule daily  Hypothyroidism GOAL:: Maintain TSH levels within target range of 0.450 and 4.500 Patient education:: Recommend taking levothyroxine first thing in the morning before eating any food or drinking anything other than water. Take with a full glass of water and wait at least 30 minutes before eating for the day. Separate from vitamins by at least 4 hours. Your current thyroid medications are:: Levothyroxine - 1 tablet every morning  Chronic Pain GOAL: Maintain pain control and achieve a pain level less than  (on a 0-10 scale): Maintain pain control and achieve a pain level less than 5 Patient education:: Other Details:: Recommended ice/heat therapy, use of scheduled  Tylenol  q4-6 hours during the day, PT dry needling therapy, and use of home TENS unit. Your current Chronic pain medications are:: Tizanidine  - 1 tablet at bedtime as needed for pain Tramadol  - 1 tablet every 6 hours as needed for pain Gabapentin - 1 capsule 4 times a day as needed for pain  Fall Risk GOAL:: Remain free from falls Patient education:: Recommend using an assistive device, such as a cane or walker, to help prevent falls and relieve pressure on your joints., Recommend avoiding medications that increase drowsiness or cause dizziness, as this can increase the risk of falls., Educated patient/caregiver on home safety and environmental hazards such as clutter and throw rugs; insufficient lighting; broken or uneven steps that can cause tripping. Encourage patient to place items of frequent use in easy to reach areas: i.e., assistive device, eyeglasses, hearing aids, remote, drinks, bedside commode., Emphasized importance of continuing mobility and use of non-skid footwear and socks with treads  Other Needs Recommended Preventative Health Care:: Recommend scheduling Annual Wellness Visit, Recommend yearly flu vaccine  CP Care Plan: 10 min

## 2022-10-15 NOTE — Telephone Encounter (Signed)
McKew, Austin Miles, LPN  Missy Sabins, RN Priscella Mann said the patient has dual coverage. She has [employer] as secondary. Victorino Dike is also reaching out to [employer]. Please call Priscella Mann [phone]

## 2022-10-17 ENCOUNTER — Ambulatory Visit (INDEPENDENT_AMBULATORY_CARE_PROVIDER_SITE_OTHER): Payer: Medicare PPO | Admitting: Nurse Practitioner

## 2022-10-17 ENCOUNTER — Encounter: Payer: Self-pay | Admitting: Nurse Practitioner

## 2022-10-17 VITALS — BP 138/84 | HR 84 | Temp 97.9°F | Resp 16 | Ht 62.0 in | Wt 142.8 lb

## 2022-10-17 DIAGNOSIS — B3731 Acute candidiasis of vulva and vagina: Secondary | ICD-10-CM

## 2022-10-17 DIAGNOSIS — E559 Vitamin D deficiency, unspecified: Secondary | ICD-10-CM | POA: Diagnosis not present

## 2022-10-17 DIAGNOSIS — Z79899 Other long term (current) drug therapy: Secondary | ICD-10-CM | POA: Diagnosis not present

## 2022-10-17 DIAGNOSIS — R350 Frequency of micturition: Secondary | ICD-10-CM

## 2022-10-17 DIAGNOSIS — E039 Hypothyroidism, unspecified: Secondary | ICD-10-CM

## 2022-10-17 LAB — CBC WITH DIFFERENTIAL/PLATELET
Absolute Monocytes: 525 cells/uL (ref 200–950)
Basophils Absolute: 61 cells/uL (ref 0–200)
Basophils Relative: 1.2 %
Eosinophils Absolute: 143 cells/uL (ref 15–500)
HCT: 42 % (ref 35.0–45.0)
MCHC: 32.9 g/dL (ref 32.0–36.0)
Neutrophils Relative %: 60 %
Platelets: 246 10*3/uL (ref 140–400)
WBC: 5.1 10*3/uL (ref 3.8–10.8)

## 2022-10-17 MED ORDER — FLUCONAZOLE 150 MG PO TABS
ORAL_TABLET | ORAL | 0 refills | Status: DC
Start: 2022-10-17 — End: 2022-12-11

## 2022-10-17 MED ORDER — DICLOFENAC SODIUM 1 % EX GEL
2.0000 g | Freq: Every day | CUTANEOUS | 1 refills | Status: AC | PRN
  Filled 2023-08-25: qty 100, 50d supply, fill #0

## 2022-10-17 MED ORDER — DEXAMETHASONE 0.5 MG/5ML PO SOLN: 0.5000 mg | Freq: Three times a day (TID) | ORAL | 1 refills | Status: DC

## 2022-10-17 NOTE — Progress Notes (Signed)
Assessment and Plan:  Crystal [name] was seen today for an episodic visit.  Diagnoses and all order for this visit:  Vaginal candidiasis Suggest daily probiotic or yogurt Keep area clean and free of moisture  - Urinalysis, Routine w reflex microscopic - Urine Culture - fluconazole (DIFLUCAN) 150 MG tablet; Take 1 tablet (150 mg) at the sign of symptoms.  If not resolved after 72 hours may take additional 150 mg dosage.  Dispense: 3 tablet; Refill: 0  Acquired hypothyroidism Controlled. Continue Levothyroxine. Reminded to take on an empty stomach 30-12mins before food.  Stop any Biotin Supplement 48-72 hours before next TSH level to reduce the risk of falsely low TSH levels. Continue to monitor.    - TSH  Vitamin D deficiency Continue supplement for goal of 60-100 Monitor Vitamin D levels  Urinary frequency Stay well hydrated to keep urinary system well flushed Consider cranberry supplement  - Urinalysis, Routine w reflex microscopic - Urine Culture  Medication management All medications discussed and reviewed in full. All questions and concerns regarding medications addressed.    - COMPLETE METABOLIC PANEL WITH GFR - CBC with Differential/Platelet  Notify office for further evaluation and treatment, questions or concerns if s/s fail to improve. The risks and benefits of my recommendations, as well as other treatment options were discussed with the patient today. Questions were answered.  Further disposition pending results of labs. Discussed med's effects and SE's.    [name], counseling, chart review, and critical decision making was performed.   Future Appointments  Date Time Provider Department Center  10/20/2022 11:15 AM Coralyn Helling, MD [facility]  11/19/2022 11:30 AM Adela Glimpse, NP [facility] None  05/18/2023  2:00 PM [name], MD [facility] None     ------------------------------------------------------------------------------------------------------------------   HPI BP 138/84   Pulse 84   Temp 97.9 F (36.6 C)   Resp 16   Ht 5\' 2"  (1.575 m)   Wt 142 lb 12.8 oz (64.8 kg)   SpO2 98%   BMI 26.12 kg/m   72 y.o.female presents for evaluation of vaginal itching with discharge accompanied by urinary frequency.  Denies dysuria or hematuria.  Denies fever, chills, N/V.  She does states overall fatigue and feels this may be due to her thyroid levels being off as well as her vitamin d levels.  She reports medication compliance.    Past Medical History:  Diagnosis Date   Arthritis    ASCVD (arteriosclerotic cardiovascular disease)    Closed nondisplaced subtrochanteric fracture of left femur 09/03/2017   September 2018.  Status post surgery by Dr. Carola Frost   Crohn's disease of ileum without complication    DDD (degenerative disc disease), lumbar    Depression    Dyspnea    Eye infection    Fibromyalgia    Gait disorder 10/26/2012   GERD (gastroesophageal reflux disease)    GI bleed    Hyperlipidemia    Hypertension    Hypothyroid    Periprosthetic supracondylar fracture of femur, left  03/23/2017   Pneumonia    Pre-diabetes    Restless leg syndrome    Rhinitis 06/25/2010   Sleep apnea    does not use CPAP   Spinal headache    with the C section   TIA (transient ischemic attack)    3         Vitamin D deficiency      Allergies  Allergen Reactions   Methocarbamol Shortness Of Breath, Nausea Only and Other (  See Comments)    Other reaction(s): Dizziness, psychological reaction, disoriented Sleepy and weight loss    Codeine Other (See Comments)    Headache   Statins Other (See Comments)    Extreme pain   Azathioprine    Other Other (See Comments)    MINT=sore mouth and stomach upset   Oxycodone     Dizziness, unbalanced.   Amitriptyline Other (See Comments)    Confusion and hallucinations   Erythromycin Base  Rash    Flushing/red face.   Fish Oil Nausea Only   Flax Seed [Flax Seed Oil] Rash   Flaxseed (Linseed) Rash   Hydrocodone Itching and Rash        Morphine Nausea And Vomiting and Rash   Nsaids Nausea Only   Nuvigil [Armodafinil] Other (See Comments)    Unspecified   Sertraline Rash   Sinemet [Carbidopa W-Levodopa] Rash   Sulfa Antibiotics Rash   Tolmetin Nausea Only    Current Outpatient Medications on File Prior to Visit  Medication Sig   AMBULATORY NON FORMULARY MEDICATION CBD gummy at bedtime   BIOTIN PO Take by mouth.   Black Pepper-Turmeric (TURMERIC PLUS BLACK PEPPER EXT PO) Take by mouth.   Calcium Carbonate (CALCIUM 600 PO) Take by mouth.   carboxymethylcellulose (REFRESH PLUS) 0.5 % SOLN Place 1 drop into both eyes 3 (three) times daily as needed (dry eyes).   CINNAMON PO Take by mouth.   clobetasol ointment (TEMOVATE) 0.05 % Apply 1 Application topically 3 (three) times daily as needed (lichen planus).   clotrimazole (MYCELEX) 10 MG troche Take 10 mg by mouth 3 (three) times daily.   dexamethasone (DECADRON) 0.5 MG/5ML solution Take 0.5 mg by mouth 3 (three) times daily.   diclofenac Sodium (VOLTAREN) 1 % GEL Apply 2 g topically daily as needed (pain).   docusate sodium (COLACE) 100 MG capsule Take 100 mg by mouth daily as needed for mild constipation or moderate constipation.   DULoxetine (CYMBALTA) 60 MG capsule Take 60 mg by mouth daily.   ezetimibe (ZETIA) 10 MG tablet Take 1 tablet (10 mg total) by mouth daily.   Ferrous Sulfate (IRON PO) Take 25 mg by mouth daily.   fluticasone (FLONASE) 50 MCG/ACT nasal spray USE TWO SPRAYS IN EACH NOSTRIL DAILY AS NEEDED (Patient taking differently: Place 2 sprays into both nostrils at bedtime.)   gabapentin (NEURONTIN) 300 MG capsule Take 1 capsule 4 x /day for Chronic Pain         ( Dx: m79.7)                                               /                                TAKE                                   BY                                   MOUTH   levothyroxine (SYNTHROID) 88 MCG tablet Take  1 tablet  Daily  on an empty stomach with only water for  30 minutes & no Antacid meds, Calcium or Magnesium for 4 hours & avoid Biotin   meclizine (ANTIVERT) 25 MG tablet TAKE 1 TABLET (25 MG TOTAL) BY MOUTH 3 (THREE) TIMES DAILY AS NEEDED FOR DIZZINESS.   mupirocin ointment (BACTROBAN) 2 % Apply 1 Application topically 3 (three) times daily as needed (lichen planus).   neomycin-polymyxin-hydrocortisone (CORTISPORIN) OTIC solution Place 3 drops into both ears daily as needed (ear inflammation).   omeprazole (PRILOSEC) 40 MG capsule Take 1 capsule (40 mg total) by mouth daily.   OVER THE COUNTER MEDICATION Place 1 drop under the tongue daily. D3 5000 units - K2 120 mcg   polyethylene glycol (MIRALAX / GLYCOLAX) 17 g packet Take 17-34 g by mouth at bedtime.   rOPINIRole (REQUIP) 2 MG tablet Take  1 tablet  2 x / day  &  2 tablets  at Bedtime  for Restless Legs   tiZANidine (ZANAFLEX) 2 MG tablet Take 2 mg by mouth at bedtime.   traMADol (ULTRAM) 50 MG tablet Take 50 mg by mouth every 6 (six) hours as needed for moderate pain or severe pain.   benzonatate (TESSALON) 200 MG capsule TAKE 1 CAPSULE (PERLE) BY MOUTH 3 X /DAY ONLY IF NEEDED FOR SEVERE COUGH (Patient not taking: Reported on 09/03/2022)   fenofibrate micronized (LOFIBRA) 134 MG capsule TAKE 1 CAPSULE EVERY DAY BEFORE BREAKFAST   predniSONE (DELTASONE) 5 MG tablet PLEASE SEE ATTACHED FOR DETAILED DIRECTIONS (Patient not taking: Reported on 09/03/2022)   Vitamin D-Vitamin K (VITAMIN D2 + K1) 20-120 MCG/0.1ML LIQD Take by mouth.   No current facility-administered medications on file prior to visit.    ROS: all negative except what is noted in the HPI.   Physical Exam:  BP 138/84   Pulse 84   Temp 97.9 F (36.6 C)   Resp 16   Ht 5\' 2"  (1.575 m)   Wt 142 lb 12.8 oz (64.8 kg)   SpO2 98%   BMI 26.12 kg/m   General Appearance: NAD.  Awake, conversant and  cooperative. Eyes: PERRLA, EOMs intact.  Sclera white.  Conjunctiva without erythema. Sinuses: No frontal/maxillary tenderness.  No nasal discharge. Nares patent.  ENT/Mouth: Ext aud canals clear.  Bilateral TMs w/DOL and without erythema or bulging. Hearing intact.  Posterior pharynx without swelling or exudate.  Tonsils without swelling or erythema.  Neck: Supple.  No masses, nodules or thyromegaly. Respiratory: Effort is regular with non-labored breathing. Breath sounds are equal bilaterally without rales, rhonchi, wheezing or stridor.  Cardio: RRR with no MRGs. Brisk peripheral pulses without edema.  Abdomen: Active BS in all four quadrants.  Soft and non-tender without guarding, rebound tenderness, hernias or masses. Lymphatics: Non tender without lymphadenopathy.  Musculoskeletal: Full ROM, 5/5 strength, normal ambulation.  No clubbing or cyanosis. Skin: Appropriate color for ethnicity. Warm without rashes, lesions, ecchymosis, ulcers.  Neuro: CN II-XII grossly normal. Normal muscle tone without cerebellar symptoms and intact sensation.   Psych: AO X 3,  appropriate mood and affect, insight and judgment.     Adela Glimpse, NP 10:18 AM [facility]

## 2022-10-18 ENCOUNTER — Encounter: Payer: Self-pay | Admitting: Gastroenterology

## 2022-10-18 LAB — URINALYSIS, ROUTINE W REFLEX MICROSCOPIC
Bilirubin Urine: NEGATIVE
Glucose, UA: NEGATIVE
Hgb urine dipstick: NEGATIVE
Ketones, ur: NEGATIVE
Leukocytes,Ua: NEGATIVE
Nitrite: NEGATIVE
Protein, ur: NEGATIVE
Specific Gravity, Urine: 1.028 (ref 1.001–1.035)
pH: 5.5 (ref 5.0–8.0)

## 2022-10-18 LAB — COMPLETE METABOLIC PANEL WITH GFR
AG Ratio: 2.2 (calc) (ref 1.0–2.5)
ALT: 10 U/L (ref 6–29)
AST: 15 U/L (ref 10–35)
Albumin: 4.7 g/dL (ref 3.6–5.1)
Alkaline phosphatase (APISO): 62 U/L (ref 37–153)
BUN: 19 mg/dL (ref 7–25)
CO2: 31 mmol/L (ref 20–32)
Calcium: 9.8 mg/dL (ref 8.6–10.4)
Chloride: 103 mmol/L (ref 98–110)
Creat: 0.72 mg/dL (ref 0.60–1.00)
Globulin: 2.1 g/dL (calc) (ref 1.9–3.7)
Glucose, Bld: 94 mg/dL (ref 65–99)
Potassium: 5.5 mmol/L — ABNORMAL HIGH (ref 3.5–5.3)
Sodium: 143 mmol/L (ref 135–146)
Total Bilirubin: 0.4 mg/dL (ref 0.2–1.2)
Total Protein: 6.8 g/dL (ref 6.1–8.1)
eGFR: 88 mL/min/{1.73_m2} (ref >=60)

## 2022-10-18 LAB — CBC WITH DIFFERENTIAL/PLATELET
Eosinophils Relative: 2.8 %
Hemoglobin: 13.8 g/dL (ref 11.7–15.5)
Lymphs Abs: 1311 cells/uL (ref 850–3900)
MCH: 28.8 pg (ref 27.0–33.0)
MCV: 87.5 fL (ref 80.0–100.0)
MPV: 9.3 fL (ref 7.5–12.5)
Monocytes Relative: 10.3 %
Neutro Abs: 3060 cells/uL (ref 1500–7800)
RBC: 4.8 10*6/uL (ref 3.80–5.10)
RDW: 13.5 % (ref 11.0–15.0)
Total Lymphocyte: 25.7 %

## 2022-10-18 LAB — VITAMIN D 25 HYDROXY (VIT D DEFICIENCY, FRACTURES): Vit D, 25-Hydroxy: 54 ng/mL (ref 30–100)

## 2022-10-18 LAB — URINE CULTURE
MICRO NUMBER:: 14849814
Result:: NO GROWTH
SPECIMEN QUALITY:: ADEQUATE

## 2022-10-18 LAB — TSH: TSH: 2.23 mIU/L (ref 0.40–4.50)

## 2022-10-20 ENCOUNTER — Encounter: Payer: Self-pay | Admitting: Pulmonary Disease

## 2022-10-20 ENCOUNTER — Encounter (HOSPITAL_BASED_OUTPATIENT_CLINIC_OR_DEPARTMENT_OTHER): Payer: Self-pay | Admitting: Pulmonary Disease

## 2022-10-20 ENCOUNTER — Other Ambulatory Visit (HOSPITAL_BASED_OUTPATIENT_CLINIC_OR_DEPARTMENT_OTHER): Payer: Self-pay

## 2022-10-20 ENCOUNTER — Other Ambulatory Visit: Payer: Self-pay | Admitting: Nurse Practitioner

## 2022-10-20 ENCOUNTER — Ambulatory Visit (INDEPENDENT_AMBULATORY_CARE_PROVIDER_SITE_OTHER): Payer: Medicare PPO | Admitting: Pulmonary Disease

## 2022-10-20 VITALS — BP 124/88 | HR 97 | Temp 97.5°F | Ht 62.0 in | Wt 142.6 lb

## 2022-10-20 DIAGNOSIS — G4733 Obstructive sleep apnea (adult) (pediatric): Secondary | ICD-10-CM

## 2022-10-20 DIAGNOSIS — E875 Hyperkalemia: Secondary | ICD-10-CM

## 2022-10-20 NOTE — Progress Notes (Signed)
[facility]  Chief Complaint  Patient presents with   Follow-up    Follow up on inspire. Patient wants to know if skyrizi could affect her inspire.     Past Surgical History:  She  has a past surgical history that includes Shoulder adhesion release; Spine surgery; Appendectomy (1960); Cesarean section (1610 & 1886); Abdominal hysterectomy (1992); Rotator cuff repair (Left, 2005); Tonsillectomy (1960); Joint replacement; Toe Surgery; Toe Surgery; Anterior cervical decompression/discectomy fusion 4 level (N/A, 06/14/2015); ORIF femur fracture (Left, 03/24/2017); Knee surgery; Back surgery; Eye surgery; Transforaminal lumbar interbody fusion w/ mis 1 level (Left, 09/05/2021); Drug induced endoscopy (N/A, 02/25/2022); Cataract extraction, bilateral; Implantation of hypoglossal nerve stimulator (Right, 05/29/2022); Finger arthroscopy; and Spinal cord decompression.  Past Medical History:  Vit D deficiency, TIA, RLS, Hypothyroidism, HTN, HLD, GERD, Fibromyalgia, Depression, CAD, Crohn's disease, Bell's palsy, Lichen planus  Constitutional:  BP 124/88 (BP Location: Right Arm, Patient Position: Sitting, Cuff Size: Normal)   Pulse 97   Temp (!) 97.5 F (36.4 C) (Oral)   Ht  (1.575 m)   Wt 142 lb 9.6 oz (64.7 kg)   SpO2 93%   BMI 26.08 kg/m   Brief Summary:  Crystal [name] is a 72 y.o. female with obstructive sleep apnea, and bronchiectasis in the setting of reflux.      Subjective:   She uses her Inspire device nightly for about 7.5 hrs per night.  She gets up around 5 am to take her medicine.  She usually goes back to sleep for a couple of hours, but doesn't turn the device back on.  She felt some slight discomfort when she set the device to 1.6 V, but this got better when she lowered to voltage.  Not having difficulty swallowing, change in voice, or neck pain.  Not having cough, wheeze, or chest congestion.  She is hoping to  get started on [name] for her Crohn's colitis.  Physical Exam:   Appearance - well kempt, Rt facial droop  ENMT - no sinus tenderness, no oral exudate, no LAN, Mallampati 3 airway, no stridor  Respiratory - equal breath sounds bilaterally, no wheezing or rales  CV - s1s2 regular rate and rhythm, no murmurs  Ext - no clubbing, no edema  Skin - no rashes  Psych - normal mood and affect   Pulmonary testing:  PFT 11/21/15 >> FEV1 2.51 (110%), FEV1% 85%, TLC 4.36 (88%), DLCO 77% PFT 05/16/22 >> FEV1 2.26 (107%), FEV1% 82, TLC 5.73 (116%), DLCO 81%  Chest Imaging:  CT chest 10/01/17 >> 3 mm LLL nodule, mild cylindrical BTX Rt side  Sleep Tests:  HST 12/04/15 >> 30.7, SpO2 low 73% HST 11/06/21 >> AHI 36.5, SpO2 low 73%  Cardiac Tests:  Echo 02/28/22 >> EF 55 to 60%, mild MR  Social History:  She  reports that she has never smoked. She has been exposed to tobacco smoke. She has never used smokeless tobacco. She reports current alcohol use of about 2.0 standard drinks of alcohol per week. She reports that she does not use drugs.  Family History:  Her family history includes Arthritis in her mother; Breast cancer in her mother; Cancer in her mother; Depression in her sister; Diabetes in her mother; Heart disease in her father; Hyperlipidemia in her father; Hypertension in her father; Leukemia in her sister; Lupus in her sister; Migraines in her daughter; Neuropathy in her father; Osteoporosis in her mother; Rheum arthritis in her sister.  Assessment/Plan:   Obstructive sleep apnea. - intolerant of CPAP - had Inspire implanted in November 2023 - device activated 07/18/22 - will arrange for Inspire titration study - advised her to turn the device off when she is going to eat or drink, including during the night - advised her to turn the device on when she is sleeping longer in the morning or during more prolonged nap sessions  Regional bronchiectasis in setting of reflux. - prn  antitussive and expectorants  Crohn's colitis. - followed by Dr. Ileene Patrick with [facility]  Lichen planus. - followed by Dr Inis Sizer with dermatology  Fibromyalgia, arthritis. - followed by Dr. Casimer Lanius with rheumatology   Time Spent Involved in Patient Care on Day of Examination:  38 minutes  Follow up:   Patient Instructions  Will arrange for an in-lab sleep study and follow up after this is done.  Medication List:   Allergies as of 10/20/2022       Reactions   Methocarbamol Shortness Of Breath, Nausea Only, Other (See Comments)   Other reaction(s): Dizziness, psychological reaction, disoriented Sleepy and weight loss   Codeine Other (See Comments)   Headache   Statins Other (See Comments)   Extreme pain   Azathioprine    Other Other (See Comments)   MINT=sore mouth and stomach upset   Oxycodone    Dizziness, unbalanced.   Amitriptyline Other (See Comments)   Confusion and hallucinations   Erythromycin Base Rash   Flushing/red face.   Fish Oil Nausea Only   Flax Seed [flax Seed Oil] Rash   Flaxseed (linseed) Rash   Hydrocodone Itching, Rash      Morphine Nausea And Vomiting, Rash   Nsaids Nausea Only   Nuvigil [armodafinil] Other (See Comments)   Unspecified   Sertraline Rash   Sinemet [carbidopa W-levodopa] Rash   Sulfa Antibiotics Rash   Tolmetin Nausea Only        Medication List        Accurate as of October 20, 2022 12:55 PM. If you have any questions, ask your nurse or doctor.          STOP taking these medications    benzonatate 200 MG capsule Commonly known as: TESSALON Stopped by: Coralyn Helling, MD   docusate sodium 100 MG capsule Commonly known as: COLACE Stopped by: Coralyn Helling, MD   ezetimibe 10 MG tablet Commonly known as: Zetia Stopped by: Coralyn Helling, MD   predniSONE 5 MG tablet Commonly known as: DELTASONE Stopped by: Coralyn Helling, MD   Vitamin D2 + K1 20-120 MCG/0.1ML Liqd Generic drug:  Vitamin D-Vitamin K Stopped by: Coralyn Helling, MD       TAKE these medications    AMBULATORY NON FORMULARY MEDICATION CBD gummy at bedtime   BIOTIN PO Take by mouth.   CALCIUM 600 PO Take by mouth.   carboxymethylcellulose 0.5 % Soln Commonly known as: REFRESH PLUS Place 1 drop into both eyes 3 (three) times daily as needed (dry eyes).   CINNAMON PO Take by mouth.   clobetasol ointment 0.05 % Commonly known as: TEMOVATE Apply 1 Application topically 3 (three) times daily as needed (lichen planus).   clotrimazole 10 MG troche Commonly known as: MYCELEX Take 10 mg by mouth 3 (three) times daily.   dexamethasone 0.5 MG/5ML solution Commonly known as: DECADRON Take 5 mLs (0.5 mg total) by mouth 3 (three) times daily.   diclofenac Sodium 1 % Gel Commonly known as: VOLTAREN Apply 2 g topically  daily as needed (pain).   DULoxetine 60 MG capsule Commonly known as: CYMBALTA Take 60 mg by mouth daily.   fenofibrate micronized 134 MG capsule Commonly known as: LOFIBRA TAKE 1 CAPSULE EVERY DAY BEFORE BREAKFAST   fluconazole 150 MG tablet Commonly known as: Diflucan Take 1 tablet (150 mg) at the sign of symptoms.  If not resolved after 72 hours may take additional 150 mg dosage.   fluticasone 50 MCG/ACT nasal spray Commonly known as: FLONASE USE TWO SPRAYS IN EACH NOSTRIL DAILY AS NEEDED What changed:  how much to take how to take this when to take this additional instructions   gabapentin 300 MG capsule Commonly known as: NEURONTIN Take 1 capsule 4 x /day for Chronic Pain         ( Dx: m79.7)                                               /                                TAKE                                   BY                                  MOUTH   IRON PO Take 25 mg by mouth daily.   levothyroxine 88 MCG tablet Commonly known as: Synthroid Take  1 tablet  Daily  on an empty stomach with only water for 30 minutes & no Antacid meds, Calcium or Magnesium for 4  hours & avoid Biotin   meclizine 25 MG tablet Commonly known as: ANTIVERT TAKE 1 TABLET (25 MG TOTAL) BY MOUTH 3 (THREE) TIMES DAILY AS NEEDED FOR DIZZINESS.   mupirocin ointment 2 % Commonly known as: BACTROBAN Apply 1 Application topically 3 (three) times daily as needed (lichen planus).   neomycin-polymyxin-hydrocortisone OTIC solution Commonly known as: CORTISPORIN Place 3 drops into both ears daily as needed (ear inflammation).   omeprazole 40 MG capsule Commonly known as: PRILOSEC Take 1 capsule (40 mg total) by mouth daily.   OVER THE COUNTER MEDICATION Place 1 drop under the tongue daily. D3 5000 units - K2 120 mcg   polyethylene glycol 17 g packet Commonly known as: MIRALAX / GLYCOLAX Take 17-34 g by mouth at bedtime.   rOPINIRole 2 MG tablet Commonly known as: REQUIP Take  1 tablet  2 x / day  &  2 tablets  at Bedtime  for Restless Legs   tiZANidine 2 MG tablet Commonly known as: ZANAFLEX Take 2 mg by mouth at bedtime.   traMADol 50 MG tablet Commonly known as: ULTRAM Take 50 mg by mouth every 6 (six) hours as needed for moderate pain or severe pain.   TURMERIC PLUS BLACK PEPPER EXT PO Take by mouth.        Signature:  Coralyn Helling, MD [facility] Pager - [phone] 10/20/2022, 12:55 PM

## 2022-10-20 NOTE — Patient Instructions (Signed)
Will arrange for an in-lab sleep study and follow up after this is done.

## 2022-10-21 DIAGNOSIS — R260 Ataxic gait: Secondary | ICD-10-CM | POA: Diagnosis not present

## 2022-10-21 DIAGNOSIS — M542 Cervicalgia: Secondary | ICD-10-CM | POA: Diagnosis not present

## 2022-10-21 DIAGNOSIS — R531 Weakness: Secondary | ICD-10-CM | POA: Diagnosis not present

## 2022-10-21 DIAGNOSIS — M79604 Pain in right leg: Secondary | ICD-10-CM | POA: Diagnosis not present

## 2022-10-21 NOTE — Telephone Encounter (Signed)
Patient called requesting to speak with a nurse regarding the Syrizi medication that she has been having a hard time with getting.

## 2022-10-23 ENCOUNTER — Telehealth: Payer: Self-pay | Admitting: Gastroenterology

## 2022-10-23 ENCOUNTER — Encounter: Payer: Self-pay | Admitting: Gastroenterology

## 2022-10-23 NOTE — Telephone Encounter (Signed)
Patient

## 2022-10-23 NOTE — Telephone Encounter (Signed)
Patient called regarding [employer] program for [employer]. She is requesting a call back to have updates and discuss to make sure everything is on the same page. I informed her that their is still a response pending. Still would like a call back to discuss this. Please advise, thank you.

## 2022-10-23 NOTE — Telephone Encounter (Signed)
[name],  She has been approved, but has not met her annual deductible (although it is not a large amount). She will be responsible for 20%. Patient requested that she be enrolled in the free drug program.  Awaiting response (as of today its still pending) I'm hopeful we will have a response shortly. I will f/u with once I have a response. Crystal [name]

## 2022-10-26 NOTE — Patient Instructions (Signed)

## 2022-10-28 DIAGNOSIS — R531 Weakness: Secondary | ICD-10-CM | POA: Diagnosis not present

## 2022-10-28 DIAGNOSIS — M542 Cervicalgia: Secondary | ICD-10-CM | POA: Diagnosis not present

## 2022-10-28 DIAGNOSIS — M79604 Pain in right leg: Secondary | ICD-10-CM | POA: Diagnosis not present

## 2022-10-28 DIAGNOSIS — E1169 Type 2 diabetes mellitus with other specified complication: Secondary | ICD-10-CM | POA: Diagnosis not present

## 2022-10-28 DIAGNOSIS — E785 Hyperlipidemia, unspecified: Secondary | ICD-10-CM | POA: Diagnosis not present

## 2022-10-28 DIAGNOSIS — N182 Chronic kidney disease, stage 2 (mild): Secondary | ICD-10-CM | POA: Diagnosis not present

## 2022-10-28 DIAGNOSIS — R260 Ataxic gait: Secondary | ICD-10-CM | POA: Diagnosis not present

## 2022-10-28 DIAGNOSIS — I10 Essential (primary) hypertension: Secondary | ICD-10-CM | POA: Diagnosis not present

## 2022-10-30 NOTE — Telephone Encounter (Signed)
Great, thank you for your help and work on this.

## 2022-10-30 NOTE — Telephone Encounter (Signed)
[place], [place] note: Patient has been approved for [employer] (free drug) program. She will be scheduled as soon as possible. Selena Batten

## 2022-10-30 NOTE — Telephone Encounter (Signed)
Just spoke with her again and she is in the process of getting the OBI approved as well.  She wants to hold treatment until initiation and maintenance dosing (both) are approved.

## 2022-10-31 ENCOUNTER — Ambulatory Visit (INDEPENDENT_AMBULATORY_CARE_PROVIDER_SITE_OTHER): Payer: Medicare PPO

## 2022-10-31 ENCOUNTER — Ambulatory Visit (INDEPENDENT_AMBULATORY_CARE_PROVIDER_SITE_OTHER): Payer: Medicare PPO | Admitting: Podiatry

## 2022-10-31 DIAGNOSIS — M7751 Other enthesopathy of right foot: Secondary | ICD-10-CM

## 2022-10-31 DIAGNOSIS — M2042 Other hammer toe(s) (acquired), left foot: Secondary | ICD-10-CM | POA: Diagnosis not present

## 2022-10-31 DIAGNOSIS — M7752 Other enthesopathy of left foot: Secondary | ICD-10-CM | POA: Diagnosis not present

## 2022-10-31 DIAGNOSIS — M2041 Other hammer toe(s) (acquired), right foot: Secondary | ICD-10-CM | POA: Diagnosis not present

## 2022-10-31 MED ORDER — TRIAMCINOLONE ACETONIDE 10 MG/ML IJ SUSP
20.0000 mg | Freq: Once | INTRAMUSCULAR | Status: AC
Start: 2022-10-31 — End: 2022-10-31
  Administered 2022-10-31: 20 mg

## 2022-11-02 NOTE — Progress Notes (Signed)
Subjective:   Patient ID: Crystal [name], female   DOB: 72 y.o.   MRN: [id number]   HPI Patient states she is getting pain in the forefoot of both feet and that she also gets some swelling at times in her feet and tingling   ROS      Objective:  Physical Exam  Neurovascular status intact muscle strength found to be adequate inflammation fluid buildup around the second MPJ bilateral moderate prominence of the bone structure no other pathology noted.  Good digital perfusion well-oriented     Assessment:  Inflammatory capsulitis around the second MPJ bilateral     Plan:  H&P reviewed recommended insert therapy and support shoes and if symptoms get worse we will have to consider other treatment and due to the area of acute inflammation I did do sterile prep and injected around the second MPJ bilateral 3 mg Dexasone Kenalog 5 mg Xylocaine periarticular  X-rays were negative for signs of fracture did indicate mild osteoporosis arthritis

## 2022-11-04 ENCOUNTER — Encounter: Payer: Self-pay | Admitting: Internal Medicine

## 2022-11-04 ENCOUNTER — Encounter: Payer: Self-pay | Admitting: Nurse Practitioner

## 2022-11-04 DIAGNOSIS — R260 Ataxic gait: Secondary | ICD-10-CM | POA: Diagnosis not present

## 2022-11-04 DIAGNOSIS — M79604 Pain in right leg: Secondary | ICD-10-CM | POA: Diagnosis not present

## 2022-11-04 DIAGNOSIS — M542 Cervicalgia: Secondary | ICD-10-CM | POA: Diagnosis not present

## 2022-11-04 DIAGNOSIS — R531 Weakness: Secondary | ICD-10-CM | POA: Diagnosis not present

## 2022-11-05 ENCOUNTER — Encounter: Payer: Self-pay | Admitting: Gastroenterology

## 2022-11-05 NOTE — Telephone Encounter (Signed)
Thank you, Kim

## 2022-11-05 NOTE — Telephone Encounter (Signed)
Dr. Adela Lank, [place],  F/u:  patient has been approved for the IV dosing and the OBI. We will get patient scheduled as soon as possible. Selena Batten

## 2022-11-06 ENCOUNTER — Ambulatory Visit (INDEPENDENT_AMBULATORY_CARE_PROVIDER_SITE_OTHER): Payer: Medicare PPO

## 2022-11-06 ENCOUNTER — Ambulatory Visit (INDEPENDENT_AMBULATORY_CARE_PROVIDER_SITE_OTHER): Payer: Medicare PPO | Admitting: Podiatry

## 2022-11-06 ENCOUNTER — Encounter: Payer: Self-pay | Admitting: Podiatry

## 2022-11-06 DIAGNOSIS — S99921A Unspecified injury of right foot, initial encounter: Secondary | ICD-10-CM

## 2022-11-06 DIAGNOSIS — I999 Unspecified disorder of circulatory system: Secondary | ICD-10-CM | POA: Diagnosis not present

## 2022-11-07 ENCOUNTER — Encounter: Payer: Self-pay | Admitting: Gastroenterology

## 2022-11-07 ENCOUNTER — Encounter: Payer: Self-pay | Admitting: Podiatry

## 2022-11-07 NOTE — Progress Notes (Signed)
Subjective:   Patient ID: Crystal [name], female   DOB: 72 y.o.   MRN: [id number]   HPI Patient presents stating she injured her right foot and also in the last couple days she has noted discoloration in the hallux and second digits left and moderately into the right digits stating both feet are painful and these last few days   ROS      Objective:  Physical Exam  Vascular status indicates currently diminishment of PT and DP pulses with coolness to both feet no claudication symptoms to a significant range but I am concerned about the distal appearance of the digits with no breakdowns of skin noted anywhere with moderate discomfort right secondary to trauma     Assessment:  Cannot rule out the possibility that there may be some vascular issue going on here along with trauma to the right foot     Plan:  H&P x-ray reviewed right and I went ahead today and I am sending for vascular evaluation ABIs and I gave strict instructions that if the toes turn darker she may need to go to the emergency room.  We will get her in within the hopefully the next few days I do not see this is an emergency but I think it needs to be checked.  Right foot should improve secondary to the trauma  X-rays indicate no signs of fracture fixation remains in place both feet from previous fusion first MPJ left and osteotomy first MPJ right

## 2022-11-10 ENCOUNTER — Encounter: Payer: Self-pay | Admitting: Nurse Practitioner

## 2022-11-10 ENCOUNTER — Ambulatory Visit (INDEPENDENT_AMBULATORY_CARE_PROVIDER_SITE_OTHER): Payer: Medicare PPO | Admitting: Nurse Practitioner

## 2022-11-10 VITALS — BP 120/76 | HR 101 | Temp 97.4°F | Ht 62.0 in | Wt 142.6 lb

## 2022-11-10 DIAGNOSIS — Z124 Encounter for screening for malignant neoplasm of cervix: Secondary | ICD-10-CM | POA: Diagnosis not present

## 2022-11-10 DIAGNOSIS — N898 Other specified noninflammatory disorders of vagina: Secondary | ICD-10-CM | POA: Diagnosis not present

## 2022-11-10 DIAGNOSIS — R300 Dysuria: Secondary | ICD-10-CM | POA: Diagnosis not present

## 2022-11-10 DIAGNOSIS — B3731 Acute candidiasis of vulva and vagina: Secondary | ICD-10-CM

## 2022-11-10 DIAGNOSIS — B009 Herpesviral infection, unspecified: Secondary | ICD-10-CM | POA: Diagnosis not present

## 2022-11-10 NOTE — Progress Notes (Signed)
Assessment and Plan:  Crystal [name] was seen today for an episodic visit.  Diagnoses and all order for this visit:  Screening for cervical cancer Vaginal swab - hysterectomy Results pending  - PAP, TP IMAGING, WNL RFLX HPV  Vaginal candidiasis Continue Fluconazole as directed Keep vaginal area free from moisture  Suggest daily probiotic  Vaginal itching  - PAP, TP IMAGING, WNL RFLX HPV  Herpes simplex type 2 infection History - Start Valacyclovir Avoid any sexual encounter. Continue to monitor  - PAP, TP IMAGING, WNL RFLX HPV  Dysuria Continue to stay well hydrated Recommend cranberry supplement Continue to monitor  - Urinalysis, Routine w reflex microscopic - Urine Culture  Continue to monitor for any increase in fever, chills, N/V, diarrhea, changes to bowel habits, blood in stool.  Notify office for further evaluation and treatment, questions or concerns if s/s fail to improve. The risks and benefits of my recommendations, as well as other treatment options were discussed with the patient today. Questions were answered.  Further disposition pending results of labs. Discussed med's effects and SE's.    [name], counseling, chart review, and critical decision making was performed.   Future Appointments  Date Time Provider Department Center  11/13/2022  3:00 PM [facility] 4 - SS [facility]  11/19/2022 11:30 AM Adela Glimpse, NP [facility] None  11/25/2022  8:00 PM Coralyn Helling, MD [facility]  12/05/2022  2:00 PM Coralyn Helling, MD [facility]  05/18/2023  2:00 PM [name], MD [facility] None    ------------------------------------------------------------------------------------------------------------------   HPI BP 120/76   Pulse (!) 101   Temp (!) 97.4 F (36.3 C)   Ht 5\' 2"  (1.575 m)   Wt 142 lb 9.6 oz (64.7 kg)   SpO2 99%   BMI 26.08 kg/m   Crystal y.o.[name] presents for evaluation of vaginal pain, itching and  discharge with history of HSV 2.  Onset 3 weeks ago.  Associated dysuria.  She is due for a pelvic exam and pap smear.  She has not noticed any outer lesions around the vaginal area.  Denies fever, chills, N/V, AMS.    She reports her most recent annual exam was two year ago with GYN and she gets yearly d/t hx of abnormal pap.    Past Medical History:  Diagnosis Date   Arthritis    ASCVD (arteriosclerotic cardiovascular disease)    Closed nondisplaced subtrochanteric fracture of left femur (HCC) 09/03/2017   September 2018.  Status post surgery by Dr. Carola Frost   Crohn's disease of ileum without complication (HCC)    DDD (degenerative disc disease), lumbar    Depression    Dyspnea    Eye infection    Fibromyalgia    Gait disorder 10/26/2012   GERD (gastroesophageal reflux disease)    GI bleed    Hyperlipidemia    Hypertension    Hypothyroid    Periprosthetic supracondylar fracture of femur, left  03/23/2017   Pneumonia    Pre-diabetes    Restless leg syndrome    Rhinitis 06/25/2010   Sleep apnea    does not use CPAP   Spinal headache    with the C section   TIA (transient ischemic attack)    3         Vitamin D deficiency      Allergies  Allergen Reactions   Methocarbamol Shortness Of Breath, Nausea Only and Other (See Comments)    Other reaction(s): Dizziness, psychological reaction, disoriented  Sleepy and weight loss    Codeine Other (See Comments)    Headache   Statins Other (See Comments)    Extreme pain   Azathioprine    Other Other (See Comments)    MINT=sore mouth and stomach upset   Oxycodone     Dizziness, unbalanced.   Amitriptyline Other (See Comments)    Confusion and hallucinations   Erythromycin Base Rash    Flushing/red face.   Fish Oil Nausea Only   Flax Seed [Flax Seed Oil] Rash   Flaxseed (Linseed) Rash   Hydrocodone Itching and Rash        Morphine Nausea And Vomiting and Rash   Nsaids Nausea Only   Nuvigil [Armodafinil] Other (See  Comments)    Unspecified   Sertraline Rash   Sinemet [Carbidopa W-Levodopa] Rash   Sulfa Antibiotics Rash   Tolmetin Nausea Only    Current Outpatient Medications on File Prior to Visit  Medication Sig   AMBULATORY NON FORMULARY MEDICATION CBD gummy at bedtime   BIOTIN PO Take by mouth.   Black Pepper-Turmeric (TURMERIC PLUS BLACK PEPPER EXT PO) Take by mouth.   Calcium Carbonate (CALCIUM 600 PO) Take by mouth.   carboxymethylcellulose (REFRESH PLUS) 0.5 % SOLN Place 1 drop into both eyes 3 (three) times daily as needed (dry eyes).   CINNAMON PO Take by mouth.   clobetasol ointment (TEMOVATE) 0.05 % Apply 1 Application topically 3 (three) times daily as needed (lichen planus).   clotrimazole (MYCELEX) 10 MG troche Take 10 mg by mouth 3 (three) times daily.   dexamethasone (DECADRON) 0.5 MG/5ML solution Take 5 mLs (0.5 mg total) by mouth 3 (three) times daily.   diclofenac Sodium (VOLTAREN) 1 % GEL Apply 2 g topically daily as needed (pain).   DULoxetine (CYMBALTA) 60 MG capsule Take 60 mg by mouth daily.   fenofibrate micronized (LOFIBRA) 134 MG capsule TAKE 1 CAPSULE EVERY DAY BEFORE BREAKFAST   Ferrous Sulfate (IRON PO) Take 25 mg by mouth daily.   fluconazole (DIFLUCAN) 150 MG tablet Take 1 tablet (150 mg) at the sign of symptoms.  If not resolved after 72 hours may take additional 150 mg dosage.   fluticasone (FLONASE) 50 MCG/ACT nasal spray USE TWO SPRAYS IN EACH NOSTRIL DAILY AS NEEDED (Patient taking differently: Place 2 sprays into both nostrils at bedtime.)   gabapentin (NEURONTIN) 300 MG capsule Take 1 capsule 4 x /day for Chronic Pain         ( Dx: m79.7)                                               /                                TAKE                                   BY                                  MOUTH   levothyroxine (SYNTHROID) 88 MCG tablet Take  1 tablet  Daily  on an empty stomach with only water for 30 minutes &  no Antacid meds, Calcium or Magnesium for 4 hours &  avoid Biotin   meclizine (ANTIVERT) 25 MG tablet TAKE 1 TABLET (25 MG TOTAL) BY MOUTH 3 (THREE) TIMES DAILY AS NEEDED FOR DIZZINESS.   mupirocin ointment (BACTROBAN) 2 % Apply 1 Application topically 3 (three) times daily as needed (lichen planus).   neomycin-polymyxin-hydrocortisone (CORTISPORIN) OTIC solution Place 3 drops into both ears daily as needed (ear inflammation).   omeprazole (PRILOSEC) 40 MG capsule Take 1 capsule (40 mg total) by mouth daily.   OVER THE COUNTER MEDICATION Place 1 drop under the tongue daily. D3 5000 units - K2 120 mcg   polyethylene glycol (MIRALAX / GLYCOLAX) 17 g packet Take 17-34 g by mouth at bedtime.   rOPINIRole (REQUIP) 2 MG tablet Take  1 tablet  2 x / day  &  2 tablets  at Bedtime  for Restless Legs   tiZANidine (ZANAFLEX) 2 MG tablet Take 2 mg by mouth at bedtime.   traMADol (ULTRAM) 50 MG tablet Take 50 mg by mouth every 6 (six) hours as needed for moderate pain or severe pain.   No current facility-administered medications on file prior to visit.    ROS: all negative except what is noted in the HPI.   Physical Exam:  BP 120/76   Pulse (!) 101   Temp (!) 97.4 F (36.3 C)   Ht 5\' 2"  (1.575 m)   Wt 142 lb 9.6 oz (64.7 kg)   SpO2 99%   BMI 26.08 kg/m   General Appearance: NAD.  Awake, conversant and cooperative. Eyes: PERRLA, EOMs intact.  Sclera white.  Conjunctiva without erythema. Sinuses: No frontal/maxillary tenderness.  No nasal discharge. Nares patent.  ENT/Mouth: Ext aud canals clear.  Bilateral TMs w/DOL and without erythema or bulging. Hearing intact.  Posterior pharynx without swelling or exudate.  Tonsils without swelling or erythema.  Neck: Supple.  No masses, nodules or thyromegaly. Respiratory: Effort is regular with non-labored breathing. Breath sounds are equal bilaterally without rales, rhonchi, wheezing or stridor.  Cardio: RRR with no MRGs. Brisk peripheral pulses without edema.  Abdomen: Active BS in all four quadrants.   Soft and non-tender without guarding, rebound tenderness, hernias or masses. Lymphatics: Non tender without lymphadenopathy.  Musculoskeletal: Full ROM, 5/5 strength, normal ambulation.  No clubbing or cyanosis. Skin: Appropriate color for ethnicity. Warm without rashes, lesions, ecchymosis, ulcers.  Neuro: CN II-XII grossly normal. Normal muscle tone without cerebellar symptoms and intact sensation.   Psych: AO X 3,  appropriate mood and affect, insight and judgment.  Vagina:  external genitalia normal and internal vagina with round red erythematous nodules. Painful to palpation.  Yellow discharge . Pap smear obtained.    Adela Glimpse, NP 3:03 PM [facility]

## 2022-11-11 DIAGNOSIS — M25512 Pain in left shoulder: Secondary | ICD-10-CM | POA: Diagnosis not present

## 2022-11-11 LAB — URINALYSIS, ROUTINE W REFLEX MICROSCOPIC
Bilirubin Urine: NEGATIVE
Glucose, UA: NEGATIVE
Hgb urine dipstick: NEGATIVE
Ketones, ur: NEGATIVE
Leukocytes,Ua: NEGATIVE
Nitrite: NEGATIVE
Protein, ur: NEGATIVE
Specific Gravity, Urine: 1.023 (ref 1.001–1.035)
pH: 5.5 (ref 5.0–8.0)

## 2022-11-11 LAB — URINE CULTURE
MICRO NUMBER:: 14948595
Result:: NO GROWTH
SPECIMEN QUALITY:: ADEQUATE

## 2022-11-12 ENCOUNTER — Encounter: Payer: Self-pay | Admitting: Nurse Practitioner

## 2022-11-12 LAB — PAP IG (IMAGE GUIDED)

## 2022-11-12 LAB — PAP, TP IMAGING, WNL RFLX HPV

## 2022-11-12 NOTE — Patient Instructions (Signed)
Genital Herpes Genital herpes is a common sexually transmitted infection (STI) that is caused by a virus. The virus spreads from person to person through contact with a sore, infected saliva, or infected skin. The virus can cause itching, blisters, and sores around the genitals or rectum. During an outbreak of infection, symptoms may last for several days and then go away. However, the virus remains in the body, so more outbreaks may happen in the future. The time between outbreaks varies and can be from months to years. Genital herpes can affect anyone. It is particularly concerning for pregnant women because the virus can be passed to the baby during delivery. Genital herpes is also a concern for people who have a weak disease-fighting system (immune system). What are the causes? This condition is caused by the herpes simplex virus, type 1 or type 2 (HSV-1 or HSV-2). The virus may spread through: Sexual contact with an infected person, including vaginal, anal, and oral sex. Contact with a herpes sore. The skin. This means that you can get herpes from an infected partner even if there are no blisters or sores present. Your partner may not know that he or she is infected. What increases the risk? You are more likely to develop this condition if: You have sex with many partners. You do not use latex or polyurethane condoms during sex. What are the signs or symptoms? Most people do not have symptoms or they have mild symptoms that may be mistaken for other skin problems. Symptoms may include: Small, red bumps near the genitals, rectum, or mouth. These bumps turn into blisters and then sores. Flu-like (influenza-like) symptoms, including: Fever. Body aches. Swollen lymph nodes. Headache. Painful urination. Pain and itching in the genital area or rectal area. Vaginal discharge. Tingling or shooting pain in the legs and buttocks. Generally, symptoms are more severe and last longer during the first  (primary) outbreak. Influenza-like symptoms are also more common during the primary outbreak. How is this diagnosed? This condition may be diagnosed based on: A physical exam. Your medical history. Blood tests. A test of a fluid sample (culture) from an open sore. How is this treated? There is no cure for this condition, but treatment with antiviral medicines can do the following: Speed up healing and relieve symptoms. Help to reduce the spread of the virus to sexual partners. Limit the chance of future outbreaks, or make future outbreaks shorter. Lessen symptoms of future outbreaks. Your health care provider may also recommend over-the-counter medicines to help with pain and itching. Follow these instructions at home: If you have an outbreak:  Keep the affected areas dry and clean. Avoid rubbing or touching blisters and sores. If you do touch blisters or sores: Wash your hands thoroughly with soap and water for at least 20 seconds. If soap and water are not available, use an alcohol-based hand sanitizer. Do not touch your eyes afterward. Sexual activity Do not have sexual contact during active outbreaks. Practice safe sex. Herpes can spread even if your partner does not have blisters or sores. Latex or polyurethane condoms and female condoms may help prevent the spread of the herpes virus. Managing pain and discomfort If directed, put ice on the painful area. To do this: Put ice in a plastic bag. Place a towel between your skin and the bag. Leave the ice on for 20 minutes, 2-3 times a day. Remove the ice if your skin turns bright red. This is very important. If you cannot feel pain, heat, or   cold, you have a greater risk of damage to the area. If told, take a cool sitz bath to help relieve pain or itching. A sitz bath is a water bath that you take while sitting down in water that is deep enough to cover your hips and buttocks. General instructions Take over-the-counter and  prescription medicines only as told by your health care provider. If you were prescribed an antiviral medicine, use it as told by your health care provider. Do not stop using the antiviral even if you start to feel better. Keep all follow-up visits. This is important. How is this prevented? Use condoms. Although you can get genital herpes during sexual contact even with the use of a condom, a condom can provide some protection. Avoid having multiple sexual partners. Talk with your sexual partner about any symptoms either of you may have. Also, talk with your partner about any history of STIs. Do not have sexual contact if you have active symptoms of genital herpes. Contact a health care provider if: Your symptoms are not improving with medicine. Your symptoms return, or you have new symptoms. You have a fever. You have abdominal pain. You have redness, swelling, or pain in your eye. You notice new sores on other parts of your body. You have had herpes and you become pregnant or plan to become pregnant. Get help right away if: You have symptoms of viral meningitis. This is rare but may happen if the virus spreads to the brain. Symptoms may include: Severe headache or stiff neck. Muscle aches. Nausea and vomiting. Sensitivity to light. Summary Genital herpes is a common sexually transmitted infection (STI) that is caused by the herpes simplex virus, type 1 or type 2 (HSV-1 or HSV-2). These viruses are most often spread through sexual contact with an infected person. You are more likely to develop this condition if you have sex with many partners or you do not use condoms during sex. Most people do not have symptoms or have mild symptoms that may be mistaken for other skin problems. Symptoms occur as outbreaks that may happen months or years apart. There is no cure for this condition, but treatment with oral antiviral medicines can reduce symptoms, reduce the chance of spreading the virus to  a partner, prevent future outbreaks, or shorten future outbreaks. This information is not intended to replace advice given to you by your health care provider. Make sure you discuss any questions you have with your health care provider. Document Revised: 03/21/2021 Document Reviewed: 03/21/2021 Elsevier Patient Education  2023 Elsevier Inc.  

## 2022-11-13 ENCOUNTER — Ambulatory Visit (HOSPITAL_COMMUNITY)
Admission: RE | Admit: 2022-11-13 | Discharge: 2022-11-13 | Disposition: A | Discharge status: Home / Self Care | Payer: Medicare PPO | Source: Ambulatory Visit | Attending: Podiatry | Admitting: Podiatry

## 2022-11-13 DIAGNOSIS — I999 Unspecified disorder of circulatory system: Secondary | ICD-10-CM | POA: Diagnosis not present

## 2022-11-13 LAB — VAS US ABI WITH/WO TBI
Left ABI: 1.07
Right ABI: 1.06

## 2022-11-17 ENCOUNTER — Telehealth: Payer: Self-pay | Admitting: Podiatry

## 2022-11-17 ENCOUNTER — Encounter: Payer: Self-pay | Admitting: Internal Medicine

## 2022-11-17 NOTE — Telephone Encounter (Signed)
done

## 2022-11-17 NOTE — Telephone Encounter (Signed)
Patient is calling and left VM on nurse line asking for results of the doppler results. Please contact patient.

## 2022-11-18 ENCOUNTER — Encounter: Payer: Self-pay | Admitting: Nurse Practitioner

## 2022-11-19 ENCOUNTER — Ambulatory Visit: Payer: Medicare PPO | Admitting: Nurse Practitioner

## 2022-11-19 ENCOUNTER — Other Ambulatory Visit: Payer: Self-pay | Admitting: Nurse Practitioner

## 2022-11-19 ENCOUNTER — Encounter: Payer: Self-pay | Admitting: Podiatry

## 2022-11-19 DIAGNOSIS — E875 Hyperkalemia: Secondary | ICD-10-CM

## 2022-11-19 MED ORDER — ACYCLOVIR 400 MG PO TABS: 400.0000 mg | ORAL_TABLET | Freq: Three times a day (TID) | ORAL | 3 refills | Status: DC

## 2022-11-20 DIAGNOSIS — Z961 Presence of intraocular lens: Secondary | ICD-10-CM | POA: Diagnosis not present

## 2022-11-20 DIAGNOSIS — G510 Bell's palsy: Secondary | ICD-10-CM | POA: Diagnosis not present

## 2022-11-20 DIAGNOSIS — H524 Presbyopia: Secondary | ICD-10-CM | POA: Diagnosis not present

## 2022-11-21 ENCOUNTER — Ambulatory Visit (INDEPENDENT_AMBULATORY_CARE_PROVIDER_SITE_OTHER): Payer: Medicare PPO

## 2022-11-21 VITALS — BP 130/79 | HR 92 | Temp 98.0°F | Resp 18 | Ht 62.0 in | Wt 140.5 lb

## 2022-11-21 DIAGNOSIS — K5000 Crohn's disease of small intestine without complications: Secondary | ICD-10-CM | POA: Diagnosis not present

## 2022-11-21 MED ORDER — RISANKIZUMAB-RZAA 600 MG/10ML IV SOLN
600.0000 mg | Freq: Once | INTRAVENOUS | Status: AC
  Administered 2022-11-21: 600 mg via INTRAVENOUS
  Filled 2022-11-21: qty 10

## 2022-11-21 NOTE — Progress Notes (Signed)
Diagnosis: Crohn's Disease  Provider:  Chilton Greathouse MD  Procedure: IV Infusion  IV Type: Peripheral, IV Location: L Antecubital  [name] (risankizumab-rzaa), Dose: 600 mg  Infusion Start Time: 1317  Infusion Stop Time: 1418  Post Infusion IV Care: Observation period completed    PIV d/ced    Discharge: Condition: Good, Destination: Home . AVS Provided  Performed by:  Marlow Baars [place]tt, RN

## 2022-11-25 ENCOUNTER — Ambulatory Visit (HOSPITAL_BASED_OUTPATIENT_CLINIC_OR_DEPARTMENT_OTHER): Payer: Medicare PPO | Attending: Pulmonary Disease | Admitting: Pulmonary Disease

## 2022-11-25 DIAGNOSIS — G4733 Obstructive sleep apnea (adult) (pediatric): Secondary | ICD-10-CM | POA: Diagnosis not present

## 2022-11-26 DIAGNOSIS — M65311 Trigger thumb, right thumb: Secondary | ICD-10-CM | POA: Diagnosis not present

## 2022-11-26 DIAGNOSIS — M25642 Stiffness of left hand, not elsewhere classified: Secondary | ICD-10-CM | POA: Diagnosis not present

## 2022-11-26 DIAGNOSIS — M19041 Primary osteoarthritis, right hand: Secondary | ICD-10-CM | POA: Diagnosis not present

## 2022-11-26 DIAGNOSIS — M19042 Primary osteoarthritis, left hand: Secondary | ICD-10-CM | POA: Diagnosis not present

## 2022-11-26 DIAGNOSIS — M25641 Stiffness of right hand, not elsewhere classified: Secondary | ICD-10-CM | POA: Diagnosis not present

## 2022-11-26 DIAGNOSIS — M79641 Pain in right hand: Secondary | ICD-10-CM | POA: Diagnosis not present

## 2022-11-26 DIAGNOSIS — M79642 Pain in left hand: Secondary | ICD-10-CM | POA: Diagnosis not present

## 2022-11-26 DIAGNOSIS — M65312 Trigger thumb, left thumb: Secondary | ICD-10-CM | POA: Diagnosis not present

## 2022-11-28 ENCOUNTER — Ambulatory Visit (INDEPENDENT_AMBULATORY_CARE_PROVIDER_SITE_OTHER): Payer: Medicare PPO | Admitting: Podiatry

## 2022-11-28 ENCOUNTER — Encounter: Payer: Self-pay | Admitting: Podiatry

## 2022-11-28 ENCOUNTER — Telehealth: Payer: Self-pay | Admitting: Pulmonary Disease

## 2022-11-28 DIAGNOSIS — I999 Unspecified disorder of circulatory system: Secondary | ICD-10-CM | POA: Diagnosis not present

## 2022-11-28 DIAGNOSIS — M7751 Other enthesopathy of right foot: Secondary | ICD-10-CM

## 2022-11-28 DIAGNOSIS — G629 Polyneuropathy, unspecified: Secondary | ICD-10-CM | POA: Diagnosis not present

## 2022-11-28 DIAGNOSIS — G4733 Obstructive sleep apnea (adult) (pediatric): Secondary | ICD-10-CM

## 2022-11-28 NOTE — Procedures (Signed)
Patient Name: Crystal [name], Crystal [name] Date: 11/25/2022 Gender: Female D.O.B: [birth year] Age (years): 54 Referring Provider: Coralyn Helling MD, ABSM Height (inches): 62 Interpreting Physician: Coralyn Helling MD, ABSM Weight (lbs): 139 RPSGT: Ulyess Mort BMI: 25 MRN: [id number] Neck Size: 13.00  CLINICAL INFORMATION The patient is referred for hypoglossal nerve stimulator titration.  Date of HST 11/06/21: AHI 36.5, SpO2 low 73%.  SLEEP STUDY TECHNIQUE As per the AASM Manual for the Scoring of Sleep and Associated Events v2.3 (April 2016) with a hypopnea requiring 4% desaturations.  The channels recorded and monitored were frontal, central and occipital EEG, electrooculogram (EOG), submentalis EMG (chin), nasal and oral airflow, thoracic and abdominal wall motion, anterior tibialis EMG, snore microphone, electrocardiogram, and pulse oximetry. Continuous positive airway pressure (CPAP) was initiated at the beginning of the study and titrated to treat sleep-disordered breathing.  MEDICATIONS Medications self-administered by patient taken the night of the study : ACYCLOVIR, BENZONATATE, cinnamon tablet, clobetasol propionate gel, CLOTRIMAZOLE, DEXAMETHASONE, GABAPENTIN, mirlax, mupirocin ointment, REQUIP, TIZANIDINE, TYLENOL  TECHNICIAN COMMENTS Comments added by technician: PATIENT WAS ORDERED AS A INSPIRE TITRATION Comments added by scorer: N/A  RESPIRATORY PARAMETERS She was started on 1.4 V and increased to 2.2 V.  She slept on her side and did not have REM sleep.  Did best with 2.1 V.   SLEEP ARCHITECTURE The study was initiated at 11:16:56 PM and ended at 5:58:37 AM.  Sleep onset time was 8.5 minutes and the sleep efficiency was 79.1%. The total sleep time was 317.7 minutes.  The patient spent 27.9% of the night in stage N1 sleep, 72.1% in stage N2 sleep, 0.0% in stage N3 and 0% in REM.Stage REM latency was N/A minutes  Wake after sleep onset was 75.5. Alpha intrusion was absent.  Supine sleep was 0.00%.  CARDIAC DATA The 2 lead EKG demonstrated sinus rhythm. The mean heart rate was N/A beats per minute. Other EKG findings include: PVCs.  LEG MOVEMENT DATA The total Periodic Limb Movements of Sleep (PLMS) were 0. The PLMS index was 0.0. A PLMS index of <15 is considered normal in adults.  IMPRESSIONS - Best hypoglossal nerve stimulator setting 2.1 V.  DIAGNOSIS - Obstructive Sleep Apnea (G47.33)  RECOMMENDATIONS - Set hypoglossal nerve stimulator at 2.1 V. - Avoid alcohol, sedatives and other CNS depressants that may worsen sleep apnea and disrupt normal sleep architecture. - Sleep hygiene should be reviewed to assess factors that may improve sleep quality. - Weight management and regular exercise should be initiated or continued.  [Electronically signed] 11/28/2022 11:57 AM  Coralyn Helling MD, ABSM Diplomate, American Board of Sleep Medicine NPI: [id number]

## 2022-11-28 NOTE — Telephone Encounter (Signed)
Inspire titration 11/25/22 >> 2.1 V    Please let her know she did best with Inspire device set at 2.1 V.  Please have Inspire team adjust her device accordingly.

## 2022-12-01 NOTE — Progress Notes (Signed)
Subjective:   Patient ID: Crystal [name], female   DOB: 72 y.o.   MRN: [id number]   HPI Patient states she still having pain in her feet and she wanted to review her Doppler with neuropathy also as possible pathology   ROS      Objective:  Physical Exam  Neurovascular unchanged with patient having good pulses DP PT confirmed by Doppler which did indicate normal ABIs with distal deformity of the lesser digits that appears to be more distal disease with the possibility for some degree of Raynaud's or other vascular spastic disease.  No ulcers or breakdown of tissue     Assessment:  Difficult condition as her ABIs did come out relatively normal and it appears to be more of a issue of vasospasm or distal disease which is not curable     Plan:  H&P educated her on this along with her caregiver discussed at great length that she needs to do daily inspections and if any breakdowns of tissue were to occur to let us know immediately.  Patient can take extra strength Tylenol at night try soaks and stretching and see if that will help with discomfort

## 2022-12-02 NOTE — Telephone Encounter (Signed)
ATC pt left detailed VM (ok per DPR) w/ VS. Spoke to [facility] pt has upcoming appt and at that time they will change her settings. NFN att.

## 2022-12-03 DIAGNOSIS — M79642 Pain in left hand: Secondary | ICD-10-CM | POA: Diagnosis not present

## 2022-12-03 DIAGNOSIS — M19042 Primary osteoarthritis, left hand: Secondary | ICD-10-CM | POA: Diagnosis not present

## 2022-12-03 DIAGNOSIS — M25641 Stiffness of right hand, not elsewhere classified: Secondary | ICD-10-CM | POA: Diagnosis not present

## 2022-12-03 DIAGNOSIS — M25642 Stiffness of left hand, not elsewhere classified: Secondary | ICD-10-CM | POA: Diagnosis not present

## 2022-12-03 DIAGNOSIS — M79641 Pain in right hand: Secondary | ICD-10-CM | POA: Diagnosis not present

## 2022-12-03 DIAGNOSIS — M19041 Primary osteoarthritis, right hand: Secondary | ICD-10-CM | POA: Diagnosis not present

## 2022-12-05 ENCOUNTER — Encounter (HOSPITAL_BASED_OUTPATIENT_CLINIC_OR_DEPARTMENT_OTHER): Payer: Self-pay | Admitting: Pulmonary Disease

## 2022-12-05 ENCOUNTER — Ambulatory Visit (INDEPENDENT_AMBULATORY_CARE_PROVIDER_SITE_OTHER): Payer: Medicare PPO | Admitting: Pulmonary Disease

## 2022-12-05 VITALS — BP 126/80 | HR 97 | Temp 98.8°F | Ht 62.5 in | Wt 144.0 lb

## 2022-12-05 DIAGNOSIS — G4733 Obstructive sleep apnea (adult) (pediatric): Secondary | ICD-10-CM | POA: Diagnosis not present

## 2022-12-05 DIAGNOSIS — Z789 Other specified health status: Secondary | ICD-10-CM | POA: Diagnosis not present

## 2022-12-05 DIAGNOSIS — J479 Bronchiectasis, uncomplicated: Secondary | ICD-10-CM

## 2022-12-05 DIAGNOSIS — J301 Allergic rhinitis due to pollen: Secondary | ICD-10-CM

## 2022-12-05 NOTE — Progress Notes (Signed)
[facility]  Chief Complaint  Patient presents with   Follow-up    Follow up on patients inspire.     Past Surgical History:  She  has a past surgical history that includes Shoulder adhesion release; Spine surgery; Appendectomy (1960); Cesarean section (1610 & 1886); Abdominal hysterectomy (1992); Rotator cuff repair (Left, 2005); Tonsillectomy (1960); Joint replacement; Toe Surgery; Toe Surgery; Anterior cervical decompression/discectomy fusion 4 level (N/A, 06/14/2015); ORIF femur fracture (Left, 03/24/2017); Knee surgery; Back surgery; Eye surgery; Transforaminal lumbar interbody fusion w/ mis 1 level (Left, 09/05/2021); Drug induced endoscopy (N/A, 02/25/2022); Cataract extraction, bilateral; Implantation of hypoglossal nerve stimulator (Right, 05/29/2022); Finger arthroscopy; and Spinal cord decompression.  Past Medical History:  Vit D deficiency, TIA, RLS, Hypothyroidism, HTN, HLD, GERD, Fibromyalgia, Depression, CAD, Crohn's disease, Bell's palsy, Lichen planus  Constitutional:  BP 126/80 (BP Location: Left Arm, Patient Position: Sitting, Cuff Size: Normal)   Pulse 97   Temp 98.8 F (37.1 C) (Oral)   Ht 5' 2.5" (1.588 m)   Wt 144 lb (65.3 kg)   SpO2 94%   BMI 25.92 kg/m   Brief Summary:  Crystal [name] is a 72 y.o. female with obstructive sleep apnea, and bronchiectasis in the setting of reflux.      Subjective:   Had titration study.  Did best with 2.1 V.    Currently on 1.6 V w/o difficulty.  Denies tongue or neck pain.  No issues with speech or swallowing.  Has more sinus congestion from allergies.  Not having cough, wheeze, or sputum.  Her husbands snoring disrupts her sleep and she wakes up with pain symptoms.  Can take up to 30 minutes to fall back to sleep.  Physical Exam:   Appearance - well kempt, Rt facial droop  ENMT - no sinus tenderness, no oral exudate, no LAN, Mallampati 3 airway, no  stridor  Respiratory - equal breath sounds bilaterally, no wheezing or rales  CV - s1s2 regular rate and rhythm, no murmurs  Ext - no clubbing, no edema  Skin - no rashes  Psych - normal mood and affect   Pulmonary testing:  PFT 11/21/15 >> FEV1 2.51 (110%), FEV1% 85%, TLC 4.36 (88%), DLCO 77% PFT 05/16/22 >> FEV1 2.26 (107%), FEV1% 82, TLC 5.73 (116%), DLCO 81%  Chest Imaging:  CT chest 10/01/17 >> 3 mm LLL nodule, mild cylindrical BTX Rt side  Sleep Tests:  HST 12/04/15 >> 30.7, SpO2 low 73% HST 11/06/21 >> AHI 36.5, SpO2 low 73% Inspire titration 11/25/22 >> 2.1 V >> AHI 4.2  Cardiac Tests:  Echo 02/28/22 >> EF 55 to 60%, mild MR  Social History:  She  reports that she has never smoked. She has been exposed to tobacco smoke. She has never used smokeless tobacco. She reports current alcohol use of about 2.0 standard drinks of alcohol per week. She reports that she does not use drugs.  Family History:  Her family history includes Arthritis in her mother; Breast cancer in her mother; Cancer in her mother; Depression in her sister; Diabetes in her mother; Heart disease in her father; Hyperlipidemia in her father; Hypertension in her father; Leukemia in her sister; Lupus in her sister; Migraines in her daughter; Neuropathy in her father; Osteoporosis in her mother; Rheum arthritis in her sister.     Assessment/Plan:   Obstructive sleep apnea. - intolerant of CPAP - had Inspire implanted in November 2023 - device activated 07/18/22 - titration study on 11/25/22 -  will set device to 1.7 V with plan to titrate up to 2.1 V over the next 5 weeks as tolerated - adjusted the pause setting to 30 minutes  Regional bronchiectasis in setting of reflux. - prn antitussive and expectorants  Allergic rhinitis. - add nasal irrigation - continue flonase  Crohn's colitis. - followed by Dr. Ileene Patrick with [facility]  Lichen planus. - followed by Dr Inis Sizer with  dermatology  Fibromyalgia, arthritis. - followed by Dr. Casimer Lanius with rheumatology   Time Spent Involved in Patient Care on Day of Examination:  27 minutes  Follow up:   Patient Instructions  Try going up by one level every week as tolerated until you get to level 5 (2.1 volts).  Try using saline nasal rinse before using flonase.  Follow up in 6 months.  Medication List:   Allergies as of 12/05/2022       Reactions   Methocarbamol Shortness Of Breath, Nausea Only, Other (See Comments)   Other reaction(s): Dizziness, psychological reaction, disoriented Sleepy and weight loss   Codeine Other (See Comments)   Headache   Statins Other (See Comments)   Extreme pain   Azathioprine    Other Other (See Comments)   MINT=sore mouth and stomach upset   Oxycodone    Dizziness, unbalanced.   Amitriptyline Other (See Comments)   Confusion and hallucinations   Erythromycin Base Rash   Flushing/red face.   Fish Oil Nausea Only   Flax Seed [flax Seed Oil] Rash   Flaxseed (linseed) Rash   Hydrocodone Itching, Rash      Morphine Nausea And Vomiting, Rash   Nsaids Nausea Only   Nuvigil [armodafinil] Other (See Comments)   Unspecified   Sertraline Rash   Sinemet [carbidopa W-levodopa] Rash   Sulfa Antibiotics Rash   Tolmetin Nausea Only        Medication List        Accurate as of December 05, 2022  2:30 PM. If you have any questions, ask your nurse or doctor.          acyclovir 400 MG tablet Commonly known as: ZOVIRAX Take 1 tablet (400 mg total) by mouth 3 (three) times daily.   BIOTIN PO Take by mouth.   CALCIUM 600 PO Take by mouth.   carboxymethylcellulose 0.5 % Soln Commonly known as: REFRESH PLUS Place 1 drop into both eyes 3 (three) times daily as needed (dry eyes).   CINNAMON PO Take by mouth.   clobetasol ointment 0.05 % Commonly known as: TEMOVATE Apply 1 Application topically 3 (three) times daily as needed (lichen planus).   clotrimazole 10  MG troche Commonly known as: MYCELEX Take 10 mg by mouth 3 (three) times daily.   dexamethasone 0.5 MG/5ML solution Commonly known as: DECADRON Take 5 mLs (0.5 mg total) by mouth 3 (three) times daily.   diclofenac Sodium 1 % Gel Commonly known as: VOLTAREN Apply 2 g topically daily as needed (pain).   DULoxetine 60 MG capsule Commonly known as: CYMBALTA Take 60 mg by mouth daily.   fenofibrate micronized 134 MG capsule Commonly known as: LOFIBRA TAKE 1 CAPSULE EVERY DAY BEFORE BREAKFAST   fluconazole 150 MG tablet Commonly known as: Diflucan Take 1 tablet (150 mg) at the sign of symptoms.  If not resolved after 72 hours may take additional 150 mg dosage.   fluticasone 50 MCG/ACT nasal spray Commonly known as: FLONASE USE TWO SPRAYS IN EACH NOSTRIL DAILY AS NEEDED What changed:  how much  to take how to take this when to take this additional instructions   gabapentin 300 MG capsule Commonly known as: NEURONTIN Take 1 capsule 4 x /day for Chronic Pain         ( Dx: m79.7)                                               /                                TAKE                                   BY                                  MOUTH   IRON PO Take 25 mg by mouth daily.   levothyroxine 88 MCG tablet Commonly known as: Synthroid Take  1 tablet  Daily  on an empty stomach with only water for 30 minutes & no Antacid meds, Calcium or Magnesium for 4 hours & avoid Biotin   meclizine 25 MG tablet Commonly known as: ANTIVERT TAKE 1 TABLET (25 MG TOTAL) BY MOUTH 3 (THREE) TIMES DAILY AS NEEDED FOR DIZZINESS.   mupirocin ointment 2 % Commonly known as: BACTROBAN Apply 1 Application topically 3 (three) times daily as needed (lichen planus).   neomycin-polymyxin-hydrocortisone OTIC solution Commonly known as: CORTISPORIN Place 3 drops into both ears daily as needed (ear inflammation).   omeprazole 40 MG capsule Commonly known as: PRILOSEC Take 1 capsule (40 mg total) by mouth  daily.   OVER THE COUNTER MEDICATION Place 1 drop under the tongue daily. D3 5000 units - K2 120 mcg   polyethylene glycol 17 g packet Commonly known as: MIRALAX / GLYCOLAX Take 17-34 g by mouth at bedtime.   rOPINIRole 2 MG tablet Commonly known as: REQUIP Take  1 tablet  2 x / day  &  2 tablets  at Bedtime  for Restless Legs   tiZANidine 2 MG tablet Commonly known as: ZANAFLEX Take 2 mg by mouth at bedtime.   traMADol 50 MG tablet Commonly known as: ULTRAM Take 50 mg by mouth every 6 (six) hours as needed for moderate pain or severe pain.   TURMERIC PLUS BLACK PEPPER EXT PO Take by mouth.        Signature:  Coralyn Helling, MD [facility]Critical [facility] Pager - [phone] 12/05/2022, 2:30 PM

## 2022-12-05 NOTE — Patient Instructions (Signed)
Try going up by one level every week as tolerated until you get to level 5 (2.1 volts).  Try using saline nasal rinse before using flonase.  Follow up in 6 months.

## 2022-12-09 ENCOUNTER — Other Ambulatory Visit: Payer: Self-pay

## 2022-12-09 ENCOUNTER — Other Ambulatory Visit (INDEPENDENT_AMBULATORY_CARE_PROVIDER_SITE_OTHER): Payer: Medicare PPO

## 2022-12-09 VITALS — BP 132/78 | HR 112 | Temp 97.8°F | Ht 62.0 in | Wt 142.0 lb

## 2022-12-09 DIAGNOSIS — E875 Hyperkalemia: Secondary | ICD-10-CM | POA: Diagnosis not present

## 2022-12-09 NOTE — Progress Notes (Signed)
Patient presents to the office for a nurse visit to have labs done to recheck Potassium levels.   

## 2022-12-10 DIAGNOSIS — M79642 Pain in left hand: Secondary | ICD-10-CM | POA: Diagnosis not present

## 2022-12-10 DIAGNOSIS — M79641 Pain in right hand: Secondary | ICD-10-CM | POA: Diagnosis not present

## 2022-12-10 DIAGNOSIS — M79644 Pain in right finger(s): Secondary | ICD-10-CM | POA: Diagnosis not present

## 2022-12-10 LAB — POTASSIUM: Potassium: 4.7 mmol/L (ref 3.5–5.3)

## 2022-12-11 ENCOUNTER — Other Ambulatory Visit: Payer: Self-pay | Admitting: Internal Medicine

## 2022-12-11 DIAGNOSIS — M25512 Pain in left shoulder: Secondary | ICD-10-CM | POA: Diagnosis not present

## 2022-12-11 MED ORDER — TIZANIDINE HCL 2 MG PO TABS: ORAL_TABLET | ORAL | 0 refills | Status: DC

## 2022-12-12 ENCOUNTER — Other Ambulatory Visit: Payer: Self-pay | Admitting: Orthopaedic Surgery

## 2022-12-12 DIAGNOSIS — M79644 Pain in right finger(s): Secondary | ICD-10-CM | POA: Diagnosis not present

## 2022-12-12 DIAGNOSIS — M25641 Stiffness of right hand, not elsewhere classified: Secondary | ICD-10-CM | POA: Diagnosis not present

## 2022-12-12 DIAGNOSIS — Z96612 Presence of left artificial shoulder joint: Secondary | ICD-10-CM

## 2022-12-15 ENCOUNTER — Ambulatory Visit (HOSPITAL_BASED_OUTPATIENT_CLINIC_OR_DEPARTMENT_OTHER): Payer: Medicare PPO | Admitting: Pulmonary Disease

## 2022-12-16 ENCOUNTER — Telehealth: Payer: Self-pay | Admitting: Gastroenterology

## 2022-12-16 NOTE — Telephone Encounter (Signed)
Returned call to patient. Pt reports that she had a "little BM" today, before that it had been a few days and she had to manually disimpact herself. Pt also reports that her hemorrhoids are inflamed. Pt's current regimen include 1 Colace at night and 1 capful of Miralax daily. I explained to patient that she can take Miralax up to TID. Pt has also been advised to try a fleet enema if she is feeling a lot of pressure and hard stool at the rectum. Once hard stool is removed then her stools should become easier to pass with increased Miralax. Regarding her hemorrhoids patient has been advised to get Preparation H suppositories OTC and use 1-2 times a day for a few days. I explained to patient that she will ultimately need to get her constipation under control to limit irritation of hemorrhoids. Pt verbalized understanding of all recommendations and had no concerns at the end of the call.

## 2022-12-16 NOTE — Telephone Encounter (Signed)
Inbound call from patient stating she has been severely constipated. States she did try Miralax but it has not helped. Requesting a call back to discuss what other options could help. Please advise, thank you.

## 2022-12-18 ENCOUNTER — Other Ambulatory Visit: Payer: Self-pay | Admitting: Nurse Practitioner

## 2022-12-19 ENCOUNTER — Ambulatory Visit (INDEPENDENT_AMBULATORY_CARE_PROVIDER_SITE_OTHER): Payer: Medicare PPO | Admitting: *Deleted

## 2022-12-19 VITALS — BP 139/82 | HR 81 | Temp 98.1°F | Resp 16 | Ht 62.0 in | Wt 148.2 lb

## 2022-12-19 DIAGNOSIS — K5000 Crohn's disease of small intestine without complications: Secondary | ICD-10-CM | POA: Diagnosis not present

## 2022-12-19 MED ORDER — RISANKIZUMAB-RZAA 600 MG/10ML IV SOLN
600.0000 mg | Freq: Once | INTRAVENOUS | Status: AC
  Administered 2022-12-19: 600 mg via INTRAVENOUS
  Filled 2022-12-19: qty 10

## 2022-12-19 NOTE — Progress Notes (Signed)
Diagnosis: Crohn's Disease  Provider:  Chilton Greathouse MD  Procedure: IV Infusion  IV Type: Peripheral, IV Location: L Antecubital  [name] (risankizumab-rzaa), Dose: 600 mg  Infusion Start Time: 1321 pm  Infusion Stop Time: 1425 pm  Post Infusion IV Care: Observation period completed and Peripheral IV Discontinued  Discharge: Condition: Good, Destination: Home . AVS Declined  Performed by:  Forrest Moron, RN

## 2022-12-24 DIAGNOSIS — M79644 Pain in right finger(s): Secondary | ICD-10-CM | POA: Diagnosis not present

## 2022-12-24 DIAGNOSIS — M5416 Radiculopathy, lumbar region: Secondary | ICD-10-CM | POA: Diagnosis not present

## 2022-12-24 DIAGNOSIS — M48061 Spinal stenosis, lumbar region without neurogenic claudication: Secondary | ICD-10-CM | POA: Diagnosis not present

## 2022-12-24 DIAGNOSIS — Z6825 Body mass index (BMI) 25.0-25.9, adult: Secondary | ICD-10-CM | POA: Diagnosis not present

## 2022-12-24 DIAGNOSIS — M25641 Stiffness of right hand, not elsewhere classified: Secondary | ICD-10-CM | POA: Diagnosis not present

## 2022-12-24 DIAGNOSIS — M79642 Pain in left hand: Secondary | ICD-10-CM | POA: Diagnosis not present

## 2022-12-24 DIAGNOSIS — M79641 Pain in right hand: Secondary | ICD-10-CM | POA: Diagnosis not present

## 2022-12-24 NOTE — Telephone Encounter (Signed)
Patient call returned, no answer. LMTCB

## 2022-12-24 NOTE — Telephone Encounter (Signed)
Inbound call from patient requesting a call back in regards last conversation you guys had.Please advise

## 2022-12-25 ENCOUNTER — Telehealth: Payer: Self-pay | Admitting: *Deleted

## 2022-12-25 DIAGNOSIS — M48062 Spinal stenosis, lumbar region with neurogenic claudication: Secondary | ICD-10-CM | POA: Diagnosis not present

## 2022-12-25 DIAGNOSIS — R260 Ataxic gait: Secondary | ICD-10-CM | POA: Diagnosis not present

## 2022-12-25 DIAGNOSIS — M79604 Pain in right leg: Secondary | ICD-10-CM | POA: Diagnosis not present

## 2022-12-25 NOTE — Telephone Encounter (Signed)
Patient called and messaged left on vm per DPR. Patient was informed the same information that was told to her 9 days ago. Unable to find out if she followed recommendations at this time.  Patient had been informed to first administer an enema to rid stool at rectum and increase her Miralax to TID and maintain her colace daily. Patient was also informed to take OTC Preparation H suppositories for a few days to elevate the swelling and discomfort and if these suggestions do not work, please call back.

## 2022-12-25 NOTE — Telephone Encounter (Signed)
Patient called back to discuss the continued constipation after having talked with the nurse 9 days ago. Patient states she has not done the enema, although she was also informed to increase the Miralax to TID and apply the Preparation H to hemorrhoids. Patient states the hemorrhoidal suppositories help, but still having constipation. Again, informed the patient to first do the enema, while increasing Miralax and if no improvement to call back and we will try something else. Patient agreed.

## 2023-01-02 ENCOUNTER — Encounter: Payer: Self-pay | Admitting: Gastroenterology

## 2023-01-04 ENCOUNTER — Other Ambulatory Visit: Payer: Self-pay | Admitting: Internal Medicine

## 2023-01-04 DIAGNOSIS — R059 Cough, unspecified: Secondary | ICD-10-CM

## 2023-01-04 DIAGNOSIS — J479 Bronchiectasis, uncomplicated: Secondary | ICD-10-CM

## 2023-01-04 MED ORDER — BENZONATATE 200 MG PO CAPS
ORAL_CAPSULE | ORAL | 0 refills | Status: DC
Start: 2023-01-04 — End: 2023-06-01

## 2023-01-08 ENCOUNTER — Inpatient Hospital Stay: Admission: RE | Admit: 2023-01-08 | Payer: Medicare PPO | Source: Ambulatory Visit

## 2023-01-13 DIAGNOSIS — M79604 Pain in right leg: Secondary | ICD-10-CM | POA: Diagnosis not present

## 2023-01-13 DIAGNOSIS — M79644 Pain in right finger(s): Secondary | ICD-10-CM | POA: Diagnosis not present

## 2023-01-13 DIAGNOSIS — M48062 Spinal stenosis, lumbar region with neurogenic claudication: Secondary | ICD-10-CM | POA: Diagnosis not present

## 2023-01-13 DIAGNOSIS — R260 Ataxic gait: Secondary | ICD-10-CM | POA: Diagnosis not present

## 2023-01-14 ENCOUNTER — Telehealth: Payer: Self-pay | Admitting: Gastroenterology

## 2023-01-14 ENCOUNTER — Other Ambulatory Visit: Payer: Self-pay | Admitting: Nurse Practitioner

## 2023-01-14 ENCOUNTER — Other Ambulatory Visit: Payer: Self-pay | Admitting: Internal Medicine

## 2023-01-14 DIAGNOSIS — M25641 Stiffness of right hand, not elsewhere classified: Secondary | ICD-10-CM | POA: Diagnosis not present

## 2023-01-14 DIAGNOSIS — B3731 Acute candidiasis of vulva and vagina: Secondary | ICD-10-CM

## 2023-01-14 DIAGNOSIS — M180 Bilateral primary osteoarthritis of first carpometacarpal joints: Secondary | ICD-10-CM | POA: Diagnosis not present

## 2023-01-14 DIAGNOSIS — M79642 Pain in left hand: Secondary | ICD-10-CM | POA: Diagnosis not present

## 2023-01-14 DIAGNOSIS — M79644 Pain in right finger(s): Secondary | ICD-10-CM | POA: Diagnosis not present

## 2023-01-14 DIAGNOSIS — M79641 Pain in right hand: Secondary | ICD-10-CM | POA: Diagnosis not present

## 2023-01-14 NOTE — Telephone Encounter (Signed)
Inbound call from patient requesting a call back from a nurse , patient stated she is having her infusion on Friday 7/19 and she want to know if she will be able to  go to her appt because  she is having some inching and some  "vaginal infections". I referred patient to get in touch with her OBGYN for any vaginal symptoms or uti, but patient insist to speak with you for the matter..Please advise.

## 2023-01-14 NOTE — Telephone Encounter (Signed)
[name] can you clarify if this is her first infusion or her second? She recently started [employer].  She should call her PCP or GYN to determine what she needs for treatment. If she has already started [place] this is in her system for several weeks and if only a yeast/candida infection and mild I think okay to continue [employer]. If she has a significant infection in the hospital, etc, that is a different issue and would hold it in that setting.

## 2023-01-14 NOTE — Telephone Encounter (Signed)
Called and spoke with patient. This will be patient's 3rd [name] infusion, since this is recurrent urinary infection she has requested meds from ehr PCP. She will proceed with [name] infusion on Friday as scheduled. Pt understands that infection requires hospitalization she will need to postpone [name] infusion. Patient verbalized understanding and had no concerns at the end of the call.

## 2023-01-14 NOTE — Telephone Encounter (Signed)
Please see note below. Patient is on [employer]. Thanks

## 2023-01-15 ENCOUNTER — Telehealth: Payer: Self-pay | Admitting: Nurse Practitioner

## 2023-01-15 ENCOUNTER — Encounter: Payer: Self-pay | Admitting: Gastroenterology

## 2023-01-15 ENCOUNTER — Other Ambulatory Visit: Payer: Self-pay | Admitting: Nurse Practitioner

## 2023-01-15 DIAGNOSIS — Z96659 Presence of unspecified artificial knee joint: Secondary | ICD-10-CM

## 2023-01-15 MED ORDER — FLUCONAZOLE 150 MG PO TABS
ORAL_TABLET | ORAL | 0 refills | Status: DC
Start: 2023-01-15 — End: 2023-01-21

## 2023-01-15 NOTE — Telephone Encounter (Signed)
Done

## 2023-01-15 NOTE — Telephone Encounter (Signed)
Pt said she was seen for a yeast infection last month, but the yeast infection still has not gone away, so she is requesting a refill on the script. She could not remember what the script is called.[name]

## 2023-01-16 ENCOUNTER — Telehealth: Payer: Self-pay | Admitting: Gastroenterology

## 2023-01-16 DIAGNOSIS — Z79899 Other long term (current) drug therapy: Secondary | ICD-10-CM

## 2023-01-16 DIAGNOSIS — K50018 Crohn's disease of small intestine with other complication: Secondary | ICD-10-CM

## 2023-01-16 DIAGNOSIS — L439 Lichen planus, unspecified: Secondary | ICD-10-CM

## 2023-01-16 NOTE — Telephone Encounter (Signed)
We can do a CBC and CMET in Sept or so, she had labs in April which looked okay. Thanks

## 2023-01-16 NOTE — Telephone Encounter (Signed)
Will patient need routine labs prior to 05/2023 appt?

## 2023-01-16 NOTE — Telephone Encounter (Signed)
Received MyChart message.  Patient wants to know if she needs to get lab work done sometime shortly before her appointment with Dr. Adela Lank which is scheduled for 05/12/23 at 1:20 p.m.  Please advise.  Thank you.

## 2023-01-16 NOTE — Telephone Encounter (Signed)
MyChart message sent to patient with recommendations.  

## 2023-01-19 ENCOUNTER — Ambulatory Visit (INDEPENDENT_AMBULATORY_CARE_PROVIDER_SITE_OTHER): Payer: Medicare PPO

## 2023-01-19 ENCOUNTER — Ambulatory Visit: Payer: Medicare PPO | Admitting: Nurse Practitioner

## 2023-01-19 VITALS — BP 150/87 | HR 85 | Temp 98.1°F | Resp 18 | Ht 62.0 in | Wt 149.2 lb

## 2023-01-19 DIAGNOSIS — K5000 Crohn's disease of small intestine without complications: Secondary | ICD-10-CM

## 2023-01-19 MED ORDER — RISANKIZUMAB-RZAA 600 MG/10ML IV SOLN
600.0000 mg | Freq: Once | INTRAVENOUS | Status: AC
  Administered 2023-01-19: 600 mg via INTRAVENOUS
  Filled 2023-01-19: qty 10

## 2023-01-19 NOTE — Progress Notes (Signed)
Diagnosis: Crohn's Disease  Provider:  Chilton Greathouse MD  Procedure: IV Infusion  IV Type: Peripheral, IV Location: R Antecubital  [name] (risankizumab-rzaa), Dose: 600 mg  Infusion Start Time: 1516  Infusion Stop Time: 1620  Post Infusion IV Care: Peripheral IV Discontinued  Discharge: Condition: Good, Destination: Home . AVS Provided  Performed by:  Nat Math, RN

## 2023-01-20 ENCOUNTER — Encounter (HOSPITAL_BASED_OUTPATIENT_CLINIC_OR_DEPARTMENT_OTHER): Payer: Medicare PPO | Admitting: Pulmonary Disease

## 2023-01-20 NOTE — Progress Notes (Deleted)
Assessment and Plan:  There are no diagnoses linked to this encounter.    Further disposition pending results of labs. Discussed med's effects and SE's.   Over 30 minutes of exam, counseling, chart review, and critical decision making was performed.   Future Appointments  Date Time Provider Department Center  01/21/2023  2:15 PM Raynelle Dick, NP [facility] None  02/05/2023  1:20 PM GI-315 CT 1 [facility]315[facility]  05/13/2023 10:30 AM Armbruster, Willaim Rayas, MD [facility]  05/18/2023  2:00 PM [name], MD [facility] None    ------------------------------------------------------------------------------------------------------------------   HPI There were no vitals taken for this visit. 72 y.o.female presents for  Past Medical History:  Diagnosis Date   Arthritis    ASCVD (arteriosclerotic cardiovascular disease)    Closed nondisplaced subtrochanteric fracture of left femur (HCC) 09/03/2017   September 2018.  Status post surgery by Dr. Carola Frost   Crohn's disease of ileum without complication (HCC)    DDD (degenerative disc disease), lumbar    Depression    Dyspnea    Eye infection    Fibromyalgia    Gait disorder 10/26/2012   GERD (gastroesophageal reflux disease)    GI bleed    Hyperlipidemia    Hypertension    Hypothyroid    Periprosthetic supracondylar fracture of femur, left  03/23/2017   Pneumonia    Pre-diabetes    Restless leg syndrome    Rhinitis 06/25/2010   Sleep apnea    does not use CPAP   Spinal headache    with the C section   TIA (transient ischemic attack)    3         Vitamin D deficiency      Allergies  Allergen Reactions   Methocarbamol Shortness Of Breath, Nausea Only and Other (See Comments)    Other reaction(s): Dizziness, psychological reaction, disoriented Sleepy and weight loss    Codeine Other (See Comments)    Headache   Statins Other (See Comments)    Extreme pain   Azathioprine    Other Other (See Comments)     MINT=sore mouth and stomach upset   Oxycodone     Dizziness, unbalanced.   Amitriptyline Other (See Comments)    Confusion and hallucinations   Erythromycin Base Rash    Flushing/red face.   Fish Oil Nausea Only   Flax Seed [Flax Seed Oil] Rash   Flaxseed (Linseed) Rash   Hydrocodone Itching and Rash        Morphine Nausea And Vomiting and Rash   Nsaids Nausea Only   Nuvigil [Armodafinil] Other (See Comments)    Unspecified   Sertraline Rash   Sinemet [Carbidopa W-Levodopa] Rash   Sulfa Antibiotics Rash   Tolmetin Nausea Only    Current Outpatient Medications on File Prior to Visit  Medication Sig   acyclovir (ZOVIRAX) 400 MG tablet Take 1 tablet (400 mg total) by mouth 3 (three) times daily.   benzonatate (TESSALON) 200 MG capsule Take 1 perle up to 3 x/day ONLY if Needed for Severe Cough               ( DX:  r05.9 )                                  /  TAKE                                         BY                                                 MOUTH   BIOTIN PO Take by mouth.   Black Pepper-Turmeric (TURMERIC PLUS BLACK PEPPER EXT PO) Take by mouth.   Calcium Carbonate (CALCIUM 600 PO) Take by mouth.   carboxymethylcellulose (REFRESH PLUS) 0.5 % SOLN Place 1 drop into both eyes 3 (three) times daily as needed (dry eyes).   CINNAMON PO Take by mouth.   clobetasol ointment (TEMOVATE) 0.05 % Apply 1 Application topically 3 (three) times daily as needed (lichen planus).   clotrimazole (MYCELEX) 10 MG troche Take 10 mg by mouth 3 (three) times daily.   dexamethasone (DECADRON) 0.5 MG/5ML solution Take 5 mLs (0.5 mg total) by mouth 3 (three) times daily.   diclofenac Sodium (VOLTAREN) 1 % GEL Apply 2 g topically daily as needed (pain).   DULoxetine (CYMBALTA) 60 MG capsule Take 60 mg by mouth daily.   fenofibrate micronized (LOFIBRA) 134 MG capsule TAKE 1 CAPSULE EVERY DAY BEFORE BREAKFAST   Ferrous Sulfate (IRON  PO) Take 25 mg by mouth daily.   fluconazole (DIFLUCAN) 150 MG tablet Take one tablet at onset of symptoms and second tablet three days later for yeast.   fluticasone (FLONASE) 50 MCG/ACT nasal spray USE TWO SPRAYS IN EACH NOSTRIL DAILY AS NEEDED (Patient taking differently: Place 2 sprays into both nostrils at bedtime.)   gabapentin (NEURONTIN) 300 MG capsule Take 1 capsule 4 x /day for Chronic Pain         ( Dx: m79.7)                                               /                                TAKE                                   BY                                  MOUTH   levothyroxine (SYNTHROID) 88 MCG tablet Take  1 tablet  Daily  on an empty stomach with only water for 30 minutes & no Antacid meds, Calcium or Magnesium for 4 hours & avoid Biotin   meclizine (ANTIVERT) 25 MG tablet TAKE 1 TABLET (25 MG TOTAL) BY MOUTH 3 (THREE) TIMES DAILY AS NEEDED FOR DIZZINESS.   mupirocin ointment (BACTROBAN) 2 % Apply 1 Application topically 3 (three) times daily as needed (lichen planus).   neomycin-polymyxin-hydrocortisone (CORTISPORIN) OTIC solution Place 3 drops into both ears daily as needed (ear inflammation).   omeprazole (PRILOSEC) 40 MG capsule TAKE 1 CAPSULE EVERY DAY   OVER THE COUNTER MEDICATION  Place 1 drop under the tongue daily. D3 5000 units - K2 120 mcg   polyethylene glycol (MIRALAX / GLYCOLAX) 17 g packet Take 17-34 g by mouth at bedtime.   rOPINIRole (REQUIP) 2 MG tablet Take  1 tablet  2 x / day  &  2 tablets  at Bedtime  for Restless Legs   tiZANidine (ZANAFLEX) 2 MG tablet TAKE 1 TABLET AT BEDTIME FOR MUSCLE SPASM   traMADol (ULTRAM) 50 MG tablet Take 50 mg by mouth every 6 (six) hours as needed for moderate pain or severe pain.   No current facility-administered medications on file prior to visit.    ROS: all negative except above.   Physical Exam:  There were no vitals taken for this visit.  General Appearance: Well nourished, in no apparent distress. Eyes: PERRLA,  EOMs, conjunctiva no swelling or erythema Sinuses: No Frontal/maxillary tenderness ENT/Mouth: Ext aud canals clear, TMs without erythema, bulging. No erythema, swelling, or exudate on post pharynx.  Tonsils not swollen or erythematous. Hearing normal.  Neck: Supple, thyroid normal.  Respiratory: Respiratory effort normal, BS equal bilaterally without rales, rhonchi, wheezing or stridor.  Cardio: RRR with no MRGs. Brisk peripheral pulses without edema.  Abdomen: Soft, + BS.  Non tender, no guarding, rebound, hernias, masses. Lymphatics: Non tender without lymphadenopathy.  Musculoskeletal: Full ROM, 5/5 strength, normal gait.  Skin: Warm, dry without rashes, lesions, ecchymosis.  Neuro: Cranial nerves intact. Normal muscle tone, no cerebellar symptoms. Sensation intact.  Psych: Awake and oriented X 3, normal affect, Insight and Judgment appropriate.     Raynelle Dick, NP 12:18 PM [facility]

## 2023-01-21 ENCOUNTER — Encounter: Payer: Self-pay | Admitting: Nurse Practitioner

## 2023-01-21 ENCOUNTER — Ambulatory Visit (INDEPENDENT_AMBULATORY_CARE_PROVIDER_SITE_OTHER): Payer: Medicare PPO | Admitting: Nurse Practitioner

## 2023-01-21 ENCOUNTER — Ambulatory Visit: Payer: Medicare PPO | Admitting: Nurse Practitioner

## 2023-01-21 VITALS — BP 146/82 | HR 106 | Temp 97.7°F

## 2023-01-21 DIAGNOSIS — G8929 Other chronic pain: Secondary | ICD-10-CM | POA: Diagnosis not present

## 2023-01-21 DIAGNOSIS — R300 Dysuria: Secondary | ICD-10-CM | POA: Diagnosis not present

## 2023-01-21 DIAGNOSIS — E875 Hyperkalemia: Secondary | ICD-10-CM

## 2023-01-21 DIAGNOSIS — E785 Hyperlipidemia, unspecified: Secondary | ICD-10-CM | POA: Diagnosis not present

## 2023-01-21 DIAGNOSIS — B009 Herpesviral infection, unspecified: Secondary | ICD-10-CM | POA: Diagnosis not present

## 2023-01-21 DIAGNOSIS — Z79899 Other long term (current) drug therapy: Secondary | ICD-10-CM

## 2023-01-21 DIAGNOSIS — E1169 Type 2 diabetes mellitus with other specified complication: Secondary | ICD-10-CM

## 2023-01-21 DIAGNOSIS — N898 Other specified noninflammatory disorders of vagina: Secondary | ICD-10-CM

## 2023-01-21 DIAGNOSIS — I10 Essential (primary) hypertension: Secondary | ICD-10-CM

## 2023-01-21 DIAGNOSIS — B372 Candidiasis of skin and nail: Secondary | ICD-10-CM

## 2023-01-21 DIAGNOSIS — M545 Low back pain, unspecified: Secondary | ICD-10-CM

## 2023-01-21 LAB — CBC WITH DIFFERENTIAL/PLATELET
Absolute Monocytes: 541 cells/uL (ref 200–950)
Basophils Relative: 0.9 %
Eosinophils Absolute: 92 cells/uL (ref 15–500)
Eosinophils Relative: 1.4 %
HCT: 44.7 % (ref 35.0–45.0)
Hemoglobin: 15 g/dL (ref 11.7–15.5)
MPV: 9.2 fL (ref 7.5–12.5)
Monocytes Relative: 8.2 %
Neutro Abs: 4508 cells/uL (ref 1500–7800)
Neutrophils Relative %: 68.3 %
RBC: 4.87 10*6/uL (ref 3.80–5.10)
RDW: 13.5 % (ref 11.0–15.0)
WBC: 6.6 10*3/uL (ref 3.8–10.8)

## 2023-01-21 MED ORDER — BISOPROLOL-HYDROCHLOROTHIAZIDE 10-6.25 MG PO TABS
1.0000 | ORAL_TABLET | Freq: Every day | ORAL | 2 refills | Status: DC
Start: 2023-01-21 — End: 2023-04-16

## 2023-01-21 MED ORDER — ACYCLOVIR 400 MG PO TABS
400.0000 mg | ORAL_TABLET | Freq: Every day | ORAL | 3 refills | Status: DC
Start: 2023-01-21 — End: 2023-04-16

## 2023-01-21 MED ORDER — NYSTATIN 100000 UNIT/GM EX CREA
1.0000 | TOPICAL_CREAM | Freq: Two times a day (BID) | CUTANEOUS | 0 refills | Status: DC
Start: 2023-01-21 — End: 2023-11-04

## 2023-01-21 NOTE — Progress Notes (Signed)
Assessment and Plan:  Crystal [name] was seen today for an episodic visit.  Diagnoses and all order for this visit:  Herpes simplex type 2 infection Not currently taking Acyclovir Start taking as directed to reduce symptoms outbreak Continue to monitor  - CBC with Differential/Platelet  Dysuria/Back pain Stay well hydrated to keep urinary system well flushed Consider daily cranberry juice or oral supplement to help any bacteria from adhering to bladder wall causing increase for infection. Monitor for any increase in fever, chills, N/V, abdominal pain, hematuria.   Contact office or report to ER for further evaluation if s/s fail to improve or any sign of worsening infection as noted above.  - CBC with Differential/Platelet - Urinalysis, Routine w reflex microscopic - Urine Culture  Hyperlipidemia associated with type 2 diabetes mellitus (HCC) Discussed lifestyle modifications. Recommended diet heavy in fruits and veggies, omega 3's. Decrease consumption of animal meats, cheeses, and dairy products. Remain active and exercise as tolerated. Continue to monitor. Check lipids/TSH  - Lipid panel  Medication management All medications discussed and reviewed in full. All questions and concerns regarding medications addressed.    - CBC with Differential/Platelet - COMPLETE METABOLIC PANEL WITH GFR - acyclovir (ZOVIRAX) 400 MG tablet; Take 1 tablet (400 mg total) by mouth daily.  Dispense: 30 tablet; Refill: 3 - bisoprolol-hydrochlorothiazide (ZIAC) 10-6.25 MG tablet; Take 1 tablet by mouth daily.  Dispense: 30 tablet; Refill: 2 - Lipid panel - Urinalysis, Routine w reflex microscopic - Urine Culture  Hyperkalemia  - COMPLETE METABOLIC PANEL WITH GFR  Skin candidiasis Keep skin clean and dry - discussed hygiene. Continue to monitor  - nystatin cream (MYCOSTATIN); Apply 1 Application topically 2 (two) times daily.  Dispense: 30 g; Refill:  0  Hypertension Restart Ziac Discussed DASH (Dietary Approaches to Stop Hypertension) DASH diet is lower in sodium than a typical American diet. Cut back on foods that are high in saturated fat, cholesterol, and trans fats. Eat more whole-grain foods, fish, poultry, and nuts Remain active and exercise as tolerated daily.  Monitor BP at home-Call if greater than 130/80.  Check CMP/CBC   Notify office for further evaluation and treatment, questions or concerns if s/s fail to improve. The risks and benefits of my recommendations, as well as other treatment options were discussed with the patient today. Questions were answered.  Further disposition pending results of labs. Discussed med's effects and SE's.    [name], counseling, chart review, and critical decision making was performed.   Future Appointments  Date Time Provider Department Center  02/05/2023  1:20 PM GI-315 CT 1 GI-315CT [facility]  05/13/2023 10:30 AM Armbruster, Willaim Rayas, MD [facility]  05/18/2023  2:00 PM [name], MD [facility] None    ------------------------------------------------------------------------------------------------------------------   HPI BP (!) 146/82   Pulse (!) 106   Temp 97.7 F (36.5 C)   SpO2 99%   72 y.o.female presents for evaluation of clitoral itching accompanied by urinary frequency.  She has a hx of HSV 2 and does not current take an antiviral although she has been prescribed Acyclovir in the past.  Continues to endorse low back pain however, this is most often chronic in nature and hard for her to decipher if f r/t urinary symptoms. Denies dysuria or hematuria.  Denies fever, chills, N/V.    Of note she also reports yeast outbreak underneath bilateral axilla and breast folds.  Skin is mildly erythematous and associated with pruritus.   Last blood  work in office showed elevated potassium at 5.5.  She denies any CP, heart palpitations, cramping.  She  has also noticed increase in BP readings.  She was taken off Ziac and since starting [name] injection (monthly) 3 months ago, she feels as though BP has stared to remain elevated.     Past Medical History:  Diagnosis Date   Arthritis    ASCVD (arteriosclerotic cardiovascular disease)    Closed nondisplaced subtrochanteric fracture of left femur (HCC) 09/03/2017   September 2018.  Status post surgery by Dr. Carola Frost   Crohn's disease of ileum without complication (HCC)    DDD (degenerative disc disease), lumbar    Depression    Dyspnea    Eye infection    Fibromyalgia    Gait disorder 10/26/2012   GERD (gastroesophageal reflux disease)    GI bleed    Hyperlipidemia    Hypertension    Hypothyroid    Periprosthetic supracondylar fracture of femur, left  03/23/2017   Pneumonia    Pre-diabetes    Restless leg syndrome    Rhinitis 06/25/2010   Sleep apnea    does not use CPAP   Spinal headache    with the C section   TIA (transient ischemic attack)    3         Vitamin D deficiency      Allergies  Allergen Reactions   Methocarbamol Shortness Of Breath, Nausea Only and Other (See Comments)    Other reaction(s): Dizziness, psychological reaction, disoriented Sleepy and weight loss    Codeine Other (See Comments)    Headache   Statins Other (See Comments)    Extreme pain   Azathioprine    Other Other (See Comments)    MINT=sore mouth and stomach upset   Oxycodone     Dizziness, unbalanced.   Amitriptyline Other (See Comments)    Confusion and hallucinations   Erythromycin Base Rash    Flushing/red face.   Fish Oil Nausea Only   Flax Seed [Flax Seed Oil] Rash   Flaxseed (Linseed) Rash   Hydrocodone Itching and Rash        Morphine Nausea And Vomiting and Rash   Nsaids Nausea Only   Nuvigil [Armodafinil] Other (See Comments)    Unspecified   Sertraline Rash   Sinemet [Carbidopa W-Levodopa] Rash   Sulfa Antibiotics Rash   Tolmetin Nausea Only    Current  Outpatient Medications on File Prior to Visit  Medication Sig   acyclovir (ZOVIRAX) 400 MG tablet Take 1 tablet (400 mg total) by mouth 3 (three) times daily.   benzonatate (TESSALON) 200 MG capsule Take 1 perle up to 3 x/day ONLY if Needed for Severe Cough               ( DX:  r05.9 )                                  /                                                                   TAKE  BY                                                 MOUTH   BIOTIN PO Take by mouth.   Black Pepper-Turmeric (TURMERIC PLUS BLACK PEPPER EXT PO) Take by mouth.   Calcium Carbonate (CALCIUM 600 PO) Take by mouth.   carboxymethylcellulose (REFRESH PLUS) 0.5 % SOLN Place 1 drop into both eyes 3 (three) times daily as needed (dry eyes).   CINNAMON PO Take by mouth.   clobetasol ointment (TEMOVATE) 0.05 % Apply 1 Application topically 3 (three) times daily as needed (lichen planus).   clotrimazole (MYCELEX) 10 MG troche Take 10 mg by mouth 3 (three) times daily.   dexamethasone (DECADRON) 0.5 MG/5ML solution Take 5 mLs (0.5 mg total) by mouth 3 (three) times daily.   diclofenac Sodium (VOLTAREN) 1 % GEL Apply 2 g topically daily as needed (pain).   DULoxetine (CYMBALTA) 60 MG capsule Take 60 mg by mouth daily.   fenofibrate micronized (LOFIBRA) 134 MG capsule TAKE 1 CAPSULE EVERY DAY BEFORE BREAKFAST   Ferrous Sulfate (IRON PO) Take 25 mg by mouth daily.   fluticasone (FLONASE) 50 MCG/ACT nasal spray USE TWO SPRAYS IN EACH NOSTRIL DAILY AS NEEDED (Patient taking differently: Place 2 sprays into both nostrils at bedtime.)   gabapentin (NEURONTIN) 300 MG capsule Take 1 capsule 4 x /day for Chronic Pain         ( Dx: m79.7)                                               /                                TAKE                                   BY                                  MOUTH   levothyroxine (SYNTHROID) 88 MCG tablet Take  1 tablet  Daily  on an empty stomach with only water  for 30 minutes & no Antacid meds, Calcium or Magnesium for 4 hours & avoid Biotin   meclizine (ANTIVERT) 25 MG tablet TAKE 1 TABLET (25 MG TOTAL) BY MOUTH 3 (THREE) TIMES DAILY AS NEEDED FOR DIZZINESS.   mupirocin ointment (BACTROBAN) 2 % Apply 1 Application topically 3 (three) times daily as needed (lichen planus).   neomycin-polymyxin-hydrocortisone (CORTISPORIN) OTIC solution Place 3 drops into both ears daily as needed (ear inflammation).   omeprazole (PRILOSEC) 40 MG capsule TAKE 1 CAPSULE EVERY DAY   OVER THE COUNTER MEDICATION Place 1 drop under the tongue daily. D3 5000 units - K2 120 mcg   polyethylene glycol (MIRALAX / GLYCOLAX) 17 g packet Take 17-34 g by mouth at bedtime.   rOPINIRole (REQUIP) 2 MG tablet Take  1 tablet  2 x / day  &  2 tablets  at Bedtime  for Restless Legs   tiZANidine (ZANAFLEX)  2 MG tablet TAKE 1 TABLET AT BEDTIME FOR MUSCLE SPASM   traMADol (ULTRAM) 50 MG tablet Take 50 mg by mouth every 6 (six) hours as needed for moderate pain or severe pain.   fluconazole (DIFLUCAN) 150 MG tablet Take one tablet at onset of symptoms and second tablet three days later for yeast.   No current facility-administered medications on file prior to visit.    ROS: all negative except what is noted in the HPI.   Physical Exam:  BP (!) 146/82   Pulse (!) 106   Temp 97.7 F (36.5 C)   SpO2 99%   General Appearance: NAD.  Awake, conversant and cooperative. Eyes: PERRLA, EOMs intact.  Sclera white.  Conjunctiva without erythema. Sinuses: No frontal/maxillary tenderness.  No nasal discharge. Nares patent.  ENT/Mouth: Ext aud canals clear.  Bilateral TMs w/DOL and without erythema or bulging. Hearing intact.  Posterior pharynx without swelling or exudate.  Tonsils without swelling or erythema.  Neck: Supple.  No masses, nodules or thyromegaly. Respiratory: Effort is regular with non-labored breathing. Breath sounds are equal bilaterally without rales, rhonchi, wheezing or stridor.   Cardio: RRR with no MRGs. Brisk peripheral pulses without edema.  Abdomen: Active BS in all four quadrants.  Soft and non-tender without guarding, rebound tenderness, hernias or masses. Lymphatics: Non tender without lymphadenopathy.  Musculoskeletal: Full ROM, 5/5 strength, normal ambulation.  No clubbing or cyanosis. Skin: Mildly erythematous skin along bilateral axilla and breast folds.  Appropriate color for ethnicity. Warm without rashes, lesions, ecchymosis, ulcers.  Neuro: CN II-XII grossly normal. Normal muscle tone without cerebellar symptoms and intact sensation.   Psych: AO X 3,  appropriate mood and affect, insight and judgment.     Adela Glimpse, NP 2:06 PM [facility]

## 2023-01-21 NOTE — Patient Instructions (Signed)
Skin Yeast Infection  A skin yeast infection is a condition in which there is an overgrowth of yeast (Candida) that normally lives on the skin. This condition usually occurs in areas of the skin that are constantly warm and moist, such as the skin under the breasts or armpits, or in the groin and other body folds. What are the causes? This condition is caused by a change in the normal balance of the yeast that live on the skin. What increases the risk? You are more likely to develop this condition if you: Are obese. Are pregnant. Are 44 years of age or older. Wear tight clothing. Have any of the following conditions: Diabetes. Malnutrition. A weak body defense system (immune system). Take medicines such as: Birth control pills. Antibiotics. Steroid medicines. What are the signs or symptoms? The most common symptom of this condition is itchiness in the affected area. Other symptoms include: A red, swollen area of the skin. Bumps on the skin. How is this diagnosed? This condition is diagnosed with a medical history and physical exam. Your health care provider may check for yeast by taking scrapings of the skin to be viewed under a microscope. How is this treated? This condition is treated with medicine. Medicines may be prescribed or available over the counter. The medicines may be: Taken by mouth (orally). Applied as a cream or powder to your skin. Follow these instructions at home:  Take or apply over-the-counter and prescription medicines only as told by your health care provider. Maintain a healthy weight. If you need help losing weight, talk with your health care provider. Keep your skin clean and dry. Wear loose-fitting clothing. If you have diabetes, keep your blood sugar under control. Keep all follow-up visits. This is important. Contact a health care provider if: Your symptoms go away and then come back. Your symptoms do not get better with treatment. Your symptoms get  worse. Your rash spreads. You have a fever or chills. You have new symptoms. You have new warmth or redness of your skin. Your rash is painful or bleeding. Summary A skin yeast infection is a condition in which there is an overgrowth of yeast (Candida) that normally lives on the skin. Take or apply over-the-counter and prescription medicines only as told by your health care provider. Keep your skin clean and dry. Contact a health care provider if your symptoms do not get better with treatment. This information is not intended to replace advice given to you by your health care provider. Make sure you discuss any questions you have with your health care provider. Document Revised: 09/04/2020 Document Reviewed: 09/04/2020 Elsevier Patient Education  2024 [employer].

## 2023-01-22 ENCOUNTER — Encounter: Payer: Self-pay | Admitting: Nurse Practitioner

## 2023-01-22 DIAGNOSIS — M48062 Spinal stenosis, lumbar region with neurogenic claudication: Secondary | ICD-10-CM | POA: Diagnosis not present

## 2023-01-22 DIAGNOSIS — R260 Ataxic gait: Secondary | ICD-10-CM | POA: Diagnosis not present

## 2023-01-22 DIAGNOSIS — M79604 Pain in right leg: Secondary | ICD-10-CM | POA: Diagnosis not present

## 2023-01-22 DIAGNOSIS — N3281 Overactive bladder: Secondary | ICD-10-CM

## 2023-01-22 DIAGNOSIS — R350 Frequency of micturition: Secondary | ICD-10-CM

## 2023-01-22 DIAGNOSIS — R300 Dysuria: Secondary | ICD-10-CM

## 2023-01-22 LAB — URINALYSIS, ROUTINE W REFLEX MICROSCOPIC
Bacteria, UA: NONE SEEN /HPF
Bilirubin Urine: NEGATIVE
Hgb urine dipstick: NEGATIVE
Hyaline Cast: NONE SEEN /LPF
Ketones, ur: NEGATIVE
Nitrite: NEGATIVE
RBC / HPF: NONE SEEN /HPF (ref 0–2)
Specific Gravity, Urine: 1.024 (ref 1.001–1.035)
Squamous Epithelial / HPF: NONE SEEN /HPF (ref <=5)
pH: 5.5 (ref 5.0–8.0)

## 2023-01-22 LAB — COMPLETE METABOLIC PANEL WITH GFR
AG Ratio: 1.7 (calc) (ref 1.0–2.5)
ALT: 6 U/L (ref 6–29)
AST: 16 U/L (ref 10–35)
Albumin: 4.5 g/dL (ref 3.6–5.1)
Alkaline phosphatase (APISO): 54 U/L (ref 37–153)
BUN: 16 mg/dL (ref 7–25)
CO2: 31 mmol/L (ref 20–32)
Calcium: 10.2 mg/dL (ref 8.6–10.4)
Chloride: 102 mmol/L (ref 98–110)
Creat: 0.71 mg/dL (ref 0.60–1.00)
Globulin: 2.6 g/dL (calc) (ref 1.9–3.7)
Glucose, Bld: 105 mg/dL — ABNORMAL HIGH (ref 65–99)
Potassium: 4.1 mmol/L (ref 3.5–5.3)
Sodium: 142 mmol/L (ref 135–146)
Total Bilirubin: 0.4 mg/dL (ref 0.2–1.2)
Total Protein: 7.1 g/dL (ref 6.1–8.1)
eGFR: 89 mL/min/{1.73_m2} (ref >=60)

## 2023-01-22 LAB — CBC WITH DIFFERENTIAL/PLATELET
Basophils Absolute: 59 cells/uL (ref 0–200)
Lymphs Abs: 1399 cells/uL (ref 850–3900)
MCH: 30.8 pg (ref 27.0–33.0)
MCHC: 33.6 g/dL (ref 32.0–36.0)
MCV: 91.8 fL (ref 80.0–100.0)
Platelets: 272 10*3/uL (ref 140–400)
Total Lymphocyte: 21.2 %

## 2023-01-22 LAB — MICROSCOPIC MESSAGE

## 2023-01-22 LAB — LIPID PANEL
Cholesterol: 200 mg/dL — ABNORMAL HIGH (ref <200)
HDL: 51 mg/dL (ref >=50)
LDL Cholesterol (Calc): 108 mg/dL (calc) — ABNORMAL HIGH
Total CHOL/HDL Ratio: 3.9 (calc) (ref <5.0)
Triglycerides: 310 mg/dL — ABNORMAL HIGH (ref <150)

## 2023-01-22 LAB — URINE CULTURE
MICRO NUMBER:: 15241911
Result:: NO GROWTH
SPECIMEN QUALITY:: ADEQUATE

## 2023-01-22 NOTE — Telephone Encounter (Signed)
Archie Patten,  Can you address these questions since you just saw her yesterday    - Thanks

## 2023-01-23 DIAGNOSIS — M79641 Pain in right hand: Secondary | ICD-10-CM | POA: Diagnosis not present

## 2023-01-23 DIAGNOSIS — M79642 Pain in left hand: Secondary | ICD-10-CM | POA: Diagnosis not present

## 2023-01-23 DIAGNOSIS — M79644 Pain in right finger(s): Secondary | ICD-10-CM | POA: Diagnosis not present

## 2023-01-23 DIAGNOSIS — M25641 Stiffness of right hand, not elsewhere classified: Secondary | ICD-10-CM | POA: Diagnosis not present

## 2023-02-05 ENCOUNTER — Ambulatory Visit
Admission: RE | Admit: 2023-02-05 | Discharge: 2023-02-05 | Disposition: A | Discharge status: Home / Self Care | Payer: Medicare PPO | Source: Ambulatory Visit | Attending: Orthopaedic Surgery | Admitting: Orthopaedic Surgery

## 2023-02-05 DIAGNOSIS — M19012 Primary osteoarthritis, left shoulder: Secondary | ICD-10-CM | POA: Diagnosis not present

## 2023-02-05 DIAGNOSIS — M25512 Pain in left shoulder: Secondary | ICD-10-CM | POA: Diagnosis not present

## 2023-02-05 DIAGNOSIS — G8929 Other chronic pain: Secondary | ICD-10-CM | POA: Diagnosis not present

## 2023-02-05 DIAGNOSIS — Z96612 Presence of left artificial shoulder joint: Secondary | ICD-10-CM

## 2023-02-06 ENCOUNTER — Ambulatory Visit: Payer: Medicare PPO | Admitting: Podiatry

## 2023-02-06 ENCOUNTER — Encounter: Payer: Self-pay | Admitting: Podiatry

## 2023-02-06 DIAGNOSIS — M7752 Other enthesopathy of left foot: Secondary | ICD-10-CM

## 2023-02-06 DIAGNOSIS — M7751 Other enthesopathy of right foot: Secondary | ICD-10-CM | POA: Diagnosis not present

## 2023-02-06 MED ORDER — TRIAMCINOLONE ACETONIDE 10 MG/ML IJ SUSP
10.0000 mg | Freq: Once | INTRAMUSCULAR | Status: AC
Start: 2023-02-06 — End: 2023-02-06
  Administered 2023-02-06: 10 mg via INTRA_ARTICULAR

## 2023-02-10 NOTE — Progress Notes (Signed)
Subjective:   Patient ID: Crystal [name], female   DOB: 72 y.o.   MRN: [id number]   HPI Patient states still having a lot of pain in forefoot bilateral did have vascular studies done and is taking gabapentin   ROS      Objective:  Physical Exam  Neuro vascular status found to be intact inflammation around the lesser MPJs mostly second bilateral with fluid buildup and digital vascular disease with adequate proximal blood flow     Assessment:  Patient who does have distal vascular disease with proximal good circulatory status with inflammatory capsulitis and probable neuropathy     Plan:  H&P reviewed both conditions.  The only thing I think I can help would maybe be eating inflammatory complex and I did do sterile prep I did inject around the second and third MPJ periarticular 3 mg dexamethasone Kenalog 5 mg Xylocaine advised on reduced activity and cushion shoes reappoint as needed

## 2023-02-16 DIAGNOSIS — M79644 Pain in right finger(s): Secondary | ICD-10-CM | POA: Diagnosis not present

## 2023-02-18 ENCOUNTER — Other Ambulatory Visit: Payer: Self-pay | Admitting: Nurse Practitioner

## 2023-02-19 ENCOUNTER — Encounter (HOSPITAL_BASED_OUTPATIENT_CLINIC_OR_DEPARTMENT_OTHER): Payer: Self-pay | Admitting: Emergency Medicine

## 2023-02-19 ENCOUNTER — Emergency Department (HOSPITAL_BASED_OUTPATIENT_CLINIC_OR_DEPARTMENT_OTHER): Payer: Medicare PPO | Admitting: Radiology

## 2023-02-19 ENCOUNTER — Emergency Department (HOSPITAL_BASED_OUTPATIENT_CLINIC_OR_DEPARTMENT_OTHER)
Admission: EM | Admit: 2023-02-19 | Discharge: 2023-02-19 | Disposition: A | Discharge status: Home / Self Care | Payer: Medicare PPO | Attending: Emergency Medicine | Admitting: Emergency Medicine

## 2023-02-19 ENCOUNTER — Telehealth (HOSPITAL_BASED_OUTPATIENT_CLINIC_OR_DEPARTMENT_OTHER): Payer: Self-pay | Admitting: Pulmonary Disease

## 2023-02-19 ENCOUNTER — Other Ambulatory Visit (HOSPITAL_BASED_OUTPATIENT_CLINIC_OR_DEPARTMENT_OTHER): Payer: Self-pay

## 2023-02-19 ENCOUNTER — Telehealth: Payer: Self-pay | Admitting: Gastroenterology

## 2023-02-19 ENCOUNTER — Other Ambulatory Visit: Payer: Self-pay

## 2023-02-19 DIAGNOSIS — U071 COVID-19: Secondary | ICD-10-CM | POA: Diagnosis not present

## 2023-02-19 DIAGNOSIS — R509 Fever, unspecified: Secondary | ICD-10-CM | POA: Diagnosis not present

## 2023-02-19 DIAGNOSIS — Z79899 Other long term (current) drug therapy: Secondary | ICD-10-CM | POA: Insufficient documentation

## 2023-02-19 DIAGNOSIS — L089 Local infection of the skin and subcutaneous tissue, unspecified: Secondary | ICD-10-CM | POA: Diagnosis not present

## 2023-02-19 DIAGNOSIS — Z96611 Presence of right artificial shoulder joint: Secondary | ICD-10-CM | POA: Diagnosis not present

## 2023-02-19 DIAGNOSIS — R0602 Shortness of breath: Secondary | ICD-10-CM | POA: Diagnosis not present

## 2023-02-19 DIAGNOSIS — Z20822 Contact with and (suspected) exposure to covid-19: Secondary | ICD-10-CM | POA: Diagnosis not present

## 2023-02-19 LAB — RESP PANEL BY RT-PCR (RSV, FLU A&B, COVID)  RVPGX2
Influenza A by PCR: NEGATIVE
Influenza B by PCR: NEGATIVE
Resp Syncytial Virus by PCR: NEGATIVE
SARS Coronavirus 2 by RT PCR: POSITIVE — AB

## 2023-02-19 MED ORDER — DOXYCYCLINE HYCLATE 100 MG PO TABS
100.0000 mg | ORAL_TABLET | Freq: Once | ORAL | Status: AC
  Administered 2023-02-19: 100 mg via ORAL
  Filled 2023-02-19: qty 1

## 2023-02-19 MED ORDER — PAXLOVID (300/100) 20 X 150 MG & 10 X 100MG PO TBPK
3.0000 | ORAL_TABLET | Freq: Two times a day (BID) | ORAL | 0 refills | Status: AC
Start: 2023-02-19 — End: 2023-02-24
  Filled 2023-02-19: qty 30, 5d supply, fill #0

## 2023-02-19 MED ORDER — DEXAMETHASONE 4 MG PO TABS
10.0000 mg | ORAL_TABLET | Freq: Once | ORAL | Status: AC
  Administered 2023-02-19: 10 mg via ORAL
  Filled 2023-02-19: qty 3

## 2023-02-19 MED ORDER — DOXYCYCLINE HYCLATE 100 MG PO CAPS: 100.0000 mg | ORAL_CAPSULE | Freq: Two times a day (BID) | ORAL | 0 refills | Status: DC

## 2023-02-19 MED ORDER — ACETAMINOPHEN 325 MG PO TABS
650.0000 mg | ORAL_TABLET | Freq: Once | ORAL | Status: AC
  Administered 2023-02-19: 650 mg via ORAL
  Filled 2023-02-19: qty 2

## 2023-02-19 MED ORDER — DOXYCYCLINE HYCLATE 100 MG PO CAPS
100.0000 mg | ORAL_CAPSULE | Freq: Two times a day (BID) | ORAL | 0 refills | Status: AC
  Filled 2023-02-19: qty 14, 7d supply, fill #0

## 2023-02-19 NOTE — Telephone Encounter (Signed)
She has COVID infection.  Okay to follow up with her PCP.  This should not have any impact on her using [employer].

## 2023-02-19 NOTE — Telephone Encounter (Signed)
Patient called states she was recently seen at the ED and tested positive for Covid-19. Would like to know when she can start her injection.

## 2023-02-19 NOTE — Telephone Encounter (Signed)
Patient advised. She will take the [facility] 03/04/23.

## 2023-02-19 NOTE — Telephone Encounter (Signed)
The pt has tested positive for Covid and would like to know when she should take [employer].  Please advise

## 2023-02-19 NOTE — ED Triage Notes (Signed)
Pt arrives to ED with c/o fever, body aches, and SOB x4 days. Pt notes husband has COVID.

## 2023-02-19 NOTE — ED Provider Notes (Signed)
[facility] AT [facility] Provider Note   CSN: [id number] Arrival date & time: 02/19/23  1002     History  Chief Complaint  Patient presents with   Fever   Shortness of Breath    Crystal [name] is a 72 y.o. female.  Patient here with fever and bodyaches.  Husband has COVID.  Her symptoms have been for about 4 to 5 days.  She is also concern for little bit of redness in between her left and first toe.  She denies any cough sputum production chest pain shortness of breath weakness numbness tingling.  Nothing makes it worse or better.  She has a history of fibromyalgia, high cholesterol.  The history is provided by the patient.       Home Medications Prior to Admission medications   Medication Sig Start Date End Date Taking? Authorizing Provider  nirmatrelvir & ritonavir (PAXLOVID, 300/100,) 20 x 150 MG & 10 x 100MG  TBPK Take 3 tablets by mouth 2 (two) times daily for 5 days. 02/19/23 02/24/23 Yes Edilia Ghuman, DO  acyclovir (ZOVIRAX) 400 MG tablet Take 1 tablet (400 mg total) by mouth daily. 01/21/23   Adela Glimpse, NP  benzonatate (TESSALON) 200 MG capsule Take 1 perle up to 3 x/day ONLY if Needed for Severe Cough               ( DX:  r05.9 )                                  /                                                                   TAKE                                         BY                                                 MOUTH 01/04/23   [name], MD  BIOTIN PO Take by mouth.    [provider]  bisoprolol-hydrochlorothiazide (ZIAC) 10-6.25 MG tablet Take 1 tablet by mouth daily. 01/21/23   Adela Glimpse, NP  Black Pepper-Turmeric (TURMERIC PLUS BLACK PEPPER EXT PO) Take by mouth.    [provider]  Calcium Carbonate (CALCIUM 600 PO) Take by mouth.    [provider]  carboxymethylcellulose (REFRESH PLUS) 0.5 % SOLN Place 1 drop into both eyes 3 (three) times daily as needed (dry eyes).     [provider]  CINNAMON PO Take by mouth.    [provider]  clobetasol ointment (TEMOVATE) 0.05 % Apply 1 Application topically 3 (three) times daily as needed (lichen planus). 02/10/22   [provider]  clotrimazole (MYCELEX) 10 MG troche Take 10 mg by mouth 3 (three) times daily.    [provider]  dexamethasone (DECADRON) 0.5 MG/5ML solution Take 5 mLs (0.5 mg  total) by mouth 3 (three) times daily. 10/17/22   Adela Glimpse, NP  diclofenac Sodium (VOLTAREN) 1 % GEL Apply 2 g topically daily as needed (pain). 10/17/22   Adela Glimpse, NP  doxycycline (VIBRAMYCIN) 100 MG capsule Take 1 capsule (100 mg total) by mouth 2 (two) times daily for 7 days. 02/19/23 02/26/23  Pema Thomure, DO  DULoxetine (CYMBALTA) 60 MG capsule Take 60 mg by mouth daily.    [provider]  fenofibrate micronized (LOFIBRA) 134 MG capsule TAKE 1 CAPSULE EVERY DAY BEFORE BREAKFAST 08/12/22   Raynelle Dick, NP  Ferrous Sulfate (IRON PO) Take 25 mg by mouth daily.    [provider]  fluticasone (FLONASE) 50 MCG/ACT nasal spray USE TWO SPRAYS IN EACH NOSTRIL DAILY AS NEEDED Patient taking differently: Place 2 sprays into both nostrils at bedtime. 07/18/21   Raynelle Dick, NP  gabapentin (NEURONTIN) 300 MG capsule Take 1 capsule 4 x /day for Chronic Pain         ( Dx: m79.7)                                               /                                TAKE                                   BY                                  MOUTH 07/23/22   [name], MD  levothyroxine (SYNTHROID) 88 MCG tablet Take  1 tablet  Daily  on an empty stomach with only water for 30 minutes & no Antacid meds, Calcium or Magnesium for 4 hours & avoid Biotin 07/23/22   Raynelle Dick, NP  meclizine (ANTIVERT) 25 MG tablet TAKE 1 TABLET (25 MG TOTAL) BY MOUTH 3 (THREE) TIMES DAILY AS NEEDED FOR DIZZINESS. 06/13/18   [name], MD  mupirocin ointment (BACTROBAN) 2 % Apply 1  Application topically 3 (three) times daily as needed (lichen planus).    [provider]  neomycin-polymyxin-hydrocortisone (CORTISPORIN) OTIC solution Place 3 drops into both ears daily as needed (ear inflammation).    [provider]  nystatin cream (MYCOSTATIN) Apply 1 Application topically 2 (two) times daily. 01/21/23   Adela Glimpse, NP  omeprazole (PRILOSEC) 40 MG capsule TAKE 1 CAPSULE EVERY DAY 12/18/22   Raynelle Dick, NP  OVER THE COUNTER MEDICATION Place 1 drop under the tongue daily. D3 5000 units - K2 120 mcg    [provider]  polyethylene glycol (MIRALAX / GLYCOLAX) 17 g packet Take 17-34 g by mouth at bedtime.    [provider]  rOPINIRole (REQUIP) 2 MG tablet Take  1 tablet  2 x / day  &  2 tablets  at Bedtime  for Restless Legs 08/05/22   Raynelle Dick, NP  tiZANidine (ZANAFLEX) 2 MG tablet TAKE 1 TABLET AT BEDTIME FOR MUSCLE SPASM 01/15/23   Raynelle Dick, NP  traMADol (ULTRAM) 50 MG tablet Take 50 mg by mouth every 6 (  six) hours as needed for moderate pain or severe pain. 11/08/21   [provider]      Allergies    Methocarbamol, Codeine, Statins, Azathioprine, Other, Oxycodone, Amitriptyline, Erythromycin base, Fish oil, Flax seed [flax seed oil], Flaxseed (linseed), Hydrocodone, Morphine, Nsaids, Nuvigil [armodafinil], Sertraline, Sinemet [carbidopa w-levodopa], Sulfa antibiotics, and Tolmetin    Review of Systems   Review of Systems  Physical Exam Updated Vital Signs BP (!) 143/40 (BP Location: Right Arm)   Pulse 95   Temp 98.7 F (37.1 C) (Oral)   Resp 18   SpO2 99%  Physical Exam Vitals and nursing note reviewed.  Constitutional:      General: She is not in acute distress.    Appearance: She is well-developed.  HENT:     Head: Normocephalic and atraumatic.     Mouth/Throat:     Mouth: Mucous membranes are moist.  Eyes:     Extraocular Movements: Extraocular movements intact.     Conjunctiva/sclera:  Conjunctivae normal.     Pupils: Pupils are equal, round, and reactive to light.  Cardiovascular:     Rate and Rhythm: Normal rate and regular rhythm.     Pulses: Normal pulses.          Carotid pulses are  on the right side with bruit and  on the left side with bruit.    Heart sounds: Normal heart sounds. No murmur heard. Pulmonary:     Effort: Pulmonary effort is normal. No respiratory distress.     Breath sounds: Normal breath sounds. No decreased breath sounds or wheezing.  Abdominal:     Palpations: Abdomen is soft.     Tenderness: There is no abdominal tenderness.  Musculoskeletal:        General: No swelling.     Cervical back: Normal range of motion and neck supple.  Skin:    General: Skin is warm and dry.     Capillary Refill: Capillary refill takes less than 2 seconds.     Comments: Chronic small appearing ulcer around the second toe of left, mild redness surrounding   Neurological:     General: No focal deficit present.     Mental Status: She is alert.  Psychiatric:        Mood and Affect: Mood normal.     ED Results / Procedures / Treatments   Labs (all labs ordered are listed, but only abnormal results are displayed) Labs Reviewed  RESP PANEL BY RT-PCR (RSV, FLU A&B, COVID)  [id number] - Abnormal; Notable for the following components:      Result Value   SARS Coronavirus 2 by RT PCR POSITIVE (*)    All other components within normal limits    EKG EKG Interpretation Date/Time:  Thursday February 19 2023 10:12:09 EDT Ventricular Rate:  96 PR Interval:  169 QRS Duration:  86 QT Interval:  344 QTC Calculation: 435 R Axis:   1  Text Interpretation: Sinus rhythm Probable anterior infarct, old Confirmed by Virgina Norfolk [phone]) on 02/19/2023 10:21:25 AM  Radiology DG Chest 2 View  Result Date: 02/19/2023 CLINICAL DATA:  Fever, shortness of breath, and body aches for the past 4 days. COVID exposure. EXAM: CHEST - 2 VIEW COMPARISON:  Chest x-ray dated May 29, 2022. FINDINGS: The heart size and mediastinal contours are within normal limits. Minimally increased interstitial markings at the lung bases. No focal consolidation, pleural effusion, or pneumothorax. No acute osseous abnormality. Unchanged hypoglossal nerve stimulator overlying the right chest wall.  Prior right reverse shoulder arthroplasty. IMPRESSION: 1. Minimally increased interstitial markings at the lung bases could reflect atypical infection given clinical history. Electronically Signed   By: Obie Dredge M.D.   On: 02/19/2023 11:25    Procedures Procedures    Medications Ordered in ED Medications  acetaminophen (TYLENOL) tablet 650 mg (650 mg Oral Given 02/19/23 1118)  doxycycline (VIBRA-TABS) tablet 100 mg (100 mg Oral Given 02/19/23 1118)  dexamethasone (DECADRON) tablet 10 mg (10 mg Oral Given 02/19/23 1118)    ED Course/ Medical Decision Making/ A&P                                 Medical Decision Making Amount and/or Complexity of Data Reviewed Radiology: ordered.  Risk OTC drugs. Prescription drug management.   Crystal [name] is here with viral symptoms.  History of Crohn's, high cholesterol.  She is also concerned about some ulcer in between her first and second digit on her left toe.  She is neurovascular neuromuscular intact.  She has may be a mild cellulitis but more of a chronic ulcer.  In the left second toe.  I think this is from pressure from her big toe.  She uses silicon gel to help with this.  Overall will put her on antibiotics to help prevent infection here/treat a mild cellulitis.  Will refer her to podiatry.  Otherwise it sounds like she has viral symptoms.  I have low suspicion for pneumonia, I suspect COVID or flu or other viral process.  Will get chest x-ray, COVID and flu testing.  She is ready has symptoms for about 4 to 5 days and she is outside the window for antiviral treatment.  Per my review and interpretation of x-ray I do not see any  obvious pneumonia pneumothorax.  Her COVID test is positive.  Overall she is given a dose of Decadron and Tylenol.  She is already given doxycycline for foot infection.  On reevaluation she really thinks that her symptoms only started about 2 days ago.  I will prescribe her Paxlovid.  Renal function was unremarkable last month.  She has some hesitation about taking this medicine overall I do not think that is quite necessary for her to take it.  I recommend that she can talk with her primary care doctor whether or not she should fill the medication or not.  Ultimately I think it is okay whether she wants to take it or not.  We talked about this for quite some time.  Sounds like she will talk with her primary care doctor and overall she feels comfortable with this decision.  I will prescribe her doxycycline to help with foot infection.  This would also cover for may be possible atypical infection in her lungs which I think is unlikely at this time.  She is very well-appearing otherwise.  She understands return precautions.  Discharged in good condition.  This chart was dictated using voice recognition software.  Despite best efforts to proofread,  errors can occur which can change the documentation meaning.         Final Clinical Impression(s) / ED Diagnoses Final diagnoses:  COVID-19  Left foot infection    Rx / DC Orders ED Discharge Orders          Ordered    doxycycline (VIBRAMYCIN) 100 MG capsule  2 times daily,   Status:  Discontinued  02/19/23 1106    doxycycline (VIBRAMYCIN) 100 MG capsule  2 times daily        02/19/23 1136    nirmatrelvir & ritonavir (PAXLOVID, 300/100,) 20 x 150 MG & 10 x 100MG  TBPK  2 times daily        02/19/23 1136              [place], DO 02/19/23 1142

## 2023-02-19 NOTE — Telephone Encounter (Signed)
Patient states was just released from [facility] ED and was told to call in regarding her being sick due to having inspire device. Patient has an appointment Monday with her PCP for a hospital follow up.  Documenting as an [place]

## 2023-02-19 NOTE — Discharge Instructions (Addendum)
Take antibiotics for mild foot infection you might have.  Follow-up with podiatry.foot doctor if foot is not healing well.  You tested tested positive for COVID today.  I have treated you with a long-acting steroid called Decadron to help with your congestion symptoms.  I recommend 1000 mg of Tylenol every 6 hours as needed for fever and bodyaches.  I have prescribed you antiviral for this as well.  You can discuss with your primary care doctor whether or not to take this.  Follow-up with your primary care doctor return if symptoms worsen.

## 2023-02-19 NOTE — Telephone Encounter (Signed)
When is she next due for her next dose of [name]? If she was supposed to take it this week I would hold it at least a week and make sure she has recovered. If she is not supposed to take it for a few more weeks, as long as she has recovered would stay on her schedule and proceed

## 2023-02-20 ENCOUNTER — Ambulatory Visit: Payer: Medicare PPO | Admitting: Nurse Practitioner

## 2023-02-20 NOTE — Progress Notes (Deleted)
ER follow up  Assessment and Plan: ER visit follow up for:      All medications were reviewed with patient and family and fully reconciled. All questions answered fully, and patient and family members were encouraged to call the office with any further questions or concerns. Discussed goal to avoid readmission related to this diagnosis.   Over 40 minutes of exam, counseling, chart review, and complex, high/moderate level critical decision making was performed this visit.   Future Appointments  Date Time Provider Department Center  02/23/2023 10:00 AM Raynelle Dick, NP [facility] None  05/13/2023 10:30 AM Armbruster, Willaim Rayas, MD [facility]  05/18/2023  2:00 PM [name], MD [facility] None     HPI 72 y.o.female presents for follow up from ER visit 02/19/23  Crystal [name] is here with viral symptoms.  History of Crohn's, high cholesterol.  She is also concerned about some ulcer in between her first and second digit on her left toe.  She is neurovascular neuromuscular intact.  She has may be a mild cellulitis but more of a chronic ulcer.  In the left second toe.  I think this is from pressure from her big toe.  She uses silicon gel to help with this.  Overall will put her on antibiotics to help prevent infection here/treat a mild cellulitis.  Will refer her to podiatry.  Otherwise it sounds like she has viral symptoms.  I have low suspicion for pneumonia, I suspect COVID or flu or other viral process.  Will get chest x-ray, COVID and flu testing.  She is ready has symptoms for about 4 to 5 days and she is outside the window for antiviral treatment.   Per my review and interpretation of x-ray I do not see any obvious pneumonia pneumothorax.  Her COVID test is positive.  Overall she is given a dose of Decadron and Tylenol.  She is already given doxycycline for foot infection.  On reevaluation she really thinks that her symptoms only started about 2 days ago.  I  will prescribe her Paxlovid.  Renal function was unremarkable last month.  She has some hesitation about taking this medicine overall I do not think that is quite necessary for her to take it.  I recommend that she can talk with her primary care doctor whether or not she should fill the medication or not.  Ultimately I think it is okay whether she wants to take it or not.  We talked about this for quite some time.  Sounds like she will talk with her primary care doctor and overall she feels comfortable with this decision.  I will prescribe her doxycycline to help with foot infection.  This would also cover for may be possible atypical infection in her lungs which I think is unlikely at this time.  She is very well-appearing otherwise.  She understands return precautions.  Discharged in good condition      Images while in the hospital: DG Chest 2 View  Result Date: 02/19/2023 CLINICAL DATA:  Fever, shortness of breath, and body aches for the past 4 days. COVID exposure. EXAM: CHEST - 2 VIEW COMPARISON:  Chest x-ray dated May 29, 2022. FINDINGS: The heart size and mediastinal contours are within normal limits. Minimally increased interstitial markings at the lung bases. No focal consolidation, pleural effusion, or pneumothorax. No acute osseous abnormality. Unchanged hypoglossal nerve stimulator overlying the right chest wall. Prior right reverse shoulder arthroplasty. IMPRESSION: 1. Minimally increased interstitial markings at the lung  bases could reflect atypical infection given clinical history. Electronically Signed   By: Obie Dredge M.D.   On: 02/19/2023 11:25     Current Outpatient Medications (Endocrine & Metabolic):    dexamethasone (DECADRON) 0.5 MG/5ML solution, Take 5 mLs (0.5 mg total) by mouth 3 (three) times daily.   levothyroxine (SYNTHROID) 88 MCG tablet, Take  1 tablet  Daily  on an empty stomach with only water for 30 minutes & no Antacid meds, Calcium or Magnesium for 4 hours  & avoid Biotin  Current Outpatient Medications (Cardiovascular):    bisoprolol-hydrochlorothiazide (ZIAC) 10-6.25 MG tablet, Take 1 tablet by mouth daily.   fenofibrate micronized (LOFIBRA) 134 MG capsule, TAKE 1 CAPSULE EVERY DAY BEFORE BREAKFAST  Current Outpatient Medications (Respiratory):    benzonatate (TESSALON) 200 MG capsule, Take 1 perle up to 3 x/day ONLY if Needed for Severe Cough               ( DX:  r05.9 )                                  /                                                                   TAKE                                         BY                                                 MOUTH   fluticasone (FLONASE) 50 MCG/ACT nasal spray, USE TWO SPRAYS IN EACH NOSTRIL DAILY AS NEEDED (Patient taking differently: Place 2 sprays into both nostrils at bedtime.)  Current Outpatient Medications (Analgesics):    traMADol (ULTRAM) 50 MG tablet, Take 50 mg by mouth every 6 (six) hours as needed for moderate pain or severe pain.  Current Outpatient Medications (Hematological):    Ferrous Sulfate (IRON PO), Take 25 mg by mouth daily.  Current Outpatient Medications (Other):    acyclovir (ZOVIRAX) 400 MG tablet, Take 1 tablet (400 mg total) by mouth daily.   BIOTIN PO, Take by mouth.   Black Pepper-Turmeric (TURMERIC PLUS BLACK PEPPER EXT PO), Take by mouth.   Calcium Carbonate (CALCIUM 600 PO), Take by mouth.   carboxymethylcellulose (REFRESH PLUS) 0.5 % SOLN, Place 1 drop into both eyes 3 (three) times daily as needed (dry eyes).   CINNAMON PO, Take by mouth.   clobetasol ointment (TEMOVATE) 0.05 %, Apply 1 Application topically 3 (three) times daily as needed (lichen planus).   clotrimazole (MYCELEX) 10 MG troche, Take 10 mg by mouth 3 (three) times daily.   diclofenac Sodium (VOLTAREN) 1 % GEL, Apply 2 g topically daily as needed (pain).   doxycycline (VIBRAMYCIN) 100 MG capsule, Take 1 capsule (100 mg total) by mouth 2 (two) times daily for 7 days.   DULoxetine  (CYMBALTA) 60 MG capsule, Take 60  mg by mouth daily.   gabapentin (NEURONTIN) 300 MG capsule, Take 1 capsule 4 x /day for Chronic Pain         ( Dx: m79.7)                                               /                                TAKE                                   BY                                  MOUTH   meclizine (ANTIVERT) 25 MG tablet, TAKE 1 TABLET (25 MG TOTAL) BY MOUTH 3 (THREE) TIMES DAILY AS NEEDED FOR DIZZINESS.   mupirocin ointment (BACTROBAN) 2 %, Apply 1 Application topically 3 (three) times daily as needed (lichen planus).   neomycin-polymyxin-hydrocortisone (CORTISPORIN) OTIC solution, Place 3 drops into both ears daily as needed (ear inflammation).   nirmatrelvir & ritonavir (PAXLOVID, 300/100,) 20 x 150 MG & 10 x 100MG  TBPK, Take 3 tablets by mouth 2 (two) times daily for 5 days.   nystatin cream (MYCOSTATIN), Apply 1 Application topically 2 (two) times daily.   omeprazole (PRILOSEC) 40 MG capsule, TAKE 1 CAPSULE EVERY DAY   OVER THE COUNTER MEDICATION, Place 1 drop under the tongue daily. D3 5000 units - K2 120 mcg   polyethylene glycol (MIRALAX / GLYCOLAX) 17 g packet, Take 17-34 g by mouth at bedtime.   rOPINIRole (REQUIP) 2 MG tablet, Take  1 tablet  2 x / day  &  2 tablets  at Bedtime  for Restless Legs   tiZANidine (ZANAFLEX) 2 MG tablet, TAKE 1 TABLET AT BEDTIME FOR MUSCLE SPASM  Past Medical History:  Diagnosis Date   Arthritis    ASCVD (arteriosclerotic cardiovascular disease)    Closed nondisplaced subtrochanteric fracture of left femur (HCC) 09/03/2017   September 2018.  Status post surgery by Dr. Carola Frost   Crohn's disease of ileum without complication (HCC)    DDD (degenerative disc disease), lumbar    Depression    Dyspnea    Eye infection    Fibromyalgia    Gait disorder 10/26/2012   GERD (gastroesophageal reflux disease)    GI bleed    Hyperlipidemia    Hypertension    Hypothyroid    Periprosthetic supracondylar fracture of femur, left   03/23/2017   Pneumonia    Pre-diabetes    Restless leg syndrome    Rhinitis 06/25/2010   Sleep apnea    does not use CPAP   Spinal headache    with the C section   TIA (transient ischemic attack)    3         Vitamin D deficiency      Allergies  Allergen Reactions   Methocarbamol Shortness Of Breath, Nausea Only and Other (See Comments)    Other reaction(s): Dizziness, psychological reaction, disoriented Sleepy and weight loss    Codeine Other (See Comments)    Headache   Statins Other (See Comments)  Extreme pain   Azathioprine    Other Other (See Comments)    MINT=sore mouth and stomach upset   Oxycodone     Dizziness, unbalanced.   Amitriptyline Other (See Comments)    Confusion and hallucinations   Erythromycin Base Rash    Flushing/red face.   Fish Oil Nausea Only   Flax Seed [Flax Seed Oil] Rash   Flaxseed (Linseed) Rash   Hydrocodone Itching and Rash        Morphine Nausea And Vomiting and Rash   Nsaids Nausea Only   Nuvigil [Armodafinil] Other (See Comments)    Unspecified   Sertraline Rash   Sinemet [Carbidopa W-Levodopa] Rash   Sulfa Antibiotics Rash   Tolmetin Nausea Only    ROS: all negative except above.   Physical Exam: There were no vitals filed for this visit. There were no vitals taken for this visit. General Appearance: Well nourished, in no apparent distress. Eyes: PERRLA, EOMs, conjunctiva no swelling or erythema Sinuses: No Frontal/maxillary tenderness ENT/Mouth: Ext aud canals clear, TMs without erythema, bulging. No erythema, swelling, or exudate on post pharynx.  Tonsils not swollen or erythematous. Hearing normal.  Neck: Supple, thyroid normal.  Respiratory: Respiratory effort normal, BS equal bilaterally without rales, rhonchi, wheezing or stridor.  Cardio: RRR with no MRGs. Brisk peripheral pulses without edema.  Abdomen: Soft, + BS.  Non tender, no guarding, rebound, hernias, masses. Lymphatics: Non tender without  lymphadenopathy.  Musculoskeletal: Full ROM, 5/5 strength, normal gait.  Skin: Warm, dry without rashes, lesions, ecchymosis.  Neuro: Cranial nerves intact. Normal muscle tone, no cerebellar symptoms. Sensation intact.  Psych: Awake and oriented X 3, normal affect, Insight and Judgment appropriate.     Raynelle Dick, NP 10:39 AM [name]

## 2023-02-22 NOTE — Progress Notes (Unsigned)
C  A  N  C  E  L  L  E  D     WENT    BACK    TO    ER                                                                                                                                                                                                                                                                                                               Future Appointments  Date Time Provider Department  02/23/2023 11:45 AM [name], MD [facility]  05/13/2023 10:30 AM Armbruster, Willaim Rayas, MD [facility]  05/18/2023  2:00 PM [name], MD [facility]   History of Present Illness:     This very nice 72 y.o. [name] with  HTN, HLD, diet controlled T2_DM, Hypothyroidism, OSA/BiPAP (hx/o mask intolerance) and Vitamin D Deficiency presents   Patient was seen in th ER 4 days ago on 8/22 & dx'd with Covid with sx's  of fevers & myalgias presenting 5 days previous ( now 9 days from presentation. In the ER her CXR was negative she/her/hers was given a Rx for Paxlovid & also a Rx for Doxycycline for possible toe infection.     Current Outpatient Medications on File Prior to Visit  Medication Sig   acyclovir (ZOVIRAX) 400 MG tablet Take 1 tablet (400 mg total) by mouth daily.   benzonatate (TESSALON) 200 MG capsule Take 1 perle up to 3 x/day ONLY if Needed for Severe Cough    BIOTIN  Take    bisoprolol-hctz 10-6.25 MG  Take 1 tablet bdaily.   Black Pepper-Turmeric  Take    CALCIUM 600 Take    REFRESH PLUS  SOLN Place 1 drop into both eyes 3 times daily as needed    CINNAMON  Take   clobetasol ointment 0.05 % Apply 1 Application topically 3 (three) times daily as needed (lichen planus).    clotrimazole (MYCELEX) 10 MG troche Take 10 mg by mouth 3  times daily.  dexamethasone (DECADRON) 0.5 MG/5ML solution Take 5 mLs (0.5 mg total) 3 (three) times daily.   diclofenac Sodium (VOLTAREN) 1 % GEL Apply 2 g topically daily as needed (pain).   doxycycline (VIBRAMYCIN) 100 MG capsule Take 1 capsule (100 mg total) by mouth 2 (two) times daily for 7 days.   DULoxetine (CYMBALTA) 60 MG capsule Take 60 mg by mouth daily.   fenofibrate micronized (LOFIBRA) 134 MG capsule TAKE 1 CAPSULE EVERY DAY BEFORE BREAKFAST   Ferrous Sulfate (IRON PO) Take 25 mg by mouth daily.   fluticasone (FLONASE) 50 MCG/ACT nasal spray USE TWO SPRAYS IN EACH NOSTRIL DAILY AS NEEDED (Patient taking differently: Place 2 sprays into both nostrils at bedtime.)   gabapentin 300 MG capsule Take 1 capsule 4 x /day for Chronic Pain                levothyroxine 88 MCG tablet Take  1 tablet  Daily     meclizine  25 MG tablet TAKE 1 TABLET  3  TIMES DAILY AS NEEDED    mupirocin ointment 2 % Apply  3 times daily as needed (lichen planus).   CORTISPORIN)OTIC solution Place 3 drops into both ears daily as needed    nirmatrelvir & ritonavir (PAXLOVID, 300/100,) 20 x 150 MG & 10 x 100MG  TBPK Take 3 tablets by mouth 2 (two) times daily for 5 days.   nystatin cream (MYCOSTATIN) Apply 1 Application topically 2 (two) times daily.   omeprazole 40 MG capsule TAKE 1 CAPSULE EVERY DAY   D3 5000 units - K2 120 mcg Place 1 drop under the tongue daily.    MIRALAX  17 g packet Take 17-34 g by mouth at bedtime.   rOPINIRole 2 MG tablet Take  1 tablet  2 x /day & 2 tablets  at Bedtime  for Restless Legs   tiZANidine (ZANAFLEX) 2 MG tablet TAKE 1 TABLET AT BEDTIME FOR MUSCLE SPASM   traMADol (ULTRAM) 50 MG tablet Take 50 mg by mouth every 6 (six) hours as needed for moderate pain or severe pain.     Allergies  Allergen Reactions   Methocarbamol Shortness Of Breath, Nausea Only and Other (See Comments)    Other reaction(s): Dizziness,  psychological reaction, disoriented Sleepy and weight loss    Codeine Other (See Comments)    Headache   Statins Other (See Comments)    Extreme pain   Azathioprine    Other Other (See Comments)    MINT=sore mouth and stomach upset   Oxycodone     Dizziness, unbalanced.   Amitriptyline Other (See Comments)    Confusion and hallucinations   Erythromycin Base Rash    Flushing/red face.   Fish Oil Nausea Only   Flax Seed [Flax Seed Oil] Rash   Flaxseed (Linseed) Rash   Hydrocodone Itching and Rash        Morphine Nausea And Vomiting and Rash   Nsaids Nausea Only   Nuvigil [Armodafinil] Other (See Comments)    Unspecified   Sertraline Rash   Sinemet [Carbidopa W-Levodopa] Rash   Sulfa Antibiotics Rash   Tolmetin Nausea Only     Problem list She has Obstructive sleep apnea; RESTLESS LEG SYNDROME; Essential hypertension; Rhinitis; GERD; Crohn's Enterocolitis / Regional enteritis; Fibromyalgia; Gait disorder; Hyperlipidemia associated with type 2 diabetes mellitus (HCC); Hypothyroid; Depression, major, recurrent, in partial remission (HCC); Vitamin D deficiency; ASCVD; Medication management; Rotator cuff arthropathy; Obesity (BMI 30.0-34.9); Spinal stenosis in cervical region; IBS (irritable colon syndrome); Dyspnea on  exertion; Sleep talking; B12 deficiency anemia; Polypharmacy; Bronchiectasis without complication (HCC); Arthritis of carpometacarpal (CMC) joints of both thumbs; Bilateral bunions; Cervical disc disease; Crohn's ileitis (HCC); Rotator cuff tear; Diverticulosis; History of adenomatous polyp of colon; Internal hemorrhoids; Knee pain; Other specified arthropathy involving shoulder region; S/P revision of total knee, left; Diet-controlled type 2 diabetes mellitus (HCC); Rupture of popliteal cyst of right knee region (complex cyst); History of recurrent TIAs; COVID-19 virus infection; CKD stage 2 due to type 2 diabetes mellitus (HCC); History of total knee arthroplasty, right;  Pain due to total right knee replacement (HCC); Presence of artificial knee joint, right; Aortic atherosclerosis (HCC) by Chest CT scan on 09/2017; Lumbar spinal stenosis; and Dizziness on their problem list.   Observations/Objective:  There were no vitals taken for this visit.  HEENT - WNL. Neck - supple.  Chest - Clear equal BS. Cor - Nl HS. RRR w/o sig MGR. PP 1(+). No edema. MS- FROM w/o deformities.  Gait Nl. Neuro -  Nl w/o focal abnormalities.   Assessment and Plan:      Follow Up Instructions:        I discussed the assessment and treatment plan with the patient. The patient was provided an opportunity to ask questions and all were answered. The patient agreed with the plan and demonstrated an understanding of the instructions.       The patient was advised to call back or seek an in-person evaluation if the symptoms worsen or if the condition fails to improve as anticipated.    Marinus Maw, MD

## 2023-02-22 NOTE — Patient Instructions (Signed)
  PLEASE   STOP    BIOTIN    - usual amount of Biotin iin a Multivitamin is about 30 mcg ( & is OK) , but   Unfortunately it's frequently sold OTC alone or in Skin, hair, nail products in    amounts of 1,000 mcg  -  2,500 mcg to 5,000 mcg & I've even seen it at 10,000 mcg     - The problem being  is that Biotin causes the lab analyzer machines    that measure the thyroid hormone  (& other blood measurements) to    yield incorrect results  & if the Medical provider is unaware of this,    then incorrect changes in medicines ( thyroid & other ) are recommended based on    incorrect results & there have been reports of Death due to patients taking Biotin.    But since it's not a regulated substance, the FDA does nothing !   - Taking Biotin will make it look like the Thyroid is too low in the blood & then    If the Doctor prescribes thyroid, then there would be too much in the blood &    could cause a heart arrhythmia or severe hypertension  leading to a stroke or heart attack

## 2023-02-23 ENCOUNTER — Emergency Department (HOSPITAL_BASED_OUTPATIENT_CLINIC_OR_DEPARTMENT_OTHER): Payer: Medicare PPO

## 2023-02-23 ENCOUNTER — Other Ambulatory Visit (HOSPITAL_BASED_OUTPATIENT_CLINIC_OR_DEPARTMENT_OTHER): Payer: Self-pay

## 2023-02-23 ENCOUNTER — Encounter (HOSPITAL_BASED_OUTPATIENT_CLINIC_OR_DEPARTMENT_OTHER): Payer: Self-pay

## 2023-02-23 ENCOUNTER — Ambulatory Visit: Payer: Medicare PPO | Admitting: Nurse Practitioner

## 2023-02-23 ENCOUNTER — Other Ambulatory Visit: Payer: Self-pay

## 2023-02-23 ENCOUNTER — Emergency Department (HOSPITAL_BASED_OUTPATIENT_CLINIC_OR_DEPARTMENT_OTHER): Payer: Medicare PPO | Admitting: Radiology

## 2023-02-23 ENCOUNTER — Ambulatory Visit: Payer: Medicare PPO | Admitting: Internal Medicine

## 2023-02-23 ENCOUNTER — Encounter: Payer: Self-pay | Admitting: Internal Medicine

## 2023-02-23 ENCOUNTER — Emergency Department (HOSPITAL_BASED_OUTPATIENT_CLINIC_OR_DEPARTMENT_OTHER)
Admission: EM | Admit: 2023-02-23 | Discharge: 2023-02-23 | Disposition: A | Discharge status: Home / Self Care | Payer: Medicare PPO | Source: Home / Self Care | Attending: Emergency Medicine | Admitting: Emergency Medicine

## 2023-02-23 DIAGNOSIS — M4802 Spinal stenosis, cervical region: Secondary | ICD-10-CM | POA: Diagnosis not present

## 2023-02-23 DIAGNOSIS — R519 Headache, unspecified: Secondary | ICD-10-CM | POA: Insufficient documentation

## 2023-02-23 DIAGNOSIS — S92812A Other fracture of left foot, initial encounter for closed fracture: Secondary | ICD-10-CM | POA: Diagnosis not present

## 2023-02-23 DIAGNOSIS — E039 Hypothyroidism, unspecified: Secondary | ICD-10-CM | POA: Insufficient documentation

## 2023-02-23 DIAGNOSIS — Z79899 Other long term (current) drug therapy: Secondary | ICD-10-CM | POA: Diagnosis not present

## 2023-02-23 DIAGNOSIS — Z8616 Personal history of COVID-19: Secondary | ICD-10-CM | POA: Diagnosis not present

## 2023-02-23 DIAGNOSIS — S0990XA Unspecified injury of head, initial encounter: Secondary | ICD-10-CM | POA: Diagnosis not present

## 2023-02-23 DIAGNOSIS — S92512A Displaced fracture of proximal phalanx of left lesser toe(s), initial encounter for closed fracture: Secondary | ICD-10-CM | POA: Insufficient documentation

## 2023-02-23 DIAGNOSIS — R059 Cough, unspecified: Secondary | ICD-10-CM | POA: Insufficient documentation

## 2023-02-23 DIAGNOSIS — W19XXXA Unspecified fall, initial encounter: Secondary | ICD-10-CM

## 2023-02-23 DIAGNOSIS — Z981 Arthrodesis status: Secondary | ICD-10-CM | POA: Diagnosis not present

## 2023-02-23 DIAGNOSIS — S92902A Unspecified fracture of left foot, initial encounter for closed fracture: Secondary | ICD-10-CM | POA: Diagnosis not present

## 2023-02-23 DIAGNOSIS — W01198A Fall on same level from slipping, tripping and stumbling with subsequent striking against other object, initial encounter: Secondary | ICD-10-CM | POA: Insufficient documentation

## 2023-02-23 DIAGNOSIS — S92502A Displaced unspecified fracture of left lesser toe(s), initial encounter for closed fracture: Secondary | ICD-10-CM

## 2023-02-23 DIAGNOSIS — Z96611 Presence of right artificial shoulder joint: Secondary | ICD-10-CM | POA: Diagnosis not present

## 2023-02-23 DIAGNOSIS — M47811 Spondylosis without myelopathy or radiculopathy, occipito-atlanto-axial region: Secondary | ICD-10-CM | POA: Diagnosis not present

## 2023-02-23 DIAGNOSIS — S99922A Unspecified injury of left foot, initial encounter: Secondary | ICD-10-CM | POA: Diagnosis present

## 2023-02-23 DIAGNOSIS — M47812 Spondylosis without myelopathy or radiculopathy, cervical region: Secondary | ICD-10-CM | POA: Diagnosis not present

## 2023-02-23 MED ORDER — AMOXICILLIN 500 MG PO CAPS
500.0000 mg | ORAL_CAPSULE | Freq: Three times a day (TID) | ORAL | 0 refills | Status: AC
  Filled 2023-02-23: qty 15, 5d supply, fill #0

## 2023-02-23 MED ORDER — AMOXICILLIN 500 MG PO CAPS: 500.0000 mg | ORAL_CAPSULE | Freq: Three times a day (TID) | ORAL | 0 refills | Status: DC

## 2023-02-23 NOTE — ED Triage Notes (Signed)
Pt arrives with c/o fall on Saturday hitting her head, also thinks she broke her toe states it is swollen. Denies LOC, denies blood thinners. Pt COVID + here on Thursday. Also some nausea.

## 2023-02-23 NOTE — ED Provider Notes (Signed)
[place] EMERGENCY DEPARTMENT AT [facility] Provider Note   CSN: [id number] Arrival date & time: 02/23/23  1144     History  Chief Complaint  Patient presents with   Crystal [name] Crystal [name] is a 72 y.o. female.   Fall   72 year old female presents emergency department after mechanical fall on Saturday.  States that she was walking in the kitchen carrying an object and tripped on a nearby box falling and landing on her left side.  Reports trauma to the left side of her face.  Denies loss of consciousness, blood thinner use.  States that she was able to get up by herself and has been performing ADLs as a baseline.  Presents emergency department due to continued left-sided headache.  Patient states that she was recently diagnosed with COVID on Thursday of this week and has had persistent cough but states that symptoms seem to be improving in general.  Denies any recent fever, chills, chest pain, shortness of breath, abdominal pain, nausea, vomit, urinary symptoms, change in bowel habits.  Patient also states that she struck one of the steps while walking upstairs and developed some bruising, swelling and pain to her second and third digits on her left foot.  States that incident occurred on Friday.  Denies any visual disturbance or baseline, slurred speech, facial droop from baseline, gait abnormalities, weakness had sensory deficits in upper or lower extremities.  Past medical history significant for fibromyalgia, hyperlipidemia, hypertension, hypothyroidism, TIA, OSA, Crohn's,  Home Medications Prior to Admission medications   Medication Sig Start Date End Date Taking? Authorizing Provider  acyclovir (ZOVIRAX) 400 MG tablet Take 1 tablet (400 mg total) by mouth daily. 01/21/23   Adela Glimpse, NP  amoxicillin (AMOXIL) 500 MG capsule Take 1 capsule (500 mg total) by mouth 3 (three) times daily for 5 days. 02/23/23 02/28/23  Peter Garter, PA  benzonatate  (TESSALON) 200 MG capsule Take 1 perle up to 3 x/day ONLY if Needed for Severe Cough               ( DX:  r05.9 )                                  /                                                                   TAKE                                         BY                                                 MOUTH 01/04/23   [name], MD  BIOTIN PO Take by mouth.    [provider]  bisoprolol-hydrochlorothiazide (ZIAC) 10-6.25 MG tablet Take 1 tablet by mouth daily. 01/21/23   Adela Glimpse, NP  Black Pepper-Turmeric (TURMERIC PLUS BLACK PEPPER EXT PO)  Take by mouth.    [provider]  Calcium Carbonate (CALCIUM 600 PO) Take by mouth.    [provider]  carboxymethylcellulose (REFRESH PLUS) 0.5 % SOLN Place 1 drop into both eyes 3 (three) times daily as needed (dry eyes).    [provider]  CINNAMON PO Take by mouth.    [provider]  clobetasol ointment (TEMOVATE) 0.05 % Apply 1 Application topically 3 (three) times daily as needed (lichen planus). 02/10/22   [provider]  clotrimazole (MYCELEX) 10 MG troche Take 10 mg by mouth 3 (three) times daily.    [provider]  dexamethasone (DECADRON) 0.5 MG/5ML solution Take 5 mLs (0.5 mg total) by mouth 3 (three) times daily. 10/17/22   Adela Glimpse, NP  diclofenac Sodium (VOLTAREN) 1 % GEL Apply 2 g topically daily as needed (pain). 10/17/22   Adela Glimpse, NP  doxycycline (VIBRAMYCIN) 100 MG capsule Take 1 capsule (100 mg total) by mouth 2 (two) times daily for 7 days. 02/19/23 02/26/23  Curatolo, Adam, DO  DULoxetine (CYMBALTA) 60 MG capsule Take 60 mg by mouth daily.    [provider]  fenofibrate micronized (LOFIBRA) 134 MG capsule TAKE 1 CAPSULE EVERY DAY BEFORE BREAKFAST 08/12/22   Raynelle Dick, NP  Ferrous Sulfate (IRON PO) Take 25 mg by mouth daily.    [provider]  fluticasone (FLONASE) 50 MCG/ACT nasal spray USE TWO SPRAYS IN EACH NOSTRIL  DAILY AS NEEDED Patient taking differently: Place 2 sprays into both nostrils at bedtime. 07/18/21   Raynelle Dick, NP  gabapentin (NEURONTIN) 300 MG capsule Take 1 capsule 4 x /day for Chronic Pain         ( Dx: m79.7)                                               /                                TAKE                                   BY                                  MOUTH 07/23/22   [name], MD  levothyroxine (SYNTHROID) 88 MCG tablet Take  1 tablet  Daily  on an empty stomach with only water for 30 minutes & no Antacid meds, Calcium or Magnesium for 4 hours & avoid Biotin 07/23/22   Raynelle Dick, NP  meclizine (ANTIVERT) 25 MG tablet TAKE 1 TABLET (25 MG TOTAL) BY MOUTH 3 (THREE) TIMES DAILY AS NEEDED FOR DIZZINESS. 06/13/18   [name], MD  mupirocin ointment (BACTROBAN) 2 % Apply 1 Application topically 3 (three) times daily as needed (lichen planus).    [provider]  neomycin-polymyxin-hydrocortisone (CORTISPORIN) OTIC solution Place 3 drops into both ears daily as needed (ear inflammation).    [provider]  nirmatrelvir & ritonavir (PAXLOVID, 300/100,) 20 x 150 MG & 10 x 100MG  TBPK Take 3 tablets by mouth 2 (two) times daily for 5 days. 02/19/23 02/24/23  Virgina Norfolk,  DO  nystatin cream (MYCOSTATIN) Apply 1 Application topically 2 (two) times daily. 01/21/23   Adela Glimpse, NP  omeprazole (PRILOSEC) 40 MG capsule TAKE 1 CAPSULE EVERY DAY 12/18/22   Raynelle Dick, NP  OVER THE COUNTER MEDICATION Place 1 drop under the tongue daily. D3 5000 units - K2 120 mcg    [provider]  polyethylene glycol (MIRALAX / GLYCOLAX) 17 g packet Take 17-34 g by mouth at bedtime.    [provider]  rOPINIRole (REQUIP) 2 MG tablet Take  1 tablet  2 x / day  &  2 tablets  at Bedtime  for Restless Legs 08/05/22   Raynelle Dick, NP  tiZANidine (ZANAFLEX) 2 MG tablet TAKE 1 TABLET AT BEDTIME FOR MUSCLE SPASM 01/15/23   Raynelle Dick, NP   traMADol (ULTRAM) 50 MG tablet Take 50 mg by mouth every 6 (six) hours as needed for moderate pain or severe pain. 11/08/21   [provider]      Allergies    Methocarbamol, Codeine, Statins, Azathioprine, Other, Oxycodone, Amitriptyline, Erythromycin base, Fish oil, Flax seed [flax seed oil], Flaxseed (linseed), Hydrocodone, Morphine, Nsaids, Nuvigil [armodafinil], Sertraline, Sinemet [carbidopa w-levodopa], Sulfa antibiotics, and Tolmetin    Review of Systems   Review of Systems  All other systems reviewed and are negative.   Physical Exam Updated Vital Signs BP (!) 149/90 (BP Location: Left Arm)   Pulse 81   Temp 98.6 F (37 C) (Oral)   Resp 17   Ht 5\' 2"  (1.575 m)   Wt 67.1 kg   SpO2 99%   BMI 27.07 kg/m  Physical Exam Vitals and nursing note reviewed.  Constitutional:      General: She is not in acute distress.    Appearance: She is well-developed.  HENT:     Head: Normocephalic.     Comments: Some swelling to left temporal region.  Overlying tenderness to palpation. Eyes:     Conjunctiva/sclera: Conjunctivae normal.  Cardiovascular:     Rate and Rhythm: Normal rate and regular rhythm.     Heart sounds: No murmur heard. Pulmonary:     Effort: Pulmonary effort is normal. No respiratory distress.     Breath sounds: Normal breath sounds.  Abdominal:     Palpations: Abdomen is soft.     Tenderness: There is no abdominal tenderness.  Musculoskeletal:        General: No swelling.     Cervical back: Neck supple.     Comments: No midline tenderness of cervical, thoracic, lumbar spine with no obvious step-off, oriented.  No chest wall tenderness to palpation.  Patient with full range of motion bilateral hips, knees, ankles, digits, shoulders, elbows, wrist, digits.  Ecchymosis appreciated to left second and third digits as well as in the pad approximately affected digits.  Overlying tenderness to palpation.  Skin:    General: Skin is warm and dry.     Capillary  Refill: Capillary refill takes less than 2 seconds.  Neurological:     Mental Status: She is alert.     Comments: Alert and oriented to self, place, time and event.   Speech is fluent, clear without dysarthria or dysphasia.   Strength 5/5 in upper/lower extremities   Sensation intact in upper/lower extremities   Normal gait.  CN I not tested  CN II not tested CN III, IV, VI PERRLA and EOMs intact bilaterally  CN V Intact sensation to sharp and light touch to the face  CN VII facial movements asymmetric.  Patient with right-sided facial droop with involvement of right-sided forehead.  Patient states she has a history of Bell's palsy and this is baseline. CN VIII not tested  CN IX, X no uvula deviation, symmetric rise of soft palate  CN XI 5/5 SCM and trapezius strength bilaterally  CN XII Midline tongue protrusion, symmetric L/R movements   Psychiatric:        Mood and Affect: Mood normal.     ED Results / Procedures / Treatments   Labs (all labs ordered are listed, but only abnormal results are displayed) Labs Reviewed - No data to display  EKG None  Radiology DG Foot Complete Left  Result Date: 02/23/2023 CLINICAL DATA:  Left foot pain after stubbing on the stairs. EXAM: LEFT FOOT - COMPLETE 3+ VIEW COMPARISON:  Left foot radiographs 10/31/2022 FINDINGS: There is diffuse decreased bone mineralization. There is a new acute oblique fracture within the midshaft of the proximal phalanx of third toe with up to 2 mm lateral displacement of the distal fracture component with respect to the proximal fracture component. Redemonstration of dorsal approach plate and screw arthrodesis of the great toe metatarsophalangeal joint. Likely remote great toe metatarsal osteotomy. Arthrodesis screw is also seen within the second PIP joint, unchanged. Likely old healed fracture of the proximal phalanx of fifth toe, unchanged. Mild-to-moderate plantar calcaneal heel spur. Mild dorsal midfoot  degenerative osteophytosis. No acute fracture or dislocation. IMPRESSION: Acute oblique fracture within the midshaft of the proximal phalanx of third toe with up to 2 mm lateral displacement of the distal fracture component with respect to the proximal fracture component. Electronically Signed   By: Neita Garnet M.D.   On: 02/23/2023 14:19   DG Chest 2 View  Result Date: 02/23/2023 CLINICAL DATA:  Cough.  Diagnosed with COVID 4 days ago. EXAM: CHEST - 2 VIEW COMPARISON:  Chest radiographs 02/19/2023 in 05/29/2022 FINDINGS: Right chest wall generator with lead extending along the anterior right neck, again likely hypoglossal nerve stimulator. The lungs are clear. No pleural effusion or pneumothorax. Moderate multilevel degenerative disc changes of the mid upper thoracic spine. Partial visualization of reverse total right shoulder arthroplasty, 5 level ACDF plate and screw hardware, and lumbar spine posterior screw hardware. IMPRESSION: No acute cardiopulmonary process. Electronically Signed   By: Neita Garnet M.D.   On: 02/23/2023 14:16   CT Head Wo Contrast  Result Date: 02/23/2023 CLINICAL DATA:  Provided history: Head trauma, GCS=15, scalp hematoma. Neck trauma. Additional history provided: Fall (with head trauma). Nausea. EXAM: CT HEAD WITHOUT CONTRAST CT CERVICAL SPINE WITHOUT CONTRAST TECHNIQUE: Multidetector CT imaging of the head and cervical spine was performed following the standard protocol without intravenous contrast. Multiplanar CT image reconstructions of the cervical spine were also generated. RADIATION DOSE REDUCTION: This exam was performed according to the departmental dose-optimization program which includes automated exposure control, adjustment of the mA and/or kV according to patient size and/or use of iterative reconstruction technique. COMPARISON:  Cervical spine CT 12/19/2019. Cervical spine MRI 07/10/2020. FINDINGS: CT HEAD FINDINGS Brain: Mild generalized cerebral atrophy.  Chronic lacunar infarct within the left thalamus. Small hypodensity within the inferior right basal ganglia, likely reflecting a prominent perivascular space. There is no acute intracranial hemorrhage. No demarcated cortical infarct. No extra-axial fluid collection. No evidence of an intracranial mass. No midline shift. Vascular: No hyperdense vessel.  Atherosclerotic calcifications. Skull: No calvarial fracture or aggressive osseous lesion. Sinuses/Orbits: No mass or acute finding within the imaged orbits.  Small fluid level, and mild background mucosal thickening, within the left maxillary sinus at the imaged levels. Trace fluid level versus minimal mucosal thickening within the left sphenoid sinus. Minimal mucosal thickening within left ethmoid air cells. CT CERVICAL SPINE FINDINGS Alignment: Straightening of the expected cervical lordosis. 2 mm C7-T1 grade 1 anterolisthesis. Skull base and vertebrae: The basion-dental and atlanto-dental intervals are maintained.No evidence of acute fracture to the cervical spine. Congenital C2-C3 vertebral non-segmentation. Prior C3-C7 ACDF with solid appearing arthrodesis Soft tissues and spinal canal: No prevertebral fluid or swelling. No visible canal hematoma. New mineralization posterior to the C1 posterior arch, likely reflecting progressive entesopathy. Disc levels: Congenital C2-C3 vertebral non-segmentation. Prior C3-C7 ACDF. Disc space narrowing at C7-T1 (moderate), T1-T2 (advanced) and T2-T3 (moderate-to-advanced). Multilevel uncovertebral hypertrophy and facet arthrosis. No appreciable high-grade spinal canal stenosis. Multilevel bony neural foraminal narrowing. Degenerative changes also present at the C1-C2 articulation. Upper chest: No consolidation within the imaged lung apices. No visible pneumothorax. Mild prominence of the interstitial lung markings at the imaged levels, suspicious for interstitial edema. Other: Neural stimulator device with generator pack in  the right chest wall and lead extending superiorly to the right neck. IMPRESSION: CT head: 1.  No evidence of an acute intracranial abnormality. 2. Chronic lacunar infarct within the left thalamus. 3. Mild generalized cerebral atrophy. 4. Paranasal sinus disease at the imaged levels as described. CT cervical spine: 1. No evidence of an acute cervical spine fracture. 2. 2 mm C7-T1 grade 1 anterolisthesis. 3. Cervical spondylosis and postoperative changes as described. 4. Congenital C2-C3 vertebral non-segmentation. 5. Mild prominence of the interstitial lung markings at the imaged levels, suspicious for interstitial edema. Electronically Signed   By: Jackey Loge D.O.   On: 02/23/2023 14:10   CT Cervical Spine Wo Contrast  Result Date: 02/23/2023 CLINICAL DATA:  Provided history: Head trauma, GCS=15, scalp hematoma. Neck trauma. Additional history provided: Fall (with head trauma). Nausea. EXAM: CT HEAD WITHOUT CONTRAST CT CERVICAL SPINE WITHOUT CONTRAST TECHNIQUE: Multidetector CT imaging of the head and cervical spine was performed following the standard protocol without intravenous contrast. Multiplanar CT image reconstructions of the cervical spine were also generated. RADIATION DOSE REDUCTION: This exam was performed according to the departmental dose-optimization program which includes automated exposure control, adjustment of the mA and/or kV according to patient size and/or use of iterative reconstruction technique. COMPARISON:  Cervical spine CT 12/19/2019. Cervical spine MRI 07/10/2020. FINDINGS: CT HEAD FINDINGS Brain: Mild generalized cerebral atrophy. Chronic lacunar infarct within the left thalamus. Small hypodensity within the inferior right basal ganglia, likely reflecting a prominent perivascular space. There is no acute intracranial hemorrhage. No demarcated cortical infarct. No extra-axial fluid collection. No evidence of an intracranial mass. No midline shift. Vascular: No hyperdense vessel.   Atherosclerotic calcifications. Skull: No calvarial fracture or aggressive osseous lesion. Sinuses/Orbits: No mass or acute finding within the imaged orbits. Small fluid level, and mild background mucosal thickening, within the left maxillary sinus at the imaged levels. Trace fluid level versus minimal mucosal thickening within the left sphenoid sinus. Minimal mucosal thickening within left ethmoid air cells. CT CERVICAL SPINE FINDINGS Alignment: Straightening of the expected cervical lordosis. 2 mm C7-T1 grade 1 anterolisthesis. Skull base and vertebrae: The basion-dental and atlanto-dental intervals are maintained.No evidence of acute fracture to the cervical spine. Congenital C2-C3 vertebral non-segmentation. Prior C3-C7 ACDF with solid appearing arthrodesis Soft tissues and spinal canal: No prevertebral fluid or swelling. No visible canal hematoma. New mineralization posterior to the C1 posterior arch,  likely reflecting progressive entesopathy. Disc levels: Congenital C2-C3 vertebral non-segmentation. Prior C3-C7 ACDF. Disc space narrowing at C7-T1 (moderate), T1-T2 (advanced) and T2-T3 (moderate-to-advanced). Multilevel uncovertebral hypertrophy and facet arthrosis. No appreciable high-grade spinal canal stenosis. Multilevel bony neural foraminal narrowing. Degenerative changes also present at the C1-C2 articulation. Upper chest: No consolidation within the imaged lung apices. No visible pneumothorax. Mild prominence of the interstitial lung markings at the imaged levels, suspicious for interstitial edema. Other: Neural stimulator device with generator pack in the right chest wall and lead extending superiorly to the right neck. IMPRESSION: CT head: 1.  No evidence of an acute intracranial abnormality. 2. Chronic lacunar infarct within the left thalamus. 3. Mild generalized cerebral atrophy. 4. Paranasal sinus disease at the imaged levels as described. CT cervical spine: 1. No evidence of an acute cervical  spine fracture. 2. 2 mm C7-T1 grade 1 anterolisthesis. 3. Cervical spondylosis and postoperative changes as described. 4. Congenital C2-C3 vertebral non-segmentation. 5. Mild prominence of the interstitial lung markings at the imaged levels, suspicious for interstitial edema. Electronically Signed   By: Jackey Loge D.O.   On: 02/23/2023 14:10    Procedures Procedures    Medications Ordered in ED Medications - No data to display  ED Course/ Medical Decision Making/ A&P                                 Medical Decision Making Amount and/or Complexity of Data Reviewed Radiology: ordered.  Risk Prescription drug management.   This patient presents to the ED for concern of fall, this involves an extensive number of treatment options, and is a complaint that carries with it a high risk of complications and morbidity.  The differential diagnosis includes CVA, fracture, strain/sprain, dislocation, ligamentous/tendinous injury, neurovascular compromise, pneumothorax, pneumonia, solid organ damage   Co morbidities that complicate the patient evaluation  See HPI   Additional history obtained:  Additional history obtained from EMR External records from outside source obtained and reviewed including hospital records   Lab Tests:  N/a   Imaging Studies ordered:  I ordered imaging studies including chest x-ray, CT head/cervical spine, left foot x-ray I independently visualized and interpreted imaging which showed  Left foot x-ray: Acute oblique fracture within midshaft proximal phalanx with lateral displacement. CT head/cervical spine: No acute abnormality.  Chronic lacunar infarct within the left thalamus.  Paranasal sinus disease.  Mild generalized cerebral atrophy.  Congenital C2-C3 vertebral normal segmentation.  2 mm C7-T1 grade 1 anterolisthesis.  Cervical spondylosis. Chest x-ray: No acute cardiopulmonary abnormality. I agree with the radiologist interpretation  Cardiac  Monitoring: / EKG:  The patient was maintained on a cardiac monitor.  I personally viewed and interpreted the cardiac monitored which showed an underlying rhythm of: Sinus rhythm   Consultations Obtained:  N/a   Problem List / ED Course / Critical interventions / Medication management  Fall, toe fracture Reevaluation of the patient showed that the patient stayed the same I have reviewed the patients home medicines and have made adjustments as needed   Social Determinants of Health:  Denies tobacco, licit drug use.   Test / Admission - Considered:  Fall, toe fracture Vitals signs significant for hypertension blood pressure 149/90. Otherwise within normal range and stable throughout visit. Imaging studies significant for: See above 72 year old female presents emergency department after mechanical fall that occurred on Saturday where she hit her head.  Regarding fall, patient reported fall as  mechanical tripping over a nearby box.  No loss consciousness or anticoagulation use.  Patient without any acute neurologic deficit on exam.  Given head trauma and age, CT imaging of patient cervical spine as well as head was performed which were negative for any acute abnormality.  Regarding left foot, suffered trauma on Friday when she struck one of the steps while walking up steps.  X-ray imaging confirmed evidence of phalangeal fracture.  Patient placed in postop shoe with buddy taping performed and recommend follow-up with orthopedics in the outpatient setting.  Regarding persistent cough, patient without clinical evidence of pneumonia via auscultation and without any chest x-ray evidence of pneumonia or other consolidation/infiltration.  Suspect symptoms are likely secondary to prior viral infection.  Recommend follow-up with primary care in the outpatient setting.  Treatment plan discussed at length with patient and she acknowledged understanding was agreeable to said plan.  Patient overall  well-appearing, afebrile in no acute distress. Worrisome signs and symptoms were discussed with the patient, and the patient acknowledged understanding to return to the ED if noticed. Patient was stable upon discharge.          Final Clinical Impression(s) / ED Diagnoses Final diagnoses:  Closed displaced fracture of phalanx of lesser toe of left foot, unspecified phalanx, initial encounter  Cough, unspecified type  Fall, initial encounter    Rx / DC Orders ED Discharge Orders     None         Peter Garter, [place] 02/23/23 Bebe Liter, MD 02/24/23 [phone]

## 2023-02-23 NOTE — Discharge Instructions (Signed)
As discussed, CT imaging of your head and neck was negative for any acute fracture or dislocation or brain bleed.  You did have a fractured third toe on your left foot.  Recommend buddy taping at home as well as wearing postop shoe for protection.  Follow-up with your preferred orthopedic in the outpatient setting for reevaluation.  Recommend rest, ice, elevation, Tylenol for pain.  Please do not hesitate to return to emergency department if there are worrisome signs and symptoms we discussed become apparent.

## 2023-02-25 ENCOUNTER — Other Ambulatory Visit: Payer: Self-pay | Admitting: Nurse Practitioner

## 2023-02-25 ENCOUNTER — Telehealth: Payer: Self-pay | Admitting: Nurse Practitioner

## 2023-02-25 MED ORDER — DEXAMETHASONE 0.5 MG/5ML PO SOLN: 0.5000 mg | Freq: Three times a day (TID) | ORAL | 1 refills | Status: DC

## 2023-02-25 NOTE — Telephone Encounter (Signed)
Pt is requesting refill on dexamethasone. She said it's for her lichen planus?

## 2023-03-04 ENCOUNTER — Encounter: Payer: Self-pay | Admitting: Internal Medicine

## 2023-03-04 NOTE — Progress Notes (Unsigned)
Future Appointments  Date Time Provider Department  03/05/2023 10:30 AM [name], MD [facility]  03/06/2023  9:45 AM Lenn Sink, DPM [facility]  05/13/2023 10:30 AM Armbruster, Willaim Rayas, MD [facility]  05/18/2023                    cpe  2:00 PM [name], MD [facility]    History of Present Illness:      This very nice 72 y.o. [name] with  HTN, HLD, diet controlled T2_DM, Hypothyroidism, OSA/BiPAP (hx/o mask intolerance) and Vitamin D Deficiency presents after recent ER visit for F/U/.    Patient was evaluated in the ER 10 days ago after a fall & recent hx/o Covid and had negative CXR, CT scans of Neck & Head finding no new or acute abnormalities and Left Foot X-rays found a fx of the Left 3rd toe for which she was recommended "buddy taping" and "post-op " shoe.    Current Outpatient Medications on File Prior to Visit  Medication Sig   acyclovir (ZOVIRAX) 400 MG tablet Take 1 tablet  daily.   benzonatate  200 MG capsule Take 1 perle up to 3 x/day ONLY if Needed    BIOTIN  Take    bisoprolol-hctz 10-6.25 MG tablet Take 1 tablet daily.   Black Pepper-Turmeric  Take    CALCIUM 600  Take    REFRESH PLUS 0.5 % SOLN 1 drop into both eyes 3 x  daily as needed    CINNAMON  Take    clobetasol ointment 0.05 % Apply 3 times daily as needed (lichen planus).   clotrimazole (MYCELEX) 10 MG troche Take  3 (three) times daily.   dexamethasone (DECADRON) 0.5 MG/5ML solution Take 5 mLs (0.5 mg total) 3 ( times daily.   diclofenac  1 % GEL Apply 2 g topically daily as needed (pain).   DULoxetine  60 MG capsule Take 60 mg by mouth daily.   fenofibrate  134 MG capsule TAKE 1 CAPSULE EVERY DAY    Ferrous Sulfate 25 mg  Take daily.   FLONASE nasal spray USE 2 SPRAYS IN EACH NOSTRIL DAILY    gabapentin  300 MG capsule Take 1 capsule 4 x /day for Chronic Pain           levothyroxine (88 MCG tablet Take  1 tablet  Daily    meclizine (ANTIVERT) 25 MG tablet TAKE 1 TAB 3  TIMES DAILY AS NEEDED     mupirocin ointment 2 % Apply  3  times daily as needed (lichen planus).   CORTISPORIN OTIC solution Place 3 drops into both ears daily as needed    nystatin cream  Apply 1 Application topically 2 (two) times daily.   omeprazole 40 MG capsule TAKE 1 CAPSULE EVERY DAY   D3 5000 units - K2 120 mcg Place 1 drop under the tongue daily.    polyethylene glycol Take 17-34 g by mouth at bedtime.   rOPINIRole (2 MG tablet Take  1 tablet  2 x / day  &  2 tablets  at Bedtime     tiZANidine ( 2 MG tablet TAKE 1 TABLET AT BEDTIME   traMADol  50 MG tablet Take every 6 ( hours as needed f     Allergies  Allergen Reactions   Methocarbamol Shortness Of Breath, Nausea Only and Other (See Comments)    Other reaction(s): Dizziness, psychological reaction, disoriented Sleepy and weight loss    Codeine Other (See  Comments)    Headache   Statins Other (See Comments)    Extreme pain   Azathioprine     Other Other (See Comments)    MINT=sore mouth and stomach upset   Oxycodone     Dizziness, unbalanced.   Amitriptyline Other (See Comments)    Confusion and hallucinations   Erythromycin Base Rash    Flushing/red face.   Fish Oil Nausea Only   Flax Seed [Flax Seed Oil] Rash   Flaxseed (Linseed) Rash   Hydrocodone Itching and Rash        Morphine Nausea And Vomiting and Rash   Nsaids Nausea Only   Nuvigil [Armodafinil] Other (See Comments)    Unspecified   Sertraline Rash   Sinemet [Carbidopa W-Levodopa] Rash   Sulfa Antibiotics Rash   Tolmetin Nausea Only     Problem list She has Obstructive sleep apnea; RESTLESS LEG SYNDROME; Essential hypertension; Rhinitis; GERD; Crohn's Enterocolitis / Regional enteritis; Fibromyalgia; Gait disorder; Hyperlipidemia associated with type 2 diabetes mellitus (HCC); Hypothyroid; Depression, major, recurrent, in partial remission (HCC); Vitamin D deficiency; ASCVD; Medication management; Rotator cuff arthropathy; Obesity (BMI 30.0-34.9); Spinal stenosis in  cervical region; IBS (irritable colon syndrome); Dyspnea on exertion; Sleep talking; B12 deficiency anemia; Polypharmacy; Bronchiectasis without complication (HCC); Arthritis of carpometacarpal (CMC) joints of both thumbs; Bilateral bunions; Cervical disc disease; Crohn's ileitis (HCC); Rotator cuff tear; Diverticulosis; History of adenomatous polyp of colon; Internal hemorrhoids; Knee pain; Other specified arthropathy involving shoulder region; S/P revision of total knee, left; Diet-controlled type 2 diabetes mellitus (HCC); Rupture of popliteal cyst of right knee region (complex cyst); History of recurrent TIAs; COVID-19 virus infection; CKD stage 2 due to type 2 diabetes mellitus (HCC); History of total knee arthroplasty, right; Pain due to total right knee replacement (HCC); Presence of artificial knee joint, right; Aortic atherosclerosis (HCC) by Chest CT scan on 09/2017; Lumbar spinal stenosis; and Dizziness on their problem list.   Observations/Objective:  There were no vitals taken for this visit.  HEENT - WNL. Neck - supple.  Chest - Clear equal BS. Cor - Nl HS. RRR w/o sig MGR. PP 1(+). No edema. MS- FROM w/o deformities.  Gait Nl. Neuro -  Nl w/o focal abnormalities.   Assessment and Plan:      Follow Up Instructions:        I discussed the assessment and treatment plan with the patient. The patient was provided an opportunity to ask questions and all were answered. The patient agreed with the plan and demonstrated an understanding of the instructions.       The patient was advised to call back or seek an in-person evaluation if the symptoms worsen or if the condition fails to improve as anticipated.    Marinus Maw, MD

## 2023-03-05 ENCOUNTER — Encounter: Payer: Self-pay | Admitting: Internal Medicine

## 2023-03-05 ENCOUNTER — Ambulatory Visit (INDEPENDENT_AMBULATORY_CARE_PROVIDER_SITE_OTHER): Payer: Medicare PPO | Admitting: Internal Medicine

## 2023-03-05 VITALS — BP 140/74 | HR 68 | Temp 97.7°F | Resp 16 | Ht 63.5 in | Wt 153.2 lb

## 2023-03-05 DIAGNOSIS — M797 Fibromyalgia: Secondary | ICD-10-CM

## 2023-03-05 DIAGNOSIS — U071 COVID-19: Secondary | ICD-10-CM

## 2023-03-05 DIAGNOSIS — J309 Allergic rhinitis, unspecified: Secondary | ICD-10-CM

## 2023-03-05 IMAGING — DX DG HIP (WITH OR WITHOUT PELVIS) 2-3V*R*
3 series · 3 of 3 positions shown · non-contrast
Comparison: March 2020

CLINICAL DATA: Shortness of breath with exertion

EXAM:
DG HIP (WITH OR WITHOUT PELVIS) 2-3V RIGHT

[pelvis ap]
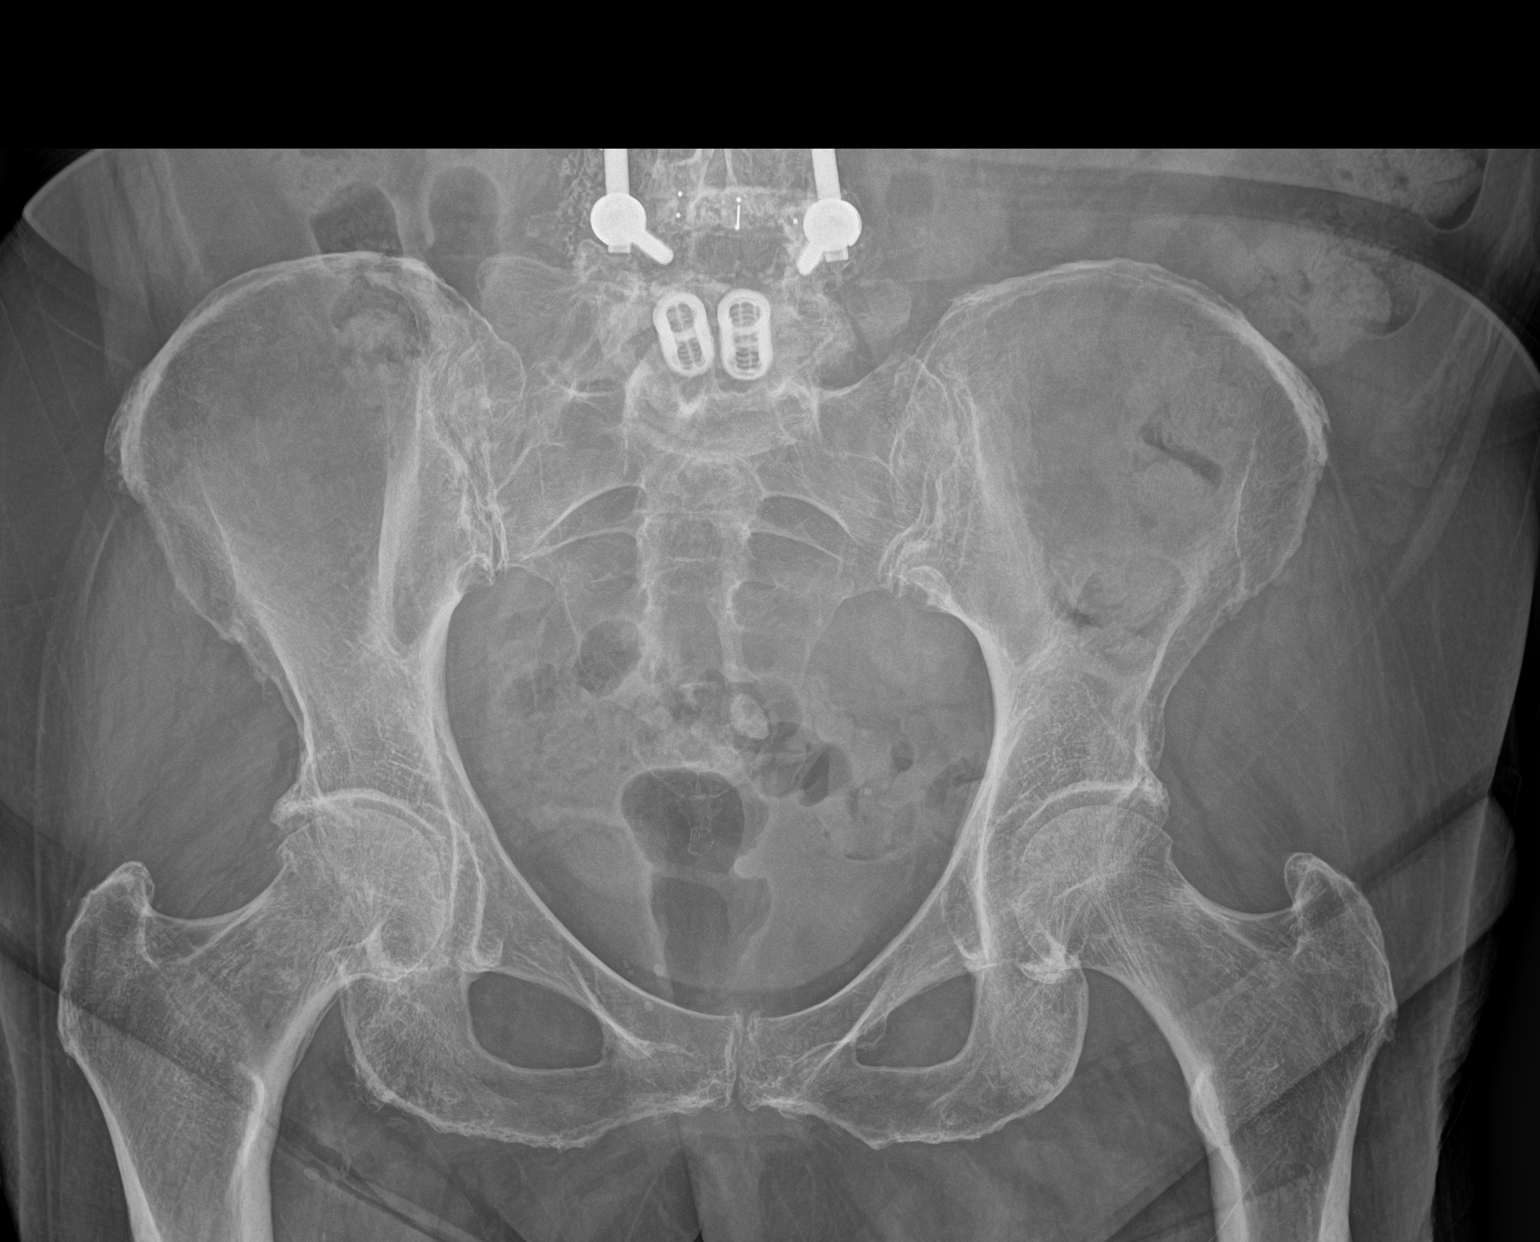

[hip ap]
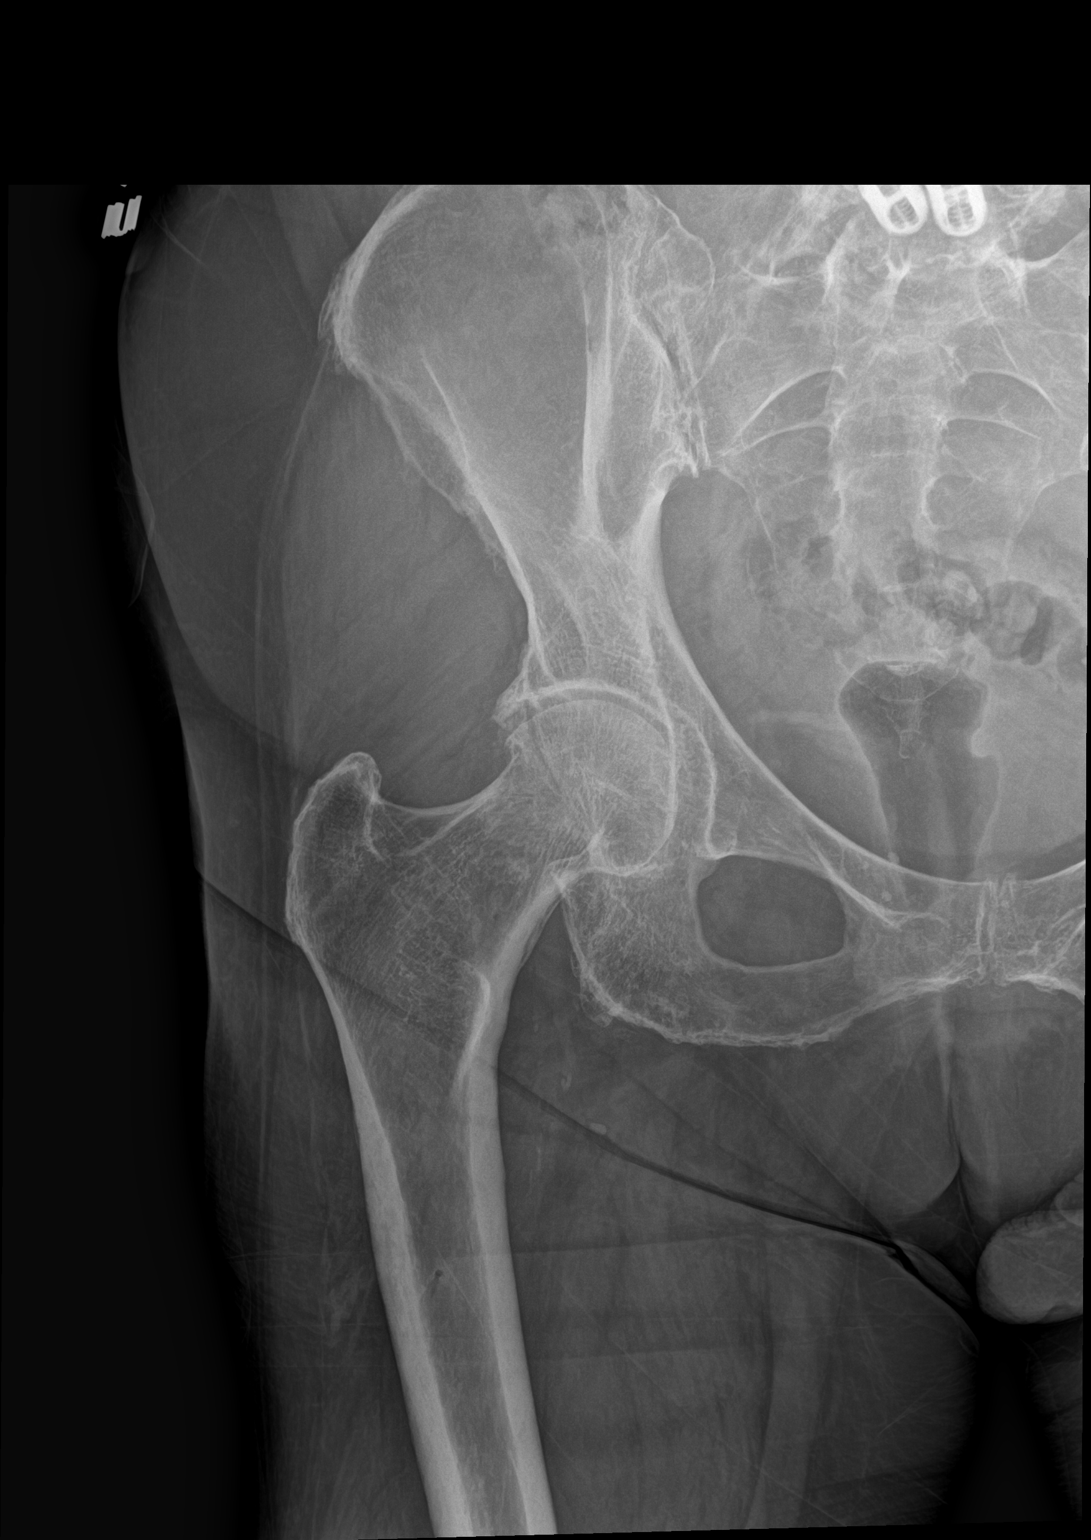

[hip lat]
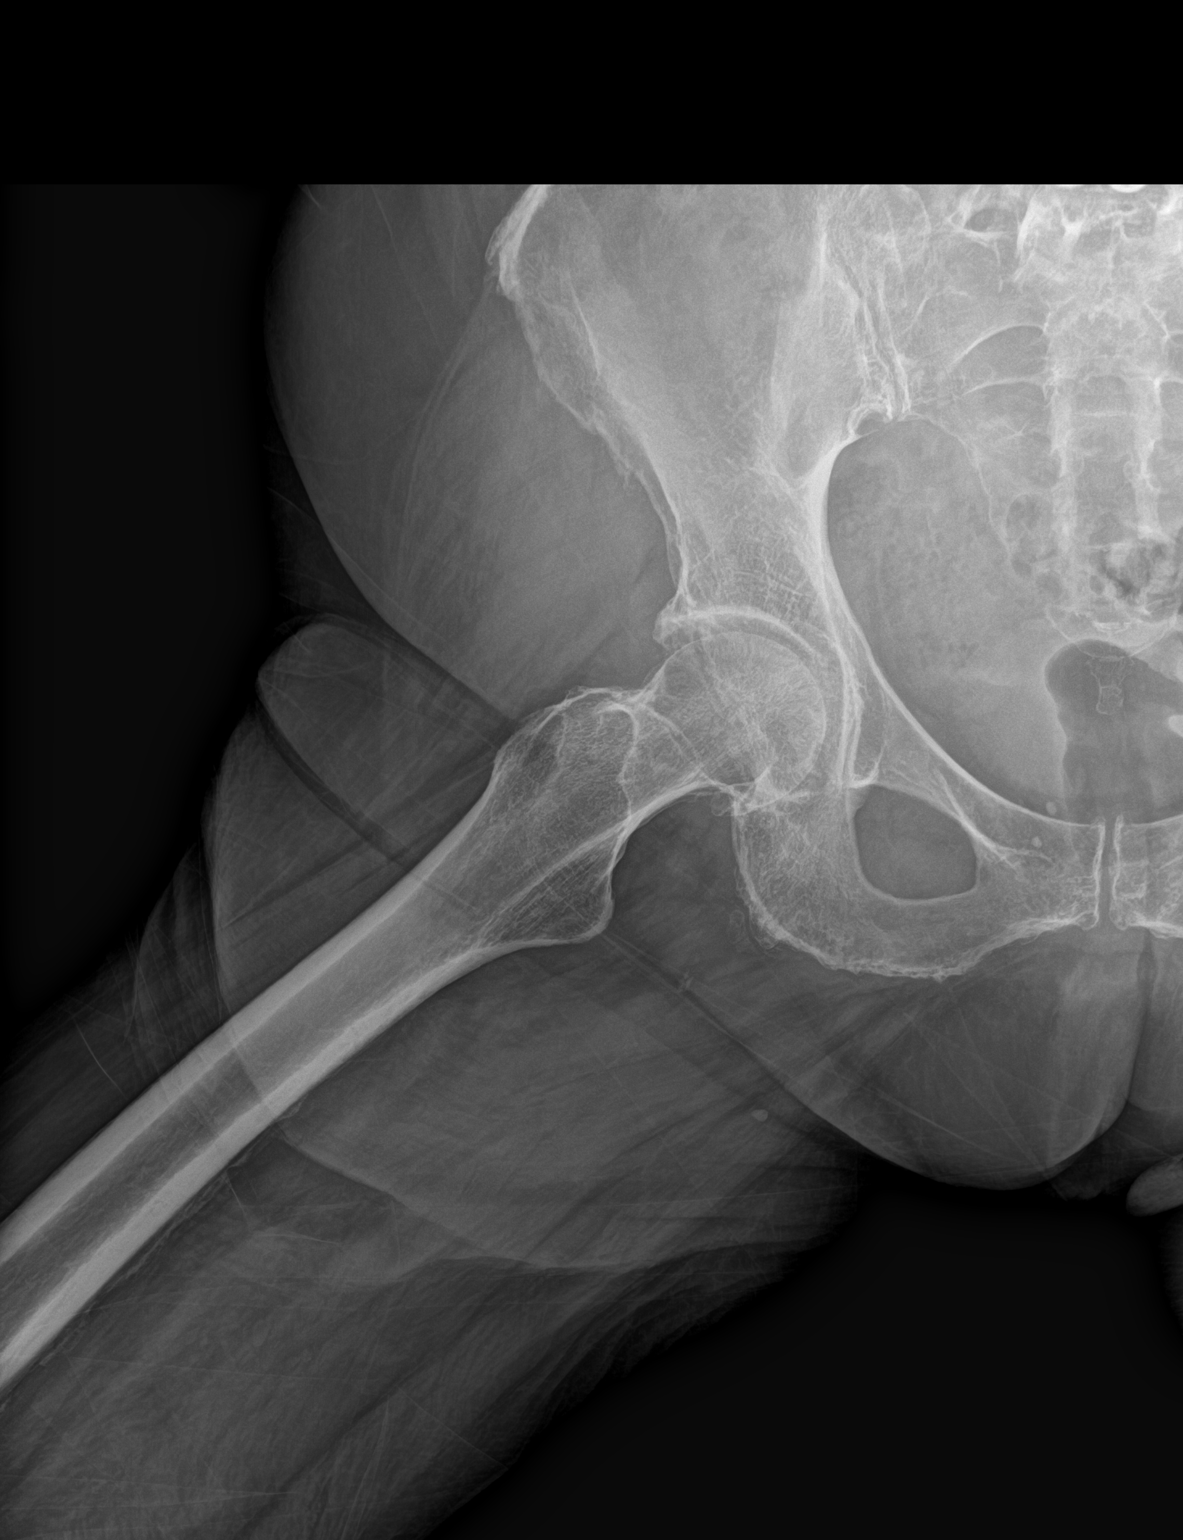

[3 of 3 positions shown; findings below may reference images not displayed]

FINDINGS: Alignment is anatomic. No acute fracture. Degenerative changes are
present at the right hip similar to prior study.
IMPRESSION: Similar appearance of right hip osteoarthritis.

## 2023-03-05 MED ORDER — FLUTICASONE PROPIONATE 50 MCG/ACT NA SUSP
NASAL | Status: DC
Start: 2023-03-05 — End: 2023-05-05

## 2023-03-05 MED ORDER — BUPROPION HCL ER (XL) 300 MG PO TB24: ORAL_TABLET | ORAL | 3 refills | Status: DC

## 2023-03-05 MED ORDER — GABAPENTIN 300 MG PO CAPS
ORAL_CAPSULE | ORAL | Status: AC
Start: 2023-03-05 — End: ?

## 2023-03-05 NOTE — Patient Instructions (Signed)

## 2023-03-06 ENCOUNTER — Encounter: Payer: Self-pay | Admitting: Podiatry

## 2023-03-06 ENCOUNTER — Ambulatory Visit (INDEPENDENT_AMBULATORY_CARE_PROVIDER_SITE_OTHER): Payer: Medicare PPO | Admitting: Podiatry

## 2023-03-06 DIAGNOSIS — M2042 Other hammer toe(s) (acquired), left foot: Secondary | ICD-10-CM

## 2023-03-06 DIAGNOSIS — S92512A Displaced fracture of proximal phalanx of left lesser toe(s), initial encounter for closed fracture: Secondary | ICD-10-CM

## 2023-03-06 DIAGNOSIS — M2041 Other hammer toe(s) (acquired), right foot: Secondary | ICD-10-CM

## 2023-03-08 NOTE — Progress Notes (Signed)
Subjective:   Patient ID: Crystal [name], female   DOB: 72 y.o.   MRN: [id number]   HPI Patient presents stating she think she broke her third toe and so far everything else seems to be doing pretty well    ROS      Objective:  Physical Exam  Neurovascular status was found to be intact with the third digit left found to be swollen within the proximal phalanx and painful when pressed with patient wearing a Band-Aid around the second and third toe.  Good digital perfusion     Assessment:  Possibility for fracture of the third digit left foot     Plan:  H&P reviewed and I went ahead today and have recommended continued buddy taping open toed shoes and that this will probably take 8 to 12 weeks to heal.  I did discuss it is a fairly significant fracture but it still should heal over time with secondary bone intent  X-rays indicate there is a significant fracture of the proximal phalanx digit 3 left with some displacement present

## 2023-03-09 DIAGNOSIS — M48061 Spinal stenosis, lumbar region without neurogenic claudication: Secondary | ICD-10-CM | POA: Diagnosis not present

## 2023-03-09 DIAGNOSIS — Z6826 Body mass index (BMI) 26.0-26.9, adult: Secondary | ICD-10-CM | POA: Diagnosis not present

## 2023-03-10 NOTE — Telephone Encounter (Signed)
Patient called would like to know if she can now have her first shot or not since she is still having some cough with a yellowish phlegm coming out. Please advise.

## 2023-03-10 NOTE — Telephone Encounter (Signed)
Left message on machine to call back  

## 2023-03-11 ENCOUNTER — Ambulatory Visit (INDEPENDENT_AMBULATORY_CARE_PROVIDER_SITE_OTHER): Payer: Medicare PPO | Admitting: Nurse Practitioner

## 2023-03-11 ENCOUNTER — Encounter: Payer: Self-pay | Admitting: Nurse Practitioner

## 2023-03-11 VITALS — BP 130/78 | HR 88 | Temp 97.4°F | Ht 62.0 in | Wt 152.6 lb

## 2023-03-11 DIAGNOSIS — E611 Iron deficiency: Secondary | ICD-10-CM

## 2023-03-11 DIAGNOSIS — Z79899 Other long term (current) drug therapy: Secondary | ICD-10-CM | POA: Diagnosis not present

## 2023-03-11 DIAGNOSIS — R5383 Other fatigue: Secondary | ICD-10-CM

## 2023-03-11 DIAGNOSIS — U099 Post covid-19 condition, unspecified: Secondary | ICD-10-CM

## 2023-03-11 DIAGNOSIS — K5000 Crohn's disease of small intestine without complications: Secondary | ICD-10-CM | POA: Diagnosis not present

## 2023-03-11 DIAGNOSIS — I10 Essential (primary) hypertension: Secondary | ICD-10-CM

## 2023-03-11 MED ORDER — DOXYCYCLINE HYCLATE 100 MG PO TABS: 100.0000 mg | ORAL_TABLET | Freq: Two times a day (BID) | ORAL | 0 refills | Status: AC

## 2023-03-11 NOTE — Telephone Encounter (Signed)
MyChart message sent to patient with recommendations.  

## 2023-03-11 NOTE — Progress Notes (Signed)
Assessment and Plan:  Crystal [name] was seen today for an episodic visit.  Diagnoses and all order for this visit:  Post-COVID syndrome Discussed symptoms may last for months  Other fatigue Discussed possible secondary SE from [employer] injection.  - CBC with Differential/Platelet - COMPLETE METABOLIC PANEL WITH GFR - Iron, TIBC and Ferritin Panel  Essential hypertension Discussed DASH (Dietary Approaches to Stop Hypertension) DASH diet is lower in sodium than a typical American diet. Cut back on foods that are high in saturated fat, cholesterol, and trans fats. Eat more whole-grain foods, fish, poultry, and nuts Remain active and exercise as tolerated daily.  Monitor BP at home-Call if greater than 130/80.  Check CMP/CBC  - CBC with Differential/Platelet - COMPLETE METABOLIC PANEL WITH GFR  Crohn's disease of ileum without complication (HCC) Continue [name] Continue to follow with GI Dr. Adela Lank  - CBC with Differential/Platelet  Medication management All medications discussed and reviewed in full. All questions and concerns regarding medications addressed.    - CBC with Differential/Platelet - COMPLETE METABOLIC PANEL WITH GFR - Iron, TIBC and Ferritin Panel  Low iron Can be associated with fatigue - will check iron levels  - Iron, TIBC and Ferritin Panel  Notify office for further evaluation and treatment, questions or concerns if s/s fail to improve. The risks and benefits of my recommendations, as well as other treatment options were discussed with the patient today. Questions were answered.  Further disposition pending results of labs. Discussed med's effects and SE's.    [name], counseling, chart review, and critical decision making was performed.   Future Appointments  Date Time Provider Department Center  04/02/2023 11:15 AM Lenn Sink, DPM [facility]  05/13/2023 10:30 AM Armbruster, Willaim Rayas, MD [facility]  [facility]  05/18/2023  2:00 PM [name], MD [facility] None  07/14/2023  2:30 PM Glenford Bayley, NP [facility] None    ------------------------------------------------------------------------------------------------------------------   HPI BP 130/78   Pulse 88   Temp (!) 97.4 F (36.3 C)   Ht 5\' 2"  (1.575 m)   Wt 152 lb 9.6 oz (69.2 kg)   SpO2 95%   BMI 27.91 kg/m    Patient complains of symptoms of a URI. Symptoms include congestion, cough described as nonproductive, sinus pressure, and sneezing, fatigue. She has a hx of anemia, feels this may be current d/t incrase in fatigue.  Onset of symptoms was 6 days ago, and has been unchanged since that time. Treatment to date: antihistamines, cough suppressants, and decongestants.  She denies fever, chill, N/V.  She has noticed BP is increasing over time, however, stable today in contributes this to URI symptoms as well as possible SE of starting new medication, [name].   BP Readings from Last 3 Encounters:  03/11/23 130/78  03/05/23 (!) 140/74  02/23/23 (!) 149/90     Past Medical History:  Diagnosis Date   Arthritis    ASCVD (arteriosclerotic cardiovascular disease)    Closed nondisplaced subtrochanteric fracture of left femur (HCC) 09/03/2017   September 2018.  Status post surgery by Dr. Carola Frost   Crohn's disease of ileum without complication [facility])    DDD (degenerative disc disease), lumbar    Depression    Dyspnea    Eye infection    Fibromyalgia    Gait disorder 10/26/2012   GERD (gastroesophageal reflux disease)    GI bleed    Hyperlipidemia    Hypertension    Hypothyroid    Periprosthetic supracondylar fracture of  femur, left  03/23/2017   Pneumonia    Pre-diabetes    Restless leg syndrome    Rhinitis 06/25/2010   Sleep apnea    does not use CPAP   Spinal headache    with the C section   TIA (transient ischemic attack)    3         Vitamin D deficiency      Allergies  Allergen Reactions    Methocarbamol Shortness Of Breath, Nausea Only and Other (See Comments)    Other reaction(s): Dizziness, psychological reaction, disoriented Sleepy and weight loss    Codeine Other (See Comments)    Headache   Statins Other (See Comments)    Extreme pain   Azathioprine    Other Other (See Comments)    MINT=sore mouth and stomach upset   Oxycodone     Dizziness, unbalanced.   Amitriptyline Other (See Comments)    Confusion and hallucinations   Erythromycin Base Rash    Flushing/red face.   Fish Oil Nausea Only   Flax Seed [Flax Seed Oil] Rash   Flaxseed (Linseed) Rash   Hydrocodone Itching and Rash        Morphine Nausea And Vomiting and Rash   Nsaids Nausea Only   Nuvigil [Armodafinil] Other (See Comments)    Unspecified   Sertraline Rash   Sinemet [Carbidopa W-Levodopa] Rash   Sulfa Antibiotics Rash   Tolmetin Nausea Only    Current Outpatient Medications on File Prior to Visit  Medication Sig   acyclovir (ZOVIRAX) 400 MG tablet Take 1 tablet (400 mg total) by mouth daily.   benzonatate (TESSALON) 200 MG capsule Take 1 perle up to 3 x/day ONLY if Needed for Severe Cough               ( DX:  r05.9 )                                  /                                                                   TAKE                                         BY                                                 MOUTH   BIOTIN PO Take by mouth.   bisoprolol-hydrochlorothiazide (ZIAC) 10-6.25 MG tablet Take 1 tablet by mouth daily.   Black Pepper-Turmeric (TURMERIC PLUS BLACK PEPPER EXT PO) Take by mouth.   buPROPion (WELLBUTRIN XL) 300 MG 24 hr tablet Take  1 tablet  Daily  for Mood, Focus & Concentration                                                          /  TAKE                                         BY                                                 MOUTH   carboxymethylcellulose (REFRESH PLUS) 0.5 % SOLN Place 1 drop into  both eyes 3 (three) times daily as needed (dry eyes).   CINNAMON PO Take by mouth.   clobetasol ointment (TEMOVATE) 0.05 % Apply 1 Application topically 3 (three) times daily as needed (lichen planus).   clotrimazole (MYCELEX) 10 MG troche Take 10 mg by mouth 3 (three) times daily.   diclofenac Sodium (VOLTAREN) 1 % GEL Apply 2 g topically daily as needed (pain).   DULoxetine (CYMBALTA) 60 MG capsule Take 60 mg by mouth daily.   fenofibrate micronized (LOFIBRA) 134 MG capsule TAKE 1 CAPSULE EVERY DAY BEFORE BREAKFAST   Ferrous Sulfate (IRON PO) Take 25 mg by mouth daily.   fluticasone (FLONASE) 50 MCG/ACT nasal spray Use  1-2 Sprays  each nostril   1-2 x /day  as needed   gabapentin (NEURONTIN) 300 MG capsule Take  1 capsule  3 x /day  or Chronic Pain    (Dx: m79.7)                                               /                                TAKE                                   BY                                  MOUTH   levothyroxine (SYNTHROID) 88 MCG tablet Take  1 tablet  Daily  on an empty stomach with only water for 30 minutes & no Antacid meds, Calcium or Magnesium for 4 hours & avoid Biotin   meclizine (ANTIVERT) 25 MG tablet TAKE 1 TABLET (25 MG TOTAL) BY MOUTH 3 (THREE) TIMES DAILY AS NEEDED FOR DIZZINESS.   mupirocin ointment (BACTROBAN) 2 % Apply 1 Application topically 3 (three) times daily as needed (lichen planus).   neomycin-polymyxin-hydrocortisone (CORTISPORIN) OTIC solution Place 3 drops into both ears daily as needed (ear inflammation).   nystatin cream (MYCOSTATIN) Apply 1 Application topically 2 (two) times daily.   omeprazole (PRILOSEC) 40 MG capsule TAKE 1 CAPSULE EVERY DAY   OVER THE COUNTER MEDICATION Place 1 drop under the tongue daily. D3 5000 units - K2 120 mcg   polyethylene glycol (MIRALAX / GLYCOLAX) 17 g packet Take 17-34 g by mouth at bedtime.   rOPINIRole (REQUIP) 2 MG tablet Take  1 tablet  2 x / day  &  2 tablets  at Bedtime  for Restless Legs   tiZANidine  (ZANAFLEX) 2 MG  tablet TAKE 1 TABLET AT BEDTIME FOR MUSCLE SPASM   traMADol (ULTRAM) 50 MG tablet Take 50 mg by mouth every 6 (six) hours as needed for moderate pain or severe pain.   No current facility-administered medications on file prior to visit.    ROS: all negative except what is noted in the HPI.   Physical Exam:  BP 130/78   Pulse 88   Temp (!) 97.4 F (36.3 C)   Ht 5\' 2"  (1.575 m)   Wt 152 lb 9.6 oz (69.2 kg)   SpO2 95%   BMI 27.91 kg/m   General Appearance: NAD.  Awake, conversant and cooperative. Eyes: PERRLA, EOMs intact.  Sclera white.  Conjunctiva without erythema. Sinuses: Frontal/maxillary tenderness.  No nasal discharge. Nares patent.  ENT/Mouth: Ext aud canals clear.  Bilateral TMs w/DOL and without erythema or bulging. Hearing intact.  Posterior pharynx without swelling or exudate.  Tonsils without swelling or erythema.  Neck: Supple.  No masses, nodules or thyromegaly. Respiratory: Effort is regular with non-labored breathing. Breath sounds are equal bilaterally without rales, rhonchi, wheezing or stridor.  Cardio: RRR with no MRGs. Brisk peripheral pulses without edema.  Abdomen: Active BS in all four quadrants.  Soft and non-tender without guarding, rebound tenderness, hernias or masses. Lymphatics: Non tender without lymphadenopathy.  Musculoskeletal: Full ROM, 5/5 strength, normal ambulation.  No clubbing or cyanosis. Skin: Appropriate color for ethnicity. Warm without rashes, lesions, ecchymosis, ulcers.  Neuro: CN II-XII grossly normal. Normal muscle tone without cerebellar symptoms and intact sensation.   Psych: AO X 3,  appropriate mood and affect, insight and judgment.     Adela Glimpse, NP 3:18 PM [facility]

## 2023-03-11 NOTE — Telephone Encounter (Signed)
If she is afebrile and mostly resolved this I think okay to proceed with the [employer]. She had COVID 2 weeks ago. If she needs more time to recover could wait another week and see how she feels. Thanks

## 2023-03-12 LAB — CBC WITH DIFFERENTIAL/PLATELET
Absolute Monocytes: 679 {cells}/uL (ref 200–950)
Basophils Absolute: 63 {cells}/uL (ref 0–200)
Basophils Relative: 0.8 %
Eosinophils Absolute: 71 {cells}/uL (ref 15–500)
Eosinophils Relative: 0.9 %
HCT: 47.3 % — ABNORMAL HIGH (ref 35.0–45.0)
Hemoglobin: 15.7 g/dL — ABNORMAL HIGH (ref 11.7–15.5)
Lymphs Abs: 1327 {cells}/uL (ref 850–3900)
MCH: 30.9 pg (ref 27.0–33.0)
MCHC: 33.2 g/dL (ref 32.0–36.0)
MCV: 93.1 fL (ref 80.0–100.0)
MPV: 9.3 fL (ref 7.5–12.5)
Monocytes Relative: 8.6 %
Neutro Abs: 5759 {cells}/uL (ref 1500–7800)
Neutrophils Relative %: 72.9 %
Platelets: 260 10*3/uL (ref 140–400)
RBC: 5.08 10*6/uL (ref 3.80–5.10)
RDW: 12.2 % (ref 11.0–15.0)
Total Lymphocyte: 16.8 %
WBC: 7.9 10*3/uL (ref 3.8–10.8)

## 2023-03-12 LAB — COMPLETE METABOLIC PANEL WITH GFR
AG Ratio: 1.7 (calc) (ref 1.0–2.5)
ALT: 11 U/L (ref 6–29)
AST: 16 U/L (ref 10–35)
Albumin: 4.6 g/dL (ref 3.6–5.1)
Alkaline phosphatase (APISO): 64 U/L (ref 37–153)
BUN/Creatinine Ratio: 18 (calc) (ref 6–22)
BUN: 18 mg/dL (ref 7–25)
CO2: 32 mmol/L (ref 20–32)
Calcium: 10.1 mg/dL (ref 8.6–10.4)
Chloride: 100 mmol/L (ref 98–110)
Creat: 1.02 mg/dL — ABNORMAL HIGH (ref 0.60–1.00)
Globulin: 2.7 g/dL (ref 1.9–3.7)
Glucose, Bld: 104 mg/dL — ABNORMAL HIGH (ref 65–99)
Potassium: 3.9 mmol/L (ref 3.5–5.3)
Sodium: 142 mmol/L (ref 135–146)
Total Bilirubin: 0.6 mg/dL (ref 0.2–1.2)
Total Protein: 7.3 g/dL (ref 6.1–8.1)
eGFR: 58 mL/min/{1.73_m2} — ABNORMAL LOW (ref >=60)

## 2023-03-12 LAB — IRON,TIBC AND FERRITIN PANEL
%SAT: 20 % (ref 16–45)
Ferritin: 72 ng/mL (ref 16–288)
Iron: 73 ug/dL (ref 45–160)
TIBC: 374 ug/dL (ref 250–450)

## 2023-03-12 NOTE — Telephone Encounter (Signed)
Called and spoke with patient regarding recommendations below. Pt stated that she is afebrile, but still has productive cough (green/yellow phlegm). Pt went to her doctor yesterday regarding continued symptoms. Pt will plan to start [name] next week in hopes that she is feeling a little better. Pt is aware that recommendations have been sent to her MyChart as well. Pt verbalized understanding and had no concerns at the end of the call.

## 2023-03-16 DIAGNOSIS — L439 Lichen planus, unspecified: Secondary | ICD-10-CM | POA: Diagnosis not present

## 2023-03-16 DIAGNOSIS — M79643 Pain in unspecified hand: Secondary | ICD-10-CM | POA: Diagnosis not present

## 2023-03-16 DIAGNOSIS — M2550 Pain in unspecified joint: Secondary | ICD-10-CM | POA: Diagnosis not present

## 2023-03-16 DIAGNOSIS — M797 Fibromyalgia: Secondary | ICD-10-CM | POA: Diagnosis not present

## 2023-03-16 DIAGNOSIS — K5090 Crohn's disease, unspecified, without complications: Secondary | ICD-10-CM | POA: Diagnosis not present

## 2023-03-16 DIAGNOSIS — M1990 Unspecified osteoarthritis, unspecified site: Secondary | ICD-10-CM | POA: Diagnosis not present

## 2023-03-16 DIAGNOSIS — M7989 Other specified soft tissue disorders: Secondary | ICD-10-CM | POA: Diagnosis not present

## 2023-03-20 ENCOUNTER — Telehealth: Payer: Self-pay | Admitting: Pulmonary Disease

## 2023-03-20 NOTE — Telephone Encounter (Signed)
Fax received from Dr. Anda Kraft with [facility] to perform a left reverse total shoulder replacement on patient (fax-[phone])  Patient needs surgery clearance. Surgery is pending. Patient was seen on 12/05/22. Office protocol is a risk assessment can be sent to surgeon if patient has been seen in 60 days or less.   Sending to Dr. Craige Cotta for risk assessment or recommendations if patient needs to be seen in office prior to surgical procedure.

## 2023-03-20 NOTE — Telephone Encounter (Signed)
Patient is calling in regards to her surgical clearance. The form has been faxed over from [facility] and is in there surgical clearance box.

## 2023-03-20 NOTE — Telephone Encounter (Signed)
Please schedule appointment with sleep physician or an NP to assess pre-operative respiratory status prior to having surgery.

## 2023-03-22 NOTE — Patient Instructions (Signed)

## 2023-03-23 ENCOUNTER — Ambulatory Visit: Payer: Medicare PPO | Admitting: Nurse Practitioner

## 2023-03-23 NOTE — Telephone Encounter (Signed)
Duplicate

## 2023-03-23 NOTE — Telephone Encounter (Signed)
Appt with Waynetta Sandy was scheduled for 05/04/23

## 2023-03-24 DIAGNOSIS — R350 Frequency of micturition: Secondary | ICD-10-CM | POA: Diagnosis not present

## 2023-03-24 DIAGNOSIS — R3982 Chronic bladder pain: Secondary | ICD-10-CM | POA: Diagnosis not present

## 2023-04-02 ENCOUNTER — Encounter: Payer: Self-pay | Admitting: Podiatry

## 2023-04-02 ENCOUNTER — Ambulatory Visit: Payer: Medicare PPO

## 2023-04-02 ENCOUNTER — Ambulatory Visit: Payer: Medicare PPO | Admitting: Podiatry

## 2023-04-02 DIAGNOSIS — M7751 Other enthesopathy of right foot: Secondary | ICD-10-CM

## 2023-04-02 DIAGNOSIS — S92505D Nondisplaced unspecified fracture of left lesser toe(s), subsequent encounter for fracture with routine healing: Secondary | ICD-10-CM | POA: Diagnosis not present

## 2023-04-02 MED ORDER — TRIAMCINOLONE ACETONIDE 10 MG/ML IJ SUSP
10.0000 mg | Freq: Once | INTRAMUSCULAR | Status: AC
Start: 2023-04-02 — End: 2023-04-02
  Administered 2023-04-02: 10 mg via INTRA_ARTICULAR

## 2023-04-03 NOTE — Progress Notes (Signed)
Subjective:   Patient ID: Crystal [name], female   DOB: 72 y.o.   MRN: [id number]   HPI Patient states overall doing well but developed pain in my right forefoot.  States the left the third toe is still swollen not as much pain and wearing surgical shoe still   ROS      Objective:  Physical Exam  Neurovascular status intact 2 distinct problems with fracture left third digit proximal phalanx and inflammation with fluid buildup around the second MPJ right.  The left foot toe is still swollen moderately tender with palpation     Assessment:  Fracture of the third digit left foot along with inflammatory capsulitis forefoot right     Plan:  H&P x-ray left reviewed still quite a bit healing to go but I am hopeful based on its clinical improvement that it will heal even though eventually could require fixation if it does not and at this point for the right I did sterile prep and I injected periarticular second MPJ 3 mg dexamethasone Kenalog 5 mg Xylocaine  X-ray left indicates that there is still some separation where she fractured this and I am hopeful we will see more signs of healing at next visit with just the beginning occurring now

## 2023-04-06 ENCOUNTER — Encounter: Payer: Self-pay | Admitting: Internal Medicine

## 2023-04-16 ENCOUNTER — Other Ambulatory Visit: Payer: Self-pay | Admitting: Nurse Practitioner

## 2023-04-16 ENCOUNTER — Telehealth: Payer: Self-pay | Admitting: Nurse Practitioner

## 2023-04-16 ENCOUNTER — Other Ambulatory Visit: Payer: Self-pay | Admitting: Internal Medicine

## 2023-04-16 DIAGNOSIS — Z79899 Other long term (current) drug therapy: Secondary | ICD-10-CM

## 2023-04-16 DIAGNOSIS — E039 Hypothyroidism, unspecified: Secondary | ICD-10-CM

## 2023-04-16 DIAGNOSIS — M797 Fibromyalgia: Secondary | ICD-10-CM

## 2023-04-16 MED ORDER — LEVOTHYROXINE SODIUM 88 MCG PO TABS
ORAL_TABLET | ORAL | 3 refills | Status: DC
Start: 2023-04-16 — End: 2023-05-13

## 2023-04-16 MED ORDER — BUPROPION HCL ER (XL) 300 MG PO TB24: ORAL_TABLET | ORAL | 3 refills | Status: DC

## 2023-04-16 MED ORDER — BISOPROLOL-HYDROCHLOROTHIAZIDE 10-6.25 MG PO TABS: 1.0000 | ORAL_TABLET | Freq: Every day | ORAL | 2 refills | Status: DC

## 2023-04-16 NOTE — Telephone Encounter (Signed)
Refill request on [facility] send to [id number] - [place], [place] - [address]

## 2023-04-16 NOTE — Addendum Note (Signed)
Addended by: Dionicio Stall on: 04/16/2023 11:39 AM   Modules accepted: Orders

## 2023-04-23 DIAGNOSIS — L438 Other lichen planus: Secondary | ICD-10-CM | POA: Diagnosis not present

## 2023-04-23 DIAGNOSIS — B370 Candidal stomatitis: Secondary | ICD-10-CM | POA: Diagnosis not present

## 2023-04-23 DIAGNOSIS — L439 Lichen planus, unspecified: Secondary | ICD-10-CM | POA: Diagnosis not present

## 2023-04-27 ENCOUNTER — Encounter: Payer: Self-pay | Admitting: Internal Medicine

## 2023-04-30 ENCOUNTER — Ambulatory Visit (INDEPENDENT_AMBULATORY_CARE_PROVIDER_SITE_OTHER): Payer: Medicare PPO | Admitting: Podiatry

## 2023-04-30 ENCOUNTER — Encounter: Payer: Self-pay | Admitting: Podiatry

## 2023-04-30 VITALS — Ht 62.0 in | Wt 152.6 lb

## 2023-04-30 DIAGNOSIS — M7752 Other enthesopathy of left foot: Secondary | ICD-10-CM

## 2023-04-30 DIAGNOSIS — S92512A Displaced fracture of proximal phalanx of left lesser toe(s), initial encounter for closed fracture: Secondary | ICD-10-CM

## 2023-04-30 DIAGNOSIS — I999 Unspecified disorder of circulatory system: Secondary | ICD-10-CM

## 2023-04-30 DIAGNOSIS — M2042 Other hammer toe(s) (acquired), left foot: Secondary | ICD-10-CM | POA: Diagnosis not present

## 2023-04-30 DIAGNOSIS — M2041 Other hammer toe(s) (acquired), right foot: Secondary | ICD-10-CM

## 2023-04-30 NOTE — Progress Notes (Signed)
Subjective:   Patient ID: Crystal [name], female   DOB: 72 y.o.   MRN: [id number]   HPI Patient presents with painful digital deformities history of fracture of the digit left lesion formation and generalized diminishment of circulatory status   ROS      Objective:  Physical Exam  PT pulses mildly diminished no DP pulses noted with the patient found to have lesion formation digit to bilateral painful and is found to have digital deformities and swelling of the third digit left foot from previous fracture     Assessment:  Chronic conditions with circulatory disease lesions digital deformities fracture     Plan:  H&P reviewed debrided lesions courtesy do not recommend any other treatments but did discuss if symptoms get worse someday would consider digital repair but I would like to do that last and we will go with wider shoe and other treatment and that regard

## 2023-05-04 ENCOUNTER — Ambulatory Visit: Payer: Medicare PPO | Admitting: Primary Care

## 2023-05-05 ENCOUNTER — Telehealth: Payer: Self-pay | Admitting: Gastroenterology

## 2023-05-05 ENCOUNTER — Ambulatory Visit: Payer: Medicare PPO

## 2023-05-05 ENCOUNTER — Ambulatory Visit (INDEPENDENT_AMBULATORY_CARE_PROVIDER_SITE_OTHER): Payer: Medicare PPO | Admitting: Primary Care

## 2023-05-05 ENCOUNTER — Encounter: Payer: Self-pay | Admitting: Primary Care

## 2023-05-05 VITALS — BP 140/88 | HR 75 | Ht 63.0 in | Wt 152.0 lb

## 2023-05-05 DIAGNOSIS — G4733 Obstructive sleep apnea (adult) (pediatric): Secondary | ICD-10-CM | POA: Diagnosis not present

## 2023-05-05 DIAGNOSIS — J309 Allergic rhinitis, unspecified: Secondary | ICD-10-CM | POA: Diagnosis not present

## 2023-05-05 DIAGNOSIS — Z01811 Encounter for preprocedural respiratory examination: Secondary | ICD-10-CM

## 2023-05-05 DIAGNOSIS — J479 Bronchiectasis, uncomplicated: Secondary | ICD-10-CM

## 2023-05-05 DIAGNOSIS — Z01818 Encounter for other preprocedural examination: Secondary | ICD-10-CM | POA: Diagnosis not present

## 2023-05-05 MED ORDER — FLUTICASONE PROPIONATE 50 MCG/ACT NA SUSP
NASAL | 5 refills | Status: AC
Start: 2023-05-05 — End: ?

## 2023-05-05 MED ORDER — MONTELUKAST SODIUM 10 MG PO TABS: 10.0000 mg | ORAL_TABLET | Freq: Every day | ORAL | 5 refills | Status: DC

## 2023-05-05 NOTE — Telephone Encounter (Signed)
Left message (at patient request since she is at another dr office) advising patient that if she does not feel she received the full dosage of [name] from the injector, she should contact [employer] to make them aware of the injector issue and request a new injector. Once received, she may use the full dose on 05/20/23 then every 8 weeks thereafter. Advised to call us back with any additional questions or concerns.

## 2023-05-05 NOTE — Patient Instructions (Addendum)
You will be considered intermediate risk for prolonged mechanical ventilation and/or postop pulmonary complications due to history of OSA, rhinitis and bronchiectasis  Please bring inspire remote with you day of surgery Recommend surgery be done in hospital setting  Recommendations: Use saline rinse 1-2 times a day Continue Flonase nasal Start Montelukast (Singulair) 10mg  at bedtime Take mucinex 600mg  1-2 times a day as needed to loosen congestion  Orders: Pre-op CXR today  Follow-up 6 months with Dr. Wynona Neat (OSA/inspire patient-former Dr. Craige Cotta- 30 mins visit)   Montelukast Chewable Tablets What is this medication? MONTELUKAST (mon te LOO kast) prevents and treats the symptoms of asthma and allergies. It works by decreasing inflammation in the airways, making it easier to breathe. Do not use this medication to treat a sudden asthma attack. This medicine may be used for other purposes; ask your health care provider or pharmacist if you have questions. COMMON BRAND NAME(S): Singulair What should I tell my care team before I take this medication? They need to know if you have any of these conditions: Liver disease Phenylketonuria An unusual or allergic reaction to montelukast, other medications, foods, dyes, or preservatives Pregnant or trying to get pregnant Breastfeeding How should I use this medication? Take this medication by mouth with water. Take it as directed on the prescription label at the same time every day. Chew or crush it completely before swallowing. Do not swallow tablets whole. You can take this medication with or without food. If it upsets your stomach, take it with food. Keep taking it unless your care team tells you to stop. A special MedGuide will be given to you by the pharmacist with each prescription and refill. Be sure to read this information carefully each time. Talk to your care team about the use of this medication in children. While it may be prescribed  for children as young as 2 years for selected conditions, precautions do apply. Overdosage: If you think you have taken too much of this medicine contact a poison control center or emergency room at once. NOTE: This medicine is only for you. Do not share this medicine with others. What if I miss a dose? If you miss a dose, skip it. Take your next dose at the normal time. Do not take extra or 2 doses at the same time to make up for the missed dose. What may interact with this medication? Medications for seizures, such as phenytoin, phenobarbital, carbamazepine Rifabutin Rifampin This list may not describe all possible interactions. Give your health care provider a list of all the medicines, herbs, non-prescription drugs, or dietary supplements you use. Also tell them if you smoke, drink alcohol, or use illegal drugs. Some items may interact with your medicine. What should I watch for while using this medication? Visit your care team for regular checks on your progress. Tell your care team if your allergy or asthma symptoms do not improve. Take your medication even when you do not have symptoms. If you have asthma, talk to your care team about what to do in an acute asthma attack. Always have your inhaled rescue medication for asthma attacks with you. This medication may cause thoughts of suicide or depression. This includes sudden changes in mood, behaviors, or thoughts. Also watch for sudden changes in feelings and behaviors, such as feeling anxious, agitated, panicky, irritable, hostile, aggressive, impulsive, severely restless, overly excited and hyperactive, or not being able to sleep. Call your care team right away if you experience these behaviors or worsening depression.  What side effects may I notice from receiving this medication? Side effects that you should report to your care team as soon as possible: Allergic reactions--skin rash, itching, hives, swelling of the face, lips, tongue, or  throat Flu-like symptoms--fever, chills, muscle pain, cough, headache, fatigue Mood and behavior changes such as anxiety, nervousness, confusion, hallucinations, irritability, hostility, thoughts of suicide or self-harm, worsening mood, feelings of depression Pain, tingling, or numbness in the hands or feet Sinus pain or pressure around the face or forehead Trouble sleeping Vivid dreams or nightmares Side effects that usually do not require medical attention (report to your care team if they continue or are bothersome): Cough Diarrhea Headache Runny or stuffy nose Sore throat Stomach pain This list may not describe all possible side effects. Call your doctor for medical advice about side effects. You may report side effects to FDA at 1-800-FDA-1088. Where should I keep my medication? Keep out of the reach of children and pets. Store between 15 and 30 degrees C (59 and 86 degrees F). Protect from light and moisture. Keep the container tightly closed. Get rid of any unused medication after the expiration date. To get rid of medications that are no longer needed or expired: Take the medication to a medication take-back program. Check with your pharmacy or law enforcement to find a location. If you cannot return the medication, check the label or package insert to see if the medication should be thrown out in the garbage or flushed down the toilet. If you are not sure, ask your care team. If it is safe to put in the trash, empty the medication out of the container. Mix the medication with cat litter, dirt, coffee grounds, or other unwanted substance. Seal the mixture in a bag or container. Put it in the trash. NOTE: This sheet is a summary. It may not cover all possible information. If you have questions about this medicine, talk to your doctor, pharmacist, or health care provider.  2024 Elsevier/Gold Standard (2022-01-13 00:00:00)

## 2023-05-05 NOTE — Telephone Encounter (Signed)
Inbound call from patient stating she took [name] medication on Sunday 11/3. States she was not suppose to take it until 11/20. Patient is requesting to know if she need to order another one and take it on 11/20 or if she should wait 8 weeks. Patient is requesting a call to be advised. Please advise, thank you.

## 2023-05-05 NOTE — Progress Notes (Signed)
@Patient  ID: Crystal [name], female    DOB: Jun 13, 1951, 72 y.o.   MRN: [id number]  Chief Complaint  Patient presents with   Follow-up    Surgical Clearance     Referring provider: [name], MD  HPI: 72 year old female, never smoked.  Past medical history significant for hypertension, aortic arthrosclerosis, OSA, bronchiectasis without complication, allergic rhinitis, Crohn's, GERD, chronic kidney disease stage II, arthropathy involving shoulder region, COVID-19 virus, recurrent TIAs, right total knee arthroplasty, lumbar spine stenosis, obesity.  05/05/2023- interim hx  Former patient of Dr. Craige Cotta.  Followed by our office for obstructive sleep apnea, allergic rhinitis and bronchiectasis without complication. Patient was intolerant to CPAP and has inspire device (hypoglossal nerve stimulator).    Discussed the use of AI scribe software for clinical note transcription with the patient, who gave verbal consent to proceed.  History of Present Illness   Patient presents today for surgical risk assessment for left should surgery revision, tentatively scheduled for 1 January. She has a history of sleep apnea managed with an Inspire device, allergic rhinitis, and bronchiectasis.   She is tolerating inspire well.  She is 80% compliant with usage greater than 4 hours over the last 30 days.  Average pauses per night 0.0.  Current amplitude 2.0 voltage (level 4).  She had COVID-19 in August which has left her feeling fatigued. She also reports increased difficulty breathing, which she attributes to allergies or sensitivities. She has been taking an allergy pill, possibly Allegra, and using Flonase once daily, but reports these measures are not providing sufficient relief.  She reports that she stays "stocked up," suggesting nasal congestion. The patient has a history of receiving allergy shots and has a deviated septum from frequent falls.  The patient's fatigue is not attributed  to sleep apnea, as her Inspire device download shows no pauses in breathing. The patient is on level four of the Inspire device, which is just below the highest level.   The patient is not on oxygen or blood thinners and has no history of blood clots.    Allergies  Allergen Reactions   Methocarbamol Shortness Of Breath, Nausea Only and Other (See Comments)    Other reaction(s): Dizziness, psychological reaction, disoriented Sleepy and weight loss    Codeine Other (See Comments)    Headache   Statins Other (See Comments)    Extreme pain   Azathioprine    Other Other (See Comments)    MINT=sore mouth and stomach upset   Oxycodone     Dizziness, unbalanced.   Amitriptyline Other (See Comments)    Confusion and hallucinations   Erythromycin Base Rash    Flushing/red face.   Fish Oil Nausea Only   Flax Seed [Flax Seed Oil] Rash   Flaxseed (Linseed) Rash   Hydrocodone Itching and Rash        Morphine Nausea And Vomiting and Rash   Nsaids Nausea Only   Nuvigil [Armodafinil] Other (See Comments)    Unspecified   Sertraline Rash   Sinemet [Carbidopa W-Levodopa] Rash   Sulfa Antibiotics Rash   Tolmetin Nausea Only    Immunization History  Administered Date(s) Administered   DT (Pediatric) 01/03/2015   Fluad Quad(high Dose 65+) 04/25/2022, 05/01/2023   Fluad Trivalent(High Dose 65+) 02/19/2018   Influenza Split 03/30/2010, 03/31/2011, 05/14/2014, 02/27/2016, 04/16/2018   Influenza Whole 03/30/2010   Influenza, High Dose Seasonal PF 03/13/2015, 07/21/2019, 02/16/2020, 04/08/2021   Influenza-Unspecified 03/31/2011, 05/14/2014, 02/27/2016, 04/16/2018   PFIZER Comirnaty(Gray Top)Covid-19 Tri-Sucrose Vaccine 09/19/2019,  10/10/2019, 07/13/2020, 05/01/2023   PFIZER(Purple Top)SARS-COV-2 Vaccination 09/19/2019, 10/10/2019   Pfizer Covid-19 Vaccine Bivalent Booster 28yrs & up 05/14/2021   Pneumococcal Conjugate-13 06/30/2005, 04/17/2015, 01/23/2019   Pneumococcal Polysaccharide-23  01/06/2017   Pneumococcal-Unspecified 06/30/2005   Respiratory Syncytial Virus Vaccine,Recomb Aduvanted(Arexvy) 07/01/2022   Td 06/30/2004   Td (Adult),5 Lf Tetanus Toxid, Preservative Free 06/30/2004   Td,absorbed, Preservative Free, Adult Use, Lf Unspecified 06/30/2004   Unspecified SARS-COV-2 Vaccination 04/25/2022   Zoster Recombinant(Shingrix) 05/14/2021, 08/22/2021   Zoster, Live 07/01/2007    Past Medical History:  Diagnosis Date   Arthritis    ASCVD (arteriosclerotic cardiovascular disease)    Closed nondisplaced subtrochanteric fracture of left femur (HCC) 09/03/2017   September 2018.  Status post surgery by Dr. Carola Frost   Crohn's disease of ileum without complication (HCC)    DDD (degenerative disc disease), lumbar    Depression    Dyspnea    Eye infection    Fibromyalgia    Gait disorder 10/26/2012   GERD (gastroesophageal reflux disease)    GI bleed    Hyperlipidemia    Hypertension    Hypothyroid    Periprosthetic supracondylar fracture of femur, left  03/23/2017   Pneumonia    Pre-diabetes    Restless leg syndrome    Rhinitis 06/25/2010   Sleep apnea    does not use CPAP   Spinal headache    with the C section   TIA (transient ischemic attack)    3         Vitamin D deficiency     Tobacco History: Social History   Tobacco Use  Smoking Status Never   Passive exposure: Past  Smokeless Tobacco Never   Counseling given: Not Answered   Outpatient Medications Prior to Visit  Medication Sig Dispense Refill   acyclovir (ZOVIRAX) 400 MG tablet TAKE 1 TABLET BY MOUTH EVERY DAY 90 tablet 1   benzonatate (TESSALON) 200 MG capsule Take 1 perle up to 3 x/day ONLY if Needed for Severe Cough               ( DX:  r05.9 )                                  /                                                                   TAKE                                         BY                                                 MOUTH 30 capsule 0   bisoprolol-hydrochlorothiazide  (ZIAC) 10-6.25 MG tablet Take 1 tablet by mouth daily. 30 tablet 2   Black Pepper-Turmeric (TURMERIC PLUS BLACK PEPPER EXT PO) Take by mouth.     buPROPion (WELLBUTRIN XL) 300 MG 24 hr tablet Take  1 tablet  Daily  for Mood, Focus & Concentration                                                          /                                                                   TAKE                                         BY                                                 MOUTH 90 tablet 3   carboxymethylcellulose (REFRESH PLUS) 0.5 % SOLN Place 1 drop into both eyes 3 (three) times daily as needed (dry eyes).     CINNAMON PO Take by mouth.     clobetasol ointment (TEMOVATE) 0.05 % Apply 1 Application topically 3 (three) times daily as needed (lichen planus).     diclofenac Sodium (VOLTAREN) 1 % GEL Apply 2 g topically daily as needed (pain). 4 g 1   DULoxetine (CYMBALTA) 60 MG capsule TAKE 1 CAPSULE DAILY FOR CHRONIC PAIN 90 capsule 3   fenofibrate micronized (LOFIBRA) 134 MG capsule TAKE 1 CAPSULE EVERY DAY BEFORE BREAKFAST 90 capsule 3   Ferrous Sulfate (IRON PO) Take 25 mg by mouth daily.     fluticasone (FLONASE) 50 MCG/ACT nasal spray Use  1-2 Sprays  each nostril   1-2 x /day  as needed     gabapentin (NEURONTIN) 300 MG capsule Take  1 capsule  3 x /day  or Chronic Pain    (Dx: m79.7)                                               /                                TAKE                                   BY                                  MOUTH     levothyroxine (SYNTHROID) 88 MCG tablet Take  1 tablet  Daily  on an empty stomach with only water for 30 minutes & no Antacid meds, Calcium or Magnesium for 4 hours & avoid Biotin 90 tablet 3   meclizine (ANTIVERT) 25 MG tablet TAKE 1 TABLET (25  MG TOTAL) BY MOUTH 3 (THREE) TIMES DAILY AS NEEDED FOR DIZZINESS. 90 tablet 0   mupirocin ointment (BACTROBAN) 2 % Apply 1 Application topically 3 (three) times daily as needed (lichen planus).      neomycin-polymyxin-hydrocortisone (CORTISPORIN) OTIC solution Place 3 drops into both ears daily as needed (ear inflammation).     nystatin cream (MYCOSTATIN) Apply 1 Application topically 2 (two) times daily. 30 g 0   omeprazole (PRILOSEC) 40 MG capsule TAKE 1 CAPSULE EVERY DAY 90 capsule 3   OVER THE COUNTER MEDICATION Place 1 drop under the tongue daily. D3 5000 units - K2 120 mcg     polyethylene glycol (MIRALAX / GLYCOLAX) 17 g packet Take 17-34 g by mouth at bedtime.     risankizumab-rzaa ([name]) 600 MG/10ML injection Inject 10 mLs into the vein every 8 (eight) weeks.     rOPINIRole (REQUIP) 2 MG tablet Take  1 tablet  2 x / day  &  2 tablets  at Bedtime  for Restless Legs 360 tablet 3   tiZANidine (ZANAFLEX) 2 MG tablet TAKE 1 TABLET AT BEDTIME FOR MUSCLE SPASM 90 tablet 3   traMADol (ULTRAM) 50 MG tablet Take 50 mg by mouth every 6 (six) hours as needed for moderate pain or severe pain.     No facility-administered medications prior to visit.   Review of Systems  Review of Systems  Constitutional:  Positive for fatigue.  HENT:  Positive for congestion and postnasal drip.   Respiratory:  Positive for shortness of breath. Negative for wheezing.        SOB attributed to nasal congestion and deviated nasal septum    Physical Exam  BP (!) 140/88 (Cuff Size: Normal)   Pulse 75   Ht 5\' 3"  (1.6 m)   Wt 152 lb (68.9 kg)   SpO2 96%   BMI 26.93 kg/m  Physical Exam Constitutional:      Appearance: Normal appearance.  HENT:     Head: Normocephalic and atraumatic.     Mouth/Throat:     Mouth: Mucous membranes are moist.     Pharynx: Oropharynx is clear.  Cardiovascular:     Rate and Rhythm: Normal rate and regular rhythm.  Pulmonary:     Effort: Pulmonary effort is normal.     Breath sounds: Normal breath sounds.  Skin:    General: Skin is warm and dry.  Neurological:     General: No focal deficit present.     Mental Status: She is alert and oriented to person, place, and  time. Mental status is at baseline.  Psychiatric:        Mood and Affect: Mood normal.        Behavior: Behavior normal.        Thought Content: Thought content normal.        Judgment: Judgment normal.      Lab Results:  CBC    Component Value Date/Time   WBC 7.9 03/11/2023 1539   RBC 5.08 03/11/2023 1539   HGB 15.7 (H) 03/11/2023 1539   HGB 12.9 10/20/2012 1358   HCT 47.3 (H) 03/11/2023 1539   HCT 40.2 10/20/2012 1358   PLT 260 03/11/2023 1539   PLT 265 10/20/2012 1358   MCV 93.1 03/11/2023 1539   MCV 82.1 10/20/2012 1358   MCH 30.9 03/11/2023 1539   MCHC 33.2 03/11/2023 1539   RDW 12.2 03/11/2023 1539   RDW 16.9 (H) 10/20/2012 1358   LYMPHSABS 1,327 03/11/2023 1539   LYMPHSABS  1.3 10/20/2012 1358   MONOABS 0.7 01/19/2021 2316   MONOABS 0.6 10/20/2012 1358   EOSABS 71 03/11/2023 1539   EOSABS 0.1 10/20/2012 1358   BASOSABS 63 03/11/2023 1539   BASOSABS 0.1 10/20/2012 1358    BMET    Component Value Date/Time   NA 142 03/11/2023 1539   NA 142 03/28/2017 0000   NA 141 10/20/2012 1358   K 3.9 03/11/2023 1539   K 3.9 10/20/2012 1358   CL 100 03/11/2023 1539   CL 104 10/20/2012 1358   CO2 32 03/11/2023 1539   CO2 27 10/20/2012 1358   GLUCOSE 104 (H) 03/11/2023 1539   GLUCOSE 109 (H) 10/20/2012 1358   BUN 18 03/11/2023 1539   BUN 14 03/28/2017 0000   BUN 15.6 10/20/2012 1358   CREATININE 1.02 (H) 03/11/2023 1539   CREATININE 1.1 10/20/2012 1358   CALCIUM 10.1 03/11/2023 1539   CALCIUM 7.8 (L) 03/26/2017 0354   CALCIUM 9.7 10/20/2012 1358   GFRNONAA >60 01/10/2022 1551   GFRNONAA 80 07/04/2020 1457   GFRAA 93 07/04/2020 1457    BNP    Component Value Date/Time   BNP 32.3 01/19/2021 2316    ProBNP No results found for: "PROBNP"  Imaging: No results found.   Assessment & Plan:   1. Bronchiectasis without complication (HCC) - DG Chest 2 View; Future  2. Encounter for pre-operative respiratory clearance  3. OSA (obstructive sleep  apnea)   Surgical Clearance for Left Shoulder Surgery Patient is planning for reversal left total shoulder replacement with Dr. Lottie Dawson tentatively set for January 1st.  Exam today was benign.  We will get a preop chest x-ray due to her history of bronchiectasis.  She will be considered intermediate risk for prolonged mechanical ventilation and/pulmonary complication.  She is optimized from surgery from pulmonary standpoint.  Ultimate clearance will be decided upon by anesthesiology and surgery. Would recommend surgery be performed in a hospital setting due to patient's history of sleep apnea and bronchiectasis. Advise patient to bring Inspire remote on the day of surgery. Encourage early ambulation and use of incentive spirometer post-surgery to prevent respiratory complications.  Allergic Rhinitis Patient reports increased difficulty breathing, possibly due to allergies and/or deviated nasal septum. Currently using Flonase once daily and an over-the-counter allergy medication, possibly Allegra. -Add Montelukast (Singulair) to current regimen. -Continue Flonase daily -Resume use of saline nasal rinse 1-2 times daily. -Refill Fluticasone nasal spray as patient reports running low.  Sleep Apnea Hx sleep apnea, intolerant to CPAP. Patient uses Inspire device consistently.  - She is 80% compliant with usage greater than 4 hours over the last 30 days.  Average pauses per night 0.0. Current amplitude 2.0 voltage (level 4). -Continue use of Inspire device nightly. -Bring Inspire remote to surgery.  Bronchiectasis without complication  - No active cough - Continue mucinex 600mg  1-2 time a day prn  - CXR pre-op clearance    Glenford Bayley, NP 05/05/2023

## 2023-05-05 NOTE — Telephone Encounter (Signed)
Patient states that she took her [name] injection on 05/03/23 but later realized she was not due to take the medication until 05/20/23. She states that when she did the injection, she realized that she did not receive the full amount of medication because medication was leaking down her leg. She states that she called [facility] and they told her to ask her physician what she should do regarding this situation. Would you have her to get a replacement injector and re-inject or would you have her to wait 8 more weeks for her routine injection?

## 2023-05-05 NOTE — Telephone Encounter (Signed)
If she did not get a full dose or thinks the medicine ran down her leg then she should have another injector and take a regular dose at her scheduled day of November 20.  If she thought she had the full dose and then I would not recommend another dose and would have it next done 8 weeks from her last injection, but that does not appear to be the case.  She should contact [facility] to get another dose for 11/20 if possible, if she did not receive the full dose when she give it to herself on 11/3.[name]  Thanks

## 2023-05-06 ENCOUNTER — Ambulatory Visit: Payer: Medicare PPO | Admitting: Nurse Practitioner

## 2023-05-08 ENCOUNTER — Ambulatory Visit: Payer: Medicare PPO | Admitting: Gastroenterology

## 2023-05-13 ENCOUNTER — Other Ambulatory Visit: Payer: Self-pay | Admitting: Nurse Practitioner

## 2023-05-13 ENCOUNTER — Telehealth: Payer: Self-pay

## 2023-05-13 ENCOUNTER — Encounter: Payer: Self-pay | Admitting: Gastroenterology

## 2023-05-13 ENCOUNTER — Ambulatory Visit (INDEPENDENT_AMBULATORY_CARE_PROVIDER_SITE_OTHER): Payer: Medicare PPO | Admitting: Gastroenterology

## 2023-05-13 VITALS — BP 110/70 | HR 50 | Ht 63.0 in | Wt 156.1 lb

## 2023-05-13 DIAGNOSIS — Z79899 Other long term (current) drug therapy: Secondary | ICD-10-CM | POA: Diagnosis not present

## 2023-05-13 DIAGNOSIS — K50018 Crohn's disease of small intestine with other complication: Secondary | ICD-10-CM | POA: Diagnosis not present

## 2023-05-13 DIAGNOSIS — E039 Hypothyroidism, unspecified: Secondary | ICD-10-CM

## 2023-05-13 DIAGNOSIS — L439 Lichen planus, unspecified: Secondary | ICD-10-CM

## 2023-05-13 DIAGNOSIS — K649 Unspecified hemorrhoids: Secondary | ICD-10-CM | POA: Diagnosis not present

## 2023-05-13 DIAGNOSIS — K5909 Other constipation: Secondary | ICD-10-CM | POA: Diagnosis not present

## 2023-05-13 DIAGNOSIS — M797 Fibromyalgia: Secondary | ICD-10-CM

## 2023-05-13 MED ORDER — NA SULFATE-K SULFATE-MG SULF 17.5-3.13-1.6 GM/177ML PO SOLN: 1.0000 | Freq: Once | ORAL | 0 refills | Status: DC

## 2023-05-13 MED ORDER — CALMOL-4 76-10 % RE SUPP: RECTAL | Status: AC

## 2023-05-13 NOTE — Patient Instructions (Addendum)
You have been scheduled for a colonoscopy. Please follow written instructions given to you at your visit today.   Please pick up your prep supplies at the pharmacy within the next 1-3 days.  If you use inhalers (even only as needed), please bring them with you on the day of your procedure.  DO NOT TAKE 7 DAYS PRIOR TO TEST- Trulicity (dulaglutide) Ozempic, Wegovy (semaglutide) Mounjaro (tirzepatide) Bydureon Bcise (exanatide extended release)  DO NOT TAKE 1 DAY PRIOR TO YOUR TEST Rybelsus (semaglutide) Adlyxin (lixisenatide) Victoza (liraglutide) Byetta (exanatide) ___________________________________________________________________________  Please go to the lab in the basement of our building to have lab work done as you leave today. Hit "B" for basement when you get on the elevator.  When the doors open the lab is on your left.  We will call you with the results. Thank you.  When you go to your PCP's office please ask him to do a lab for Dr. Adela Lank: Juanetta Gosling Assay TB   Continue [name].  Please purchase the following medications over the counter and take as directed: Miralax: use 3 doses daily  We have given you samples of the following medication to take: Calmol 4 suppositories: Use as directed as needed  Please follow up in 6 months.  Thank you for entrusting me with your care and for choosing [facility], Dr. Ileene Patrick   If your blood pressure at your visit was 140/90 or greater, please contact your primary care physician to follow up on this. ______________________________________________________  If you are age 41 or older, your body mass index should be between 23-30. Your Body mass index is 27.66 kg/m. If this is out of the aforementioned range listed, please consider follow up with your Primary Care Provider.  If you are age 18 or younger, your body mass index should be between 19-25. Your Body mass index is 27.66 kg/m. If this is out of  the aformentioned range listed, please consider follow up with your Primary Care Provider.  ________________________________________________________  The [facility] providers would like to encourage you to use [facility] to communicate with providers for non-urgent requests or questions.  Due to long hold times on the telephone, sending your provider a message by [facility] may be a faster and more efficient way to get a response.  Please allow 48 business hours for a response.  Please remember that this is for non-urgent requests.  _______________________________________________________  Due to recent changes in healthcare laws, you may see the results of your imaging and laboratory studies on MyChart before your provider has had a chance to review them.  We understand that in some cases there may be results that are confusing or concerning to you. Not all laboratory results come back in the same time frame and the provider may be waiting for multiple results in order to interpret others.  Please give [place] 48 hours in order for your provider to thoroughly review all the results before contacting the office for clarification of your results.

## 2023-05-13 NOTE — Telephone Encounter (Signed)
Please review patient's chart regarding appropriateness of upcoming colonoscopy procedure in the [facility]. Patient saw Pulmonology on 05-05-23. Please advise.

## 2023-05-13 NOTE — Progress Notes (Signed)
HPI :  72 year old female here for a follow up visit for Crohn's disease of the ileum.  Recall she also has history of lichen planus followed by dermatology at [facility], history of arthritis followed by rheumatology  She was previously followed by Dr. Kinnie Scales for Crohn's disease, we have assumed her care in the past year   Crohn's history Dx Crohn's ileitis back in 2019 or so, however she has had ileitis noted on colonoscopies dating back for years before that however biopsies never confirmed the diagnosis. She was on Humira for a few years after her initial diagnosis.  From review of the record it was a question of she was having multiple falls due to demyelination?  However it was continued despite that.  She had multiple orthopedic surgeries in 2021 2022, she had an infection from a knee replacement, had been off Humira for several months and then ultimately just stopped it She has been off Humira now for a few years. She has never had a surgery for Crohn's disease or hospitalization from this. Started [name] 2024   SINCE LAST VISIT   Recall at our last visit in February she was not on any dedicated Crohn's medications.  We had previously done a CT enterography in February  to restage her Crohn's disease.  She had active inflammation in her ileum along with some suggestion of fibrotic disease as well.  She had a fecal calprotectin that was over 1000.  Recall she has been followed by [facility] dermatology for lichen planus in her mouth.  She previously was on hydroxychloroquine but at the time of her last visit was transitioned to azathioprine.  She was on 100 mg daily but stopped that due to intolerance.  She states she felt fatigue and achiness all over and did not tolerate it.   After her last visit I spoke with her dermatologist, we reviewed options for her Crohn's disease and lichen planus and elected to start her on [name].  So far she has had a few fusions of this and has had 2 injections.  Her last  injection she states was not completely successful and she felt most of it running down her leg.  She actually took it a few weeks earlier than she was supposed to and the drug company has worked to get her another dose for her regularly scheduled dose.  Generally she has been tolerating it okay.  She states she has gained some weight since she has been starting this.  Her abdomen is not bothering her as much as it used to.  She does have chronic constipation.  MiraLAX has helped her when she takes it but she has not been taking it recently and constipation with subsequently worsened with irritated hemorrhoids as well.  She was taking MiraLAX 2-3 times a day previously that worked okay.  She denies any blood in her stools.  She is followed by dermatology at [facility], oral ulcerations/lichen planus has not helped as much recently.  She was placed on some fluconazole for possible candidiasis.  She is up to date with her vaccinations otherwise.  She states she recently had her flu shot.  Up-to-date with Shingrix vaccine, pneumococcal vaccine, COVID booster.  Her last colonoscopy was in 2019, she had ileitis with 1 small adenoma.  Also had a MR enterography in 2016.      Colonoscopy 2019 - finding of chronic active ileitis with ulceration and a tubular adenoma.    Review of her medical chart, found earlier  ulcerative inflammation of the terminal ileum, at colonoscopy 2010 and 2014. Biospies confirmed active inflammation, but granulomas have not been found. Small bowel series confirmed ileal inflammation, consistent with Crohn's enteritis of the terminal ileum.    MRE 07/2014: IMPRESSION:   1. Long segment of bowel wall thickening, mucosal enhancement and luminal narrowing involving 20 cm of the distal ileum. No evidence of bowel obstruction or proximal dilatation. No fecalization. The inflammation is present on comparison exam but the narrowing has progressed. 2. No fisutala or abscess.      Echocardiogram 02/28/2022 - EF 55-60%,    CT enterography 07/30/22: IMPRESSION: 1. Persistent long segment distal and terminal ileal wall thickening, consistent with the clinical history of Crohn disease. The hyperenhancing terminal ileal component is similar and the extent of mucosal hyperenhancement within the distal most ileum is decreased compared to 08/25/2014 MR enterography. Upstream dilatation is new/increased, consistent with a component of obstruction/stenosis. 2. No other complication and no new sites of active inflammation identified. 3.  Aortic Atherosclerosis (ICD10-I70.0).     Fecal calprotectin 07/15/22 - 1060     Past Medical History:  Diagnosis Date   Arthritis    ASCVD (arteriosclerotic cardiovascular disease)    Closed nondisplaced subtrochanteric fracture of left femur (HCC) 09/03/2017   September 2018.  Status post surgery by Dr. Carola Frost   Crohn's disease of ileum without complication (HCC)    DDD (degenerative disc disease), lumbar    Depression    Dyspnea    Eye infection    Fibromyalgia    Gait disorder 10/26/2012   GERD (gastroesophageal reflux disease)    GI bleed    Hyperlipidemia    Hypertension    Hypothyroid    Periprosthetic supracondylar fracture of femur, left  03/23/2017   Pneumonia    Pre-diabetes    Restless leg syndrome    Rhinitis 06/25/2010   Sleep apnea    does not use CPAP   Spinal headache    with the C section   TIA (transient ischemic attack)    3         Vitamin D deficiency      Past Surgical History:  Procedure Laterality Date   ABDOMINAL HYSTERECTOMY  1992   ANTERIOR CERVICAL DECOMPRESSION/DISCECTOMY FUSION 4 LEVELS N/A 06/14/2015   Procedure: Cervical three-four Cervical four-five Cervical five- six Cervical six- seven  Anterior cervical decompression/diskectomy/fusion;  Surgeon: Maeola Harman, MD;  Location: [facility];  Service: Neurosurgery;  Laterality: N/A;  C3-4 C4-5 C5-6 C6-7 Anterior cervical  decompression/diskectomy/fusion   APPENDECTOMY  1960   BACK SURGERY     CATARACT EXTRACTION, BILATERAL     CESAREAN SECTION  1983 & 1886   DRUG INDUCED ENDOSCOPY N/A 02/25/2022   Procedure: DRUG INDUCED SLEEP ENDOSCOPY;  Surgeon: Osborn Coho, MD;  Location: [facility];  Service: ENT;  Laterality: N/A;   EYE SURGERY     bilateral cataracts   FINGER ARTHROSCOPY     IMPLANTATION OF HYPOGLOSSAL NERVE STIMULATOR Right 05/29/2022   Procedure: IMPLANTATION OF HYPOGLOSSAL NERVE STIMULATOR;  Surgeon: Osborn Coho, MD;  Location[facility];  Service: ENT;  Laterality: Right;   JOINT REPLACEMENT     2   LEFT  1 RIGHT    KNEE SURGERY     ORIF FEMUR FRACTURE Left 03/24/2017   Procedure: OPEN REDUCTION INTERNAL FIXATION (ORIF) FEMUR FRACTURE;  Surgeon: Myrene Galas, MD;  Location: [facility] OR;  Service: Orthopedics;  Laterality: Left;   ROTATOR CUFF REPAIR Left 2005  2 LEFT  1  RIGHT   SHOULDER ADHESION RELEASE     SPINAL CORD DECOMPRESSION     SPINE SURGERY     TOE SURGERY     LEFT        TOE SURGERY     RIGHT  BIG TOE   TONSILLECTOMY  1960   TRANSFORAMINAL LUMBAR INTERBODY FUSION W/ MIS 1 LEVEL Left 09/05/2021   Procedure: MINIMALLY INVASIVE TRANSFORAMINAL LUMBAR INTERBODY FUSION LUMBAR THREE-FOUR; EXPLORE FUSION WITH REVISION OF HARDWARE LUMBAR FOUR-FIVE,LEFT SIDE APPROACH;  Surgeon: Bethann Goo, DO;  Location: [facility] OR;  Service: Neurosurgery;  Laterality: Left;   Family History  Problem Relation Age of Onset   Cancer Mother    Diabetes Mother    Osteoporosis Mother    Breast cancer Mother    Arthritis Mother    Heart disease Father    Hypertension Father    Hyperlipidemia Father    Neuropathy Father    Leukemia Sister    Lupus Sister    Depression Sister    Rheum arthritis Sister    Migraines Daughter    Liver disease Neg Hx    Colon cancer Neg Hx    Esophageal cancer Neg Hx    Social History   Tobacco Use   Smoking status: Never    Passive exposure: Past    Smokeless tobacco: Never  Vaping Use   Vaping status: Never Used  Substance Use Topics   Alcohol use: Yes    Alcohol/week: 2.0 standard drinks of alcohol    Types: 2 Standard drinks or equivalent per week    Comment: social   Drug use: No   Current Outpatient Medications  Medication Sig Dispense Refill   acyclovir (ZOVIRAX) 400 MG tablet TAKE 1 TABLET BY MOUTH EVERY DAY 90 tablet 1   benzonatate (TESSALON) 200 MG capsule Take 1 perle up to 3 x/day ONLY if Needed for Severe Cough               ( DX:  r05.9 )                                  /                                                                   TAKE                                         BY                                                 MOUTH 30 capsule 0   bisoprolol-hydrochlorothiazide (ZIAC) 10-6.25 MG tablet Take 1 tablet by mouth daily. 30 tablet 2   Black Pepper-Turmeric (TURMERIC PLUS BLACK PEPPER EXT PO) Take by mouth.     buPROPion (WELLBUTRIN XL) 300 MG 24 hr tablet Take  1 tablet  Daily  for Mood, Focus & Concentration                                                          /  TAKE                                         BY                                                 MOUTH 90 tablet 3   carboxymethylcellulose (REFRESH PLUS) 0.5 % SOLN Place 1 drop into both eyes 3 (three) times daily as needed (dry eyes).     CINNAMON PO Take by mouth.     clobetasol ointment (TEMOVATE) 0.05 % Apply 1 Application topically 3 (three) times daily as needed (lichen planus).     diclofenac Sodium (VOLTAREN) 1 % GEL Apply 2 g topically daily as needed (pain). 4 g 1   fenofibrate micronized (LOFIBRA) 134 MG capsule TAKE 1 CAPSULE EVERY DAY BEFORE BREAKFAST 90 capsule 3   Ferrous Sulfate (IRON PO) Take 25 mg by mouth daily.     fluconazole (DIFLUCAN) 100 MG tablet Take 100 mg by mouth daily.     fluticasone (FLONASE) 50 MCG/ACT nasal spray Use  1-2 Sprays  each nostril    1-2 x /day  as needed 16 g 5   gabapentin (NEURONTIN) 300 MG capsule Take  1 capsule  3 x /day  or Chronic Pain    (Dx: m79.7)                                               /                                TAKE                                   BY                                  MOUTH     meclizine (ANTIVERT) 25 MG tablet TAKE 1 TABLET (25 MG TOTAL) BY MOUTH 3 (THREE) TIMES DAILY AS NEEDED FOR DIZZINESS. 90 tablet 0   montelukast (SINGULAIR) 10 MG tablet Take 1 tablet (10 mg total) by mouth at bedtime. 30 tablet 5   mupirocin ointment (BACTROBAN) 2 % Apply 1 Application topically 3 (three) times daily as needed (lichen planus).     neomycin-polymyxin-hydrocortisone (CORTISPORIN) OTIC solution Place 3 drops into both ears daily as needed (ear inflammation).     nystatin cream (MYCOSTATIN) Apply 1 Application topically 2 (two) times daily. 30 g 0   omeprazole (PRILOSEC) 40 MG capsule TAKE 1 CAPSULE EVERY DAY 90 capsule 3   OVER THE COUNTER MEDICATION Place 1 drop under the tongue daily. D3 5000 units - K2 120 mcg     polyethylene glycol (MIRALAX / GLYCOLAX) 17 g packet Take 17-34 g by mouth at bedtime.     risankizumab-rzaa ([name]) 600 MG/10ML injection Inject 10 mLs into the vein every 8 (eight) weeks.     rOPINIRole (REQUIP) 2 MG tablet Take  1 tablet  2 x / day  &  2 tablets  at Bedtime  for Restless Legs 360 tablet 3   tiZANidine (ZANAFLEX) 2 MG tablet TAKE 1 TABLET AT BEDTIME FOR MUSCLE SPASM 90 tablet 3   traMADol (ULTRAM) 50 MG tablet Take 50 mg by mouth every 6 (six) hours as needed for moderate pain or severe pain.     Vitamin D-Vitamin K (K2-D3 5000 PO) Take 5,000 Units by mouth daily.     DULoxetine (CYMBALTA) 60 MG capsule TAKE 1 CAPSULE EVERY DAY FOR CHRONIC PAIN 90 capsule 3   levothyroxine (SYNTHROID) 88 MCG tablet TAKE 1 TAB DAILY ON AN EMPTY STOMACH WITH WATER X 30 MIN. NO ANTACID, CALCIUM, MAGNESIUM X 4 HRS AND AVOID BIOTIN 90 tablet 3   No current facility-administered  medications for this visit.   Allergies  Allergen Reactions   Methocarbamol Shortness Of Breath, Nausea Only and Other (See Comments)    Other reaction(s): Dizziness, psychological reaction, disoriented Sleepy and weight loss    Codeine Other (See Comments)    Headache   Statins Other (See Comments)    Extreme pain   Azathioprine    Other Other (See Comments)    MINT=sore mouth and stomach upset   Oxycodone     Dizziness, unbalanced.   Amitriptyline Other (See Comments)    Confusion and hallucinations   Erythromycin Base Rash    Flushing/red face.   Fish Oil Nausea Only   Flax Seed [Flax Seed Oil] Rash   Flaxseed (Linseed) Rash   Hydrocodone Itching and Rash        Morphine Nausea And Vomiting and Rash   Nsaids Nausea Only   Nuvigil [Armodafinil] Other (See Comments)    Unspecified   Sertraline Rash   Sinemet [Carbidopa W-Levodopa] Rash   Sulfa Antibiotics Rash   Tolmetin Nausea Only     Review of Systems: All systems reviewed and negative except where noted in HPI.    No results found.  Physical Exam: BP 110/70 (BP Location: Left Arm, Patient Position: Sitting, Cuff Size: Normal)   Pulse (!) 50   Ht 5\' 3"  (1.6 m)   Wt 156 lb 2 oz (70.8 kg)   SpO2 96%   BMI 27.66 kg/m  Constitutional: Pleasant,well-developed, female in no acute distress. Neurological: Alert and oriented to person place and time. Psychiatric: Normal mood and affect. Behavior is normal.   ASSESSMENT: 72 y.o. female here for assessment of the following  1. Crohn's disease of small intestine with other complication (HCC)   2. Lichen planus   3. High risk medication use   4. Chronic constipation   5. Hemorrhoids, unspecified hemorrhoid type    Longstanding Crohn's disease as above, switch to [employer] earlier this year for treatment of her Crohn's disease and lichen planus.  She is tolerating the regimen well and generally doing okay.  Her main complaint with her bowels is chronic  constipation that has worsened since she stopped the MiraLAX.  We discussed options for treatment of constipation.  She is willing to resume the MiraLAX, she was taking it 3 times daily, she can take a double or triple dose once daily if that is easier for her.  If she would prefer to use a more aggressive regimen we can consider that however she is willing to stick with MiraLAX for now.  I provided her some Calmol 4 samples for treatment of hemorrhoids which have been irritated in light of her constipation.  We reviewed  her Cristy Folks and risks of this.  She appears to be tolerating it well so far.  We will continue it for now.  She is due for TB testing, will send her to the lab for QuantiFERON gold.  Vaccinations are up-to-date.  I would also like to repeat a fecal calprotectin to see where this is trending now that she has been on therapy for a few months.  We also discussed when to do surveillance colonoscopy.  She has a history of an adenoma removed 5 years ago, she has not had restaging of her disease in some time endoscopically.  I offered her colonoscopy for this purpose now that she has been on [name] for some time and she is agreeable.  Will use a double prep for her exam.  She does have a history of bronchiectasis and sleep apnea but not on any supplemental oxygen at seems stable from this perspective to be done at the office, will confirm with anesthesia staff.   PLAN: - continue [name] - due for lab - quantiferon gold - lab for fecal calprotectin - colonoscopy at the [facility] - double prep - resume Miralax daily - three doses / day - Calmol 4 suppositories - sample provided - vaccinations are UTD - follow up in 6 months or sooner with issues  Harlin Rain, MD [facility]

## 2023-05-13 NOTE — Telephone Encounter (Signed)
John this patient was seen today in the office, has history of sleep apnea and bronchiectasis.  She is not on any supplemental oxygen and appears stable.  She is having major shoulder surgery in the hospital and that required preoperative evaluation but nothing further was recommended by pulmonary.  I think okay to proceed in the [facility] unless you feel otherwise.  Thanks

## 2023-05-14 NOTE — Telephone Encounter (Signed)
Thanks Crystal [name].  [name], okay to proceed with colonoscopy at the [facility]

## 2023-05-15 NOTE — Telephone Encounter (Signed)
Copy of ov note with risk assessment from 05/04/23 was faxed to Dr Austin Miles office.

## 2023-05-17 ENCOUNTER — Encounter: Payer: Self-pay | Admitting: Internal Medicine

## 2023-05-17 NOTE — Patient Instructions (Signed)

## 2023-05-17 NOTE — Progress Notes (Unsigned)
[name]  [name], [name], [name], [name]  [facility] [address]  [place], [place]. [address] Telephone [phone] Telefax [phone]   Annual Screening/Preventative Visit & Comprehensive Evaluation &  Examination   Future Appointments  Date Time Provider Department  05/18/2023             cpe  2:00 PM [name], MD [facility]  06/12/2023  3:00 PM Armbruster, Willaim Rayas, MD [facility]  07/14/2023  2:30 PM Glenford Bayley, NP [facility]  06/07/2024             cpe  2:00 PM [name], MD [facility]        This very nice 72 y.o. [name] with  HTN, HLD, diet controlled T2_DM, Hypothyroidism, OSA/BiPAP (hx/o mask intolerance) and Vitamin D Deficiency presents for a Screening /Preventative Visit & comprehensive evaluation and management of multiple medical co-morbidities.   Patient has Crohn's Colitis managed by Dr Adela Lank.  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 Chest CT scan in 2019 showed Aortic Atherosclerosis.         Patient is followed by Dr Inis Sizer, [facility] for oral Lichen Planus treating with Flagyl & Fluconazole.   Patient also  has a Chronic Pain Syndrome consequent of multiple Neck & back surgeries & is on Cymbalta/Gabapentin to avoid chronic Opioids. In June, 2022,  patient underwent Rt TKR (26th surgery) .  She is tentatively scheduled for a shoulder replacement in early 2025 by Dr Everardo Pacific.  Patient has had hx/o  #27 previous surgeries to date.            HTN predates circa 2006 . Patient's BP has been controlled at home and patient denies any cardiac symptoms as chest pain, palpitations, shortness of breath, dizziness or ankle swelling. Today's BP is at goal - 138/80 .        Patient's hyperlipidemia is controlled with diet and medications. Patient denies myalgias or other medication SE's. Last lipids were at goal except elevated  trig's:  Lab Results  Component Value Date   CHOL 200 (H) 01/21/2023   HDL 51 01/21/2023   LDLCALC 108 (H) 01/21/2023   TRIG 310 (H) 01/21/2023   CHOLHDL 3.9 01/21/2023         Patient has obesity (BMI 30+)  and consequent  diet controlled T2_DM/CKD2 (GFR 75)  since 2016 (A1c 6.9%) and patient denies reactive hypoglycemic symptoms, visual blurring, diabetic polys or paresthesias. Last A1c was  not at goal:  Lab Results  Component Value Date   HGBA1C 5.5 05/12/2022         Finally, patient has history of Vitamin D Deficiency ("33" /2008) and last Vitamin D was at goal:  Lab Results  Component Value Date   VD25OH 54 10/17/2022       Current Outpatient Medications  Medication Instructions   Acyclovir 400 mg Daily   benzonatate  200 MG capsule Take 1 perle up to 3 x/day ONLY if Needed   bisoprolol-hctz 10-6.25 MG tablet 1 tablet  Daily,     Black Pepper-Turmeric     buPROPion XL 300 MG  Take  1 tablet  Daily     REFRESH PLUS 0.5 % SOLN 1 drop, Both Eyes, 3 times daily PRN   CINNAMON     clobetasol ointment  0.05 % 1 Application 3 times daily PRN   Diclofenac 2 Topical Daily PRN  DULoxetine 60 MG capsule TAKE 1 CAPSULE EVERY DAY    fenofibrate  134 MG capsule TAKE 1 CAPSULE EVERY DAY    Ferrous Sulfate 25 mg  Daily   fluconazole   100 mg Daily   FLONASE  nasal spray Use  1-2 Sprays  each nostril   1-2 x /day  as needed   gabapentin 300 MG capsule Take  1 capsule  3 x /day     levothyroxine  88 MCG tablet TAKE 1 TAB DAILY    meclizine 25 mg , Oral, 3 times daily PRN   montelukast 10 mg , Oral, Daily at bedtime   mupirocin ointment  2 % 1 Application, Topical, 3 times daily PRN   CORTISPORIN OTIC solution 3 drops, Both EARS, Daily PRN   nystatin cream  1 Application 2 times daily   omeprazole  40 mg Daily   polyethylene glycol 17-51 g Take 3 doses daily   CALMOL-4   76-10 % SUPP Use as directed once to twice daily   [name] 600 MG/10ML injec 10 mLs  IV  Every 8 weeks    rOPINIRole 2 MG tablet Take 1 tablet 2 x /day & 2 tablets at Bedtime     tiZANidine 2 MG tablet TAKE 1 TABLET AT BEDTIME   traMADol  50 mg , Oral, Every 6 hours PRN   Vit D -Vit K 2  5000 u / 100 mcg Daily     Allergies  Allergen Reactions   Atorvastatin Other (See Comments)    Extreme pain   Codeine Other (See Comments)    Headache   Pravastatin Other (See Comments)    Exreme pain   Statins Other (See Comments)    Extreme pain   Amitriptyline Other (See Comments)    Unspecified   Carbidopa    Fish Oil Nausea Only   Hydrocodone Other (See Comments)    Itching Other Reaction: Other reaction    Linseed Oil Other (See Comments)    Other Reaction: Other reaction   Nuvigil [Armodafinil] Other (See Comments)    Unspecified   Sulfa Antibiotics Other (See Comments)   Erythromycin Other (See Comments)    Reaction unspecified   Erythromycin Base Rash   Flax Seed [Bio-Flax] Rash    Linseed   Hydrocodone-Acetaminophen Rash   Morphine Nausea And Vomiting and Rash   Nsaids Nausea Only and Other (See Comments)    Other Reaction: Other reaction   Sertraline Rash   Sinemet [Carbidopa W-Levodopa] Rash   Sulfamethoxazole Rash   Tolmetin Nausea Only     Past Medical History:  Diagnosis Date   Arthritis    ASCVD (arteriosclerotic cardiovascular disease)    Closed nondisplaced subtrochanteric fracture of left femur (HCC) 09/03/2017   September 2018.  Status post surgery by Dr. Carola Frost   Depression    Fibromyalgia    Gait disorder 10/26/2012   GERD (gastroesophageal reflux disease)    GI bleed    Hyperlipidemia    Hypertension    Hypothyroid    Periprosthetic supracondylar fracture of femur, left  03/23/2017   Restless leg syndrome    Rhinitis 06/25/2010   Sleep apnea    TIA (transient ischemic attack)    3         Vitamin D deficiency      Health Maintenance  Topic Date Due   Zoster Vaccines- Shingrix (1 of 2) Never done   COVID-19 Vaccine (4 - Booster for Pfizer  series) 10/05/2020   INFLUENZA  VACCINE  01/28/2021   FOOT EXAM  03/19/2021   HEMOGLOBIN A1C  04/18/2021   OPHTHALMOLOGY EXAM  09/25/2021   MAMMOGRAM  07/09/2022   TETANUS/TDAP  01/02/2025   COLONOSCOPY (Pts 45-71yrs Insurance coverage will need to be confirmed)  10/14/2027   DEXA SCAN  Completed   Hepatitis C Screening  Completed   HPV VACCINES  Aged Out     Immunization History  Administered Date(s) Administered   DT  01/03/2015   Influenza   02/27/2016, 04/16/2018   Influenza  03/30/2010   Influenza, High Dose 03/13/2015, 07/21/2019, 02/16/2020   Influenza 03/31/2011, 05/14/2014, 02/27/2016   PFIZER Covid-19 Tri-Sucrose Vacc 09/19/2019, 10/10/2019, 07/13/2020   PFIZER SARS-COV-2 Vacc 09/19/2019, 10/10/2019   Pneumococcal -13 06/30/2005, 04/17/2015, 01/23/2019   Pneumococcal-23 01/06/2017   Pneumococcal-23 06/30/2005   Td 06/30/2004   Zoster, Live 07/01/2007    Last Colon -  10/03/2017 - Dr Kinnie Scales   and advised repeat  [name] 2023 by Dr Kinnie Scales   Last MGM - 07/09/2020   Past Surgical History:  Procedure Laterality Date   ABDOMINAL HYSTERECTOMY  1992   ANTERIOR CERVICAL DECOMPRESSION/DISCECTOMY FUSION 4 LEVELS N/A 06/14/2015   Procedure: Cervical three-four Cervical four-five Cervical five- six Cervical six- seven  Anterior cervical decompression/diskectomy/fusion;  Surgeon: Maeola Harman, MD;  Location: [facility];  Service: Neurosurgery;  Laterality: N/A;  C3-4 C4-5 C5-6 C6-7 Anterior cervical decompression/diskectomy/fusion   APPENDECTOMY  1960   CESAREAN SECTION  1983 & 1886   JOINT REPLACEMENT     2   LEFT  1 RIGHT    KNEE SURGERY     ORIF FEMUR FRACTURE Left 03/24/2017   Procedure: OPEN REDUCTION INTERNAL FIXATION (ORIF) FEMUR FRACTURE;  Surgeon: Myrene Galas, MD;  Location: [facility];  Service: Orthopedics;  Laterality: Left;   ROTATOR CUFF REPAIR Left 2005   2 LEFT  1  RIGHT   SHOULDER ADHESION RELEASE     SPINE SURGERY     TOE SURGERY     LEFT        TOE  SURGERY     RIGHT  BIG TOE   TONSILLECTOMY  1960     Family History  Problem Relation Age of Onset   Leukemia Sister    Lupus Sister    Depression Sister    Rheum arthritis Sister    Heart disease Father    Hypertension Father    Hyperlipidemia Father    Neuropathy Father    Cancer Mother    Diabetes Mother    Osteoporosis Mother    Breast cancer Mother    Migraines Daughter      Social History   Tobacco Use   Smoking status: Never   Smokeless tobacco: Never  Vaping Use   Vaping Use: Never used  Substance Use Topics   Alcohol use: Yes    Alcohol/week: 0.0 standard drinks    Comment: rarely   Drug use: No      ROS Constitutional: Denies fever, chills, weight loss/gain, headaches, insomnia,  night sweats, and change in appetite. Does c/o fatigue. Eyes: Denies redness, blurred vision, diplopia, discharge, itchy, watery eyes.  ENT: Denies discharge, congestion, post nasal drip, epistaxis, sore throat, earache, hearing loss, dental pain, Tinnitus, Vertigo, Sinus pain, snoring.  Cardio: Denies chest pain, palpitations, irregular heartbeat, syncope, dyspnea, diaphoresis, orthopnea, PND, claudication, edema Respiratory: denies cough, dyspnea, DOE, pleurisy, hoarseness, laryngitis, wheezing.  Gastrointestinal: Denies dysphagia, heartburn, reflux, water brash, pain, cramps, nausea, vomiting, bloating, diarrhea, constipation, hematemesis, melena, hematochezia, jaundice,  hemorrhoids Genitourinary: Denies dysuria, frequency, urgency, nocturia, hesitancy, discharge, hematuria, flank pain Breast: Breast lumps, nipple discharge, bleeding.  Musculoskeletal: Denies arthralgia, myalgia, stiffness, Jt. Swelling, pain, limp, and strain/sprain. Denies falls. Skin: Denies puritis, rash, hives, warts, acne, eczema, changing in skin lesion Neuro: No weakness, tremor, incoordination, spasms, paresthesia, pain Psychiatric: Denies confusion, memory loss, sensory loss. Denies  Depression. Endocrine: Denies change in weight, skin, hair change, nocturia, and paresthesia, diabetic polys, visual blurring, hyper / hypo glycemic episodes.  Heme/Lymph: No excessive bleeding, bruising, enlarged lymph nodes.  Physical Exam  There were no vitals taken for this visit.  General Appearance: Well nourished, well groomed and in no apparent distress.  Eyes: PERRLA, EOMs, conjunctiva no swelling or erythema, normal fundi and vessels. Sinuses: No frontal/maxillary tenderness ENT/Mouth: EACs patent / TMs  nl. Nares clear without erythema, swelling, mucoid exudates. Oral hygiene is good. No erythema, swelling, or exudate. Tongue normal, non-obstructing. Tonsils not swollen or erythematous. Hearing normal.  Neck: Supple, thyroid not palpable. No bruits, nodes or JVD. Respiratory: Respiratory effort normal.  BS equal and clear bilateral without rales, rhonci, wheezing or stridor. Cardio: Heart sounds are normal with regular rate and rhythm and no murmurs, rubs or gallops. Peripheral pulses are normal and equal bilaterally without edema. No aortic or femoral bruits. Chest: symmetric with normal excursions and percussion. Breasts: deferred to [facility]   08/22/2021 Abdomen: Flat, soft with bowel sounds active. Nontender, no guarding, rebound, hernias, masses, or organomegaly.  Lymphatics: Non tender without lymphadenopathy.  Musculoskeletal: Full ROM all peripheral extremities, joint stability, 5/5 strength, and normal gait. Skin: Warm and dry without rashes, lesions, cyanosis, clubbing or  ecchymosis.  Neuro:  Mild Lt hemi-facial paresis with widening of Lt interpalpebral fissure. Cranial nerves intact, reflexes equal bilaterally. Normal muscle tone, no cerebellar symptoms. Sensation intact.  Pysch: Alert and oriented X 3, normal affect, Insight and Judgment appropriate.    Assessment and Plan  1. Annual Preventative Screening Examination   2. Essential hypertension  - EKG 12-Lead -  Urinalysis, Routine w reflex microscopic - Microalbumin / creatinine urine ratio - CBC with Differential/Platelet - COMPLETE METABOLIC PANEL WITH GFR - Magnesium - TSH   3. Hyperlipidemia associated with type 2 diabetes mellitus (HCC)  - EKG 12-Lead - Lipid panel - TSH   4. Type 2 diabetes mellitus with stage 2 chronic kidney                                            disease, without long-term current use of insulin (HCC)  - EKG 12-Lead - HM DIABETES FOOT EXAM - PR LOW EXTEMITY NEUR EXAM DOCUM - Hemoglobin A1c - Insulin, random   5. Statin myopathy  - Lipid panel   6. Vitamin D deficiency  - VITAMIN D 25 Hydroxy    7. Aortic atherosclerosis (HCC) by Chest CT scan on 09/2017  - EKG 12-Lead   8. Hypothyroidism  - TSH   9. Crohn's disease of small intestine without complication (HCC)  - CBC with Differential/Platelet - COMPLETE METABOLIC PANEL WITH GFR   10. Obstructive sleep apnea   11. Screening for colorectal cancer  - POC Hemoccult Bld/Stl   12. Screening for heart disease  - EKG 12-Lead   13. FHx: heart disease  - EKG 12-Lead   14. Medication management  - Urinalysis, Routine w reflex microscopic - Microalbumin / creatinine urine ratio -  CBC with Differential/Platelet - COMPLETE METABOLIC PANEL WITH GFR - Magnesium - Lipid panel - TSH - Hemoglobin A1c - Insulin, random - VITAMIN D 25 Hydroxy          Patient was counseled in prudent diet to achieve/maintain BMI less than 25 for weight control, BP monitoring, regular exercise and medications. Discussed med's effects and SE's. Screening labs and tests as requested with regular follow-up as recommended. Over 40 minutes of exam, counseling, chart review and high complex critical decision making was performed.   Marinus Maw, MD

## 2023-05-18 ENCOUNTER — Encounter: Payer: Self-pay | Admitting: Internal Medicine

## 2023-05-18 ENCOUNTER — Ambulatory Visit (INDEPENDENT_AMBULATORY_CARE_PROVIDER_SITE_OTHER): Payer: Medicare PPO | Admitting: Internal Medicine

## 2023-05-18 VITALS — BP 124/78 | HR 68 | Temp 97.9°F | Resp 16 | Ht 63.5 in | Wt 153.8 lb

## 2023-05-18 DIAGNOSIS — Z8249 Family history of ischemic heart disease and other diseases of the circulatory system: Secondary | ICD-10-CM

## 2023-05-18 DIAGNOSIS — I10 Essential (primary) hypertension: Secondary | ICD-10-CM | POA: Diagnosis not present

## 2023-05-18 DIAGNOSIS — E1122 Type 2 diabetes mellitus with diabetic chronic kidney disease: Secondary | ICD-10-CM

## 2023-05-18 DIAGNOSIS — G720 Drug-induced myopathy: Secondary | ICD-10-CM | POA: Diagnosis not present

## 2023-05-18 DIAGNOSIS — R61 Generalized hyperhidrosis: Secondary | ICD-10-CM

## 2023-05-18 DIAGNOSIS — E785 Hyperlipidemia, unspecified: Secondary | ICD-10-CM | POA: Diagnosis not present

## 2023-05-18 DIAGNOSIS — Z0000 Encounter for general adult medical examination without abnormal findings: Secondary | ICD-10-CM

## 2023-05-18 DIAGNOSIS — E1169 Type 2 diabetes mellitus with other specified complication: Secondary | ICD-10-CM | POA: Diagnosis not present

## 2023-05-18 DIAGNOSIS — I700 Atherosclerosis of aorta: Secondary | ICD-10-CM

## 2023-05-18 DIAGNOSIS — Z111 Encounter for screening for respiratory tuberculosis: Secondary | ICD-10-CM

## 2023-05-18 DIAGNOSIS — K5000 Crohn's disease of small intestine without complications: Secondary | ICD-10-CM

## 2023-05-18 DIAGNOSIS — E039 Hypothyroidism, unspecified: Secondary | ICD-10-CM

## 2023-05-18 DIAGNOSIS — Z136 Encounter for screening for cardiovascular disorders: Secondary | ICD-10-CM

## 2023-05-18 DIAGNOSIS — E559 Vitamin D deficiency, unspecified: Secondary | ICD-10-CM

## 2023-05-18 DIAGNOSIS — G4733 Obstructive sleep apnea (adult) (pediatric): Secondary | ICD-10-CM

## 2023-05-18 DIAGNOSIS — Z79899 Other long term (current) drug therapy: Secondary | ICD-10-CM

## 2023-05-18 DIAGNOSIS — Z0001 Encounter for general adult medical examination with abnormal findings: Secondary | ICD-10-CM

## 2023-05-18 MED ORDER — OXYBUTYNIN CHLORIDE 5 MG PO TABS: ORAL_TABLET | ORAL | 3 refills | Status: DC

## 2023-05-19 NOTE — Progress Notes (Signed)
TB test - = Quantiferon - TB Gold is still Pending

## 2023-05-19 NOTE — Progress Notes (Signed)
<>*<>*<>*<>*<>*<>*<>*<>*<>*<>*<>*<>*<>*<>*<>*<>*<>*<>*<>*<>*<>*<>*<>*<>*<> <>*<>*<>*<>*<>*<>*<>*<>*<>*<>*<>*<>*<>*<>*<>*<>*<>*<>*<>*<>*<>*<>*<>*<>*<>  -Test results slightly outside the reference range are not unusual. If there is anything important, I will review this with you,  otherwise it is considered normal test values.  If you have further questions,  please do not hesitate to contact me at the office or via My Chart.   <>*<>*<>*<>*<>*<>*<>*<>*<>*<>*<>*<>*<>*<>*<>*<>*<>*<>*<>*<>*<>*<>*<>*<>*<> <>*<>*<>*<>*<>*<>*<>*<>*<>*<>*<>*<>*<>*<>*<>*<>*<>*<>*<>*<>*<>*<>*<>*<>*<>  -  Kidney Functions  ( Creatinine  & GFR ) have decreased from Stage 2 (GFR  88) to Stage 3a  ( GFR 58) over the last 3 months   - Kidney functions  look a little dehydrated    Very important to drink adequate amounts of fluids to prevent permanent damage    - Recommend drink at least 6 bottles (16 ounces) of fluids /water /day = 96 Oz ~100 oz  - 100 oz = 3,000 cc or 3 liters / day  - >> That's 1 &1/2 bottles of a 2 liter soda bottle /day !   <>*<>*<>*<>*<>*<>*<>*<>*<>*<>*<>*<>*<>*<>*<>*<>*<>*<>*<>*<>*<>*<>*<>*<>*<> <>*<>*<>*<>*<>*<>*<>*<>*<>*<>*<>*<>*<>*<>*<>*<>*<>*<>*<>*<>*<>*<>*<>*<>*<>  -   - Total Chol =  259   is very high risk for Heart Attack /Stroke /Vascular Dementia     ( Ideal or Goal is less than 180 ! )  & - Bad /Dangerous LDL Chol =   167 - - >> Sitting on a time Bomb !     ( Ideal or Goal is less than 70 ! )   - Treating with meds to lower Cholesterol is treating the result                                           & NOT treating the cause  - The cause is Bad Diet !  - But need to go ahead & start meds until get on a better diet to try &                                                        reverse some of the Damage already done   - Read or listen to   Dr Gerri Spore 's book    " How Not to Die ! "    - Recommend  a stricter plant based low cholesterol diet   - Cholesterol only comes from animal sources                                                                  - ie. meat, dairy, egg yolks  - Eat all the vegetables you want.  - Avoid Meat, Avoid Meat , Avoid Meat  ! ! !                                                   -especially red meat - Beef AND Pork  - Avoid cheese & dairy - milk & ice cream.   - Cheese is the most concentrated form  of trans-fats which                                                     is the worst thing to clog up our arteries.   - Veggie cheese is OK which can be found in                                              the fresh produce section at                                                          [employer] or [employer] or [employer]  <>*<>*<>*<>*<>*<>*<>*<>*<>*<>*<>*<>*<>*<>*<>*<>*<>*<>*<>*<>*<>*<>*<>*<>*<> <>*<>*<>*<>*<>*<>*<>*<>*<>*<>*<>*<>*<>*<>*<>*<>*<>*<>*<>*<>*<>*<>*<>*<>*<>  -  ALSO Triglycerides ( = 248 ) or fats in blood are too high                 (   Ideal or  Goal is less than 150  !  )    - Recommend avoid fried & greasy foods,  sweets / candy,   - Avoid white rice  (brown or wild rice or Quinoa is OK),   - Avoid white potatoes  (sweet potatoes are OK)   - Avoid anything made from white flour  - bagels, doughnuts, rolls, buns, biscuits, white and   wheat breads, pizza crust and traditional  pasta made of white flour & egg white  - (vegetarian pasta or spinach or wheat pasta is OK).    - Multi-grain bread is OK - like multi-grain flat bread or                                                                                                                                                                                         sandwich  thins.   - Avoid alcohol in excess.   - Exercise is also  important.  <>*<>*<>*<>*<>*<>*<>*<>*<>*<>*<>*<>*<>*<>*<>*<>*<>*<>*<>*<>*<>*<>*<>*<>*<> <>*<>*<>*<>*<>*<>*<>*<>*<>*<>*<>*<>*<>*<>*<>*<>*<>*<>*<>*<>*<>*<>*<>*<>*<>  -  A1c = 6.1% -  Blood sugar and A1c are elevated in the borderline and  early or pre-diabetes range which has the same   300% increased risk for heart attack, stroke, cancer and                                          alzheimer- type vascular dementia as full blown diabetes.   But the good news is that diet, exercise with  weight loss can cure the early diabetes at this point.  <>*<>*<>*<>*<>*<>*<>*<>*<>*<>*<>*<>*<>*<>*<>*<>*<>*<>*<>*<>*<>*<>*<>*<>*<> <>*<>*<>*<>*<>*<>*<>*<>*<>*<>*<>*<>*<>*<>*<>*<>*<>*<>*<>*<>*<>*<>*<>*<>*<>  -  Vitamin D= 38   IS VERY LOW  !  - Vitamin D goal is between 70-100.   - Recommend add 5,000 units more Vit D  / day                                                                    ( to a daily dose of 10,000 Vitamin D / day )   - It is very important as a natural anti-inflammatory and helping the                           immune system protect against viral infections, like the Covid-19    helping hair, skin, and nails, as well as reducing stroke and heart attack risk.   - It helps your bones and helps with mood.  - It also decreases numerous cancer risks so please                                                                                           take it as directed.   - Low Vit D is associated with a 200-300% higher risk for CANCER   and 200-300% higher risk for HEART   ATTACK  &  STROKE.    - It is also associated with higher death rate at younger ages,   autoimmune diseases like Rheumatoid arthritis, Lupus, Multiple Sclerosis.     - Also many other serious  conditions, like depression, Alzheimer's  Dementia,  muscle aches, fatigue, fibromyalgia   <>*<>*<>*<>*<>*<>*<>*<>*<>*<>*<>*<>*<>*<>*<>*<>*<>*<>*<>*<>*<>*<>*<>*<>*<> <>*<>*<>*<>*<>*<>*<>*<>*<>*<>*<>*<>*<>*<>*<>*<>*<>*<>*<>*<>*<>*<>*<>*<>*<>

## 2023-05-20 LAB — CBC WITH DIFFERENTIAL/PLATELET
Absolute Lymphocytes: 1642 {cells}/uL (ref 850–3900)
Absolute Monocytes: 621 {cells}/uL (ref 200–950)
Basophils Absolute: 104 {cells}/uL (ref 0–200)
Basophils Relative: 1.5 %
Eosinophils Absolute: 173 {cells}/uL (ref 15–500)
Eosinophils Relative: 2.5 %
HCT: 49.1 % — ABNORMAL HIGH (ref 35.0–45.0)
Hemoglobin: 16.3 g/dL — ABNORMAL HIGH (ref 11.7–15.5)
MCH: 31 pg (ref 27.0–33.0)
MCHC: 33.2 g/dL (ref 32.0–36.0)
MCV: 93.5 fL (ref 80.0–100.0)
MPV: 9.5 fL (ref 7.5–12.5)
Monocytes Relative: 9 %
Neutro Abs: 4361 {cells}/uL (ref 1500–7800)
Neutrophils Relative %: 63.2 %
Platelets: 307 10*3/uL (ref 140–400)
RBC: 5.25 10*6/uL — ABNORMAL HIGH (ref 3.80–5.10)
RDW: 12.7 % (ref 11.0–15.0)
Total Lymphocyte: 23.8 %
WBC: 6.9 10*3/uL (ref 3.8–10.8)

## 2023-05-20 LAB — HEMOGLOBIN A1C
Hgb A1c MFr Bld: 6.1 %{Hb} — ABNORMAL HIGH (ref <5.7)
Mean Plasma Glucose: 128 mg/dL
eAG (mmol/L): 7.1 mmol/L

## 2023-05-20 LAB — URINALYSIS, ROUTINE W REFLEX MICROSCOPIC
Bilirubin Urine: NEGATIVE
Glucose, UA: NEGATIVE
Hgb urine dipstick: NEGATIVE
Ketones, ur: NEGATIVE
Leukocytes,Ua: NEGATIVE
Nitrite: NEGATIVE
Protein, ur: NEGATIVE
Specific Gravity, Urine: 1.021 (ref 1.001–1.035)
pH: <=5 (ref 5.0–8.0)

## 2023-05-20 LAB — COMPLETE METABOLIC PANEL WITH GFR
AG Ratio: 1.8 (calc) (ref 1.0–2.5)
ALT: 11 U/L (ref 6–29)
AST: 20 U/L (ref 10–35)
Albumin: 4.7 g/dL (ref 3.6–5.1)
Alkaline phosphatase (APISO): 54 U/L (ref 37–153)
BUN/Creatinine Ratio: 19 (calc) (ref 6–22)
BUN: 20 mg/dL (ref 7–25)
CO2: 33 mmol/L — ABNORMAL HIGH (ref 20–32)
Calcium: 10.4 mg/dL (ref 8.6–10.4)
Chloride: 101 mmol/L (ref 98–110)
Creat: 1.03 mg/dL — ABNORMAL HIGH (ref 0.60–1.00)
Globulin: 2.6 g/dL (ref 1.9–3.7)
Glucose, Bld: 104 mg/dL — ABNORMAL HIGH (ref 65–99)
Potassium: 4.3 mmol/L (ref 3.5–5.3)
Sodium: 143 mmol/L (ref 135–146)
Total Bilirubin: 0.7 mg/dL (ref 0.2–1.2)
Total Protein: 7.3 g/dL (ref 6.1–8.1)
eGFR: 57 mL/min/{1.73_m2} — ABNORMAL LOW (ref >=60)

## 2023-05-20 LAB — MAGNESIUM: Magnesium: 2.3 mg/dL (ref 1.5–2.5)

## 2023-05-20 LAB — LIPID PANEL
Cholesterol: 259 mg/dL — ABNORMAL HIGH (ref <200)
HDL: 51 mg/dL (ref >=50)
LDL Cholesterol (Calc): 167 mg/dL — ABNORMAL HIGH
Non-HDL Cholesterol (Calc): 208 mg/dL — ABNORMAL HIGH (ref <130)
Total CHOL/HDL Ratio: 5.1 (calc) — ABNORMAL HIGH (ref <5.0)
Triglycerides: 248 mg/dL — ABNORMAL HIGH (ref <150)

## 2023-05-20 LAB — VITAMIN D 25 HYDROXY (VIT D DEFICIENCY, FRACTURES): Vit D, 25-Hydroxy: 38 ng/mL (ref 30–100)

## 2023-05-20 LAB — QUANTIFERON-TB GOLD PLUS
Mitogen-NIL: 6.63 [IU]/mL
NIL: 0.01 [IU]/mL
QuantiFERON-TB Gold Plus: NEGATIVE
TB1-NIL: 0 [IU]/mL
TB2-NIL: 0 [IU]/mL

## 2023-05-20 LAB — INSULIN, RANDOM: Insulin: 10.9 u[IU]/mL

## 2023-05-20 LAB — MICROALBUMIN / CREATININE URINE RATIO
Creatinine, Urine: 110 mg/dL (ref 20–275)
Microalb Creat Ratio: 2 mg/g{creat} (ref <30)
Microalb, Ur: 0.2 mg/dL

## 2023-05-20 LAB — TSH: TSH: 2.5 m[IU]/L (ref 0.40–4.50)

## 2023-05-20 NOTE — Progress Notes (Signed)
<>*<>*<>*<>*<>*<>*<>*<>*<>*<>*<>*<>*<>*<>*<>*<>*<>*<>*<>*<>*<>*<>*<>*<>*<> <>*<>*<>*<>*<>*<>*<>*<>*<>*<>*<>*<>*<>*<>*<>*<>*<>*<>*<>*<>*<>*<>*<>*<>*<>  -   QuantiFERON-TB Gold Plus   is  NEGATIVE   Negative test result    -->   M. tuberculosis complex  -   infection unlikely  <>*<>*<>*<>*<>*<>*<>*<>*<>*<>*<>*<>*<>*<>*<>*<>*<>*<>*<>*<>*<>*<>*<>*<>*<> <>*<>*<>*<>*<>*<>*<>*<>*<>*<>*<>*<>*<>*<>*<>*<>*<>*<>*<>*<>*<>*<>*<>*<>*<>

## 2023-05-21 DIAGNOSIS — B370 Candidal stomatitis: Secondary | ICD-10-CM | POA: Diagnosis not present

## 2023-05-21 DIAGNOSIS — L988 Other specified disorders of the skin and subcutaneous tissue: Secondary | ICD-10-CM | POA: Diagnosis not present

## 2023-05-21 DIAGNOSIS — L438 Other lichen planus: Secondary | ICD-10-CM | POA: Diagnosis not present

## 2023-05-24 ENCOUNTER — Other Ambulatory Visit: Payer: Self-pay | Admitting: Nurse Practitioner

## 2023-05-24 ENCOUNTER — Encounter: Payer: Self-pay | Admitting: Internal Medicine

## 2023-05-24 DIAGNOSIS — G2581 Restless legs syndrome: Secondary | ICD-10-CM

## 2023-05-24 NOTE — Progress Notes (Signed)
Please send lab letter

## 2023-05-25 ENCOUNTER — Encounter: Payer: Self-pay | Admitting: Internal Medicine

## 2023-05-25 NOTE — Progress Notes (Signed)
CXR without acute findings, no evidence of PNA or bronchitis. Pre op clearance stands

## 2023-06-01 ENCOUNTER — Ambulatory Visit (INDEPENDENT_AMBULATORY_CARE_PROVIDER_SITE_OTHER): Payer: Medicare PPO | Admitting: Nurse Practitioner

## 2023-06-01 ENCOUNTER — Encounter: Payer: Self-pay | Admitting: Nurse Practitioner

## 2023-06-01 VITALS — BP 128/80 | HR 81 | Temp 97.8°F | Ht 63.5 in | Wt 155.2 lb

## 2023-06-01 DIAGNOSIS — I10 Essential (primary) hypertension: Secondary | ICD-10-CM

## 2023-06-01 DIAGNOSIS — I951 Orthostatic hypotension: Secondary | ICD-10-CM | POA: Diagnosis not present

## 2023-06-01 DIAGNOSIS — M62838 Other muscle spasm: Secondary | ICD-10-CM

## 2023-06-01 NOTE — Patient Instructions (Signed)
Orthostatic Hypotension Blood pressure is a measurement of how strongly, or weakly, your circulating blood is pressing against the walls of your arteries. Orthostatic hypotension is a drop in blood pressure that can happen when you change positions, such as when you go from lying down to standing. Arteries are blood vessels that carry blood from your heart throughout your body. When blood pressure is too low, you may not get enough blood to your brain or to the rest of your organs. Orthostatic hypotension can cause light-headedness, sweating, rapid heartbeat, blurred vision, and fainting. These symptoms require further investigation into the cause. What are the causes? Orthostatic hypotension can be caused by many things, including: Sudden changes in posture, such as standing up quickly after you have been sitting or lying down. Loss of blood (anemia) or loss of body fluids (dehydration). Heart problems, neurologic problems, or hormone problems. Pregnancy. Aging. The risk for this condition increases as you get older. Severe infection (sepsis). Certain medicines, such as medicines for high blood pressure or medicines that make the body lose excess fluids (diuretics). What are the signs or symptoms? Symptoms of this condition may include: Weakness, light-headedness, or dizziness. Sweating. Blurred vision. Tiredness (fatigue). Rapid heartbeat. Fainting, in severe cases. How is this diagnosed? This condition is diagnosed based on: Your symptoms and medical history. Your blood pressure measurements. Your health care provider will check your blood pressure when you are: Lying down. Sitting. Standing. A blood pressure reading is recorded as two numbers, such as "120 over 80" (or 120/80). The first ("top") number is called the systolic pressure. It is a measure of the pressure in your arteries as your heart beats. The second ("bottom") number is called the diastolic pressure. It is a measure of  the pressure in your arteries when your heart relaxes between beats. Blood pressure is measured in a unit called mmHg. Healthy blood pressure for most adults is 120/80 mmHg. Orthostatic hypotension is defined as a 20 mmHg drop in systolic pressure or a 10 mmHg drop in diastolic pressure within 3 minutes of standing. Other information or tests that may be used to diagnose orthostatic hypotension include: Your other vital signs, such as your heart rate and temperature. Blood tests. An electrocardiogram (ECG) or echocardiogram. A Holter monitor. This is a device you wear that records your heart rhythm continuously, usually for 24-48 hours. Tilt table test. For this test, you will be safely secured to a table that moves you from a lying position to an upright position. Your heart rhythm and blood pressure will be monitored during the test. How is this treated? This condition may be treated by: Changing your diet. This may involve eating more salt (sodium) or drinking more water. Changing the dosage of certain medicines you are taking that might be lowering your blood pressure. Correcting the underlying reason for the orthostatic hypotension. Wearing compression stockings. Taking medicines to raise your blood pressure. Avoiding actions that trigger symptoms. Follow these instructions at home: Medicines Take over-the-counter and prescription medicines only as told by your health care provider. Follow instructions from your health care provider about changing the dosage of your current medicines, if this applies. Do not stop or adjust any of your medicines on your own. Eating and drinking  Drink enough fluid to keep your urine pale yellow. Eat extra salt only as directed. Do not add extra salt to your diet unless advised by your health care provider. Eat frequent, small meals. Avoid standing up suddenly after eating. General instructions  Get up slowly from lying down or sitting positions. This  gives your blood pressure a chance to adjust. Avoid hot showers and excessive heat as directed by your health care provider. Engage in regular physical activity as directed by your health care provider. If you have compression stockings, wear them as told. Keep all follow-up visits. This is important. Contact a health care provider if: You have a fever for more than 2-3 days. You feel more thirsty than usual. You feel dizzy or weak. Get help right away if: You have chest pain. You have a fast or irregular heartbeat. You become sweaty or feel light-headed. You feel short of breath. You faint. You have any symptoms of a stroke. "BE FAST" is an easy way to remember the main warning signs of a stroke: B - Balance. Signs are dizziness, sudden trouble walking, or loss of balance. E - Eyes. Signs are trouble seeing or a sudden change in vision. F - Face. Signs are sudden weakness or numbness of the face, or the face or eyelid drooping on one side. A - Arms. Signs are weakness or numbness in an arm. This happens suddenly and usually on one side of the body. S - Speech. Signs are sudden trouble speaking, slurred speech, or trouble understanding what people say. T - Time. Time to call emergency services. Write down what time symptoms started. You have other signs of a stroke, such as: A sudden, severe headache with no known cause. Nausea or vomiting. Seizure. These symptoms may represent a serious problem that is an emergency. Do not wait to see if the symptoms will go away. Get medical help right away. Call your local emergency services (911 in the [place] Do not drive yourself to the hospital. Summary Orthostatic hypotension is a sudden drop in blood pressure. It can cause light-headedness, sweating, rapid heartbeat, blurred vision, and fainting. Orthostatic hypotension can be diagnosed by having your blood pressure taken while lying down, sitting, and then standing. Treatment may involve  changing your diet, wearing compression stockings, sitting up slowly, adjusting your medicines, or correcting the underlying reason for the orthostatic hypotension. Get help right away if you have chest pain, a fast or irregular heartbeat, or symptoms of a stroke. This information is not intended to replace advice given to you by your health care provider. Make sure you discuss any questions you have with your health care provider. Document Revised: 08/30/2020 Document Reviewed: 08/30/2020 Elsevier Patient Education  2024 [employer].

## 2023-06-01 NOTE — Progress Notes (Signed)
Assessment and Plan:  Crystal "[name]" was seen today for acute visit.  Diagnoses and all orders for this visit:  Essential hypertension - continue medications: Ziac 10/6.25 mg every day  - continue DASH diet, exercise and monitor at home. Call if greater than 130/80.   Muscle spasm Advised to have massage or dry needling Continue tizanidine  Orthostatic hypotension Change positions slowly   Stay well hydrated    Further disposition pending results of labs. Discussed med's effects and SE's.   Over 30 minutes of exam, counseling, chart review, and critical decision making was performed.   Future Appointments  Date Time Provider Department Center  06/09/2023  1:30 PM Armbruster, Willaim Rayas, MD [facility]  07/14/2023  2:30 PM Glenford Bayley, NP [facility] None  08/26/2023  2:30 PM Adela Glimpse, NP [facility] None  11/24/2023  2:30 PM [name], MD [facility] None  02/24/2024  2:30 PM Adela Glimpse, NP [facility] None  06/07/2024  2:00 PM [name], MD [facility] None    ------------------------------------------------------------------------------------------------------------------   HPI BP 128/80   Pulse 81   Temp 97.8 F (36.6 C)   Ht 5' 3.5" (1.613 m)   Wt 155 lb 3.2 oz (70.4 kg)   SpO2 96%   BMI 27.06 kg/m   72 y.o.female presents for dizziness, nausea and blurred vision when changing position. Has been occurring for about a week.  She has fatigue that has been longstanding. Fatigue has been present since Covid over Labor day.   Is having issues with her allergies and is using singulair and Flonase. She has not been on zyrtec , allegra or claritin  BP well controlled with Ziac 10/6.25 mg every day  BP Readings from Last 3 Encounters:  06/01/23 128/80  05/18/23 124/78  05/13/23 110/70  Denies headaches, chest pain, shortness of breath and dizziness   Lying :120/84, sitting:127/78, Standing 102/62  BMI is Body mass index is 27.06  kg/m., she has been working on diet and exercise. Wt Readings from Last 3 Encounters:  06/01/23 155 lb 3.2 oz (70.4 kg)  05/18/23 153 lb 12.8 oz (69.8 kg)  05/13/23 156 lb 2 oz (70.8 kg)     Past Medical History:  Diagnosis Date   Arthritis    ASCVD (arteriosclerotic cardiovascular disease)    Closed nondisplaced subtrochanteric fracture of left femur (HCC) 09/03/2017   September 2018.  Status post surgery by Dr. Carola Frost   Crohn's disease of ileum without complication (HCC)    DDD (degenerative disc disease), lumbar    Depression    Dyspnea    Eye infection    Fibromyalgia    Gait disorder 10/26/2012   GERD (gastroesophageal reflux disease)    GI bleed    Hyperlipidemia    Hypertension    Hypothyroid    Periprosthetic supracondylar fracture of femur, left  03/23/2017   Pneumonia    Pre-diabetes    Restless leg syndrome    Rhinitis 06/25/2010   Sleep apnea    does not use CPAP   Spinal headache    with the C section   TIA (transient ischemic attack)    3         Vitamin D deficiency      Allergies  Allergen Reactions   Methocarbamol Shortness Of Breath, Nausea Only and Other (See Comments)    Other reaction(s): Dizziness, psychological reaction, disoriented Sleepy and weight loss    Codeine Other (See Comments)    Headache   Statins Other (See Comments)  Extreme pain   Azathioprine    Other Other (See Comments)    MINT=sore mouth and stomach upset   Oxycodone     Dizziness, unbalanced.   Amitriptyline Other (See Comments)    Confusion and hallucinations   Erythromycin Base Rash    Flushing/red face.   Fish Oil Nausea Only   Flax Seed [Flax Seed Oil] Rash   Flaxseed (Linseed) Rash   Hydrocodone Itching and Rash        Morphine Nausea And Vomiting and Rash   Nsaids Nausea Only   Nuvigil [Armodafinil] Other (See Comments)    Unspecified   Sertraline Rash   Sinemet [Carbidopa W-Levodopa] Rash   Sulfa Antibiotics Rash   Tolmetin Nausea Only     Current Outpatient Medications on File Prior to Visit  Medication Sig   acyclovir (ZOVIRAX) 400 MG tablet TAKE 1 TABLET BY MOUTH EVERY DAY   bisoprolol-hydrochlorothiazide (ZIAC) 10-6.25 MG tablet Take 1 tablet by mouth daily.   Black Pepper-Turmeric (TURMERIC PLUS BLACK PEPPER EXT PO) Take by mouth.   buPROPion (WELLBUTRIN XL) 300 MG 24 hr tablet Take  1 tablet  Daily  for Mood, Focus & Concentration                                                          /                                                                   TAKE                                         BY                                                 MOUTH   carboxymethylcellulose (REFRESH PLUS) 0.5 % SOLN Place 1 drop into both eyes 3 (three) times daily as needed (dry eyes).   CINNAMON PO Take by mouth.   clobetasol ointment (TEMOVATE) 0.05 % Apply 1 Application topically 3 (three) times daily as needed (lichen planus).   dexamethasone (DECADRON) 0.5 MG/5ML solution TAKE 5 MLS (0.5 MG TOTAL) BY MOUTH 1 of 2 TIMES DAILY as needed   diclofenac Sodium (VOLTAREN) 1 % GEL Apply 2 g topically daily as needed (pain).   DULoxetine (CYMBALTA) 60 MG capsule TAKE 1 CAPSULE EVERY DAY FOR CHRONIC PAIN   fenofibrate micronized (LOFIBRA) 134 MG capsule TAKE 1 CAPSULE EVERY DAY BEFORE BREAKFAST   Ferrous Sulfate (IRON PO) Take 25 mg by mouth daily.   fluconazole (DIFLUCAN) 100 MG tablet Take 100 mg by mouth daily.   fluticasone (FLONASE) 50 MCG/ACT nasal spray Use  1-2 Sprays  each nostril   1-2 x /day  as needed   gabapentin (NEURONTIN) 300 MG capsule Take  1 capsule  3 x /day  or Chronic Pain    (  Dx: m79.7)                                               /                                TAKE                                   BY                                  MOUTH   levothyroxine (SYNTHROID) 88 MCG tablet TAKE 1 TAB DAILY ON AN EMPTY STOMACH WITH WATER X 30 MIN. NO ANTACID, CALCIUM, MAGNESIUM X 4 HRS AND AVOID BIOTIN   meclizine  (ANTIVERT) 25 MG tablet TAKE 1 TABLET (25 MG TOTAL) BY MOUTH 3 (THREE) TIMES DAILY AS NEEDED FOR DIZZINESS.   montelukast (SINGULAIR) 10 MG tablet Take 1 tablet (10 mg total) by mouth at bedtime.   mupirocin ointment (BACTROBAN) 2 % Apply 1 Application topically 3 (three) times daily as needed (lichen planus).   neomycin-polymyxin-hydrocortisone (CORTISPORIN) OTIC solution Place 3 drops into both ears daily as needed (ear inflammation).   nystatin cream (MYCOSTATIN) Apply 1 Application topically 2 (two) times daily.   omeprazole (PRILOSEC) 40 MG capsule TAKE 1 CAPSULE EVERY DAY   OVER THE COUNTER MEDICATION Place 1 drop under the tongue daily. D3 5000 units - K2 120 mcg   oxybutynin (DITROPAN) 5 MG tablet Take 1 tablet 2 x /day fr Excessive Sweating   polyethylene glycol (MIRALAX / GLYCOLAX) 17 g packet Take 17-51 g by mouth 3 (three) times daily. Take 3 doses daily   Rectal Protectant-Emollient (CALMOL-4) 76-10 % SUPP Use as directed once to twice daily   risankizumab-rzaa ([name]) 600 MG/10ML injection Inject 10 mLs into the vein every 8 (eight) weeks.   rOPINIRole (REQUIP) 2 MG tablet TAKE 1 TABLET TWICE DAILY AND TAKE 2 TABLETS AT BEDTIME FOR RESTLESS LEGS   tiZANidine (ZANAFLEX) 2 MG tablet TAKE 1 TABLET AT BEDTIME FOR MUSCLE SPASM   traMADol (ULTRAM) 50 MG tablet Take 50 mg by mouth every 6 (six) hours as needed for moderate pain or severe pain.   tretinoin (RETIN-A) 0.1 % cream Apply a pea-sized amount to your entire face. Start using this every other night, then increase to nightly as tolerated. You can mix this with a gentle moisturizer (Cerave, Cetaphil) if your skin becomes too dry.   Vitamin D-Vitamin K (K2-D3 5000 PO) Take 5,000 Units by mouth daily.   benzonatate (TESSALON) 200 MG capsule Take 1 perle up to 3 x/day ONLY if Needed for Severe Cough               ( DX:  r05.9 )                                  /  TAKE                                          BY                                                 MOUTH (Patient not taking: Reported on 06/01/2023)   No current facility-administered medications on file prior to visit.    ROS: all negative except above.   Physical Exam:  BP 128/80   Pulse 81   Temp 97.8 F (36.6 C)   Ht 5' 3.5" (1.613 m)   Wt 155 lb 3.2 oz (70.4 kg)   SpO2 96%   BMI 27.06 kg/m   General Appearance: Well nourished, in no apparent distress. Eyes: PERRLA, EOMs, conjunctiva no swelling or erythema Sinuses: No Frontal/maxillary tenderness ENT/Mouth: Ext aud canals clear, TMs without erythema, bulging. No erythema, swelling, or exudate on post pharynx.  Tonsils not swollen or erythematous. Hearing normal.  Neck: Supple, thyroid normal.  Respiratory: Respiratory effort normal, BS equal bilaterally without rales, rhonchi, wheezing or stridor.  Cardio: RRR with no MRGs. Brisk peripheral pulses without edema.  Abdomen: Soft, + BS.  Non tender, no guarding, rebound, hernias, masses. Lymphatics: Non tender without lymphadenopathy.  Musculoskeletal: Full ROM, 5/5 strength, normal gait. Muscle spasm noted lumbar and left shoulder Skin: Warm, dry without rashes, lesions, ecchymosis.  Neuro: Cranial nerves intact. Normal muscle tone, no cerebellar symptoms. Sensation decreased in toes bilaterally. Right side of mouth is drawn down slightly from previous Bell's Palsy Psych: Awake and oriented X 3, normal affect, Insight and Judgment appropriate.     Raynelle Dick, NP 11:17 AM [name]

## 2023-06-02 ENCOUNTER — Telehealth: Payer: Self-pay | Admitting: Gastroenterology

## 2023-06-02 NOTE — Telephone Encounter (Signed)
Patent called stated she still doe snot have her prep medication.

## 2023-06-03 MED ORDER — NA SULFATE-K SULFATE-MG SULF 17.5-3.13-1.6 GM/177ML PO SOLN: 1.0000 | Freq: Once | ORAL | 0 refills | Status: AC

## 2023-06-03 NOTE — Telephone Encounter (Signed)
Called and left message for patient that Suprep was resent to [facility]

## 2023-06-03 NOTE — Telephone Encounter (Signed)
Suprep was sent to patient's pharmacy on 05-13-23.  [facility] [address] Patient must not have picked it up.  Script was [facility] to [facility].

## 2023-06-09 ENCOUNTER — Ambulatory Visit: Payer: Medicare PPO | Admitting: Gastroenterology

## 2023-06-09 ENCOUNTER — Encounter: Payer: Self-pay | Admitting: Gastroenterology

## 2023-06-09 VITALS — BP 109/63 | HR 65 | Temp 97.2°F | Resp 13 | Ht 63.0 in | Wt 156.2 lb

## 2023-06-09 DIAGNOSIS — D120 Benign neoplasm of cecum: Secondary | ICD-10-CM

## 2023-06-09 DIAGNOSIS — K50018 Crohn's disease of small intestine with other complication: Secondary | ICD-10-CM

## 2023-06-09 DIAGNOSIS — Z1211 Encounter for screening for malignant neoplasm of colon: Secondary | ICD-10-CM

## 2023-06-09 DIAGNOSIS — K529 Noninfective gastroenteritis and colitis, unspecified: Secondary | ICD-10-CM

## 2023-06-09 DIAGNOSIS — K5190 Ulcerative colitis, unspecified, without complications: Secondary | ICD-10-CM | POA: Diagnosis not present

## 2023-06-09 DIAGNOSIS — K5730 Diverticulosis of large intestine without perforation or abscess without bleeding: Secondary | ICD-10-CM

## 2023-06-09 DIAGNOSIS — D122 Benign neoplasm of ascending colon: Secondary | ICD-10-CM | POA: Diagnosis not present

## 2023-06-09 DIAGNOSIS — Z860100 Personal history of colon polyps, unspecified: Secondary | ICD-10-CM | POA: Diagnosis not present

## 2023-06-09 DIAGNOSIS — D123 Benign neoplasm of transverse colon: Secondary | ICD-10-CM | POA: Diagnosis not present

## 2023-06-09 DIAGNOSIS — K648 Other hemorrhoids: Secondary | ICD-10-CM | POA: Diagnosis not present

## 2023-06-09 DIAGNOSIS — K633 Ulcer of intestine: Secondary | ICD-10-CM | POA: Diagnosis not present

## 2023-06-09 MED ORDER — SODIUM CHLORIDE 0.9 % IV SOLN: 500.0000 mL | Freq: Once | INTRAVENOUS | Status: DC

## 2023-06-09 NOTE — Progress Notes (Signed)
Pt's states no medical or surgical changes since previsit or office visit. 

## 2023-06-09 NOTE — Op Note (Signed)
[facility] Patient Name: Crystal [name] Procedure Date: 06/09/2023 1:14 PM MRN: [id number] Endoscopist: Viviann Spare P. Adela Lank , MD, [id number] Age: 72 Referring MD:  Date of Birth: 09/20/1950 Gender: Female Account #: [id number] Procedure:                Colonoscopy Indications:              Disease activity assessment of Crohn's disease of                            the small bowel, history of colon polyps - on                            [name] newly started this year Medicines:                Monitored Anesthesia Care Procedure:                Pre-Anesthesia Assessment:                           - Prior to the procedure, a History and Physical                            was performed, and patient medications and                            allergies were reviewed. The patient's tolerance of                            previous anesthesia was also reviewed. The risks                            and benefits of the procedure and the sedation                            options and risks were discussed with the patient.                            All questions were answered, and informed consent                            was obtained. Prior Anticoagulants: The patient has                            taken no anticoagulant or antiplatelet agents. ASA                            Grade Assessment: III - A patient with severe                            systemic disease. After reviewing the risks and                            benefits, the patient was deemed in satisfactory  condition to undergo the procedure.                           After obtaining informed consent, the colonoscope                            was passed under direct vision. Throughout the                            procedure, the patient's blood pressure, pulse, and                            oxygen saturations were monitored continuously. The                            [id number]-[id number] Slim  SN [id number] was introduced through                            the anus and advanced to the the terminal ileum,                            with identification of the appendiceal orifice and                            IC valve. The colonoscopy was performed without                            difficulty. The patient tolerated the procedure                            well. The quality of the bowel preparation was                            adequate. The terminal ileum, ileocecal valve,                            appendiceal orifice, and rectum were photographed. Scope In: 1:34:33 PM Scope Out: 1:53:01 PM Scope Withdrawal Time: 0 hours 14 minutes 58 seconds  Total Procedure Duration: 0 hours 18 minutes 28 seconds  Findings:                 The perianal and digital rectal examinations were                            normal.                           The terminal ileum contained two semi-pedunculated                            polyps. They both appeared inflammatory with                            erosions. Biopsies were taken with a cold forceps  for histology.                           Patchy mild inflammation characterized by a few                            small erosions was found in the terminal ileum.                           A 3 mm polyp was found in the ileocecal valve. The                            polyp was flat. The polyp was removed with a cold                            snare. Resection and retrieval were complete.                           A 6 mm polyp was found in the ascending colon. The                            polyp was sessile. The polyp was removed with a                            cold snare. Resection and retrieval were complete.                           Multiple small-mouthed diverticula were found in                            the transverse colon and left colon.                           Internal hemorrhoids were found during  retroflexion.                           The exam was otherwise without abnormality. Complications:            No immediate complications. Estimated blood loss:                            Minimal. Estimated Blood Loss:     Estimated blood loss was minimal. Impression:               - Two polyps in the terminal ileum. Biopsied.                           - Mild inflammation was found in the ileum                            secondary to ileitis.                           - One 3 mm polyp at the ileocecal valve, removed  with a cold snare. Resected and retrieved.                           - One 6 mm polyp in the ascending colon, removed                            with a cold snare. Resected and retrieved.                           - Diverticulosis in the transverse colon and in the                            left colon.                           - Internal hemorrhoids.                           - The examination was otherwise normal.                           Overall, no significant inflammation noted -                            appears mild. Previously had fecal calprotectin >                            1000 on prior regimen. Recommendation:           - Patient has a contact number available for                            emergencies. The signs and symptoms of potential                            delayed complications were discussed with the                            patient. Return to normal activities tomorrow.                            Written discharge instructions were provided to the                            patient.                           - Resume previous diet.                           - Continue present medications.                           - Await pathology results.                           - Please go to  the lab for fecal calprotectin as                            previously recommended. May consider increase in                             [name] dose if it remains elevated. Otherwise,                            mild patchy inflammation noted and                            pseudoinflammatory polyps. Viviann Spare P. Shirlette Scarber, MD 06/09/2023 2:02:22 PM This report has been signed electronically.

## 2023-06-09 NOTE — Progress Notes (Signed)
Called to room to assist during endoscopic procedure.  Patient ID and intended procedure confirmed with present staff. Received instructions for my participation in the procedure from the performing physician.  

## 2023-06-09 NOTE — Patient Instructions (Signed)
-   Resume previous diet - Continue present medications. - Await pathology results - Please go to the lab for fecal calprotectin as previously recommended.  - May consider increase in [name] dose if it remains elevated.  Otherwise, mild patchy inflammation noted and pseudoinflammatory polyps.   YOU HAD AN ENDOSCOPIC PROCEDURE TODAY AT THE [facility]:   Refer to the procedure report that was given to you for any specific questions about what was found during the examination.  If the procedure report does not answer your questions, please call your gastroenterologist to clarify.  If you requested that your care partner not be given the details of your procedure findings, then the procedure report has been included in a sealed envelope for you to review at your convenience later.  YOU SHOULD EXPECT: Some feelings of bloating in the abdomen. Passage of more gas than usual.  Walking can help get rid of the air that was put into your GI tract during the procedure and reduce the bloating. If you had a lower endoscopy (such as a colonoscopy or flexible sigmoidoscopy) you may notice spotting of blood in your stool or on the toilet paper. If you underwent a bowel prep for your procedure, you may not have a normal bowel movement for a few days.  Please Note:  You might notice some irritation and congestion in your nose or some drainage.  This is from the oxygen used during your procedure.  There is no need for concern and it should clear up in a day or so.  SYMPTOMS TO REPORT IMMEDIATELY:  Following lower endoscopy (colonoscopy or flexible sigmoidoscopy):  Excessive amounts of blood in the stool  Significant tenderness or worsening of abdominal pains  Swelling of the abdomen that is new, acute  Fever of 100F or higher  For urgent or emergent issues, a gastroenterologist can be reached at any hour by calling [phone]. Do not use MyChart messaging for urgent concerns.    DIET:  We do  recommend a small meal at first, but then you may proceed to your regular diet.  Drink plenty of fluids but you should avoid alcoholic beverages for 24 hours.  ACTIVITY:  You should plan to take it easy for the rest of today and you should NOT DRIVE or use heavy machinery until tomorrow (because of the sedation medicines used during the test).    FOLLOW UP: Our staff will call the number listed on your records the next business day following your procedure.  We will call around 7:15- 8:00 am to check on you and address any questions or concerns that you may have regarding the information given to you following your procedure. If we do not reach you, we will leave a message.     If any biopsies were taken you will be contacted by phone or by letter within the next 1-3 weeks.  Please call us at [phone] if you have not heard about the biopsies in 3 weeks.    SIGNATURES/CONFIDENTIALITY: You and/or your care partner have signed paperwork which will be entered into your electronic medical record.  These signatures attest to the fact that that the information above on your After Visit Summary has been reviewed and is understood.  Full responsibility of the confidentiality of this discharge information lies with you and/or your care-partner.

## 2023-06-09 NOTE — Progress Notes (Signed)
History and Physical Interval Note: on [name] for her Crohn's disease, here for surveillance colonoscopy for that and history of colon polyps. No interval changes since we have seen her. She wishes to proceed. She otherwise feels well today without complaints.    06/09/2023 1:28 PM  Crystal [name]  has presented today for endoscopic procedure(s), with the diagnosis of  Encounter Diagnoses  Name Primary?   Crohn's disease of small intestine with other complication (HCC) Yes   History of colon polyps   .  The various methods of evaluation and treatment have been discussed with the patient and/or family. After consideration of risks, benefits and other options for treatment, the patient has consented to  the endoscopic procedure(s).   The patient's history has been reviewed, patient examined, no change in status, stable for surgery.  I have reviewed the patient's chart and labs.  Questions were answered to the patient's satisfaction.    Harlin Rain, MD [facility]

## 2023-06-09 NOTE — Progress Notes (Signed)
Sedate, gd SR, tolerated procedure well, VSS, report to RN 

## 2023-06-10 ENCOUNTER — Telehealth: Payer: Self-pay

## 2023-06-10 NOTE — Telephone Encounter (Signed)
Left message on answering machine. 

## 2023-06-12 ENCOUNTER — Encounter: Payer: Medicare PPO | Admitting: Gastroenterology

## 2023-06-12 LAB — SURGICAL PATHOLOGY

## 2023-07-09 ENCOUNTER — Telehealth: Payer: Self-pay | Admitting: Gastroenterology

## 2023-07-09 NOTE — Telephone Encounter (Signed)
 Patient called stated she has to do a stool sample but does not have any instructions.

## 2023-07-09 NOTE — Telephone Encounter (Signed)
 Called and left message for patient that  the lab will give her instructions on how to collect the sample when she gets the kit

## 2023-07-11 ENCOUNTER — Other Ambulatory Visit: Payer: Self-pay | Admitting: Nurse Practitioner

## 2023-07-11 DIAGNOSIS — Z79899 Other long term (current) drug therapy: Secondary | ICD-10-CM

## 2023-07-14 ENCOUNTER — Ambulatory Visit (INDEPENDENT_AMBULATORY_CARE_PROVIDER_SITE_OTHER): Payer: 59 | Admitting: Primary Care

## 2023-07-14 ENCOUNTER — Encounter: Payer: Self-pay | Admitting: Internal Medicine

## 2023-07-14 ENCOUNTER — Other Ambulatory Visit: Payer: Self-pay | Admitting: Internal Medicine

## 2023-07-14 ENCOUNTER — Encounter: Payer: Self-pay | Admitting: Primary Care

## 2023-07-14 VITALS — BP 128/84 | HR 81 | Temp 96.8°F | Ht 63.0 in | Wt 157.6 lb

## 2023-07-14 DIAGNOSIS — G4733 Obstructive sleep apnea (adult) (pediatric): Secondary | ICD-10-CM | POA: Diagnosis not present

## 2023-07-14 DIAGNOSIS — J479 Bronchiectasis, uncomplicated: Secondary | ICD-10-CM | POA: Diagnosis not present

## 2023-07-14 NOTE — Patient Instructions (Addendum)
 Sleep apnea is well controlled with Inspire, continue to use nightly / and with naps   -OBSTRUCTIVE SLEEP APNEA (OSA): Obstructive Sleep Apnea is a condition where the airway becomes blocked during sleep, causing breathing to stop and start repeatedly. You are using the Inspire device, which has improved your sleep quality and reduced snoring. We discussed the possibility of a REM sleep disorder and potential treatments like clonazepam  or melatonin if the symptoms become disruptive. Continue using your Inspire device nightly as directed.  -PREOPERATIVE ASSESSMENT: You are preparing for left shoulder surgery and have been assessed as having an intermediate risk for prolonged mechanical ventilation due to your history of bronchiectasis and obstructive sleep apnea. It is recommended that your surgery be performed in a hospital setting. Please remember to bring your Inspire remote to the surgery.  -UNEXPLAINED BACK PAIN: You have been experiencing persistent back pain since a previous COVID-19 infection. This pain radiates across your body and is exacerbated by breathing. It is recommended that you follow up with your primary care provider or consider a CT scan to better assess your lung and back area. Take mucinex  or Robitussin for cough as needed.   Follow-up 1 year with Beth NP or sooner if having issues (watchpat sleep study prior)

## 2023-07-14 NOTE — Progress Notes (Signed)
 @Patient  ID: Crystal [name], female    DOB: 1951-01-06, 73 y.o.   MRN: [id number]  No chief complaint on file.   Referring provider: Tonita Fallow, MD  HPI: 73 year old female, never smoked.  Past medical history significant for hypertension, aortic arthrosclerosis, OSA, bronchiectasis without complication, allergic rhinitis, Crohn's, GERD, chronic kidney disease stage II, arthropathy involving shoulder region, COVID-19 virus, recurrent TIAs, right total knee arthroplasty, lumbar spine stenosis, obesity.  Previous [facility] pulmonary encounter:  05/05/2023 Former patient of Dr. Shellia.  Followed by our office for obstructive sleep apnea, allergic rhinitis and bronchiectasis without complication. Patient was intolerant to CPAP and has inspire device (hypoglossal nerve stimulator).    Discussed the use of AI scribe software for clinical note transcription with the patient, who gave verbal consent to proceed.  History of Present Illness   Patient presents today for surgical risk assessment for left should surgery revision, tentatively scheduled for 1 January. She has a history of sleep apnea managed with an Inspire device, allergic rhinitis, and bronchiectasis.   She is tolerating inspire well.  She is 80% compliant with usage greater than 4 hours over the last 30 days.  Average pauses per night 0.0.  Current amplitude 2.0 voltage (level 4).  She had COVID-19 in August which has left her feeling fatigued. She also reports increased difficulty breathing, which she attributes to allergies or sensitivities. She has been taking an allergy  pill, possibly Allegra , and using Flonase  once daily, but reports these measures are not providing sufficient relief.  She reports that she stays stocked up, suggesting nasal congestion. The patient has a history of receiving allergy  shots and has a deviated septum from frequent falls.  The patient's fatigue is not attributed to sleep apnea, as her  Inspire device download shows no pauses in breathing. The patient is on level four of the Inspire device, which is just below the highest level.   The patient is not on oxygen  or blood thinners and has no history of blood clots.    07/14/2023- interim hx  Patient presents today for OSA/Inspire follow-up.   Discussed the use of AI scribe software for clinical note transcription with the patient, who gave verbal consent to proceed.  History of Present Illness   Hx sleep apnea, intolerant to CPAP. Patient uses Inspire device consistently. She is 80% compliant with usage greater than 4 hours over the last 30 days (06/10/23-07/09/23).  Average usage 8 hours 15 mins. Average pauses per night 0.0. Current amplitude 2.0 voltage (level 5).  She reports sporadic insomnia, occurring approximately once or twice a month, which she attributes to an overactive mind. She denies any correlation between these episodes and her Inspire device.  Despite these occasional sleep disturbances, the patient expresses confidence in her Inspire device, noting improved sleep quality and reduced nocturnal awakenings. However, she reports an increase in sleep talking, which does not disrupt her own sleep but does affect her partner.  The patient also describes an incident where she fell and hit her head during a daytime nap, during which the Inspire device was not in use. She recalls acting out her dreams, including making movements in the air, which she realizes upon waking were not interacting with anything tangible.  The patient also mentions an upcoming surgery for a shoulder condition. She expresses concern about a persistent discomfort in her back, which started during a bout of COVID-19 before Labor Day and has not completely resolved. The discomfort is located in the back and radiates  across the body, and is exacerbated by breathing. The patient is unsure whether this discomfort is due to a muscle pull, a kidney issue, or  another cause. She has no significant cough.   The patient is currently using her Inspire device at a level five, one below her highest possible setting. She reports that this level is comfortable, but notes that her tongue is constantly moving and does not settle down. She also mentions that she often wakes up in the morning with the device already off, and sometimes forgets to pause it when she wakes up in the middle of the night.      Allergies  Allergen Reactions   Methocarbamol  Shortness Of Breath, Nausea Only and Other (See Comments)    Other reaction(s): Dizziness, psychological reaction, disoriented Sleepy and weight loss    Codeine Other (See Comments)    Headache   Statins Other (See Comments)    Extreme pain   Azathioprine    Other Other (See Comments)    MINT=sore mouth and stomach upset   Oxycodone      Dizziness, unbalanced.   Amitriptyline Other (See Comments)    Confusion and hallucinations   Erythromycin Base Rash    Flushing/red face.   Fish Oil Nausea Only   Flax Seed [Flax Seed Oil] Rash   Flaxseed (Linseed) Rash   Hydrocodone  Itching and Rash        Morphine Nausea And Vomiting and Rash   Nsaids Nausea Only   Nuvigil [Armodafinil] Other (See Comments)    Unspecified   Sertraline Rash   Sinemet [Carbidopa W-Levodopa] Rash   Sulfa Antibiotics Rash   Tolmetin Nausea Only    Immunization History  Administered Date(s) Administered   DT (Pediatric) 01/03/2015   Fluad Quad(high Dose 65+) 04/25/2022, 05/01/2023   Fluad Trivalent(High Dose 65+) 02/19/2018   Influenza Split 03/30/2010, 03/31/2011, 05/14/2014, 02/27/2016, 04/16/2018   Influenza Whole 03/30/2010   Influenza, High Dose Seasonal PF 03/13/2015, 07/21/2019, 02/16/2020, 04/08/2021   Influenza-Unspecified 03/31/2011, 05/14/2014, 02/27/2016, 04/16/2018   PFIZER Comirnaty(Gray Top)Covid-19 Tri-Sucrose Vaccine 09/19/2019, 10/10/2019, 07/13/2020, 05/01/2023   PFIZER(Purple Top)SARS-COV-2 Vaccination  09/19/2019, 10/10/2019   Pfizer Covid-19 Vaccine Bivalent Booster 29yrs & up 05/14/2021   Pneumococcal Conjugate-13 06/30/2005, 04/17/2015, 01/23/2019   Pneumococcal Polysaccharide-23 01/06/2017   Pneumococcal-Unspecified 06/30/2005   Respiratory Syncytial Virus Vaccine,Recomb Aduvanted(Arexvy) 07/01/2022   Td 06/30/2004   Td (Adult),5 Lf Tetanus Toxid, Preservative Free 06/30/2004   Td,absorbed, Preservative Free, Adult Use, Lf Unspecified 06/30/2004   Unspecified SARS-COV-2 Vaccination 04/25/2022   Zoster Recombinant(Shingrix) 05/14/2021, 08/22/2021   Zoster, Live 07/01/2007    Past Medical History:  Diagnosis Date   Arthritis    ASCVD (arteriosclerotic cardiovascular disease)    Closed nondisplaced subtrochanteric fracture of left femur (HCC) 09/03/2017   September 2018.  Status post surgery by Dr. Celena   Crohn's disease of ileum without complication (HCC)    DDD (degenerative disc disease), lumbar    Depression    Dyspnea    Eye infection    Fibromyalgia    Gait disorder 10/26/2012   GERD (gastroesophageal reflux disease)    GI bleed    Hyperlipidemia    Hypertension    Hypothyroid    Periprosthetic supracondylar fracture of femur, left  03/23/2017   Pneumonia    Pre-diabetes    Restless leg syndrome    Rhinitis 06/25/2010   Sleep apnea    does not use CPAP   Spinal headache    with the C section   TIA (transient  ischemic attack)    3         Vitamin D  deficiency     Tobacco History: Social History   Tobacco Use  Smoking Status Never   Passive exposure: Past  Smokeless Tobacco Never   Counseling given: Not Answered   Outpatient Medications Prior to Visit  Medication Sig Dispense Refill   acyclovir  (ZOVIRAX ) 400 MG tablet TAKE 1 TABLET BY MOUTH EVERY DAY 90 tablet 1   bisoprolol -hydrochlorothiazide  (ZIAC ) 10-6.25 MG tablet TAKE 1 TABLET BY MOUTH EVERY DAY 90 tablet 2   Black Pepper-Turmeric (TURMERIC PLUS BLACK PEPPER EXT PO) Take by mouth.      buPROPion  (WELLBUTRIN  XL) 300 MG 24 hr tablet Take  1 tablet  Daily  for Mood, Focus & Concentration                                                          /                                                                   TAKE                                         BY                                                 MOUTH 90 tablet 3   carboxymethylcellulose (REFRESH PLUS) 0.5 % SOLN Place 1 drop into both eyes 3 (three) times daily as needed (dry eyes).     CINNAMON PO Take by mouth.     clobetasol ointment (TEMOVATE) 0.05 % Apply 1 Application topically 3 (three) times daily as needed (lichen planus).     dexamethasone  (DECADRON ) 0.5 MG/5ML solution TAKE 5 MLS (0.5 MG TOTAL) BY MOUTH 1 of 2 TIMES DAILY as needed     diclofenac  Sodium (VOLTAREN ) 1 % GEL Apply 2 g topically daily as needed (pain). 4 g 1   DULoxetine  (CYMBALTA ) 60 MG capsule TAKE 1 CAPSULE EVERY DAY FOR CHRONIC PAIN 90 capsule 3   fenofibrate  micronized (LOFIBRA) 134 MG capsule TAKE 1 CAPSULE EVERY DAY BEFORE BREAKFAST 90 capsule 3   Ferrous Sulfate (IRON PO) Take 25 mg by mouth daily.     fluconazole  (DIFLUCAN ) 100 MG tablet Take 100 mg by mouth daily.     fluticasone  (FLONASE ) 50 MCG/ACT nasal spray Use  1-2 Sprays  each nostril   1-2 x /day  as needed 16 g 5   gabapentin  (NEURONTIN ) 300 MG capsule Take  1 capsule  3 x /day  or Chronic Pain    (Dx: m79.7)                                               /  TAKE                                   BY                                  MOUTH     levothyroxine  (SYNTHROID ) 88 MCG tablet TAKE 1 TAB DAILY ON AN EMPTY STOMACH WITH WATER  X 30 MIN. NO ANTACID, CALCIUM , MAGNESIUM  X 4 HRS AND AVOID BIOTIN 90 tablet 3   meclizine  (ANTIVERT ) 25 MG tablet TAKE 1 TABLET (25 MG TOTAL) BY MOUTH 3 (THREE) TIMES DAILY AS NEEDED FOR DIZZINESS. 90 tablet 0   montelukast  (SINGULAIR ) 10 MG tablet Take 1 tablet (10 mg total) by mouth at bedtime. 30 tablet 5   mupirocin ointment  (BACTROBAN) 2 % Apply 1 Application topically 3 (three) times daily as needed (lichen planus).     neomycin -polymyxin-hydrocortisone (CORTISPORIN) OTIC solution Place 3 drops into both ears daily as needed (ear inflammation).     nystatin  cream (MYCOSTATIN ) Apply 1 Application topically 2 (two) times daily. 30 g 0   omeprazole  (PRILOSEC) 40 MG capsule TAKE 1 CAPSULE EVERY DAY 90 capsule 3   OVER THE COUNTER MEDICATION Place 1 drop under the tongue daily. D3 5000 units - K2 120 mcg     oxybutynin  (DITROPAN ) 5 MG tablet Take 1 tablet 2 x /day fr Excessive Sweating 180 tablet 3   polyethylene glycol (MIRALAX  / GLYCOLAX ) 17 g packet Take 17-51 g by mouth 3 (three) times daily. Take 3 doses daily     Rectal Protectant-Emollient (CALMOL-4) 76-10 % SUPP Use as directed once to twice daily     risankizumab -rzaa ([name] ) 600 MG/10ML injection Inject 10 mLs into the vein every 8 (eight) weeks.     rOPINIRole  (REQUIP ) 2 MG tablet TAKE 1 TABLET TWICE DAILY AND TAKE 2 TABLETS AT BEDTIME FOR RESTLESS LEGS 360 tablet 3   tiZANidine  (ZANAFLEX ) 2 MG tablet TAKE 1 TABLET AT BEDTIME FOR MUSCLE SPASM 90 tablet 3   traMADol  (ULTRAM ) 50 MG tablet Take 50 mg by mouth every 6 (six) hours as needed for moderate pain or severe pain.     tretinoin (RETIN-A) 0.1 % cream Apply a pea-sized amount to your entire face. Start using this every other night, then increase to nightly as tolerated. You can mix this with a gentle moisturizer (Cerave, Cetaphil) if your skin becomes too dry.     Vitamin D -Vitamin K (K2-D3 5000 PO) Take 5,000 Units by mouth daily. (Patient not taking: Reported on 06/09/2023)     No facility-administered medications prior to visit.   Review of Systems  Review of Systems  Constitutional: Negative.   HENT: Negative.    Respiratory: Negative.    Cardiovascular: Negative.   Musculoskeletal:  Positive for back pain.   Physical Exam  There were no vitals taken for this visit. Physical  Exam Constitutional:      Appearance: Normal appearance.  HENT:     Head: Normocephalic and atraumatic.     Mouth/Throat:     Mouth: Mucous membranes are moist.     Pharynx: Oropharynx is clear.  Cardiovascular:     Rate and Rhythm: Normal rate and regular rhythm.  Pulmonary:     Effort: Pulmonary effort is normal.     Breath sounds: Normal breath sounds.     Comments: CTA Musculoskeletal:  Cervical back: Normal range of motion.  Skin:    General: Skin is warm and dry.  Neurological:     General: No focal deficit present.     Mental Status: She is alert and oriented to person, place, and time. Mental status is at baseline.  Psychiatric:        Mood and Affect: Mood normal.        Behavior: Behavior normal.        Thought Content: Thought content normal.        Judgment: Judgment normal.      Lab Results:  CBC    Component Value Date/Time   WBC 6.9 05/18/2023 1528   RBC 5.25 (H) 05/18/2023 1528   HGB 16.3 (H) 05/18/2023 1528   HGB 12.9 10/20/2012 1358   HCT 49.1 (H) 05/18/2023 1528   HCT 40.2 10/20/2012 1358   PLT 307 05/18/2023 1528   PLT 265 10/20/2012 1358   MCV 93.5 05/18/2023 1528   MCV 82.1 10/20/2012 1358   MCH 31.0 05/18/2023 1528   MCHC 33.2 05/18/2023 1528   RDW 12.7 05/18/2023 1528   RDW 16.9 (H) 10/20/2012 1358   LYMPHSABS 1,327 03/11/2023 1539   LYMPHSABS 1.3 10/20/2012 1358   MONOABS 0.7 01/19/2021 2316   MONOABS 0.6 10/20/2012 1358   EOSABS 173 05/18/2023 1528   EOSABS 0.1 10/20/2012 1358   BASOSABS 104 05/18/2023 1528   BASOSABS 0.1 10/20/2012 1358    BMET    Component Value Date/Time   NA 143 05/18/2023 1528   NA 142 03/28/2017 0000   NA 141 10/20/2012 1358   K 4.3 05/18/2023 1528   K 3.9 10/20/2012 1358   CL 101 05/18/2023 1528   CL 104 10/20/2012 1358   CO2 33 (H) 05/18/2023 1528   CO2 27 10/20/2012 1358   GLUCOSE 104 (H) 05/18/2023 1528   GLUCOSE 109 (H) 10/20/2012 1358   BUN 20 05/18/2023 1528   BUN 14 03/28/2017 0000    BUN 15.6 10/20/2012 1358   CREATININE 1.03 (H) 05/18/2023 1528   CREATININE 1.1 10/20/2012 1358   CALCIUM  10.4 05/18/2023 1528   CALCIUM  7.8 (L) 03/26/2017 0354   CALCIUM  9.7 10/20/2012 1358   GFRNONAA >60 01/10/2022 1551   GFRNONAA 80 07/04/2020 1457   GFRAA 93 07/04/2020 1457    BNP    Component Value Date/Time   BNP 32.3 01/19/2021 2316    ProBNP No results found for: PROBNP  Imaging: No results found.   Assessment & Plan:   1. OSA (obstructive sleep apnea) (Primary) - Home sleep test; Future  2. Bronchiectasis without complication (HCC)      Obstructive Sleep Apnea (OSA) Patient is using Inspire device with noted improvement in sleep quality and reduced snoring. Current amplitude 2.0 V (level 5). Average pause per night is 0.0. She reports occasional sleep talking. Discussed possible REM sleep disorder and potential treatment options, she will monitor symptoms and notify us  if they become disruptive. -Continue use of Inspire device nightly 4-6 hours or longer/ and with naps  -Plan for a follow-up sleep study in December 2025 to assess efficacy of Inspire device.     Preoperative Assessment Patient is planning for left shoulder surgery,currently on hold due to dental issues. Previous assessment deemed patient intermediate risk for prolonged mechanical ventilation due to history of bronchiectasis and sleep apnea. -Recommend surgery be performed in a hospital setting. -Advise patient to bring Inspire remote to surgery.  Unexplained Back Pain Patient reports persistent back pain since  a previous COVID-19 infection. Pain is located to left mid-lower back and radiates across the body. -Recommend follow-up with primary care provider or consider a CT scan to better assess lung and back area.  Allergic Rhinitis -Continue Flonase  daily, singulair  and OTC antihistamine  -Resume use of saline nasal rinse 1-2 times daily   Bronchiectasis without complication  - No active  cough - Continue mucinex  600mg  1-2 time a day prn  - CXR 05/05/23 showed no acute chest findings, clear lungs. If back pain does not resolved recommend getting HRCT imaging of chest to re-evaluate bronchiectasis   Almarie LELON Ferrari, NP 07/14/2023

## 2023-07-22 ENCOUNTER — Ambulatory Visit: Payer: 59 | Admitting: Nurse Practitioner

## 2023-07-30 ENCOUNTER — Other Ambulatory Visit: Payer: 59

## 2023-07-30 DIAGNOSIS — K5909 Other constipation: Secondary | ICD-10-CM

## 2023-07-30 DIAGNOSIS — L439 Lichen planus, unspecified: Secondary | ICD-10-CM

## 2023-07-30 DIAGNOSIS — Z79899 Other long term (current) drug therapy: Secondary | ICD-10-CM

## 2023-07-30 DIAGNOSIS — K649 Unspecified hemorrhoids: Secondary | ICD-10-CM

## 2023-07-30 DIAGNOSIS — K50018 Crohn's disease of small intestine with other complication: Secondary | ICD-10-CM

## 2023-07-31 ENCOUNTER — Ambulatory Visit (INDEPENDENT_AMBULATORY_CARE_PROVIDER_SITE_OTHER): Payer: PPO | Admitting: Nurse Practitioner

## 2023-07-31 ENCOUNTER — Other Ambulatory Visit (HOSPITAL_COMMUNITY): Payer: Self-pay

## 2023-07-31 VITALS — BP 130/80 | HR 61 | Temp 98.0°F | Resp 16 | Ht 63.0 in | Wt 158.6 lb

## 2023-07-31 DIAGNOSIS — J069 Acute upper respiratory infection, unspecified: Secondary | ICD-10-CM

## 2023-07-31 DIAGNOSIS — R0981 Nasal congestion: Secondary | ICD-10-CM | POA: Diagnosis not present

## 2023-07-31 DIAGNOSIS — R051 Acute cough: Secondary | ICD-10-CM | POA: Diagnosis not present

## 2023-07-31 NOTE — Progress Notes (Unsigned)
Assessment and Plan:  Crystal [name] was seen today for an episodic visit.  Diagnoses and all order for this visit:  1. Upper respiratory tract infection, unspecified type (Primary) Start tmt with abx - take in full and as directed to reduce abx resistance. Stay well hydrated to keep mucus thin and productive  - amoxicillin (AMOXIL) 500 MG tablet; Take 1 tablet (500 mg total) by mouth in the morning, at noon, and at bedtime for 7 days.  Dispense: 21 tablet; Refill: 0  2. Acute cough Stay well hydrated to keep any mucus thin and productive. Coughing can be cuased by several factors including   breathing in things that bother (irritate) your lungs.  Allergies.  Asthma.  Mucus that runs down the back of your throat (postnasal drip).  Smoking/smoke.  Acid backing up from the stomach into the tube that moves food from the mouth to the stomach (gastroesophageal reflux). A cough can linger for 3 weeks. Watch for any changes in your cough and contact office if noticed including blood, pus, pain, night sweats. Cover your mouth when you cough. If the air is dry, use a cool mist vaporizer or humidifier in your home. If your cough is worse at night, try using extra pillows to raise your head up higher while you sleep. Call 911 or report to ER if you start to have difficulty breathing.    3. Nasal congestion Continue Flonase, Montelukast   Notify office for further evaluation and treatment, questions or concerns if s/s fail to improve. The risks and benefits of my recommendations, as well as other treatment options were discussed with the patient today. Questions were answered.  Further disposition pending results of labs. Discussed med's effects and SE's.    Over 20 minutes of exam, counseling, chart review, and critical decision making was performed.   Future Appointments  Date Time Provider Department Center  07/31/2023 10:00 AM Adela Glimpse, NP [facility] None   08/07/2023 10:30 AM Raynelle Dick, NP [facility] None  08/26/2023  2:30 PM Adela Glimpse, NP [facility] None  11/24/2023  2:30 PM [name], MD [facility] None  02/24/2024  2:30 PM Adela Glimpse, NP [facility] None  06/07/2024  2:00 PM [name], MD [facility] None  07/19/2024 11:00 AM Glenford Bayley, NP [facility] None    ------------------------------------------------------------------------------------------------------------------   HPI BP 130/80   Pulse 61   Temp 98 F (36.7 C)   Resp 16   Ht 5\' 3"  (1.6 m)   Wt 158 lb 9.6 oz (71.9 kg)   SpO2 98%   BMI 28.09 kg/m    Patient complains of symptoms of a URI, possible sinusitis. Symptoms include congestion, cough described as waxing and waning over time, nasal congestion, sinus pressure, and sneezing. Onset of symptoms was 5 days ago, and has been unchanged since that time. Treatment to date: antihistamines and decongestants. Denies fever, chills.     Past Medical History:  Diagnosis Date   Arthritis    ASCVD (arteriosclerotic cardiovascular disease)    Closed nondisplaced subtrochanteric fracture of left femur (HCC) 09/03/2017   September 2018.  Status post surgery by Dr. Carola Frost   Crohn's disease of ileum without complication [facility])    DDD (degenerative disc disease), lumbar    Depression    Dyspnea    Eye infection    Fibromyalgia    Gait disorder 10/26/2012   GERD (gastroesophageal reflux disease)    GI bleed    Hyperlipidemia    Hypertension  Hypothyroid    Periprosthetic supracondylar fracture of femur, left  03/23/2017   Pneumonia    Pre-diabetes    Restless leg syndrome    Rhinitis 06/25/2010   Sleep apnea    does not use CPAP   Spinal headache    with the C section   TIA (transient ischemic attack)    3         Vitamin D deficiency      Allergies  Allergen Reactions   Methocarbamol Shortness Of Breath, Nausea Only and Other (See Comments)    Other reaction(s): Dizziness,  psychological reaction, disoriented Sleepy and weight loss    Codeine Other (See Comments)    Headache   Statins Other (See Comments)    Extreme pain   Azathioprine    Other Other (See Comments)    MINT=sore mouth and stomach upset   Oxycodone     Dizziness, unbalanced.   Amitriptyline Other (See Comments)    Confusion and hallucinations   Erythromycin Base Rash    Flushing/red face.   Fish Oil Nausea Only   Flax Seed [Flax Seed Oil] Rash   Flaxseed (Linseed) Rash   Hydrocodone Itching and Rash        Morphine Nausea And Vomiting and Rash   Nsaids Nausea Only   Nuvigil [Armodafinil] Other (See Comments)    Unspecified   Sertraline Rash   Sinemet [Carbidopa W-Levodopa] Rash   Sulfa Antibiotics Rash   Tolmetin Nausea Only    Current Outpatient Medications on File Prior to Visit  Medication Sig   acyclovir (ZOVIRAX) 400 MG tablet TAKE 1 TABLET BY MOUTH EVERY DAY   bisoprolol-hydrochlorothiazide (ZIAC) 10-6.25 MG tablet TAKE 1 TABLET BY MOUTH EVERY DAY   Black Pepper-Turmeric (TURMERIC PLUS BLACK PEPPER EXT PO) Take by mouth.   buPROPion (WELLBUTRIN XL) 300 MG 24 hr tablet Take  1 tablet  Daily  for Mood, Focus & Concentration                                                          /                                                                   TAKE                                         BY                                                 MOUTH   CINNAMON PO Take by mouth.   clobetasol ointment (TEMOVATE) 0.05 % Apply 1 Application topically 3 (three) times daily as needed (lichen planus).   diclofenac Sodium (VOLTAREN) 1 % GEL Apply 2 g topically daily as needed (pain).   DULoxetine (CYMBALTA) 60 MG capsule TAKE 1 CAPSULE EVERY DAY FOR  CHRONIC PAIN   fenofibrate micronized (LOFIBRA) 134 MG capsule TAKE 1 CAPSULE EVERY DAY BEFORE BREAKFAST   Ferrous Sulfate (IRON PO) Take 25 mg by mouth daily.   fluconazole (DIFLUCAN) 100 MG tablet Take 100 mg by mouth daily.    fluticasone (FLONASE) 50 MCG/ACT nasal spray Use  1-2 Sprays  each nostril   1-2 x /day  as needed   gabapentin (NEURONTIN) 300 MG capsule Take  1 capsule  3 x /day  or Chronic Pain    (Dx: m79.7)                                               /                                TAKE                                   BY                                  MOUTH   levothyroxine (SYNTHROID) 88 MCG tablet TAKE 1 TAB DAILY ON AN EMPTY STOMACH WITH WATER X 30 MIN. NO ANTACID, CALCIUM, MAGNESIUM X 4 HRS AND AVOID BIOTIN   meclizine (ANTIVERT) 25 MG tablet TAKE 1 TABLET (25 MG TOTAL) BY MOUTH 3 (THREE) TIMES DAILY AS NEEDED FOR DIZZINESS.   montelukast (SINGULAIR) 10 MG tablet Take 1 tablet (10 mg total) by mouth at bedtime.   mupirocin ointment (BACTROBAN) 2 % Apply 1 Application topically 3 (three) times daily as needed (lichen planus).   neomycin-polymyxin-hydrocortisone (CORTISPORIN) OTIC solution Place 3 drops into both ears daily as needed (ear inflammation).   nystatin cream (MYCOSTATIN) Apply 1 Application topically 2 (two) times daily.   omeprazole (PRILOSEC) 40 MG capsule TAKE 1 CAPSULE EVERY DAY   OVER THE COUNTER MEDICATION Place 1 drop under the tongue daily. D3 5000 units - K2 120 mcg   oxybutynin (DITROPAN) 5 MG tablet Take 1 tablet 2 x /day fr Excessive Sweating   polyethylene glycol (MIRALAX / GLYCOLAX) 17 g packet Take 17-51 g by mouth 3 (three) times daily. Take 3 doses daily   Rectal Protectant-Emollient (CALMOL-4) 76-10 % SUPP Use as directed once to twice daily   risankizumab-rzaa ([name]) 600 MG/10ML injection Inject 10 mLs into the vein every 8 (eight) weeks.   rOPINIRole (REQUIP) 2 MG tablet TAKE 1 TABLET TWICE DAILY AND TAKE 2 TABLETS AT BEDTIME FOR RESTLESS LEGS   tiZANidine (ZANAFLEX) 2 MG tablet TAKE 1 TABLET AT BEDTIME FOR MUSCLE SPASM   traMADol (ULTRAM) 50 MG tablet Take 50 mg by mouth every 6 (six) hours as needed for moderate pain or severe pain.   tretinoin (RETIN-A) 0.1 %  cream Apply a pea-sized amount to your entire face. Start using this every other night, then increase to nightly as tolerated. You can mix this with a gentle moisturizer (Cerave, Cetaphil) if your skin becomes too dry.   Vitamin D-Vitamin K (K2-D3 5000 PO) Take 5,000 Units by mouth daily.   carboxymethylcellulose (REFRESH PLUS) 0.5 % SOLN Place 1 drop into both eyes 3 (three) times daily as needed (dry eyes).  dexamethasone (DECADRON) 0.5 MG/5ML solution TAKE 5 MLS (0.5 MG TOTAL) BY MOUTH 1 of 2 TIMES DAILY as needed (Patient not taking: Reported on 07/31/2023)   No current facility-administered medications on file prior to visit.    ROS: all negative except what is noted in the HPI.   Physical Exam:  BP 130/80   Pulse 61   Temp 98 F (36.7 C)   Resp 16   Ht 5\' 3"  (1.6 m)   Wt 158 lb 9.6 oz (71.9 kg)   SpO2 98%   BMI 28.09 kg/m   General Appearance: NAD.  Awake, conversant and cooperative. Eyes: PERRLA, EOMs intact.  Sclera white.  Conjunctiva without erythema. Sinuses: Frontal/maxillary tenderness.  No nasal discharge. Nares patent.  ENT/Mouth: Ext aud canals clear.  Bilateral TMs w/DOL and without erythema or bulging. Hearing intact.  Posterior pharynx without swelling or exudate.  Tonsils without swelling or erythema.  Neck: Supple.  No masses, nodules or thyromegaly. Respiratory: Effort is regular with non-labored breathing. Breath sounds are equal bilaterally without rales, rhonchi, wheezing or stridor.  Cardio: RRR with no MRGs. Brisk peripheral pulses without edema.  Abdomen: Active BS in all four quadrants.  Soft and non-tender without guarding, rebound tenderness, hernias or masses. Lymphatics: Non tender without lymphadenopathy.  Musculoskeletal: Full ROM, 5/5 strength, normal ambulation.  No clubbing or cyanosis. Skin: Appropriate color for ethnicity. Warm without rashes, lesions, ecchymosis, ulcers.  Neuro: CN II-XII grossly normal. Normal muscle tone without cerebellar  symptoms and intact sensation.   Psych: AO X 3,  appropriate mood and affect, insight and judgment.     Adela Glimpse, NP 9:55 AM [facility]

## 2023-08-01 LAB — CALPROTECTIN, FECAL: Calprotectin, Fecal: 559 ug/g — ABNORMAL HIGH (ref 0–120)

## 2023-08-02 ENCOUNTER — Encounter: Payer: Self-pay | Admitting: Nurse Practitioner

## 2023-08-02 MED ORDER — AMOXICILLIN 500 MG PO TABS: 500.0000 mg | ORAL_TABLET | Freq: Three times a day (TID) | ORAL | 0 refills | Status: AC

## 2023-08-03 ENCOUNTER — Other Ambulatory Visit: Payer: Self-pay

## 2023-08-03 ENCOUNTER — Telehealth: Payer: Self-pay

## 2023-08-03 ENCOUNTER — Encounter: Payer: Self-pay | Admitting: Gastroenterology

## 2023-08-03 NOTE — Telephone Encounter (Signed)
Given her elevated fecal calprotectin I think she may benefit from increasing the dose to [name] 360 mg every 8 weeks.

## 2023-08-05 MED ORDER — SKYRIZI 360 MG/2.4ML ~~LOC~~ SOCT: 1.0000 | SUBCUTANEOUS | 5 refills | Status: DC

## 2023-08-05 NOTE — Telephone Encounter (Signed)
 Called and spoke with patient to inquire about her last injection (07/22/23). This information was needed to complete the updated prescription form with [name]  complete. Order has been updated to reflect [name]  360 mg eery 8 weeks, pt knows that we will update her once we have a response from [name]  complete. Pt has been advised that she will be due for a follow up with Dr. Leigh in June, he will likely order repeat fecal calprotectin at that time. Pt verbalized understanding and had no concerns at the end of the call.   [name]  prescription form faxed to [name]  complete at [phone]

## 2023-08-06 ENCOUNTER — Other Ambulatory Visit (HOSPITAL_COMMUNITY): Payer: Self-pay

## 2023-08-06 DIAGNOSIS — R82998 Other abnormal findings in urine: Secondary | ICD-10-CM | POA: Diagnosis not present

## 2023-08-06 DIAGNOSIS — R300 Dysuria: Secondary | ICD-10-CM | POA: Diagnosis not present

## 2023-08-07 ENCOUNTER — Other Ambulatory Visit: Payer: Self-pay

## 2023-08-07 ENCOUNTER — Other Ambulatory Visit (HOSPITAL_COMMUNITY): Payer: Self-pay

## 2023-08-07 ENCOUNTER — Ambulatory Visit: Payer: 59 | Admitting: Nurse Practitioner

## 2023-08-07 MED ORDER — FENOFIBRATE 134 MG PO CAPS
134.0000 mg | ORAL_CAPSULE | Freq: Every day | ORAL | 3 refills | Status: DC
  Filled 2023-08-08: qty 90, 90d supply, fill #0

## 2023-08-07 MED ORDER — DULOXETINE HCL 60 MG PO CPEP
60.0000 mg | ORAL_CAPSULE | Freq: Every day | ORAL | 3 refills | Status: AC
  Filled 2023-08-11: qty 90, 90d supply, fill #0
  Filled 2023-12-18: qty 90, 90d supply, fill #1
  Filled 2024-01-05 – 2024-02-24 (×3): qty 90, 90d supply, fill #2

## 2023-08-07 MED ORDER — ROPINIROLE HCL 2 MG PO TABS
ORAL_TABLET | ORAL | 3 refills | Status: AC
  Filled 2023-08-11: qty 360, 90d supply, fill #0
  Filled 2023-12-18: qty 360, 90d supply, fill #1
  Filled 2024-01-05 – 2024-02-24 (×3): qty 360, 90d supply, fill #2

## 2023-08-07 MED ORDER — TACROLIMUS 0.1 % EX OINT
TOPICAL_OINTMENT | Freq: Two times a day (BID) | CUTANEOUS | 0 refills | Status: DC
  Filled 2023-08-07: qty 100, 30d supply, fill #0

## 2023-08-07 MED ORDER — BUPROPION HCL ER (XL) 300 MG PO TB24
300.0000 mg | ORAL_TABLET | Freq: Every day | ORAL | 3 refills | Status: DC
  Filled 2023-08-11: qty 90, 90d supply, fill #0
  Filled 2023-11-18 – 2024-01-05 (×5): qty 90, 90d supply, fill #1

## 2023-08-07 MED ORDER — OMEPRAZOLE 40 MG PO CPDR
40.0000 mg | DELAYED_RELEASE_CAPSULE | Freq: Every day | ORAL | 3 refills | Status: DC
  Filled 2023-08-11: qty 90, 90d supply, fill #0

## 2023-08-07 MED ORDER — CLOTRIMAZOLE 10 MG MT TROC
10.0000 mg | Freq: Three times a day (TID) | OROMUCOSAL | 11 refills | Status: AC
Start: 2022-10-07 — End: ?
  Filled 2023-08-08 – 2023-08-25 (×2): qty 140, 47d supply, fill #0

## 2023-08-07 MED ORDER — LEVOTHYROXINE SODIUM 88 MCG PO TABS
88.0000 ug | ORAL_TABLET | Freq: Every day | ORAL | 3 refills | Status: DC
  Filled 2023-08-11: qty 90, 90d supply, fill #0
  Filled 2023-08-25 – 2023-12-18 (×2): qty 90, 90d supply, fill #1
  Filled 2024-02-19 – 2024-02-24 (×2): qty 90, 90d supply, fill #2

## 2023-08-07 MED ORDER — TIZANIDINE HCL 2 MG PO CAPS
2.0000 mg | ORAL_CAPSULE | Freq: Every day | ORAL | 3 refills | Status: DC
  Filled 2023-08-08: qty 30, 30d supply, fill #0
  Filled 2023-08-11 – 2023-09-30 (×3): qty 30, 30d supply, fill #1

## 2023-08-08 ENCOUNTER — Other Ambulatory Visit (HOSPITAL_COMMUNITY): Payer: Self-pay

## 2023-08-10 ENCOUNTER — Other Ambulatory Visit: Payer: Self-pay

## 2023-08-10 ENCOUNTER — Other Ambulatory Visit (HOSPITAL_COMMUNITY): Payer: Self-pay

## 2023-08-10 MED ORDER — DEXAMETHASONE 0.5 MG/5ML PO SOLN
0.5000 mg | Freq: Three times a day (TID) | ORAL | 1 refills | Status: AC
  Filled 2023-08-11: qty 100, 7d supply, fill #0

## 2023-08-10 MED ORDER — DOXYCYCLINE HYCLATE 100 MG PO CAPS
100.0000 mg | ORAL_CAPSULE | Freq: Two times a day (BID) | ORAL | 0 refills | Status: AC
Start: 2023-02-19 — End: 2023-08-11

## 2023-08-10 MED ORDER — EZETIMIBE 10 MG PO TABS
10.0000 mg | ORAL_TABLET | Freq: Every day | ORAL | 11 refills | Status: DC
Start: 2022-08-28 — End: 2023-10-08

## 2023-08-10 MED ORDER — NA SULFATE-K SULFATE-MG SULF 17.5-3.13-1.6 GM/177ML PO SOLN: 1.0000 | ORAL | 0 refills | Status: DC

## 2023-08-10 MED ORDER — AMOXICILLIN 500 MG PO TABS
2000.0000 mg | ORAL_TABLET | Freq: Once | ORAL | 0 refills | Status: AC
Start: 2023-08-04 — End: 2023-08-12
  Filled 2023-08-10: qty 4, 1d supply, fill #0

## 2023-08-11 ENCOUNTER — Other Ambulatory Visit (HOSPITAL_COMMUNITY): Payer: Self-pay

## 2023-08-11 ENCOUNTER — Other Ambulatory Visit: Payer: Self-pay

## 2023-08-12 ENCOUNTER — Other Ambulatory Visit (HOSPITAL_COMMUNITY): Payer: Self-pay

## 2023-08-12 ENCOUNTER — Other Ambulatory Visit: Payer: Self-pay

## 2023-08-13 ENCOUNTER — Other Ambulatory Visit: Payer: Self-pay

## 2023-08-17 ENCOUNTER — Other Ambulatory Visit (HOSPITAL_BASED_OUTPATIENT_CLINIC_OR_DEPARTMENT_OTHER): Payer: Self-pay

## 2023-08-17 ENCOUNTER — Encounter: Payer: Self-pay | Admitting: Gastroenterology

## 2023-08-17 ENCOUNTER — Telehealth: Payer: Self-pay | Admitting: Gastroenterology

## 2023-08-17 NOTE — Telephone Encounter (Signed)
[facility] pharmacy called stating PA is not needed, only prescription to be sent over to them for [facility] medication [phone]

## 2023-08-17 NOTE — Telephone Encounter (Signed)
Please initiate PA for [name] 360 mg every 8 weeks. Patient was previously enrolled in [employer] assistance for 180 mg dose, they do not offer the increased dose. Crohn's disease of small intestine with other complication (K50.018)

## 2023-08-17 NOTE — Telephone Encounter (Signed)
Inbound call from patient stating [facility] does not have higher dosage for [employer]. Requesting for prescription to be sent. Please advise, thank you.

## 2023-08-18 ENCOUNTER — Other Ambulatory Visit (HOSPITAL_COMMUNITY): Payer: Self-pay

## 2023-08-18 ENCOUNTER — Ambulatory Visit: Payer: 59 | Admitting: Urgent Care

## 2023-08-18 MED ORDER — SKYRIZI 360 MG/2.4ML ~~LOC~~ SOCT
1.0000 | SUBCUTANEOUS | 5 refills | Status: DC
Start: 2023-08-18 — End: 2024-05-19

## 2023-08-18 NOTE — Telephone Encounter (Signed)
[name] 360 mg every 8 weeks sent to [facility]. Called and left patient a detailed vm letting her know that I received her message and RX has been sent to [facility] order pharmacy. MyChart message sent to patient with pharmacy information.

## 2023-08-18 NOTE — Telephone Encounter (Signed)
See 2/17 telephone encounter for additional details.

## 2023-08-19 ENCOUNTER — Ambulatory Visit: Payer: 59 | Admitting: Urgent Care

## 2023-08-20 ENCOUNTER — Other Ambulatory Visit (HOSPITAL_BASED_OUTPATIENT_CLINIC_OR_DEPARTMENT_OTHER): Payer: Self-pay

## 2023-08-21 ENCOUNTER — Other Ambulatory Visit (HOSPITAL_COMMUNITY): Payer: Self-pay

## 2023-08-21 ENCOUNTER — Ambulatory Visit: Payer: 59 | Admitting: Urgent Care

## 2023-08-24 ENCOUNTER — Encounter: Payer: Self-pay | Admitting: Urgent Care

## 2023-08-24 ENCOUNTER — Ambulatory Visit (INDEPENDENT_AMBULATORY_CARE_PROVIDER_SITE_OTHER): Payer: PPO | Admitting: Urgent Care

## 2023-08-24 ENCOUNTER — Other Ambulatory Visit (HOSPITAL_BASED_OUTPATIENT_CLINIC_OR_DEPARTMENT_OTHER): Payer: Self-pay

## 2023-08-24 VITALS — BP 140/96 | HR 85 | Resp 100 | Ht 63.0 in | Wt 159.8 lb

## 2023-08-24 DIAGNOSIS — B9620 Unspecified Escherichia coli [E. coli] as the cause of diseases classified elsewhere: Secondary | ICD-10-CM

## 2023-08-24 DIAGNOSIS — N3000 Acute cystitis without hematuria: Secondary | ICD-10-CM | POA: Diagnosis not present

## 2023-08-24 DIAGNOSIS — N390 Urinary tract infection, site not specified: Secondary | ICD-10-CM

## 2023-08-24 LAB — POCT URINALYSIS DIPSTICK
Bilirubin, UA: NEGATIVE
Blood, UA: NEGATIVE
Glucose, UA: NEGATIVE
Ketones, UA: NEGATIVE
Nitrite, UA: NEGATIVE
Protein, UA: NEGATIVE
Spec Grav, UA: >=1.03 — AB (ref 1.010–1.025)
Urobilinogen, UA: 0.2 U/dL
pH, UA: 6 (ref 5.0–8.0)

## 2023-08-24 NOTE — Progress Notes (Signed)
 New Patient Office Visit  Subjective:  Patient ID: Crystal [name], female    DOB: Dec 05, 1950  Age: 73 y.o. MRN: [id number]  CC:  Chief Complaint  Patient presents with   Acute Visit    New pt she has not decided if she will est care here yet. She states she was supposed to have a urine sample to see if E coli has been cured.     HPI Crystal [name] presents for recheck of urine. She does not wish to establish care at this time.   Discussed the use of AI scribe software for clinical note transcription with the patient, who gave verbal consent to proceed.  History of Present Illness   Crystal [name] "Crystal Bible" is a 73 year old female with Crohn's disease and fibromyalgia who presents for a follow-up on a urinary tract infection.  On August 06, 2023, she experienced bladder pain without burning during urination while taking amoxicillin for a sore throat. A urine dipstick test appeared normal, but a urine culture was sent for confirmation. She was prescribed Pyridium for symptom relief, which she found intolerable. Three days later, the urine culture showed positive growth for E. coli, and she was prescribed nitrofurantoin twice daily for seven days. She completed the course of antibiotics with no leftover pills. She is uncertain about current symptoms due to her Crohn's disease, which causes frequent bathroom visits and a smaller bladder capacity. No recurrent burning, stinging, hesitancy, or cloudiness in urine. No fever, chills, or flank pain.  She has Crohn's disease, which contributes to frequent bathroom use and a smaller bladder capacity. She is uncertain if her current symptoms are related to her Crohn's disease or a urinary tract infection. She also notes interstitial cystitis.  She has fibromyalgia, which contributes to her back pain. However, she notes that her back pain has improved recently.  She has chronic kidney disease stage 3, but no  changes from baseline.  She is preparing for reverse ball shoulder surgery and requires clearance to ensure no bacterial infection is present. She has no history of recurrent urinary tract infections, with the last occurrence being a long time ago. She is requesting urine culture results to be faxed to [facility].          Outpatient Encounter Medications as of 08/24/2023  Medication Sig   acyclovir (ZOVIRAX) 400 MG tablet TAKE 1 TABLET BY MOUTH EVERY DAY   Black Pepper-Turmeric (TURMERIC PLUS BLACK PEPPER EXT PO) Take by mouth.   buPROPion (WELLBUTRIN XL) 300 MG 24 hr tablet Take  1 tablet  Daily  for Mood, Focus & Concentration                                                          /                                                                   TAKE  BY                                                 MOUTH   buPROPion (WELLBUTRIN XL) 300 MG 24 hr tablet Take 1 tablet (300 mg total) by mouth daily for mood, focus & concentration.   carboxymethylcellulose (REFRESH PLUS) 0.5 % SOLN Place 1 drop into both eyes 3 (three) times daily as needed (dry eyes).   CINNAMON PO Take by mouth.   clobetasol ointment (TEMOVATE) 0.05 % Apply 1 Application topically 3 (three) times daily as needed (lichen planus).   clotrimazole (MYCELEX) 10 MG troche Dissolve 1 tablet (10 mg total) in mouth 3 (three) times daily.   diclofenac Sodium (VOLTAREN) 1 % GEL Apply 2 g topically daily as needed (pain).   DULoxetine (CYMBALTA) 60 MG capsule TAKE 1 CAPSULE EVERY DAY FOR CHRONIC PAIN   DULoxetine (CYMBALTA) 60 MG capsule Take 1 capsule (60 mg total) by mouth daily for chronic pain.   fenofibrate micronized (LOFIBRA) 134 MG capsule TAKE 1 CAPSULE EVERY DAY BEFORE BREAKFAST   fenofibrate micronized (LOFIBRA) 134 MG capsule Take 1 capsule (134 mg total) by mouth daily before breakfast   Ferrous Sulfate (IRON PO) Take 25 mg by mouth daily.   fluconazole (DIFLUCAN)  100 MG tablet Take 100 mg by mouth daily.   fluticasone (FLONASE) 50 MCG/ACT nasal spray Use  1-2 Sprays  each nostril   1-2 x /day  as needed   gabapentin (NEURONTIN) 300 MG capsule Take  1 capsule  3 x /day  or Chronic Pain    (Dx: m79.7)                                               /                                TAKE                                   BY                                  MOUTH   levothyroxine (SYNTHROID) 88 MCG tablet TAKE 1 TAB DAILY ON AN EMPTY STOMACH WITH WATER X 30 MIN. NO ANTACID, CALCIUM, MAGNESIUM X 4 HRS AND AVOID BIOTIN   levothyroxine (SYNTHROID) 88 MCG tablet Take 1 tablet (88 mcg total) by mouth daily on an empty stomach with water for 30 minutes.  No antacids, calcium, magnesium for 4 hours. Avoid biotin.   meclizine (ANTIVERT) 25 MG tablet TAKE 1 TABLET (25 MG TOTAL) BY MOUTH 3 (THREE) TIMES DAILY AS NEEDED FOR DIZZINESS.   montelukast (SINGULAIR) 10 MG tablet Take 1 tablet (10 mg total) by mouth at bedtime.   mupirocin ointment (BACTROBAN) 2 % Apply 1 Application topically 3 (three) times daily as needed (lichen planus).   neomycin-polymyxin-hydrocortisone (CORTISPORIN) OTIC solution Place 3 drops into both ears daily as needed (ear inflammation).   nystatin cream (MYCOSTATIN) Apply 1 Application topically 2 (two) times daily.  omeprazole (PRILOSEC) 40 MG capsule TAKE 1 CAPSULE EVERY DAY   omeprazole (PRILOSEC) 40 MG capsule Take 1 capsule (40 mg total) by mouth daily.   OVER THE COUNTER MEDICATION Place 1 drop under the tongue daily. D3 5000 units - K2 120 mcg   oxybutynin (DITROPAN) 5 MG tablet Take 1 tablet 2 x /day fr Excessive Sweating   polyethylene glycol (MIRALAX / GLYCOLAX) 17 g packet Take 17-51 g by mouth 3 (three) times daily. Take 3 doses daily   Rectal Protectant-Emollient (CALMOL-4) 76-10 % SUPP Use as directed once to twice daily   Risankizumab-rzaa ([name]) 360 MG/2.4ML SOCT Inject 1 Cartridge into the skin every 8 (eight) weeks.   rOPINIRole  (REQUIP) 2 MG tablet TAKE 1 TABLET TWICE DAILY AND TAKE 2 TABLETS AT BEDTIME FOR RESTLESS LEGS   rOPINIRole (REQUIP) 2 MG tablet Take 1 tablet (2 mg total) by mouth 2 (two) times daily AND 2 tablets (4 mg total) at bedtime for restless legs   tacrolimus (PROTOPIC) 0.1 % ointment Apply to under arms 2 (two) times daily.   tizanidine (ZANAFLEX) 2 MG capsule Take 1 capsule (2 mg total) by mouth at bedtime for muscle spasm.   tiZANidine (ZANAFLEX) 2 MG tablet TAKE 1 TABLET AT BEDTIME FOR MUSCLE SPASM   traMADol (ULTRAM) 50 MG tablet Take 50 mg by mouth every 6 (six) hours as needed for moderate pain or severe pain.   Vitamin D-Vitamin K (K2-D3 5000 PO) Take 5,000 Units by mouth daily.   bisoprolol-hydrochlorothiazide (ZIAC) 10-6.25 MG tablet TAKE 1 TABLET BY MOUTH EVERY DAY (Patient not taking: Reported on 08/24/2023)   dexamethasone (DECADRON) 0.5 MG/5ML solution TAKE 5 MLS (0.5 MG TOTAL) BY MOUTH 1 of 2 TIMES DAILY as needed (Patient not taking: Reported on 08/24/2023)   dexamethasone (DECADRON) 0.5 MG/5ML solution Take 5 mLs (0.5 mg total) by mouth 3 (three) times daily. (Patient not taking: Reported on 08/24/2023)   ezetimibe (ZETIA) 10 MG tablet Take 1 tablet (10 mg total) by mouth daily.   Na Sulfate-K Sulfate-Mg Sulfate concentrate 17.5-3.13-1.6 GM/177ML SOLN Use as directed.   tretinoin (RETIN-A) 0.1 % cream Apply a pea-sized amount to your entire face. Start using this every other night, then increase to nightly as tolerated. You can mix this with a gentle moisturizer (Cerave, Cetaphil) if your skin becomes too dry.   No facility-administered encounter medications on file as of 08/24/2023.    Past Medical History:  Diagnosis Date   Allergy 1966   Anxiety 2024   Nerve pain   Arthritis    ASCVD (arteriosclerotic cardiovascular disease)    Blood transfusion without reported diagnosis    Hospital-surgery   Cataract 2000's   Surgery   Chronic kidney disease 2020's   [facility] long hospital    Closed nondisplaced subtrochanteric fracture of left femur [facility]) 09/03/2017   September 2018.  Status post surgery by Dr. Carola Frost   Crohn's disease of ileum without complication (HCC)    DDD (degenerative disc disease), lumbar    Depression    Dyspnea    Eye infection    Fibromyalgia    Gait disorder 10/26/2012   GERD (gastroesophageal reflux disease)    GI bleed    Glaucoma 2000's   Stable   Hyperlipidemia    Hypertension    Hypothyroid    Periprosthetic supracondylar fracture of femur, left  03/23/2017   Pneumonia    Pre-diabetes    Restless leg syndrome    Rhinitis 06/25/2010   Seizures (HCC) ?  TIA   Sleep apnea    does not use CPAP   Spinal headache    with the C section   TIA (transient ischemic attack)    3         Ulcer    One   Vitamin D deficiency     Past Surgical History:  Procedure Laterality Date   ABDOMINAL HYSTERECTOMY  1992   ANTERIOR CERVICAL DECOMPRESSION/DISCECTOMY FUSION 4 LEVELS N/A 06/14/2015   Procedure: Cervical three-four Cervical four-five Cervical five- six Cervical six- seven  Anterior cervical decompression/diskectomy/fusion;  Surgeon: Maeola Harman, MD;  Location: [facility];  Service: Neurosurgery;  Laterality: N/A;  C3-4 C4-5 C5-6 C6-7 Anterior cervical decompression/diskectomy/fusion   APPENDECTOMY  1960   BACK SURGERY     CATARACT EXTRACTION, BILATERAL     CESAREAN SECTION  1983 & 1886   DRUG INDUCED ENDOSCOPY N/A 02/25/2022   Procedure: DRUG INDUCED SLEEP ENDOSCOPY;  Surgeon: Osborn Coho, MD;  Location: [facility];  Service: ENT;  Laterality: N/A;   EYE SURGERY     bilateral cataracts   FINGER ARTHROSCOPY     FRACTURE SURGERY  2020   Femur   IMPLANTATION OF HYPOGLOSSAL NERVE STIMULATOR Right 05/29/2022   Procedure: IMPLANTATION OF HYPOGLOSSAL NERVE STIMULATOR;  Surgeon: Osborn Coho, MD;  Location: [facility];  Service: ENT;  Laterality: Right;   JOINT REPLACEMENT     2   LEFT  1 RIGHT    KNEE SURGERY      ORIF FEMUR FRACTURE Left 03/24/2017   Procedure: OPEN REDUCTION INTERNAL FIXATION (ORIF) FEMUR FRACTURE;  Surgeon: Myrene Galas, MD;  Location: [facility] OR;  Service: Orthopedics;  Laterality: Left;   ROTATOR CUFF REPAIR Left 2005   2 LEFT  1  RIGHT   SHOULDER ADHESION RELEASE     SPINAL CORD DECOMPRESSION     SPINE SURGERY     TOE SURGERY     LEFT        TOE SURGERY     RIGHT  BIG TOE   TONSILLECTOMY  1960   TRANSFORAMINAL LUMBAR INTERBODY FUSION W/ MIS 1 LEVEL Left 09/05/2021   Procedure: MINIMALLY INVASIVE TRANSFORAMINAL LUMBAR INTERBODY FUSION LUMBAR THREE-FOUR; EXPLORE FUSION WITH REVISION OF HARDWARE LUMBAR FOUR-FIVE,LEFT SIDE APPROACH;  Surgeon: Bethann Goo, DO;  Location: [facility];  Service: Neurosurgery;  Laterality: Left;    Family History  Problem Relation Age of Onset   Cancer Mother    Diabetes Mother    Osteoporosis Mother    Breast cancer Mother    Arthritis Mother    Miscarriages / [place] Mother    Obesity Mother    Heart disease Father    Hypertension Father    Hyperlipidemia Father    Neuropathy Father    Stroke Father    Leukemia Sister    Lupus Sister    Early death Sister    Miscarriages / [place] Sister    Depression Sister    Rheum arthritis Sister    Cancer Sister    Miscarriages / [place] Sister    Migraines Daughter    Cancer Daughter    Cancer Son    Early death Son    Liver disease Neg Hx    Colon cancer Neg Hx    Esophageal cancer Neg Hx     Social History   Socioeconomic History   Marital status: Married    Spouse name: Not on file   Number of children: 2   Years of education: Not  on file   Highest education level: Bachelor's degree (e.g., BA, AB, BS)  Occupational History   Occupation: Retired Advertising account executive: DISABLED  Tobacco Use   Smoking status: Never    Passive exposure: Past   Smokeless tobacco: Never  Vaping Use   Vaping status: Never Used  Substance and Sexual Activity   Alcohol use: Yes     Alcohol/week: 4.0 standard drinks of alcohol    Types: 2 Cans of beer, 2 Standard drinks or equivalent per week    Comment: On and off   Drug use: No   Sexual activity: Not Currently    Birth control/protection: None    Comment: Hysterectomy  Other Topics Concern   Not on file  Social History Narrative   Drinks about 1 cup of coffee a day, occasional coke zero    Social Drivers of Corporate investment banker Strain: Low Risk  (08/23/2023)   Overall Financial Resource Strain (CARDIA)    Difficulty of Paying Living Expenses: Not hard at all  Food Insecurity: No Food Insecurity (08/23/2023)   Hunger Vital Sign    Worried About Running Out of Food in the Last Year: Never true    Ran Out of Food in the Last Year: Never true  Transportation Needs: No Transportation Needs (08/23/2023)   PRAPARE - Transportation    Lack of Transportation (Medical): No    Lack of Transportation (Non-Medical): No  Physical Activity: Unknown (08/23/2023)   Exercise Vital Sign    Days of Exercise per Week: Patient declined    Minutes of Exercise per Session: Not on file  Stress: Stress Concern Present (08/23/2023)   [employer] of Occupational Health - Occupational Stress Questionnaire    Feeling of Stress : To some extent  Social Connections: Unknown (08/23/2023)   Social Connection and Isolation Panel [NHANES]    Frequency of Communication with Friends and Family: More than three times a week    Frequency of Social Gatherings with Friends and Family: Once a week    Attends Religious Services: Patient declined    Database administrator or Organizations: Yes    Attends Engineer, structural: More than 4 times per year    Marital Status: Married  Catering manager Violence: Not on file    ROS: as noted in HPI  Objective:  BP (!) 140/96   Pulse 85   Resp (!) 100   Ht 5\' 3"  (1.6 m)   Wt 159 lb 12.8 oz (72.5 kg)   BMI 28.31 kg/m   Physical Exam Vitals and nursing note reviewed.   Constitutional:      General: She is not in acute distress.    Appearance: Normal appearance. She is not ill-appearing, toxic-appearing or diaphoretic.  HENT:     Head: Normocephalic.  Eyes:     General: No scleral icterus.       Right eye: No discharge.        Left eye: No discharge.  Cardiovascular:     Rate and Rhythm: Normal rate.  Pulmonary:     Effort: Pulmonary effort is normal. No respiratory distress.  Abdominal:     General: Abdomen is flat.     Palpations: Abdomen is soft.     Tenderness: There is no right CVA tenderness, left CVA tenderness, guarding or rebound.  Skin:    General: Skin is warm and dry.     Coloration: Skin is not jaundiced.     Findings: No  bruising, erythema or rash.  Neurological:     Mental Status: She is alert and oriented to person, place, and time.      Lab Results  Component Value Date   COLORU clear 08/24/2023   CLARITYU yellow 08/24/2023   GLUCOSEUR Negative 08/24/2023   BILIRUBINUR negative 08/24/2023   KETONESU negative 08/24/2023   SPECGRAV >=1.030 (A) 08/24/2023   RBCUR negative 08/24/2023   PHUR 6.0 08/24/2023   PROTEINUR Negative 08/24/2023   UROBILINOGEN 0.2 08/24/2023   LEUKOCYTESUR Small (1+) (A) 08/24/2023     Last CBC Lab Results  Component Value Date   WBC 6.9 05/18/2023   HGB 16.3 (H) 05/18/2023   HCT 49.1 (H) 05/18/2023   MCV 93.5 05/18/2023   MCH 31.0 05/18/2023   RDW 12.7 05/18/2023   PLT 307 05/18/2023   Last metabolic panel Lab Results  Component Value Date   GLUCOSE 104 (H) 05/18/2023   NA 143 05/18/2023   K 4.3 05/18/2023   CL 101 05/18/2023   CO2 33 (H) 05/18/2023   BUN 20 05/18/2023   CREATININE 1.03 (H) 05/18/2023   EGFR 57 (L) 05/18/2023   CALCIUM 10.4 05/18/2023   PROT 7.3 05/18/2023   ALBUMIN 4.2 01/19/2021   LABGLOB 2.8 09/26/2019   BILITOT 0.7 05/18/2023   ALKPHOS 58 01/19/2021   AST 20 05/18/2023   ALT 11 05/18/2023   ANIONGAP 12 01/10/2022      Assessment & Plan:  Acute  cystitis without hematuria -     POCT urinalysis dipstick -     Urine Culture  E. coli urinary tract infection -     POCT urinalysis dipstick -     Urine Culture  Assessment and Plan    Urinary Tract Infection Treated with Amoxicillin and then Nitrofurantoin for E. Coli UTI diagnosed on 08/06/2023. No current symptoms of UTI. No history of recurrent UTIs. -Collect urine sample for culture and sensitivity. -Send results to Dr. Delbert Har[facility] at [facility].  Chronic Kidney Disease Stage 3. No change in baseline back pain. -No action required at this time.  Fibromyalgia Chronic back pain, no change from baseline. -No action required at this time.  Crohn's Disease Reports frequent bowel movements. No change from baseline. -No action required at this time.        No follow-ups on file.  Pt does not wish to establish here due to proximity to her home. She will establish with new PCP near [place]. Here today primarily for surgical clearance UA/ cx.  Maretta Bees, PA

## 2023-08-24 NOTE — Patient Instructions (Signed)
 We sent out your urine culture for testing today. We will fax the results to [facility] as requested.

## 2023-08-25 ENCOUNTER — Other Ambulatory Visit (HOSPITAL_COMMUNITY): Payer: Self-pay

## 2023-08-25 ENCOUNTER — Other Ambulatory Visit: Payer: Self-pay | Admitting: Family Medicine

## 2023-08-25 ENCOUNTER — Other Ambulatory Visit: Payer: Self-pay

## 2023-08-25 LAB — URINE CULTURE
MICRO NUMBER:: 16120662
Result:: NO GROWTH
SPECIMEN QUALITY:: ADEQUATE

## 2023-08-26 ENCOUNTER — Other Ambulatory Visit (HOSPITAL_COMMUNITY): Payer: Self-pay

## 2023-08-26 ENCOUNTER — Ambulatory Visit: Payer: 59 | Admitting: Nurse Practitioner

## 2023-08-26 ENCOUNTER — Encounter: Payer: Self-pay | Admitting: Urgent Care

## 2023-09-12 ENCOUNTER — Encounter (HOSPITAL_COMMUNITY): Payer: Self-pay

## 2023-09-14 ENCOUNTER — Other Ambulatory Visit (HOSPITAL_COMMUNITY): Payer: Self-pay

## 2023-09-16 DIAGNOSIS — M7989 Other specified soft tissue disorders: Secondary | ICD-10-CM | POA: Diagnosis not present

## 2023-09-16 DIAGNOSIS — K5090 Crohn's disease, unspecified, without complications: Secondary | ICD-10-CM | POA: Diagnosis not present

## 2023-09-16 DIAGNOSIS — L439 Lichen planus, unspecified: Secondary | ICD-10-CM | POA: Diagnosis not present

## 2023-09-16 DIAGNOSIS — M1990 Unspecified osteoarthritis, unspecified site: Secondary | ICD-10-CM | POA: Diagnosis not present

## 2023-09-16 DIAGNOSIS — M79643 Pain in unspecified hand: Secondary | ICD-10-CM | POA: Diagnosis not present

## 2023-09-16 DIAGNOSIS — M2550 Pain in unspecified joint: Secondary | ICD-10-CM | POA: Diagnosis not present

## 2023-09-16 DIAGNOSIS — M797 Fibromyalgia: Secondary | ICD-10-CM | POA: Diagnosis not present

## 2023-09-22 ENCOUNTER — Telehealth: Payer: Self-pay

## 2023-09-22 NOTE — Telephone Encounter (Signed)
 Copied from CRM [id number]. Topic: General - Billing Inquiry >> Sep 21, 2023 11:36 AM Eunice Blase wrote: Reason for CRM: Received call from pt stated no information was provided to [facility] such as no insurance information or code for lab work. Please call pt at [phone] (M) or [phone] (H).

## 2023-09-22 NOTE — Telephone Encounter (Signed)
 Orders for [facility] are on the order as well as insurance information. If pt has received a bil, it is recommended to contact the billing office from statement to confirm insurance information.

## 2023-09-23 ENCOUNTER — Other Ambulatory Visit: Payer: Self-pay | Admitting: Family Medicine

## 2023-09-23 NOTE — Telephone Encounter (Unsigned)
 Copied from CRM [id number]. Topic: Clinical - Medication Refill >> Sep 23, 2023  2:23 PM [name] wrote: Most Recent Primary Care Visit:  Provider: Maretta Bees  Department: [facility]  Visit Type: NEW PATIENT  Date: 08/24/2023  Medication: fenofibrate micronized (LOFIBRA) 134 MG capsule omeprazole (PRILOSEC) 40 MG capsule  Has the patient contacted their pharmacy? Yes (Agent: If no, request that the patient contact the pharmacy for the refill. If patient does not wish to contact the pharmacy document the reason why and proceed with request.) (Agent: If yes, when and what did the pharmacy advise?)  Is this the correct pharmacy for this prescription? Yes If no, delete pharmacy and type the correct one.  This is the patient's preferred pharmacy:  [id number] [place], [place] - [name] [address] Phone: [phone] Fax: [phone]   Has the prescription been filled recently? No  Is the patient out of the medication? Yes  Has the patient been seen for an appointment in the last year OR does the patient have an upcoming appointment? Yes  Can we respond through MyChart? Yes  Agent: Please be advised that Rx refills may take up to 3 business days. We ask that you follow-up with your pharmacy.

## 2023-09-24 DIAGNOSIS — B370 Candidal stomatitis: Secondary | ICD-10-CM | POA: Diagnosis not present

## 2023-09-24 DIAGNOSIS — L438 Other lichen planus: Secondary | ICD-10-CM | POA: Diagnosis not present

## 2023-09-29 ENCOUNTER — Other Ambulatory Visit (HOSPITAL_COMMUNITY): Payer: Self-pay

## 2023-09-30 ENCOUNTER — Other Ambulatory Visit (HOSPITAL_COMMUNITY): Payer: Self-pay

## 2023-10-03 ENCOUNTER — Other Ambulatory Visit (HOSPITAL_COMMUNITY): Payer: Self-pay

## 2023-10-06 DIAGNOSIS — M25612 Stiffness of left shoulder, not elsewhere classified: Secondary | ICD-10-CM | POA: Diagnosis not present

## 2023-10-06 DIAGNOSIS — M6281 Muscle weakness (generalized): Secondary | ICD-10-CM | POA: Diagnosis not present

## 2023-10-06 DIAGNOSIS — M19012 Primary osteoarthritis, left shoulder: Secondary | ICD-10-CM | POA: Diagnosis not present

## 2023-10-07 NOTE — Progress Notes (Signed)
 COVID Vaccine received:  []  No [x]  Yes Date of any COVID positive Test in last 90 days:  PCP - new PCP as of 10-29-23  Charlyn Minerva, NP at [facility], [place]  clearance 08-24-23 Epic note     Cardiologist - Carolan Clines, MD  Rheumatology- Casimer Lanius, MD GI- Ileene Patrick, MD  Chest x-ray - 05-05-2023  2v  Epic EKG - 05-18-2023  Epic  Stress Test -  ECHO - 02-28-2022  Epic Cardiac Cath -  Zio Monitor- 03-17-2022  Epic  Pacemaker / ICD device [x]  No []  Yes   Spinal Cord Stimulator:[x]  No []  Yes       History of Sleep Apnea? []  No [x]  Yes   CPAP used?- [x]  No []  Yes  INSPIRE   bring remote  Does the patient monitor blood sugar?   []  N/A   [x]  No []  Yes  Patient has: []  NO Hx DM   [x]  Pre-DM   []  DM1  []   DM2 Last A1c was:  6.1 on 05-18-2023      Blood Thinner / Instructions:  none Aspirin Instructions:  none  ERAS Protocol Ordered: [x]  No  []  Yes Patient is to be NPO after: midnight prior  Dental hx: []  Dentures:  [x]  N/A      []  Bridge or Partial:                   []  Loose or Damaged teeth:   Comments: The patient was given Benzoyl peroxide Gel as ordered. Instruction regarding application starting 2 days prior to surgery was given and patient voiced understanding.   Patient was given the 5 CHG shower / bath instructions for Reverse Shoulder arthroplasty surgery along with 2 bottles of the CHG soap. Patient will start this on: 10-17-2023  All questions were asked and answered, Patient voiced understanding of this process.   Activity level: Patient is able to climb a flight of stairs without difficulty; [x]  No CP  but would have SOB.   Patient can perform ADLs without assistance.   Anesthesia review: ASCVD, HTN- no meds, Hx TIAs x 3, Bell's palsy- right side, s/p ACDF C2-C7 (06-14-2015), RLS, OSA- has INSPIRE, Crohns, fibromyalgia, Pre-DM- no meds, CKD3, glaucoma,   Patient has had cold like symptoms, sinus x 2 weeks and a cough x 3 weeks, doesn't have a PCP.  Our  PA, Antionette Poles, came and examined her and discussed the need to see someone because she is currently out of some of her meds. The PCP she saw for clearance is not going to be seeing her long term, she will start with her new PCP on 11-02-23.  Patient verbalized understanding and agreement to the Pre-Surgical Instructions that were given to them at this PAT appointment. Patient was also educated of the need to review these PAT instructions again prior to her surgery.I reviewed the appropriate phone numbers to call if they have any and questions or concerns.

## 2023-10-07 NOTE — Patient Instructions (Signed)
 SURGICAL WAITING ROOM VISITATION Patients having surgery or a procedure may have no more than 2 support people in the waiting area - these visitors may rotate in the visitor waiting room.   If the patient needs to stay at the hospital during part of their recovery, the visitor guidelines for inpatient rooms apply.  PRE-OP VISITATION  Pre-op nurse will coordinate an appropriate time for 1 support person to accompany the patient in pre-op.  This support person may not rotate.  This visitor will be contacted when the time is appropriate for the visitor to come back in the pre-op area.  Please refer to the [facility] website for the visitor guidelines for Inpatients (after your surgery is over and you are in a regular room).  You are not required to quarantine at this time prior to your surgery. However, you must do this: Hand Hygiene often Do NOT share personal items Notify your provider if you are in close contact with someone who has COVID or you develop fever 100.4 or greater, new onset of sneezing, cough, sore throat, shortness of breath or body aches.  If you test positive for Covid or have been in contact with anyone that has tested positive in the last 10 days please notify you surgeon.    Your procedure is scheduled on:  [facility]  October 21, 2023  Report to [facility] Main Entrance: Leota Jacobsen entrance where the [employer] is available.   Report to admitting at: 07:30    AM  Call this number if you have any questions or problems the morning of surgery [phone]  DO NOT EAT OR DRINK ANYTHING AFTER MIDNIGHT THE NIGHT PRIOR TO YOUR SURGERY / PROCEDURE.   FOLLOW  ANY ADDITIONAL PRE OP INSTRUCTIONS YOU RECEIVED FROM YOUR SURGEON'S OFFICE!!!   Oral Hygiene is also important to reduce your risk of infection.        Remember - BRUSH YOUR TEETH THE MORNING OF SURGERY WITH YOUR REGULAR TOOTHPASTE  Do NOT smoke after Midnight the night before surgery.  STOP TAKING all  Vitamins, Herbs and supplements 1 week before your surgery.   Take ONLY these medicines the morning of surgery with A SIP OF WATER: Levothyroxine, gabapentin, fluconazole, and clotrimazole troche,   You may not have any metal on your body including hair pins, jewelry, and body piercing  Do not wear make-up, lotions, powders, perfumes ,or deodorant  Do not wear nail polish including gel and S&S, artificial / acrylic nails, or any other type of covering on natural nails including finger and toenails. If you have artificial nails, gel coating, etc., that needs to be removed by a nail salon, Please have this removed prior to surgery. Not doing so may mean that your surgery could be cancelled or delayed if the Surgeon or anesthesia staff feels like they are unable to monitor you safely.   Do not shave 48 hours prior to surgery to avoid nicks in your skin which may contribute to postoperative infections.    Contacts, Hearing Aids, dentures or bridgework may not be worn into surgery. DENTURES WILL BE REMOVED PRIOR TO SURGERY PLEASE DO NOT APPLY "Poly grip" OR ADHESIVES!!!  Patients discharged on the day of surgery will not be allowed to drive home.  Someone NEEDS to stay with you for the first 24 hours after anesthesia.  Do not bring your home medications to the hospital. The Pharmacy will dispense medications listed on your medication list to you during your admission in the  Hospital.  Please read over the following fact sheets you were given: IF YOU HAVE QUESTIONS ABOUT YOUR PRE-OP INSTRUCTIONS, PLEASE CALL [phone].      Pre-operative 5 CHG Bath Instructions   You can play a key role in reducing the risk of infection after surgery. Your skin needs to be as free of germs as possible. You can reduce the number of germs on your skin by washing with CHG (chlorhexidine gluconate) soap before surgery. CHG is an antiseptic soap that kills germs and continues to kill germs even after washing.    DO NOT use if you have an allergy to chlorhexidine/CHG or antibacterial soaps. If your skin becomes reddened or irritated, stop using the CHG and notify one of our RNs at [phone]  Please shower with the CHG soap starting 4 days before surgery using the following schedule: START SHOWERS ON   SATURDAY October 17, 2023                                                                                                                                                                           Please keep in mind the following:  DO NOT shave, including legs and underarms, starting the day of your first shower.   You may shave your face at any point before/day of surgery.   Place clean sheets on your bed the day you start using CHG soap. Use a clean washcloth (not used since being washed) for each shower. DO NOT sleep with pets once you start using the CHG.   CHG Shower Instructions:  If you choose to wash your hair and private area, wash first with your normal shampoo/soap.  After you use shampoo/soap, rinse your hair and body thoroughly to remove shampoo/soap residue.  Turn the water OFF and apply about 3 tablespoons (45 ml) of CHG soap to a CLEAN washcloth.  Apply CHG soap ONLY FROM YOUR NECK DOWN TO YOUR TOES (washing for 3-5 minutes)  DO NOT use CHG soap on face, private areas, open wounds, or sores.  Pay special attention to the area where your surgery is being performed.  If you are having back surgery, having someone wash your back for you may be helpful.  Wait 2 minutes after CHG soap is applied, then you may rinse off the CHG soap.  Pat dry with a clean towel  Put on clean clothes/pajamas   If you choose to wear lotion, please use ONLY the CHG-compatible lotions on the back of this paper.     Additional instructions for the day of surgery: DO NOT APPLY any lotions, deodorants, cologne, or perfumes.   Put on clean/comfortable clothes.  Brush your teeth.  Ask your nurse before  applying any  prescription medications to the skin.      CHG Compatible Lotions   Aveeno Moisturizing lotion  Cetaphil Moisturizing Cream  Cetaphil Moisturizing Lotion  Clairol Herbal Essence Moisturizing Lotion, Dry Skin  Clairol Herbal Essence Moisturizing Lotion, Extra Dry Skin  Clairol Herbal Essence Moisturizing Lotion, Normal Skin  Curel Age Defying Therapeutic Moisturizing Lotion with Alpha Hydroxy  Curel Extreme Care Body Lotion  Curel Soothing Hands Moisturizing Hand Lotion  Curel Therapeutic Moisturizing Cream, Fragrance-Free  Curel Therapeutic Moisturizing Lotion, Fragrance-Free  Curel Therapeutic Moisturizing Lotion, Original Formula  Eucerin Daily Replenishing Lotion  Eucerin Dry Skin Therapy Plus Alpha Hydroxy Crme  Eucerin Dry Skin Therapy Plus Alpha Hydroxy Lotion  Eucerin Original Crme  Eucerin Original Lotion  Eucerin Plus Crme Eucerin Plus Lotion  Eucerin TriLipid Replenishing Lotion  Keri Anti-Bacterial Hand Lotion  Keri Deep Conditioning Original Lotion Dry Skin Formula Softly Scented  Keri Deep Conditioning Original Lotion, Fragrance Free Sensitive Skin Formula  Keri Lotion Fast Absorbing Fragrance Free Sensitive Skin Formula  Keri Lotion Fast Absorbing Softly Scented Dry Skin Formula  Keri Original Lotion  Keri Skin Renewal Lotion Keri Silky Smooth Lotion  Keri Silky Smooth Sensitive Skin Lotion  Nivea Body Creamy Conditioning Oil  Nivea Body Extra Enriched Lotion  Nivea Body Original Lotion  Nivea Body Sheer Moisturizing Lotion Nivea Crme  Nivea Skin Firming Lotion  NutraDerm 30 Skin Lotion  NutraDerm Skin Lotion  NutraDerm Therapeutic Skin Cream  NutraDerm Therapeutic Skin Lotion  ProShield Protective Hand Cream  Provon moisturizing lotion     Preparing for Total Shoulder Arthroplasty ================================================================= Please follow these instructions carefully, in addition to any other special Bathing  information that was explained to you at the Presurgical Appointment:  BENZOYL PEROXIDE 5% GEL: Used to kill bacteria on the skin which could cause an infection at the surgery site.   Please do not use if you have an allergy to benzoyl peroxide. If your skin becomes reddened/irritated stop using the benzoyl peroxide and inform your Doctor.   Starting two days before surgery, apply as follows:  1. Apply benzoyl peroxide gel in the morning and at night. Apply after taking a shower. If you are not taking a shower, clean entire shoulder front, back, and side, along with the armpit with a clean wet washcloth.  2. Place a quarter-sized dollop of the gel on your SHOULDER and rub in thoroughly, making sure to cover the front, back, and side of your shoulder, along with the armpit.   2 Days prior to Surgery   MONDAY  October 21, 2023 First Application _______ Morning Second Application _______ Night  Day Before Surgery    TUESDAY  October 22, 2023 First Application______ Morning  On the night before surgery, wash your entire body (except hair, face and private areas) with CHG Soap. THEN, rub in the LAST application of the Benzoyl Peroxide Gel on your shoulder.   3. On the Morning of Surgery wash your BODY AGAIN with CHG Soap (except hair, face and private areas)  4. DO NOT USE THE BENZOYL PEROXIDE GEL ON THE DAY OF YOUR SURGERY    FAILURE TO FOLLOW THESE INSTRUCTIONS MAY RESULT IN THE CANCELLATION OF YOUR SURGERY  PATIENT SIGNATURE_________________________________  NURSE SIGNATURE__________________________________  ________________________________________________________________________          Crystal [name]    An incentive spirometer is a tool that can help keep your lungs clear and active. This tool measures how well you are filling your lungs with each breath. Taking long deep  breaths may help reverse or decrease the chance of developing breathing (pulmonary) problems  (especially infection) following: A long period of time when you are unable to move or be active. BEFORE THE PROCEDURE  If the spirometer includes an indicator to show your best effort, your nurse or respiratory therapist will set it to a desired goal. If possible, sit up straight or lean slightly forward. Try not to slouch. Hold the incentive spirometer in an upright position. INSTRUCTIONS FOR USE  Sit on the edge of your bed if possible, or sit up as far as you can in bed or on a chair. Hold the incentive spirometer in an upright position. Breathe out normally. Place the mouthpiece in your mouth and seal your lips tightly around it. Breathe in slowly and as deeply as possible, raising the piston or the ball toward the top of the column. Hold your breath for 3-5 seconds or for as long as possible. Allow the piston or ball to fall to the bottom of the column. Remove the mouthpiece from your mouth and breathe out normally. Rest for a few seconds and repeat Steps 1 through 7 at least 10 times every 1-2 hours when you are awake. Take your time and take a few normal breaths between deep breaths. The spirometer may include an indicator to show your best effort. Use the indicator as a goal to work toward during each repetition. After each set of 10 deep breaths, practice coughing to be sure your lungs are clear. If you have an incision (the cut made at the time of surgery), support your incision when coughing by placing a pillow or rolled up towels firmly against it. Once you are able to get out of bed, walk around indoors and cough well. You may stop using the incentive spirometer when instructed by your caregiver.  RISKS AND COMPLICATIONS Take your time so you do not get dizzy or light-headed. If you are in pain, you may need to take or ask for pain medication before doing incentive spirometry. It is harder to take a deep breath if you are having pain. AFTER USE Rest and breathe slowly and  easily. It can be helpful to keep track of a log of your progress. Your caregiver can provide you with a simple table to help with this. If you are using the spirometer at home, follow these instructions: SEEK MEDICAL CARE IF:  You are having difficultly using the spirometer. You have trouble using the spirometer as often as instructed. Your pain medication is not giving enough relief while using the spirometer. You develop fever of 100.5 F (38.1 C) or higher.                                                                                                    SEEK IMMEDIATE MEDICAL CARE IF:  You cough up bloody sputum that had not been present before. You develop fever of 102 F (38.9 C) or greater. You develop worsening pain at or near the incision site. MAKE SURE YOU:  Understand these instructions. Will watch your condition. Will get  help right away if you are not doing well or get worse. Document Released: 10/27/2006 Document Revised: 09/08/2011 Document Reviewed: 12/28/2006 [facility] Patient Information 2014 [place], [place].       If you would like to see a video about joint replacement:   [email or web address]

## 2023-10-08 ENCOUNTER — Other Ambulatory Visit: Payer: Self-pay | Admitting: Family Medicine

## 2023-10-08 ENCOUNTER — Encounter (HOSPITAL_COMMUNITY): Payer: Self-pay

## 2023-10-08 ENCOUNTER — Other Ambulatory Visit: Payer: Self-pay

## 2023-10-08 ENCOUNTER — Encounter (HOSPITAL_COMMUNITY)
Admission: RE | Admit: 2023-10-08 | Discharge: 2023-10-08 | Disposition: A | Discharge status: Home / Self Care | Source: Ambulatory Visit | Attending: Orthopaedic Surgery | Admitting: Orthopaedic Surgery

## 2023-10-08 VITALS — BP 114/77 | HR 78 | Temp 98.9°F | Resp 18 | Ht 63.0 in | Wt 155.0 lb

## 2023-10-08 DIAGNOSIS — G4733 Obstructive sleep apnea (adult) (pediatric): Secondary | ICD-10-CM | POA: Insufficient documentation

## 2023-10-08 DIAGNOSIS — Z01818 Encounter for other preprocedural examination: Secondary | ICD-10-CM

## 2023-10-08 DIAGNOSIS — I10 Essential (primary) hypertension: Secondary | ICD-10-CM | POA: Diagnosis not present

## 2023-10-08 DIAGNOSIS — Z8673 Personal history of transient ischemic attack (TIA), and cerebral infarction without residual deficits: Secondary | ICD-10-CM | POA: Insufficient documentation

## 2023-10-08 DIAGNOSIS — I340 Nonrheumatic mitral (valve) insufficiency: Secondary | ICD-10-CM | POA: Diagnosis not present

## 2023-10-08 DIAGNOSIS — Z981 Arthrodesis status: Secondary | ICD-10-CM | POA: Diagnosis not present

## 2023-10-08 DIAGNOSIS — Z79899 Other long term (current) drug therapy: Secondary | ICD-10-CM | POA: Diagnosis not present

## 2023-10-08 DIAGNOSIS — M797 Fibromyalgia: Secondary | ICD-10-CM | POA: Diagnosis not present

## 2023-10-08 DIAGNOSIS — J479 Bronchiectasis, uncomplicated: Secondary | ICD-10-CM | POA: Insufficient documentation

## 2023-10-08 DIAGNOSIS — G2581 Restless legs syndrome: Secondary | ICD-10-CM | POA: Insufficient documentation

## 2023-10-08 DIAGNOSIS — Z01812 Encounter for preprocedural laboratory examination: Secondary | ICD-10-CM | POA: Diagnosis present

## 2023-10-08 HISTORY — DX: Cerebral infarction, unspecified: I63.9

## 2023-10-08 HISTORY — DX: Myoneural disorder, unspecified: G70.9

## 2023-10-08 HISTORY — DX: Anemia, unspecified: D64.9

## 2023-10-08 LAB — CBC
HCT: 47.1 % — ABNORMAL HIGH (ref 36.0–46.0)
Hemoglobin: 15.2 g/dL — ABNORMAL HIGH (ref 12.0–15.0)
MCH: 31.2 pg (ref 26.0–34.0)
MCHC: 32.3 g/dL (ref 30.0–36.0)
MCV: 96.7 fL (ref 80.0–100.0)
Platelets: 257 10*3/uL (ref 150–400)
RBC: 4.87 MIL/uL (ref 3.87–5.11)
RDW: 13.2 % (ref 11.5–15.5)
WBC: 6.9 10*3/uL (ref 4.0–10.5)
nRBC: 0 % (ref 0.0–0.2)

## 2023-10-08 LAB — COMPREHENSIVE METABOLIC PANEL WITH GFR
ALT: 17 U/L (ref 0–44)
AST: 25 U/L (ref 15–41)
Albumin: 4.2 g/dL (ref 3.5–5.0)
Alkaline Phosphatase: 40 U/L (ref 38–126)
Anion gap: 8 (ref 5–15)
BUN: 21 mg/dL (ref 8–23)
CO2: 29 mmol/L (ref 22–32)
Calcium: 9.6 mg/dL (ref 8.9–10.3)
Chloride: 101 mmol/L (ref 98–111)
Creatinine, Ser: 0.96 mg/dL (ref 0.44–1.00)
GFR, Estimated: >60 mL/min (ref >60)
Glucose, Bld: 108 mg/dL — ABNORMAL HIGH (ref 70–99)
Potassium: 4.6 mmol/L (ref 3.5–5.1)
Sodium: 138 mmol/L (ref 135–145)
Total Bilirubin: 0.4 mg/dL (ref 0.0–1.2)
Total Protein: 7 g/dL (ref 6.5–8.1)

## 2023-10-08 LAB — SURGICAL PCR SCREEN
MRSA, PCR: NEGATIVE
Staphylococcus aureus: NEGATIVE

## 2023-10-08 NOTE — Telephone Encounter (Signed)
 Copied from CRM [id number]. Topic: Clinical - Medication Refill >> Oct 08, 2023  2:14 PM [place] J wrote: Most Recent Primary Care Visit:  Provider: Maretta Bees  Department: [facility]  Visit Type: NEW PATIENT  Date: 08/24/2023  Medication: Benzonatate (tessalon)  Has the patient contacted their pharmacy? No (Agent: If no, request that the patient contact the pharmacy for the refill. If patient does not wish to contact the pharmacy document the reason why and proceed with request.) (Agent: If yes, when and what did the pharmacy advise?)  Is this the correct pharmacy for this prescription? Yes If no, delete pharmacy and type the correct one.  This is the patient's preferred pharmacy:  [id number] [place], [place] - [name] [address] Phone: [phone] Fax: [phone]  Has the prescription been filled recently? No  Is the patient out of the medication? Yes  Has the patient been seen for an appointment in the last year OR does the patient have an upcoming appointment? Yes  Can we respond through MyChart? Yes  Agent: Please be advised that Rx refills may take up to 3 business days. We ask that you follow-up with your pharmacy.

## 2023-10-09 ENCOUNTER — Telehealth: Payer: Self-pay | Admitting: Family Medicine

## 2023-10-09 ENCOUNTER — Ambulatory Visit: Admitting: Family Medicine

## 2023-10-09 NOTE — Telephone Encounter (Signed)
 Copied from CRM [id number]. Topic: Clinical - Medication Refill >> Oct 09, 2023  2:11 PM Truddie Crumble wrote: Most Recent Primary Care Visit:  Provider: Maretta Bees  Department: [facility]  Visit Type: NEW PATIENT  Date: 08/24/2023  Medication: tessalon pearls   Has the patient contacted their pharmacy? no (Agent: If no, request that the patient contact the pharmacy for the refill. If patient does not wish to contact the pharmacy document the reason why and proceed with request.) (Agent: If yes, when and what did the pharmacy advise?)  Is this the correct pharmacy for this prescription? Yes If no, delete pharmacy and type the correct one.  This is the patient's preferred pharmacy:  [id number] [place], [place] - [name] [address] Phone: [phone] Fax: [phone]  Has the prescription been filled recently? No  Is the patient out of the medication? Yes  Has the patient been seen for an appointment in the last year OR does the patient have an upcoming appointment? Yes  Can we respond through MyChart? Yes  Agent: Please be advised that Rx refills may take up to 3 business days. We ask that you follow-up with your pharmacy.

## 2023-10-09 NOTE — Progress Notes (Signed)
 Anesthesia Chart Review:  Follows with pulmonology for history of history of OSA s/p Inspire implant, and regional bronchiectasis in setting of reflux. Last seen 07/14/2023 by Ames Dura, NP for preop eval.  Per note, "Preoperative Assessment: Patient is planning for left shoulder surgery,currently on hold due to dental issues. Previous assessment deemed patient intermediate risk for prolonged mechanical ventilation due to history of bronchiectasis and sleep apnea. -Recommend surgery be performed in a hospital setting. -Advise patient to bring Inspire remote to surgery."  ASCVD entered in pt history in 05/12/2013. It appears this was entered as a qualifying diagnosis for ordering an echo that was done for eval of recent TIA (history of TIA 2005, 2014, 2016). Echo was normal.    Patient more recently evaluated by cardiology in 2023 for atypical chest pain and palpitations.  Echo 02/2022 showed EF 55 to 60%, normal wall motion, normal RV function, mild mitral regurgitation.  7-day event monitor 02/2022 showed underlying rhythm to be NSR with rare ectopy.   Other pertinent history includes fibromyalgia, CKD 3, RLS, Bell's palsy, limited neck ROM s/p C2-C7 cervical fusion.  Patient reported at preop testing that she had recently run out of [employer] and had noticed increased cough.  She was otherwise asymptomatic, no new shortness of breath.  Well-appearing on exam, no signs or symptoms of illness.  No coughing throughout interview.  Lungs clear to auscultation.  She was recommended follow-up with PCP or pulmonology for refill of Tessalon Perles to use as needed.  Also advised to seek further evaluation if she were to develop any signs or symptoms of illness.   Preop labs reviewed, unremarkable.   EKG 05/18/2023: NSR.  Rate 73.  PFTs 05/16/2022: FVC-%Pred-Pre % 97  FEV1-%Pred-Pre % 107  FEV1FVC-%Pred-Pre % 109  TLC % pred % 116  RV % pred % 84  DLCO unc % pred % 81   CHEST - 2 VIEW  05/05/2023:   COMPARISON:  Chest radiograph 02/23/2023.  Chest CT 01/10/2022   FINDINGS: Right-sided battery pack with lead coursing into the neck.The cardiomediastinal contours are normal. The lungs are clear. No radiographic evidence of bronchiectasis. Pulmonary vasculature is normal. No consolidation, pleural effusion, or pneumothorax. Mild thoracic spondylosis. Surgical hardware in the cervical spine, lumbar spine and right shoulder partially included. No acute osseous abnormalities are seen.   IMPRESSION: No acute chest findings. No radiographic evidence of bronchiectasis.   Event monitor 02/2022: Patient had a min HR of 69 bpm, max HR of 140 bpm, and avg HR of 94 bpm. Predominant underlying rhythm was Sinus Rhythm. 1 run of Supraventricular Tachycardia occurred lasting 5 beats with a max rate of 129 bpm (avg 125 bpm). Isolated SVEs were rare  (<1.0%), SVE Couplets were rare (<1.0%), and SVE Triplets were rare (<1.0%). Isolated VEs were rare (<1.0%), VE Couplets were rare (<1.0%), and no VE Triplets were present.   TTE 02/28/2022:  1. Left ventricular ejection fraction, by estimation, is 55 to 60%. The  left ventricle has normal function. The left ventricle has no regional  wall motion abnormalities. Left ventricular diastolic parameters were  normal.   2. Right ventricular systolic function is normal. The right ventricular  size is normal.   3. Interatrial septum is hypermobile.   4. The mitral valve is normal in structure. Mild mitral valve  regurgitation.   5. The inferior vena cava is normal in size with greater than 50%  respiratory variability, suggesting right atrial pressure of 3 mmHg.   Comparison(s): The  left ventricular function is unchanged.     Zannie Cove [facility] Short Stay Center/Anesthesiology Phone [phone] 10/09/2023 2:57 PM

## 2023-10-09 NOTE — Anesthesia Preprocedure Evaluation (Addendum)
 Anesthesia Evaluation  Patient identified by MRN, date of birth, ID band Patient awake    Reviewed: Allergy  & Precautions, NPO status , Patient's Chart, lab work & pertinent test results  History of Anesthesia Complications (+) history of anesthetic complications  Airway Mallampati: II  TM Distance: >3 FB Neck ROM: Full    Dental no notable dental hx. (+) Teeth Intact, Dental Advisory Given   Pulmonary shortness of breath, sleep apnea (Inspire device)  Bronchiectasis   Pulmonary exam normal breath sounds clear to auscultation       Cardiovascular hypertension, Pt. on medications (-) angina (-) Past MI Normal cardiovascular exam+ Valvular Problems/Murmurs (mild) MR  Rhythm:Regular Rate:Normal  02/2022 TTE 1. Left ventricular ejection fraction, by estimation, is 55 to 60%. The  left ventricle has normal function. The left ventricle has no regional  wall motion abnormalities. Left ventricular diastolic parameters were  normal.  2. Right ventricular systolic function is normal. The right ventricular  size is normal.  3. Interatrial septum is hypermobile.  4. The mitral valve is normal in structure. Mild mitral valve  regurgitation.  5. The inferior vena cava is normal in size with greater than 50%  respiratory variability, suggesting right atrial pressure of 3 mmHg.      Neuro/Psych  PSYCHIATRIC DISORDERS Anxiety Depression    Bells Palsy R TIACVA    GI/Hepatic ,GERD  ,,Crohns dz   Endo/Other  Hypothyroidism    Renal/GU Renal InsufficiencyRenal disease     Musculoskeletal  (+) Arthritis ,  Fibromyalgia -limited neck ROM s/p C2-C7 cervical fusion   Abdominal   Peds  Hematology Lab Results      Component                Value               Date                            K                        4.6                 10/08/2023                     CREATININE               0.96                10/08/2023                 Anesthesia Other Findings All: See list  Reproductive/Obstetrics                             Anesthesia Physical Anesthesia Plan  ASA: 3  Anesthesia Plan: General and Regional   Post-op Pain Management: Precedex , Ofirmev  IV (intra-op)*, Regional block* and Minimal or no pain anticipated   Induction: Intravenous  PONV Risk Score and Plan: Treatment may vary due to age or medical condition, Midazolam , Propofol  infusion and TIVA  Airway Management Planned: Oral ETT  Additional Equipment: None  Intra-op Plan:   Post-operative Plan:   Informed Consent: I have reviewed the patients History and Physical, chart, labs and discussed the procedure including the risks, benefits and alternatives for the proposed anesthesia with the patient or authorized representative who has indicated his/her understanding and  acceptance.     Dental advisory given  Plan Discussed with: CRNA and Surgeon  Anesthesia Plan Comments: (  GA w L ISB)        Anesthesia Quick Evaluation

## 2023-10-12 ENCOUNTER — Ambulatory Visit: Payer: Self-pay

## 2023-10-12 ENCOUNTER — Telehealth: Payer: Self-pay

## 2023-10-12 NOTE — Telephone Encounter (Signed)
 Copied from CRM [id number]. Topic: Clinical - Red Word Triage >> Oct 12, 2023  9:33 AM Nila Nephew wrote: Red Word that prompted transfer to Nurse Triage: SOB, dry cough, soreness of throat, etc. States would like to have it checked out before surgery scheduled for next week.   Chief Complaint: Cough Symptoms: Cough, runny nose, shortness of breath  Frequency: Frequent Disposition: [] ED /[] Urgent Care (no appt availability in office) / [x] Appointment(In office/virtual)/ []  [facility]/ [] Home Care/ [] Refused Recommended Disposition /[] [facility]/ []  Follow-up with PCP Additional Notes: Patient reports she has a surgery scheduled for 4/23 and would like to be evaluated for her cough before that time. She reports a cough, runny nose, and increased shortness of breath for the last 1-2 weeks. She states she has not had to use her inhaler and has not been checking her oxygen level. Patient denies any fevers. Only available appointment in the office was on 4/21. Patient agreeable with this time. Patient instructed to call back for new or worsening symptoms. Patient verbalized understanding and agreement with this plan.    E2C2 Pulmonary Triage - Initial Assessment Questions "Chief Complaint (e.g., cough, sob, wheezing, fever, chills, sweat or additional symptoms) *Go to specific symptom protocol after initial questions. Cough  "How long have symptoms been present?" 1-2 weeks  Have you tested for COVID or Flu? Note: If not, ask patient if a home test can be taken. If so, instruct patient to call back for positive results. No  MEDICINES:   "Have you used any OTC meds to help with symptoms?" No If yes, ask "What medications?" N/A  "Have you used your inhalers/maintenance medication?" No If yes, "What medications?" N/A  If inhaler, ask "How many puffs and how often?" Note: Review instructions on medication in the chart. N/A  OXYGEN: "Do you wear supplemental oxygen?" No If  yes, "How many liters are you supposed to use?" N/A  "Do you monitor your oxygen levels?" No If yes, "What is your reading (oxygen level) today?" N/A  "What is your usual oxygen saturation reading?"  (Note: Pulmonary O2 sats should be 90% or greater) 95-100%   Reason for Disposition  [1] Nasal discharge AND [2] present > 10 days    First available appointment scheduled  Answer Assessment - Initial Assessment Questions 1. ONSET: "When did the cough begin?"      1-2 weeks 2. SEVERITY: "How bad is the cough today?"      Moderate  3. SPUTUM: "Describe the color of your sputum" (none, dry cough; clear, white, yellow, green)     None 4. HEMOPTYSIS: "Are you coughing up any blood?" If so ask: "How much?" (flecks, streaks, tablespoons, etc.)     None 5. DIFFICULTY BREATHING: "Are you having difficulty breathing?" If Yes, ask: "How bad is it?" (e.g., mild, moderate, severe)    - MILD: No SOB at rest, mild SOB with walking, speaks normally in sentences, can lie down, no retractions, pulse < 100.    - MODERATE: SOB at rest, SOB with minimal exertion and prefers to sit, cannot lie down flat, speaks in phrases, mild retractions, audible wheezing, pulse 100-120.    - SEVERE: Very SOB at rest, speaks in single words, struggling to breathe, sitting hunched forward, retractions, pulse > 120      Moderate  6. FEVER: "Do you have a fever?" If Yes, ask: "What is your temperature, how was it measured, and when did it start?"     No 7.  CARDIAC HISTORY: "Do you have any history of heart disease?" (e.g., heart attack, congestive heart failure)      Yes 8. LUNG HISTORY: "Do you have any history of lung disease?"  (e.g., pulmonary embolus, asthma, emphysema)     Yes 9. PE RISK FACTORS: "Do you have a history of blood clots?" (or: recent major surgery, recent prolonged travel, bedridden)     No 10. OTHER SYMPTOMS: "Do you have any other symptoms?" (e.g., runny nose, wheezing, chest pain)       Runny nose  with yellow discharge  Protocols used: Cough - Acute Non-Productive-A-AH

## 2023-10-12 NOTE — Telephone Encounter (Signed)
 Copied from CRM [id number]. Topic: General - Other >> Oct 09, 2023  2:05 PM [name] wrote: Reason for CRM: Patient thought she was scheduled to see Eulas Hick today. Patient is scheduled for surgery later this month (28??) and needs to get in to see [facility] asap for clearance with her lungs are ok  Spoke with patient regarding prior message. Patient has a office visit with Dr.Young on 10/19/2023 3:30pm  Patient's voice was understanding.Nothing else further needed.

## 2023-10-12 NOTE — Telephone Encounter (Signed)
 Copied from CRM [id number]. Topic: General - Other >> Oct 09, 2023  2:05 PM [name] wrote: Reason for CRM: Patient thought she was scheduled to see Eulas Hick today. Patient is scheduled for surgery later this month (28??) and needs to get in to see [facility] asap for clearance with her lungs.

## 2023-10-19 ENCOUNTER — Ambulatory Visit (INDEPENDENT_AMBULATORY_CARE_PROVIDER_SITE_OTHER): Admitting: Internal Medicine

## 2023-10-19 ENCOUNTER — Encounter: Payer: Self-pay | Admitting: Internal Medicine

## 2023-10-19 ENCOUNTER — Ambulatory Visit: Admitting: Internal Medicine

## 2023-10-19 VITALS — BP 112/64 | HR 80 | Temp 97.9°F | Ht 63.0 in | Wt 158.4 lb

## 2023-10-19 DIAGNOSIS — G4733 Obstructive sleep apnea (adult) (pediatric): Secondary | ICD-10-CM | POA: Diagnosis not present

## 2023-10-19 DIAGNOSIS — Z7722 Contact with and (suspected) exposure to environmental tobacco smoke (acute) (chronic): Secondary | ICD-10-CM | POA: Diagnosis not present

## 2023-10-19 DIAGNOSIS — J479 Bronchiectasis, uncomplicated: Secondary | ICD-10-CM

## 2023-10-19 NOTE — Progress Notes (Unsigned)
 @Patient  ID: Crystal [name], female    DOB: 07/03/1950, 73 y.o.   MRN: [id number]  No chief complaint on file.   Referring provider: Abram Abraham, NP-C  HPI: 73 year old female, never smoked.  Past medical history significant for hypertension, aortic arthrosclerosis, OSA, bronchiectasis without complication, allergic rhinitis, Crohn's, GERD, chronic kidney disease stage II, arthropathy involving shoulder region, COVID-19 virus, recurrent TIAs, right total knee arthroplasty, lumbar spine stenosis, obesity.  Previous [facility] pulmonary encounter:  05/05/2023 Former patient of Dr. Matilde Son.  Followed by our office for obstructive sleep apnea, allergic rhinitis and bronchiectasis without complication. Patient was intolerant to CPAP and has inspire device (hypoglossal nerve stimulator).    Discussed the use of AI scribe software for clinical note transcription with the patient, who gave verbal consent to proceed.  History of Present Illness   Patient presents today for surgical risk assessment for left should surgery revision, tentatively scheduled for 1 January. She has a history of sleep apnea managed with an Inspire device, allergic rhinitis, and bronchiectasis.   She is tolerating inspire well.  She is 80% compliant with usage greater than 4 hours over the last 30 days.  Average pauses per night 0.0.  Current amplitude 2.0 voltage (level 4).  She had COVID-19 in August which has left her feeling fatigued. She also reports increased difficulty breathing, which she attributes to allergies or sensitivities. She has been taking an allergy  pill, possibly Allegra , and using Flonase  once daily, but reports these measures are not providing sufficient relief.  She reports that she stays "stocked up," suggesting nasal congestion. The patient has a history of receiving allergy  shots and has a deviated septum from frequent falls.  The patient's fatigue is not attributed to sleep apnea, as her  Inspire device download shows no pauses in breathing. The patient is on level four of the Inspire device, which is just below the highest level.   The patient is not on oxygen  or blood thinners and has no history of blood clots.    10/19/2023- interim hx  Patient presents today for OSA/Inspire follow-up.   Discussed the use of AI scribe software for clinical note transcription with the patient, who gave verbal consent to proceed.  History of Present Illness   Hx sleep apnea, intolerant to CPAP. Patient uses Inspire device consistently. She is 80% compliant with usage greater than 4 hours over the last 30 days (06/10/23-07/09/23).  Average usage 8 hours 15 mins. Average pauses per night 0.0. Current amplitude 2.0 voltage (level 5).  She reports sporadic insomnia, occurring approximately once or twice a month, which she attributes to an overactive mind. She denies any correlation between these episodes and her Inspire device.  Despite these occasional sleep disturbances, the patient expresses confidence in her Inspire device, noting improved sleep quality and reduced nocturnal awakenings. However, she reports an increase in sleep talking, which does not disrupt her own sleep but does affect her partner.  The patient also describes an incident where she fell and hit her head during a daytime nap, during which the Inspire device was not in use. She recalls acting out her dreams, including making movements in the air, which she realizes upon waking were not interacting with anything tangible.  The patient also mentions an upcoming surgery for a shoulder condition. She expresses concern about a persistent discomfort in her back, which started during a bout of COVID-19 before Labor Day and has not completely resolved. The discomfort is located in the back and  radiates across the body, and is exacerbated by breathing. The patient is unsure whether this discomfort is due to a muscle pull, a kidney issue, or  another cause. She has no significant cough.   The patient is currently using her Inspire device at a level five, one below her highest possible setting. She reports that this level is comfortable, but notes that her tongue is constantly moving and does not settle down. She also mentions that she often wakes up in the morning with the device already off, and sometimes forgets to pause it when she wakes up in the middle of the night.    10/19/2023- interim hx  Patient presents today for OSA/Inspire follow-up.   Discussed the use of AI scribe software for clinical note transcription with the patient, who gave verbal consent to proceed.  History of Present Illness   Hx sleep apnea, intolerant to CPAP. Patient uses Inspire device consistently. She is 80% compliant with usage greater than 4 hours over the last 30 days (06/10/23-07/09/23).  Average usage 8 hours 15 mins. Average pauses per night 0.0. Current amplitude 2.0 voltage (level 5).  She reports sporadic insomnia, occurring approximately once or twice a month, which she attributes to an overactive mind. She denies any correlation between these episodes and her Inspire device.  Despite these occasional sleep disturbances, the patient expresses confidence in her Inspire device, noting improved sleep quality and reduced nocturnal awakenings. However, she reports an increase in sleep talking, which does not disrupt her own sleep but does affect her partner.  The patient also describes an incident where she fell and hit her head during a daytime nap, during which the Inspire device was not in use. She recalls acting out her dreams, including making movements in the air, which she realizes upon waking were not interacting with anything tangible.  Allergies  Allergen Reactions   Methocarbamol  Shortness Of Breath, Nausea Only and Other (See Comments)    Other reaction(s): Dizziness, psychological reaction, disoriented Sleepy and weight loss    Codeine  Other (See Comments)    Headache   Statins Other (See Comments)    Extreme pain   Azathioprine    Other Other (See Comments)    MINT=sore mouth and stomach upset   Oxycodone      Dizziness, unbalanced.   Amitriptyline Other (See Comments)    Confusion and hallucinations   Erythromycin Base Rash    Flushing/red face.   Fish Oil Nausea Only   Flax Seed [Flax Seed Oil] Rash   Flaxseed (Linseed) Rash   Hydrocodone  Itching and Rash        Morphine Nausea And Vomiting and Rash   Nsaids Nausea Only   Nuvigil [Armodafinil] Other (See Comments)    Unspecified   Sertraline Rash   Sinemet [Carbidopa-Levodopa] Rash   Sulfa Antibiotics Rash   Tolmetin Nausea Only    Immunization History  Administered Date(s) Administered   DT (Pediatric) 01/03/2015   Fluad Quad(high Dose 65+) 04/25/2022, 05/01/2023   Fluad Trivalent(High Dose 65+) 02/19/2018   Influenza Split 03/30/2010, 03/31/2011, 05/14/2014, 02/27/2016, 04/16/2018   Influenza Whole 03/30/2010   Influenza, High Dose Seasonal PF 03/13/2015, 07/21/2019, 02/16/2020, 04/08/2021   Influenza-Unspecified 03/31/2011, 05/14/2014, 02/27/2016, 04/16/2018   PFIZER Comirnaty(Gray Top)Covid-19 Tri-Sucrose Vaccine 09/19/2019, 10/10/2019, 07/13/2020, 05/01/2023   PFIZER(Purple Top)SARS-COV-2 Vaccination 09/19/2019, 10/10/2019   Pfizer Covid-19 Vaccine Bivalent Booster 87yrs & up 05/14/2021   Pneumococcal Conjugate-13 06/30/2005, 04/17/2015, 01/23/2019   Pneumococcal Polysaccharide-23 01/06/2017   Pneumococcal-Unspecified 06/30/2005   Respiratory Syncytial  Virus Vaccine,Recomb Aduvanted(Arexvy) 07/01/2022   Td 06/30/2004   Td (Adult),5 Lf Tetanus Toxid, Preservative Free 06/30/2004   Td,absorbed, Preservative Free, Adult Use, Lf Unspecified 06/30/2004   Unspecified SARS-COV-2 Vaccination 04/25/2022   Zoster Recombinant(Shingrix) 05/14/2021, 08/22/2021   Zoster, Live 07/01/2007    Past Medical History:  Diagnosis Date   Allergy  1966    Anemia    Anxiety 2024   Nerve pain   Arthritis    ASCVD (arteriosclerotic cardiovascular disease)    Blood transfusion without reported diagnosis    Hospital-surgery   Cataract 2000's   Surgery   Chronic kidney disease 2020's   [facility]   Closed nondisplaced subtrochanteric fracture of left femur [facility]) 09/03/2017   September 2018.  Status post surgery by Dr. 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   TIA   Sleep apnea    does not use CPAP   Spinal headache    with the C section   Stroke (HCC)    Hx TIAs x 3   last was around 2014   TIA (transient ischemic attack)    3         Ulcer    One   Vitamin D  deficiency     Tobacco History: Social History   Tobacco Use  Smoking Status Never   Passive exposure: Past  Smokeless Tobacco Never   Counseling given: Not Answered   Outpatient Medications Prior to Visit  Medication Sig Dispense Refill   acyclovir  (ZOVIRAX ) 400 MG tablet TAKE 1 TABLET BY MOUTH EVERY DAY 90 tablet 1   bisoprolol -hydrochlorothiazide  (ZIAC ) 10-6.25 MG tablet TAKE 1 TABLET BY MOUTH EVERY DAY 90 tablet 2   Black Pepper-Turmeric (TURMERIC PLUS BLACK PEPPER EXT PO) Take 1 tablet by mouth daily.     buPROPion  (WELLBUTRIN  XL) 300 MG 24 hr tablet Take 1 tablet (300 mg total) by mouth daily for mood, focus & concentration. 90 tablet 3   carboxymethylcellulose (REFRESH PLUS) 0.5 % SOLN Place 1 drop into both eyes 3 (three) times  daily as needed (dry eyes).     clobetasol ointment (TEMOVATE) 0.05 % Apply 1 Application topically 3 (three) times daily as needed (lichen planus).     clotrimazole  (MYCELEX ) 10 MG troche Dissolve 1 tablet (10 mg total) in mouth 3 (three) times daily. 140 Troche 11   dexamethasone  (DECADRON ) 0.5 MG/5ML solution Take 5 mLs (0.5 mg total) by mouth 3 (three) times daily. (Patient taking differently: Take 0.5 mg by mouth 2 (two) times daily.) 100 mL 1   diclofenac  Sodium (VOLTAREN ) 1 % GEL Apply 2 grams topically daily as needed (pain). 100 g 1   DULoxetine  (CYMBALTA ) 60 MG capsule Take 1 capsule (60 mg total) by mouth daily for chronic pain. 90 capsule 3   fenofibrate  micronized (LOFIBRA) 134 MG capsule Take 1 capsule (134 mg  total) by mouth daily before breakfast 90 capsule 3   Ferrous Sulfate (IRON PO) Take 25 mg by mouth daily.     fluticasone  (FLONASE ) 50 MCG/ACT nasal spray Use  1-2 Sprays  each nostril   1-2 x /day  as needed 16 g 5   gabapentin  (NEURONTIN ) 300 MG capsule Take  1 capsule  3 x /day  or Chronic Pain    (Dx: m79.7)                                               /                                TAKE                                   BY                                  MOUTH     levothyroxine  (SYNTHROID ) 88 MCG tablet Take 1 tablet (88 mcg total) by mouth daily on an empty stomach with water  for 30 minutes.  No antacids, calcium , magnesium  for 4 hours. Avoid biotin. 90 tablet 3   meclizine  (ANTIVERT ) 25 MG tablet TAKE 1 TABLET (25 MG TOTAL) BY MOUTH 3 (THREE) TIMES DAILY AS NEEDED FOR DIZZINESS. 90 tablet 0   montelukast  (SINGULAIR ) 10 MG tablet Take 1 tablet (10 mg total) by mouth at bedtime. 30 tablet 5   mupirocin ointment (BACTROBAN) 2 % Apply 1 Application topically 3 (three) times daily as needed (lichen planus).     Na Sulfate-K Sulfate-Mg Sulfate concentrate 17.5-3.13-1.6 GM/177ML SOLN Use as directed. (Patient not taking: Reported on 10/08/2023) 354 mL 0    neomycin -polymyxin-hydrocortisone (CORTISPORIN) OTIC solution Place 3 drops into both ears daily as needed (ear inflammation).     nystatin  cream (MYCOSTATIN ) Apply 1 Application topically 2 (two) times daily. 30 g 0   omeprazole  (PRILOSEC) 40 MG capsule Take 1 capsule (40 mg total) by mouth daily. 90 capsule 3   OVER THE COUNTER MEDICATION Place 1 drop under the tongue daily. D3 5000 units - K2 120 mcg     oxybutynin  (DITROPAN ) 5 MG tablet Take 1 tablet 2 x /day fr Excessive Sweating 180 tablet 3   polyethylene glycol (MIRALAX  / GLYCOLAX ) 17 g packet Take 17-51 g by mouth 3 (three) times daily. Take 3 doses daily     Rectal Protectant-Emollient (CALMOL-4) 76-10 % SUPP Use as directed once to twice daily (Patient taking differently: Place 1 Dose rectally daily as needed (Hemorrhoids).)     Risankizumab -rzaa ([name] ) 360 MG/2.4ML SOCT Inject 1 Cartridge into the skin every 8 (eight) weeks. 2.4 mL 5   rOPINIRole  (REQUIP ) 2 MG tablet Take 1 tablet (2 mg total) by mouth 2 (two) times daily AND 2 tablets (4 mg total) at bedtime for restless legs 360 tablet 3   tacrolimus  (PROTOPIC ) 0.1 % ointment Apply to under arms 2 (two) times daily. (Patient not taking: Reported on 10/08/2023) 100 g 0   tizanidine  (ZANAFLEX ) 2 MG capsule Take 1 capsule (2 mg total) by mouth at bedtime for muscle spasm. 90 capsule 3  traMADol  (ULTRAM ) 50 MG tablet Take 50 mg by mouth every 6 (six) hours as needed for moderate pain or severe pain.     Vitamin D -Vitamin K (K2-D3 5000 PO) Take 5,000 Units by mouth daily.     No facility-administered medications prior to visit.   Review of Systems  Review of Systems  Constitutional: Negative.   HENT: Negative.    Respiratory: Negative.    Cardiovascular: Negative.   Musculoskeletal:  Positive for back pain.   Physical Exam  There were no vitals taken for this visit. Physical Exam Constitutional:      Appearance: Normal appearance.  HENT:     Head: Normocephalic and  atraumatic.     Mouth/Throat:     Mouth: Mucous membranes are moist.     Pharynx: Oropharynx is clear.  Cardiovascular:     Rate and Rhythm: Normal rate and regular rhythm.  Pulmonary:     Effort: Pulmonary effort is normal.     Breath sounds: Normal breath sounds.     Comments: CTA Musculoskeletal:     Cervical back: Normal range of motion.  Skin:    General: Skin is warm and dry.  Neurological:     General: No focal deficit present.     Mental Status: She is alert and oriented to person, place, and time. Mental status is at baseline.  Psychiatric:        Mood and Affect: Mood normal.        Behavior: Behavior normal.        Thought Content: Thought content normal.        Judgment: Judgment normal.      Lab Results:  CBC    Component Value Date/Time   WBC 6.9 10/08/2023 1403   RBC 4.87 10/08/2023 1403   HGB 15.2 (H) 10/08/2023 1403   HGB 12.9 10/20/2012 1358   HCT 47.1 (H) 10/08/2023 1403   HCT 40.2 10/20/2012 1358   PLT 257 10/08/2023 1403   PLT 265 10/20/2012 1358   MCV 96.7 10/08/2023 1403   MCV 82.1 10/20/2012 1358   MCH 31.2 10/08/2023 1403   MCHC 32.3 10/08/2023 1403   RDW 13.2 10/08/2023 1403   RDW 16.9 (H) 10/20/2012 1358   LYMPHSABS 1,327 03/11/2023 1539   LYMPHSABS 1.3 10/20/2012 1358   MONOABS 0.7 01/19/2021 2316   MONOABS 0.6 10/20/2012 1358   EOSABS 173 05/18/2023 1528   EOSABS 0.1 10/20/2012 1358   BASOSABS 104 05/18/2023 1528   BASOSABS 0.1 10/20/2012 1358    BMET    Component Value Date/Time   NA 138 10/08/2023 1403   NA 142 03/28/2017 0000   NA 141 10/20/2012 1358   K 4.6 10/08/2023 1403   K 3.9 10/20/2012 1358   CL 101 10/08/2023 1403   CL 104 10/20/2012 1358   CO2 29 10/08/2023 1403   CO2 27 10/20/2012 1358   GLUCOSE 108 (H) 10/08/2023 1403   GLUCOSE 109 (H) 10/20/2012 1358   BUN 21 10/08/2023 1403   BUN 14 03/28/2017 0000   BUN 15.6 10/20/2012 1358   CREATININE 0.96 10/08/2023 1403   CREATININE 1.03 (H) 05/18/2023 1528    CREATININE 1.1 10/20/2012 1358   CALCIUM  9.6 10/08/2023 1403   CALCIUM  7.8 (L) 03/26/2017 0354   CALCIUM  9.7 10/20/2012 1358   GFRNONAA >60 10/08/2023 1403   GFRNONAA 80 07/04/2020 1457   GFRAA 93 07/04/2020 1457    BNP    Component Value Date/Time   BNP 32.3 01/19/2021 2316  ProBNP No results found for: "PROBNP"  Imaging: No results found.   Assessment & Plan:   There are no diagnoses linked to this encounter.      Obstructive Sleep Apnea (OSA) Patient is using Inspire device with noted improvement in sleep quality and reduced snoring. Current amplitude 2.0 V (level 5). Average pause per night is 0.0. She reports occasional sleep talking. Discussed possible REM sleep disorder and potential treatment options, she will monitor symptoms and notify us  if they become disruptive. -Continue use of Inspire device nightly 4-6 hours or longer/ and with naps  -Plan for a follow-up sleep study in December 2025 to assess efficacy of Inspire device.     Preoperative Assessment Patient is planning for left shoulder surgery,currently on hold due to dental issues. Previous assessment deemed patient intermediate risk for prolonged mechanical ventilation due to history of bronchiectasis and sleep apnea. -Recommend surgery be performed in a hospital setting. -Advise patient to bring Inspire remote to surgery.  Unexplained Back Pain Patient reports persistent back pain since a previous COVID-19 infection. Pain is located to left mid-lower back and radiates across the body. -Recommend follow-up with primary care provider or consider a CT scan to better assess lung and back area.  Allergic Rhinitis -Continue Flonase  daily, singulair  and OTC antihistamine  -Resume use of saline nasal rinse 1-2 times daily   Bronchiectasis without complication  - No active cough - Continue mucinex  600mg  1-2 time a day prn  - CXR 05/05/23 showed no acute chest findings, clear lungs. If back pain does not  resolved recommend getting HRCT imaging of chest to re-evaluate bronchiectasis   [employer], MD 10/19/2023

## 2023-10-19 NOTE — Patient Instructions (Signed)
 Ok to go ahead with planned shoulder surgery  Look for opportunities to keep walking to keep you endurance up;   We will get you  back later in the summer, but let us  know if you need our help sooner.

## 2023-10-20 NOTE — Discharge Instructions (Signed)
 Grafton Lawrence MD, MPH Nicholas Bari, PA-C [facility] [address] [phone] (tel)   [phone] (fax)   POST-OPERATIVE INSTRUCTIONS - TOTAL SHOULDER REPLACEMENT    WOUND CARE You may leave the operative dressing in place until your follow-up appointment. KEEP THE INCISIONS CLEAN AND DRY. There may be a small amount of fluid/bleeding leaking at the surgical site. This is normal after surgery.  If it fills with liquid or blood please call us  immediately to change it for you. Use the provided ice machine or Ice packs as often as possible for the first 3-4 days, then as needed for pain relief.   Keep a layer of cloth or a shirt between your skin and the cooling unit to prevent frost bite as it can get very cold.  SHOWERING: - You may shower on Post-Op Day #2.  - The dressing is water  resistant but do not scrub it as it may start to peel up.   - You may remove the sling for showering - Gently pat the area dry.  - Do not soak the shoulder in water .  - Do not go swimming in the pool or ocean until your incision has completely healed (about 4-6 weeks after surgery) - KEEP THE INCISIONS CLEAN AND DRY.  EXERCISES Wear the sling at all times  You may remove the sling for showering, but keep the arm across the chest or in a secondary sling.    Accidental/Purposeful External Rotation and shoulder flexion (reaching behind you) is to be avoided at all costs for the first month. It is ok to come out of your sling if your are sitting and have assistance for eating.   Do not lift anything heavier than 1 pound until we discuss it further in clinic.  It is normal for your fingers/hand to become more swollen after surgery and discolored from bruising.   This will resolve over the first few weeks usually after surgery. Please continue to ambulate and do not stay sitting or lying for too long.  Perform foot and wrist pumps to assist in circulation.  PHYSICAL  THERAPY - You will begin physical therapy soon after surgery (unless otherwise specified) - Please call to set up an appointment, if you do not already have one  - Let our office if there are any issues with scheduling your therapy   - A PT referral was sent to [facility] [address]   REGIONAL ANESTHESIA (NERVE BLOCKS) The anesthesia team may have performed a nerve block for you this is a great tool used to minimize pain.   The block may start wearing off overnight (between 8-24 hours postop) When the block wears off, your pain may go from nearly zero to the pain you would have had postop without the block. This is an abrupt transition but nothing dangerous is happening.   This can be a challenging period but utilize your as needed pain medications to try and manage this period. We suggest you use the pain medication the first night prior to going to bed, to ease this transition.  You may take an extra dose of narcotic when this happens if needed   POST-OP MEDICATIONS- Multimodal approach to pain control In general your pain will be controlled with a combination of substances.  Prescriptions unless otherwise discussed are electronically sent to your pharmacy.  This is a carefully made plan we use to minimize narcotic use.     Celebrex  - Anti-inflammatory medication taken  on a scheduled basis Acetaminophen  - Non-narcotic pain medicine taken on a scheduled basis  Tramadol  - This is a strong narcotic, to be used only on an "as needed" basis for SEVERE pain. Aspirin  81mg  - This medicine is used to minimize the risk of blood clots after surgery. Phenergan  -  take as needed for nausea   FOLLOW-UP If you develop a Fever (>101.5), Redness or Drainage from the surgical incision site, please call our office to arrange for an evaluation. Please call the office to schedule a follow-up appointment for a wound check, 7-10 days post-operatively.  IF YOU HAVE ANY QUESTIONS, PLEASE FEEL FREE  TO CALL OUR OFFICE.  HELPFUL INFORMATION  Your arm will be in a sling following surgery. You will be in this sling for the next 4 weeks.   You may be more comfortable sleeping in a semi-seated position the first few nights following surgery.  Keep a pillow propped under the elbow and forearm for comfort.  If you have a recliner type of chair it might be beneficial.  If not that is fine too, but it would be helpful to sleep propped up with pillows behind your operated shoulder as well under your elbow and forearm.  This will reduce pulling on the suture lines.  When dressing, put your operative arm in the sleeve first.  When getting undressed, take your operative arm out last.  Loose fitting, button-down shirts are recommended.  In most states it is against the law to drive while your arm is in a sling. And certainly against the law to drive while taking narcotics.  You may return to work/school in the next couple of days when you feel up to it. Desk work and typing in the sling is fine.  We suggest you use the pain medication the first night prior to going to bed, in order to ease any pain when the anesthesia wears off. You should avoid taking pain medications on an empty stomach as it will make you nauseous.  You should wean off your narcotic medicines as soon as you are able.     Most patients will be off narcotics before their first postop appointment.   Do not drink alcoholic beverages or take illicit drugs when taking pain medications.  Pain medication may make you constipated.  Below are a few solutions to try in this order: Decrease the amount of pain medication if you aren't having pain. Drink lots of decaffeinated fluids. Drink prune juice and/or each dried prunes  If the first 3 don't work start with additional solutions Take Colace - an over-the-counter stool softener Take Senokot - an over-the-counter laxative Take Miralax  - a stronger over-the-counter laxative   Dental  Antibiotics:  In most cases prophylactic antibiotics for Dental procdeures after total joint surgery are not necessary.  Exceptions are as follows:  1. History of prior total joint infection  2. Severely immunocompromised (Organ Transplant, cancer chemotherapy, Rheumatoid biologic meds such as Humira)  3. Poorly controlled diabetes (A1C &gt; 8.0, blood glucose over 200)  If you have one of these conditions, contact your surgeon for an antibiotic prescription, prior to your dental procedure.   For more information including helpful videos and documents visit our website:   [email or web address]

## 2023-10-20 NOTE — H&P (Signed)
 PREOPERATIVE H&P  Chief Complaint: Primary osteoarthritis of left shoulder  HPI: Crystal [name] is a 73 y.o. female who is scheduled for Procedure(s): ARTHROPLASTY, SHOULDER, TOTAL, REVERSE.   Patient is a 73 year-old with a complex medical history, including Crohn's, Bell's palsy and a previous right reverse total shoulder done at [facility] by Dr. Cherre Cornish.  She has had left shoulder pain for some time.  She has had multiple cuff repairs, including a patch surgery done by Dr. Marland Silvas remotely.  She has had mini open repair.  She has failed these things and continues to have pain.  Her motion has been preserved.  Her primary issue has been pain.  She wants to know what her options are.  Of note, she has a history of issues with pain medicine.  She reports that she has multiple allergies and the only medication that has ever helped in the post-op setting was OxyContin  rather than Oxycodone .  She reports an allergy  discreetly to Oxycodone , but is able to tolerate OxyContin .  She uses Tramadol  currently.  She has a history of back issues as well.  She had an infection of her back after surgery in the past.  No previous history of infections in regards to her shoulder.    Symptoms are rated as moderate to severe, and have been worsening.  This is significantly impairing activities of daily living.    Please see clinic note for further details on this patient's care.    She has elected for surgical management.   Past Medical History:  Diagnosis Date   Allergy  1966   Anemia    Anxiety 2024   Nerve pain   Arthritis    ASCVD (arteriosclerotic cardiovascular disease)    Blood transfusion without reported diagnosis    Hospital-surgery   Cataract 2000's   Surgery   Chronic kidney disease 2020's   [facility] hospital   Closed nondisplaced subtrochanteric fracture of left femur [facility]) 09/03/2017   September 2018.  Status post surgery by Dr. Guyann Leitz   Crohn's disease of ileum without  complication (HCC)    DDD (degenerative disc disease), lumbar    Depression    Dyspnea    Eye infection    Fibromyalgia    Gait disorder 10/26/2012   GERD (gastroesophageal reflux disease)    GI bleed    Glaucoma 2000's   Stable   Hyperlipidemia    Hypertension    Hypothyroid    Neuromuscular disorder (HCC)    Bell"s Palsy- right side of face affected   Periprosthetic supracondylar fracture of femur, left  03/23/2017   Pneumonia    Pre-diabetes    Restless leg syndrome    Rhinitis 06/25/2010   Seizures (HCC) ?   TIA   Sleep apnea    does not use CPAP   Spinal headache    with the C section   Stroke (HCC)    Hx TIAs x 3   last was around 2014   TIA (transient ischemic attack)    3         Ulcer    One   Vitamin D  deficiency    Past Surgical History:  Procedure Laterality Date   ABDOMINAL HYSTERECTOMY  1992   ANTERIOR CERVICAL DECOMPRESSION/DISCECTOMY FUSION 4 LEVELS N/A 06/14/2015   Procedure: Cervical three-four Cervical four-five Cervical five- six Cervical six- seven  Anterior cervical decompression/diskectomy/fusion;  Surgeon: Manya Sells, MD;  Location: [facility];  Service: Neurosurgery;  Laterality: N/A;  C3-4 C4-5  C5-6 C6-7 Anterior cervical decompression/diskectomy/fusion   APPENDECTOMY  1960   BACK SURGERY     surgeries x 3   CATARACT EXTRACTION, BILATERAL     CESAREAN SECTION  1983 & 1886   DRUG INDUCED ENDOSCOPY N/A 02/25/2022   Procedure: DRUG INDUCED SLEEP ENDOSCOPY;  Surgeon: Ammon Bales, MD;  Location: [facility];  Service: ENT;  Laterality: N/A;   EYE SURGERY     bilateral cataracts   FINGER ARTHROSCOPY     FRACTURE SURGERY  2020   Femur   IMPLANTATION OF HYPOGLOSSAL NERVE STIMULATOR Right 05/29/2022   Procedure: IMPLANTATION OF HYPOGLOSSAL NERVE STIMULATOR;  Surgeon: Ammon Bales, MD;  Location: [facility];  Service: ENT;  Laterality: Right;   JOINT REPLACEMENT     2   LEFT  1 RIGHT    KNEE SURGERY     ORIF FEMUR FRACTURE  Left 03/24/2017   Procedure: OPEN REDUCTION INTERNAL FIXATION (ORIF) FEMUR FRACTURE;  Surgeon: Hardy Lia, MD;  Location: [facility] OR;  Service: Orthopedics;  Laterality: Left;   ROTATOR CUFF REPAIR Left 2005   2 LEFT  1  RIGHT   SHOULDER ADHESION RELEASE     SPINAL CORD DECOMPRESSION  1998   SPINE SURGERY     TOE SURGERY     LEFT        TOE SURGERY     RIGHT  BIG TOE   TONSILLECTOMY  1960   TRANSFORAMINAL LUMBAR INTERBODY FUSION W/ MIS 1 LEVEL Left 09/05/2021   Procedure: MINIMALLY INVASIVE TRANSFORAMINAL LUMBAR INTERBODY FUSION LUMBAR THREE-FOUR; EXPLORE FUSION WITH REVISION OF HARDWARE LUMBAR FOUR-FIVE,LEFT SIDE APPROACH;  Surgeon: Pincus Bridgeman, DO;  Location: [facility] OR;  Service: Neurosurgery;  Laterality: Left;   Social History   Socioeconomic History   Marital status: Married    Spouse name: Not on file   Number of children: 2   Years of education: Not on file   Highest education level: Bachelor's degree (e.g., BA, AB, BS)  Occupational History   Occupation: Retired Advertising account executive: DISABLED  Tobacco Use   Smoking status: Never    Passive exposure: Past   Smokeless tobacco: Never  Vaping Use   Vaping status: Never Used  Substance and Sexual Activity   Alcohol use: Yes    Alcohol/week: 4.0 standard drinks of alcohol    Types: 2 Cans of beer, 2 Standard drinks or equivalent per week    Comment: On and off   Drug use: No   Sexual activity: Not Currently    Birth control/protection: None    Comment: Hysterectomy  Other Topics Concern   Not on file  Social History Narrative   Drinks about 1 cup of coffee a day, occasional coke zero    Social Drivers of Corporate investment banker Strain: Low Risk  (08/23/2023)   Overall Financial Resource Strain (CARDIA)    Difficulty of Paying Living Expenses: Not hard at all  Food Insecurity: No Food Insecurity (08/23/2023)   Hunger Vital Sign    Worried About Running Out of Food in the Last Year: Never true    Ran Out of  Food in the Last Year: Never true  Transportation Needs: No Transportation Needs (08/23/2023)   PRAPARE - Transportation    Lack of Transportation (Medical): No    Lack of Transportation (Non-Medical): No  Physical Activity: Unknown (08/23/2023)   Exercise Vital Sign    Days of Exercise per Week: Patient declined    Minutes of  Exercise per Session: Not on file  Stress: Stress Concern Present (08/23/2023)   [employer] of Occupational Health - Occupational Stress Questionnaire    Feeling of Stress : To some extent  Social Connections: Unknown (08/23/2023)   Social Connection and Isolation Panel [NHANES]    Frequency of Communication with Friends and Family: More than three times a week    Frequency of Social Gatherings with Friends and Family: Once a week    Attends Religious Services: Patient declined    Database administrator or Organizations: Yes    Attends Engineer, structural: More than 4 times per year    Marital Status: Married   Family History  Problem Relation Age of Onset   Cancer Mother    Diabetes Mother    Osteoporosis Mother    Breast cancer Mother    Arthritis Mother    Miscarriages / Stillbirths Mother    Obesity Mother    Heart disease Father    Hypertension Father    Hyperlipidemia Father    Neuropathy Father    Stroke Father    Leukemia Sister    Lupus Sister    Early death Sister    Miscarriages / [place] Sister    Depression Sister    Rheum arthritis Sister    Cancer Sister    Miscarriages / [place] Sister    Migraines Daughter    Cancer Daughter    Cancer Son    Early death Son    Liver disease Neg Hx    Colon cancer Neg Hx    Esophageal cancer Neg Hx    Allergies  Allergen Reactions   Methocarbamol  Shortness Of Breath, Nausea Only and Other (See Comments)    Other reaction(s): Dizziness, psychological reaction, disoriented Sleepy and weight loss    Codeine Other (See Comments)    Headache   Statins Other (See  Comments)    Extreme pain   Azathioprine    Other Other (See Comments)    MINT=sore mouth and stomach upset   Oxycodone      Dizziness, unbalanced.   Amitriptyline Other (See Comments)    Confusion and hallucinations   Erythromycin Base Rash    Flushing/red face.   Fish Oil Nausea Only   Flax Seed [Flax Seed Oil] Rash   Flaxseed (Linseed) Rash   Hydrocodone  Itching and Rash        Morphine Nausea And Vomiting and Rash   Nsaids Nausea Only   Nuvigil [Armodafinil] Other (See Comments)    Unspecified   Sertraline Rash   Sinemet [Carbidopa-Levodopa] Rash   Sulfa Antibiotics Rash   Tolmetin Nausea Only   Prior to Admission medications   Medication Sig Start Date End Date Taking? Authorizing Provider  acetaminophen  (TYLENOL ) 500 MG tablet Take 1,000 mg by mouth 3 (three) times daily.   Yes [provider]  acyclovir  (ZOVIRAX ) 400 MG tablet TAKE 1 TABLET BY MOUTH EVERY DAY 04/16/23  Yes Cranford, Tonya, NP  bisoprolol -hydrochlorothiazide  (ZIAC ) 10-6.25 MG tablet TAKE 1 TABLET BY MOUTH EVERY DAY 07/13/23  Yes Wilkinson, Dana E, FNP  Black Pepper-Turmeric (TURMERIC PLUS BLACK PEPPER EXT PO) Take 1 tablet by mouth daily.   Yes [provider]  buPROPion  (WELLBUTRIN  XL) 300 MG 24 hr tablet Take 1 tablet (300 mg total) by mouth daily for mood, focus & concentration. 03/09/23  Yes Vangie Genet, MD  carboxymethylcellulose (REFRESH PLUS) 0.5 % SOLN Place 1 drop into both eyes 3 (three) times daily as needed (dry eyes).  Yes [provider]  clobetasol ointment (TEMOVATE) 0.05 % Apply 1 Application topically 3 (three) times daily as needed (lichen planus). 02/10/22  Yes [provider]  clotrimazole  (MYCELEX ) 10 MG troche Dissolve 1 tablet (10 mg total) in mouth 3 (three) times daily. 10/07/22  Yes   dexamethasone  (DECADRON ) 0.5 MG/5ML solution Take 5 mLs (0.5 mg total) by mouth 3 (three) times daily. Patient taking differently: Take 0.5 mg by mouth 2 (two)  times daily. 03/09/23  Yes Cranford, Tonya, NP  diclofenac  Sodium (VOLTAREN ) 1 % GEL Apply 2 grams topically daily as needed (pain). 10/17/22  Yes Cranford, Tonya, NP  DULoxetine  (CYMBALTA ) 60 MG capsule Take 1 capsule (60 mg total) by mouth daily for chronic pain. 05/18/23  Yes Wilkinson, Dana E, FNP  fenofibrate  micronized (LOFIBRA) 134 MG capsule Take 1 capsule (134 mg total) by mouth daily before breakfast 10/18/22  Yes Wilkinson, Dana E, FNP  Ferrous Sulfate (IRON PO) Take 25 mg by mouth daily.   Yes [provider]  fluconazole  (DIFLUCAN ) 200 MG tablet Take 200 mg by mouth daily. 09/24/23  Yes [provider]  fluticasone  (FLONASE ) 50 MCG/ACT nasal spray Use  1-2 Sprays  each nostril   1-2 x /day  as needed 05/05/23  Yes Antonio Baumgarten, NP  gabapentin  (NEURONTIN ) 300 MG capsule Take  1 capsule  3 x /day  or Chronic Pain    (Dx: m79.7)                                               /                                TAKE                                   BY                                  MOUTH 03/05/23  Yes Vangie Genet, MD  Glycerin, Laxative, (FLEET LIQUID GLYCERIN SUPP RE) Place 1 Dose rectally daily as needed (Constipation).   Yes [provider]  levothyroxine  (SYNTHROID ) 88 MCG tablet Take 1 tablet (88 mcg total) by mouth daily on an empty stomach with water  for 30 minutes.  No antacids, calcium , magnesium  for 4 hours. Avoid biotin. 05/18/23  Yes Wilkinson, Dana E, FNP  meclizine  (ANTIVERT ) 25 MG tablet TAKE 1 TABLET (25 MG TOTAL) BY MOUTH 3 (THREE) TIMES DAILY AS NEEDED FOR DIZZINESS. 06/13/18  Yes Vangie Genet, MD  mupirocin ointment (BACTROBAN) 2 % Apply 1 Application topically 3 (three) times daily as needed (lichen planus).   Yes [provider]  neomycin -polymyxin-hydrocortisone (CORTISPORIN) OTIC solution Place 3 drops into both ears daily as needed (ear inflammation).   Yes [provider]  nystatin  cream (MYCOSTATIN ) Apply 1 Application  topically 2 (two) times daily. 01/21/23  Yes Cranford, Lynnie Saucier, NP  omeprazole  (PRILOSEC) 40 MG capsule Take 1 capsule (40 mg total) by mouth daily. 12/20/22  Yes Wilkinson, Dana E, FNP  OVER THE COUNTER MEDICATION Place 1 drop under the tongue daily. D3 5000 units - K2 120 mcg   Yes [provider]  oxybutynin  (DITROPAN ) 5 MG tablet Take 1 tablet 2 x /day fr Excessive Sweating 05/18/23  Yes Vangie Genet, MD  polyethylene glycol (MIRALAX  / GLYCOLAX ) 17 g packet Take 17-51 g by mouth 3 (three) times daily. Take 3 doses daily   Yes [provider]  Rectal Protectant-Emollient (CALMOL-4) 76-10 % SUPP Use as directed once to twice daily Patient taking differently: Place 1 Dose rectally daily as needed (Hemorrhoids). 05/13/23  Yes Armbruster, Lendon Queen, MD  Risankizumab -rzaa ([name] ) 360 MG/2.4ML SOCT Inject 1 Cartridge into the skin every 8 (eight) weeks. 08/18/23  Yes Armbruster, Lendon Queen, MD  rOPINIRole  (REQUIP ) 2 MG tablet Take 1 tablet (2 mg total) by mouth 2 (two) times daily AND 2 tablets (4 mg total) at bedtime for restless legs 05/29/23  Yes Cranford, Tonya, NP  tizanidine  (ZANAFLEX ) 2 MG capsule Take 1 capsule (2 mg total) by mouth at bedtime for muscle spasm. 02/24/23  Yes Wilkinson, Dana E, FNP  traMADol  (ULTRAM ) 50 MG tablet Take 50 mg by mouth every 6 (six) hours as needed for moderate pain or severe pain. 11/08/21  Yes [provider]  Vitamin D -Vitamin K (K2-D3 5000 PO) Take 5,000 Units by mouth daily.   Yes [provider]    ROS: All other systems have been reviewed and were otherwise negative with the exception of those mentioned in the HPI and as above.  Physical Exam: General: Alert, no acute distress Cardiovascular: No pedal edema Respiratory: No cyanosis, no use of accessory musculature GI: No organomegaly, abdomen is soft and non-tender Skin: No lesions in the area of chief complaint Neurologic: Sensation intact distally Psychiatric: Patient  is competent for consent with normal mood and affect Lymphatic: No axillary or cervical lymphadenopathy  MUSCULOSKELETAL:  Range of motion of the right shoulder to 160, left shoulder to 140.  External rotation of 45.  Internal rotation to T8 and T5 on the contralateral side.  Well healed incision.  Relatively large mini open style incision off the anterolateral border of the acromion.    Imaging: X-rays of the left shoulder demonstrate cuff tear arthropathy.  [place] 4B.    BMI: Estimated body mass index is 28.06 kg/m as calculated from the following:   Height as of 10/19/23: 5\' 3"  (1.6 m).   Weight as of 10/19/23: 71.8 kg.  Lab Results  Component Value Date   ALBUMIN  4.2 10/08/2023   Diabetes:   Patient has a diagnosis of diabetes,  Lab Results  Component Value Date   HGBA1C 6.1 (H) 05/18/2023   Smoking Status:   reports that she has never smoked. She has been exposed to tobacco smoke. She has never used smokeless tobacco.     Assessment: Primary osteoarthritis of left shoulder  Plan: Plan for Procedure(s): ARTHROPLASTY, SHOULDER, TOTAL, REVERSE  Patient's shoulder itself on x-ray is relatively routine appearing with cuff tear arthropathy.  Reverse total shoulder arthroplasty would be indicated from a surgical perspective.  She has failed non-operative measures at this point.  Her compounding issues are her medical history, as well as her history with pain medicines.  We had a long discussion that we would not prescribe OxyContin  for post-op pain.  Instead we think that a nerve block and Tramadol , as she has used in the past, may be appropriate. She has a higher than normal risk of infection or dislocation, as she has had issues with these things in the past.    The risks benefits and alternatives were discussed with the  patient including but not limited to the risks of nonoperative treatment, versus surgical intervention including infection, bleeding, nerve injury,  blood  clots, cardiopulmonary complications, morbidity, mortality, among others, and they were willing to proceed.   We additionally specifically discussed risks of axillary nerve injury, infection, periprosthetic fracture, continued pain and longevity of implants prior to beginning procedure.    Patient will be closely monitored in PACU for medical stabilization and pain control. If found stable in PACU, patient may be discharged home with outpatient follow-up. If any concerns regarding patient's stabilization patient will be admitted for observation after surgery. The patient is planning to be discharged home with outpatient PT.   The patient acknowledged the explanation, agreed to proceed with the plan and consent was signed.   She has received operative clearance.  Operative Plan: Left reverse total shoulder arthroplasty Discharge Medications: standard(tramadol ) DVT Prophylaxis: aspirin  Physical Therapy: outpatient PT Special Discharge needs: Sling. IceMan   Adine Ahmadi, PA-C  10/20/2023 3:54 PM

## 2023-10-21 ENCOUNTER — Encounter (HOSPITAL_COMMUNITY)
Admission: RE | Disposition: A | Discharge status: Home / Self Care | Payer: Self-pay | Source: Ambulatory Visit | Attending: Orthopaedic Surgery

## 2023-10-21 ENCOUNTER — Ambulatory Visit (HOSPITAL_COMMUNITY): Admitting: Certified Registered Nurse Anesthetist

## 2023-10-21 ENCOUNTER — Ambulatory Visit (HOSPITAL_COMMUNITY)

## 2023-10-21 ENCOUNTER — Ambulatory Visit (HOSPITAL_COMMUNITY)
Admission: RE | Admit: 2023-10-21 | Discharge: 2023-10-21 | Disposition: A | Discharge status: Home / Self Care | Source: Ambulatory Visit | Attending: Orthopaedic Surgery | Admitting: Orthopaedic Surgery

## 2023-10-21 ENCOUNTER — Ambulatory Visit (HOSPITAL_COMMUNITY): Payer: Self-pay | Admitting: Physician Assistant

## 2023-10-21 ENCOUNTER — Other Ambulatory Visit: Payer: Self-pay

## 2023-10-21 ENCOUNTER — Encounter (HOSPITAL_COMMUNITY): Payer: Self-pay | Admitting: Orthopaedic Surgery

## 2023-10-21 DIAGNOSIS — Z96612 Presence of left artificial shoulder joint: Secondary | ICD-10-CM | POA: Diagnosis not present

## 2023-10-21 DIAGNOSIS — G510 Bell's palsy: Secondary | ICD-10-CM | POA: Diagnosis not present

## 2023-10-21 DIAGNOSIS — G4730 Sleep apnea, unspecified: Secondary | ICD-10-CM | POA: Diagnosis not present

## 2023-10-21 DIAGNOSIS — M12812 Other specific arthropathies, not elsewhere classified, left shoulder: Secondary | ICD-10-CM | POA: Diagnosis not present

## 2023-10-21 DIAGNOSIS — M1990 Unspecified osteoarthritis, unspecified site: Secondary | ICD-10-CM | POA: Insufficient documentation

## 2023-10-21 DIAGNOSIS — M19012 Primary osteoarthritis, left shoulder: Secondary | ICD-10-CM | POA: Diagnosis present

## 2023-10-21 DIAGNOSIS — Z8673 Personal history of transient ischemic attack (TIA), and cerebral infarction without residual deficits: Secondary | ICD-10-CM | POA: Diagnosis not present

## 2023-10-21 DIAGNOSIS — R011 Cardiac murmur, unspecified: Secondary | ICD-10-CM | POA: Insufficient documentation

## 2023-10-21 DIAGNOSIS — I10 Essential (primary) hypertension: Secondary | ICD-10-CM

## 2023-10-21 DIAGNOSIS — M797 Fibromyalgia: Secondary | ICD-10-CM | POA: Diagnosis not present

## 2023-10-21 DIAGNOSIS — I340 Nonrheumatic mitral (valve) insufficiency: Secondary | ICD-10-CM | POA: Insufficient documentation

## 2023-10-21 DIAGNOSIS — N189 Chronic kidney disease, unspecified: Secondary | ICD-10-CM | POA: Insufficient documentation

## 2023-10-21 DIAGNOSIS — N182 Chronic kidney disease, stage 2 (mild): Secondary | ICD-10-CM | POA: Diagnosis not present

## 2023-10-21 DIAGNOSIS — I129 Hypertensive chronic kidney disease with stage 1 through stage 4 chronic kidney disease, or unspecified chronic kidney disease: Secondary | ICD-10-CM | POA: Diagnosis not present

## 2023-10-21 DIAGNOSIS — K5000 Crohn's disease of small intestine without complications: Secondary | ICD-10-CM | POA: Insufficient documentation

## 2023-10-21 DIAGNOSIS — F418 Other specified anxiety disorders: Secondary | ICD-10-CM | POA: Diagnosis not present

## 2023-10-21 DIAGNOSIS — Z981 Arthrodesis status: Secondary | ICD-10-CM | POA: Diagnosis not present

## 2023-10-21 DIAGNOSIS — G8918 Other acute postprocedural pain: Secondary | ICD-10-CM | POA: Diagnosis not present

## 2023-10-21 DIAGNOSIS — K219 Gastro-esophageal reflux disease without esophagitis: Secondary | ICD-10-CM | POA: Diagnosis not present

## 2023-10-21 DIAGNOSIS — Z471 Aftercare following joint replacement surgery: Secondary | ICD-10-CM | POA: Diagnosis not present

## 2023-10-21 DIAGNOSIS — F32A Depression, unspecified: Secondary | ICD-10-CM | POA: Diagnosis not present

## 2023-10-21 DIAGNOSIS — Z79899 Other long term (current) drug therapy: Secondary | ICD-10-CM | POA: Diagnosis not present

## 2023-10-21 DIAGNOSIS — E039 Hypothyroidism, unspecified: Secondary | ICD-10-CM | POA: Insufficient documentation

## 2023-10-21 SURGERY — ARTHROPLASTY, SHOULDER, TOTAL, REVERSE
Anesthesia: Regional | Site: Shoulder | Laterality: Left

## 2023-10-21 MED ORDER — CELECOXIB 100 MG PO CAPS: 100.0000 mg | ORAL_CAPSULE | Freq: Two times a day (BID) | ORAL | 0 refills | Status: AC

## 2023-10-21 MED ORDER — ACETAMINOPHEN 10 MG/ML IV SOLN: 1000.0000 mg | Freq: Once | INTRAVENOUS | Status: DC | PRN

## 2023-10-21 MED ORDER — BUPIVACAINE LIPOSOME 1.3 % IJ SUSP
INTRAMUSCULAR | Status: DC | PRN
  Administered 2023-10-21: 10 mL via PERINEURAL

## 2023-10-21 MED ORDER — FENTANYL CITRATE (PF) 100 MCG/2ML IJ SOLN
INTRAMUSCULAR | Status: AC
Start: 2023-10-21 — End: ?
  Filled 2023-10-21: qty 2

## 2023-10-21 MED ORDER — TRAMADOL HCL 50 MG PO TABS
ORAL_TABLET | ORAL | Status: AC
  Filled 2023-10-21: qty 1

## 2023-10-21 MED ORDER — ORAL CARE MOUTH RINSE
15.0000 mL | Freq: Once | OROMUCOSAL | Status: DC
Start: 2023-10-21 — End: 2023-10-21

## 2023-10-21 MED ORDER — FENTANYL CITRATE PF 50 MCG/ML IJ SOSY
25.0000 ug | PREFILLED_SYRINGE | INTRAMUSCULAR | Status: DC | PRN
  Administered 2023-10-21 (×2): 50 ug via INTRAVENOUS

## 2023-10-21 MED ORDER — PROPOFOL 10 MG/ML IV BOLUS
INTRAVENOUS | Status: AC
  Filled 2023-10-21: qty 20

## 2023-10-21 MED ORDER — ROCURONIUM BROMIDE 100 MG/10ML IV SOLN
INTRAVENOUS | Status: DC | PRN
  Administered 2023-10-21: 40 mg via INTRAVENOUS

## 2023-10-21 MED ORDER — ACETAMINOPHEN 500 MG PO TABS
1000.0000 mg | ORAL_TABLET | Freq: Once | ORAL | Status: AC
  Administered 2023-10-21: 1000 mg via ORAL
  Filled 2023-10-21: qty 2

## 2023-10-21 MED ORDER — DEXAMETHASONE SODIUM PHOSPHATE 10 MG/ML IJ SOLN
INTRAMUSCULAR | Status: AC
  Filled 2023-10-21: qty 1

## 2023-10-21 MED ORDER — VANCOMYCIN HCL 1000 MG IV SOLR
INTRAVENOUS | Status: AC
  Filled 2023-10-21: qty 20

## 2023-10-21 MED ORDER — PHENYLEPHRINE HCL-NACL 20-0.9 MG/250ML-% IV SOLN
INTRAVENOUS | Status: DC | PRN
  Administered 2023-10-21: 50 ug/min via INTRAVENOUS

## 2023-10-21 MED ORDER — DEXMEDETOMIDINE HCL IN NACL 80 MCG/20ML IV SOLN
INTRAVENOUS | Status: AC
  Filled 2023-10-21: qty 20

## 2023-10-21 MED ORDER — 0.9 % SODIUM CHLORIDE (POUR BTL) OPTIME
TOPICAL | Status: DC | PRN
  Administered 2023-10-21: 1000 mL

## 2023-10-21 MED ORDER — PROMETHAZINE HCL 25 MG PO TABS: 25.0000 mg | ORAL_TABLET | Freq: Four times a day (QID) | ORAL | 0 refills | Status: DC | PRN

## 2023-10-21 MED ORDER — CEFAZOLIN SODIUM-DEXTROSE 2-4 GM/100ML-% IV SOLN
2.0000 g | INTRAVENOUS | Status: AC
  Administered 2023-10-21: 2 g via INTRAVENOUS
  Filled 2023-10-21: qty 100

## 2023-10-21 MED ORDER — LACTATED RINGERS IV SOLN: INTRAVENOUS | Status: DC

## 2023-10-21 MED ORDER — DEXAMETHASONE SODIUM PHOSPHATE 10 MG/ML IJ SOLN
INTRAMUSCULAR | Status: DC | PRN
  Administered 2023-10-21: 8 mg via INTRAVENOUS

## 2023-10-21 MED ORDER — TRANEXAMIC ACID-NACL 1000-0.7 MG/100ML-% IV SOLN
1000.0000 mg | INTRAVENOUS | Status: AC
  Administered 2023-10-21: 1000 mg via INTRAVENOUS
  Filled 2023-10-21: qty 100

## 2023-10-21 MED ORDER — TRAMADOL HCL 50 MG PO TABS: 50.0000 mg | ORAL_TABLET | Freq: Four times a day (QID) | ORAL | 0 refills | Status: DC | PRN

## 2023-10-21 MED ORDER — VANCOMYCIN HCL 1 G IV SOLR
INTRAVENOUS | Status: DC | PRN
  Administered 2023-10-21: 1000 mg via TOPICAL

## 2023-10-21 MED ORDER — ACETAMINOPHEN 500 MG PO TABS: 1000.0000 mg | ORAL_TABLET | Freq: Three times a day (TID) | ORAL | 0 refills | Status: AC

## 2023-10-21 MED ORDER — LIDOCAINE HCL (PF) 2 % IJ SOLN
INTRAMUSCULAR | Status: DC | PRN
  Administered 2023-10-21: 80 mg via INTRADERMAL

## 2023-10-21 MED ORDER — ONDANSETRON HCL 4 MG/2ML IJ SOLN
INTRAMUSCULAR | Status: DC | PRN
  Administered 2023-10-21: 4 mg via INTRAVENOUS

## 2023-10-21 MED ORDER — SODIUM CHLORIDE 0.9 % IR SOLN
Status: DC | PRN
  Administered 2023-10-21: 1000 mL

## 2023-10-21 MED ORDER — SUGAMMADEX SODIUM 200 MG/2ML IV SOLN
INTRAVENOUS | Status: DC | PRN
  Administered 2023-10-21: 200 mg via INTRAVENOUS

## 2023-10-21 MED ORDER — MIDAZOLAM HCL 2 MG/2ML IJ SOLN
1.0000 mg | Freq: Once | INTRAMUSCULAR | Status: AC
  Administered 2023-10-21: 2 mg via INTRAVENOUS
  Filled 2023-10-21: qty 2

## 2023-10-21 MED ORDER — CHLORHEXIDINE GLUCONATE 0.12 % MT SOLN: 15.0000 mL | Freq: Once | OROMUCOSAL | Status: DC

## 2023-10-21 MED ORDER — FENTANYL CITRATE PF 50 MCG/ML IJ SOSY
50.0000 ug | PREFILLED_SYRINGE | Freq: Once | INTRAMUSCULAR | Status: AC
  Administered 2023-10-21: 50 ug via INTRAVENOUS
  Filled 2023-10-21: qty 2

## 2023-10-21 MED ORDER — ROCURONIUM BROMIDE 10 MG/ML (PF) SYRINGE
PREFILLED_SYRINGE | INTRAVENOUS | Status: AC
  Filled 2023-10-21: qty 10

## 2023-10-21 MED ORDER — TRAMADOL HCL 50 MG PO TABS
50.0000 mg | ORAL_TABLET | Freq: Once | ORAL | Status: AC
  Administered 2023-10-21: 50 mg via ORAL

## 2023-10-21 MED ORDER — PHENYLEPHRINE 80 MCG/ML (10ML) SYRINGE FOR IV PUSH (FOR BLOOD PRESSURE SUPPORT)
PREFILLED_SYRINGE | INTRAVENOUS | Status: DC | PRN
  Administered 2023-10-21: 240 ug via INTRAVENOUS
  Administered 2023-10-21: 80 ug via INTRAVENOUS
  Administered 2023-10-21: 160 ug via INTRAVENOUS

## 2023-10-21 MED ORDER — PROPOFOL 10 MG/ML IV BOLUS
INTRAVENOUS | Status: DC | PRN
  Administered 2023-10-21: 130 mg via INTRAVENOUS

## 2023-10-21 MED ORDER — DEXMEDETOMIDINE HCL IN NACL 80 MCG/20ML IV SOLN
INTRAVENOUS | Status: DC | PRN
  Administered 2023-10-21: 8 ug via INTRAVENOUS

## 2023-10-21 MED ORDER — ONDANSETRON HCL 4 MG/2ML IJ SOLN
INTRAMUSCULAR | Status: AC
  Filled 2023-10-21: qty 2

## 2023-10-21 MED ORDER — LIDOCAINE HCL (PF) 2 % IJ SOLN
INTRAMUSCULAR | Status: AC
  Filled 2023-10-21: qty 5

## 2023-10-21 MED ORDER — ASPIRIN 81 MG PO CHEW: 81.0000 mg | CHEWABLE_TABLET | Freq: Two times a day (BID) | ORAL | 0 refills | Status: AC

## 2023-10-21 MED ORDER — FENTANYL CITRATE PF 50 MCG/ML IJ SOSY
PREFILLED_SYRINGE | INTRAMUSCULAR | Status: AC
  Filled 2023-10-21: qty 2

## 2023-10-21 MED ORDER — ONDANSETRON HCL 4 MG/2ML IJ SOLN: 4.0000 mg | Freq: Once | INTRAMUSCULAR | Status: DC | PRN

## 2023-10-21 MED ORDER — BUPIVACAINE HCL (PF) 0.5 % IJ SOLN
INTRAMUSCULAR | Status: DC | PRN
Start: 2023-10-21 — End: 2023-10-21
  Administered 2023-10-21: 15 mL

## 2023-10-21 MED ORDER — PHENYLEPHRINE HCL-NACL 20-0.9 MG/250ML-% IV SOLN
INTRAVENOUS | Status: AC
  Filled 2023-10-21: qty 250

## 2023-10-21 SURGICAL SUPPLY — 55 items
AUGMENT BASEPLATE 15DEG 25 WDG (Joint) IMPLANT
BAG COUNTER SPONGE SURGICOUNT (BAG) ×2 IMPLANT
BIT DRILL 3.2 PERIPHERAL SCREW (BIT) IMPLANT
BLADE SAW SGTL 73X25 THK (BLADE) ×2 IMPLANT
CHLORAPREP W/TINT 26 (MISCELLANEOUS) ×4 IMPLANT
CLSR STERI-STRIP ANTIMIC 1/2X4 (GAUZE/BANDAGES/DRESSINGS) ×2 IMPLANT
COOLER ICEMAN CLASSIC (MISCELLANEOUS) IMPLANT
COVER BACK TABLE 60X90IN (DRAPES) ×2 IMPLANT
COVER SURGICAL LIGHT HANDLE (MISCELLANEOUS) ×2 IMPLANT
CUP HUM PERFORM 1/2 36 +6 (Joint) IMPLANT
DRAPE C-ARM 42X120 X-RAY (DRAPES) IMPLANT
DRAPE INCISE IOBAN 66X45 STRL (DRAPES) ×2 IMPLANT
DRAPE POUCH INSTRU U-SHP 10X18 (DRAPES) ×2 IMPLANT
DRAPE SHEET LG 3/4 BI-LAMINATE (DRAPES) ×4 IMPLANT
DRAPE SURG ORHT 6 SPLT 77X108 (DRAPES) ×4 IMPLANT
DRAPE TOP 10253 STERILE (DRAPES) IMPLANT
DRESSING AQUACEL AG SP 3.5X6 (GAUZE/BANDAGES/DRESSINGS) IMPLANT
DRSG AQUACEL AG ADV 3.5X 6 (GAUZE/BANDAGES/DRESSINGS) ×2 IMPLANT
ELECT BLADE TIP CTD 4 INCH (ELECTRODE) ×2 IMPLANT
ELECT PENCIL ROCKER SW 15FT (MISCELLANEOUS) ×2 IMPLANT
ELECT REM PT RETURN 15FT ADLT (MISCELLANEOUS) ×2 IMPLANT
FACESHIELD WRAPAROUND (MASK) ×3 IMPLANT
FACESHIELD WRAPAROUND OR TEAM (MASK) ×6 IMPLANT
GLENOSPHERE REV SHOULDER 36 (Joint) IMPLANT
GLOVE BIO SURGEON STRL SZ 6.5 (GLOVE) ×4 IMPLANT
GLOVE BIOGEL PI IND STRL 6.5 (GLOVE) ×2 IMPLANT
GLOVE BIOGEL PI IND STRL 8 (GLOVE) ×2 IMPLANT
GLOVE ECLIPSE 8.0 STRL XLNG CF (GLOVE) ×4 IMPLANT
GOWN STRL REUS W/ TWL LRG LVL3 (GOWN DISPOSABLE) ×4 IMPLANT
GUIDEWIRE GLENOID 2.5X220 (WIRE) IMPLANT
KIT BASIN OR (CUSTOM PROCEDURE TRAY) ×2 IMPLANT
KIT STABILIZATION SHOULDER (MISCELLANEOUS) ×2 IMPLANT
KIT TURNOVER KIT A (KITS) IMPLANT
MANIFOLD NEPTUNE II (INSTRUMENTS) ×2 IMPLANT
NS IRRIG 1000ML POUR BTL (IV SOLUTION) ×2 IMPLANT
PACK SHOULDER (CUSTOM PROCEDURE TRAY) ×2 IMPLANT
PAD COLD SHLDR WRAP-ON (PAD) IMPLANT
PIN GUIDE 3X75 SHOULDER (PIN) IMPLANT
RESTRAINT HEAD UNIVERSAL NS (MISCELLANEOUS) ×2 IMPLANT
SCREW 5.5X22 (Screw) IMPLANT
SCREW 5.5X26 (Screw) IMPLANT
SCREW BONE 6.5X40 SM (Screw) IMPLANT
SET HNDPC FAN SPRY TIP SCT (DISPOSABLE) ×2 IMPLANT
SPONGE T-LAP 4X18 ~~LOC~~+RFID (SPONGE) ×2 IMPLANT
STEM HUMERAL PLUS LONG SZ2 (Orthopedic Implant) IMPLANT
SUCTION TUBE FRAZIER 12FR DISP (SUCTIONS) IMPLANT
SUT ETHIBOND 2 V 37 (SUTURE) ×2 IMPLANT
SUT ETHIBOND NAB CT1 #1 30IN (SUTURE) ×2 IMPLANT
SUT MNCRL AB 4-0 PS2 18 (SUTURE) ×2 IMPLANT
SUT VIC AB 0 CT1 36 (SUTURE) ×2 IMPLANT
SUT VIC AB 3-0 SH 27X BRD (SUTURE) ×2 IMPLANT
SUTURE FIBERWR #5 38 CONV NDL (SUTURE) ×4 IMPLANT
TOWEL OR 17X26 10 PK STRL BLUE (TOWEL DISPOSABLE) ×2 IMPLANT
TUBE SUCTION HIGH CAP CLEAR NV (SUCTIONS) ×2 IMPLANT
WATER STERILE IRR 1000ML POUR (IV SOLUTION) ×4 IMPLANT

## 2023-10-21 NOTE — Op Note (Signed)
 Orthopaedic Surgery Operative Note (CSN: [id number])  Crystal [name]  June 13, 1951 Date of Surgery: 10/21/2023   Diagnoses:  Left cuff tear arthropathy  Procedure: Left reverse augmented total Shoulder Arthroplasty   Operative Finding Successful completion of planned procedure.  Patient had extremely poor bone quality and we had to use a long stem to obtain reasonable fixation.  There is significant posterior and superior erosion.  Good fit with our full wedge implant.  Tug test was unable to be performed secondary to subcoracoid scarring.  Post-operative plan: The patient will be NWB in sling.  The patient will be will be discharged from PACU if continues to be stable as was plan prior to surgery.  DVT prophylaxis Aspirin  81 mg twice daily for 6 weeks.  Pain control with PRN pain medication preferring oral medicines.  Follow up plan will be scheduled in approximately 7 days for incision check and XR.  Physical therapy to start immediately.  Implants: Tornier perform humeral size 2 long stem, +6 retentive polyethylene, 36 standard glenosphere with a 25 full wedge baseplate and a 40 center screw with 4 peripheral screws  Post-Op Diagnosis: Same Surgeons:Primary: Micheline Ahr, MD Assistants:Kirstin Shepperson, PA-C Location: [facility] ROOM 06 Anesthesia: General with Exparel  Interscalene Antibiotics: Ancef  2g preop, Vancomycin  1000mg  locally Tourniquet time: None Estimated Blood Loss: 100 Complications: None Specimens: None Implants: Implant Name Type Inv. Item Serial No. Manufacturer Lot No. LRB No. Used Action  AUGMENT BASEPLATE 15DEG 25 WDG - [id number] Joint AUGMENT BASEPLATE 15DEG 25 WDG [id number] [employer]  Left 1 Implanted  GLENOSPHERE REV SHOULDER 36 - [id number] Joint GLENOSPHERE REV SHOULDER 36 [id number] [employer]  Left 1 Implanted  SCREW BONE 6.5X40 SM - [id number] Screw SCREW BONE 6.5X40 SM  [employer]  Left 1 Implanted  SCREW 5.5X26 - [id number] Screw SCREW  5.5X26  [employer]  Left 1 Implanted  SCREW 5.5X22 - [id number] Screw SCREW 5.5X22  [employer]  Left 1 Implanted  CUP HUM PERFORM 1/2 36 +6 - [id number] Joint CUP HUM PERFORM 1/2 36 +6 [id number] [employer]  Left 1 Implanted  STEM HUMERAL PLUS LONG SZ2 - [id number] Orthopedic Implant STEM HUMERAL PLUS LONG SZ2 [id number] [employer]  Left 1 Implanted    Indications for Surgery:   Crystal [name] is a 73 y.o. female with end-stage cuff tear arthropathy.  Benefits and risks of operative and nonoperative management were discussed prior to surgery with patient/guardian(s) and informed consent form was completed.  Infection and need for further surgery were discussed as was prosthetic stability and cuff issues.  We additionally specifically discussed risks of axillary nerve injury, infection, periprosthetic fracture, continued pain and longevity of implants prior to beginning procedure.      Procedure:   The patient was identified in the preoperative holding area where the surgical site was marked. Block placed by anesthesia with exparel .  The patient was taken to the OR where a procedural timeout was called and the above noted anesthesia was induced.  The patient was positioned beachchair on allen table with spider arm positioner.  Preoperative antibiotics were dosed.  The patient's left shoulder was prepped and draped in the usual sterile fashion.  A second preoperative timeout was called.       Standard deltopectoral approach was performed with a #10 blade. We dissected down to the subcutaneous tissues and the cephalic vein was taken laterally with the deltoid. Clavipectoral fascia was incised in line with the incision. Deep retractors were placed. The long of  the biceps tendon was identified and there was significant tenosynovitis present.  Tenodesis was performed to the pectoralis tendon with #2 Ethibond. The remaining biceps was followed up into the rotator interval where it was  released.   The subscapularis was taken down in a full thickness layer with capsule along the humeral neck extending inferiorly around the humeral head. We continued releasing the capsule directly off of the osteophytes inferiorly all the way around the corner. This allowed us  to dislocate the humeral head.   The humeral head had evidence of severe osteoarthritic wear with full-thickness cartilage loss and exposed subchondral bone. There was significant flattening of the humeral head.   The rotator cuff was carefully examined and noted to be irreperably torn.  The decision was confirmed that a reverse total shoulder was indicated for this patient.  There were osteophytes along the inferior humeral neck. The osteophytes were removed with an osteotome and a rongeur.  Osteophytes were removed with a rongeur and an osteotome and the anatomic neck was well visualized.     A humeral cutting guide was used extra medullary with a pin to help control version. The version was set at 20 of retroversion. Humeral osteotomy was performed with an oscillating saw. The head fragment was passed off the back table.  A cut protector plate was placed.  The subscapularis was again identified and immediately we took care to palpate the axillary nerve anteriorly and verify its position with gentle palpation as well as the tug test.  We then released the SGHL with bovie cautery prior to placing a curved mayo at the junction of the anterior glenoid well above the axillary nerve and bluntly dissecting the subscapularis from the capsule.  We then carefully protected the axillary nerve as we gently released the inferior capsule to fully mobilize the subscapularis.  An anterior deltoid retractor was then placed as well as a small Hohmann retractor superiorly.   The glenoid was inspected and had evidence of severe osteoarthritic wear with full-thickness cartilage loss and exposed subchondral bone.    The remaining labrum was  removed circumferentially taking great care not to disrupt the posterior capsule.   At this point we felt based on blueprint templating that a full wedge augment was necessary.  We began by using a full wedge guide to place our center pin as was templated.  We had good position of this pin and we proceeded with our starter center drill.  This allowed for us  to use the 15 degree full wedge reamer obtaining circumferential witness marks and good bone preparation for ingrowth.  At this point we proceeded with our center drill and had an intact vault.  We then drilled our center screw to a length of 40 mm.    We selected a 6.5 mm x 40 mm screw and the full wedge baseplate which was placed in the same orientation as our reaming.  We double checked that we had good apposition of the base plate to bone and then proceeded to place 3 locking screws and one nonlocking screw as is typical.   Next a 36 mm glenosphere was selected and impacted onto the baseplate. The center screw was tightened.  We turned attention back to the humeral side. The cut protector was removed.  We used the perform humeral sizing block to select the appropriate size which for this patient was a 2.  We then placed our center pin and reamed over it concentrically obtaining appropriate inset.  We then  used our lateralizing chisel to prepare the lateral aspect of the humerus.  At that point we selected the appropriate implant trialing a 2 long.  Using this trial implant we trialed multiple polyethylene sizes settling on a +6 which provided good stability and range of motion without excess soft tissue tension. The offset was dialed in to match the normal anatomy. The shoulder was trialed.  There was good ROM in all planes and the shoulder was stable with no inferior translation.  The real humeral implants were opened after again confirming sizes.  The trial was removed. #5 Fiberwire x4 sutures passed through the humeral neck for subscap repair.  The humeral component was press-fit obtaining a secure fit. The joint was reduced and thoroughly irrigated with pulsatile lavage. Subscap was repaired back with #5 Fiberwire sutures through bone tunnels. Hemostasis was obtained. The deltopectoral interval was reapproximated with #1 Ethibond. The subcutaneous tissues were closed with 2-0 Vicryl and the skin was closed with running monocryl.    The wounds were cleaned and dried and an Aquacel dressing was placed. The drapes taken down. The arm was placed into sling with abduction pillow. Patient was awakened, extubated, and transferred to the recovery room in stable condition. There were no intraoperative complications. The sponge, needle, and attention counts were  correct at the end of the case.     Kirstin Shepperson, PA-C, present and scrubbed throughout the case, critical for completion in a timely fashion, and for retraction, instrumentation, closure.

## 2023-10-21 NOTE — Anesthesia Postprocedure Evaluation (Signed)
 Anesthesia Post Note  Patient: Crystal [name]  Procedure(s) Performed: ARTHROPLASTY, SHOULDER, TOTAL, REVERSE (Left: Shoulder)     Patient location during evaluation: PACU Anesthesia Type: Regional and General Level of consciousness: awake and alert Pain management: pain level controlled Vital Signs Assessment: post-procedure vital signs reviewed and stable Respiratory status: spontaneous breathing, nonlabored ventilation, respiratory function stable and patient connected to nasal cannula oxygen  Cardiovascular status: blood pressure returned to baseline and stable Postop Assessment: no apparent nausea or vomiting Anesthetic complications: no  No notable events documented.  Last Vitals:  Vitals:   10/21/23 1145 10/21/23 1209  BP: (!) 120/54 136/81  Pulse: 79 80  Resp: (!) 21 15  Temp: 36.6 C   SpO2: 95% 92%    Last Pain:  Vitals:   10/21/23 1209  TempSrc:   PainSc: 5                  Rosalita Combe

## 2023-10-21 NOTE — Anesthesia Procedure Notes (Signed)
 Anesthesia Regional Block: Interscalene brachial plexus block   Pre-Anesthetic Checklist: , timeout performed,  Correct Patient, Correct Site, Correct Laterality,  Correct Procedure, Correct Position, site marked,  Risks and benefits discussed,  Surgical consent,  Pre-op evaluation,  At surgeon's request and post-op pain management  Laterality: Upper and Left  Prep: Maximum Sterile Barrier Precautions used, chloraprep       Needles:  Injection technique: Single-shot  Needle Type: Echogenic Needle     Needle Length: 5cm  Needle Gauge: 21     Additional Needles:   Procedures:,,,, ultrasound used (permanent image in chart),,    Narrative:  Start time: 10/21/2023 7:43 AM End time: 10/21/2023 7:49 AM Injection made incrementally with aspirations every 5 mL.  Performed by: Personally  Anesthesiologist: Rosalita Combe, MD  Additional Notes: Block assessed prior to procedure. Patient tolerated procedure well.

## 2023-10-21 NOTE — Anesthesia Procedure Notes (Addendum)
 Procedure Name: Intubation Date/Time: 10/21/2023 8:27 AM  Performed by: Rosalita Combe, MDPre-anesthesia Checklist: Patient identified, Emergency Drugs available, Suction available, Patient being monitored and Timeout performed Patient Re-evaluated:Patient Re-evaluated prior to induction Oxygen  Delivery Method: Circle system utilized Preoxygenation: Pre-oxygenation with 100% oxygen  Induction Type: IV induction Ventilation: Mask ventilation without difficulty Laryngoscope Size: Glidescope and 3 Grade View: Grade I Tube type: Oral Tube size: 7.0 mm Number of attempts: 1 Airway Equipment and Method: Stylet and Video-laryngoscopy Placement Confirmation: ETT inserted through vocal cords under direct vision and breath sounds checked- equal and bilateral Secured at: 21 cm Tube secured with: Tape Dental Injury: Teeth and Oropharynx as per pre-operative assessment

## 2023-10-21 NOTE — Interval H&P Note (Signed)
 All questions answered, patient wants to proceed with procedure. ? ?

## 2023-10-21 NOTE — Transfer of Care (Signed)
 Immediate Anesthesia Transfer of Care Note  Patient: Crystal [name]  Procedure(s) Performed: ARTHROPLASTY, SHOULDER, TOTAL, REVERSE (Left: Shoulder)  Patient Location: PACU  Anesthesia Type:General  Level of Consciousness: awake, patient cooperative, and responds to stimulation  Airway & Oxygen  Therapy: Patient Spontanous Breathing and Patient connected to face mask oxygen   Post-op Assessment: Report given to RN and Post -op Vital signs reviewed and stable  Post vital signs: Reviewed and stable  Last Vitals:  Vitals Value Taken Time  BP 128/73 10/21/23 0949  Temp 36.7 C 10/21/23 0949  Pulse 77 10/21/23 0954  Resp 18 10/21/23 0954  SpO2 95 % 10/21/23 0954  Vitals shown include unfiled device data.  Last Pain:  Vitals:   10/21/23 0750  TempSrc:   PainSc: 0-No pain         Complications: No notable events documented.

## 2023-10-22 ENCOUNTER — Encounter (HOSPITAL_COMMUNITY): Payer: Self-pay | Admitting: Orthopaedic Surgery

## 2023-10-23 ENCOUNTER — Other Ambulatory Visit: Payer: Self-pay

## 2023-10-23 ENCOUNTER — Emergency Department (HOSPITAL_COMMUNITY)
Admission: EM | Admit: 2023-10-23 | Discharge: 2023-10-23 | Disposition: A | Discharge status: Home / Self Care | Attending: Emergency Medicine | Admitting: Emergency Medicine

## 2023-10-23 ENCOUNTER — Encounter (HOSPITAL_COMMUNITY): Payer: Self-pay

## 2023-10-23 ENCOUNTER — Emergency Department (HOSPITAL_COMMUNITY)

## 2023-10-23 DIAGNOSIS — R0602 Shortness of breath: Secondary | ICD-10-CM | POA: Insufficient documentation

## 2023-10-23 DIAGNOSIS — R079 Chest pain, unspecified: Secondary | ICD-10-CM | POA: Diagnosis not present

## 2023-10-23 DIAGNOSIS — R0600 Dyspnea, unspecified: Secondary | ICD-10-CM

## 2023-10-23 DIAGNOSIS — R0789 Other chest pain: Secondary | ICD-10-CM | POA: Diagnosis not present

## 2023-10-23 DIAGNOSIS — Z7982 Long term (current) use of aspirin: Secondary | ICD-10-CM | POA: Insufficient documentation

## 2023-10-23 DIAGNOSIS — R000 Tachycardia, unspecified: Secondary | ICD-10-CM | POA: Diagnosis not present

## 2023-10-23 DIAGNOSIS — R0682 Tachypnea, not elsewhere classified: Secondary | ICD-10-CM | POA: Diagnosis not present

## 2023-10-23 DIAGNOSIS — I10 Essential (primary) hypertension: Secondary | ICD-10-CM | POA: Diagnosis not present

## 2023-10-23 DIAGNOSIS — R0689 Other abnormalities of breathing: Secondary | ICD-10-CM | POA: Diagnosis not present

## 2023-10-23 LAB — I-STAT CHEM 8, ED
BUN: 14 mg/dL (ref 8–23)
Calcium, Ion: 1.07 mmol/L — ABNORMAL LOW (ref 1.15–1.40)
Chloride: 103 mmol/L (ref 98–111)
Creatinine, Ser: 0.7 mg/dL (ref 0.44–1.00)
Glucose, Bld: 81 mg/dL (ref 70–99)
HCT: 36 % (ref 36.0–46.0)
Hemoglobin: 12.2 g/dL (ref 12.0–15.0)
Potassium: 3.6 mmol/L (ref 3.5–5.1)
Sodium: 142 mmol/L (ref 135–145)
TCO2: 28 mmol/L (ref 22–32)

## 2023-10-23 LAB — BASIC METABOLIC PANEL WITH GFR
Anion gap: 10 (ref 5–15)
BUN: 13 mg/dL (ref 8–23)
CO2: 27 mmol/L (ref 22–32)
Calcium: 8.7 mg/dL — ABNORMAL LOW (ref 8.9–10.3)
Chloride: 102 mmol/L (ref 98–111)
Creatinine, Ser: 0.75 mg/dL (ref 0.44–1.00)
GFR, Estimated: >60 mL/min (ref >60)
Glucose, Bld: 85 mg/dL (ref 70–99)
Potassium: 3.6 mmol/L (ref 3.5–5.1)
Sodium: 139 mmol/L (ref 135–145)

## 2023-10-23 LAB — CBC WITH DIFFERENTIAL/PLATELET
Abs Immature Granulocytes: 0.06 10*3/uL (ref 0.00–0.07)
Basophils Absolute: 0 10*3/uL (ref 0.0–0.1)
Basophils Relative: 1 %
Eosinophils Absolute: 0.2 10*3/uL (ref 0.0–0.5)
Eosinophils Relative: 3 %
HCT: 37.2 % (ref 36.0–46.0)
Hemoglobin: 12.3 g/dL (ref 12.0–15.0)
Immature Granulocytes: 1 %
Lymphocytes Relative: 18 %
Lymphs Abs: 1.2 10*3/uL (ref 0.7–4.0)
MCH: 31.9 pg (ref 26.0–34.0)
MCHC: 33.1 g/dL (ref 30.0–36.0)
MCV: 96.6 fL (ref 80.0–100.0)
Monocytes Absolute: 0.6 10*3/uL (ref 0.1–1.0)
Monocytes Relative: 9 %
Neutro Abs: 4.6 10*3/uL (ref 1.7–7.7)
Neutrophils Relative %: 68 %
Platelets: 169 10*3/uL (ref 150–400)
RBC: 3.85 MIL/uL — ABNORMAL LOW (ref 3.87–5.11)
RDW: 13.2 % (ref 11.5–15.5)
WBC: 6.6 10*3/uL (ref 4.0–10.5)
nRBC: 0 % (ref 0.0–0.2)

## 2023-10-23 LAB — TROPONIN I (HIGH SENSITIVITY)
Troponin I (High Sensitivity): 3 ng/L (ref <18)
Troponin I (High Sensitivity): 4 ng/L (ref <18)

## 2023-10-23 LAB — BRAIN NATRIURETIC PEPTIDE: B Natriuretic Peptide: 35.6 pg/mL (ref 0.0–100.0)

## 2023-10-23 MED ORDER — FENTANYL CITRATE PF 50 MCG/ML IJ SOSY
50.0000 ug | PREFILLED_SYRINGE | Freq: Once | INTRAMUSCULAR | Status: AC
  Administered 2023-10-23: 50 ug via INTRAVENOUS
  Filled 2023-10-23: qty 1

## 2023-10-23 MED ORDER — IOHEXOL 350 MG/ML SOLN
75.0000 mL | Freq: Once | INTRAVENOUS | Status: AC | PRN
  Administered 2023-10-23: 75 mL via INTRAVENOUS

## 2023-10-23 MED ORDER — ALBUTEROL SULFATE HFA 108 (90 BASE) MCG/ACT IN AERS
2.0000 | INHALATION_SPRAY | RESPIRATORY_TRACT | Status: DC
  Filled 2023-10-23: qty 6.7

## 2023-10-23 NOTE — Discharge Instructions (Addendum)
 Use 2 puffs of the butyryl every 4-6 hours if you have any trouble breathing.  Follow-up with your doctor next week but return here if things get worse

## 2023-10-23 NOTE — ED Notes (Signed)
 Patient transported to CT

## 2023-10-23 NOTE — ED Triage Notes (Addendum)
 Pt bib [facility] coming from [facility]. Pt recently had shoulder injury Wednesday and developed shortness of breath and chest heaviness that has gotten increasingly worse. Pt sent from UC which concerns of potential PE. Pt placed on 2L [place] for comfort. Pt states she notices her chest heaviness worsens when she is walking. Pt on room air in triage. GCS 15.    EMS VS:  142/86 100 HR 97% 2L  110 cbg 20 left hand placed by UC

## 2023-10-23 NOTE — ED Provider Notes (Signed)
 [place] EMERGENCY DEPARTMENT AT [facility] Provider Note   CSN: [id number] Arrival date & time: 10/23/23  1719     History  Chief Complaint  Patient presents with   Shortness of Breath    Crystal [name] is a 73 y.o. female.  73 year old female with history of recent left shoulder surgery presents with increasing shortness of breath.  States that she has had increasing dyspnea on exertion.  Has noticed some chest discomfort which has been persistent.  Denies any pleuritic component to it.  No change in her cough.  Does have a history of bronchiectasis in the past.  Denies any fevers.  No orthopnea.  No prior history of PE.  States has had no recent leg pain or swelling.  Notes that symptoms are better at rest and that she has had decreased activity.  Went to urgent care and sent here for evaluation of possible PE       Home Medications Prior to Admission medications   Medication Sig Start Date End Date Taking? Authorizing Provider  acetaminophen  (TYLENOL ) 500 MG tablet Take 2 tablets (1,000 mg total) by mouth every 8 (eight) hours for 14 days. 10/21/23 11/04/23  McBane, Caroline N, PA-C  acyclovir  (ZOVIRAX ) 400 MG tablet TAKE 1 TABLET BY MOUTH EVERY DAY 04/16/23   Cranford, Tonya, NP  aspirin  (ASPIRIN  CHILDRENS) 81 MG chewable tablet Chew 1 tablet (81 mg total) by mouth 2 (two) times daily. For 6 weeks for DVT prophylaxis after surgery 10/21/23 12/02/23  McBane, Caroline N, PA-C  bisoprolol -hydrochlorothiazide  (ZIAC ) 10-6.25 MG tablet TAKE 1 TABLET BY MOUTH EVERY DAY 07/13/23   Wilkinson, Dana E, FNP  Black Pepper-Turmeric (TURMERIC PLUS BLACK PEPPER EXT PO) Take 1 tablet by mouth daily.    [provider]  buPROPion  (WELLBUTRIN  XL) 300 MG 24 hr tablet Take 1 tablet (300 mg total) by mouth daily for mood, focus & concentration. 03/09/23   Vangie Genet, MD  carboxymethylcellulose (REFRESH PLUS) 0.5 % SOLN Place 1 drop into both eyes 3 (three) times daily  as needed (dry eyes).    [provider]  celecoxib  (CELEBREX ) 100 MG capsule Take 1 capsule (100 mg total) by mouth 2 (two) times daily. For 2 weeks. Then take as needed 10/21/23 11/20/23  McBane, Caroline N, PA-C  clobetasol ointment (TEMOVATE) 0.05 % Apply 1 Application topically 3 (three) times daily as needed (lichen planus). 02/10/22   [provider]  clotrimazole  (MYCELEX ) 10 MG troche Dissolve 1 tablet (10 mg total) in mouth 3 (three) times daily. 10/07/22     dexamethasone  (DECADRON ) 0.5 MG/5ML solution Take 5 mLs (0.5 mg total) by mouth 3 (three) times daily. Patient taking differently: Take 0.5 mg by mouth 2 (two) times daily. 03/09/23   Cranford, Tonya, NP  diclofenac  Sodium (VOLTAREN ) 1 % GEL Apply 2 grams topically daily as needed (pain). 10/17/22   Cranford, Tonya, NP  DULoxetine  (CYMBALTA ) 60 MG capsule Take 1 capsule (60 mg total) by mouth daily for chronic pain. 05/18/23   Wilkinson, Dana E, FNP  fenofibrate  micronized (LOFIBRA) 134 MG capsule Take 1 capsule (134 mg total) by mouth daily before breakfast 10/18/22   Wilkinson, Dana E, FNP  Ferrous Sulfate (IRON PO) Take 25 mg by mouth daily.    [provider]  fluconazole  (DIFLUCAN ) 200 MG tablet Take 200 mg by mouth daily. 09/24/23   [provider]  fluticasone  (FLONASE ) 50 MCG/ACT nasal spray Use  1-2 Sprays  each nostril   1-2 x /  day  as needed 05/05/23   Antonio Baumgarten, NP  gabapentin  (NEURONTIN ) 300 MG capsule Take  1 capsule  3 x /day  or Chronic Pain    (Dx: m79.7)                                               /                                TAKE                                   BY                                  MOUTH 03/05/23   Vangie Genet, MD  Glycerin, Laxative, (FLEET LIQUID GLYCERIN SUPP RE) Place 1 Dose rectally daily as needed (Constipation).    [provider]  levothyroxine  (SYNTHROID ) 88 MCG tablet Take 1 tablet (88 mcg total) by mouth daily on an empty stomach with water   for 30 minutes.  No antacids, calcium , magnesium  for 4 hours. Avoid biotin. 05/18/23   Wilkinson, Dana E, FNP  meclizine  (ANTIVERT ) 25 MG tablet TAKE 1 TABLET (25 MG TOTAL) BY MOUTH 3 (THREE) TIMES DAILY AS NEEDED FOR DIZZINESS. 06/13/18   Vangie Genet, MD  mupirocin ointment (BACTROBAN) 2 % Apply 1 Application topically 3 (three) times daily as needed (lichen planus).    [provider]  neomycin -polymyxin-hydrocortisone (CORTISPORIN) OTIC solution Place 3 drops into both ears daily as needed (ear inflammation).    [provider]  nystatin  cream (MYCOSTATIN ) Apply 1 Application topically 2 (two) times daily. 01/21/23   Cranford, Tonya, NP  omeprazole  (PRILOSEC) 40 MG capsule Take 1 capsule (40 mg total) by mouth daily. 12/20/22   Wilkinson, Dana E, FNP  OVER THE COUNTER MEDICATION Place 1 drop under the tongue daily. D3 5000 units - K2 120 mcg    [provider]  oxybutynin  (DITROPAN ) 5 MG tablet Take 1 tablet 2 x /day fr Excessive Sweating 05/18/23   Vangie Genet, MD  polyethylene glycol (MIRALAX  / GLYCOLAX ) 17 g packet Take 17-51 g by mouth 3 (three) times daily. Take 3 doses daily    [provider]  promethazine  (PHENERGAN ) 25 MG tablet Take 1 tablet (25 mg total) by mouth every 6 (six) hours as needed for nausea or vomiting. 10/21/23   McBane, Lonnell Rob, PA-C  Rectal Protectant-Emollient (CALMOL-4) 76-10 % SUPP Use as directed once to twice daily Patient taking differently: Place 1 Dose rectally daily as needed (Hemorrhoids). 05/13/23   Armbruster, Lendon Queen, MD  Risankizumab -rzaa ([name] ) 360 MG/2.4ML SOCT Inject 1 Cartridge into the skin every 8 (eight) weeks. 08/18/23   Armbruster, Lendon Queen, MD  rOPINIRole  (REQUIP ) 2 MG tablet Take 1 tablet (2 mg total) by mouth 2 (two) times daily AND 2 tablets (4 mg total) at bedtime for restless legs 05/29/23   Cranford, Tonya, NP  tizanidine  (ZANAFLEX ) 2 MG capsule Take 1 capsule (2 mg total) by mouth at bedtime  for muscle spasm. 02/24/23   Wilkinson, Dana E, FNP  traMADol  (ULTRAM ) 50 MG tablet Take 1-2 tablets (50-100 mg total)  by mouth every 6 (six) hours as needed (for SEVERE pain ONLY.). NO REFILLS 10/21/23   McBane, Caroline N, PA-C  Vitamin D -Vitamin K (K2-D3 5000 PO) Take 5,000 Units by mouth daily.    [provider]      Allergies    Methocarbamol , Codeine, Statins, Azathioprine, Other, Oxycodone , Amitriptyline, Erythromycin base, Fish oil, Flax seed [flax seed oil], Flaxseed (linseed), Hydrocodone , Morphine, Nsaids, Nuvigil [armodafinil], Sertraline, Sinemet [carbidopa-levodopa], Sulfa antibiotics, and Tolmetin    Review of Systems   Review of Systems  All other systems reviewed and are negative.   Physical Exam Updated Vital Signs BP (!) 139/93 (BP Location: Right Arm)   Pulse 100   Temp 98 F (36.7 C) (Oral)   Resp (!) 27   Ht 1.6 m (5\' 3" )   Wt 71.9 kg   SpO2 97%   BMI 28.06 kg/m  Physical Exam Vitals and nursing note reviewed.  Constitutional:      General: She is not in acute distress.    Appearance: Normal appearance. She is well-developed. She is not toxic-appearing.  HENT:     Head: Normocephalic and atraumatic.  Eyes:     General: Lids are normal.     Conjunctiva/sclera: Conjunctivae normal.     Pupils: Pupils are equal, round, and reactive to light.  Neck:     Thyroid : No thyroid  mass.     Trachea: No tracheal deviation.  Cardiovascular:     Rate and Rhythm: Normal rate and regular rhythm.     Heart sounds: Normal heart sounds. No murmur heard.    No gallop.  Pulmonary:     Effort: Pulmonary effort is normal. No respiratory distress.     Breath sounds: Normal breath sounds. No stridor. No decreased breath sounds, wheezing, rhonchi or rales.  Abdominal:     General: There is no distension.     Palpations: Abdomen is soft.     Tenderness: There is no abdominal tenderness. There is no rebound.  Musculoskeletal:        General: No tenderness. Normal  range of motion.     Cervical back: Normal range of motion and neck supple.  Skin:    General: Skin is warm and dry.     Findings: No abrasion or rash.  Neurological:     Mental Status: She is alert and oriented to person, place, and time. Mental status is at baseline.     GCS: GCS eye subscore is 4. GCS verbal subscore is 5. GCS motor subscore is 6.     Cranial Nerves: No cranial nerve deficit.     Sensory: No sensory deficit.     Motor: Motor function is intact.  Psychiatric:        Attention and Perception: Attention normal.        Speech: Speech normal.        Behavior: Behavior normal.     ED Results / Procedures / Treatments   Labs (all labs ordered are listed, but only abnormal results are displayed) Labs Reviewed - No data to display  EKG EKG Interpretation Date/Time:  Friday October 23 2023 17:27:22 EDT Ventricular Rate:  99 PR Interval:  182 QRS Duration:  93 QT Interval:  350 QTC Calculation: 450 R Axis:   -13  Text Interpretation: Sinus rhythm Low voltage, precordial leads Consider anterior infarct No significant change since last tracing Confirmed by Lind Repine ([id number]) on 10/23/2023 5:36:25 PM  Radiology No results found.  Procedures Procedures    Medications  Ordered in ED Medications - No data to display  ED Course/ Medical Decision Making/ A&P                                 Medical Decision Making Amount and/or Complexity of Data Reviewed Labs: ordered. Radiology: ordered.  Risk Prescription drug management.   Patient is EKG shows sinus rhythm which is unchanged from prior.  Chest x-ray showed possible developing infiltrate however chest CT was negative for PE or infiltrate.  Patient does have a prior history of bronchiectasis as well as lung scarring.  Suspect that is the etiology of her symptoms.  Troponins x 2 are negative here.  Very low suspicion for ACS.  BNP is normal.  No evidence of CHF.  She has no signs of anemia.  Does not require  oxygen  at this time.  Pulse ox on room air is 96%.  Plan will be for discharge home        Final Clinical Impression(s) / ED Diagnoses Final diagnoses:  None    Rx / DC Orders ED Discharge Orders     None         Lind Repine, MD 10/23/23 2248

## 2023-10-23 NOTE — ED Notes (Signed)
 Pt denies CP but c/o "tightness and pressure."

## 2023-10-28 ENCOUNTER — Ambulatory Visit: Payer: Self-pay | Admitting: Primary Care

## 2023-10-28 ENCOUNTER — Encounter: Payer: Self-pay | Admitting: Emergency Medicine

## 2023-10-28 ENCOUNTER — Ambulatory Visit: Admitting: Emergency Medicine

## 2023-10-28 VITALS — BP 126/85 | HR 69 | Ht 63.0 in | Wt 156.0 lb

## 2023-10-28 DIAGNOSIS — J189 Pneumonia, unspecified organism: Secondary | ICD-10-CM

## 2023-10-28 MED ORDER — DOXYCYCLINE HYCLATE 100 MG PO TABS: 100.0000 mg | ORAL_TABLET | Freq: Two times a day (BID) | ORAL | 0 refills | Status: DC

## 2023-10-28 NOTE — Telephone Encounter (Signed)
 Seeing Dr. Byrum at 3:45 pm today.  Nothing further needed.

## 2023-10-28 NOTE — Telephone Encounter (Signed)
 Copied from CRM [id number]. Topic: Clinical - Red Word Triage >> Oct 28, 2023  2:20 PM Tyronne Galloway wrote: Red Word that prompted transfer to Nurse Triage: Pt stated she has had shortness of breath since last Thursday, went to urgent care on Friday and the ER on Friday as well for shortness of breath and fatigue.  TRIAGE SUMMARY NOTE: Pt reporting that she had surgery to shoulder on Wednesday then was struggling to breathe and went to UC then transferred to ED via EMS on Friday, ED ruled out PE, scans showed scarring to lung. Pt reporting that her SOB, weakness/fatigue, and intermittent lightheadedness have continued after discharge but have been "better each day" since ED. Pt confirms no chest pain or too weak to stand or walk, pt confirms she is walking well. Pt still getting SOB to point of speaking in phrases when speaking for long time, feels her legs may give out at times, some hoarseness, unsure if wheezing. Pt reporting that she was given albuterol  inhaler sample from ED but "not sure when to use it," no order or info about albuterol  inhaler in chart or ED notes. Advised pt update her surgeon and see about their recommendations, also advised pt be examined in next 4 hours for her symptoms, scheduled available appt with pulm in next 1 hour. Advised call back or seek immediate care with 911 if worsening or new symptoms.  E2C2 Pulmonary Triage - Initial Assessment Questions "Chief Complaint (e.g., cough, sob, wheezing, fever, chills, sweat or additional symptoms) *Go to specific symptom protocol after initial questions. Dry cough on and off, been trying to get tessalon  pearles, even before surgery, family doc passed away and see new PCP tomorrow Not had SOB like this [Friday] before Couldn't talk without being out of breath after surgery before ED, went Friday to UC and they sent ambulance to ED, ruled out PE, looks like left lung had some scarring or density in upper part of right lung Feeling much better  than when at ED, still feel weak and voice goes hoarse after a while Have made some sounds but doing it myself, little bit of whistle sounds, but can change position of my throat to make it, may be intermittently wheezing? No chest pains but do get lightheaded, not like going to pass out Walking well but times where legs feel like going to give out, been run down since last August when had covid been tired, had e.Coli in February, then surgery, also Crohn's Weaker than normal though with surgery and this SOB but improved since ED Talking wears me out and start breathing heavier, was breathing every word almost Not that severe now If talking a long time, can't finish a sentence and have to stop, speaking in phrases Confirms SOB while sitting or laying down, not extreme but still No fever Legs give out underneath me Just gave albuterol  inhaler, didn't prescribe but gave her the inhaler to take home, doesn't know when to use it, no orders in chart Better each day since ED with SOB and better SOB after last surgery too but this time was worse not sure if was the anesthesia  "How long have symptoms been present?" Surgery on Wednesday then got home had problems, reverse ball shoulder surgery on left arm  Reason for Disposition  [1] Longstanding difficulty breathing (e.g., CHF, COPD, emphysema) AND [2] WORSE than normal  Protocols used: Breathing Difficulty-A-AH

## 2023-10-28 NOTE — Progress Notes (Signed)
 Subjective:    Patient ID: Crystal [name], female    DOB: 1951-05-09, 73 y.o.   MRN: [id number]  HPI 73 year old never smoker with a history of CAD, hypertension, CKD, fibromyalgia, GERD, restless leg syndrome, OSA not on CPAP.  She is followed by Dr. Linder Revere in our office for obstructive sleep apnea, rhinitis, bronchiectasis.  She has an inspire device.  She saw Dr. Linder Revere 4/21.  She was in the emergency department 10/23/2023 following a left shoulder surgery.  She was having increased shortness of breath, dyspnea on exertion and persistent chest discomfort. Post-op she had some nausea, mild emesis.  She went to the emergency room for CT chest. She has a cough, persistent SOB.   CT scan of the chest 10/23/23 reviewed by me showed no evidence of pulmonary embolism, no mediastinal or hilar adenopathy, lingular and left lower lobe consolidation    Review of Systems As per HPI  Past Medical History:  Diagnosis Date   Allergy  1966   Anemia    Anxiety 2024   Nerve pain   Arthritis    ASCVD (arteriosclerotic cardiovascular disease)    Blood transfusion without reported diagnosis    Hospital-surgery   Cataract 2000's   Surgery   Chronic kidney disease 2020's   [facility]   Closed nondisplaced subtrochanteric fracture of left femur [facility]) 09/03/2017   September 2018.  Status post surgery by Dr. 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   TIA   Sleep  apnea    does not use CPAP   Spinal headache    with the C section   Stroke (HCC)    Hx TIAs x 3   last was around 2014   TIA (transient ischemic attack)    3         Ulcer    One   Vitamin D  deficiency      Family History  Problem Relation Age of Onset   Cancer Mother    Diabetes Mother    Osteoporosis Mother    Breast cancer Mother    Arthritis Mother    Miscarriages / [place] Mother    Obesity Mother    Heart disease Father    Hypertension Father    Hyperlipidemia Father    Neuropathy Father    Stroke Father    Leukemia Sister    Lupus Sister    Early death Sister    Miscarriages / [place] Sister    Depression Sister    Rheum arthritis Sister    Cancer Sister    Miscarriages / [place] Sister    Migraines Daughter    Cancer Daughter    Cancer Son    Early death Son    Liver disease Neg Hx    Colon cancer Neg Hx    Esophageal cancer Neg Hx  Social History   Socioeconomic History   Marital status: Married    Spouse name: Not on file   Number of children: 2   Years of education: Not on file   Highest education level: Bachelor's degree (e.g., BA, AB, BS)  Occupational History   Occupation: Retired Advertising account executive: DISABLED  Tobacco Use   Smoking status: Never    Passive exposure: Past   Smokeless tobacco: Never  Vaping Use   Vaping status: Never Used  Substance and Sexual Activity   Alcohol use: Yes    Alcohol/week: 4.0 standard drinks of alcohol    Types: 2 Cans of beer, 2 Standard drinks or equivalent per week    Comment: On and off   Drug use: No   Sexual activity: Not Currently    Birth control/protection: None    Comment: Hysterectomy  Other Topics Concern   Not on file  Social History Narrative   Drinks about 1 cup of coffee a day, occasional coke zero    Social Drivers of Corporate investment banker Strain: Low Risk  (08/23/2023)   Overall Financial Resource Strain (CARDIA)    Difficulty of Paying Living  Expenses: Not hard at all  Food Insecurity: No Food Insecurity (08/23/2023)   Hunger Vital Sign    Worried About Running Out of Food in the Last Year: Never true    Ran Out of Food in the Last Year: Never true  Transportation Needs: No Transportation Needs (08/23/2023)   PRAPARE - Transportation    Lack of Transportation (Medical): No    Lack of Transportation (Non-Medical): No  Physical Activity: Unknown (08/23/2023)   Exercise Vital Sign    Days of Exercise per Week: Patient declined    Minutes of Exercise per Session: Not on file  Stress: Stress Concern Present (08/23/2023)   [employer] of Occupational Health - Occupational Stress Questionnaire    Feeling of Stress : To some extent  Social Connections: Unknown (08/23/2023)   Social Connection and Isolation Panel [NHANES]    Frequency of Communication with Friends and Family: More than three times a week    Frequency of Social Gatherings with Friends and Family: Once a week    Attends Religious Services: Patient declined    Database administrator or Organizations: Yes    Attends Engineer, structural: More than 4 times per year    Marital Status: Married  Catering manager Violence: Not on file     Allergies  Allergen Reactions   Methocarbamol  Shortness Of Breath, Nausea Only and Other (See Comments)    Other reaction(s): Dizziness, psychological reaction, disoriented Sleepy and weight loss    Codeine Other (See Comments)    Headache   Statins Other (See Comments)    Extreme pain   Azathioprine    Other Other (See Comments)    MINT=sore mouth and stomach upset   Oxycodone      Dizziness, unbalanced.   Amitriptyline Other (See Comments)    Confusion and hallucinations   Erythromycin Base Rash    Flushing/red face.   Fish Oil Nausea Only   Flax Seed [Flax Seed Oil] Rash   Flaxseed (Linseed) Rash   Hydrocodone  Itching and Rash        Morphine Nausea And Vomiting and Rash   Nsaids Nausea Only   Nuvigil  [Armodafinil] Other (See Comments)    Unspecified   Sertraline Rash   Sinemet [Carbidopa-Levodopa] Rash   Sulfa Antibiotics Rash  Tolmetin Nausea Only     Outpatient Medications Prior to Visit  Medication Sig Dispense Refill   acetaminophen  (TYLENOL ) 500 MG tablet Take 2 tablets (1,000 mg total) by mouth every 8 (eight) hours for 14 days. 84 tablet 0   acyclovir  (ZOVIRAX ) 400 MG tablet TAKE 1 TABLET BY MOUTH EVERY DAY 90 tablet 1   aspirin  (ASPIRIN  CHILDRENS) 81 MG chewable tablet Chew 1 tablet (81 mg total) by mouth 2 (two) times daily. For 6 weeks for DVT prophylaxis after surgery 84 tablet 0   Black Pepper-Turmeric (TURMERIC PLUS BLACK PEPPER EXT PO) Take 1 tablet by mouth daily.     buPROPion  (WELLBUTRIN  XL) 300 MG 24 hr tablet Take 1 tablet (300 mg total) by mouth daily for mood, focus & concentration. 90 tablet 3   carboxymethylcellulose (REFRESH PLUS) 0.5 % SOLN Place 1 drop into both eyes 3 (three) times daily as needed (dry eyes).     celecoxib  (CELEBREX ) 100 MG capsule Take 1 capsule (100 mg total) by mouth 2 (two) times daily. For 2 weeks. Then take as needed 60 capsule 0   clobetasol ointment (TEMOVATE) 0.05 % Apply 1 Application topically 3 (three) times daily as needed (lichen planus).     clotrimazole  (MYCELEX ) 10 MG troche Dissolve 1 tablet (10 mg total) in mouth 3 (three) times daily. 140 Troche 11   dexamethasone  (DECADRON ) 0.5 MG/5ML solution Take 5 mLs (0.5 mg total) by mouth 3 (three) times daily. (Patient taking differently: Take 0.5 mg by mouth 2 (two) times daily.) 100 mL 1   diclofenac  Sodium (VOLTAREN ) 1 % GEL Apply 2 grams topically daily as needed (pain). 100 g 1   DULoxetine  (CYMBALTA ) 60 MG capsule Take 1 capsule (60 mg total) by mouth daily for chronic pain. 90 capsule 3   Ferrous Sulfate (IRON PO) Take 25 mg by mouth daily.     fluconazole  (DIFLUCAN ) 200 MG tablet Take 200 mg by mouth daily. (Patient not taking: Reported on 10/29/2023)     fluticasone  (FLONASE )  50 MCG/ACT nasal spray Use  1-2 Sprays  each nostril   1-2 x /day  as needed 16 g 5   gabapentin  (NEURONTIN ) 300 MG capsule Take  1 capsule  3 x /day  or Chronic Pain    (Dx: m79.7)                                               /                                TAKE                                   BY                                  MOUTH     Glycerin, Laxative, (FLEET LIQUID GLYCERIN SUPP RE) Place 1 Dose rectally daily as needed (Constipation).     levothyroxine  (SYNTHROID ) 88 MCG tablet Take 1 tablet (88 mcg total) by mouth daily on an empty stomach with water  for 30 minutes.  No antacids, calcium , magnesium  for 4 hours. Avoid biotin. 90 tablet 3  meclizine  (ANTIVERT ) 25 MG tablet TAKE 1 TABLET (25 MG TOTAL) BY MOUTH 3 (THREE) TIMES DAILY AS NEEDED FOR DIZZINESS. 90 tablet 0   mupirocin ointment (BACTROBAN) 2 % Apply 1 Application topically 3 (three) times daily as needed (lichen planus).     neomycin -polymyxin-hydrocortisone (CORTISPORIN) OTIC solution Place 3 drops into both ears daily as needed (ear inflammation).     omeprazole  (PRILOSEC) 40 MG capsule Take 1 capsule (40 mg total) by mouth daily. 90 capsule 3   OVER THE COUNTER MEDICATION Place 1 drop under the tongue daily. D3 5000 units - K2 120 mcg     polyethylene glycol (MIRALAX  / GLYCOLAX ) 17 g packet Take 17-51 g by mouth 3 (three) times daily. Take 3 doses daily     promethazine  (PHENERGAN ) 25 MG tablet Take 1 tablet (25 mg total) by mouth every 6 (six) hours as needed for nausea or vomiting. 10 tablet 0   Rectal Protectant-Emollient (CALMOL-4) 76-10 % SUPP Use as directed once to twice daily (Patient taking differently: Place 1 Dose rectally daily as needed (Hemorrhoids).)     Risankizumab -rzaa ([name] ) 360 MG/2.4ML SOCT Inject 1 Cartridge into the skin every 8 (eight) weeks. 2.4 mL 5   rOPINIRole  (REQUIP ) 2 MG tablet Take 1 tablet (2 mg total) by mouth 2 (two) times daily AND 2 tablets (4 mg total) at bedtime for restless legs 360  tablet 3   Vitamin D -Vitamin K (K2-D3 5000 PO) Take 5,000 Units by mouth daily.     bisoprolol -hydrochlorothiazide  (ZIAC ) 10-6.25 MG tablet TAKE 1 TABLET BY MOUTH EVERY DAY 90 tablet 2   fenofibrate  micronized (LOFIBRA) 134 MG capsule Take 1 capsule (134 mg total) by mouth daily before breakfast 90 capsule 3   oxybutynin  (DITROPAN ) 5 MG tablet Take 1 tablet 2 x /day fr Excessive Sweating 180 tablet 3   tizanidine  (ZANAFLEX ) 2 MG capsule Take 1 capsule (2 mg total) by mouth at bedtime for muscle spasm. 90 capsule 3   traMADol  (ULTRAM ) 50 MG tablet Take 1-2 tablets (50-100 mg total) by mouth every 6 (six) hours as needed (for SEVERE pain ONLY.). NO REFILLS 40 tablet 0   nystatin  cream (MYCOSTATIN ) Apply 1 Application topically 2 (two) times daily. 30 g 0   No facility-administered medications prior to visit.        Objective:   Physical Exam  Vitals:   10/28/23 1607 10/28/23 1608  BP: (!) 134/94 126/85  Pulse: 69   SpO2: 97%   Weight: 156 lb (70.8 kg)   Height: 5\' 3"  (1.6 m)    Gen: Pleasant, well-nourished, in no distress,  normal affect  ENT: No lesions,  mouth clear,  oropharynx clear, no postnasal drip  Neck: No JVD, no stridor  Lungs: No use of accessory muscles, scattered rhonchi on the left, distant but clear on the right  Cardiovascular: RRR, heart sounds normal, no murmur or gallops, no peripheral edem  Musculoskeletal: No deformities, no cyanosis or clubbing  Neuro: alert, awake, non focal  Skin: Warm, no lesions or rash     Assessment & Plan:  CAP (community acquired pneumonia) Evaluated in the emergency department with dyspnea and cough.  Her CT scan of the chest did not show any evidence for PE but she does have what look like a left lower lobe and lingular pneumonia.  She has not been treated, we will start doxycycline  now and follow her for improvement.  We reviewed your CT scan of the chest from the emergency department today. We  need to treat you for a  left lung pneumonia.  Please take doxycycline  100 mg twice a day for 7 days. Okay to use albuterol  2 puffs up to every 4 hours if needed for shortness of breath Follow-up in our office with APP in about 2 weeks so we can ensure that you are improving. You will need a repeat chest x-ray about 6 weeks after your CT scan that was done on 4/25    Racheal Buddle, MD, PhD 10/29/2023, 12:34 PM [facility] [phone] or if no answer before 7:00PM call [phone] For any issues after 7:00PM please call eLink [phone]

## 2023-10-28 NOTE — Patient Instructions (Addendum)
 We reviewed your CT scan of the chest from the emergency department today. We need to treat you for a left lung pneumonia.  Please take doxycycline  100 mg twice a day for 7 days. Okay to use albuterol  2 puffs up to every 4 hours if needed for shortness of breath Follow-up in our office with APP in about 2 weeks so we can ensure that you are improving. You will need a repeat chest x-ray about 6 weeks after your CT scan that was done on 4/25

## 2023-10-29 ENCOUNTER — Ambulatory Visit: Payer: 59 | Admitting: Family Medicine

## 2023-10-29 ENCOUNTER — Encounter: Payer: 59 | Admitting: Family Medicine

## 2023-10-29 ENCOUNTER — Encounter: Payer: Self-pay | Admitting: Family Medicine

## 2023-10-29 VITALS — BP 132/88 | HR 110 | Temp 97.3°F | Ht 63.0 in | Wt 156.0 lb

## 2023-10-29 DIAGNOSIS — E039 Hypothyroidism, unspecified: Secondary | ICD-10-CM | POA: Diagnosis not present

## 2023-10-29 DIAGNOSIS — J189 Pneumonia, unspecified organism: Secondary | ICD-10-CM

## 2023-10-29 DIAGNOSIS — I700 Atherosclerosis of aorta: Secondary | ICD-10-CM | POA: Diagnosis not present

## 2023-10-29 DIAGNOSIS — G4733 Obstructive sleep apnea (adult) (pediatric): Secondary | ICD-10-CM

## 2023-10-29 DIAGNOSIS — M797 Fibromyalgia: Secondary | ICD-10-CM | POA: Diagnosis not present

## 2023-10-29 DIAGNOSIS — E1122 Type 2 diabetes mellitus with diabetic chronic kidney disease: Secondary | ICD-10-CM | POA: Diagnosis not present

## 2023-10-29 DIAGNOSIS — K5000 Crohn's disease of small intestine without complications: Secondary | ICD-10-CM

## 2023-10-29 DIAGNOSIS — M1990 Unspecified osteoarthritis, unspecified site: Secondary | ICD-10-CM

## 2023-10-29 DIAGNOSIS — E559 Vitamin D deficiency, unspecified: Secondary | ICD-10-CM | POA: Diagnosis not present

## 2023-10-29 DIAGNOSIS — R61 Generalized hyperhidrosis: Secondary | ICD-10-CM | POA: Diagnosis not present

## 2023-10-29 DIAGNOSIS — I10 Essential (primary) hypertension: Secondary | ICD-10-CM | POA: Diagnosis not present

## 2023-10-29 DIAGNOSIS — G2581 Restless legs syndrome: Secondary | ICD-10-CM

## 2023-10-29 DIAGNOSIS — N3281 Overactive bladder: Secondary | ICD-10-CM | POA: Insufficient documentation

## 2023-10-29 DIAGNOSIS — Z79899 Other long term (current) drug therapy: Secondary | ICD-10-CM

## 2023-10-29 MED ORDER — TRAMADOL HCL 50 MG PO TABS: 50.0000 mg | ORAL_TABLET | Freq: Four times a day (QID) | ORAL | 0 refills | Status: AC | PRN

## 2023-10-29 MED ORDER — FENOFIBRATE 134 MG PO CAPS: 134.0000 mg | ORAL_CAPSULE | Freq: Every day | ORAL | 3 refills | Status: AC

## 2023-10-29 MED ORDER — OXYBUTYNIN CHLORIDE 5 MG PO TABS
ORAL_TABLET | ORAL | 1 refills | Status: DC
Start: 2023-10-29 — End: 2024-04-20

## 2023-10-29 MED ORDER — BISOPROLOL-HYDROCHLOROTHIAZIDE 10-6.25 MG PO TABS: 1.0000 | ORAL_TABLET | Freq: Every day | ORAL | 2 refills | Status: DC

## 2023-10-29 MED ORDER — TIZANIDINE HCL 2 MG PO CAPS
2.0000 mg | ORAL_CAPSULE | Freq: Every day | ORAL | 3 refills | Status: AC
Start: 2023-10-29 — End: ?

## 2023-10-29 NOTE — Assessment & Plan Note (Addendum)
 Diabetes has been diet controlled.  Follow-up in 4 weeks and check A1c, renal function

## 2023-10-29 NOTE — Assessment & Plan Note (Signed)
 She has been on oxybutynin  for this for years and that works well.

## 2023-10-29 NOTE — Assessment & Plan Note (Signed)
 Continue current medication regimen

## 2023-10-29 NOTE — Assessment & Plan Note (Signed)
 Continue current levothyroxine  dose and follow-up in 4 weeks.  We will check TSH at that time.

## 2023-10-29 NOTE — Patient Instructions (Signed)
 Call [facility] for follow up or recommendations regarding bladder issues.

## 2023-10-29 NOTE — Assessment & Plan Note (Signed)
 Close to goal range today.  Continue current medication and low-sodium diet.  Recommend monitoring blood pressure at home.  Follow-up in 4 weeks.

## 2023-10-29 NOTE — Assessment & Plan Note (Signed)
 Reviewed PDMP.  She takes tramadol  sporadically.  Refilled medication

## 2023-10-29 NOTE — Assessment & Plan Note (Signed)
 Evaluated in the emergency department with dyspnea and cough.  Her CT scan of the chest did not show any evidence for PE but she does have what look like a left lower lobe and lingular pneumonia.  She has not been treated, we will start doxycycline  now and follow her for improvement.  We reviewed your CT scan of the chest from the emergency department today. We need to treat you for a left lung pneumonia.  Please take doxycycline  100 mg twice a day for 7 days. Okay to use albuterol  2 puffs up to every 4 hours if needed for shortness of breath Follow-up in our office with APP in about 2 weeks so we can ensure that you are improving. You will need a repeat chest x-ray about 6 weeks after your CT scan that was done on 4/25

## 2023-10-29 NOTE — Assessment & Plan Note (Signed)
 She has inspire.  Followed by pulmonology

## 2023-10-29 NOTE — Progress Notes (Signed)
 New Patient Office Visit  Subjective    Patient ID: Crystal [name], female    DOB: [birth year]  Age: 73 y.o. MRN: [id number]  CC:  Chief Complaint  Patient presents with   Establish Care    Pt went to doctor yesterday. Pt got prenemia before last 2 weeks. Refills med     HPI Crystal [name] presents to establish care Previous PCP Dr. Cassondra Cliff  Last CPE 05/2023  Other providers: Dr. Baldwin Levee - Pulmonology  Dr. General Kenner- GI  Dermatologist  Urologist in the past for urinary incontinence   Diagnosed with LLL pneumonia yesterday.  CT scan of the chest 10/23/23 reviewed showed no evidence of pulmonary embolism, no mediastinal or hilar adenopathy, lingular and left lower lobe consolidation  She is taking Doxycyline prescribed by pulmonology. Has 2 week follow up appt    PMH includes CAD, HTN- taking Ziac  10-6.25, GERD, HLD- fenofibrate  (statin myopathy) and Crohn's disease -taking [name]    Vitamin D  5,000 IUs   OSA - followed by Dr Linder Revere, pulmonology. Not on CPAP. Has inspire   Fibromyalgia -takes duloxetine  daily and gabapentin  3 times daily.  RLS -takes Requip   Left shoulder surgery and arm is in a sling   Osteoarthritis -reports using tramadol  in tizanidine  as needed.  Request refill.  Reports having overactive bladder but originally she was taking oxybutynin  for hyperhidrosis which is effective.    She used to get allergy  shots.    Outpatient Encounter Medications as of 10/29/2023  Medication Sig   acetaminophen  (TYLENOL ) 500 MG tablet Take 2 tablets (1,000 mg total) by mouth every 8 (eight) hours for 14 days.   acyclovir  (ZOVIRAX ) 400 MG tablet TAKE 1 TABLET BY MOUTH EVERY DAY   aspirin  (ASPIRIN  CHILDRENS) 81 MG chewable tablet Chew 1 tablet (81 mg total) by mouth 2 (two) times daily. For 6 weeks for DVT prophylaxis after surgery   Black Pepper-Turmeric (TURMERIC PLUS BLACK PEPPER EXT PO) Take 1 tablet by mouth daily.   buPROPion   (WELLBUTRIN  XL) 300 MG 24 hr tablet Take 1 tablet (300 mg total) by mouth daily for mood, focus & concentration.   carboxymethylcellulose (REFRESH PLUS) 0.5 % SOLN Place 1 drop into both eyes 3 (three) times daily as needed (dry eyes).   celecoxib  (CELEBREX ) 100 MG capsule Take 1 capsule (100 mg total) by mouth 2 (two) times daily. For 2 weeks. Then take as needed   clobetasol ointment (TEMOVATE) 0.05 % Apply 1 Application topically 3 (three) times daily as needed (lichen planus).   clotrimazole  (MYCELEX ) 10 MG troche Dissolve 1 tablet (10 mg total) in mouth 3 (three) times daily.   dexamethasone  (DECADRON ) 0.5 MG/5ML solution Take 5 mLs (0.5 mg total) by mouth 3 (three) times daily. (Patient taking differently: Take 0.5 mg by mouth 2 (two) times daily.)   diclofenac  Sodium (VOLTAREN ) 1 % GEL Apply 2 grams topically daily as needed (pain).   doxycycline  (VIBRA -TABS) 100 MG tablet Take 1 tablet (100 mg total) by mouth 2 (two) times daily.   DULoxetine  (CYMBALTA ) 60 MG capsule Take 1 capsule (60 mg total) by mouth daily for chronic pain.   Ferrous Sulfate (IRON PO) Take 25 mg by mouth daily.   fluticasone  (FLONASE ) 50 MCG/ACT nasal spray Use  1-2 Sprays  each nostril   1-2 x /day  as needed   gabapentin  (NEURONTIN ) 300 MG capsule Take  1 capsule  3 x /day  or Chronic Pain    (Dx: m79.7)                                               /  TAKE                                   BY                                  MOUTH   Glycerin, Laxative, (FLEET LIQUID GLYCERIN SUPP RE) Place 1 Dose rectally daily as needed (Constipation).   levothyroxine  (SYNTHROID ) 88 MCG tablet Take 1 tablet (88 mcg total) by mouth daily on an empty stomach with water  for 30 minutes.  No antacids, calcium , magnesium  for 4 hours. Avoid biotin.   meclizine  (ANTIVERT ) 25 MG tablet TAKE 1 TABLET (25 MG TOTAL) BY MOUTH 3 (THREE) TIMES DAILY AS NEEDED FOR DIZZINESS.   mupirocin ointment (BACTROBAN) 2 % Apply 1  Application topically 3 (three) times daily as needed (lichen planus).   neomycin -polymyxin-hydrocortisone (CORTISPORIN) OTIC solution Place 3 drops into both ears daily as needed (ear inflammation).   nystatin  cream (MYCOSTATIN ) Apply 1 Application topically 2 (two) times daily.   omeprazole  (PRILOSEC) 40 MG capsule Take 1 capsule (40 mg total) by mouth daily.   OVER THE COUNTER MEDICATION Place 1 drop under the tongue daily. D3 5000 units - K2 120 mcg   polyethylene glycol (MIRALAX  / GLYCOLAX ) 17 g packet Take 17-51 g by mouth 3 (three) times daily. Take 3 doses daily   promethazine  (PHENERGAN ) 25 MG tablet Take 1 tablet (25 mg total) by mouth every 6 (six) hours as needed for nausea or vomiting.   Rectal Protectant-Emollient (CALMOL-4) 76-10 % SUPP Use as directed once to twice daily (Patient taking differently: Place 1 Dose rectally daily as needed (Hemorrhoids).)   Risankizumab -rzaa ([name] ) 360 MG/2.4ML SOCT Inject 1 Cartridge into the skin every 8 (eight) weeks.   rOPINIRole  (REQUIP ) 2 MG tablet Take 1 tablet (2 mg total) by mouth 2 (two) times daily AND 2 tablets (4 mg total) at bedtime for restless legs   Vitamin D -Vitamin K (K2-D3 5000 PO) Take 5,000 Units by mouth daily.   [DISCONTINUED] bisoprolol -hydrochlorothiazide  (ZIAC ) 10-6.25 MG tablet TAKE 1 TABLET BY MOUTH EVERY DAY   [DISCONTINUED] fenofibrate  micronized (LOFIBRA) 134 MG capsule Take 1 capsule (134 mg total) by mouth daily before breakfast   [DISCONTINUED] oxybutynin  (DITROPAN ) 5 MG tablet Take 1 tablet 2 x /day fr Excessive Sweating   [DISCONTINUED] tizanidine  (ZANAFLEX ) 2 MG capsule Take 1 capsule (2 mg total) by mouth at bedtime for muscle spasm.   [DISCONTINUED] traMADol  (ULTRAM ) 50 MG tablet Take 1-2 tablets (50-100 mg total) by mouth every 6 (six) hours as needed (for SEVERE pain ONLY.). NO REFILLS   bisoprolol -hydrochlorothiazide  (ZIAC ) 10-6.25 MG tablet Take 1 tablet by mouth daily.   fenofibrate  micronized (LOFIBRA)  134 MG capsule Take 1 capsule (134 mg total) by mouth daily before breakfast   fluconazole  (DIFLUCAN ) 200 MG tablet Take 200 mg by mouth daily. (Patient not taking: Reported on 10/29/2023)   oxybutynin  (DITROPAN ) 5 MG tablet Take 1 tablet 2 x /day fr Excessive Sweating   tizanidine  (ZANAFLEX ) 2 MG capsule Take 1 capsule (2 mg total) by mouth at bedtime for muscle spasm.   traMADol  (ULTRAM ) 50 MG tablet Take 1-2 tablets (50-100 mg total) by mouth every 6 (six) hours as needed (for SEVERE pain ONLY.). NO REFILLS   No facility-administered encounter medications on file as of 10/29/2023.    Past Medical History:  Diagnosis Date   Allergy  1966   Anemia    Anxiety 2024   Nerve pain   Arthritis    ASCVD (arteriosclerotic cardiovascular disease)    Blood transfusion without reported diagnosis    Hospital-surgery   Cataract 2000's   Surgery   Chronic kidney disease 2020's   [facility] long hospital   Closed nondisplaced subtrochanteric fracture of left femur [facility]) 09/03/2017   September 2018.  Status post surgery by Dr. 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   TIA   Sleep apnea    does not use CPAP   Spinal headache    with the C section   Stroke (HCC)    Hx TIAs x 3   last was around 2014   TIA (transient ischemic attack)    3         Ulcer    One   Vitamin D  deficiency     Past Surgical History:  Procedure Laterality Date   ABDOMINAL HYSTERECTOMY  1992    ANTERIOR CERVICAL DECOMPRESSION/DISCECTOMY FUSION 4 LEVELS N/A 06/14/2015   Procedure: Cervical three-four Cervical four-five Cervical five- six Cervical six- seven  Anterior cervical decompression/diskectomy/fusion;  Surgeon: Manya Sells, MD;  Location: [facility];  Service: Neurosurgery;  Laterality: N/A;  C3-4 C4-5 C5-6 C6-7 Anterior cervical decompression/diskectomy/fusion   APPENDECTOMY  1960   BACK SURGERY     surgeries x 3   CATARACT EXTRACTION, BILATERAL     CESAREAN SECTION  1983 & 1886   DRUG INDUCED ENDOSCOPY N/A 02/25/2022   Procedure: DRUG INDUCED SLEEP ENDOSCOPY;  Surgeon: Ammon Bales, MD;  Location: [facility];  Service: ENT;  Laterality: N/A;   EYE SURGERY     bilateral cataracts   FINGER ARTHROSCOPY     FRACTURE SURGERY  2020   Femur   IMPLANTATION OF HYPOGLOSSAL NERVE STIMULATOR Right 05/29/2022   Procedure: IMPLANTATION OF HYPOGLOSSAL NERVE STIMULATOR;  Surgeon: Ammon Bales, MD;  Location: [facility];  Service: ENT;  Laterality: Right;   JOINT REPLACEMENT     2   LEFT  1 RIGHT    KNEE SURGERY     ORIF FEMUR FRACTURE Left 03/24/2017   Procedure: OPEN REDUCTION INTERNAL FIXATION (ORIF) FEMUR FRACTURE;  Surgeon: Hardy Lia, MD;  Location: [facility] OR;  Service: Orthopedics;  Laterality: Left;   REVERSE SHOULDER ARTHROPLASTY Left 10/21/2023   Procedure: ARTHROPLASTY, SHOULDER, TOTAL, REVERSE;  Surgeon: Micheline Ahr, MD;  Location: [facility];  Service: Orthopedics;  Laterality: Left;   ROTATOR CUFF REPAIR Left 2005   2 LEFT  1  RIGHT   SHOULDER ADHESION RELEASE     SPINAL CORD DECOMPRESSION  1998   SPINE SURGERY     TOE SURGERY     LEFT        TOE SURGERY     RIGHT  BIG TOE   TONSILLECTOMY  1960   TRANSFORAMINAL LUMBAR INTERBODY FUSION W/ MIS 1 LEVEL Left 09/05/2021   Procedure: MINIMALLY INVASIVE TRANSFORAMINAL LUMBAR INTERBODY FUSION LUMBAR THREE-FOUR; EXPLORE FUSION WITH REVISION OF HARDWARE LUMBAR FOUR-FIVE,LEFT SIDE APPROACH;  Surgeon: Pincus Bridgeman, DO;  Location: [facility] OR;  Service: Neurosurgery;  Laterality: Left;    Family History  Problem Relation Age of Onset   Cancer Mother    Diabetes Mother    Osteoporosis Mother    Breast cancer Mother    Arthritis Mother    Miscarriages / [place] Mother    Obesity Mother    Heart disease Father    Hypertension Father    Hyperlipidemia Father    Neuropathy Father    Stroke Father    Leukemia Sister    Lupus Sister    Early death Sister    Miscarriages / [place] Sister    Depression Sister    Rheum arthritis Sister    Cancer Sister    Miscarriages / [place] Sister    Migraines Daughter    Cancer Daughter    Cancer Son    Early death Son    Liver disease Neg Hx    Colon cancer Neg Hx    Esophageal cancer Neg Hx     Social History   Socioeconomic History   Marital status: Married    Spouse name: Not on file   Number of children: 2   Years of education: Not on file   Highest education level: Bachelor's degree (e.g., BA, AB, BS)  Occupational History   Occupation: Retired Advertising account executive: DISABLED  Tobacco Use   Smoking status: Never    Passive exposure: Past   Smokeless tobacco: Never  Vaping Use   Vaping status: Never Used  Substance and Sexual Activity   Alcohol use: Yes    Alcohol/week: 4.0 standard drinks of alcohol    Types: 2 Cans of beer, 2 Standard drinks or equivalent per week    Comment: On and off   Drug use: No   Sexual activity: Not Currently    Birth control/protection: None    Comment: Hysterectomy  Other Topics Concern   Not on file  Social History Narrative   Drinks about 1 cup of coffee a day, occasional coke zero    Social Drivers of Corporate investment banker Strain: Low Risk  (08/23/2023)   Overall Financial Resource Strain (CARDIA)    Difficulty of Paying Living Expenses: Not hard at all  Food Insecurity: No Food Insecurity (08/23/2023)   Hunger Vital Sign    Worried About Running Out of Food in the Last  Year: Never true    Ran Out of Food in the Last Year: Never true  Transportation Needs: No Transportation Needs (08/23/2023)   PRAPARE - Transportation    Lack of Transportation (Medical): No    Lack of Transportation (Non-Medical): No  Physical Activity: Unknown (08/23/2023)   Exercise Vital Sign    Days of Exercise per Week: Patient declined    Minutes of Exercise per Session: Not on file  Stress: Stress Concern Present (08/23/2023)   [employer] of Occupational Health - Occupational Stress Questionnaire    Feeling of Stress : To some extent  Social Connections: Unknown (08/23/2023)   Social Connection and Isolation Panel [NHANES]    Frequency of Communication with Friends and Family: More than three times a week    Frequency of Social Gatherings with Friends and Family:  Once a week    Attends Religious Services: Patient declined    Active Member of Clubs or Organizations: Yes    Attends Engineer, structural: More than 4 times per year    Marital Status: Married  Catering manager Violence: Not on file    Review of Systems  Constitutional:  Positive for malaise/fatigue. Negative for chills and fever.  Respiratory:  Positive for cough. Negative for shortness of breath.   Cardiovascular:  Negative for chest pain, palpitations and leg swelling.  Gastrointestinal:  Negative for abdominal pain, constipation, diarrhea, nausea and vomiting.  Neurological:  Negative for dizziness, focal weakness and headaches.        Objective    BP 132/88 (BP Location: Right Arm, Patient Position: Sitting)   Pulse (!) 110   Temp (!) 97.3 F (36.3 C) (Temporal)   Ht 5\' 3"  (1.6 m)   Wt 156 lb (70.8 kg)   SpO2 95%   BMI 27.63 kg/m   Physical Exam Constitutional:      General: She is not in acute distress.    Appearance: She is not ill-appearing.  HENT:     Mouth/Throat:     Mouth: Mucous membranes are moist.  Eyes:     Extraocular Movements: Extraocular movements intact.      Conjunctiva/sclera: Conjunctivae normal.  Cardiovascular:     Rate and Rhythm: Normal rate and regular rhythm.  Pulmonary:     Effort: Pulmonary effort is normal.     Breath sounds: Normal breath sounds.  Musculoskeletal:     Cervical back: Normal range of motion and neck supple.  Skin:    General: Skin is warm and dry.  Neurological:     General: No focal deficit present.     Mental Status: She is alert and oriented to person, place, and time.     Cranial Nerves: No cranial nerve deficit.     Motor: No weakness.  Psychiatric:        Mood and Affect: Mood normal.        Behavior: Behavior normal.        Thought Content: Thought content normal.         Assessment & Plan:   Problem List Items Addressed This Visit     Aortic atherosclerosis (HCC) by Chest CT scan on 09/2017   Relevant Medications   fenofibrate  micronized (LOFIBRA) 134 MG capsule   bisoprolol -hydrochlorothiazide  (ZIAC ) 10-6.25 MG tablet   Arthritis   Reviewed PDMP.  She takes tramadol  sporadically.  Refilled medication      Relevant Medications   tizanidine  (ZANAFLEX ) 2 MG capsule   traMADol  (ULTRAM ) 50 MG tablet   CAP (community acquired pneumonia) - Primary   Reviewed pulmonology note and CT scan results from yesterday.  She is currently taking doxycycline .  No acute distress.  She will follow-up with pulmonology in 2 weeks as recommended.      Crohn's ileitis (HCC)   Starting new medication, [name] , followed closely by Dr. General Kenner.      Essential hypertension   Close to goal range today.  Continue current medication and low-sodium diet.  Recommend monitoring blood pressure at home.  Follow-up in 4 weeks.      Relevant Medications   fenofibrate  micronized (LOFIBRA) 134 MG capsule   bisoprolol -hydrochlorothiazide  (ZIAC ) 10-6.25 MG tablet   Fibromyalgia   Continue current medication regimen.      Relevant Medications   tizanidine  (ZANAFLEX ) 2 MG capsule   traMADol  (ULTRAM ) 50 MG tablet  Hyperhidrosis   She has been on oxybutynin  for this for years and that works well.      Relevant Medications   oxybutynin  (DITROPAN ) 5 MG tablet   Hypothyroid   Continue current levothyroxine  dose and follow-up in 4 weeks.  We will check TSH at that time.      Medication management   Relevant Medications   bisoprolol -hydrochlorothiazide  (ZIAC ) 10-6.25 MG tablet   Obstructive sleep apnea   She has inspire.  Followed by pulmonology      Overactive bladder   RESTLESS LEG SYNDROME   Doing well with Requip       Type 2 diabetes mellitus with chronic kidney disease, without long-term current use of insulin  (HCC)   Diabetes has been diet controlled.  Follow-up in 4 weeks and check A1c, renal function      Relevant Medications   bisoprolol -hydrochlorothiazide  (ZIAC ) 10-6.25 MG tablet   Vitamin D  deficiency   Currently taking 5000 IUs of vitamin D  daily.  Check vitamin D  level at 4-week follow-up.        Return in about 4 weeks (around 11/26/2023) for Fasting follow up.   Alyson Back, NP-C

## 2023-10-29 NOTE — Assessment & Plan Note (Signed)
 Doing well with Requip.

## 2023-10-29 NOTE — Assessment & Plan Note (Signed)
 Reviewed pulmonology note and CT scan results from yesterday.  She is currently taking doxycycline .  No acute distress.  She will follow-up with pulmonology in 2 weeks as recommended.

## 2023-10-29 NOTE — Assessment & Plan Note (Signed)
 Starting new medication, [name] , followed closely by Dr. General Kenner.

## 2023-10-29 NOTE — Assessment & Plan Note (Signed)
 Currently taking 5000 IUs of vitamin D  daily.  Check vitamin D  level at 4-week follow-up.

## 2023-10-30 ENCOUNTER — Encounter: Payer: 59 | Admitting: Family Medicine

## 2023-10-30 DIAGNOSIS — M19012 Primary osteoarthritis, left shoulder: Secondary | ICD-10-CM | POA: Diagnosis not present

## 2023-11-03 DIAGNOSIS — M25512 Pain in left shoulder: Secondary | ICD-10-CM | POA: Diagnosis not present

## 2023-11-04 ENCOUNTER — Encounter: Payer: Self-pay | Admitting: Internal Medicine

## 2023-11-07 ENCOUNTER — Other Ambulatory Visit: Payer: Self-pay | Admitting: Family Medicine

## 2023-11-07 DIAGNOSIS — Z79899 Other long term (current) drug therapy: Secondary | ICD-10-CM

## 2023-11-10 ENCOUNTER — Ambulatory Visit (INDEPENDENT_AMBULATORY_CARE_PROVIDER_SITE_OTHER): Payer: PRIVATE HEALTH INSURANCE | Admitting: Primary Care

## 2023-11-10 ENCOUNTER — Encounter: Payer: Self-pay | Admitting: Primary Care

## 2023-11-10 VITALS — BP 124/66 | HR 102 | Temp 97.3°F | Ht 63.0 in | Wt 160.2 lb

## 2023-11-10 DIAGNOSIS — M25512 Pain in left shoulder: Secondary | ICD-10-CM | POA: Diagnosis not present

## 2023-11-10 DIAGNOSIS — J189 Pneumonia, unspecified organism: Secondary | ICD-10-CM

## 2023-11-10 NOTE — Progress Notes (Signed)
 @Patient  ID: Crystal [name], female    DOB: 04/22/1951, 73 y.o.   MRN: [id number]  Chief Complaint  Patient presents with   Follow-up    F/u on pneumonia     Referring provider: Abram Abraham, NP-C  HPI:  73 year old female, never smoked.  Past medical history significant for hypertension, aortic arthrosclerosis, OSA, bronchiectasis without complication, allergic rhinitis, Crohn's, GERD, chronic kidney disease stage II, arthropathy involving shoulder region, COVID-19 virus, recurrent TIAs, right total knee arthroplasty, lumbar spine stenosis, obesity.  Previous [facility] pulmonary encounter: 10/28/23- Dr. Baldwin Levee  72 year old never smoker with a history of CAD, hypertension, CKD, fibromyalgia, GERD, restless leg syndrome, OSA not on CPAP.  She is followed by Dr. Linder Revere in our office for obstructive sleep apnea, rhinitis, bronchiectasis.  She has an inspire device.  She saw Dr. Linder Revere 4/21.  She was in the emergency department 10/23/2023 following a left shoulder surgery.  She was having increased shortness of breath, dyspnea on exertion and persistent chest discomfort. Post-op she had some nausea, mild emesis.  She went to the emergency room for CT chest. She has a cough, persistent SOB.   CT scan of the chest 10/23/23 reviewed by me showed no evidence of pulmonary embolism, no mediastinal or hilar adenopathy, lingular and left lower lobe consolidation   CAP (community acquired pneumonia) Evaluated in the emergency department with dyspnea and cough.  Her CT scan of the chest did not show any evidence for PE but she does have what look like a left lower lobe and lingular pneumonia.  She has not been treated, we will start doxycycline  now and follow her for improvement.  We reviewed your CT scan of the chest from the emergency department today. We need to treat you for a left lung pneumonia.  Please take doxycycline  100 mg twice a day for 7 days. Okay to use albuterol  2 puffs up to every  4 hours if needed for shortness of breath Follow-up in our office with APP in about 2 weeks so we can ensure that you are improving. You will need a repeat chest x-ray about 6 weeks after your CT scan that was done on 4/25   11/10/2023- Interim hx  Patient presents today for 2 week follow-up for pneumonia Treated with doxycycline  Will needs CXR 6 weeks after CT scan done on 4/25    Discussed the use of AI scribe software for clinical note transcription with the patient, who gave verbal consent to proceed.  History of Present Illness   Crystal [name] "Deatra Face" is a 73 year old female who presents for a two-week follow-up after being treated for pneumonia.  She was initially seen on April 30th, when a CT scan revealed left lower lobe pneumonia. She was prescribed doxycycline  for seven days, which she completed. She feels better after the antibiotic course, although she still experiences a cough and occasional mucus production.  She underwent shoulder surgery on April 23rd, after being cleared for the procedure on April 21st. Postoperatively, she developed symptoms that led her to visit urgent care on April 25th, where she was subsequently sent to the hospital. Despite the hospital visit, she was not given any additional antibiotics at that time.  She continues to experience sweating, which she describes as 'breaking out in a sweat' earlier in the day, although her temperature remains normal at 97.50F. She notes that her body temperature feels off and associates the sweating with her medication intake.  She has not been using her  incentive spirometer recently. No fevers, but she reports sweating and a persistent cough with occasional mucus production.      Allergies  Allergen Reactions   Methocarbamol  Shortness Of Breath, Nausea Only and Other (See Comments)    Other reaction(s): Dizziness, psychological reaction, disoriented Sleepy and weight loss    Codeine Other (See Comments)     Headache   Statins Other (See Comments)    Extreme pain   Azathioprine    Other Other (See Comments)    MINT=sore mouth and stomach upset   Oxycodone      Dizziness, unbalanced.   Amitriptyline Other (See Comments)    Confusion and hallucinations   Erythromycin Base Rash    Flushing/red face.   Fish Oil Nausea Only   Flax Seed [Flax Seed Oil] Rash   Flaxseed (Linseed) Rash   Hydrocodone  Itching and Rash        Morphine Nausea And Vomiting and Rash   Nsaids Nausea Only   Nuvigil [Armodafinil] Other (See Comments)    Unspecified   Sertraline Rash   Sinemet [Carbidopa-Levodopa] Rash   Sulfa Antibiotics Rash   Tolmetin Nausea Only    Immunization History  Administered Date(s) Administered   DT (Pediatric) 01/03/2015   Fluad Quad(high Dose 65+) 04/25/2022, 05/01/2023   Fluad Trivalent(High Dose 65+) 02/19/2018   Influenza Split 03/30/2010, 03/31/2011, 05/14/2014, 02/27/2016, 04/16/2018   Influenza Whole 03/30/2010   Influenza, High Dose Seasonal PF 03/13/2015, 07/21/2019, 02/16/2020, 04/08/2021   Influenza-Unspecified 03/31/2011, 05/14/2014, 02/27/2016, 04/16/2018   PFIZER Comirnaty(Gray Top)Covid-19 Tri-Sucrose Vaccine 09/19/2019, 10/10/2019, 07/13/2020, 05/01/2023   PFIZER(Purple Top)SARS-COV-2 Vaccination 09/19/2019, 10/10/2019   Pfizer Covid-19 Vaccine Bivalent Booster 45yrs & up 05/14/2021   Pneumococcal Conjugate-13 06/30/2005, 04/17/2015, 01/23/2019   Pneumococcal Polysaccharide-23 01/06/2017   Pneumococcal-Unspecified 06/30/2005   Respiratory Syncytial Virus Vaccine,Recomb Aduvanted(Arexvy) 07/01/2022   Td 06/30/2004   Td (Adult),5 Lf Tetanus Toxid, Preservative Free 06/30/2004   Td,absorbed, Preservative Free, Adult Use, Lf Unspecified 06/30/2004   Unspecified SARS-COV-2 Vaccination 04/25/2022   Zoster Recombinant(Shingrix) 05/14/2021, 08/22/2021   Zoster, Live 07/01/2007    Past Medical History:  Diagnosis Date   Allergy  1966   Anemia    Anxiety 2024    Nerve pain   Arthritis    ASCVD (arteriosclerotic cardiovascular disease)    Blood transfusion without reported diagnosis    Hospital-surgery   Cataract 2000's   Surgery   Chronic kidney disease 2020's   [facility]   Closed nondisplaced subtrochanteric fracture of left femur (HCC) 09/03/2017   September 2018.  Status post surgery by Dr. 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   TIA   Sleep apnea    does not use CPAP   Spinal headache    with the C section   Stroke (HCC)    Hx TIAs x 3   last was around 2014  TIA (transient ischemic attack)    3         Ulcer    One   Vitamin D  deficiency     Tobacco History: Social History   Tobacco Use  Smoking Status Never   Passive exposure: Past  Smokeless Tobacco Never   Counseling given: Not Answered   Outpatient Medications Prior to Visit  Medication Sig Dispense Refill   acyclovir  (ZOVIRAX ) 400 MG tablet TAKE 1 TABLET BY MOUTH EVERY DAY 90 tablet 1   aspirin  (ASPIRIN  CHILDRENS) 81 MG chewable tablet Chew 1 tablet (81 mg total) by mouth 2 (two) times daily. For 6 weeks for DVT prophylaxis after surgery 84 tablet 0   bisoprolol -hydrochlorothiazide  (ZIAC ) 10-6.25 MG tablet Take 1 tablet by mouth daily. 90 tablet 2   Black Pepper-Turmeric (TURMERIC PLUS BLACK PEPPER EXT PO) Take 1 tablet by mouth daily.     buPROPion  (WELLBUTRIN  XL) 300 MG 24 hr tablet Take 1 tablet (300 mg total) by mouth daily  for mood, focus & concentration. 90 tablet 3   carboxymethylcellulose (REFRESH PLUS) 0.5 % SOLN Place 1 drop into both eyes 3 (three) times daily as needed (dry eyes).     celecoxib  (CELEBREX ) 100 MG capsule Take 1 capsule (100 mg total) by mouth 2 (two) times daily. For 2 weeks. Then take as needed 60 capsule 0   clobetasol ointment (TEMOVATE) 0.05 % Apply 1 Application topically 3 (three) times daily as needed (lichen planus).     clotrimazole  (MYCELEX ) 10 MG troche Dissolve 1 tablet (10 mg total) in mouth 3 (three) times daily. 140 Troche 11   dexamethasone  (DECADRON ) 0.5 MG/5ML solution Take 5 mLs (0.5 mg total) by mouth 3 (three) times daily. (Patient taking differently: Take 0.5 mg by mouth 2 (two) times daily.) 100 mL 1   diclofenac  Sodium (VOLTAREN ) 1 % GEL Apply 2 grams topically daily as needed (pain). 100 g 1   DULoxetine  (CYMBALTA ) 60 MG capsule Take 1 capsule (60 mg total) by mouth daily for chronic pain. 90 capsule 3   fenofibrate  micronized (LOFIBRA) 134 MG capsule Take 1 capsule (134 mg total) by mouth daily before breakfast 90 capsule 3   Ferrous Sulfate (IRON PO) Take 25 mg by mouth daily.     fluticasone  (FLONASE ) 50 MCG/ACT nasal spray Use  1-2 Sprays  each nostril   1-2 x /day  as needed 16 g 5   gabapentin  (NEURONTIN ) 300 MG capsule Take  1 capsule  3 x /day  or Chronic Pain    (Dx: m79.7)                                               /                                TAKE                                   BY                                  MOUTH     Glycerin, Laxative, (FLEET LIQUID GLYCERIN SUPP RE) Place 1 Dose rectally  daily as needed (Constipation).     levothyroxine  (SYNTHROID ) 88 MCG tablet Take 1 tablet (88 mcg total) by mouth daily on an empty stomach with water  for 30 minutes.  No antacids, calcium , magnesium  for 4 hours. Avoid biotin. 90 tablet 3   meclizine  (ANTIVERT ) 25 MG tablet TAKE 1 TABLET (25 MG TOTAL) BY MOUTH 3 (THREE) TIMES DAILY AS NEEDED FOR DIZZINESS. 90  tablet 0   mupirocin ointment (BACTROBAN) 2 % Apply 1 Application topically 3 (three) times daily as needed (lichen planus).     neomycin -polymyxin-hydrocortisone (CORTISPORIN) OTIC solution Place 3 drops into both ears daily as needed (ear inflammation).     omeprazole  (PRILOSEC) 40 MG capsule Take 1 capsule (40 mg total) by mouth daily. 90 capsule 3   OVER THE COUNTER MEDICATION Place 1 drop under the tongue daily. D3 5000 units - K2 120 mcg     oxybutynin  (DITROPAN ) 5 MG tablet Take 1 tablet 2 x /day fr Excessive Sweating 180 tablet 1   polyethylene glycol (MIRALAX  / GLYCOLAX ) 17 g packet Take 17-51 g by mouth 3 (three) times daily. Take 3 doses daily     promethazine  (PHENERGAN ) 25 MG tablet Take 1 tablet (25 mg total) by mouth every 6 (six) hours as needed for nausea or vomiting. 10 tablet 0   Rectal Protectant-Emollient (CALMOL-4) 76-10 % SUPP Use as directed once to twice daily (Patient taking differently: Place 1 Dose rectally daily as needed (Hemorrhoids).)     Risankizumab -rzaa ([name] ) 360 MG/2.4ML SOCT Inject 1 Cartridge into the skin every 8 (eight) weeks. 2.4 mL 5   rOPINIRole  (REQUIP ) 2 MG tablet Take 1 tablet (2 mg total) by mouth 2 (two) times daily AND 2 tablets (4 mg total) at bedtime for restless legs 360 tablet 3   tizanidine  (ZANAFLEX ) 2 MG capsule Take 1 capsule (2 mg total) by mouth at bedtime for muscle spasm. 90 capsule 3   traMADol  (ULTRAM ) 50 MG tablet Take 1-2 tablets (50-100 mg total) by mouth every 6 (six) hours as needed (for SEVERE pain ONLY.). NO REFILLS 30 tablet 0   Vitamin D -Vitamin K (K2-D3 5000 PO) Take 5,000 Units by mouth daily.     doxycycline  (VIBRA -TABS) 100 MG tablet Take 1 tablet (100 mg total) by mouth 2 (two) times daily. (Patient not taking: Reported on 11/10/2023) 14 tablet 0   No facility-administered medications prior to visit.   Review of Systems  Review of Systems  Constitutional:  Positive for diaphoresis. Negative for fever.  HENT:  Negative.    Respiratory:  Positive for cough. Negative for chest tightness, shortness of breath and wheezing.   Cardiovascular: Negative.      Physical Exam  BP 124/66 (BP Location: Right Arm, Patient Position: Sitting, Cuff Size: Normal)   Pulse (!) 102   Temp (!) 97.3 F (36.3 C) (Temporal)   Ht 5\' 3"  (1.6 m)   Wt 160 lb 3.2 oz (72.7 kg)   SpO2 96%   BMI 28.38 kg/m  Physical Exam Constitutional:      General: She is not in acute distress.    Appearance: Normal appearance. She is not ill-appearing.  HENT:     Head: Normocephalic and atraumatic.  Cardiovascular:     Rate and Rhythm: Normal rate and regular rhythm.  Pulmonary:     Effort: Pulmonary effort is normal. No respiratory distress.     Breath sounds: Normal breath sounds. No wheezing, rhonchi or rales.     Comments: CTA Musculoskeletal:  General: Normal range of motion.  Skin:    General: Skin is warm and dry.  Neurological:     General: No focal deficit present.     Mental Status: She is alert and oriented to person, place, and time. Mental status is at baseline.  Psychiatric:        Mood and Affect: Mood normal.        Behavior: Behavior normal.        Thought Content: Thought content normal.        Judgment: Judgment normal.      Lab Results:  CBC    Component Value Date/Time   WBC 6.6 10/23/2023 1829   RBC 3.85 (L) 10/23/2023 1829   HGB 12.2 10/23/2023 1851   HGB 12.9 10/20/2012 1358   HCT 36.0 10/23/2023 1851   HCT 40.2 10/20/2012 1358   PLT 169 10/23/2023 1829   PLT 265 10/20/2012 1358   MCV 96.6 10/23/2023 1829   MCV 82.1 10/20/2012 1358   MCH 31.9 10/23/2023 1829   MCHC 33.1 10/23/2023 1829   RDW 13.2 10/23/2023 1829   RDW 16.9 (H) 10/20/2012 1358   LYMPHSABS 1.2 10/23/2023 1829   LYMPHSABS 1.3 10/20/2012 1358   MONOABS 0.6 10/23/2023 1829   MONOABS 0.6 10/20/2012 1358   EOSABS 0.2 10/23/2023 1829   EOSABS 0.1 10/20/2012 1358   BASOSABS 0.0 10/23/2023 1829   BASOSABS 0.1  10/20/2012 1358    BMET    Component Value Date/Time   NA 142 10/23/2023 1851   NA 142 03/28/2017 0000   NA 141 10/20/2012 1358   K 3.6 10/23/2023 1851   K 3.9 10/20/2012 1358   CL 103 10/23/2023 1851   CL 104 10/20/2012 1358   CO2 27 10/23/2023 1829   CO2 27 10/20/2012 1358   GLUCOSE 81 10/23/2023 1851   GLUCOSE 109 (H) 10/20/2012 1358   BUN 14 10/23/2023 1851   BUN 14 03/28/2017 0000   BUN 15.6 10/20/2012 1358   CREATININE 0.70 10/23/2023 1851   CREATININE 1.03 (H) 05/18/2023 1528   CREATININE 1.1 10/20/2012 1358   CALCIUM  8.7 (L) 10/23/2023 1829   CALCIUM  7.8 (L) 03/26/2017 0354   CALCIUM  9.7 10/20/2012 1358   GFRNONAA >60 10/23/2023 1829   GFRNONAA 80 07/04/2020 1457   GFRAA 93 07/04/2020 1457    BNP    Component Value Date/Time   BNP 35.6 10/23/2023 1829    ProBNP No results found for: "PROBNP"  Imaging: CT Angio Chest PE W/Cm &/Or Wo Cm Result Date: 10/23/2023 CLINICAL DATA:  Chest pain, pulmonary embolism suspected, recent left shoulder surgery EXAM: CT ANGIOGRAPHY CHEST WITH CONTRAST TECHNIQUE: Multidetector CT imaging of the chest was performed using the standard protocol during bolus administration of intravenous contrast. Multiplanar CT image reconstructions and MIPs were obtained to evaluate the vascular anatomy. RADIATION DOSE REDUCTION: This exam was performed according to the departmental dose-optimization program which includes automated exposure control, adjustment of the mA and/or kV according to patient size and/or use of iterative reconstruction technique. CONTRAST:  75mL OMNIPAQUE  IOHEXOL  350 MG/ML SOLN COMPARISON:  01/10/2022, 10/23/2023 FINDINGS: Cardiovascular: This is a technically adequate evaluation of the pulmonary vasculature. No filling defects or pulmonary emboli. The heart is unremarkable without pericardial effusion. No evidence of thoracic aortic aneurysm or dissection. Mediastinum/Nodes: No enlarged mediastinal, hilar, or axillary lymph  nodes. Thyroid  gland, trachea, and esophagus demonstrate no significant findings. Lungs/Pleura: There is dense consolidation within the lingula and left lower lobe, with associated volume loss, favor atelectasis.  Dependent hypoventilatory changes are seen within the right upper lobe. Trace left pleural effusion. No pneumothorax. Central airways are patent. Upper Abdomen: No acute abnormality. Musculoskeletal: Subcutaneous edema and soft tissue gas are seen surrounding the left shoulder, consistent with recent left shoulder arthroplasty. Unremarkable right shoulder arthroplasty. There are no acute displaced fractures. Reconstructed images demonstrate no additional findings. Review of the MIP images confirms the above findings. IMPRESSION: 1. No evidence of pulmonary embolus. 2. Left basilar atelectasis and trace left pleural effusion, corresponding to chest x-ray findings. 3. Postsurgical changes from recent left shoulder arthroplasty. Electronically Signed   By: Bobbye Burrow M.D.   On: 10/23/2023 22:31   DG Chest Port 1 View Result Date: 10/23/2023 CLINICAL DATA:  Chest pain EXAM: PORTABLE CHEST 1 VIEW COMPARISON:  X-ray 05/05/2023. FINDINGS: Underinflation. Left lung base parenchymal opacity identified. Question tiny left effusion. Acute infiltrate is possible. No pneumothorax or edema. No consolidation in the right lung the right-sided pleural effusion. Normal cardiopericardial silhouette. Degenerative changes and curvature of the spine. Overlapping cardiac leads. There is a battery pack overlying the right hemithorax with leads extending towards the right side of the neck. Extensive heart fixation along the cervical spine as well as bilateral shoulder reverse arthroplasties. The left-sided focus is new. There is also been resection of the distal portion of each clavicle. IMPRESSION: Developing opacity left retrocardiac with a question tiny left effusion. Acute infiltrate is possible. Recommend follow-up.  Electronically Signed   By: Adrianna Horde M.D.   On: 10/23/2023 18:26   DG Shoulder Left Port Result Date: 10/21/2023 CLINICAL DATA:  Postop. EXAM: LEFT SHOULDER COMPARISON:  Preoperative CT FINDINGS: Reverse left shoulder arthroplasty in expected alignment. No periprosthetic lucency or fracture. Recent postsurgical change includes air and edema in the joint space and soft tissues. Chronic widening of the acromioclavicular joint. IMPRESSION: Reverse left shoulder arthroplasty without immediate postoperative complication. Electronically Signed   By: Chadwick Colonel M.D.   On: 10/21/2023 10:42     Assessment & Plan:   1. Community acquired pneumonia of left lower lobe of lung (Primary) - DG Chest 2 View; Future - CBC with Differential; Future - CBC with Differential  Assessment and Plan    Pneumonia Patient developed pneumonia following shoulder surgery on April 23rd. CTA on 10/23/23 showed dense consolidation left lower lobe. Symptoms include persistent cough and occasional mucus production. Completed a 7-day course of doxycycline  with improvement in symptoms. No fever, but reports sweating episodes. Lungs are clear on auscultation. Chest x-ray planned for follow-up to ensure resolution. Checking CBC today. - Order chest x-ray for the first or second week of June to ensure resolution of pneumonia. - Advise use of incentive spirometer for 5-7 more days to encourage deep breathing. - Recommend Mucinex  for 5-7 more days to aid mucus clearance. - Return to clinic if respiratory symptoms worsen    Antonio Baumgarten, NP 11/10/2023

## 2023-11-10 NOTE — Patient Instructions (Addendum)
  VISIT SUMMARY: You had a follow-up appointment today to check on your recovery from pneumonia after your shoulder surgery. You completed a 7-day course of doxycycline  and are feeling better, although you still have a cough and occasional mucus production. You also mentioned experiencing sweating without a fever. Your lungs sounded clear during the examination.  YOUR PLAN: -PNEUMONIA: Pneumonia is an infection that inflames the air sacs in one or both lungs. You had mild pneumonia in your left lung, likely due to your recent shoulder surgery. You completed a 7-day course of doxycycline , which has improved your symptoms. To ensure the pneumonia has resolved, a chest x-ray is planned for the first or second week of June. You should also use your incentive spirometer for 5-7 more days to encourage deep breathing and take Mucinex  for 5-7 more days to help clear mucus.  INSTRUCTIONS: Please schedule a chest x-ray for the first or second week of June to ensure your pneumonia has resolved. Continue using your incentive spirometer for 5-7 more days and take Mucinex  for 5-7 more days to help with mucus clearance.

## 2023-11-11 ENCOUNTER — Ambulatory Visit: Payer: Self-pay | Admitting: Primary Care

## 2023-11-11 LAB — CBC WITH DIFFERENTIAL/PLATELET
Basophils Absolute: 0.1 10*3/uL (ref 0.0–0.1)
Basophils Relative: 0.8 % (ref 0.0–3.0)
Eosinophils Absolute: 0.3 10*3/uL (ref 0.0–0.7)
Eosinophils Relative: 3.3 % (ref 0.0–5.0)
HCT: 44 % (ref 36.0–46.0)
Hemoglobin: 14.7 g/dL (ref 12.0–15.0)
Lymphocytes Relative: 19.9 % (ref 12.0–46.0)
Lymphs Abs: 1.9 10*3/uL (ref 0.7–4.0)
MCHC: 33.4 g/dL (ref 30.0–36.0)
MCV: 96.1 fl (ref 78.0–100.0)
Monocytes Absolute: 0.7 10*3/uL (ref 0.1–1.0)
Monocytes Relative: 7.6 % (ref 3.0–12.0)
Neutro Abs: 6.5 10*3/uL (ref 1.4–7.7)
Neutrophils Relative %: 68.4 % (ref 43.0–77.0)
Platelets: 365 10*3/uL (ref 150.0–400.0)
RBC: 4.58 Mil/uL (ref 3.87–5.11)
RDW: 13.5 % (ref 11.5–15.5)
WBC: 9.4 10*3/uL (ref 4.0–10.5)

## 2023-11-12 DIAGNOSIS — M25512 Pain in left shoulder: Secondary | ICD-10-CM | POA: Diagnosis not present

## 2023-11-17 ENCOUNTER — Other Ambulatory Visit (HOSPITAL_BASED_OUTPATIENT_CLINIC_OR_DEPARTMENT_OTHER): Payer: Self-pay

## 2023-11-18 ENCOUNTER — Other Ambulatory Visit: Payer: Self-pay

## 2023-11-18 ENCOUNTER — Other Ambulatory Visit (HOSPITAL_BASED_OUTPATIENT_CLINIC_OR_DEPARTMENT_OTHER): Payer: Self-pay

## 2023-11-19 ENCOUNTER — Other Ambulatory Visit (HOSPITAL_COMMUNITY): Payer: Self-pay

## 2023-11-19 DIAGNOSIS — M25512 Pain in left shoulder: Secondary | ICD-10-CM | POA: Diagnosis not present

## 2023-11-20 ENCOUNTER — Other Ambulatory Visit (HOSPITAL_COMMUNITY): Payer: Self-pay

## 2023-11-20 ENCOUNTER — Other Ambulatory Visit: Payer: Self-pay

## 2023-11-24 ENCOUNTER — Ambulatory Visit: Payer: 59 | Admitting: Internal Medicine

## 2023-11-24 DIAGNOSIS — M19012 Primary osteoarthritis, left shoulder: Secondary | ICD-10-CM | POA: Diagnosis not present

## 2023-11-26 ENCOUNTER — Ambulatory Visit: Payer: Self-pay | Admitting: Family Medicine

## 2023-11-26 ENCOUNTER — Ambulatory Visit (INDEPENDENT_AMBULATORY_CARE_PROVIDER_SITE_OTHER): Payer: PRIVATE HEALTH INSURANCE | Admitting: Family Medicine

## 2023-11-26 ENCOUNTER — Telehealth: Payer: Self-pay

## 2023-11-26 ENCOUNTER — Encounter: Payer: Self-pay | Admitting: Family Medicine

## 2023-11-26 VITALS — BP 122/80 | HR 80 | Temp 97.8°F | Ht 63.0 in | Wt 155.5 lb

## 2023-11-26 DIAGNOSIS — E1122 Type 2 diabetes mellitus with diabetic chronic kidney disease: Secondary | ICD-10-CM

## 2023-11-26 DIAGNOSIS — N182 Chronic kidney disease, stage 2 (mild): Secondary | ICD-10-CM | POA: Diagnosis not present

## 2023-11-26 DIAGNOSIS — I700 Atherosclerosis of aorta: Secondary | ICD-10-CM | POA: Diagnosis not present

## 2023-11-26 DIAGNOSIS — R5383 Other fatigue: Secondary | ICD-10-CM

## 2023-11-26 DIAGNOSIS — E785 Hyperlipidemia, unspecified: Secondary | ICD-10-CM

## 2023-11-26 DIAGNOSIS — E039 Hypothyroidism, unspecified: Secondary | ICD-10-CM | POA: Diagnosis not present

## 2023-11-26 DIAGNOSIS — E559 Vitamin D deficiency, unspecified: Secondary | ICD-10-CM | POA: Diagnosis not present

## 2023-11-26 DIAGNOSIS — J189 Pneumonia, unspecified organism: Secondary | ICD-10-CM | POA: Diagnosis not present

## 2023-11-26 DIAGNOSIS — I10 Essential (primary) hypertension: Secondary | ICD-10-CM | POA: Diagnosis not present

## 2023-11-26 DIAGNOSIS — E1169 Type 2 diabetes mellitus with other specified complication: Secondary | ICD-10-CM

## 2023-11-26 DIAGNOSIS — Z79899 Other long term (current) drug therapy: Secondary | ICD-10-CM

## 2023-11-26 DIAGNOSIS — E119 Type 2 diabetes mellitus without complications: Secondary | ICD-10-CM

## 2023-11-26 DIAGNOSIS — D513 Other dietary vitamin B12 deficiency anemia: Secondary | ICD-10-CM | POA: Diagnosis not present

## 2023-11-26 DIAGNOSIS — M797 Fibromyalgia: Secondary | ICD-10-CM

## 2023-11-26 LAB — HEMOGLOBIN A1C: Hgb A1c MFr Bld: 6.2 % (ref 4.6–6.5)

## 2023-11-26 LAB — LIPID PANEL
Cholesterol: 238 mg/dL — ABNORMAL HIGH (ref 0–200)
HDL: 38.7 mg/dL — ABNORMAL LOW (ref >39.00)
LDL Cholesterol: 144 mg/dL — ABNORMAL HIGH (ref 0–99)
NonHDL: 199.73
Total CHOL/HDL Ratio: 6
Triglycerides: 280 mg/dL — ABNORMAL HIGH (ref 0.0–149.0)
VLDL: 56 mg/dL — ABNORMAL HIGH (ref 0.0–40.0)

## 2023-11-26 LAB — FERRITIN: Ferritin: 101.2 ng/mL (ref 10.0–291.0)

## 2023-11-26 LAB — COMPREHENSIVE METABOLIC PANEL WITH GFR
ALT: 10 U/L (ref 0–35)
AST: 19 U/L (ref 0–37)
Albumin: 4.4 g/dL (ref 3.5–5.2)
Alkaline Phosphatase: 48 U/L (ref 39–117)
BUN: 20 mg/dL (ref 6–23)
CO2: 28 meq/L (ref 19–32)
Calcium: 9.6 mg/dL (ref 8.4–10.5)
Chloride: 102 meq/L (ref 96–112)
Creatinine, Ser: 0.84 mg/dL (ref 0.40–1.20)
GFR: 68.1 mL/min (ref >60.00)
Glucose, Bld: 110 mg/dL — ABNORMAL HIGH (ref 70–99)
Potassium: 4.1 meq/L (ref 3.5–5.1)
Sodium: 140 meq/L (ref 135–145)
Total Bilirubin: 0.5 mg/dL (ref 0.2–1.2)
Total Protein: 7.2 g/dL (ref 6.0–8.3)

## 2023-11-26 LAB — VITAMIN B12: Vitamin B-12: 1040 pg/mL — ABNORMAL HIGH (ref 211–911)

## 2023-11-26 LAB — VITAMIN D 25 HYDROXY (VIT D DEFICIENCY, FRACTURES): VITD: 33.75 ng/mL (ref 30.00–100.00)

## 2023-11-26 LAB — MAGNESIUM: Magnesium: 2.1 mg/dL (ref 1.5–2.5)

## 2023-11-26 LAB — TSH: TSH: 2.83 u[IU]/mL (ref 0.35–5.50)

## 2023-11-26 NOTE — Assessment & Plan Note (Signed)
 Continue fenofibrate  (statin myopathy)

## 2023-11-26 NOTE — Patient Instructions (Signed)
 Please go downstairs for labs before you leave.   I recommend using your albuterol  inhaler for cough and shortness of breath.   Call and schedule your diabetic eye exam.   Follow up with your lung specialist as recommended.   We will be in touch with your lab results and with recommendations.  Our office will call to schedule you to see our pharmacist, Cristy. Let them know you would like to do an office visit instead of a telephone visit

## 2023-11-26 NOTE — Assessment & Plan Note (Signed)
 Continue current levothyroxine  dose. check TSH

## 2023-11-26 NOTE — Assessment & Plan Note (Signed)
 At goal.   Continue current medication and low-sodium diet.  Recommend monitoring blood pressure at home.

## 2023-11-26 NOTE — Assessment & Plan Note (Signed)
 Reviewed pulmonology note. Improving.  No acute distress.  She will follow-up with pulmonology as recommended. Recommend using albuterol .

## 2023-11-26 NOTE — Assessment & Plan Note (Signed)
 Check A1c and follow up. Eye exams yearly.

## 2023-11-26 NOTE — Assessment & Plan Note (Signed)
 Check B12 level.

## 2023-11-26 NOTE — Assessment & Plan Note (Signed)
 Need good blood sugar control to prevent worsening renal function. Check renal function

## 2023-11-26 NOTE — Telephone Encounter (Signed)
 Referral it place for pt to see you.

## 2023-11-26 NOTE — Progress Notes (Signed)
 Subjective:     Patient ID: Crystal [name], female    DOB: 02/05/1951, 73 y.o.   MRN: [id number]  Chief Complaint  Patient presents with   Medical Management of Chronic Issues    Follow up, issue with dizziness ( was diagnoses with vertigo few years ago), fatigue     HPI History of Present Illness         She is here for 4-week follow-up.  Established care with me on 10/29/2023.  Previous PCP Dr. Cassondra Cliff   Last CPE 05/2023   Other providers: Dr. Baldwin Levee - Pulmonology  Dr. General Kenner- GI  Dermatologist  Urologist in the past for urinary incontinence  Rheumatologist-  Eye- [facility] ophthalmology    PMH hx includes DM II- diet controlled, CAD, hypertension, CKD, fibromyalgia, GERD, restless leg syndrome, OSA not on CPAP. She is followed by Dr. Linder Revere for obstructive sleep apnea, rhinitis, bronchiectasis. She has an inspire device.   Diagnosed with CAP 10/28/2023  She was seen by pulmonology 11/10/2023. She completed doxycycline .   States she is still coughing in the morning and evening.  States she has not used her inhaler   HLD- fenofibrate  (statin myopathy) and Crohn's disease -taking [name]    Vitamin D  5,000 IUs daily   Fibromyalgia -takes duloxetine  daily and gabapentin  3 times daily.  Neuropathy bilaterally  RLS -takes Requip   Reports having overactive bladder but originally she was taking oxybutynin  for hyperhidrosis which is effective.   Last eye exam- 1 year ago    Health Maintenance Due  Topic Date Due   OPHTHALMOLOGY EXAM  01/23/2023   Medicare Annual Wellness (AWV)  08/20/2023    Past Medical History:  Diagnosis Date   Allergy  1966   Anemia    Anxiety 2024   Nerve pain   Arthritis 2003   Rhuematologist   ASCVD (arteriosclerotic cardiovascular disease)    Blood transfusion without reported diagnosis    Hospital-surgery   Cataract 2000's   Surgery   Chronic kidney disease 2020's   [facility] hospital   Closed nondisplaced  subtrochanteric fracture of left femur (HCC) 09/03/2017   September 2018.  Status post surgery by Dr. 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   TIA   Sleep apnea    does not use CPAP   Spinal headache    with the C section   Stroke (HCC)    Hx TIAs x 3   last was around 2014   TIA (transient ischemic attack)    3         Ulcer    One   Vitamin D  deficiency     Past Surgical History:  Procedure Laterality Date   ABDOMINAL HYSTERECTOMY  1992   ANTERIOR CERVICAL DECOMPRESSION/DISCECTOMY FUSION 4 LEVELS N/A 06/14/2015  Procedure: Cervical three-four Cervical four-five Cervical five- six Cervical six- seven  Anterior cervical decompression/diskectomy/fusion;  Surgeon: Manya Sells, MD;  Location: [facility];  Service: Neurosurgery;  Laterality: N/A;  C3-4 C4-5 C5-6 C6-7 Anterior cervical decompression/diskectomy/fusion   APPENDECTOMY  1960   BACK SURGERY     surgeries x 3   CATARACT EXTRACTION, BILATERAL     CESAREAN SECTION  1983 & 1886   DRUG INDUCED ENDOSCOPY N/A 02/25/2022   Procedure: DRUG INDUCED SLEEP ENDOSCOPY;  Surgeon: Ammon Bales, MD;  Location: [facility];  Service: ENT;  Laterality: N/A;   EYE SURGERY     bilateral cataracts   FINGER ARTHROSCOPY     FRACTURE SURGERY  2020   Femur   IMPLANTATION OF HYPOGLOSSAL NERVE STIMULATOR Right 05/29/2022   Procedure:  IMPLANTATION OF HYPOGLOSSAL NERVE STIMULATOR;  Surgeon: Ammon Bales, MD;  Location[facility];  Service: ENT;  Laterality: Right;   JOINT REPLACEMENT     2   LEFT  1 RIGHT    KNEE SURGERY     ORIF FEMUR FRACTURE Left 03/24/2017   Procedure: OPEN REDUCTION INTERNAL FIXATION (ORIF) FEMUR FRACTURE;  Surgeon: Hardy Lia, MD;  Location: [facility] OR;  Service: Orthopedics;  Laterality: Left;   REVERSE SHOULDER ARTHROPLASTY Left 10/21/2023   Procedure: ARTHROPLASTY, SHOULDER, TOTAL, REVERSE;  Surgeon: Micheline Ahr, MD;  Location: [facility];  Service: Orthopedics;  Laterality: Left;   ROTATOR CUFF REPAIR Left 2005   2 LEFT  1  RIGHT   SHOULDER ADHESION RELEASE     SPINAL CORD DECOMPRESSION  1998   SPINE SURGERY     TOE SURGERY     LEFT        TOE SURGERY     RIGHT  BIG TOE   TONSILLECTOMY  1960   TRANSFORAMINAL LUMBAR INTERBODY FUSION W/ MIS 1 LEVEL Left 09/05/2021   Procedure: MINIMALLY INVASIVE TRANSFORAMINAL LUMBAR INTERBODY FUSION LUMBAR THREE-FOUR; EXPLORE FUSION WITH REVISION OF HARDWARE LUMBAR FOUR-FIVE,LEFT SIDE APPROACH;  Surgeon: Pincus Bridgeman, DO;  Location: [facility] OR;  Service: Neurosurgery;  Laterality: Left;    Family History  Problem Relation Age of Onset   Cancer Mother    Diabetes Mother    Osteoporosis Mother    Breast cancer Mother    Arthritis Mother    Miscarriages / [place] Mother    Obesity Mother    Heart disease Father    Hypertension Father    Hyperlipidemia Father    Neuropathy Father    Stroke Father    Leukemia Sister    Lupus Sister    Early death Sister    Miscarriages / [place] Sister    Depression Sister    Rheum arthritis Sister    Cancer Sister    Miscarriages / [place] Sister    Migraines Daughter    Cancer Daughter    Cancer Son    Early death Son    Liver disease Neg Hx    Colon cancer Neg Hx    Esophageal cancer Neg Hx     Social History   Socioeconomic History   Marital status: Married    Spouse name: Not on file   Number  of children: 2   Years of education: Not on file   Highest education level: Bachelor's degree (e.g., BA, AB, BS)  Occupational History   Occupation: Retired Advertising account executive: DISABLED  Tobacco Use   Smoking status: Never    Passive exposure: Past   Smokeless  tobacco: Never  Vaping Use   Vaping status: Never Used  Substance and Sexual Activity   Alcohol use: Yes    Alcohol/week: 4.0 standard drinks of alcohol    Types: 2 Cans of beer, 2 Standard drinks or equivalent per week    Comment: On and off   Drug use: No   Sexual activity: Not Currently    Birth control/protection: None    Comment: Hysterectomy  Other Topics Concern   Not on file  Social History Narrative   Drinks about 1 cup of coffee a day, occasional coke zero    Social Drivers of Corporate investment banker Strain: Low Risk  (08/23/2023)   Overall Financial Resource Strain (CARDIA)    Difficulty of Paying Living Expenses: Not hard at all  Food Insecurity: No Food Insecurity (08/23/2023)   Hunger Vital Sign    Worried About Running Out of Food in the Last Year: Never true    Ran Out of Food in the Last Year: Never true  Transportation Needs: No Transportation Needs (08/23/2023)   PRAPARE - Transportation    Lack of Transportation (Medical): No    Lack of Transportation (Non-Medical): No  Physical Activity: Unknown (08/23/2023)   Exercise Vital Sign    Days of Exercise per Week: Patient declined    Minutes of Exercise per Session: Not on file  Stress: Stress Concern Present (08/23/2023)   [employer] of Occupational Health - Occupational Stress Questionnaire    Feeling of Stress : To some extent  Social Connections: Unknown (08/23/2023)   Social Connection and Isolation Panel [NHANES]    Frequency of Communication with Friends and Family: More than three times a week    Frequency of Social Gatherings with Friends and Family: Once a week    Attends Religious Services: Patient declined    Automotive engineer or Organizations: Yes    Attends Engineer, structural: More than 4 times per year    Marital Status: Married  Catering manager Violence: Not on file    Outpatient Medications Prior to Visit  Medication Sig Dispense Refill   acyclovir  (ZOVIRAX ) 400 MG tablet TAKE 1 TABLET BY MOUTH EVERY DAY 90 tablet 1   aspirin  (ASPIRIN  CHILDRENS) 81 MG chewable tablet Chew 1 tablet (81 mg total) by mouth 2 (two) times daily. For 6 weeks for DVT prophylaxis after surgery 84 tablet 0   bisoprolol -hydrochlorothiazide  (ZIAC ) 10-6.25 MG tablet Take 1 tablet by mouth daily. 90 tablet 2   Black Pepper-Turmeric (TURMERIC PLUS BLACK PEPPER EXT PO) Take 1 tablet by mouth daily.     buPROPion  (WELLBUTRIN  XL) 300 MG 24 hr tablet Take 1 tablet (300 mg total) by mouth daily for mood, focus & concentration. 90 tablet 3   carboxymethylcellulose (REFRESH PLUS) 0.5 % SOLN Place 1 drop into both eyes 3 (three) times daily as needed (dry eyes).     clobetasol ointment (TEMOVATE) 0.05 % Apply 1 Application topically 3 (three) times daily as needed (lichen planus).     clotrimazole  (MYCELEX ) 10 MG troche Dissolve 1 tablet (10 mg total) in mouth 3 (three) times daily. 140 Troche 11   dexamethasone  (DECADRON ) 0.5 MG/5ML solution Take 5 mLs (0.5 mg total) by mouth 3 (three) times daily. (Patient taking differently: Take 0.5 mg by mouth 2 (two) times daily.) 100 mL 1   diclofenac  Sodium (VOLTAREN ) 1 % GEL Apply 2 grams topically daily as needed (pain). 100 g 1   DULoxetine  (CYMBALTA ) 60 MG capsule  Take 1 capsule (60 mg total) by mouth daily for chronic pain. 90 capsule 3   fenofibrate  micronized (LOFIBRA) 134 MG capsule Take 1 capsule (134 mg total) by mouth daily before breakfast 90 capsule 3   Ferrous Sulfate (IRON PO) Take 25 mg by mouth daily.     fluticasone  (FLONASE ) 50 MCG/ACT nasal spray Use  1-2 Sprays  each nostril   1-2 x /day  as needed 16 g 5   gabapentin  (NEURONTIN ) 300 MG capsule Take  1 capsule   3 x /day  or Chronic Pain    (Dx: m79.7)                                               /                                TAKE                                   BY                                  MOUTH     Glycerin, Laxative, (FLEET LIQUID GLYCERIN SUPP RE) Place 1 Dose rectally daily as needed (Constipation).     levothyroxine  (SYNTHROID ) 88 MCG tablet Take 1 tablet (88 mcg total) by mouth daily on an empty stomach with water  for 30 minutes.  No antacids, calcium , magnesium  for 4 hours. Avoid biotin. 90 tablet 3   meclizine  (ANTIVERT ) 25 MG tablet TAKE 1 TABLET (25 MG TOTAL) BY MOUTH 3 (THREE) TIMES DAILY AS NEEDED FOR DIZZINESS. 90 tablet 0   mupirocin ointment (BACTROBAN) 2 % Apply 1 Application topically 3 (three) times daily as needed (lichen planus).     neomycin -polymyxin-hydrocortisone (CORTISPORIN) OTIC solution Place 3 drops into both ears daily as needed (ear inflammation).     omeprazole  (PRILOSEC) 40 MG capsule Take 1 capsule (40 mg total) by mouth daily. 90 capsule 3   OVER THE COUNTER MEDICATION Place 1 drop under the tongue daily. D3 5000 units - K2 120 mcg     oxybutynin  (DITROPAN ) 5 MG tablet Take 1 tablet 2 x /day fr Excessive Sweating 180 tablet 1   polyethylene glycol (MIRALAX  / GLYCOLAX ) 17 g packet Take 17-51 g by mouth 3 (three) times daily. Take 3 doses daily     promethazine  (PHENERGAN ) 25 MG tablet Take 1 tablet (25 mg total) by mouth every 6 (six) hours as needed for nausea or vomiting. 10 tablet 0   Rectal Protectant-Emollient (CALMOL-4) 76-10 % SUPP Use as directed once to twice daily (Patient taking differently: Place 1 Dose rectally daily as needed (Hemorrhoids).)     Risankizumab -rzaa ([name] ) 360 MG/2.4ML SOCT Inject 1 Cartridge into the skin every 8 (eight) weeks. 2.4 mL 5   rOPINIRole  (REQUIP ) 2 MG tablet Take 1 tablet (2 mg total) by mouth 2 (two) times daily AND 2 tablets (4 mg total) at bedtime for restless legs 360 tablet 3   tizanidine  (ZANAFLEX ) 2 MG capsule  Take 1 capsule (2 mg total) by mouth at bedtime for muscle spasm. 90 capsule 3   traMADol  (ULTRAM ) 50 MG tablet  Take 1-2 tablets (50-100 mg total) by mouth every 6 (six) hours as needed (for SEVERE pain ONLY.). NO REFILLS 30 tablet 0   Vitamin D -Vitamin K (K2-D3 5000 PO) Take 5,000 Units by mouth daily.     No facility-administered medications prior to visit.    Allergies  Allergen Reactions   Methocarbamol  Shortness Of Breath, Nausea Only and Other (See Comments)    Other reaction(s): Dizziness, psychological reaction, disoriented Sleepy and weight loss    Codeine Other (See Comments)    Headache   Statins Other (See Comments)    Extreme pain   Azathioprine    Other Other (See Comments)    MINT=sore mouth and stomach upset   Oxycodone      Dizziness, unbalanced.   Amitriptyline Other (See Comments)    Confusion and hallucinations   Erythromycin Base Rash    Flushing/red face.   Fish Oil Nausea Only   Flax Seed [Flax Seed Oil] Rash   Flaxseed (Linseed) Rash   Hydrocodone  Itching and Rash        Morphine Nausea And Vomiting and Rash   Nsaids Nausea Only   Nuvigil [Armodafinil] Other (See Comments)    Unspecified   Sertraline Rash   Sinemet [Carbidopa-Levodopa] Rash   Sulfa Antibiotics Rash   Tolmetin Nausea Only    Review of Systems  Constitutional:  Positive for malaise/fatigue. Negative for chills and fever.  Respiratory:  Positive for cough and shortness of breath. Negative for sputum production and wheezing.   Cardiovascular:  Negative for chest pain, palpitations and leg swelling.  Gastrointestinal:  Negative for abdominal pain, constipation, diarrhea, nausea and vomiting.  Genitourinary:  Negative for dysuria, frequency and urgency.  Musculoskeletal:  Negative for falls.  Neurological:  Positive for dizziness. Negative for focal weakness and headaches.       Objective:     Physical Exam Constitutional:      General: She is not in acute distress.     Appearance: She is not ill-appearing.  HENT:     Mouth/Throat:     Mouth: Mucous membranes are moist.  Eyes:     Extraocular Movements: Extraocular movements intact.     Conjunctiva/sclera: Conjunctivae normal.  Cardiovascular:     Rate and Rhythm: Normal rate and regular rhythm.  Pulmonary:     Effort: Pulmonary effort is normal.     Breath sounds: Normal breath sounds.  Musculoskeletal:     Cervical back: Normal range of motion and neck supple.     Right lower leg: No edema.     Left lower leg: No edema.  Skin:    General: Skin is warm and dry.  Neurological:     General: No focal deficit present.     Mental Status: She is alert and oriented to person, place, and time.     Motor: No weakness.     Coordination: Coordination normal.     Gait: Gait normal.  Psychiatric:        Mood and Affect: Mood normal.        Behavior: Behavior normal.        Thought Content: Thought content normal.      BP 122/80   Pulse 80   Temp 97.8 F (36.6 C) (Temporal)   Ht 5\' 3"  (1.6 m)   Wt 155 lb 8 oz (70.5 kg)   SpO2 95%   BMI 27.55 kg/m  Wt Readings from Last 3 Encounters:  11/26/23 155 lb 8 oz (70.5 kg)  11/10/23 160  lb 3.2 oz (72.7 kg)  10/29/23 156 lb (70.8 kg)       Assessment & Plan:   Problem List Items Addressed This Visit     Aortic atherosclerosis (HCC) by Chest CT scan on 09/2017   Relevant Orders   Lipid panel (Completed)   B12 deficiency anemia   Check B12 level      Relevant Orders   Vitamin B12 (Completed)   CAP (community acquired pneumonia)   Reviewed pulmonology note. Improving.  No acute distress.  She will follow-up with pulmonology as recommended. Recommend using albuterol .       CKD stage 2 due to type 2 diabetes mellitus (HCC)   Need good blood sugar control to prevent worsening renal function. Check renal function       Relevant Orders   Comprehensive metabolic panel with GFR (Completed)   Diet-controlled type 2 diabetes mellitus (HCC) -  Primary   Check A1c and follow up. Eye exams yearly.       Relevant Orders   Comprehensive metabolic panel with GFR (Completed)   Hemoglobin A1c (Completed)   TSH (Completed)   Essential hypertension   At goal.   Continue current medication and low-sodium diet.  Recommend monitoring blood pressure at home.        Relevant Orders   Comprehensive metabolic panel with GFR (Completed)   Fibromyalgia   Hyperlipidemia associated with type 2 diabetes mellitus (HCC)   Continue fenofibrate  (statin myopathy)      Relevant Orders   Lipid panel (Completed)   Hypothyroid   Continue current levothyroxine  dose. check TSH       Relevant Orders   TSH (Completed)   Medication management   Relevant Orders   pharmacy consult   Vitamin D  deficiency   Relevant Orders   VITAMIN D  25 Hydroxy (Vit-D Deficiency, Fractures) (Completed)   Other Visit Diagnoses       Fatigue, unspecified type       Relevant Orders   Ferritin (Completed)   Magnesium  (Completed)   Vitamin B12 (Completed)   TSH (Completed)       I am having Flora Humphreys. [place]ham "Pat" maintain her meclizine , carboxymethylcellulose, Ferrous Sulfate (IRON PO), polyethylene glycol, clobetasol ointment, mupirocin ointment, OVER THE COUNTER MEDICATION, neomycin -polymyxin-hydrocortisone, Black Pepper-Turmeric (TURMERIC PLUS BLACK PEPPER EXT PO), diclofenac  Sodium, gabapentin , fluticasone , Vitamin D -Vitamin K (K2-D3 5000 PO), Calmol-4, buPROPion , omeprazole , DULoxetine , clotrimazole , levothyroxine , rOPINIRole , dexamethasone , [name] , (Glycerin, Laxative, (FLEET LIQUID GLYCERIN SUPP RE)), aspirin , promethazine , oxybutynin , fenofibrate  micronized, bisoprolol -hydrochlorothiazide , tizanidine , traMADol , and acyclovir .  No orders of the defined types were placed in this encounter.

## 2023-12-01 DIAGNOSIS — M25512 Pain in left shoulder: Secondary | ICD-10-CM | POA: Diagnosis not present

## 2023-12-02 ENCOUNTER — Other Ambulatory Visit (HOSPITAL_COMMUNITY): Payer: Self-pay

## 2023-12-02 ENCOUNTER — Telehealth: Payer: Self-pay | Admitting: Gastroenterology

## 2023-12-02 NOTE — Telephone Encounter (Signed)
 [name]/Dr General Kenner- Have either of you seen any forms sent by patient for [employer] [name]  renewal?

## 2023-12-02 NOTE — Telephone Encounter (Signed)
 I believe I signed something for her previously, but have not seen anything on my desk regarding this recently.

## 2023-12-02 NOTE — Telephone Encounter (Signed)
 Patient called and stated that she sent us  a form to renew her skyrizi  and when she spoke to [place] they informed her that they do not have that form faxed over from us . Patient is requesting a call back. .Please advise.

## 2023-12-02 NOTE — Telephone Encounter (Signed)
 Left message on voicemail asking patient if she brought form to our office as we have not seen form. If needed, we can print [employer] assistance forms from the internet, fill out our portion and fax to the company. Patient to call back or send mychart response.

## 2023-12-03 NOTE — Telephone Encounter (Signed)
 Patient called back and says she brought forms in a white envelope, gave to front desk staff and a cilnical staff member came to the front to pick up paperwork. Since I am unable to locate this envelope, I have printed a skyrizi  patient assistance form, filled out physician portion and placed on Dr Tena Feeling desk for review/signature. Once this is complete, I will fax to [employer].

## 2023-12-09 ENCOUNTER — Encounter: Payer: Self-pay | Admitting: Gastroenterology

## 2023-12-09 ENCOUNTER — Telehealth: Payer: Self-pay

## 2023-12-09 ENCOUNTER — Ambulatory Visit: Admitting: Gastroenterology

## 2023-12-09 VITALS — BP 114/68 | HR 60 | Ht 63.0 in | Wt 153.4 lb

## 2023-12-09 DIAGNOSIS — Z79899 Other long term (current) drug therapy: Secondary | ICD-10-CM

## 2023-12-09 DIAGNOSIS — K5909 Other constipation: Secondary | ICD-10-CM

## 2023-12-09 DIAGNOSIS — K50018 Crohn's disease of small intestine with other complication: Secondary | ICD-10-CM

## 2023-12-09 DIAGNOSIS — K649 Unspecified hemorrhoids: Secondary | ICD-10-CM | POA: Diagnosis not present

## 2023-12-09 MED ORDER — TRULANCE 3 MG PO TABS: 3.0000 mg | ORAL_TABLET | Freq: Every day | ORAL | 0 refills | Status: DC

## 2023-12-09 MED ORDER — LINACLOTIDE 145 MCG PO CAPS: 145.0000 ug | ORAL_CAPSULE | Freq: Every day | ORAL | 0 refills | Status: DC

## 2023-12-09 NOTE — Progress Notes (Signed)
 HPI :  73 year old female here for a follow up visit for Crohn's disease of the ileum.  Recall she also has history of lichen planus followed by dermatology at [facility], history of arthritis followed by rheumatology   She was previously followed by Dr. Andriette Keeling for Crohn's disease, we have assumed her care in recent years   Crohn's history Dx Crohn's ileitis back in 2019 or so, however she has had ileitis noted on colonoscopies dating back for years before that however biopsies never confirmed the diagnosis. She was on Humira for a few years after her initial diagnosis.  From review of the record it was a question of she was having multiple falls due to demyelination?  However it was continued despite that.  She had multiple orthopedic surgeries in 2021 2022, she had an infection from a knee replacement, had been off Humira for several months and then ultimately just stopped it. She has never had a surgery for Crohn's disease or hospitalization from this. Started [name]  2024   SINCE LAST VISIT   Patient here for visit for her Crohn's disease.  Recall in January 2024 she had a fecal calprotectin over thousand.  We had placed her on [name]  standard dosing.  Colonoscopy with me in December 2024 showed some patchy mild inflammation of her ileum along with some inflammatory polyps.  Repeat fecal calprotectin in January showed the value had decreased to 500s but not normalized.  We have subsequently increased [name]  dosing to 360 mg every 8 weeks.  She is tolerating this okay.  She is due for her next injection in July, having to complete paperwork with [employer] for assistance, I recently completed our part of that she needs to submit the patient part of that.  Since I have last seen her she had a shoulder surgery on April 23.  Recovering from that she had some dyspnea and cough and ultimately was diagnosed with pneumonia.  She was treated with antibiotics and finished that she thinks a few weeks ago.  The  antibiotic she thinks alter her bowel habits lately.  She had been having problems with constipation despite MiraLAX , has been using suppositories and enemas.  She felt that at 1 point on the antibiotic she had some diarrhea, backed off of her bowel regimen and bowels appear to be back to normal.  She states at baseline she has been on the constipated side.  No blood in her stool, she does take an iron tablet and stools are dark at baseline.  We discussed if she wanted to switch her laxative regimen.  Otherwise no abdominal pains.  She thinks her Crohn's has been better controlled on higher dosing of [name]  although she has been through shoulder surgery and antibiotics for pneumonia during that time.  Her vaccinations are up-to-date for pneumococcal, influenza, shingles, etc.  She recently had blood work showing hemoglobin of 14.7, no anemia.  Prior workup:   Colonoscopy 2019 - finding of chronic active ileitis with ulceration and a tubular adenoma.    Review of her medical chart, found earlier ulcerative inflammation of the terminal ileum, at colonoscopy 2010 and 2014. Biospies confirmed active inflammation, but granulomas have not been found. Small bowel series confirmed ileal inflammation, consistent with Crohn's enteritis of the terminal ileum.    MRE 07/2014: IMPRESSION:   1. Long segment of bowel wall thickening, mucosal enhancement and luminal narrowing involving 20 cm of the distal ileum. No evidence of bowel obstruction or proximal dilatation. No fecalization. The inflammation is  present on comparison exam but the narrowing has progressed. 2. No fisutala or abscess.     Echocardiogram 02/28/2022 - EF 55-60%,    CT enterography 07/30/22: IMPRESSION: 1. Persistent long segment distal and terminal ileal wall thickening, consistent with the clinical history of Crohn disease. The hyperenhancing terminal ileal component is similar and the extent of mucosal hyperenhancement within the  distal most ileum is decreased compared to 08/25/2014 MR enterography. Upstream dilatation is new/increased, consistent with a component of obstruction/stenosis. 2. No other complication and no new sites of active inflammation identified. 3.  Aortic Atherosclerosis (ICD10-I70.0).     Fecal calprotectin 07/15/22 - 1060    Colonoscopy 06/11/23: - The perianal and digital rectal examinations were normal. - The terminal ileum contained two semi-pedunculated polyps. They both appeared inflammatory with erosions. Biopsies were taken with a cold forceps for histology. - Patchy mild inflammation characterized by a few small erosions was found in the terminal ileum. - A 3 mm polyp was found in the ileocecal valve. The polyp was flat. The polyp was removed with a cold snare. Resection and retrieval were complete. - A 6 mm polyp was found in the ascending colon. The polyp was sessile. The polyp was removed with a cold snare. Resection and retrieval were complete. - Multiple small-mouthed diverticula were found in the transverse colon and left colon. - Internal hemorrhoids were found during retroflexion. - The exam was otherwise without abnormality.  FINAL DIAGNOSIS        1. Surgical [P], small bowel, ileal :       - SEVERELY ACTIVE CHRONIC ILEITIS WITH EROSION, CONSISTENT PRESENTLY HISTORY OF       CROHN'S DISEASE       - NEGATIVE FOR GRANULOMAS OR DYSPLASIA        2. Surgical [P], colon, ascending and ileocecal valve, polyp (2) :       - TUBULAR ADENOMA(S)       - NEGATIVE FOR HIGH-GRADE DYSPLASIA OR MALIGNANCY    Fecal calprotectin 07/30/23: 559   Past Medical History:  Diagnosis Date   Allergy  1966   Anemia    Anxiety 2024   Nerve pain   Arthritis 2003   Rhuematologist   ASCVD (arteriosclerotic cardiovascular disease)    Blood transfusion without reported diagnosis    Hospital-surgery   Cataract 2000's   Surgery   Chronic kidney disease 2020's   [facility] hospital   Closed  nondisplaced subtrochanteric fracture of left femur (HCC) 09/03/2017   September 2018.  Status post surgery by Dr. Guyann Leitz   Crohn's disease of ileum without complication [facility])    DDD (degenerative disc disease), lumbar    Depression 11/27/2010   Son KIA   Dyspnea    Eye infection    Fibromyalgia    Gait disorder 10/26/2012   GERD (gastroesophageal reflux disease) 2009   Gastroenterologist   GI bleed    Glaucoma 2000's   Stable   Hyperlipidemia    Hypertension    Meds   Hypothyroid    Neuromuscular disorder (HCC) 2013   Bells Palsy- right side of face affected   Periprosthetic supracondylar fracture of femur, left  03/23/2017   Pneumonia    Pre-diabetes    Restless leg syndrome    Rhinitis 06/25/2010   Seizures (HCC) ?   TIA   Sleep apnea    does not use CPAP   Spinal headache    with the C section   Stroke (HCC)    Hx TIAs  x 3   last was around 2014   TIA (transient ischemic attack)    3         Ulcer    One   Vitamin D  deficiency      Past Surgical History:  Procedure Laterality Date   ABDOMINAL HYSTERECTOMY  1992   ANTERIOR CERVICAL DECOMPRESSION/DISCECTOMY FUSION 4 LEVELS N/A 06/14/2015   Procedure: Cervical three-four Cervical four-five Cervical five- six Cervical six- seven  Anterior cervical decompression/diskectomy/fusion;  Surgeon: Manya Sells, MD;  Location: [facility];  Service: Neurosurgery;  Laterality: N/A;  C3-4 C4-5 C5-6 C6-7 Anterior cervical decompression/diskectomy/fusion   APPENDECTOMY  1960   BACK SURGERY     surgeries x 3   CATARACT EXTRACTION, BILATERAL     CESAREAN SECTION  1983 & 1886   DRUG INDUCED ENDOSCOPY N/A 02/25/2022   Procedure: DRUG INDUCED SLEEP ENDOSCOPY;  Surgeon: Ammon Bales, MD;  Location:  SURGERY CENTER;  Service: ENT;  Laterality: N/A;   EYE SURGERY     bilateral cataracts   FINGER ARTHROSCOPY     FRACTURE SURGERY  2020   Femur   IMPLANTATION OF HYPOGLOSSAL NERVE STIMULATOR Right 05/29/2022    Procedure: IMPLANTATION OF HYPOGLOSSAL NERVE STIMULATOR;  Surgeon: Ammon Bales, MD;  Location: [facility] OR;  Service: ENT;  Laterality: Right;   JOINT REPLACEMENT     2   LEFT  1 RIGHT    KNEE SURGERY     ORIF FEMUR FRACTURE Left 03/24/2017   Procedure: OPEN REDUCTION INTERNAL FIXATION (ORIF) FEMUR FRACTURE;  Surgeon: Hardy Lia, MD;  Location: [facility] OR;  Service: Orthopedics;  Laterality: Left;   REVERSE SHOULDER ARTHROPLASTY Left 10/21/2023   Procedure: ARTHROPLASTY, SHOULDER, TOTAL, REVERSE;  Surgeon: Micheline Ahr, MD;  Location: [facility] ORS;  Service: Orthopedics;  Laterality: Left;   ROTATOR CUFF REPAIR Left 2005   2 LEFT  1  RIGHT   SHOULDER ADHESION RELEASE     SPINAL CORD DECOMPRESSION  1998   SPINE SURGERY     TOE SURGERY     LEFT        TOE SURGERY     RIGHT  BIG TOE   TONSILLECTOMY  1960   TRANSFORAMINAL LUMBAR INTERBODY FUSION W/ MIS 1 LEVEL Left 09/05/2021   Procedure: MINIMALLY INVASIVE TRANSFORAMINAL LUMBAR INTERBODY FUSION LUMBAR THREE-FOUR; EXPLORE FUSION WITH REVISION OF HARDWARE LUMBAR FOUR-FIVE,LEFT SIDE APPROACH;  Surgeon: Pincus Bridgeman, DO;  Location: [facility] OR;  Service: Neurosurgery;  Laterality: Left;   Family History  Problem Relation Age of Onset   Cancer Mother    Diabetes Mother    Osteoporosis Mother    Breast cancer Mother    Arthritis Mother    Miscarriages / [place] Mother    Obesity Mother    Heart disease Father    Hypertension Father    Hyperlipidemia Father    Neuropathy Father    Stroke Father    Leukemia Sister    Lupus Sister    Early death Sister    Miscarriages / [place] Sister    Depression Sister    Rheum arthritis Sister    Cancer Sister    Miscarriages / [place] Sister    Migraines Daughter    Cancer Daughter    Anxiety disorder Daughter    Cancer Son    Early death Son    Liver disease Neg Hx    Colon cancer Neg Hx    Esophageal cancer Neg Hx    Social History   Tobacco Use  Smoking status: Never    Passive  exposure: Past   Smokeless tobacco: Never  Vaping Use   Vaping status: Never Used  Substance Use Topics   Alcohol use: Not Currently    Alcohol/week: 4.0 standard drinks of alcohol    Types: 2 Cans of beer, 2 Standard drinks or equivalent per week    Comment: On and off   Drug use: No   Current Outpatient Medications  Medication Sig Dispense Refill   acyclovir  (ZOVIRAX ) 400 MG tablet TAKE 1 TABLET BY MOUTH EVERY DAY 90 tablet 1   bisoprolol -hydrochlorothiazide  (ZIAC ) 10-6.25 MG tablet Take 1 tablet by mouth daily. 90 tablet 2   Black Pepper-Turmeric (TURMERIC PLUS BLACK PEPPER EXT PO) Take 1 tablet by mouth daily.     buPROPion  (WELLBUTRIN  XL) 300 MG 24 hr tablet Take 1 tablet (300 mg total) by mouth daily for mood, focus & concentration. 90 tablet 3   carboxymethylcellulose (REFRESH PLUS) 0.5 % SOLN Place 1 drop into both eyes 3 (three) times daily as needed (dry eyes).     clobetasol ointment (TEMOVATE) 0.05 % Apply 1 Application topically 3 (three) times daily as needed (lichen planus).     clotrimazole  (MYCELEX ) 10 MG troche Dissolve 1 tablet (10 mg total) in mouth 3 (three) times daily. 140 Troche 11   dexamethasone  (DECADRON ) 0.5 MG/5ML solution Take 5 mLs (0.5 mg total) by mouth 3 (three) times daily. (Patient taking differently: Take 0.5 mg by mouth 2 (two) times daily.) 100 mL 1   diclofenac  Sodium (VOLTAREN ) 1 % GEL Apply 2 grams topically daily as needed (pain). 100 g 1   DULoxetine  (CYMBALTA ) 60 MG capsule Take 1 capsule (60 mg total) by mouth daily for chronic pain. 90 capsule 3   fenofibrate  micronized (LOFIBRA) 134 MG capsule Take 1 capsule (134 mg total) by mouth daily before breakfast 90 capsule 3   Ferrous Sulfate (IRON PO) Take 25 mg by mouth daily.     fluconazole  (DIFLUCAN ) 200 MG tablet Take 1 tablet by mouth daily.     fluticasone  (FLONASE ) 50 MCG/ACT nasal spray Use  1-2 Sprays  each nostril   1-2 x /day  as needed 16 g 5   gabapentin  (NEURONTIN ) 300 MG capsule Take   1 capsule  3 x /day  or Chronic Pain    (Dx: m79.7)                                               /                                TAKE                                   BY                                  MOUTH     Glycerin, Laxative, (FLEET LIQUID GLYCERIN SUPP RE) Place 1 Dose rectally daily as needed (Constipation).     levothyroxine  (SYNTHROID ) 88 MCG tablet Take 1 tablet (88 mcg total) by mouth daily on an empty stomach with water  for 30 minutes.  No antacids, calcium ,  magnesium  for 4 hours. Avoid biotin. 90 tablet 3   meclizine  (ANTIVERT ) 25 MG tablet TAKE 1 TABLET (25 MG TOTAL) BY MOUTH 3 (THREE) TIMES DAILY AS NEEDED FOR DIZZINESS. 90 tablet 0   mupirocin ointment (BACTROBAN) 2 % Apply 1 Application topically 3 (three) times daily as needed (lichen planus).     neomycin -polymyxin-hydrocortisone (CORTISPORIN) OTIC solution Place 3 drops into both ears daily as needed (ear inflammation).     omeprazole  (PRILOSEC) 40 MG capsule Take 1 capsule (40 mg total) by mouth daily. 90 capsule 3   OVER THE COUNTER MEDICATION Place 1 drop under the tongue daily. D3 5000 units - K2 120 mcg     oxybutynin  (DITROPAN ) 5 MG tablet Take 1 tablet 2 x /day fr Excessive Sweating 180 tablet 1   Rectal Protectant-Emollient (CALMOL-4) 76-10 % SUPP Use as directed once to twice daily (Patient taking differently: Place 1 Dose rectally daily as needed (Hemorrhoids).)     Risankizumab -rzaa ([name] ) 360 MG/2.4ML SOCT Inject 1 Cartridge into the skin every 8 (eight) weeks. 2.4 mL 5   rOPINIRole  (REQUIP ) 2 MG tablet Take 1 tablet (2 mg total) by mouth 2 (two) times daily AND 2 tablets (4 mg total) at bedtime for restless legs 360 tablet 3   tizanidine  (ZANAFLEX ) 2 MG capsule Take 1 capsule (2 mg total) by mouth at bedtime for muscle spasm. 90 capsule 3   traMADol  (ULTRAM ) 50 MG tablet Take 1-2 tablets (50-100 mg total) by mouth every 6 (six) hours as needed (for SEVERE pain ONLY.). NO REFILLS 30 tablet 0   Vitamin D -Vitamin  K (K2-D3 5000 PO) Take 5,000 Units by mouth daily.     polyethylene glycol (MIRALAX  / GLYCOLAX ) 17 g packet Take 17-51 g by mouth 3 (three) times daily. Take 3 doses daily (Patient not taking: Reported on 12/09/2023)     promethazine  (PHENERGAN ) 25 MG tablet Take 1 tablet (25 mg total) by mouth every 6 (six) hours as needed for nausea or vomiting. (Patient not taking: Reported on 12/09/2023) 10 tablet 0   No current facility-administered medications for this visit.   Allergies  Allergen Reactions   Methocarbamol  Shortness Of Breath, Nausea Only and Other (See Comments)    Other reaction(s): Dizziness, psychological reaction, disoriented Sleepy and weight loss    Codeine Other (See Comments)    Headache   Statins Other (See Comments)    Extreme pain   Azathioprine    Other Other (See Comments)    MINT=sore mouth and stomach upset   Oxycodone      Dizziness, unbalanced.   Amitriptyline Other (See Comments)    Confusion and hallucinations   Erythromycin Base Rash    Flushing/red face.   Fish Oil Nausea Only   Flax Seed [Flax Seed Oil] Rash   Flaxseed (Linseed) Rash   Hydrocodone  Itching and Rash        Morphine Nausea And Vomiting and Rash   Nsaids Nausea Only   Nuvigil [Armodafinil] Other (See Comments)    Unspecified   Sertraline Rash   Sinemet [Carbidopa-Levodopa] Rash   Sulfa Antibiotics Rash   Tolmetin Nausea Only     Review of Systems: All systems reviewed and negative except where noted in HPI.   Lab Results  Component Value Date   WBC 9.4 11/10/2023   HGB 14.7 11/10/2023   HCT 44.0 11/10/2023   MCV 96.1 11/10/2023   PLT 365.0 11/10/2023    Lab Results  Component Value Date   NA 140 11/26/2023  CL 102 11/26/2023   K 4.1 11/26/2023   CO2 28 11/26/2023   BUN 20 11/26/2023   CREATININE 0.84 11/26/2023   GFR 68.10 11/26/2023   CALCIUM  9.6 11/26/2023   ALBUMIN  4.4 11/26/2023   GLUCOSE 110 (H) 11/26/2023    Lab Results  Component Value Date   ALT 10  11/26/2023   AST 19 11/26/2023   ALKPHOS 48 11/26/2023   BILITOT 0.5 11/26/2023     Physical Exam: BP 114/68   Pulse (!) 51   Ht 5' 3 (1.6 m)   Wt 153 lb 6 oz (69.6 kg)   SpO2 96%   BMI 27.17 kg/m  Constitutional: Pleasant,well-developed, female in no acute distress. Neurological: Alert and oriented to person place and time. Psychiatric: Normal mood and affect. Behavior is normal.   ASSESSMENT: 73 y.o. female here for assessment of the following  1. Crohn's disease of small intestine with other complication (HCC)   2. High risk medication use   3. Chronic constipation   4. Hemorrhoids, unspecified hemorrhoid type    Small bowel Crohn's disease, remotely on Humira, on [name]  over the past year.  This has reduced her fecal calprotectin, had some mild inflammation in her ileum noted on last colonoscopy and we increased dosing to 360 mg every 8 weeks.  She seems to be doing okay with this, I did have a shoulder surgery and postoperatively in recent months had community-acquired pneumonia as well.  She has recovered from that.  She had some altered bowel in the setting of antibiotic use.  At baseline she has had constipation.  Despite using MiraLAX  and some enemas still having some constipation at times. Had intolerance to Linzess  in the past.  Will give her some Trulance samples we have to see if that would be better tolerated.  Her hemorrhoids can bother her when constipated, gave her more samples of Calmol 4 suppositories use as needed.  We will plan on continuing [name]  at current dosing.  We have completed paperwork for financial assistance, she needs to submit her half of this to be processed.  Hopefully she does not have any gaps in her dosing, next doing July.  I asked her to submit a fecal calprotectin later this month so we can continue to trend and hopefully make progress and improvement.  I reviewed her vaccinations which are up-to-date in the setting of [name] .  She  understands she is at higher risk for infection on immunosuppressive regimen.   PLAN: - continue [name]  360mg  q 8 weeks. - paperwork completed for financial assistance for [name]  - continue Miralax , can increase dose PRN - Trulance samples to use PRN, she can use this in place of Miralax  if she likes it - Calmol 4 suppositories PRN - fecal calprotectin to be done at her convenience in the upcoming weeks - vaccinations UTD - f/u 6 months or sooner with issues  Christi Coward, MD [facility]

## 2023-12-09 NOTE — Telephone Encounter (Signed)
 Application for Patient Assistance for [name]  signed by patient and faxed to [phone] (sent with updated medication list).

## 2023-12-09 NOTE — Patient Instructions (Addendum)
 We have given you samples of the following medication to take:  Trulance   We have given you samples of the following medication to take: Calmol4 suppositories - use rectally as needed  Continue Miralax    Please go to the lab for a fecal calprotectin stool test within the month.  Thank you for entrusting me with your care and for choosing [facility], Dr. Alvester Johnson    If your blood pressure at your visit was 140/90 or greater, please contact your primary care physician to follow up on this. ______________________________________________________  If you are age 46 or older, your body mass index should be between 23-30. Your Body mass index is 27.17 kg/m. If this is out of the aforementioned range listed, please consider follow up with your Primary Care Provider.  If you are age 55 or younger, your body mass index should be between 19-25. Your Body mass index is 27.17 kg/m. If this is out of the aformentioned range listed, please consider follow up with your Primary Care Provider.  ________________________________________________________  The [place] [facility] providers would like to encourage you to use MYCHART to communicate with providers for non-urgent requests or questions.  Due to long hold times on the telephone, sending your provider a message by [facility] may be a faster and more efficient way to get a response.  Please allow 48 business hours for a response.  Please remember that this is for non-urgent requests.  _______________________________________________________  Due to recent changes in healthcare laws, you may see the results of your imaging and laboratory studies on MyChart before your provider has had a chance to review them.  We understand that in some cases there may be results that are confusing or concerning to you. Not all laboratory results come back in the same time frame and the provider may be waiting for multiple results in order to interpret others.   Please give us  48 hours in order for your provider to thoroughly review all the results before contacting the office for clarification of your results.

## 2023-12-15 ENCOUNTER — Ambulatory Visit (INDEPENDENT_AMBULATORY_CARE_PROVIDER_SITE_OTHER)

## 2023-12-15 ENCOUNTER — Other Ambulatory Visit

## 2023-12-15 DIAGNOSIS — J189 Pneumonia, unspecified organism: Secondary | ICD-10-CM

## 2023-12-15 DIAGNOSIS — Z79899 Other long term (current) drug therapy: Secondary | ICD-10-CM | POA: Diagnosis not present

## 2023-12-15 DIAGNOSIS — K50018 Crohn's disease of small intestine with other complication: Secondary | ICD-10-CM | POA: Diagnosis not present

## 2023-12-15 DIAGNOSIS — M25512 Pain in left shoulder: Secondary | ICD-10-CM | POA: Diagnosis not present

## 2023-12-16 ENCOUNTER — Telehealth: Payer: Self-pay | Admitting: Primary Care

## 2023-12-16 ENCOUNTER — Encounter: Payer: Self-pay | Admitting: Family Medicine

## 2023-12-16 DIAGNOSIS — G4733 Obstructive sleep apnea (adult) (pediatric): Secondary | ICD-10-CM

## 2023-12-16 NOTE — Telephone Encounter (Signed)
 Copied from CRM [id number]. Topic: General - Other >> Dec 16, 2023  1:26 PM Whitney O wrote: Reason for CRM: patient is calling because she been out of town and now she is back and suppose to have a sleep study done for her inspire machine . Please reach out to patient to get her scheduled for sleep study test  [phone]  Does pt need new ov prior to ordering this sleep study for us  to sch?   Please advise

## 2023-12-16 NOTE — Telephone Encounter (Signed)
 Send message to Dr. Linder Revere, I haven't seen for sleep since January. Not sure what this is referencing

## 2023-12-17 LAB — CALPROTECTIN, FECAL: Calprotectin, Fecal: 503 ug/g — ABNORMAL HIGH (ref 0–120)

## 2023-12-18 ENCOUNTER — Other Ambulatory Visit: Payer: Self-pay

## 2023-12-18 ENCOUNTER — Other Ambulatory Visit (HOSPITAL_COMMUNITY): Payer: Self-pay

## 2023-12-18 ENCOUNTER — Encounter: Payer: Self-pay | Admitting: Gastroenterology

## 2023-12-18 ENCOUNTER — Ambulatory Visit: Payer: Self-pay | Admitting: Gastroenterology

## 2023-12-18 DIAGNOSIS — K50018 Crohn's disease of small intestine with other complication: Secondary | ICD-10-CM

## 2023-12-18 NOTE — Telephone Encounter (Signed)
 HST will be scheduled for Nov/Dec. We will reach out to patient to schedule the appointment prior to her Inspire 1 year  follow-up in January.

## 2023-12-18 NOTE — Telephone Encounter (Signed)
 Called patient.  Gave information regarding ordering a HST.  Patient informed to make sure she turns on her Inspire Device during the HST.  Patient verbalized understanding.  Placed order this encounter.  Added in comments section for HST to be done on Inspire.

## 2023-12-18 NOTE — Telephone Encounter (Signed)
 Order- Schedule home sleep test on Inspire for Inspire assessment update.  Notify our PCCs.   Dxx OSA

## 2023-12-19 ENCOUNTER — Other Ambulatory Visit (HOSPITAL_COMMUNITY): Payer: Self-pay

## 2023-12-21 NOTE — Progress Notes (Signed)
 I called and spoke to pt. Pt informed of Beth's note. Pt verbalized understanding. NFN

## 2023-12-21 NOTE — Progress Notes (Signed)
 Please let patient know CXR showed clear lungs, no evidence of residual pneumonia

## 2023-12-22 NOTE — Telephone Encounter (Signed)
 Contacted radiology to schedule MREnterography. Patient has hypoglossal stimulator (for OSA). Per radiology, they will make sure this is compatable with MRI machine and will then reach out to patient to schedule.   Per radiology notes,  6/24 - lvm-mx  per Bernardino on to schedule - bring remote fully charged - mx

## 2023-12-23 ENCOUNTER — Other Ambulatory Visit: Payer: Self-pay | Admitting: Medical Genetics

## 2023-12-23 DIAGNOSIS — M25512 Pain in left shoulder: Secondary | ICD-10-CM | POA: Diagnosis not present

## 2023-12-24 ENCOUNTER — Ambulatory Visit (HOSPITAL_COMMUNITY)
Admission: RE | Admit: 2023-12-24 | Discharge: 2023-12-24 | Disposition: A | Discharge status: Home / Self Care | Source: Ambulatory Visit | Attending: Gastroenterology | Admitting: Gastroenterology

## 2023-12-24 ENCOUNTER — Other Ambulatory Visit (HOSPITAL_COMMUNITY): Payer: Self-pay

## 2023-12-24 ENCOUNTER — Other Ambulatory Visit: Payer: Self-pay | Admitting: Nurse Practitioner

## 2023-12-24 DIAGNOSIS — K50018 Crohn's disease of small intestine with other complication: Secondary | ICD-10-CM | POA: Diagnosis present

## 2023-12-24 DIAGNOSIS — K5730 Diverticulosis of large intestine without perforation or abscess without bleeding: Secondary | ICD-10-CM | POA: Diagnosis not present

## 2023-12-24 DIAGNOSIS — Z90710 Acquired absence of both cervix and uterus: Secondary | ICD-10-CM | POA: Diagnosis not present

## 2023-12-24 DIAGNOSIS — K7689 Other specified diseases of liver: Secondary | ICD-10-CM | POA: Diagnosis not present

## 2023-12-24 MED ORDER — GADOBUTROL 1 MMOL/ML IV SOLN
7.0000 mL | Freq: Once | INTRAVENOUS | Status: AC | PRN
  Administered 2023-12-24: 7 mL via INTRAVENOUS

## 2023-12-25 ENCOUNTER — Other Ambulatory Visit (HOSPITAL_COMMUNITY): Payer: Self-pay

## 2023-12-26 ENCOUNTER — Other Ambulatory Visit (HOSPITAL_COMMUNITY): Payer: Self-pay

## 2023-12-27 ENCOUNTER — Ambulatory Visit: Payer: Self-pay | Admitting: Gastroenterology

## 2023-12-30 ENCOUNTER — Other Ambulatory Visit (HOSPITAL_COMMUNITY)
Admission: RE | Admit: 2023-12-30 | Discharge: 2023-12-30 | Disposition: A | Discharge status: Home / Self Care | Payer: Self-pay | Source: Ambulatory Visit | Attending: Medical Genetics | Admitting: Medical Genetics

## 2023-12-30 ENCOUNTER — Other Ambulatory Visit (HOSPITAL_COMMUNITY): Payer: Self-pay

## 2024-01-02 ENCOUNTER — Other Ambulatory Visit (HOSPITAL_COMMUNITY): Payer: Self-pay

## 2024-01-05 ENCOUNTER — Other Ambulatory Visit (HOSPITAL_COMMUNITY): Payer: Self-pay

## 2024-01-05 ENCOUNTER — Other Ambulatory Visit: Payer: Self-pay

## 2024-01-05 DIAGNOSIS — M25512 Pain in left shoulder: Secondary | ICD-10-CM | POA: Diagnosis not present

## 2024-01-09 ENCOUNTER — Other Ambulatory Visit (HOSPITAL_COMMUNITY): Payer: Self-pay

## 2024-01-12 LAB — GENECONNECT MOLECULAR SCREEN: Genetic Analysis Overall Interpretation: NEGATIVE

## 2024-01-13 DIAGNOSIS — M25512 Pain in left shoulder: Secondary | ICD-10-CM | POA: Diagnosis not present

## 2024-01-14 ENCOUNTER — Other Ambulatory Visit: Payer: Self-pay

## 2024-01-14 ENCOUNTER — Other Ambulatory Visit: Payer: Self-pay | Admitting: Family Medicine

## 2024-01-14 ENCOUNTER — Other Ambulatory Visit (HOSPITAL_COMMUNITY): Payer: Self-pay

## 2024-01-14 MED ORDER — BUPROPION HCL ER (XL) 300 MG PO TB24
300.0000 mg | ORAL_TABLET | Freq: Every day | ORAL | 0 refills | Status: DC
  Filled 2024-01-14: qty 90, 90d supply, fill #0

## 2024-01-15 ENCOUNTER — Encounter: Payer: Self-pay | Admitting: Advanced Practice Midwife

## 2024-01-20 DIAGNOSIS — M25512 Pain in left shoulder: Secondary | ICD-10-CM | POA: Diagnosis not present

## 2024-01-21 ENCOUNTER — Ambulatory Visit: Admitting: Podiatry

## 2024-01-21 ENCOUNTER — Encounter: Payer: Self-pay | Admitting: Podiatry

## 2024-01-21 ENCOUNTER — Ambulatory Visit (INDEPENDENT_AMBULATORY_CARE_PROVIDER_SITE_OTHER)

## 2024-01-21 ENCOUNTER — Ambulatory Visit: Payer: Self-pay | Admitting: Primary Care

## 2024-01-21 ENCOUNTER — Ambulatory Visit (INDEPENDENT_AMBULATORY_CARE_PROVIDER_SITE_OTHER): Admitting: Podiatry

## 2024-01-21 DIAGNOSIS — M722 Plantar fascial fibromatosis: Secondary | ICD-10-CM | POA: Diagnosis not present

## 2024-01-21 DIAGNOSIS — L600 Ingrowing nail: Secondary | ICD-10-CM

## 2024-01-21 DIAGNOSIS — R059 Cough, unspecified: Secondary | ICD-10-CM

## 2024-01-21 DIAGNOSIS — J189 Pneumonia, unspecified organism: Secondary | ICD-10-CM

## 2024-01-21 MED ORDER — GABAPENTIN 800 MG PO TABS
800.0000 mg | ORAL_TABLET | Freq: Every day | ORAL | 3 refills | Status: DC
Start: 2024-01-21 — End: 2024-05-04

## 2024-01-21 NOTE — Telephone Encounter (Signed)
 FYI Only or Action Required?: Action required by provider: update on patient condition./FYI  Patient was last seen in primary care on 11/26/2023 by Lendia Boby CROME, NP-C.  Called Nurse Triage reporting Excessive Sweating.  Symptoms began several months ago.  Interventions attempted: Nothing.  Symptoms are: gradually worsening.  Triage Disposition: Call Specialist Today, See PCP Within 2 Weeks  Patient/caregiver understands and will follow disposition?: Yes     Reason for Disposition  [1] Caller requesting NON-URGENT health information AND [2] PCP's office is the best resource  Excessive sweating is a chronic symptom (recurrent or ongoing AND present > 4 weeks)  Answer Assessment - Initial Assessment Questions 1. REASON FOR CALL: What is the main reason for your call? or How can I best help you?     Pt having issues with Inspire not downloading information -- pt states she threw away the box Triager provided info: [facility] Customer Service   [phone] 2. SYMPTOMS : Do you have any symptoms?      I'm not sleeping at night - increasing in frequency 3. OTHER QUESTIONS: Do you have any other questions?     Pt reports she may need new sleep study  Answer Assessment - Initial Assessment Questions 1. ONSET: When did the sweating start?      3-4 months - worsening 2. LOCATION: What part of your body has excessive sweating? (e.g., entire body; just face, underarms, palms, or soles of feet).      Starts in chest area, then to face and back - eventually entire body 3. SEVERITY: How bad is the sweating?    (Scale 1-10; or mild, moderate, severe)     9-10/10 Endorses having to change clothes, soaked hair 4. CAUSE: What do you think is causing the sweating?     Thought r/t possible medication or lack of sleep/Inspire device  5. FEVER: Have you been having fevers? If Yes, ask: What is your temperature, how was it measured, and when did it start?      Denies Reports hx of PNA 6. OTHER SYMPTOMS: Do you have any other symptoms? (e.g., chest pain, difficulty breathing, lightheadedness, weight loss)     H/A, difficulty breathing - unknown if r.t allergies, weight GAIN Endorses orthostatic BP, vertigo    Pt having 3 separate issues: Inspire download complications and concern for needing new Sleep Study, as well as excessive sweating. Pt reports has been an ongoing issue for a few months and has started to worsen-- she is unsure if r/t lack of sleep/Inspire Device or other etiology. Triager provided [employer] number to help troubleshoot device and scheduled the next available acute appt on 02/03/2024 with PCP.  Triager will forward encounter for Hope, NP 's office to review and advise if new Sleep Study is needed. Patient verbalized understanding and is expecting call back from office for next steps.    Additionally, Triager advised pt to call back if [employer] unsuccessful. Patient verbalized understanding and to call back with worsening symptoms.  Protocols used: Sweating-A-AH, Information Only Call - No Triage-A-AH

## 2024-01-21 NOTE — Telephone Encounter (Signed)
 Second attempt to contact pt, no ringing, straight to voicemail. LVM for call back to [facility] office.

## 2024-01-21 NOTE — Patient Instructions (Signed)

## 2024-01-21 NOTE — Progress Notes (Signed)
 Subjective:   Patient ID: Crystal [name], female   DOB: 73 y.o.   MRN: [id number]   HPI Patient presents stating that she is getting ingrown toenails of both big toes that becomes bothersome and her nerves are bothering her more at night she is having more trouble sleeping   ROS      Objective:  Physical Exam  Neurovascular status intact with patient found to have incurvation of the medial borders of the hallux bilateral with pain with pressure and has neuropathic symptomatology and takes gabapentin  3 times a day     Assessment:  Chronic ingrown toenail deformity big toes both feet medial border with neuropathy that does not appear to be well-controlled at night     Plan:  H&P reviewed organ to work on the medial borders of the big toe to try to help alleviate the pain and I am going to increase her gabapentin  at nighttime.  Today I went ahead but I explained gabapentin  increasing dose at night and I would have her stay on 300 mg during the day and do 800 at night to help her sleep and hopefully reduce symptomatology.  I have recommended removal of these ingrown toenails due to discomfort and I explained procedure risk patient read signed consent form understanding risk and today I infiltrated each big toe 60 mg like a Marcaine  mixture sterile prep done using sterile instrumentation remove the medial borders exposed matrix applied phenol 3 applications 30 seconds followed by alcohol sterile dressing gave instructions on soaks wear dressings 24 hours take them off earlier if needed and encouraged to call with questions concerns which may arise

## 2024-01-21 NOTE — Telephone Encounter (Signed)
 First attempt to contact pt, no ringing, straight to voicemail. LVM for call back to [facility] office.    Message from Isabell A sent at 01/21/2024 11:00 AM EDT  Patient states her body is over heating and she starts sweating - she is requesting to  speak to a nurse about this.  Callback number: [phone]

## 2024-01-22 ENCOUNTER — Telehealth: Payer: Self-pay | Admitting: *Deleted

## 2024-01-22 NOTE — Telephone Encounter (Signed)
 Copied from CRM [id number]. Topic: Clinical - Pink Word Triage >> [name] 21, 2024 10:59 AM Crystal [name] wrote: Reason for Triage: Patient states her body is over heating and she starts sweating - she is requesting to  speak to [name] nurse about this.   Callback number: [phone] >> [name] 21, 2024 11:41 AM Crystal [name] wrote: Patient returning call that she received today getting paTient transferred over to triage  >> [name] 21, 2024 11:00 AM Crystal [name] wrote: Patient states her body is over heating and she starts sweating - she is requesting to  speak to [name] nurse about this.   Callback number: [phone]   Called and spoke with the patient. Pt states she has body sweat all day and night. Pt spoke with Cierra (inspire) and scheduled new sleep study. Pt states she has started taking one tablet oxybutynin  instead of two tablets. Pt states she thinks this may be causing sweating irritation. Pt has appt with PCP  Crystal [name], patient would like to know if you have any recommendations to this and stated I trust Almarie with my whole life I need her opinion  [name] can you please advise.

## 2024-01-22 NOTE — Telephone Encounter (Signed)
 Patient needs assistance she says she threw box out and doesn't know whom she is to return device to.   Order- Schedule home sleep test on Inspire for Inspire assessment update. Notify our PCCs.   Dxx OSA

## 2024-01-22 NOTE — Telephone Encounter (Signed)
 I would get CBC with diff and if she is having any respiratory symptoms a CXR due to history pneumonia  Make sure she is staying hydrated and checks her temperature, take tylenol  650mg  every 6 hours for fever if needed

## 2024-01-22 NOTE — Telephone Encounter (Signed)
 Reached out to the patient to confirm what she needed in regards what item she had discarded. She confirmed it was the INSPIRE box that holds her remote. I provided her with the contact number to see if she can request a replacement box and assist her with re-downloading the INSPIRE app on her phone. Additionally, we scheduled her for the HST WatchPat prior to her one year follow-up in January. Nothing further is needed at this time.

## 2024-01-25 DIAGNOSIS — M25512 Pain in left shoulder: Secondary | ICD-10-CM | POA: Diagnosis not present

## 2024-01-25 DIAGNOSIS — Z1231 Encounter for screening mammogram for malignant neoplasm of breast: Secondary | ICD-10-CM | POA: Diagnosis not present

## 2024-01-25 LAB — HM MAMMOGRAPHY

## 2024-01-25 NOTE — Telephone Encounter (Signed)
 Called and spoke with Bruna. Pt states she is having ongoing productive coughing w/ phlegm. I am placing lab & DG orders. Lab appt has been scheduled 7/29 & pt is aware.

## 2024-01-26 ENCOUNTER — Ambulatory Visit

## 2024-01-26 ENCOUNTER — Other Ambulatory Visit (INDEPENDENT_AMBULATORY_CARE_PROVIDER_SITE_OTHER)

## 2024-01-26 DIAGNOSIS — Z96612 Presence of left artificial shoulder joint: Secondary | ICD-10-CM | POA: Diagnosis not present

## 2024-01-26 DIAGNOSIS — M19012 Primary osteoarthritis, left shoulder: Secondary | ICD-10-CM | POA: Diagnosis not present

## 2024-01-26 DIAGNOSIS — R059 Cough, unspecified: Secondary | ICD-10-CM | POA: Diagnosis not present

## 2024-01-26 DIAGNOSIS — Z96611 Presence of right artificial shoulder joint: Secondary | ICD-10-CM | POA: Diagnosis not present

## 2024-01-26 DIAGNOSIS — R9389 Abnormal findings on diagnostic imaging of other specified body structures: Secondary | ICD-10-CM | POA: Diagnosis not present

## 2024-01-26 LAB — CBC WITH DIFFERENTIAL/PLATELET
Basophils Absolute: 0.1 K/uL (ref 0.0–0.1)
Basophils Relative: 1.1 % (ref 0.0–3.0)
Eosinophils Absolute: 0.1 K/uL (ref 0.0–0.7)
Eosinophils Relative: 1.9 % (ref 0.0–5.0)
HCT: 45.5 % (ref 36.0–46.0)
Hemoglobin: 15 g/dL (ref 12.0–15.0)
Lymphocytes Relative: 23.9 % (ref 12.0–46.0)
Lymphs Abs: 1.7 K/uL (ref 0.7–4.0)
MCHC: 33.1 g/dL (ref 30.0–36.0)
MCV: 93.4 fl (ref 78.0–100.0)
Monocytes Absolute: 0.5 K/uL (ref 0.1–1.0)
Monocytes Relative: 7.9 % (ref 3.0–12.0)
Neutro Abs: 4.5 K/uL (ref 1.4–7.7)
Neutrophils Relative %: 65.2 % (ref 43.0–77.0)
Platelets: 299 K/uL (ref 150.0–400.0)
RBC: 4.87 Mil/uL (ref 3.87–5.11)
RDW: 14.1 % (ref 11.5–15.5)
WBC: 6.9 K/uL (ref 4.0–10.5)

## 2024-01-26 NOTE — Telephone Encounter (Signed)
 Crystal [name]

## 2024-01-27 DIAGNOSIS — M25512 Pain in left shoulder: Secondary | ICD-10-CM | POA: Diagnosis not present

## 2024-01-28 DIAGNOSIS — E119 Type 2 diabetes mellitus without complications: Secondary | ICD-10-CM | POA: Diagnosis not present

## 2024-01-28 DIAGNOSIS — H52203 Unspecified astigmatism, bilateral: Secondary | ICD-10-CM | POA: Diagnosis not present

## 2024-01-28 DIAGNOSIS — Z961 Presence of intraocular lens: Secondary | ICD-10-CM | POA: Diagnosis not present

## 2024-01-28 DIAGNOSIS — H40013 Open angle with borderline findings, low risk, bilateral: Secondary | ICD-10-CM | POA: Diagnosis not present

## 2024-01-28 LAB — HM DIABETES EYE EXAM

## 2024-01-29 ENCOUNTER — Other Ambulatory Visit: Payer: Self-pay | Admitting: Podiatry

## 2024-01-29 ENCOUNTER — Encounter: Payer: Self-pay | Admitting: Podiatry

## 2024-01-29 MED ORDER — GABAPENTIN 600 MG PO TABS: 600.0000 mg | ORAL_TABLET | Freq: Every day | ORAL | 5 refills | Status: DC

## 2024-01-29 NOTE — Telephone Encounter (Signed)
 done

## 2024-02-01 ENCOUNTER — Ambulatory Visit: Payer: Self-pay | Admitting: Primary Care

## 2024-02-01 DIAGNOSIS — M25512 Pain in left shoulder: Secondary | ICD-10-CM | POA: Diagnosis not present

## 2024-02-02 ENCOUNTER — Telehealth (HOSPITAL_BASED_OUTPATIENT_CLINIC_OR_DEPARTMENT_OTHER): Payer: Self-pay

## 2024-02-02 NOTE — Telephone Encounter (Signed)
Left pt message to return call 

## 2024-02-02 NOTE — Telephone Encounter (Signed)
 Duplicate

## 2024-02-02 NOTE — Telephone Encounter (Signed)
 Copied from CRM #[id number]. Topic: Clinical - Lab/Test Results >> Feb 02, 2024 11:33 AM Russell PARAS wrote: Reason for CRM:   Pt requesting call back to review lab results/chest x-ray from 07/29  CB#  [phone]

## 2024-02-03 ENCOUNTER — Ambulatory Visit: Admitting: Family Medicine

## 2024-02-03 ENCOUNTER — Encounter: Payer: Self-pay | Admitting: Family Medicine

## 2024-02-03 VITALS — BP 120/72 | HR 78 | Temp 97.6°F | Ht 63.0 in | Wt 152.0 lb

## 2024-02-03 DIAGNOSIS — R2689 Other abnormalities of gait and mobility: Secondary | ICD-10-CM | POA: Diagnosis not present

## 2024-02-03 DIAGNOSIS — N3010 Interstitial cystitis (chronic) without hematuria: Secondary | ICD-10-CM | POA: Diagnosis not present

## 2024-02-03 DIAGNOSIS — E1122 Type 2 diabetes mellitus with diabetic chronic kidney disease: Secondary | ICD-10-CM | POA: Diagnosis not present

## 2024-02-03 DIAGNOSIS — R232 Flushing: Secondary | ICD-10-CM | POA: Diagnosis not present

## 2024-02-03 DIAGNOSIS — M25512 Pain in left shoulder: Secondary | ICD-10-CM | POA: Diagnosis not present

## 2024-02-03 DIAGNOSIS — E1169 Type 2 diabetes mellitus with other specified complication: Secondary | ICD-10-CM | POA: Diagnosis not present

## 2024-02-03 DIAGNOSIS — E785 Hyperlipidemia, unspecified: Secondary | ICD-10-CM

## 2024-02-03 DIAGNOSIS — R42 Dizziness and giddiness: Secondary | ICD-10-CM | POA: Diagnosis not present

## 2024-02-03 DIAGNOSIS — Z87898 Personal history of other specified conditions: Secondary | ICD-10-CM | POA: Diagnosis not present

## 2024-02-03 DIAGNOSIS — R61 Generalized hyperhidrosis: Secondary | ICD-10-CM

## 2024-02-03 NOTE — Progress Notes (Signed)
 Subjective:     Patient ID: Crystal [name], female    DOB: 1950/12/31, 73 y.o.   MRN: [id number]  Chief Complaint  Patient presents with   Excessive Sweating    Getting overheated all of a sudden getting clamy for no reason. Since 2012, taking oxybutynin  for this but only taking it once a day as she was trying to get off of it, doesn't know if she needs to go back to BID   Dizziness    Having problems with balance since her surgery a few years ago, diagnosed w vertigo. Went to [facility] and they couldn't find a reason for it    HPI  Discussed the use of AI scribe software for clinical note transcription with the patient, who gave verbal consent to proceed.  History of Present Illness Crystal [name] is a 73 year old female who presents with dizziness and excessive sweating.   Vertigo and dizziness - Worsening dizziness, particularly when lying down, putting head back, turning onto right side, sitting up, and standing - Notable episode this morning with near fall from bed - History of vertigo and inner ear problems, including frequent ear infections and narrowing ear canal - Previous physical therapy including Epley maneuver at [facility] - No recent evaluation by ENT since previous physician retired - Prior evaluation by [facility] for dizziness - No syncope, chest pain or stroke-like symptoms  Excessive sweating - Excessive sweating since 2012 - Initially attributed to fibromyalgia, now suspected to be related to menopausal hot flashes - No uterus or ovaries - Sweating improved with oxybutynin  twice daily for overactive bladder and interstitial cystitis (diagnosed in [place]) - Sweating worsened after reducing oxybutynin  to once daily; plans to resume twice daily dosing  Hyperlipidemia and weight gain - Elevated cholesterol managed with fenofibrate  due to statin intolerance - Cholesterol remains elevated despite dietary  modifications - Significant weight gain over past few years, from 132 lbs to 152 lbs, with previous peak higher than current weight - Actively working on [employer] management  Balance impairment and falls - History of multiple falls, with last significant fall approximately three years ago resulting in deviated septum - Ongoing balance issues and lower extremity weakness - Currently undergoing physical therapy for shoulder and balance issues  History of multiple surgeries - History of knee, back, foot, hand, and shoulder surgeries  Last CPE 05/2023   Other providers: Dr. Shelah - Pulmonology  Dr. Leigh- GI  Dermatologist  Urologist in the past for urinary incontinence  Rheumatologist- sees [facility] rheumatology for OA and not RA per patient  [facility]      PMH hx includes DM II- diet controlled, CAD, hypertension, CKD, fibromyalgia, GERD, restless leg syndrome, OSA not on CPAP. She is followed by Dr. Neysa for obstructive sleep apnea, rhinitis, bronchiectasis. She has an inspire device.   HLD- fenofibrate  (statin myopathy)  Crohn's disease -taking [name]     Vitamin D  5,000 IUs daily    Fibromyalgia -takes duloxetine  daily and gabapentin  3 times daily.  Neuropathy bilaterally  RLS -takes Requip     Health Maintenance Due  Topic Date Due   Medicare Annual Wellness (AWV)  08/20/2023   INFLUENZA VACCINE  01/29/2024    Past Medical History:  Diagnosis Date   Allergy  1966   Anemia    Anxiety 2024   Nerve pain   Arthritis 2003   Rhuematologist   ASCVD (arteriosclerotic cardiovascular disease)    Blood transfusion without reported diagnosis  Hospital-surgery   Cataract 2000's   Surgery   Chronic kidney disease 2020's   [facility] long hospital   Closed nondisplaced subtrochanteric fracture of left femur [facility]) 09/03/2017   September 2018.  Status post surgery by Dr. Celena   Crohn's disease of ileum without complication [facility])    DDD (degenerative disc  disease), lumbar    Depression 11/27/2010   Son Crystal [name]   Dyspnea    Eye infection    Fibromyalgia    Gait disorder 10/26/2012   GERD (gastroesophageal reflux disease) 2009   Gastroenterologist   GI bleed    Glaucoma 2000's   Stable   Hyperlipidemia    Hypertension    Meds   Hypothyroid    Neuromuscular disorder (HCC) 2013   Bells Palsy- right side of face affected   Periprosthetic supracondylar fracture of femur, left  03/23/2017   Pneumonia    Pre-diabetes    Restless leg syndrome    Rhinitis 06/25/2010   Seizures (HCC) ?   TIA   Sleep apnea    does not use CPAP   Spinal headache    with the C section   Stroke (HCC)    Hx TIAs x 3   last was around 2014   TIA (transient ischemic attack)    3         Ulcer    One   Vitamin D  deficiency     Past Surgical History:  Procedure Laterality Date   ABDOMINAL HYSTERECTOMY  1992   ANTERIOR CERVICAL DECOMPRESSION/DISCECTOMY FUSION 4 LEVELS N/A 06/14/2015   Procedure: Cervical three-four Cervical four-five Cervical five- six Cervical six- seven  Anterior cervical decompression/diskectomy/fusion;  Surgeon: Fairy Levels, MD;  Location: [facility];  Service: Neurosurgery;  Laterality: N/A;  C3-4 C4-5 C5-6 C6-7 Anterior cervical decompression/diskectomy/fusion   APPENDECTOMY  1960   BACK SURGERY     surgeries x 3   CATARACT EXTRACTION, BILATERAL     CESAREAN SECTION  1983 & 1886   DRUG INDUCED ENDOSCOPY N/A 02/25/2022   Procedure: DRUG INDUCED SLEEP ENDOSCOPY;  Surgeon: Mable Lenis, MD;  Location: [facility];  Service: ENT;  Laterality: N/A;   EYE SURGERY     bilateral cataracts   FINGER ARTHROSCOPY     FRACTURE SURGERY  2020   Femur   IMPLANTATION OF HYPOGLOSSAL NERVE STIMULATOR Right 05/29/2022   Procedure: IMPLANTATION OF HYPOGLOSSAL NERVE STIMULATOR;  Surgeon: Mable Lenis, MD;  Location: [facility] OR;  Service: ENT;  Laterality: Right;   JOINT REPLACEMENT     2   LEFT  1 RIGHT    KNEE SURGERY     ORIF  FEMUR FRACTURE Left 03/24/2017   Procedure: OPEN REDUCTION INTERNAL FIXATION (ORIF) FEMUR FRACTURE;  Surgeon: Celena Sharper, MD;  Location: [facility] OR;  Service: Orthopedics;  Laterality: Left;   REVERSE SHOULDER ARTHROPLASTY Left 10/21/2023   Procedure: ARTHROPLASTY, SHOULDER, TOTAL, REVERSE;  Surgeon: Cristy Bonner DASEN, MD;  Location: [facility];  Service: Orthopedics;  Laterality: Left;   ROTATOR CUFF REPAIR Left 2005   2 LEFT  1  RIGHT   SHOULDER ADHESION RELEASE     SPINAL CORD DECOMPRESSION  1998   SPINE SURGERY     TOE SURGERY     LEFT        TOE SURGERY     RIGHT  BIG TOE   TONSILLECTOMY  1960   TRANSFORAMINAL LUMBAR INTERBODY FUSION W/ MIS 1 LEVEL Left 09/05/2021   Procedure: MINIMALLY INVASIVE TRANSFORAMINAL LUMBAR INTERBODY FUSION  LUMBAR THREE-FOUR; EXPLORE FUSION WITH REVISION OF HARDWARE LUMBAR FOUR-FIVE,LEFT SIDE APPROACH;  Surgeon: Dawley, Lani BROCKS, DO;  Location: [facility] OR;  Service: Neurosurgery;  Laterality: Left;    Family History  Problem Relation Age of Onset   Cancer Mother    Diabetes Mother    Osteoporosis Mother    Breast cancer Mother    Arthritis Mother    Miscarriages / [place] Mother    Obesity Mother    Heart disease Father    Hypertension Father    Hyperlipidemia Father    Neuropathy Father    Stroke Father    Leukemia Sister    Lupus Sister    Early death Sister    Miscarriages / [place] Sister    Depression Sister    Rheum arthritis Sister    Cancer Sister    Miscarriages / [place] Sister    Migraines Daughter    Cancer Daughter    Anxiety disorder Daughter    Cancer Son    Early death Son    Liver disease Neg Hx    Colon cancer Neg Hx    Esophageal cancer Neg Hx     Social History   Socioeconomic History   Marital status: Married    Spouse name: Not on file   Number of children: 2   Years of education: Not on file   Highest education level: Bachelor's degree (e.g., BA, AB, BS)  Occupational History   Occupation: Retired Database administrator: DISABLED  Tobacco Use   Smoking status: Never    Passive exposure: Past   Smokeless tobacco: Never  Vaping Use   Vaping status: Never Used  Substance and Sexual Activity   Alcohol use: Not Currently    Alcohol/week: 4.0 standard drinks of alcohol    Types: 2 Cans of beer, 2 Standard drinks or equivalent per week    Comment: On and off   Drug use: No   Sexual activity: Not Currently    Birth control/protection: None    Comment: Hysterectomy  Other Topics Concern   Not on file  Social History Narrative   Drinks about 1 cup of coffee a day, occasional coke zero    Social Drivers of Corporate investment banker Strain: Low Risk  (08/23/2023)   Overall Financial Resource Strain (CARDIA)    Difficulty of Paying Living Expenses: Not hard at all  Food Insecurity: No Food Insecurity (08/23/2023)   Hunger Vital Sign    Worried About Running Out of Food in the Last Year: Never true    Ran Out of Food in the Last Year: Never true  Transportation Needs: No Transportation Needs (08/23/2023)   PRAPARE - Transportation    Lack of Transportation (Medical): No    Lack of Transportation (Non-Medical): No  Physical Activity: Unknown (08/23/2023)   Exercise Vital Sign    Days of Exercise per Week: Patient declined    Minutes of Exercise per Session: Not on file  Stress: Stress Concern Present (08/23/2023)   [employer] of Occupational Health - Occupational Stress Questionnaire    Feeling of Stress : To some extent  Social Connections: Unknown (08/23/2023)   Social Connection and Isolation Panel    Frequency of Communication with Friends and Family: More than three times a week    Frequency of Social Gatherings with Friends and Family: Once a week    Attends Religious Services: Patient declined    Database administrator or Organizations: Yes  Attends Banker Meetings: More than 4 times per year    Marital Status: Married  Catering manager Violence: Not  on file    Outpatient Medications Prior to Visit  Medication Sig Dispense Refill   acyclovir  (ZOVIRAX ) 400 MG tablet TAKE 1 TABLET BY MOUTH EVERY DAY 90 tablet 1   bisoprolol -hydrochlorothiazide  (ZIAC ) 10-6.25 MG tablet Take 1 tablet by mouth daily. 90 tablet 2   Black Pepper-Turmeric (TURMERIC PLUS BLACK PEPPER EXT PO) Take 1 tablet by mouth daily.     buPROPion  (WELLBUTRIN  XL) 300 MG 24 hr tablet Take 1 tablet (300 mg total) by mouth daily for mood, focus & concentration. 90 tablet 0   carboxymethylcellulose (REFRESH PLUS) 0.5 % SOLN Place 1 drop into both eyes 3 (three) times daily as needed (dry eyes).     clobetasol ointment (TEMOVATE) 0.05 % Apply 1 Application topically 3 (three) times daily as needed (lichen planus).     clotrimazole  (MYCELEX ) 10 MG troche Dissolve 1 tablet (10 mg total) in mouth 3 (three) times daily. 140 Troche 11   dexamethasone  (DECADRON ) 0.5 MG/5ML solution Take 5 mLs (0.5 mg total) by mouth 3 (three) times daily. (Patient taking differently: Take 0.5 mg by mouth 2 (two) times daily.) 100 mL 1   diclofenac  Sodium (VOLTAREN ) 1 % GEL Apply 2 grams topically daily as needed (pain). 100 g 1   DULoxetine  (CYMBALTA ) 60 MG capsule Take 1 capsule (60 mg total) by mouth daily for chronic pain. 90 capsule 3   fenofibrate  micronized (LOFIBRA) 134 MG capsule Take 1 capsule (134 mg total) by mouth daily before breakfast 90 capsule 3   Ferrous Sulfate (IRON PO) Take 25 mg by mouth daily.     fluticasone  (FLONASE ) 50 MCG/ACT nasal spray Use  1-2 Sprays  each nostril   1-2 x /day  as needed 16 g 5   gabapentin  (NEURONTIN ) 300 MG capsule Take  1 capsule  3 x /day  or Chronic Pain    (Dx: m79.7)                                               /                                TAKE                                   BY                                  MOUTH     gabapentin  (NEURONTIN ) 600 MG tablet Take 1 tablet (600 mg total) by mouth at bedtime. 30 tablet 5   gabapentin  (NEURONTIN ) 800 MG  tablet Take 1 tablet (800 mg total) by mouth at bedtime. 30 tablet 3   Glycerin, Laxative, (FLEET LIQUID GLYCERIN SUPP RE) Place 1 Dose rectally daily as needed (Constipation).     levothyroxine  (SYNTHROID ) 88 MCG tablet Take 1 tablet (88 mcg total) by mouth daily on an empty stomach with water  for 30 minutes.  No antacids, calcium , magnesium  for 4 hours. Avoid biotin. 90 tablet 3   meclizine  (ANTIVERT ) 25 MG tablet TAKE  1 TABLET (25 MG TOTAL) BY MOUTH 3 (THREE) TIMES DAILY AS NEEDED FOR DIZZINESS. 90 tablet 0   mupirocin ointment (BACTROBAN) 2 % Apply 1 Application topically 3 (three) times daily as needed (lichen planus).     neomycin -polymyxin-hydrocortisone (CORTISPORIN) OTIC solution Place 3 drops into both ears daily as needed (ear inflammation).     omeprazole  (PRILOSEC) 40 MG capsule Take 1 capsule (40 mg total) by mouth daily. 90 capsule 3   OVER THE COUNTER MEDICATION Place 1 drop under the tongue daily. D3 5000 units - K2 120 mcg     oxybutynin  (DITROPAN ) 5 MG tablet Take 1 tablet 2 x /day fr Excessive Sweating (Patient taking differently: daily. Take 1 tablet 2 x /day fr Excessive Sweating) 180 tablet 1   Plecanatide  (TRULANCE ) 3 MG TABS Take 1 tablet (3 mg total) by mouth daily. Lot: [id number], exp: 09-2024 3 tablet 0   Risankizumab -rzaa ([name] ) 360 MG/2.4ML SOCT Inject 1 Cartridge into the skin every 8 (eight) weeks. 2.4 mL 5   rOPINIRole  (REQUIP ) 2 MG tablet Take 1 tablet (2 mg total) by mouth 2 (two) times daily AND 2 tablets (4 mg total) at bedtime for restless legs 360 tablet 3   tizanidine  (ZANAFLEX ) 2 MG capsule Take 1 capsule (2 mg total) by mouth at bedtime for muscle spasm. 90 capsule 3   traMADol  (ULTRAM ) 50 MG tablet Take 1-2 tablets (50-100 mg total) by mouth every 6 (six) hours as needed (for SEVERE pain ONLY.). NO REFILLS 30 tablet 0   Vitamin D -Vitamin K (K2-D3 5000 PO) Take 5,000 Units by mouth daily.     polyethylene glycol (MIRALAX  / GLYCOLAX ) 17 g packet Take 17-51 g by  mouth 3 (three) times daily. Take 3 doses daily (Patient not taking: Reported on 02/03/2024)     promethazine  (PHENERGAN ) 25 MG tablet Take 1 tablet (25 mg total) by mouth every 6 (six) hours as needed for nausea or vomiting. (Patient not taking: Reported on 02/03/2024) 10 tablet 0   Rectal Protectant-Emollient (CALMOL-4) 76-10 % SUPP Use as directed once to twice daily (Patient not taking: Reported on 02/03/2024)     fluconazole  (DIFLUCAN ) 200 MG tablet Take 1 tablet by mouth daily.     No facility-administered medications prior to visit.    Allergies  Allergen Reactions   Methocarbamol  Shortness Of Breath, Nausea Only and Other (See Comments)    Other reaction(s): Dizziness, psychological reaction, disoriented Sleepy and weight loss    Codeine Other (See Comments)    Headache   Statins Other (See Comments)    Extreme pain   Azathioprine    Other Other (See Comments)    MINT=sore mouth and stomach upset   Oxycodone      Dizziness, unbalanced.   Amitriptyline Other (See Comments)    Confusion and hallucinations   Erythromycin Base Rash    Flushing/red face.   Fish Oil Nausea Only   Flax Seed [Flax Seed Oil] Rash   Flaxseed (Linseed) Rash   Hydrocodone  Itching and Rash        Morphine Nausea And Vomiting and Rash   Nsaids Nausea Only   Nuvigil [Armodafinil] Other (See Comments)    Unspecified   Sertraline Rash   Sinemet [Carbidopa-Levodopa] Rash   Sulfa Antibiotics Rash   Tolmetin Nausea Only    Review of Systems  Constitutional:  Negative for chills, fever and weight loss.  HENT:  Negative for congestion, ear pain, hearing loss, sore throat and tinnitus.   Eyes:  Negative for  blurred vision and double vision.  Respiratory:  Negative for shortness of breath.   Cardiovascular:  Negative for chest pain, palpitations and leg swelling.  Gastrointestinal:  Negative for abdominal pain, constipation, diarrhea, nausea and vomiting.  Genitourinary:  Negative for dysuria, frequency  and urgency.  Musculoskeletal:  Positive for back pain and joint pain.  Neurological:  Positive for dizziness and weakness. Negative for tingling, focal weakness, loss of consciousness and headaches.  Psychiatric/Behavioral:  Negative for depression.        Objective:    Physical Exam Constitutional:      General: She is not in acute distress.    Appearance: She is not ill-appearing.  HENT:     Right Ear: Tympanic membrane and ear canal normal.     Left Ear: Tympanic membrane and ear canal normal.     Mouth/Throat:     Mouth: Mucous membranes are moist.     Pharynx: Oropharynx is clear.  Eyes:     Extraocular Movements: Extraocular movements intact.     Conjunctiva/sclera: Conjunctivae normal.  Cardiovascular:     Rate and Rhythm: Normal rate and regular rhythm.  Pulmonary:     Effort: Pulmonary effort is normal.     Breath sounds: Normal breath sounds.  Musculoskeletal:     Cervical back: Normal range of motion and neck supple.     Right lower leg: No edema.     Left lower leg: No edema.  Skin:    General: Skin is warm and dry.  Neurological:     General: No focal deficit present.     Mental Status: She is alert and oriented to person, place, and time.     Motor: No weakness.     Coordination: Coordination normal.     Gait: Gait normal.  Psychiatric:        Mood and Affect: Mood normal.        Behavior: Behavior normal.        Thought Content: Thought content normal.      BP 120/72   Pulse 78   Temp 97.6 F (36.4 C) (Temporal)   Ht 5' 3 (1.6 m)   Wt 152 lb (68.9 kg)   SpO2 97%   BMI 26.93 kg/m  Wt Readings from Last 3 Encounters:  02/03/24 152 lb (68.9 kg)  12/09/23 153 lb 6 oz (69.6 kg)  11/26/23 155 lb 8 oz (70.5 kg)       Assessment & Plan:   Problem List Items Addressed This Visit     Dizziness   Hyperlipidemia associated with type 2 diabetes mellitus (HCC)   Relevant Orders   AMB Referral [facility] Care Management   Type 2 diabetes mellitus  with chronic kidney disease, without long-term current use of insulin  (HCC)   Relevant Orders   AMB Referral [facility] Care Management   Other Visit Diagnoses       Vertigo    -  Primary     Hot flashes         Chronic interstitial cystitis         Sweating increase         History of motion sickness         Balance disorder          Assessment and Plan Assessment & Plan Dizziness and vertigo with balance problems and increased risk of falls Dizziness and vertigo have worsened, with episodes occurring when lying down, turning, and standing. There is an increased risk of falls  due to balance issues and generalized weakness. Possible dehydration may be contributing to symptoms. No new symptoms of chest pain or stroke-like symptoms. - Continue physical therapy for balance problems. - Ensure adequate hydration. - Administer Meclizine  25 mg orally every 12 hours for 2-3 days to assess effectiveness. - Refer back to [facility] for further evaluation and management. Reviewed notes from [facility] neurologist with patient from 2023.   Excessive sweating and hot flashes Excessive sweating and hot flashes are likely related to menopause. Symptoms increased when Oxybutynin  dosage was reduced, but were previously well-managed with twice-daily dosing. - Resume Oxybutynin  5 mg orally twice daily, morning and evening.  Overactive bladder and interstitial cystitis Overactive bladder and interstitial cystitis symptoms were controlled with Oxybutynin  taken twice daily. Symptom recurrence occurred with dosage reduction. - Continue Oxybutynin  5 mg orally twice daily.  Chronic pain due to osteoarthritis and degenerative spine disease - continue seeing rheumatology for further management of osteoarthritis.  Peripheral neuropathy Peripheral neuropathy with nerve pain in lower spine, feet, and hands. Gabapentin  adjusted to 600 mg for better sleep management. - Continue Gabapentin  600 mg orally as  prescribed.  Diet-controlled type 2 diabetes mellitus with stage 2 chronic kidney disease and mixed hyperlipidemia Cholesterol levels remain high despite dietary changes. She is unable to take statins and is currently on fenofibrate . There was a discussion about potential use of Repatha for better cholesterol control. She expressed a desire to manage cholesterol through diet and exercise but acknowledged current levels may require medication. - Contact pharmacist for medication management assistance. - Discuss potential initiation of Repatha for cholesterol management.  Visit time 33 minutes in face to face communication with patient and coordination of care, additional 15 minutes spent in record review, coordination or care, ordering tests, communicating/referring to other healthcare professionals, documenting in medical records all on the same day of the visit for total time 48 minutes spent on the visit.        I have discontinued Crystal EUSTACE [name] [name]'s fluconazole . I am also having her maintain her meclizine , carboxymethylcellulose, Ferrous Sulfate (IRON PO), polyethylene glycol, clobetasol ointment, mupirocin ointment, OVER THE COUNTER MEDICATION, neomycin -polymyxin-hydrocortisone, Black Pepper-Turmeric (TURMERIC PLUS BLACK PEPPER EXT PO), diclofenac  Sodium, gabapentin , fluticasone , Vitamin D -Vitamin K (K2-D3 5000 PO), Calmol-4, omeprazole , DULoxetine , clotrimazole , levothyroxine , rOPINIRole , dexamethasone , [name] , (Glycerin, Laxative, (FLEET LIQUID GLYCERIN SUPP RE)), promethazine , oxybutynin , fenofibrate  micronized, bisoprolol -hydrochlorothiazide , tizanidine , traMADol , acyclovir , Trulance , buPROPion , gabapentin , and gabapentin .  No orders of the defined types were placed in this encounter.

## 2024-02-03 NOTE — Patient Instructions (Addendum)
 Increase your water  intake so that you are well-hydrated.    Take meclizine  every 12 hours for the next 2 to 3 days  Change positions slowly.  I recommend physical therapy for the Epley maneuvers  Call and schedule an appointment to see your neurologist at [facility] since you had such an extensive workup there.  I would like to see you back in approximately 6 weeks  My pharmacist, Bari, will reach out to you regarding better control of your cholesterol and assistance with your medications.

## 2024-02-04 ENCOUNTER — Other Ambulatory Visit (HOSPITAL_BASED_OUTPATIENT_CLINIC_OR_DEPARTMENT_OTHER): Payer: Self-pay

## 2024-02-04 ENCOUNTER — Telehealth: Payer: Self-pay | Admitting: *Deleted

## 2024-02-04 DIAGNOSIS — L438 Other lichen planus: Secondary | ICD-10-CM | POA: Diagnosis not present

## 2024-02-04 DIAGNOSIS — B370 Candidal stomatitis: Secondary | ICD-10-CM | POA: Diagnosis not present

## 2024-02-04 MED ORDER — FLUCONAZOLE 200 MG PO TABS
200.0000 mg | ORAL_TABLET | ORAL | 0 refills | Status: DC
  Filled 2024-02-04: qty 14, 98d supply, fill #0
  Filled 2024-02-19: qty 14, 98d supply, fill #1

## 2024-02-04 NOTE — Progress Notes (Signed)
 Care Guide Pharmacy Note  02/04/2024 Name: Crystal [name] MRN: [id number] DOB: July 09, 1950  Referred By: Lendia Boby CROME, NP-C Reason for referral: Call Attempt #1 and Complex Care Management (Outreach to schedule referral with pharmacist )   Crystal [name] is a 73 y.o. year old female who is a primary care patient of Lendia, Vickie L, NP-C.  Crystal [name] was referred to the pharmacist for assistance related to: HLD  An unsuccessful telephone outreach was attempted today to contact the patient who was referred to the pharmacy team for assistance with medication management. Additional attempts will be made to contact the patient.  Thedford Franks, CMA, Care Guide [facility]  [facility] Guide Direct Dial: [phone]  Fax: [phone] Website: [email or web address]

## 2024-02-05 ENCOUNTER — Other Ambulatory Visit: Payer: Self-pay

## 2024-02-05 ENCOUNTER — Ambulatory Visit (INDEPENDENT_AMBULATORY_CARE_PROVIDER_SITE_OTHER): Admitting: Family Medicine

## 2024-02-05 ENCOUNTER — Ambulatory Visit: Payer: Self-pay

## 2024-02-05 ENCOUNTER — Encounter: Payer: Self-pay | Admitting: Family Medicine

## 2024-02-05 VITALS — BP 128/72 | HR 73 | Temp 97.8°F | Ht 63.0 in | Wt 152.0 lb

## 2024-02-05 DIAGNOSIS — R300 Dysuria: Secondary | ICD-10-CM | POA: Diagnosis not present

## 2024-02-05 LAB — POCT URINALYSIS DIP (CLINITEK)
Bilirubin, UA: NEGATIVE
Blood, UA: NEGATIVE
Glucose, UA: NEGATIVE mg/dL
Ketones, POC UA: NEGATIVE mg/dL
Leukocytes, UA: NEGATIVE
Nitrite, UA: NEGATIVE
POC PROTEIN,UA: NEGATIVE
Spec Grav, UA: 1.015 (ref 1.010–1.025)
Urobilinogen, UA: 0.2 U/dL
pH, UA: 6 (ref 5.0–8.0)

## 2024-02-05 NOTE — Telephone Encounter (Signed)
 want something for dysuria and urinary frequency and back pain (mild but not severe, not doubling over in pain from it, try to move 5-6/10, not tender to the touch)

## 2024-02-05 NOTE — Patient Instructions (Addendum)
 Marshmallow root tea or capsule to help ease bladder pain  D Mannose supplementation to help ease bladder pain as well  We are sending your urine for culture today, it looks pretty clean right now. We will be in contact with results that require further attention.  Follow-up with me for new or worsening symptoms.

## 2024-02-05 NOTE — Progress Notes (Signed)
 Care Guide Pharmacy Note  02/05/2024 Name: Crystal [name] MRN: [id number] DOB: Feb 09, 1951  Referred By: Lendia Boby CROME, NP-C Reason for referral: Call Attempt #1 and Complex Care Management (Outreach to schedule referral with pharmacist )   Crystal [name] is a 73 y.o. year old female who is a primary care patient of Lendia, Vickie L, NP-C.  Crystal [name] was referred to the pharmacist for assistance related to: HLD  A second unsuccessful telephone outreach was attempted today to contact the patient who was referred to the pharmacy team for assistance with medication management. Additional attempts will be made to contact the patient.  Thedford Franks, CMA [place]  [facility], [facility] Guide Direct Dial: [phone]  Fax: [phone] Website: [email or web address]

## 2024-02-05 NOTE — Telephone Encounter (Signed)
 Spoke w Vickie, she states pt needs an appt. Called pt and advised her vickie would like for her to be seen today so she does not worsen over the weekend. Pt states she can come in this afternoon, booked appt w Corean Ku

## 2024-02-05 NOTE — Progress Notes (Signed)
 Acute Office Visit  Subjective:     Patient ID: Crystal [name], female    DOB: 22-May-1951, 73 y.o.   MRN: [id number]  Chief Complaint  Patient presents with   Acute Visit    HPI  Discussed the use of AI scribe software for clinical note transcription with the patient, who gave verbal consent to proceed.  History of Present Illness Crystal [name] Pat is a 73 year old female with interstitial cystitis and osteoarthritis who presents with urinary symptoms and bladder discomfort.  Urinary symptoms and bladder discomfort - Urinary frequency and bladder discomfort attributed to interstitial cystitis - Overactive bladder managed with oxybutynin , recently increased dose due to worsening symptoms - Frequent urge to urinate and concern for reduced bladder capacity and scar tissue - Uses supplements and oils, including marshmallow root and D-mannose - No recent urinary tract infections; history of childhood UTIs often triggered by bubble baths - No fever; baseline temperature is 97.58F - Rarely experiences vaginal discharge - Fluconazole  reduced to once weekly  Renal concerns and prior altered mental status - Concern for kidney function due to prior medication mismanagement - History of altered consciousness from December 14th to January 26th, associated with hematuria - Treated with intravenous saline and medication adjustments during that period  Lower back pain and functional limitations - Lower back pain, uncertain if related to bladder issues - History of lower spine surgery - Difficulty sitting for prolonged periods  Gastrointestinal symptoms - Crohn's disease with intermittent bloating followed by diarrhea - Recent change from constipation to diarrhea after [name]  dose increased to 360 mg  Generalized weakness, fatigue, and sleep disturbance - Feels worn out and weak - Disrupted sleep, waking at 3 AM - Gabapentin  increased to 600 mg at night  for sleep  Osteoarthritis - Osteoarthritis contributing to overall discomfort and functional limitations     ROS Per HPI      Objective:    BP 128/72 (BP Location: Left Arm, Patient Position: Sitting)   Pulse 73   Temp 97.8 F (36.6 C) (Temporal)   Ht 5' 3 (1.6 m)   Wt 152 lb (68.9 kg)   SpO2 93%   BMI 26.93 kg/m    Physical Exam Vitals and nursing note reviewed.  Constitutional:      General: She is not in acute distress.    Appearance: She is obese.  HENT:     Head: Normocephalic and atraumatic.     Right Ear: External ear normal.     Left Ear: External ear normal.     Nose: Nose normal.     Mouth/Throat:     Mouth: Mucous membranes are moist.     Pharynx: Oropharynx is clear.  Eyes:     Extraocular Movements: Extraocular movements intact.     Pupils: Pupils are equal, round, and reactive to light.  Cardiovascular:     Rate and Rhythm: Normal rate and regular rhythm.     Pulses: Normal pulses.     Heart sounds: Normal heart sounds.  Pulmonary:     Effort: Pulmonary effort is normal. No respiratory distress.     Breath sounds: Normal breath sounds. No wheezing, rhonchi or rales.  Musculoskeletal:        General: Normal range of motion.     Cervical back: Normal range of motion.     Right lower leg: No edema.     Left lower leg: No edema.  Lymphadenopathy:     Cervical: No cervical adenopathy.  Neurological:     General: No focal deficit present.     Mental Status: She is alert and oriented to person, place, and time.  Psychiatric:        Mood and Affect: Mood normal.        Thought Content: Thought content normal.     Results for orders placed or performed in visit on 02/05/24  POCT URINALYSIS DIP (CLINITEK)  Result Value Ref Range   Color, UA yellow yellow   Clarity, UA clear clear   Glucose, UA negative negative mg/dL   Bilirubin, UA negative negative   Ketones, POC UA negative negative mg/dL   Spec Grav, UA 8.984 8.989 - 1.025   Blood, UA  negative negative   pH, UA 6.0 5.0 - 8.0   POC PROTEIN,UA negative negative, trace   Urobilinogen, UA 0.2 0.2 or 1.0 E.U./dL   Nitrite, UA Negative Negative   Leukocytes, UA Negative Negative        Assessment & Plan:   Assessment and Plan Assessment & Plan Interstitial cystitis and overactive bladder Chronic interstitial cystitis with overactive bladder symptoms. Pelvic floor dysfunction considered as a contributing factor. - Increase oxybutynin  to twice daily. Discussed potential dryness as a side effect. - Discuss aloe vera and marshmallow root as alternative therapies. - Consider referral for pelvic floor physical therapy. - Send urine for culture to rule out infection.  Recurrent urinary tract infection Recurrent UTIs with previous E. coli infection. Current symptoms include increased urinary frequency and urgency. Urine culture pending. - Send urine for culture to rule out infection.  Chronic back pain due to lumbar spine compression and osteoarthritis Chronic back pain from lumbar spine compression and osteoarthritis, possibly contributing to bladder irritation. Discussed barometric pressure effects.  Chronic kidney disease, unspecified stage Chronic kidney disease, unspecified stage. Previous concerns about kidney function due to medication mismanagement. No acute kidney symptoms. - Defer kidney function testing until next scheduled blood work in five weeks.     Orders Placed This Encounter  Procedures   Urine Culture   POCT URINALYSIS DIP (CLINITEK)     No orders of the defined types were placed in this encounter.   Return if symptoms worsen or fail to improve.  Crystal LITTIE Ku, FNP

## 2024-02-05 NOTE — Telephone Encounter (Signed)
 FYI Only or Action Required?: Action required by provider: Advised pt be examined in next 4 hours, pt refusing disposition, requesting relief for back pain and urinary symptoms, needs call back today.  Patient was last seen in primary care on 02/03/2024 by Lendia Boby CROME, NP-C.  Called Nurse Triage reporting Dysuria, Back Pain, worsening weakness for past few days, worsening diarrhea with Crohn's, interrupted sleep, Urinary Frequency, Urinary Urgency, bowel frequency, Knee Pain, and chronic sweats and clamminess.  Symptoms began several days ago.  Interventions attempted: Prescription medications: oxybutynin  just increased to 2x/day today, Rest, hydration, or home remedies, Dietary changes, and Other: topical oil for knee pain.  Symptoms are: sudden onset/worsening.  Triage Disposition: See HCP Within 4 Hours (Or PCP Triage) - Advised ED if worsening weakness or severe pain  Patient/caregiver understands and will follow disposition?: No, refuses disposition      Copied from CRM [id number]. Topic: Clinical - Red Word Triage >> Feb 05, 2024  9:41 AM Jasmin G wrote: Red Word that prompted transfer to Nurse Triage: UTI accompanied by burning and pain. Reason for Disposition  [1] MODERATE weakness (e.g., interferes with work, school, normal activities) AND [2] cause unknown  (Exceptions: Weakness from acute minor illness or poor fluid intake; weakness is chronic and not worse.)  Answer Assessment - Initial Assessment Questions 1. SEVERITY: How bad is the pain?  (e.g., Scale 1-10; mild, moderate, or severe)     Just started so, little bit more than mild but real weak now starting yesterday, today really has hit me, no chest pain or SOB more than usual, just lower back and just above the waist too is aching me 4. ONSET: When did the painful urination start?      Back started bothering me 3 days ago, then started itching, no going little bit at a time and have to get up and go, not urinating a  lot right now because constantly needing to go to bathroom Taking fluconazole  Saw derm, reducing it to once a week, for candida 5. FEVER: Do you have a fever? If Yes, ask: What is your temperature, how was it measured, and when did it start?     No fever 8. OTHER SYMPTOMS: Do you have any other symptoms? (e.g., blood in urine, flank pain, genital sores, urgency, vaginal discharge)     No abdominal pain, do have crohn's and having some problems with that, lot of diarrhea and see GI for that, saw him then talked on the phone but going to have to call back about all this diarrhea, varies day to day but quite a lot, every time I eat I have some then in between too, varies in amount each time No problem eating and drinking, thirsty, just had a gatorade Advised pt water  down her gatorade to half-strength to not worsen diarrhea further Balance problems, going to go to neurologist at [facility], vertigo, worsening lately, so setting up appt to go again Smaller bladder than normal and interstitial cystitis, in morning go enough then other times Woke up in the night for it too When feel burning urination feels a little raw No changes to vaginal discharge Hx full hysterectomy Hx of neuropathy and weakness but weakness been worsening, stumbling, knees giving out, since less than a week ago, denies worsening of neuropathy Noticed the past few nights knee aches when go to bed and put some frankincense/aches/pain oil to help soothe it, does help, got lidocaine  but hate to use that Talked to PCP about going  through sweats and clammy, taking oxybutynin  twice a day now, just started today Saw PCP few days ago Hearing stomach make lot of noises but again my concern is the fact that I'm peeing a lot, after have lot of liquid full stream, then not a lot after that but urgency, frequency Don't think mentioned to PCP that feeling weaker but weaker for past few days and back pain past couple days More than mild but not  severe, not doubling over in pain from it, try to move 5-6/10, not tender to the touch Confirms drinking lots of water  or trying to, getting lots of urine out then little bits in between since having urgency/frequency  Advised pt be examined in next 4 hours  Just want something for bladder and back, saw PCP other day then was at hospital yesterday and other appts, tires me to go to more appts Back pain is one of major things that is bothering me, then don't usually have UTIs, dizziness will be addressed, feel like something going on   Advised pt be examined in next 4 hours, pt refusing disposition, requesting relief for back pain and urinary symptoms. Advised go to ED if worsening weakness or severe pain. Please call pt back today.  Protocols used: Urination Pain - Female-A-AH, Weakness (Generalized) and Fatigue-A-AH

## 2024-02-06 LAB — URINE CULTURE: Result:: NO GROWTH

## 2024-02-08 ENCOUNTER — Telehealth: Payer: Self-pay

## 2024-02-08 ENCOUNTER — Ambulatory Visit: Payer: Self-pay | Admitting: Family Medicine

## 2024-02-08 DIAGNOSIS — M25512 Pain in left shoulder: Secondary | ICD-10-CM | POA: Diagnosis not present

## 2024-02-08 DIAGNOSIS — R42 Dizziness and giddiness: Secondary | ICD-10-CM

## 2024-02-08 NOTE — Telephone Encounter (Signed)
 Copied from CRM #[id number]. Topic: Referral - Request for Referral >> Feb 08, 2024 11:45 AM Emylou G wrote: Did the patient discuss referral with their provider in the last year? Yes (If No - schedule appointment) (If Yes - send message)  Appointment offered? Yes Monday  Type of order/referral and detailed reason for visit: needs PT for dizziness  Preference of office, provider, location: [facility]   If referral order, have you been seen by this specialty before? No (If Yes, this issue or another issue? When? Where?  Can we respond through MyChart? Yes

## 2024-02-09 ENCOUNTER — Encounter: Payer: Self-pay | Admitting: Gastroenterology

## 2024-02-09 NOTE — Progress Notes (Signed)
 Complex Care Management Note Care Guide Note  02/09/2024 Name: Crystal [name] MRN: [id number] DOB: 12/19/1950   Complex Care Management Outreach Attempts: A third unsuccessful outreach was attempted today to offer the patient with information about available complex care management services.  Follow Up Plan:  No further outreach attempts will be made at this time. We have been unable to contact the patient to offer or enroll patient in complex care management services.  Encounter Outcome:  No Answer  Thedford Franks, CMA [place]  [facility], [facility] Guide Direct Dial: [phone]  Fax: [phone] Website: [email or web address]

## 2024-02-09 NOTE — Telephone Encounter (Signed)
 Pt wants PT for vertigo

## 2024-02-09 NOTE — Addendum Note (Signed)
 Addended by: Lionell Matuszak E on: 02/09/2024 03:07 PM   Modules accepted: Orders

## 2024-02-09 NOTE — Telephone Encounter (Signed)
 Referral placed.

## 2024-02-10 DIAGNOSIS — R42 Dizziness and giddiness: Secondary | ICD-10-CM | POA: Diagnosis not present

## 2024-02-10 NOTE — Progress Notes (Signed)
 Called and spoke with pt. Informed of cxray results per [name] Ferrari. Pt verbalized understanding. NFN.

## 2024-02-12 ENCOUNTER — Other Ambulatory Visit (HOSPITAL_COMMUNITY): Payer: Self-pay

## 2024-02-12 IMAGING — RF DG LUMBAR SPINE 2-3V
1 series · 2 of 2 positions shown · non-contrast
Comparison: Lumbar spine radiographs 09/26/2020

CLINICAL DATA: Minimally invasive transforaminal lumbar interbody
fusion three 4.

EXAM:
LUMBAR SPINE - 2 VIEW

[Series 1: run · 2 of 2 slices shown]
[im 1/2]
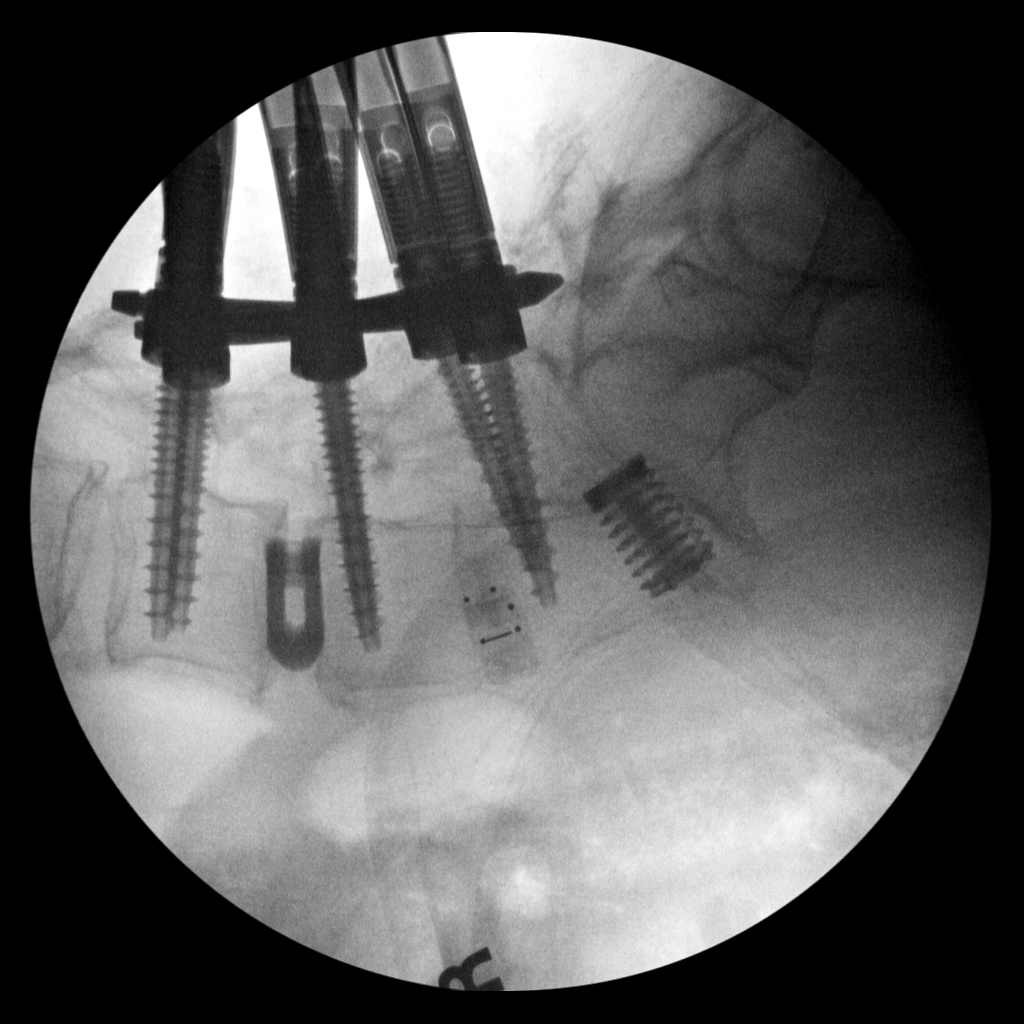
[im 2/2]
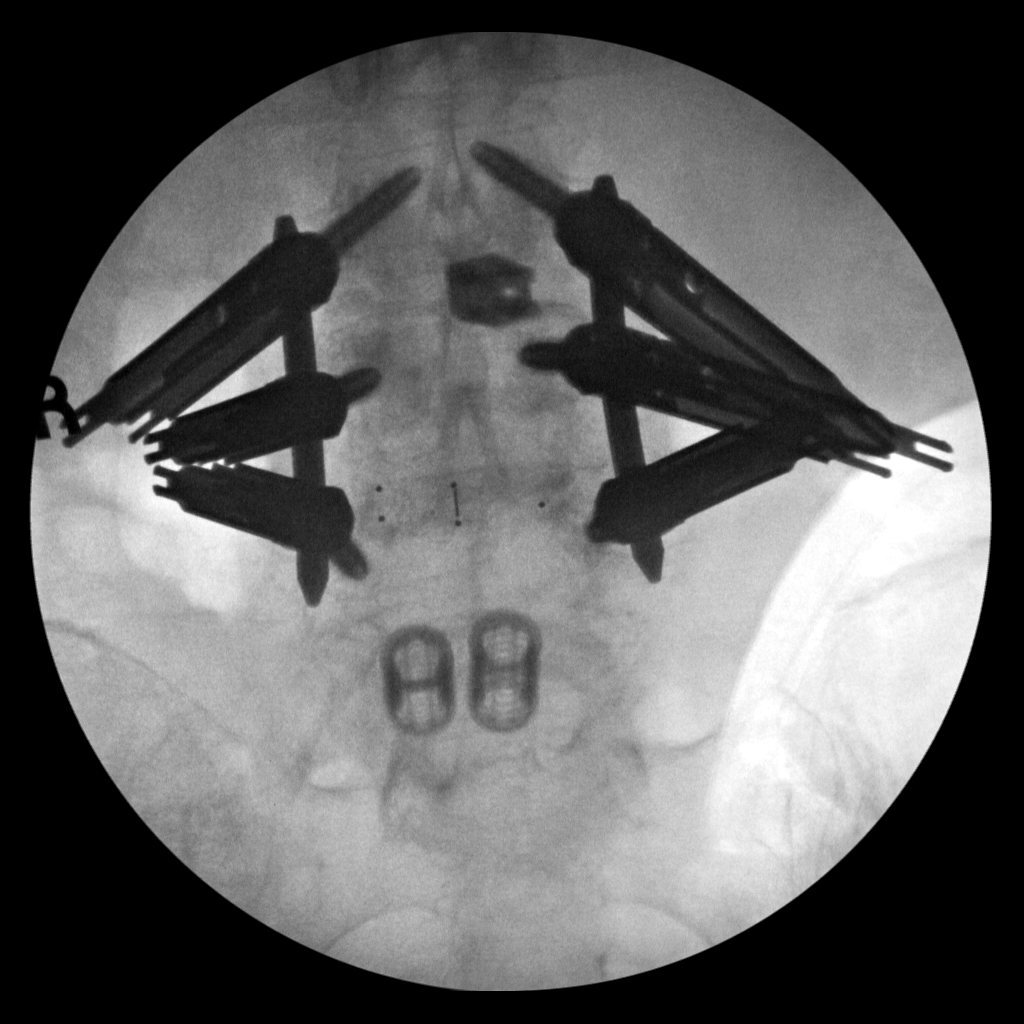

[2 of 2 positions shown; findings below may reference images not displayed]

FINDINGS: Two intraoperative images are submitted.

Previous interbody spacers at L4-5 and L5-S1 are stable. Pedicle
screws are noted at L3, L4, and L5 bilaterally attached to insertion
hardware. Rods extend across all 3 levels.
IMPRESSION: L3-L5 fusion without radiographic evidence for complication.

## 2024-02-17 DIAGNOSIS — M25512 Pain in left shoulder: Secondary | ICD-10-CM | POA: Diagnosis not present

## 2024-02-17 NOTE — Telephone Encounter (Signed)
 Patient has been scheduled for a F/U visit on 11-5 (next available).  She thinks you may want to see her sooner.  Please advise.

## 2024-02-19 ENCOUNTER — Other Ambulatory Visit: Payer: Self-pay

## 2024-02-19 ENCOUNTER — Other Ambulatory Visit (HOSPITAL_COMMUNITY): Payer: Self-pay

## 2024-02-19 ENCOUNTER — Other Ambulatory Visit: Payer: Self-pay | Admitting: Nurse Practitioner

## 2024-02-19 ENCOUNTER — Other Ambulatory Visit: Payer: Self-pay | Admitting: Family Medicine

## 2024-02-22 ENCOUNTER — Other Ambulatory Visit (HOSPITAL_COMMUNITY): Payer: Self-pay

## 2024-02-22 ENCOUNTER — Other Ambulatory Visit: Payer: Self-pay | Admitting: Family Medicine

## 2024-02-23 ENCOUNTER — Other Ambulatory Visit: Payer: Self-pay

## 2024-02-23 ENCOUNTER — Other Ambulatory Visit (HOSPITAL_COMMUNITY): Payer: Self-pay

## 2024-02-23 DIAGNOSIS — M25512 Pain in left shoulder: Secondary | ICD-10-CM | POA: Diagnosis not present

## 2024-02-23 MED ORDER — OMEPRAZOLE 40 MG PO CPDR
40.0000 mg | DELAYED_RELEASE_CAPSULE | Freq: Every day | ORAL | 1 refills | Status: AC
  Filled 2024-02-23: qty 90, 90d supply, fill #0
  Filled 2024-06-12: qty 90, 90d supply, fill #1

## 2024-02-24 ENCOUNTER — Other Ambulatory Visit: Payer: Self-pay

## 2024-02-24 ENCOUNTER — Ambulatory Visit: Payer: 59 | Admitting: Nurse Practitioner

## 2024-02-24 ENCOUNTER — Ambulatory Visit: Attending: Family Medicine | Admitting: Physical Therapy

## 2024-02-24 ENCOUNTER — Encounter: Payer: Self-pay | Admitting: Physical Therapy

## 2024-02-24 ENCOUNTER — Other Ambulatory Visit (HOSPITAL_COMMUNITY): Payer: Self-pay

## 2024-02-24 DIAGNOSIS — H8113 Benign paroxysmal vertigo, bilateral: Secondary | ICD-10-CM | POA: Insufficient documentation

## 2024-02-24 DIAGNOSIS — R2681 Unsteadiness on feet: Secondary | ICD-10-CM | POA: Diagnosis not present

## 2024-02-24 DIAGNOSIS — R42 Dizziness and giddiness: Secondary | ICD-10-CM | POA: Insufficient documentation

## 2024-02-24 NOTE — Therapy (Signed)
 OUTPATIENT PHYSICAL THERAPY VESTIBULAR EVALUATION     Patient Name: Crystal [name] MRN: [id number] DOB:24-Nov-1950, 73 y.o., female Today's Date: 02/24/2024  END OF SESSION:  PT End of Session - 02/24/24 1611     Visit Number 1    Number of Visits 9    Date for PT Re-Evaluation 03/25/24    Authorization Type HTA/[employer]    PT Start Time 1150    PT Stop Time 1232    PT Time Calculation (min) 42 min    Activity Tolerance Patient tolerated treatment well    Behavior During Therapy [facility] for tasks assessed/performed          Past Medical History:  Diagnosis Date   Allergy  1966   Anemia    Anxiety 2024   Nerve pain   Arthritis 2003   Rhuematologist   ASCVD (arteriosclerotic cardiovascular disease)    Blood transfusion without reported diagnosis    Hospital-surgery   Cataract 2000's   Surgery   Chronic kidney disease 2020's   [facility]   Closed nondisplaced subtrochanteric fracture of left femur (HCC) 09/03/2017   September 2018.  Status post surgery by Dr. Celena   Crohn's disease of ileum without complication [facility])    DDD (degenerative disc disease), lumbar    Depression 11/27/2010   Son KIA   Dyspnea    Eye infection    Fibromyalgia    Gait disorder 10/26/2012   GERD (gastroesophageal reflux disease) 2009   Gastroenterologist   GI bleed    Glaucoma 2000's   Stable   Hyperlipidemia    Hypertension    Meds   Hypothyroid    Neuromuscular disorder (HCC) 2013   Bells Palsy- right side of face affected   Periprosthetic supracondylar fracture of femur, left  03/23/2017   Pneumonia    Pre-diabetes    Restless leg syndrome    Rhinitis 06/25/2010   Seizures (HCC) ?   TIA   Sleep apnea    does not use CPAP   Spinal headache    with the C section   Stroke (HCC)    Hx TIAs x 3   last was around 2014   TIA (transient ischemic attack)    3         Ulcer    One   Vitamin D  deficiency    Past Surgical History:  Procedure Laterality Date    ABDOMINAL HYSTERECTOMY  1992   ANTERIOR CERVICAL DECOMPRESSION/DISCECTOMY FUSION 4 LEVELS N/A 06/14/2015   Procedure: Cervical three-four Cervical four-five Cervical five- six Cervical six- seven  Anterior cervical decompression/diskectomy/fusion;  Surgeon: Fairy Levels, MD;  Location: [facility];  Service: Neurosurgery;  Laterality: N/A;  C3-4 C4-5 C5-6 C6-7 Anterior cervical decompression/diskectomy/fusion   APPENDECTOMY  1960   BACK SURGERY     surgeries x 3   CATARACT EXTRACTION, BILATERAL     CESAREAN SECTION  1983 & 1886   DRUG INDUCED ENDOSCOPY N/A 02/25/2022   Procedure: DRUG INDUCED SLEEP ENDOSCOPY;  Surgeon: Mable Lenis, MD;  Location: [facility];  Service: ENT;  Laterality: N/A;   EYE SURGERY     bilateral cataracts   FINGER ARTHROSCOPY     FRACTURE SURGERY  2020   Femur   IMPLANTATION OF HYPOGLOSSAL NERVE STIMULATOR Right 05/29/2022   Procedure: IMPLANTATION OF HYPOGLOSSAL NERVE STIMULATOR;  Surgeon: Mable Lenis, MD;  Location: [facility];  Service: ENT;  Laterality: Right;   JOINT REPLACEMENT     2   LEFT  1 RIGHT    KNEE SURGERY     ORIF FEMUR FRACTURE Left 03/24/2017   Procedure: OPEN REDUCTION INTERNAL FIXATION (ORIF) FEMUR FRACTURE;  Surgeon: Celena Sharper, MD;  Location: [facility] OR;  Service: Orthopedics;  Laterality: Left;   REVERSE SHOULDER ARTHROPLASTY Left 10/21/2023   Procedure: ARTHROPLASTY, SHOULDER, TOTAL, REVERSE;  Surgeon: Cristy Bonner DASEN, MD;  Location: [facility];  Service: Orthopedics;  Laterality: Left;   ROTATOR CUFF REPAIR Left 2005   2 LEFT  1  RIGHT   SHOULDER ADHESION RELEASE     SPINAL CORD DECOMPRESSION  1998   SPINE SURGERY     TOE SURGERY     LEFT        TOE SURGERY     RIGHT  BIG TOE   TONSILLECTOMY  1960   TRANSFORAMINAL LUMBAR INTERBODY FUSION W/ MIS 1 LEVEL Left 09/05/2021   Procedure: MINIMALLY INVASIVE TRANSFORAMINAL LUMBAR INTERBODY FUSION LUMBAR THREE-FOUR; EXPLORE FUSION WITH REVISION OF HARDWARE LUMBAR FOUR-FIVE,LEFT  SIDE APPROACH;  Surgeon: Carollee Lani BROCKS, DO;  Location: [facility];  Service: Neurosurgery;  Laterality: Left;   Patient Active Problem List   Diagnosis Date Noted   CAP (community acquired pneumonia) 10/29/2023   Hyperhidrosis 10/29/2023   Type 2 diabetes mellitus with chronic kidney disease, without long-term current use of insulin  (HCC) 10/29/2023   Overactive bladder 10/29/2023   Arthritis 10/29/2023   Dizziness 02/11/2022   Lumbar spinal stenosis 09/05/2021   Aortic atherosclerosis (HCC) by Chest CT scan on 09/2017 07/05/2020   CKD stage 2 due to type 2 diabetes mellitus (HCC) 11/16/2019   COVID-19 virus infection 10/19/2019   Presence of artificial knee joint, right 09/20/2019   History of total knee arthroplasty, right 05/17/2019   Pain due to total right knee replacement (HCC) 05/17/2019   History of recurrent TIAs 11/15/2018   Rupture of popliteal cyst of right knee region (complex cyst) 11/03/2018   Diet-controlled type 2 diabetes mellitus (HCC) 07/04/2018   History of adenomatous polyp of colon 10/18/2017   Bronchiectasis without complication (HCC) 10/12/2017   Diverticulosis 10/12/2017   Internal hemorrhoids 10/12/2017   Polypharmacy 04/09/2017   Arthritis of carpometacarpal (CMC) joints of both thumbs 02/12/2017   Cervical disc disease 01/23/2017   Crohn's ileitis (HCC) 01/23/2017   S/P revision of total knee, left 12/18/2016   B12 deficiency anemia 06/16/2016   Sleep talking 03/26/2016   Dyspnea on exertion 11/08/2015   IBS (irritable colon syndrome) 08/09/2015   Spinal stenosis in cervical region 06/14/2015   Other specified arthropathy involving shoulder region 11/23/2014   Obesity (BMI 30.0-34.9) 10/31/2014   Medication management 10/25/2013   Rotator cuff arthropathy 09/15/2013   Hyperlipidemia associated with type 2 diabetes mellitus (HCC)    Hypothyroid    Depression, major, recurrent, in partial remission (HCC)    Vitamin D  deficiency    ASCVD    Gait  disorder 10/26/2012   Knee pain 09/07/2012   Rotator cuff tear 08/23/2012   Bilateral bunions 11/19/2011   Rhinitis 06/25/2010   Obstructive sleep apnea 06/21/2010   RESTLESS LEG SYNDROME 06/21/2010   Essential hypertension 06/21/2010   GERD 06/21/2010   Crohn's Enterocolitis / Regional enteritis 06/21/2010   Fibromyalgia 06/21/2010    PCP: Lendia Boby CROME, NP-C  REFERRING PROVIDER: Lendia Boby CROME, NP-C   REFERRING DIAG:  R42 (ICD-10-CM) - Dizziness  R42 (ICD-10-CM) - Vertigo    THERAPY DIAG:  Dizziness and giddiness  BPPV (benign paroxysmal positional vertigo), bilateral  Unsteadiness on feet  ONSET DATE:  02/09/2024 (MD referral)  Rationale for Evaluation and Treatment: Rehabilitation  SUBJECTIVE:   SUBJECTIVE STATEMENT: Several years ago had vertigo and fell/hit my head.  I've started falling again, starting again in May 2025.  Have queasiness in my stomach-it's bad when I turn my head to the right or look up or sitting up to the right-almost fall over. Pt accompanied by: self  PERTINENT HISTORY: concussion, hx of cervical spine surgery, hx of Bell's palsy on R side, neuropathy; reports hx of bilateral shoulder surgery, most recent 09/2023 on L shoulder; see above for full PMH  PAIN:  Are you having pain? Hx of OA-knee and shoulder surgery (undergoing PT post surgery)  PRECAUTIONS: Fall  RED FLAGS: None   WEIGHT BEARING RESTRICTIONS: No  FALLS: Has patient fallen in last 6 months? Yes. Number of falls at least 4  LIVING ENVIRONMENT: Lives with: lives with their spouse Lives in: House/apartment Stairs: steps with rails Has following equipment at home: Single point cane, Environmental consultant - 2 wheeled, and Wheelchair (manual)  PLOF: Independent and Leisure: Enjoys reading  PATIENT GOALS: to get rid of the dizziness  OBJECTIVE:  Note: Objective measures were completed at Evaluation unless otherwise noted.  DIAGNOSTIC FINDINGS: NA for this  episode  COGNITION: Overall cognitive status: Within functional limits for tasks assessed   SENSATION: Light touch: Impaired  and RLE neuropathy  POSTURE:  rounded shoulders  Cervical ROM:    Active A/PROM (deg) eval  Flexion 20  Extension 26  Right lateral flexion   Left lateral flexion   Right rotation 38  Left rotation 32  (Blank rows = not tested)   BED MOBILITY:  independent  TRANSFERS: Assistive device utilized: None  Sit to stand: Modified independence Stand to sit: Modified independence  GAIT: Gait pattern: step through pattern Distance walked: clinic distances Assistive device utilized: None Level of assistance: Modified independence Comments: not formally assessed  PATIENT SURVEYS:  DHI: THE DIZZINESS HANDICAP INVENTORY (DHI)  P1. Does looking up increase your problem? 4 = Yes  E2. Because of your problem, do you feel frustrated? 2 = Sometimes  F3. Because of your problem, do you restrict your travel for business or recreation?  0 = No  P4. Does walking down the aisle of a supermarket increase your problems?  2 = Sometimes  F5. Because of your problem, do you have difficulty getting into or out of bed?  4 = Yes  F6. Does your problem significantly restrict your participation in social activities, such as going out to dinner, going to the movies, dancing, or going to parties? 0 = No  F7. Because of your problem, do you have difficulty reading?  4 = Yes  P8. Does performing more ambitious activities such as sports, dancing, household chores (sweeping or putting dishes away) increase your problems?  2 = Sometimes  E9. Because of your problem, are you afraid to leave your home without having without having someone accompany you?  0 = No  E10. Because of your problem have you been embarrassed in front of others?  0 = No  P11. Do quick movements of your head increase your problem?  4 = Yes  F12. Because of your problem, do you avoid heights?  4 = Yes  P13.  Does turning over in bed increase your problem?  4 = Yes  F14. Because of your problem, is it difficult for you to do strenuous homework or yard work? 4 = Yes  E15. Because of your problem, are  you afraid people may think you are intoxicated? 0 = No  F16. Because of your problem, is it difficult for you to go for a walk by yourself?  4 = Yes  P17. Does walking down a sidewalk increase your problem?  Not answered  E18.Because of your problem, is it difficult for you to concentrate 2 = Sometimes  F19. Because of your problem, is it difficult for you to walk around your house in the dark? 4 = Yes  E20. Because of your problem, are you afraid to stay home alone?  2 = Sometimes  E21. Because of your problem, do you feel handicapped? 2 = Sometimes  E22. Has the problem placed stress on your relationships with members of your family or friends? 4 = Yes  E23. Because of your problem, are you depressed?  2 = Sometimes  F24. Does your problem interfere with your job or household responsibilities?  4 = Yes  P25. Does bending over increase your problem?  4 = Yes  TOTAL 62    DHI Scoring Instructions  The patient is asked to answer each question as it pertains to dizziness or unsteadiness problems, specifically  considering their condition during the last month. Questions are designed to incorporate functional (F), physical  (P), and emotional (E) impacts on disability.   Scores greater than 10 points should be referred to balance specialists for further evaluation.   16-34 Points (mild handicap)  36-52 Points (moderate handicap)  54+ Points (severe handicap)  Minimally Detectable Change: 17 points ([address] [place], 1990)  [place], G. SHAUNNA. and [place], C. W. (1990). The development of the Dizziness Handicap Inventory. Archives of Otolaryngology - Head and Neck Surgery 116(4): [id number].   VESTIBULAR ASSESSMENT:  GENERAL OBSERVATION: No acute distress   SYMPTOM BEHAVIOR:  Subjective history:  Have hx of vertigo in the past.  Dizziness and falls/unsteadiness started again in May 2025.  Have queasiness in my stomach-it's bad when I turn my head to the right or look up or sitting up to the right-almost fall over.Hx of car sickness, concussion years ago; April L shoulder surgery, hx of Bell's palsy (R), hx of Covid in 01/2023; E coli from dental implants, hx of pneumonia (just after the shoulder surgery) with antibiotics  Non-Vestibular symptoms: neck pain and nausea/vomiting  Type of dizziness: Imbalance (Disequilibrium), Spinning/Vertigo, and Unsteady with head/body turns  Frequency: daily  Duration: seconds-varies  Aggravating factors: Induced by position change: rolling to the right, supine to sit, and sit to stand and Induced by motion: looking up at the ceiling and turning head quickly  Relieving factors: closing eyes and slow movements  Progression of symptoms: worse  OCULOMOTOR EXAM:    Ocular Alignment: L eye is more protruded and elevated; hx of Bell's palsy on R side  Ocular ROM: slowed, reports queasiness  Spontaneous Nystagmus: absent  Gaze-Induced Nystagmus: absent  Smooth Pursuits: slowed  Saccades: intact  Convergence/Divergence: does not become double; R eye does not converge    VESTIBULAR - OCULAR REFLEX:   Slow VOR: Comment: slowed, but reports of little nausea horizontal   VOR Cancellation: Normal  Head-Impulse Test: difficult to assess-pt has difficulty relaxing  Dynamic Visual Acuity: NT   M-CTSIB  Condition 1: Firm Surface, EO 30 Sec, Normal Sway  Condition 2: Firm Surface, EC 6 Sec, Severe Sway  Condition 3: Foam Surface, EO NT Sec, NT Sway  Condition 4: Foam Surface, EC NT Sec, NT Sway      POSITIONAL TESTING: Right  Dix-Hallpike: upbeating, right nystagmus and Duration: 15 seconds, more intense than L side; then goes to downbeating Left Dix-Hallpike: downbeating, left nystagmus and Duration: >60 sec Right Roll Test: NT Left Roll Test: NT  MOTION  SENSITIVITY:  NOT TESTED  Motion Sensitivity Quotient Intensity: 0 = none, 1 = Lightheaded, 2 = Mild, 3 = Moderate, 4 = Severe, 5 = Vomiting  Intensity  1. Sitting to supine   2. Supine to L side   3. Supine to R side   4. Supine to sitting   5. L Hallpike-Dix   6. Up from L    7. R Hallpike-Dix   8. Up from R    9. Sitting, head tipped to L knee   10. Head up from L knee   11. Sitting, head tipped to R knee   12. Head up from R knee   13. Sitting head turns x5   14.Sitting head nods x5   15. In stance, 180 turn to L    16. In stance, 180 turn to R                                                                                                                                 TREATMENT DATE: 02/24/2024   Canalith Repositioning:  Epley Right: Number of Reps: 1 and Response to Treatment: comment: dizzy in each position, no dizziness upon sitting up; difficult to achieve full neck position due to neck restrictions from previous surgery  PATIENT EDUCATION: Education details: Eval, POC rationale for BPPV treatment, likelihood of multi-canal BPPV based on pt's symptoms/nystagmus today Person educated: Patient Education method: Explanation Education comprehension: verbalized understanding  HOME EXERCISE PROGRAM:  GOALS: Goals reviewed with patient? Yes  SHORT TERM GOALS: = LTGs  LONG TERM GOALS: Target date: 03/25/2024  Pt will be independent with HEP for improved balance and dizziness. Baseline:  Goal status: INITIAL  2.  DHI score to improve to less than or equal to 44, to demo decreased dizziness impacting functional activities. Baseline: 62 Goal status: INITIAL  3.  Pt will report 0/10 dizziness with bed mobility. Baseline:  Goal status: INITIAL  4.  Pt will perform Conditions 2 and 4 of MCTSIB for 30 seconds with mod sway to demo improved balance. Baseline:  Goal status: INITIAL  5.  FGA score to improve to 23/30 for improved balance and gait. Baseline:  TBD Goal status: INITIAL    ASSESSMENT:  CLINICAL IMPRESSION: Patient is a 73 y.o. female who was seen today for physical therapy evaluation and treatment for dizziness and vertigo.  She reports dizziness and imbalance and falls started in May 2025;  she has hx of vertigo in the past.  Of note, in April 2025, she did have pneumonia requiring antibiotics, L shoulder surgery (she is still going to OPPT at another clinic for shoulder rehab); she has hx of ACDF as well as back surgeries and hx of Bell's palsy.  Oculomotor testing is slowed overall and makes patient queasy at some points.  VOR testing also in horizontal direction increases nausea symptoms; HIT difficult to assess due to pt having difficulty relaxing to complete this test item.  Attempted MCTSIB and pt only able to hold Condition 2 for 4 seconds prior opening eyes, with increased sway noted.  Positional testing revealed downbeating nystagmus with L Trenda Craze that did not fatigue and R rotary upbeating nystagmus (with stronger symptoms) that then converted to downbeating nystagmus.  Treated with R Epley, but pt is dizzy in all positions.  She may have multi-canal BPPV that needs to be further assessed and treated.  She will benefit from skilled PT to address the above stated deficits to decrease dizziness and improve overall functional mobility and balance.  OBJECTIVE IMPAIRMENTS: Abnormal gait, decreased balance, decreased mobility, difficulty walking, and dizziness.   ACTIVITY LIMITATIONS: bending, standing, squatting, transfers, bed mobility, reach over head, and locomotion level  PARTICIPATION LIMITATIONS: meal prep, cleaning, driving, shopping, and community activity  PERSONAL FACTORS: 3+ comorbidities: see PMH above are also affecting patient's functional outcome.   REHAB POTENTIAL: Good  CLINICAL DECISION MAKING: Stable/uncomplicated  EVALUATION COMPLEXITY: Low   PLAN:  PT FREQUENCY: 1-2x/week  PT DURATION: 4 weeks  plus eval  PLANNED INTERVENTIONS: 97750- Physical Performance Testing, 97110-Therapeutic exercises, 97530- Therapeutic activity, 97112- Neuromuscular re-education, 97535- Self Care, 02859- Manual therapy, [phone]- Gait training, [phone]- Canalith repositioning, Patient/Family education, and Balance training  PLAN FOR NEXT SESSION: Assess FGA; reassess positional vertigo and treat appropriately; educate in HEP-habituation and balance   Shenia Alan W., PT 02/24/2024, 4:12 PM   [facility] at [facility] [address], [address] [place], [place] [address] Phone # [phone] Fax # [phone]

## 2024-02-25 ENCOUNTER — Other Ambulatory Visit (HOSPITAL_COMMUNITY): Payer: Self-pay

## 2024-02-25 ENCOUNTER — Other Ambulatory Visit: Payer: Self-pay | Admitting: Nurse Practitioner

## 2024-03-01 ENCOUNTER — Telehealth: Payer: Self-pay | Admitting: Gastroenterology

## 2024-03-01 NOTE — Telephone Encounter (Signed)
 Inbound call from [facility] dermatologist with [facility], stating they would like to speak to Dr. Leigh in regards to this patient and discuss treatment options. A good call back number for her is [phone]. Please advise.

## 2024-03-02 NOTE — Telephone Encounter (Signed)
 I had a good conversation with Dr. Hulda of [facility] dermatology, patient has oral lichen planus which has been very difficult to treat.  Subjectively they think has gotten better on [name]  although is not resolved.  Apparently the patient continues to have significant oral symptoms from this.  We talked about other options to treat.  The patient also has difficult to treat Crohn's disease.  She has had neurologic symptoms from Humira in the past, would not use anti-TNF.  She apparently has failed thiopurine's and methotrexate/mycophenolate in the past.  [name]  has provided benefit to her Crohn's although she is not in remission.  We discussed if we wanted to continue that at higher dose for longer of her switching.  Dr. Hulda is going to call her back to discuss options.  From her standpoint they wanted to try her on Rinvoq.  They think this would be better for her lichen planus and I think would be better for her Crohn's.  My main concern is her history of TIA and risk for cardiovascular events.  They feel that they need to discuss risk benefits and given how symptomatic the patient is she may elect to try this.  The other option would be Entyvio.  I do not think this will work as well for her Crohn's, and sounds like not much data on this for the lichen planus although they will look at this as well.  Entyvio would certainly have a more favorable safety profile.  They plan on having a discussion about what they want to do and Dr. Hulda will contact me in regards to next episode of what they decide to do.

## 2024-03-03 ENCOUNTER — Encounter: Payer: Self-pay | Admitting: Podiatry

## 2024-03-03 ENCOUNTER — Ambulatory Visit (INDEPENDENT_AMBULATORY_CARE_PROVIDER_SITE_OTHER): Admitting: Podiatry

## 2024-03-03 VITALS — Ht 63.0 in | Wt 152.0 lb

## 2024-03-03 DIAGNOSIS — M7752 Other enthesopathy of left foot: Secondary | ICD-10-CM

## 2024-03-03 DIAGNOSIS — L600 Ingrowing nail: Secondary | ICD-10-CM

## 2024-03-03 DIAGNOSIS — M25512 Pain in left shoulder: Secondary | ICD-10-CM | POA: Diagnosis not present

## 2024-03-03 NOTE — Progress Notes (Signed)
 Subjective:   Patient ID: Crystal [name], female   DOB: 73 y.o.   MRN: [id number]   HPI Patient presents with pain in the left big toe and also history of ingrown toenail and pain around the nail site.  Both areas are hurting making it hard to wear shoe gear comfortably    ROS      Objective:  Physical Exam  Neurovascular status was found to be intact muscle strength adequate with pain in the left big toe inner phalangeal joint with history of fusion of the first MPJ and thickness of the nailbed with incurvation of the lateral and dorsal area with history of removal of the left medial border that is crusted and healing well     Assessment:  Possibility for a inflammatory condition versus the possibility for nail disease of the entire nailbed with thickness and partial dislocation     Plan:  H&P both conditions reviewed discussed and we Argun to start diclofenac  gel with instructions on usage today cushioning with pad that was dispensed along with soaks and patient will be seen back may require other treatments.  I did do courtesy debridement of nail bed today

## 2024-03-04 ENCOUNTER — Other Ambulatory Visit (HOSPITAL_COMMUNITY): Payer: Self-pay

## 2024-03-04 ENCOUNTER — Other Ambulatory Visit: Payer: Self-pay | Admitting: Nurse Practitioner

## 2024-03-04 NOTE — Therapy (Signed)
 OUTPATIENT PHYSICAL THERAPY VESTIBULAR TREATMENT     Patient Name: Crystal [name] MRN: [id number] DOB:[birth year], 73 y.o., female Today's Date: 03/07/2024  END OF SESSION:  PT End of Session - 03/07/24 1657     Visit Number 2    Number of Visits 9    Date for PT Re-Evaluation 03/25/24    Authorization Type HTA/[employer]    PT Start Time 1611    PT Stop Time 1658    PT Time Calculation (min) 47 min    Equipment Utilized During Treatment Gait belt    Activity Tolerance Patient tolerated treatment well    Behavior During Therapy WFL for tasks assessed/performed           Past Medical History:  Diagnosis Date   Allergy  1966   Anemia    Anxiety 2024   Nerve pain   Arthritis 2003   Rhuematologist   ASCVD (arteriosclerotic cardiovascular disease)    Blood transfusion without reported diagnosis    Hospital-surgery   Cataract 2000's   Surgery   Chronic kidney disease 2020's   [facility] long hospital   Closed nondisplaced subtrochanteric fracture of left femur (HCC) 09/03/2017   September 2018.  Status post surgery by Dr. Celena   Crohn's disease of ileum without complication [facility])    DDD (degenerative disc disease), lumbar    Depression 11/27/2010   Son KIA   Dyspnea    Eye infection    Fibromyalgia    Gait disorder 10/26/2012   GERD (gastroesophageal reflux disease) 2009   Gastroenterologist   GI bleed    Glaucoma 2000's   Stable   Hyperlipidemia    Hypertension    Meds   Hypothyroid    Neuromuscular disorder (HCC) 2013   Bells Palsy- right side of face affected   Periprosthetic supracondylar fracture of femur, left  03/23/2017   Pneumonia    Pre-diabetes    Restless leg syndrome    Rhinitis 06/25/2010   Seizures (HCC) ?   TIA   Sleep apnea    does not use CPAP   Spinal headache    with the C section   Stroke (HCC)    Hx TIAs x 3   last was around 2014   TIA (transient ischemic attack)    3         Ulcer    One   Vitamin D  deficiency     Past Surgical History:  Procedure Laterality Date   ABDOMINAL HYSTERECTOMY  1992   ANTERIOR CERVICAL DECOMPRESSION/DISCECTOMY FUSION 4 LEVELS N/A 06/14/2015   Procedure: Cervical three-four Cervical four-five Cervical five- six Cervical six- seven  Anterior cervical decompression/diskectomy/fusion;  Surgeon: Fairy Levels, MD;  Location: [facility];  Service: Neurosurgery;  Laterality: N/A;  C3-4 C4-5 C5-6 C6-7 Anterior cervical decompression/diskectomy/fusion   APPENDECTOMY  1960   BACK SURGERY     surgeries x 3   CATARACT EXTRACTION, BILATERAL     CESAREAN SECTION  1983 & 1886   DRUG INDUCED ENDOSCOPY N/A 02/25/2022   Procedure: DRUG INDUCED SLEEP ENDOSCOPY;  Surgeon: Mable Lenis, MD;  Location: [facility];  Service: ENT;  Laterality: N/A;   EYE SURGERY     bilateral cataracts   FINGER ARTHROSCOPY     FRACTURE SURGERY  2020   Femur   IMPLANTATION OF HYPOGLOSSAL NERVE STIMULATOR Right 05/29/2022   Procedure: IMPLANTATION OF HYPOGLOSSAL NERVE STIMULATOR;  Surgeon: Mable Lenis, MD;  Location: [facility] OR;  Service: ENT;  Laterality: Right;   JOINT  REPLACEMENT     2   LEFT  1 RIGHT    KNEE SURGERY     ORIF FEMUR FRACTURE Left 03/24/2017   Procedure: OPEN REDUCTION INTERNAL FIXATION (ORIF) FEMUR FRACTURE;  Surgeon: Celena Sharper, MD;  Location: [facility] OR;  Service: Orthopedics;  Laterality: Left;   REVERSE SHOULDER ARTHROPLASTY Left 10/21/2023   Procedure: ARTHROPLASTY, SHOULDER, TOTAL, REVERSE;  Surgeon: Cristy Bonner DASEN, MD;  Location: [facility];  Service: Orthopedics;  Laterality: Left;   ROTATOR CUFF REPAIR Left 2005   2 LEFT  1  RIGHT   SHOULDER ADHESION RELEASE     SPINAL CORD DECOMPRESSION  1998   SPINE SURGERY     TOE SURGERY     LEFT        TOE SURGERY     RIGHT  BIG TOE   TONSILLECTOMY  1960   TRANSFORAMINAL LUMBAR INTERBODY FUSION W/ MIS 1 LEVEL Left 09/05/2021   Procedure: MINIMALLY INVASIVE TRANSFORAMINAL LUMBAR INTERBODY FUSION LUMBAR THREE-FOUR; EXPLORE  FUSION WITH REVISION OF HARDWARE LUMBAR FOUR-FIVE,LEFT SIDE APPROACH;  Surgeon: Carollee Lani BROCKS, DO;  Location: [facility] OR;  Service: Neurosurgery;  Laterality: Left;   Patient Active Problem List   Diagnosis Date Noted   CAP (community acquired pneumonia) 10/29/2023   Hyperhidrosis 10/29/2023   Type 2 diabetes mellitus with chronic kidney disease, without long-term current use of insulin  (HCC) 10/29/2023   Overactive bladder 10/29/2023   Arthritis 10/29/2023   Dizziness 02/11/2022   Lumbar spinal stenosis 09/05/2021   Aortic atherosclerosis (HCC) by Chest CT scan on 09/2017 07/05/2020   CKD stage 2 due to type 2 diabetes mellitus (HCC) 11/16/2019   COVID-19 virus infection 10/19/2019   Presence of artificial knee joint, right 09/20/2019   History of total knee arthroplasty, right 05/17/2019   Pain due to total right knee replacement (HCC) 05/17/2019   History of recurrent TIAs 11/15/2018   Rupture of popliteal cyst of right knee region (complex cyst) 11/03/2018   Diet-controlled type 2 diabetes mellitus (HCC) 07/04/2018   History of adenomatous polyp of colon 10/18/2017   Bronchiectasis without complication (HCC) 10/12/2017   Diverticulosis 10/12/2017   Internal hemorrhoids 10/12/2017   Polypharmacy 04/09/2017   Arthritis of carpometacarpal (CMC) joints of both thumbs 02/12/2017   Cervical disc disease 01/23/2017   Crohn's ileitis (HCC) 01/23/2017   S/P revision of total knee, left 12/18/2016   B12 deficiency anemia 06/16/2016   Sleep talking 03/26/2016   Dyspnea on exertion 11/08/2015   IBS (irritable colon syndrome) 08/09/2015   Spinal stenosis in cervical region 06/14/2015   Other specified arthropathy involving shoulder region 11/23/2014   Obesity (BMI 30.0-34.9) 10/31/2014   Medication management 10/25/2013   Rotator cuff arthropathy 09/15/2013   Hyperlipidemia associated with type 2 diabetes mellitus (HCC)    Hypothyroid    Depression, major, recurrent, in partial remission  (HCC)    Vitamin D  deficiency    ASCVD    Gait disorder 10/26/2012   Knee pain 09/07/2012   Rotator cuff tear 08/23/2012   Bilateral bunions 11/19/2011   Rhinitis 06/25/2010   Obstructive sleep apnea 06/21/2010   RESTLESS LEG SYNDROME 06/21/2010   Essential hypertension 06/21/2010   GERD 06/21/2010   Crohn's Enterocolitis / Regional enteritis 06/21/2010   Fibromyalgia 06/21/2010    PCP: Lendia Boby CROME, NP-C  REFERRING PROVIDER: Lendia Boby CROME, NP-C   REFERRING DIAG:  R42 (ICD-10-CM) - Dizziness  R42 (ICD-10-CM) - Vertigo    THERAPY DIAG:  Dizziness and giddiness  BPPV (benign paroxysmal  positional vertigo), bilateral  Unsteadiness on feet  ONSET DATE: 02/09/2024 (MD referral)  Rationale for Evaluation and Treatment: Rehabilitation  SUBJECTIVE:   SUBJECTIVE STATEMENT: Reports that she has had a little dizziness since last appointment but nothing significant- usually occurs when lying down in bed. Reports lying back and rolling onto the R was making her dizzy. She describes that she was told that she has orthostasis. Pt reports multiple concussions- in 6th grade, 1990's, 2-3 a few years ago. Denies any recent head trauma.   Pt accompanied by: self  PERTINENT HISTORY: concussion, hx of cervical spine surgery, hx of Bell's palsy on R side, neuropathy; reports hx of bilateral shoulder surgery, most recent 09/2023 on L shoulder; see above for full PMH  PAIN:  Are you having pain? Hx of OA-knee and shoulder surgery (undergoing PT post surgery)  PRECAUTIONS: Fall  RED FLAGS: None   WEIGHT BEARING RESTRICTIONS: No  FALLS: Has patient fallen in last 6 months? Yes. Number of falls at least 4  LIVING ENVIRONMENT: Lives with: lives with their spouse Lives in: House/apartment Stairs: steps with rails Has following equipment at home: Single point cane, Environmental consultant - 2 wheeled, and Wheelchair (manual)  PLOF: Independent and Leisure: Enjoys reading  PATIENT GOALS: to get  rid of the dizziness  OBJECTIVE:      TODAY'S TREATMENT: 03/07/24 Activity Comments  FGA 16/30  R roll test Negative; c/o nausea  L roll test Negative   R loaded DH Negative; C/o nausea and sensation of lines moving on ceiling  L loaded DH Negative; C/o nausea and sensation of lines moving on ceiling         [facility] PT Assessment - 03/07/24 0001       Functional Gait  Assessment   Gait assessed  Yes    Gait Level Surface Walks 20 ft in less than 5.5 sec, no assistive devices, good speed, no evidence for imbalance, normal gait pattern, deviates no more than 6 in outside of the 12 in walkway width.    Change in Gait Speed Able to smoothly change walking speed without loss of balance or gait deviation. Deviate no more than 6 in outside of the 12 in walkway width.    Gait with Horizontal Head Turns Performs head turns smoothly with slight change in gait velocity (eg, minor disruption to smooth gait path), deviates 6-10 in outside 12 in walkway width, or uses an assistive device.    Gait with Vertical Head Turns Performs task with moderate change in gait velocity, slows down, deviates 10-15 in outside 12 in walkway width but recovers, can continue to walk.    Gait and Pivot Turn Cannot turn safely, requires assistance to turn and stop.    Step Over Obstacle Is able to step over 2 stacked shoe boxes taped together (9 in total height) without changing gait speed. No evidence of imbalance.    Gait with Narrow Base of Support Ambulates less than 4 steps heel to toe or cannot perform without assistance.    Gait with Eyes Closed Walks 20 ft, slow speed, abnormal gait pattern, evidence for imbalance, deviates 10-15 in outside 12 in walkway width. Requires more than 9 sec to ambulate 20 ft.    Ambulating Backwards Walks 20 ft, slow speed, abnormal gait pattern, evidence for imbalance, deviates 10-15 in outside 12 in walkway width.    Steps Alternating feet, must use rail.    Total Score 16  PATIENT EDUCATION: Education details: edu on exam findings and falls risk; discussed pt's complicated medical hx and possible effects on current sx, edu on benefit of habituation on dizziness; encouraged use of cane  Person educated: Patient Education method: Explanation, Demonstration, Tactile cues, Verbal cues, and Handouts Education comprehension: verbalized understanding and returned demonstration    Note: Objective measures were completed at Evaluation unless otherwise noted.  DIAGNOSTIC FINDINGS: NA for this episode  COGNITION: Overall cognitive status: Within functional limits for tasks assessed   SENSATION: Light touch: Impaired  and RLE neuropathy  POSTURE:  rounded shoulders  Cervical ROM:    Active A/PROM (deg) eval  Flexion 20  Extension 26  Right lateral flexion   Left lateral flexion   Right rotation 38  Left rotation 32  (Blank rows = not tested)   BED MOBILITY:  independent  TRANSFERS: Assistive device utilized: None  Sit to stand: Modified independence Stand to sit: Modified independence  GAIT: Gait pattern: step through pattern Distance walked: clinic distances Assistive device utilized: None Level of assistance: Modified independence Comments: not formally assessed  PATIENT SURVEYS:  DHI: THE DIZZINESS HANDICAP INVENTORY (DHI)  P1. Does looking up increase your problem? 4 = Yes  E2. Because of your problem, do you feel frustrated? 2 = Sometimes  F3. Because of your problem, do you restrict your travel for business or recreation?  0 = No  P4. Does walking down the aisle of a supermarket increase your problems?  2 = Sometimes  F5. Because of your problem, do you have difficulty getting into or out of bed?  4 = Yes  F6. Does your problem significantly restrict your participation in social activities, such as going out to dinner, going to the movies, dancing, or going to parties? 0 = No  F7. Because of your problem, do you have  difficulty reading?  4 = Yes  P8. Does performing more ambitious activities such as sports, dancing, household chores (sweeping or putting dishes away) increase your problems?  2 = Sometimes  E9. Because of your problem, are you afraid to leave your home without having without having someone accompany you?  0 = No  E10. Because of your problem have you been embarrassed in front of others?  0 = No  P11. Do quick movements of your head increase your problem?  4 = Yes  F12. Because of your problem, do you avoid heights?  4 = Yes  P13. Does turning over in bed increase your problem?  4 = Yes  F14. Because of your problem, is it difficult for you to do strenuous homework or yard work? 4 = Yes  E15. Because of your problem, are you afraid people may think you are intoxicated? 0 = No  F16. Because of your problem, is it difficult for you to go for a walk by yourself?  4 = Yes  P17. Does walking down a sidewalk increase your problem?  Not answered  E18.Because of your problem, is it difficult for you to concentrate 2 = Sometimes  F19. Because of your problem, is it difficult for you to walk around your house in the dark? 4 = Yes  E20. Because of your problem, are you afraid to stay home alone?  2 = Sometimes  E21. Because of your problem, do you feel handicapped? 2 = Sometimes  E22. Has the problem placed stress on your relationships with members of your family or friends? 4 = Yes  E23. Because of your problem,  are you depressed?  2 = Sometimes  F24. Does your problem interfere with your job or household responsibilities?  4 = Yes  P25. Does bending over increase your problem?  4 = Yes  TOTAL 62    DHI Scoring Instructions  The patient is asked to answer each question as it pertains to dizziness or unsteadiness problems, specifically  considering their condition during the last month. Questions are designed to incorporate functional (F), physical  (P), and emotional (E) impacts on disability.    Scores greater than 10 points should be referred to balance specialists for further evaluation.   16-34 Points (mild handicap)  36-52 Points (moderate handicap)  54+ Points (severe handicap)  Minimally Detectable Change: 17 points ([address] [place], 1990)  [place], G. SHAUNNA. and [place], C. W. (1990). The development of the Dizziness Handicap Inventory. Archives of Otolaryngology - Head and Neck Surgery 116(4): [id number].   VESTIBULAR ASSESSMENT:  GENERAL OBSERVATION: No acute distress   SYMPTOM BEHAVIOR:  Subjective history: Have hx of vertigo in the past.  Dizziness and falls/unsteadiness started again in May 2025.  Have queasiness in my stomach-it's bad when I turn my head to the right or look up or sitting up to the right-almost fall over.Hx of car sickness, concussion years ago; April L shoulder surgery, hx of Bell's palsy (R), hx of Covid in 01/2023; E coli from dental implants, hx of pneumonia (just after the shoulder surgery) with antibiotics  Non-Vestibular symptoms: neck pain and nausea/vomiting  Type of dizziness: Imbalance (Disequilibrium), Spinning/Vertigo, and Unsteady with head/body turns  Frequency: daily  Duration: seconds-varies  Aggravating factors: Induced by position change: rolling to the right, supine to sit, and sit to stand and Induced by motion: looking up at the ceiling and turning head quickly  Relieving factors: closing eyes and slow movements  Progression of symptoms: worse  OCULOMOTOR EXAM:    Ocular Alignment: L eye is more protruded and elevated; hx of Bell's palsy on R side  Ocular ROM: slowed, reports queasiness  Spontaneous Nystagmus: absent  Gaze-Induced Nystagmus: absent  Smooth Pursuits: slowed  Saccades: intact  Convergence/Divergence: does not become double; R eye does not converge    VESTIBULAR - OCULAR REFLEX:   Slow VOR: Comment: slowed, but reports of little nausea horizontal   VOR Cancellation: Normal  Head-Impulse Test: difficult to  assess-pt has difficulty relaxing  Dynamic Visual Acuity: NT   M-CTSIB  Condition 1: Firm Surface, EO 30 Sec, Normal Sway  Condition 2: Firm Surface, EC 6 Sec, Severe Sway  Condition 3: Foam Surface, EO NT Sec, NT Sway  Condition 4: Foam Surface, EC NT Sec, NT Sway      POSITIONAL TESTING: Right Dix-Hallpike: upbeating, right nystagmus and Duration: 15 seconds, more intense than L side; then goes to downbeating Left Dix-Hallpike: downbeating, left nystagmus and Duration: >60 sec Right Roll Test: NT Left Roll Test: NT  MOTION SENSITIVITY:  NOT TESTED  Motion Sensitivity Quotient Intensity: 0 = none, 1 = Lightheaded, 2 = Mild, 3 = Moderate, 4 = Severe, 5 = Vomiting  Intensity  1. Sitting to supine   2. Supine to L side   3. Supine to R side   4. Supine to sitting   5. L Hallpike-Dix   6. Up from L    7. R Hallpike-Dix   8. Up from R    9. Sitting, head tipped to L knee   10. Head up from L knee   11. Sitting, head tipped to  R knee   12. Head up from R knee   13. Sitting head turns x5   14.Sitting head nods x5   15. In stance, 180 turn to L    16. In stance, 180 turn to R                                                                                                                                 TREATMENT DATE: 02/24/2024   Canalith Repositioning:  Epley Right: Number of Reps: 1 and Response to Treatment: comment: dizzy in each position, no dizziness upon sitting up; difficult to achieve full neck position due to neck restrictions from previous surgery  PATIENT EDUCATION: Education details: Eval, POC rationale for BPPV treatment, likelihood of multi-canal BPPV based on pt's symptoms/nystagmus today Person educated: Patient Education method: Explanation Education comprehension: verbalized understanding  HOME EXERCISE PROGRAM:  GOALS: Goals reviewed with patient? Yes  SHORT TERM GOALS: = LTGs  LONG TERM GOALS: Target date: 03/25/2024  Pt will be independent  with HEP for improved balance and dizziness. Baseline:  Goal status: IN PROGRESS  2.  DHI score to improve to less than or equal to 44, to demo decreased dizziness impacting functional activities. Baseline: 62 Goal status: IN PROGRESS  3.  Pt will report 0/10 dizziness with bed mobility. Baseline:  Goal status: IN PROGRESS  4.  Pt will perform Conditions 2 and 4 of MCTSIB for 30 seconds with mod sway to demo improved balance. Baseline:  Goal status: IN PROGRESS  5.  FGA score to improve to 23/30 for improved balance and gait. Baseline: 16/30 03/07/24 Goal status: IN PROGRESS 03/07/24    ASSESSMENT:  CLINICAL IMPRESSION: Patient arrived to session with report of minimal dizziness since last session. Patient scored 16/30 on FGA, indicating an increased risk of falls. Positional testing was negative, however patient with c/o nausea and perception of movement in several positions. Will plan to treat dizziness with habituation vs. CRM despite no nystagmus seen today and assess pt's response. No complaints at end of session.   OBJECTIVE IMPAIRMENTS: Abnormal gait, decreased balance, decreased mobility, difficulty walking, and dizziness.   ACTIVITY LIMITATIONS: bending, standing, squatting, transfers, bed mobility, reach over head, and locomotion level  PARTICIPATION LIMITATIONS: meal prep, cleaning, driving, shopping, and community activity  PERSONAL FACTORS: 3+ comorbidities: see PMH above are also affecting patient's functional outcome.   REHAB POTENTIAL: Good  CLINICAL DECISION MAKING: Stable/uncomplicated  EVALUATION COMPLEXITY: Low   PLAN:  PT FREQUENCY: 1-2x/week  PT DURATION: 4 weeks plus eval  PLANNED INTERVENTIONS: 97750- Physical Performance Testing, 97110-Therapeutic exercises, 97530- Therapeutic activity, [id number]- Neuromuscular re-education, 97535- Self Care, [id number]- Manual therapy, [phone]- Gait training, [phone]- Canalith repositioning, Patient/Family education, and  Balance training  PLAN FOR NEXT SESSION: plan to treat dizziness with habituation vs. CRM despite no nystagmus seen today and assess pt's response.; initiate HEP to address balance while maintaining safety   Louana Terrilyn Christians, PT,  DPT 03/07/24 5:03 PM  [facility] [address] [place], [address] [place], [place] [address] Phone # [phone] Fax # [phone]

## 2024-03-07 ENCOUNTER — Ambulatory Visit: Attending: Family Medicine | Admitting: Physical Therapy

## 2024-03-07 ENCOUNTER — Encounter: Payer: Self-pay | Admitting: Physical Therapy

## 2024-03-07 DIAGNOSIS — R2681 Unsteadiness on feet: Secondary | ICD-10-CM | POA: Insufficient documentation

## 2024-03-07 DIAGNOSIS — H8113 Benign paroxysmal vertigo, bilateral: Secondary | ICD-10-CM | POA: Diagnosis not present

## 2024-03-07 DIAGNOSIS — R42 Dizziness and giddiness: Secondary | ICD-10-CM | POA: Diagnosis not present

## 2024-03-08 ENCOUNTER — Other Ambulatory Visit (HOSPITAL_COMMUNITY): Payer: Self-pay

## 2024-03-09 DIAGNOSIS — M25512 Pain in left shoulder: Secondary | ICD-10-CM | POA: Diagnosis not present

## 2024-03-10 ENCOUNTER — Other Ambulatory Visit (HOSPITAL_COMMUNITY): Payer: Self-pay

## 2024-03-11 DIAGNOSIS — M25512 Pain in left shoulder: Secondary | ICD-10-CM | POA: Diagnosis not present

## 2024-03-14 ENCOUNTER — Other Ambulatory Visit (HOSPITAL_COMMUNITY): Payer: Self-pay

## 2024-03-16 ENCOUNTER — Other Ambulatory Visit (HOSPITAL_COMMUNITY): Payer: Self-pay

## 2024-03-16 ENCOUNTER — Encounter (HOSPITAL_COMMUNITY): Payer: Self-pay

## 2024-03-17 ENCOUNTER — Encounter: Payer: Self-pay | Admitting: Physical Therapy

## 2024-03-17 ENCOUNTER — Ambulatory Visit: Admitting: Physical Therapy

## 2024-03-17 DIAGNOSIS — R42 Dizziness and giddiness: Secondary | ICD-10-CM

## 2024-03-17 DIAGNOSIS — M7989 Other specified soft tissue disorders: Secondary | ICD-10-CM | POA: Diagnosis not present

## 2024-03-17 DIAGNOSIS — R2681 Unsteadiness on feet: Secondary | ICD-10-CM

## 2024-03-17 DIAGNOSIS — M2550 Pain in unspecified joint: Secondary | ICD-10-CM | POA: Diagnosis not present

## 2024-03-17 DIAGNOSIS — Z23 Encounter for immunization: Secondary | ICD-10-CM | POA: Diagnosis not present

## 2024-03-17 DIAGNOSIS — M79643 Pain in unspecified hand: Secondary | ICD-10-CM | POA: Diagnosis not present

## 2024-03-17 DIAGNOSIS — K5090 Crohn's disease, unspecified, without complications: Secondary | ICD-10-CM | POA: Diagnosis not present

## 2024-03-17 DIAGNOSIS — L439 Lichen planus, unspecified: Secondary | ICD-10-CM | POA: Diagnosis not present

## 2024-03-17 DIAGNOSIS — M797 Fibromyalgia: Secondary | ICD-10-CM | POA: Diagnosis not present

## 2024-03-17 DIAGNOSIS — M1990 Unspecified osteoarthritis, unspecified site: Secondary | ICD-10-CM | POA: Diagnosis not present

## 2024-03-17 DIAGNOSIS — H8113 Benign paroxysmal vertigo, bilateral: Secondary | ICD-10-CM

## 2024-03-17 NOTE — Therapy (Signed)
 OUTPATIENT PHYSICAL THERAPY VESTIBULAR TREATMENT     Patient Name: Crystal [name] MRN: [id number] DOB:16-May-1951, 73 y.o., female Today's Date: 03/17/2024  END OF SESSION:  PT End of Session - 03/17/24 1530     Visit Number 3    Number of Visits 9    Date for Recertification  03/25/24    Authorization Type HTA/[employer]    PT Start Time 1530    PT Stop Time 1610    PT Time Calculation (min) 40 min    Equipment Utilized During Treatment Gait belt    Activity Tolerance Patient tolerated treatment well    Behavior During Therapy [facility] for tasks assessed/performed            Past Medical History:  Diagnosis Date   Allergy  1966   Anemia    Anxiety 2024   Nerve pain   Arthritis 2003   Rhuematologist   ASCVD (arteriosclerotic cardiovascular disease)    Blood transfusion without reported diagnosis    Hospital-surgery   Cataract 2000's   Surgery   Chronic kidney disease 2020's   [facility] long hospital   Closed nondisplaced subtrochanteric fracture of left femur (HCC) 09/03/2017   September 2018.  Status post surgery by Dr. Celena   Crohn's disease of ileum without complication [facility])    DDD (degenerative disc disease), lumbar    Depression 11/27/2010   Son KIA   Dyspnea    Eye infection    Fibromyalgia    Gait disorder 10/26/2012   GERD (gastroesophageal reflux disease) 2009   Gastroenterologist   GI bleed    Glaucoma 2000's   Stable   Hyperlipidemia    Hypertension    Meds   Hypothyroid    Neuromuscular disorder (HCC) 2013   Bells Palsy- right side of face affected   Periprosthetic supracondylar fracture of femur, left  03/23/2017   Pneumonia    Pre-diabetes    Restless leg syndrome    Rhinitis 06/25/2010   Seizures (HCC) ?   TIA   Sleep apnea    does not use CPAP   Spinal headache    with the C section   Stroke (HCC)    Hx TIAs x 3   last was around 2014   TIA (transient ischemic attack)    3         Ulcer    One   Vitamin D  deficiency     Past Surgical History:  Procedure Laterality Date   ABDOMINAL HYSTERECTOMY  1992   ANTERIOR CERVICAL DECOMPRESSION/DISCECTOMY FUSION 4 LEVELS N/A 06/14/2015   Procedure: Cervical three-four Cervical four-five Cervical five- six Cervical six- seven  Anterior cervical decompression/diskectomy/fusion;  Surgeon: Fairy Levels, MD;  Location: [facility];  Service: Neurosurgery;  Laterality: N/A;  C3-4 C4-5 C5-6 C6-7 Anterior cervical decompression/diskectomy/fusion   APPENDECTOMY  1960   BACK SURGERY     surgeries x 3   CATARACT EXTRACTION, BILATERAL     CESAREAN SECTION  1983 & 1886   DRUG INDUCED ENDOSCOPY N/A 02/25/2022   Procedure: DRUG INDUCED SLEEP ENDOSCOPY;  Surgeon: Mable Lenis, MD;  Location: [facility];  Service: ENT;  Laterality: N/A;   EYE SURGERY     bilateral cataracts   FINGER ARTHROSCOPY     FRACTURE SURGERY  2020   Femur   IMPLANTATION OF HYPOGLOSSAL NERVE STIMULATOR Right 05/29/2022   Procedure: IMPLANTATION OF HYPOGLOSSAL NERVE STIMULATOR;  Surgeon: Mable Lenis, MD;  Location: [facility];  Service: ENT;  Laterality: Right;  JOINT REPLACEMENT     2   LEFT  1 RIGHT    KNEE SURGERY     ORIF FEMUR FRACTURE Left 03/24/2017   Procedure: OPEN REDUCTION INTERNAL FIXATION (ORIF) FEMUR FRACTURE;  Surgeon: Celena Sharper, MD;  Location: [facility];  Service: Orthopedics;  Laterality: Left;   REVERSE SHOULDER ARTHROPLASTY Left 10/21/2023   Procedure: ARTHROPLASTY, SHOULDER, TOTAL, REVERSE;  Surgeon: Cristy Bonner DASEN, MD;  Location: [facility];  Service: Orthopedics;  Laterality: Left;   ROTATOR CUFF REPAIR Left 2005   2 LEFT  1  RIGHT   SHOULDER ADHESION RELEASE     SPINAL CORD DECOMPRESSION  1998   SPINE SURGERY     TOE SURGERY     LEFT        TOE SURGERY     RIGHT  BIG TOE   TONSILLECTOMY  1960   TRANSFORAMINAL LUMBAR INTERBODY FUSION W/ MIS 1 LEVEL Left 09/05/2021   Procedure: MINIMALLY INVASIVE TRANSFORAMINAL LUMBAR INTERBODY FUSION LUMBAR THREE-FOUR; EXPLORE  FUSION WITH REVISION OF HARDWARE LUMBAR FOUR-FIVE,LEFT SIDE APPROACH;  Surgeon: Carollee Lani BROCKS, DO;  Location: [facility];  Service: Neurosurgery;  Laterality: Left;   Patient Active Problem List   Diagnosis Date Noted   CAP (community acquired pneumonia) 10/29/2023   Hyperhidrosis 10/29/2023   Type 2 diabetes mellitus with chronic kidney disease, without long-term current use of insulin  (HCC) 10/29/2023   Overactive bladder 10/29/2023   Arthritis 10/29/2023   Dizziness 02/11/2022   Lumbar spinal stenosis 09/05/2021   Aortic atherosclerosis (HCC) by Chest CT scan on 09/2017 07/05/2020   CKD stage 2 due to type 2 diabetes mellitus (HCC) 11/16/2019   COVID-19 virus infection 10/19/2019   Presence of artificial knee joint, right 09/20/2019   History of total knee arthroplasty, right 05/17/2019   Pain due to total right knee replacement (HCC) 05/17/2019   History of recurrent TIAs 11/15/2018   Rupture of popliteal cyst of right knee region (complex cyst) 11/03/2018   Diet-controlled type 2 diabetes mellitus (HCC) 07/04/2018   History of adenomatous polyp of colon 10/18/2017   Bronchiectasis without complication (HCC) 10/12/2017   Diverticulosis 10/12/2017   Internal hemorrhoids 10/12/2017   Polypharmacy 04/09/2017   Arthritis of carpometacarpal (CMC) joints of both thumbs 02/12/2017   Cervical disc disease 01/23/2017   Crohn's ileitis (HCC) 01/23/2017   S/P revision of total knee, left 12/18/2016   B12 deficiency anemia 06/16/2016   Sleep talking 03/26/2016   Dyspnea on exertion 11/08/2015   IBS (irritable colon syndrome) 08/09/2015   Spinal stenosis in cervical region 06/14/2015   Other specified arthropathy involving shoulder region 11/23/2014   Obesity (BMI 30.0-34.9) 10/31/2014   Medication management 10/25/2013   Rotator cuff arthropathy 09/15/2013   Hyperlipidemia associated with type 2 diabetes mellitus (HCC)    Hypothyroid    Depression, major, recurrent, in partial remission  (HCC)    Vitamin D  deficiency    ASCVD    Gait disorder 10/26/2012   Knee pain 09/07/2012   Rotator cuff tear 08/23/2012   Bilateral bunions 11/19/2011   Rhinitis 06/25/2010   Obstructive sleep apnea 06/21/2010   RESTLESS LEG SYNDROME 06/21/2010   Essential hypertension 06/21/2010   GERD 06/21/2010   Crohn's Enterocolitis / Regional enteritis 06/21/2010   Fibromyalgia 06/21/2010    PCP: Lendia Boby CROME, NP-C  REFERRING PROVIDER: Lendia Boby CROME, NP-C   REFERRING DIAG:  R42 (ICD-10-CM) - Dizziness  R42 (ICD-10-CM) - Vertigo    THERAPY DIAG:  Dizziness and giddiness  BPPV (benign  paroxysmal positional vertigo), bilateral  Unsteadiness on feet  ONSET DATE: 02/09/2024 (MD referral)  Rationale for Evaluation and Treatment: Rehabilitation  SUBJECTIVE:   SUBJECTIVE STATEMENT: Pt reports she went to the beach -- went up and down a lot of stairs. Had one fall backwards. Hasn't noticed any dizziness. Just came back from the beach last night.   Pt accompanied by: self  PERTINENT HISTORY: concussion, hx of cervical spine surgery, hx of Bell's palsy on R side, neuropathy; reports hx of bilateral shoulder surgery, most recent 09/2023 on L shoulder; see above for full PMH  PAIN:  Are you having pain? Hx of OA-knee and shoulder surgery (undergoing PT post surgery) Aching 7 or 8/10 generalized  PRECAUTIONS: Fall  RED FLAGS: None   WEIGHT BEARING RESTRICTIONS: No  FALLS: Has patient fallen in last 6 months? Yes. Number of falls at least 4  LIVING ENVIRONMENT: Lives with: lives with their spouse Lives in: House/apartment Stairs: steps with rails Has following equipment at home: Single point cane, Environmental consultant - 2 wheeled, and Wheelchair (manual)  PLOF: Independent and Leisure: Enjoys reading  PATIENT GOALS: to get rid of the dizziness  OBJECTIVE:  TODAY'S TREATMENT: 03/17/24 Activity Comments  L sidelying Dix-hallpike Negative  R sidelying Dix-hallpike Negative  R roll  test Negative  L roll test Negative   Feet together EC x30  Feet together EO head nods and head turns x 20  Feet apart EC on airex x30 Moderate sway   Moderate sway  Diagonals VOR cancellation x10 Vestibular ball x10 CW & CCW        TODAY'S TREATMENT: 03/07/24 Activity Comments  FGA 16/30  R roll test Negative; c/o nausea  L roll test Negative   R loaded DH Negative; C/o nausea and sensation of lines moving on ceiling  L loaded DH Negative; C/o nausea and sensation of lines moving on ceiling       PATIENT EDUCATION: Education details: HEP Person educated: Patient Education method: Explanation Education comprehension: verbalized understanding  HOME EXERCISE PROGRAM: Access Code: [email or web address] URL: [email or web address] Date: 03/17/2024 Prepared by: Candelaria Pies April Marie Teyanna Thielman  Exercises - Corner Balance Feet Together With Eyes Closed  - 1 x daily - 7 x weekly - 2 sets - 30 sec hold - Standing Vestibular Ball Circles  - 1 x daily - 7 x weekly - 2 sets - 5 reps - Standing Vestibular Ball Diagonal with Ground Touches  - 1 x daily - 7 x weekly - 2 sets - 5 reps  Note: Objective measures were completed at Evaluation unless otherwise noted.  DIAGNOSTIC FINDINGS: NA for this episode  COGNITION: Overall cognitive status: Within functional limits for tasks assessed   SENSATION: Light touch: Impaired  and RLE neuropathy  POSTURE:  rounded shoulders  Cervical ROM:    Active A/PROM (deg) eval  Flexion 20  Extension 26  Right lateral flexion   Left lateral flexion   Right rotation 38  Left rotation 32  (Blank rows = not tested)   BED MOBILITY:  independent  TRANSFERS: Assistive device utilized: None  Sit to stand: Modified independence Stand to sit: Modified independence  GAIT: Gait pattern: step through pattern Distance walked: clinic distances Assistive device utilized: None Level of assistance: Modified independence Comments: not  formally assessed  PATIENT SURVEYS:  DHI: THE DIZZINESS HANDICAP INVENTORY (DHI)  P1. Does looking up increase your problem? 4 = Yes 4 (when lying down)  E2. Because of your problem, do you feel frustrated? 2 = Sometimes  0  F3. Because of your problem, do you restrict your travel for business or recreation?  0 = No 0  P4. Does walking down the aisle of a supermarket increase your problems?  2 = Sometimes 0  F5. Because of your problem, do you have difficulty getting into or out of bed?  4 = Yes 4 (still tips to the right)  F6. Does your problem significantly restrict your participation in social activities, such as going out to dinner, going to the movies, dancing, or going to parties? 0 = No 0  F7. Because of your problem, do you have difficulty reading?  4 = Yes 0  P8. Does performing more ambitious activities such as sports, dancing, household chores (sweeping or putting dishes away) increase your problems?  2 = Sometimes 0  E9. Because of your problem, are you afraid to leave your home without having without having someone accompany you?  0 = No 0  E10. Because of your problem have you been embarrassed in front of others?  0 = No 0  P11. Do quick movements of your head increase your problem?  4 = Yes 4  F12. Because of your problem, do you avoid heights?  4 = Yes 4  P13. Does turning over in bed increase your problem?  4 = Yes 4 nausea  F14. Because of your problem, is it difficult for you to do strenuous homework or yard work? 4 = Yes 0  E15. Because of your problem, are you afraid people may think you are intoxicated? 0 = No 0  F16. Because of your problem, is it difficult for you to go for a walk by yourself?  4 = Yes 0 limited by knees and feet but not dizziness  P17. Does walking down a sidewalk increase your problem?  Not answered 0  E18.Because of your problem, is it difficult for you to concentrate 2 = Sometimes 0  F19. Because of your problem, is it difficult for you to walk  around your house in the dark? 4 = Yes 4  E20. Because of your problem, are you afraid to stay home alone?  2 = Sometimes 0  E21. Because of your problem, do you feel handicapped? 2 = Sometimes 2  E22. Has the problem placed stress on your relationships with members of your family or friends? 4 = Yes 0  E23. Because of your problem, are you depressed?  2 = Sometimes 2  F24. Does your problem interfere with your job or household responsibilities?  4 = Yes 0  P25. Does bending over increase your problem?  4 = Yes 4  TOTAL 62 (eval) 24 (03/17/24)    DHI Scoring Instructions  The patient is asked to answer each question as it pertains to dizziness or unsteadiness problems, specifically  considering their condition during the last month. Questions are designed to incorporate functional (F), physical  (P), and emotional (E) impacts on disability.   Scores greater than 10 points should be referred to balance specialists for further evaluation.   16-34 Points (mild handicap)  36-52 Points (moderate handicap)  54+ Points (severe handicap)  Minimally Detectable Change: 17 points ([address] [place], 1990)  [place], G. SHAUNNA. and [place], C. W. (1990). The development of the Dizziness Handicap Inventory. Archives of Otolaryngology - Head and Neck Surgery 116(4): [id number].   VESTIBULAR ASSESSMENT:  GENERAL OBSERVATION: No acute distress   SYMPTOM BEHAVIOR:  Subjective history: Have hx of vertigo in the past.  Dizziness  and falls/unsteadiness started again in May 2025.  Have queasiness in my stomach-it's bad when I turn my head to the right or look up or sitting up to the right-almost fall over.Hx of car sickness, concussion years ago; April L shoulder surgery, hx of Bell's palsy (R), hx of Covid in 01/2023; E coli from dental implants, hx of pneumonia (just after the shoulder surgery) with antibiotics  Non-Vestibular symptoms: neck pain and nausea/vomiting  Type of dizziness: Imbalance  (Disequilibrium), Spinning/Vertigo, and Unsteady with head/body turns  Frequency: daily  Duration: seconds-varies  Aggravating factors: Induced by position change: rolling to the right, supine to sit, and sit to stand and Induced by motion: looking up at the ceiling and turning head quickly  Relieving factors: closing eyes and slow movements  Progression of symptoms: worse  OCULOMOTOR EXAM:    Ocular Alignment: L eye is more protruded and elevated; hx of Bell's palsy on R side  Ocular ROM: slowed, reports queasiness  Spontaneous Nystagmus: absent  Gaze-Induced Nystagmus: absent  Smooth Pursuits: slowed  Saccades: intact  Convergence/Divergence: does not become double; R eye does not converge    VESTIBULAR - OCULAR REFLEX:   Slow VOR: Comment: slowed, but reports of little nausea horizontal   VOR Cancellation: Normal  Head-Impulse Test: difficult to assess-pt has difficulty relaxing  Dynamic Visual Acuity: NT   M-CTSIB 03/17/24  Condition 1: Firm Surface, EO 30 Sec, Normal Sway 30 sec, normal sway  Condition 2: Firm Surface, EC 6 Sec, Severe Sway 30 sec, moderate sway  Condition 3: Foam Surface, EO NT Sec, NT Sway 30 sec, normal sway  Condition 4: Foam Surface, EC NT Sec, NT Sway 30 sec, moderate sway      POSITIONAL TESTING: Right Dix-Hallpike: upbeating, right nystagmus and Duration: 15 seconds, more intense than L side; then goes to downbeating Left Dix-Hallpike: downbeating, left nystagmus and Duration: >60 sec Right Roll Test: NT Left Roll Test: NT  MOTION SENSITIVITY:  NOT TESTED on eval  03/17/24  Motion Sensitivity Quotient Intensity: 0 = none, 1 = Lightheaded, 2 = Mild, 3 = Moderate, 4 = Severe, 5 = Vomiting  Intensity  1. Sitting to supine 0  2. Supine to L side 0  3. Supine to R side 0  4. Supine to sitting 1  5. L Hallpike-Dix 0  6. Up from L  0  7. R Hallpike-Dix 0  8. Up from R  1  9. Sitting, head tipped to L knee 0  10. Head up from L knee 1  11.  Sitting, head tipped to R knee 0  12. Head up from R knee 2  13. Sitting head turns x5 0  14.Sitting head nods x5 0  15. In stance, 180 turn to L  0  16. In stance, 180 turn to R 0  GOALS: Goals reviewed with patient? Yes  SHORT TERM GOALS: = LTGs  LONG TERM GOALS: Target date: 03/25/2024  Pt will be independent with HEP for improved balance and dizziness. Baseline:  Goal status: IN PROGRESS  2.  DHI score to improve to less than or equal to 44, to demo decreased dizziness impacting functional activities. Baseline: 62 Goal status: IN PROGRESS  3.  Pt will report 0/10 dizziness with bed mobility. Baseline:  Goal status: IN PROGRESS  4.  Pt will perform Conditions 2 and 4 of MCTSIB for 30 seconds with mod sway to demo improved balance. Baseline:  Goal status: MET 03/17/24  5.  FGA score to improve to 23/30 for improved balance and gait. Baseline: 16/30 03/07/24 Goal status: IN PROGRESS 03/07/24    ASSESSMENT:  CLINICAL IMPRESSION: Pt arrives reporting no sensations of spinning. Went through [facility] with pt demonstrating a much improved score. Still feels it primarily with bending so added habituation exercises today for pt to work on at home. Canalith testing was all negative this session. Recheck goals and will likely be ready for d/c next session.   OBJECTIVE IMPAIRMENTS: Abnormal gait, decreased balance, decreased mobility, difficulty walking, and dizziness.   ACTIVITY LIMITATIONS: bending, standing, squatting, transfers, bed mobility, reach over head, and locomotion level  PARTICIPATION LIMITATIONS: meal prep, cleaning, driving, shopping, and community activity  PERSONAL FACTORS: 3+ comorbidities: see PMH above are also affecting patient's functional outcome.   REHAB POTENTIAL: Good  CLINICAL DECISION MAKING: Stable/uncomplicated  EVALUATION  COMPLEXITY: Low   PLAN:  PT FREQUENCY: 1-2x/week  PT DURATION: 4 weeks plus eval  PLANNED INTERVENTIONS: 97750- Physical Performance Testing, 97110-Therapeutic exercises, 97530- Therapeutic activity, [id number]- Neuromuscular re-education, 97535- Self Care, [id number]- Manual therapy, [id number]- Gait training, [phone]- Canalith repositioning, Patient/Family education, and Balance training  PLAN FOR NEXT SESSION: Finalize balance HEP. Recheck goals plan for d/c if symptoms remain well controlled   Arhan Mcmanamon April Ma L [place], [place], DPT 03/17/24 3:30 PM  [facility] at [facility] [address] [place], [address] [place], [place] [address] Phone # [phone] Fax # [phone]

## 2024-03-18 ENCOUNTER — Encounter: Payer: Self-pay | Admitting: Family Medicine

## 2024-03-18 ENCOUNTER — Ambulatory Visit: Admitting: Family Medicine

## 2024-03-18 VITALS — BP 126/78 | HR 71 | Temp 97.6°F | Ht 63.0 in | Wt 153.8 lb

## 2024-03-18 DIAGNOSIS — I2510 Atherosclerotic heart disease of native coronary artery without angina pectoris: Secondary | ICD-10-CM | POA: Diagnosis not present

## 2024-03-18 DIAGNOSIS — E559 Vitamin D deficiency, unspecified: Secondary | ICD-10-CM

## 2024-03-18 DIAGNOSIS — E039 Hypothyroidism, unspecified: Secondary | ICD-10-CM

## 2024-03-18 DIAGNOSIS — G720 Drug-induced myopathy: Secondary | ICD-10-CM | POA: Diagnosis not present

## 2024-03-18 DIAGNOSIS — N182 Chronic kidney disease, stage 2 (mild): Secondary | ICD-10-CM

## 2024-03-18 DIAGNOSIS — R058 Other specified cough: Secondary | ICD-10-CM | POA: Diagnosis not present

## 2024-03-18 DIAGNOSIS — I10 Essential (primary) hypertension: Secondary | ICD-10-CM | POA: Diagnosis not present

## 2024-03-18 DIAGNOSIS — E785 Hyperlipidemia, unspecified: Secondary | ICD-10-CM | POA: Diagnosis not present

## 2024-03-18 DIAGNOSIS — E119 Type 2 diabetes mellitus without complications: Secondary | ICD-10-CM

## 2024-03-18 DIAGNOSIS — E1169 Type 2 diabetes mellitus with other specified complication: Secondary | ICD-10-CM

## 2024-03-18 DIAGNOSIS — E1122 Type 2 diabetes mellitus with diabetic chronic kidney disease: Secondary | ICD-10-CM | POA: Diagnosis not present

## 2024-03-18 DIAGNOSIS — I700 Atherosclerosis of aorta: Secondary | ICD-10-CM

## 2024-03-18 DIAGNOSIS — T466X5A Adverse effect of antihyperlipidemic and antiarteriosclerotic drugs, initial encounter: Secondary | ICD-10-CM

## 2024-03-18 MED ORDER — BENZONATATE 200 MG PO CAPS: 200.0000 mg | ORAL_CAPSULE | Freq: Two times a day (BID) | ORAL | 0 refills | Status: DC | PRN

## 2024-03-18 NOTE — Progress Notes (Signed)
 Subjective:     Patient ID: Crystal [name], female    DOB: 06-06-1951, 74 y.o.   MRN: [id number]  Chief Complaint  Patient presents with   Follow-up    6 wks-constant LBP    Pre-visit Screening Tool Documentation    HPI  Discussed the use of AI scribe software for clinical note transcription with the patient, who gave verbal consent to proceed.  History of Present Illness Crystal [name] Pat is a 73 year old female with hyperlipidemia, hypothyroidism, and hypertension who presents for follow-up of chronic health conditions.  Cough and dyspnea - Dry cough began over the weekend - Similar episodes in the past managed effectively with Tessalon  Perles - Shortness of breath with exertion is chronic, not related to cough - Cleared of pneumonia after surgery in April  Hyperlipidemia management - Cholesterol remains difficult to control - Plans to return for fasting labs next week - Uses polyunsaturated oils and Manuka honey for cholesterol management - Previously experienced sharp leg pains with statin therapy - Dietary modifications include reducing brioche bread and cheese  Hypothyroidism management - Takes levothyroxine  88 mcg every morning on an empty stomach  Peripheral edema and neuropathic pain - Swelling and sharp pains in toes attributed to arthritis and prior surgical interventions - History of pins and a bracket in toes - Nerve problems extending from legs to feet - Currently seeking a new neurosurgeon  Denies being on medication for diabetes in the past. Diet controlled       05/17/2023    8:47 PM 03/04/2023   10:15 PM 08/19/2022    3:46 PM 04/27/2022   12:08 AM 07/16/2021   10:41 AM  Depression screen PHQ 2/9  Decreased Interest 0 0 0 0 0  Down, Depressed, Hopeless 0 0 0 0 0  PHQ - 2 Score 0 0 0 0 0      Health Maintenance Due  Topic Date Due   Medicare Annual Wellness (AWV)  08/20/2023   Influenza Vaccine  01/29/2024    COVID-19 Vaccine (9 - 2025-26 season) 02/29/2024    Past Medical History:  Diagnosis Date   Allergy  1966   Anemia    Anxiety 2024   Nerve pain   Arthritis 2003   Rhuematologist   ASCVD (arteriosclerotic cardiovascular disease)    Blood transfusion without reported diagnosis    Hospital-surgery   Cataract 2000's   Surgery   Chronic kidney disease 2020's   [facility] long hospital   Closed nondisplaced subtrochanteric fracture of left femur (HCC) 09/03/2017   September 2018.  Status post surgery by Dr. Celena   Crohn's disease of ileum without complication [facility])    DDD (degenerative disc disease), lumbar    Depression 11/27/2010   Son KIA   Dyspnea    Eye infection    Fibromyalgia    Gait disorder 10/26/2012   GERD (gastroesophageal reflux disease) 2009   Gastroenterologist   GI bleed    Glaucoma 2000's   Stable   Hyperlipidemia    Hypertension    Meds   Hypothyroid    Neuromuscular disorder (HCC) 2013   Bells Palsy- right side of face affected   Periprosthetic supracondylar fracture of femur, left  03/23/2017   Pneumonia    Pre-diabetes    Restless leg syndrome    Rhinitis 06/25/2010   Seizures (HCC) ?   TIA   Sleep apnea    does not use CPAP   Spinal headache    with the C  section   Stroke (HCC)    Hx TIAs x 3   last was around 2014   TIA (transient ischemic attack)    3         Ulcer    One   Vitamin D  deficiency     Past Surgical History:  Procedure Laterality Date   ABDOMINAL HYSTERECTOMY  1992   ANTERIOR CERVICAL DECOMPRESSION/DISCECTOMY FUSION 4 LEVELS N/A 06/14/2015   Procedure: Cervical three-four Cervical four-five Cervical five- six Cervical six- seven  Anterior cervical decompression/diskectomy/fusion;  Surgeon: Fairy Levels, MD;  Location: [facility];  Service: Neurosurgery;  Laterality: N/A;  C3-4 C4-5 C5-6 C6-7 Anterior cervical decompression/diskectomy/fusion   APPENDECTOMY  1960   BACK SURGERY     surgeries x 3   CATARACT EXTRACTION,  BILATERAL     CESAREAN SECTION  1983 & 1886   DRUG INDUCED ENDOSCOPY N/A 02/25/2022   Procedure: DRUG INDUCED SLEEP ENDOSCOPY;  Surgeon: Mable Lenis, MD;  Location: [facility];  Service: ENT;  Laterality: N/A;   EYE SURGERY     bilateral cataracts   FINGER ARTHROSCOPY     FRACTURE SURGERY  2020   Femur   IMPLANTATION OF HYPOGLOSSAL NERVE STIMULATOR Right 05/29/2022   Procedure: IMPLANTATION OF HYPOGLOSSAL NERVE STIMULATOR;  Surgeon: Mable Lenis, MD;  Location: [facility];  Service: ENT;  Laterality: Right;   JOINT REPLACEMENT     2   LEFT  1 RIGHT    KNEE SURGERY     ORIF FEMUR FRACTURE Left 03/24/2017   Procedure: OPEN REDUCTION INTERNAL FIXATION (ORIF) FEMUR FRACTURE;  Surgeon: Celena Sharper, MD;  Location: [facility] OR;  Service: Orthopedics;  Laterality: Left;   REVERSE SHOULDER ARTHROPLASTY Left 10/21/2023   Procedure: ARTHROPLASTY, SHOULDER, TOTAL, REVERSE;  Surgeon: Cristy Bonner DASEN, MD;  Location: [facility];  Service: Orthopedics;  Laterality: Left;   ROTATOR CUFF REPAIR Left 2005   2 LEFT  1  RIGHT   SHOULDER ADHESION RELEASE     SPINAL CORD DECOMPRESSION  1998   SPINE SURGERY     TOE SURGERY     LEFT        TOE SURGERY     RIGHT  BIG TOE   TONSILLECTOMY  1960   TRANSFORAMINAL LUMBAR INTERBODY FUSION W/ MIS 1 LEVEL Left 09/05/2021   Procedure: MINIMALLY INVASIVE TRANSFORAMINAL LUMBAR INTERBODY FUSION LUMBAR THREE-FOUR; EXPLORE FUSION WITH REVISION OF HARDWARE LUMBAR FOUR-FIVE,LEFT SIDE APPROACH;  Surgeon: Carollee Lani BROCKS, DO;  Location: [facility] OR;  Service: Neurosurgery;  Laterality: Left;    Family History  Problem Relation Age of Onset   Cancer Mother    Diabetes Mother    Osteoporosis Mother    Breast cancer Mother    Arthritis Mother    Miscarriages / [place] Mother    Obesity Mother    Heart disease Father    Hypertension Father    Hyperlipidemia Father    Neuropathy Father    Stroke Father    Leukemia Sister    Lupus Sister    Early death Sister     Miscarriages / [place] Sister    Depression Sister    Rheum arthritis Sister    Cancer Sister    Miscarriages / [place] Sister    Migraines Daughter    Cancer Daughter    Anxiety disorder Daughter    Cancer Son    Early death Son    Liver disease Neg Hx    Colon cancer Neg Hx    Esophageal cancer  Neg Hx     Social History   Socioeconomic History   Marital status: Married    Spouse name: Not on file   Number of children: 2   Years of education: Not on file   Highest education level: Bachelor's degree (e.g., BA, AB, BS)  Occupational History   Occupation: Retired Advertising account executive: DISABLED  Tobacco Use   Smoking status: Never    Passive exposure: Past   Smokeless tobacco: Never  Vaping Use   Vaping status: Never Used  Substance and Sexual Activity   Alcohol use: Not Currently    Alcohol/week: 4.0 standard drinks of alcohol    Types: 2 Cans of beer, 2 Standard drinks or equivalent per week    Comment: On and off   Drug use: No   Sexual activity: Not Currently    Birth control/protection: None    Comment: Hysterectomy  Other Topics Concern   Not on file  Social History Narrative   Drinks about 1 cup of coffee a day, occasional coke zero    Social Drivers of Corporate investment banker Strain: Low Risk  (08/23/2023)   Overall Financial Resource Strain (CARDIA)    Difficulty of Paying Living Expenses: Not hard at all  Food Insecurity: No Food Insecurity (08/23/2023)   Hunger Vital Sign    Worried About Running Out of Food in the Last Year: Never true    Ran Out of Food in the Last Year: Never true  Transportation Needs: No Transportation Needs (08/23/2023)   PRAPARE - Transportation    Lack of Transportation (Medical): No    Lack of Transportation (Non-Medical): No  Physical Activity: Unknown (08/23/2023)   Exercise Vital Sign    Days of Exercise per Week: Patient declined    Minutes of Exercise per Session: Not on file  Stress: Stress Concern  Present (08/23/2023)   [employer] of Occupational Health - Occupational Stress Questionnaire    Feeling of Stress : To some extent  Social Connections: Unknown (08/23/2023)   Social Connection and Isolation Panel    Frequency of Communication with Friends and Family: More than three times a week    Frequency of Social Gatherings with Friends and Family: Once a week    Attends Religious Services: Patient declined    Database administrator or Organizations: Yes    Attends Engineer, structural: More than 4 times per year    Marital Status: Married  Catering manager Violence: Not on file    Outpatient Medications Prior to Visit  Medication Sig Dispense Refill   acyclovir  (ZOVIRAX ) 400 MG tablet TAKE 1 TABLET BY MOUTH EVERY DAY 90 tablet 1   bisoprolol -hydrochlorothiazide  (ZIAC ) 10-6.25 MG tablet Take 1 tablet by mouth daily. 90 tablet 2   Black Pepper-Turmeric (TURMERIC PLUS BLACK PEPPER EXT PO) Take 1 tablet by mouth daily.     buPROPion  (WELLBUTRIN  XL) 300 MG 24 hr tablet Take 1 tablet (300 mg total) by mouth daily for mood, focus & concentration. 90 tablet 0   carboxymethylcellulose (REFRESH PLUS) 0.5 % SOLN Place 1 drop into both eyes 3 (three) times daily as needed (dry eyes).     clobetasol ointment (TEMOVATE) 0.05 % Apply 1 Application topically 3 (three) times daily as needed (lichen planus).     clotrimazole  (MYCELEX ) 10 MG troche Dissolve 1 tablet (10 mg total) in mouth 3 (three) times daily. 140 Troche 11   dexamethasone  (DECADRON ) 0.5 MG/5ML solution Take 5 mLs (0.5  mg total) by mouth 3 (three) times daily. (Patient taking differently: Take 0.5 mg by mouth 2 (two) times daily.) 100 mL 1   diclofenac  Sodium (VOLTAREN ) 1 % GEL Apply 2 grams topically daily as needed (pain). 100 g 1   DULoxetine  (CYMBALTA ) 60 MG capsule Take 1 capsule (60 mg total) by mouth daily for chronic pain. 90 capsule 3   fenofibrate  micronized (LOFIBRA) 134 MG capsule Take 1 capsule (134 mg total)  by mouth daily before breakfast 90 capsule 3   Ferrous Sulfate (IRON PO) Take 25 mg by mouth daily.     fluconazole  (DIFLUCAN ) 200 MG tablet Take 1 tablet (200 mg total) by mouth once a week. 26 tablet 0   fluticasone  (FLONASE ) 50 MCG/ACT nasal spray Use  1-2 Sprays  each nostril   1-2 x /day  as needed 16 g 5   gabapentin  (NEURONTIN ) 300 MG capsule Take  1 capsule  3 x /day  or Chronic Pain    (Dx: m79.7)                                               /                                TAKE                                   BY                                  MOUTH     gabapentin  (NEURONTIN ) 600 MG tablet Take 1 tablet (600 mg total) by mouth at bedtime. 30 tablet 5   gabapentin  (NEURONTIN ) 800 MG tablet Take 1 tablet (800 mg total) by mouth at bedtime. 30 tablet 3   Glycerin, Laxative, (FLEET LIQUID GLYCERIN SUPP RE) Place 1 Dose rectally daily as needed (Constipation).     levothyroxine  (SYNTHROID ) 88 MCG tablet Take 1 tablet (88 mcg total) by mouth daily on an empty stomach with water  for 30 minutes.  No antacids, calcium , magnesium  for 4 hours. Avoid biotin. 90 tablet 3   meclizine  (ANTIVERT ) 25 MG tablet TAKE 1 TABLET (25 MG TOTAL) BY MOUTH 3 (THREE) TIMES DAILY AS NEEDED FOR DIZZINESS. 90 tablet 0   mupirocin ointment (BACTROBAN) 2 % Apply 1 Application topically 3 (three) times daily as needed (lichen planus).     neomycin -polymyxin-hydrocortisone (CORTISPORIN) OTIC solution Place 3 drops into both ears daily as needed (ear inflammation).     omeprazole  (PRILOSEC) 40 MG capsule Take 1 capsule (40 mg total) by mouth daily. 90 capsule 1   OVER THE COUNTER MEDICATION Place 1 drop under the tongue daily. D3 5000 units - K2 120 mcg     oxybutynin  (DITROPAN ) 5 MG tablet Take 1 tablet 2 x /day fr Excessive Sweating (Patient taking differently: daily. Take 1 tablet 2 x /day fr Excessive Sweating) 180 tablet 1   Plecanatide  (TRULANCE ) 3 MG TABS Take 1 tablet (3 mg total) by mouth daily. Lot: [id number], exp:  09-2024 3 tablet 0   polyethylene glycol (MIRALAX  / GLYCOLAX ) 17 g packet Take 17-51 g by mouth 3 (three) times  daily. Take 3 doses daily     promethazine  (PHENERGAN ) 25 MG tablet Take 1 tablet (25 mg total) by mouth every 6 (six) hours as needed for nausea or vomiting. 10 tablet 0   Rectal Protectant-Emollient (CALMOL-4) 76-10 % SUPP Use as directed once to twice daily     Risankizumab -rzaa ([name] ) 360 MG/2.4ML SOCT Inject 1 Cartridge into the skin every 8 (eight) weeks. 2.4 mL 5   rOPINIRole  (REQUIP ) 2 MG tablet Take 1 tablet (2 mg total) by mouth 2 (two) times daily AND 2 tablets (4 mg total) at bedtime for restless legs 360 tablet 3   tizanidine  (ZANAFLEX ) 2 MG capsule Take 1 capsule (2 mg total) by mouth at bedtime for muscle spasm. 90 capsule 3   traMADol  (ULTRAM ) 50 MG tablet Take 1-2 tablets (50-100 mg total) by mouth every 6 (six) hours as needed (for SEVERE pain ONLY.). NO REFILLS 30 tablet 0   Vitamin D -Vitamin K (K2-D3 5000 PO) Take 5,000 Units by mouth daily.     No facility-administered medications prior to visit.    Allergies  Allergen Reactions   Methocarbamol  Shortness Of Breath, Nausea Only and Other (See Comments)    Other reaction(s): Dizziness, psychological reaction, disoriented Sleepy and weight loss    Codeine Other (See Comments)    Headache   Statins Other (See Comments)    Extreme pain   Azathioprine    Other Other (See Comments)    MINT=sore mouth and stomach upset   Oxycodone      Dizziness, unbalanced.   Amitriptyline Other (See Comments)    Confusion and hallucinations   Erythromycin Base Rash    Flushing/red face.   Fish Oil Nausea Only   Flax Seed [Flax Seed Oil] Rash   Flaxseed (Linseed) Rash   Hydrocodone  Itching and Rash        Morphine Nausea And Vomiting and Rash   Nsaids Nausea Only   Nuvigil [Armodafinil] Other (See Comments)    Unspecified   Sertraline Rash   Sinemet [Carbidopa-Levodopa] Rash   Sulfa Antibiotics Rash   Tolmetin  Nausea Only    Review of Systems  Constitutional:  Negative for chills, fever and malaise/fatigue.  Respiratory:  Positive for cough. Negative for sputum production, shortness of breath and wheezing.   Cardiovascular:  Negative for chest pain, palpitations and leg swelling.  Gastrointestinal:  Negative for abdominal pain, constipation, diarrhea, nausea and vomiting.  Genitourinary:  Negative for dysuria, frequency and urgency.  Musculoskeletal:  Positive for back pain and joint pain.  Neurological:  Negative for dizziness, focal weakness and headaches.  Psychiatric/Behavioral:  Negative for depression. The patient is not nervous/anxious.        Objective:    Physical Exam Constitutional:      General: She is not in acute distress.    Appearance: She is not ill-appearing.  HENT:     Mouth/Throat:     Mouth: Mucous membranes are moist.     Pharynx: Oropharynx is clear.  Eyes:     Extraocular Movements: Extraocular movements intact.     Conjunctiva/sclera: Conjunctivae normal.     Pupils: Pupils are equal, round, and reactive to light.  Cardiovascular:     Rate and Rhythm: Normal rate and regular rhythm.  Pulmonary:     Effort: Pulmonary effort is normal.     Breath sounds: Normal breath sounds.  Musculoskeletal:     Cervical back: Normal range of motion and neck supple. No tenderness.     Right lower leg:  No edema.     Left lower leg: No edema.  Lymphadenopathy:     Cervical: No cervical adenopathy.  Skin:    General: Skin is warm and dry.  Neurological:     General: No focal deficit present.     Mental Status: She is alert and oriented to person, place, and time.     Motor: No weakness.     Coordination: Coordination normal.     Gait: Gait normal.  Psychiatric:        Mood and Affect: Mood normal.        Behavior: Behavior normal.        Thought Content: Thought content normal.      BP 126/78   Pulse 71   Temp 97.6 F (36.4 C)   Ht 5' 3 (1.6 m)   Wt 153 lb  12.8 oz (69.8 kg)   SpO2 96%   BMI 27.24 kg/m  Wt Readings from Last 3 Encounters:  03/18/24 153 lb 12.8 oz (69.8 kg)  03/03/24 152 lb (68.9 kg)  02/05/24 152 lb (68.9 kg)       Assessment & Plan:   Problem List Items Addressed This Visit     Aortic atherosclerosis (HCC) by Chest CT scan on 09/2017   Relevant Orders   AMB Referral VBCI Care Management   Lipid panel   ASCVD   Relevant Orders   Lipid panel   CKD stage 2 due to type 2 diabetes mellitus (HCC)   Relevant Orders   Comprehensive metabolic panel with GFR   Diet-controlled type 2 diabetes mellitus (HCC)   Relevant Orders   CBC with Differential/Platelet   Comprehensive metabolic panel with GFR   Hemoglobin A1c   Essential hypertension   Relevant Orders   CBC with Differential/Platelet   Comprehensive metabolic panel with GFR   Hyperlipidemia associated with type 2 diabetes mellitus (HCC) - Primary   Relevant Orders   AMB Referral VBCI Care Management   Lipid panel   Hypothyroid   Relevant Orders   TSH   Vitamin D  deficiency   Relevant Orders   VITAMIN D  25 Hydroxy (Vit-D Deficiency, Fractures)   Other Visit Diagnoses       Dry cough         Statin myopathy [G72.0, T46.6X5A]           Assessment and Plan Assessment & Plan Hyperlipidemia with statin intolerance Intolerance to statins due to muscle pain. Only one statin has been tried. Discussed the possibility of using Repatha (evolocumab) if a second statin is also not tolerated. Emphasized the importance of managing cholesterol levels to prevent heart attack or stroke. - Order fasting lipid panel next week - Have pharmacist Christy contact her to discuss medication options - Consider Repatha (evolocumab) if a second statin is not tolerated  Essential hypertension Hypertension management was discussed in this visit. - Recheck blood pressure  Atherosclerotic cardiovascular disease (aortic and coronary) Focus on managing hyperlipidemia to prevent  heart attack or stroke. Discussed the importance of managing cholesterol levels to prevent further cardiovascular events.  Type 2 diabetes mellitus without complications Without current medication. Previous consideration for medication due to elevated triglycerides. She has made dietary changes to manage blood sugar levels.  Hypothyroidism Managed with levothyroxine  88 mcg daily. - Check thyroid  function next week  Vitamin D  deficiency Not specifically discussed in this visit.  Chronic dry cough Recent exacerbation with no associated shortness of breath. Tessalon  Perles have been effective in the past. -  Prescribe Tessalon  Perles for cough  Chronic foot pain and polyneuropathy Nerve issues extending from the spine. Previous surgical interventions on toes. Pain management with ice and pain cream. Referral to a new neurosurgeon needed due to previous provider relocation. - Refer to a new neurosurgeon for evaluation of spine-related nerve issues     I am having Emsley Z. Glance Pat start on benzonatate . I am also having her maintain her meclizine , carboxymethylcellulose, Ferrous Sulfate (IRON PO), polyethylene glycol, clobetasol ointment, mupirocin ointment, OVER THE COUNTER MEDICATION, neomycin -polymyxin-hydrocortisone, Black Pepper-Turmeric (TURMERIC PLUS BLACK PEPPER EXT PO), diclofenac  Sodium, gabapentin , fluticasone , Vitamin D -Vitamin K (K2-D3 5000 PO), Calmol-4, DULoxetine , clotrimazole , levothyroxine , rOPINIRole , dexamethasone , [name] , (Glycerin, Laxative, (FLEET LIQUID GLYCERIN SUPP RE)), promethazine , oxybutynin , fenofibrate  micronized, bisoprolol -hydrochlorothiazide , tizanidine , traMADol , acyclovir , Trulance , buPROPion , gabapentin , gabapentin , fluconazole , and omeprazole .  Meds ordered this encounter  Medications   benzonatate  (TESSALON ) 200 MG capsule    Sig: Take 1 capsule (200 mg total) by mouth 2 (two) times daily as needed for cough.    Dispense:  20 capsule    Refill:   0    Supervising Provider:   ROLLENE NORRIS A [[id number]]

## 2024-03-18 NOTE — Patient Instructions (Signed)
 Please return 1 morning next week for labs on the first floor of this building, fasting but hydrated.  Nothing to eat or drink except water .  You should hear from our pharmacist regarding your cholesterol medication. You may need to start on Repatha.

## 2024-03-20 ENCOUNTER — Other Ambulatory Visit (HOSPITAL_COMMUNITY): Payer: Self-pay

## 2024-03-22 ENCOUNTER — Ambulatory Visit: Admitting: Physical Therapy

## 2024-03-23 ENCOUNTER — Other Ambulatory Visit (INDEPENDENT_AMBULATORY_CARE_PROVIDER_SITE_OTHER)

## 2024-03-23 DIAGNOSIS — E559 Vitamin D deficiency, unspecified: Secondary | ICD-10-CM

## 2024-03-23 DIAGNOSIS — E119 Type 2 diabetes mellitus without complications: Secondary | ICD-10-CM | POA: Diagnosis not present

## 2024-03-23 DIAGNOSIS — E039 Hypothyroidism, unspecified: Secondary | ICD-10-CM

## 2024-03-23 DIAGNOSIS — I10 Essential (primary) hypertension: Secondary | ICD-10-CM

## 2024-03-23 DIAGNOSIS — E1169 Type 2 diabetes mellitus with other specified complication: Secondary | ICD-10-CM

## 2024-03-23 DIAGNOSIS — I2510 Atherosclerotic heart disease of native coronary artery without angina pectoris: Secondary | ICD-10-CM | POA: Diagnosis not present

## 2024-03-23 DIAGNOSIS — E1122 Type 2 diabetes mellitus with diabetic chronic kidney disease: Secondary | ICD-10-CM

## 2024-03-23 DIAGNOSIS — E785 Hyperlipidemia, unspecified: Secondary | ICD-10-CM

## 2024-03-23 DIAGNOSIS — N182 Chronic kidney disease, stage 2 (mild): Secondary | ICD-10-CM | POA: Diagnosis not present

## 2024-03-23 DIAGNOSIS — I700 Atherosclerosis of aorta: Secondary | ICD-10-CM | POA: Diagnosis not present

## 2024-03-23 LAB — CBC WITH DIFFERENTIAL/PLATELET
Basophils Absolute: 0.1 K/uL (ref 0.0–0.1)
Basophils Relative: 1.3 % (ref 0.0–3.0)
Eosinophils Absolute: 0.2 K/uL (ref 0.0–0.7)
Eosinophils Relative: 3 % (ref 0.0–5.0)
HCT: 42.6 % (ref 36.0–46.0)
Hemoglobin: 14.1 g/dL (ref 12.0–15.0)
Lymphocytes Relative: 23.7 % (ref 12.0–46.0)
Lymphs Abs: 1.4 K/uL (ref 0.7–4.0)
MCHC: 33.1 g/dL (ref 30.0–36.0)
MCV: 92.8 fl (ref 78.0–100.0)
Monocytes Absolute: 0.5 K/uL (ref 0.1–1.0)
Monocytes Relative: 8.6 % (ref 3.0–12.0)
Neutro Abs: 3.7 K/uL (ref 1.4–7.7)
Neutrophils Relative %: 63.4 % (ref 43.0–77.0)
Platelets: 292 K/uL (ref 150.0–400.0)
RBC: 4.59 Mil/uL (ref 3.87–5.11)
RDW: 13.1 % (ref 11.5–15.5)
WBC: 5.8 K/uL (ref 4.0–10.5)

## 2024-03-23 LAB — COMPREHENSIVE METABOLIC PANEL WITH GFR
ALT: 11 U/L (ref 0–35)
AST: 17 U/L (ref 0–37)
Albumin: 4.2 g/dL (ref 3.5–5.2)
Alkaline Phosphatase: 35 U/L — ABNORMAL LOW (ref 39–117)
BUN: 16 mg/dL (ref 6–23)
CO2: 31 meq/L (ref 19–32)
Calcium: 9.3 mg/dL (ref 8.4–10.5)
Chloride: 101 meq/L (ref 96–112)
Creatinine, Ser: 0.88 mg/dL (ref 0.40–1.20)
GFR: 64.25 mL/min (ref >60.00)
Glucose, Bld: 115 mg/dL — ABNORMAL HIGH (ref 70–99)
Potassium: 4 meq/L (ref 3.5–5.1)
Sodium: 141 meq/L (ref 135–145)
Total Bilirubin: 0.5 mg/dL (ref 0.2–1.2)
Total Protein: 6.8 g/dL (ref 6.0–8.3)

## 2024-03-23 LAB — LIPID PANEL
Cholesterol: 198 mg/dL (ref 0–200)
HDL: 32.4 mg/dL — ABNORMAL LOW (ref >39.00)
LDL Cholesterol: 113 mg/dL — ABNORMAL HIGH (ref 0–99)
NonHDL: 165.29
Total CHOL/HDL Ratio: 6
Triglycerides: 261 mg/dL — ABNORMAL HIGH (ref 0.0–149.0)
VLDL: 52.2 mg/dL — ABNORMAL HIGH (ref 0.0–40.0)

## 2024-03-23 LAB — VITAMIN D 25 HYDROXY (VIT D DEFICIENCY, FRACTURES): VITD: 26.76 ng/mL — ABNORMAL LOW (ref 30.00–100.00)

## 2024-03-23 LAB — HEMOGLOBIN A1C: Hgb A1c MFr Bld: 6.8 % — ABNORMAL HIGH (ref 4.6–6.5)

## 2024-03-23 LAB — TSH: TSH: 2.22 u[IU]/mL (ref 0.35–5.50)

## 2024-03-24 ENCOUNTER — Telehealth: Payer: Self-pay | Admitting: *Deleted

## 2024-03-24 ENCOUNTER — Ambulatory Visit: Payer: Self-pay

## 2024-03-24 DIAGNOSIS — M25512 Pain in left shoulder: Secondary | ICD-10-CM | POA: Diagnosis not present

## 2024-03-24 NOTE — Telephone Encounter (Signed)
 This RN made the 2nd attempt to contact patient. No answer left message with call back number.

## 2024-03-24 NOTE — Telephone Encounter (Addendum)
 First attempt; no answer    Message from [facility] C sent at 03/24/2024  3:24 PM EDT  Summary: Patient is requesting antibiotic   Patient called in stating that she may be coming down with something and is asking for an antibiotic to be called in. She has sore throat, ears feel clogged, and coughing. I asked her if her coughs are productive or if she is spitting up anything and she said no and it's not really a lot.  Patient would like a phone call [phone] (H) and also wanted to be scheduled for tomorrow just in case.

## 2024-03-24 NOTE — Telephone Encounter (Signed)
 This RN made the 3rd attempt to contact patient. No answer left message with call back number. Pt already scheduled for an appointment in office tomorrow. Will route to office for follow up.

## 2024-03-24 NOTE — Progress Notes (Signed)
 Care Guide Pharmacy Note  03/24/2024 Name: [name] MRN: [id number] DOB: 10-22-1950  Referred By: Lendia Boby CROME, NP-C Reason for referral: Call Attempt #1 and Complex Care Management (Outreach to schedule referral with pharmacist )   [name] is a 73 y.o. year old female who is a primary care patient of Lendia, Vickie L, NP-C.  Charnese Zuehlsdorff Hopkin was referred to the pharmacist for assistance related to: DMII  An unsuccessful telephone outreach was attempted today to contact the patient who was referred to the pharmacy team for assistance with medication management. Additional attempts will be made to contact the patient.  Thedford Franks, CMA [place]  [facility], [facility] Guide Direct Dial: [phone]  Fax: [phone] Website: [email or web address]

## 2024-03-25 ENCOUNTER — Ambulatory Visit (INDEPENDENT_AMBULATORY_CARE_PROVIDER_SITE_OTHER): Admitting: Family Medicine

## 2024-03-25 VITALS — BP 134/80 | HR 75 | Temp 98.0°F | Ht 63.0 in | Wt 152.8 lb

## 2024-03-25 DIAGNOSIS — B9689 Other specified bacterial agents as the cause of diseases classified elsewhere: Secondary | ICD-10-CM

## 2024-03-25 DIAGNOSIS — J069 Acute upper respiratory infection, unspecified: Secondary | ICD-10-CM

## 2024-03-25 DIAGNOSIS — R051 Acute cough: Secondary | ICD-10-CM

## 2024-03-25 LAB — POCT INFLUENZA A/B
Influenza A, POC: NEGATIVE
Influenza B, POC: NEGATIVE

## 2024-03-25 LAB — POC COVID19 BINAXNOW: SARS Coronavirus 2 Ag: NEGATIVE

## 2024-03-25 MED ORDER — AZITHROMYCIN 250 MG PO TABS: ORAL_TABLET | ORAL | 0 refills | Status: AC

## 2024-03-25 NOTE — Patient Instructions (Signed)
 I have sent in azithromycin  for you to take. Take 2 tablets today, then 1 tablet daily for the next 4 days.  Continue with tessalon  perles.   Follow-up with me for new or worsening symptoms.

## 2024-03-25 NOTE — Progress Notes (Signed)
 Acute Office Visit  Subjective:     Patient ID: Crystal [name], female    DOB: 08-25-1950, 73 y.o.   MRN: [id number]  Chief Complaint  Patient presents with   Acute Visit    Ongoing for about a week. Dry cough, SOB, PND, ear fullness. Has diarrhea but states she also has Chron's     HPI  Discussed the use of AI scribe software for clinical note transcription with the patient, who gave verbal consent to proceed.  History of Present Illness Crystal [name] Pat is a 73 year old female who presents with cough and congestion.  Cough and upper respiratory symptoms - Dry cough progressing to include congestion and a raspy throat - Tessalon  Perles taken last night reduced cough frequency today - Ear symptoms include sensation of being 'in a tunnel' and occasional mild ear pain  Dyspnea - Shortness of breath present  Allergic symptoms and medication tolerance - History of allergies - Previously treated with azithromycin  without significant issues  Fatigue and functional status - Concern about maintaining energy levels, especially with upcoming travel plans to visit daughter and grandchildren     ROS Per HPI      Objective:    BP 134/80 (BP Location: Left Arm, Patient Position: Sitting)   Pulse 75   Temp 98 F (36.7 C) (Temporal)   Ht 5' 3 (1.6 m)   Wt 152 lb 12.8 oz (69.3 kg)   SpO2 94%   BMI 27.07 kg/m    Physical Exam Vitals and nursing note reviewed.  Constitutional:      General: She is not in acute distress.    Appearance: Normal appearance.     Comments: Appears fatigued  HENT:     Head: Normocephalic and atraumatic.     Right Ear: Tympanic membrane and external ear normal.     Left Ear: Tympanic membrane and external ear normal.     Nose: Congestion present.     Mouth/Throat:     Mouth: Mucous membranes are moist.     Comments: Oropharyngeal cobblestoning   Eyes:     Extraocular Movements: Extraocular movements intact.      Pupils: Pupils are equal, round, and reactive to light.  Cardiovascular:     Rate and Rhythm: Normal rate and regular rhythm.     Pulses: Normal pulses.     Heart sounds: Normal heart sounds.  Pulmonary:     Effort: Pulmonary effort is normal. No respiratory distress.     Breath sounds: Normal breath sounds. No wheezing, rhonchi or rales.     Comments: Dry cough Musculoskeletal:        General: Normal range of motion.     Cervical back: Normal range of motion.     Right lower leg: No edema.     Left lower leg: No edema.  Lymphadenopathy:     Cervical: Cervical adenopathy (Mild) present.  Neurological:     General: No focal deficit present.     Mental Status: She is alert and oriented to person, place, and time.  Psychiatric:        Mood and Affect: Mood normal.        Thought Content: Thought content normal.     Results for orders placed or performed in visit on 03/25/24  POC COVID-19 BinaxNow  Result Value Ref Range   SARS Coronavirus 2 Ag Negative Negative  POCT Influenza A/B  Result Value Ref Range   Influenza A, POC Negative Negative  Influenza B, POC Negative Negative        Assessment & Plan:   Assessment and Plan Assessment & Plan Acute bacterial URI with cough Negative COVID and flu tests. History of pneumonia considered. - Prescribed azithromycin . - Continued Tessalon  Perles. - Advised to report if symptoms persist by Monday. - Consider stronger antibiotic if no improvement by Monday.      Orders Placed This Encounter  Procedures   POC COVID-19 BinaxNow   POCT Influenza A/B     Meds ordered this encounter  Medications   azithromycin  (ZITHROMAX ) 250 MG tablet    Sig: Take 2 tablets on day 1, then 1 tablet daily on days 2 through 5    Dispense:  6 tablet    Refill:  0    Return if symptoms worsen or fail to improve.  Corean LITTIE Ku, FNP

## 2024-03-25 NOTE — Progress Notes (Signed)
 Care Guide Pharmacy Note  03/25/2024 Name: Crystal [name] MRN: [id number] DOB: 06-25-1951  Referred By: Lendia Boby CROME, NP-C Reason for referral: Call Attempt #1 and Complex Care Management (Outreach to schedule referral with pharmacist )   Crystal [name] is a 73 y.o. year old female who is a primary care patient of Lendia, Vickie L, NP-C.  Crystal [name] was referred to the pharmacist for assistance related to: DMII  A second unsuccessful telephone outreach was attempted today to contact the patient who was referred to the pharmacy team for assistance with medication management. Additional attempts will be made to contact the patient.  Thedford Franks, CMA [place]  [facility], [facility] Guide Direct Dial: [phone]  Fax: [phone] Website: [email or web address]

## 2024-03-28 ENCOUNTER — Ambulatory Visit: Payer: Self-pay | Admitting: Family Medicine

## 2024-03-28 NOTE — Progress Notes (Signed)
 Her blood sugars over the past 3 months have been higher (A1c now 6.8% (up from 6.2%). Is she ok starting on medication and has she ever been on metformin? Her vitamin D  is still low. I recommend increasing her vitamin D3 by 1,000 IUs daily. She also needs cholesterol medication and I referred her to [facility] for this assistance. No other concerns.

## 2024-03-28 NOTE — Progress Notes (Signed)
 Care Guide Pharmacy Note  03/28/2024 Name: Crystal [name] MRN: [id number] DOB: [birth year]  Referred By: Lendia Boby CROME, NP-C Reason for referral: Call Attempt #1 and Complex Care Management (Outreach to schedule referral with pharmacist )   Crystal [name] is a 72 y.o. year old female who is a primary care patient of Lendia, Vickie L, NP-C.  Crystal [name] was referred to the pharmacist for assistance related to: DMII  A third unsuccessful telephone outreach was attempted today to contact the patient who was referred to the pharmacy team for assistance with medication management. The Population Health team is pleased to engage with this patient at any time in the future upon receipt of referral and should he/she be interested in assistance from the Population Health team.  Thedford Franks, CMA [facility]  [facility], [facility] Guide Direct Dial: [phone]  Fax: [phone] Website: [email or web address]

## 2024-03-31 ENCOUNTER — Other Ambulatory Visit: Payer: Self-pay | Admitting: Family Medicine

## 2024-03-31 MED ORDER — METFORMIN HCL ER 500 MG PO TB24: 500.0000 mg | ORAL_TABLET | Freq: Every day | ORAL | 0 refills | Status: DC

## 2024-04-05 ENCOUNTER — Ambulatory Visit: Payer: Self-pay | Admitting: Primary Care

## 2024-04-05 ENCOUNTER — Emergency Department (HOSPITAL_BASED_OUTPATIENT_CLINIC_OR_DEPARTMENT_OTHER): Admitting: Radiology

## 2024-04-05 ENCOUNTER — Encounter

## 2024-04-05 ENCOUNTER — Encounter (HOSPITAL_BASED_OUTPATIENT_CLINIC_OR_DEPARTMENT_OTHER): Payer: Self-pay | Admitting: Emergency Medicine

## 2024-04-05 ENCOUNTER — Emergency Department (HOSPITAL_BASED_OUTPATIENT_CLINIC_OR_DEPARTMENT_OTHER)
Admission: EM | Admit: 2024-04-05 | Discharge: 2024-04-05 | Disposition: A | Discharge status: Home / Self Care | Attending: Emergency Medicine | Admitting: Emergency Medicine

## 2024-04-05 ENCOUNTER — Other Ambulatory Visit: Payer: Self-pay

## 2024-04-05 DIAGNOSIS — R0789 Other chest pain: Secondary | ICD-10-CM

## 2024-04-05 DIAGNOSIS — W19XXXA Unspecified fall, initial encounter: Secondary | ICD-10-CM

## 2024-04-05 DIAGNOSIS — J189 Pneumonia, unspecified organism: Secondary | ICD-10-CM | POA: Diagnosis not present

## 2024-04-05 DIAGNOSIS — Z043 Encounter for examination and observation following other accident: Secondary | ICD-10-CM | POA: Diagnosis not present

## 2024-04-05 DIAGNOSIS — Z7984 Long term (current) use of oral hypoglycemic drugs: Secondary | ICD-10-CM | POA: Diagnosis not present

## 2024-04-05 DIAGNOSIS — N189 Chronic kidney disease, unspecified: Secondary | ICD-10-CM | POA: Insufficient documentation

## 2024-04-05 DIAGNOSIS — E039 Hypothyroidism, unspecified: Secondary | ICD-10-CM | POA: Insufficient documentation

## 2024-04-05 DIAGNOSIS — R0781 Pleurodynia: Secondary | ICD-10-CM | POA: Insufficient documentation

## 2024-04-05 DIAGNOSIS — J9811 Atelectasis: Secondary | ICD-10-CM | POA: Diagnosis not present

## 2024-04-05 DIAGNOSIS — W182XXA Fall in (into) shower or empty bathtub, initial encounter: Secondary | ICD-10-CM | POA: Insufficient documentation

## 2024-04-05 DIAGNOSIS — I129 Hypertensive chronic kidney disease with stage 1 through stage 4 chronic kidney disease, or unspecified chronic kidney disease: Secondary | ICD-10-CM | POA: Insufficient documentation

## 2024-04-05 DIAGNOSIS — R079 Chest pain, unspecified: Secondary | ICD-10-CM | POA: Diagnosis not present

## 2024-04-05 MED ORDER — OXYCODONE HCL ER 10 MG PO T12A: 10.0000 mg | EXTENDED_RELEASE_TABLET | Freq: Two times a day (BID) | ORAL | 0 refills | Status: DC

## 2024-04-05 MED ORDER — LIDOCAINE 5 % EX PTCH: 1.0000 | MEDICATED_PATCH | CUTANEOUS | 0 refills | Status: AC

## 2024-04-05 NOTE — ED Notes (Signed)
 DC paperwork given and verbally understood.

## 2024-04-05 NOTE — Telephone Encounter (Signed)
 FYI Only or Action Required?: FYI only for provider.  Patient is followed in Pulmonology for bronchiectasis, OSA, last seen on 11/10/2023 by Hope Almarie ORN, NP.  Called Nurse Triage reporting Fall.  Symptoms began 2 days ago.  Symptoms are: unchanged.  Triage Disposition: Go to ED Now (Notify PCP)  Patient/caregiver understands and will follow disposition?: Yes     Copied from CRM [id number]. Topic: Clinical - Red Word Triage >> Apr 05, 2024  1:36 PM Ismael A wrote: Red Word that prompted transfer to Nurse Triage: patient stated she fell yesterday on her left side on top of bathtub and is experiencing pain and having trouble taking deep breaths stated she thinks she may have broken some ribs       Reason for Disposition  Injury (or injuries) that need emergency care  Answer Assessment - Initial Assessment Questions 1. MECHANISM: How did the fall happen?     Fell in bathroom and hit left side on the side of the tub  2. DOMESTIC VIOLENCE AND ELDER ABUSE SCREENING: Did you fall because someone pushed you or tried to hurt you? If Yes, ask: Are you safe now?     No 3. ONSET: When did the fall happen? (e.g., minutes, hours, or days ago)     2 days ago  4. LOCATION: What part of the body hit the ground? (e.g., back, buttocks, head, hips, knees, hands, head, stomach)     Left sided body/ribs  5. INJURY: Did you hurt (injure) yourself when you fell? If Yes, ask: What did you injure? Tell me more about this? (e.g., body area; type of injury; pain severity)     Yes 6. PAIN: Is there any pain? If Yes, ask: How bad is the pain? (e.g., Scale 0-10; or none, mild,      9/10 pain with inspiration 7. SIZE: For cuts, bruises, or swelling, ask: How large is it? (e.g., inches or centimeters)      None that she has seen 9. OTHER SYMPTOMS: Do you have any other symptoms? (e.g., dizziness, fever, weakness; new-onset or worsening).      Difficulty breathing  10. CAUSE:  What do you think caused the fall (or falling)? (e.g., dizzy spell, tripped)       Tripped  Protocols used: Falls and [facility]

## 2024-04-05 NOTE — ED Provider Notes (Signed)
 [place] [facility] AT [facility] Provider Note   CSN: [id number] Arrival date & time: 04/05/24  1503     Patient presents with: Crystal [name] is a 73 y.o. female.  {Add pertinent medical, surgical, social history, OB history to HPI:32947} HPI     73 year old female with a history of anxiety, arthritis, CKD, Crohn's disease, fibromyalgia, hypertension, hyperlipidemia, seizures, restless leg syndrome, CVA, TIAs who presents with concern for left sided rib pain after falling into the side of the tub on Saturday.  Past Medical History:  Diagnosis Date   Allergy  1966   Anemia    Anxiety 2024   Nerve pain   Arthritis 2003   Rhuematologist   ASCVD (arteriosclerotic cardiovascular disease)    Blood transfusion without reported diagnosis    Hospital-surgery   Cataract 2000's   Surgery   Chronic kidney disease 2020's   [facility] long hospital   Closed nondisplaced subtrochanteric fracture of left femur [facility]) 09/03/2017   September 2018.  Status post surgery by Dr. Celena   Crohn's disease of ileum without complication [facility])    DDD (degenerative disc disease), lumbar    Depression 11/27/2010   Son KIA   Dyspnea    Eye infection    Fibromyalgia    Gait disorder 10/26/2012   GERD (gastroesophageal reflux disease) 2009   Gastroenterologist   GI bleed    Glaucoma 2000's   Stable   Hyperlipidemia    Hypertension    Meds   Hypothyroid    Neuromuscular disorder (HCC) 2013   Bells Palsy- right side of face affected   Periprosthetic supracondylar fracture of femur, left  03/23/2017   Pneumonia    Pre-diabetes    Restless leg syndrome    Rhinitis 06/25/2010   Seizures (HCC) ?   TIA   Sleep apnea    does not use CPAP   Spinal headache    with the C section   Stroke (HCC)    Hx TIAs x 3   last was around 2014   TIA (transient ischemic attack)    3         Ulcer    One   Vitamin D  deficiency     Prior to Admission medications    Medication Sig Start Date End Date Taking? Authorizing Provider  acyclovir  (ZOVIRAX ) 400 MG tablet TAKE 1 TABLET BY MOUTH EVERY DAY 11/09/23   Henson, Vickie L, NP-C  benzonatate  (TESSALON ) 200 MG capsule Take 1 capsule (200 mg total) by mouth 2 (two) times daily as needed for cough. 03/18/24   Henson, Vickie L, NP-C  bisoprolol -hydrochlorothiazide  (ZIAC ) 10-6.25 MG tablet Take 1 tablet by mouth daily. 10/29/23   Henson, Vickie L, NP-C  Black Pepper-Turmeric (TURMERIC PLUS BLACK PEPPER EXT PO) Take 1 tablet by mouth daily.    [provider]  buPROPion  (WELLBUTRIN  XL) 300 MG 24 hr tablet Take 1 tablet (300 mg total) by mouth daily for mood, focus & concentration. 01/14/24   Henson, Vickie L, NP-C  carboxymethylcellulose (REFRESH PLUS) 0.5 % SOLN Place 1 drop into both eyes 3 (three) times daily as needed (dry eyes).    [provider]  clobetasol ointment (TEMOVATE) 0.05 % Apply 1 Application topically 3 (three) times daily as needed (lichen planus). 02/10/22   [provider]  clotrimazole  (MYCELEX ) 10 MG troche Dissolve 1 tablet (10 mg total) in mouth 3 (three) times daily. 10/07/22     dexamethasone  (DECADRON ) 0.5 MG/5ML solution Take 5  mLs (0.5 mg total) by mouth 3 (three) times daily. Patient taking differently: Take 0.5 mg by mouth 2 (two) times daily. 03/09/23   Cranford, Tonya, NP  diclofenac  Sodium (VOLTAREN ) 1 % GEL Apply 2 grams topically daily as needed (pain). 10/17/22   Cranford, Tonya, NP  DULoxetine  (CYMBALTA ) 60 MG capsule Take 1 capsule (60 mg total) by mouth daily for chronic pain. 05/18/23   Wilkinson, Dana E, NP  fenofibrate  micronized (LOFIBRA) 134 MG capsule Take 1 capsule (134 mg total) by mouth daily before breakfast 10/29/23   Henson, Vickie L, NP-C  Ferrous Sulfate (IRON PO) Take 25 mg by mouth daily.    [provider]  fluconazole  (DIFLUCAN ) 200 MG tablet Take 1 tablet (200 mg total) by mouth once a week. 02/04/24     fluticasone  (FLONASE ) 50  MCG/ACT nasal spray Use  1-2 Sprays  each nostril   1-2 x /day  as needed 05/05/23   Hope Almarie ORN, NP  gabapentin  (NEURONTIN ) 300 MG capsule Take  1 capsule  3 x /day  or Chronic Pain    (Dx: m79.7)                                               /                                TAKE                                   BY                                  MOUTH 03/05/23   Tonita Fallow, MD  gabapentin  (NEURONTIN ) 600 MG tablet Take 1 tablet (600 mg total) by mouth at bedtime. 01/29/24   Magdalen Pasco RAMAN, DPM  gabapentin  (NEURONTIN ) 800 MG tablet Take 1 tablet (800 mg total) by mouth at bedtime. 01/21/24   Magdalen Pasco RAMAN, DPM  Glycerin, Laxative, (FLEET LIQUID GLYCERIN SUPP RE) Place 1 Dose rectally daily as needed (Constipation).    [provider]  levothyroxine  (SYNTHROID ) 88 MCG tablet Take 1 tablet (88 mcg total) by mouth daily on an empty stomach with water  for 30 minutes.  No antacids, calcium , magnesium  for 4 hours. Avoid biotin. 05/18/23   Wilkinson, Dana E, NP  meclizine  (ANTIVERT ) 25 MG tablet TAKE 1 TABLET (25 MG TOTAL) BY MOUTH 3 (THREE) TIMES DAILY AS NEEDED FOR DIZZINESS. 06/13/18   Tonita Fallow, MD  metFORMIN (GLUCOPHAGE-XR) 500 MG 24 hr tablet Take 1 tablet (500 mg total) by mouth daily with breakfast. 03/31/24   Henson, Vickie L, NP-C  mupirocin ointment (BACTROBAN) 2 % Apply 1 Application topically 3 (three) times daily as needed (lichen planus).    [provider]  neomycin -polymyxin-hydrocortisone (CORTISPORIN) OTIC solution Place 3 drops into both ears daily as needed (ear inflammation).    [provider]  omeprazole  (PRILOSEC) 40 MG capsule Take 1 capsule (40 mg total) by mouth daily. 02/23/24   Henson, Vickie L, NP-C  OVER THE COUNTER MEDICATION Place 1 drop under the tongue daily. D3 5000 units - K2 120 mcg    [provider]  oxybutynin  (DITROPAN ) 5 MG tablet Take 1 tablet 2 x /day fr Excessive Sweating Patient taking differently: daily. Take  1 tablet 2 x /day fr Excessive Sweating 10/29/23   Henson, Vickie L, NP-C  Plecanatide  (TRULANCE ) 3 MG TABS Take 1 tablet (3 mg total) by mouth daily. Lot: [id number], exp: 09-2024 12/09/23   Armbruster, Elspeth SQUIBB, MD  polyethylene glycol (MIRALAX  / GLYCOLAX ) 17 g packet Take 17-51 g by mouth 3 (three) times daily. Take 3 doses daily    [provider]  promethazine  (PHENERGAN ) 25 MG tablet Take 1 tablet (25 mg total) by mouth every 6 (six) hours as needed for nausea or vomiting. 10/21/23   McBane, Aleck SAILOR, PA-C  Rectal Protectant-Emollient (CALMOL-4) 76-10 % SUPP Use as directed once to twice daily 05/13/23   Armbruster, Elspeth SQUIBB, MD  Risankizumab -rzaa ([name] ) 360 MG/2.4ML SOCT Inject 1 Cartridge into the skin every 8 (eight) weeks. 08/18/23   Armbruster, Elspeth SQUIBB, MD  rOPINIRole  (REQUIP ) 2 MG tablet Take 1 tablet (2 mg total) by mouth 2 (two) times daily AND 2 tablets (4 mg total) at bedtime for restless legs 05/29/23   Cranford, Tonya, NP  tizanidine  (ZANAFLEX ) 2 MG capsule Take 1 capsule (2 mg total) by mouth at bedtime for muscle spasm. 10/29/23   Henson, Vickie L, NP-C  traMADol  (ULTRAM ) 50 MG tablet Take 1-2 tablets (50-100 mg total) by mouth every 6 (six) hours as needed (for SEVERE pain ONLY.). NO REFILLS 10/29/23   Henson, Vickie L, NP-C  Vitamin D -Vitamin K (K2-D3 5000 PO) Take 5,000 Units by mouth daily.    [provider]    Allergies: Methocarbamol , Codeine, Statins, Azathioprine, Other, Oxycodone , Amitriptyline, Erythromycin base, Fish oil, Flax seed [flax seed oil], Flaxseed (linseed), Hydrocodone , Morphine, Nsaids, Nuvigil [armodafinil], Sertraline, Sinemet [carbidopa-levodopa], Sulfa antibiotics, and Tolmetin    Review of Systems  Updated Vital Signs BP (!) 114/95 (BP Location: Right Arm)   Pulse 80   Temp 98.6 F (37 C) (Oral)   Resp 18   Ht 5' 3 (1.6 m)   Wt 69.3 kg   SpO2 99%   BMI 27.06 kg/m   Physical Exam  (all labs ordered are listed, but only abnormal  results are displayed) Labs Reviewed - No data to display  EKG: None  Radiology: DG Chest 2 View Result Date: 04/05/2024 CLINICAL DATA:  Patient fell on 04/02/2024. Left anterior chest pain. Previous history of pneumonia. EXAM: CHEST - 2 VIEW COMPARISON:  01/26/2024 FINDINGS: Postoperative changes in the cervical spine and both shoulders. Generator pack over the right chest with leads extending into the right side of the neck. Shallow inspiration with elevation of right hemidiaphragm. Heart size and pulmonary vascularity are normal. Linear atelectasis in the lung bases, greater on the left. No pleural effusion or pneumothorax. Tortuous aorta. Degenerative changes in the spine. IMPRESSION: Shallow inspiration with linear atelectasis in the bases, greater on the left. Electronically Signed   By: Elsie Gravely M.D.   On: 04/05/2024 16:03    {Document cardiac monitor, telemetry assessment procedure when appropriate:32947} Procedures   Medications Ordered in the ED - No data to display    {Click here for ABCD2, HEART and other calculators REFRESH Note before signing:1}                              Medical Decision Making Amount and/or Complexity of Data Reviewed Radiology: ordered.   ***  {Document critical care time  when appropriate  Document review of labs and clinical decision tools ie CHADS2VASC2, etc  Document your independent review of radiology images and any outside records  Document your discussion with family members, caretakers and with consultants  Document social determinants of health affecting pt's care  Document your decision making why or why not admission, treatments were needed:32947:::1}   Final diagnoses:  None    ED Discharge Orders     None

## 2024-04-05 NOTE — Telephone Encounter (Signed)
 Noted, patient going to ED.  Noting further needed.

## 2024-04-05 NOTE — ED Triage Notes (Signed)
 Pt via pov from home after falling from sitting into the side of the tub on Saturday. She states she hit her left ribcage, her chin, and her left knee. She is only concerned with the ribcage, states it is swollen and painful, as well as hard to take a deep breath. Pt a&o x 4; nad noted.

## 2024-04-06 ENCOUNTER — Encounter

## 2024-04-14 ENCOUNTER — Telehealth: Payer: Self-pay

## 2024-04-14 NOTE — Telephone Encounter (Signed)
 Copied from CRM [id number]. Topic: General - Other >> Apr 13, 2024  3:03 PM Russell PARAS wrote: Reason for CRM:   Pt returning Ashley's call. She is unsure what it was regarding, mentioned may be concerning her recent ER visit or sleep study results.   Requested call back  CB#  [phone], leaving out of town tomm at 6 AM so contacting back next week is fine with pt  ATC X1. LMTCB. I do not see any previous note of where I tried to call her recently so I'm not sure what this is about. Pt has a scheduled HST on 04-18-24. NFN

## 2024-04-15 ENCOUNTER — Ambulatory Visit: Admitting: Family Medicine

## 2024-04-18 ENCOUNTER — Encounter

## 2024-04-18 ENCOUNTER — Ambulatory Visit: Payer: Self-pay

## 2024-04-18 NOTE — Telephone Encounter (Signed)
 FYI Only or Action Required?: FYI only for provider.  Patient was last seen in primary care on 03/25/2024 by Alvia Corean CROME, FNP.  Called Nurse Triage reporting Pain.  Symptoms began several days ago.  Interventions attempted: Nothing.  Symptoms are: unchanged.  Triage Disposition: See Physician Within 24 Hours  Patient/caregiver understands and will follow disposition?: Yes  Copied from CRM #[id number]. Topic: Clinical - Red Word Triage >> Apr 18, 2024  3:22 PM Leila BROCKS wrote: Red Word that prompted transfer to Nurse Triage: Patient [phone] states fell on 04/02/24 and went to [facility] ED. Patient states has an appointment with NP, Lendia 04/21/24 and wants to be seen sooner, states has been in a lot pain in left side, breast area and ribs. Patient wants to be seen sooner with NP, Lendia. Informed patient, called the wrong office, this pulmonologist's office. Please advise. Reason for Disposition  [1] MODERATE pain (e.g., interferes with normal activities) AND [2] high-risk adult (e.g., age > 60 years, osteoporosis, chronic steroid use)  Answer Assessment - Initial Assessment Questions Patient called to reschedule for sooner appt. Rescheduled 04/19/24. Advised UC/ED if symptoms worsen.  1. MECHANISM: How did the injury happen?     Seen in UC 04/02/24 [facility]; still having pain. Sitting on toilet and slipped forward, hit left side rib on tub and neck hit tub. 2. ONSET: When did the injury happen? (.e.g., minutes, hours, days ago)     04/02/24 3. LOCATION: Where on the chest is the injury located?     Left side rib pain 4. APPEARANCE: What does the injury look like?     No bruising, cuts, swelling,. Redness under bra 5. BLEEDING: Is there any bleeding now? If Yes, ask: How long has it been bleeding?     no 6. SEVERITY: Any difficulty with breathing?     Moderate; painful. Have not check oxygen  level. 7. SIZE: For cuts, bruises, or swelling, ask: How large is  it? (e.g., inches or centimeters)     denies 8. PAIN: Is there pain? If Yes, ask: How bad is the pain? (e.g., Scale 0-10; none, mild, moderate, severe)     7/10 at rest and severe with movement  Protocols used: Chest Injury-A-AH

## 2024-04-19 ENCOUNTER — Encounter: Payer: Self-pay | Admitting: Family Medicine

## 2024-04-19 ENCOUNTER — Ambulatory Visit: Admitting: Family Medicine

## 2024-04-19 VITALS — BP 114/80 | HR 74 | Temp 98.2°F | Ht 63.0 in | Wt 153.2 lb

## 2024-04-19 DIAGNOSIS — M2550 Pain in unspecified joint: Secondary | ICD-10-CM

## 2024-04-19 DIAGNOSIS — R051 Acute cough: Secondary | ICD-10-CM

## 2024-04-19 DIAGNOSIS — G8929 Other chronic pain: Secondary | ICD-10-CM | POA: Diagnosis not present

## 2024-04-19 DIAGNOSIS — K5000 Crohn's disease of small intestine without complications: Secondary | ICD-10-CM

## 2024-04-19 DIAGNOSIS — R0789 Other chest pain: Secondary | ICD-10-CM

## 2024-04-19 DIAGNOSIS — R1032 Left lower quadrant pain: Secondary | ICD-10-CM

## 2024-04-19 DIAGNOSIS — W1800XA Striking against unspecified object with subsequent fall, initial encounter: Secondary | ICD-10-CM | POA: Diagnosis not present

## 2024-04-19 MED ORDER — BENZONATATE 200 MG PO CAPS: 200.0000 mg | ORAL_CAPSULE | Freq: Two times a day (BID) | ORAL | 0 refills | Status: DC | PRN

## 2024-04-19 NOTE — Progress Notes (Unsigned)
 Acute Office Visit  Subjective:     Patient ID: Crystal [name], female    DOB: 1950/09/05, 73 y.o.   MRN: [id number]  Chief Complaint  Patient presents with   Hospitalization Follow-up    HPI  Discussed the use of AI scribe software for clinical note transcription with the patient, who gave verbal consent to proceed.  History of Present Illness Crystal [name] Pat is a 73 year old female who presents with chest pain and difficulty breathing.  Chest pain and dyspnea - Chest pain worsens with coughing and deep breathing, especially when lying down - Pain partially alleviated by using a pillow for support while lying down - Difficulty breathing present - No visible bruising at the site of pain - Redness, swelling, and hardness under the bra at the site of injury  Recent fall and suspected rib injury - Recent fall with suspected rib injury - No visible bruising  Cough - Dry cough, particularly in the mornings - Cough worsens chest pain - Tessalon  Perles no longer available for cough management  Pain management and medication allergies - Lidocaine  cream used for pain relief due to insurance issues with other medications - Hydrocodone  allergy  present - Difficulty finding effective pain management due to medication allergies and sensitivities  Pulmonary hygiene - Incentive spirometer used despite pain to prevent lung collapse  History of pneumonia - History of pneumonia following surgery, initially missed by urgent care  Musculoskeletal symptoms and functional status - Reverse ball shoulder surgery completed - Physical therapy discontinued after fall due to difficulty lifting weights - Worsening arthritis symptoms, particularly in the hands     ROS Per HPI      Objective:    BP 114/80 (BP Location: Left Arm, Patient Position: Sitting)   Pulse 74   Temp 98.2 F (36.8 C) (Temporal)   Ht 5' 3 (1.6 m)   Wt 153 lb 3.2 oz (69.5 kg)    SpO2 97%   BMI 27.14 kg/m    Physical Exam Vitals and nursing note reviewed.  Constitutional:      General: She is not in acute distress.    Appearance: Normal appearance. She is normal weight.  HENT:     Head: Normocephalic and atraumatic.     Right Ear: External ear normal.     Left Ear: External ear normal.     Nose: Nose normal.     Mouth/Throat:     Mouth: Mucous membranes are moist.     Pharynx: Oropharynx is clear.  Eyes:     Extraocular Movements: Extraocular movements intact.     Pupils: Pupils are equal, round, and reactive to light.  Cardiovascular:     Rate and Rhythm: Normal rate and regular rhythm.     Pulses: Normal pulses.     Heart sounds: Normal heart sounds.  Pulmonary:     Effort: Pulmonary effort is normal. No respiratory distress.     Breath sounds: Normal breath sounds. No wheezing, rhonchi or rales.  Musculoskeletal:        General: Normal range of motion.     Cervical back: Normal range of motion.     Right lower leg: No edema.     Left lower leg: No edema.  Lymphadenopathy:     Cervical: No cervical adenopathy.  Neurological:     General: No focal deficit present.     Mental Status: She is alert and oriented to person, place, and time.  Psychiatric:  Mood and Affect: Mood normal.        Thought Content: Thought content normal.     No results found for any visits on 04/19/24.      Assessment & Plan:   Assessment and Plan Assessment & Plan Rib and chest wall pain after fall Persistent pain with difficulty breathing, likely soft tissue injury or rib fracture. - Order chest CT to evaluate for injury or fracture. - Recommend lidocaine  patches for pain. - Advise pillow support while lying down.  Left shoulder pain and weakness after reverse shoulder arthroplasty Pain and weakness post-arthroplasty, exacerbated by fall, limited strength. - Advise range of motion exercises without weights. - Recommend resuming physical therapy after  rib and chest wall pain subsides.  Osteoarthritis Severe osteoarthritis with ineffective or intolerable current treatments. - Refer to [facility] in [facility] for further evaluation.  Chronic cough Chronic dry cough, previously managed with Tessalon  Perles. - Refill Tessalon  Perles.     Orders Placed This Encounter  Procedures   CT Chest Wo Contrast    Standing Status:   Future    Expiration Date:   04/19/2025    Preferred imaging location?:   [facility]   CT ABDOMEN PELVIS WO CONTRAST    Standing Status:   Future    Expiration Date:   04/19/2025    Preferred imaging location?:   [facility]    If indicated for the ordered procedure, I authorize the administration of oral contrast media per Radiology protocol:   Yes    Does the patient have a contrast media/X-ray dye allergy ?:   No     Meds ordered this encounter  Medications   benzonatate  (TESSALON ) 200 MG capsule    Sig: Take 1 capsule (200 mg total) by mouth 2 (two) times daily as needed for cough.    Dispense:  20 capsule    Refill:  0    Return if symptoms worsen or fail to improve.  Corean LITTIE Ku, FNP

## 2024-04-19 NOTE — Patient Instructions (Addendum)
 I have refilled your tessalon  perles.   I would have you pick up lidocaine  patches over the counter at your pharmacy.   I have ordered CTs to further evaluate your ribs and pain.   Referral sent over to [facility] for further evaluation.   Follow-up with me for new or worsening symptoms.

## 2024-04-20 ENCOUNTER — Other Ambulatory Visit: Payer: Self-pay | Admitting: Family Medicine

## 2024-04-20 DIAGNOSIS — R61 Generalized hyperhidrosis: Secondary | ICD-10-CM

## 2024-04-21 ENCOUNTER — Ambulatory Visit: Admitting: Family Medicine

## 2024-04-22 ENCOUNTER — Ambulatory Visit
Admission: RE | Admit: 2024-04-22 | Discharge: 2024-04-22 | Disposition: A | Discharge status: Home / Self Care | Source: Ambulatory Visit | Attending: Family Medicine | Admitting: Family Medicine

## 2024-04-22 ENCOUNTER — Encounter: Payer: Self-pay | Admitting: Family Medicine

## 2024-04-22 ENCOUNTER — Ambulatory Visit: Payer: Self-pay | Admitting: Family Medicine

## 2024-04-22 ENCOUNTER — Other Ambulatory Visit: Payer: Self-pay | Admitting: Family Medicine

## 2024-04-22 DIAGNOSIS — S2242XA Multiple fractures of ribs, left side, initial encounter for closed fracture: Secondary | ICD-10-CM | POA: Diagnosis not present

## 2024-04-22 DIAGNOSIS — R051 Acute cough: Secondary | ICD-10-CM

## 2024-04-22 DIAGNOSIS — R1032 Left lower quadrant pain: Secondary | ICD-10-CM

## 2024-04-22 DIAGNOSIS — K5000 Crohn's disease of small intestine without complications: Secondary | ICD-10-CM

## 2024-04-22 DIAGNOSIS — R0789 Other chest pain: Secondary | ICD-10-CM

## 2024-04-22 DIAGNOSIS — R1012 Left upper quadrant pain: Secondary | ICD-10-CM | POA: Diagnosis not present

## 2024-04-22 DIAGNOSIS — G8929 Other chronic pain: Secondary | ICD-10-CM

## 2024-04-22 DIAGNOSIS — W1800XA Striking against unspecified object with subsequent fall, initial encounter: Secondary | ICD-10-CM

## 2024-04-27 ENCOUNTER — Ambulatory Visit: Payer: Self-pay | Admitting: Primary Care

## 2024-04-27 NOTE — Telephone Encounter (Signed)
 FYI Only or Action Required?: Action required by provider: update on patient condition.  Patient is followed in Pulmonology for OSA, last seen on 11/10/2023 by Hope Almarie ORN, NP.  Called Nurse Triage reporting Shortness of Breath.  Symptoms began chronic but intermittent per patient.  Interventions attempted: Nothing.  Symptoms are: stable.  Triage Disposition: See PCP Within 2 Weeks  Patient/caregiver understands and will follow disposition?: No, wishes to speak with PCP  E2C2 Pulmonary Triage - Initial Assessment Questions "Chief Complaint (e.g., cough, sob, wheezing, fever, chills, sweat or additional symptoms) *Go to specific symptom protocol after initial questions. Patient calling with concerns about her Inspire machine. Patient is concerned that a report she read showed that there were two leads for her Elizabeth. Explained to patient that there are two leads to the [place]. Patient still has questions. Patient recommended to speak with [facility] and notify office that inserted the [place]. Patient endorses some shortness of breath at times but currently denying any shortness of breath, chest pain or wheezing. Patient states symptoms from her fall are getting better. Patient reports no acute need at this time. Patient states she will call [employer] and will call the office that placed the device. Patient is given strict ED precautions if symptoms change.   "How long have symptoms been present?" No current symptoms.   Have you tested for COVID or Flu? Note: If not, ask patient if a home test can be taken. If so, instruct patient to call back for positive results. No  MEDICINES:   "Have you used any OTC meds to help with symptoms?" No If yes, ask "What medications?"   "Have you used your inhalers/maintenance medication?" No If yes, "What medications?"   If inhaler, ask "How many puffs and how often?" Note: Review instructions on medication in the  chart.   OXYGEN : "Do you wear supplemental oxygen ?" No If yes, "How many liters are you supposed to use?"   "Do you monitor your oxygen  levels?" Yes If yes, What is your reading (oxygen  level) today? 91%  What is your usual oxygen  saturation reading?  (Note: Pulmonary O2 sats should be 90% or greater) 98%   Copied from CRM #[id number]. Topic: Clinical - Red Word Triage >> Apr 27, 2024  4:29 PM Leila BROCKS wrote: Red Word that prompted transfer to Nurse Triage: Patient [phone] states has an Elizabeth, patient fell April 02, 2024 while out of town and went to [facility] ER 04/05/24 and saw FNP, Donnice and had CT chest scan showing 4 broken ribs. Patient states Inspire implanted above right breast to the right neck to the tongue, a wire is off. Patient wants to know if the wire should be there? Patient states the tongue seems stronger and activate.   Patient sent a Mychart message: Chronic right chest generator device with electrical lead extending into the right neck. The generator device seems to have a second electrical lead which terminates in the right internal mammary region and is unchanged. Is the second lead supposed to be there? I was not informed about it and concern is that in my fall and hitting my neck did the lead break loose or has it been there the whole time?  Patient states having discomfort and swelling, it's going down. Patient is concerned with the Inspire. Patient is having shortness of breath is off an on, oxygen  level dropping 85-95. Please advise, patient sees NP, Hope. Reason for Disposition  [1] MILD longstanding difficulty breathing (e.g., minimal/no SOB at rest,  SOB with walking, pulse < 100) AND [2] SAME as normal  Answer Assessment - Initial Assessment Questions 6. CARDIAC HISTORY: Do you have any history of heart disease? (e.g., heart attack, angina, bypass surgery, angioplasty)      no 7. LUNG HISTORY: Do you have any history of lung disease?  (e.g.,  pulmonary embolus, asthma, emphysema)     yes 12. TRAVEL: Have you traveled out of the country in the last month? (e.g., travel history, exposures)       no  Protocols used: Breathing Difficulty-A-AH

## 2024-04-27 NOTE — Telephone Encounter (Signed)
 She can try Inspire but they will probably only be able to answer technical questions about the device. I would have her reach out to Dr. [place] office, Dr. Carlie dose Inspire implants as well and can answer her question.

## 2024-04-27 NOTE — Telephone Encounter (Signed)
 duplicate

## 2024-04-28 ENCOUNTER — Ambulatory Visit

## 2024-04-28 NOTE — Telephone Encounter (Signed)
 Noted

## 2024-05-01 ENCOUNTER — Other Ambulatory Visit: Payer: Self-pay | Admitting: Family Medicine

## 2024-05-01 DIAGNOSIS — Z79899 Other long term (current) drug therapy: Secondary | ICD-10-CM

## 2024-05-04 ENCOUNTER — Encounter: Payer: Self-pay | Admitting: Gastroenterology

## 2024-05-04 ENCOUNTER — Ambulatory Visit (INDEPENDENT_AMBULATORY_CARE_PROVIDER_SITE_OTHER): Admitting: Gastroenterology

## 2024-05-04 ENCOUNTER — Other Ambulatory Visit: Payer: Self-pay | Admitting: Gastroenterology

## 2024-05-04 VITALS — BP 120/68 | HR 85 | Ht 63.0 in | Wt 151.0 lb

## 2024-05-04 DIAGNOSIS — K648 Other hemorrhoids: Secondary | ICD-10-CM

## 2024-05-04 DIAGNOSIS — K50018 Crohn's disease of small intestine with other complication: Secondary | ICD-10-CM

## 2024-05-04 DIAGNOSIS — Z79899 Other long term (current) drug therapy: Secondary | ICD-10-CM | POA: Diagnosis not present

## 2024-05-04 NOTE — Progress Notes (Signed)
 HPI :  73 year old female here for a follow up visit for Crohn's disease of the ileum.  Recall she also has history of lichen planus followed by dermatology at [facility], history of arthritis followed by rheumatology   She was previously followed by Dr. Luis for Crohn's disease, we have assumed her care in recent years   Crohn's history Dx Crohn's ileitis back in 2019 or so, however she has had ileitis noted on colonoscopies dating back for years before that however biopsies never confirmed the diagnosis. She was on Humira for a few years after her initial diagnosis.  From review of the record it was a question of she was having multiple falls due to demyelination?  However it was continued despite that.  She had multiple orthopedic surgeries in 2021 2022, she had an infection from a knee replacement, had been off Humira for several months and then ultimately just stopped it. She has never had a surgery for Crohn's disease or hospitalization from this. Started [name]  2024   SINCE LAST VISIT   Patient here for a follow-up visit for her Crohn's disease.  She is been on [name]  since 2024.  Colonoscopy December of last year showed some mild inflammation in her ileum with inflammatory polyps.  Fecal calprotectin had gone from 1000 to 500s on [name]  but had not normalized.  She had her dose escalated to 360 mg this past year, we repeated a fecal calprotectin in June which remained in the 500s.  MR enterography was subsequently obtained which showed acute on chronic inflammation of her small bowel, consistent with active Crohn's.  She subsequently had a CT scan for another issue within the past 2 weeks which shows Crohn's of her small intestine.  She has not had any major issues with her Crohn's since she has last seen me.  She does have altered bowel habits, sometimes having loose stools, sometimes having constipation.  She takes MiraLAX  as needed if she is constipated.  Her hemorrhoids can be inflamed  and are bothering her at times, she inquires what she can take for that.  She denies any abdominal pains.  Eating okay.  She has issues with chronic neuropathy and vertigo, and recurrent dental infections as well  I did speak with Dr. Hulda, her dermatologist at [facility] about her situation since our last visit.  Difficult case, her lichen planus is not well-controlled.  Ideally they had wanted to start her on Rinvoq, however she has a history of 2 TIAs in the past.  I was concerned about her cardiovascular risks.  She also reports a history of CAD.  Recall she has had neurologic side effects from Humira, thus have held off on putting her on Remicade.  With active disease on [name] , unclear if other drugs in that class would benefit her.  She has never been on Entyvio however unclear how much that would help if any, like an planus.  Her vaccinations are up-to-date for pneumococcal, influenza, shingles, RSV etc.  Her blood work in September looked pretty normal.  Prior workup:   Colonoscopy 2019 - finding of chronic active ileitis with ulceration and a tubular adenoma.    Review of her medical chart, found earlier ulcerative inflammation of the terminal ileum, at colonoscopy 2010 and 2014. Biospies confirmed active inflammation, but granulomas have not been found. Small bowel series confirmed ileal inflammation, consistent with Crohn's enteritis of the terminal ileum.    MRE 07/2014: IMPRESSION:   1. Long segment of bowel wall thickening, mucosal  enhancement and luminal narrowing involving 20 cm of the distal ileum. No evidence of bowel obstruction or proximal dilatation. No fecalization. The inflammation is present on comparison exam but the narrowing has progressed. 2. No fisutala or abscess.     Echocardiogram 02/28/2022 - EF 55-60%,    CT enterography 07/30/22: IMPRESSION: 1. Persistent long segment distal and terminal ileal wall thickening, consistent with the clinical history of Crohn  disease. The hyperenhancing terminal ileal component is similar and the extent of mucosal hyperenhancement within the distal most ileum is decreased compared to 08/25/2014 MR enterography. Upstream dilatation is new/increased, consistent with a component of obstruction/stenosis. 2. No other complication and no new sites of active inflammation identified. 3.  Aortic Atherosclerosis (ICD10-I70.0).     Fecal calprotectin 07/15/22 - 1060     Colonoscopy 06/11/23: - The perianal and digital rectal examinations were normal. - The terminal ileum contained two semi-pedunculated polyps. They both appeared inflammatory with erosions. Biopsies were taken with a cold forceps for histology. - Patchy mild inflammation characterized by a few small erosions was found in the terminal ileum. - A 3 mm polyp was found in the ileocecal valve. The polyp was flat. The polyp was removed with a cold snare. Resection and retrieval were complete. - A 6 mm polyp was found in the ascending colon. The polyp was sessile. The polyp was removed with a cold snare. Resection and retrieval were complete. - Multiple small-mouthed diverticula were found in the transverse colon and left colon. - Internal hemorrhoids were found during retroflexion. - The exam was otherwise without abnormality.   FINAL DIAGNOSIS        1. Surgical [P], small bowel, ileal :       - SEVERELY ACTIVE CHRONIC ILEITIS WITH EROSION, CONSISTENT PRESENTLY HISTORY OF       CROHN'S DISEASE       - NEGATIVE FOR GRANULOMAS OR DYSPLASIA        2. Surgical [P], colon, ascending and ileocecal valve, polyp (2) :       - TUBULAR ADENOMA(S)       - NEGATIVE FOR HIGH-GRADE DYSPLASIA OR MALIGNANCY     Fecal calprotectin 07/30/23: 559    Fecal calprotectin 12/15/23: 503   MRE 12/14/23: IMPRESSION: 1. There are 2 segments of acute on chronic inflammation involving the distal ileum, as described in detail above. There is no evidence of fistula or  abscess/collection. Overall, findings favor acute on chronic inflammatory bowel disease-Crohn's disease. When compared to the prior CT scan from 07/30/2022, the degree of active inflammation is less pronounced. 2. Multiple other nonacute observations, as described above  [name] -can you help coordinate an office visit for this patient with me in 3 to 4 months?  For Crohn's disease     CT C/A/P 04/22/24: IMPRESSION: 1. Left 6th through 9th rib fractures with no complicating features. 2. No other acute traumatic injury identified in the noncontrast chest, abdomen, or pelvis 3. Chronic terminal ileum inflammatory bowel disease. Chronic spine degeneration and postoperative changes.  Past Medical History:  Diagnosis Date   Allergy  1966   Anemia    Anxiety 2024   Nerve pain   Arthritis 2003   Rhuematologist   ASCVD (arteriosclerotic cardiovascular disease)    Blood transfusion without reported diagnosis    Hospital-surgery   Cataract 2000's   Surgery   Chronic kidney disease 2020's   [facility]   Closed nondisplaced subtrochanteric fracture of left femur [facility]) 09/03/2017   September 2018.  Status post surgery by Dr. Celena   Crohn's disease of ileum without complication [facility])    DDD (degenerative disc disease), lumbar    Depression 11/27/2010   Son KIA   Dyspnea    Eye infection    Fibromyalgia    Gait disorder 10/26/2012   GERD (gastroesophageal reflux disease) 2009   Gastroenterologist   GI bleed    Glaucoma 2000's   Stable   Hyperlipidemia    Hypertension    Meds   Hypothyroid    Neuromuscular disorder (HCC) 2013   Bells Palsy- right side of face affected   Periprosthetic supracondylar fracture of femur, left  03/23/2017   Pneumonia    Pre-diabetes    Restless leg syndrome    Rhinitis 06/25/2010   Seizures (HCC) ?   TIA   Sleep apnea    does not use CPAP   Spinal headache    with the C section   Stroke (HCC)    Hx TIAs x 3   last was around 2014    TIA (transient ischemic attack)    3         Ulcer    One   Vitamin D  deficiency      Past Surgical History:  Procedure Laterality Date   ABDOMINAL HYSTERECTOMY  1992   ANTERIOR CERVICAL DECOMPRESSION/DISCECTOMY FUSION 4 LEVELS N/A 06/14/2015   Procedure: Cervical three-four Cervical four-five Cervical five- six Cervical six- seven  Anterior cervical decompression/diskectomy/fusion;  Surgeon: Fairy Levels, MD;  Location: [facility];  Service: Neurosurgery;  Laterality: N/A;  C3-4 C4-5 C5-6 C6-7 Anterior cervical decompression/diskectomy/fusion   APPENDECTOMY  1960   BACK SURGERY     surgeries x 3   CATARACT EXTRACTION, BILATERAL     CESAREAN SECTION  1983 & 1886   DRUG INDUCED ENDOSCOPY N/A 02/25/2022   Procedure: DRUG INDUCED SLEEP ENDOSCOPY;  Surgeon: Mable Lenis, MD;  Location: [facility];  Service: ENT;  Laterality: N/A;   EYE SURGERY     bilateral cataracts   FINGER ARTHROSCOPY     FRACTURE SURGERY  2020   Femur   IMPLANTATION OF HYPOGLOSSAL NERVE STIMULATOR Right 05/29/2022   Procedure: IMPLANTATION OF HYPOGLOSSAL NERVE STIMULATOR;  Surgeon: Mable Lenis, MD;  Location[facility];  Service: ENT;  Laterality: Right;   JOINT REPLACEMENT     2   LEFT  1 RIGHT    KNEE SURGERY     ORIF FEMUR FRACTURE Left 03/24/2017   Procedure: OPEN REDUCTION INTERNAL FIXATION (ORIF) FEMUR FRACTURE;  Surgeon: Celena Sharper, MD;  Location: [facility] OR;  Service: Orthopedics;  Laterality: Left;   REVERSE SHOULDER ARTHROPLASTY Left 10/21/2023   Procedure: ARTHROPLASTY, SHOULDER, TOTAL, REVERSE;  Surgeon: Cristy Bonner DASEN, MD;  Location: [facility];  Service: Orthopedics;  Laterality: Left;   ROTATOR CUFF REPAIR Left 2005   2 LEFT  1  RIGHT   SHOULDER ADHESION RELEASE     SPINAL CORD DECOMPRESSION  1998   SPINE SURGERY     TOE SURGERY     LEFT        TOE SURGERY     RIGHT  BIG TOE   TONSILLECTOMY  1960   TRANSFORAMINAL LUMBAR INTERBODY FUSION W/ MIS 1 LEVEL Left 09/05/2021    Procedure: MINIMALLY INVASIVE TRANSFORAMINAL LUMBAR INTERBODY FUSION LUMBAR THREE-FOUR; EXPLORE FUSION WITH REVISION OF HARDWARE LUMBAR FOUR-FIVE,LEFT SIDE APPROACH;  Surgeon: Carollee Lani BROCKS, DO;  Location: [facility] OR;  Service: Neurosurgery;  Laterality: Left;   Family History  Problem  Relation Age of Onset   Cancer Mother    Diabetes Mother    Osteoporosis Mother    Breast cancer Mother    Arthritis Mother    Miscarriages / Stillbirths Mother    Obesity Mother    Heart disease Father    Hypertension Father    Hyperlipidemia Father    Neuropathy Father    Stroke Father    Leukemia Sister    Lupus Sister    Early death Sister    Miscarriages / Stillbirths Sister    Depression Sister    Rheum arthritis Sister    Cancer Sister    Miscarriages / Stillbirths Sister    Migraines Daughter    Cancer Daughter    Anxiety disorder Daughter    Cancer Son    Early death Son    Liver disease Neg Hx    Colon cancer Neg Hx    Esophageal cancer Neg Hx    Social History   Tobacco Use   Smoking status: Never    Passive exposure: Past   Smokeless tobacco: Never  Vaping Use   Vaping status: Never Used  Substance Use Topics   Alcohol use: Not Currently    Alcohol/week: 4.0 standard drinks of alcohol    Types: 2 Cans of beer, 2 Standard drinks or equivalent per week    Comment: On and off   Drug use: No   Current Outpatient Medications  Medication Sig Dispense Refill   acyclovir  (ZOVIRAX ) 400 MG tablet TAKE 1 TABLET BY MOUTH EVERY DAY 90 tablet 1   benzonatate  (TESSALON ) 200 MG capsule Take 1 capsule (200 mg total) by mouth 2 (two) times daily as needed for cough. 20 capsule 0   bisoprolol -hydrochlorothiazide  (ZIAC ) 10-6.25 MG tablet Take 1 tablet by mouth daily. 90 tablet 2   Black Pepper-Turmeric (TURMERIC PLUS BLACK PEPPER EXT PO) Take 1 tablet by mouth daily.     buPROPion  (WELLBUTRIN  XL) 300 MG 24 hr tablet Take 1 tablet (300 mg total) by mouth daily for mood, focus & concentration.  90 tablet 0   carboxymethylcellulose (REFRESH PLUS) 0.5 % SOLN Place 1 drop into both eyes 3 (three) times daily as needed (dry eyes).     clobetasol ointment (TEMOVATE) 0.05 % Apply 1 Application topically 3 (three) times daily as needed (lichen planus).     clotrimazole  (MYCELEX ) 10 MG troche Dissolve 1 tablet (10 mg total) in mouth 3 (three) times daily. 140 Troche 11   diclofenac  Sodium (VOLTAREN ) 1 % GEL Apply 2 grams topically daily as needed (pain). 100 g 1   DULoxetine  (CYMBALTA ) 60 MG capsule Take 1 capsule (60 mg total) by mouth daily for chronic pain. 90 capsule 3   fenofibrate  micronized (LOFIBRA) 134 MG capsule Take 1 capsule (134 mg total) by mouth daily before breakfast 90 capsule 3   Ferrous Sulfate (IRON PO) Take 25 mg by mouth daily.     fluticasone  (FLONASE ) 50 MCG/ACT nasal spray Use  1-2 Sprays  each nostril   1-2 x /day  as needed 16 g 5   gabapentin  (NEURONTIN ) 300 MG capsule Take  1 capsule  3 x /day  or Chronic Pain    (Dx: m79.7)                                               /  TAKE                                   BY                                  MOUTH     gabapentin  (NEURONTIN ) 600 MG tablet Take 1 tablet (600 mg total) by mouth at bedtime. 30 tablet 5   Glycerin, Laxative, (FLEET LIQUID GLYCERIN SUPP RE) Place 1 Dose rectally daily as needed (Constipation).     levothyroxine  (SYNTHROID ) 88 MCG tablet Take 1 tablet (88 mcg total) by mouth daily on an empty stomach with water  for 30 minutes.  No antacids, calcium , magnesium  for 4 hours. Avoid biotin. 90 tablet 3   lidocaine  (LIDODERM ) 5 % Place 1 patch onto the skin daily. Remove & Discard patch within 12 hours or as directed by MD 30 patch 0   meclizine  (ANTIVERT ) 25 MG tablet TAKE 1 TABLET (25 MG TOTAL) BY MOUTH 3 (THREE) TIMES DAILY AS NEEDED FOR DIZZINESS. 90 tablet 0   metFORMIN (GLUCOPHAGE-XR) 500 MG 24 hr tablet Take 1 tablet (500 mg total) by mouth daily with breakfast. 90 tablet 0    mupirocin ointment (BACTROBAN) 2 % Apply 1 Application topically 3 (three) times daily as needed (lichen planus).     neomycin -polymyxin-hydrocortisone (CORTISPORIN) OTIC solution Place 3 drops into both ears daily as needed (ear inflammation).     omeprazole  (PRILOSEC) 40 MG capsule Take 1 capsule (40 mg total) by mouth daily. 90 capsule 1   OVER THE COUNTER MEDICATION Place 1 drop under the tongue daily. D3 5000 units - K2 120 mcg     oxybutynin  (DITROPAN ) 5 MG tablet TAKE 1 TABLET 2 X /DAY FR EXCESSIVE SWEATING 180 tablet 1   polyethylene glycol (MIRALAX  / GLYCOLAX ) 17 g packet Take 17-51 g by mouth 3 (three) times daily. Take 3 doses daily     Rectal Protectant-Emollient (CALMOL-4) 76-10 % SUPP Use as directed once to twice daily     Risankizumab -rzaa ([name] ) 360 MG/2.4ML SOCT Inject 1 Cartridge into the skin every 8 (eight) weeks. 2.4 mL 5   rOPINIRole  (REQUIP ) 2 MG tablet Take 1 tablet (2 mg total) by mouth 2 (two) times daily AND 2 tablets (4 mg total) at bedtime for restless legs 360 tablet 3   tizanidine  (ZANAFLEX ) 2 MG capsule Take 1 capsule (2 mg total) by mouth at bedtime for muscle spasm. 90 capsule 3   traMADol  (ULTRAM ) 50 MG tablet Take 1-2 tablets (50-100 mg total) by mouth every 6 (six) hours as needed (for SEVERE pain ONLY.). NO REFILLS 30 tablet 0   Vitamin D -Vitamin K (K2-D3 5000 PO) Take 5,000 Units by mouth daily.     dexamethasone  (DECADRON ) 0.5 MG/5ML solution Take 5 mLs (0.5 mg total) by mouth 3 (three) times daily. (Patient not taking: Reported on 05/04/2024) 100 mL 1   fluconazole  (DIFLUCAN ) 200 MG tablet Take 1 tablet (200 mg total) by mouth once a week. (Patient not taking: Reported on 05/04/2024) 26 tablet 0   Plecanatide  (TRULANCE ) 3 MG TABS Take 1 tablet (3 mg total) by mouth daily. Lot: [id number], exp: 09-2024 (Patient not taking: Reported on 05/04/2024) 3 tablet 0   No current facility-administered medications for this visit.   Allergies  Allergen Reactions    Methocarbamol  Shortness Of Breath, Nausea Only and  Other (See Comments)    Other reaction(s): Dizziness, psychological reaction, disoriented Sleepy and weight loss    Codeine Other (See Comments)    Headache   Statins Other (See Comments)    Extreme pain   Azathioprine    Other Other (See Comments)    MINT=sore mouth and stomach upset   Oxycodone      Dizziness, unbalanced.   Amitriptyline Other (See Comments)    Confusion and hallucinations   Erythromycin Base Rash    Flushing/red face.   Fish Oil Nausea Only   Flax Seed [Flax Seed Oil] Rash   Flaxseed (Linseed) Rash   Hydrocodone  Itching and Rash        Morphine Nausea And Vomiting and Rash   Nsaids Nausea Only   Nuvigil [Armodafinil] Other (See Comments)    Unspecified   Sertraline Rash   Sinemet [Carbidopa-Levodopa] Rash   Sulfa Antibiotics Rash   Tolmetin Nausea Only     Review of Systems: All systems reviewed and negative except where noted in HPI.    CT CHEST ABDOMEN PELVIS WO CONTRAST Result Date: 04/22/2024 EXAM: CT CHEST, ABDOMEN AND PELVIS WITHOUT CONTRAST 04/22/2024 10:49:06 AM TECHNIQUE: CT of the chest, abdomen and pelvis was performed without the administration of intravenous contrast. Multiplanar reformatted images are provided for review. Automated exposure control, iterative reconstruction, and/or weight based adjustment of the mA/kV was utilized to reduce the radiation dose to as low as reasonably achievable. COMPARISON: CTA chest 10/23/2023. CT 07/30/2022. CLINICAL HISTORY: 73 year old female. Chest trauma, blunt; left rib pain after fall. S/p fall off toilet 04/02/2024; Rib and LUQ Pain on left side; Sx- hyster, appy, back; Hx of ckd. Crohn disease. FINDINGS: CHEST: MEDIASTINUM AND LYMPH NODES: Normal heart size. No pericardial effusion. The central airways are clear. No mediastinal, hilar or axillary lymphadenopathy. LUNGS AND PLEURA: Improved lung volumes and bilateral ventilation compared to 10/23/2023.  Scattered mild lung atelectasis and or scarring. No focal consolidation or pulmonary edema. No pleural effusion or pneumothorax. ABDOMEN AND PELVIS: LIVER: The liver is unremarkable. GALLBLADDER AND BILE DUCTS: Gallbladder is unremarkable. No biliary ductal dilatation. SPLEEN: No acute abnormality. PANCREAS: No acute abnormality. ADRENAL GLANDS: No acute abnormality. KIDNEYS, URETERS AND BLADDER: No stones in the kidneys or ureters. No hydronephrosis. No perinephric or periureteral stranding. Urinary bladder is unremarkable. GI AND BOWEL: Decompressed stomach. There is no bowel obstruction. Chronic diverticulosis of the descending and sigmoid colon. Mild large bowel retained stool. Chronic terminal ileum wall thickening compatible with chronic inflammatory bowel disease. Diminutive or absent appendix. REPRODUCTIVE ORGANS: Chronically absent uterus, diminutive or absent ovaries. PERITONEUM AND RETROPERITONEUM: No ascites. No free air. No pneumoperitoneum. No mesenteric inflammation identified. VASCULATURE: Aorta is normal in caliber. Aortoiliac calcified atherosclerosis. ABDOMINAL AND PELVIS LYMPH NODES: No lymphadenopathy. BONES AND SOFT TISSUES: Partially visible lower cervical spine degeneration and acdf. Degenerative appearing ankylosis at the cervicothoracic junction. Mildly transitional thoracic and lumbosacral anatomy, hypoplastic ribs or unfused ossification centers at L1. Transitional appearance of the lumbosacral junction. Chronic L3 through S1 prior decompression and fusion. Stable thoracic vertebral height. Chronic bilateral shoulder arthroplasty with streak artifact. Chronic right chest generator device with electrical lead extending into the right neck. The generator device seems to have a second electrical lead which terminates in the right internal mammary region and is unchanged. Mildly displaced left anterior 6th rib fracture. Comminuted left anterior 7th rib fracture. Mildly displaced left lateral  8th rib fracture. Probable nondisplaced left lateral 9th rib fracture. No superficial soft tissue injury. IMPRESSION:  1. Left 6th through 9th rib fractures with no complicating features. 2. No other acute traumatic injury identified in the noncontrast chest, abdomen, or pelvis 3. Chronic terminal ileum inflammatory bowel disease. Chronic spine degeneration and postoperative changes. Electronically signed by: Helayne Hurst MD 04/22/2024 11:14 AM EDT RP Workstation: [id number]   DG Chest 2 View Result Date: 04/05/2024 CLINICAL DATA:  Patient fell on 04/02/2024. Left anterior chest pain. Previous history of pneumonia. EXAM: CHEST - 2 VIEW COMPARISON:  01/26/2024 FINDINGS: Postoperative changes in the cervical spine and both shoulders. Generator pack over the right chest with leads extending into the right side of the neck. Shallow inspiration with elevation of right hemidiaphragm. Heart size and pulmonary vascularity are normal. Linear atelectasis in the lung bases, greater on the left. No pleural effusion or pneumothorax. Tortuous aorta. Degenerative changes in the spine. IMPRESSION: Shallow inspiration with linear atelectasis in the bases, greater on the left. Electronically Signed   By: Elsie Gravely M.D.   On: 04/05/2024 16:03    Physical Exam: BP 120/68   Pulse 85   Ht 5' 3 (1.6 m)   Wt 151 lb (68.5 kg)   BMI 26.75 kg/m  Constitutional: Pleasant,well-developed, female in no acute distress. Neurological: Alert and oriented to person place and time. Psychiatric: Normal mood and affect. Behavior is normal.  Lab Results  Component Value Date   WBC 5.8 03/23/2024   HGB 14.1 03/23/2024   HCT 42.6 03/23/2024   MCV 92.8 03/23/2024   PLT 292.0 03/23/2024    Lab Results  Component Value Date   NA 141 03/23/2024   CL 101 03/23/2024   K 4.0 03/23/2024   CO2 31 03/23/2024   BUN 16 03/23/2024   CREATININE 0.88 03/23/2024   GFR 64.25 03/23/2024   CALCIUM  9.3 03/23/2024   ALBUMIN  4.2  03/23/2024   GLUCOSE 115 (H) 03/23/2024    Lab Results  Component Value Date   ALT 11 03/23/2024   AST 17 03/23/2024   ALKPHOS 35 (L) 03/23/2024   BILITOT 0.5 03/23/2024     ASSESSMENT: 73 y.o. female here for assessment of the following  1. Crohn's disease of small intestine with other complication (HCC)   2. High risk medication use   3. Other hemorrhoids    I had a good discussion with the patient today about her Crohn's disease and her options for therapy moving forward.  Recall remotely she was on Humira and had questionable demyelinating/neurologic issues on this and had a had to be stopped.  I am hesitant to use Remicade in this light.  Unfortunately on [name]  her Crohn's is active with fecal calprotectin, MR E, and CT scan all showing active disease.  Her symptoms are mainly altered bowel habits at this time, but assuming her active Crohn's is causing this.  Likewise, her lichen planus appears poorly controlled.  I have spoken with her dermatologist at the last visit about options.  Ideally from the dermatologist perspective, Rinvoq could really help that particular issue and certainly her Crohn's disease.  The problem with that is it can significantly increase the risk for cardiovascular events such as heart attack and stroke in patients with risk factors.  The patient has already had 2 TIAs in the past which is a relative contraindication to the use of Rinvoq.  In this light I would recommend against it.  We discussed other options.  We could use Entyvio for her Crohn's disease which is a very safe drug and could help her in  this situation.  Issue is I do not think it would help much with her lichen planus but could ask her dermatologist.  She could use Entyvio for her Crohn's disease however and use a separate regimen for her lichen planus pending her options from the dermatology perspective.  Otherwise, the other option from my perspective would be to switch her to another  regimen in the similar class of [name]  to include Tremfya, Omvoh, etc.  I do not know if that would help her lichen planus and in general if the patient failed [name]  I would think the likelihood of benefit from that regimen may be on the lower side.  I will need to discuss her case with the dermatologist and get back to her.  From my perspective I think Entyvio would be her best, safest option for her Crohn's disease, the question would be what else could she go on with it for the lichen planus (patient mention CellCept?)  Finally, with her altered bowel habits she has been having some hemorrhoidal symptoms.  Will give her some samples of Calmol 4 suppositories to use as needed for inflammation.  Would hope to avoid intervention such as banding in light of her Crohn's disease but if symptoms persist we may consider that.  PLAN - need to change regimen for her Crohn's disease as above.  I will speak with her dermatologist and then get back to the patient regarding options.  Will need to seek prior authorization from insurance if switching regimen. - vaccines UTD - Calmol 4 suppositories for hemorrhoids - follow-up in 3 to 4 months after starting new regimen.  Marcey Naval, MD [facility]   ADDENDUM: I spoke with Dr. Hulda of Dermatology this afternoon. Discussed options. 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 No reports of Entyvio helping this condition but given it's oral involvement, possible it could. Will see if Entyvio is covered by insurance and if so, will start it and stop [name] . She will discuss further with the patient tomorrow.

## 2024-05-04 NOTE — Patient Instructions (Addendum)
 We have given you samples of the following medication to take: Calmol 4 suppositories Take as directed as needed.  When you meet with Dr. Demetria, please relay that we are not in favor of Rinvoq but would recommend Entyvio.  Please follow up in three to four months.  Thank you for entrusting me with your care and for choosing [facility], Dr. Elspeth Naval   _______________________________________________________  If your blood pressure at your visit was 140/90 or greater, please contact your primary care physician to follow up on this.  _______________________________________________________  If you are age 75 or older, your body mass index should be between 23-30. Your Body mass index is 26.75 kg/m. If this is out of the aforementioned range listed, please consider follow up with your Primary Care Provider.  If you are age 60 or younger, your body mass index should be between 19-25. Your Body mass index is 26.75 kg/m. If this is out of the aformentioned range listed, please consider follow up with your Primary Care Provider.   ________________________________________________________  The [facility] providers would like to encourage you to use MYCHART to communicate with providers for non-urgent requests or questions.  Due to long hold times on the telephone, sending your provider a message by [facility] may be a faster and more efficient way to get a response.  Please allow 48 business hours for a response.  Please remember that this is for non-urgent requests.  _______________________________________________________  [facility] is using a team-based approach to care.  Your team is made up of your doctor and two to three APPS. Our APPS (Nurse Practitioners and Physician Assistants) work with your physician to ensure care continuity for you. They are fully qualified to address your health concerns and develop a treatment plan. They communicate directly with your  gastroenterologist to care for you. Seeing the Advanced Practice Practitioners on your physician's team can help you by facilitating care more promptly, often allowing for earlier appointments, access to diagnostic testing, procedures, and other specialty referrals.

## 2024-05-05 ENCOUNTER — Telehealth: Payer: Self-pay | Admitting: Pharmacy Technician

## 2024-05-05 DIAGNOSIS — R29810 Facial weakness: Secondary | ICD-10-CM | POA: Diagnosis not present

## 2024-05-05 DIAGNOSIS — W19XXXA Unspecified fall, initial encounter: Secondary | ICD-10-CM | POA: Diagnosis not present

## 2024-05-05 DIAGNOSIS — S8001XA Contusion of right knee, initial encounter: Secondary | ICD-10-CM | POA: Diagnosis not present

## 2024-05-05 DIAGNOSIS — E119 Type 2 diabetes mellitus without complications: Secondary | ICD-10-CM | POA: Diagnosis not present

## 2024-05-05 DIAGNOSIS — B370 Candidal stomatitis: Secondary | ICD-10-CM | POA: Diagnosis not present

## 2024-05-05 DIAGNOSIS — Z7952 Long term (current) use of systemic steroids: Secondary | ICD-10-CM | POA: Diagnosis not present

## 2024-05-05 DIAGNOSIS — M85841 Other specified disorders of bone density and structure, right hand: Secondary | ICD-10-CM | POA: Diagnosis not present

## 2024-05-05 DIAGNOSIS — S0993XA Unspecified injury of face, initial encounter: Secondary | ICD-10-CM | POA: Diagnosis not present

## 2024-05-05 DIAGNOSIS — L438 Other lichen planus: Secondary | ICD-10-CM | POA: Diagnosis not present

## 2024-05-05 DIAGNOSIS — Z79890 Hormone replacement therapy (postmenopausal): Secondary | ICD-10-CM | POA: Diagnosis not present

## 2024-05-05 DIAGNOSIS — Z79899 Other long term (current) drug therapy: Secondary | ICD-10-CM | POA: Diagnosis not present

## 2024-05-05 DIAGNOSIS — N3000 Acute cystitis without hematuria: Secondary | ICD-10-CM | POA: Diagnosis not present

## 2024-05-05 DIAGNOSIS — M7989 Other specified soft tissue disorders: Secondary | ICD-10-CM | POA: Diagnosis not present

## 2024-05-05 DIAGNOSIS — S60221A Contusion of right hand, initial encounter: Secondary | ICD-10-CM | POA: Diagnosis not present

## 2024-05-05 DIAGNOSIS — M19041 Primary osteoarthritis, right hand: Secondary | ICD-10-CM | POA: Diagnosis not present

## 2024-05-05 DIAGNOSIS — M79641 Pain in right hand: Secondary | ICD-10-CM | POA: Diagnosis not present

## 2024-05-05 DIAGNOSIS — M25561 Pain in right knee: Secondary | ICD-10-CM | POA: Diagnosis not present

## 2024-05-05 DIAGNOSIS — S6991XA Unspecified injury of right wrist, hand and finger(s), initial encounter: Secondary | ICD-10-CM | POA: Diagnosis not present

## 2024-05-05 DIAGNOSIS — R079 Chest pain, unspecified: Secondary | ICD-10-CM | POA: Diagnosis not present

## 2024-05-05 DIAGNOSIS — S0990XA Unspecified injury of head, initial encounter: Secondary | ICD-10-CM | POA: Diagnosis not present

## 2024-05-05 NOTE — Telephone Encounter (Addendum)
 Auth Submission: APPROVED Site of care: Site of care: [facility] Payer: [employer] ADVT - Medication & CPT/J Code(s) submitted: Entyvio (Vedolizumab) [id number] Diagnosis Code: K50.00 Route of submission (phone, fax, portal):  Phone # Fax # Auth type: Buy/Bill PB Units/visits requested: 300MG  DAY0, DAY14, DAY42, THEN Q8WKS Reference number: [id number] Approval from: 05/05/24 to 08/05/24   Secondary: [employer] [place] Approved: 05/11/24 - 05/11/25 Auth: [id number]

## 2024-05-10 ENCOUNTER — Encounter: Payer: Self-pay | Admitting: Family Medicine

## 2024-05-10 ENCOUNTER — Telehealth: Payer: Self-pay | Admitting: Gastroenterology

## 2024-05-10 ENCOUNTER — Ambulatory Visit (INDEPENDENT_AMBULATORY_CARE_PROVIDER_SITE_OTHER): Admitting: Family Medicine

## 2024-05-10 VITALS — BP 108/74 | HR 68 | Temp 97.6°F | Ht 63.0 in

## 2024-05-10 DIAGNOSIS — W19XXXA Unspecified fall, initial encounter: Secondary | ICD-10-CM | POA: Diagnosis not present

## 2024-05-10 DIAGNOSIS — R2689 Other abnormalities of gait and mobility: Secondary | ICD-10-CM | POA: Diagnosis not present

## 2024-05-10 DIAGNOSIS — E1122 Type 2 diabetes mellitus with diabetic chronic kidney disease: Secondary | ICD-10-CM

## 2024-05-10 DIAGNOSIS — E1169 Type 2 diabetes mellitus with other specified complication: Secondary | ICD-10-CM | POA: Diagnosis not present

## 2024-05-10 DIAGNOSIS — R42 Dizziness and giddiness: Secondary | ICD-10-CM

## 2024-05-10 DIAGNOSIS — N3000 Acute cystitis without hematuria: Secondary | ICD-10-CM | POA: Diagnosis not present

## 2024-05-10 DIAGNOSIS — E785 Hyperlipidemia, unspecified: Secondary | ICD-10-CM

## 2024-05-10 DIAGNOSIS — M5090 Cervical disc disorder, unspecified, unspecified cervical region: Secondary | ICD-10-CM

## 2024-05-10 DIAGNOSIS — Z7984 Long term (current) use of oral hypoglycemic drugs: Secondary | ICD-10-CM | POA: Diagnosis not present

## 2024-05-10 DIAGNOSIS — E559 Vitamin D deficiency, unspecified: Secondary | ICD-10-CM | POA: Diagnosis not present

## 2024-05-10 NOTE — Telephone Encounter (Signed)
 Left message for pt to call back

## 2024-05-10 NOTE — Patient Instructions (Signed)
High Triglycerides Eating Plan ?Triglycerides are a type of fat in the blood. High levels of triglycerides can increase your risk of heart disease and stroke. If your triglyceride levels are high, choosing the right foods can help lower your triglycerides and keep your heart healthy. Work with your health care provider or a dietitian to develop an eating plan that is right for you. ?What are tips for following this plan? ?General guidelines ? ?Lose weight, if you are overweight. For most people, losing 5-10 lb (2-5 kg) helps lower triglyceride levels. A weight-loss plan may include: ?30 minutes of exercise at least 5 days a week. ?Reducing the amount of calories, sugar, and fat you eat. ?Eat a wide variety of fresh fruits, vegetables, and whole grains. These foods are high in fiber. ?Eat foods that contain healthy fats, such as fatty fish, nuts, seeds, and olive oil. ?Avoid foods that are high in added sugar, added salt (sodium), and saturated fat. ?Avoid low-fiber, refined carbohydrates such as white bread, crackers, noodles, and white rice. ?Avoid foods with trans fats or partially hydrogenated oils, such as fried foods or stick margarine. ?If you drink alcohol: ?Limit how much you have to: ?0-1 drink a day for women who are not pregnant. ?0-2 drinks a day for men. ?Your health care provider may recommend that you drink less than these amounts depending on your overall health. ?Know how much alcohol is in a drink. In the [place] one drink equals one 12 oz bottle of beer (355 mL), one 5 oz glass of wine (148 mL), or one 1? oz glass of hard liquor (44 mL). ?Reading food labels ?Check food labels for: ?The amount of saturated fat. Choose foods with no or very little saturated fat (less than 2 g). ?The amount of trans fat. Choose foods with no transfat. ?The amount of cholesterol. Choose foods that are low in cholesterol. ?The amount of sodium. Choose foods with less than 140 milligrams (mg) per serving. ?Shopping ?Buy  dairy products labeled as nonfat (skim) or low-fat (1%). ?Avoid buying processed or prepackaged foods. These are often high in added sugar, sodium, and fat. ?Cooking ?Choose healthy fats when cooking, such as olive oil, avocado oil, or canola oil. ?Cook foods using lower fat methods, such as baking, broiling, boiling, or grilling. ?Make your own sauces, dressings, and marinades when possible, instead of buying them. Store-bought sauces, dressings, and marinades are often high in sodium and sugar. ?Meal planning ?Eat more home-cooked food and less restaurant, buffet, and fast food. ?Eat fatty fish at least 2 times each week. Examples of fatty fish include salmon, trout, sardines, mackerel, tuna, and herring. ?If you eat whole eggs, do not eat more than 4 egg yolks per week. ?What foods should I eat? ?Fruits ?All fresh, canned (in natural juice), or frozen fruits. ?Vegetables ?Fresh or frozen vegetables. Low-sodium canned vegetables. ?Grains ?Whole wheat or whole grain breads, crackers, cereals, and pasta. Unsweetened oatmeal. Bulgur. Barley. Quinoa. Brown rice. Whole wheat flour tortillas. ?Meats and other proteins ?Skinless chicken or turkey. Ground chicken or turkey. Lean cuts of pork, trimmed of fat. Fish and seafood, especially salmon, trout, and herring. Egg whites. Dried beans, peas, or lentils. Unsalted nuts or seeds. Unsalted canned beans. Natural peanut or almond butter or other nut butters. ?Dairy ?Low-fat dairy products. Skim or low-fat (1%) milk. Reduced fat (2%) and low-sodium cheese. Low-fat ricotta cheese. Low-fat cottage cheese. Plain, low-fat yogurt. ?Fats and oils ?Tub margarine without trans fats. Light or reduced-fat mayonnaise. Light or   reduced-fat salad dressings. Avocado. Safflower, olive, sunflower, soybean, and canola oils. The items listed above may not be a complete list of recommended foods and beverages. Talk with your dietitian about what dietary choices are best for you. What foods  should I avoid? Fruits Sweetened dried fruit. Canned fruit in syrup. Fruit juice. Vegetables Creamed or fried vegetables. Vegetables in a cheese sauce. Grains White bread. White (regular) pasta. White rice. Cornbread. Bagels. Pastries. Crackers that contain trans fat. Meats and other proteins Fatty cuts of meat. Ribs. Chicken wings. Tomasa Blase. Sausage. Bologna. Salami. Chitterlings. Fatback. Hot dogs. Bratwurst. Packaged lunch meats. Dairy Whole or reduced-fat (2%) milk. Half-and-half. Cream cheese. Full-fat or sweetened yogurt. Full-fat cheese. Nondairy creamers. Whipped toppings. Processed cheese or cheese spreads. Cheese curds. Fats and oils Butter. Stick margarine. Lard. Shortening. Ghee. Bacon fat. Tropical oils, such as coconut, palm kernel, or palm oils. Beverages Alcohol. Sweetened drinks, such as soda, lemonade, fruit drinks, or punches. Sweets and desserts Corn syrup. Sugars. Honey. Molasses. Candy. Jam and jelly. Syrup. Sweetened cereals. Cookies. Pies. Cakes. Donuts. Muffins. Ice cream. Condiments Store-bought sauces, dressings, and marinades that are high in sugar, such as ketchup and barbecue sauce. The items listed above may not be a complete list of foods and beverages you should avoid. Talk with your dietitian about what dietary choices are best for you. Summary High levels of triglycerides can increase the risk of heart disease and stroke. Choosing the right foods can help lower your triglycerides. Eat plenty of fresh fruits, vegetables, and whole grains. Choose low-fat dairy and lean meats. Eat fatty fish at least twice a week. Avoid processed and prepackaged foods with added sugar, sodium, saturated fat, and trans fat. If you need suggestions or have questions about what types of food are good for you, talk with your health care provider or a dietitian. This information is not intended to replace advice given to you by your health care provider. Make sure you discuss any  questions you have with your health care provider. Document Revised: 10/26/2020 Document Reviewed: 10/26/2020 Elsevier Patient Education  2024 [employer].

## 2024-05-10 NOTE — Telephone Encounter (Signed)
 Patient requesting to speak with a nurse in regards to iron infusion. Please advise.

## 2024-05-10 NOTE — Progress Notes (Unsigned)
 Subjective:     Patient ID: Crystal [name], female    DOB: 1951-03-10, 73 y.o.   MRN: [id number]  Chief Complaint  Patient presents with   Hospitalization Follow-up    D/c 11/6 from [facility] for fall    HPI  Discussed the use of AI scribe software for clinical note transcription with the patient, who gave verbal consent to proceed.  History of Present Illness [name] Pat is a 73 year old female who presents for follow-up after a recent fall and urinary tract infection.  Recent fall and musculoskeletal injuries - Fall occurred on May 05, 2024 - Small effusion present in the right knee joint - Right thumb remains painful and ecchymotic ('really black and blue') - History of multiple falls and two prior right knee surgeries - Persistent right knee discomfort and crepitus  Urinary symptoms and acute cystitis - Diagnosed with acute cystitis during recent emergency department visit - Symptoms included itching and warm, dark yellow urine - Currently on day three of a seven-day course of Keflex  - Urinary frequency attributed to smaller bladder  Diabetes mellitus and metabolic health - Diabetes managed with metformin and dietary modifications - Hemoglobin A1c was 6.8 in September - Focused on weight loss to improve glycemic control and reduce antihypertensive medication - Hypertriglyceridemia with triglycerides measured at 261 - Currently taking cholesterol medication  Peripheral neuropathy and lower extremity symptoms - Neuropathy present in the right leg, worsens if medication is taken later in the day - History of ingrown toenail surgery - Circulatory problems in the feet causing difficulty with ambulation - Uses shoes that elevate toes to assist with walking - Concerned about foot health due to diabetes  Cervical spine disease and balance disturbance - History of cervical disc disease with four discs replaced - Balance impairment  attributed to cervical spine disease - Vertigo, especially when lying down or rising quickly - Discontinued balance physical therapy in August  Cognitive health concerns - Family history of dementia or Alzheimer's disease (mother developed in late eighties) - Concerned about cognitive health and interested in preventive strategies      Health Maintenance Due  Topic Date Due   Medicare Annual Wellness (AWV)  08/20/2023   Diabetic kidney evaluation - Urine ACR  05/17/2024    Past Medical History:  Diagnosis Date   Allergy  1966   Anemia    Anxiety 2024   Nerve pain   Arthritis 2003   Rhuematologist   ASCVD (arteriosclerotic cardiovascular disease)    Blood transfusion without reported diagnosis    Hospital-surgery   Cataract 2000's   Surgery   Chronic kidney disease 2020's   [facility] hospital   Closed nondisplaced subtrochanteric fracture of left femur (HCC) 09/03/2017   September 2018.  Status post surgery by Dr. Celena   Crohn's disease of ileum without complication [facility])    DDD (degenerative disc disease), lumbar    Depression 11/27/2010   Son KIA   Dyspnea    Eye infection    Fibromyalgia    Gait disorder 10/26/2012   GERD (gastroesophageal reflux disease) 2009   Gastroenterologist   GI bleed    Glaucoma 2000's   Stable   Hyperlipidemia    Hypertension    Meds   Hypothyroid    Neuromuscular disorder (HCC) 2013   Bells Palsy- right side of face affected   Periprosthetic supracondylar fracture of femur, left  03/23/2017   Pneumonia    Pre-diabetes    Restless leg syndrome  Rhinitis 06/25/2010   Seizures (HCC) ?   TIA   Sleep apnea    does not use CPAP   Spinal headache    with the C section   Stroke (HCC)    Hx TIAs x 3   last was around 2014   TIA (transient ischemic attack)    3         Ulcer    One   Vitamin D  deficiency     Past Surgical History:  Procedure Laterality Date   ABDOMINAL HYSTERECTOMY  1992   ANTERIOR CERVICAL  DECOMPRESSION/DISCECTOMY FUSION 4 LEVELS N/A 06/14/2015   Procedure: Cervical three-four Cervical four-five Cervical five- six Cervical six- seven  Anterior cervical decompression/diskectomy/fusion;  Surgeon: Fairy Levels, MD;  Location: [facility];  Service: Neurosurgery;  Laterality: N/A;  C3-4 C4-5 C5-6 C6-7 Anterior cervical decompression/diskectomy/fusion   APPENDECTOMY  1960   BACK SURGERY     surgeries x 3   CATARACT EXTRACTION, BILATERAL     CESAREAN SECTION  1983 & 1886   DRUG INDUCED ENDOSCOPY N/A 02/25/2022   Procedure: DRUG INDUCED SLEEP ENDOSCOPY;  Surgeon: Mable Lenis, MD;  Location: [facility];  Service: ENT;  Laterality: N/A;   EYE SURGERY     bilateral cataracts   FINGER ARTHROSCOPY     FRACTURE SURGERY  2020   Femur   IMPLANTATION OF HYPOGLOSSAL NERVE STIMULATOR Right 05/29/2022   Procedure: IMPLANTATION OF HYPOGLOSSAL NERVE STIMULATOR;  Surgeon: Mable Lenis, MD;  Location: [facility];  Service: ENT;  Laterality: Right;   JOINT REPLACEMENT     2   LEFT  1 RIGHT    KNEE SURGERY     ORIF FEMUR FRACTURE Left 03/24/2017   Procedure: OPEN REDUCTION INTERNAL FIXATION (ORIF) FEMUR FRACTURE;  Surgeon: Celena Sharper, MD;  Location: [facility] OR;  Service: Orthopedics;  Laterality: Left;   REVERSE SHOULDER ARTHROPLASTY Left 10/21/2023   Procedure: ARTHROPLASTY, SHOULDER, TOTAL, REVERSE;  Surgeon: Cristy Bonner DASEN, MD;  Location: [facility];  Service: Orthopedics;  Laterality: Left;   ROTATOR CUFF REPAIR Left 2005   2 LEFT  1  RIGHT   SHOULDER ADHESION RELEASE     SPINAL CORD DECOMPRESSION  1998   SPINE SURGERY     TOE SURGERY     LEFT        TOE SURGERY     RIGHT  BIG TOE   TONSILLECTOMY  1960   TRANSFORAMINAL LUMBAR INTERBODY FUSION W/ MIS 1 LEVEL Left 09/05/2021   Procedure: MINIMALLY INVASIVE TRANSFORAMINAL LUMBAR INTERBODY FUSION LUMBAR THREE-FOUR; EXPLORE FUSION WITH REVISION OF HARDWARE LUMBAR FOUR-FIVE,LEFT SIDE APPROACH;  Surgeon: Carollee Lani BROCKS, DO;   Location: [facility] OR;  Service: Neurosurgery;  Laterality: Left;    Family History  Problem Relation Age of Onset   Cancer Mother    Diabetes Mother    Osteoporosis Mother    Breast cancer Mother    Arthritis Mother    Miscarriages / Stillbirths Mother    Obesity Mother    Heart disease Father    Hypertension Father    Hyperlipidemia Father    Neuropathy Father    Stroke Father    Leukemia Sister    Lupus Sister    Early death Sister    Miscarriages / Stillbirths Sister    Depression Sister    Rheum arthritis Sister    Cancer Sister    Miscarriages / Stillbirths Sister    Migraines Daughter    Cancer Daughter    Anxiety disorder  Daughter    Cancer Son    Early death Son    Liver disease Neg Hx    Colon cancer Neg Hx    Esophageal cancer Neg Hx     Social History   Socioeconomic History   Marital status: Married    Spouse name: Not on file   Number of children: 2   Years of education: Not on file   Highest education level: Bachelor's degree (e.g., BA, AB, BS)  Occupational History   Occupation: Retired Advertising Account Executive: DISABLED  Tobacco Use   Smoking status: Never    Passive exposure: Past   Smokeless tobacco: Never  Vaping Use   Vaping status: Never Used  Substance and Sexual Activity   Alcohol use: Not Currently    Alcohol/week: 4.0 standard drinks of alcohol    Types: 2 Cans of beer, 2 Standard drinks or equivalent per week    Comment: On and off   Drug use: No   Sexual activity: Not Currently    Birth control/protection: None    Comment: Hysterectomy  Other Topics Concern   Not on file  Social History Narrative   Drinks about 1 cup of coffee a day, occasional coke zero    Social Drivers of Corporate Investment Banker Strain: Low Risk  (04/22/2024)   Overall Financial Resource Strain (CARDIA)    Difficulty of Paying Living Expenses: Not very hard  Food Insecurity: No Food Insecurity (04/22/2024)   Hunger Vital Sign    Worried About  Running Out of Food in the Last Year: Never true    Ran Out of Food in the Last Year: Never true  Transportation Needs: Unmet Transportation Needs (04/22/2024)   PRAPARE - Administrator, Civil Service (Medical): Yes    Lack of Transportation (Non-Medical): No  Physical Activity: Inactive (04/22/2024)   Exercise Vital Sign    Days of Exercise per Week: 0 days    Minutes of Exercise per Session: Not on file  Stress: No Stress Concern Present (04/22/2024)   [employer] of Occupational Health - Occupational Stress Questionnaire    Feeling of Stress: Only a little  Social Connections: Moderately Integrated (04/22/2024)   Social Connection and Isolation Panel    Frequency of Communication with Friends and Family: More than three times a week    Frequency of Social Gatherings with Friends and Family: Once a week    Attends Religious Services: Patient declined    Database Administrator or Organizations: Yes    Attends Engineer, Structural: More than 4 times per year    Marital Status: Married  Recent Concern: Social Connections - Moderately Isolated (03/25/2024)   Social Connection and Isolation Panel    Frequency of Communication with Friends and Family: More than three times a week    Frequency of Social Gatherings with Friends and Family: Twice a week    Attends Religious Services: Patient declined    Database Administrator or Organizations: Yes    Attends Engineer, Structural: More than 4 times per year    Marital Status: Widowed  Intimate Partner Violence: Not At Risk (05/05/2024)   Received from [facility]   Humiliation, Afraid, Rape, and Kick questionnaire    Within the last year, have you been afraid of your partner or ex-partner?: No    Within the last year, have you been humiliated or emotionally abused in other ways by your partner or  ex-partner?: No    Within the last year, have you been kicked, hit, slapped, or otherwise physically hurt by  your partner or ex-partner?: No    Within the last year, have you been raped or forced to have any kind of sexual activity by your partner or ex-partner?: No    Outpatient Medications Prior to Visit  Medication Sig Dispense Refill   acyclovir  (ZOVIRAX ) 400 MG tablet TAKE 1 TABLET BY MOUTH EVERY DAY 90 tablet 1   benzonatate  (TESSALON ) 200 MG capsule Take 1 capsule (200 mg total) by mouth 2 (two) times daily as needed for cough. 20 capsule 0   bisoprolol -hydrochlorothiazide  (ZIAC ) 10-6.25 MG tablet Take 1 tablet by mouth daily. 90 tablet 2   Black Pepper-Turmeric (TURMERIC PLUS BLACK PEPPER EXT PO) Take 1 tablet by mouth daily.     buPROPion  (WELLBUTRIN  XL) 300 MG 24 hr tablet Take 1 tablet (300 mg total) by mouth daily for mood, focus & concentration. 90 tablet 0   carboxymethylcellulose (REFRESH PLUS) 0.5 % SOLN Place 1 drop into both eyes 3 (three) times daily as needed (dry eyes).     clobetasol ointment (TEMOVATE) 0.05 % Apply 1 Application topically 3 (three) times daily as needed (lichen planus).     clotrimazole  (MYCELEX ) 10 MG troche Dissolve 1 tablet (10 mg total) in mouth 3 (three) times daily. 140 Troche 11   diclofenac  Sodium (VOLTAREN ) 1 % GEL Apply 2 grams topically daily as needed (pain). 100 g 1   DULoxetine  (CYMBALTA ) 60 MG capsule Take 1 capsule (60 mg total) by mouth daily for chronic pain. 90 capsule 3   fenofibrate  micronized (LOFIBRA) 134 MG capsule Take 1 capsule (134 mg total) by mouth daily before breakfast 90 capsule 3   Ferrous Sulfate (IRON PO) Take 25 mg by mouth daily.     fluticasone  (FLONASE ) 50 MCG/ACT nasal spray Use  1-2 Sprays  each nostril   1-2 x /day  as needed 16 g 5   gabapentin  (NEURONTIN ) 300 MG capsule Take  1 capsule  3 x /day  or Chronic Pain    (Dx: m79.7)                                               /                                TAKE                                   BY                                  MOUTH     gabapentin  (NEURONTIN ) 600 MG  tablet Take 1 tablet (600 mg total) by mouth at bedtime. 30 tablet 5   Glycerin, Laxative, (FLEET LIQUID GLYCERIN SUPP RE) Place 1 Dose rectally daily as needed (Constipation).     levothyroxine  (SYNTHROID ) 88 MCG tablet Take 1 tablet (88 mcg total) by mouth daily on an empty stomach with water  for 30 minutes.  No antacids, calcium , magnesium  for 4 hours. Avoid biotin. 90 tablet 3   lidocaine  (LIDODERM ) 5 % Place 1 patch  onto the skin daily. Remove & Discard patch within 12 hours or as directed by MD 30 patch 0   meclizine  (ANTIVERT ) 25 MG tablet TAKE 1 TABLET (25 MG TOTAL) BY MOUTH 3 (THREE) TIMES DAILY AS NEEDED FOR DIZZINESS. 90 tablet 0   metFORMIN (GLUCOPHAGE-XR) 500 MG 24 hr tablet Take 1 tablet (500 mg total) by mouth daily with breakfast. 90 tablet 0   mupirocin ointment (BACTROBAN) 2 % Apply 1 Application topically 3 (three) times daily as needed (lichen planus).     neomycin -polymyxin-hydrocortisone (CORTISPORIN) OTIC solution Place 3 drops into both ears daily as needed (ear inflammation).     omeprazole  (PRILOSEC) 40 MG capsule Take 1 capsule (40 mg total) by mouth daily. 90 capsule 1   OVER THE COUNTER MEDICATION Place 1 drop under the tongue daily. D3 5000 units - K2 120 mcg     oxybutynin  (DITROPAN ) 5 MG tablet TAKE 1 TABLET 2 X /DAY FR EXCESSIVE SWEATING 180 tablet 1   polyethylene glycol (MIRALAX  / GLYCOLAX ) 17 g packet Take 17-51 g by mouth 3 (three) times daily. Take 3 doses daily     Rectal Protectant-Emollient (CALMOL-4) 76-10 % SUPP Use as directed once to twice daily     Risankizumab -rzaa ([name] ) 360 MG/2.4ML SOCT Inject 1 Cartridge into the skin every 8 (eight) weeks. 2.4 mL 5   rOPINIRole  (REQUIP ) 2 MG tablet Take 1 tablet (2 mg total) by mouth 2 (two) times daily AND 2 tablets (4 mg total) at bedtime for restless legs 360 tablet 3   tizanidine  (ZANAFLEX ) 2 MG capsule Take 1 capsule (2 mg total) by mouth at bedtime for muscle spasm. 90 capsule 3   traMADol  (ULTRAM ) 50 MG  tablet Take 1-2 tablets (50-100 mg total) by mouth every 6 (six) hours as needed (for SEVERE pain ONLY.). NO REFILLS 30 tablet 0   Vitamin D -Vitamin K (K2-D3 5000 PO) Take 5,000 Units by mouth daily.     dexamethasone  (DECADRON ) 0.5 MG/5ML solution Take 5 mLs (0.5 mg total) by mouth 3 (three) times daily. (Patient not taking: Reported on 05/10/2024) 100 mL 1   Plecanatide  (TRULANCE ) 3 MG TABS Take 1 tablet (3 mg total) by mouth daily. Lot: [id number], exp: 09-2024 (Patient not taking: Reported on 05/10/2024) 3 tablet 0   fluconazole  (DIFLUCAN ) 200 MG tablet Take 1 tablet (200 mg total) by mouth once a week. (Patient not taking: Reported on 05/04/2024) 26 tablet 0   No facility-administered medications prior to visit.    Allergies  Allergen Reactions   Methocarbamol  Shortness Of Breath, Nausea Only and Other (See Comments)    Other reaction(s): Dizziness, psychological reaction, disoriented Sleepy and weight loss    Codeine Other (See Comments)    Headache   Statins Other (See Comments)    Extreme pain   Azathioprine    Other Other (See Comments)    MINT=sore mouth and stomach upset   Oxycodone      Dizziness, unbalanced.   Amitriptyline Other (See Comments)    Confusion and hallucinations   Erythromycin Base Rash    Flushing/red face.   Fish Oil Nausea Only   Flax Seed [Flax Seed Oil] Rash   Flaxseed (Linseed) Rash   Hydrocodone  Itching and Rash        Morphine Nausea And Vomiting and Rash   Nsaids Nausea Only   Nuvigil [Armodafinil] Other (See Comments)    Unspecified   Sertraline Rash   Sinemet [Carbidopa-Levodopa] Rash   Sulfa Antibiotics Rash   Tolmetin Nausea  Only    ROS Per HPI    Objective:    Physical Exam Constitutional:      General: She is not in acute distress.    Appearance: She is not ill-appearing.  HENT:     Mouth/Throat:     Mouth: Mucous membranes are moist.     Pharynx: Oropharynx is clear.  Eyes:     Extraocular Movements: Extraocular movements  intact.     Conjunctiva/sclera: Conjunctivae normal.     Pupils: Pupils are equal, round, and reactive to light.  Cardiovascular:     Rate and Rhythm: Normal rate and regular rhythm.  Pulmonary:     Effort: Pulmonary effort is normal.     Breath sounds: Normal breath sounds.  Musculoskeletal:     Cervical back: Normal range of motion and neck supple. No tenderness.     Right lower leg: No edema.     Left lower leg: No edema.  Lymphadenopathy:     Cervical: No cervical adenopathy.  Skin:    General: Skin is warm and dry.  Neurological:     General: No focal deficit present.     Mental Status: She is alert and oriented to person, place, and time.     Motor: No weakness.     Coordination: Coordination normal.  Psychiatric:        Mood and Affect: Mood normal.        Behavior: Behavior normal.        Thought Content: Thought content normal.      BP 108/74   Pulse 68   Temp 97.6 F (36.4 C) (Temporal)   Ht 5' 3 (1.6 m)   SpO2 99%   BMI 26.75 kg/m  Wt Readings from Last 3 Encounters:  05/04/24 151 lb (68.5 kg)  04/19/24 153 lb 3.2 oz (69.5 kg)  04/05/24 152 lb 12.5 oz (69.3 kg)       Assessment & Plan:   Problem List Items Addressed This Visit     Cervical disc disease   Hyperlipidemia associated with type 2 diabetes mellitus (HCC)   Type 2 diabetes mellitus with chronic kidney disease, without long-term current use of insulin  (HCC)   Vitamin D  deficiency   Other Visit Diagnoses       Fall, initial encounter    -  Primary     Acute cystitis without hematuria         Vertigo       Relevant Orders   Ambulatory referral to Physical Therapy     Balance disorder       Relevant Orders   Ambulatory referral to Physical Therapy       Assessment and Plan Assessment & Plan Follow-up after fall with right knee effusion and right thumb injury Recent fall on November 6th, 2025, resulting in right knee effusion and right thumb injury. CT head was negative. Right  knee effusion persists with tenderness and creaking sounds, likely exacerbated by previous surgeries. Right thumb injury is healing with residual pain. - Continue to monitor right knee effusion and thumb healing  Acute cystitis Diagnosed with symptoms of itching and dark yellow urine. Currently on Keflex  for 7 days, started on November 7th, 2025. Symptoms include warm urine and urinary frequency due to smaller bladder size. - Continue Keflex  for 7 days - Increase water  intake to reduce urine concentration - Check urine and send for culture if symptoms persist after completing antibiotics  Type 2 diabetes mellitus Diagnosed with type 2 diabetes  mellitus, currently on metformin. A1c was 6.8% in September 2025. She is motivated to lose weight to improve glycemic control and reduce medication needs. - Continue metformin - Encouraged weight loss and dietary modifications to improve glycemic control - Scheduled follow-up appointment in January 2026 to reassess diabetes management  Hypertriglyceridemia Triglycerides elevated at 261 mg/dL. She is on cholesterol medication. Dietary modifications discussed to help lower triglycerides. - Continue current cholesterol medication - Encouraged dietary modifications, including reducing sweets and increasing healthy fats - Scheduled follow-up appointment in January 2026 to reassess lipid levels  Cervical disc disorder, status post cervical spine surgery Cervical disc disorder with previous surgeries, including disc replacement. Reports neck tightness and grinding sounds. Plans to see neurosurgeon for further evaluation. - Referred to neurosurgeon for evaluation of neck symptoms  Dizziness and vertigo Experiencing dizziness and vertigo, exacerbated by recent air travel. Symptoms include foggy feeling and vertigo when changing positions. Previous balance PT was not effective. - Referred to [facility] for balance and Epley maneuvers -  Advised slow and deliberate changes in position to manage vertigo  Gait abnormality and mobility limitation Gait abnormality and mobility limitations, possibly related to previous spine surgery and current foot issues. Reports difficulty walking and using toes. - Referred to [facility] for balance and mobility training  Peripheral neuropathy Reports neuropathy on one side of the leg, with symptoms worsening if medication is taken later than usual. - Continue current medication regimen - Monitor neuropathy symptoms and adjust medication timing as needed     I have discontinued Lanissa Z. Fear Pat's fluconazole . I am also having her maintain her meclizine , carboxymethylcellulose, Ferrous Sulfate (IRON PO), polyethylene glycol, clobetasol ointment, mupirocin ointment, OVER THE COUNTER MEDICATION, neomycin -polymyxin-hydrocortisone, Black Pepper-Turmeric (TURMERIC PLUS BLACK PEPPER EXT PO), diclofenac  Sodium, gabapentin , fluticasone , Vitamin D -Vitamin K (K2-D3 5000 PO), Calmol-4, DULoxetine , clotrimazole , levothyroxine , rOPINIRole , dexamethasone , [name] , (Glycerin, Laxative, (FLEET LIQUID GLYCERIN SUPP RE)), fenofibrate  micronized, bisoprolol -hydrochlorothiazide , tizanidine , traMADol , Trulance , buPROPion , gabapentin , omeprazole , metFORMIN, lidocaine , benzonatate , oxybutynin , and acyclovir .  No orders of the defined types were placed in this encounter.

## 2024-05-11 ENCOUNTER — Ambulatory Visit

## 2024-05-11 NOTE — Telephone Encounter (Signed)
 Pt is to start on Entyvio. She called today wanting to know how it is going to be covered and if she will get/need pt assistance. Note sent to [place] with the infusion center. Pt aware that she will be notified regarding coverage.

## 2024-05-11 NOTE — Telephone Encounter (Signed)
 Left message for pt to call back

## 2024-05-11 NOTE — Telephone Encounter (Signed)
 Noted

## 2024-05-11 NOTE — Telephone Encounter (Signed)
 Rock, I have forwarded her name to [employer] and they will enroll her.  I will f/u once she is approved. Thanks

## 2024-05-16 ENCOUNTER — Encounter: Payer: Self-pay | Admitting: Family Medicine

## 2024-05-18 ENCOUNTER — Ambulatory Visit (INDEPENDENT_AMBULATORY_CARE_PROVIDER_SITE_OTHER): Admitting: Podiatry

## 2024-05-18 ENCOUNTER — Encounter: Payer: Self-pay | Admitting: Podiatry

## 2024-05-18 DIAGNOSIS — M205X2 Other deformities of toe(s) (acquired), left foot: Secondary | ICD-10-CM

## 2024-05-18 DIAGNOSIS — G629 Polyneuropathy, unspecified: Secondary | ICD-10-CM | POA: Diagnosis not present

## 2024-05-18 DIAGNOSIS — M7751 Other enthesopathy of right foot: Secondary | ICD-10-CM | POA: Diagnosis not present

## 2024-05-19 ENCOUNTER — Encounter: Payer: Self-pay | Admitting: Internal Medicine

## 2024-05-19 ENCOUNTER — Telehealth: Payer: Self-pay

## 2024-05-19 ENCOUNTER — Ambulatory Visit (INDEPENDENT_AMBULATORY_CARE_PROVIDER_SITE_OTHER): Admitting: Internal Medicine

## 2024-05-19 VITALS — BP 120/62 | HR 70 | Temp 98.8°F | Ht 63.0 in | Wt 151.0 lb

## 2024-05-19 DIAGNOSIS — N3000 Acute cystitis without hematuria: Secondary | ICD-10-CM

## 2024-05-19 DIAGNOSIS — H8111 Benign paroxysmal vertigo, right ear: Secondary | ICD-10-CM | POA: Diagnosis not present

## 2024-05-19 DIAGNOSIS — N390 Urinary tract infection, site not specified: Secondary | ICD-10-CM | POA: Insufficient documentation

## 2024-05-19 DIAGNOSIS — R21 Rash and other nonspecific skin eruption: Secondary | ICD-10-CM

## 2024-05-19 DIAGNOSIS — M8580 Other specified disorders of bone density and structure, unspecified site: Secondary | ICD-10-CM | POA: Insufficient documentation

## 2024-05-19 DIAGNOSIS — R2689 Other abnormalities of gait and mobility: Secondary | ICD-10-CM | POA: Insufficient documentation

## 2024-05-19 DIAGNOSIS — R42 Dizziness and giddiness: Secondary | ICD-10-CM | POA: Diagnosis not present

## 2024-05-19 DIAGNOSIS — I10 Essential (primary) hypertension: Secondary | ICD-10-CM | POA: Diagnosis not present

## 2024-05-19 MED ORDER — CIPROFLOXACIN HCL 500 MG PO TABS: 500.0000 mg | ORAL_TABLET | Freq: Two times a day (BID) | ORAL | 0 refills | Status: AC

## 2024-05-19 NOTE — Patient Instructions (Signed)
 Please take all new medication as prescribed - the antibiotic  Please continue all other medications as before, and refills have been done if requested.  Please have the pharmacy call with any other refills you may need.  Please continue your efforts at being more active, low cholesterol diet, and weight control.  Please keep your appointments with your specialists as you may have planned  Please go to the LAB at the blood drawing area for the tests to be done - just the urine testing today  You will be contacted by phone if any changes need to be made immediately.  Otherwise, you will receive a letter about your results with an explanation, but please check with MyChart first.

## 2024-05-19 NOTE — Telephone Encounter (Signed)
 Copied from CRM [id number]. Topic: Clinical - Medical Advice >> May 19, 2024  1:16 PM Gustabo D wrote: Pt wants a call back about her appt today. She need to leave at the latest 4:15 due to another appt. Call her back at [phone]

## 2024-05-19 NOTE — Assessment & Plan Note (Signed)
 Recent onset assoc with fall, unfortunately with drug rash with starting doses of cephalexin , pt now for UA and Cx, but also empiric cipro  500 bid x 7 d course

## 2024-05-19 NOTE — Progress Notes (Signed)
 Patient ID: Crystal [name], female   DOB: [birth year], 73 y.o.   MRN: [id number]        Chief Complaint: follow up UTI, drug rash, htn       HPI:  Crystal [name] is a 73 y.o. female here with c/o recent onset tx several days ago after a fall for UTI with cephalexin , but unfortunately after only 2 days had onset diffuse hive like rash, tx with benadryl and now has only a few lesions to the left upper medial arm with itching.  Has some urinary frequency but Denies urinary symptoms such as dysuria, urgency, flank pain, hematuria or n/v, fever, chills. Pt denies chest pain, increased sob or doe, wheezing, orthopnea, PND, increased LE swelling, palpitations, dizziness or syncope.         Wt Readings from Last 3 Encounters:  05/19/24 151 lb (68.5 kg)  05/04/24 151 lb (68.5 kg)  04/19/24 153 lb 3.2 oz (69.5 kg)   BP Readings from Last 3 Encounters:  05/19/24 120/62  05/10/24 108/74  05/04/24 120/68         Past Medical History:  Diagnosis Date   Allergy  1966   Anemia    Anxiety 2024   Nerve pain   Arthritis 2003   Rhuematologist   ASCVD (arteriosclerotic cardiovascular disease)    Blood transfusion without reported diagnosis    Hospital-surgery   Cataract 2000's   Surgery   Chronic kidney disease 2020's   [facility] long hospital   Closed nondisplaced subtrochanteric fracture of left femur [facility]) 09/03/2017   September 2018.  Status post surgery by Dr. Celena   Crohn's disease of ileum without complication [facility])    DDD (degenerative disc disease), lumbar    Depression 11/27/2010   Son KIA   Dyspnea    Eye infection    Fibromyalgia    Gait disorder 10/26/2012   GERD (gastroesophageal reflux disease) 2009   Gastroenterologist   GI bleed    Glaucoma 2000's   Stable   Hyperlipidemia    Hypertension    Meds   Hypothyroid    Neuromuscular disorder (HCC) 2013   Bells Palsy- right side of face affected   Periprosthetic supracondylar fracture of femur, left   03/23/2017   Pneumonia    Pre-diabetes    Restless leg syndrome    Rhinitis 06/25/2010   Seizures (HCC) ?   TIA   Sleep apnea    does not use CPAP   Spinal headache    with the C section   Stroke (HCC)    Hx TIAs x 3   last was around 2014   TIA (transient ischemic attack)    3         Ulcer    One   Vitamin D  deficiency    Past Surgical History:  Procedure Laterality Date   ABDOMINAL HYSTERECTOMY  1992   ANTERIOR CERVICAL DECOMPRESSION/DISCECTOMY FUSION 4 LEVELS N/A 06/14/2015   Procedure: Cervical three-four Cervical four-five Cervical five- six Cervical six- seven  Anterior cervical decompression/diskectomy/fusion;  Surgeon: Fairy Levels, MD;  Location: [facility];  Service: Neurosurgery;  Laterality: N/A;  C3-4 C4-5 C5-6 C6-7 Anterior cervical decompression/diskectomy/fusion   APPENDECTOMY  1960   BACK SURGERY     surgeries x 3   CATARACT EXTRACTION, BILATERAL     CESAREAN SECTION  1983 & 1886   DRUG INDUCED ENDOSCOPY N/A 02/25/2022   Procedure: DRUG INDUCED SLEEP ENDOSCOPY;  Surgeon: Mable Lenis, MD;  Location: [facility];  Service: ENT;  Laterality: N/A;   EYE SURGERY     bilateral cataracts   FINGER ARTHROSCOPY     FRACTURE SURGERY  2020   Femur   IMPLANTATION OF HYPOGLOSSAL NERVE STIMULATOR Right 05/29/2022   Procedure: IMPLANTATION OF HYPOGLOSSAL NERVE STIMULATOR;  Surgeon: Mable Lenis, MD;  Location: [facility] OR;  Service: ENT;  Laterality: Right;   JOINT REPLACEMENT     2   LEFT  1 RIGHT    KNEE SURGERY     ORIF FEMUR FRACTURE Left 03/24/2017   Procedure: OPEN REDUCTION INTERNAL FIXATION (ORIF) FEMUR FRACTURE;  Surgeon: Celena Sharper, MD;  Location: [facility] OR;  Service: Orthopedics;  Laterality: Left;   REVERSE SHOULDER ARTHROPLASTY Left 10/21/2023   Procedure: ARTHROPLASTY, SHOULDER, TOTAL, REVERSE;  Surgeon: Cristy Bonner DASEN, MD;  Location: [facility];  Service: Orthopedics;  Laterality: Left;   ROTATOR CUFF REPAIR Left 2005   2 LEFT  1  RIGHT    SHOULDER ADHESION RELEASE     SPINAL CORD DECOMPRESSION  1998   SPINE SURGERY     TOE SURGERY     LEFT        TOE SURGERY     RIGHT  BIG TOE   TONSILLECTOMY  1960   TRANSFORAMINAL LUMBAR INTERBODY FUSION W/ MIS 1 LEVEL Left 09/05/2021   Procedure: MINIMALLY INVASIVE TRANSFORAMINAL LUMBAR INTERBODY FUSION LUMBAR THREE-FOUR; EXPLORE FUSION WITH REVISION OF HARDWARE LUMBAR FOUR-FIVE,LEFT SIDE APPROACH;  Surgeon: Carollee Lani BROCKS, DO;  Location: [facility] OR;  Service: Neurosurgery;  Laterality: Left;    reports that she has never smoked. She has been exposed to tobacco smoke. She has never used smokeless tobacco. She reports that she does not currently use alcohol after a past usage of about 4.0 standard drinks of alcohol per week. She reports that she does not use drugs. family history includes Anxiety disorder in her daughter; Arthritis in her mother; Breast cancer in her mother; Cancer in her daughter, mother, sister, and son; Depression in her sister; Diabetes in her mother; Early death in her sister and son; Heart disease in her father; Hyperlipidemia in her father; Hypertension in her father; Leukemia in her sister; Lupus in her sister; Migraines in her daughter; Miscarriages / Stillbirths in her mother, sister, and sister; Neuropathy in her father; Obesity in her mother; Osteoporosis in her mother; Rheum arthritis in her sister; Stroke in her father. Allergies  Allergen Reactions   Methocarbamol  Shortness Of Breath, Nausea Only and Other (See Comments)    Other reaction(s): Dizziness, psychological reaction, disoriented Sleepy and weight loss    Codeine Other (See Comments)    Headache   Statins Other (See Comments)    Extreme pain   Azathioprine    Other Other (See Comments)    MINT=sore mouth and stomach upset   Oxycodone      Dizziness, unbalanced.   Amitriptyline Other (See Comments)    Confusion and hallucinations   Cephalexin  Dermatitis    blisters   Erythromycin Base Rash     Flushing/red face.   Fish Oil Nausea Only   Flax Seed [Flax Seed Oil] Rash   Flaxseed (Linseed) Rash   Hydrocodone  Itching and Rash        Morphine Nausea And Vomiting and Rash   Nsaids Nausea Only   Nuvigil [Armodafinil] Other (See Comments)    Unspecified   Sertraline Rash   Sinemet [Carbidopa-Levodopa] Rash   Sulfa Antibiotics Rash   Tolmetin Nausea Only   Current Outpatient Medications on File Prior to  Visit  Medication Sig Dispense Refill   acyclovir  (ZOVIRAX ) 400 MG tablet TAKE 1 TABLET BY MOUTH EVERY DAY 90 tablet 1   benzonatate  (TESSALON ) 200 MG capsule Take 1 capsule (200 mg total) by mouth 2 (two) times daily as needed for cough. 20 capsule 0   bisoprolol -hydrochlorothiazide  (ZIAC ) 10-6.25 MG tablet Take 1 tablet by mouth daily. 90 tablet 2   Black Pepper-Turmeric (TURMERIC PLUS BLACK PEPPER EXT PO) Take 1 tablet by mouth daily.     buPROPion  (WELLBUTRIN  XL) 300 MG 24 hr tablet Take 1 tablet (300 mg total) by mouth daily for mood, focus & concentration. 90 tablet 0   carboxymethylcellulose (REFRESH PLUS) 0.5 % SOLN Place 1 drop into both eyes 3 (three) times daily as needed (dry eyes).     clobetasol ointment (TEMOVATE) 0.05 % Apply 1 Application topically 3 (three) times daily as needed (lichen planus).     clotrimazole  (MYCELEX ) 10 MG troche Dissolve 1 tablet (10 mg total) in mouth 3 (three) times daily. 140 Troche 11   dexamethasone  (DECADRON ) 0.5 MG/5ML solution Take 5 mLs (0.5 mg total) by mouth 3 (three) times daily. 100 mL 1   diclofenac  Sodium (VOLTAREN ) 1 % GEL Apply 2 grams topically daily as needed (pain). 100 g 1   DULoxetine  (CYMBALTA ) 60 MG capsule Take 1 capsule (60 mg total) by mouth daily for chronic pain. 90 capsule 3   fenofibrate  micronized (LOFIBRA) 134 MG capsule Take 1 capsule (134 mg total) by mouth daily before breakfast 90 capsule 3   Ferrous Sulfate (IRON PO) Take 25 mg by mouth daily.     fluticasone  (FLONASE ) 50 MCG/ACT nasal spray Use  1-2  Sprays  each nostril   1-2 x /day  as needed 16 g 5   gabapentin  (NEURONTIN ) 300 MG capsule Take  1 capsule  3 x /day  or Chronic Pain    (Dx: m79.7)                                               /                                TAKE                                   BY                                  MOUTH     gabapentin  (NEURONTIN ) 600 MG tablet Take 1 tablet (600 mg total) by mouth at bedtime. 30 tablet 5   Glycerin, Laxative, (FLEET LIQUID GLYCERIN SUPP RE) Place 1 Dose rectally daily as needed (Constipation).     levothyroxine  (SYNTHROID ) 88 MCG tablet Take 1 tablet (88 mcg total) by mouth daily on an empty stomach with water  for 30 minutes.  No antacids, calcium , magnesium  for 4 hours. Avoid biotin. 90 tablet 3   lidocaine  (LIDODERM ) 5 % Place 1 patch onto the skin daily. Remove & Discard patch within 12 hours or as directed by MD 30 patch 0   meclizine  (ANTIVERT ) 25 MG tablet TAKE 1 TABLET (25 MG TOTAL) BY MOUTH 3 (THREE) TIMES  DAILY AS NEEDED FOR DIZZINESS. 90 tablet 0   metFORMIN (GLUCOPHAGE-XR) 500 MG 24 hr tablet Take 1 tablet (500 mg total) by mouth daily with breakfast. 90 tablet 0   mupirocin ointment (BACTROBAN) 2 % Apply 1 Application topically 3 (three) times daily as needed (lichen planus).     neomycin -polymyxin-hydrocortisone (CORTISPORIN) OTIC solution Place 3 drops into both ears daily as needed (ear inflammation).     omeprazole  (PRILOSEC) 40 MG capsule Take 1 capsule (40 mg total) by mouth daily. 90 capsule 1   OVER THE COUNTER MEDICATION Place 1 drop under the tongue daily. D3 5000 units - K2 120 mcg     oxybutynin  (DITROPAN ) 5 MG tablet TAKE 1 TABLET 2 X /DAY FR EXCESSIVE SWEATING 180 tablet 1   polyethylene glycol (MIRALAX  / GLYCOLAX ) 17 g packet Take 17-51 g by mouth 3 (three) times daily. Take 3 doses daily     Rectal Protectant-Emollient (CALMOL-4) 76-10 % SUPP Use as directed once to twice daily     rOPINIRole  (REQUIP ) 2 MG tablet Take 1 tablet (2 mg total) by mouth 2  (two) times daily AND 2 tablets (4 mg total) at bedtime for restless legs 360 tablet 3   tizanidine  (ZANAFLEX ) 2 MG capsule Take 1 capsule (2 mg total) by mouth at bedtime for muscle spasm. 90 capsule 3   traMADol  (ULTRAM ) 50 MG tablet Take 1-2 tablets (50-100 mg total) by mouth every 6 (six) hours as needed (for SEVERE pain ONLY.). NO REFILLS 30 tablet 0   Vitamin D -Vitamin K (K2-D3 5000 PO) Take 5,000 Units by mouth daily.     No current facility-administered medications on file prior to visit.        ROS:  All others reviewed and negative.  Objective        PE:  BP 120/62 (BP Location: Right Arm, Patient Position: Sitting, Cuff Size: Normal)   Pulse 70   Temp 98.8 F (37.1 C) (Oral)   Ht 5' 3 (1.6 m)   Wt 151 lb (68.5 kg)   SpO2 98%   BMI 26.75 kg/m                 Constitutional: Pt appears in NAD               HENT: Head: NCAT.                Right Ear: External ear normal.                 Left Ear: External ear normal.                Eyes: . Pupils are equal, round, and reactive to light. Conjunctivae and EOM are normal               Nose: without d/c or deformity               Neck: Neck supple. Gross normal ROM               Cardiovascular: Normal rate and regular rhythm.                 Pulmonary/Chest: Effort normal and breath sounds without rales or wheezing.                Abd:  Soft, NT, ND, + BS, no organomegaly               Neurological: Pt is alert. At baseline orientation, motor grossly  intact               Skin: Skin is warm. No rashes, no other new lesions, LE edema - none               Psychiatric: Pt behavior is normal without agitation   Micro: none  Cardiac tracings I have personally interpreted today:  none  Pertinent Radiological findings (summarize): none   Lab Results  Component Value Date   WBC 5.8 03/23/2024   HGB 14.1 03/23/2024   HCT 42.6 03/23/2024   PLT 292.0 03/23/2024   GLUCOSE 115 (H) 03/23/2024   CHOL 198 03/23/2024   TRIG 261.0  (H) 03/23/2024   HDL 32.40 (L) 03/23/2024   LDLCALC 113 (H) 03/23/2024   ALT 11 03/23/2024   AST 17 03/23/2024   NA 141 03/23/2024   K 4.0 03/23/2024   CL 101 03/23/2024   CREATININE 0.88 03/23/2024   BUN 16 03/23/2024   CO2 31 03/23/2024   TSH 2.22 03/23/2024   INR 1.0 05/01/2020   HGBA1C 6.8 (H) 03/23/2024   MICROALBUR 0.2 05/18/2023   Assessment/Plan:  Kanda Deluna is a 73 y.o. White or Caucasian [1] female with  has a past medical history of Allergy  (1966), Anemia, Anxiety (2024), Arthritis (2003), ASCVD (arteriosclerotic cardiovascular disease), Blood transfusion without reported diagnosis, Cataract (2000's), Chronic kidney disease (2020's), Closed nondisplaced subtrochanteric fracture of left femur (HCC) (09/03/2017), Crohn's disease of ileum without complication (HCC), DDD (degenerative disc disease), lumbar, Depression (11/27/2010), Dyspnea, Eye infection, Fibromyalgia, Gait disorder (10/26/2012), GERD (gastroesophageal reflux disease) (2009), GI bleed, Glaucoma (2000's), Hyperlipidemia, Hypertension, Hypothyroid, Neuromuscular disorder (HCC) (2013), Periprosthetic supracondylar fracture of femur, left  (03/23/2017), Pneumonia, Pre-diabetes, Restless leg syndrome, Rhinitis (06/25/2010), Seizures (HCC) (?), Sleep apnea, Spinal headache, Stroke (HCC), TIA (transient ischemic attack), Ulcer, and Vitamin D  deficiency.  UTI (urinary tract infection) Recent onset assoc with fall, unfortunately with drug rash with starting doses of cephalexin , pt now for UA and Cx, but also empiric cipro  500 bid x 7 d course  Essential hypertension BP Readings from Last 3 Encounters:  05/19/24 120/62  05/10/24 108/74  05/04/24 120/68   Stable, pt to continue medical treatment ziac  10 6.25 qd   Rash Near resolved with only few lesions to left upper arm with itching, ok to continue benadryl 50 mg q 6 hrs prn  Followup: Return if symptoms worsen or fail to improve.  Lynwood Rush, MD  05/19/2024 7:24 PM [facility]

## 2024-05-19 NOTE — Progress Notes (Signed)
 Subjective:   Patient ID: Crystal [name], female   DOB: 73 y.o.   MRN: [id number]   HPI Patient presents concerned about diabetes and keeping her feet in good condition.  States that she does get periodic problems also sees a rheumatologist has fibromyalgia   ROS      Objective:  Physical Exam  Neurovascular status is not changed currently she does get some swelling in the lesser digits but I did not note any acute signs of pathology.  Diabetes is under reasonably good control she is tries to stay active but is concerned about gait instability     Assessment:  Long-term diabetic who does have moderate neuropathy gait instability digital deformities     Plan:  H&P reviewed all these conditions.  She does take gabapentin  which is helpful to control the symptoms she experiences and she is going to physical therapy to try to help with balance.  I did discuss balance bracing educated her on this but will gena hold off currently and just continue to treat conservatively and advised on daily foot inspections

## 2024-05-19 NOTE — Assessment & Plan Note (Signed)
 Near resolved with only few lesions to left upper arm with itching, ok to continue benadryl 50 mg q 6 hrs prn

## 2024-05-19 NOTE — Assessment & Plan Note (Signed)
 BP Readings from Last 3 Encounters:  05/19/24 120/62  05/10/24 108/74  05/04/24 120/68   Stable, pt to continue medical treatment ziac  10 6.25 qd

## 2024-05-20 ENCOUNTER — Ambulatory Visit: Payer: Self-pay | Admitting: Internal Medicine

## 2024-05-20 LAB — URINALYSIS, ROUTINE W REFLEX MICROSCOPIC
Bilirubin Urine: NEGATIVE
Hgb urine dipstick: NEGATIVE
Ketones, ur: NEGATIVE
Nitrite: NEGATIVE
Specific Gravity, Urine: 1.01 (ref 1.000–1.030)
Total Protein, Urine: NEGATIVE
Urine Glucose: NEGATIVE
Urobilinogen, UA: 0.2 (ref 0.0–1.0)
pH: 6 (ref 5.0–8.0)

## 2024-05-20 LAB — URINE CULTURE

## 2024-05-31 NOTE — Telephone Encounter (Signed)
 Patient called requesting to speak regarding Entyvio. States she has not yet started it and would like further guidance on how to proceed. Please advise, thank you.

## 2024-06-01 ENCOUNTER — Encounter

## 2024-06-01 NOTE — Telephone Encounter (Signed)
 Any update on pts entyvio?

## 2024-06-07 ENCOUNTER — Encounter: Payer: 59 | Admitting: Internal Medicine

## 2024-06-07 NOTE — Telephone Encounter (Signed)
 Thanks [employer].  I will let you know if she follows up.

## 2024-06-07 NOTE — Telephone Encounter (Signed)
 Rock, Per Rn notes they have been trying to contact her since 05/05/24 and have not had any response.   Thanks Luke

## 2024-06-07 NOTE — Telephone Encounter (Signed)
 Sent pt mychart message letting her know the infusion center has been trying to reach her to schedule her entybio. Pt read fpl group. Hope she will follow through.

## 2024-06-08 DIAGNOSIS — R42 Dizziness and giddiness: Secondary | ICD-10-CM | POA: Diagnosis not present

## 2024-06-12 ENCOUNTER — Other Ambulatory Visit: Payer: Self-pay

## 2024-06-12 ENCOUNTER — Other Ambulatory Visit: Payer: Self-pay | Admitting: Family Medicine

## 2024-06-12 ENCOUNTER — Other Ambulatory Visit: Payer: Self-pay | Admitting: Nurse Practitioner

## 2024-06-12 ENCOUNTER — Other Ambulatory Visit (HOSPITAL_COMMUNITY): Payer: Self-pay

## 2024-06-13 ENCOUNTER — Other Ambulatory Visit: Payer: Self-pay

## 2024-06-13 ENCOUNTER — Other Ambulatory Visit (HOSPITAL_COMMUNITY): Payer: Self-pay

## 2024-06-13 MED ORDER — BUPROPION HCL ER (XL) 300 MG PO TB24
300.0000 mg | ORAL_TABLET | Freq: Every day | ORAL | 0 refills | Status: AC
  Filled 2024-06-13: qty 90, 90d supply, fill #0

## 2024-06-14 ENCOUNTER — Other Ambulatory Visit: Payer: Self-pay

## 2024-06-17 ENCOUNTER — Other Ambulatory Visit: Payer: Self-pay

## 2024-06-17 ENCOUNTER — Telehealth: Payer: Self-pay | Admitting: Gastroenterology

## 2024-06-17 DIAGNOSIS — K5000 Crohn's disease of small intestine without complications: Secondary | ICD-10-CM

## 2024-06-17 MED ORDER — ENTYVIO PEN 108 MG/0.68ML ~~LOC~~ SOAJ: 108.0000 mg | SUBCUTANEOUS | 1 refills | Status: AC

## 2024-06-17 NOTE — Telephone Encounter (Addendum)
 Per Dr. Leigh RE: Entyvio :  I think after 2 IV infusions we can do the next dose of Entyvio  in place of the next scheduled IV dose and then every 2 weeks thereafter. Is she okay to do the sub-Q or did she want to just continue infusions? Either one okay with me. Can you confirm if she has received the 2 IV infusins already?   Dose would be:  300mg  IV at Week 0, 300mg  IV at Week 2, then 108mg  SQ at Week 6 then every 2 weeks thereafter  Rx sent to [facility] for BIV  Sherry Pennant, PharmD, MPH, BCPS, CPP Clinical Pharmacist

## 2024-06-20 ENCOUNTER — Telehealth: Payer: Self-pay

## 2024-06-20 ENCOUNTER — Other Ambulatory Visit (HOSPITAL_COMMUNITY): Payer: Self-pay

## 2024-06-20 NOTE — Telephone Encounter (Signed)
 Pharmacy Patient Advocate Encounter   Received notification from [employer] that prior authorization for Entyvio  Pen 108MG /0.68ML auto-injectors is required/requested.   Insurance verification completed.   The patient is insured through [facility] ADVANTAGE/RX ADVANCE.   Per test claim: PA required; PA submitted to above mentioned insurance via Latent Key/confirmation #/EOC [id number] Status is pending

## 2024-06-21 ENCOUNTER — Encounter: Payer: Self-pay | Admitting: Gastroenterology

## 2024-06-21 ENCOUNTER — Other Ambulatory Visit: Payer: Self-pay

## 2024-06-21 ENCOUNTER — Other Ambulatory Visit (HOSPITAL_COMMUNITY): Payer: Self-pay

## 2024-06-21 DIAGNOSIS — M5416 Radiculopathy, lumbar region: Secondary | ICD-10-CM

## 2024-06-21 DIAGNOSIS — M5412 Radiculopathy, cervical region: Secondary | ICD-10-CM

## 2024-06-21 NOTE — Telephone Encounter (Signed)
 Pharmacy Patient Advocate Encounter  Received notification from [facility] ADVANTAGE/RX ADVANCE that Prior Authorization for Entyvio  Pen 108MG /0.68ML auto-injectors has been APPROVED from 06-20-2024 to 06-29-2025. Ran test claim, Copay is $100.00. This test claim was processed through [facility]- copay amounts may vary at other pharmacies due to pharmacy/plan contracts, or as the patient moves through the different stages of their insurance plan.   PA #/Case ID/Reference #: [id number]

## 2024-06-27 ENCOUNTER — Ambulatory Visit (HOSPITAL_BASED_OUTPATIENT_CLINIC_OR_DEPARTMENT_OTHER)

## 2024-06-27 DIAGNOSIS — G4733 Obstructive sleep apnea (adult) (pediatric): Secondary | ICD-10-CM

## 2024-07-03 ENCOUNTER — Telehealth: Payer: Self-pay | Admitting: Pharmacy Technician

## 2024-07-03 NOTE — Telephone Encounter (Signed)
 Insurance verification:  [employer]: in-active [employer]: Active.

## 2024-07-04 ENCOUNTER — Encounter: Payer: Self-pay | Admitting: Gastroenterology

## 2024-07-04 ENCOUNTER — Telehealth: Payer: Self-pay

## 2024-07-04 ENCOUNTER — Ambulatory Visit (INDEPENDENT_AMBULATORY_CARE_PROVIDER_SITE_OTHER)

## 2024-07-04 VITALS — BP 136/85 | HR 64 | Temp 97.9°F | Resp 16 | Ht 63.0 in | Wt 151.4 lb

## 2024-07-04 DIAGNOSIS — K5000 Crohn's disease of small intestine without complications: Secondary | ICD-10-CM

## 2024-07-04 MED ORDER — VEDOLIZUMAB 300 MG IV SOLR
300.0000 mg | Freq: Once | INTRAVENOUS | Status: AC
  Administered 2024-07-04: 300 mg via INTRAVENOUS
  Filled 2024-07-04: qty 5

## 2024-07-04 NOTE — Telephone Encounter (Signed)
 Copied from CRM [id number]. Topic: General - Call Back - No Documentation >> Jul 04, 2024  4:11 PM Crystal [name] ORN wrote: Reason for CRM: pt called back to return call from office and said that she was told they had been trying to reach pt for some time . Advised pt of appt reminder. Pt is wanting to confirm there aren't any other notification or reason for contact needed. Please advise

## 2024-07-04 NOTE — Progress Notes (Signed)
 Diagnosis:  Crohn's Disease  Provider:  Praveen Mannam MD  Procedure: IV Infusion  IV Type: Peripheral, IV Location: R Antecubital   Entyvio  (Vedolizumab ), Dose: 300 mg  Infusion Start Time: 1055  Infusion Stop Time: 1130  Post Infusion IV Care: Observation period completed and Peripheral IV Discontinued  Discharge: Condition: Good, Destination: Home . AVS Declined  Performed by:  Maximiano JONELLE Pouch, LPN

## 2024-07-05 NOTE — Telephone Encounter (Signed)
 LM for pt we did not call her

## 2024-07-06 ENCOUNTER — Other Ambulatory Visit: Payer: Self-pay | Admitting: Family Medicine

## 2024-07-07 ENCOUNTER — Encounter: Payer: Self-pay | Admitting: *Deleted

## 2024-07-07 ENCOUNTER — Encounter: Payer: Self-pay | Admitting: Gastroenterology

## 2024-07-07 NOTE — Progress Notes (Signed)
 Crystal [name]                                          MRN: [id number]   07/07/2024   The VBCI Quality Team Specialist reviewed this patient medical record for the purposes of chart review for care gap closure. The following were reviewed: chart review for care gap closure-kidney health evaluation for diabetes:eGFR  and uACR.    VBCI Quality Team

## 2024-07-11 ENCOUNTER — Other Ambulatory Visit (HOSPITAL_COMMUNITY)

## 2024-07-15 ENCOUNTER — Ambulatory Visit (HOSPITAL_COMMUNITY)
Admission: RE | Admit: 2024-07-15 | Discharge: 2024-07-15 | Disposition: A | Discharge status: Home / Self Care | Source: Ambulatory Visit

## 2024-07-15 DIAGNOSIS — M5412 Radiculopathy, cervical region: Secondary | ICD-10-CM | POA: Diagnosis present

## 2024-07-15 DIAGNOSIS — M5416 Radiculopathy, lumbar region: Secondary | ICD-10-CM | POA: Insufficient documentation

## 2024-07-17 ENCOUNTER — Encounter: Payer: Self-pay | Admitting: Gastroenterology

## 2024-07-18 ENCOUNTER — Ambulatory Visit

## 2024-07-18 VITALS — BP 124/81 | HR 60 | Temp 98.4°F | Resp 16 | Ht 63.0 in | Wt 151.4 lb

## 2024-07-18 DIAGNOSIS — K5000 Crohn's disease of small intestine without complications: Secondary | ICD-10-CM

## 2024-07-18 MED ORDER — VEDOLIZUMAB 300 MG IV SOLR
300.0000 mg | Freq: Once | INTRAVENOUS | Status: AC
  Administered 2024-07-18: 300 mg via INTRAVENOUS
  Filled 2024-07-18: qty 5

## 2024-07-18 NOTE — Progress Notes (Signed)
 Diagnosis:  Crohn's Disease  Provider:  Praveen Mannam MD  Procedure: IV Infusion  IV Type: Peripheral, IV Location: L Antecubital   Entyvio  (Vedolizumab ), Dose: 300 mg  Infusion Start Time: 1237  Infusion Stop Time: 1314 pm  Post Infusion IV Care: Observation period completed and Peripheral IV Discontinued  Discharge: Condition: Good, Destination: Home . AVS Declined  Performed by:  Maximiano JONELLE Pouch, LPN

## 2024-07-19 ENCOUNTER — Ambulatory Visit: Payer: PRIVATE HEALTH INSURANCE | Admitting: Primary Care

## 2024-07-19 ENCOUNTER — Telehealth: Payer: Self-pay

## 2024-07-19 DIAGNOSIS — G4733 Obstructive sleep apnea (adult) (pediatric): Secondary | ICD-10-CM

## 2024-07-19 NOTE — Progress Notes (Unsigned)
 "  @Patient  ID: Crystal [name], female    DOB: 1950-07-21, 74 y.o.   MRN: [id number]  No chief complaint on file.   Referring provider: Tonita Fallow, MD  HPI:  74 year old female, never smoked.  Past medical history significant for hypertension, aortic arthrosclerosis, OSA, bronchiectasis without complication, allergic rhinitis, Crohn's, GERD, chronic kidney disease stage II, arthropathy involving shoulder region, COVID-19 virus, recurrent TIAs, right total knee arthroplasty, lumbar spine stenosis, obesity.  Previous [facility] pulmonary encounter:  05/05/2023 Former patient of Dr. Shellia.  Followed by our office for obstructive sleep apnea, allergic rhinitis and bronchiectasis without complication. Patient was intolerant to CPAP and has inspire device (hypoglossal nerve stimulator).    Discussed the use of AI scribe software for clinical note transcription with the patient, who gave verbal consent to proceed.  History of Present Illness   Patient presents today for surgical risk assessment for left should surgery revision, tentatively scheduled for 1 January. She has a history of sleep apnea managed with an Inspire device, allergic rhinitis, and bronchiectasis.   She is tolerating inspire well.  She is 80% compliant with usage greater than 4 hours over the last 30 days.  Average pauses per night 0.0.  Current amplitude 2.0 voltage (level 4).  She had COVID-19 in August which has left her feeling fatigued. She also reports increased difficulty breathing, which she attributes to allergies or sensitivities. She has been taking an allergy  pill, possibly Allegra , and using Flonase  once daily, but reports these measures are not providing sufficient relief.  She reports that she stays stocked up, suggesting nasal congestion. The patient has a history of receiving allergy  shots and has a deviated septum from frequent falls.  The patient's fatigue is not attributed to sleep apnea, as her  Inspire device download shows no pauses in breathing. The patient is on level four of the Inspire device, which is just below the highest level.   The patient is not on oxygen  or blood thinners and has no history of blood clots.    07/14/2023- interim hx  Patient presents today for OSA/Inspire follow-up.   Discussed the use of AI scribe software for clinical note transcription with the patient, who gave verbal consent to proceed.  History of Present Illness   Hx sleep apnea, intolerant to CPAP. Patient uses Inspire device consistently. She is 80% compliant with usage greater than 4 hours over the last 30 days (06/10/23-07/09/23).  Average usage 8 hours 15 mins. Average pauses per night 0.0. Current amplitude 2.0 voltage (level 5).  She reports sporadic insomnia, occurring approximately once or twice a month, which she attributes to an overactive mind. She denies any correlation between these episodes and her Inspire device.  Despite these occasional sleep disturbances, the patient expresses confidence in her Inspire device, noting improved sleep quality and reduced nocturnal awakenings. However, she reports an increase in sleep talking, which does not disrupt her own sleep but does affect her partner.  The patient also describes an incident where she fell and hit her head during a daytime nap, during which the Inspire device was not in use. She recalls acting out her dreams, including making movements in the air, which she realizes upon waking were not interacting with anything tangible.  The patient also mentions an upcoming surgery for a shoulder condition. She expresses concern about a persistent discomfort in her back, which started during a bout of COVID-19 before Labor Day and has not completely resolved. The discomfort is located in the back  and radiates across the body, and is exacerbated by breathing. The patient is unsure whether this discomfort is due to a muscle pull, a kidney issue, or  another cause. She has no significant cough.   The patient is currently using her Inspire device at a level five, one below her highest possible setting. She reports that this level is comfortable, but notes that her tongue is constantly moving and does not settle down. She also mentions that she often wakes up in the morning with the device already off, and sometimes forgets to pause it when she wakes up in the middle of the night.         07/19/2024- interim hx  Discussed the use of AI scribe software for clinical note transcription with the patient, who gave verbal consent to proceed.  History of Present Illness      Allergies[1]  Immunization History  Administered Date(s) Administered   DT (Pediatric) 01/03/2015   Fluad Quad(high Dose 65+) 04/25/2022, 05/01/2023   Fluad Trivalent(High Dose 65+) 02/19/2018   INFLUENZA, HIGH DOSE SEASONAL PF 03/13/2015, 07/21/2019, 02/16/2020, 04/08/2021   Influenza Split 03/30/2010, 03/31/2011, 05/14/2014, 02/27/2016, 04/16/2018   Influenza Whole 03/30/2010   Influenza, Quadrivalent, Recombinant, Inj, Pf 03/17/2024   Influenza,trivalent, recombinat, inj, PF 03/17/2024   Influenza-Unspecified 03/31/2011, 05/14/2014, 02/27/2016, 04/16/2018   PFIZER Comirnaty(Gray Top)Covid-19 Tri-Sucrose Vaccine 09/19/2019, 10/10/2019, 07/13/2020, 05/01/2023   PFIZER(Purple Top)SARS-COV-2 Vaccination 09/19/2019, 10/10/2019   Pfizer Covid-19 Vaccine Bivalent Booster 96yrs & up 05/14/2021   Pneumococcal Conjugate-13 06/30/2005, 04/17/2015, 01/23/2019   Pneumococcal Polysaccharide-23 01/06/2017   Pneumococcal-Unspecified 06/30/2005   Respiratory Syncytial Virus Vaccine,Recomb Aduvanted(Arexvy) 07/01/2022   Td 06/30/2004   Td (Adult),5 Lf Tetanus Toxid, Preservative Free 06/30/2004   Td,absorbed, Preservative Free, Adult Use, Lf Unspecified 06/30/2004   Unspecified SARS-COV-2 Vaccination 04/25/2022   Zoster Recombinant(Shingrix) 05/14/2021, 08/22/2021   Zoster,  Live 07/01/2007    Past Medical History:  Diagnosis Date   Allergy  1966   Anemia    Anxiety 2024   Nerve pain   Arthritis 2003   Rhuematologist   ASCVD (arteriosclerotic cardiovascular disease)    Blood transfusion without reported diagnosis    Hospital-surgery   Cataract 2000s   Surgery   Chronic kidney disease 2020s   [facility] long hospital   Closed nondisplaced subtrochanteric fracture of left femur (HCC) 09/03/2017   September 2018.  Status post surgery by Dr. Celena   Crohn's disease of ileum without complication [facility])    DDD (degenerative disc disease), lumbar    Depression 11/27/2010   Son KIA   Dyspnea    Eye infection    Fibromyalgia    Gait disorder 10/26/2012   GERD (gastroesophageal reflux disease) 2009   Gastroenterologist   GI bleed    Glaucoma 2000s   Stable   Hyperlipidemia    Hypertension    Meds   Hypothyroid    Neuromuscular disorder (HCC) 2013   Bells Palsy- right side of face affected   Periprosthetic supracondylar fracture of femur, left  03/23/2017   Pneumonia    Pre-diabetes    Restless leg syndrome    Rhinitis 06/25/2010   Seizures (HCC) ?   TIA   Sleep apnea    does not use CPAP   Spinal headache    with the C section   Stroke (HCC)    Hx TIAs x 3   last was around 2014   TIA (transient ischemic attack)    3         Ulcer  One   Vitamin D  deficiency     Tobacco History: Tobacco Use History[2] Counseling given: Not Answered   Outpatient Medications Prior to Visit  Medication Sig Dispense Refill   acyclovir  (ZOVIRAX ) 400 MG tablet TAKE 1 TABLET BY MOUTH EVERY DAY 90 tablet 1   benzonatate  (TESSALON ) 200 MG capsule Take 1 capsule (200 mg total) by mouth 2 (two) times daily as needed for cough. 20 capsule 0   bisoprolol -hydrochlorothiazide  (ZIAC ) 10-6.25 MG tablet Take 1 tablet by mouth daily. 90 tablet 2   Black Pepper-Turmeric (TURMERIC PLUS BLACK PEPPER EXT PO) Take 1 tablet by mouth daily.     buPROPion  (WELLBUTRIN  XL)  300 MG 24 hr tablet Take 1 tablet (300 mg total) by mouth daily for mood, focus & concentration. 90 tablet 0   carboxymethylcellulose (REFRESH PLUS) 0.5 % SOLN Place 1 drop into both eyes 3 (three) times daily as needed (dry eyes).     clobetasol ointment (TEMOVATE) 0.05 % Apply 1 Application topically 3 (three) times daily as needed (lichen planus).     clotrimazole  (MYCELEX ) 10 MG troche Dissolve 1 tablet (10 mg total) in mouth 3 (three) times daily. 140 Troche 11   dexamethasone  (DECADRON ) 0.5 MG/5ML solution Take 5 mLs (0.5 mg total) by mouth 3 (three) times daily. 100 mL 1   diclofenac  Sodium (VOLTAREN ) 1 % GEL Apply 2 grams topically daily as needed (pain). 100 g 1   DULoxetine  (CYMBALTA ) 60 MG capsule Take 1 capsule (60 mg total) by mouth daily for chronic pain. 90 capsule 3   fenofibrate  micronized (LOFIBRA) 134 MG capsule Take 1 capsule (134 mg total) by mouth daily before breakfast 90 capsule 3   Ferrous Sulfate (IRON PO) Take 25 mg by mouth daily.     fluticasone  (FLONASE ) 50 MCG/ACT nasal spray Use  1-2 Sprays  each nostril   1-2 x /day  as needed 16 g 5   gabapentin  (NEURONTIN ) 300 MG capsule Take  1 capsule  3 x /day  or Chronic Pain    (Dx: m79.7)                                               /                                TAKE                                   BY                                  MOUTH     gabapentin  (NEURONTIN ) 600 MG tablet Take 1 tablet (600 mg total) by mouth at bedtime. 30 tablet 5   Glycerin, Laxative, (FLEET LIQUID GLYCERIN SUPP RE) Place 1 Dose rectally daily as needed (Constipation).     levothyroxine  (SYNTHROID ) 88 MCG tablet Take 1 tablet (88 mcg total) by mouth daily on an empty stomach with water  for 30 minutes.  No antacids, calcium , magnesium  for 4 hours. Avoid biotin. 90 tablet 3   lidocaine  (LIDODERM ) 5 % Place 1 patch onto the skin daily. Remove & Discard patch within 12 hours or  as directed by MD 30 patch 0   meclizine  (ANTIVERT ) 25 MG tablet TAKE 1  TABLET (25 MG TOTAL) BY MOUTH 3 (THREE) TIMES DAILY AS NEEDED FOR DIZZINESS. 90 tablet 0   metFORMIN  (GLUCOPHAGE -XR) 500 MG 24 hr tablet TAKE 1 TABLET BY MOUTH EVERY DAY WITH BREAKFAST 90 tablet 0   mupirocin ointment (BACTROBAN) 2 % Apply 1 Application topically 3 (three) times daily as needed (lichen planus).     neomycin -polymyxin-hydrocortisone (CORTISPORIN) OTIC solution Place 3 drops into both ears daily as needed (ear inflammation).     omeprazole  (PRILOSEC) 40 MG capsule Take 1 capsule (40 mg total) by mouth daily. 90 capsule 1   OVER THE COUNTER MEDICATION Place 1 drop under the tongue daily. D3 5000 units - K2 120 mcg     oxybutynin  (DITROPAN ) 5 MG tablet TAKE 1 TABLET 2 X /DAY FR EXCESSIVE SWEATING 180 tablet 1   polyethylene glycol (MIRALAX  / GLYCOLAX ) 17 g packet Take 17-51 g by mouth 3 (three) times daily. Take 3 doses daily     Rectal Protectant-Emollient (CALMOL-4) 76-10 % SUPP Use as directed once to twice daily     rOPINIRole  (REQUIP ) 2 MG tablet Take 1 tablet (2 mg total) by mouth 2 (two) times daily AND 2 tablets (4 mg total) at bedtime for restless legs 360 tablet 3   tizanidine  (ZANAFLEX ) 2 MG capsule Take 1 capsule (2 mg total) by mouth at bedtime for muscle spasm. 90 capsule 3   traMADol  (ULTRAM ) 50 MG tablet Take 1-2 tablets (50-100 mg total) by mouth every 6 (six) hours as needed (for SEVERE pain ONLY.). NO REFILLS 30 tablet 0   Vedolizumab  (ENTYVIO  PEN) 108 MG/0.68ML SOAJ Inject 108 mg into the skin every 14 (fourteen) days. Inject at Week 6 (4 weeks after Week 2 infusion) then every 2 weeks thereafter 1.36 mL 1   Vitamin D -Vitamin K (K2-D3 5000 PO) Take 5,000 Units by mouth daily.     No facility-administered medications prior to visit.      Review of Systems  Review of Systems   Physical Exam  There were no vitals taken for this visit. Physical Exam  ***  Lab Results:  CBC    Component Value Date/Time   WBC 5.8 03/23/2024 0840   RBC 4.59 03/23/2024  0840   HGB 14.1 03/23/2024 0840   HGB 12.9 10/20/2012 1358   HCT 42.6 03/23/2024 0840   HCT 40.2 10/20/2012 1358   PLT 292.0 03/23/2024 0840   PLT 265 10/20/2012 1358   MCV 92.8 03/23/2024 0840   MCV 82.1 10/20/2012 1358   MCH 31.9 10/23/2023 1829   MCHC 33.1 03/23/2024 0840   RDW 13.1 03/23/2024 0840   RDW 16.9 (H) 10/20/2012 1358   LYMPHSABS 1.4 03/23/2024 0840   LYMPHSABS 1.3 10/20/2012 1358   MONOABS 0.5 03/23/2024 0840   MONOABS 0.6 10/20/2012 1358   EOSABS 0.2 03/23/2024 0840   EOSABS 0.1 10/20/2012 1358   BASOSABS 0.1 03/23/2024 0840   BASOSABS 0.1 10/20/2012 1358    BMET    Component Value Date/Time   NA 141 03/23/2024 0840   NA 142 03/28/2017 0000   NA 141 10/20/2012 1358   K 4.0 03/23/2024 0840   K 3.9 10/20/2012 1358   CL 101 03/23/2024 0840   CL 104 10/20/2012 1358   CO2 31 03/23/2024 0840   CO2 27 10/20/2012 1358   GLUCOSE 115 (H) 03/23/2024 0840   GLUCOSE 109 (H) 10/20/2012 1358   BUN 16 03/23/2024  0840   BUN 14 03/28/2017 0000   BUN 15.6 10/20/2012 1358   CREATININE 0.88 03/23/2024 0840   CREATININE 1.03 (H) 05/18/2023 1528   CREATININE 1.1 10/20/2012 1358   CALCIUM  9.3 03/23/2024 0840   CALCIUM  7.8 (L) 03/26/2017 0354   CALCIUM  9.7 10/20/2012 1358   GFRNONAA >60 10/23/2023 1829   GFRNONAA 80 07/04/2020 1457   GFRAA 93 07/04/2020 1457    BNP    Component Value Date/Time   BNP 35.6 10/23/2023 1829    ProBNP No results found for: PROBNP  Imaging: MR CERVICAL SPINE WO CONTRAST Result Date: 07/16/2024 MR CERVICAL SPINE WITHOUT CONTRAST CLINICAL HISTORY:  Neck pain. TECHNIQUE: Multiplanar, multi-weighted MRI of the cervical spine without contrast. COMPARISON: MRI from July 10, 2020 FINDINGS: There is postoperative change from C3 to C7 related to anterior cervical fusion and intervertebral disc hardware placement with bony continuity across the disc spaces. Congenital fusion at C2-3 along the posterior elements. No fracture or vertebral body  height loss. No significant bone marrow edema within the limitations of artifact. 2 mm anterolisthesis at C7-T1. Cord signal and craniocervical junction are unremarkable. C2-C3: Congenital fusion. No disc bulge, central canal stenosis, or neuroforaminal stenosis. C3-C4: Postoperative change. No disc bulge, central canal stenosis, or neuroforaminal stenosis. C4-C5: Postoperative change. Patent canal. Uncovertebral hypertrophy causes mild right neuroforaminal stenosis. C5-C6: Postoperative change. No disc bulge, central canal stenosis, or neuroforaminal stenosis. C6-C7: Postoperative change. No disc bulge or herniation. Patent canal. Uncovertebral hypertrophy. Mild bilateral neuroforaminal stenosis. C7-T1: No disc bulge, central canal stenosis, or neuroforaminal stenosis. Moderate bilateral facet arthropathy. Shallow disc bulges at T1-T2 3 are partially included on sagittal imaging without high-grade stenosis. This is stable. IMPRESSION: 1.  Stable postoperative change from C3 to C7 with bony fusion. 2. Shallow disc bulges in the upper thoracic spine, partially included on sagittal imaging. Electronically signed by: Venetia Neer MD 07/16/2024 06:14 AM EST RP Workstation: [id number]   MR LUMBAR SPINE WO CONTRAST Result Date: 07/16/2024 MR LUMBAR SPINE WITHOUT CONTRAST HISTORY: Lower back pain TECHNIQUE: Multiplanar, multi-sequence imaging of the lumbar spine was performed without contrast. COMPARISON: May 28, 2021 FINDINGS: There is postoperative change from L3 to S1 with transpedicular fixation from L3 to L5 and intervertebral disc hardware at L4-5 and L5-S1. There is 8 mm anterolisthesis at L5-S1. There is bony continuity across the disc space at L4-5. Moderate to severe disc height loss is seen at L3-4. There is 1 to 2 mm retrolisthesis at L2-3. There is anterior spurring within the lower thoracic spine with mild disc height loss. Moderate 1 edema is seen along the opposing endplates at T9-10. At T9-10,  T10-11, and T11-T12, there are circumferential disc bulges without high-grade stenosis. T12-L1: Circumferential disc bulge without significant canal or foraminal stenosis. Mild bilateral facet arthropathy with bilateral facet joint effusions. L1-2: Generalized posterior disc bulge without significant canal or foraminal stenosis. Mild bilateral facet arthropathy. L2-3: Circumferential disc bulge causes mild bilateral neuroforaminal stenosis and mild central canal stenosis, mildly progressed. Moderate bilateral facet arthropathy with right facet joint effusion. L3-4: Interval postoperative change. Shallow posterior disc bulge without significant canal or foraminal stenosis. L4-5: Postoperative change. No disc bulge, central canal stenosis, or neuroforaminal stenosis. L5-S1: Postoperative change. Grade 1-2 anterolisthesis causes mild bilateral neuroforaminal stenosis. IMPRESSION: 1. Postoperative change from L3 to S1 as described. Previously noted stenosis at L3-4 has improved. 2. Mildly increased narrowing at L2-3 with mild central canal stenosis and mild bilateral neuroforaminal stenosis. Electronically signed by: Venetia Neer MD 07/16/2024  06:06 AM EST RP Workstation: [id number]     Assessment & Plan:   No problem-specific Assessment & Plan notes found for this encounter.   1. Obstructive sleep apnea (Primary)   Assessment and Plan Assessment & Plan       I personally spent a total of *** minutes in the care of the patient today including {Time Based Coding:210964241}.   Almarie LELON Ferrari, NP 07/19/2024    [1]  Allergies Allergen Reactions   Methocarbamol  Shortness Of Breath, Nausea Only and Other (See Comments)    Other reaction(s): Dizziness, psychological reaction, disoriented Sleepy and weight loss    Codeine Other (See Comments)    Headache   Statins Other (See Comments)    Extreme pain   Azathioprine    Other Other (See Comments)    MINT=sore mouth and stomach upset    Oxycodone      Dizziness, unbalanced.   Amitriptyline Other (See Comments)    Confusion and hallucinations   Cephalexin  Dermatitis    blisters   Erythromycin Base Rash    Flushing/red face.   Fish Oil Nausea Only   Flax Seed [Flax Seed Oil] Rash   Flaxseed (Linseed) Rash   Hydrocodone  Itching and Rash        Morphine Nausea And Vomiting and Rash   Nsaids Nausea Only   Nuvigil [Armodafinil] Other (See Comments)    Unspecified   Sertraline Rash   Sinemet [Carbidopa-Levodopa] Rash   Sulfa Antibiotics Rash   Tolmetin Nausea Only  [2]  Social History Tobacco Use  Smoking Status Never   Passive exposure: Past  Smokeless Tobacco Never   "

## 2024-07-19 NOTE — Telephone Encounter (Signed)
 PCC's, please look into this!!

## 2024-07-19 NOTE — Telephone Encounter (Signed)
 Watchpat sleep study was reassigned to Dr. Olena to interpret. Patient rescheduled to 07/28/2024 at 11:30am. Inspire reps aware of change.  Dr. Olena, can you please interpreter this study when you are available?

## 2024-07-19 NOTE — Telephone Encounter (Signed)
 I called and spoke to pt. Pt recently completed a HST but the results are not in pt's chart yet. Pt is with Inspire, but will need to be rescheduled for another 1-2 weeks so we have time to receive results. Routing to front office Manager to have pt rescheduled. Pt aware. NFN

## 2024-07-22 ENCOUNTER — Other Ambulatory Visit (HOSPITAL_COMMUNITY): Payer: Self-pay

## 2024-07-22 ENCOUNTER — Telehealth: Payer: Self-pay

## 2024-07-22 MED ORDER — LEVOTHYROXINE SODIUM 88 MCG PO TABS: 88.0000 ug | ORAL_TABLET | Freq: Every day | ORAL | 1 refills | Status: AC

## 2024-07-22 NOTE — Addendum Note (Signed)
 Addended by: Diany Formosa E on: 07/22/2024 01:08 PM   Modules accepted: Orders

## 2024-07-22 NOTE — Telephone Encounter (Signed)
 Copied from CRM #[id number]. Topic: Clinical - Medication Question >> Jul 22, 2024 12:58 PM Crystal [name] wrote: Reason for CRM: Patient stated she needs a new prescription written for levothyroxine  (SYNTHROID ) 88 MCG tablet and she has no more left and needs them asap. And can she send it to  [id number] [place], [place] - [address] [place] [address] Phone: [phone] Fax: [phone] Hours: Not open 24 hours

## 2024-07-22 NOTE — Telephone Encounter (Signed)
 Rx sent.

## 2024-07-24 ENCOUNTER — Other Ambulatory Visit: Payer: Self-pay | Admitting: Family Medicine

## 2024-07-24 DIAGNOSIS — Z79899 Other long term (current) drug therapy: Secondary | ICD-10-CM

## 2024-07-26 ENCOUNTER — Encounter

## 2024-07-26 ENCOUNTER — Telehealth (INDEPENDENT_AMBULATORY_CARE_PROVIDER_SITE_OTHER): Payer: Self-pay | Admitting: Pulmonary Disease

## 2024-07-26 DIAGNOSIS — G4736 Sleep related hypoventilation in conditions classified elsewhere: Secondary | ICD-10-CM

## 2024-07-26 DIAGNOSIS — G4733 Obstructive sleep apnea (adult) (pediatric): Secondary | ICD-10-CM

## 2024-07-26 DIAGNOSIS — R0902 Hypoxemia: Secondary | ICD-10-CM

## 2024-07-26 NOTE — Telephone Encounter (Signed)
 Sleep study interpreted: severe OSA and sleep-related hypoxemia that appears somewhat out of proportion to the severity of the patient's sleep apnea was identified. Cc'ing ordering provider.  Lamar JINNY Dales, MD

## 2024-07-27 ENCOUNTER — Other Ambulatory Visit: Payer: Self-pay | Admitting: Primary Care

## 2024-07-27 DIAGNOSIS — R051 Acute cough: Secondary | ICD-10-CM

## 2024-07-27 MED ORDER — BENZONATATE 200 MG PO CAPS: 200.0000 mg | ORAL_CAPSULE | Freq: Two times a day (BID) | ORAL | 0 refills | Status: AC | PRN

## 2024-07-27 NOTE — Telephone Encounter (Signed)
 Thanks

## 2024-07-27 NOTE — Telephone Encounter (Signed)
 The signed sleep study has been uploaded in Pts chart. NFN

## 2024-07-27 NOTE — Telephone Encounter (Signed)
 Thanks Ronal Lacks

## 2024-07-27 NOTE — Telephone Encounter (Signed)
 Do we have the actual data from the HST. This is an annual follow-up for inspire

## 2024-07-27 NOTE — Progress Notes (Unsigned)
 "  @Patient  ID: Crystal [name], female    DOB: Nov 05, 1950, 74 y.o.   MRN: [id number]  No chief complaint on file.   Referring provider: Tonita Fallow, MD  HPI:   74 year old female, never smoked.  Past medical history significant for hypertension, aortic arthrosclerosis, OSA, bronchiectasis without complication, allergic rhinitis, Crohn's, GERD, chronic kidney disease stage II, arthropathy involving shoulder region, COVID-19 virus, recurrent TIAs, right total knee arthroplasty, lumbar spine stenosis, obesity.  Previous [facility] pulmonary encounter:  05/05/2023 Former patient of Dr. Shellia.  Followed by our office for obstructive sleep apnea, allergic rhinitis and bronchiectasis without complication. Patient was intolerant to CPAP and has inspire device (hypoglossal nerve stimulator).    Discussed the use of AI scribe software for clinical note transcription with the patient, who gave verbal consent to proceed.  History of Present Illness   Patient presents today for surgical risk assessment for left should surgery revision, tentatively scheduled for 1 January. She has a history of sleep apnea managed with an Inspire device, allergic rhinitis, and bronchiectasis.   She is tolerating inspire well.  She is 80% compliant with usage greater than 4 hours over the last 30 days.  Average pauses per night 0.0.  Current amplitude 2.0 voltage (level 4).  She had COVID-19 in August which has left her feeling fatigued. She also reports increased difficulty breathing, which she attributes to allergies or sensitivities. She has been taking an allergy  pill, possibly Allegra , and using Flonase  once daily, but reports these measures are not providing sufficient relief.  She reports that she stays stocked up, suggesting nasal congestion. The patient has a history of receiving allergy  shots and has a deviated septum from frequent falls.  The patient's fatigue is not attributed to sleep apnea, as her  Inspire device download shows no pauses in breathing. The patient is on level four of the Inspire device, which is just below the highest level.   The patient is not on oxygen  or blood thinners and has no history of blood clots.    07/14/2023- interim hx  Patient presents today for OSA/Inspire follow-up.   Discussed the use of AI scribe software for clinical note transcription with the patient, who gave verbal consent to proceed.  History of Present Illness   Hx sleep apnea, intolerant to CPAP. Patient uses Inspire device consistently. She is 80% compliant with usage greater than 4 hours over the last 30 days (06/10/23-07/09/23).  Average usage 8 hours 15 mins. Average pauses per night 0.0. Current amplitude 2.0 voltage (level 5).  She reports sporadic insomnia, occurring approximately once or twice a month, which she attributes to an overactive mind. She denies any correlation between these episodes and her Inspire device.  Despite these occasional sleep disturbances, the patient expresses confidence in her Inspire device, noting improved sleep quality and reduced nocturnal awakenings. However, she reports an increase in sleep talking, which does not disrupt her own sleep but does affect her partner.  The patient also describes an incident where she fell and hit her head during a daytime nap, during which the Inspire device was not in use. She recalls acting out her dreams, including making movements in the air, which she realizes upon waking were not interacting with anything tangible.  The patient also mentions an upcoming surgery for a shoulder condition. She expresses concern about a persistent discomfort in her back, which started during a bout of COVID-19 before Labor Day and has not completely resolved. The discomfort is located in the  back and radiates across the body, and is exacerbated by breathing. The patient is unsure whether this discomfort is due to a muscle pull, a kidney issue, or  another cause. She has no significant cough.   The patient is currently using her Inspire device at a level five, one below her highest possible setting. She reports that this level is comfortable, but notes that her tongue is constantly moving and does not settle down. She also mentions that she often wakes up in the morning with the device already off, and sometimes forgets to pause it when she wakes up in the middle of the night.        07/28/2024- FU Inspire/review HST Discussed the use of AI scribe software for clinical note transcription with the patient, who gave verbal consent to proceed.  History of Present Illness    Sleep study interpreted: severe OSA and sleep-related hypoxemia that appears somewhat out of proportion to the severity of the patient's sleep apnea was identified. Cc'ing ordering provider.   Allergies[1]  Immunization History  Administered Date(s) Administered   DT (Pediatric) 01/03/2015   Fluad Quad(high Dose 65+) 04/25/2022, 05/01/2023   Fluad Trivalent(High Dose 65+) 02/19/2018   INFLUENZA, HIGH DOSE SEASONAL PF 03/13/2015, 07/21/2019, 02/16/2020, 04/08/2021   Influenza Split 03/30/2010, 03/31/2011, 05/14/2014, 02/27/2016, 04/16/2018   Influenza Whole 03/30/2010   Influenza, Quadrivalent, Recombinant, Inj, Pf 03/17/2024   Influenza,trivalent, recombinat, inj, PF 03/17/2024   Influenza-Unspecified 03/31/2011, 05/14/2014, 02/27/2016, 04/16/2018   PFIZER Comirnaty(Gray Top)Covid-19 Tri-Sucrose Vaccine 09/19/2019, 10/10/2019, 07/13/2020, 05/01/2023   PFIZER(Purple Top)SARS-COV-2 Vaccination 09/19/2019, 10/10/2019   Pfizer Covid-19 Vaccine Bivalent Booster 44yrs & up 05/14/2021   Pneumococcal Conjugate-13 06/30/2005, 04/17/2015, 01/23/2019   Pneumococcal Polysaccharide-23 01/06/2017   Pneumococcal-Unspecified 06/30/2005   Respiratory Syncytial Virus Vaccine,Recomb Aduvanted(Arexvy) 07/01/2022   Td 06/30/2004   Td (Adult),5 Lf Tetanus Toxid, Preservative  Free 06/30/2004   Td,absorbed, Preservative Free, Adult Use, Lf Unspecified 06/30/2004   Unspecified SARS-COV-2 Vaccination 04/25/2022   Zoster Recombinant(Shingrix) 05/14/2021, 08/22/2021   Zoster, Live 07/01/2007    Past Medical History:  Diagnosis Date   Allergy  1966   Anemia    Anxiety 2024   Nerve pain   Arthritis 2003   Rhuematologist   ASCVD (arteriosclerotic cardiovascular disease)    Blood transfusion without reported diagnosis    Hospital-surgery   Cataract 2000s   Surgery   Chronic kidney disease 2020s   [facility]   Closed nondisplaced subtrochanteric fracture of left femur (HCC) 09/03/2017   September 2018.  Status post surgery by Dr. Celena   Crohn's disease of ileum without complication [facility])    DDD (degenerative disc disease), lumbar    Depression 11/27/2010   Son KIA   Dyspnea    Eye infection    Fibromyalgia    Gait disorder 10/26/2012   GERD (gastroesophageal reflux disease) 2009   Gastroenterologist   GI bleed    Glaucoma 2000s   Stable   Hyperlipidemia    Hypertension    Meds   Hypothyroid    Neuromuscular disorder (HCC) 2013   Bells Palsy- right side of face affected   Periprosthetic supracondylar fracture of femur, left  03/23/2017   Pneumonia    Pre-diabetes    Restless leg syndrome    Rhinitis 06/25/2010   Seizures (HCC) ?   TIA   Sleep apnea    does not use CPAP   Spinal headache    with the C section   Stroke (HCC)    Hx TIAs x  3   last was around 2014   TIA (transient ischemic attack)    3         Ulcer    One   Vitamin D  deficiency     Tobacco History: Tobacco Use History[2] Counseling given: Not Answered   Outpatient Medications Prior to Visit  Medication Sig Dispense Refill   acyclovir  (ZOVIRAX ) 400 MG tablet TAKE 1 TABLET BY MOUTH EVERY DAY 90 tablet 1   benzonatate  (TESSALON ) 200 MG capsule Take 1 capsule (200 mg total) by mouth 2 (two) times daily as needed for cough. 20 capsule 0    bisoprolol -hydrochlorothiazide  (ZIAC ) 10-6.25 MG tablet TAKE 1 TABLET BY MOUTH EVERY DAY 90 tablet 1   Black Pepper-Turmeric (TURMERIC PLUS BLACK PEPPER EXT PO) Take 1 tablet by mouth daily.     buPROPion  (WELLBUTRIN  XL) 300 MG 24 hr tablet Take 1 tablet (300 mg total) by mouth daily for mood, focus & concentration. 90 tablet 0   carboxymethylcellulose (REFRESH PLUS) 0.5 % SOLN Place 1 drop into both eyes 3 (three) times daily as needed (dry eyes).     clobetasol ointment (TEMOVATE) 0.05 % Apply 1 Application topically 3 (three) times daily as needed (lichen planus).     clotrimazole  (MYCELEX ) 10 MG troche Dissolve 1 tablet (10 mg total) in mouth 3 (three) times daily. 140 Troche 11   dexamethasone  (DECADRON ) 0.5 MG/5ML solution Take 5 mLs (0.5 mg total) by mouth 3 (three) times daily. 100 mL 1   diclofenac  Sodium (VOLTAREN ) 1 % GEL Apply 2 grams topically daily as needed (pain). 100 g 1   DULoxetine  (CYMBALTA ) 60 MG capsule Take 1 capsule (60 mg total) by mouth daily for chronic pain. 90 capsule 3   fenofibrate  micronized (LOFIBRA) 134 MG capsule Take 1 capsule (134 mg total) by mouth daily before breakfast 90 capsule 3   Ferrous Sulfate (IRON PO) Take 25 mg by mouth daily.     fluticasone  (FLONASE ) 50 MCG/ACT nasal spray Use  1-2 Sprays  each nostril   1-2 x /day  as needed 16 g 5   gabapentin  (NEURONTIN ) 300 MG capsule Take  1 capsule  3 x /day  or Chronic Pain    (Dx: m79.7)                                               /                                TAKE                                   BY                                  MOUTH     gabapentin  (NEURONTIN ) 600 MG tablet Take 1 tablet (600 mg total) by mouth at bedtime. 30 tablet 5   Glycerin, Laxative, (FLEET LIQUID GLYCERIN SUPP RE) Place 1 Dose rectally daily as needed (Constipation).     levothyroxine  (SYNTHROID ) 88 MCG tablet Take 1 tablet (88 mcg total) by mouth daily on an empty stomach with water  for 30 minutes.  No antacids, calcium ,  magnesium  for 4 hours. Avoid biotin. 90 tablet 1   lidocaine  (LIDODERM ) 5 % Place 1 patch onto the skin daily. Remove & Discard patch within 12 hours or as directed by MD 30 patch 0   meclizine  (ANTIVERT ) 25 MG tablet TAKE 1 TABLET (25 MG TOTAL) BY MOUTH 3 (THREE) TIMES DAILY AS NEEDED FOR DIZZINESS. 90 tablet 0   metFORMIN  (GLUCOPHAGE -XR) 500 MG 24 hr tablet TAKE 1 TABLET BY MOUTH EVERY DAY WITH BREAKFAST 90 tablet 0   mupirocin ointment (BACTROBAN) 2 % Apply 1 Application topically 3 (three) times daily as needed (lichen planus).     neomycin -polymyxin-hydrocortisone (CORTISPORIN) OTIC solution Place 3 drops into both ears daily as needed (ear inflammation).     omeprazole  (PRILOSEC) 40 MG capsule Take 1 capsule (40 mg total) by mouth daily. 90 capsule 1   OVER THE COUNTER MEDICATION Place 1 drop under the tongue daily. D3 5000 units - K2 120 mcg     oxybutynin  (DITROPAN ) 5 MG tablet TAKE 1 TABLET 2 X /DAY FR EXCESSIVE SWEATING 180 tablet 1   polyethylene glycol (MIRALAX  / GLYCOLAX ) 17 g packet Take 17-51 g by mouth 3 (three) times daily. Take 3 doses daily     Rectal Protectant-Emollient (CALMOL-4) 76-10 % SUPP Use as directed once to twice daily     rOPINIRole  (REQUIP ) 2 MG tablet Take 1 tablet (2 mg total) by mouth 2 (two) times daily AND 2 tablets (4 mg total) at bedtime for restless legs 360 tablet 3   tizanidine  (ZANAFLEX ) 2 MG capsule Take 1 capsule (2 mg total) by mouth at bedtime for muscle spasm. 90 capsule 3   traMADol  (ULTRAM ) 50 MG tablet Take 1-2 tablets (50-100 mg total) by mouth every 6 (six) hours as needed (for SEVERE pain ONLY.). NO REFILLS 30 tablet 0   Vedolizumab  (ENTYVIO  PEN) 108 MG/0.68ML SOAJ Inject 108 mg into the skin every 14 (fourteen) days. Inject at Week 6 (4 weeks after Week 2 infusion) then every 2 weeks thereafter 1.36 mL 1   Vitamin D -Vitamin K (K2-D3 5000 PO) Take 5,000 Units by mouth daily.     No facility-administered medications prior to visit.       Review of Systems  Review of Systems   Physical Exam  There were no vitals taken for this visit. Physical Exam  ***  Lab Results:  CBC    Component Value Date/Time   WBC 5.8 03/23/2024 0840   RBC 4.59 03/23/2024 0840   HGB 14.1 03/23/2024 0840   HGB 12.9 10/20/2012 1358   HCT 42.6 03/23/2024 0840   HCT 40.2 10/20/2012 1358   PLT 292.0 03/23/2024 0840   PLT 265 10/20/2012 1358   MCV 92.8 03/23/2024 0840   MCV 82.1 10/20/2012 1358   MCH 31.9 10/23/2023 1829   MCHC 33.1 03/23/2024 0840   RDW 13.1 03/23/2024 0840   RDW 16.9 (H) 10/20/2012 1358   LYMPHSABS 1.4 03/23/2024 0840   LYMPHSABS 1.3 10/20/2012 1358   MONOABS 0.5 03/23/2024 0840   MONOABS 0.6 10/20/2012 1358   EOSABS 0.2 03/23/2024 0840   EOSABS 0.1 10/20/2012 1358   BASOSABS 0.1 03/23/2024 0840   BASOSABS 0.1 10/20/2012 1358    BMET    Component Value Date/Time   NA 141 03/23/2024 0840   NA 142 03/28/2017 0000   NA 141 10/20/2012 1358   K 4.0 03/23/2024 0840   K 3.9 10/20/2012 1358   CL 101 03/23/2024 0840   CL 104 10/20/2012 1358  CO2 31 03/23/2024 0840   CO2 27 10/20/2012 1358   GLUCOSE 115 (H) 03/23/2024 0840   GLUCOSE 109 (H) 10/20/2012 1358   BUN 16 03/23/2024 0840   BUN 14 03/28/2017 0000   BUN 15.6 10/20/2012 1358   CREATININE 0.88 03/23/2024 0840   CREATININE 1.03 (H) 05/18/2023 1528   CREATININE 1.1 10/20/2012 1358   CALCIUM  9.3 03/23/2024 0840   CALCIUM  7.8 (L) 03/26/2017 0354   CALCIUM  9.7 10/20/2012 1358   GFRNONAA >60 10/23/2023 1829   GFRNONAA 80 07/04/2020 1457   GFRAA 93 07/04/2020 1457    BNP    Component Value Date/Time   BNP 35.6 10/23/2023 1829    ProBNP No results found for: PROBNP  Imaging: MR CERVICAL SPINE WO CONTRAST Result Date: 07/16/2024 MR CERVICAL SPINE WITHOUT CONTRAST CLINICAL HISTORY:  Neck pain. TECHNIQUE: Multiplanar, multi-weighted MRI of the cervical spine without contrast. COMPARISON: MRI from July 10, 2020 FINDINGS: There is  postoperative change from C3 to C7 related to anterior cervical fusion and intervertebral disc hardware placement with bony continuity across the disc spaces. Congenital fusion at C2-3 along the posterior elements. No fracture or vertebral body height loss. No significant bone marrow edema within the limitations of artifact. 2 mm anterolisthesis at C7-T1. Cord signal and craniocervical junction are unremarkable. C2-C3: Congenital fusion. No disc bulge, central canal stenosis, or neuroforaminal stenosis. C3-C4: Postoperative change. No disc bulge, central canal stenosis, or neuroforaminal stenosis. C4-C5: Postoperative change. Patent canal. Uncovertebral hypertrophy causes mild right neuroforaminal stenosis. C5-C6: Postoperative change. No disc bulge, central canal stenosis, or neuroforaminal stenosis. C6-C7: Postoperative change. No disc bulge or herniation. Patent canal. Uncovertebral hypertrophy. Mild bilateral neuroforaminal stenosis. C7-T1: No disc bulge, central canal stenosis, or neuroforaminal stenosis. Moderate bilateral facet arthropathy. Shallow disc bulges at T1-T2 3 are partially included on sagittal imaging without high-grade stenosis. This is stable. IMPRESSION: 1.  Stable postoperative change from C3 to C7 with bony fusion. 2. Shallow disc bulges in the upper thoracic spine, partially included on sagittal imaging. Electronically signed by: Venetia Neer MD 07/16/2024 06:14 AM EST RP Workstation: [id number]   MR LUMBAR SPINE WO CONTRAST Result Date: 07/16/2024 MR LUMBAR SPINE WITHOUT CONTRAST HISTORY: Lower back pain TECHNIQUE: Multiplanar, multi-sequence imaging of the lumbar spine was performed without contrast. COMPARISON: May 28, 2021 FINDINGS: There is postoperative change from L3 to S1 with transpedicular fixation from L3 to L5 and intervertebral disc hardware at L4-5 and L5-S1. There is 8 mm anterolisthesis at L5-S1. There is bony continuity across the disc space at L4-5. Moderate to  severe disc height loss is seen at L3-4. There is 1 to 2 mm retrolisthesis at L2-3. There is anterior spurring within the lower thoracic spine with mild disc height loss. Moderate 1 edema is seen along the opposing endplates at T9-10. At T9-10, T10-11, and T11-T12, there are circumferential disc bulges without high-grade stenosis. T12-L1: Circumferential disc bulge without significant canal or foraminal stenosis. Mild bilateral facet arthropathy with bilateral facet joint effusions. L1-2: Generalized posterior disc bulge without significant canal or foraminal stenosis. Mild bilateral facet arthropathy. L2-3: Circumferential disc bulge causes mild bilateral neuroforaminal stenosis and mild central canal stenosis, mildly progressed. Moderate bilateral facet arthropathy with right facet joint effusion. L3-4: Interval postoperative change. Shallow posterior disc bulge without significant canal or foraminal stenosis. L4-5: Postoperative change. No disc bulge, central canal stenosis, or neuroforaminal stenosis. L5-S1: Postoperative change. Grade 1-2 anterolisthesis causes mild bilateral neuroforaminal stenosis. IMPRESSION: 1. Postoperative change from L3 to S1 as described. Previously  noted stenosis at L3-4 has improved. 2. Mildly increased narrowing at L2-3 with mild central canal stenosis and mild bilateral neuroforaminal stenosis. Electronically signed by: Venetia Neer MD 07/16/2024 06:06 AM EST RP Workstation: [id number]     Assessment & Plan:   No problem-specific Assessment & Plan notes found for this encounter.   1. Obstructive sleep apnea (Primary)   Assessment and Plan Assessment & Plan       I personally spent a total of *** minutes in the care of the patient today including {Time Based Coding:210964241}.   Almarie LELON Ferrari, NP 07/27/2024    [1]  Allergies Allergen Reactions   Methocarbamol  Shortness Of Breath, Nausea Only and Other (See Comments)    Other reaction(s): Dizziness,  psychological reaction, disoriented Sleepy and weight loss    Codeine Other (See Comments)    Headache   Statins Other (See Comments)    Extreme pain   Azathioprine    Other Other (See Comments)    MINT=sore mouth and stomach upset   Oxycodone      Dizziness, unbalanced.   Amitriptyline Other (See Comments)    Confusion and hallucinations   Cephalexin  Dermatitis    blisters   Erythromycin Base Rash    Flushing/red face.   Fish Oil Nausea Only   Flax Seed [Flax Seed Oil] Rash   Flaxseed (Linseed) Rash   Hydrocodone  Itching and Rash        Morphine Nausea And Vomiting and Rash   Nsaids Nausea Only   Nuvigil [Armodafinil] Other (See Comments)    Unspecified   Sertraline Rash   Sinemet [Carbidopa-Levodopa] Rash   Sulfa Antibiotics Rash   Tolmetin Nausea Only  [2]  Social History Tobacco Use  Smoking Status Never   Passive exposure: Past  Smokeless Tobacco Never   "

## 2024-07-27 NOTE — Progress Notes (Signed)
 Needs refill tessalon  perles

## 2024-07-28 ENCOUNTER — Telehealth: Payer: Self-pay | Admitting: Primary Care

## 2024-07-28 ENCOUNTER — Encounter: Payer: PRIVATE HEALTH INSURANCE | Admitting: Primary Care

## 2024-07-28 DIAGNOSIS — G4733 Obstructive sleep apnea (adult) (pediatric): Secondary | ICD-10-CM

## 2024-07-28 NOTE — Telephone Encounter (Signed)
 Spoke with patient yesterday and confirmed apt for 07/28/24, however, she no showed for her visit today likely due to weather.   Needs to be scheduled for inspire clinic with me on Feb 6th or another provider on an inspire day.

## 2024-07-28 NOTE — Telephone Encounter (Signed)
 Contacted patient, had a flat tire- rescheduled to 08/05/2024 with Beth at 10am. Nothing further needed

## 2024-07-31 ENCOUNTER — Other Ambulatory Visit: Payer: Self-pay | Admitting: Podiatry

## 2024-08-01 ENCOUNTER — Telehealth: Payer: Self-pay

## 2024-08-01 ENCOUNTER — Other Ambulatory Visit (HOSPITAL_COMMUNITY): Payer: Self-pay

## 2024-08-01 ENCOUNTER — Encounter: Payer: Self-pay | Admitting: Gastroenterology

## 2024-08-01 NOTE — Telephone Encounter (Signed)
 Pharmacy Patient Advocate Encounter   Received notification from xxx that prior authorization for Entyvio  Pen 108MG /0.68ML auto-injectors is required/requested.   Insurance verification completed.   The patient is insured through [facility].   Per test claim: PA required; PA started via CoverMyMeds. KEY [id number] . Waiting for clinical questions to populate.   *Resubmission being performed due to patient having new insurance as of beginning of year. Insurance shows must fill at outside specialty pharmacy.

## 2024-08-03 ENCOUNTER — Other Ambulatory Visit (HOSPITAL_COMMUNITY): Payer: Self-pay

## 2024-08-05 ENCOUNTER — Encounter: Payer: Self-pay | Admitting: Primary Care

## 2024-08-05 ENCOUNTER — Other Ambulatory Visit (HOSPITAL_COMMUNITY): Payer: Self-pay

## 2024-08-05 ENCOUNTER — Encounter: Payer: Self-pay | Admitting: Gastroenterology

## 2024-08-05 ENCOUNTER — Ambulatory Visit: Payer: PRIVATE HEALTH INSURANCE | Admitting: Primary Care

## 2024-08-05 VITALS — BP 147/92 | HR 83 | Ht 63.0 in | Wt 149.2 lb

## 2024-08-05 DIAGNOSIS — G4733 Obstructive sleep apnea (adult) (pediatric): Secondary | ICD-10-CM

## 2024-08-05 DIAGNOSIS — Z23 Encounter for immunization: Secondary | ICD-10-CM

## 2024-08-05 DIAGNOSIS — G510 Bell's palsy: Secondary | ICD-10-CM

## 2024-08-05 DIAGNOSIS — Z0000 Encounter for general adult medical examination without abnormal findings: Secondary | ICD-10-CM

## 2024-08-05 NOTE — Progress Notes (Unsigned)
 "  @Patient  ID: Crystal [name], female    DOB: 07/30/1950, 74 y.o.   MRN: [id number]  Chief Complaint  Patient presents with   Obstructive Sleep Apnea    F/U for INSPIRE    Referring provider: Tonita Fallow, MD  HPI: 74 year old female, never smoked.  Past medical history significant for hypertension, aortic arthrosclerosis, OSA, bronchiectasis without complication, allergic rhinitis, Crohn's, GERD, chronic kidney disease stage II, arthropathy involving shoulder region, COVID-19 virus, recurrent TIAs, right total knee arthroplasty, lumbar spine stenosis, obesity.  Previous [facility] pulmonary encounter:  05/05/2023 Former patient of Dr. Shellia.  Followed by our office for obstructive sleep apnea, allergic rhinitis and bronchiectasis without complication. Patient was intolerant to CPAP and has inspire device (hypoglossal nerve stimulator).    Discussed the use of AI scribe software for clinical note transcription with the patient, who gave verbal consent to proceed.  History of Present Illness   Patient presents today for surgical risk assessment for left should surgery revision, tentatively scheduled for 1 January. She has a history of sleep apnea managed with an Inspire device, allergic rhinitis, and bronchiectasis.   She is tolerating inspire well.  She is 80% compliant with usage greater than 4 hours over the last 30 days.  Average pauses per night 0.0.  Current amplitude 2.0 voltage (level 4).  She had COVID-19 in August which has left her feeling fatigued. She also reports increased difficulty breathing, which she attributes to allergies or sensitivities. She has been taking an allergy  pill, possibly Allegra , and using Flonase  once daily, but reports these measures are not providing sufficient relief.  She reports that she stays stocked up, suggesting nasal congestion. The patient has a history of receiving allergy  shots and has a deviated septum from frequent falls.  The  patient's fatigue is not attributed to sleep apnea, as her Inspire device download shows no pauses in breathing. The patient is on level four of the Inspire device, which is just below the highest level.   The patient is not on oxygen  or blood thinners and has no history of blood clots.    07/14/2023 Patient presents today for OSA/Inspire follow-up.   Discussed the use of AI scribe software for clinical note transcription with the patient, who gave verbal consent to proceed.  History of Present Illness   Hx sleep apnea, intolerant to CPAP. Patient uses Inspire device consistently. She is 80% compliant with usage greater than 4 hours over the last 30 days (06/10/23-07/09/23).  Average usage 8 hours 15 mins. Average pauses per night 0.0. Current amplitude 2.0 voltage (level 5).  She reports sporadic insomnia, occurring approximately once or twice a month, which she attributes to an overactive mind. She denies any correlation between these episodes and her Inspire device.  Despite these occasional sleep disturbances, the patient expresses confidence in her Inspire device, noting improved sleep quality and reduced nocturnal awakenings. However, she reports an increase in sleep talking, which does not disrupt her own sleep but does affect her partner.  The patient also describes an incident where she fell and hit her head during a daytime nap, during which the Inspire device was not in use. She recalls acting out her dreams, including making movements in the air, which she realizes upon waking were not interacting with anything tangible.  The patient also mentions an upcoming surgery for a shoulder condition. She expresses concern about a persistent discomfort in her back, which started during a bout of COVID-19 before Labor Day and has not  completely resolved. The discomfort is located in the back and radiates across the body, and is exacerbated by breathing. The patient is unsure whether this discomfort  is due to a muscle pull, a kidney issue, or another cause. She has no significant cough.   The patient is currently using her Inspire device at a level five, one below her highest possible setting. She reports that this level is comfortable, but notes that her tongue is constantly moving and does not settle down. She also mentions that she often wakes up in the morning with the device already off, and sometimes forgets to pause it when she wakes up in the middle of the night.          08/05/2024- Interim hx  Discussed the use of AI scribe software for clinical note transcription with the patient, who gave verbal consent to proceed.  History of Present Illness   Discussed the use of AI scribe software for clinical note transcription with the patient, who gave verbal consent to proceed.  History of Present Illness    Sleep study interpreted: severe OSA and sleep-related hypoxemia that appears somewhat out of proportion to the severity of the patient's sleep apnea was identified. Cc'ing ordering provider.       Allergies[1]  Immunization History  Administered Date(s) Administered   DT (Pediatric) 01/03/2015   Fluad Quad(high Dose 65+) 04/25/2022, 05/01/2023   Fluad Trivalent(High Dose 65+) 02/19/2018   INFLUENZA, HIGH DOSE SEASONAL PF 03/13/2015, 07/21/2019, 02/16/2020, 04/08/2021   Influenza Split 03/30/2010, 03/31/2011, 05/14/2014, 02/27/2016, 04/16/2018   Influenza Whole 03/30/2010   Influenza, Quadrivalent, Recombinant, Inj, Pf 03/17/2024   Influenza,trivalent, recombinat, inj, PF 03/17/2024   Influenza-Unspecified 03/31/2011, 05/14/2014, 02/27/2016, 04/16/2018   PFIZER Comirnaty(Gray Top)Covid-19 Tri-Sucrose Vaccine 09/19/2019, 10/10/2019, 07/13/2020, 05/01/2023   PFIZER(Purple Top)SARS-COV-2 Vaccination 09/19/2019, 10/10/2019   Pfizer Covid-19 Vaccine Bivalent Booster 29yrs & up 05/14/2021   Pneumococcal Conjugate-13 06/30/2005, 04/17/2015, 01/23/2019   Pneumococcal  Polysaccharide-23 01/06/2017   Pneumococcal-Unspecified 06/30/2005   Respiratory Syncytial Virus Vaccine,Recomb Aduvanted(Arexvy) 07/01/2022   Td 06/30/2004   Td (Adult),5 Lf Tetanus Toxid, Preservative Free 06/30/2004   Td,absorbed, Preservative Free, Adult Use, Lf Unspecified 06/30/2004   Unspecified SARS-COV-2 Vaccination 04/25/2022   Zoster Recombinant(Shingrix) 05/14/2021, 08/22/2021   Zoster, Live 07/01/2007    Past Medical History:  Diagnosis Date   Allergy  1966   Anemia    Anxiety 2024   Nerve pain   Arthritis 2003   Rhuematologist   ASCVD (arteriosclerotic cardiovascular disease)    Blood transfusion without reported diagnosis    Hospital-surgery   Cataract 2000s   Surgery   Chronic kidney disease 2020s   [facility]   Closed nondisplaced subtrochanteric fracture of left femur (HCC) 09/03/2017   September 2018.  Status post surgery by Dr. Celena   Crohn's disease of ileum without complication [facility])    DDD (degenerative disc disease), lumbar    Depression 11/27/2010   Son KIA   Dyspnea    Eye infection    Fibromyalgia    Gait disorder 10/26/2012   GERD (gastroesophageal reflux disease) 2009   Gastroenterologist   GI bleed    Glaucoma 2000s   Stable   Hyperlipidemia    Hypertension    Meds   Hypothyroid    Neuromuscular disorder (HCC) 2013   Bells Palsy- right side of face affected   Periprosthetic supracondylar fracture of femur, left  03/23/2017   Pneumonia    Pre-diabetes    Restless leg syndrome    Rhinitis  06/25/2010   Seizures (HCC) ?   TIA   Sleep apnea    does not use CPAP   Spinal headache    with the C section   Stroke (HCC)    Hx TIAs x 3   last was around 2014   TIA (transient ischemic attack)    3         Ulcer    One   Vitamin D  deficiency     Tobacco History: Tobacco Use History[2] Counseling given: Not Answered   Outpatient Medications Prior to Visit  Medication Sig Dispense Refill   acyclovir  (ZOVIRAX ) 400  MG tablet TAKE 1 TABLET BY MOUTH EVERY DAY 90 tablet 1   benzonatate  (TESSALON ) 200 MG capsule Take 1 capsule (200 mg total) by mouth 2 (two) times daily as needed for cough. 30 capsule 0   bisoprolol -hydrochlorothiazide  (ZIAC ) 10-6.25 MG tablet TAKE 1 TABLET BY MOUTH EVERY DAY 90 tablet 1   Black Pepper-Turmeric (TURMERIC PLUS BLACK PEPPER EXT PO) Take 1 tablet by mouth daily.     buPROPion  (WELLBUTRIN  XL) 300 MG 24 hr tablet Take 1 tablet (300 mg total) by mouth daily for mood, focus & concentration. 90 tablet 0   carboxymethylcellulose (REFRESH PLUS) 0.5 % SOLN Place 1 drop into both eyes 3 (three) times daily as needed (dry eyes).     dexamethasone  (DECADRON ) 0.5 MG/5ML solution Take 5 mLs (0.5 mg total) by mouth 3 (three) times daily. 100 mL 1   diclofenac  Sodium (VOLTAREN ) 1 % GEL Apply 2 grams topically daily as needed (pain). 100 g 1   DULoxetine  (CYMBALTA ) 60 MG capsule Take 1 capsule (60 mg total) by mouth daily for chronic pain. 90 capsule 3   fenofibrate  micronized (LOFIBRA) 134 MG capsule Take 1 capsule (134 mg total) by mouth daily before breakfast 90 capsule 3   Ferrous Sulfate (IRON PO) Take 25 mg by mouth daily.     fluticasone  (FLONASE ) 50 MCG/ACT nasal spray Use  1-2 Sprays  each nostril   1-2 x /day  as needed 16 g 5   gabapentin  (NEURONTIN ) 300 MG capsule Take  1 capsule  3 x /day  or Chronic Pain    (Dx: m79.7)                                               /                                TAKE                                   BY                                  MOUTH     Glycerin, Laxative, (FLEET LIQUID GLYCERIN SUPP RE) Place 1 Dose rectally daily as needed (Constipation).     levothyroxine  (SYNTHROID ) 88 MCG tablet Take 1 tablet (88 mcg total) by mouth daily on an empty stomach with water  for 30 minutes.  No antacids, calcium , magnesium  for 4 hours. Avoid biotin. 90 tablet 1   lidocaine  (LIDODERM ) 5 % Place 1 patch onto the skin daily.  Remove & Discard patch within 12 hours  or as directed by MD 30 patch 0   meclizine  (ANTIVERT ) 25 MG tablet TAKE 1 TABLET (25 MG TOTAL) BY MOUTH 3 (THREE) TIMES DAILY AS NEEDED FOR DIZZINESS. 90 tablet 0   metFORMIN  (GLUCOPHAGE -XR) 500 MG 24 hr tablet TAKE 1 TABLET BY MOUTH EVERY DAY WITH BREAKFAST 90 tablet 0   mupirocin ointment (BACTROBAN) 2 % Apply 1 Application topically 3 (three) times daily as needed (lichen planus).     neomycin -polymyxin-hydrocortisone (CORTISPORIN) OTIC solution Place 3 drops into both ears daily as needed (ear inflammation).     omeprazole  (PRILOSEC) 40 MG capsule Take 1 capsule (40 mg total) by mouth daily. 90 capsule 1   oxybutynin  (DITROPAN ) 5 MG tablet TAKE 1 TABLET 2 X /DAY FR EXCESSIVE SWEATING 180 tablet 1   polyethylene glycol (MIRALAX  / GLYCOLAX ) 17 g packet Take 17-51 g by mouth 3 (three) times daily. Take 3 doses daily     Rectal Protectant-Emollient (CALMOL-4) 76-10 % SUPP Use as directed once to twice daily     rOPINIRole  (REQUIP ) 2 MG tablet Take 1 tablet (2 mg total) by mouth 2 (two) times daily AND 2 tablets (4 mg total) at bedtime for restless legs 360 tablet 3   tizanidine  (ZANAFLEX ) 2 MG capsule Take 1 capsule (2 mg total) by mouth at bedtime for muscle spasm. 90 capsule 3   traMADol  (ULTRAM ) 50 MG tablet Take 1-2 tablets (50-100 mg total) by mouth every 6 (six) hours as needed (for SEVERE pain ONLY.). NO REFILLS 30 tablet 0   Vitamin D -Vitamin K (K2-D3 5000 PO) Take 5,000 Units by mouth daily.     clobetasol ointment (TEMOVATE) 0.05 % Apply 1 Application topically 3 (three) times daily as needed (lichen planus). (Patient not taking: Reported on 08/05/2024)     clotrimazole  (MYCELEX ) 10 MG troche Dissolve 1 tablet (10 mg total) in mouth 3 (three) times daily. 140 Troche 11   gabapentin  (NEURONTIN ) 600 MG tablet TAKE 1 TABLET BY MOUTH AT BEDTIME. (Patient not taking: Reported on 08/05/2024) 30 tablet 5   OVER THE COUNTER MEDICATION Place 1 drop under the tongue daily. D3 5000 units - K2 120 mcg  (Patient not taking: Reported on 08/05/2024)     Vedolizumab  (ENTYVIO  PEN) 108 MG/0.68ML SOAJ Inject 108 mg into the skin every 14 (fourteen) days. Inject at Week 6 (4 weeks after Week 2 infusion) then every 2 weeks thereafter (Patient not taking: Reported on 08/05/2024) 1.36 mL 1   No facility-administered medications prior to visit.      Review of Systems  Review of Systems   Physical Exam  BP (!) 147/92   Pulse 83   Ht 5' 3 (1.6 m)   Wt 149 lb 4 oz (67.7 kg)   SpO2 96% Comment: on RA  BMI 26.44 kg/m  Physical Exam  ***  Lab Results:  CBC    Component Value Date/Time   WBC 5.8 03/23/2024 0840   RBC 4.59 03/23/2024 0840   HGB 14.1 03/23/2024 0840   HGB 12.9 10/20/2012 1358   HCT 42.6 03/23/2024 0840   HCT 40.2 10/20/2012 1358   PLT 292.0 03/23/2024 0840   PLT 265 10/20/2012 1358   MCV 92.8 03/23/2024 0840   MCV 82.1 10/20/2012 1358   MCH 31.9 10/23/2023 1829   MCHC 33.1 03/23/2024 0840   RDW 13.1 03/23/2024 0840   RDW 16.9 (H) 10/20/2012 1358   LYMPHSABS 1.4 03/23/2024 0840   LYMPHSABS 1.3 10/20/2012 1358  MONOABS 0.5 03/23/2024 0840   MONOABS 0.6 10/20/2012 1358   EOSABS 0.2 03/23/2024 0840   EOSABS 0.1 10/20/2012 1358   BASOSABS 0.1 03/23/2024 0840   BASOSABS 0.1 10/20/2012 1358    BMET    Component Value Date/Time   NA 141 03/23/2024 0840   NA 142 03/28/2017 0000   NA 141 10/20/2012 1358   K 4.0 03/23/2024 0840   K 3.9 10/20/2012 1358   CL 101 03/23/2024 0840   CL 104 10/20/2012 1358   CO2 31 03/23/2024 0840   CO2 27 10/20/2012 1358   GLUCOSE 115 (H) 03/23/2024 0840   GLUCOSE 109 (H) 10/20/2012 1358   BUN 16 03/23/2024 0840   BUN 14 03/28/2017 0000   BUN 15.6 10/20/2012 1358   CREATININE 0.88 03/23/2024 0840   CREATININE 1.03 (H) 05/18/2023 1528   CREATININE 1.1 10/20/2012 1358   CALCIUM  9.3 03/23/2024 0840   CALCIUM  7.8 (L) 03/26/2017 0354   CALCIUM  9.7 10/20/2012 1358   GFRNONAA >60 10/23/2023 1829   GFRNONAA 80 07/04/2020 1457    GFRAA 93 07/04/2020 1457    BNP    Component Value Date/Time   BNP 35.6 10/23/2023 1829    ProBNP No results found for: PROBNP  Imaging: MR CERVICAL SPINE WO CONTRAST Result Date: 07/16/2024 MR CERVICAL SPINE WITHOUT CONTRAST CLINICAL HISTORY:  Neck pain. TECHNIQUE: Multiplanar, multi-weighted MRI of the cervical spine without contrast. COMPARISON: MRI from July 10, 2020 FINDINGS: There is postoperative change from C3 to C7 related to anterior cervical fusion and intervertebral disc hardware placement with bony continuity across the disc spaces. Congenital fusion at C2-3 along the posterior elements. No fracture or vertebral body height loss. No significant bone marrow edema within the limitations of artifact. 2 mm anterolisthesis at C7-T1. Cord signal and craniocervical junction are unremarkable. C2-C3: Congenital fusion. No disc bulge, central canal stenosis, or neuroforaminal stenosis. C3-C4: Postoperative change. No disc bulge, central canal stenosis, or neuroforaminal stenosis. C4-C5: Postoperative change. Patent canal. Uncovertebral hypertrophy causes mild right neuroforaminal stenosis. C5-C6: Postoperative change. No disc bulge, central canal stenosis, or neuroforaminal stenosis. C6-C7: Postoperative change. No disc bulge or herniation. Patent canal. Uncovertebral hypertrophy. Mild bilateral neuroforaminal stenosis. C7-T1: No disc bulge, central canal stenosis, or neuroforaminal stenosis. Moderate bilateral facet arthropathy. Shallow disc bulges at T1-T2 3 are partially included on sagittal imaging without high-grade stenosis. This is stable. IMPRESSION: 1.  Stable postoperative change from C3 to C7 with bony fusion. 2. Shallow disc bulges in the upper thoracic spine, partially included on sagittal imaging. Electronically signed by: Venetia Neer MD 07/16/2024 06:14 AM EST RP Workstation: [id number]   MR LUMBAR SPINE WO CONTRAST Result Date: 07/16/2024 MR LUMBAR SPINE WITHOUT CONTRAST  HISTORY: Lower back pain TECHNIQUE: Multiplanar, multi-sequence imaging of the lumbar spine was performed without contrast. COMPARISON: May 28, 2021 FINDINGS: There is postoperative change from L3 to S1 with transpedicular fixation from L3 to L5 and intervertebral disc hardware at L4-5 and L5-S1. There is 8 mm anterolisthesis at L5-S1. There is bony continuity across the disc space at L4-5. Moderate to severe disc height loss is seen at L3-4. There is 1 to 2 mm retrolisthesis at L2-3. There is anterior spurring within the lower thoracic spine with mild disc height loss. Moderate 1 edema is seen along the opposing endplates at T9-10. At T9-10, T10-11, and T11-T12, there are circumferential disc bulges without high-grade stenosis. T12-L1: Circumferential disc bulge without significant canal or foraminal stenosis. Mild bilateral facet arthropathy with bilateral facet joint effusions.  L1-2: Generalized posterior disc bulge without significant canal or foraminal stenosis. Mild bilateral facet arthropathy. L2-3: Circumferential disc bulge causes mild bilateral neuroforaminal stenosis and mild central canal stenosis, mildly progressed. Moderate bilateral facet arthropathy with right facet joint effusion. L3-4: Interval postoperative change. Shallow posterior disc bulge without significant canal or foraminal stenosis. L4-5: Postoperative change. No disc bulge, central canal stenosis, or neuroforaminal stenosis. L5-S1: Postoperative change. Grade 1-2 anterolisthesis causes mild bilateral neuroforaminal stenosis. IMPRESSION: 1. Postoperative change from L3 to S1 as described. Previously noted stenosis at L3-4 has improved. 2. Mildly increased narrowing at L2-3 with mild central canal stenosis and mild bilateral neuroforaminal stenosis. Electronically signed by: Venetia Neer MD 07/16/2024 06:06 AM EST RP Workstation: [id number]     Assessment & Plan:   No problem-specific Assessment & Plan notes found for this  encounter.   1. Obstructive sleep apnea (Primary)  2. OSA (obstructive sleep apnea) - Home sleep test  3. Bell's palsy - Ambulatory referral to Neurology   Assessment and Plan Assessment & Plan       I personally spent a total of *** minutes in the care of the patient today including {Time Based Coding:[id number]}.   Crystal LELON Ferrari, NP 08/05/2024     [1]  Allergies Allergen Reactions   Methocarbamol  Shortness Of Breath, Nausea Only and Other (See Comments)    Other reaction(s): Dizziness, psychological reaction, disoriented Sleepy and weight loss    Codeine Other (See Comments)    Headache   Statins Other (See Comments)    Extreme pain   Azathioprine    Other Other (See Comments)    MINT=sore mouth and stomach upset   Oxycodone      Dizziness, unbalanced.   Amitriptyline Other (See Comments)    Confusion and hallucinations   Cephalexin  Dermatitis    blisters   Erythromycin Base Rash    Flushing/red face.   Fish Oil Nausea Only   Flax Seed [Flax Seed Oil] Rash   Flaxseed (Linseed) Rash   Hydrocodone  Itching and Rash        Morphine Nausea And Vomiting and Rash   Nsaids Nausea Only   Nuvigil [Armodafinil] Other (See Comments)    Unspecified   Sertraline Rash   Sinemet [Carbidopa-Levodopa] Rash   Sulfa Antibiotics Rash   Tolmetin Nausea Only  [2]  Social History Tobacco Use  Smoking Status Never   Passive exposure: Past  Smokeless Tobacco Never   "

## 2024-08-05 NOTE — Patient Instructions (Addendum)
" °  VISIT SUMMARY: During your visit, we discussed your experience with the Inspire device for your obstructive sleep apnea, including some issues you've been having with the device and your overall health. We reviewed your recent sleep study results and made adjustments to your treatment plan to help improve your sleep quality.  YOUR PLAN: -OBSTRUCTIVE SLEEP APNEA: Obstructive sleep apnea is a condition where your airway becomes blocked during sleep, causing breathing interruptions. We increased the Inspire device amplitude to level 6 and extended the start delay to 45 minutes to help with your discomfort. We also ordered a repeat home sleep study before your next follow-up in six months and referred you to a new sleep doctor for ongoing management. Additionally, we advised you to pause the Inspire device when taking medications to prevent aspiration.  INSTRUCTIONS: Please follow up with the new sleep doctor, either Dr. Valorie or Dr. Theodoro, for ongoing management of your sleep apnea. Also, complete the repeat home sleep study before your next follow-up appointment in six months.  Orders: Referral to neurology [facility] lab testing Prevnar 20   Follow-up 6 months INSPIRE SLOT with Dr. Neda or Pawar (Home sleep test prior) "

## 2024-08-05 NOTE — Telephone Encounter (Signed)
 Pharmacy Patient Advocate Encounter  Received notification from [facility] that Prior Authorization for Entyvio  Pen 108MG /0.68ML auto-injectors has been APPROVED from 08-03-2024 to 08-03-2025   PA #/Case ID/Reference #: [id number]    *Must fill at [facility]

## 2024-09-16 ENCOUNTER — Ambulatory Visit: Admitting: Family Medicine
